# Patient Record
Sex: Female | Born: 1965 | Race: Black or African American | Hispanic: No | Marital: Married | Smoking: Former smoker
Health system: Southern US, Community
[De-identification: ages and names within clinical notes are randomized; demographics above are authoritative.]

## PROBLEM LIST (undated history)

## (undated) DIAGNOSIS — C44201 Unspecified malignant neoplasm of skin of unspecified ear and external auricular canal: Secondary | ICD-10-CM

## (undated) DIAGNOSIS — I639 Cerebral infarction, unspecified: Secondary | ICD-10-CM

## (undated) DIAGNOSIS — F419 Anxiety disorder, unspecified: Secondary | ICD-10-CM

## (undated) DIAGNOSIS — F32A Depression, unspecified: Secondary | ICD-10-CM

## (undated) DIAGNOSIS — D649 Anemia, unspecified: Secondary | ICD-10-CM

## (undated) DIAGNOSIS — M1990 Unspecified osteoarthritis, unspecified site: Secondary | ICD-10-CM

## (undated) DIAGNOSIS — J45909 Unspecified asthma, uncomplicated: Secondary | ICD-10-CM

## (undated) DIAGNOSIS — I10 Essential (primary) hypertension: Secondary | ICD-10-CM

## (undated) DIAGNOSIS — E119 Type 2 diabetes mellitus without complications: Secondary | ICD-10-CM

## (undated) DIAGNOSIS — J449 Chronic obstructive pulmonary disease, unspecified: Secondary | ICD-10-CM

## (undated) DIAGNOSIS — G4730 Sleep apnea, unspecified: Secondary | ICD-10-CM

## (undated) DIAGNOSIS — N289 Disorder of kidney and ureter, unspecified: Secondary | ICD-10-CM

## (undated) DIAGNOSIS — N281 Cyst of kidney, acquired: Secondary | ICD-10-CM

## (undated) DIAGNOSIS — K760 Fatty (change of) liver, not elsewhere classified: Secondary | ICD-10-CM

## (undated) HISTORY — PX: DILATION AND CURETTAGE OF UTERUS: SHX78

## (undated) HISTORY — PX: BACK SURGERY: SHX140

## (undated) HISTORY — PX: TUBAL LIGATION: SHX77

## (undated) HISTORY — PX: ENDOMETRIAL BIOPSY: SHX622

## (undated) HISTORY — PX: ABDOMINAL HYSTERECTOMY: SHX81

---
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
## 1990-11-22 HISTORY — PX: EXPLORATORY LAPAROTOMY: SUR591

## 1996-11-22 HISTORY — PX: HAND SURGERY: SHX662

## 2004-09-17 ENCOUNTER — Ambulatory Visit: Payer: Self-pay | Admitting: Orthopedic Surgery

## 2005-01-23 ENCOUNTER — Emergency Department: Payer: Self-pay | Admitting: General Practice

## 2005-02-15 ENCOUNTER — Emergency Department: Payer: Self-pay | Admitting: General Practice

## 2005-02-16 ENCOUNTER — Ambulatory Visit: Payer: Self-pay | Admitting: General Practice

## 2005-02-18 ENCOUNTER — Emergency Department: Payer: Self-pay | Admitting: Emergency Medicine

## 2005-02-24 ENCOUNTER — Ambulatory Visit: Payer: Self-pay

## 2005-04-30 ENCOUNTER — Emergency Department: Payer: Self-pay | Admitting: General Practice

## 2005-07-22 ENCOUNTER — Ambulatory Visit: Payer: Self-pay

## 2005-11-11 ENCOUNTER — Emergency Department: Payer: Self-pay | Admitting: Emergency Medicine

## 2005-11-29 ENCOUNTER — Emergency Department: Payer: Self-pay | Admitting: Unknown Physician Specialty

## 2005-11-29 IMAGING — US US EXTREM LOW VENOUS*R*
1 series · 17 of 24 positions shown · non-contrast
Comparison: none

REASON FOR EXAM: Right calf pain
COMMENTS:

[Series 1: us extrem low venous*right* · 17 of 27 slices shown]
[im 1/27]
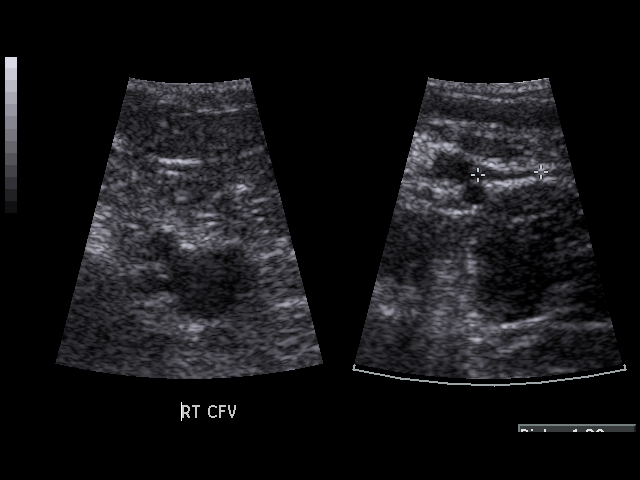
[im 3/27]
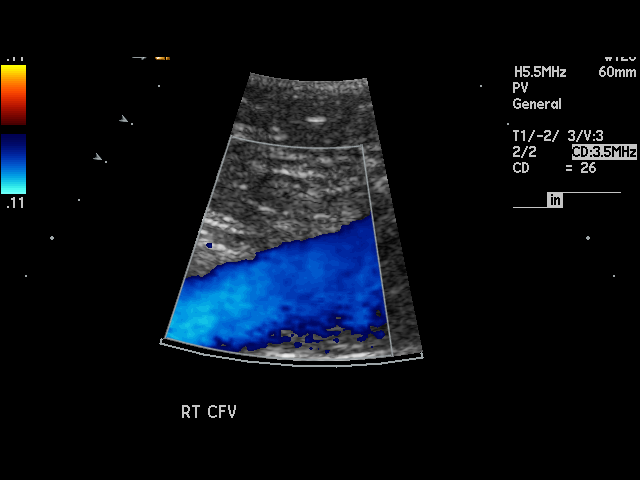
[im 4/27]
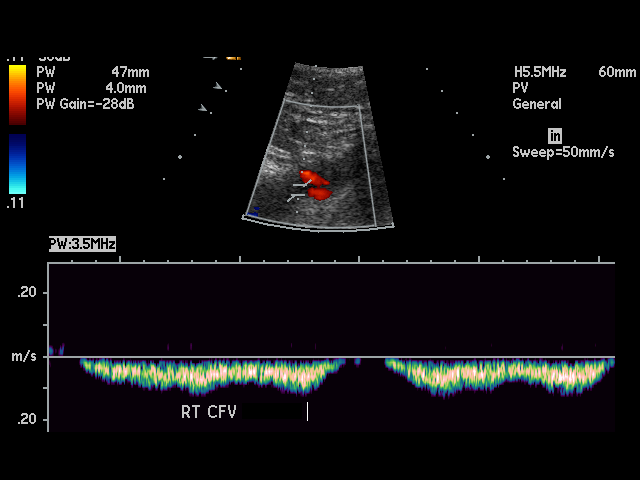
[im 5/27]
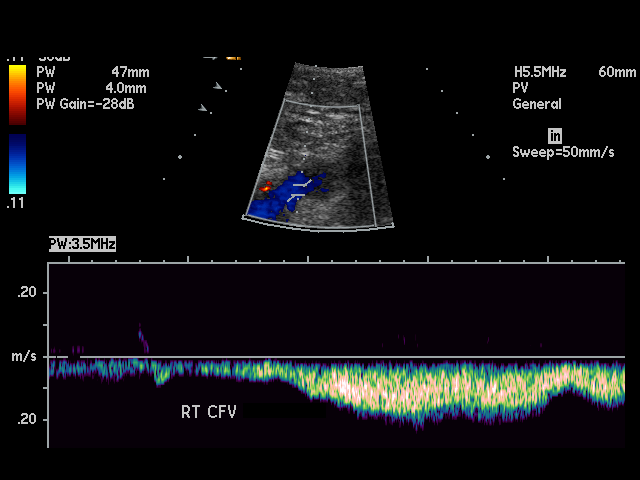
[im 7/27]
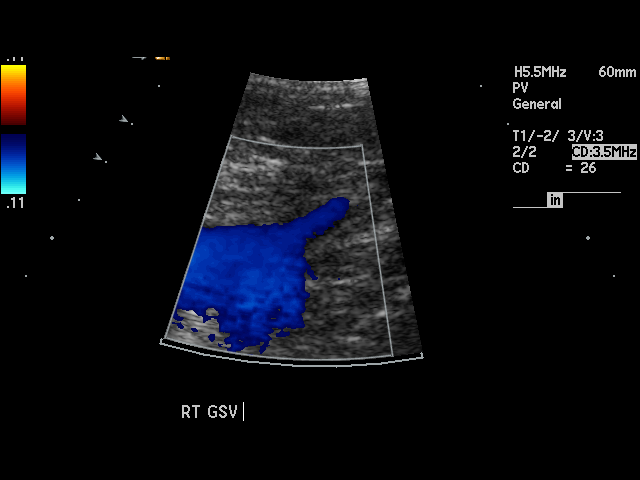
[im 8/27]
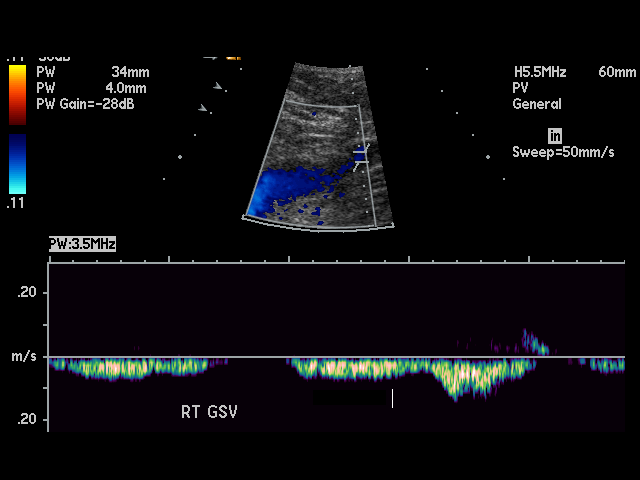
[im 11/27]
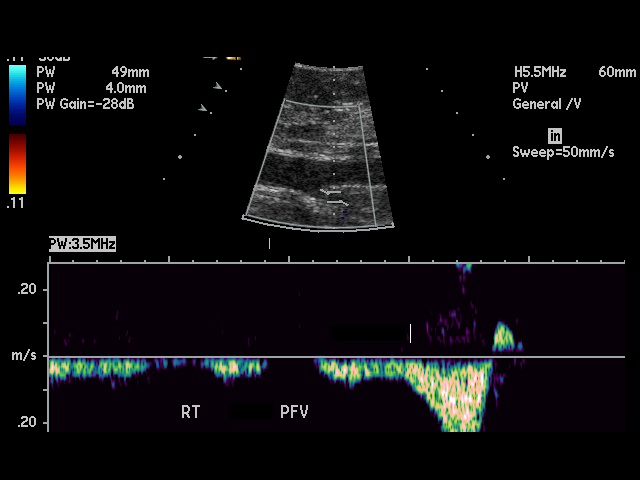
[im 12/27]
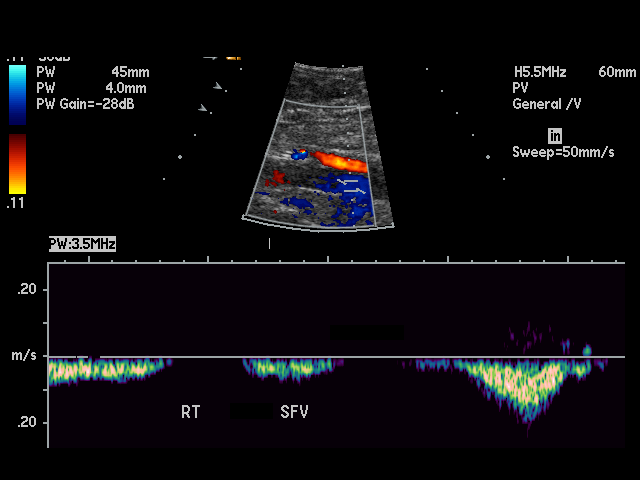
[im 14/27]
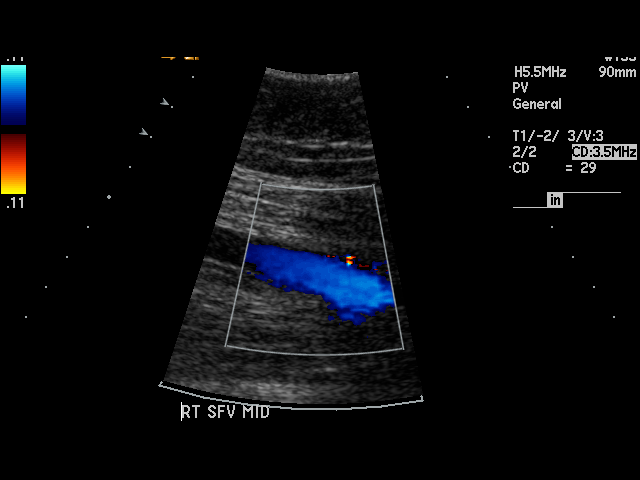
[im 15/27]
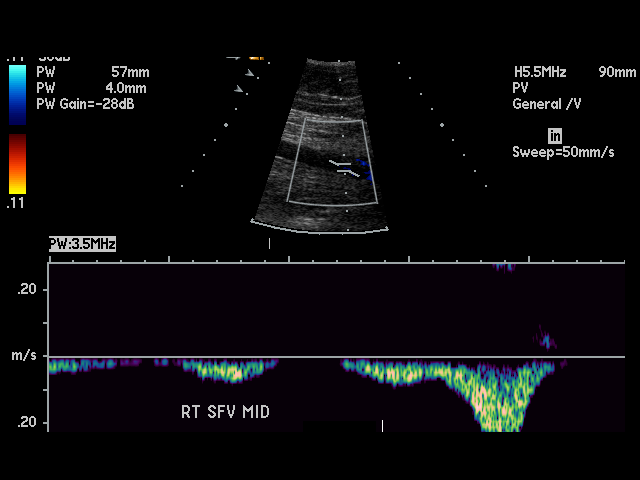
[im 16/27]
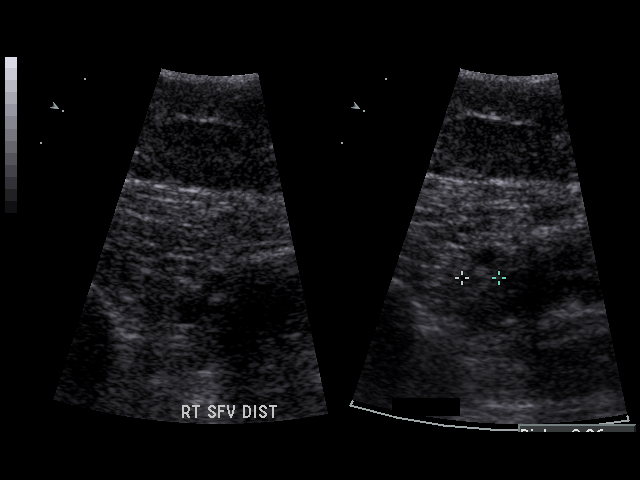
[im 19/27]
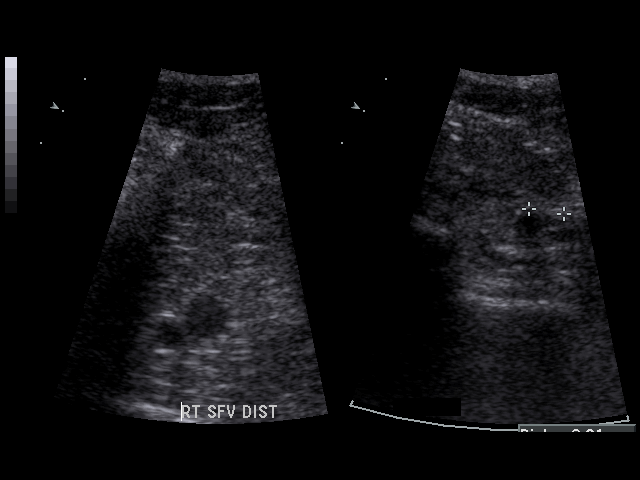
[im 20/27]
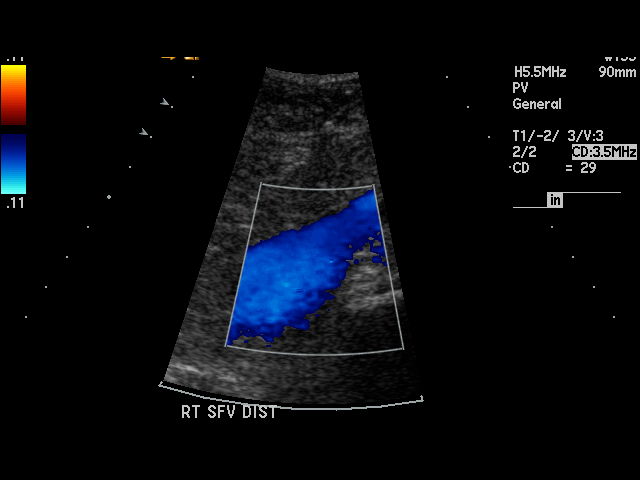
[im 22/27]
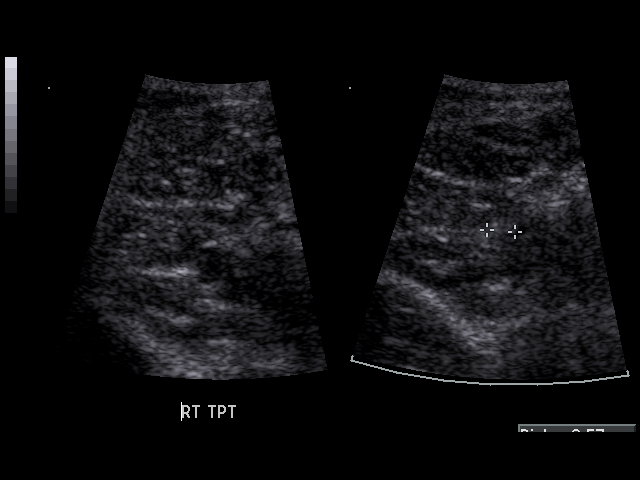
[im 23/27]
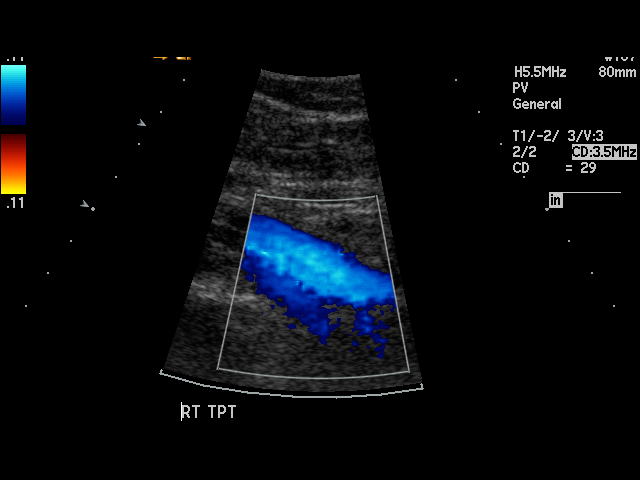
[im 24/27]
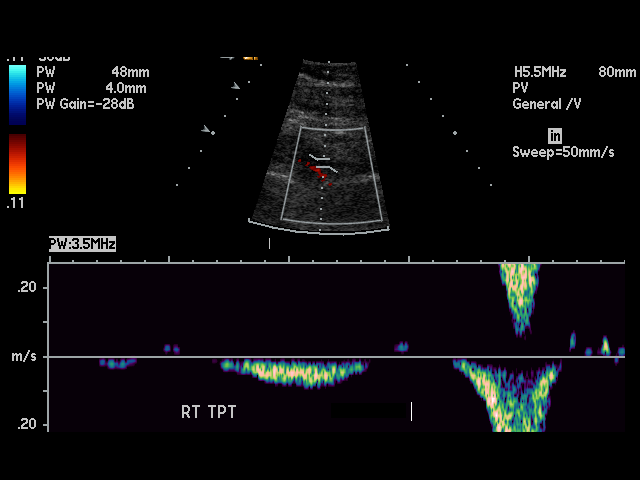
[im 27/27]
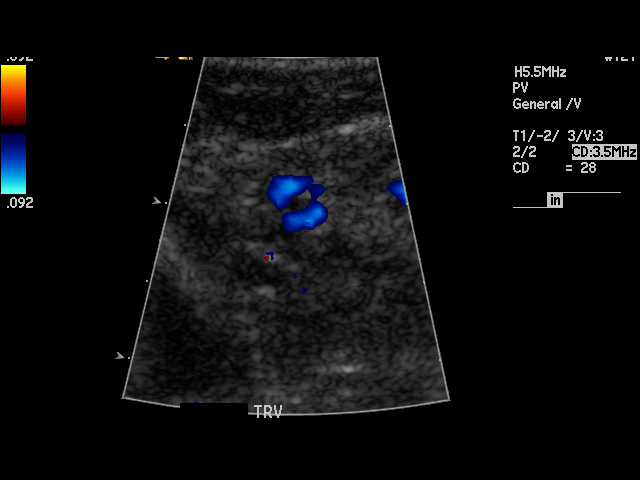

[17 of 24 positions shown; findings below may reference images not displayed]

PROCEDURE:     US  - US DOPPLER LOW EXTR RIGHT  - February 16, 2005 [DATE]

RESULT:     Color flow Doppler study of the RIGHT lower extremity is
performed.

There is noted flow throughout the deep venous system. The veins are
compressible and respond appropriately to augmentation. No clot is
identified.
IMPRESSION: No evidence of deep venous thrombosis in the RIGHT lower
extremity.

## 2005-12-02 ENCOUNTER — Ambulatory Visit: Payer: Self-pay | Admitting: Family Medicine

## 2005-12-07 IMAGING — CR DG KNEE COMPLETE 4+V*R*
1 series · 4 of 4 positions shown · non-contrast
Comparison: none

REASON FOR EXAM: Right knee pain and swelling
COMMENTS:

[Series 1: view not recorded · 0.17mm/px · 4 of 4 slices shown]
[im 1/4]
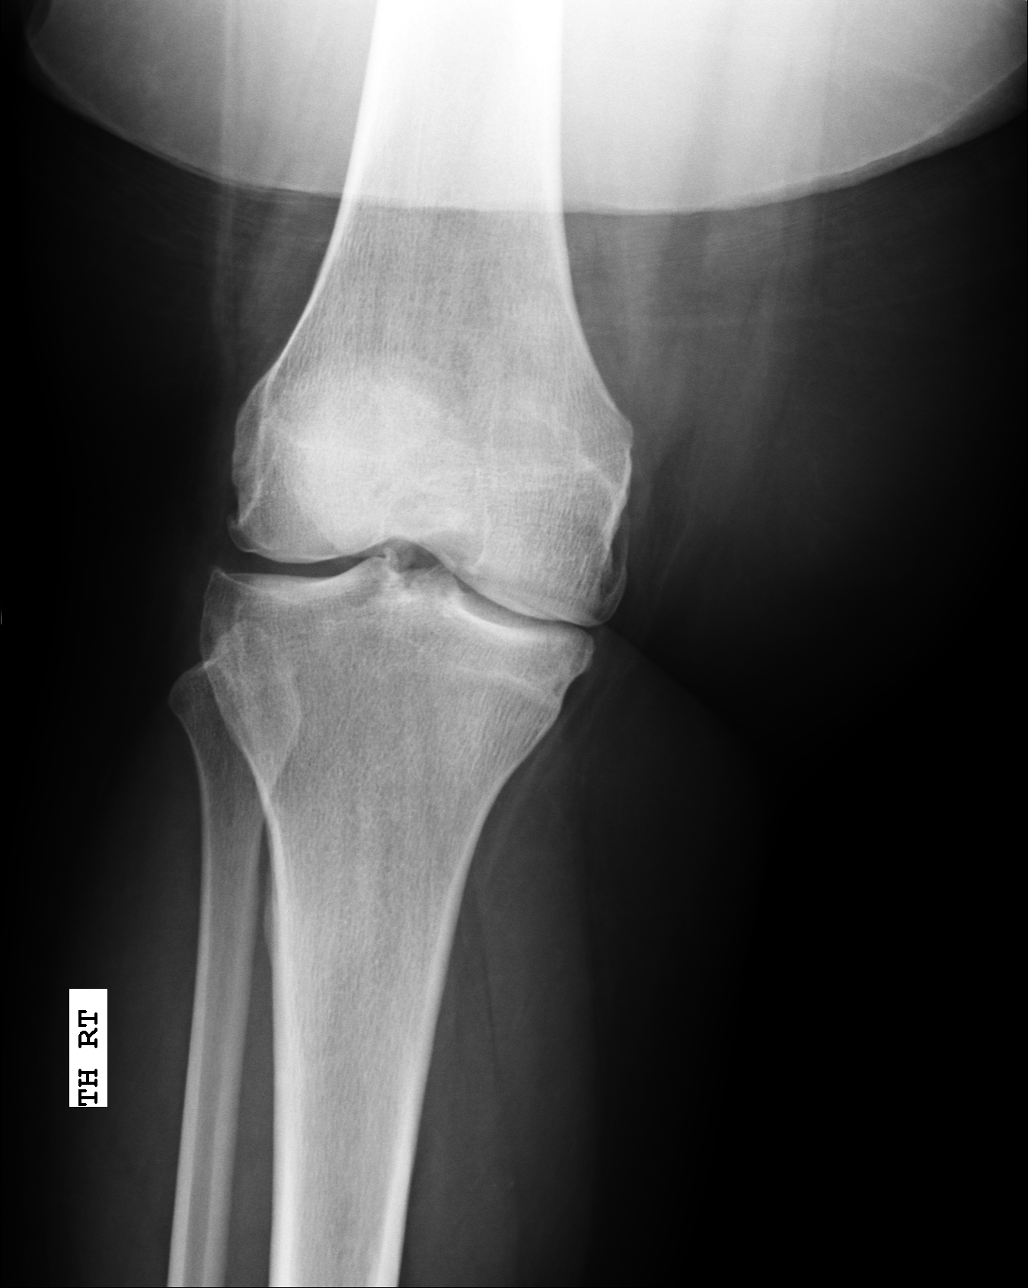
[im 2/4]
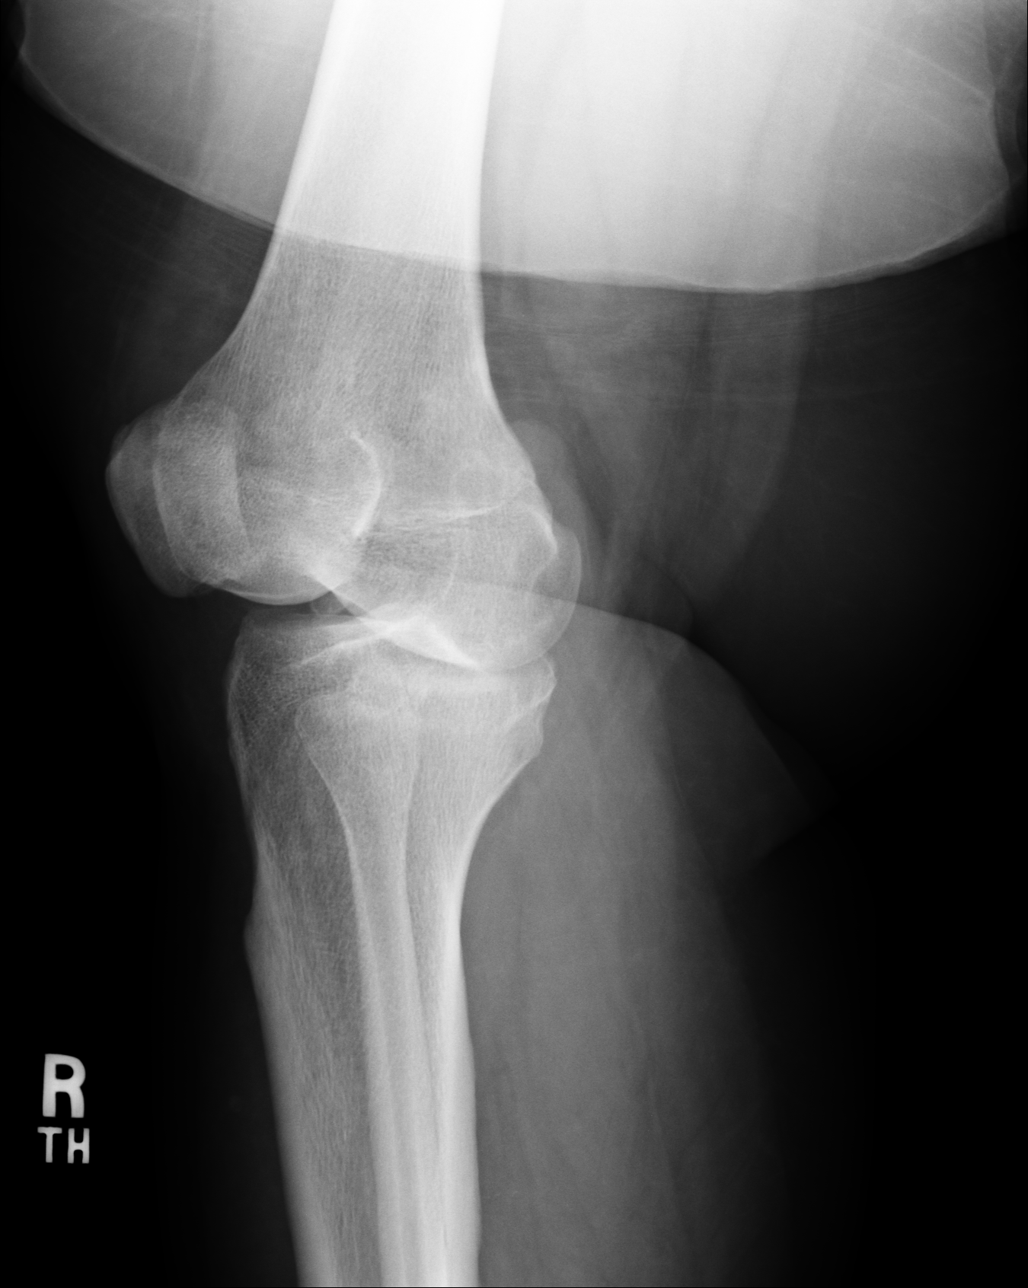
[im 3/4]
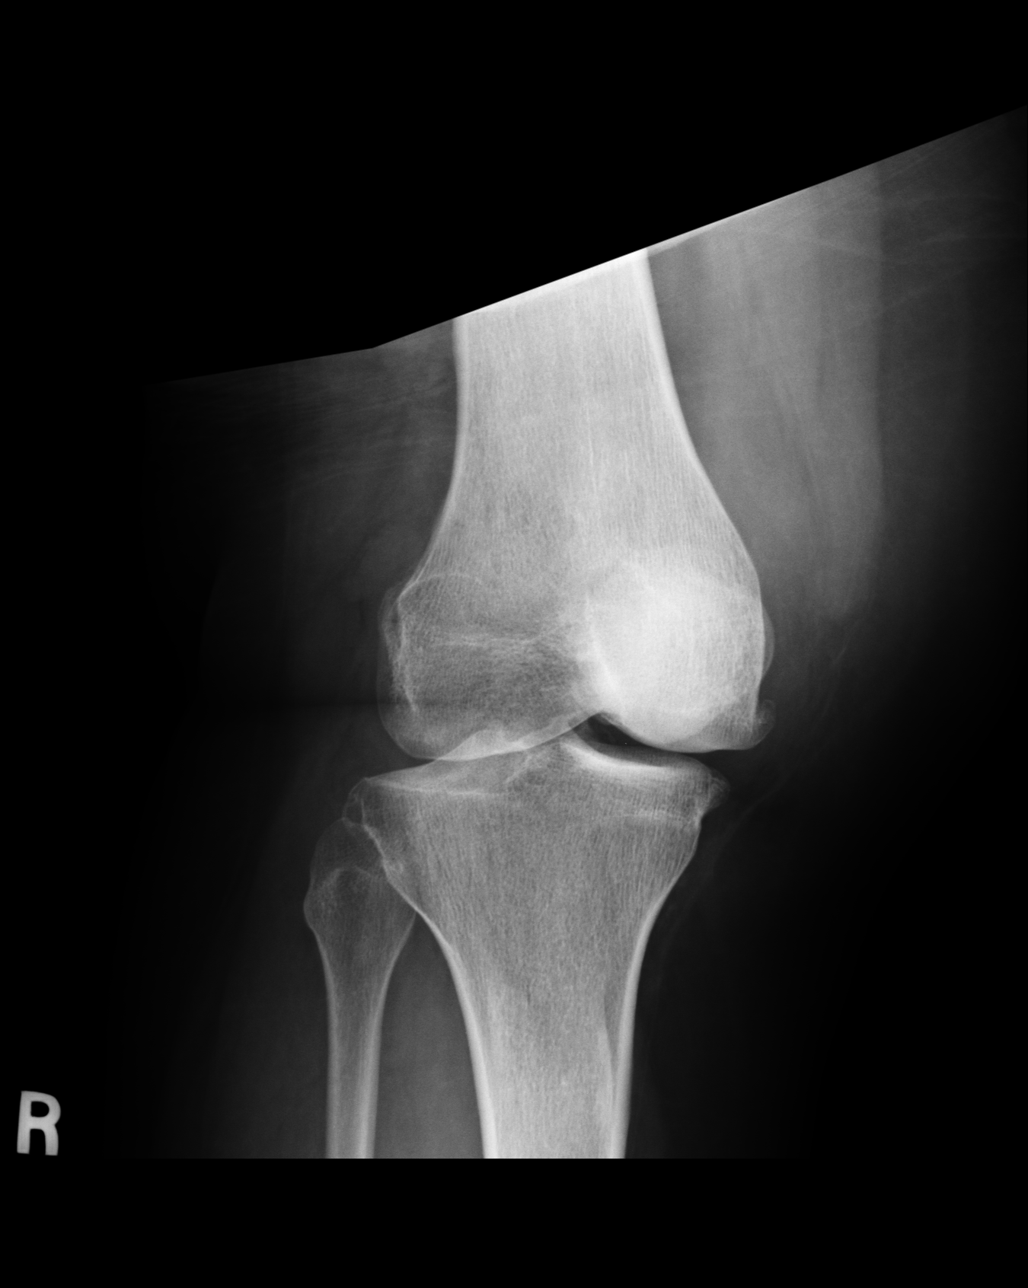
[im 4/4]
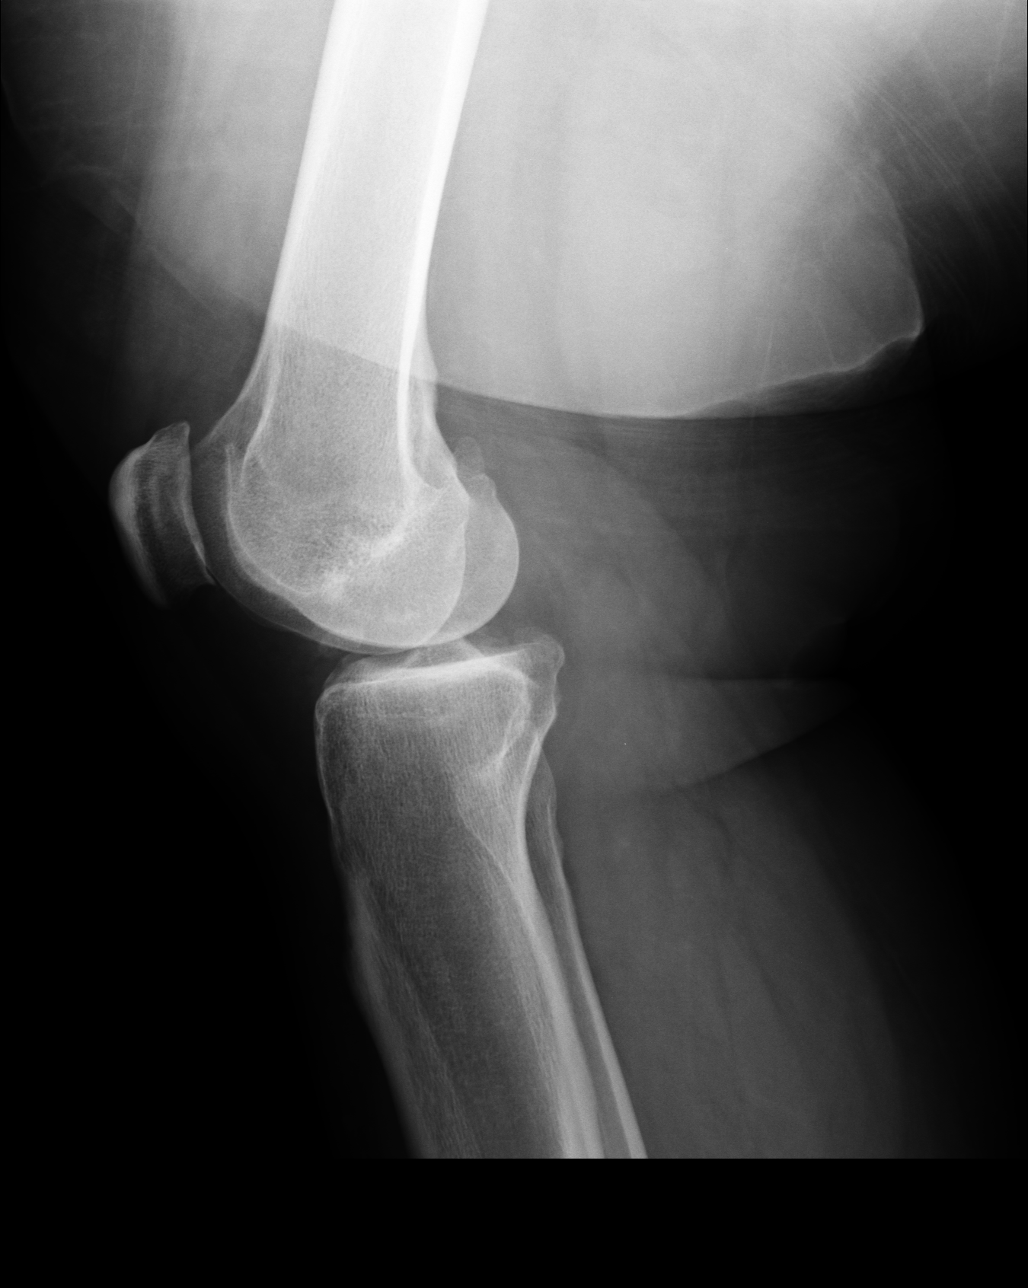

[4 of 4 positions shown; findings below may reference images not displayed]

PROCEDURE:     DXR - DXR KNEE RT COMP WITH OBLIQUES  - February 24, 2005 [DATE]

RESULT:     Multiple views of the RIGHT knee show arthritic narrowing of the
joint spaces medially and laterally, sclerotic changes on the medial and
lateral tibial plateaus and peaking of the tibial spines. No fracture,
foreign body or soft tissue swelling is identified.
IMPRESSION: Arthritic changes in the RIGHT knee without evidence of an
acute traumatic abnormality.

## 2005-12-31 ENCOUNTER — Ambulatory Visit: Payer: Self-pay

## 2006-05-04 IMAGING — CT CT NECK WITH CONTRAST
2 series · 10 of 14 positions shown, 12 images · non-contrast
Comparison: none

REASON FOR EXAM: left ear pain hx of basal cell ca of the left ear tragus
COMMENTS:

[Series 2: soft tissue · axial · 0.42mm/px · z∈[+84,+280]mm · 8 of 85 slices shown, 10 images]
[im 10/85  soft-tissue]
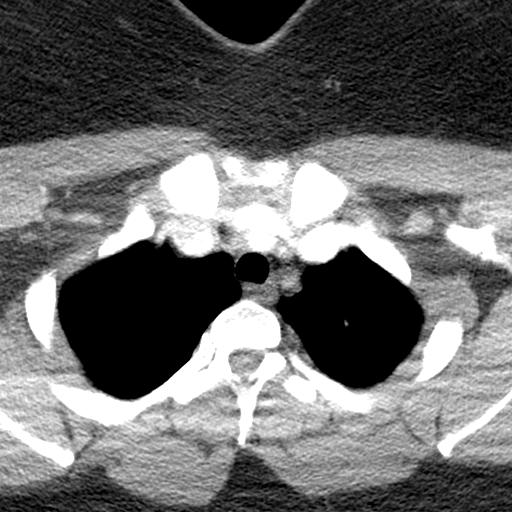
[im 10/85  bone]
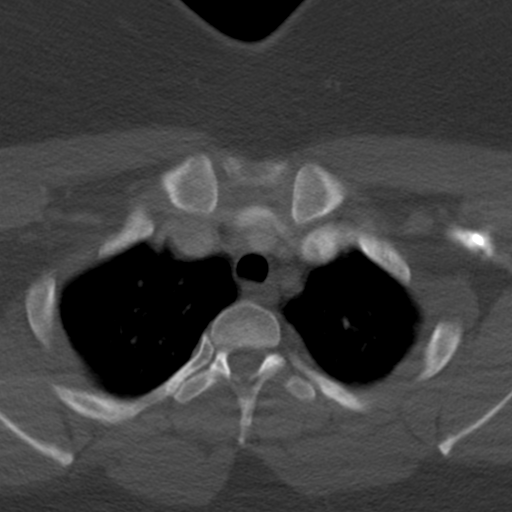
[im 19/85  bone]
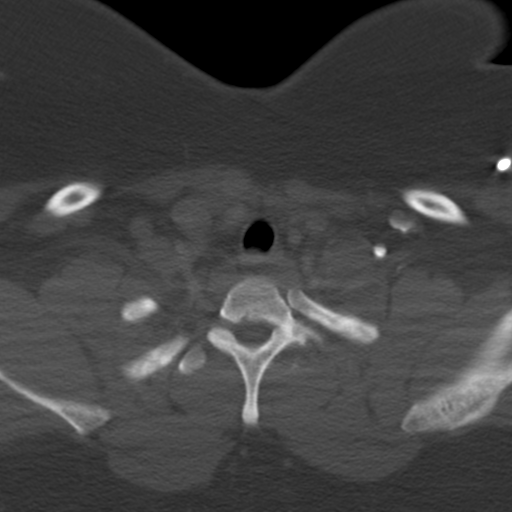
[im 29/85  bone]
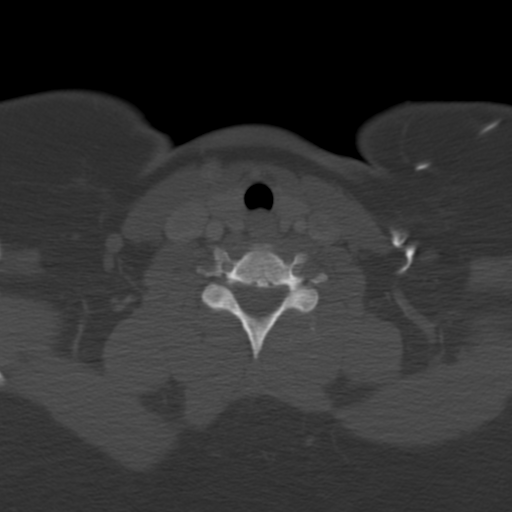
[im 38/85  bone]
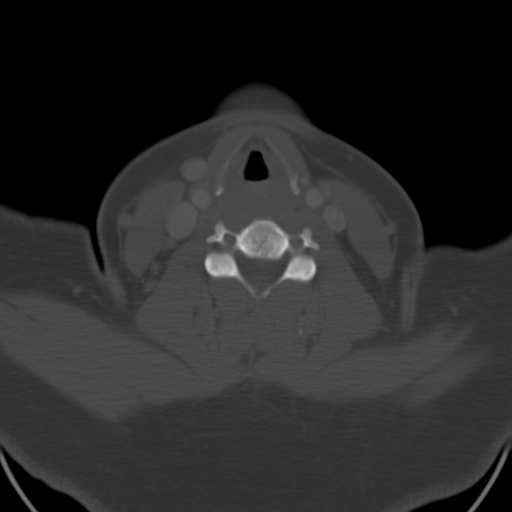
[im 47/85  soft-tissue]
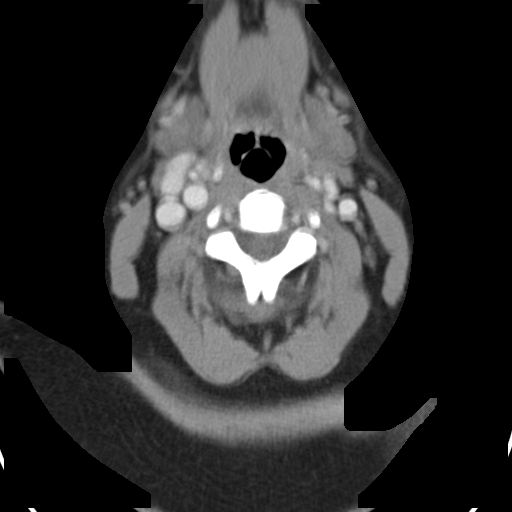
[im 47/85  bone]
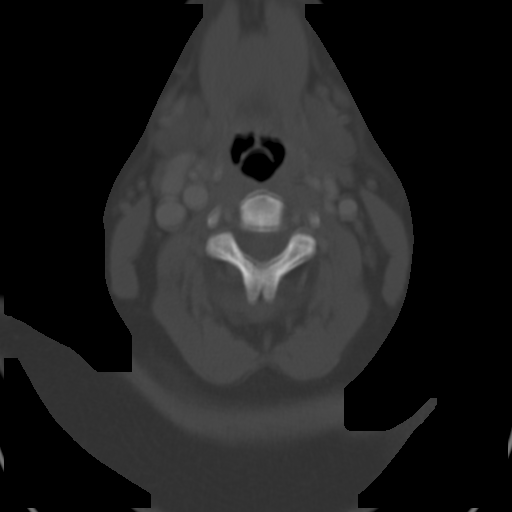
[im 57/85  bone]
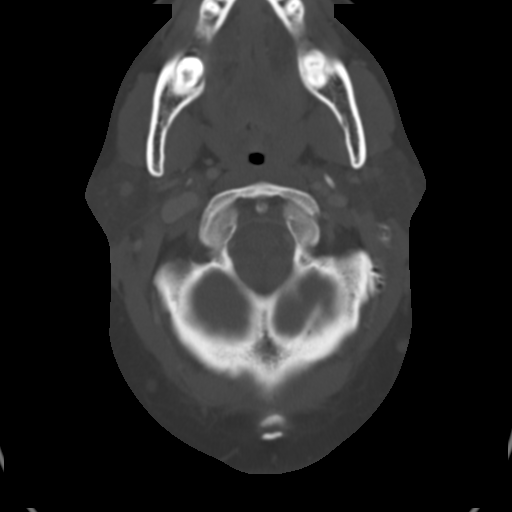
[im 66/85  bone]
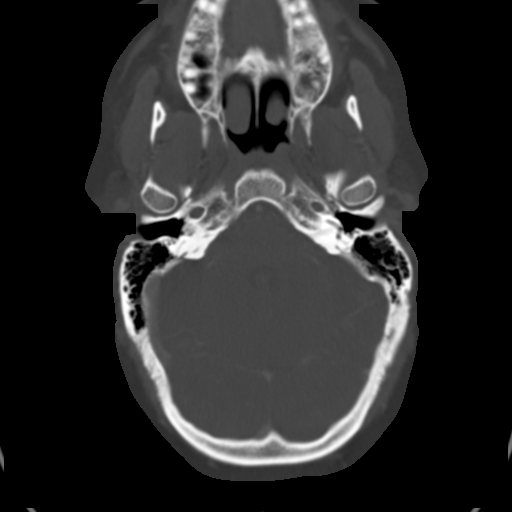
[im 75/85  bone]
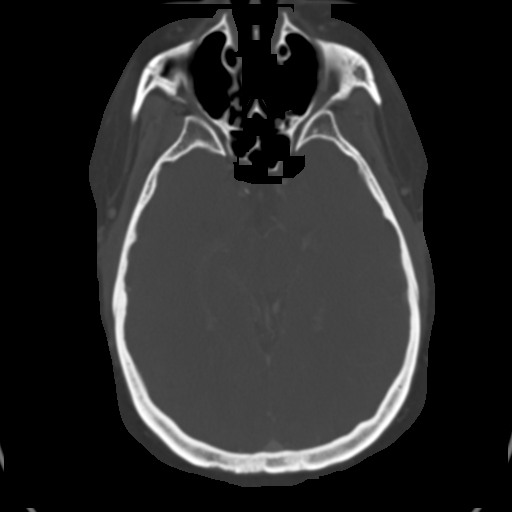

[Series 4: lung windows · axial · 0.56mm/px · z∈[+94,+130]mm · 2 of 37 slices shown]
[im 13/37  bone]
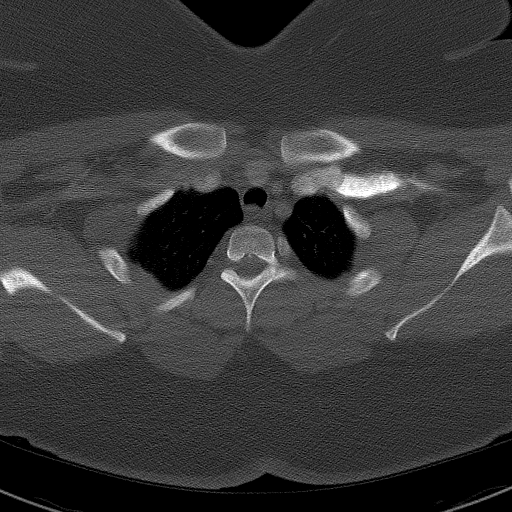
[im 25/37  bone]
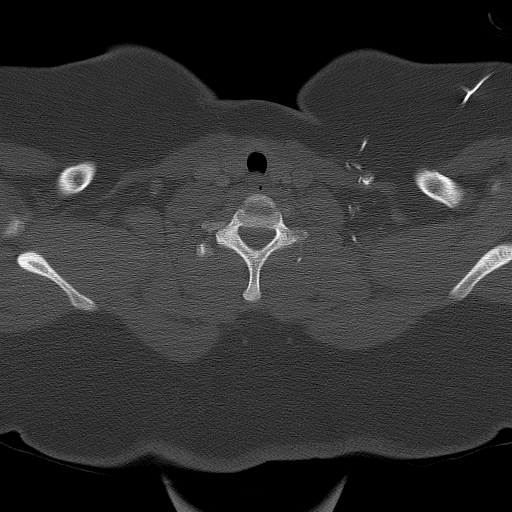

[10 of 14 positions shown; findings below may reference images not displayed]

PROCEDURE:     CT  - CT NECK WITH CONTRAST  - July 22, 2005  [DATE]

RESULT:     IV contrast enhanced CT of the neck is performed.  The patient
has a history of basal cell carcinoma of the tragus of the LEFT ear.  There
are some mildly pronounced subcutaneous posterior cervical lymph nodes with
one seen on image #27 measuring approximately 8 mm in diameter.  The parotid
glands appear to enhance normally and show no definite focal masses.  The
arytenoid cartilage appears intact bilaterally.  The laryngeal and
peripharyngeal regions appear to be unremarkable.  The submandibular glands
appear to be symmetric.  A submandibular lymph node on the LEFT measures up
to approximately 9-10 mm in maximal length.  Some additional lymph nodes are
seen on the RIGHT but are not significantly different in size from those on
the LEFT.  There are also some less prominent shotty lymph nodes present
bilaterally.
IMPRESSION: Some scattered, borderline enlarged lymph nodes.  A discrete mass is not
appreciated.  In the area of the tragus no definite infiltrating process can
be identified.

## 2006-10-13 IMAGING — NM NUCLEAR MEDICINE HEPATOHBILIARY INCLUDE GB
2 series · 12 of 12 positions shown · non-contrast
Comparison: none

REASON FOR EXAM: ABNORMAL U/S ON [DATE] AT [HOSPITAL],    HYPERECHOIC LIVER
COMMENTS:

[Series 1: gallbladder dynamic (results) · 4.80mm/px · 6 of 60 frames shown]
[frame 6/60]
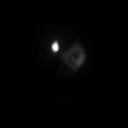
[frame 16/60]
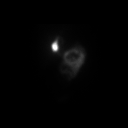
[frame 26/60]
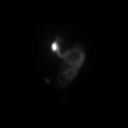
[frame 36/60]
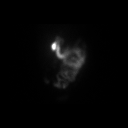
[frame 46/60]
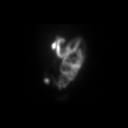
[frame 56/60]
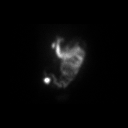

[Series 1: gallbladder dynamic · 4.80mm/px · 6 of 60 frames shown]
[frame 6/60]
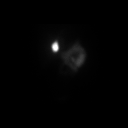
[frame 16/60]
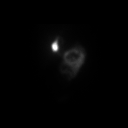
[frame 26/60]
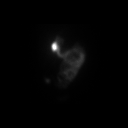
[frame 36/60]
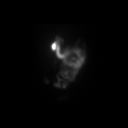
[frame 46/60]
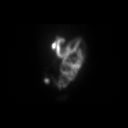
[frame 56/60]
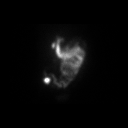

[12 of 12 positions shown; findings below may reference images not displayed]

PROCEDURE:     NM  - NM HEPATO WITH GB EJECT FRACTION  - December 31, 2005  [DATE]

RESULT:     Following injection of 7.53 mCi of Technetium 99m Choletec,
there is noted prompt visualization of tracer activity in the liver at 5
minutes. Tracer activity is seen in the gallbladder and proximal small bowel
at 50 minutes.

The gallbladder ejection fraction at 30 minutes measures 71% which is in the
normal range.
IMPRESSION: 1.     Normal Hepatobiliary Scan.
2.     Normal gallbladder ejection fraction.

## 2007-01-02 ENCOUNTER — Emergency Department: Payer: Self-pay | Admitting: Unknown Physician Specialty

## 2007-02-28 ENCOUNTER — Emergency Department: Payer: Self-pay | Admitting: Emergency Medicine

## 2007-05-31 ENCOUNTER — Ambulatory Visit: Payer: Self-pay

## 2007-06-01 ENCOUNTER — Ambulatory Visit: Payer: Self-pay

## 2007-10-15 IMAGING — US US EXTREM LOW VENOUS*R*
1 series · 17 of 24 positions shown · non-contrast
Comparison: none

REASON FOR EXAM: pain  high ddimer and uric acid,  mc rm 1
COMMENTS:

PROCEDURE:     US  - US DOPPLER LOW EXTR RIGHT  - January 02, 2007 [DATE]
RESULT:     The RIGHT femoral and popliteal veins are normally compressible.
 The waveform patterns are normal and the color flow images are normal.

[Series 1: us extrem low venous*right* · 17 of 25 slices shown]
[im 1/25]
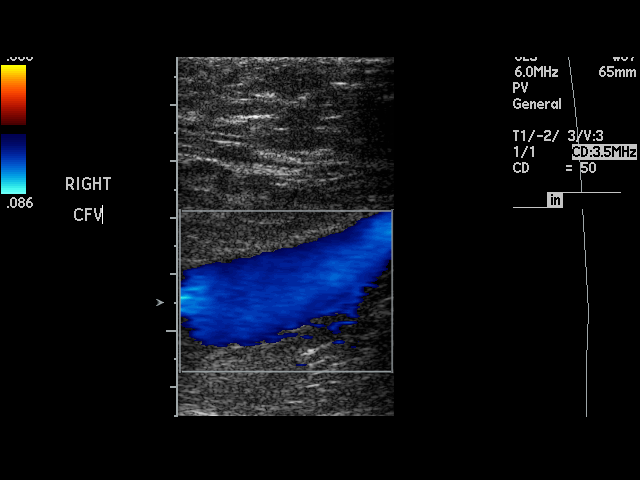
[im 3/25]
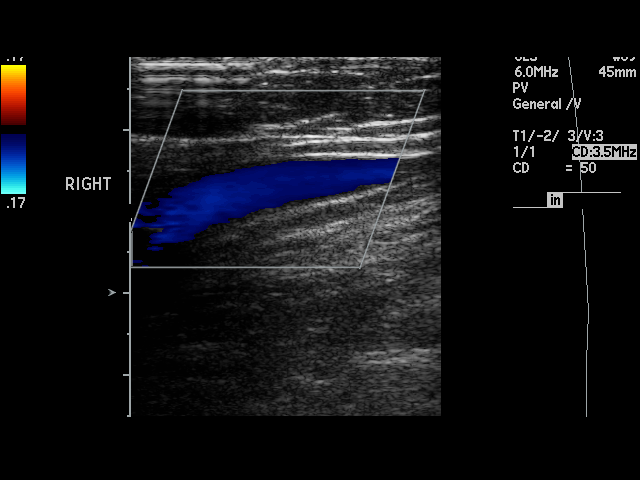
[im 4/25]
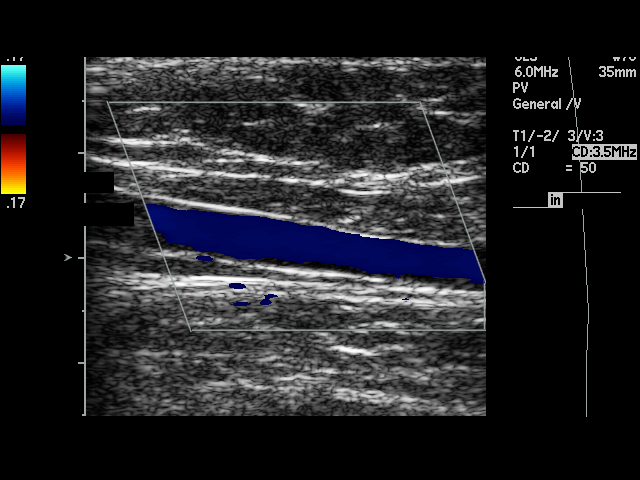
[im 5/25]
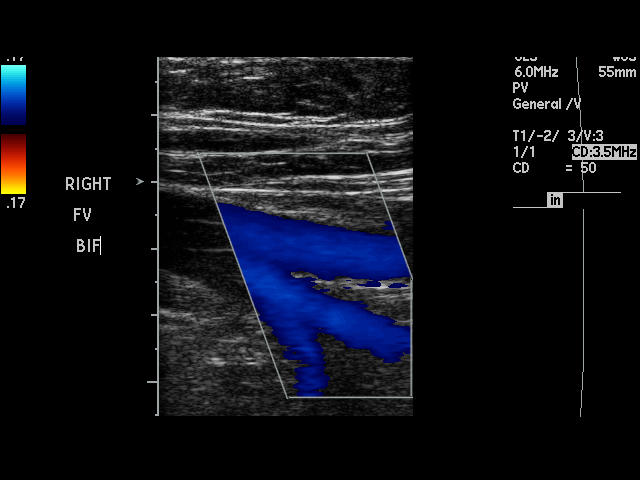
[im 7/25]
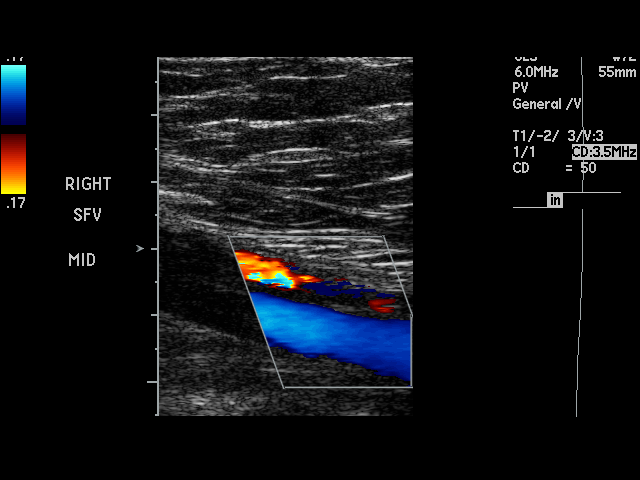
[im 8/25]
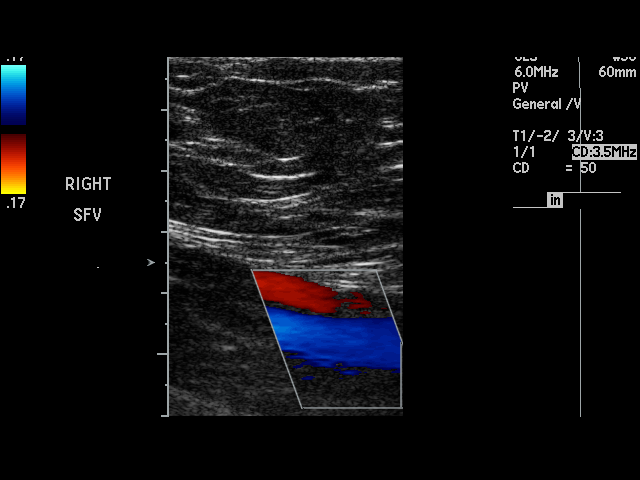
[im 10/25]
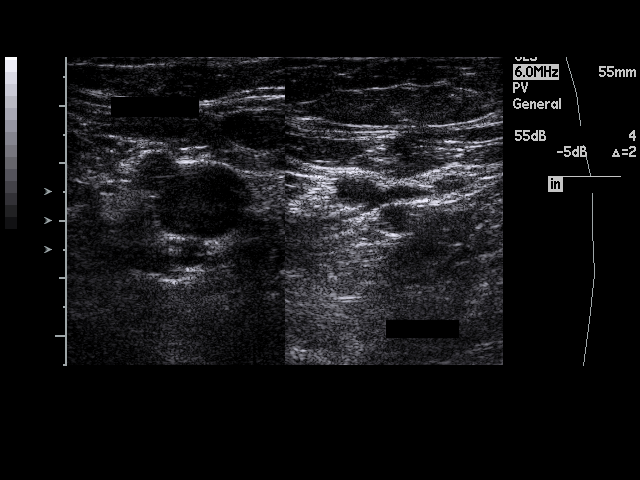
[im 11/25]
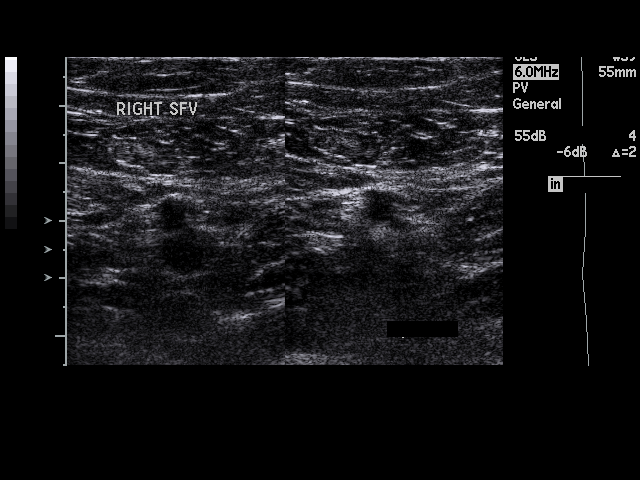
[im 13/25]
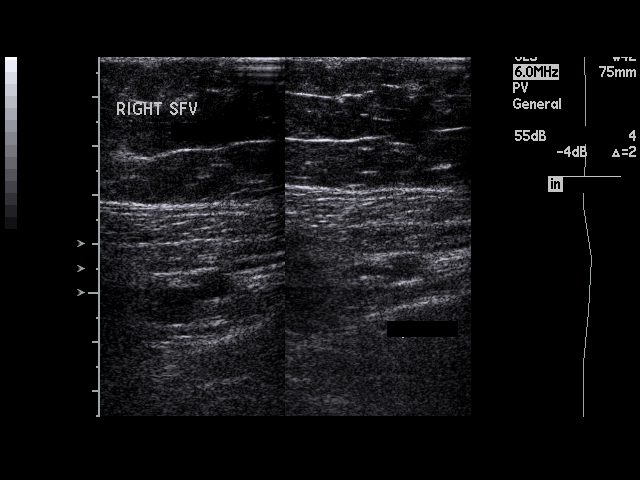
[im 14/25]
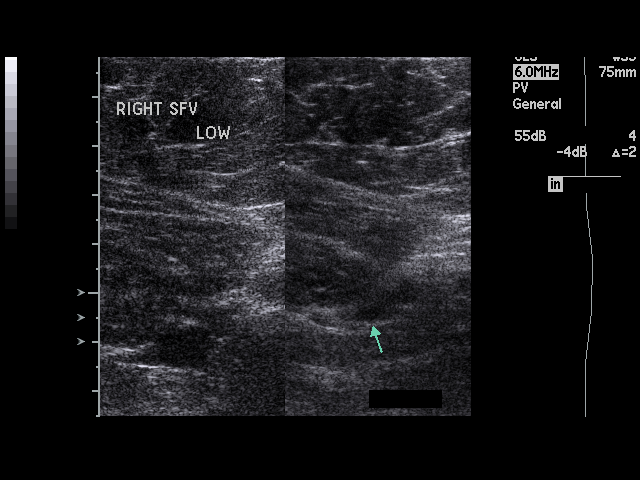
[im 15/25]
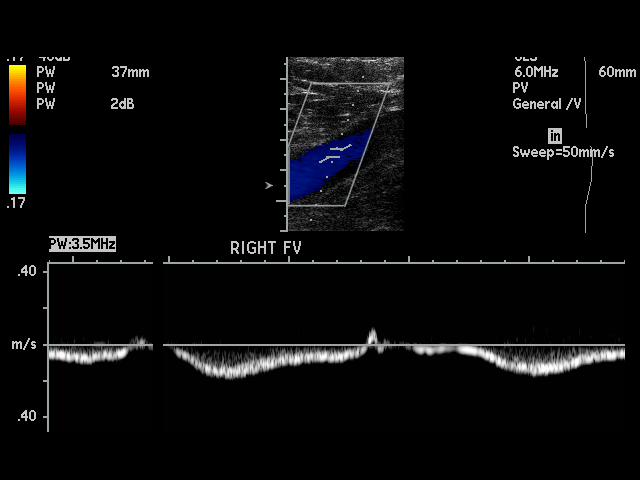
[im 17/25]
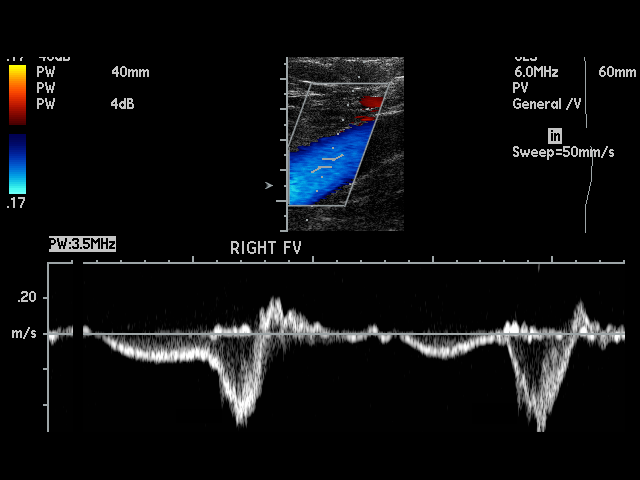
[im 18/25]
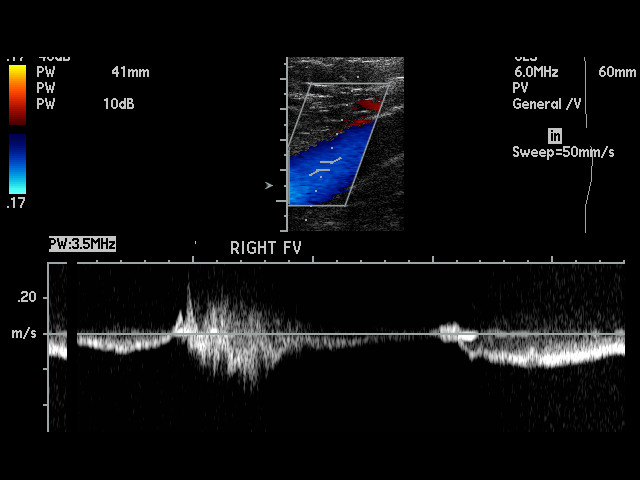
[im 20/25]
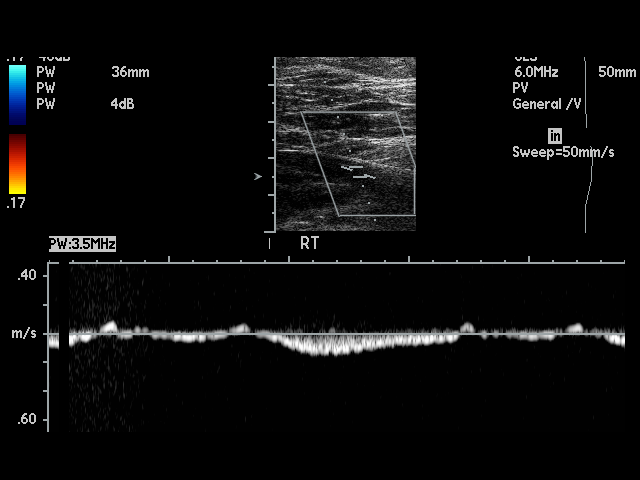
[im 21/25]
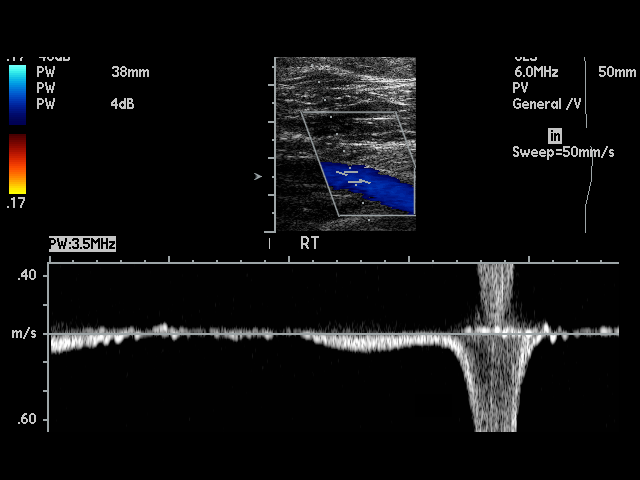
[im 22/25]
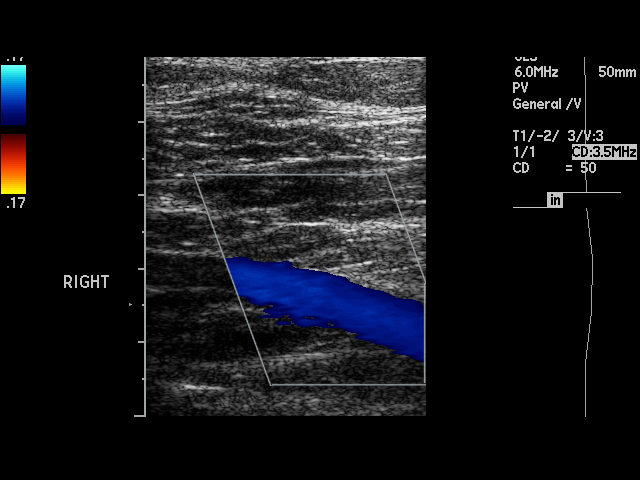
[im 25/25]
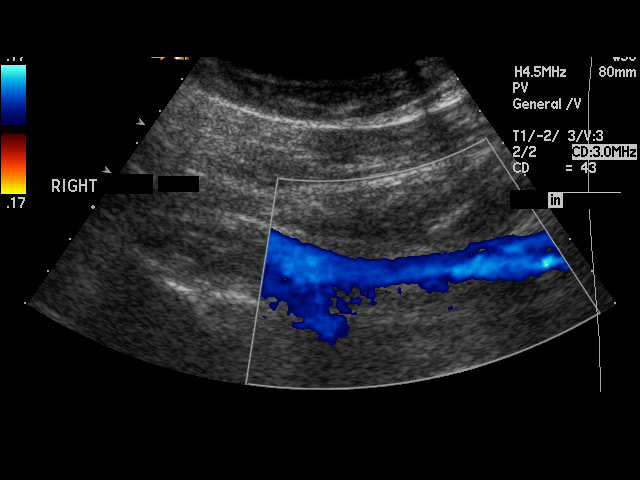

[17 of 24 positions shown; findings below may reference images not displayed]

IMPRESSION: 1)There is no evidence of thrombus within the RIGHT femoral or popliteal
veins.

A preliminary report was sent to the Emergency Room at the conclusion of the
study.

## 2007-10-25 ENCOUNTER — Ambulatory Visit: Payer: Self-pay | Admitting: Family Medicine

## 2007-11-23 ENCOUNTER — Ambulatory Visit: Payer: Self-pay | Admitting: Family Medicine

## 2007-12-04 ENCOUNTER — Emergency Department: Payer: Self-pay | Admitting: Emergency Medicine

## 2007-12-11 IMAGING — CR DG KNEE 1-2V*R*
1 series · 2 of 2 positions shown · non-contrast
Comparison: none

REASON FOR EXAM: atraumatic joint pain
COMMENTS:

[Series 1: view not recorded · 0.17mm/px · 2 of 2 slices shown]
[im 1/2]
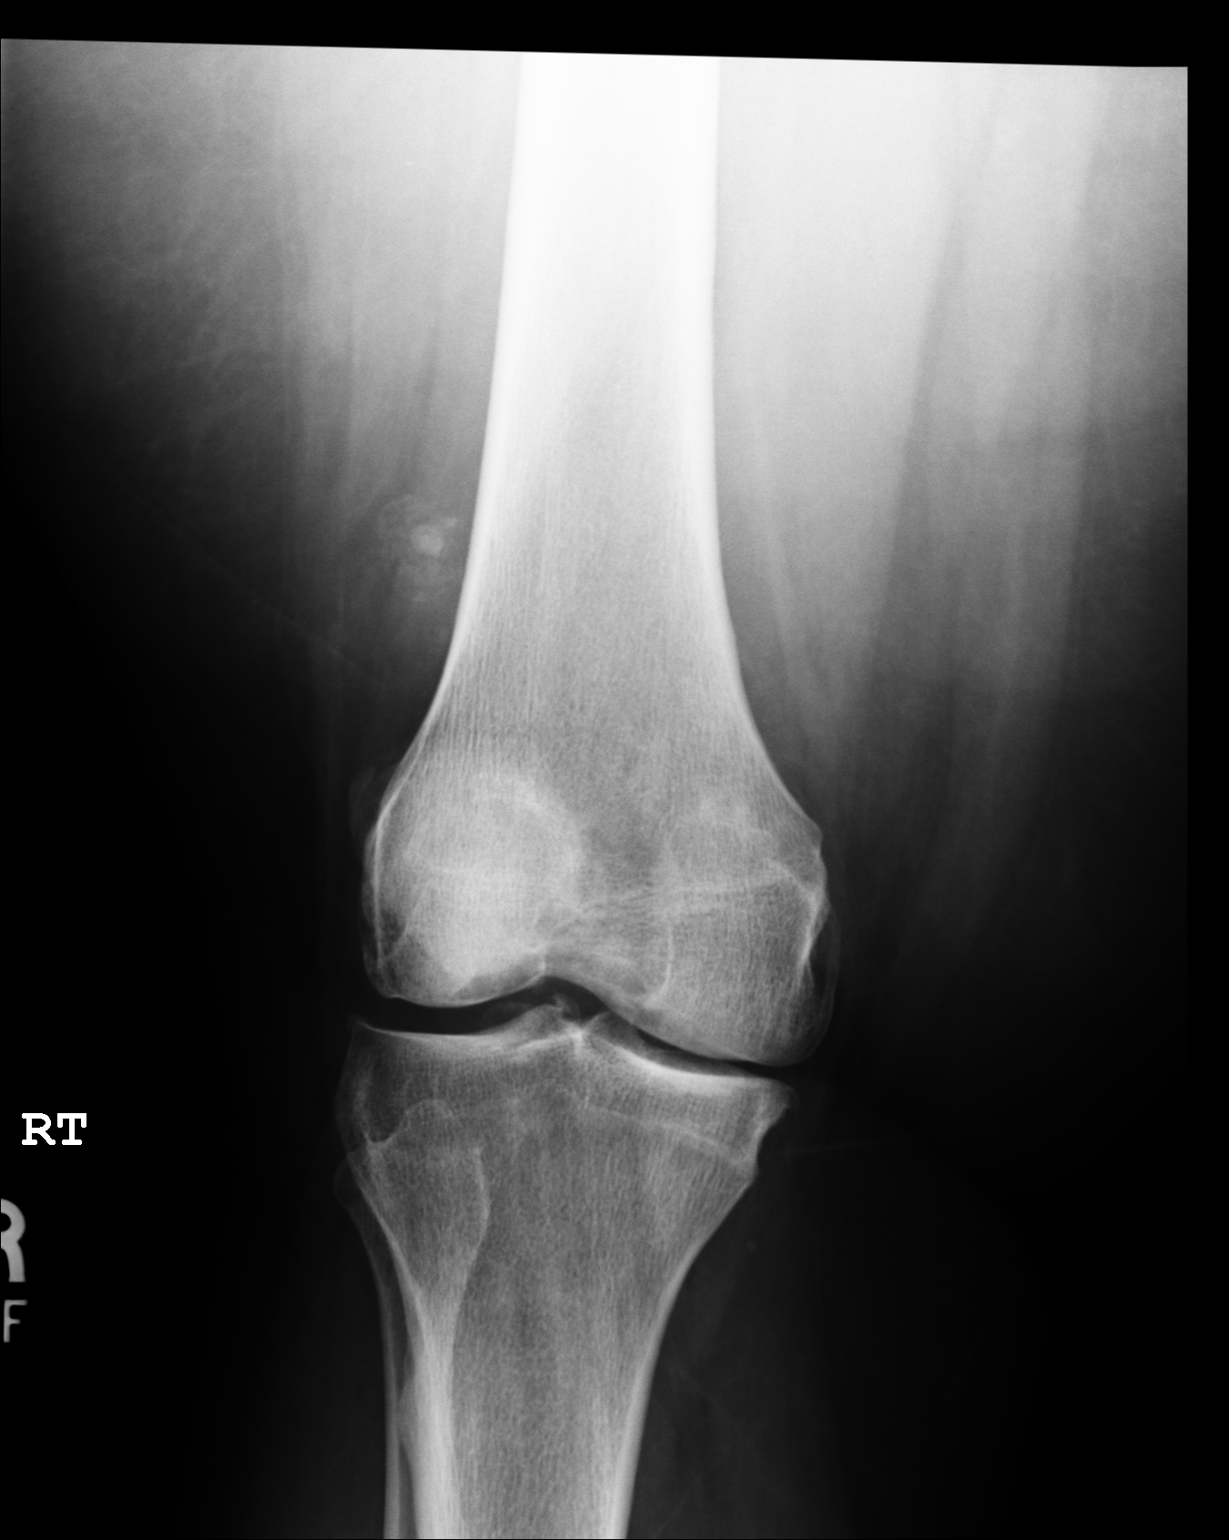
[im 2/2]
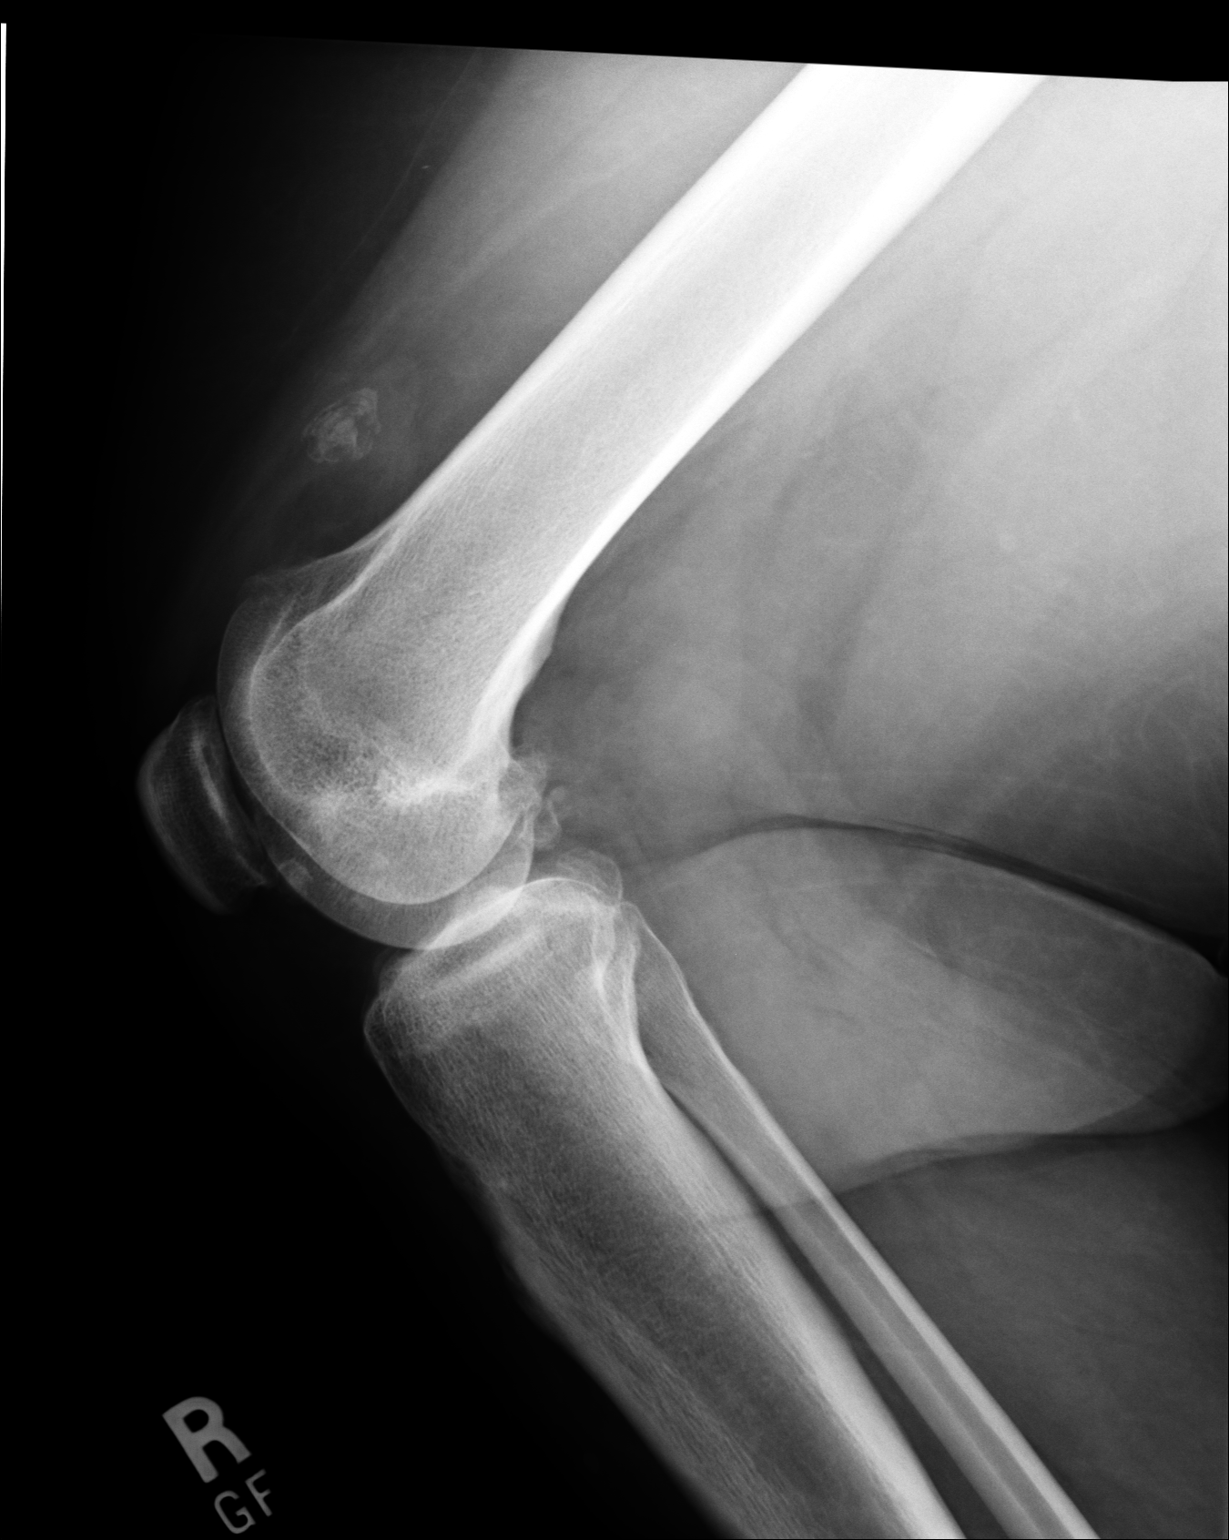

[2 of 2 positions shown; findings below may reference images not displayed]

PROCEDURE:     DXR - DXR KNEE RIGHT AP AND LATERAL  - February 28, 2007  [DATE]

RESULT:     Comparison is made to prior examinations of 11/29/2005 and
02/24/2005. No fracture or dislocation is seen. There is slight degenerative
spurring medially and laterally at the knee. The knee joint space appears
slightly narrowed along its medial aspect suspicious for minimal arthritic
change. The patella is intact. There is a 1.9 cm soft tissue calcification
projected over the anterior aspect of the distal RIGHT femur approximately
2.0 to 4.0 cm above the patella. The origin of this soft tissue
calcification is to me uncertain. This appears to have for the most part
developed in the interval since the exam of 02/24/2005. There is no associated
bony abnormality observed involving the distal femur. This likely represents
benign soft tissue calcification of some type and possibly secondary to
prior trauma. Nonetheless since the etiology for this calcification is
unclear and since the patient apparently has an ongoing history of pain
about the knee, it is recommended that the patient have a repeat RIGHT knee
MRI examination.
IMPRESSION: 1. No fracture is seen.
2. Mild arthritic change about the knee is noted.
3. There is soft tissue calcification anterior to the distal femur. It is
recommended that in view of the patient's history of ongoing pain, that
repeat MRI examination be performed.

## 2007-12-11 IMAGING — CR RIGHT ELBOW - 2 VIEW
1 series · 2 of 2 positions shown · non-contrast
Comparison: none

REASON FOR EXAM: Atraumatic pain
COMMENTS:

PROCEDURE:     DXR - DXR ELBOW RT AP AND LATERAL  - February 28, 2007  [DATE]
RESULT:     AP and lateral views show no fracture, dislocation or other
acute bony abnormality.

[Series 1: view not recorded · 0.17mm/px · 2 of 2 slices shown]
[im 1/2]
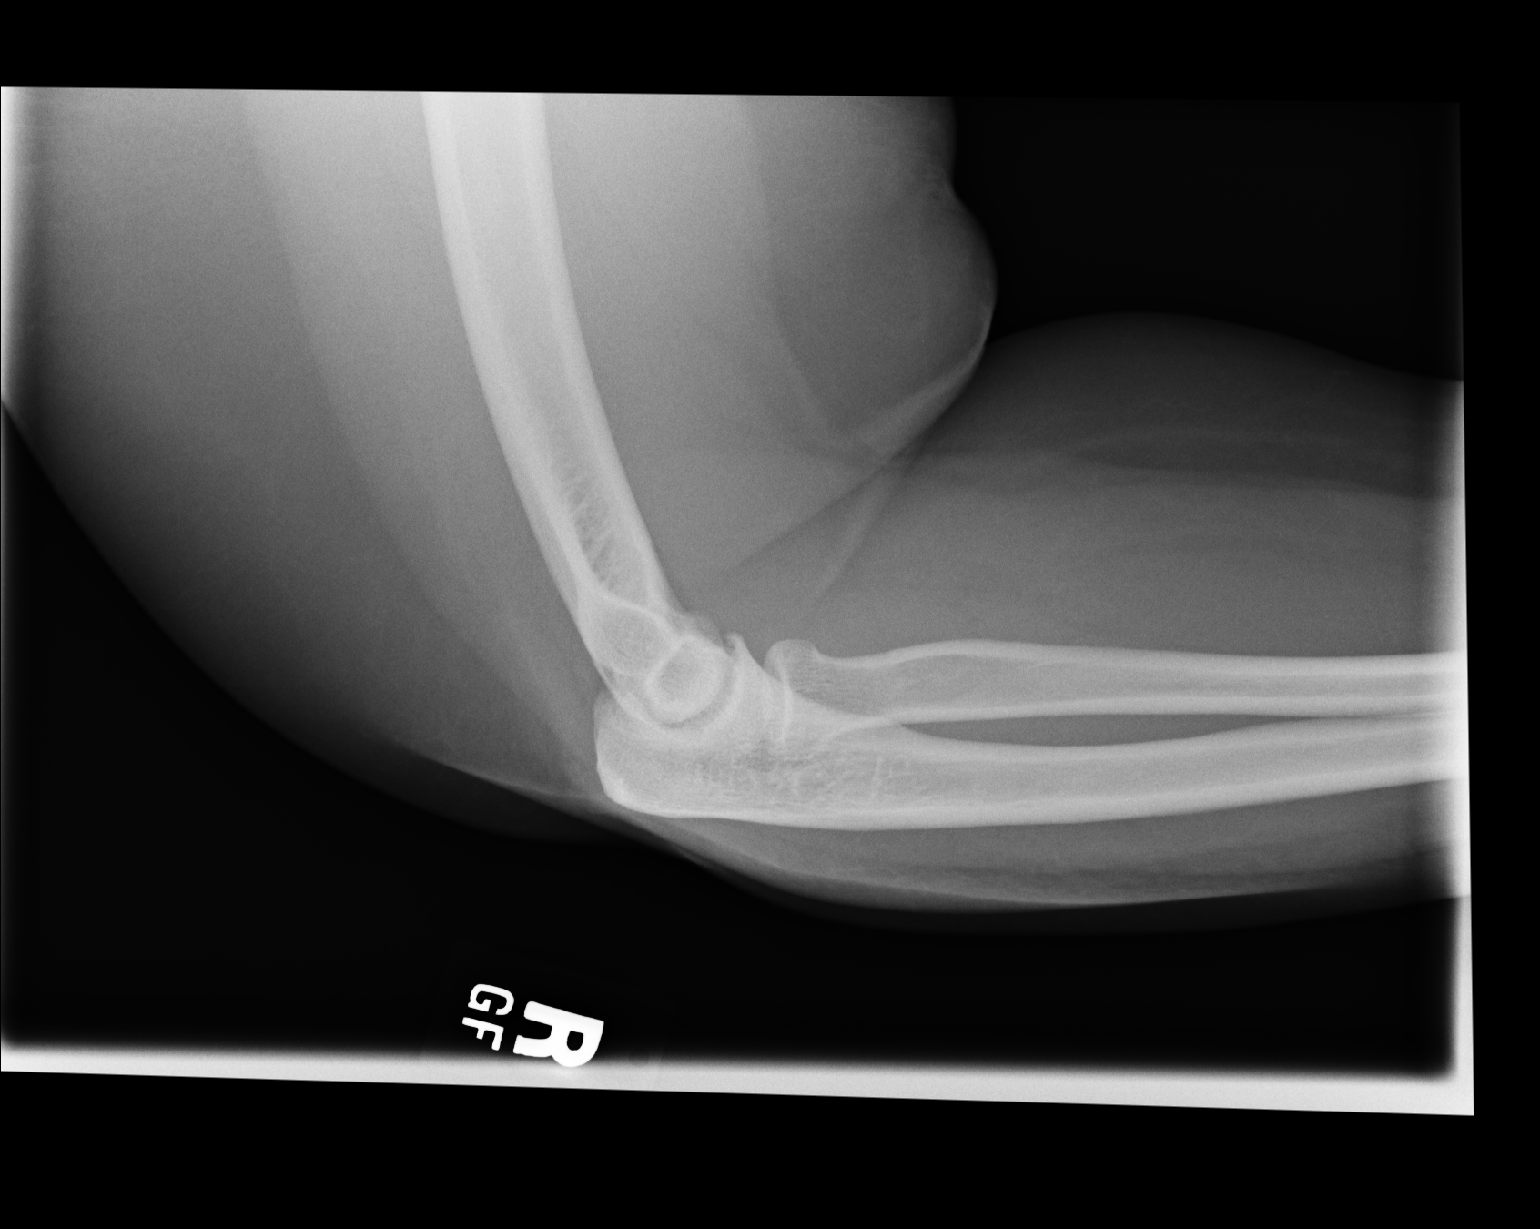
[im 2/2]
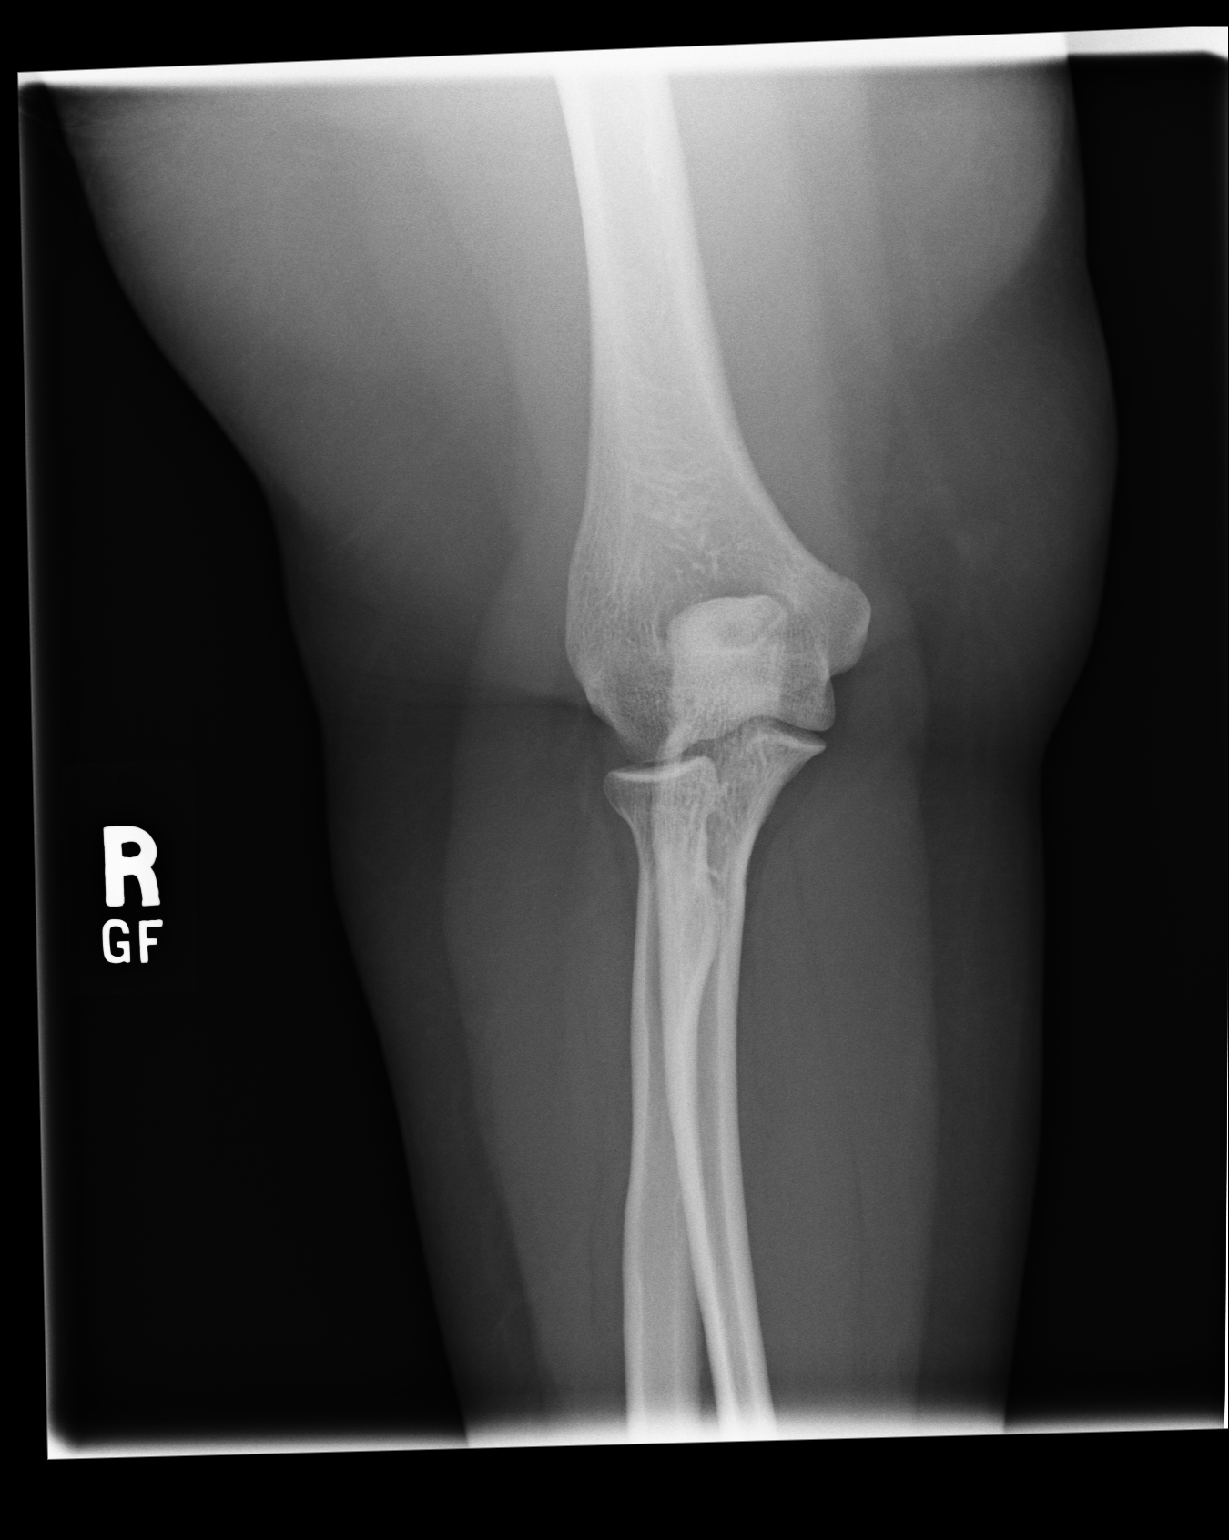

[2 of 2 positions shown; findings below may reference images not displayed]

IMPRESSION: 1.     No significant osseous abnormalities are noted.

## 2007-12-29 ENCOUNTER — Emergency Department: Payer: Self-pay

## 2007-12-31 ENCOUNTER — Emergency Department: Payer: Self-pay | Admitting: Emergency Medicine

## 2009-01-03 ENCOUNTER — Emergency Department: Payer: Self-pay | Admitting: Emergency Medicine

## 2009-02-06 ENCOUNTER — Emergency Department: Payer: Self-pay | Admitting: Emergency Medicine

## 2009-02-26 ENCOUNTER — Emergency Department: Payer: Self-pay | Admitting: Emergency Medicine

## 2009-07-02 ENCOUNTER — Emergency Department: Payer: Self-pay | Admitting: Emergency Medicine

## 2009-07-18 ENCOUNTER — Emergency Department: Payer: Self-pay | Admitting: Emergency Medicine

## 2009-08-04 ENCOUNTER — Emergency Department: Payer: Self-pay | Admitting: Emergency Medicine

## 2009-09-22 ENCOUNTER — Emergency Department: Payer: Self-pay | Admitting: Emergency Medicine

## 2009-10-25 ENCOUNTER — Emergency Department: Payer: Self-pay | Admitting: Emergency Medicine

## 2009-10-31 ENCOUNTER — Emergency Department: Payer: Self-pay | Admitting: Emergency Medicine

## 2009-11-17 ENCOUNTER — Emergency Department: Payer: Self-pay | Admitting: Emergency Medicine

## 2010-01-08 ENCOUNTER — Emergency Department: Payer: Self-pay | Admitting: Emergency Medicine

## 2010-03-03 ENCOUNTER — Ambulatory Visit: Payer: Self-pay | Admitting: Orthopedic Surgery

## 2010-03-05 ENCOUNTER — Ambulatory Visit: Payer: Self-pay | Admitting: Orthopedic Surgery

## 2010-04-14 IMAGING — CR DG KNEE COMPLETE 4+V*L*
1 series · 4 of 4 positions shown · non-contrast
Comparison: none

REASON FOR EXAM: pain/edema 2nd to fall
COMMENTS:

PROCEDURE:     DXR - DXR KNEE LT COMP WITH OBLIQUES  - July 02, 2009  [DATE]
RESULT:     No acute soft tissue or bony abnormality identified.

[Series 1: view not recorded · 0.17mm/px · 4 of 4 slices shown]
[im 1/4]
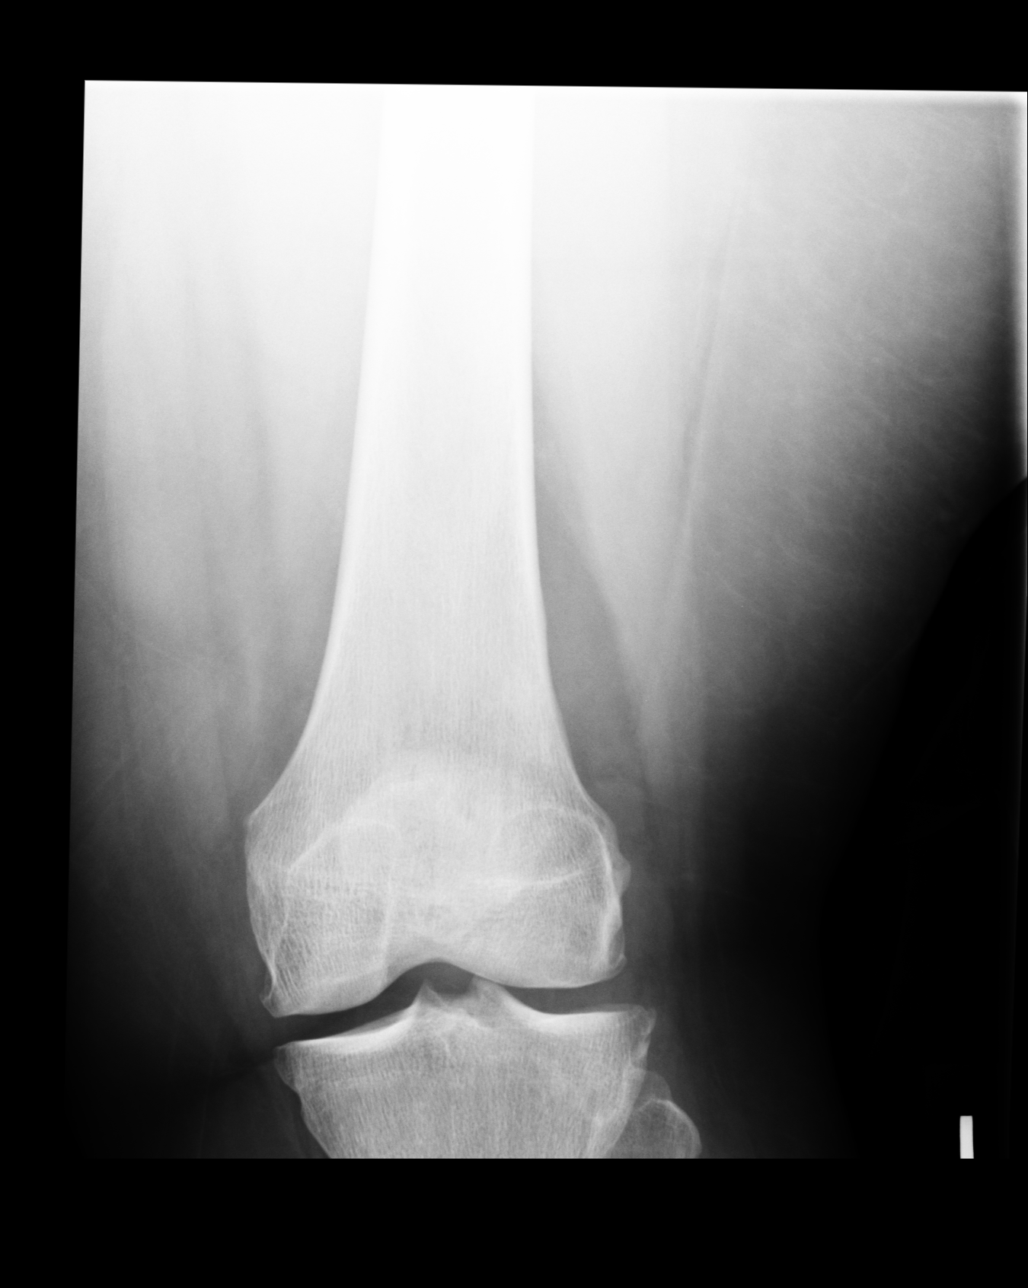
[im 2/4]
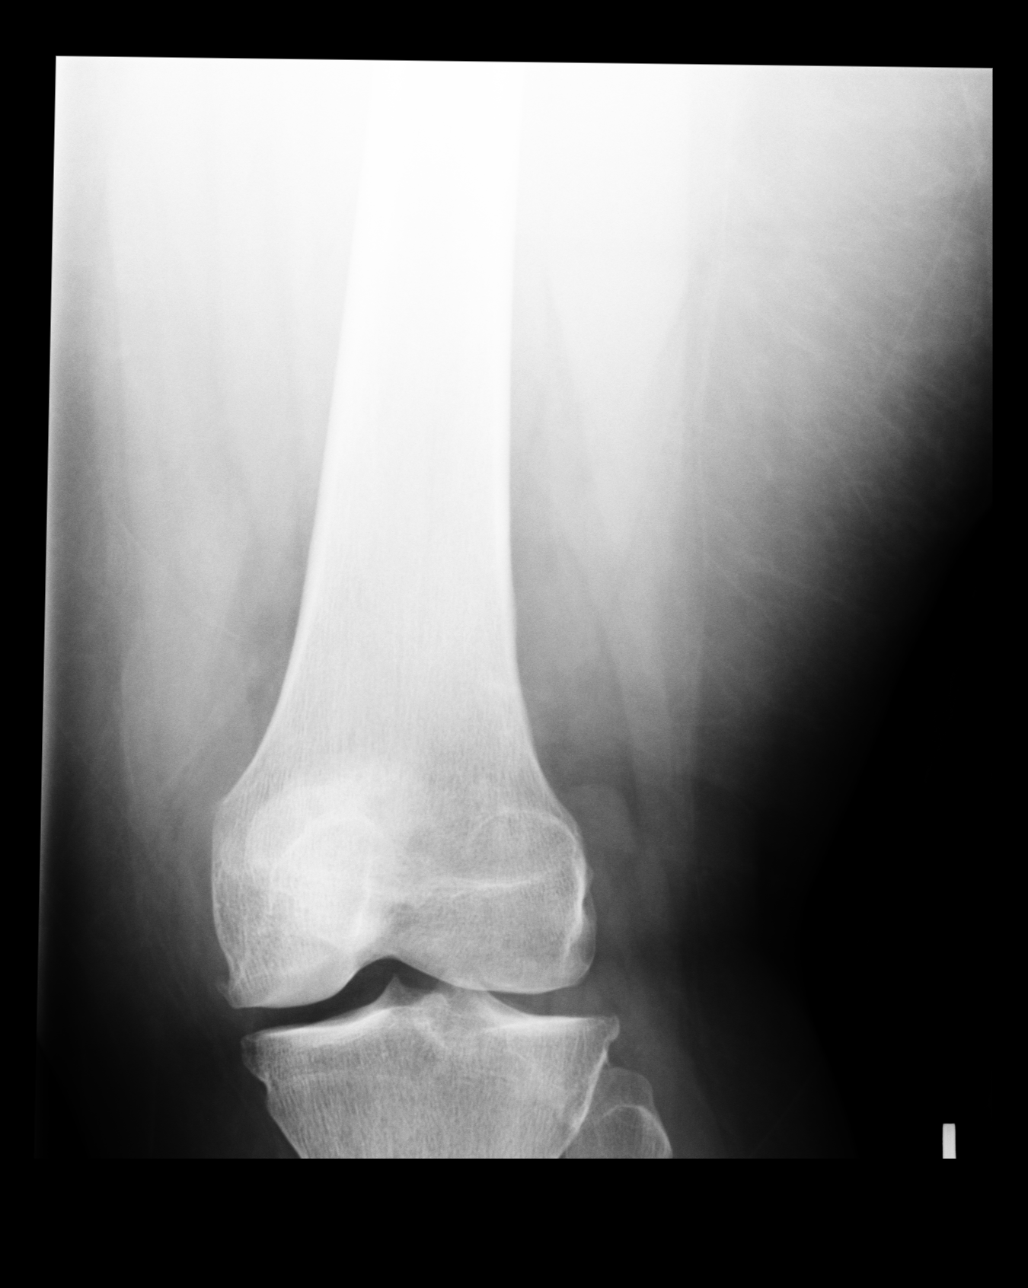
[im 3/4]
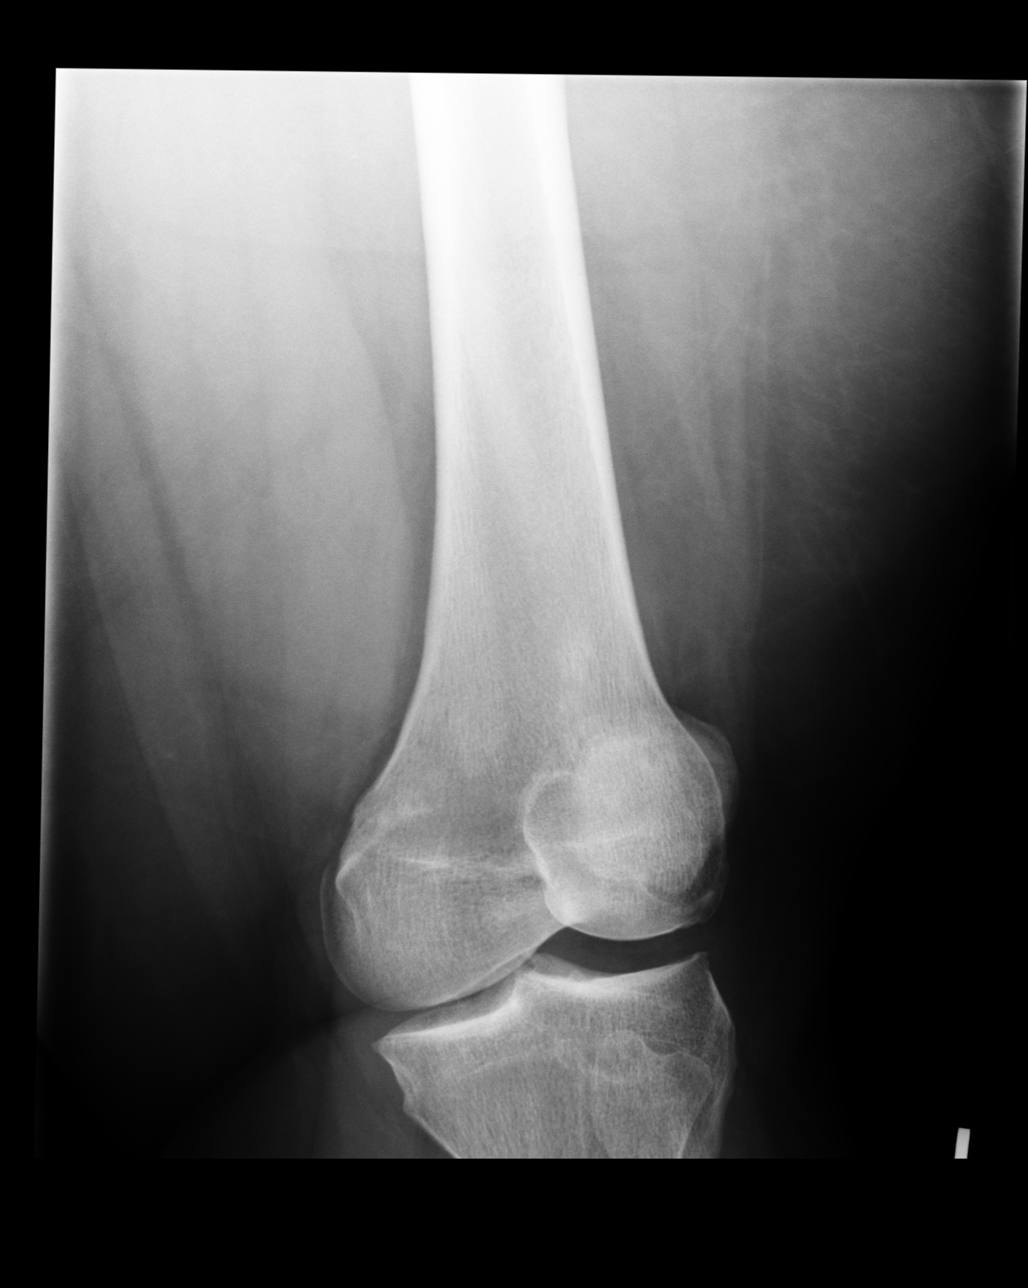
[im 4/4]
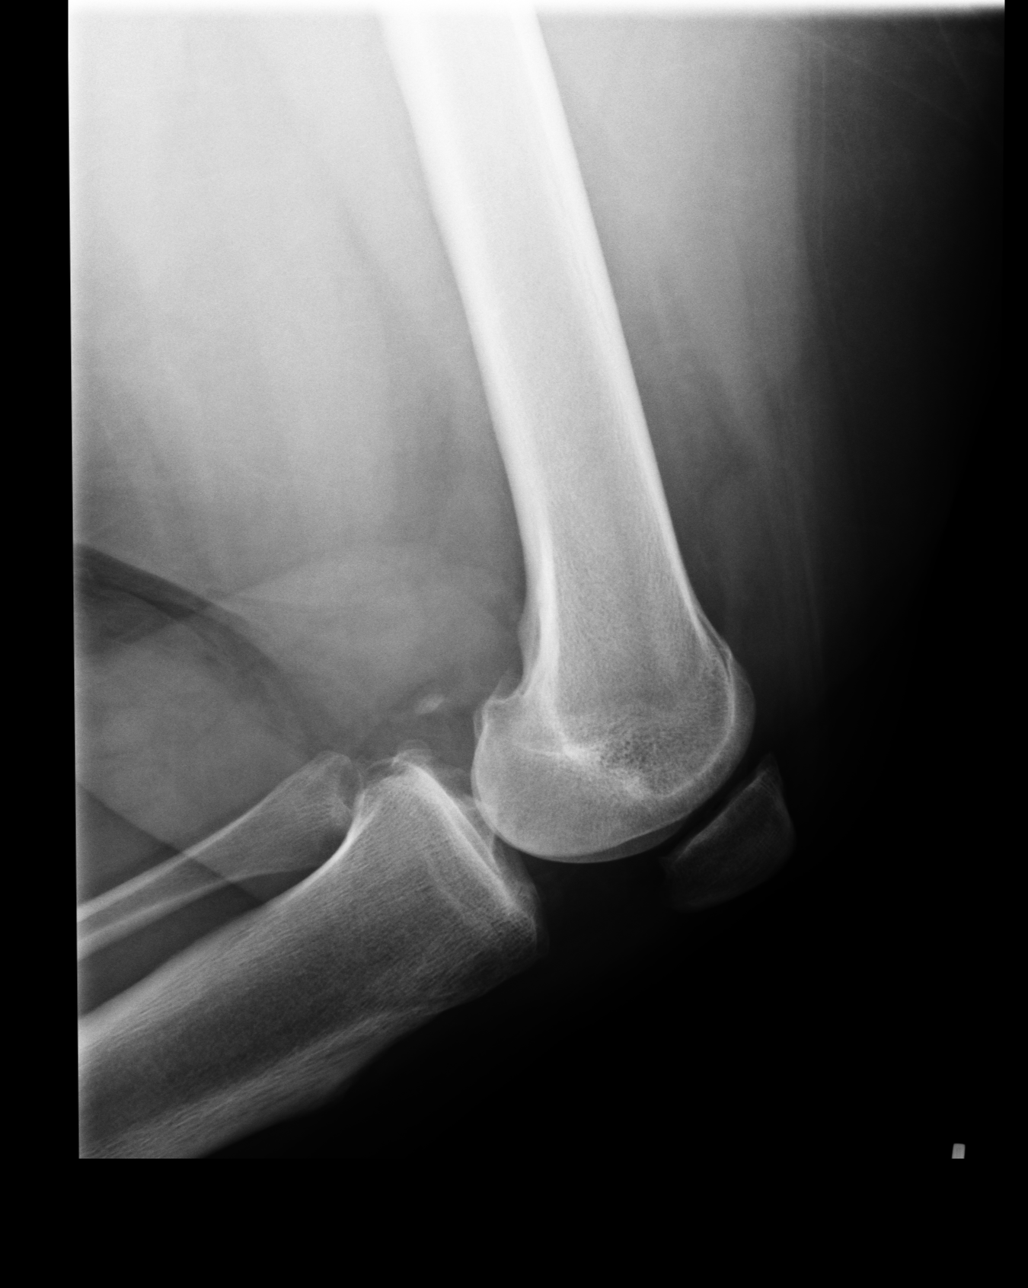

[4 of 4 positions shown; findings below may reference images not displayed]

IMPRESSION: No acute abnormality.

## 2010-04-27 ENCOUNTER — Encounter: Payer: Self-pay | Admitting: Orthopedic Surgery

## 2010-04-29 ENCOUNTER — Emergency Department: Payer: Self-pay | Admitting: Unknown Physician Specialty

## 2010-08-30 IMAGING — CR DG KNEE COMPLETE 4+V*R*
1 series · 5 of 5 positions shown · non-contrast
Comparison: none

REASON FOR EXAM: pain in right knee after falling, history of chronic
right knee pain
COMMENTS:

[Series 1: view not recorded · 0.17mm/px · 5 of 5 slices shown]
[im 1/5]
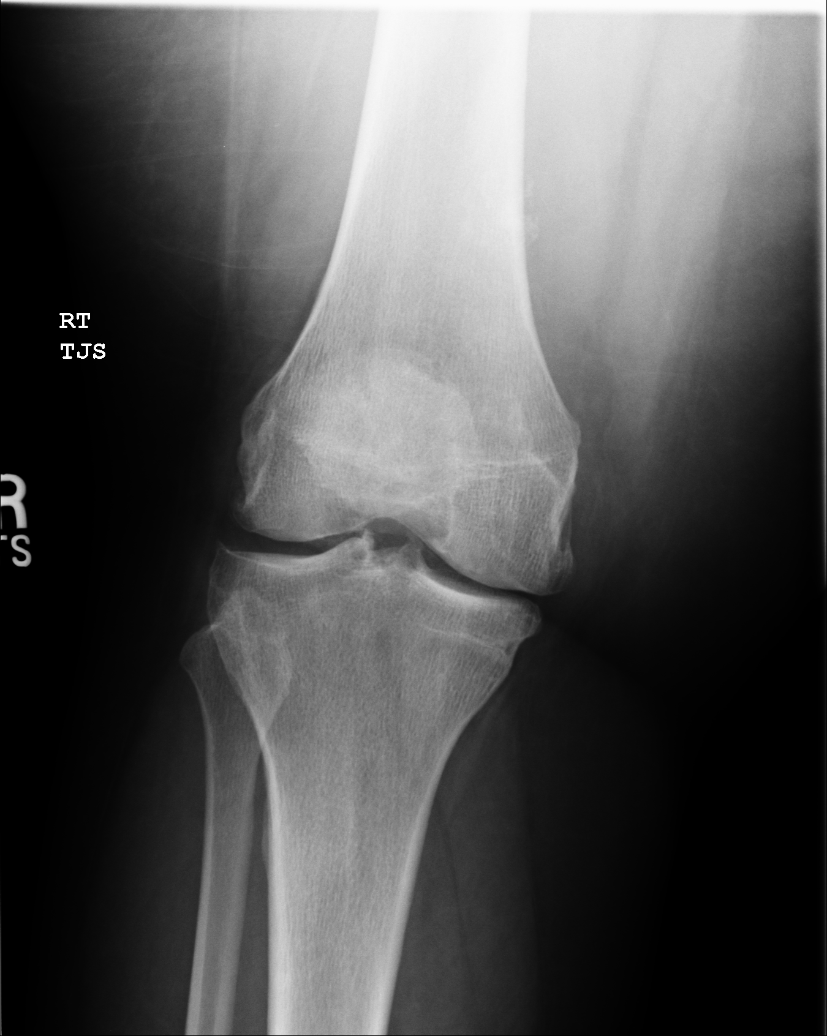
[im 2/5]
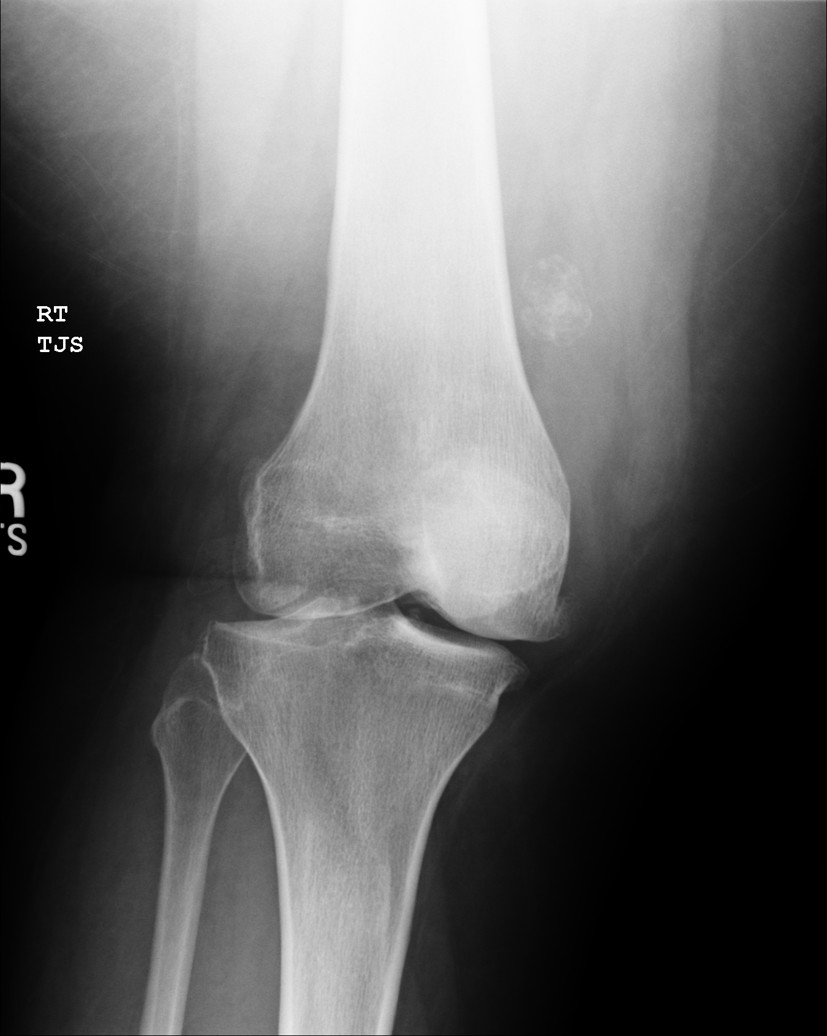
[im 3/5]
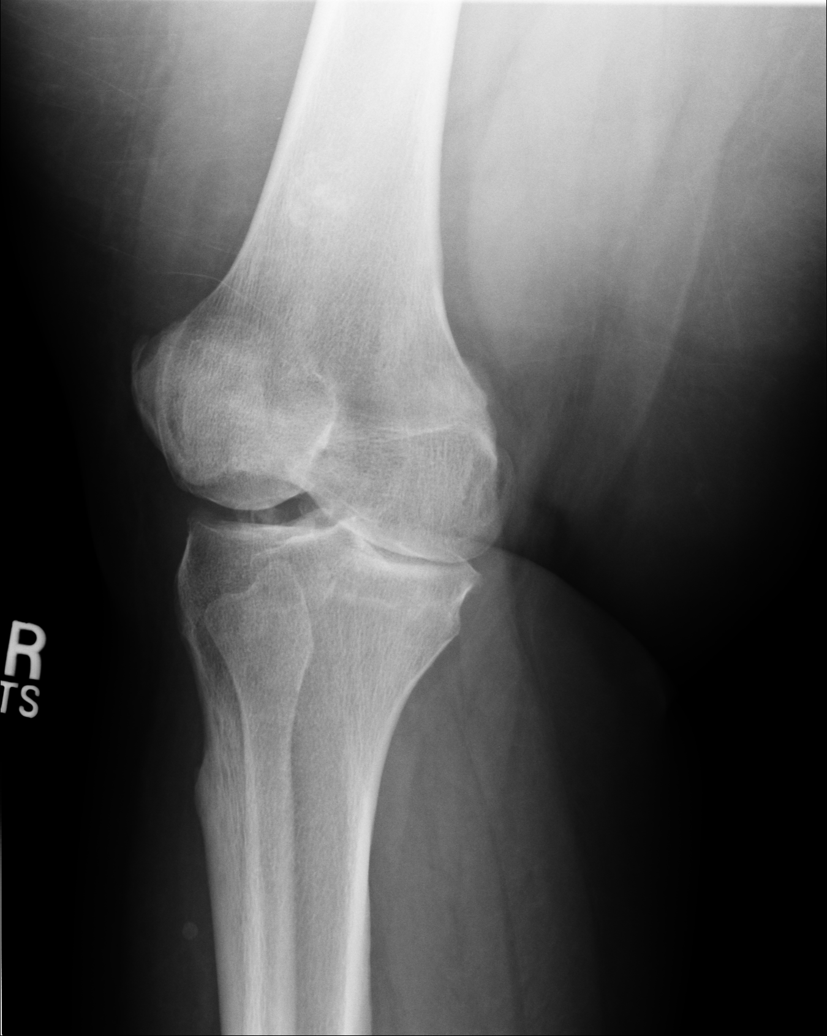
[im 4/5]
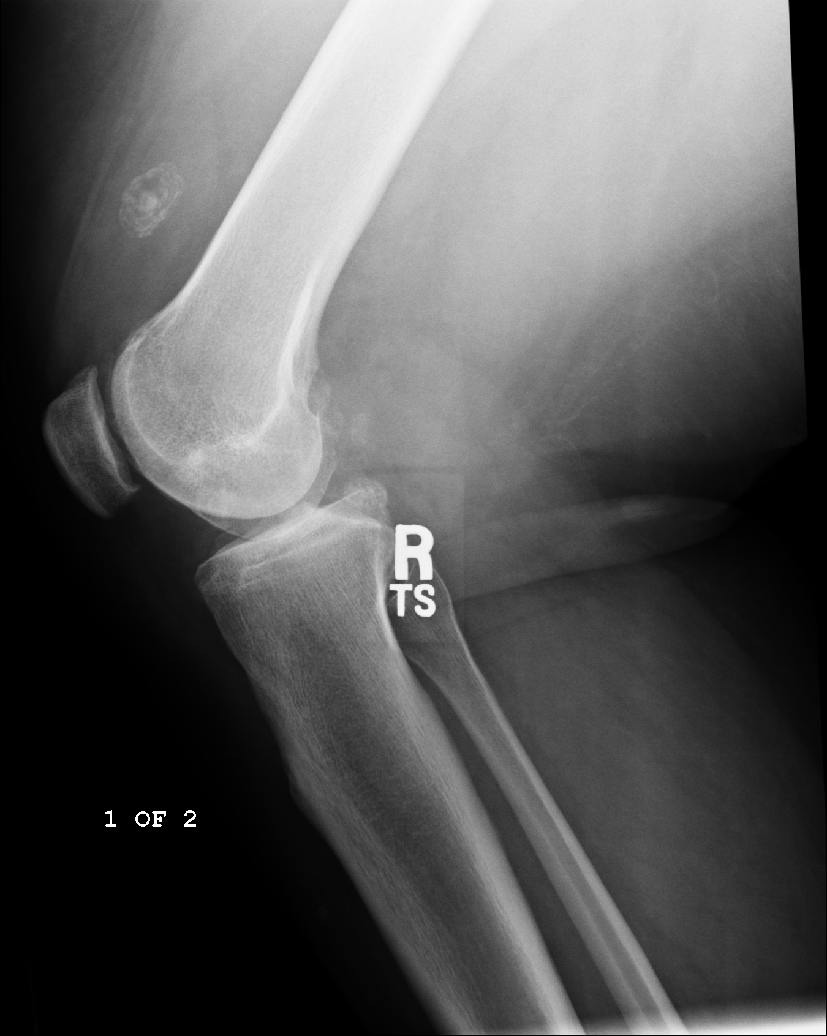
[im 5/5]
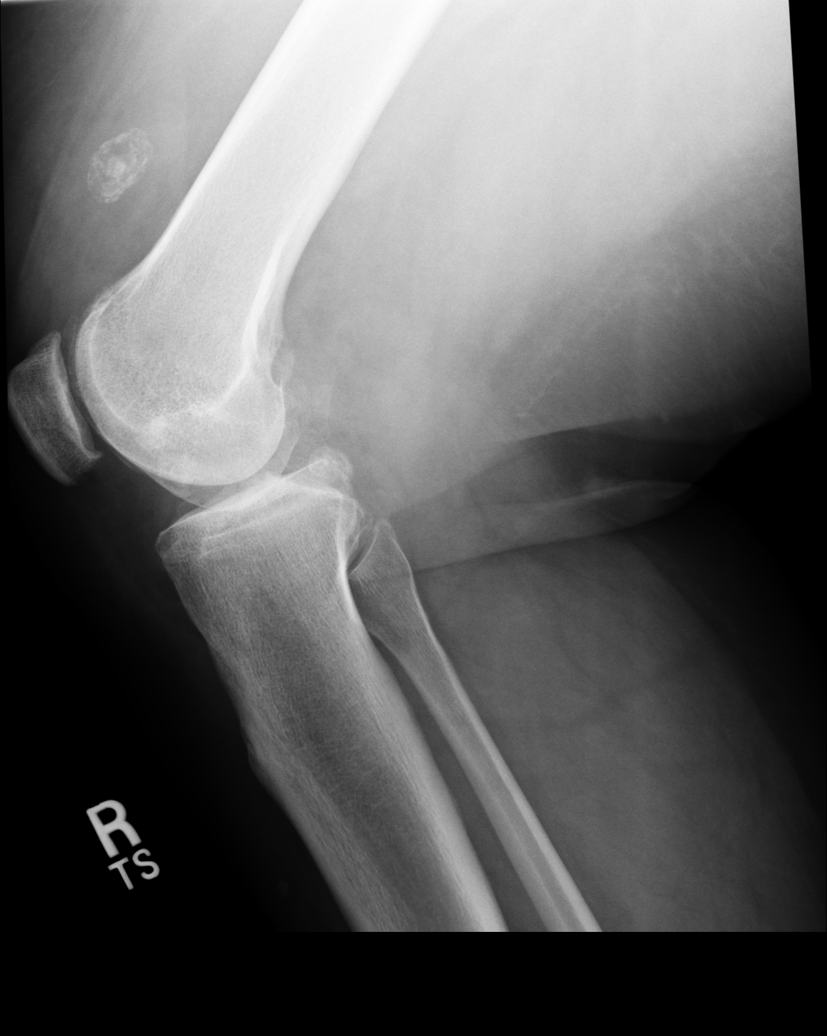

[5 of 5 positions shown; findings below may reference images not displayed]

PROCEDURE:     DXR - DXR KNEE RT COMP WITH OBLIQUES  - November 17, 2009  [DATE]

RESULT:     Five views of the right knee are submitted. There is moderate
degenerative change involving all 3 joint compartments. There is a coarse
laminated calcification projecting in the region of the suprapatellar bursa.
I do not see evidence of acute fracture or dislocation.
IMPRESSION: There are moderate degenerative changes of the right knee.
There is likely calcific bursitis as well. Followup elective MRI may be of
value.

## 2010-11-22 HISTORY — PX: KNEE ARTHROSCOPY: SUR90

## 2011-01-18 ENCOUNTER — Ambulatory Visit: Payer: Self-pay | Admitting: Emergency Medicine

## 2011-01-19 ENCOUNTER — Ambulatory Visit: Payer: Self-pay | Admitting: Emergency Medicine

## 2011-01-21 LAB — PATHOLOGY REPORT

## 2011-01-27 ENCOUNTER — Ambulatory Visit: Payer: Self-pay | Admitting: Family Medicine

## 2011-02-09 IMAGING — CR RIGHT HAND - COMPLETE 3+ VIEW
1 series · 3 of 3 positions shown · non-contrast
Comparison: none

REASON FOR EXAM: pain/swelling......
COMMENTS:   LMP: depo

PROCEDURE:     DXR - DXR HAND RT COMPLETE W/OBLIQUES  - April 29, 2010  [DATE]
RESULT:     No fracture, dislocation or other acute bony abnormality is
identified.

[Series 1: view not recorded · 0.17mm/px · 3 of 3 slices shown]
[im 1/3]
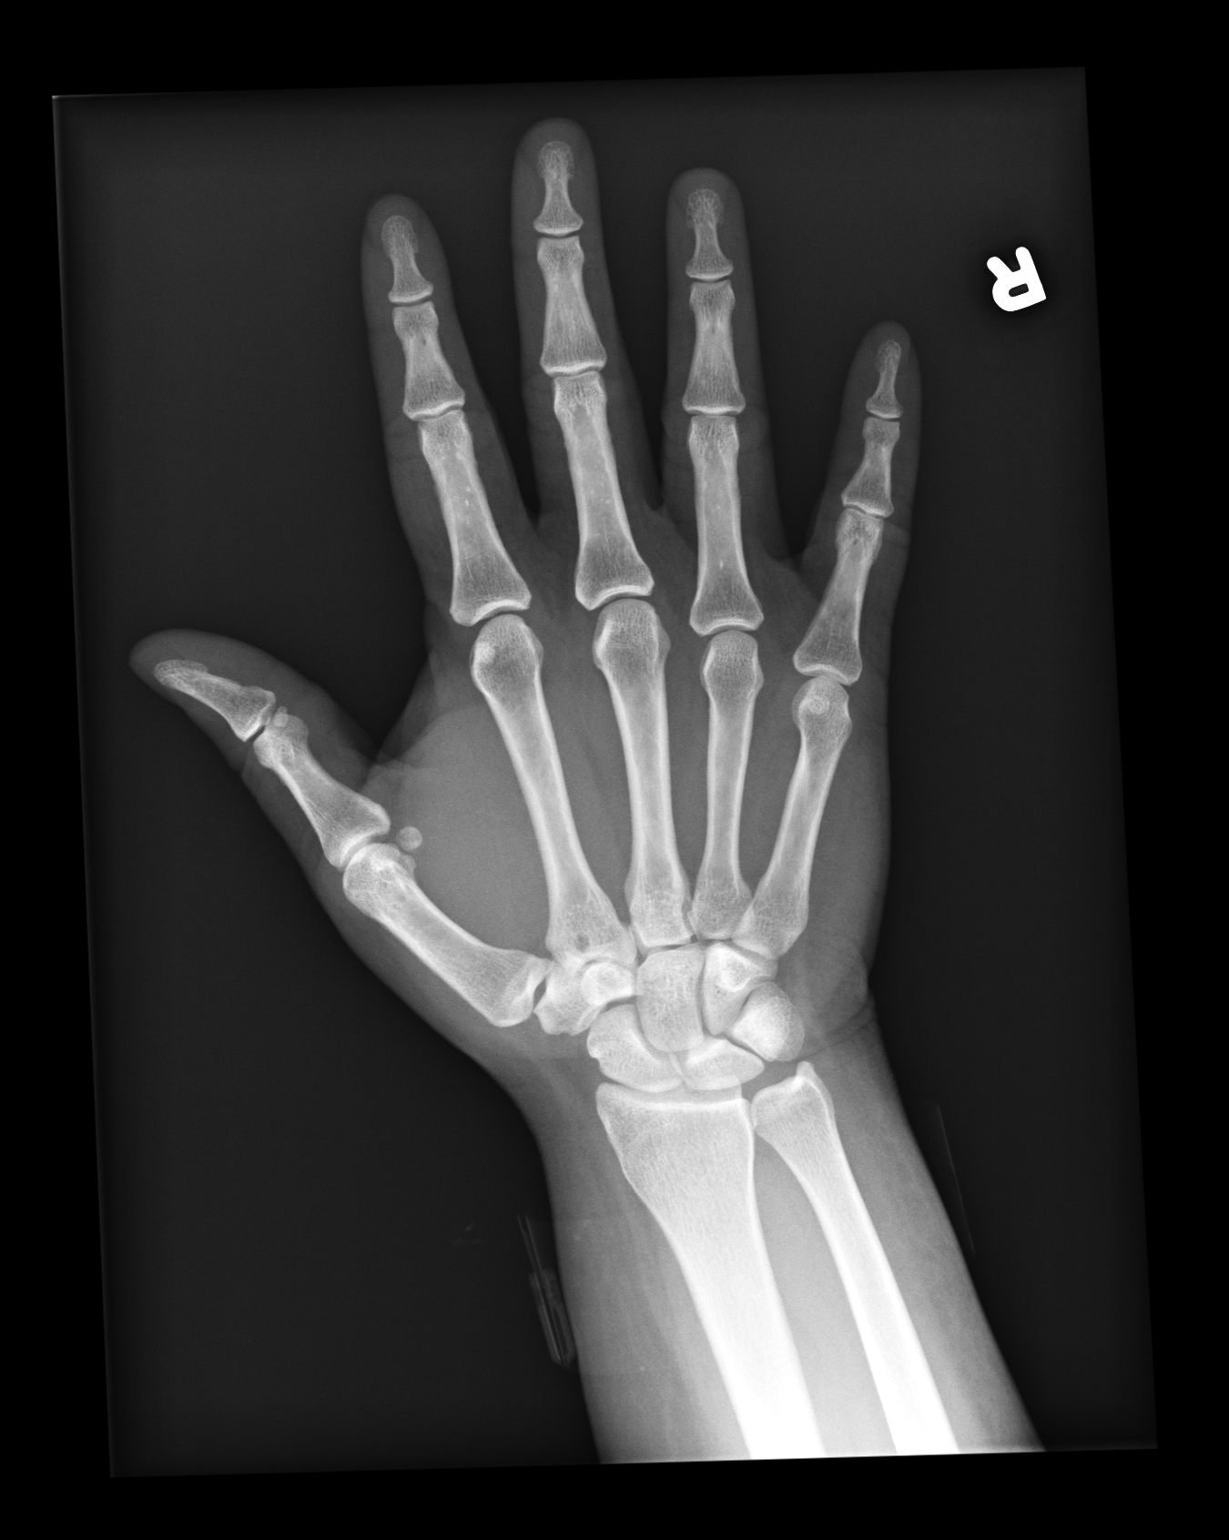
[im 2/3]
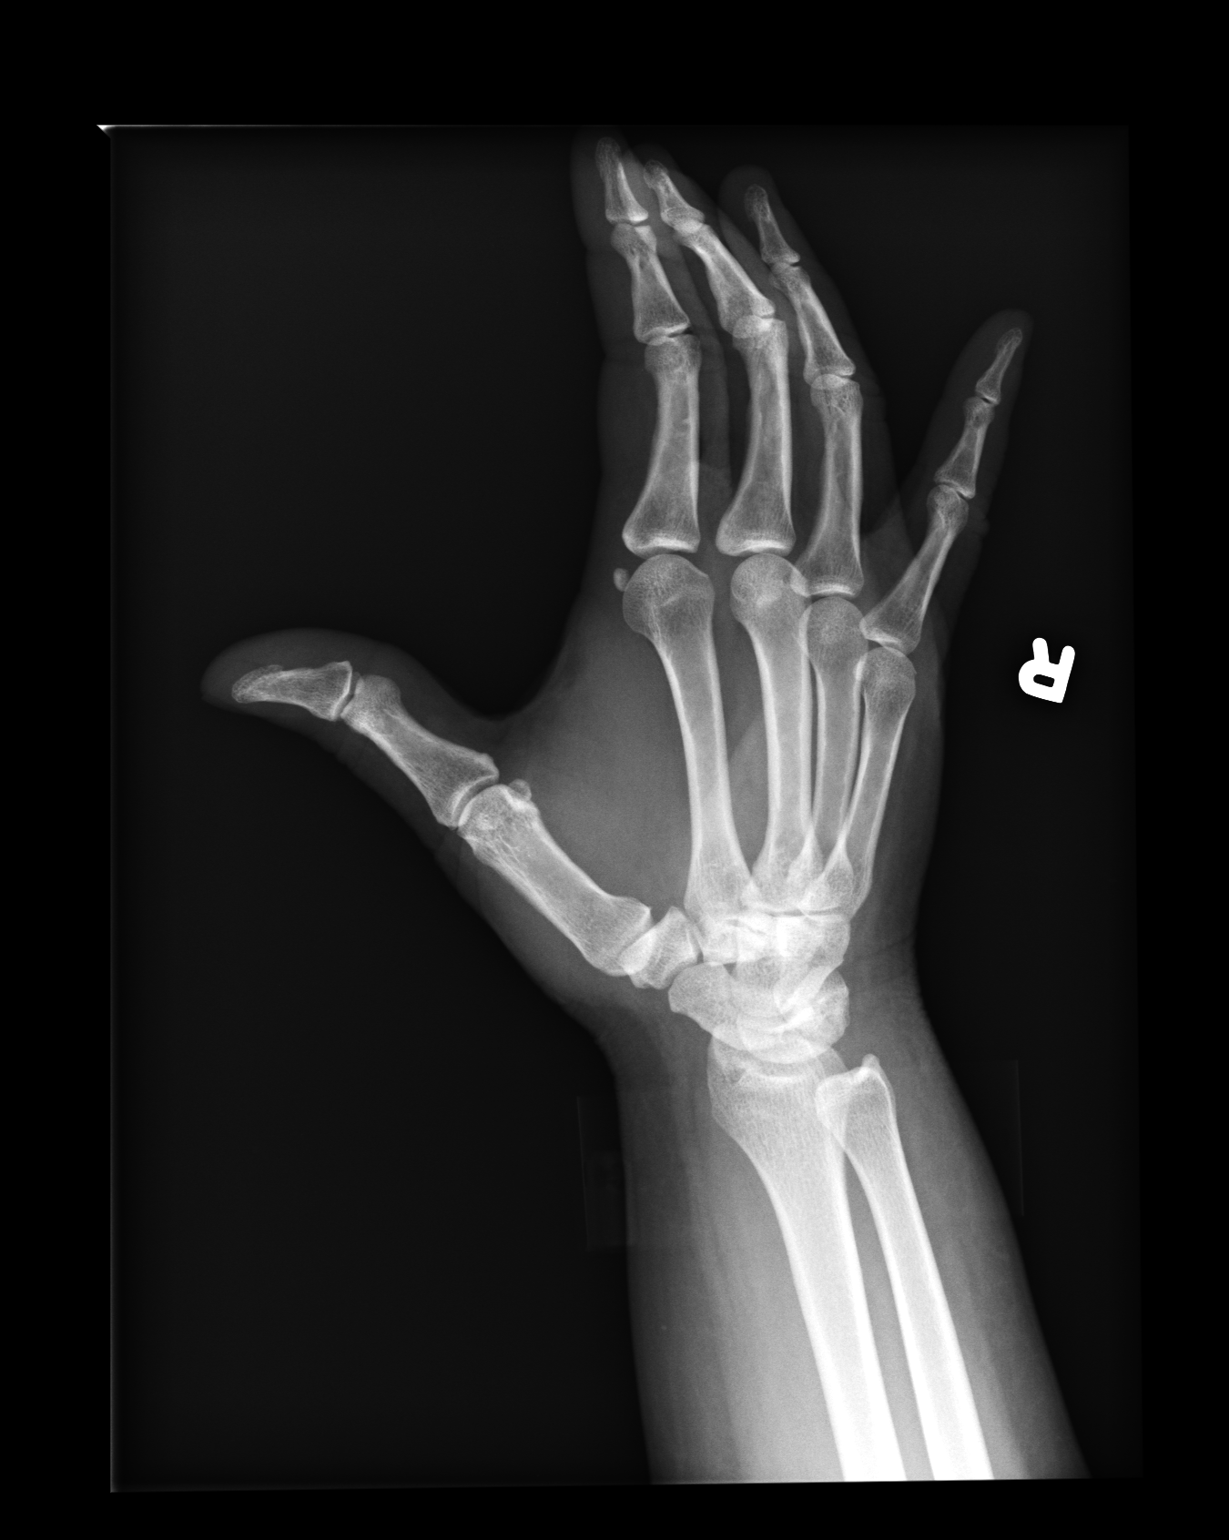
[im 3/3]
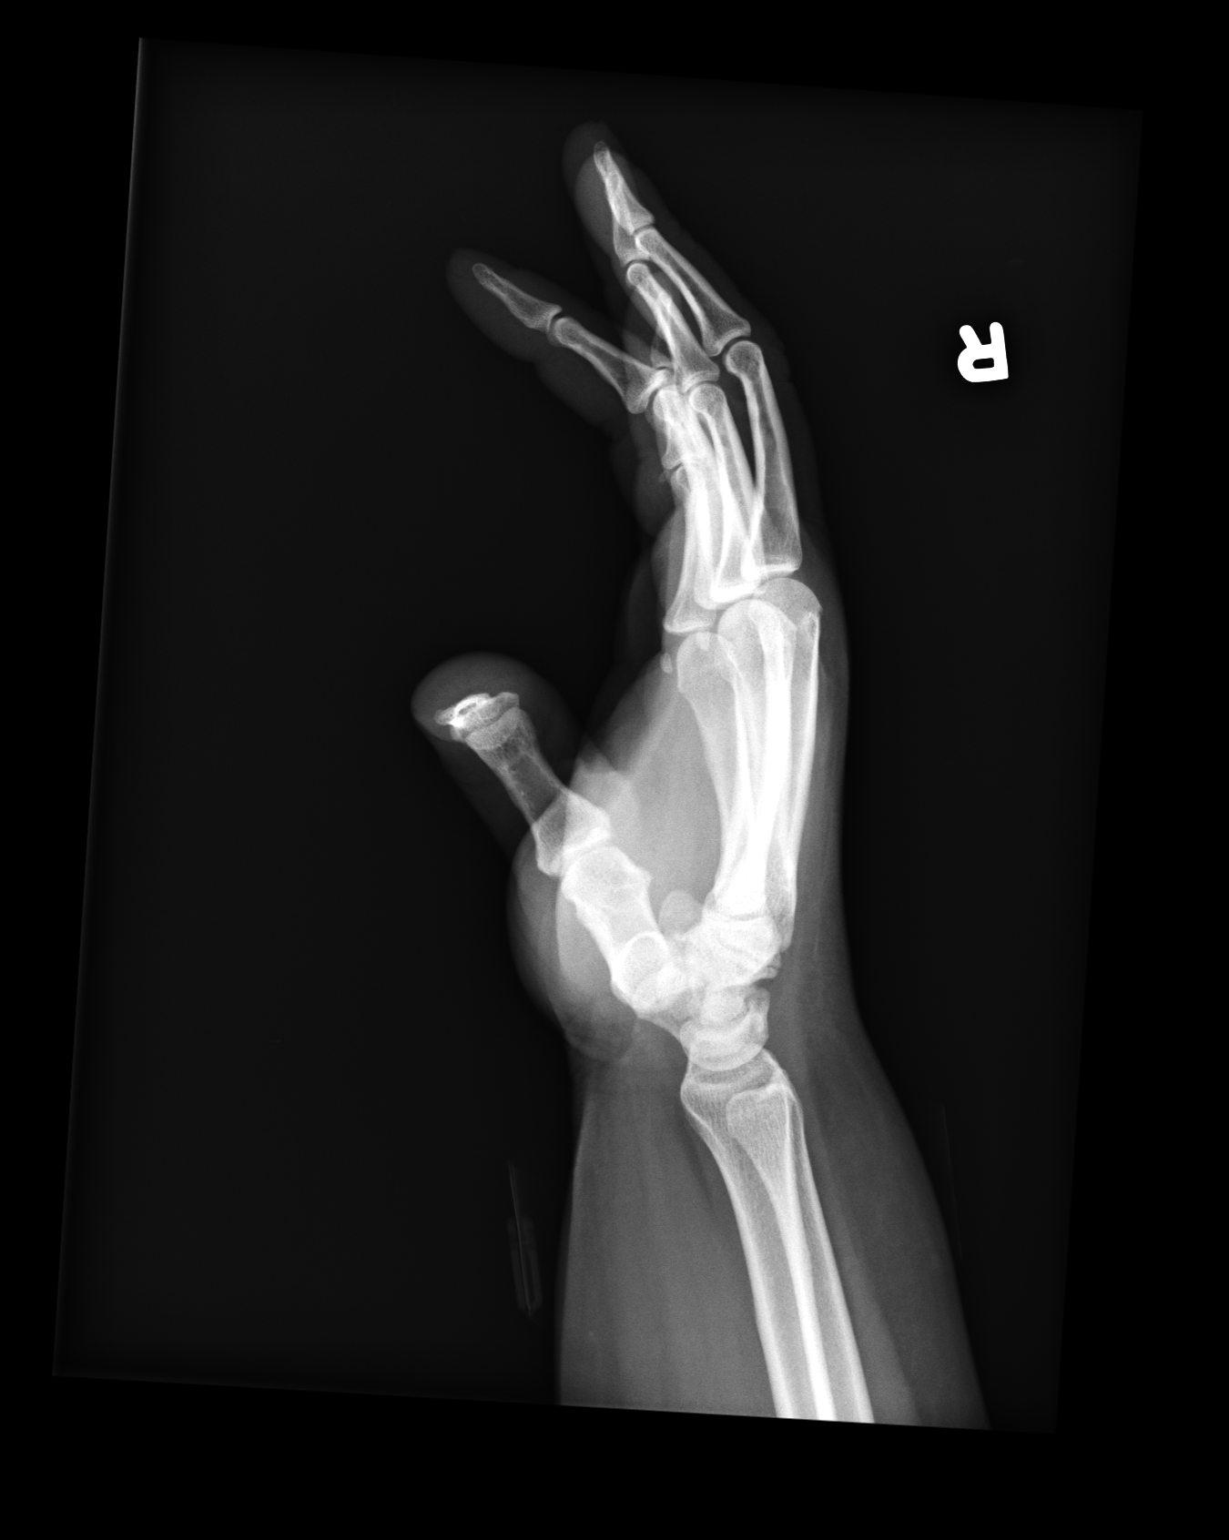

[3 of 3 positions shown; findings below may reference images not displayed]

IMPRESSION: 1.     No significant abnormalities are noted.

## 2011-04-27 ENCOUNTER — Ambulatory Visit: Payer: Self-pay | Admitting: Orthopedic Surgery

## 2011-10-31 IMAGING — CT CT ABD-PELV W/ CM
1 of 3 series · 14 of 32 positions shown, 19 images · non-contrast
Comparison: none

REASON FOR EXAM: ON METFORMIN ORAL AND IV lower abd pain diarrhea
COMMENTS:

[Series 2: stone · axial · 0.84mm/px · z∈[-938,-509]mm · 14 of 159 slices shown, 19 images]
[im 8/159  soft-tissue]
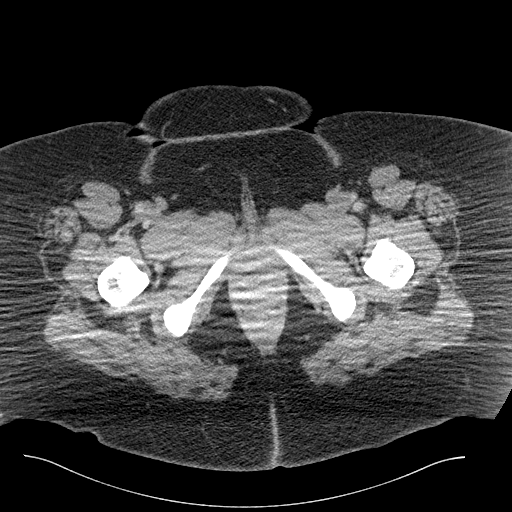
[im 8/159  bone]
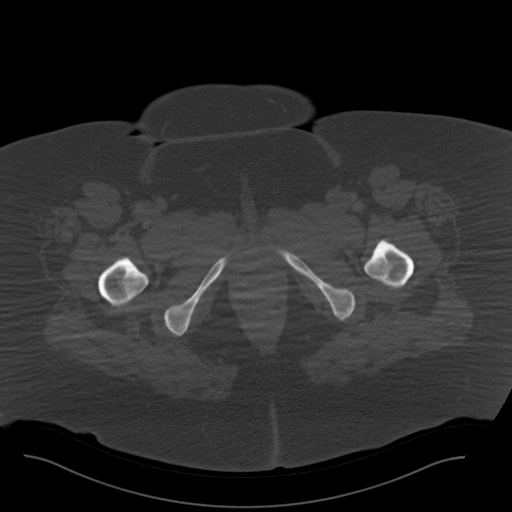
[im 24/159  soft-tissue]
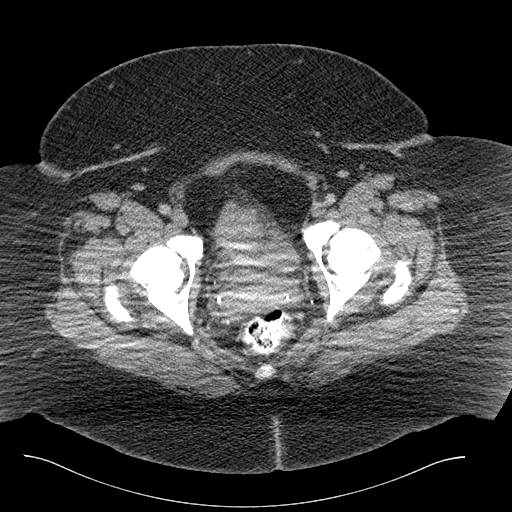
[im 32/159  soft-tissue]
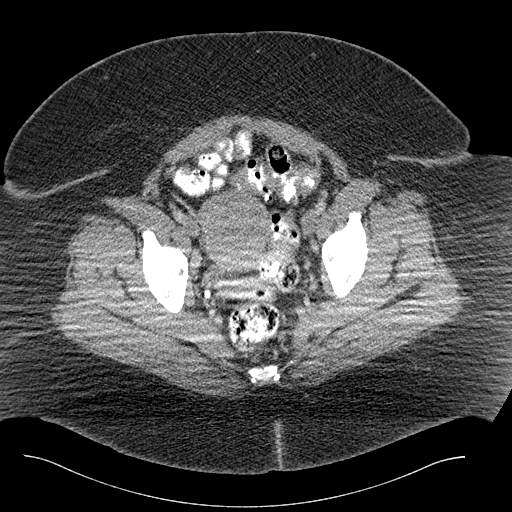
[im 48/159  soft-tissue]
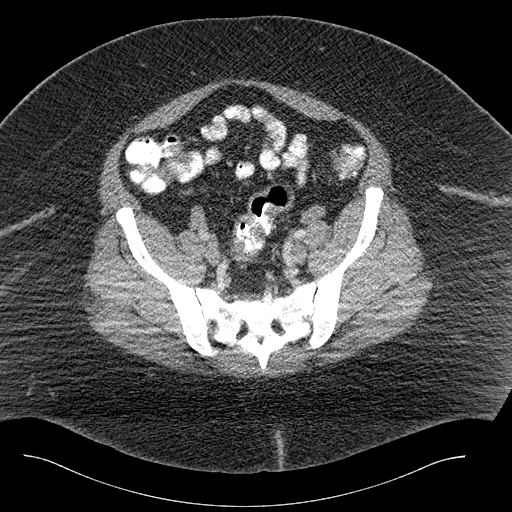
[im 56/159  soft-tissue]
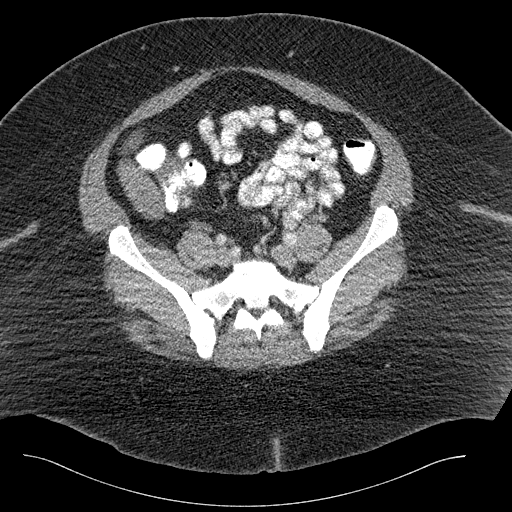
[im 72/159  soft-tissue]
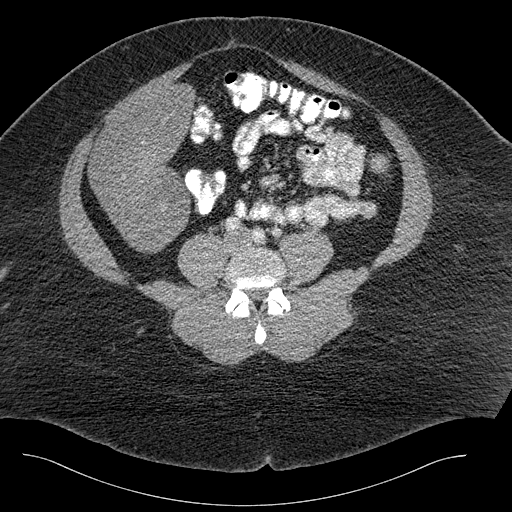
[im 80/159  soft-tissue]
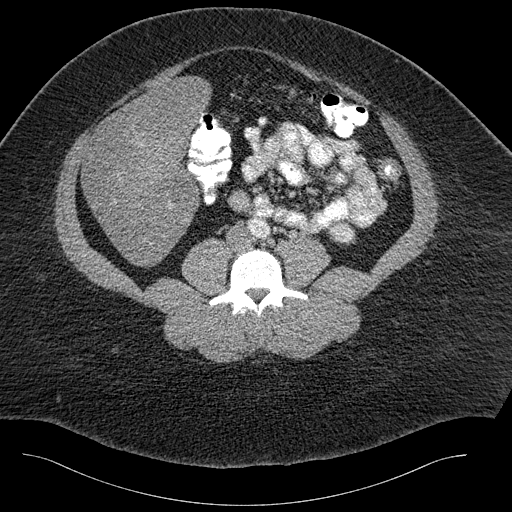
[im 87/159  soft-tissue]
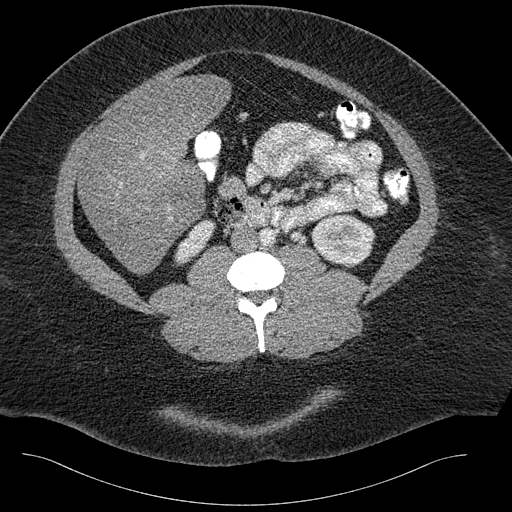
[im 103/159  soft-tissue]
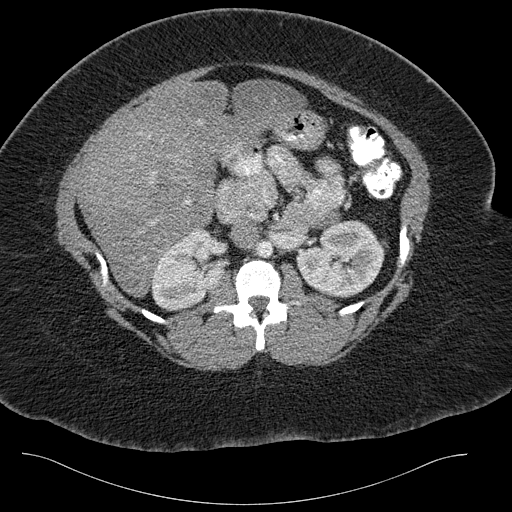
[im 103/159  bone]
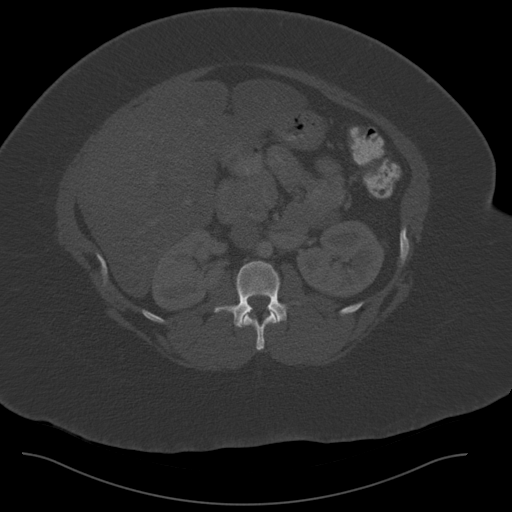
[im 111/159  soft-tissue]
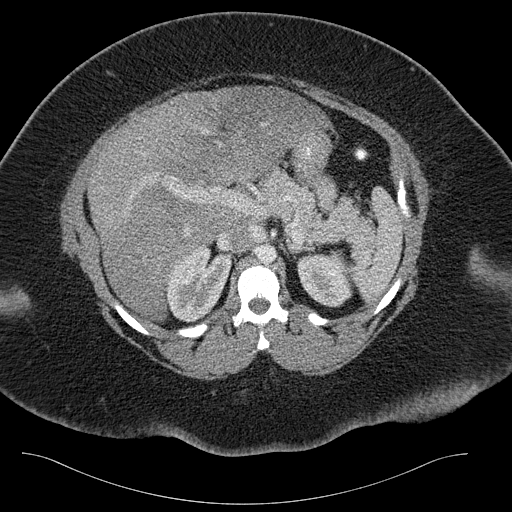
[im 127/159  soft-tissue]
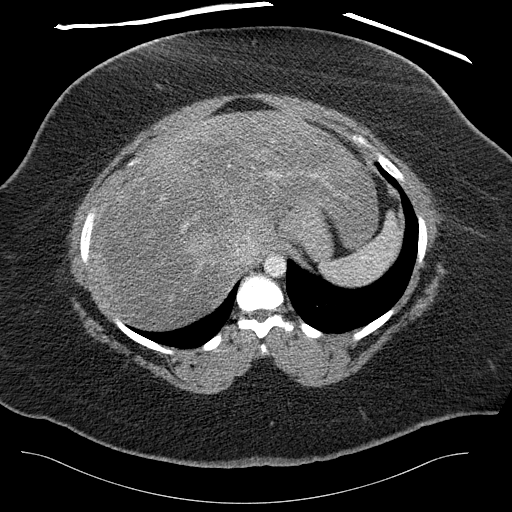
[im 127/159  lung]
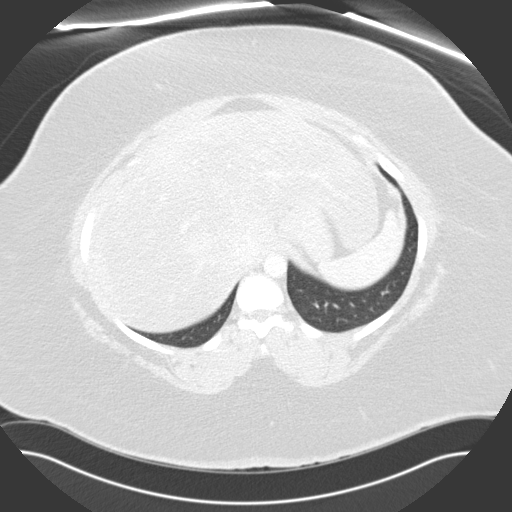
[im 135/159  soft-tissue]
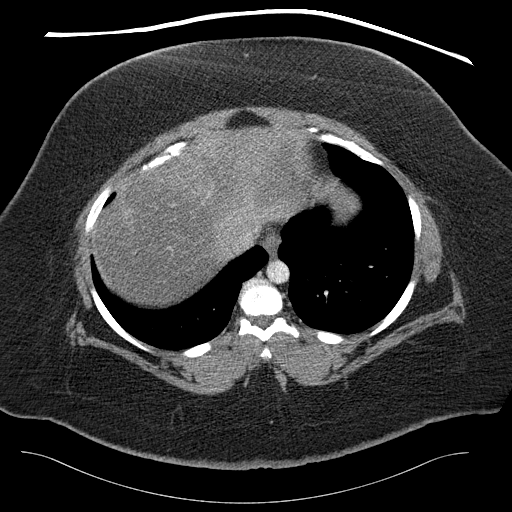
[im 135/159  lung]
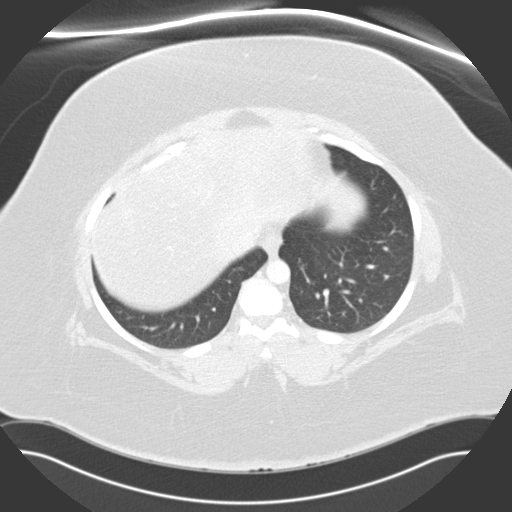
[im 143/159  lung]
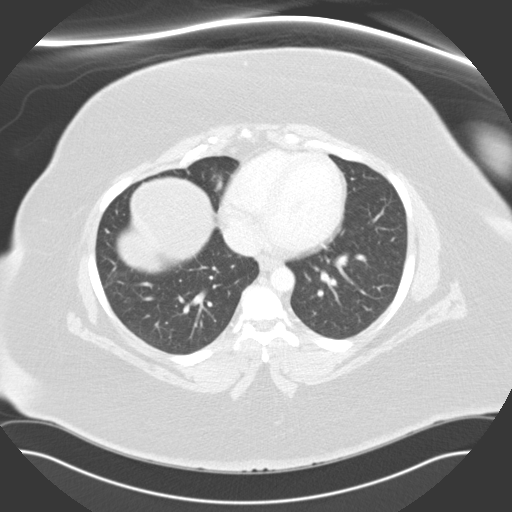
[im 151/159  soft-tissue]
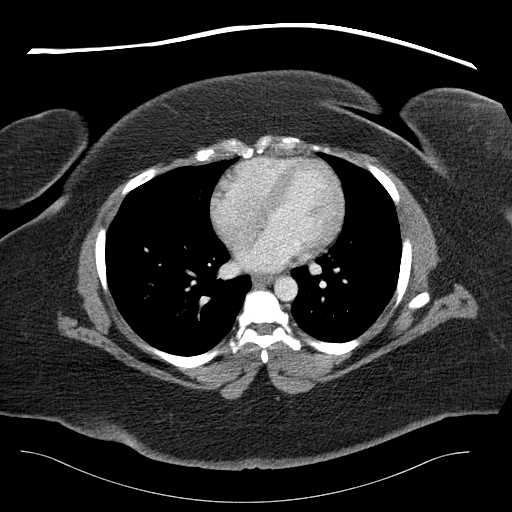
[im 151/159  lung]
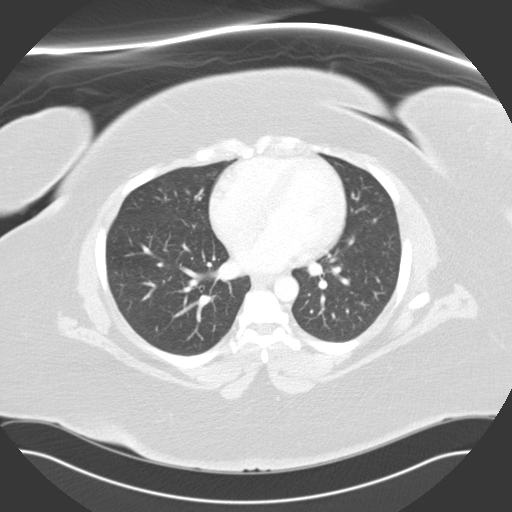

[14 of 32 positions shown; findings below may reference images not displayed]

PROCEDURE:     CT  - CT ABDOMEN / PELVIS  W  - January 18, 2011  [DATE]

RESULT:     Axial imaging was performed through the abdomen and pelvis at 3
mm intervals and slice thicknesses. The patient received 100 cc of
[id number] and also received oral contrast material. Review of multiplanar
reconstructed images was performed separately on the VIA monitor.

The images are limited due to artifact generated by the patient's contact
with the scanner gantry. The liver exhibits decreased density consistent
with fatty infiltration. There is no focal mass nor ductal dilation. The
gallbladder appears only partially distended. I see no calcified stones. The
pancreas, spleen, nondistended stomach, adrenal glands, and kidneys exhibit
no acute abnormality. The caliber of the abdominal aorta is normal.

The partially contrast-filled loops of small and large bowel exhibit no
evidence of obstruction nor ileus. There is sigmoid diverticulosis without
objective evidence of acute diverticulitis. The appendix fills with contrast
and appears normal. The uterus, urinary bladder, and pelvic sidewall appear
normal. I see no adnexal masses nor free abdominal or pelvic fluid. I see no
significant inguinal nor umbilical hernia. I see no enlarged inguinal lymph
nodes. The lung bases are clear. The lumbar vertebral bodies are preserved
in height.
IMPRESSION: 1. I do not see evidence of bowel obstruction or ileus. There is sigmoid
diverticulosis without CT evidence of acute diverticulitis. The appendix
appears normal.
2. There are fatty infiltrative changes of the liver. The gallbladder is
only partially distended. No stones are demonstrated and no bile duct
dilation is seen. The pancreas exhibits no acute abnormality.
3. I see no acute abnormality of the kidneys or urinary bladder nor uterus
or adnexal regions.

## 2012-02-07 IMAGING — CR DG KNEE COMPLETE 4+V*R*
1 series · 4 of 4 positions shown · non-contrast
Comparison: none

REASON FOR EXAM: knee pain.do ap and lat standing. extreme flexion
standing and sunrise views
COMMENTS:

[Series 1: view not recorded · 0.17mm/px · 4 of 4 slices shown]
[im 1/4]
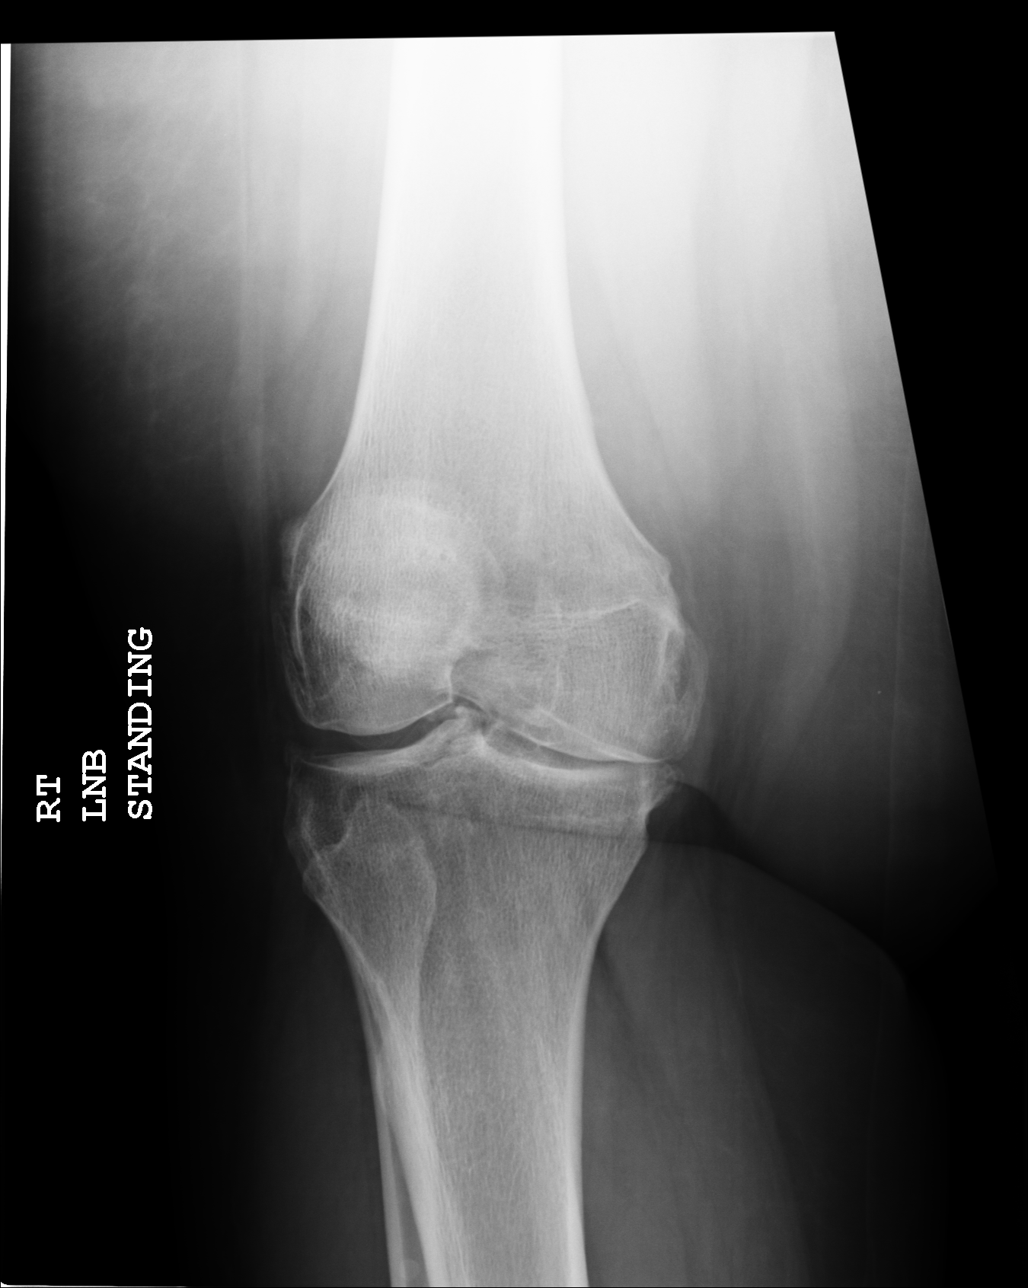
[im 2/4]
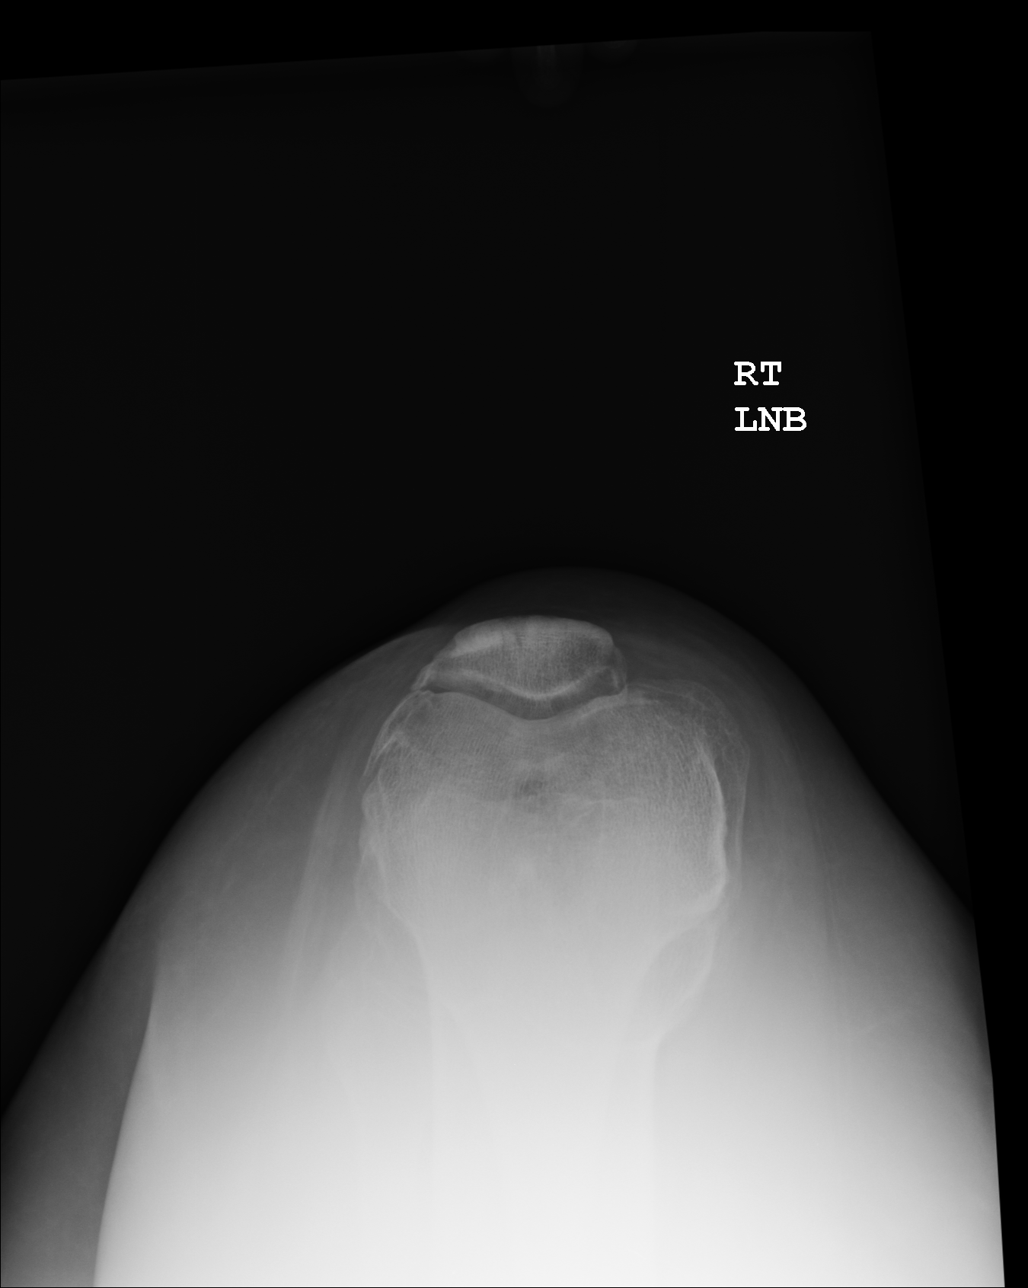
[im 3/4]
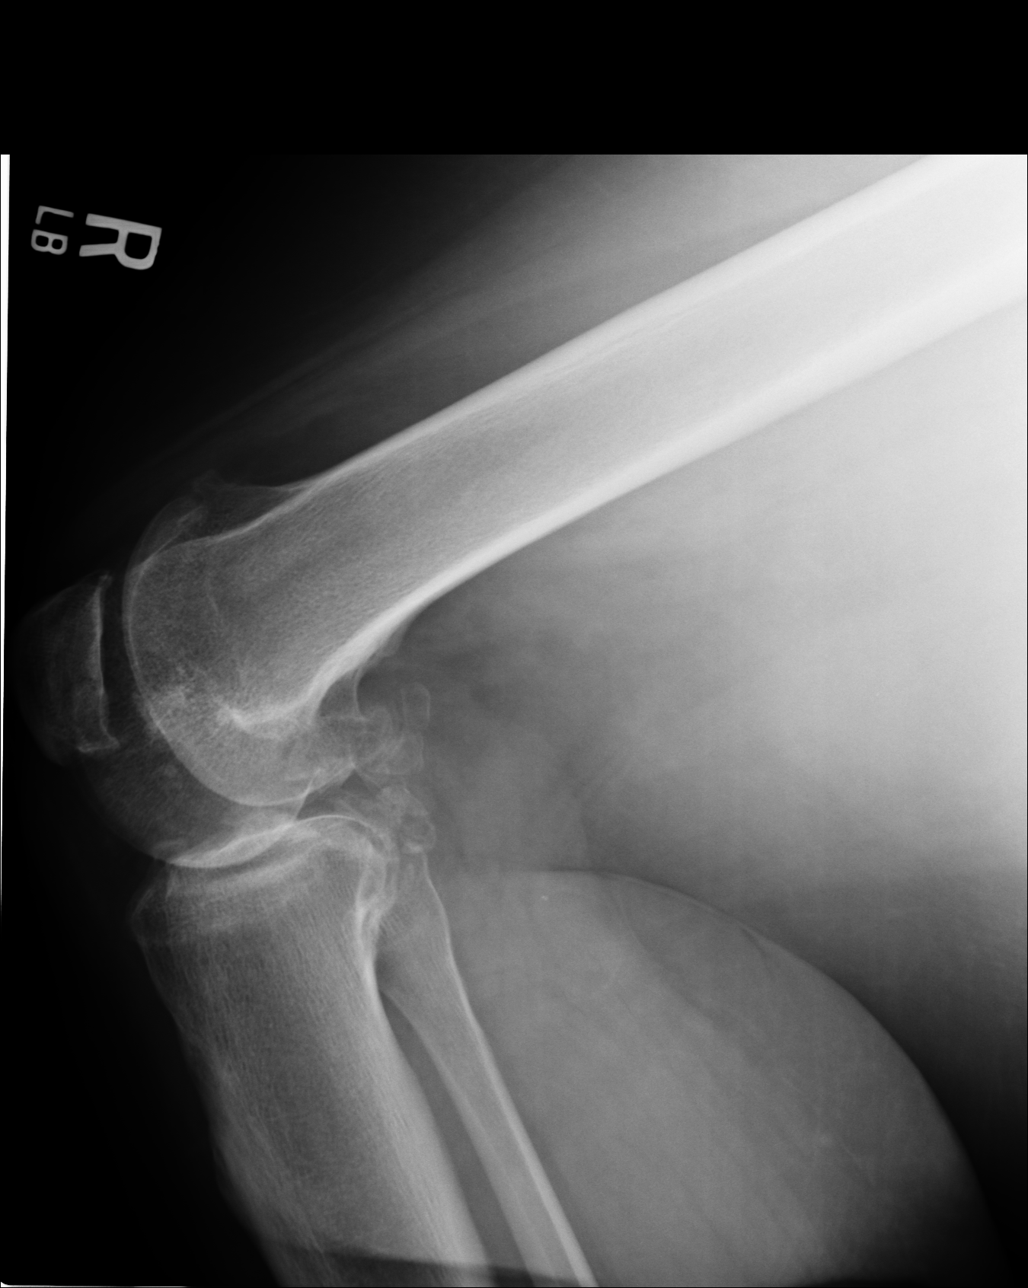
[im 4/4]
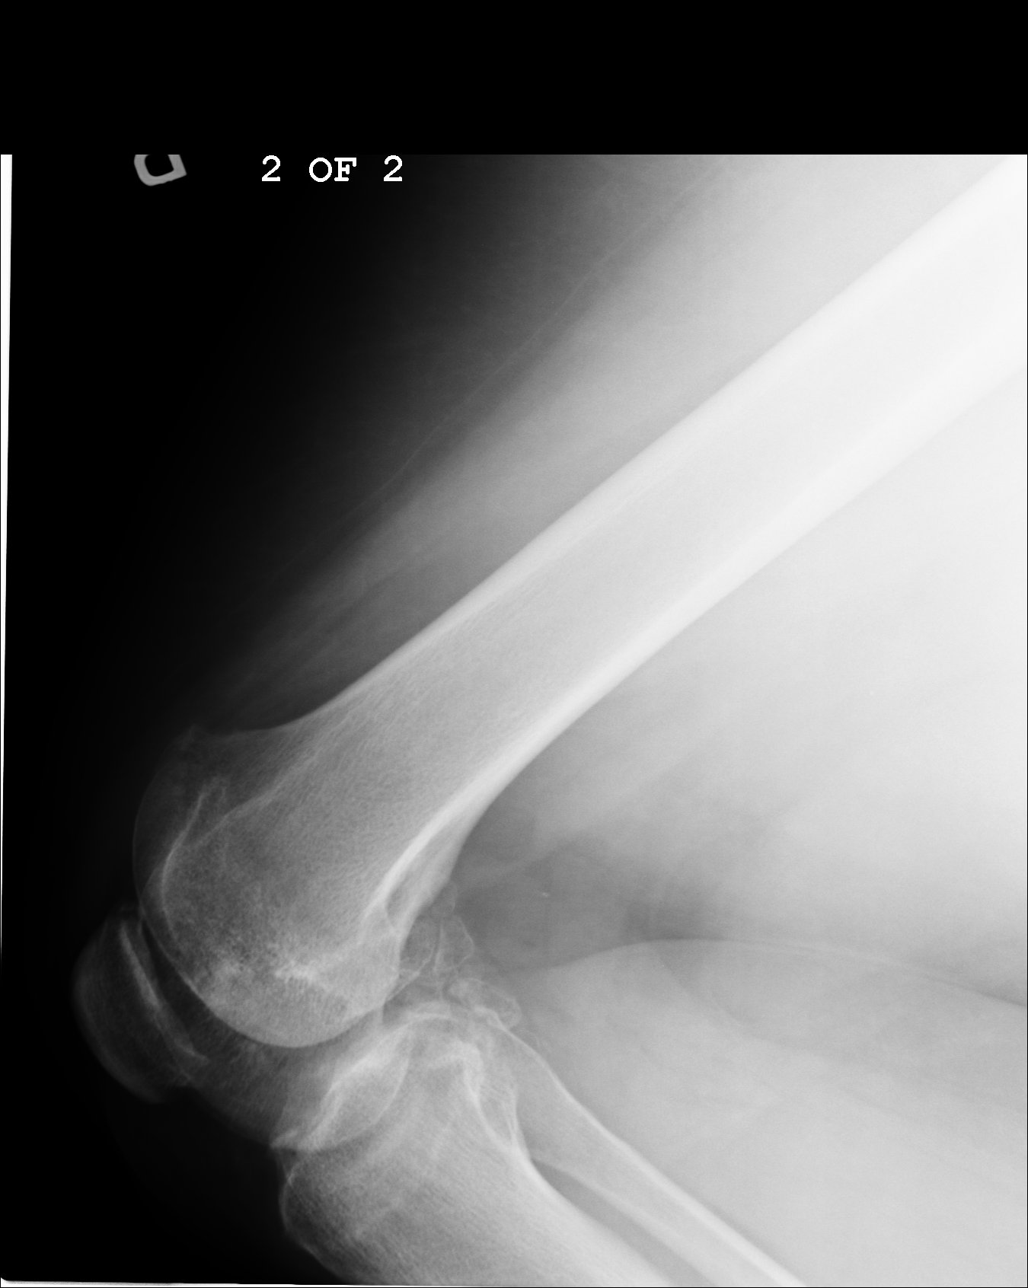

[4 of 4 positions shown; findings below may reference images not displayed]

PROCEDURE:     DXR - DXR KNEE RT COMP WITH OBLIQUES  - April 27, 2011 [DATE]

RESULT:     There is mild spur formation about the knee medially and
laterally. There is narrowing of the knee joint space medially consistent
with arthritic change. In the lateral view there is noted mild dorsal
patella spurring. Also noted in the lateral view are calcifications
posterior to the knee joint, likely representing synovial calcifications.
IMPRESSION: 1. Arthritic changes are noted about the knee with there being associated
narrowing of the knee joint space medially. No definite associated cystic
changes are noted in the femur or tibia.
2. There is mild dorsal patella spurring.
3. Calcifications are noted posterior to the knee joint, likely representing
synovial calcifications.

## 2012-02-07 IMAGING — MR MRI OF THE RIGHT KNEE WITHOUT CONTRAST
6 series · 33 of 40 positions shown · non-contrast
Comparison: None

REASON FOR EXAM: rt knee pain eval for meniscus tear
COMMENTS:

PROCEDURE:     MR  - MR KNEE RT  WO  CONTRAST  - April 27, 2011 [DATE]
RESULT:     History: Pain
TECHNIQUE: Multiplanar and multisequence MRI of the right knee was performed
without IV contrast.

[Series 10: T1 · axial · 4.0mm · 0.74mm/px · z∈[-15,+128]mm · 8 of 26 slices shown (1 of 2)]
[im 1/26]
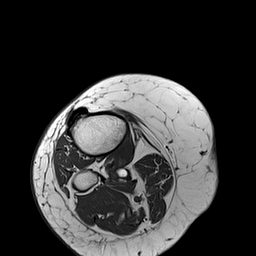
[im 4/26]
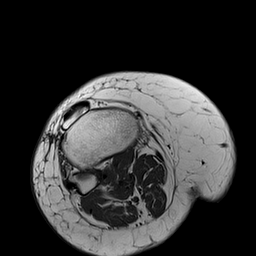
[im 8/26]
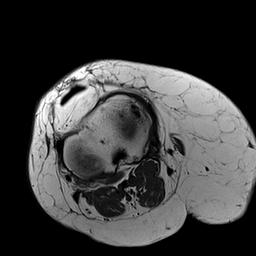
[im 11/26]
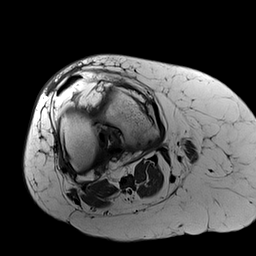
[im 15/26]
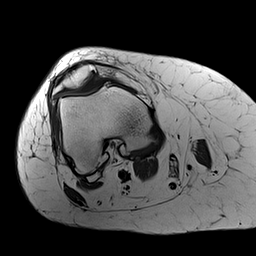
[im 18/26]
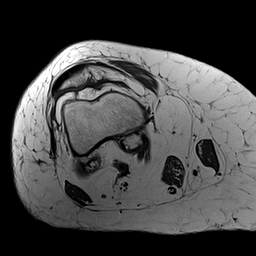
[im 22/26]
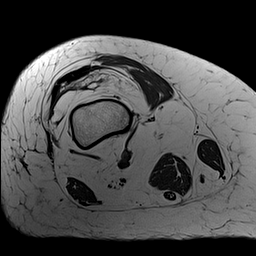
[im 26/26]
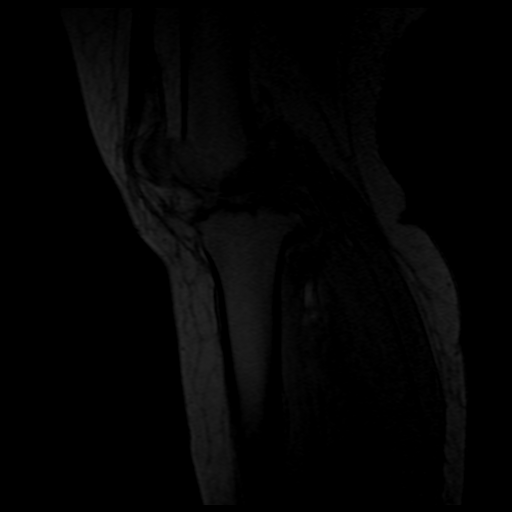

[Series 11: t2_tse_axial fatsat_ · axial · 4.0mm · 0.37mm/px · 1 of 26 slices shown]
[im 1/26]
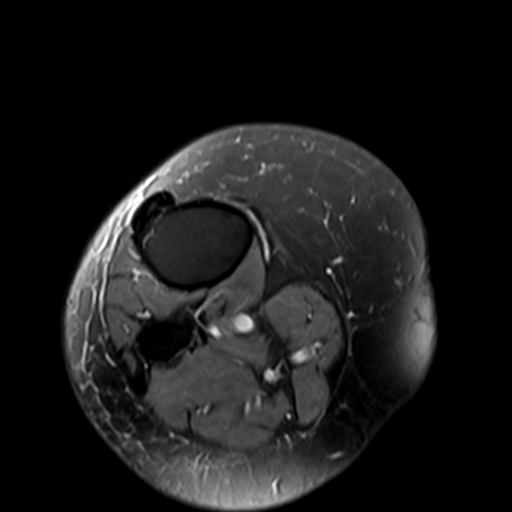

[Series 12: T2 fat-sat · axial · 4.0mm · 0.99mm/px · z∈[+46,+127]mm · 6 of 24 slices shown (1 of 2)]
[im 1/24]
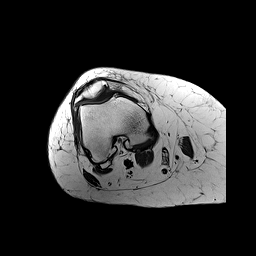
[im 5/24]
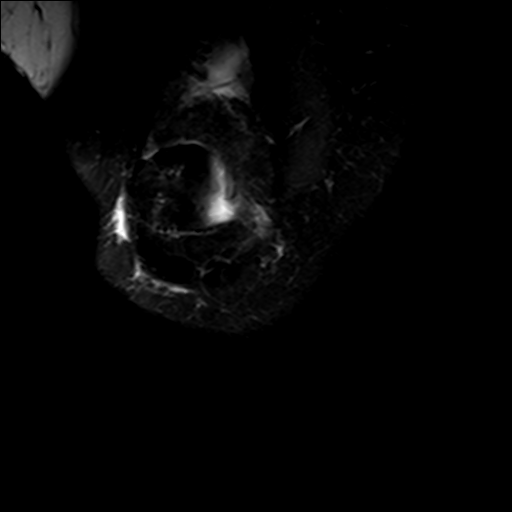
[im 10/24]
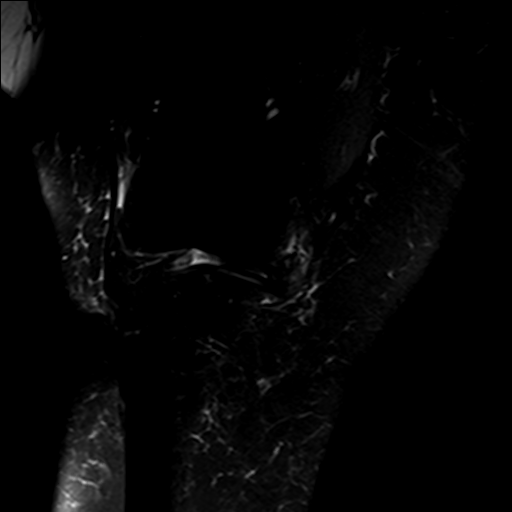
[im 14/24]
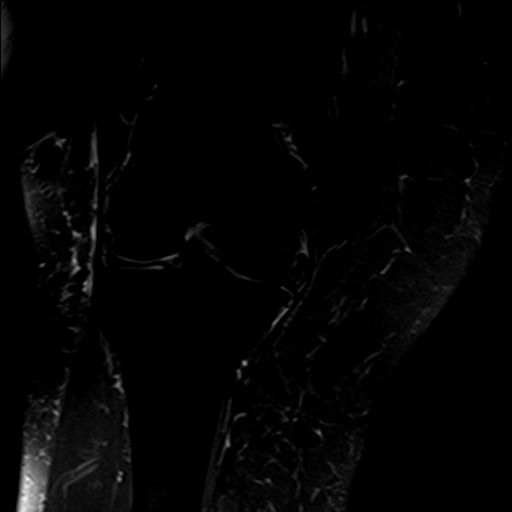
[im 19/24]
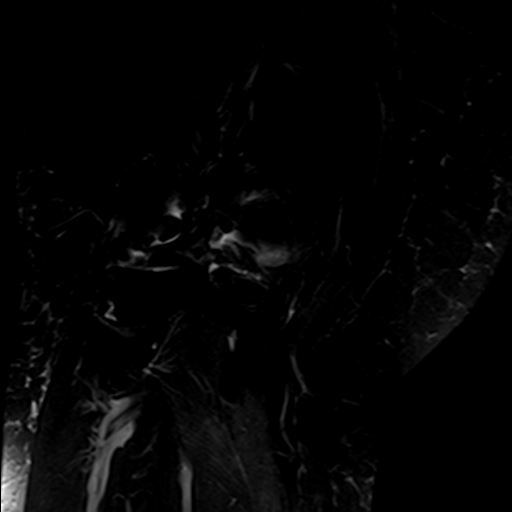
[im 24/24]
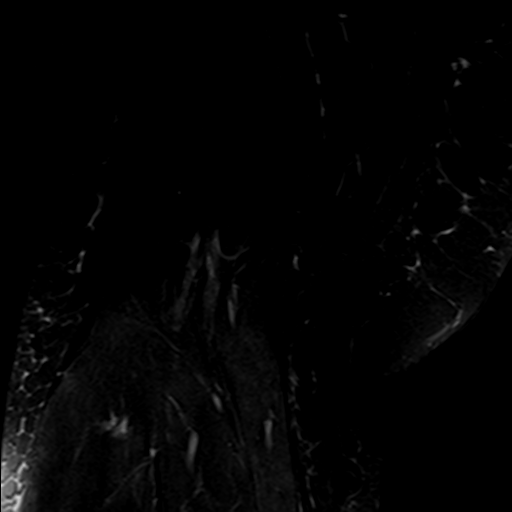

[Series 13: T1 · axial · 4.0mm · 0.99mm/px · z∈[+46,+127]mm · 6 of 24 slices shown (2 of 2)]
[im 1/24]
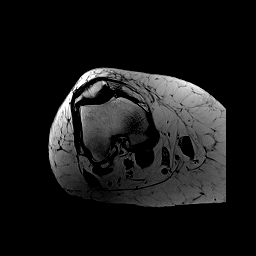
[im 5/24]
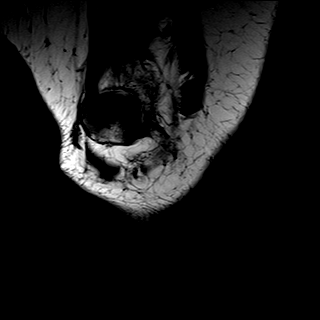
[im 10/24]
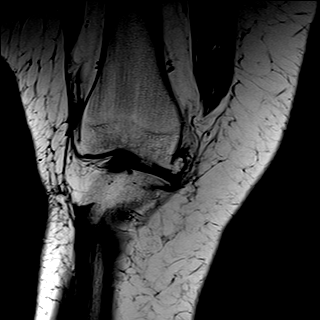
[im 14/24]
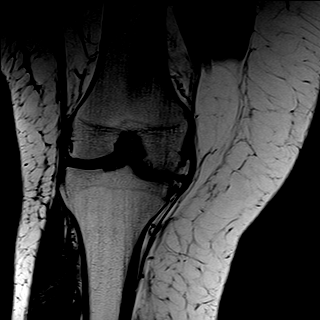
[im 19/24]
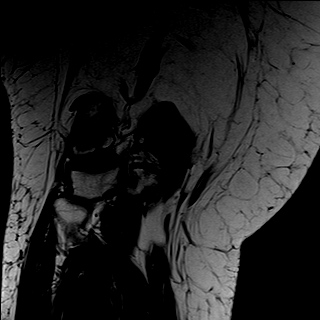
[im 24/24]
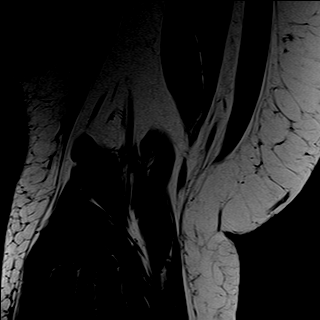

[Series 14: T2 fat-sat · axial · 4.0mm · 0.90mm/px · z∈[+47,+120]mm · 6 of 24 slices shown (2 of 2)]
[im 1/24]
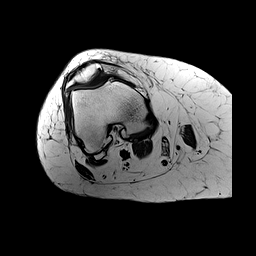
[im 5/24]
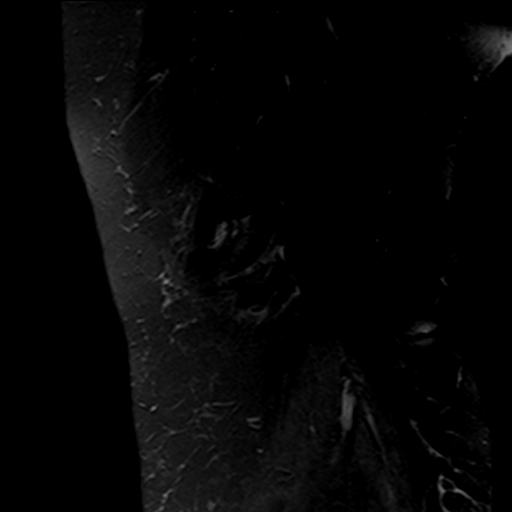
[im 10/24]
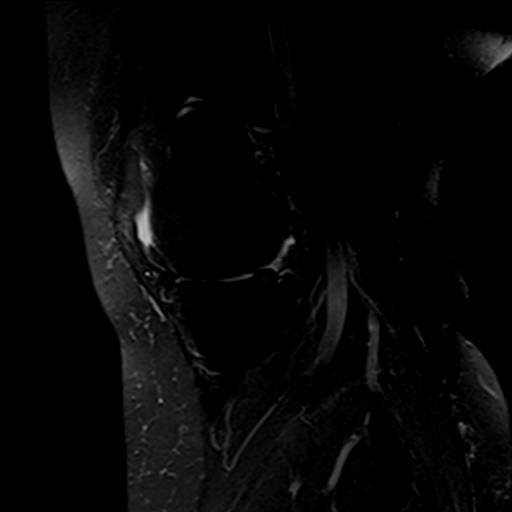
[im 14/24]
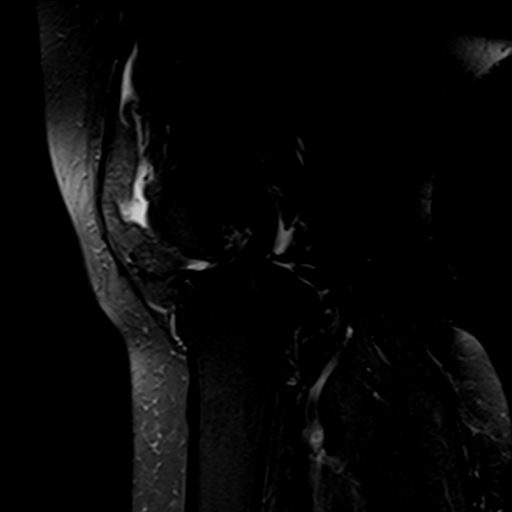
[im 19/24]
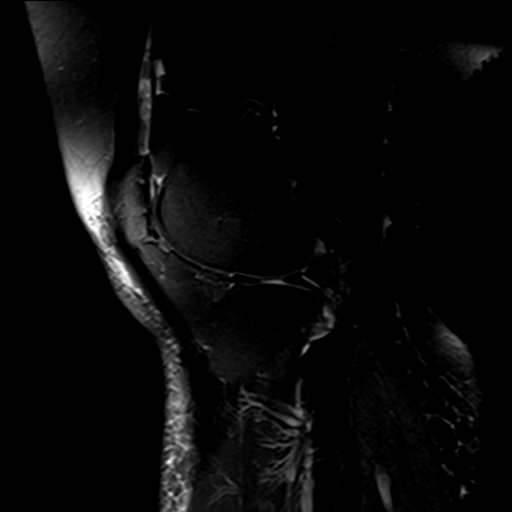
[im 24/24]
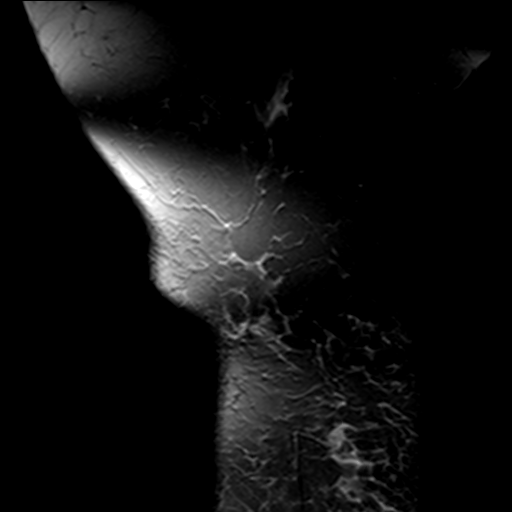

[Series 15: PD fat-sat · axial · 4.0mm · 0.90mm/px · z∈[+47,+120]mm · 6 of 24 slices shown]
[im 1/24]
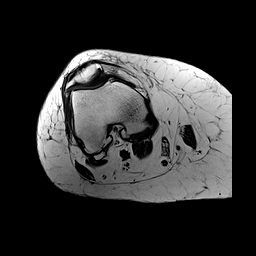
[im 5/24]
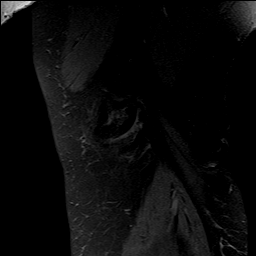
[im 10/24]
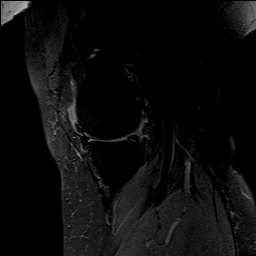
[im 14/24]
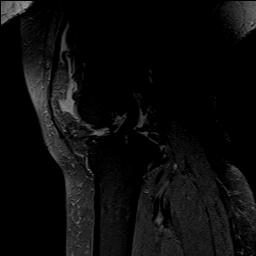
[im 19/24]
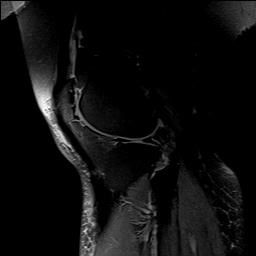
[im 24/24]
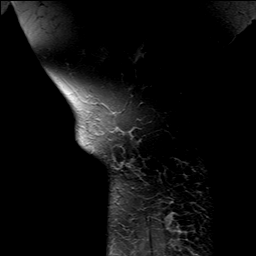

[33 of 40 positions shown; findings below may reference images not displayed]

FINDINGS: There is a radial tear of the free edge of the body of the medial meniscus.
The lateral meniscus is intact.

The anterior and posterior cruciate ligaments are intact.

The medial collateral ligament and lateral collateral ligamentous complex
are intact.  The popliteus tendon is intact.

The retinacular complex is intact. The extensor mechanism is intact.

There is full-thickness thickness cartilage loss of the lateral patellar
facet with subchondral reactive edema. There is full-thickness cartilage
loss of the medial femoral condyle and medial tibial plateau. There is
partial thickness cartilage loss of the lateral femoral condyle and lateral
tibial plateau. There are tricompartmental marginal osteophytes. There
multiple calcified loose bodies in the posterior knee joint.

There is no Baker's cyst. There is no abnormal signal within Hoffa's fat.
There is no plical thickening. There is no bursal abnormality. There is no
abnormal bone marrow signal. There is no significant joint effusion.
IMPRESSION: 1. Cartilage abnormalities as described above..

2. Radial tear of the free edge of the body of the medial meniscus.

## 2012-03-22 ENCOUNTER — Ambulatory Visit: Payer: Self-pay | Admitting: Orthopedic Surgery

## 2012-06-05 ENCOUNTER — Emergency Department: Payer: Self-pay | Admitting: Emergency Medicine

## 2012-06-05 LAB — COMPREHENSIVE METABOLIC PANEL
Albumin: 3.5 g/dL (ref 3.4–5.0)
Anion Gap: 6 — ABNORMAL LOW (ref 7–16)
BUN: 10 mg/dL (ref 7–18)
Creatinine: 0.94 mg/dL (ref 0.60–1.30)
Glucose: 213 mg/dL — ABNORMAL HIGH (ref 65–99)
Osmolality: 283 (ref 275–301)
SGOT(AST): 22 U/L (ref 15–37)
SGPT (ALT): 24 U/L
Total Protein: 7.9 g/dL (ref 6.4–8.2)

## 2012-06-05 LAB — URINALYSIS, COMPLETE
Bilirubin,UR: NEGATIVE
Ketone: NEGATIVE
Ph: 6 (ref 4.5–8.0)
RBC,UR: 2 /HPF (ref 0–5)
Specific Gravity: 1.02 (ref 1.003–1.030)
Squamous Epithelial: 2

## 2012-06-05 LAB — CBC
Platelet: 274 10*3/uL (ref 150–440)
RBC: 4.5 10*6/uL (ref 3.80–5.20)
RDW: 14 % (ref 11.5–14.5)
WBC: 6.3 10*3/uL (ref 3.6–11.0)

## 2012-08-03 ENCOUNTER — Emergency Department: Payer: Self-pay | Admitting: Emergency Medicine

## 2012-08-03 ENCOUNTER — Ambulatory Visit: Payer: Self-pay | Admitting: Family Medicine

## 2012-08-03 LAB — BASIC METABOLIC PANEL
Creatinine: 0.99 mg/dL (ref 0.60–1.30)
EGFR (Non-African Amer.): >60
Potassium: 4 mmol/L (ref 3.5–5.1)
Sodium: 138 mmol/L (ref 136–145)

## 2012-08-03 LAB — URIC ACID: Uric Acid: 4.3 mg/dL (ref 2.6–6.0)

## 2012-09-20 ENCOUNTER — Emergency Department: Payer: Self-pay | Admitting: Emergency Medicine

## 2012-09-20 LAB — CK TOTAL AND CKMB (NOT AT ARMC)
CK, Total: 114 U/L (ref 21–215)
CK-MB: 0.7 ng/mL (ref 0.5–3.6)

## 2012-09-20 LAB — COMPREHENSIVE METABOLIC PANEL
Albumin: 3.4 g/dL (ref 3.4–5.0)
Alkaline Phosphatase: 106 U/L (ref 50–136)
BUN: 11 mg/dL (ref 7–18)
Calcium, Total: 8.6 mg/dL (ref 8.5–10.1)
Chloride: 105 mmol/L (ref 98–107)
Co2: 26 mmol/L (ref 21–32)
Creatinine: 0.88 mg/dL (ref 0.60–1.30)
EGFR (African American): >60
EGFR (Non-African Amer.): >60
Osmolality: 285 (ref 275–301)
SGOT(AST): 22 U/L (ref 15–37)
SGPT (ALT): 28 U/L (ref 12–78)
Sodium: 138 mmol/L (ref 136–145)

## 2012-09-20 LAB — RAPID INFLUENZA A&B ANTIGENS

## 2012-09-20 LAB — CBC
HGB: 13.9 g/dL (ref 12.0–16.0)
MCH: 30.9 pg (ref 26.0–34.0)
MCV: 93 fL (ref 80–100)
Platelet: 284 10*3/uL (ref 150–440)
RBC: 4.49 10*6/uL (ref 3.80–5.20)
RDW: 13.6 % (ref 11.5–14.5)

## 2012-09-20 LAB — TROPONIN I: Troponin-I: <0.02 ng/mL

## 2012-10-12 ENCOUNTER — Ambulatory Visit: Payer: Self-pay | Admitting: Family Medicine

## 2012-11-06 ENCOUNTER — Ambulatory Visit: Payer: Self-pay | Admitting: Orthopedic Surgery

## 2012-11-06 LAB — BASIC METABOLIC PANEL
Anion Gap: 5 — ABNORMAL LOW (ref 7–16)
BUN: 8 mg/dL (ref 7–18)
Calcium, Total: 8.8 mg/dL (ref 8.5–10.1)
Chloride: 103 mmol/L (ref 98–107)
EGFR (African American): >60
Glucose: 243 mg/dL — ABNORMAL HIGH (ref 65–99)
Osmolality: 280 (ref 275–301)
Potassium: 4.1 mmol/L (ref 3.5–5.1)

## 2012-11-06 LAB — URINALYSIS, COMPLETE
Bacteria: NONE SEEN
Bilirubin,UR: NEGATIVE
Glucose,UR: >=500 mg/dL (ref 0–75)
Ketone: NEGATIVE
RBC,UR: 1 /HPF (ref 0–5)
Specific Gravity: 1.021 (ref 1.003–1.030)
Squamous Epithelial: <1
WBC UR: 3 /HPF (ref 0–5)

## 2012-11-06 LAB — PROTIME-INR
INR: 0.9
Prothrombin Time: 12.1 secs (ref 11.5–14.7)

## 2012-11-06 LAB — CBC
HCT: 42.4 % (ref 35.0–47.0)
HGB: 14.2 g/dL (ref 12.0–16.0)
MCH: 30.9 pg (ref 26.0–34.0)
MCV: 92 fL (ref 80–100)
Platelet: 313 10*3/uL (ref 150–440)
RBC: 4.6 10*6/uL (ref 3.80–5.20)
WBC: 7.5 10*3/uL (ref 3.6–11.0)

## 2012-11-22 HISTORY — PX: KNEE SURGERY: SHX244

## 2012-11-22 HISTORY — PX: JOINT REPLACEMENT: SHX530

## 2012-12-26 ENCOUNTER — Inpatient Hospital Stay: Payer: Self-pay | Admitting: Orthopedic Surgery

## 2012-12-26 LAB — BASIC METABOLIC PANEL
Anion Gap: 6 — ABNORMAL LOW (ref 7–16)
BUN: 15 mg/dL (ref 7–18)
Chloride: 103 mmol/L (ref 98–107)
Co2: 28 mmol/L (ref 21–32)
Creatinine: 0.96 mg/dL (ref 0.60–1.30)
EGFR (Non-African Amer.): >60
Osmolality: 281 (ref 275–301)

## 2012-12-26 LAB — URINALYSIS, COMPLETE
Bilirubin,UR: NEGATIVE
Glucose,UR: NEGATIVE mg/dL (ref 0–75)
Leukocyte Esterase: NEGATIVE
Nitrite: NEGATIVE
Ph: 5 (ref 4.5–8.0)
Protein: NEGATIVE
RBC,UR: 1 /HPF (ref 0–5)
WBC UR: 3 /HPF (ref 0–5)

## 2012-12-26 LAB — PROTIME-INR: Prothrombin Time: 13.4 secs (ref 11.5–14.7)

## 2012-12-26 LAB — CBC
HCT: 43.7 % (ref 35.0–47.0)
MCH: 30 pg (ref 26.0–34.0)
MCHC: 32.5 g/dL (ref 32.0–36.0)
MCV: 92 fL (ref 80–100)
RBC: 4.74 10*6/uL (ref 3.80–5.20)
RDW: 14.2 % (ref 11.5–14.5)

## 2012-12-26 LAB — APTT: Activated PTT: 29.7 secs (ref 23.6–35.9)

## 2012-12-27 LAB — BASIC METABOLIC PANEL
Chloride: 98 mmol/L (ref 98–107)
Co2: 27 mmol/L (ref 21–32)
Creatinine: 0.87 mg/dL (ref 0.60–1.30)
EGFR (African American): >60
EGFR (Non-African Amer.): >60
Glucose: 249 mg/dL — ABNORMAL HIGH (ref 65–99)
Potassium: 4.1 mmol/L (ref 3.5–5.1)
Sodium: 131 mmol/L — ABNORMAL LOW (ref 136–145)

## 2012-12-27 LAB — CBC WITH DIFFERENTIAL/PLATELET
Basophil #: 0 10*3/uL (ref 0.0–0.1)
Lymphocyte #: 1.7 10*3/uL (ref 1.0–3.6)
Lymphocyte %: 13.1 %
MCH: 30.1 pg (ref 26.0–34.0)
MCHC: 32.3 g/dL (ref 32.0–36.0)
MCV: 93 fL (ref 80–100)
Monocyte #: 1 x10 3/mm — ABNORMAL HIGH (ref 0.2–0.9)
Monocyte %: 7.8 %
Neutrophil %: 78.7 %
Platelet: 254 10*3/uL (ref 150–440)
WBC: 13.1 10*3/uL — ABNORMAL HIGH (ref 3.6–11.0)

## 2012-12-28 LAB — BASIC METABOLIC PANEL
BUN: 7 mg/dL (ref 7–18)
Calcium, Total: 8.4 mg/dL — ABNORMAL LOW (ref 8.5–10.1)
Chloride: 99 mmol/L (ref 98–107)
Co2: 27 mmol/L (ref 21–32)
Creatinine: 1.12 mg/dL (ref 0.60–1.30)
EGFR (African American): >60
Osmolality: 274 (ref 275–301)
Potassium: 4.2 mmol/L (ref 3.5–5.1)
Sodium: 134 mmol/L — ABNORMAL LOW (ref 136–145)

## 2012-12-28 LAB — CBC WITH DIFFERENTIAL/PLATELET
Basophil #: 0 10*3/uL (ref 0.0–0.1)
Eosinophil %: 0.5 %
HCT: 37.7 % (ref 35.0–47.0)
HGB: 12.3 g/dL (ref 12.0–16.0)
Lymphocyte #: 2 10*3/uL (ref 1.0–3.6)
MCH: 30.5 pg (ref 26.0–34.0)
Monocyte #: 1.2 x10 3/mm — ABNORMAL HIGH (ref 0.2–0.9)
Monocyte %: 9.1 %
Neutrophil %: 74.3 %
RDW: 14.6 % — ABNORMAL HIGH (ref 11.5–14.5)

## 2012-12-28 LAB — URINALYSIS, COMPLETE
Bilirubin,UR: NEGATIVE
Glucose,UR: >=500 mg/dL (ref 0–75)
Ketone: NEGATIVE
Nitrite: NEGATIVE
Ph: 6 (ref 4.5–8.0)
Protein: 100
RBC,UR: 642 /HPF (ref 0–5)
Squamous Epithelial: 1

## 2013-01-02 IMAGING — CR DG KNEE 1-2V BILAT
1 series · 5 of 5 positions shown · non-contrast
Comparison: none

REASON FOR EXAM: knee pain  bilateral standing ap  flexion  laterals and
sunrise
COMMENTS:

[Series 1: ap · 0.17mm/px · 5 of 5 slices shown]
[im 1/5]
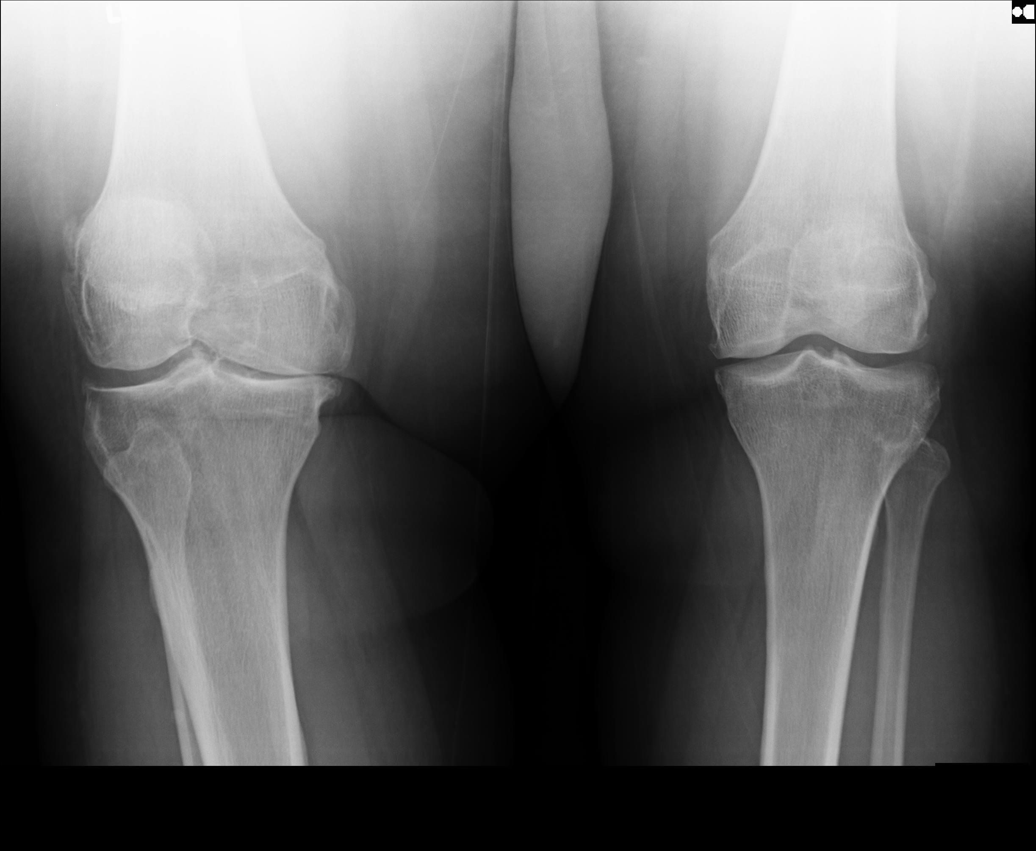
[im 2/5]
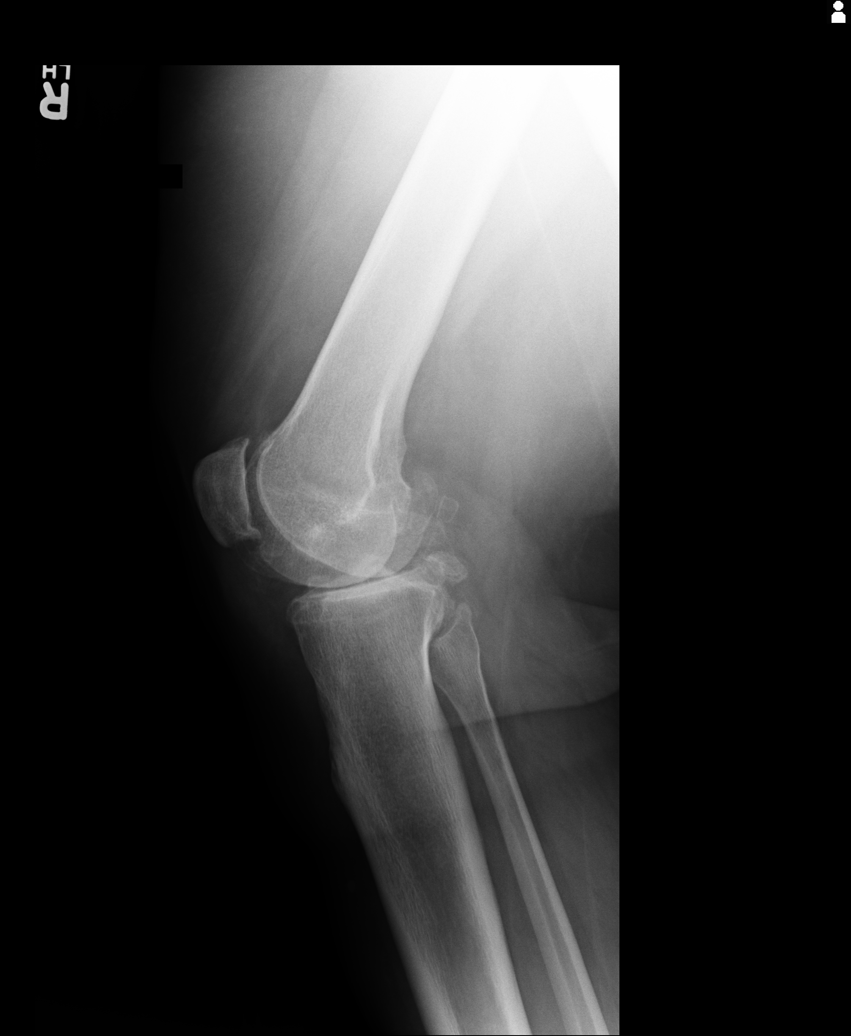
[im 3/5]
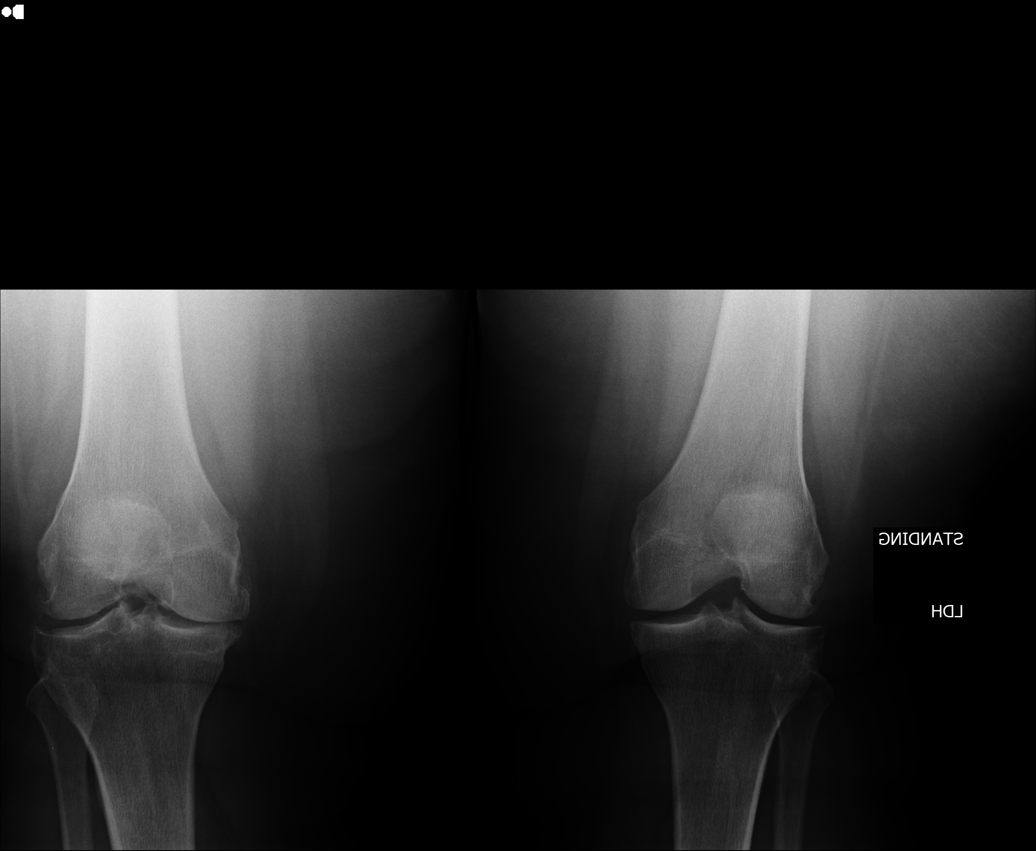
[im 4/5]
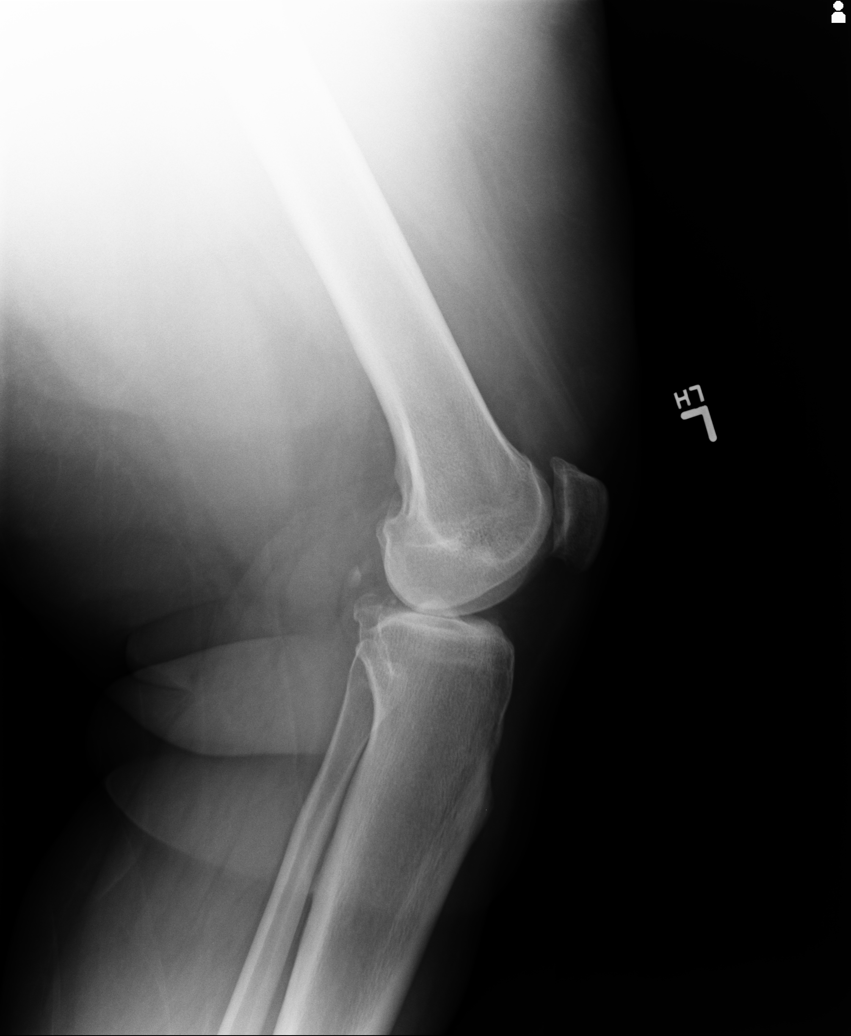
[im 5/5]
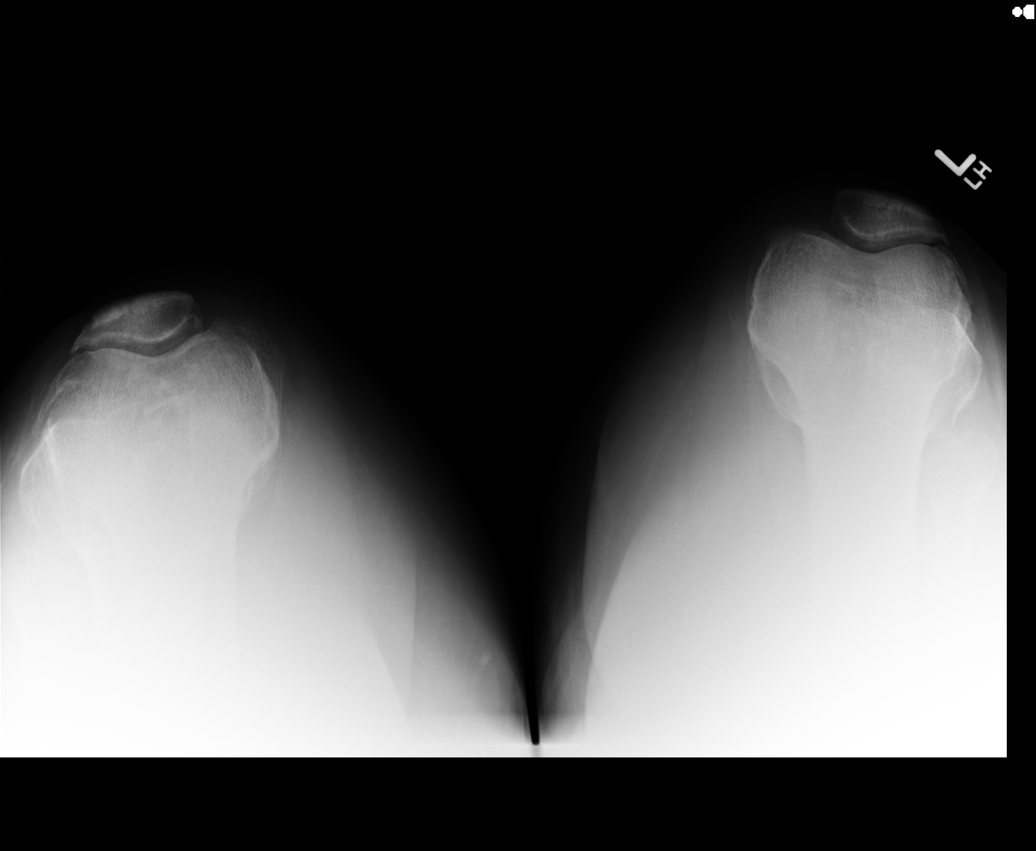

[5 of 5 positions shown; findings below may reference images not displayed]

PROCEDURE:     KDR - KDXR BILATERAL AP/LAT KNEES  - March 22, 2012  [DATE]

RESULT:     Findings: Bilateral standing AP, and notch, lateral and sunrise
views of the knees are provided.

The right knee images no fracture or dislocation. There is degenerative
joint disease of the medial tibial femoral compartment with severe joint
space narrowing. There mild lateral tibiofemoral compartment and
patellofemoral compartment osteophytes. There is a joint effusion.

The left knee demonstrates no fracture or dislocation. There is no
significant joint space narrowing. There is no joint effusion.
IMPRESSION: Degenerative joint disease of the right knee most severe in the medial
tibiofemoral compartment.

## 2013-01-22 ENCOUNTER — Emergency Department: Payer: Self-pay | Admitting: Emergency Medicine

## 2013-02-01 ENCOUNTER — Ambulatory Visit: Payer: Self-pay | Admitting: Orthopedic Surgery

## 2013-05-01 ENCOUNTER — Emergency Department: Payer: Self-pay | Admitting: Emergency Medicine

## 2013-05-01 LAB — CBC
HCT: 39.2 % (ref 35.0–47.0)
HGB: 12.8 g/dL (ref 12.0–16.0)
MCH: 29.2 pg (ref 26.0–34.0)
MCHC: 32.6 g/dL (ref 32.0–36.0)
MCV: 90 fL (ref 80–100)
RDW: 15.7 % — ABNORMAL HIGH (ref 11.5–14.5)

## 2013-05-01 LAB — HCG, QUANTITATIVE, PREGNANCY: Beta Hcg, Quant.: <1 m[IU]/mL — ABNORMAL LOW

## 2013-05-01 LAB — URINALYSIS, COMPLETE
Nitrite: NEGATIVE
Squamous Epithelial: 1

## 2013-05-16 IMAGING — US US EXTREM LOW VENOUS*R*
1 series · 14 of 22 positions shown · non-contrast
Comparison: none

REASON FOR EXAM: CALL REPORT [id number] Nomasibulele cell Right calf pain Eval for
DVT
COMMENTS:

PROCEDURE:     US  - US DOPPLER LOW EXTR RIGHT  - August 03, 2012  [DATE]
RESULT:     Right lower extremity color flow duplex Doppler reveals no
evidence of thrombus.

[Series 1: us extrem low venous*right* · 0.09mm/px · 14 of 22 slices shown]
[im 1/22]
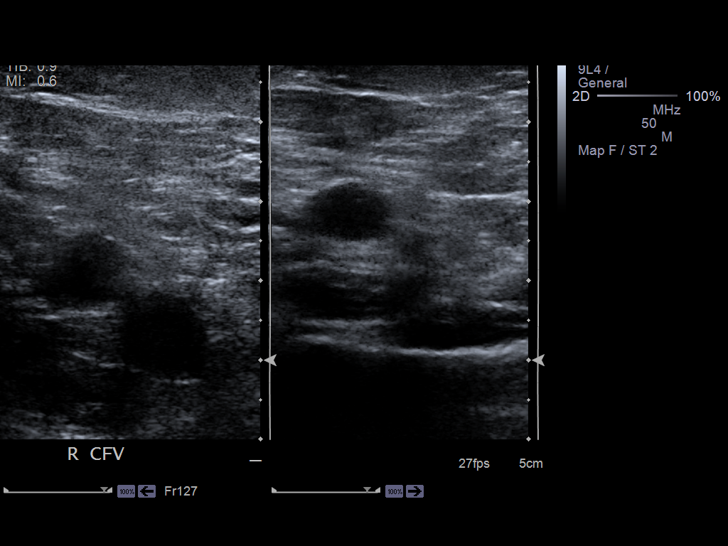
[im 3/22]
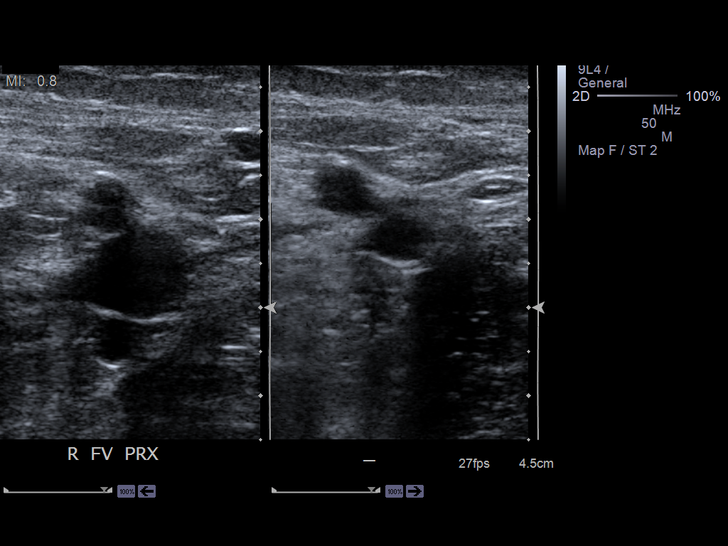
[im 4/22]
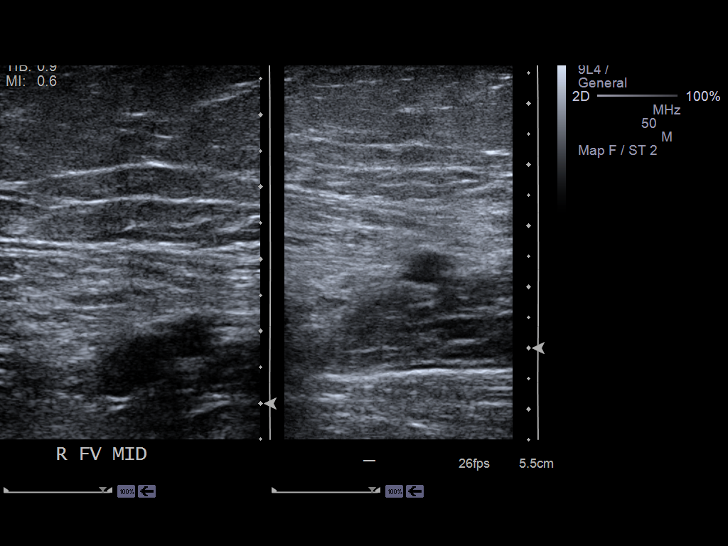
[im 6/22]
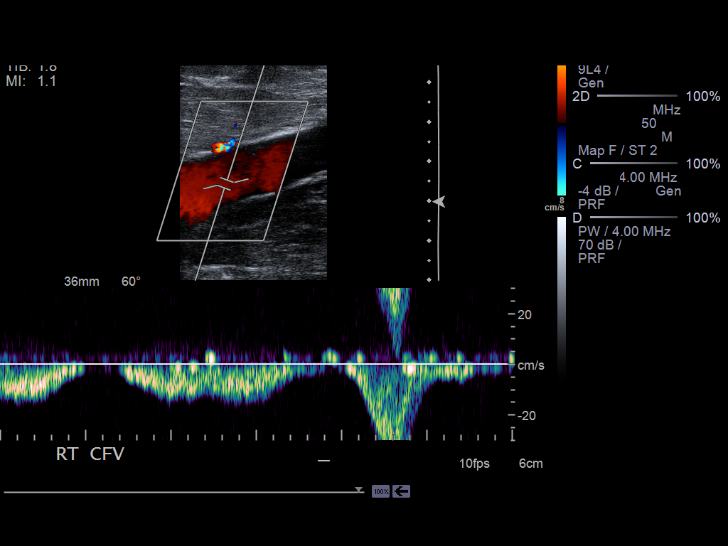
[im 8/22]
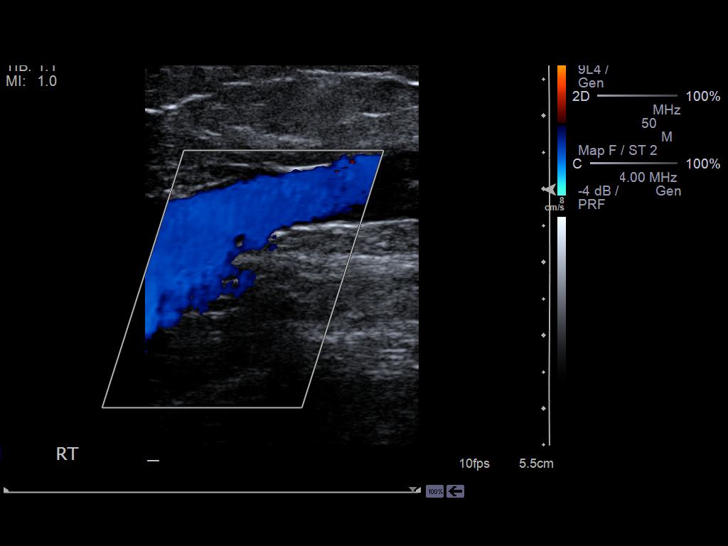
[im 9/22]
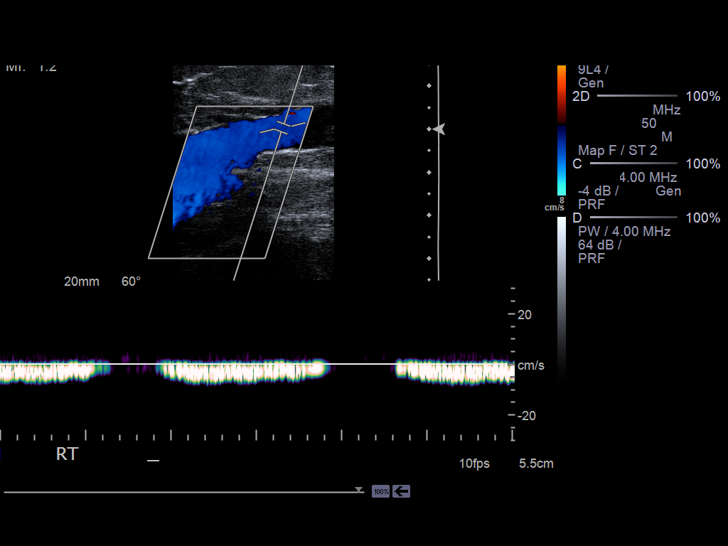
[im 11/22]
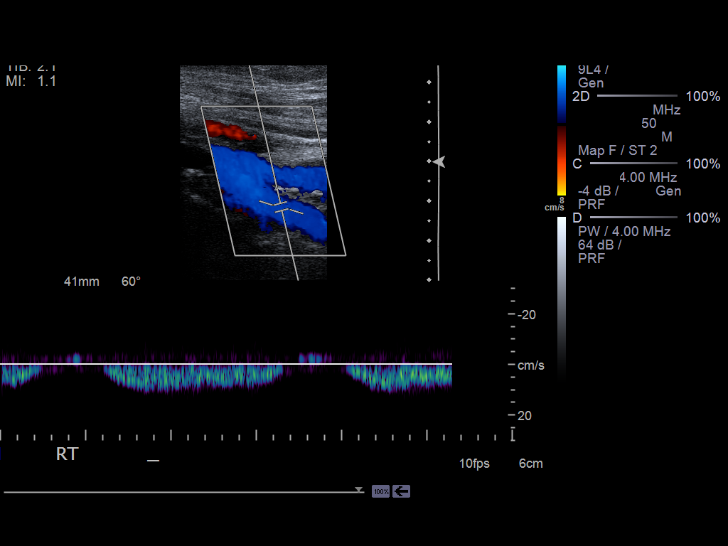
[im 12/22]
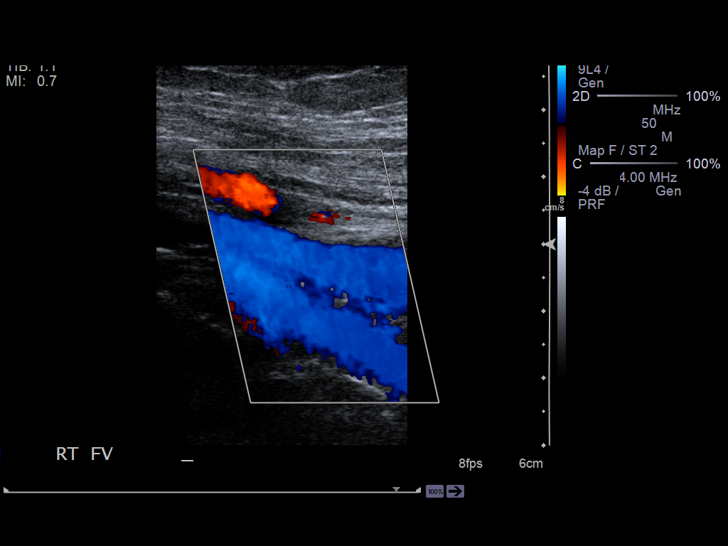
[im 14/22]
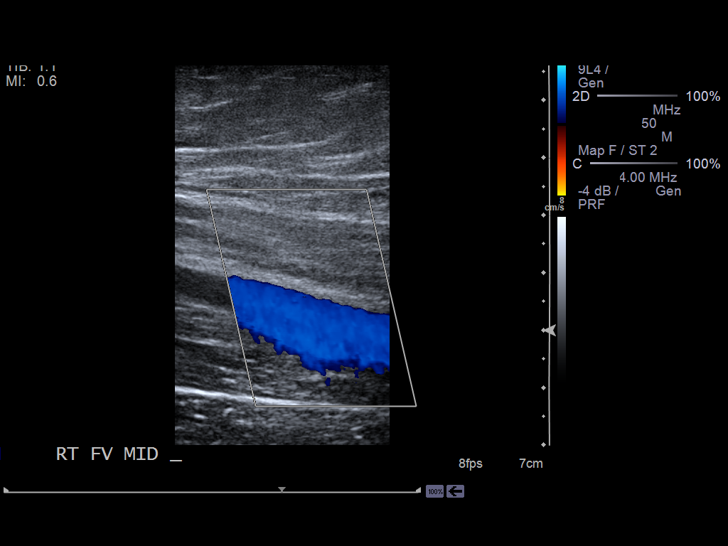
[im 15/22]
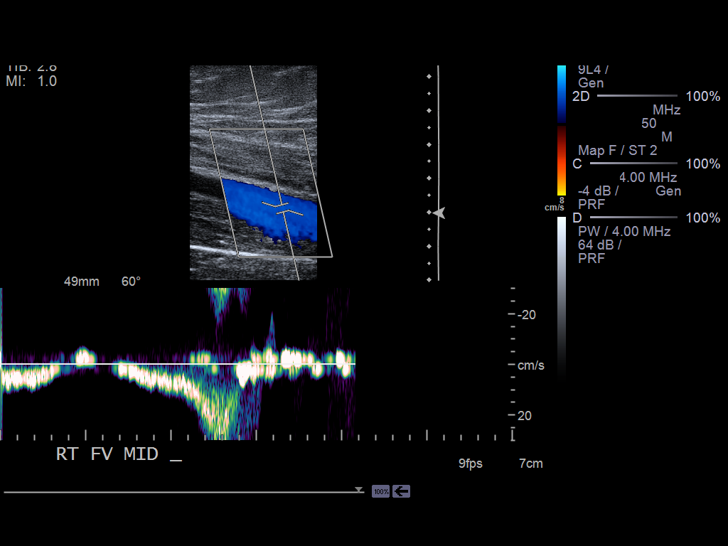
[im 17/22]
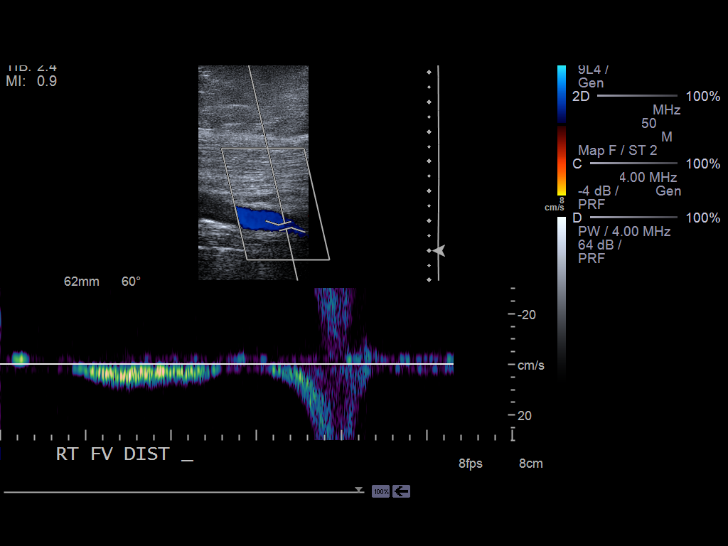
[im 19/22]
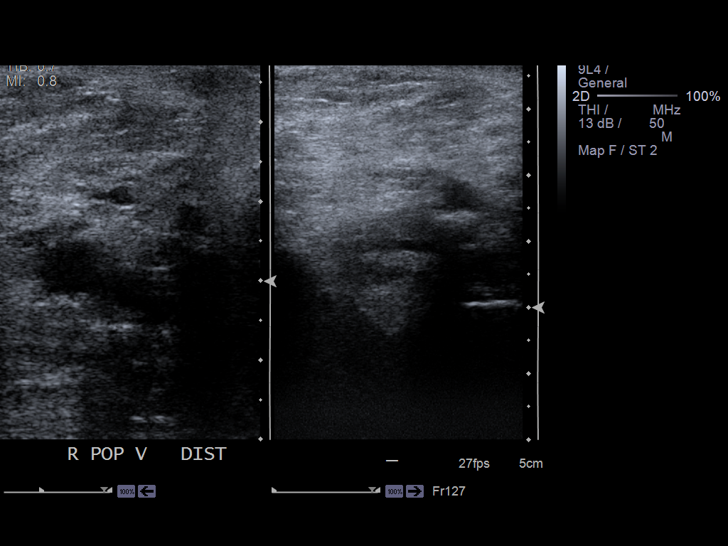
[im 20/22]
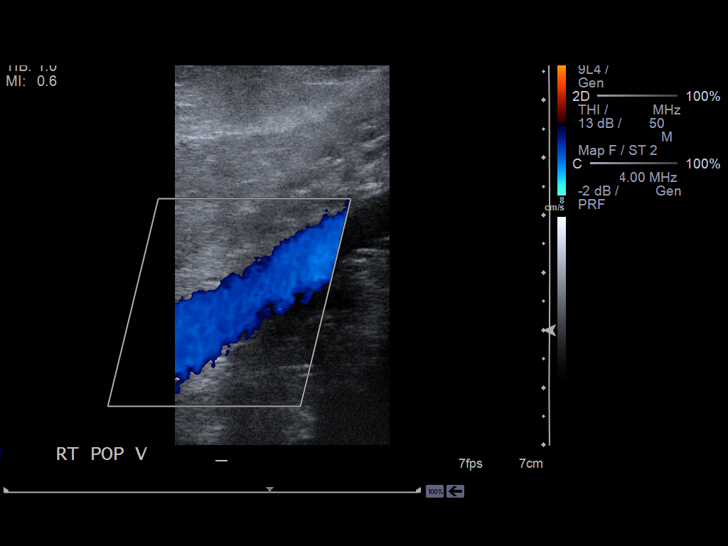
[im 22/22]
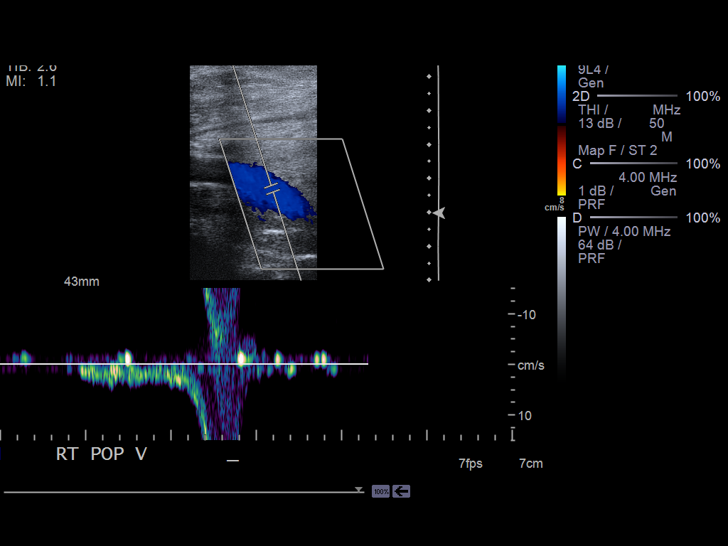

[14 of 22 positions shown; findings below may reference images not displayed]

IMPRESSION: Negative exam.

## 2013-05-16 IMAGING — CR DG KNEE COMPLETE 4+V*R*
1 series · 4 of 4 positions shown · non-contrast
Comparison: none

REASON FOR EXAM: pain
COMMENTS:   LMP: > one month ago

PROCEDURE:     DXR - DXR KNEE RT COMP WITH OBLIQUES  - August 03, 2012  [DATE]
RESULT:     Comparison:  None

[Series 1: t knee ap right · 0.14mm/px · 4 of 4 slices shown]
[im 1/4]
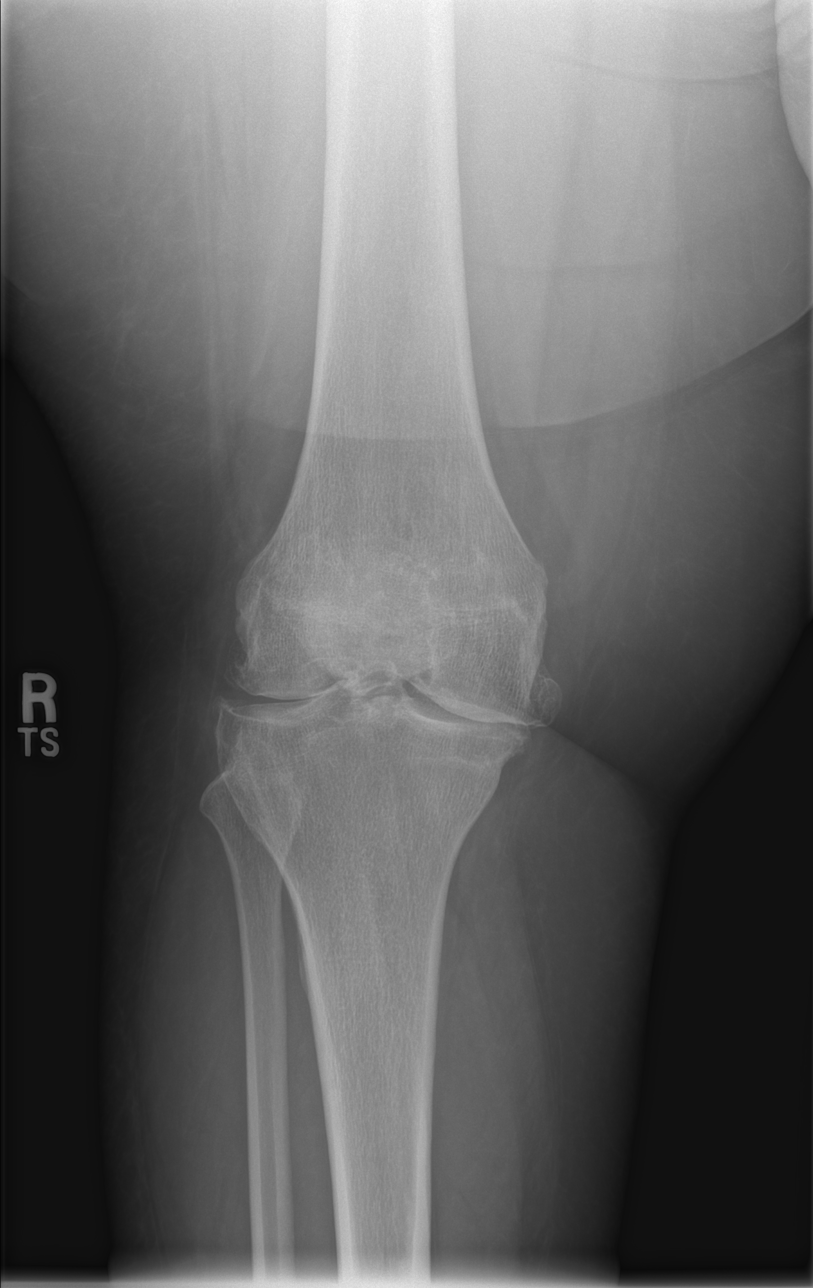
[im 2/4]
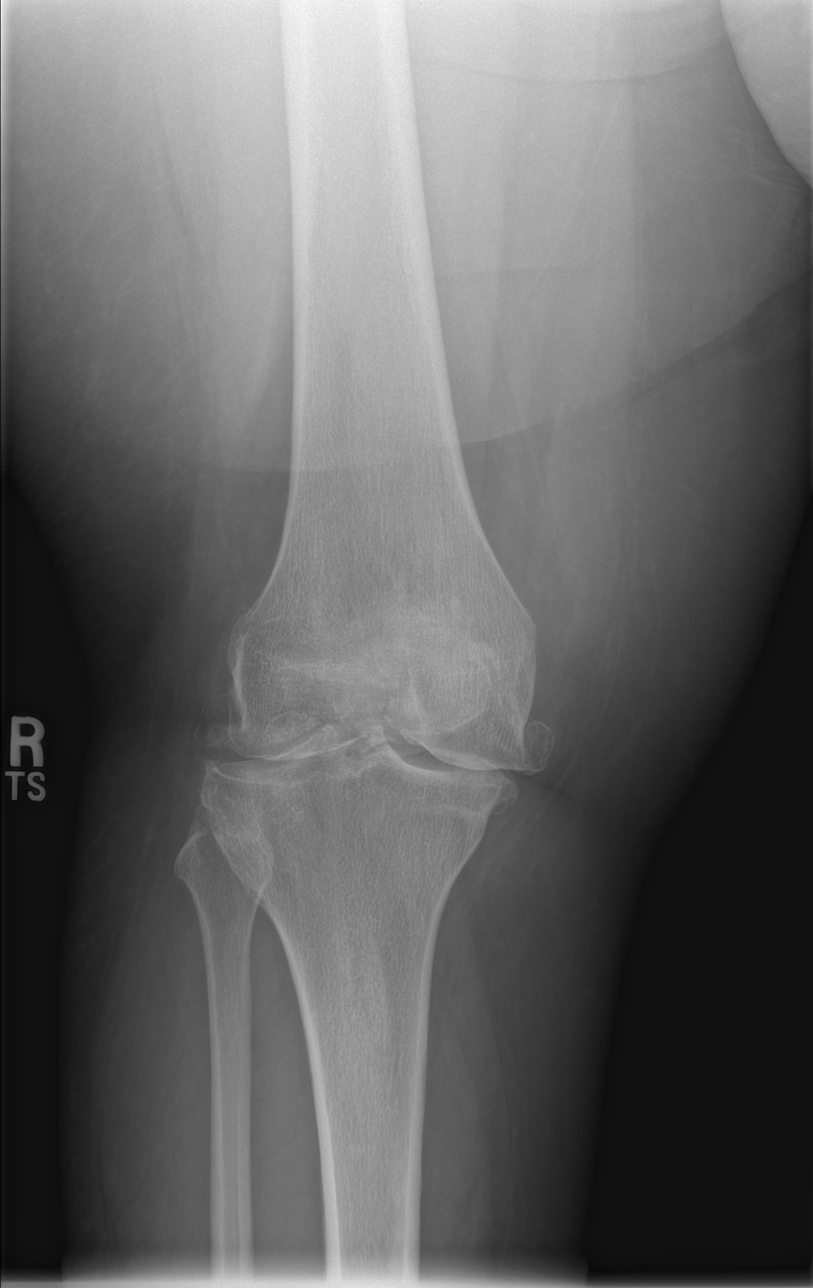
[im 3/4]
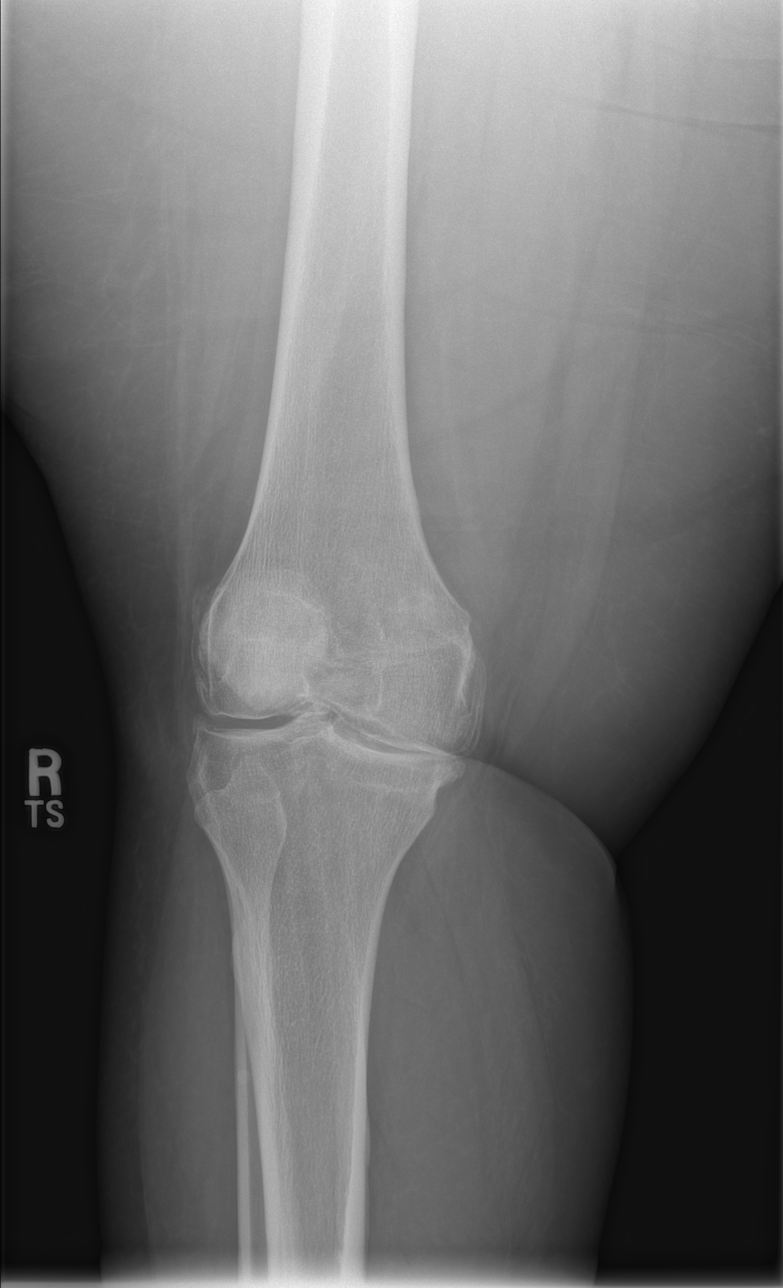
[im 4/4]
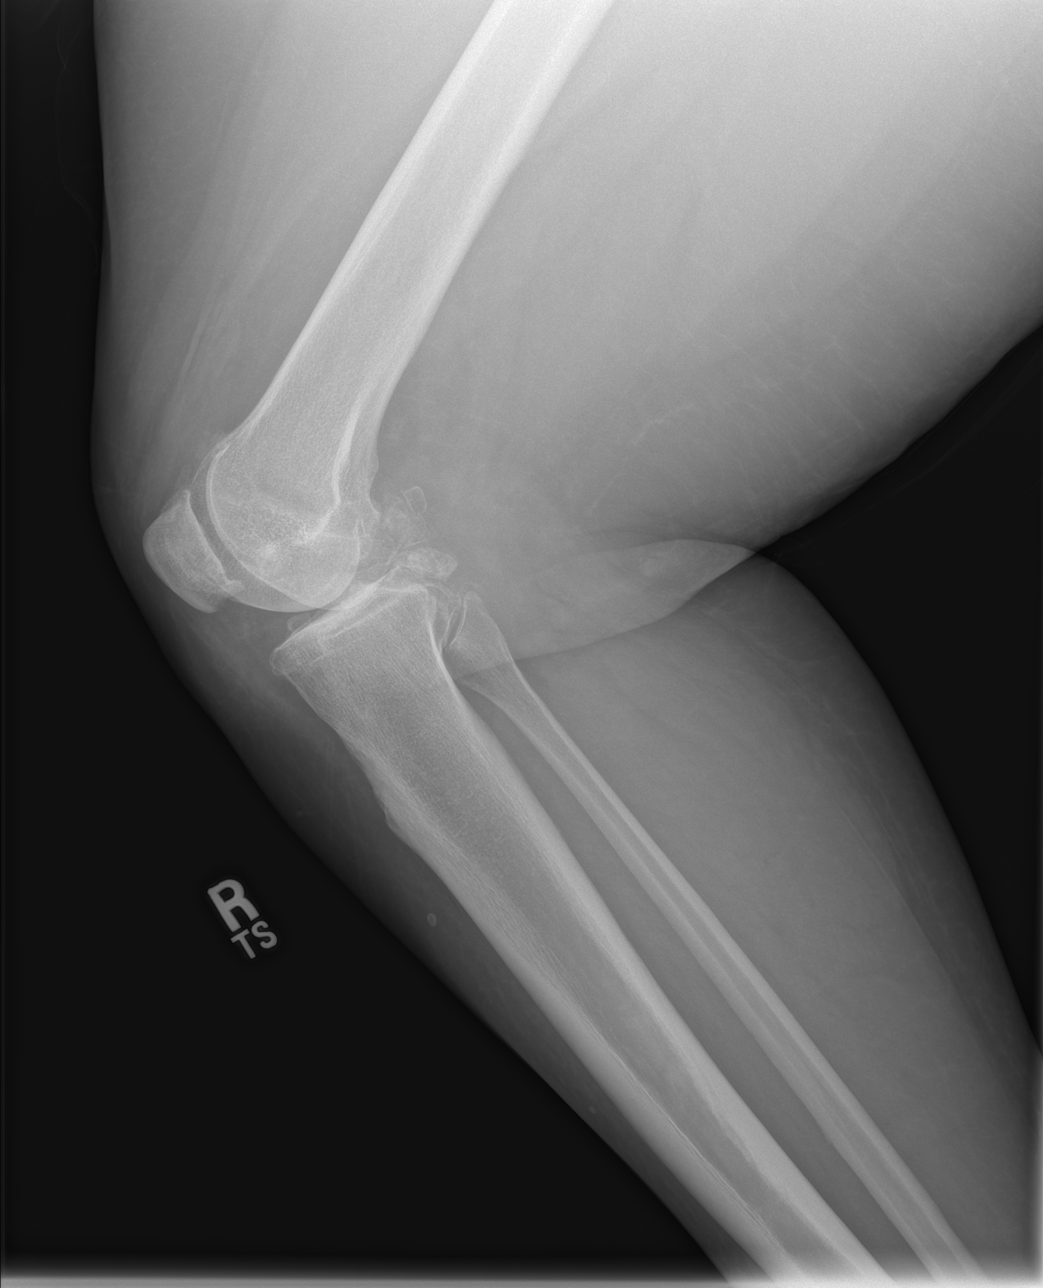

[4 of 4 positions shown; findings below may reference images not displayed]

FINDINGS: 4 views of the right knee demonstrates no acute fracture or dislocation.
There is no significant joint effusion. There is severe tricompartmental
degenerative changes of the right knee.
IMPRESSION: No acute osseous injury of the right knee.

Severe right knee degenerative joint disease.

[REDACTED]

## 2013-07-03 IMAGING — CR DG CHEST 2V
1 series · 2 of 2 positions shown · non-contrast
Comparison: none

REASON FOR EXAM: Chest Pain
COMMENTS:

PROCEDURE:     DXR - DXR CHEST PA (OR AP) AND LATERAL  - September 20, 2012  [DATE]
RESULT:     The lungs are clear. The heart and pulmonary vessels are normal.
The bony and mediastinal structures are unremarkable. There is no effusion.
There is no pneumothorax or evidence of congestive failure.

[Series 1: w chest pa · 0.14mm/px · 2 of 2 slices shown]
[im 1/2]
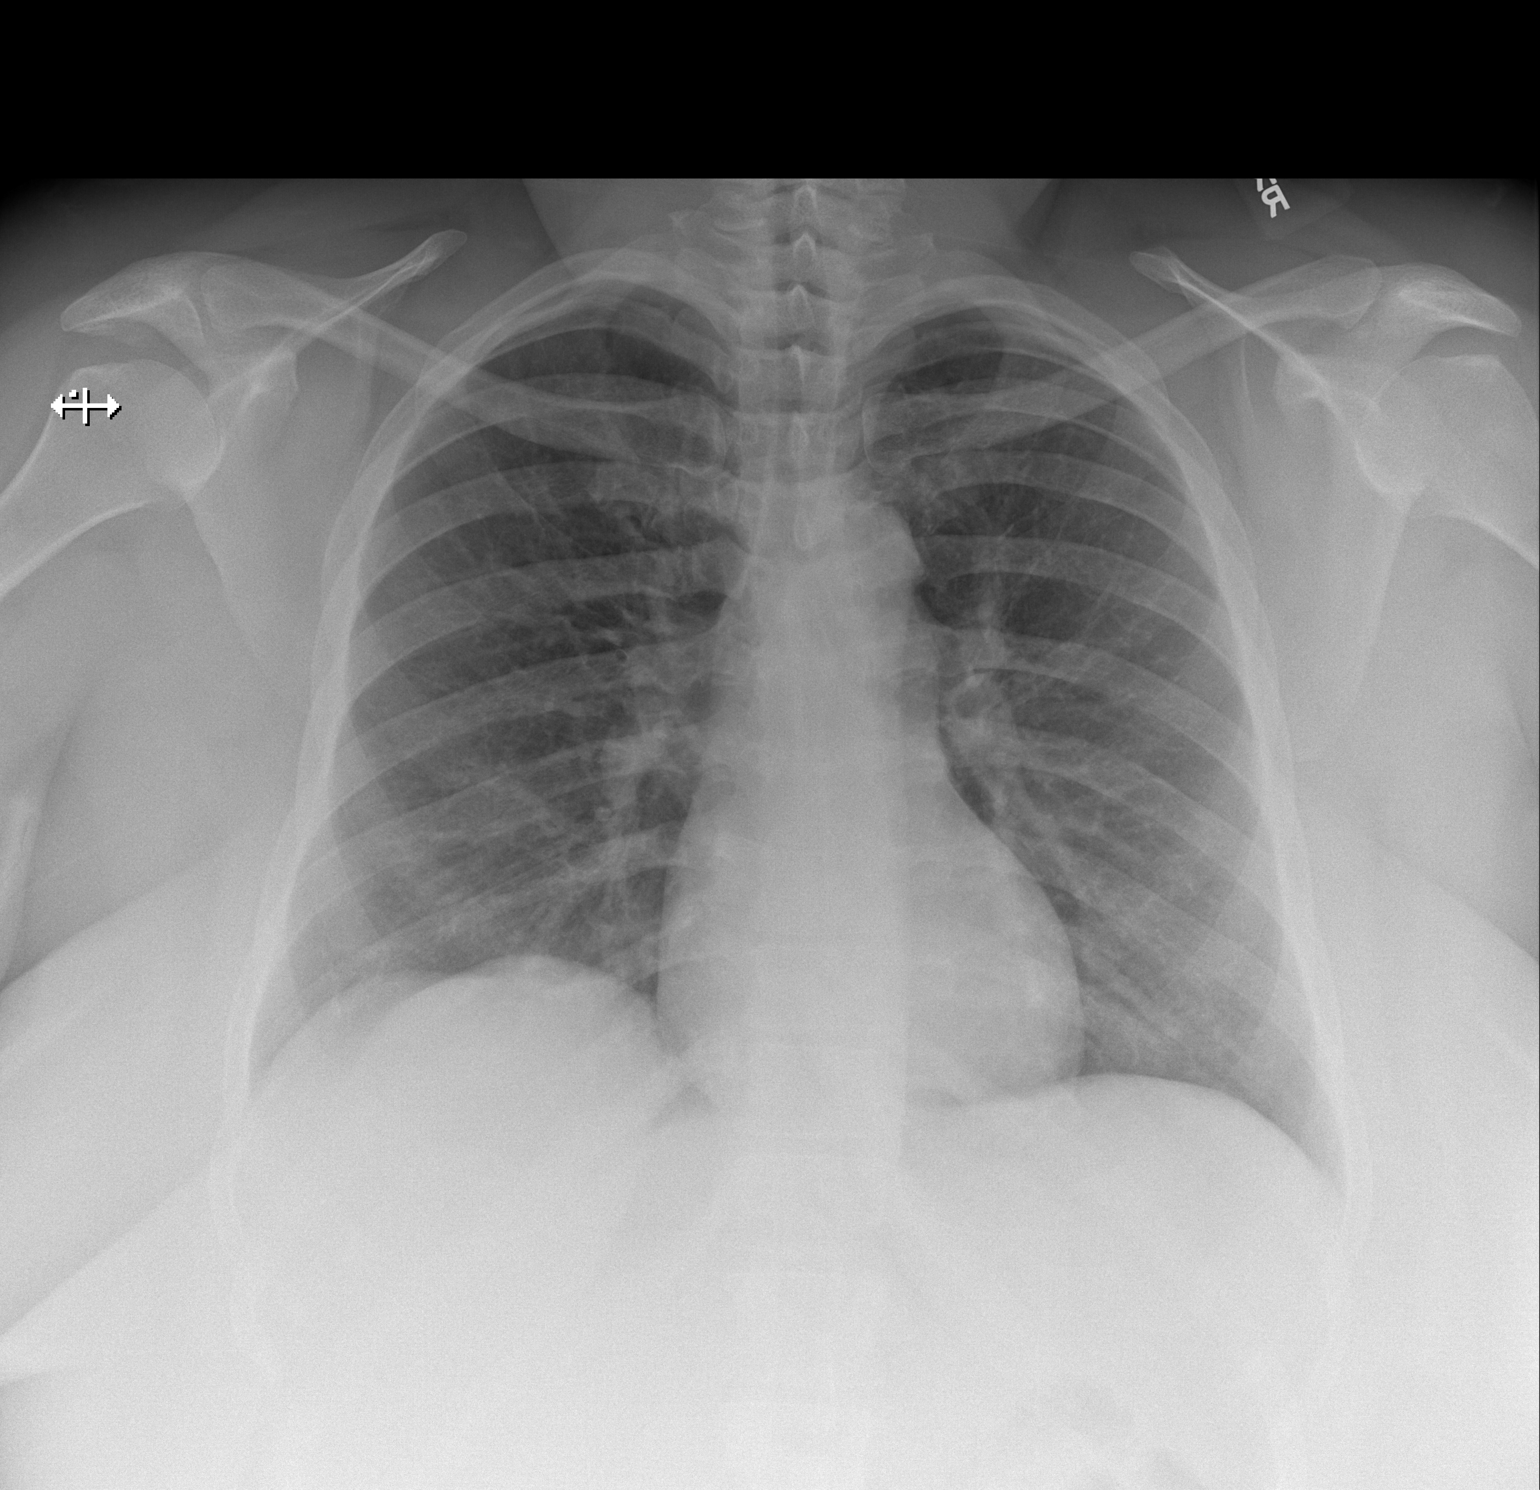
[im 2/2]
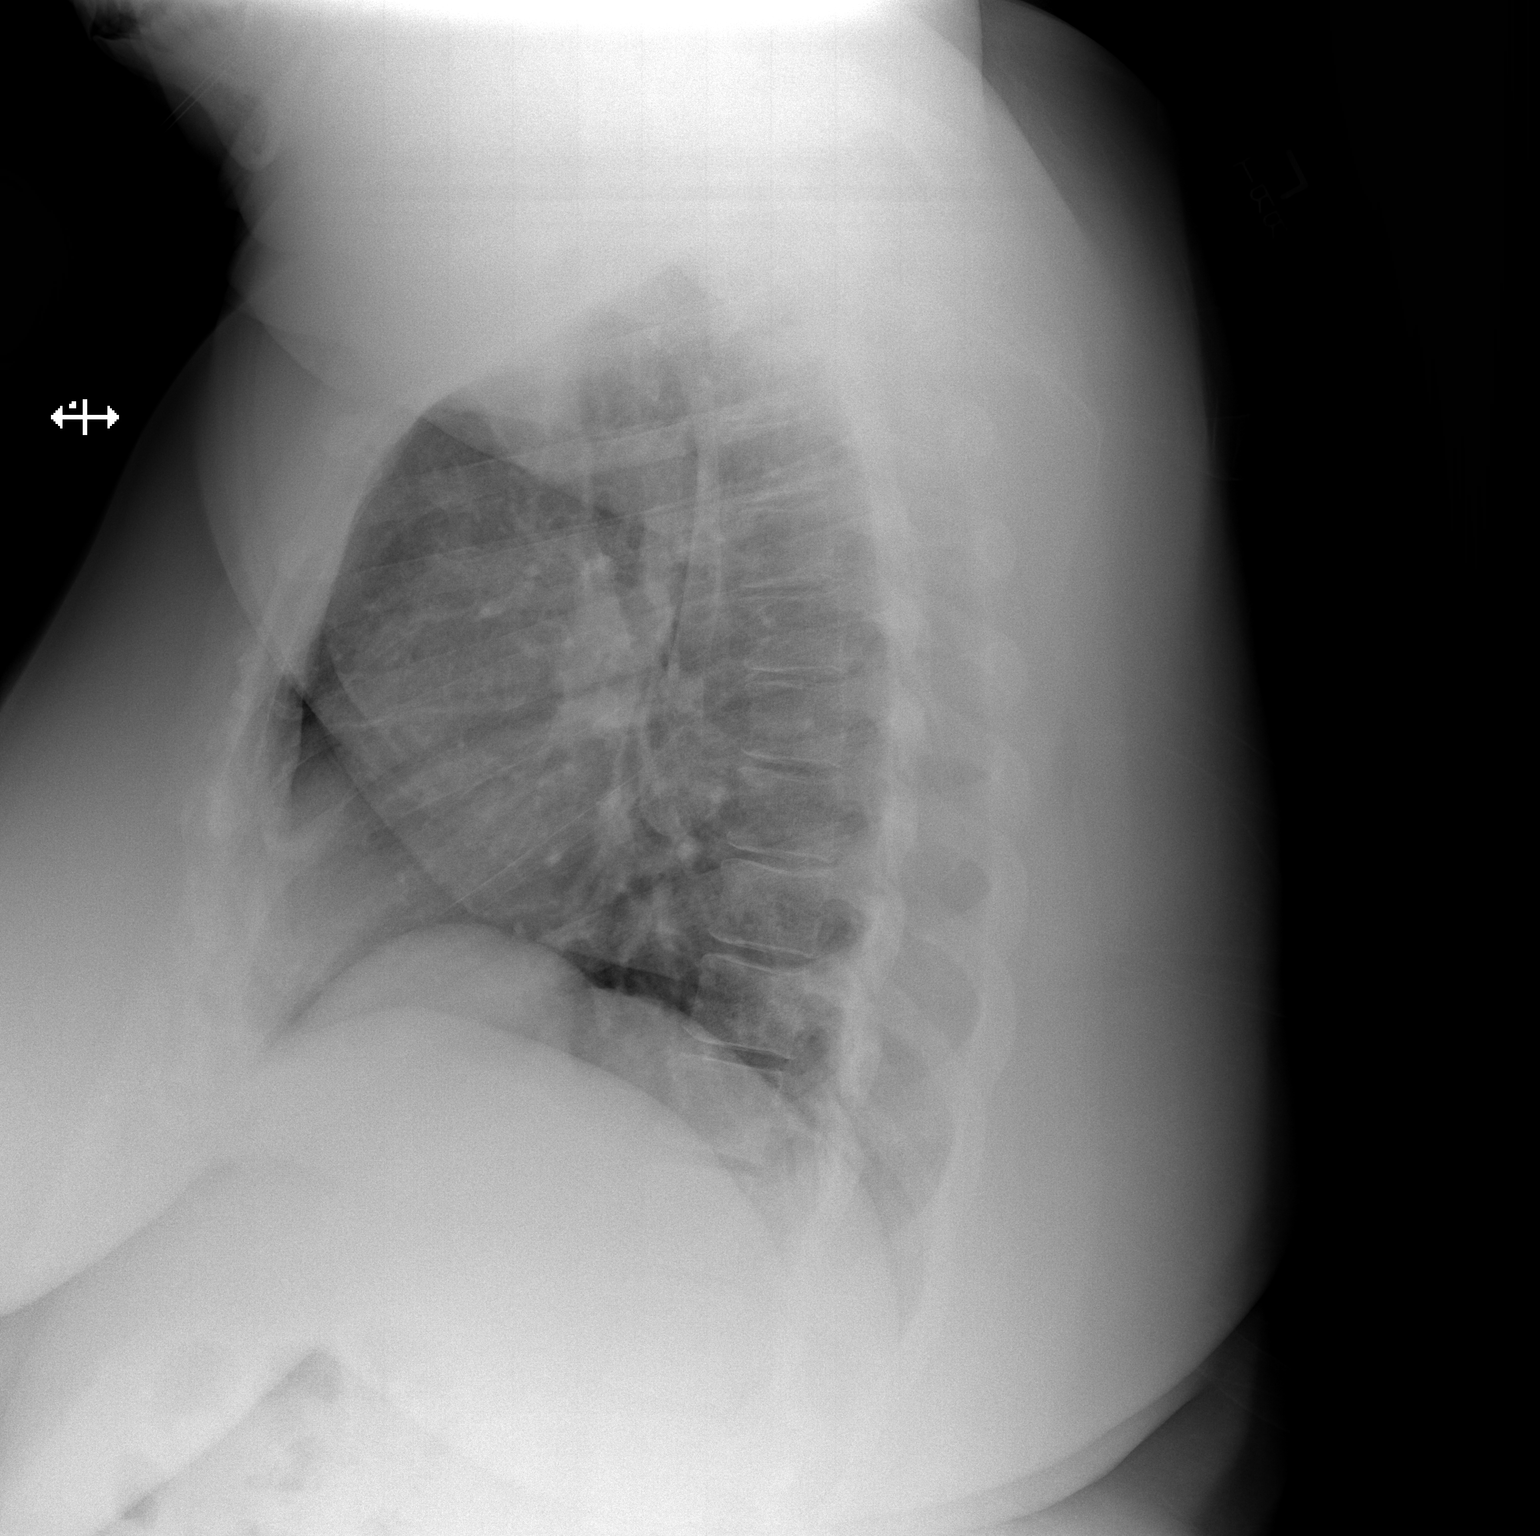

[2 of 2 positions shown; findings below may reference images not displayed]

IMPRESSION: No acute cardiopulmonary disease.

[REDACTED]

## 2013-07-09 ENCOUNTER — Inpatient Hospital Stay: Payer: Self-pay | Admitting: Internal Medicine

## 2013-07-09 LAB — COMPREHENSIVE METABOLIC PANEL
Albumin: 3.2 g/dL — ABNORMAL LOW (ref 3.4–5.0)
Alkaline Phosphatase: 110 U/L (ref 50–136)
BUN: 6 mg/dL — ABNORMAL LOW (ref 7–18)
Calcium, Total: 9.2 mg/dL (ref 8.5–10.1)
Co2: 30 mmol/L (ref 21–32)
EGFR (Non-African Amer.): >60
Glucose: 144 mg/dL — ABNORMAL HIGH (ref 65–99)
Osmolality: 272 (ref 275–301)
SGPT (ALT): 18 U/L (ref 12–78)
Sodium: 136 mmol/L (ref 136–145)
Total Protein: 7.4 g/dL (ref 6.4–8.2)

## 2013-07-09 LAB — GC/CHLAMYDIA PROBE AMP

## 2013-07-09 LAB — URINALYSIS, COMPLETE
Bacteria: NONE SEEN
Bilirubin,UR: NEGATIVE
Glucose,UR: NEGATIVE mg/dL (ref 0–75)
Ketone: NEGATIVE
Protein: 30
Squamous Epithelial: 5

## 2013-07-09 LAB — CBC
HGB: 13.3 g/dL (ref 12.0–16.0)
MCHC: 32.9 g/dL (ref 32.0–36.0)
MCV: 89 fL (ref 80–100)
WBC: 10.8 10*3/uL (ref 3.6–11.0)

## 2013-07-09 LAB — WET PREP, GENITAL

## 2013-07-10 LAB — BASIC METABOLIC PANEL
Anion Gap: 3 — ABNORMAL LOW (ref 7–16)
Co2: 31 mmol/L (ref 21–32)
Creatinine: 0.8 mg/dL (ref 0.60–1.30)
EGFR (African American): >60
EGFR (Non-African Amer.): >60
Glucose: 190 mg/dL — ABNORMAL HIGH (ref 65–99)
Osmolality: 274 (ref 275–301)
Potassium: 3.9 mmol/L (ref 3.5–5.1)
Sodium: 136 mmol/L (ref 136–145)

## 2013-07-10 LAB — CBC WITH DIFFERENTIAL/PLATELET
Eosinophil #: 0.1 10*3/uL (ref 0.0–0.7)
Eosinophil %: 1.7 %
HCT: 35.8 % (ref 35.0–47.0)
Lymphocyte #: 2.2 10*3/uL (ref 1.0–3.6)
MCH: 29.6 pg (ref 26.0–34.0)
MCHC: 33.4 g/dL (ref 32.0–36.0)
Monocyte #: 0.5 x10 3/mm (ref 0.2–0.9)
Monocyte %: 6.2 %
Neutrophil %: 64.1 %
Platelet: 262 10*3/uL (ref 150–440)
RBC: 4.05 10*6/uL (ref 3.80–5.20)
WBC: 8.2 10*3/uL (ref 3.6–11.0)

## 2013-07-12 ENCOUNTER — Emergency Department: Payer: Self-pay | Admitting: Emergency Medicine

## 2013-07-12 LAB — COMPREHENSIVE METABOLIC PANEL
Anion Gap: 4 — ABNORMAL LOW (ref 7–16)
Chloride: 104 mmol/L (ref 98–107)
Glucose: 168 mg/dL — ABNORMAL HIGH (ref 65–99)
Osmolality: 278 (ref 275–301)
Potassium: 4.2 mmol/L (ref 3.5–5.1)
SGOT(AST): 22 U/L (ref 15–37)
SGPT (ALT): 19 U/L (ref 12–78)
Total Protein: 7.1 g/dL (ref 6.4–8.2)

## 2013-07-12 LAB — CBC
MCH: 29.7 pg (ref 26.0–34.0)
MCHC: 33.6 g/dL (ref 32.0–36.0)
MCV: 89 fL (ref 80–100)
Platelet: 296 10*3/uL (ref 150–440)
WBC: 7.7 10*3/uL (ref 3.6–11.0)

## 2013-07-25 IMAGING — CR DG CHEST 2V
1 series · 2 of 2 positions shown · non-contrast
Comparison: none

REASON FOR EXAM: sob asthma copd
COMMENTS:

[Series 1: pa · 0.17mm/px · 2 of 2 slices shown]
[im 1/2]
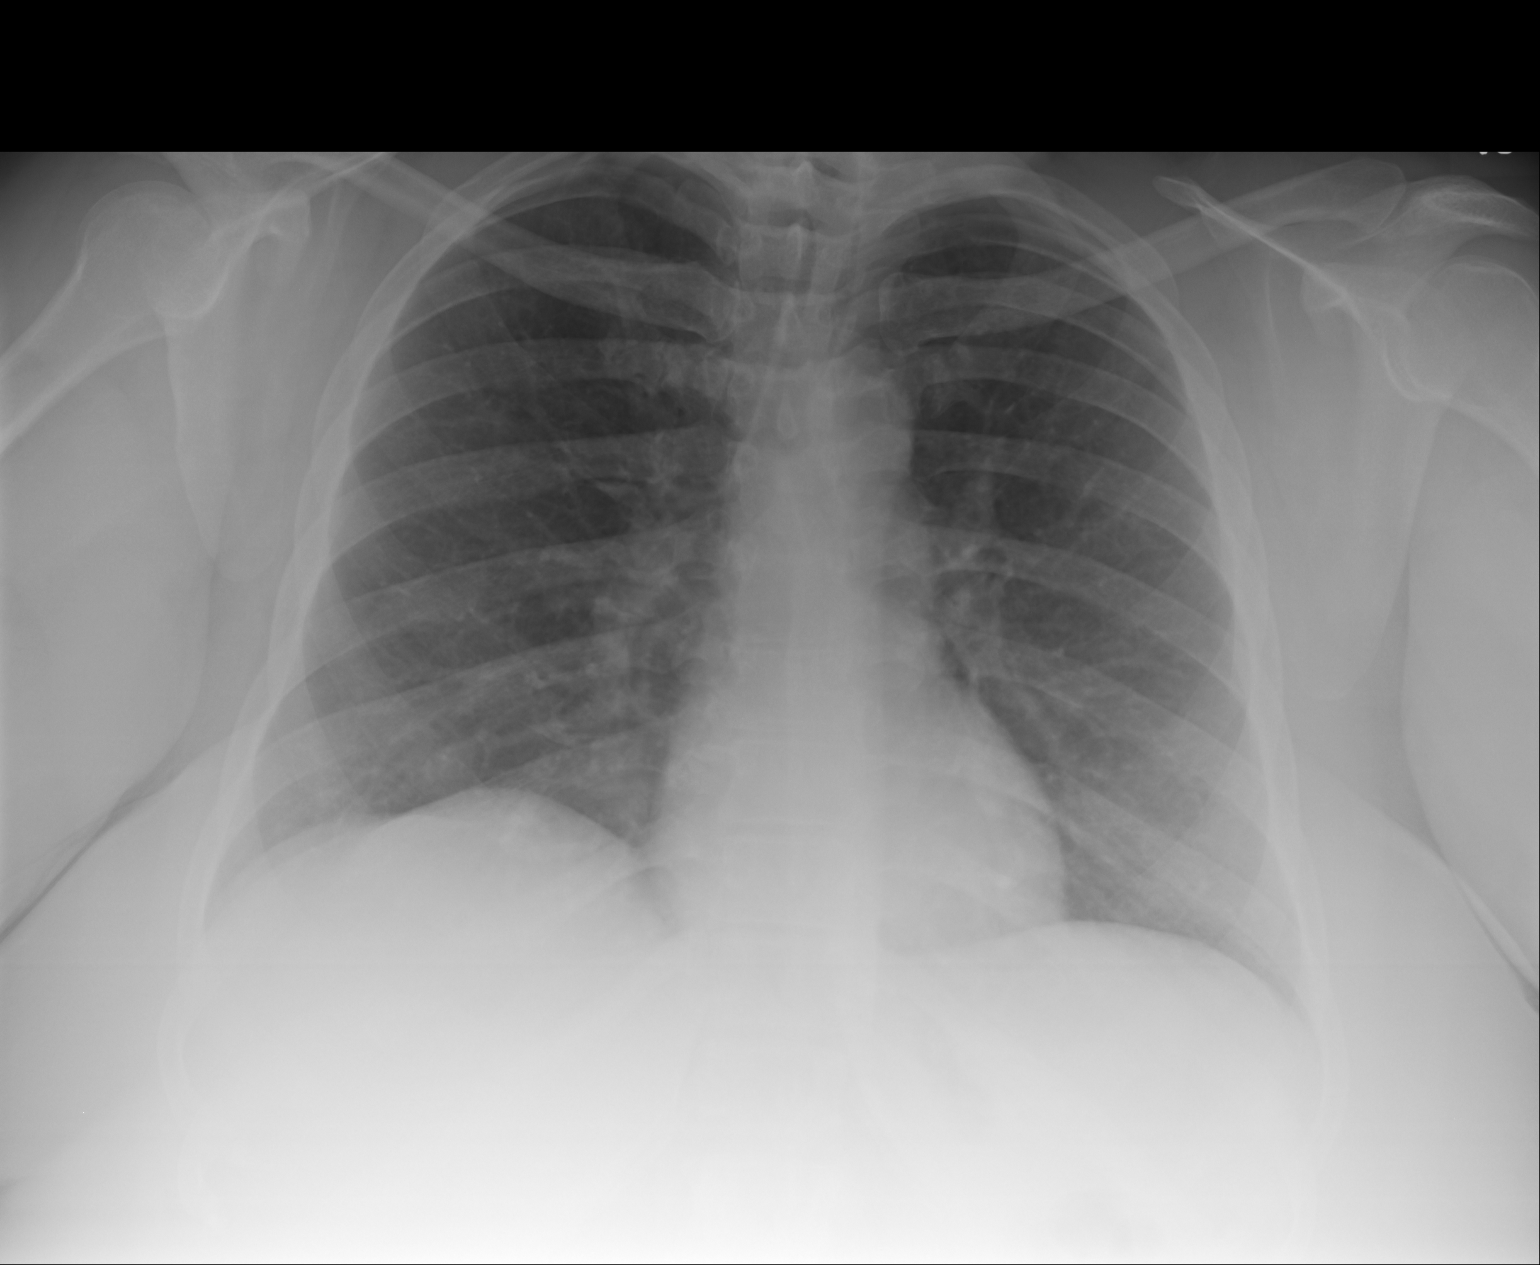
[im 2/2]
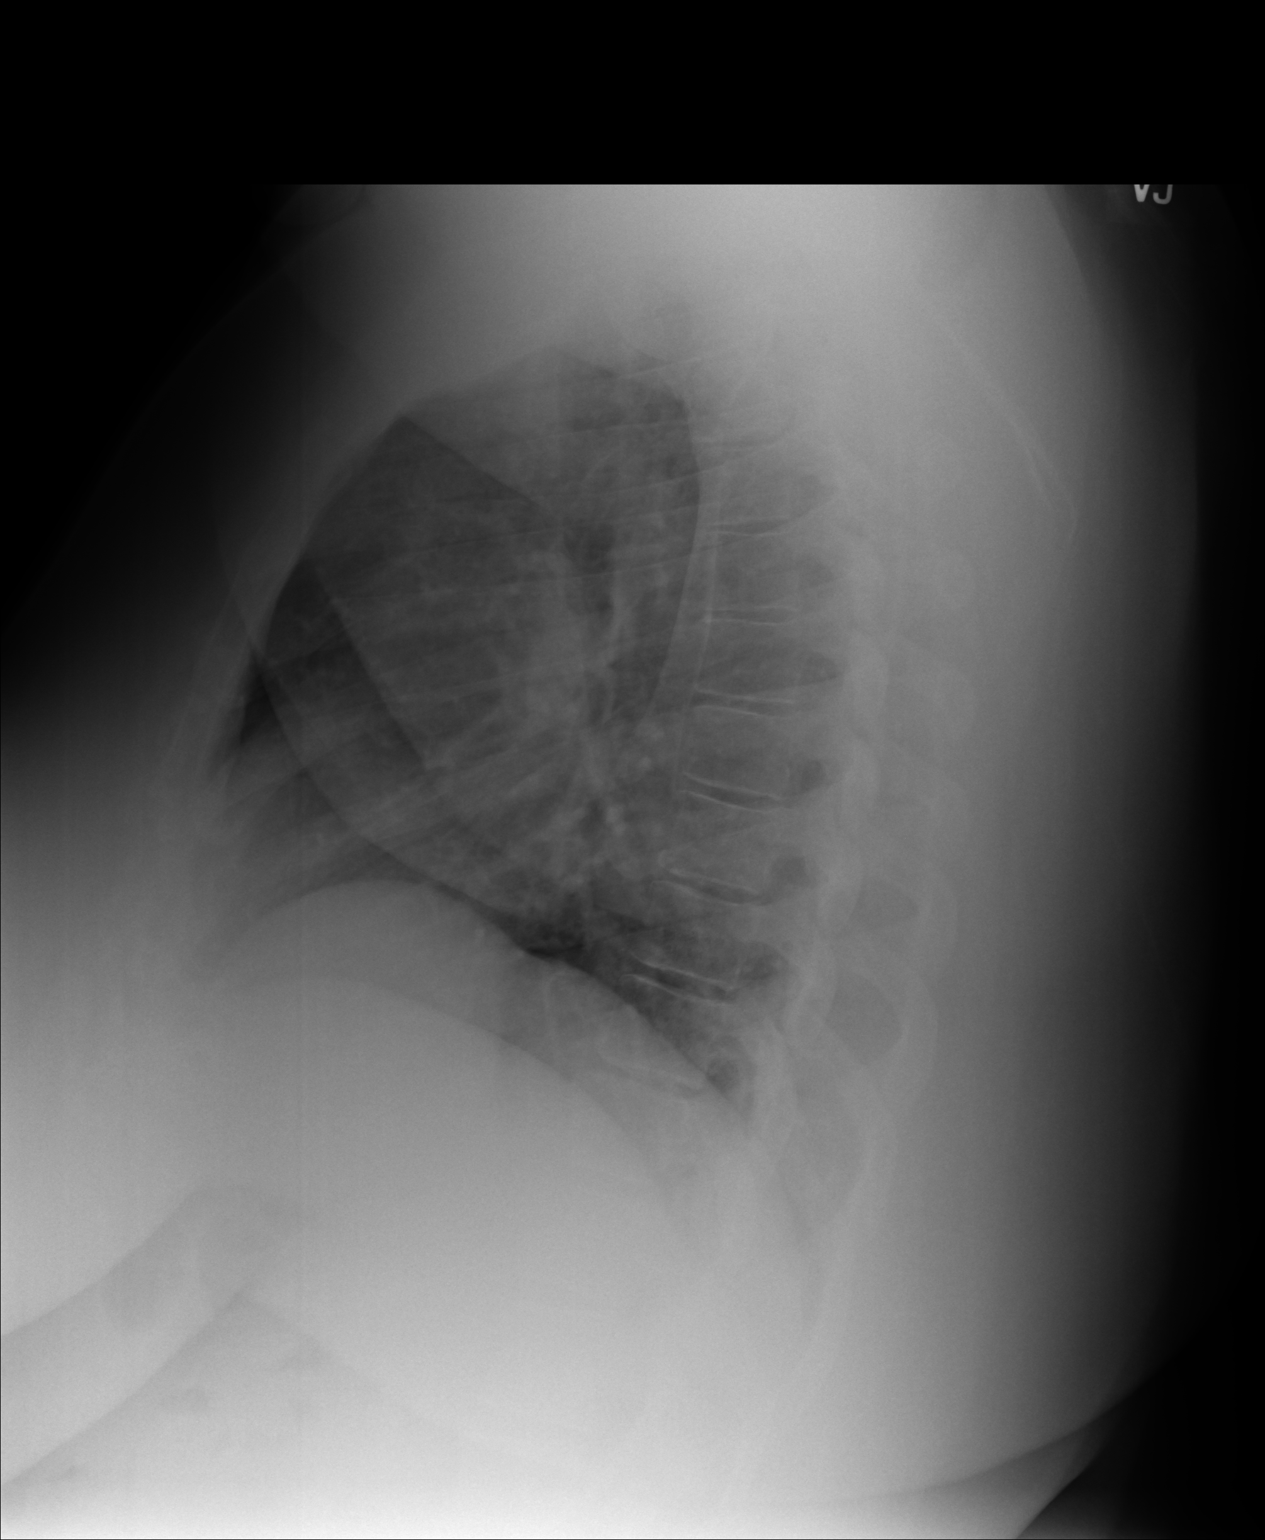

[2 of 2 positions shown; findings below may reference images not displayed]

PROCEDURE:     KDR - KDXR CHEST PA (OR AP) AND LAT  - October 12, 2012  [DATE]

RESULT:     Comparison is made to the previous exam dated 20 September, 2012.

The lungs are clear. The heart and pulmonary vessels are normal. The bony
and mediastinal structures are unremarkable. There is no effusion. There is
no pneumothorax or evidence of congestive failure.
IMPRESSION: No acute cardiopulmonary disease.

[REDACTED]

## 2013-07-31 ENCOUNTER — Other Ambulatory Visit: Payer: Self-pay | Admitting: Orthopedic Surgery

## 2013-07-31 LAB — SYNOVIAL CELL COUNT + DIFF, W/ CRYSTALS
Basophil: 0 %
Crystals, Joint Fluid: NONE SEEN
Eosinophil: 0 %
Lymphocytes: 26 %
Neutrophils: 72 %
Nucleated Cell Count: 1294 /mm3
Other Cells BF: 0 %
Other Mononuclear Cells: 2 %

## 2013-08-01 ENCOUNTER — Ambulatory Visit: Payer: Self-pay | Admitting: Orthopedic Surgery

## 2013-08-04 LAB — BODY FLUID CULTURE

## 2013-08-07 ENCOUNTER — Ambulatory Visit: Payer: Self-pay | Admitting: Orthopedic Surgery

## 2013-08-29 DIAGNOSIS — Z87891 Personal history of nicotine dependence: Secondary | ICD-10-CM | POA: Insufficient documentation

## 2013-08-29 DIAGNOSIS — Z720 Tobacco use: Secondary | ICD-10-CM | POA: Insufficient documentation

## 2013-08-29 DIAGNOSIS — J45909 Unspecified asthma, uncomplicated: Secondary | ICD-10-CM | POA: Insufficient documentation

## 2013-08-29 DIAGNOSIS — Z96659 Presence of unspecified artificial knee joint: Secondary | ICD-10-CM | POA: Insufficient documentation

## 2013-08-29 DIAGNOSIS — D649 Anemia, unspecified: Secondary | ICD-10-CM | POA: Insufficient documentation

## 2013-08-29 DIAGNOSIS — N946 Dysmenorrhea, unspecified: Secondary | ICD-10-CM | POA: Insufficient documentation

## 2013-08-29 DIAGNOSIS — G4700 Insomnia, unspecified: Secondary | ICD-10-CM | POA: Insufficient documentation

## 2013-10-01 DIAGNOSIS — K589 Irritable bowel syndrome without diarrhea: Secondary | ICD-10-CM | POA: Insufficient documentation

## 2013-10-01 DIAGNOSIS — Z85828 Personal history of other malignant neoplasm of skin: Secondary | ICD-10-CM | POA: Insufficient documentation

## 2013-10-01 DIAGNOSIS — E669 Obesity, unspecified: Secondary | ICD-10-CM

## 2013-10-01 DIAGNOSIS — Z8669 Personal history of other diseases of the nervous system and sense organs: Secondary | ICD-10-CM | POA: Insufficient documentation

## 2013-10-01 DIAGNOSIS — K5792 Diverticulitis of intestine, part unspecified, without perforation or abscess without bleeding: Secondary | ICD-10-CM | POA: Insufficient documentation

## 2013-10-01 DIAGNOSIS — N921 Excessive and frequent menstruation with irregular cycle: Secondary | ICD-10-CM | POA: Insufficient documentation

## 2013-10-01 DIAGNOSIS — K760 Fatty (change of) liver, not elsewhere classified: Secondary | ICD-10-CM | POA: Insufficient documentation

## 2013-10-03 DIAGNOSIS — N939 Abnormal uterine and vaginal bleeding, unspecified: Secondary | ICD-10-CM | POA: Insufficient documentation

## 2013-10-03 DIAGNOSIS — Z6841 Body Mass Index (BMI) 40.0 and over, adult: Secondary | ICD-10-CM | POA: Insufficient documentation

## 2013-10-05 DIAGNOSIS — T8484XA Pain due to internal orthopedic prosthetic devices, implants and grafts, initial encounter: Secondary | ICD-10-CM | POA: Insufficient documentation

## 2013-10-05 DIAGNOSIS — Z96659 Presence of unspecified artificial knee joint: Secondary | ICD-10-CM

## 2013-10-08 IMAGING — CR DG KNEE 1-2V*R*
1 series · 3 of 3 positions shown · non-contrast
Comparison: none

REASON FOR EXAM: post op
COMMENTS:   Bedside (portable):Y

[Series 1: ap · 0.17mm/px · 3 of 3 slices shown]
[im 1/3]
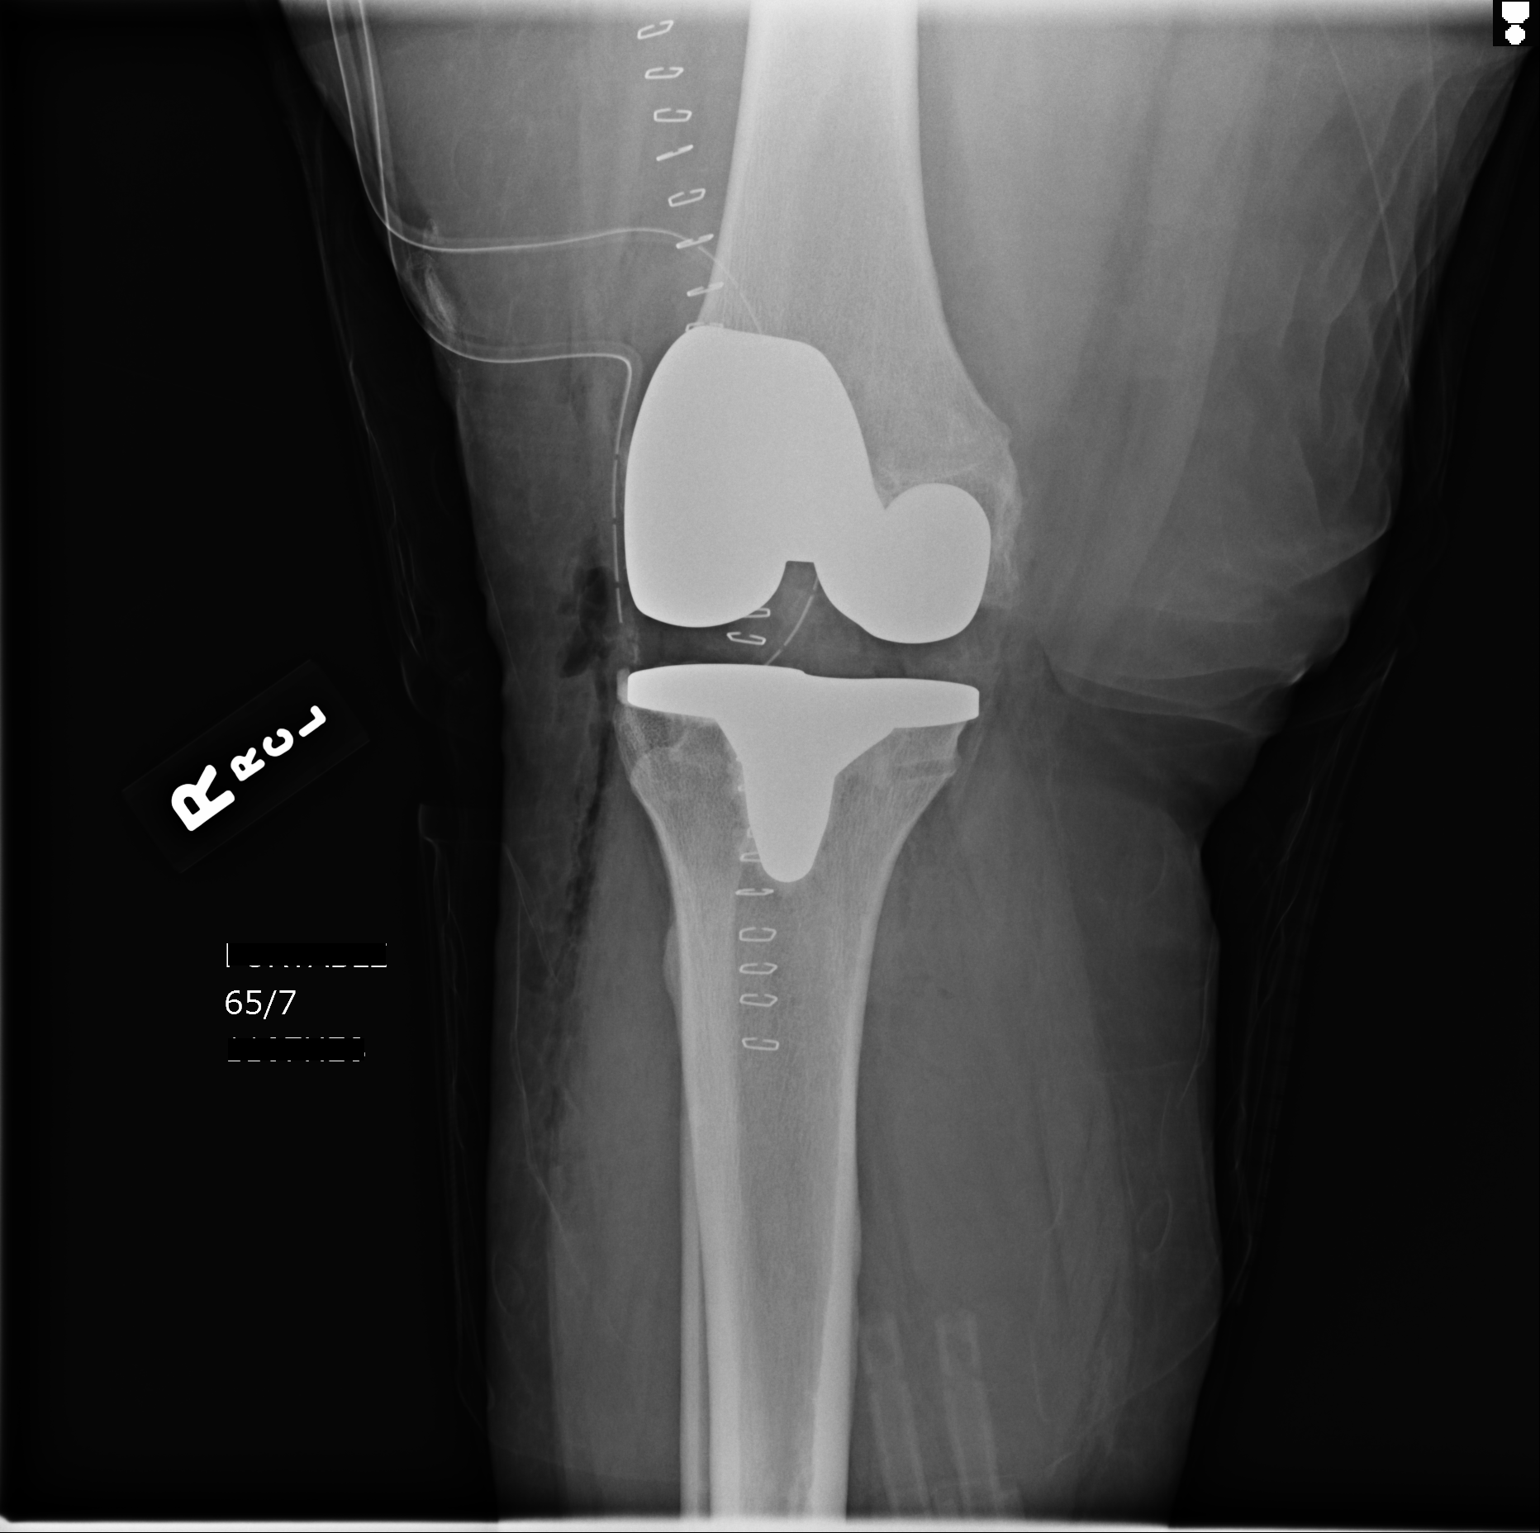
[im 2/3]
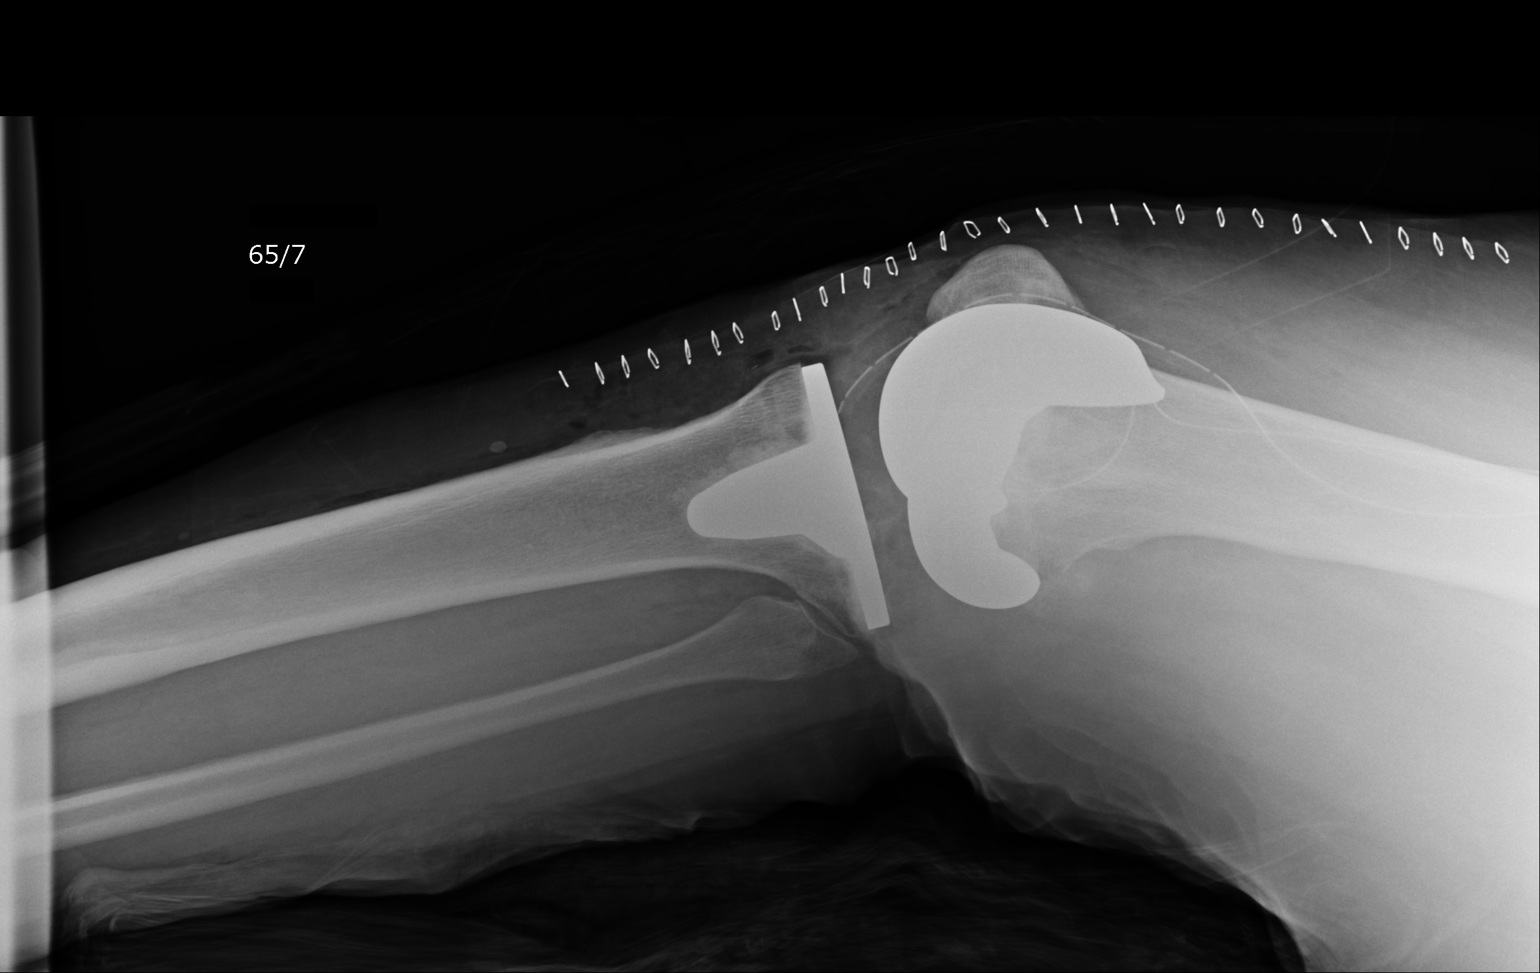
[im 3/3]
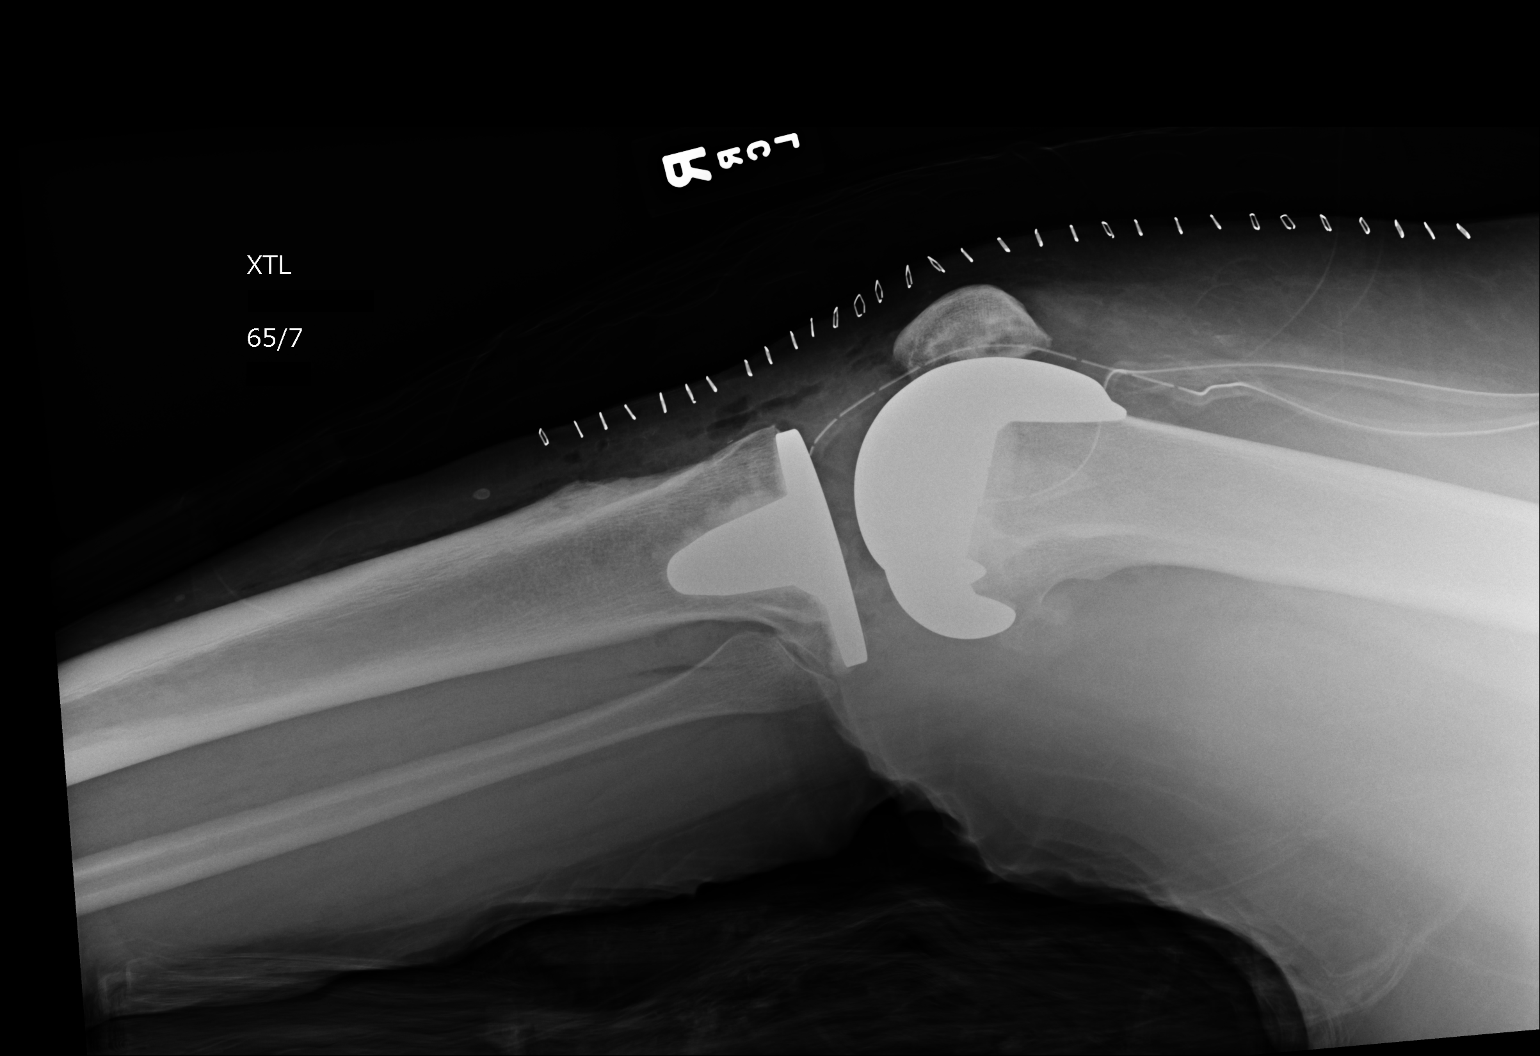

[3 of 3 positions shown; findings below may reference images not displayed]

PROCEDURE:     DXR - DXR KNEE RIGHT AP AND LATERAL  - December 26, 2012 [DATE]

RESULT:     AP and lateral views of the right knee reveal the patient to
have undergone knee joint prosthesis placement. Radiographic positioning of
the prosthetic components is good. Surgical drain lines and skin staples are
present.
IMPRESSION: The patient has undergone knee joint prosthesis placement
on the right. Further interpretation is deferred to Dr. Sntna Rsro.

[REDACTED]

## 2013-10-09 DIAGNOSIS — D251 Intramural leiomyoma of uterus: Secondary | ICD-10-CM | POA: Insufficient documentation

## 2013-11-01 DIAGNOSIS — N840 Polyp of corpus uteri: Secondary | ICD-10-CM | POA: Insufficient documentation
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
## 2014-02-11 IMAGING — US US PELV - US TRANSVAGINAL
1 series · 14 of 25 positions shown · non-contrast
Comparison: none

REASON FOR EXAM: MENORRHAGIA, PELVIC PAIN
COMMENTS:   May transport without cardiac monitor

PROCEDURE:     US  - US PELVIS EXAM W/TRANSVAGINAL  - May 01, 2013  [DATE]
RESULT:     History: Pelvic pain and menorrhagia.
Comparison Study: No prior.

[Series 1: us pelv - us transvaginal · 0.28mm/px · 14 of 92 slices shown]
[im 1/92]
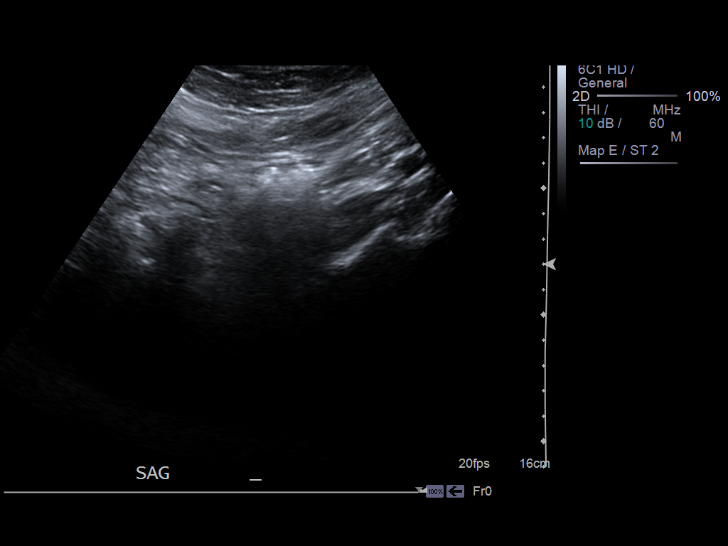
[im 8/92]
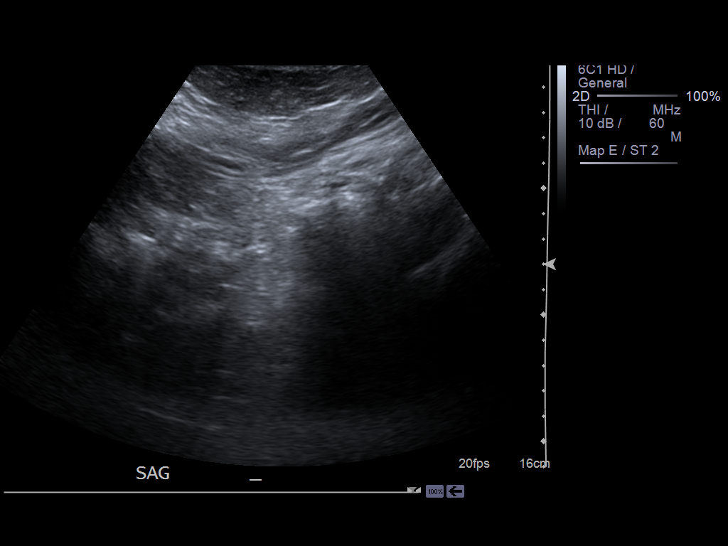
[im 16/92]
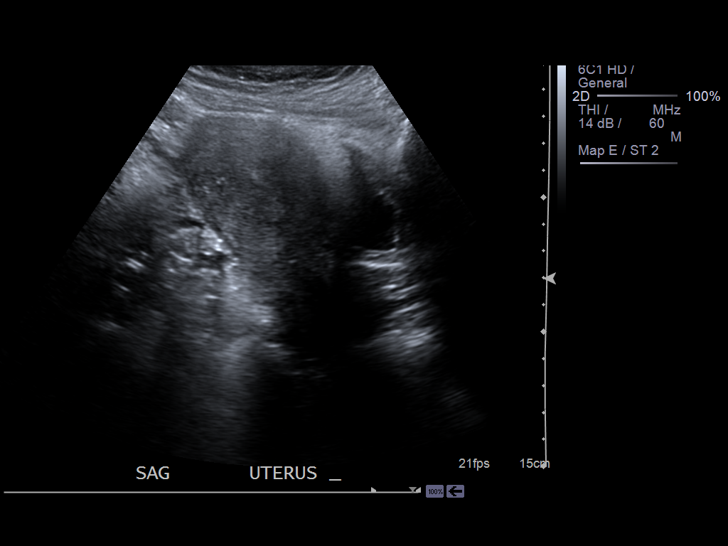
[im 23/92]
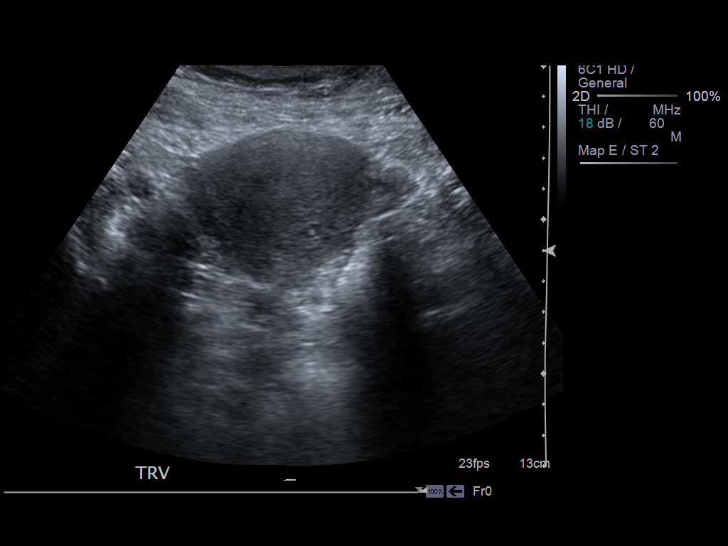
[im 31/92]
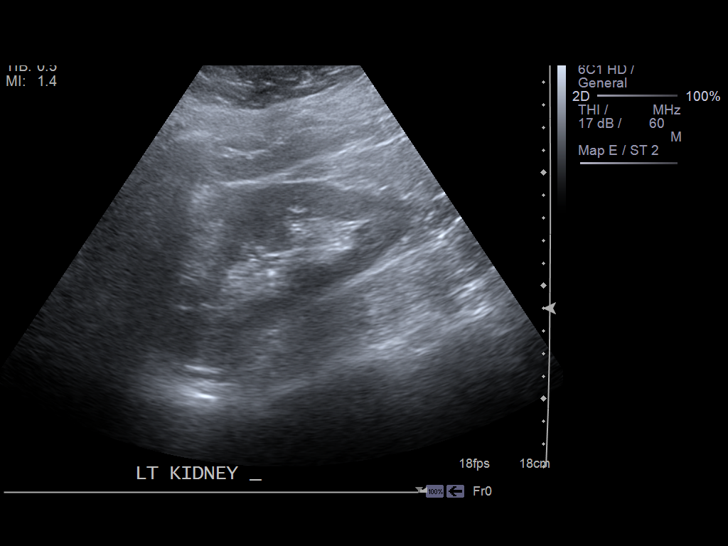
[im 35/92]
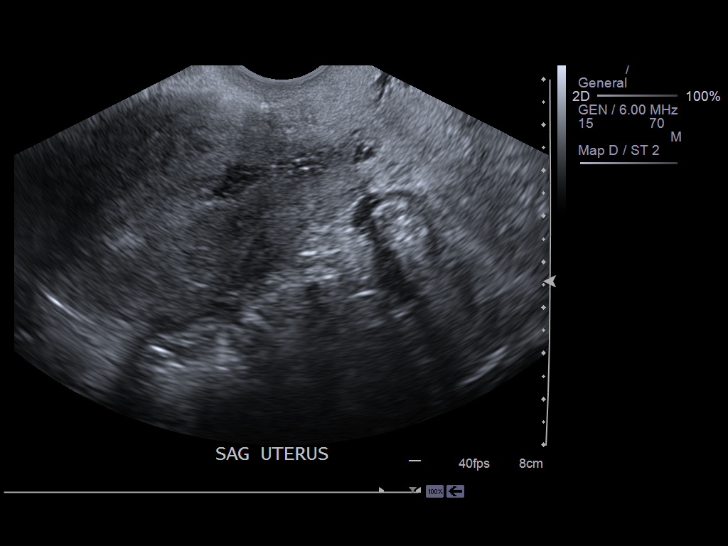
[im 42/92]
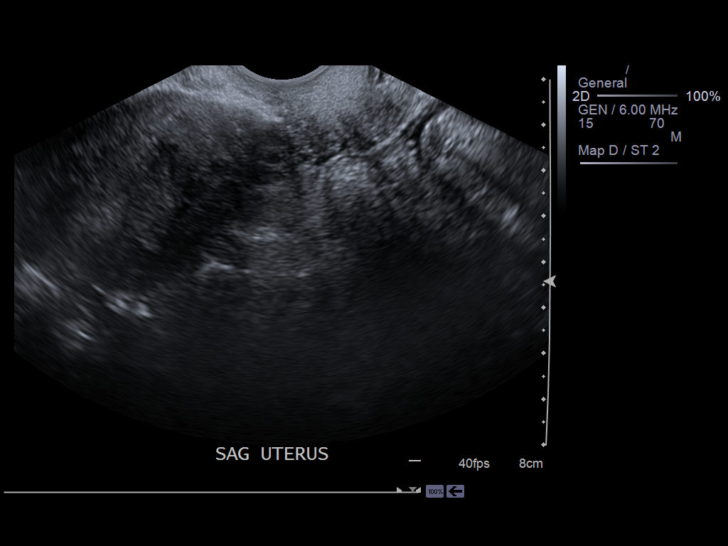
[im 50/92]
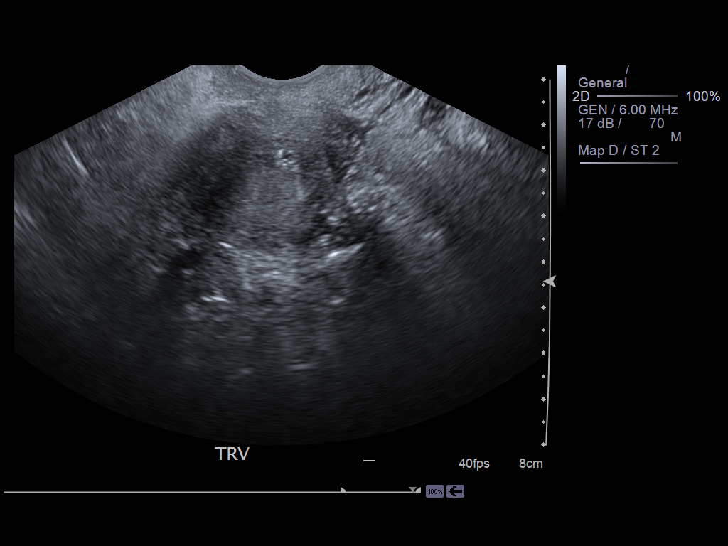
[im 57/92]
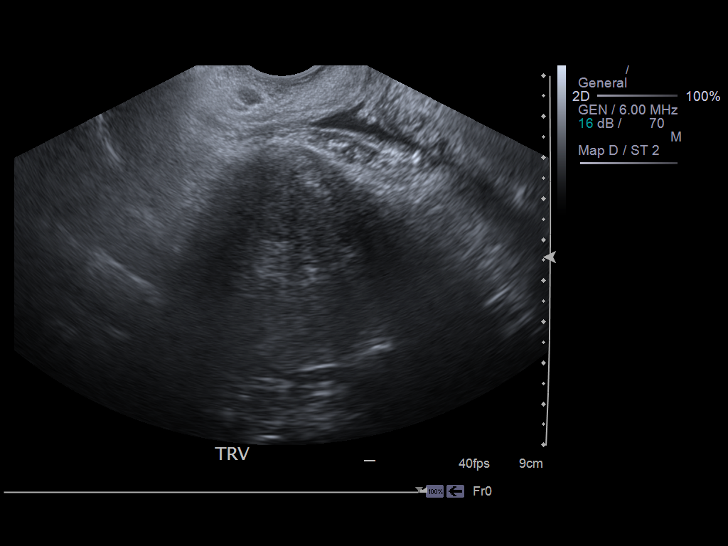
[im 61/92]
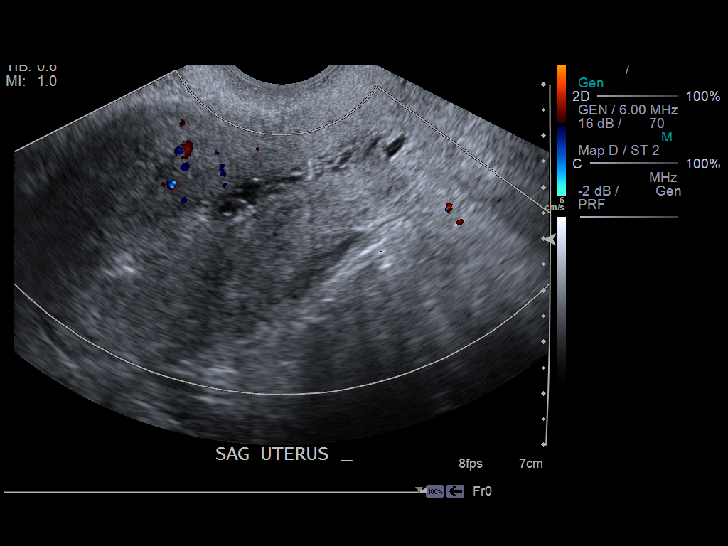
[im 69/92]
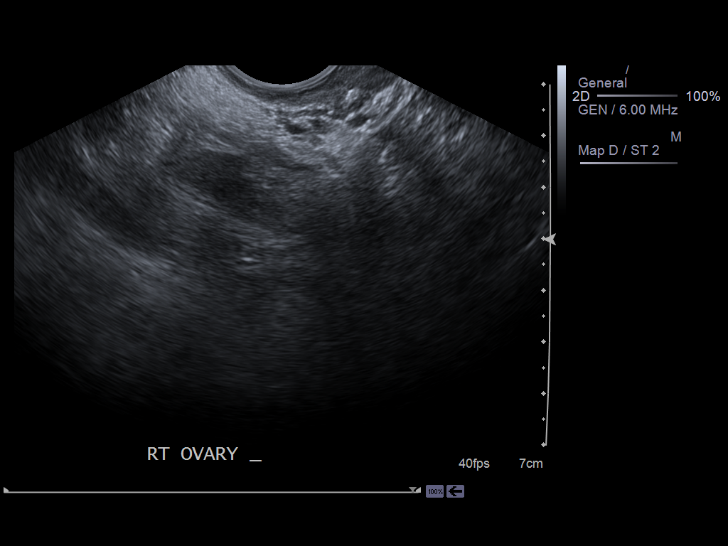
[im 76/92]
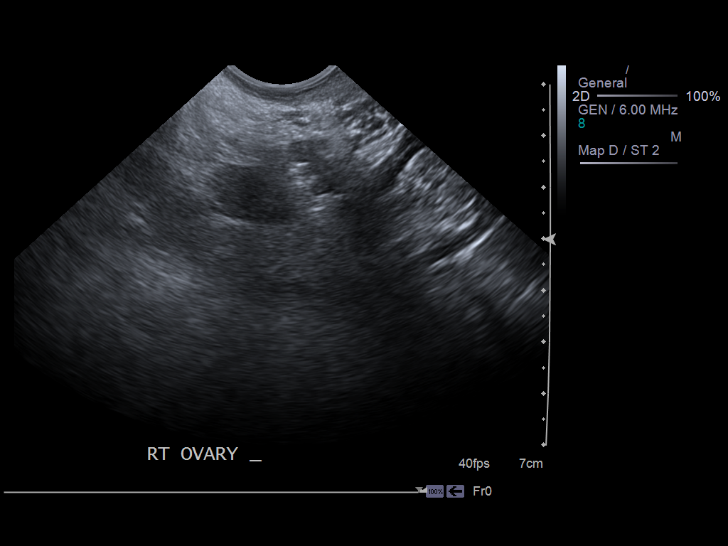
[im 84/92]
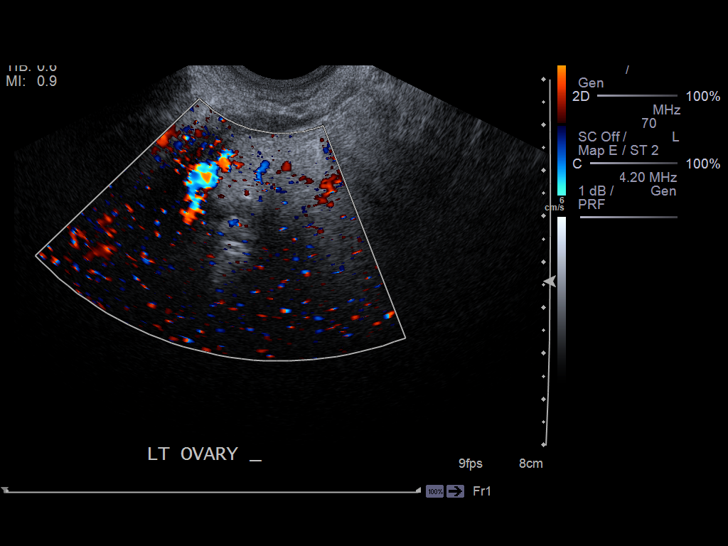
[im 92/92]
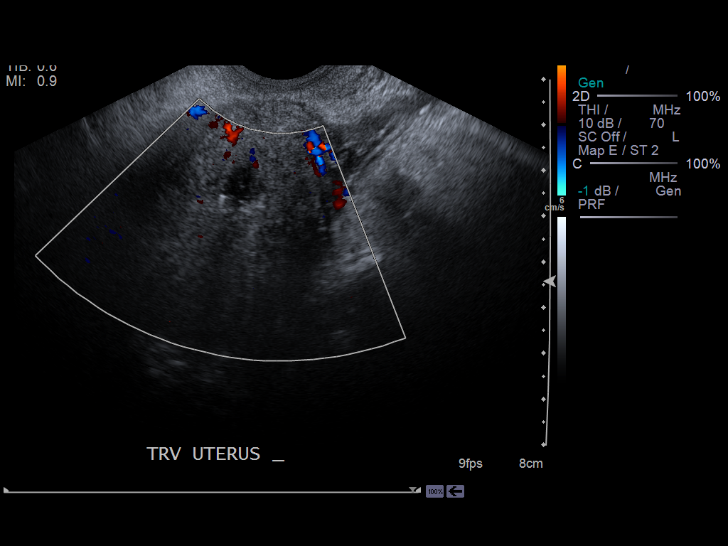

[14 of 25 positions shown; findings below may reference images not displayed]

FINDINGS: Uterus measures 10 x 5.9 x 4.3 centimeters. Endometrial thickness
is 5.3 mm. Noted in the lower uterine segment within the endometrial canal
is hypoechogenic region  no blood flow. This could represent a blood clot.
Endometrial  lesion including tumor or infection cannot be excluded. Product
of conception cannot be excluded. Early intrauterine pregnancy cannot be
entirely excluded. Please correlate with pregnancy test. Followup ultrasound
can be obtained. Ovaries are difficult to visualize. Patient is very obese.
Right ovary measures 1.9 x 1.2 x 1.0 cm with normal blood flow. Left ovary
measures 1.8 x 1.2 x 1.4 cm with normal blood flow.
IMPRESSION: Hypoechogenic region in the lower uterine segment. This
could represent a blood clot. This was a difficult study due to the
patient's size. Correlation with pregnancy test and followup pelvic
ultrasound may prove useful.

## 2014-03-07 ENCOUNTER — Emergency Department: Payer: Self-pay | Admitting: Emergency Medicine

## 2014-03-07 LAB — URINALYSIS, COMPLETE
BACTERIA: NONE SEEN
BILIRUBIN, UR: NEGATIVE
GLUCOSE, UR: NEGATIVE mg/dL (ref 0–75)
Ketone: NEGATIVE
LEUKOCYTE ESTERASE: NEGATIVE
NITRITE: NEGATIVE
Ph: 6 (ref 4.5–8.0)
Protein: NEGATIVE
RBC,UR: 1 /HPF (ref 0–5)
Specific Gravity: 1.012 (ref 1.003–1.030)
Squamous Epithelial: 2
WBC UR: NONE SEEN /HPF (ref 0–5)

## 2014-03-07 LAB — RAPID INFLUENZA A&B ANTIGENS (ARMC ONLY)

## 2014-03-07 LAB — PREGNANCY, URINE: Pregnancy Test, Urine: NEGATIVE m[IU]/mL

## 2014-04-06 ENCOUNTER — Emergency Department: Payer: Self-pay | Admitting: Emergency Medicine

## 2014-04-09 ENCOUNTER — Emergency Department: Payer: Self-pay | Admitting: Internal Medicine

## 2014-04-13 LAB — WOUND CULTURE

## 2014-04-21 IMAGING — CT CT ABD-PELV W/ CM
1 of 2 series · 15 of 32 positions shown, 19 images · non-contrast
Comparison: none

REASON FOR EXAM: (1) generalized abdominal pain nausea; (2) left pelvic
pain
COMMENTS:

[Series 2: 3mm soft tissue · axial · 0.81mm/px · z∈[-736,-282]mm · 15 of 165 slices shown, 19 images]
[im 7/165  soft-tissue]
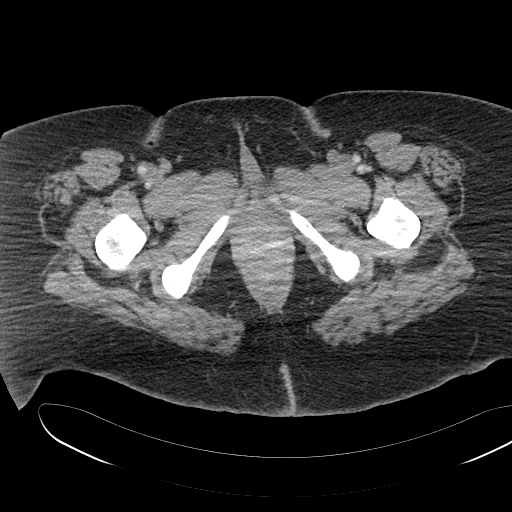
[im 7/165  bone]
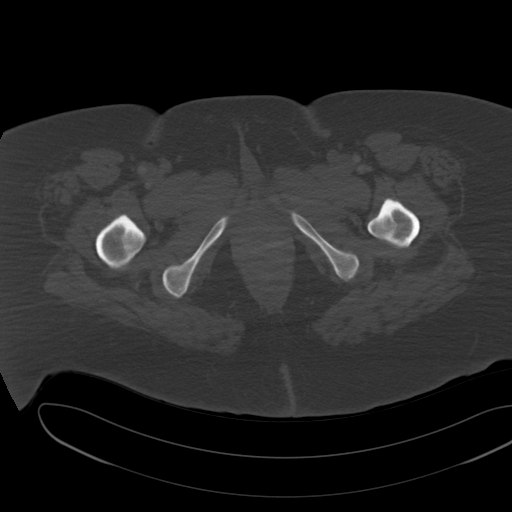
[im 21/165  soft-tissue]
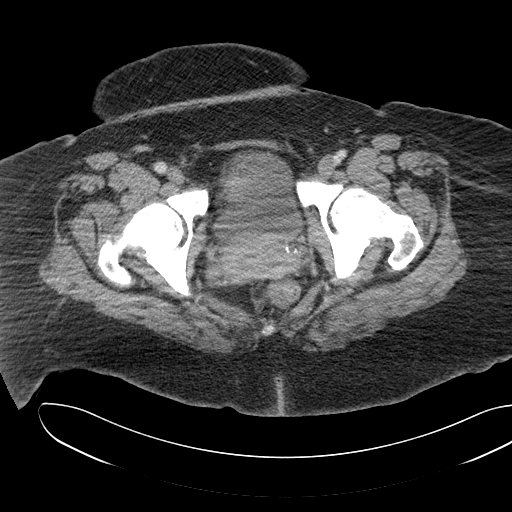
[im 35/165  soft-tissue]
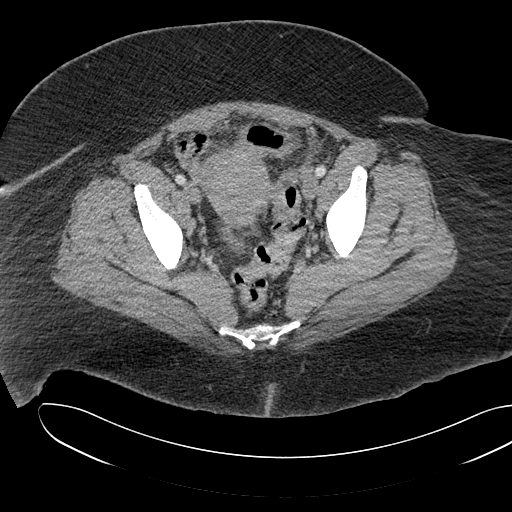
[im 48/165  soft-tissue]
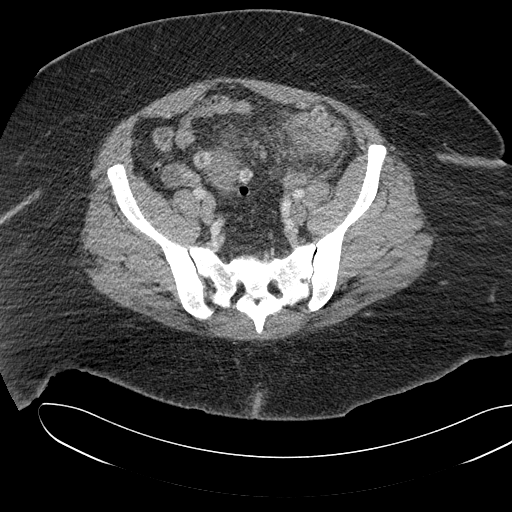
[im 55/165  soft-tissue]
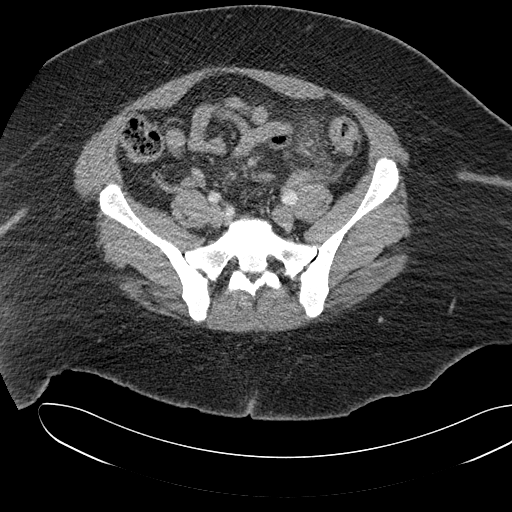
[im 69/165  soft-tissue]
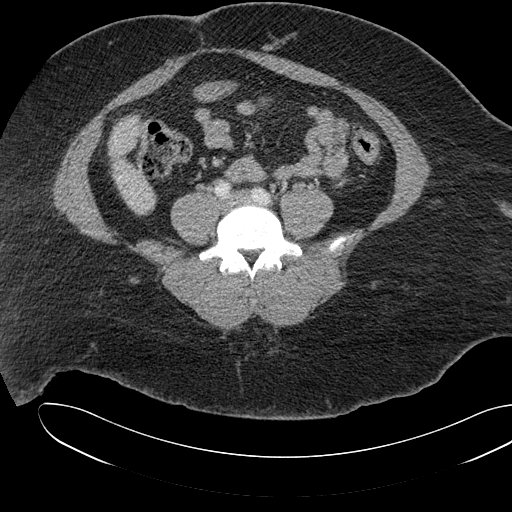
[im 83/165  soft-tissue]
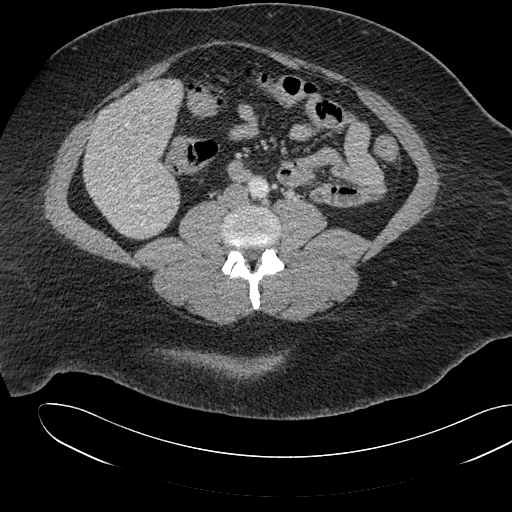
[im 96/165  soft-tissue]
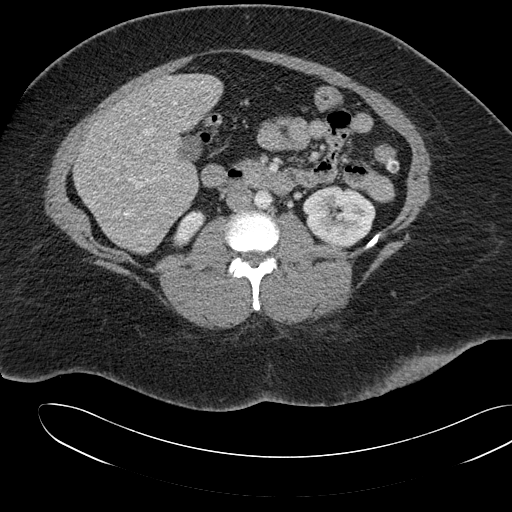
[im 110/165  soft-tissue]
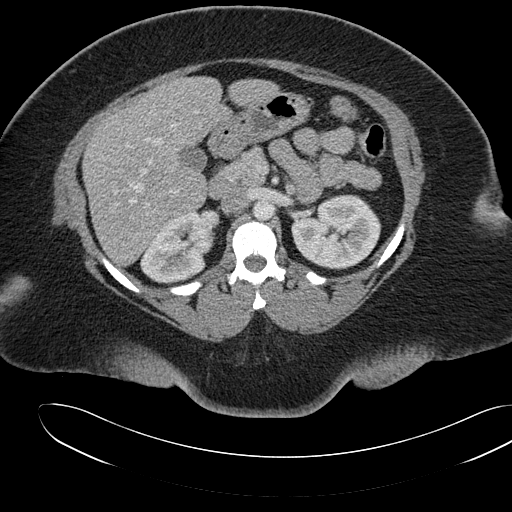
[im 110/165  bone]
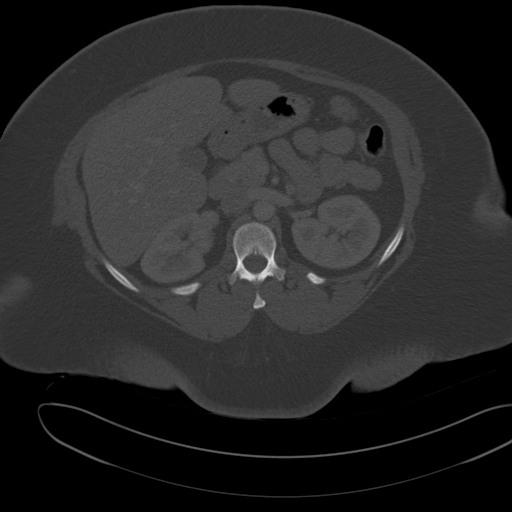
[im 117/165  soft-tissue]
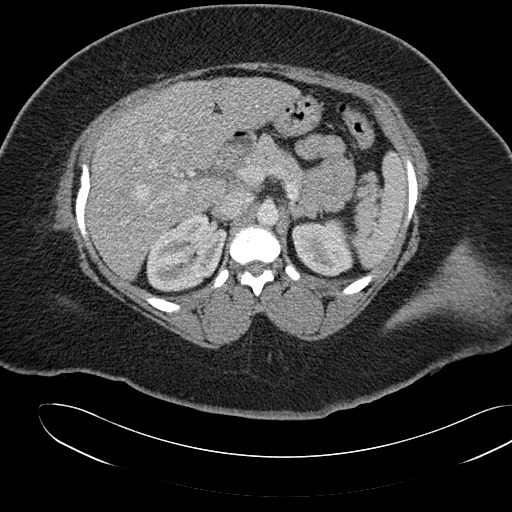
[im 130/165  soft-tissue]
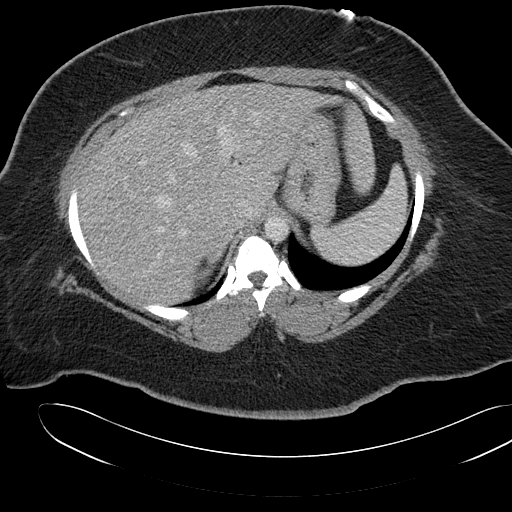
[im 137/165  lung]
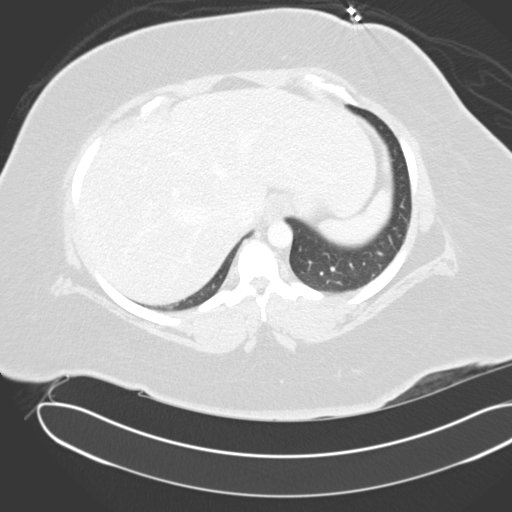
[im 144/165  soft-tissue]
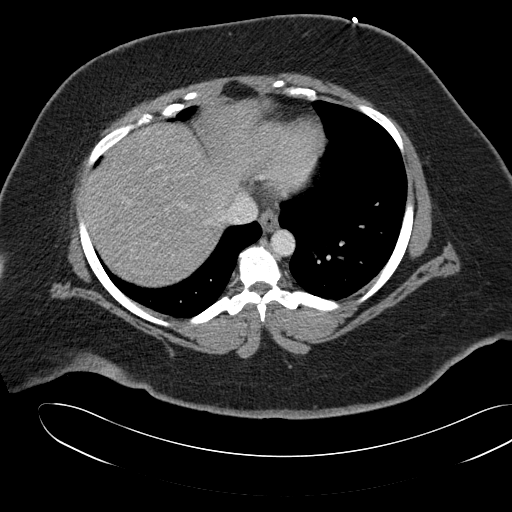
[im 144/165  lung]
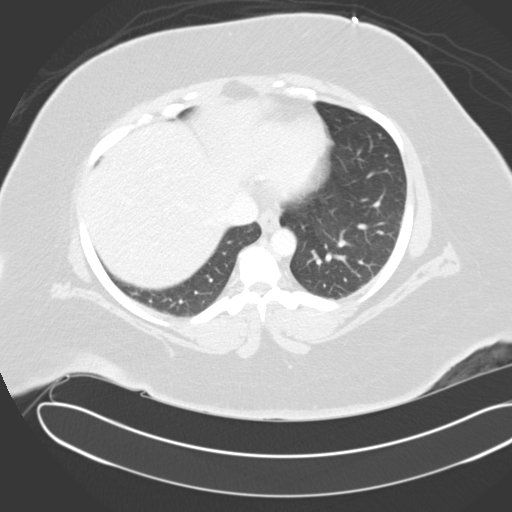
[im 151/165  lung]
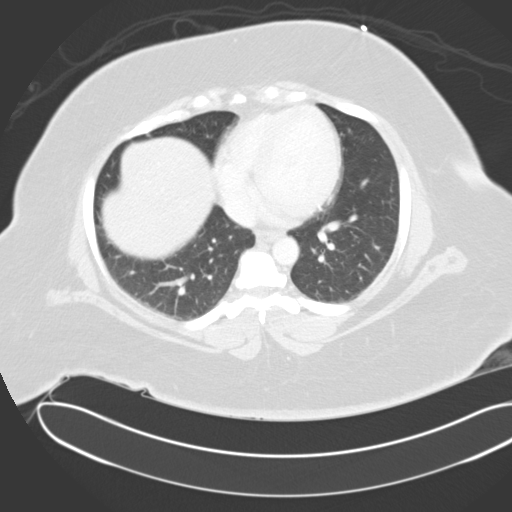
[im 158/165  soft-tissue]
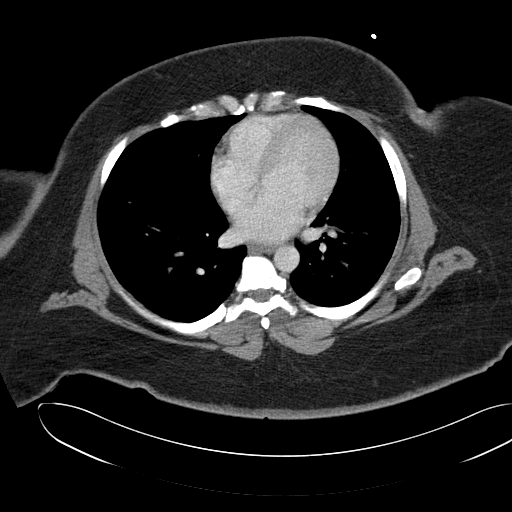
[im 158/165  lung]
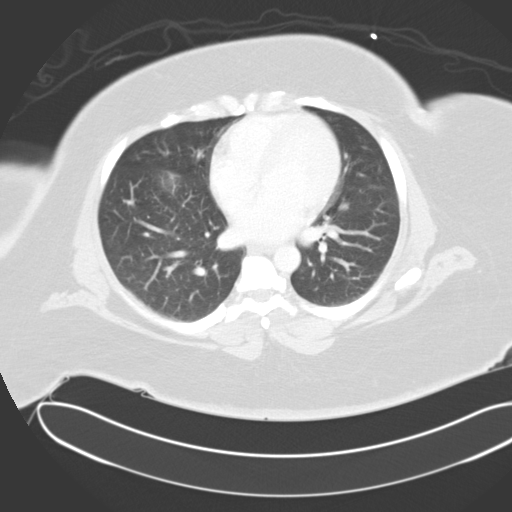

[15 of 32 positions shown; findings below may reference images not displayed]

PROCEDURE:     CT  - CT ABDOMEN / PELVIS  W  - July 09, 2013 [DATE]

RESULT:     CT of the abdomen and pelvis is performed utilizing 100 mL of
Hsovue-BIX iodinated intravenous contrast without oral contrast. Images are
reconstructed at 3 mm slice thickness in the axial plane and compared
previous study of 01/18/2011.

There is abnormal appearance of the sigmoid colon and distal descending
colon with pericolonic inflammatory stranding and multiple diverticula
present most consistent with acute diverticulitis. Colitis is felt to be
less likely the differential consideration. There is no evidence of
perforation. There is no abscess formation. The appendix appears normal. The
uterus is present and appears unremarkable. There is a small amount of urine
in the bladder. The liver shows fatty infiltration. The spleen, kidneys,
gallbladder, adrenal glands, pancreas and aorta appear to be unremarkable.
Stomach and small bowel appear unremarkable. The remaining portions of the
colon appear to be within normal limits. The bony structures appear
unremarkable. The lung bases are clear. The heart is normal in size.
IMPRESSION: 1. Diverticulosis with findings of acute sigmoid diverticulitis without
abscess formation or perforation. There may be some involvement in the
distal descending colon as well. Colitis is not excluded from the list of
differential considerations. The lung bases are clear.

[REDACTED]

## 2014-05-04 ENCOUNTER — Emergency Department: Payer: Self-pay | Admitting: Emergency Medicine

## 2014-05-04 LAB — HEPATIC FUNCTION PANEL A (ARMC)
ALT: 27 U/L (ref 12–78)
Albumin: 3.5 g/dL (ref 3.4–5.0)
Alkaline Phosphatase: 93 U/L
BILIRUBIN TOTAL: 0.3 mg/dL (ref 0.2–1.0)
SGOT(AST): 16 U/L (ref 15–37)
TOTAL PROTEIN: 7.6 g/dL (ref 6.4–8.2)

## 2014-05-04 LAB — CBC
HCT: 43.8 % (ref 35.0–47.0)
HGB: 14.2 g/dL (ref 12.0–16.0)
MCH: 30 pg (ref 26.0–34.0)
MCHC: 32.4 g/dL (ref 32.0–36.0)
MCV: 93 fL (ref 80–100)
PLATELETS: 262 10*3/uL (ref 150–440)
RBC: 4.73 10*6/uL (ref 3.80–5.20)
RDW: 15.2 % — ABNORMAL HIGH (ref 11.5–14.5)
WBC: 8.4 10*3/uL (ref 3.6–11.0)

## 2014-05-04 LAB — BASIC METABOLIC PANEL
Anion Gap: 7 (ref 7–16)
BUN: 9 mg/dL (ref 7–18)
CALCIUM: 8.7 mg/dL (ref 8.5–10.1)
Chloride: 101 mmol/L (ref 98–107)
Co2: 27 mmol/L (ref 21–32)
Creatinine: 1.11 mg/dL (ref 0.60–1.30)
EGFR (African American): >60
GFR CALC NON AF AMER: 60 — AB
Glucose: 282 mg/dL — ABNORMAL HIGH (ref 65–99)
OSMOLALITY: 279 (ref 275–301)
Potassium: 3.8 mmol/L (ref 3.5–5.1)
SODIUM: 135 mmol/L — AB (ref 136–145)

## 2014-05-04 LAB — TROPONIN I: Troponin-I: <0.02 ng/mL

## 2014-05-04 LAB — PRO B NATRIURETIC PEPTIDE: B-Type Natriuretic Peptide: 10 pg/mL (ref 0–125)

## 2014-05-10 ENCOUNTER — Other Ambulatory Visit: Payer: Self-pay | Admitting: Orthopedic Surgery

## 2014-05-10 LAB — CBC WITH DIFFERENTIAL/PLATELET
BASOS PCT: 1.4 %
Basophil #: 0.1 10*3/uL (ref 0.0–0.1)
Eosinophil #: 0.1 10*3/uL (ref 0.0–0.7)
Eosinophil %: 2.1 %
HCT: 45.1 % (ref 35.0–47.0)
HGB: 14.5 g/dL (ref 12.0–16.0)
LYMPHS PCT: 24 %
Lymphocyte #: 1.7 10*3/uL (ref 1.0–3.6)
MCH: 29.9 pg (ref 26.0–34.0)
MCHC: 32.2 g/dL (ref 32.0–36.0)
MCV: 93 fL (ref 80–100)
MONO ABS: 0.6 x10 3/mm (ref 0.2–0.9)
Monocyte %: 7.9 %
NEUTROS ABS: 4.7 10*3/uL (ref 1.4–6.5)
Neutrophil %: 64.6 %
PLATELETS: 278 10*3/uL (ref 150–440)
RBC: 4.86 10*6/uL (ref 3.80–5.20)
RDW: 15.3 % — AB (ref 11.5–14.5)
WBC: 7.3 10*3/uL (ref 3.6–11.0)

## 2014-05-10 LAB — BASIC METABOLIC PANEL
ANION GAP: 5 — AB (ref 7–16)
BUN: 13 mg/dL (ref 7–18)
CHLORIDE: 99 mmol/L (ref 98–107)
Calcium, Total: 9 mg/dL (ref 8.5–10.1)
Co2: 28 mmol/L (ref 21–32)
Creatinine: 0.95 mg/dL (ref 0.60–1.30)
EGFR (Non-African Amer.): >60
GLUCOSE: 457 mg/dL — AB (ref 65–99)
OSMOLALITY: 285 (ref 275–301)
POTASSIUM: 4.2 mmol/L (ref 3.5–5.1)
Sodium: 132 mmol/L — ABNORMAL LOW (ref 136–145)

## 2014-05-10 LAB — SEDIMENTATION RATE: Erythrocyte Sed Rate: 14 mm/hr (ref 0–20)

## 2014-05-14 IMAGING — CT NM BONE W/ SPECT
2 series · 10 of 14 positions shown, 12 images · non-contrast
Comparison: none

REASON FOR EXAM: 3 Phase RT knee pain hardware  loosenig  versus infection
COMMENTS:

[Series 4: 3d bone 1.25 b70s · axial · 0.98mm/px · z∈[+599,+851]mm · 5 of 541 slices shown, 7 images (1 of 2)]
[im 91/541  soft-tissue]
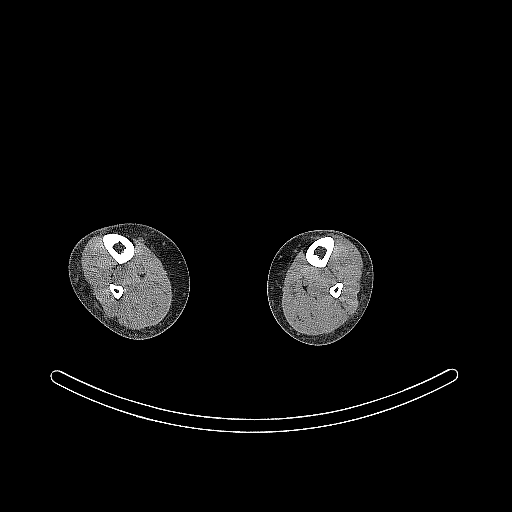
[im 91/541  bone]
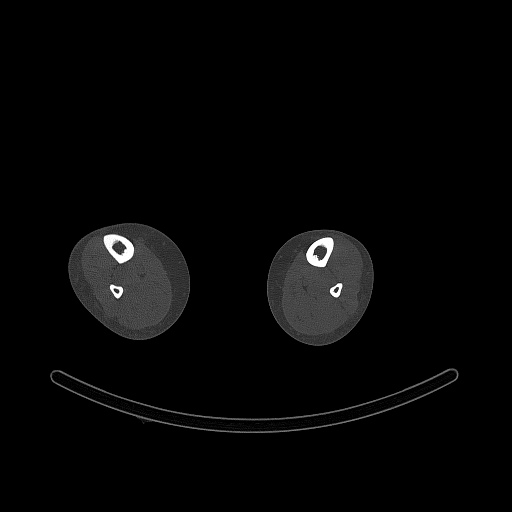
[im 181/541  bone]
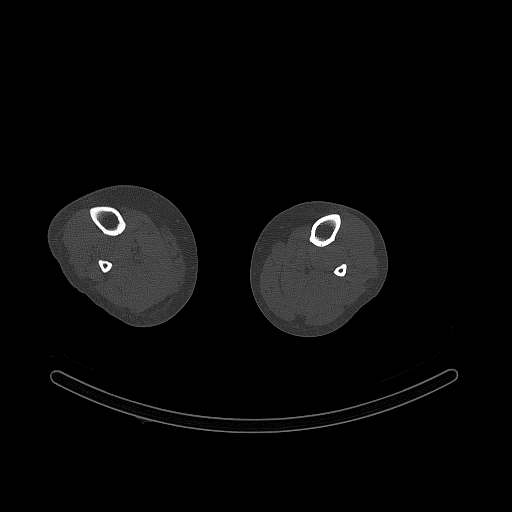
[im 271/541  bone]
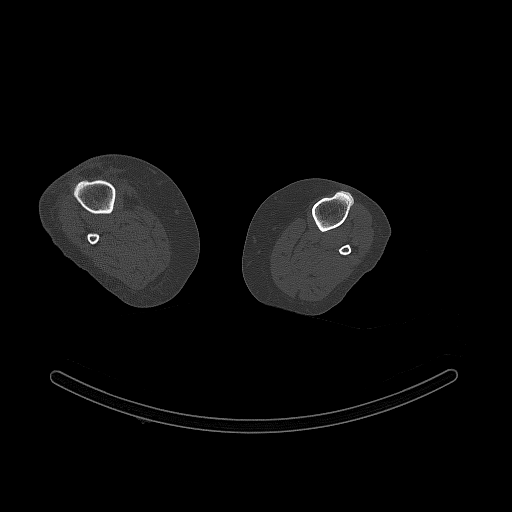
[im 361/541  bone]
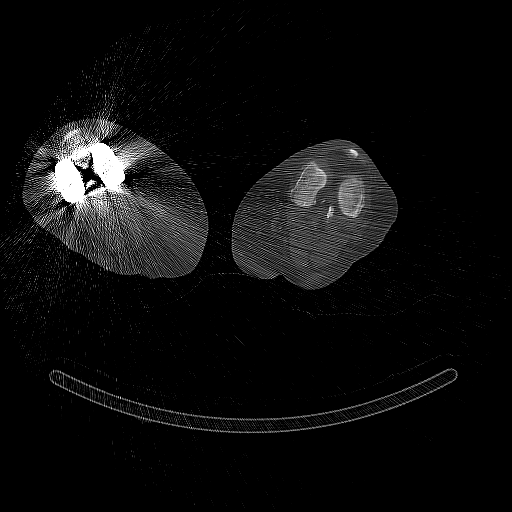
[im 451/541  soft-tissue]
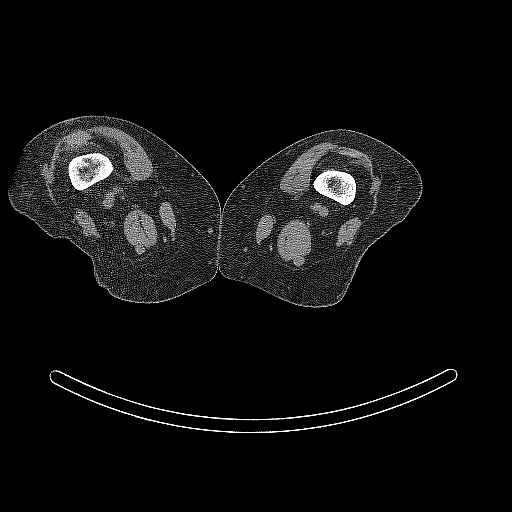
[im 451/541  bone]
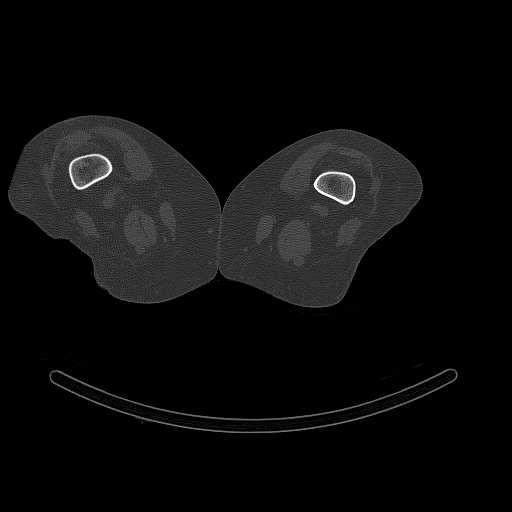

[Series 12: 3d bone 1.25 b70s · axial · 0.98mm/px · z∈[+592,+844]mm · 5 of 542 slices shown (2 of 2)]
[im 91/542  bone]
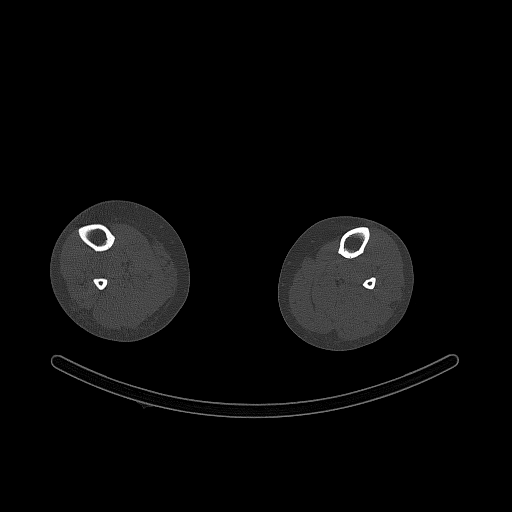
[im 181/542  bone]
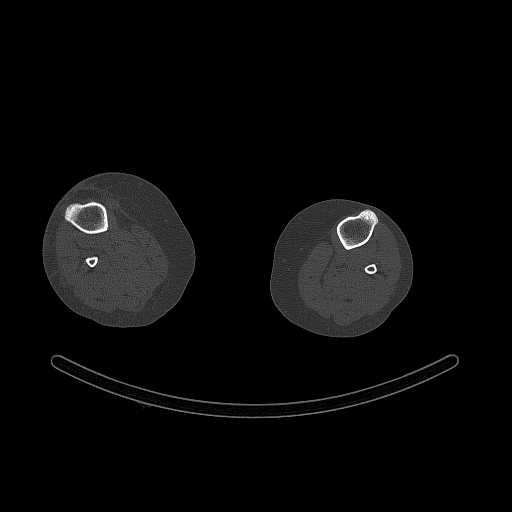
[im 271/542  bone]
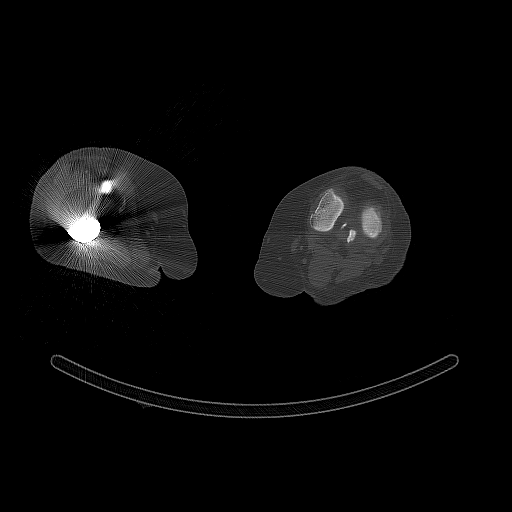
[im 361/542  bone]
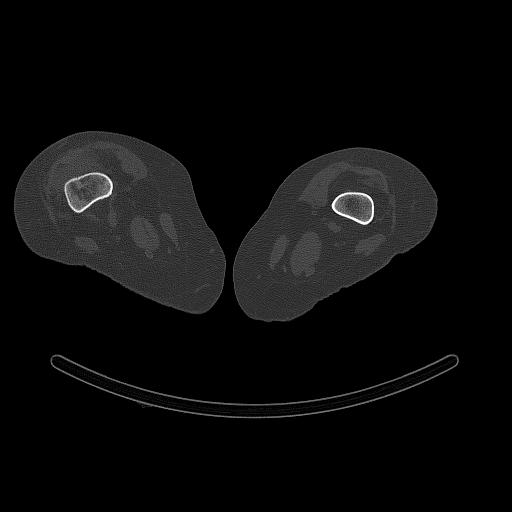
[im 451/542  bone]
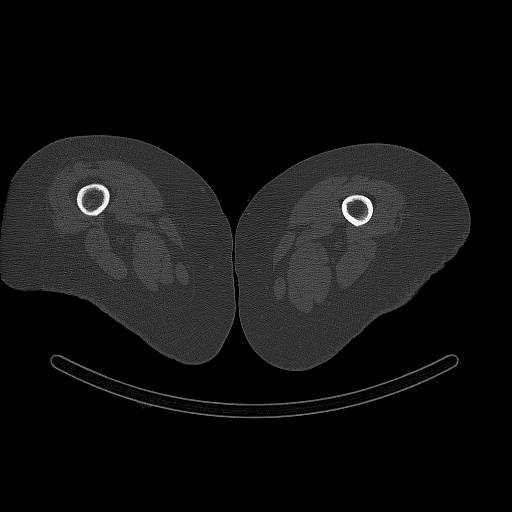

[10 of 14 positions shown; findings below may reference images not displayed]

PROCEDURE:     NM  - NM BONE SPECT  - August 01, 2013  [DATE]

RESULT:     A three-phase nuclear bone scan was performed with SPECT CT
imaging. This is centered over the the knees. The patient received 21.22 mCi
of technetium 99m labeled MDP for this study. Three phase imaging was then
performed.

On the flow images there is increased delivery of the radiopharmaceutical to
the right knee region generally consistent with hyperemia. On the blood pool
images this increased delivery persists. On the delayed images again noted
is increased uptake of the radiopharmaceutical. Uptake is demonstrated on
both sides of the joint.
IMPRESSION: There is abnormal 3 phase activity over the right knee.
This is suspicious for infection given the prompt relatively high-grade
delivery of the radiopharmaceutical on the flow images. However, it is not
diagnostic of infection. Consideration could be given for performing a
6ndium-RRR labeled white blood cell scan if infection is strongly suspected.

[REDACTED]

## 2014-05-14 IMAGING — NM NM MULTISYS EXAM
1 of 4 series · 2 of 14 positions shown, 3 images · non-contrast
Comparison: none

REASON FOR EXAM: rt knee   post surg  infection
COMMENTS:

[Series 2: ac bone 5.0 b08s · axial · 0.98mm/px · z∈[+542,+912]mm · 2 of 75 slices shown, 3 images]
[im 1/75  soft-tissue]
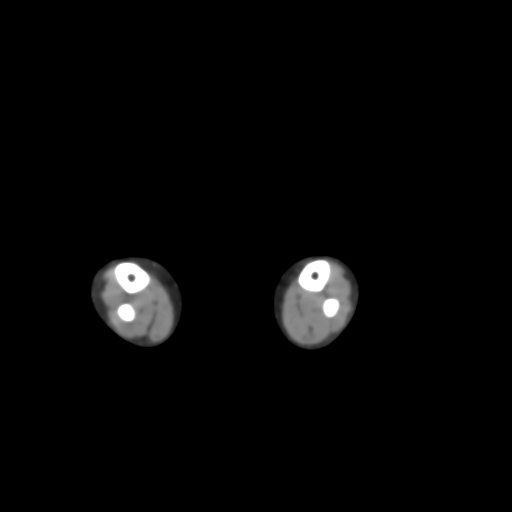
[im 1/75  bone]
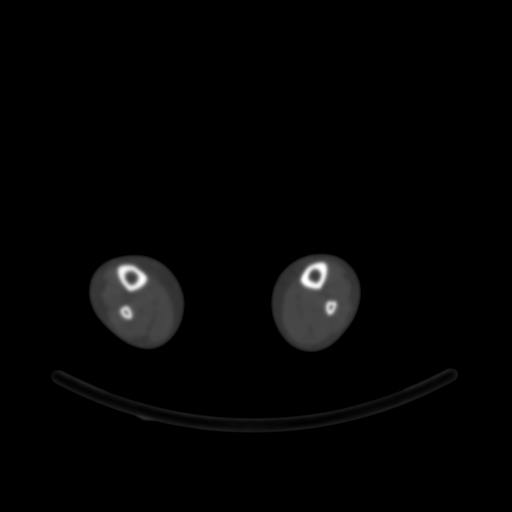
[im 75/75  bone]
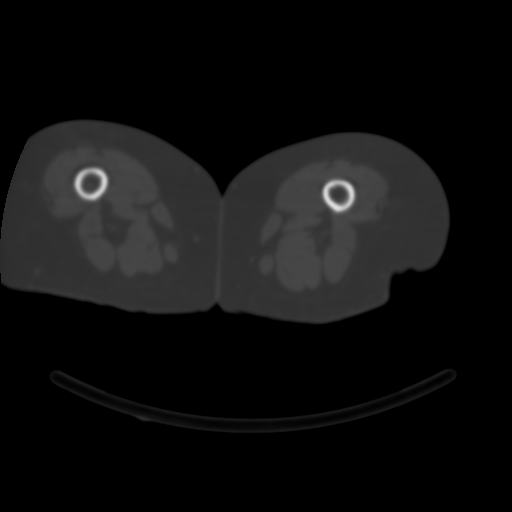

[2 of 14 positions shown; findings below may reference images not displayed]

PROCEDURE:     NM  - NM  IN - 111 WBC  LTD 48 HR   [DATE]  [DATE]

RESULT:     The patient received an injection of autologous white blood
cells labeled with 637.57 uCi of indium 111. SPECT CT is performed and 24
hour and 50 hour imaging. Plane are and SPECT images are evaluated. The
patient has a previous bone scan from 08/01/2013. It should be noted that the
patient did not return for the 72 hour imaging session.

The uptake is asymmetrically mildly increased in the area of what appears to
be the bone hardware interface in the distal right femur concerning for
underlying infection. This correlates with a three-phase scan performed
earlier. The study otherwise is unremarkable.
IMPRESSION: 1. Asymmetric increased localization in the right distal femur concerning
for underlying infection. This appears to be in the area of the
bone/hardware interface. Correlate with clinical and laboratory data.(*)

## 2014-05-16 DIAGNOSIS — E1142 Type 2 diabetes mellitus with diabetic polyneuropathy: Secondary | ICD-10-CM | POA: Insufficient documentation

## 2014-06-15 ENCOUNTER — Emergency Department: Payer: Self-pay | Admitting: Internal Medicine

## 2014-07-26 ENCOUNTER — Emergency Department: Payer: Self-pay | Admitting: Emergency Medicine

## 2014-08-18 ENCOUNTER — Emergency Department: Payer: Self-pay | Admitting: Student

## 2014-09-24 DIAGNOSIS — IMO0002 Reserved for concepts with insufficient information to code with codable children: Secondary | ICD-10-CM | POA: Insufficient documentation

## 2014-09-24 DIAGNOSIS — H908 Mixed conductive and sensorineural hearing loss, unspecified: Secondary | ICD-10-CM | POA: Insufficient documentation

## 2014-09-24 DIAGNOSIS — H9202 Otalgia, left ear: Secondary | ICD-10-CM | POA: Insufficient documentation

## 2014-10-23 DIAGNOSIS — E119 Type 2 diabetes mellitus without complications: Secondary | ICD-10-CM | POA: Insufficient documentation

## 2014-12-18 IMAGING — CR DG CHEST 2V
1 series · 2 of 2 positions shown · non-contrast
Comparison: PA and lateral chest x-ray dated October 12, 2012.

CLINICAL DATA: Chest heaviness, cough, and shortness of breath.

EXAM:
CHEST  2 VIEW

[Series 1: pa · 0.17mm/px · 2 of 2 slices shown]
[im 1/2]
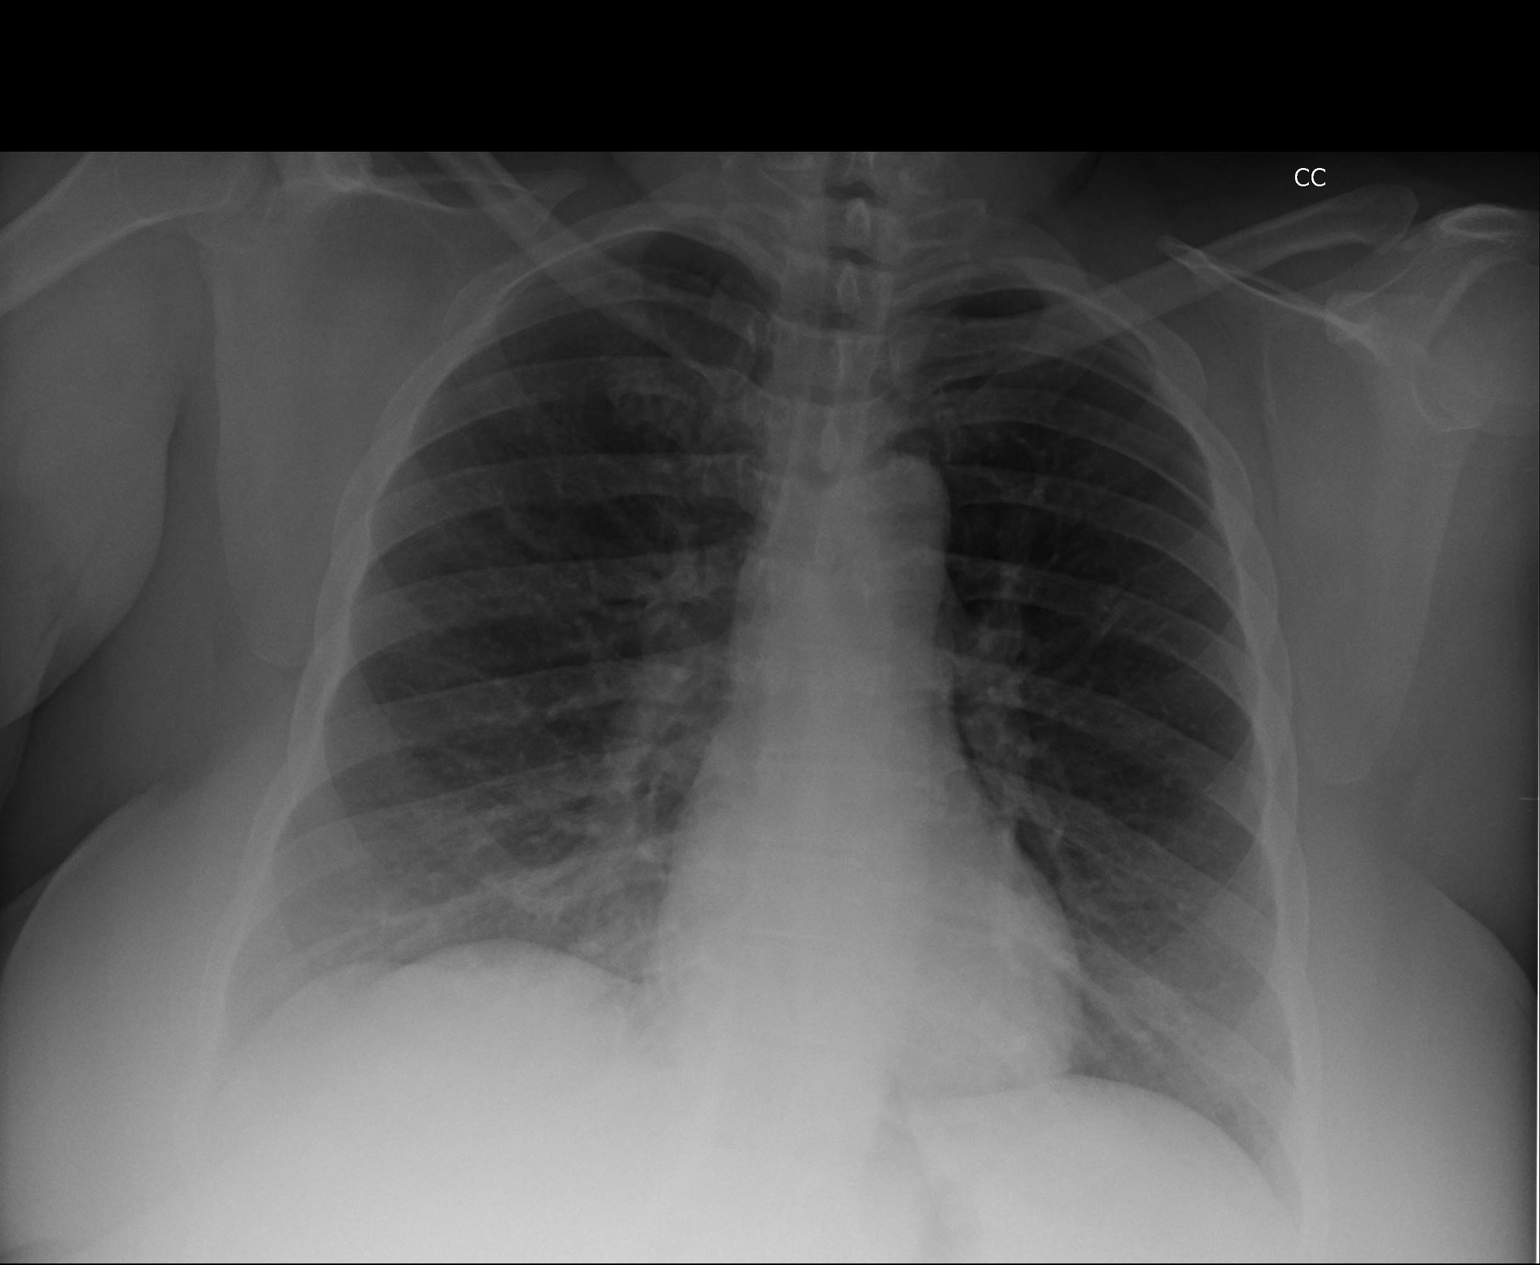
[im 2/2]
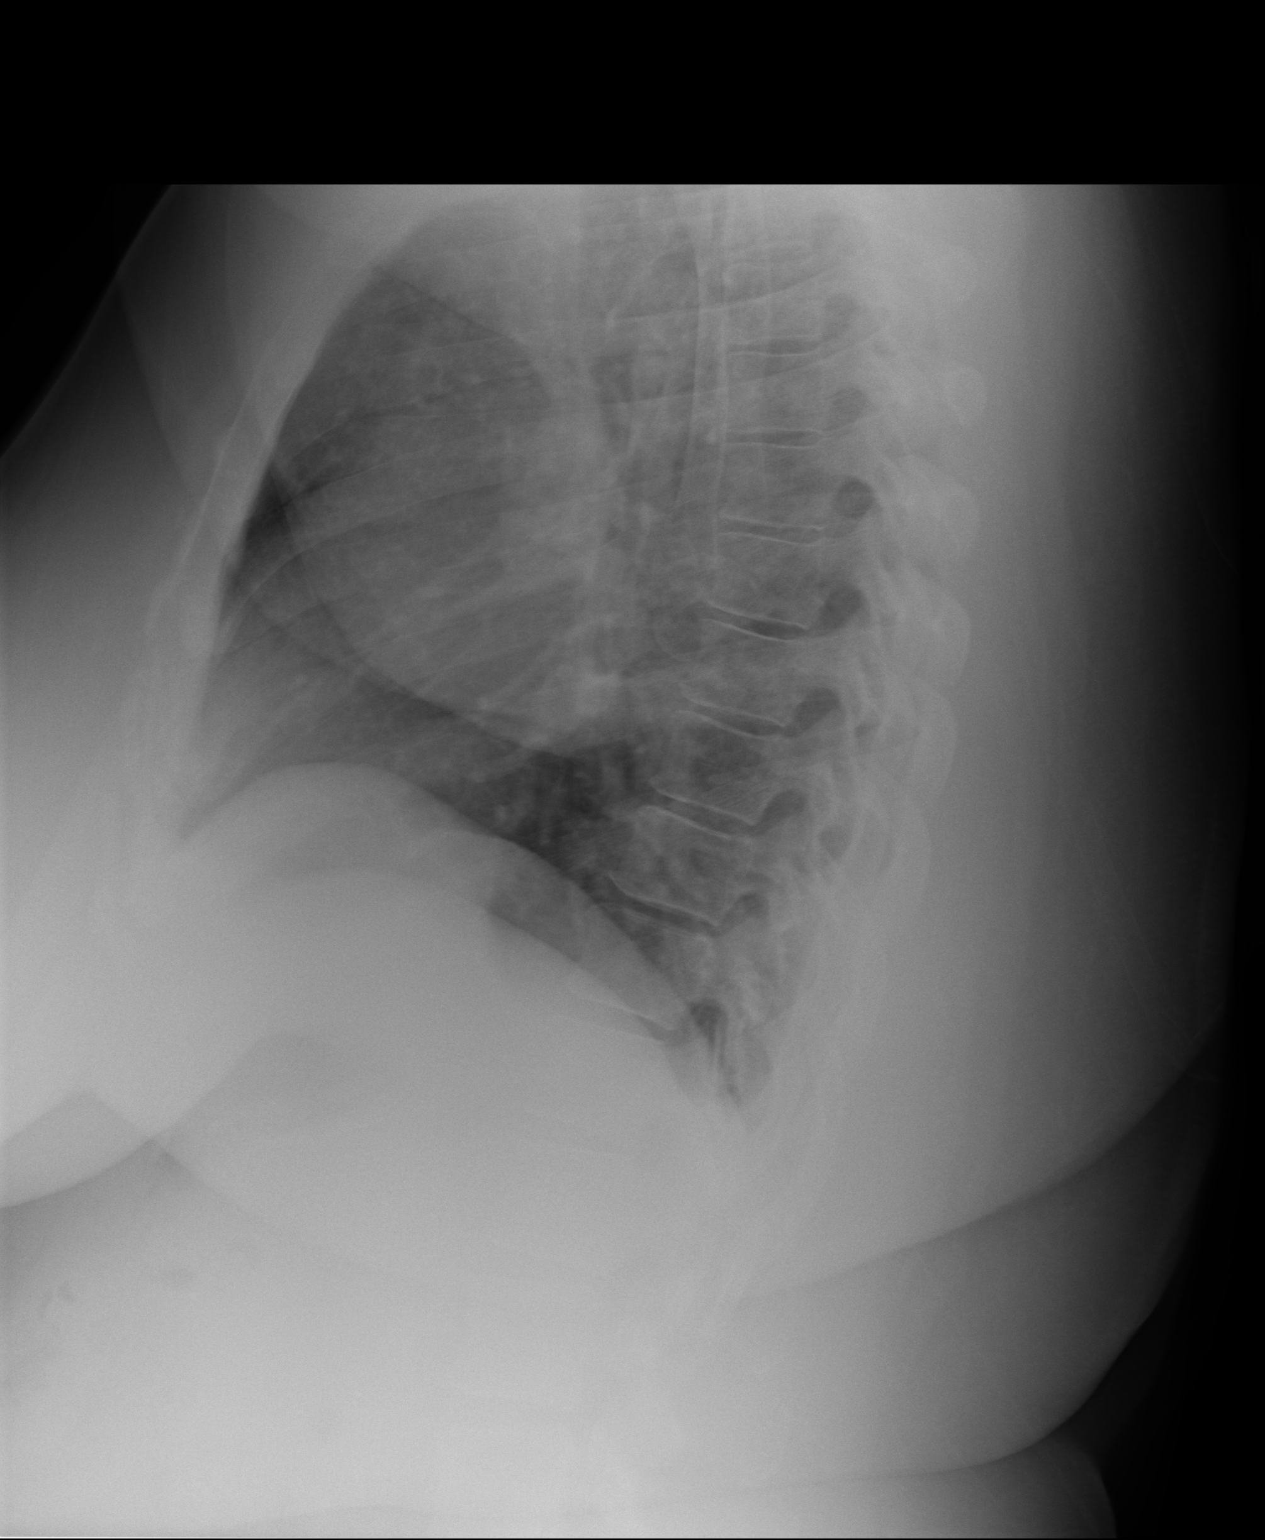

[2 of 2 positions shown; findings below may reference images not displayed]

FINDINGS: The lungs are adequately inflated. There are coarse lung markings in
the right infrahilar region which may reflect subsegmental
atelectasis or developing pneumonia. There is no pleural effusion or
pneumothorax. The cardiopericardial silhouette is normal in size.
The pulmonary vascularity is not engorged. The mediastinum is normal
in width. There is no pleural effusion. The observed portions of the
bony thorax appear normal.
IMPRESSION: Coarse right infrahilar lung markings suggests atelectasis or
developing pneumonia. There is no evidence of CHF nor pleural
effusion.

## 2015-02-14 IMAGING — US US EXTREM LOW VENOUS BILAT
1 series · 13 of 24 positions shown · non-contrast
Comparison: Right lower extremity Doppler dated 02/01/2013

CLINICAL DATA: Pain/ swelling, right greater than left, shortness
of breath



[Series 1: us extrem low venous bilat · 0.06mm/px · 13 of 59 slices shown]
[im 1/59]
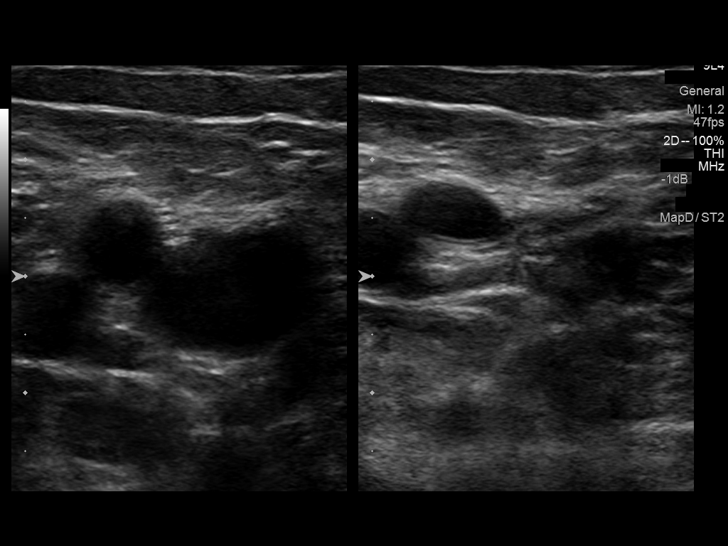
[im 6/59]
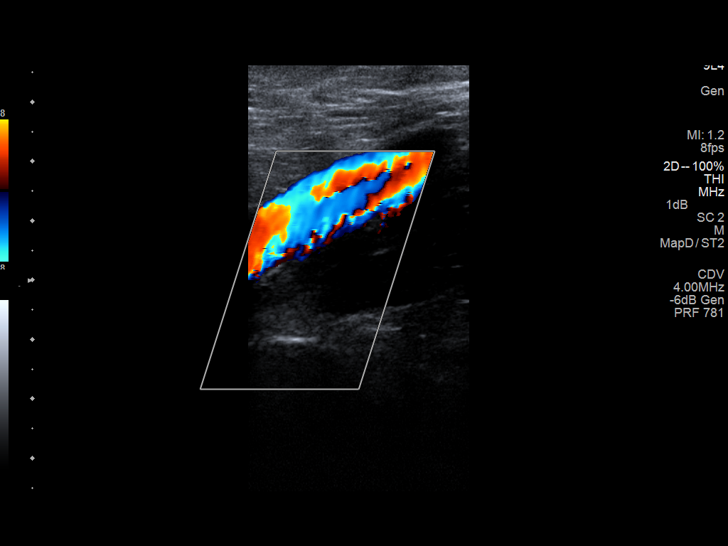
[im 11/59]
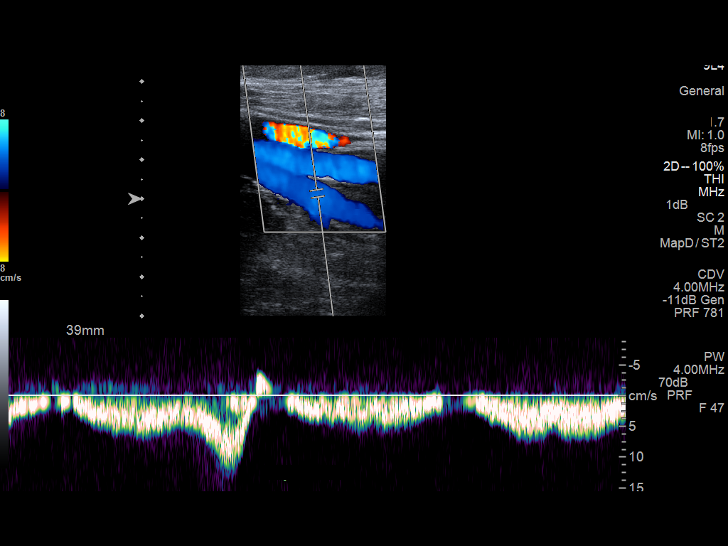
[im 16/59]
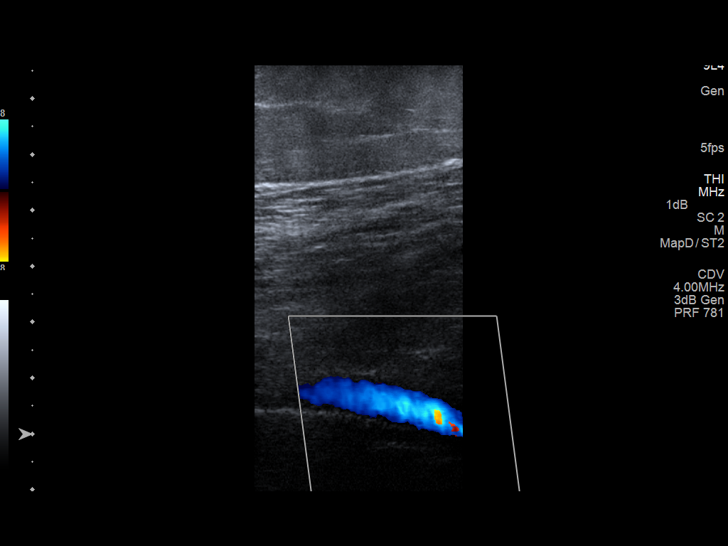
[im 21/59]
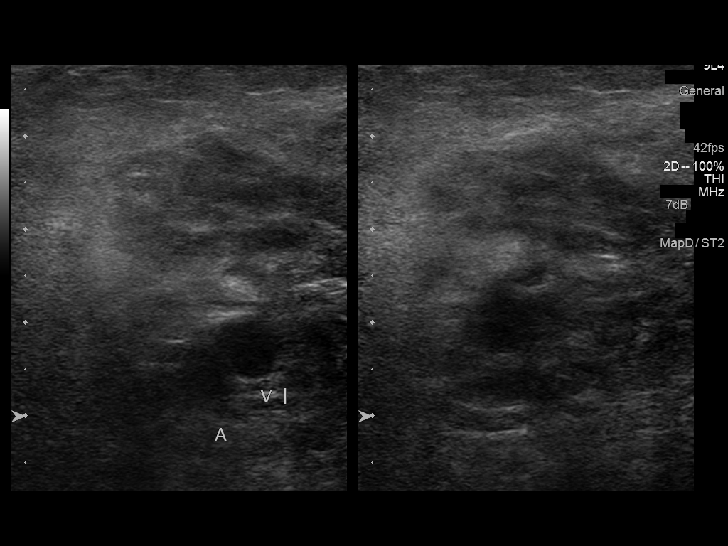
[im 26/59]
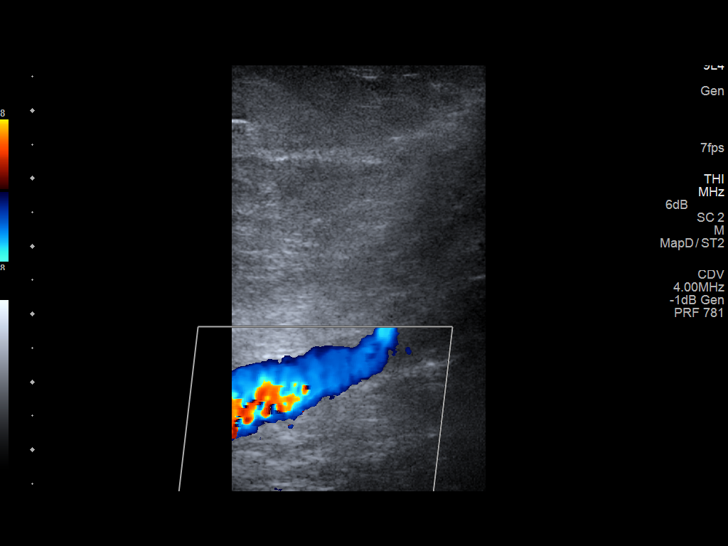
[im 31/59]
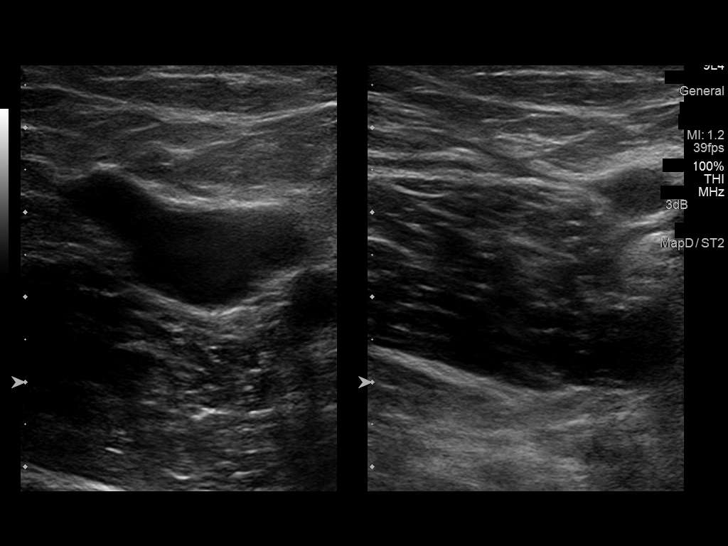
[im 33/59]
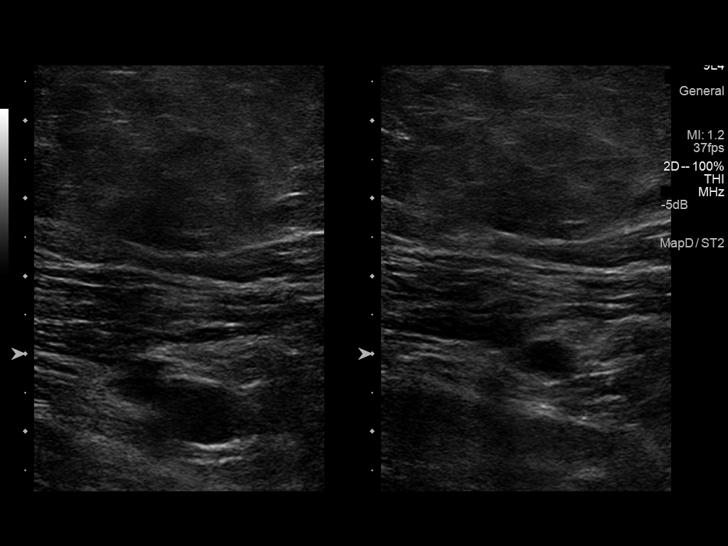
[im 38/59]
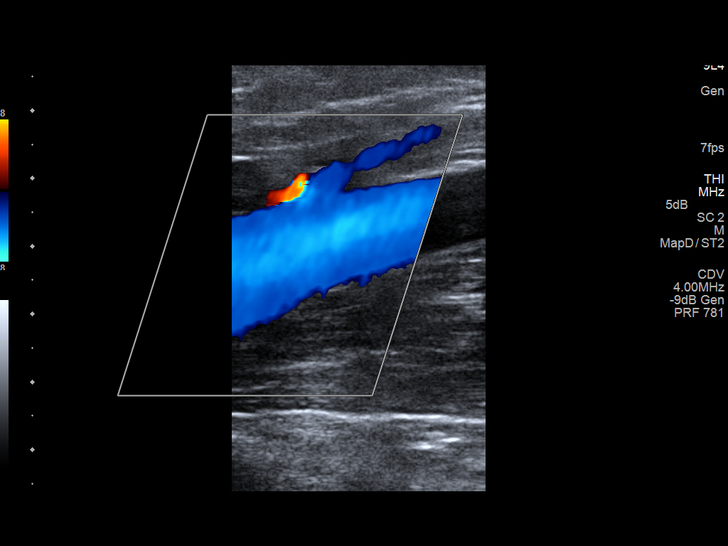
[im 43/59]
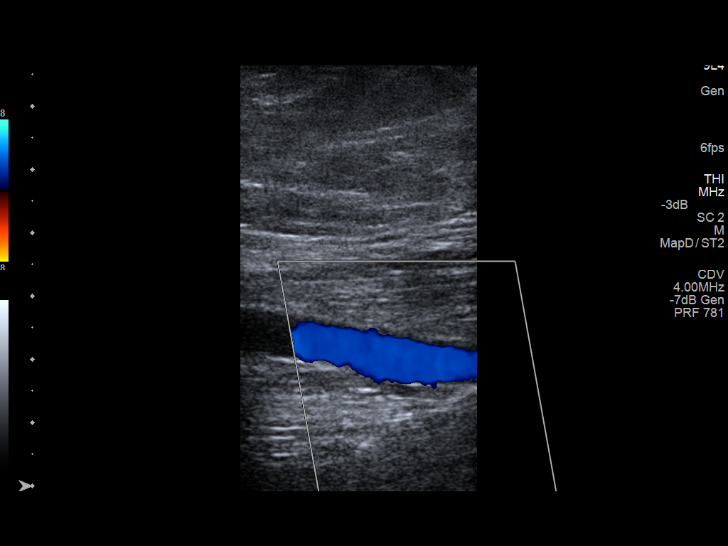
[im 48/59]
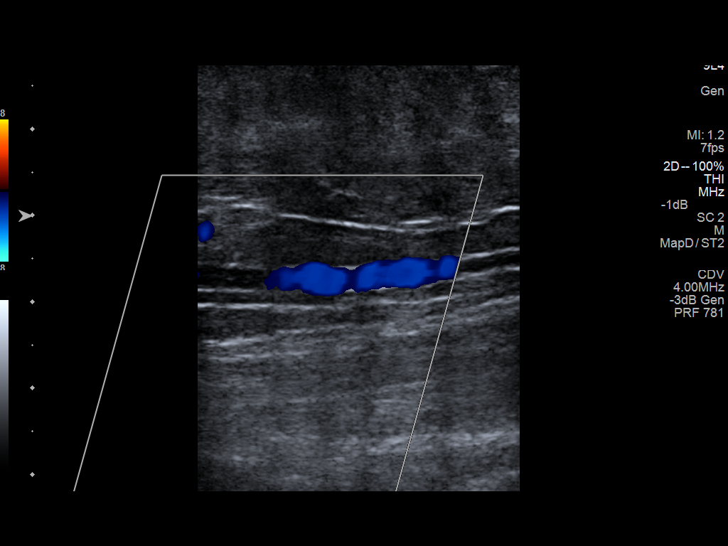
[im 53/59]
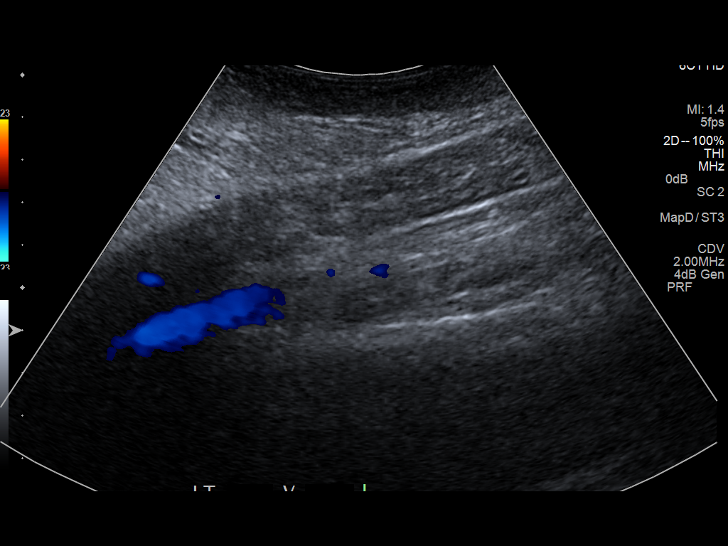
[im 59/59]
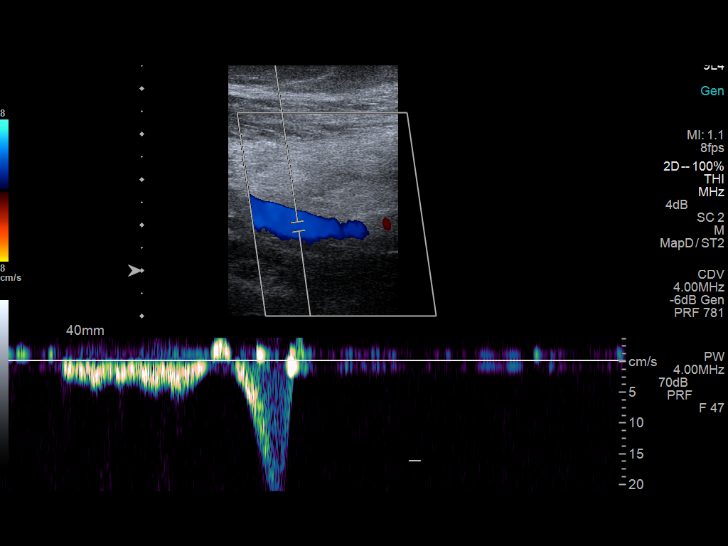

[13 of 24 positions shown; findings below may reference images not displayed]

FINDINGS: RIGHT LOWER EXTREMITY

Visualized right lower extremity deep venous system appears patent.

Normal compressibility. Patent color Doppler flow. Satisfactory
spectral Doppler with respiratory variation and response to
augmentation.

Greater saphenous vein, where visualized, is patent and
compressible.

LEFT LOWER EXTREMITY

Visualized left lower extremity deep venous system appears patent.

Normal compressibility. Patent color Doppler flow. Satisfactory
spectral Doppler with respiratory variation and response to
augmentation.

Greater saphenous vein, where visualized, is patent and
compressible.
IMPRESSION: No deep venous thrombosis in the visualized bilateral lower
extremities.

## 2015-02-14 IMAGING — CR DG CHEST 2V
1 series · 3 of 3 positions shown · non-contrast
Comparison: 03/07/2014

CLINICAL DATA: Bilateral leg swelling, shortness of breath

EXAM:
CHEST  2 VIEW

[Series 2: w chest lat · 0.14mm/px · 3 of 3 slices shown]
[im 1/3]
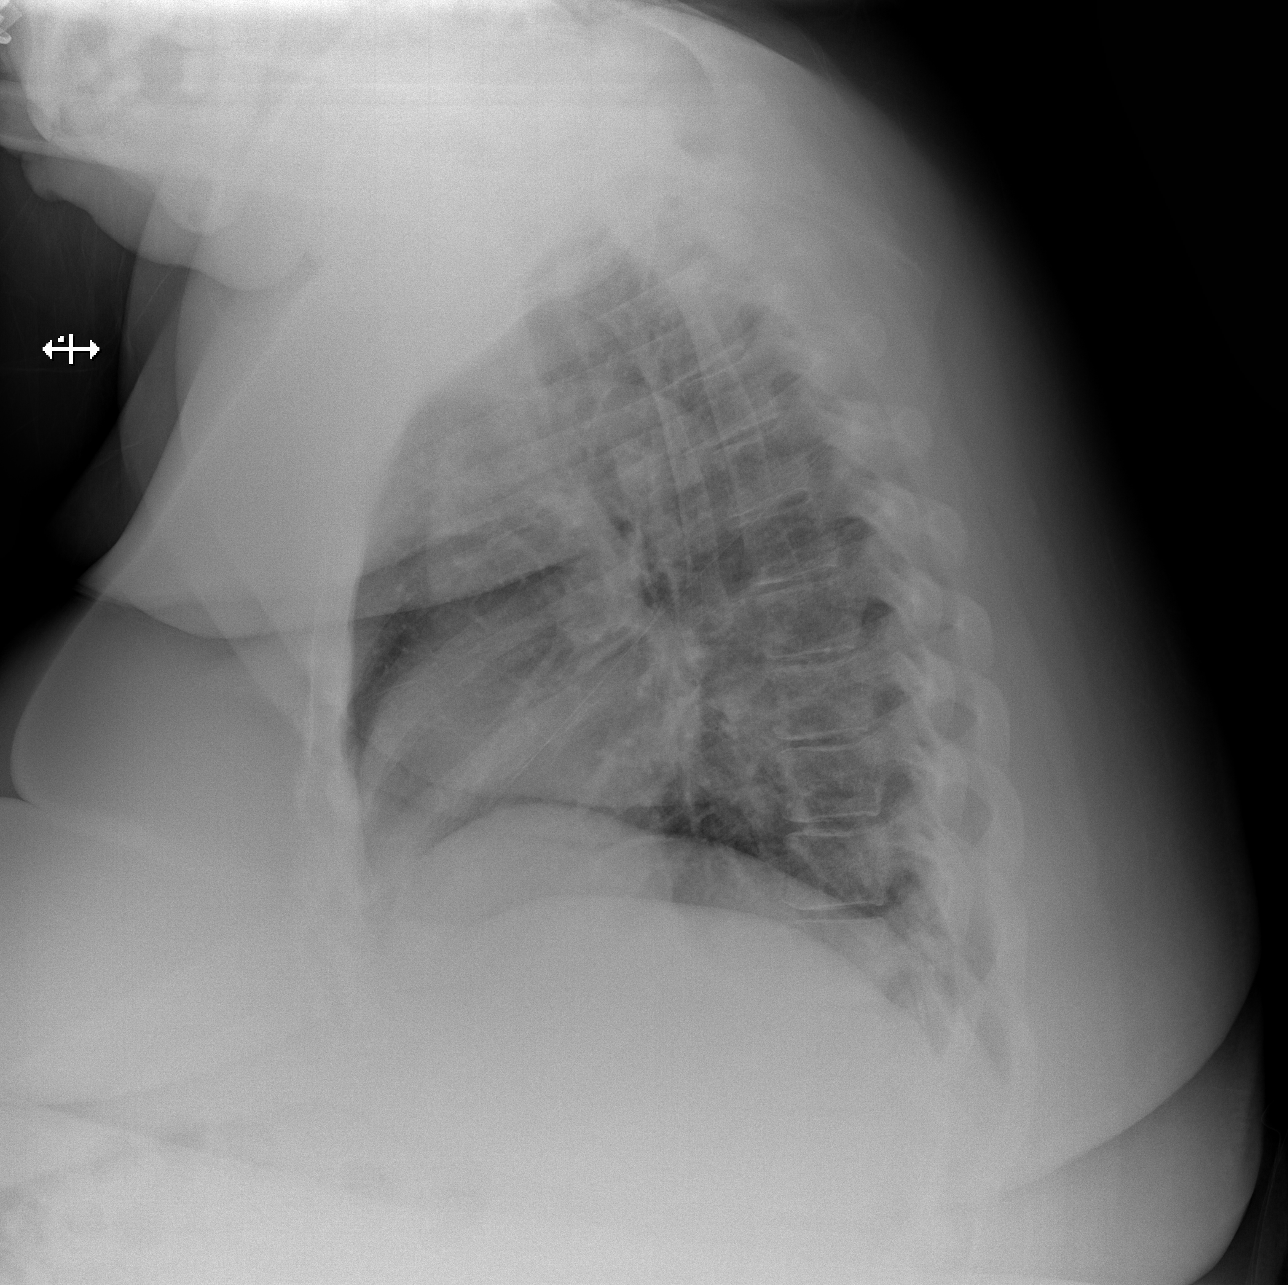
[im 2/3]
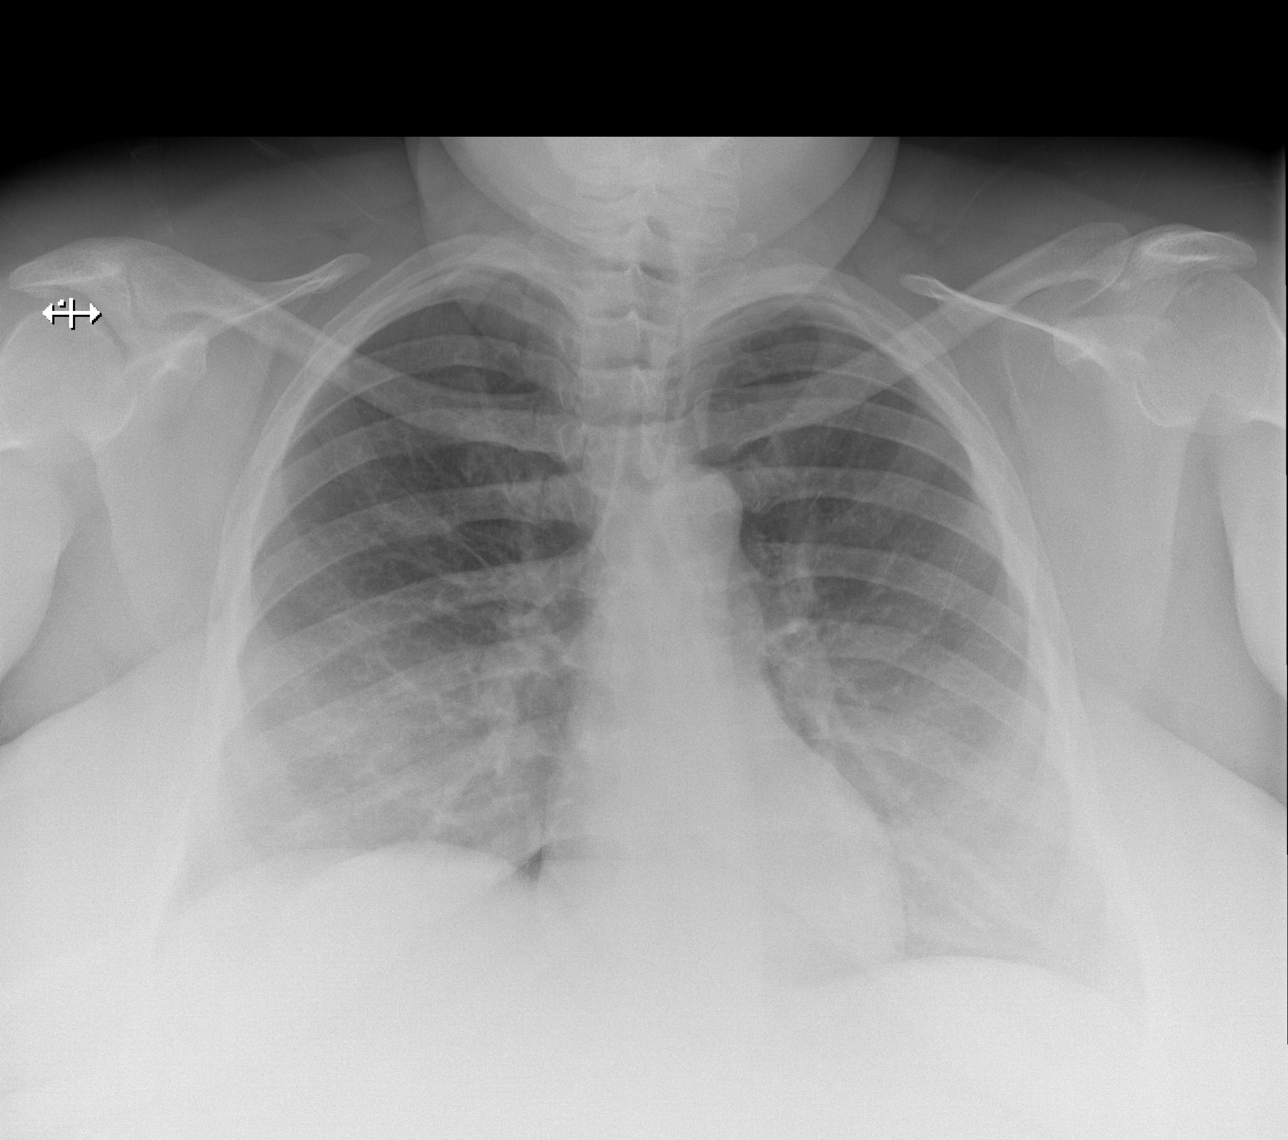
[im 3/3]
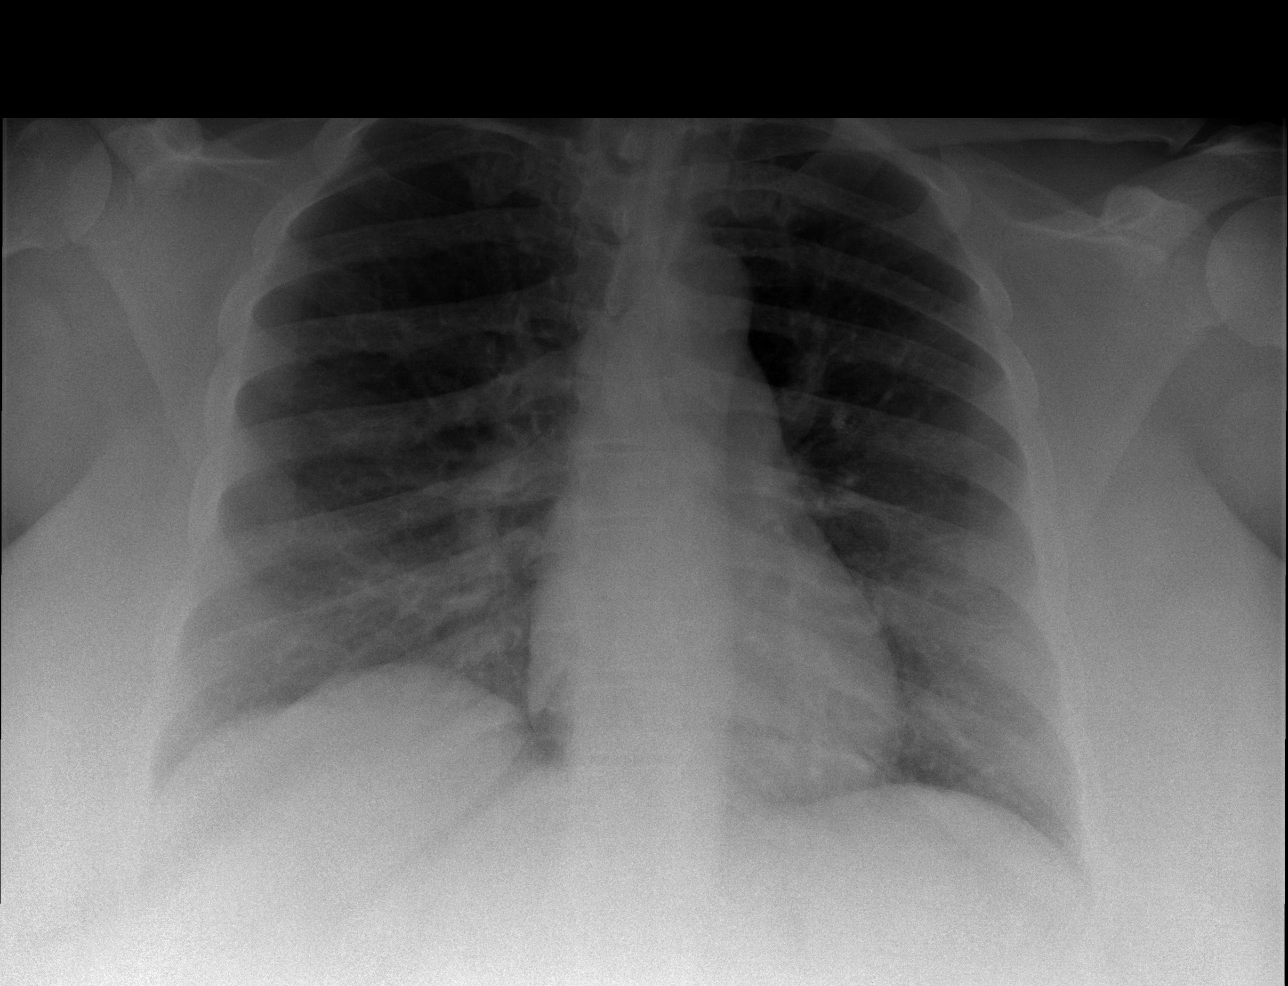

[3 of 3 positions shown; findings below may reference images not displayed]

FINDINGS: Cardiomediastinal silhouette is stable. No acute infiltrate or
pleural effusion. No pulmonary edema. Bony thorax is unremarkable.
IMPRESSION: No active cardiopulmonary disease.

## 2015-02-17 DIAGNOSIS — M25569 Pain in unspecified knee: Secondary | ICD-10-CM | POA: Insufficient documentation

## 2015-03-14 NOTE — Discharge Summary (Signed)
PATIENT NAME:  Brenda Hicks, LYNDS MR#:  [id number] DATE OF BIRTH:  [birth year]  DATE OF ADMISSION:  12/26/2012 DATE OF DISCHARGE:  12/29/2012  ADMITTING DIAGNOSIS:  Status post right total knee arthroplasty.   HISTORY: The patient is a 49 year old female who has had several years of right knee pain secondary to severe osteoarthritis. This was documented by x-ray. The patient has failed all conservative management including nonsteroidal anti-inflammatories and corticosteroid injections. The patient elected to undergo a right total knee arthroplasty for pain given that her symptoms were limiting her activities of daily living.   The patient underwent an uncomplicated right total knee arthroplasty on 12/26/2012, and was admitted to the orthopedic floor postoperatively. The patient had a Foley catheter in place and was continued on postop antibiotics for 24 hours. She was on Lovenox for DVT prophylaxis as  well. Given her baseline weight of 151 kg, she was placed on 40 mg b.i.d. per the pharmacy. The patient also had a medical consult called for assistance with management of her diabetes in the postop period. The patient orthopedically did well postop.   On postop check immediately following surgery she had clean dressings. She did complain of pain and was increased on her narcotic medication. She had a Polar Care and TENS unit as well. She was placed back on her Neurontin, which she takes at baseline, and was in a knee immobilizer.   On postoperative day #1 her Hemovac drain was removed. Her dressing remained clean, dry, and intact, and she remained neurovascularly intact as well.   On postoperative day #2 the patient continued to make progress. She was out of bed to a chair. Her pain was better controlled. She was able to ambulate into the hallway with physical therapy. She continued on Lovenox, TED stockings, and (Dictation Anomaly) <<MISSING TEXT>> for DVT prophylaxis. The patient was found to have a UTI  upon removal of the Foley catheter and was started on p.o. Cipro. Her hemoglobin A1c was 10, and her medications were adjusted by the hospitalist.   By postoperative day #3 the patient was getting around well with physical therapy. She was cleared by the medical service for discharge as well. Her blood sugars were improving.    Given the patient's clinical improvement, she was prepared for discharge. Her dressing had been  changed on postoperative day #2 and was continued to be monitored throughout her hospitalization. Her incision was clean, dry, and intact, without drainage. She remained neurovascularly intact in the lower extremities. She does have baseline diabetic neuropathy.   DISCHARGE INSTRUCTIONS: The patient was discharged home with instructions to continue to keep her incision clean, dry and intact. She will be set up with home health for physical therapy.   She will remain on Lovenox 40 mg b.i.d., and was discharged on oxycodone for pain control.   She will see me back in the office in 7 to 10 days. I explained this plan to the patient. She understood and agreed with this plan.   DISCHARGE DIAGNOSES:   1. Status post right total knee arthroplasty.  2. Uncontrolled diabetes.  3. Urinary tract infection.     ____________________________ Timoteo Gaul, MD klk:dm D: 01/15/2013 09:15:00 ET T: 01/15/2013 10:21:26 ET JOB#: [id number]  cc: Timoteo Gaul, MD, <Dictator>

## 2015-03-14 NOTE — H&P (Signed)
PATIENT NAME:  Brenda Hicks, Brenda Hicks MR#:  [id number] DATE OF BIRTH:  07/08/1966  DATE OF ADMISSION:  07/09/2013  PRIMARY CARE PROVIDER: Steele Sizer, MD  ED REFERRING PHYSICIAN: Lavonia Drafts, MD   CHIEF COMPLAINT: Left lower abdominal pain.   HISTORY OF PRESENT ILLNESS: The patient is a 49 year old African American female with history of diabetes, hypertension, hyperlipidemia, COPD with ongoing tobacco abuse, obstructive sleep apnea, osteoarthritis and diabetic neuropathy who states that she started having left lower quadrant abdominal pain for the past 2 days, constant in nature, sharp pain. She has not had any fevers or chills. The patient reports that she had tooth extraction 2 months ago and was treated with amoxicillin and Flagyl because of her right TKA, according to her. The patient denies any fevers or chills. No previous history of diverticulitis. She is nauseous but has not had any diarrhea. Denies any urinary frequency, urgency or hesitancy.   PAST MEDICAL HISTORY: Significant for: 1.  Diabetes.  2.  Hypertension.  3.  Hyperlipidemia.  4.  COPD with ongoing tobacco abuse.  5.  Obstructive sleep apnea, uses CPAP.  6.  Osteoarthritis.  7.  Diabetic neuropathy.   ALLERGIES: No known drug allergies.  CURRENT MEDICATIONS: 1.  Tylenol 650 mg q. 4 hours p.r.n. 2.  Cyclobenzaprine 10 mg 1 tab p.o. t.i.d. 3.  Iron sulfate 325 mg 1 tab p.o. b.i.d.  4.  Gabapentin 300 mg 3 tabs 3 times a day. 5.  Ibuprofen 800 mg 1 tab p.o. t.i.d. p.r.n. 6.  Insulin Detemir 50 units sub-Q at bedtime.  7.  Januvia 100 mg daily. 8.  Metformin 1000 mg 1 tab p.o. b.i.d.  9.  Montelukast 10 mg at bedtime. 10.  Norco 5/325 mg q. 4 to 6 hours p.r.n. for pain.  11.  NovoLog 5 units sub-Q b.i.d.  12.  Oxycodone 1 tab p.o. q. 6 hours. 13.  Ventolin 2 puffs q. 4 hours p.r.n. 14.   Zofran 4 mg 1 tab p.o. t.i.d. 15.  Ambien 10 mg at bedtime.   SOCIAL HISTORY: Smokes about 1 pack per day for greater than 20  years. Occasional alcohol use. No drugs. Lives at home with her husband.   FAMILY HISTORY: Mother with hypertension. Father died when she was young.  REVIEW OF SYSTEMS:  CONSTITUTIONAL: Denies any fevers, fatigue. No weakness. Has pain in the left lower quadrant. No weight loss. No weight gain.  EYES: No blurred or double vision. No pain. No redness. No inflammation. No glaucoma. No cataracts.  ENT: No tinnitus. No ear pain. No hearing loss. No seasonal or year-round allergies. No epistaxis. No nasal discharge. No postnasal drip. No sinus pain. RESPIRATORY: Denies any cough or hemoptysis. Has COPD. Complains of some wheezing.  CARDIOVASCULAR: Denies any chest pain, orthopnea, edema or arrhythmia.  GASTROINTESTINAL: Has some nausea but no vomiting or diarrhea. Has abdominal pain. No hematemesis. No melena. No GERD. No IBS.  No jaundice.  GENITOURINARY: Denies any dysuria, hematuria, renal calculus or frequency.  ENDOCRINE: Denies any polyuria, nocturia or thyroid problems.  HEMATOLOGIC AND LYMPHATIC: Denies any major bruisability or bleeding.  SKIN: No acne. No rash. No change in mole, hair or skin.  MUSCULOSKELETAL: Has chronic back pain and other arthritic type of pain. NEUROLOGIC:  No CVA, TIA or seizures.  PSYCHIATRIC: No anxiety. No insomnia. No ADD.   PHYSICAL EXAMINATION: VITAL SIGNS: Temperature 98.4, pulse 86, respirations 18, blood pressure 123/77, O2 96%.  GENERAL: The patient is a morbidly obese African American  female in no acute distress.  HEENT: Head atraumatic, normocephalic. Pupils equally round and reactive to light and accommodation. There is no conjunctival pallor. No scleral icterus. Nose: There are no nasal lesions or drainage. Ears: There is no drainage or external lesions. Mouth is moist. No exudates.  NECK: Supple and symmetric. No masses. Thyroid midline, not enlarged. No JVD.  LUNGS:  Good respiratory effort. Clear to auscultation bilaterally without any rales,  rhonchi or wheezing.  HEART: Regular rate and rhythm. No murmurs, gallops, clicks or heaves.  ABDOMEN: Soft. Positive left lower quadrant. No guarding. No rebound. Positive bowel sounds x 4.  GENITOURINARY: Deferred.  MUSCULOSKELETAL: There is no erythema or swelling.  SKIN: No rash.  LYMPHATICS: No lymph nodes palpable.  VASCULAR: Good DP and PT pulses.  NEUROLOGICAL: Cranial nerves II through XII grossly intact. Strength is 5 out of 5 in all 4 extremities.  PSYCHIATRIC: Not anxious or depressed.   LABORATORY AND DIAGNOSTICS: Glucose 144, BUN 6, creatinine 0.86, sodium 136, potassium 3.6, chloride 101, CO2 30, calcium 9.2. Beta-hCG less than 1. LFTs: Are normal except albumin of 3.2. WBC 10.8, hemoglobin 13.3 and platelet count 313.   CT scan of the abdomen and pelvis shows diverticulosis with findings of acute sigmoid diverticulitis without abscess formation. There may be some involvement in the distal descending colon as well. Colitis is not excluded.   ASSESSMENT AND PLAN: The patient is a 49 year old with history of chronic obstructive pulmonary disease, diabetes, hypertension and hyperlipidemia who presents with severe left lower quadrant abdominal pain, noted to have acute diverticulitis.  1.  Left lower quadrant abdominal pain due to acute diverticulitis. Due to her severity of her symptoms, at this time she will need IV antibiotics. We will treat her with IV Cipro and Flagyl. Follow and monitor her symptoms. If does not improve, consider GI evaluation. I will place her on full liquid diet.  2.  Diabetes. We will continue Levemir at half of the dose since she will be on full liquid diet. I will hold her Januvia and metformin for the time being, also in light of her receiving IV contrast.  3.  Hypertension. Blood pressure is currently normal. The patient is currently not on any antihypertensives. Will monitor her blood pressure.  4.  History of chronic obstructive pulmonary disease with  some wheezing. Will continue Ventolin. I will add nebulizers as needed.  5.  Nicotine addiction. The patient counseled regarding nicotine cessation. Four minutes spent. She is agreeable to try a nicotine patch.  6.  We will do CPAP for her sleep apnea at bedtime as taking at home.  7.  Miscellaneous.  We will place her on Lovenox for deep vein thrombosis prophylaxis.  NOTE: 50 minutes spent on this H and P.  ____________________________ Lafonda Mosses. Posey Pronto, MD shp:sb D: 07/09/2013 13:10:39 ET T: 07/09/2013 13:56:35 ET JOB#: [id number]  cc: Joslynne Klatt H. Posey Pronto, MD, <Dictator> Alric Seton MD ELECTRONICALLY SIGNED 07/09/2013 16:33

## 2015-03-14 NOTE — Consult Note (Signed)
PATIENT NAME:  Brenda Hicks, Brenda Hicks MR#:  [id number] DATE OF BIRTH:  [birth year]  DATE OF CONSULTATION:  12/27/2012  REFERRING PHYSICIAN:  Thornton Park, MD CONSULTING PHYSICIAN:  Belia Heman. Verdell Carmine, MD  PRIMARY CARE PHYSICIAN:  Etter Sjogren, MD   REASON FOR CONSULTATION: Diabetes and medical management.   HISTORY OF PRESENT ILLNESS: This is a 49 year old female who presented to the hospital due to right knee pain, given history of severe osteoarthritis underwent a right total knee replacement. Postoperatively, the patient's blood sugars have been somewhat elevated. Hospitalist services were contacted for medical management and diabetes management. The patient presently is complaining of some right knee pain. Although denies any chest pain, shortness of breath. Admits to nausea, but no vomiting. No fevers or chills, no cough and no other associated symptoms presently. Her blood sugars have been ranging anywhere from the low 200s to mid 200s.   REVIEW OF SYSTEMS:  CONSTITUTIONAL: No documented fever. No weight gain or weight loss.  EYES: No blurry or double vision.  ENT: No tinnitus, no postnasal drip, no redness of the oropharynx.  RESPIRATORY: No cough, no wheeze, no hemoptysis, no dyspnea.  CARDIOVASCULAR: No chest pain, no orthopnea, no palpitations, no syncope.  GASTROINTESTINAL: Positive nausea, no vomiting, no diarrhea, no abdominal pain, no melena, no hematochezia.  GENITOURINARY: No dysuria or hematuria.  ENDOCRINE: No polyuria or nocturia. No heat or cold intolerance.   HEMATOLOGIC: No anemia, no bruising, no bleeding.  INTEGUMENTARY: No rashes. No lesions.  MUSCULOSKELETAL: No arthritis, no swelling, no gout. Positive osteoarthritis.  NEUROLOGIC: No numbness, tingling. No ataxia, no seizure activity.  PSYCHIATRIC: No anxiety, no insomnia, no ADD.   PAST MEDICAL HISTORY: Consistent with diabetes, hypertension, hyperlipidemia, COPD with ongoing tobacco abuse, obstructive sleep apnea,  osteoarthritis, diabetic neuropathy.   ALLERGIES: The patient has no known drug allergies.   SOCIAL HISTORY: Does smoke about a pack per day, has been smoking for the past 20+ years. Occasional alcohol use. No illicit drug abuse. Lives at home with her husband.   FAMILY HISTORY: The patient's mother is alive, has hypertension and hypothyroidism. Father died when she was young kid.   CURRENT MEDICATIONS: As follows: Amlodipine, benazepril 5/40 one tab daily, Flexeril 10 mg t.i.d., gabapentin 300 mg t.i.d., indomethacin 50 mg one cap t.i.d., Januvia 100 mg daily, Levemir 50 units daily, medroxyprogesterone 10 mg daily, metformin 1000 mg b.i.d., Singulair 10 mg daily, NovoLog Flex pen 5 units b.i.d. with meals, simvastatin 20 mg daily, Ventolin inhaler 2 puffs every 4 hours as needed and Ambien 10 mg at bedtime.   PHYSICAL EXAMINATION:  Currently is as follows:   VITAL SIGNS: Temperature 98.3, pulse 102, respirations 20, blood pressure 152/95, sats 94% on room air.  GENERAL: She is a lethargic-appearing female but in no apparent distress.  HEENT: Atraumatic, normocephalic, extraocular muscles are intact. Pupils equal and reactive to light. Sclerae anicteric. No conjunctival injection. No pharyngeal erythema.  NECK: Supple. No jugular venous distention, no bruits, no lymphadenopathy or thyromegaly.  HEART: Regular rate and rhythm. Tachycardic. No murmurs, rubs, or clicks.  LUNGS: Clear to auscultation bilaterally. No rales, no rhonchi, no wheezes.  ABDOMEN: Soft, flat, nontender, nondistended. Has good bowel sounds. No hepatosplenomegaly appreciated.  EXTREMITIES: No evidence of any cyanosis, clubbing or peripheral edema. Has +2 pedal and radial pulses bilaterally. Right knee is in a dressing with a drain from recent right total knee replacement.  NEUROLOGIC: She is alert, awake and oriented x3 with no focal motor or sensory deficits  appreciated bilaterally.  SKIN: Moist and warm with no rashes.   LYMPHATIC: There is no cervical or axillary lymphadenopathy.   LABORATORY, DIAGNOSTIC, AND RADIOLOGICAL DATA: Serum glucose of 249, BUN 8, creatinine 0.8, sodium 131, potassium 4.1, chloride 98, bicarbonate 27. White cell count 13.1, hemoglobin 13.1, hematocrit 40.5, platelet count 254.   ASSESSMENT AND PLAN: This is a 49 year old female with a history of diabetes, diabetic neuropathy, hypertension, hyperlipidemia, GERD, chronic obstructive pulmonary disease with ongoing tobacco abuse, obstructive sleep apnea, osteoarthritis who presents to the hospital  for a total right knee replacement. Hospitalist service was contacted for medical management and elevated blood sugars.   1.  Uncontrolled diabetes. The patient's blood sugars are ranging anywhere from the low 200s to the mid 200s. I think this is likely secondary to noncompliance. The patient apparently was on Levemir but discontinued it as her sugars are doing okay. For now will restart her Levemir at low dose. Instead of 50 units I will give her 25 units, continue metformin, continue Januvia, continue sliding scale insulin for now, follow sugars. I will go ahead and check a hemoglobin A1c.  2.  Hypertension. Her blood pressure is slightly elevated, probably related to pain control. For now I will increase her Norvasc from 5 to 10 mg, continue benazepril for now.  3.   Diabetic neuropathy. Continue Neurontin.  4.  Chronic obstructive pulmonary disease. The patient has no evidence of acute chronic obstructive pulmonary disease exacerbation. Continue with albuterol inhaler as needed.  5.  Sore throat. The patient did not have general anesthesia. She does not have any evidence of oropharyngeal erythema or any exudates. I will start her on some Chloraseptic spray and Cepacol lozenges for now.  6.  Hyperlipidemia. Continue simvastatin.  7.  Status post right knee replacement postoperative day number 1. Continue care as per orthopedics. Continue Lovenox  for deep vein thrombosis prophylaxis. Continue physical therapy and occupational therapy and pain control as per ortho.  Thank you so much for the consultation. We will follow along with you.   TIME SPENT: 45 minutes      ____________________________ Belia Heman. Verdell Carmine, MD vjs:cc D: 12/27/2012 14:32:20 ET T: 12/27/2012 17:32:54 ET JOB#: [id number]  cc: Belia Heman. Verdell Carmine, MD, <Dictator> Henreitta Leber MD ELECTRONICALLY SIGNED 01/03/2013 13:28

## 2015-03-14 NOTE — Discharge Summary (Signed)
PATIENT NAME:  Brenda Hicks, MODE MR#:  [id number] DATE OF BIRTH:  July 09, 1966  DATE OF ADMISSION:  07/09/2013  ADMITTING PHYSICIAN:  Dr. Dustin Flock  DATE OF DISCHARGE:  07/10/2013  DISCHARGING PHYSICIAN:  Dr. Gladstone Lighter  PRIMARY PHYSICIAN:   Dr. Etter Sjogren   CONSULTATIONS IN THE HOSPITAL:   None.   DISCHARGE DIAGNOSES: 1.  Acute sigmoid diverticulitis.  2.  Diabetes mellitus.  3.  Obesity.  4.  Asthma. 5.  Ongoing smoking.  6.  Hyperlipidemia.  7.  COPD.   8.  Obstructive sleep apnea, and uses CPAP.  9.  Osteoarthritis.  10.  Diabetic neuropathy.   DISCHARGE HOME MEDICATIONS:  1.  Zolpidem 10 mg p.o. daily.  2.  Januvia 100 mg p.o. daily.  3.  Flexeril 10 mg p.o. t.i.d. p.r.n.  4.  Gabapentin 900 mg p.o. t.i.d.  5.  Montelukast 10 mg p.o. daily.  6.  Metformin 1000 mg p.o. b.i.d.  7.  Ventolin inhaler 2 puffs q. 4 hours p.r.n.  8.  NovoLog flex pen 5 units subcutaneously twice a day before meals, mostly lunch and dinner.  9.  Tylenol 650 mg q. 4 hours p.r.n. for pain or fever.  10.  Insulin Detemir 50 units subcu at bedtime.  11.  Ferrous sulfate 325 mg p.o. b.i.d.  12.  Provera 10 mg p.o. daily.  13.  Norco 5/325 mg 1 capsule p.o. q. 4 to 6 hours p.r.n. for pain.  14.  Flagyl 500 mg p.o. q. 8 hours for 9 days.  15.  Ciprofloxacin 500 mg p.o. b.i.d. for 9 days.  16.  Zofran 4 mg oral disintegrating tablet q. 6 hours p.r.n. for nausea/vomiting.   DISCHARGE DIET:  Low-sodium, ADA, 1800 diet. Advised to start eating light for the first few meals.   DISCHARGE ACTIVITY:  As tolerated.  FOLLOWUP INSTRUCTIONS:   PCP follow up In 1 to 2 weeks.   LABS AND IMAGING STUDIES PRIOR TO DISCHARGE: WBC 8.2, hemoglobin 12.0, hematocrit 35.8, platelet count 262.  Sodium 136, potassium 3.9, chloride 102, bicarb 31, BUN 5, creatinine 0.80, glucose 119, calcium of 8.4. Chlamydia and gonorrhea PCR is negative.   CT of the abdomen and pelvis showing diverticulosis with acute  sigmoid diverticulitis without abscess or perforation noted. Distal descending colon colitis is also observed.   ALT 18, AST 12, alk phos 110, total bili 0.5, albumin of 3.2. Urinalysis negative for any infection.   BRIEF HOSPITAL COURSE:  [name] is a 49 year old, obese African-American female with past medical history significant for diabetes, hypertension, COPD, obstructive sleep apnea, neuropathy. Comes to the hospital secondary to left lower quadrant abdominal pain associated with nausea and vomiting. She had a CT of the abdomen done, which showed diverticulitis, and because of her significant nausea, she was admitted for IV antibiotics.   1.  Acute sigmoid diverticulitis. Presenting with pain and nausea. Symptoms have improved, not completely resolved the next day. She was on IV fluids and IV Cipro and Flagyl, tolerating full liquid diet well, and patient wanted to have a regular diet. Her diet was advanced, and when she was able to tolerate a regular diet, she is being discharged home. She will be on Cipro and Flagyl for a total of 10 days.   All her other home medications were continued without any changes. Her course has been otherwise uneventful in the hospital.   DISCHARGE CONDITION:  Stable.   DISCHARGE DISPOSITION:  Home.   Time spent on discharge:  45 minutes.    ____________________________ Gladstone Lighter, MD rk:mr D: 07/10/2013 14:35:42 ET T: 07/10/2013 20:26:11 ET JOB#: [id number]  cc: Gladstone Lighter, MD, <Dictator> Gladstone Lighter MD ELECTRONICALLY SIGNED 08/03/2013 15:44

## 2015-03-14 NOTE — Op Note (Signed)
PATIENT NAME:  Brenda Hicks, Brenda Hicks MR#:  [id number] DATE OF BIRTH:  11-14-1966  DATE OF PROCEDURE:  12/26/2012  PREOPERATIVE DIAGNOSIS:  Right knee osteoarthritis.   POSTOPERATIVE DIAGNOSIS:  Right knee osteoarthritis.   PROCEDURE:  Right total knee arthroplasty.   ANESTHESIA:  Spinal.   SURGEON:  Thornton Park, MD   ASSISTANT:  Earnestine Leys, MD  ESTIMATED BLOOD LOSS:  20 mL.   TOURNIQUET TIME:  120 minutes.   URINE OUTPUT:  400 mL.   IMPLANTS:  DePuy Sigma femoral posterior stabilized right cemented component size 2.5, DePuy rotating platform size 3 tibial tray, cemented, tibial insert rotating platform 12.5 mm thickness size 2.5 as well as a 35 mm 3-peg oval dome patella.   INDICATIONS FOR PROCEDURE:  The patient is a morbidly obese 49 year old female who has severe advanced tricompartmental right knee osteoarthritis. This is confirmed on x-ray. On her x-rays, the patient has bone-on-bone contact, subchondral sclerosis and subchondral cyst formation along with osteophytes in all 3 compartments. The patient's pain is affecting her activities of daily living and her ability to ambulate. The patient wished to proceed with a total knee arthroplasty. I saw the patient in my office prior to the date of surgery. I reviewed the details of surgery as well as the risks and benefits of surgery. She understands the risks including infection requiring the removal of components, nerve or blood vessel injury, bleeding requiring blood transfusion, fracture, dislocation, persistent pain or instability, loosening of the components, hardware failure, knee stiffness requiring reoperation or manipulation and the need for further surgery including a revision total knee arthroplasty. The medical risks include DVT and pulmonary embolism, myocardial infarction, stroke, pneumonia, respiratory failure and death. The patient understood the risks associated with surgery and wished to proceed.   DESCRIPTION OF  PROCEDURE:  I met with the patient in the preoperative area. I signed the word yes over the right knee within the operative field. The patient was then brought to the operating room where she was placed supine on the operative table. All bony prominences were adequately padded. The anesthesia service had performed a spinal anesthetic. The patient's right knee was prepped and draped in a sterile fashion. On the operative table, she can flex to approximately 95 degrees and she lacked approximately 5 degrees of full extension.   A timeout was performed to verify the patient's name, date of birth, medical record number, correct site of surgery and correct procedure to be performed. A timeout was also used to verify the patient had received antibiotics and that all appropriate instruments, implants and radiographic studies were available in the room. Once all in attendance were in agreement, the case began and 3 grams of cefazolin was used for this patient.   The patient had a midline incision made with a #10 blade. The proposed incision had been previously drawn out with a surgical marker based upon bony landmarks. Full thickness flaps both medially and laterally was performed. Then, a medial arthrotomy was created with a deep #10 blade. The patella was everted and the right knee was flexed to approximately 90 degrees. The medial and lateral menisci as well as cruciate ligaments were removed. A rongeur was used to create a starting point for the intramedullary drill. The intramedullary drill was drilled within the trochlea just medial to the midline and above the intercondylar notch. This allowed for the placement of the intramedullary guide which was advanced into the femoral canal. The distal femoral cutting guide was set to remove  10 mm of distal femur. This was pinned into place. The intramedullary guide was then removed. An oscillating saw was used to remove the distal 10 mm. The cutting block was placed at 5  degrees in relation to the anatomic axis of the femur. Attention was then turned to the tibia.   The external tibial alignment of the external tibial cutting guide was applied to the anterior tibia. The slope of the guide matched the angle of the anterior tibial cortex. This was pinned into position removing approximately 8 mm from the high side. The Hohmann retractors were placed both medially and laterally to avoid injury to the soft tissues. An oscillating saw was then used to perform the proximal tibial cuts. The cutting guide was then removed. A straight osteotome was used to lift the cut portion of the tibia and this was removed with a rongeur. The leg was then brought into extension. The ligaments were balanced using a 10 mm spacer block. A soft tissue tibial release was performed to allow for appropriate tissue balancing. Next, the knee was flexed back up to approximately 110 degrees. The posterior referencing distal tibial guide was then applied. The femur was measured to be 2.5. The tibial sizing guide was then pinned into position. This was then removed and a 2.5 mm distal femoral cutting guide was applied to the distal femur. A 2.5 distal femoral cutting guide was pinned in position. An oscillating saw was used to make the anterior, posterior and chamfer cuts. After these cuts were created, the cutting guide was removed. A posterior stabilizing guide was then applied to the distal femur. The box cutting guide was pinned into position. An oscillating saw again was made to create the central box for posterior stabilization. Following this cut, the guide was removed. The flexion gap was measured and found to be symmetric. The leg was brought into full extension. Again, a spacer block was placed in the knee in full extension and alignment guide was used which showed a neutral alignment to the extremity. The knee again was copiously irrigated. The knee was flexed back to 100 degrees. The tibial plateau was  measured and found to be a well-covered with a size 3 tibial tray. This was pinned into position. The reaming guide was then attached to the tibial tray. The keel was reamed and then the flanges were created using a tibial central punch. The reaming guide was then removed along with the pins. The size 2.5 trial femur was applied with a 10 mm spacer block. A 12.5 mm tibial spacing block was also trialed and the knee was found to be more stable with a 12.5 mm tibial spacing component.   The knee was taken through a range of motion and felt to be stable. The varus and valgus stressing at 0 and 30 flexion found the knee to be stable as well. The knee was then brought to full extension. The patella was again everted. The side of the patella was measured and found to be 35 mm in diameter. The patella cutting guide was applied around the patella and an oscillating saw used to make the patella cut. The final thickness was approximately 14 mm. The 3-peg guide was placed on the undersurface of the patella and a drill used to create the peg holes for the patellar component. A 35 mm patellar component was applied. Again, the knee was taken through a full range of motion. The patella was stable and showed no evidence of maltracking.  All trial components were then removed. The wound was copiously irrigated with pulse lavage. The bony surfaces were carefully dried with suction and sponges. Methylmethacrylate which had been prepped on the back table was then applied to all bony surfaces. The tibial tray and posterior stabilized femoral component were applied. A 12.5 mm trial spacer was placed and the knee was passively brought into full extension. A patella 35 mm button was then placed into position and clamped. The methylmethacrylate was allowed to cure. Again, the knee joint was copiously irrigated with pulse lavage. All excess methylmethacrylate was removed using curettes and elevators.   After the methylmethacrylate had  cured, the trial 12.5 tibial tray was removed and the actual 12.5 mm tibial tray was inserted. Again, the knee was taken through a full range of motion and was stable. Range of motion on the table included 0 to 120 degrees of range of motion. An Autovac drain was placed over _____ of the knee. The medial arthrotomy was closed with an interrupted #1 Ethibond suture. The wound again was copiously irrigated with pulse lavage. The subcutaneous layers were closed in 2 layers with 0 Vicryl and 2-0 Vicryl. The skin was approximated with staples. A dry sterile dressing was applied along with a Polar Care _____ bed and brought to the PACU in stable condition.   I was scrubbed and present throughout the entire case and all sharp and instrument counts were correct at the conclusion of the case.    ____________________________ Timoteo Gaul, MD klk:si D: 01/03/2013 16:24:54 ET T: 01/03/2013 18:03:18 ET JOB#: [id number]  cc: Timoteo Gaul, MD, <Dictator> Timoteo Gaul MD ELECTRONICALLY SIGNED 01/15/2013 9:11

## 2015-03-14 NOTE — Discharge Summary (Signed)
PATIENT NAME:  Brenda Hicks, Brenda Hicks MR#:  [id number] DATE OF BIRTH:  [birth year]  DATE OF ADMISSION:  12/26/2012 DATE OF DISCHARGE:  12/29/2012  ADMITTING DIAGNOSIS:  Status post right total knee arthroplasty.   HISTORY: The patient is a 49 year old female who has had several years of right knee pain secondary to severe osteoarthritis. This was documented by x-ray. The patient has failed all conservative management including nonsteroidal anti-inflammatories and corticosteroid injections. The patient elected to undergo a right total knee arthroplasty for pain given that her symptoms were limiting her activities of daily living.   The patient underwent an uncomplicated right total knee arthroplasty on 12/26/2012, and was admitted to the orthopedic floor postoperatively. The patient had a Foley catheter in place and was continued on postop antibiotics for 24 hours. She was on Lovenox for DVT prophylaxis as  well. Given her baseline weight of 151 kg, she was placed on 40 mg b.i.d. per the pharmacy. The patient also had a medical consult called for assistance with management of her diabetes in the postop period. The patient orthopedically did well postop.   On postop check immediately following surgery she had clean dressings. She did complain of pain and was increased on her narcotic medication. She had a Polar Care and TENS unit as well. She was placed back on her Neurontin, which she takes at baseline, and was in a knee immobilizer.   On postoperative day #1 her Hemovac drain was removed. Her dressing remained clean, dry, and intact, and she remained neurovascularly intact as well.   On postoperative day #2 the patient continued to make progress. She was out of bed to a chair. Her pain was better controlled. She was able to ambulate into the hallway with physical therapy. She continued on Lovenox, TED stockings, and AVI's for DVT prophylaxis. The patient was found to have a UTI upon removal of the Foley  catheter and was started on p.o. Cipro. Her hemoglobin A1c was 10, and her medications were adjusted by the hospitalist.   By postoperative day #3 the patient was getting around well with physical therapy. She was cleared by the medical service for discharge as well. Her blood sugars were improving.    Given the patient's clinical improvement, she was prepared for discharge. Her dressing had been  changed on postoperative day #2 and was continued to be monitored throughout her hospitalization. Her incision was clean, dry, and intact, without drainage. She remained neurovascularly intact in the lower extremities. She does have baseline diabetic neuropathy.   DISCHARGE INSTRUCTIONS: The patient was discharged home with instructions to continue to keep her incision clean, dry and intact. She will be set up with home health for physical therapy.   She will remain on Lovenox 40 mg b.i.d., and was discharged on oxycodone for pain control.   She will see me back in the office in 7 to 10 days. I explained this plan to the patient. She understood and agreed with this plan.   DISCHARGE DIAGNOSES:   1. Status post right total knee arthroplasty.  2. Uncontrolled diabetes.  3. Urinary tract infection.     ____________________________ Timoteo Gaul, MD klk:dm D: 01/15/2013 09:15:00 ET T: 01/15/2013 10:21:26 ET JOB#: [id number]  cc: Timoteo Gaul, MD, <Dictator> Timoteo Gaul MD ELECTRONICALLY SIGNED 01/15/2013 23:24

## 2015-03-14 NOTE — Consult Note (Signed)
Brief Consult Note: Diagnosis: 1. Diabetes 2. HTN 3. diabetic Neuropathy 4. Hyperlipdemia 5. GERD 6. Sore throat 7. COPD w/ongoing tobacco abuse.   Patient was seen by consultant.   Consult note dictated.   Orders entered.   Comments: 49 yo female w/ hx of DM, HTN, hyperlipidemia, GERD, COPD w/ ongoing tobacco abuse, OSA, osteoarthritis came into hospital for a total right knee replacement due to OA.    1. Uncontrolled DM - likely related to non-compliance.  - was on Levemir but discontinued it herself.  Will restart low dose Levemir.  - cont. Metformin, Januvia and SSI and follow sugars.  Check HemoglobinA1c.   2. HTN - b.p. slightly up. Will increase Norvasc, cont. Benazapril  3. Diabetic Neuropathy - cont. Neurontin 4. COPD - no acute exacerbation. - cont. albuterol inhaler.  5. Sore Throat - will start on Cholaseptic spray, cepacol.  6. Hyperlipidemia - cont. Simvastatin 7. s/p right knee replacement POD # 1 - cont. care as per Ortho.  - cont. Lovenox for DVT prophylaxis.  cont. PT/OT and pain control as per Ortho  Thanks for the consult and will follow with you Job # [id number].  Electronic Signatures: Henreitta Leber (MD)  (Signed 05-Feb-14 14:32)  Authored: Brief Consult Note   Last Updated: 05-Feb-14 14:32 by Henreitta Leber (MD)

## 2015-05-08 IMAGING — CR DG WRIST COMPLETE 3+V*R*
1 series · 4 of 4 positions shown · non-contrast
Comparison: Right hand films 04/29/2010

CLINICAL DATA: Pain along the radial side of the right wrist.

EXAM:
RIGHT WRIST - COMPLETE 3+ VIEW

[Series 1: pa · 0.17mm/px · 4 of 4 slices shown]
[im 1/4]
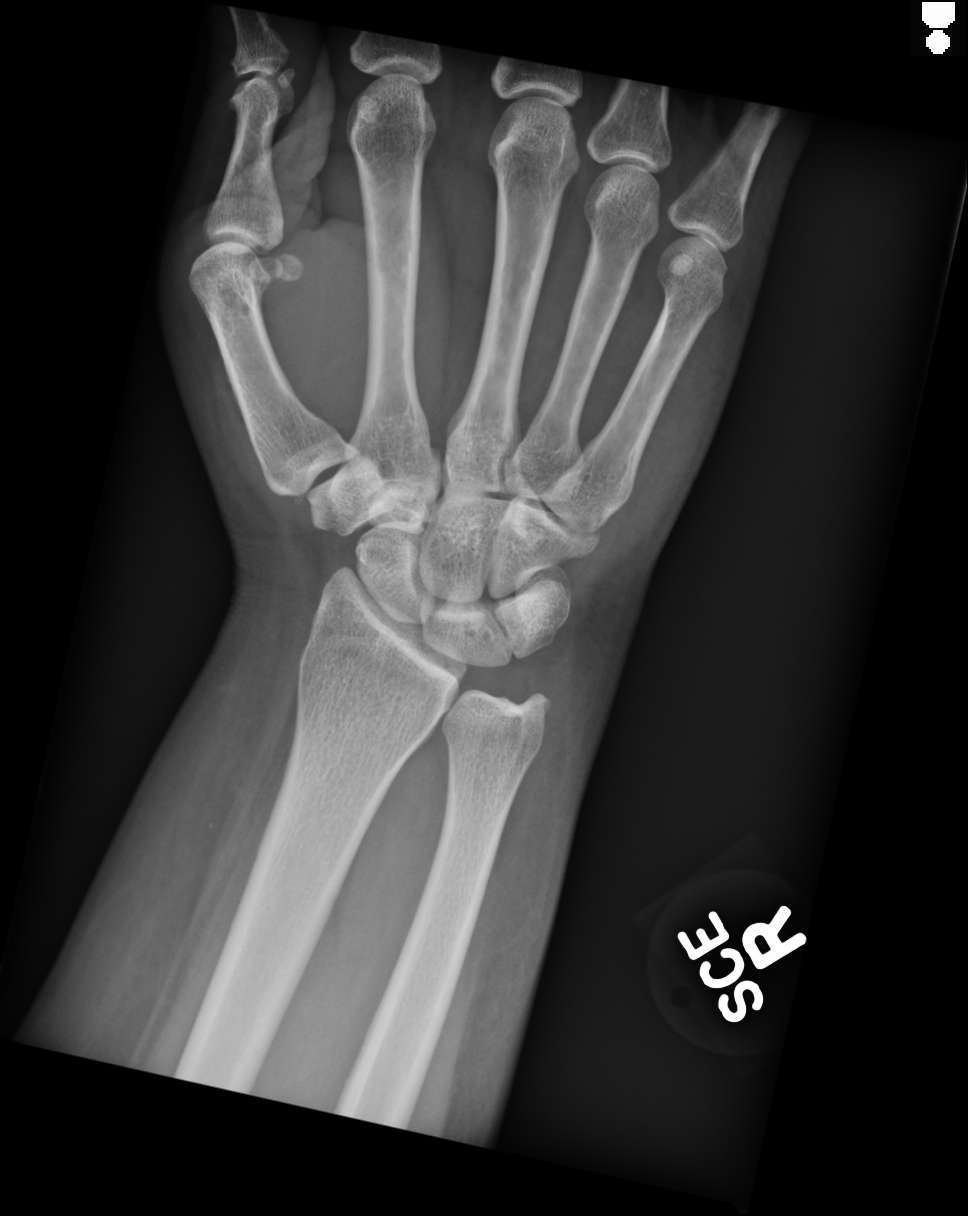
[im 2/4]
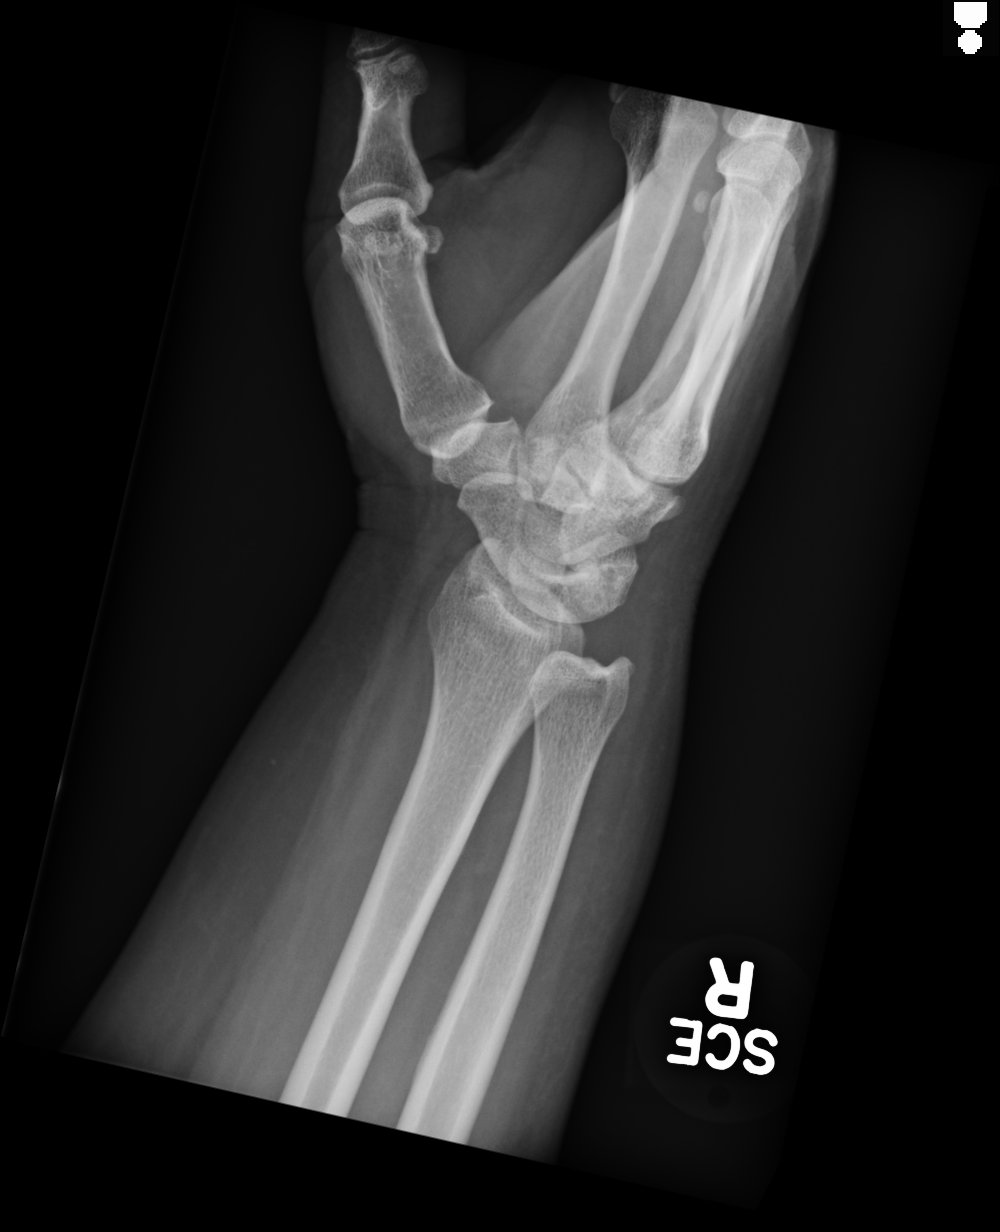
[im 3/4]
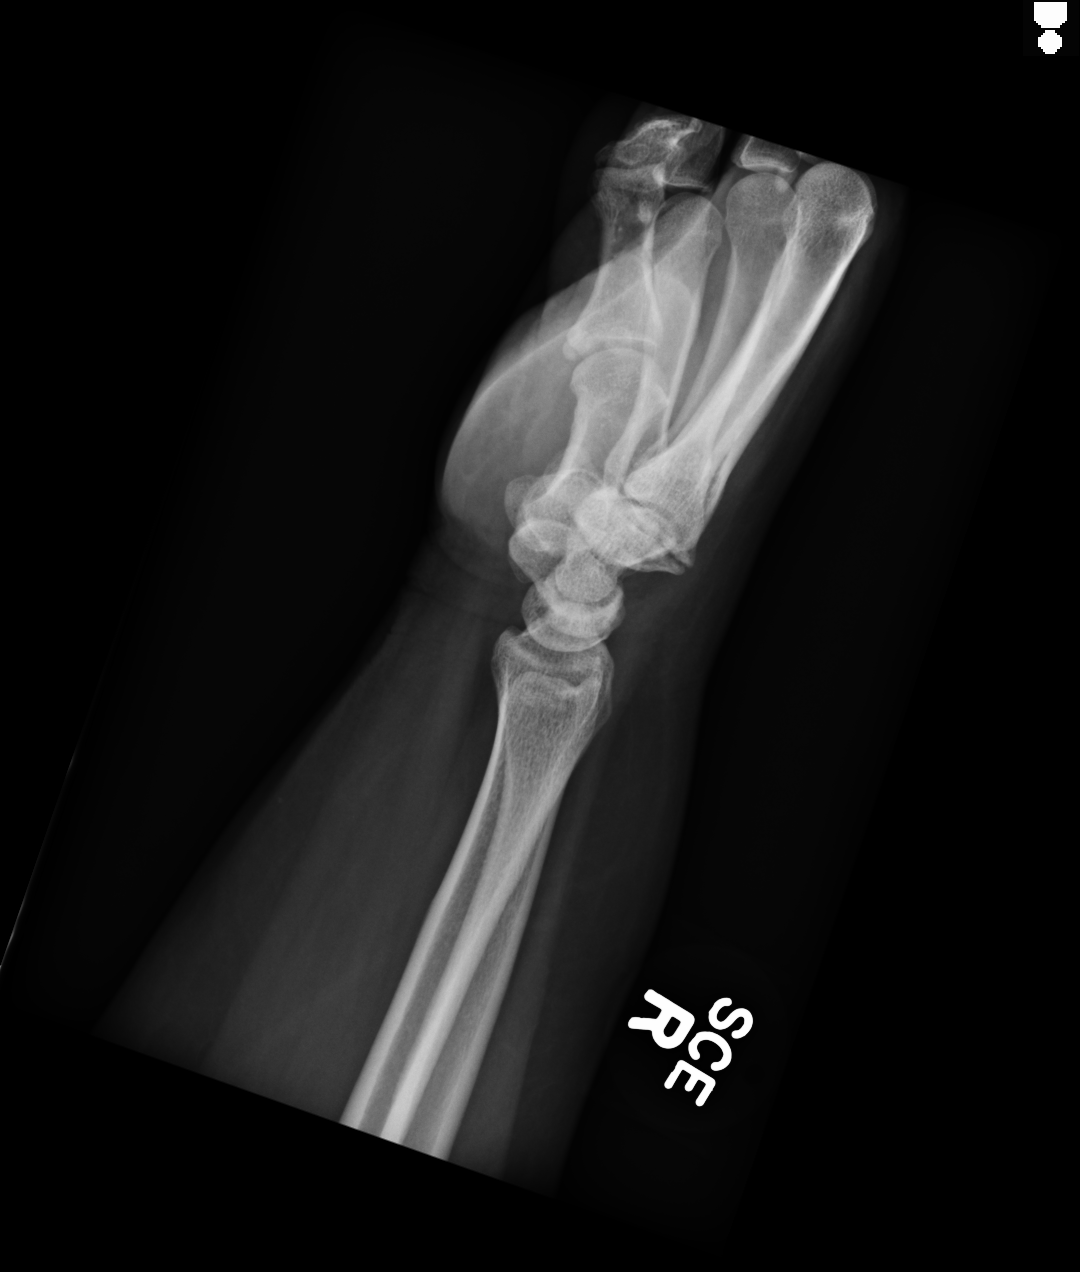
[im 4/4]
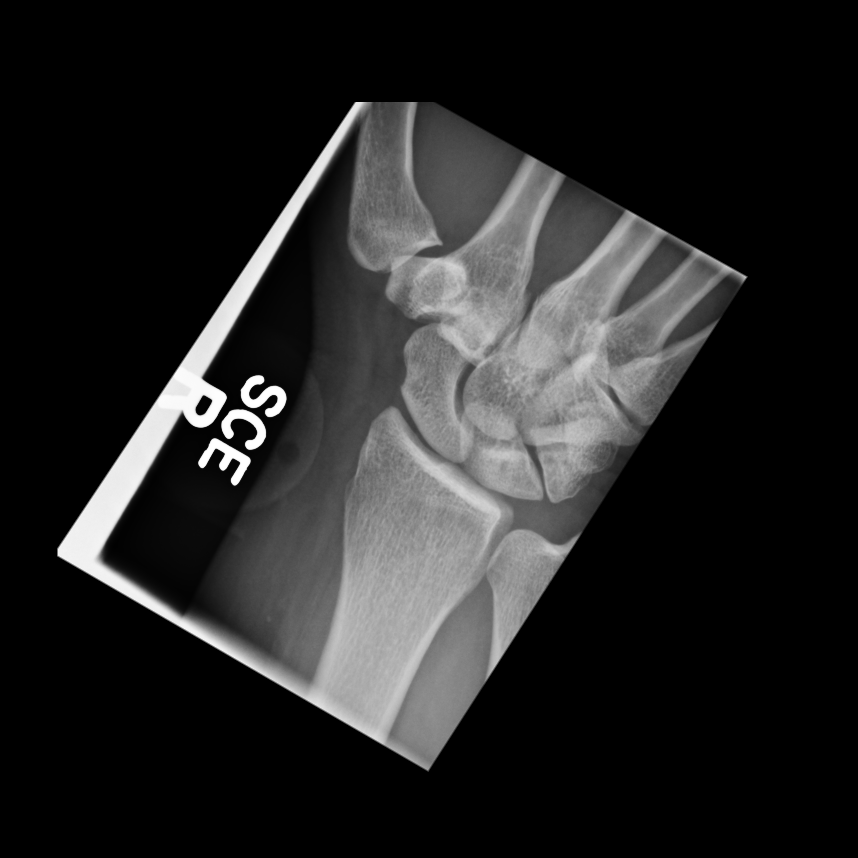

[4 of 4 positions shown; findings below may reference images not displayed]

FINDINGS: Degenerative changes are noted at the first CMC joint. No acute bone
or soft tissue abnormalities are present.
IMPRESSION: 1. No acute abnormality.
2. Degenerative changes at first CMC joint.

## 2015-06-16 ENCOUNTER — Emergency Department: Payer: Medicaid Other

## 2015-06-16 ENCOUNTER — Emergency Department
Admission: EM | Admit: 2015-06-16 | Discharge: 2015-06-16 | Disposition: A | Discharge status: Home / Self Care | Payer: Medicaid Other | Attending: Emergency Medicine | Admitting: Emergency Medicine

## 2015-06-16 ENCOUNTER — Encounter: Payer: Self-pay | Admitting: Emergency Medicine

## 2015-06-16 DIAGNOSIS — R296 Repeated falls: Secondary | ICD-10-CM | POA: Insufficient documentation

## 2015-06-16 DIAGNOSIS — G8929 Other chronic pain: Secondary | ICD-10-CM | POA: Diagnosis not present

## 2015-06-16 DIAGNOSIS — M25561 Pain in right knee: Secondary | ICD-10-CM | POA: Diagnosis present

## 2015-06-16 DIAGNOSIS — Z720 Tobacco use: Secondary | ICD-10-CM | POA: Diagnosis not present

## 2015-06-16 DIAGNOSIS — I10 Essential (primary) hypertension: Secondary | ICD-10-CM | POA: Insufficient documentation

## 2015-06-16 DIAGNOSIS — E119 Type 2 diabetes mellitus without complications: Secondary | ICD-10-CM | POA: Insufficient documentation

## 2015-06-16 DIAGNOSIS — M25562 Pain in left knee: Secondary | ICD-10-CM | POA: Insufficient documentation

## 2015-06-16 HISTORY — DX: Essential (primary) hypertension: I10

## 2015-06-16 HISTORY — DX: Type 2 diabetes mellitus without complications: E11.9

## 2015-06-16 HISTORY — DX: Unspecified asthma, uncomplicated: J45.909

## 2015-06-16 MED ORDER — OXYCODONE-ACETAMINOPHEN 5-325 MG PO TABS: 1.0000 | ORAL_TABLET | Freq: Three times a day (TID) | ORAL | Status: DC | PRN

## 2015-06-16 MED ORDER — IBUPROFEN 800 MG PO TABS: 800.0000 mg | ORAL_TABLET | Freq: Three times a day (TID) | ORAL | Status: DC | PRN

## 2015-06-16 NOTE — Discharge Instructions (Signed)
Take medication as prescribed. Alternate heat and ice for comfort. Rest. Avoid overly strenuous activity. Use walker and knee immobilizer for support.  Follow-up with orthopedics next week as discussed. See above to call today to schedule. Return to the ER for new or worsening concerns.  Knee Pain The knee is the complex joint between your thigh and your lower leg. It is made up of bones, tendons, ligaments, and cartilage. The bones that make up the knee are:  The femur in the thigh.  The tibia and fibula in the lower leg.  The patella or kneecap riding in the groove on the lower femur. CAUSES  Knee pain is a common complaint with many causes. A few of these causes are:  Injury, such as:  A ruptured ligament or tendon injury.  Torn cartilage.  Medical conditions, such as:  Gout  Arthritis  Infections  Overuse, over training, or overdoing a physical activity. Knee pain can be minor or severe. Knee pain can accompany debilitating injury. Minor knee problems often respond well to self-care measures or get well on their own. More serious injuries may need medical intervention or even surgery. SYMPTOMS The knee is complex. Symptoms of knee problems can vary widely. Some of the problems are:  Pain with movement and weight bearing.  Swelling and tenderness.  Buckling of the knee.  Inability to straighten or extend your knee.  Your knee locks and you cannot straighten it.  Warmth and redness with pain and fever.  Deformity or dislocation of the kneecap. DIAGNOSIS  Determining what is wrong may be very straight forward such as when there is an injury. It can also be challenging because of the complexity of the knee. Tests to make a diagnosis may include:  Your caregiver taking a history and doing a physical exam.  Routine X-rays can be used to rule out other problems. X-rays will not reveal a cartilage tear. Some injuries of the knee can be diagnosed by:  Arthroscopy a  surgical technique by which a small video camera is inserted through tiny incisions on the sides of the knee. This procedure is used to examine and repair internal knee joint problems. Tiny instruments can be used during arthroscopy to repair the torn knee cartilage (meniscus).  Arthrography is a radiology technique. A contrast liquid is directly injected into the knee joint. Internal structures of the knee joint then become visible on X-ray film.  An MRI scan is a non X-ray radiology procedure in which magnetic fields and a computer produce two- or three-dimensional images of the inside of the knee. Cartilage tears are often visible using an MRI scanner. MRI scans have largely replaced arthrography in diagnosing cartilage tears of the knee.  Blood work.  Examination of the fluid that helps to lubricate the knee joint (synovial fluid). This is done by taking a sample out using a needle and a syringe. TREATMENT The treatment of knee problems depends on the cause. Some of these treatments are:  Depending on the injury, proper casting, splinting, surgery, or physical therapy care will be needed.  Give yourself adequate recovery time. Do not overuse your joints. If you begin to get sore during workout routines, back off. Slow down or do fewer repetitions.  For repetitive activities such as cycling or running, maintain your strength and nutrition.  Alternate muscle groups. For example, if you are a weight lifter, work the upper body on one day and the lower body the next.  Either tight or weak muscles do  not give the proper support for your knee. Tight or weak muscles do not absorb the stress placed on the knee joint. Keep the muscles surrounding the knee strong.  Take care of mechanical problems.  If you have flat feet, orthotics or special shoes may help. See your caregiver if you need help.  Arch supports, sometimes with wedges on the inner or outer aspect of the heel, can help. These can  shift pressure away from the side of the knee most bothered by osteoarthritis.  A brace called an "unloader" brace also may be used to help ease the pressure on the most arthritic side of the knee.  If your caregiver has prescribed crutches, braces, wraps or ice, use as directed. The acronym for this is PRICE. This means protection, rest, ice, compression, and elevation.  Nonsteroidal anti-inflammatory drugs (NSAIDs), can help relieve pain. But if taken immediately after an injury, they may actually increase swelling. Take NSAIDs with food in your stomach. Stop them if you develop stomach problems. Do not take these if you have a history of ulcers, stomach pain, or bleeding from the bowel. Do not take without your caregiver's approval if you have problems with fluid retention, heart failure, or kidney problems.  For ongoing knee problems, physical therapy may be helpful.  Glucosamine and chondroitin are over-the-counter dietary supplements. Both may help relieve the pain of osteoarthritis in the knee. These medicines are different from the usual anti-inflammatory drugs. Glucosamine may decrease the rate of cartilage destruction.  Injections of a corticosteroid drug into your knee joint may help reduce the symptoms of an arthritis flare-up. They may provide pain relief that lasts a few months. You may have to wait a few months between injections. The injections do have a small increased risk of infection, water retention, and elevated blood sugar levels.  Hyaluronic acid injected into damaged joints may ease pain and provide lubrication. These injections may work by reducing inflammation. A series of shots may give relief for as long as 6 months.  Topical painkillers. Applying certain ointments to your skin may help relieve the pain and stiffness of osteoarthritis. Ask your pharmacist for suggestions. Many over the-counter products are approved for temporary relief of arthritis pain.  In some  countries, doctors often prescribe topical NSAIDs for relief of chronic conditions such as arthritis and tendinitis. A review of treatment with NSAID creams found that they worked as well as oral medications but without the serious side effects. PREVENTION  Maintain a healthy weight. Extra pounds put more strain on your joints.  Get strong, stay limber. Weak muscles are a common cause of knee injuries. Stretching is important. Include flexibility exercises in your workouts.  Be smart about exercise. If you have osteoarthritis, chronic knee pain or recurring injuries, you may need to change the way you exercise. This does not mean you have to stop being active. If your knees ache after jogging or playing basketball, consider switching to swimming, water aerobics, or other low-impact activities, at least for a few days a week. Sometimes limiting high-impact activities will provide relief.  Make sure your shoes fit well. Choose footwear that is right for your sport.  Protect your knees. Use the proper gear for knee-sensitive activities. Use kneepads when playing volleyball or laying carpet. Buckle your seat belt every time you drive. Most shattered kneecaps occur in car accidents.  Rest when you are tired. SEEK MEDICAL CARE IF:  You have knee pain that is continual and does not seem to  be getting better.  SEEK IMMEDIATE MEDICAL CARE IF:  Your knee joint feels hot to the touch and you have a high fever. MAKE SURE YOU:   Understand these instructions.  Will watch your condition.  Will get help right away if you are not doing well or get worse. Document Released: 09/05/2007 Document Revised: 01/31/2012 Document Reviewed: 09/05/2007 [facility] Patient Information 2015 [place], [place]. This information is not intended to replace advice given to you by your health care provider. Make sure you discuss any questions you have with your health care provider.  Arthritis, Nonspecific Arthritis is  inflammation of a joint. This usually means pain, redness, warmth or swelling are present. One or more joints may be involved. There are a number of types of arthritis. Your caregiver may not be able to tell what type of arthritis you have right away. CAUSES  The most common cause of arthritis is the wear and tear on the joint (osteoarthritis). This causes damage to the cartilage, which can break down over time. The knees, hips, back and neck are most often affected by this type of arthritis. Other types of arthritis and common causes of joint pain include:  Sprains and other injuries near the joint. Sometimes minor sprains and injuries cause pain and swelling that develop hours later.  Rheumatoid arthritis. This affects hands, feet and knees. It usually affects both sides of your body at the same time. It is often associated with chronic ailments, fever, weight loss and general weakness.  Crystal arthritis. Gout and pseudo gout can cause occasional acute severe pain, redness and swelling in the foot, ankle, or knee.  Infectious arthritis. Bacteria can get into a joint through a break in overlying skin. This can cause infection of the joint. Bacteria and viruses can also spread through the blood and affect your joints.  Drug, infectious and allergy reactions. Sometimes joints can become mildly painful and slightly swollen with these types of illnesses. SYMPTOMS   Pain is the main symptom.  Your joint or joints can also be red, swollen and warm or hot to the touch.  You may have a fever with certain types of arthritis, or even feel overall ill.  The joint with arthritis will hurt with movement. Stiffness is present with some types of arthritis. DIAGNOSIS  Your caregiver will suspect arthritis based on your description of your symptoms and on your exam. Testing may be needed to find the type of arthritis:  Blood and sometimes urine tests.  X-ray tests and sometimes CT or MRI  scans.  Removal of fluid from the joint (arthrocentesis) is done to check for bacteria, crystals or other causes. Your caregiver (or a specialist) will numb the area over the joint with a local anesthetic, and use a needle to remove joint fluid for examination. This procedure is only minimally uncomfortable.  Even with these tests, your caregiver may not be able to tell what kind of arthritis you have. Consultation with a specialist (rheumatologist) may be helpful. TREATMENT  Your caregiver will discuss with you treatment specific to your type of arthritis. If the specific type cannot be determined, then the following general recommendations may apply. Treatment of severe joint pain includes:  Rest.  Elevation.  Anti-inflammatory medication (for example, ibuprofen) may be prescribed. Avoiding activities that cause increased pain.  Only take over-the-counter or prescription medicines for pain and discomfort as recommended by your caregiver.  Cold packs over an inflamed joint may be used for 10 to 15 minutes every hour.  Hot packs sometimes feel better, but do not use overnight. Do not use hot packs if you are diabetic without your caregiver's permission.  A cortisone shot into arthritic joints may help reduce pain and swelling.  Any acute arthritis that gets worse over the next 1 to 2 days needs to be looked at to be sure there is no joint infection. Long-term arthritis treatment involves modifying activities and lifestyle to reduce joint stress jarring. This can include weight loss. Also, exercise is needed to nourish the joint cartilage and remove waste. This helps keep the muscles around the joint strong. HOME CARE INSTRUCTIONS   Do not take aspirin to relieve pain if gout is suspected. This elevates uric acid levels.  Only take over-the-counter or prescription medicines for pain, discomfort or fever as directed by your caregiver.  Rest the joint as much as possible.  If your joint is  swollen, keep it elevated.  Use crutches if the painful joint is in your leg.  Drinking plenty of fluids may help for certain types of arthritis.  Follow your caregiver's dietary instructions.  Try low-impact exercise such as:  Swimming.  Water aerobics.  Biking.  Walking.  Morning stiffness is often relieved by a warm shower.  Put your joints through regular range-of-motion. SEEK MEDICAL CARE IF:   You do not feel better in 24 hours or are getting worse.  You have side effects to medications, or are not getting better with treatment. SEEK IMMEDIATE MEDICAL CARE IF:   You have a fever.  You develop severe joint pain, swelling or redness.  Many joints are involved and become painful and swollen.  There is severe back pain and/or leg weakness.  You have loss of bowel or bladder control. Document Released: 12/16/2004 Document Revised: 01/31/2012 Document Reviewed: 01/01/2009 [facility] Patient Information 2015 [place], [place]. This information is not intended to replace advice given to you by your health care provider. Make sure you discuss any questions you have with your health care provider.

## 2015-06-16 NOTE — ED Provider Notes (Signed)
[facility] Emergency Department Provider Note  ____________________________________________  Time seen: Approximately 2:10 PM  I have reviewed the triage vital signs and the nursing notes.   HISTORY  Chief Complaint Knee Pain    HPI Brenda Hicks is a 49 y.o. female presents to ER for the complaints of bilateral knee pain. Patient reports that she has chronic bilateral knee pain. Patient reports that she has had a total right knee replacement and reports that she has had intermittent pain for the last 2 years with that knee. Patient reports that her left knee times several years have been increasing in pain. Patient reports that her left knee occasionally "gives out" on her causing her knee to buckle and then she occasionally falls. States has been giving out and causing falls x years.  Patient reports a few days ago she was walking and her left knee buckled causing her to fall onto her left and right knee. Patient reports having increased pain since then. Patient reports that she was told at [facility] that her right knee replacement has loosening in it. States left knee pain worse.   Patient denies any recent sickness. Denies head injury or LOC. Denies weakness, chest pain, shortness of breath, abdominal pain, dizziness, headache, neck or back pain. Patient reports only knee pain. Patient reports that bilateral knee pain is currently 6-7 out of 10. States pain is aching throbbing pain. Patient states that pain is minimal at rest and pain is primarily with movement. Patient states that current pain is similar to her previous pain and previous flareup of her pain.   Past Medical History  Diagnosis Date  . Diabetes mellitus without complication   . Hypertension   . Asthma   Chronic knee pain  Ortho: [place]  There are no active problems to display for this patient.   Past Surgical History  Procedure Laterality Date  . Knee surgery Right     No current  outpatient prescriptions on file.  Allergies Review of patient's allergies indicates no known allergies.  History reviewed. No pertinent family history.  Social History History  Substance Use Topics  . Smoking status: Current Every Day Smoker  . Smokeless tobacco: Not on file  . Alcohol Use: No    Review of Systems Constitutional: No fever/chills Eyes: No visual changes. ENT: No sore throat. Cardiovascular: Denies chest pain. Respiratory: Denies shortness of breath. Gastrointestinal: No abdominal pain.  No nausea, no vomiting.  No diarrhea.  No constipation. Genitourinary: Negative for dysuria. Musculoskeletal: Negative for back pain.bilateral knee pain.  Skin: Negative for rash. Neurological: Negative for headaches, focal weakness or numbness.  10-point ROS otherwise negative.  ____________________________________________   PHYSICAL EXAM:  VITAL SIGNS: ED Triage Vitals  Enc Vitals Group     BP 06/16/15 1215 150/92 mmHg     Pulse Rate 06/16/15 1215 91     Resp 06/16/15 1215 20     Temp 06/16/15 1215 98.5 F (36.9 C)     Temp Source 06/16/15 1215 Oral     SpO2 06/16/15 1215 98 %     Weight 06/16/15 1215 334 lb (151.501 kg)     Height 06/16/15 1215 5\' 5"  (1.651 m)     Head Cir --      Peak Flow --      Pain Score 06/16/15 1216 9     Pain Loc --      Pain Edu? --      Excl. in [place]? --  Constitutional: Alert and oriented. Well appearing and in no acute distress. Eyes: Conjunctivae are normal. PERRL. EOMI. Head: Atraumatic.  Nose: No congestion/rhinnorhea.  Mouth/Throat: Mucous membranes are moist.  Oropharynx non-erythematous. Neck: No stridor.  No cervical spine tenderness to palpation. Hematological/Lymphatic/Immunilogical: No cervical lymphadenopathy. Cardiovascular: Normal rate, regular rhythm. Grossly normal heart sounds.  Good peripheral circulation. Respiratory: Normal respiratory effort.  No retractions. Lungs CTAB. Gastrointestinal: Soft and  nontender. No distention. Normal Bowel sounds.  No abdominal bruits. No CVA tenderness. Musculoskeletal: No lower or upper extremity tenderness nor edema.  No joint effusions. Bilateral pedal pulses equal and easily palpated. No cervical, or as your lumbar tenderness to palpation. Ambulatory in hallway with steady gait. Right Knee: mild TTP anterior knee, full ROM. Stable. Pain with lateral and medial stress, no pain with anterior or posterior drawer test. No swelling, erythema, induration or drainage. Skin intact.Right calf nontender. Left knee: Moderate tender to palpation lateral anterior knee. No swelling, ecchymosis, induration or drainage. Skin intact. Pain present with lateral and medial stress. Minimal pain with anterior drawer test. No pain with posterior drawer test. Left calf nontender.  Neurologic:  Normal speech and language. No gross focal neurologic deficits are appreciated. No gait instability. Skin:  Skin is warm, dry and intact. No rash noted. Psychiatric: Mood and affect are normal. Speech and behavior are normal.  ____________________________________________   LABS (all labs ordered are listed, but only abnormal results are displayed)  Labs Reviewed - No data to display ____________________________________________  RADIOLOGY LEFT KNEE - COMPLETE 4+ VIEW  COMPARISON: None.  FINDINGS: There is no evidence of fracture, dislocation, or joint effusion. There is no evidence of arthropathy or other focal bone abnormality. Soft tissues are unremarkable. Minimal tricompartmental degenerative change.  IMPRESSION: Negative.   Electronically Signed By: Conchita Paris M.D. On: 06/16/2015 15:09          DG Knee Complete 4 Views Right (Final result) Result time: 06/16/15 15:05:21   Final result by Rad Results In Interface (06/16/15 15:05:21)   Narrative:   CLINICAL DATA: Posterior knee pain. Injury. Bilateral knee pain for 1 year. Recent  falls.  EXAM: RIGHT KNEE - COMPLETE 4+ VIEW  COMPARISON: Radiograph 12/26/2012.  FINDINGS: RIGHT 3 part total knee arthroplasty. No effusion. The alignment of the knee is anatomic. Small fragments of heterotopic bone are present around the posterior lateral compartment. There is lucency along the medial aspect of the tibial tray suggesting early loosening. Differential considerations are aseptic mechanical loosening, indolent infection or particle disease.  IMPRESSION: No acute abnormality. 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   Electronically Signed By: Dereck Ligas M.D. On: 06/16/2015 15:05   I, Marylene Land, personally viewed and evaluated these images as part of my medical decision making.    ____________________________________________   PROCEDURES  Procedure(s) performed:  SPLINT APPLICATION Date/Time: [id number] Authorized by: Marylene Land Consent: Verbal consent obtained. Risks and benefits: risks, benefits and alternatives were discussed Consent given by: patient Splint applied by: edtechnician Location details: left knee ACE (immbolizer unable to fit) and walker  splinted body part was neurovascularly unchanged following the procedure. Patient tolerance: Patient tolerated the procedure well with no immediate complications.   _______________________________________   INITIAL IMPRESSION / ASSESSMENT AND PLAN / ED COURSE  Pertinent labs & imaging results that were available during my care of the patient were reviewed by me and considered in my medical decision making (see chart for details).  No acute distress. Very well-appearing patient.  Ambulatory in ER with steady gait. Presents to ER for complaints of bilateral knee pain. Patient reports recently left knee buckling and causing her to fall on bilateral knees. Patient reports chronic history of same. Left knee x-ray negative for acute changes,  minimal tricompartmental degenerative change. Right knee no acute abnormality, 3 part total knee arthroplasty with early loosening along the medial tibial tray. Patient with acute on chronic bilateral knee pain.  Discussed x-rays with patient. Patient to follow-up with orthopedic this week.  Patient reports her orthopedics is Dr. Mack Guise. Will place patient in left knee ACE for support as well as discharge with a walker. Discussed strict follow-up and return parameters. We'll treat patient with when necessary Percocet and ibuprofen. Follow up with orthopedics. Return to the ER for new or worsening concerns. ____________________________________________   FINAL CLINICAL IMPRESSION(S) / ED DIAGNOSES  Final diagnoses:  Knee pain, acute, left  Knee pain, acute, right  Left knee arthritis pain      Marylene Land, NP 06/16/15 [place], MD 06/17/15 [id number]

## 2015-06-16 NOTE — ED Notes (Signed)
Pt to ed with c/o bilat knee pain x 1 year.  Pt states has recently fallen x 2.

## 2015-06-16 NOTE — ED Notes (Signed)
Computer in room crashed just prior to pt signature and would not reboot.  Pt verbalizes understanding of discharge instructions and medications.  Pt escorted to lobby.

## 2015-07-25 DIAGNOSIS — M179 Osteoarthritis of knee, unspecified: Secondary | ICD-10-CM | POA: Insufficient documentation

## 2015-07-25 DIAGNOSIS — M1710 Unilateral primary osteoarthritis, unspecified knee: Secondary | ICD-10-CM | POA: Insufficient documentation

## 2015-07-25 DIAGNOSIS — Z9660 Presence of unspecified orthopedic joint implant: Secondary | ICD-10-CM | POA: Insufficient documentation

## 2015-09-05 ENCOUNTER — Emergency Department
Admission: EM | Admit: 2015-09-05 | Discharge: 2015-09-05 | Disposition: A | Discharge status: Home / Self Care | Payer: Medicaid Other | Attending: Emergency Medicine | Admitting: Emergency Medicine

## 2015-09-05 ENCOUNTER — Encounter: Payer: Self-pay | Admitting: *Deleted

## 2015-09-05 DIAGNOSIS — E119 Type 2 diabetes mellitus without complications: Secondary | ICD-10-CM | POA: Diagnosis not present

## 2015-09-05 DIAGNOSIS — I10 Essential (primary) hypertension: Secondary | ICD-10-CM | POA: Diagnosis not present

## 2015-09-05 DIAGNOSIS — Z720 Tobacco use: Secondary | ICD-10-CM | POA: Insufficient documentation

## 2015-09-05 DIAGNOSIS — L02416 Cutaneous abscess of left lower limb: Secondary | ICD-10-CM

## 2015-09-05 MED ORDER — OXYCODONE-ACETAMINOPHEN 5-325 MG PO TABS: 1.0000 | ORAL_TABLET | ORAL | Status: DC | PRN

## 2015-09-05 MED ORDER — OXYCODONE-ACETAMINOPHEN 5-325 MG PO TABS
2.0000 | ORAL_TABLET | Freq: Once | ORAL | Status: AC
  Administered 2015-09-05: 2 via ORAL
  Filled 2015-09-05: qty 2

## 2015-09-05 MED ORDER — SULFAMETHOXAZOLE-TRIMETHOPRIM 800-160 MG PO TABS: 1.0000 | ORAL_TABLET | Freq: Two times a day (BID) | ORAL | Status: DC

## 2015-09-05 NOTE — Discharge Instructions (Signed)
Abscess An abscess (boil or furuncle) is an infected area on or under the skin. This area is filled with yellowish-white fluid (pus) and other material (debris). HOME CARE   Only take medicines as told by your doctor.  If you were given antibiotic medicine, take it as directed. Finish the medicine even if you start to feel better.  If gauze is used, follow your doctor's directions for changing the gauze.  To avoid spreading the infection:  Keep your abscess covered with a bandage.  Wash your hands well.  Do not share personal care items, towels, or whirlpools with others.  Avoid skin contact with others.  Keep your skin and clothes clean around the abscess.  Keep all doctor visits as told. GET HELP RIGHT AWAY IF:   You have more pain, puffiness (swelling), or redness in the wound site.  You have more fluid or blood coming from the wound site.  You have muscle aches, chills, or you feel sick.  You have a fever. MAKE SURE YOU:   Understand these instructions.  Will watch your condition.  Will get help right away if you are not doing well or get worse.   This information is not intended to replace advice given to you by your health care provider. Make sure you discuss any questions you have with your health care provider.   Document Released: 04/26/2008 Document Revised: 05/09/2012 Document Reviewed: 01/22/2012 Elsevier Interactive Patient Education 2016 [place] as needed for pain. Bactrim DS for infection for the next 10 days. Use warm moist compresses to the area as we discussed. Return to the emergency room if any fever or worsening of her symptoms. Follow-up with your primary care doctor as needed.

## 2015-09-05 NOTE — ED Provider Notes (Signed)
[facility] Emergency Department Provider Note  ____________________________________________  Time seen: Approximately 1:58 PM  I have reviewed the triage vital signs and the nursing notes.   HISTORY  Chief Complaint Abscess  HPI Brenda Hicks is a 49 y.o. female chief complaint of abscess to her left thigh. Patient states that she was at [facility] primary today where a doctor squeezed on the abscess until he got a "great deal of stuff out of it" but sent her here to "have it lanced". Patient states she has had "boils" before and they've always cleared up without any difficulty. She states prior to arrival she has not had any medication for pain. Currently she is not taking any antibiotics. Patient states that the pain is constant and has increased since the provider at [facility] primary squeezed on her abscess. Currently she rates her pain a 10 out of 10.   Past Medical History  Diagnosis Date  . Diabetes mellitus without complication ([place])   . Hypertension   . Asthma     There are no active problems to display for this patient.   Past Surgical History  Procedure Laterality Date  . Knee surgery Right     Current Outpatient Rx  Name  Route  Sig  Dispense  Refill  . ibuprofen (ADVIL,MOTRIN) 800 MG tablet   Oral   Take 1 tablet (800 mg total) by mouth every 8 (eight) hours as needed for mild pain or moderate pain.   15 tablet   0   . oxyCODONE-acetaminophen (PERCOCET) 5-325 MG tablet   Oral   Take 1 tablet by mouth every 4 (four) hours as needed for severe pain.   20 tablet   0   . sulfamethoxazole-trimethoprim (BACTRIM DS,SEPTRA DS) 800-160 MG tablet   Oral   Take 1 tablet by mouth 2 (two) times daily.   20 tablet   0     Allergies Review of patient's allergies indicates no known allergies.  No family history on file.  Social History Social History  Substance Use Topics  . Smoking status: Current Every Day Smoker  . Smokeless tobacco: None   . Alcohol Use: No    Review of Systems Constitutional: No fever/chills Eyes: No visual changes. Cardiovascular: Denies chest pain. Respiratory: Denies shortness of breath. Gastrointestinal  No nausea, no vomiting.  Skin: Positive for abscess Neurological: Negative for headaches, focal weakness or numbness.  10-point ROS otherwise negative.  ____________________________________________   PHYSICAL EXAM:  VITAL SIGNS: ED Triage Vitals  Enc Vitals Group     BP 09/05/15 1227 140/96 mmHg     Pulse Rate 09/05/15 1227 92     Resp 09/05/15 1227 18     Temp 09/05/15 1227 98.2 F (36.8 C)     Temp Source 09/05/15 1227 Oral     SpO2 09/05/15 1227 94 %     Weight 09/05/15 1227 327 lb (148.326 kg)     Height 09/05/15 1227 5\' 7"  (1.702 m)     Head Cir --      Peak Flow --      Pain Score 09/05/15 1227 10     Pain Loc --      Pain Edu? --      Excl. in [place]? --     Constitutional: Alert and oriented. Well appearing and in no acute distress. Eyes: Conjunctivae are normal. PERRL. EOMI. Head: Atraumatic. Nose: No congestion/rhinnorhea. Neck: No stridor.   Cardiovascular: Normal rate, regular rhythm. Grossly normal heart sounds.  Good peripheral circulation. Respiratory: Normal respiratory effort.  No retractions. Lungs CTAB. Gastrointestinal: Soft and nontender. No distention.  Musculoskeletal: No lower extremity tenderness nor edema.  No joint effusions. Neurologic:  Normal speech and language. No gross focal neurologic deficits are appreciated. No gait instability. Skin:  Skin is warm, dry and intact. Left posterior inner thigh there is an erythematous area that is extremely tender to touch. There is an open wound with active drainage present.  There is no fluctuance in this area. Area surrounding this is fine to touch patient is extremely tender to light pressure. Psychiatric: Mood and affect are normal. Speech and behavior are  normal.  ____________________________________________   LABS (all labs ordered are listed, but only abnormal results are displayed)  Labs Reviewed - No data to display  PROCEDURES  Procedure(s) performed: None  Critical Care performed: No  ____________________________________________   INITIAL IMPRESSION / ASSESSMENT AND PLAN / ED COURSE  Pertinent labs & imaging results that were available during my care of the patient were reviewed by me and considered in my medical decision making (see chart for details).  Patient was given 2 Percocet 5/325 since she has a driver here. Patient was able to tolerate palpation of this area after pain medication. After discussion with patient it was decided that we will place her on Percocet for pain and Bactrim DS for infection. Patient is to continue using warm compresses to the area. She is to return to the emergency room if any severe worsening of her symptoms or fever. ____________________________________________   FINAL CLINICAL IMPRESSION(S) / ED DIAGNOSES  Final diagnoses:  Abscess of left thigh      Johnn Hai, PA-C 09/05/15 1503  Delman Kitten, MD 09/05/15 1504

## 2015-09-05 NOTE — ED Notes (Signed)
Pt reports sent from PCP due to large abscess on left thigh. States PCP tried to drain, but sent here to have it lanced.

## 2015-12-07 ENCOUNTER — Emergency Department
Admission: EM | Admit: 2015-12-07 | Discharge: 2015-12-07 | Disposition: A | Discharge status: Home / Self Care | Payer: Medicaid Other | Attending: Emergency Medicine | Admitting: Emergency Medicine

## 2015-12-07 ENCOUNTER — Encounter: Payer: Self-pay | Admitting: Emergency Medicine

## 2015-12-07 DIAGNOSIS — Z792 Long term (current) use of antibiotics: Secondary | ICD-10-CM | POA: Insufficient documentation

## 2015-12-07 DIAGNOSIS — I10 Essential (primary) hypertension: Secondary | ICD-10-CM | POA: Insufficient documentation

## 2015-12-07 DIAGNOSIS — J45909 Unspecified asthma, uncomplicated: Secondary | ICD-10-CM | POA: Insufficient documentation

## 2015-12-07 DIAGNOSIS — Z794 Long term (current) use of insulin: Secondary | ICD-10-CM | POA: Diagnosis not present

## 2015-12-07 DIAGNOSIS — Z9889 Other specified postprocedural states: Secondary | ICD-10-CM | POA: Insufficient documentation

## 2015-12-07 DIAGNOSIS — Z7984 Long term (current) use of oral hypoglycemic drugs: Secondary | ICD-10-CM | POA: Insufficient documentation

## 2015-12-07 DIAGNOSIS — Z79899 Other long term (current) drug therapy: Secondary | ICD-10-CM | POA: Diagnosis not present

## 2015-12-07 DIAGNOSIS — E119 Type 2 diabetes mellitus without complications: Secondary | ICD-10-CM | POA: Insufficient documentation

## 2015-12-07 DIAGNOSIS — J069 Acute upper respiratory infection, unspecified: Secondary | ICD-10-CM | POA: Diagnosis not present

## 2015-12-07 DIAGNOSIS — H9202 Otalgia, left ear: Secondary | ICD-10-CM | POA: Diagnosis present

## 2015-12-07 DIAGNOSIS — F17200 Nicotine dependence, unspecified, uncomplicated: Secondary | ICD-10-CM | POA: Insufficient documentation

## 2015-12-07 HISTORY — DX: Unspecified malignant neoplasm of skin of unspecified ear and external auricular canal: C44.201

## 2015-12-07 MED ORDER — CYCLOBENZAPRINE HCL 5 MG PO TABS: 5.0000 mg | ORAL_TABLET | Freq: Three times a day (TID) | ORAL | Status: DC | PRN

## 2015-12-07 MED ORDER — ATORVASTATIN CALCIUM 40 MG PO TABS: 40.0000 mg | ORAL_TABLET | Freq: Every day | ORAL | Status: DC

## 2015-12-07 NOTE — ED Provider Notes (Signed)
[facility] Emergency Department Provider Note  ____________________________________________  Time seen: Approximately 7:21 AM  I have reviewed the triage vital signs and the nursing notes.   HISTORY  Chief Complaint Otalgia  HPI Brenda Hicks is a 50 y.o. female is here with complaint of left ear pain starting yesterday. She states her hearing is "muffled". She also has had some nasal congestion has been taking NyQuil which is been making her sleep a lot. She is also been taking some Tylenol and some Aleve which is not helping with her ear pain. She states that she is currently out of her Percocet, Flexeril and Lipitor. When asked about her primary care doctor she states that the doctor she saw in his place last month would not refill her medication and she was told to make another appointment. Her appointment with her primary care doctor is next week.  She continues to take her diabetes medicine as prescribed. She denies any fever, chills, nausea, vomiting. She denies any headache or sore throat. Currently she rates her ear pain is 10 out of 10.    Past Medical History  Diagnosis Date  . Diabetes mellitus without complication ([place])   . Hypertension   . Asthma   . Cancer of ear     There are no active problems to display for this patient.   Past Surgical History  Procedure Laterality Date  . Knee surgery Right     Current Outpatient Rx  Name  Route  Sig  Dispense  Refill  . gabapentin (NEURONTIN) 300 MG capsule   Oral   Take 300 mg by mouth 4 (four) times daily.         . insulin glargine (LANTUS) 100 UNIT/ML injection   Subcutaneous   Inject 95 Units into the skin at bedtime.         . metFORMIN (GLUCOPHAGE) 1000 MG tablet   Oral   Take 1,000 mg by mouth 2 (two) times daily with a meal.         . sitaGLIPtin (JANUVIA) 25 MG tablet   Oral   Take 25 mg by mouth daily.         Marland Kitchen atorvastatin (LIPITOR) 40 MG tablet   Oral   Take 1  tablet (40 mg total) by mouth daily.   15 tablet   11   . cyclobenzaprine (FLEXERIL) 5 MG tablet   Oral   Take 1 tablet (5 mg total) by mouth every 8 (eight) hours as needed for muscle spasms.   15 tablet   1   . ibuprofen (ADVIL,MOTRIN) 800 MG tablet   Oral   Take 1 tablet (800 mg total) by mouth every 8 (eight) hours as needed for mild pain or moderate pain.   15 tablet   0   . oxyCODONE-acetaminophen (PERCOCET) 5-325 MG tablet   Oral   Take 1 tablet by mouth every 4 (four) hours as needed for severe pain.   20 tablet   0   . sulfamethoxazole-trimethoprim (BACTRIM DS,SEPTRA DS) 800-160 MG tablet   Oral   Take 1 tablet by mouth 2 (two) times daily.   20 tablet   0     Allergies Review of patient's allergies indicates no known allergies.  History reviewed. No pertinent family history.  Social History Social History  Substance Use Topics  . Smoking status: Current Every Day Smoker  . Smokeless tobacco: None  . Alcohol Use: No    Review of Systems Constitutional:  No fever/chills ENT: No sore throat. Left ear pain. Positive nasal congestion. Cardiovascular: Denies chest pain. Respiratory: Denies shortness of breath. Gastrointestinal:   No nausea, no vomiting.   Musculoskeletal: Negative for back pain. Skin: Negative for rash. Neurological: Negative for headaches, focal weakness or numbness.  10-point ROS otherwise negative.  ____________________________________________   PHYSICAL EXAM:  VITAL SIGNS: ED Triage Vitals  Enc Vitals Group     BP 12/07/15 0639 148/77 mmHg     Pulse Rate 12/07/15 0639 90     Resp 12/07/15 0639 22     Temp 12/07/15 0639 97.7 F (36.5 C)     Temp Source 12/07/15 0639 Oral     SpO2 12/07/15 0639 99 %     Weight 12/07/15 0639 324 lb (146.965 kg)     Height 12/07/15 0639 5\' 6"  (1.676 m)     Head Cir --      Peak Flow --      Pain Score 12/07/15 0638 10     Pain Loc --      Pain Edu? --      Excl. in [place]? --      Constitutional: Alert and oriented. Well appearing and in no acute distress. Eyes: Conjunctivae are normal. PERRL. EOMI. Head: Atraumatic. Nose: Mild congestion/no rhinnorhea.   Right EAC and TM are clear. Left EAC is slightly obstructed secondary to a skin graft externally secondary to her ear surgery. The canal itself is without exudate and TM is dull with no erythema present. Mouth/Throat: Mucous membranes are moist.  Oropharynx non-erythematous. Neck: No stridor.   Hematological/Lymphatic/Immunilogical: No cervical lymphadenopathy. Cardiovascular: Normal rate, regular rhythm. Grossly normal heart sounds.  Good peripheral circulation. Respiratory: Normal respiratory effort.  No retractions. Lungs CTAB. Gastrointestinal: Soft and nontender. No distention.  Musculoskeletal: No lower extremity tenderness nor edema.  No joint effusions. Neurologic:  Normal speech and language. No gross focal neurologic deficits are appreciated. No gait instability. Skin:  Skin is warm, dry and intact. No rash noted. Psychiatric: Mood and affect are normal. Speech and behavior are normal.  ____________________________________________   LABS (all labs ordered are listed, but only abnormal results are displayed)  Labs Reviewed - No data to display  PROCEDURES  Procedure(s) performed: None  Critical Care performed: No  ____________________________________________   INITIAL IMPRESSION / ASSESSMENT AND PLAN / ED COURSE  Pertinent labs & imaging results that were available during my care of the patient were reviewed by me and considered in my medical decision making (see chart for details).  Patient was told that generally the emergency room does not refill medications however we will refill her Flexeril and Lipitor. We will not be refilling her Percocet and at least come from her primary care doctor. She is to obtain Sudafed PE as needed for nasal congestion as NyQuil has a lot of alcohol which is  causing her sleep during the day.  As a side note patient has been giving her son Percocet for his back pain as relayed by her son who is also being seen. ____________________________________________   FINAL CLINICAL IMPRESSION(S) / ED DIAGNOSES  Final diagnoses:  Acute pain of left ear  Upper respiratory infection      Johnn Hai, PA-C 12/07/15 [place], MD 12/07/15 [id number]

## 2015-12-07 NOTE — Discharge Instructions (Signed)
May take over-the-counter Sudafed PE as needed for nasal congestion which will also help with your ears. Keep your  Appointment with your primary care doctor as he should be the one refilling your medication.  Narcotics are not refilled through the emergency room therefore he must see your primary care doctor.

## 2015-12-07 NOTE — ED Notes (Addendum)
Pt c/o left ear pain/swelling since yesterday morning; history of cancer in that same ear; pt currently out of her percocet, flexeril and lipitor

## 2015-12-07 NOTE — ED Notes (Signed)
Encouraged patient to follow up with PCP for refills on medications and continued ear pain

## 2015-12-07 NOTE — ED Notes (Addendum)
Left ear pain started yesterday. Muffled hearing. Pain around ear near in jaw and tender to the touch. 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 Tylenol and Aleve not helping.

## 2016-01-15 ENCOUNTER — Other Ambulatory Visit: Payer: Self-pay | Admitting: Family Medicine

## 2016-01-15 DIAGNOSIS — Z1239 Encounter for other screening for malignant neoplasm of breast: Secondary | ICD-10-CM

## 2016-02-02 ENCOUNTER — Encounter: Payer: Self-pay | Admitting: *Deleted

## 2016-02-02 ENCOUNTER — Emergency Department
Admission: EM | Admit: 2016-02-02 | Discharge: 2016-02-02 | Disposition: A | Discharge status: Home / Self Care | Payer: Medicaid Other | Attending: Emergency Medicine | Admitting: Emergency Medicine

## 2016-02-02 ENCOUNTER — Emergency Department: Payer: Medicaid Other

## 2016-02-02 DIAGNOSIS — Z7984 Long term (current) use of oral hypoglycemic drugs: Secondary | ICD-10-CM | POA: Insufficient documentation

## 2016-02-02 DIAGNOSIS — Z794 Long term (current) use of insulin: Secondary | ICD-10-CM | POA: Diagnosis not present

## 2016-02-02 DIAGNOSIS — F17200 Nicotine dependence, unspecified, uncomplicated: Secondary | ICD-10-CM | POA: Diagnosis not present

## 2016-02-02 DIAGNOSIS — E119 Type 2 diabetes mellitus without complications: Secondary | ICD-10-CM | POA: Insufficient documentation

## 2016-02-02 DIAGNOSIS — Z792 Long term (current) use of antibiotics: Secondary | ICD-10-CM | POA: Insufficient documentation

## 2016-02-02 DIAGNOSIS — I10 Essential (primary) hypertension: Secondary | ICD-10-CM | POA: Insufficient documentation

## 2016-02-02 DIAGNOSIS — M25561 Pain in right knee: Secondary | ICD-10-CM | POA: Diagnosis present

## 2016-02-02 DIAGNOSIS — Z79899 Other long term (current) drug therapy: Secondary | ICD-10-CM | POA: Diagnosis not present

## 2016-02-02 MED ORDER — ACETAMINOPHEN 500 MG PO TABS
1000.0000 mg | ORAL_TABLET | Freq: Once | ORAL | Status: AC
  Administered 2016-02-02: 1000 mg via ORAL
  Filled 2016-02-02: qty 2

## 2016-02-02 NOTE — Discharge Instructions (Signed)
Please seek medical attention for any high fevers, chest pain, shortness of breath, change in behavior, persistent vomiting, bloody stool or any other new or concerning symptoms.   Knee Pain Knee pain is a very common symptom and can have many causes. Knee pain often goes away when you follow your health care provider's instructions for relieving pain and discomfort at home. However, knee pain can develop into a condition that needs treatment. Some conditions may include:  Arthritis caused by wear and tear (osteoarthritis).  Arthritis caused by swelling and irritation (rheumatoid arthritis or gout).  A cyst or growth in your knee.  An infection in your knee joint.  An injury that will not heal.  Damage, swelling, or irritation of the tissues that support your knee (torn ligaments or tendinitis). If your knee pain continues, additional tests may be ordered to diagnose your condition. Tests may include X-rays or other imaging studies of your knee. You may also need to have fluid removed from your knee. Treatment for ongoing knee pain depends on the cause, but treatment may include:  Medicines to relieve pain or swelling.  Steroid injections in your knee.  Physical therapy.  Surgery. HOME CARE INSTRUCTIONS  Take medicines only as directed by your health care provider.  Rest your knee and keep it raised (elevated) while you are resting.  Do not do things that cause or worsen pain.  Avoid high-impact activities or exercises, such as running, jumping rope, or doing jumping jacks.  Apply ice to the knee area:  Put ice in a plastic bag.  Place a towel between your skin and the bag.  Leave the ice on for 20 minutes, 2-3 times a day.  Ask your health care provider if you should wear an elastic knee support.  Keep a pillow under your knee when you sleep.  Lose weight if you are overweight. Extra weight can put pressure on your knee.  Do not use any tobacco products, including  cigarettes, chewing tobacco, or electronic cigarettes. If you need help quitting, ask your health care provider. Smoking may slow the healing of any bone and joint problems that you may have. SEEK MEDICAL CARE IF:  Your knee pain continues, changes, or gets worse.  You have a fever along with knee pain.  Your knee buckles or locks up.  Your knee becomes more swollen. SEEK IMMEDIATE MEDICAL CARE IF:   Your knee joint feels hot to the touch.  You have chest pain or trouble breathing.   This information is not intended to replace advice given to you by your health care provider. Make sure you discuss any questions you have with your health care provider.   Document Released: 09/05/2007 Document Revised: 11/29/2014 Document Reviewed: 06/24/2014 Elsevier Interactive Patient Education [employer].

## 2016-02-02 NOTE — ED Notes (Signed)
Pt states increased pain starting on Friday night. Pt states worsening pain after walking Sat. Night and is now unable to bear weight on R leg. Pt has hx of R knee replacement x 3 years ago. Pt states she fell on R knee x 2 weeks ago. Pt denies taking any meds for her pain prior to arrival. Pt driven to ED by son.

## 2016-02-02 NOTE — ED Provider Notes (Signed)
[facility] Emergency Department Provider Note    ____________________________________________  Time seen: ~0200  I have reviewed the triage vital signs and the nursing notes.   HISTORY  Chief Complaint Knee Pain   History limited by: Not Limited   HPI Brenda Hicks is a 50 y.o. female who presents to the emergency department today because of concerns for right knee pain. When asked how long it is been she states for quite some time. She does state however it is been worse the past 2 days. Pain is worse when she puts weight on it or when she tries to walk. She states she has tried Advil without any relief. She has been seen by primary care and orthopedics recently.     Past Medical History  Diagnosis Date  . Diabetes mellitus without complication ([place])   . Hypertension   . Asthma   . Cancer of ear     There are no active problems to display for this patient.   Past Surgical History  Procedure Laterality Date  . Knee surgery Right   . Hernia repair      Current Outpatient Rx  Name  Route  Sig  Dispense  Refill  . atorvastatin (LIPITOR) 40 MG tablet   Oral   Take 1 tablet (40 mg total) by mouth daily.   15 tablet   11   . cyclobenzaprine (FLEXERIL) 5 MG tablet   Oral   Take 1 tablet (5 mg total) by mouth every 8 (eight) hours as needed for muscle spasms.   15 tablet   1   . gabapentin (NEURONTIN) 300 MG capsule   Oral   Take 300 mg by mouth 4 (four) times daily.         Marland Kitchen ibuprofen (ADVIL,MOTRIN) 800 MG tablet   Oral   Take 1 tablet (800 mg total) by mouth every 8 (eight) hours as needed for mild pain or moderate pain.   15 tablet   0   . insulin glargine (LANTUS) 100 UNIT/ML injection   Subcutaneous   Inject 95 Units into the skin at bedtime.         . metFORMIN (GLUCOPHAGE) 1000 MG tablet   Oral   Take 1,000 mg by mouth 2 (two) times daily with a meal.         . oxyCODONE-acetaminophen (PERCOCET) 5-325 MG  tablet   Oral   Take 1 tablet by mouth every 4 (four) hours as needed for severe pain.   20 tablet   0   . sitaGLIPtin (JANUVIA) 25 MG tablet   Oral   Take 25 mg by mouth daily.         Marland Kitchen sulfamethoxazole-trimethoprim (BACTRIM DS,SEPTRA DS) 800-160 MG tablet   Oral   Take 1 tablet by mouth 2 (two) times daily.   20 tablet   0     Allergies Review of patient's allergies indicates no known allergies.  History reviewed. No pertinent family history.  Social History Social History  Substance Use Topics  . Smoking status: Current Every Day Smoker  . Smokeless tobacco: None  . Alcohol Use: No    Review of Systems  Constitutional: Negative for fever. Cardiovascular: Negative for chest pain. Respiratory: Negative for shortness of breath. Gastrointestinal: Negative for abdominal pain, vomiting and diarrhea. Neurological: Negative for headaches, focal weakness or numbness. 10-point ROS otherwise negative.  ____________________________________________   PHYSICAL EXAM:   Constitutional: Alert and oriented. Well appearing and in no distress. Eyes:  Conjunctivae are normal. PERRL. Normal extraocular movements. ENT   Head: Normocephalic and atraumatic.   Nose: No congestion/rhinnorhea.   Mouth/Throat: Mucous membranes are moist.   Neck: No stridor. Hematological/Lymphatic/Immunilogical: No cervical lymphadenopathy. Cardiovascular: Normal rate, regular rhythm.  No murmurs, rubs, or gallops. Respiratory: Normal respiratory effort without tachypnea nor retractions. Breath sounds are clear and equal bilaterally. No wheezes/rales/rhonchi. Gastrointestinal: Soft and nontender. No distention. There is no CVA tenderness. Genitourinary: Deferred Musculoskeletal: Normal range of motion in all extremities. Right knee with well healed surgical scar. No effusion. No erythema. No warmth. Mildly tender to palpation diffusely. NV intact distally. Neurologic:  Normal speech and  language. No gross focal neurologic deficits are appreciated.  Skin:  Skin is warm, dry and intact. No rash noted. Psychiatric: Mood and affect are normal. Speech and behavior are normal. Patient exhibits appropriate insight and judgment.  ____________________________________________    LABS (pertinent positives/negatives)  None  ____________________________________________   EKG  None  ____________________________________________    RADIOLOGY  Right knee x-ray  IMPRESSION: No acute finding. Stable appearance of total knee arthroplasty.  ____________________________________________   PROCEDURES  Procedure(s) performed: None  Critical Care performed: No  ____________________________________________   INITIAL IMPRESSION / ASSESSMENT AND PLAN / ED COURSE  Pertinent labs & imaging results that were available during my care of the patient were reviewed by me and considered in my medical decision making (see chart for details).  Patient presented to the emergency department today because of concerns of right knee pain. X-ray without any acute findings. Patient has history of right knee pain. She should follow-up with primary care and orthopedic doctors.  ____________________________________________   FINAL CLINICAL IMPRESSION(S) / ED DIAGNOSES  Final diagnoses:  Right knee pain     Nance Pear, MD 02/02/16 [id number]

## 2016-02-02 NOTE — ED Notes (Signed)
Pt using callbell; c/o right leg pain; rates 10/10 and requesting pain medication; Dr Archie Balboa unavailable at this time but pass

## 2016-02-16 ENCOUNTER — Ambulatory Visit: Payer: Medicaid Other | Attending: Family Medicine

## 2016-03-28 IMAGING — CR DG KNEE COMPLETE 4+V*L*
1 series · 4 of 4 positions shown · non-contrast
Comparison: None.

CLINICAL DATA: Bilateral knee pain for 1 year, recent falls.

EXAM:
LEFT KNEE - COMPLETE 4+ VIEW

[Series 1: ap · 0.17mm/px · 4 of 4 slices shown]
[im 1/4]
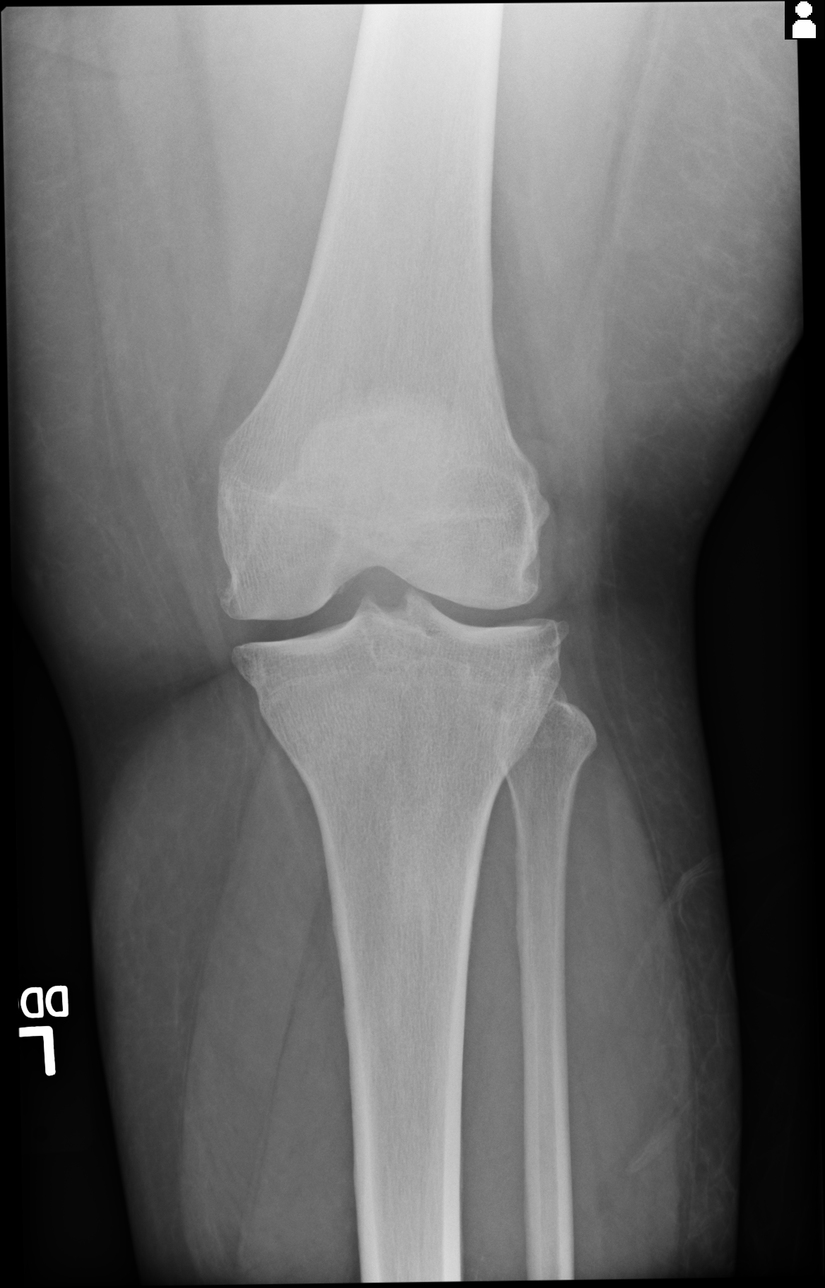
[im 2/4]
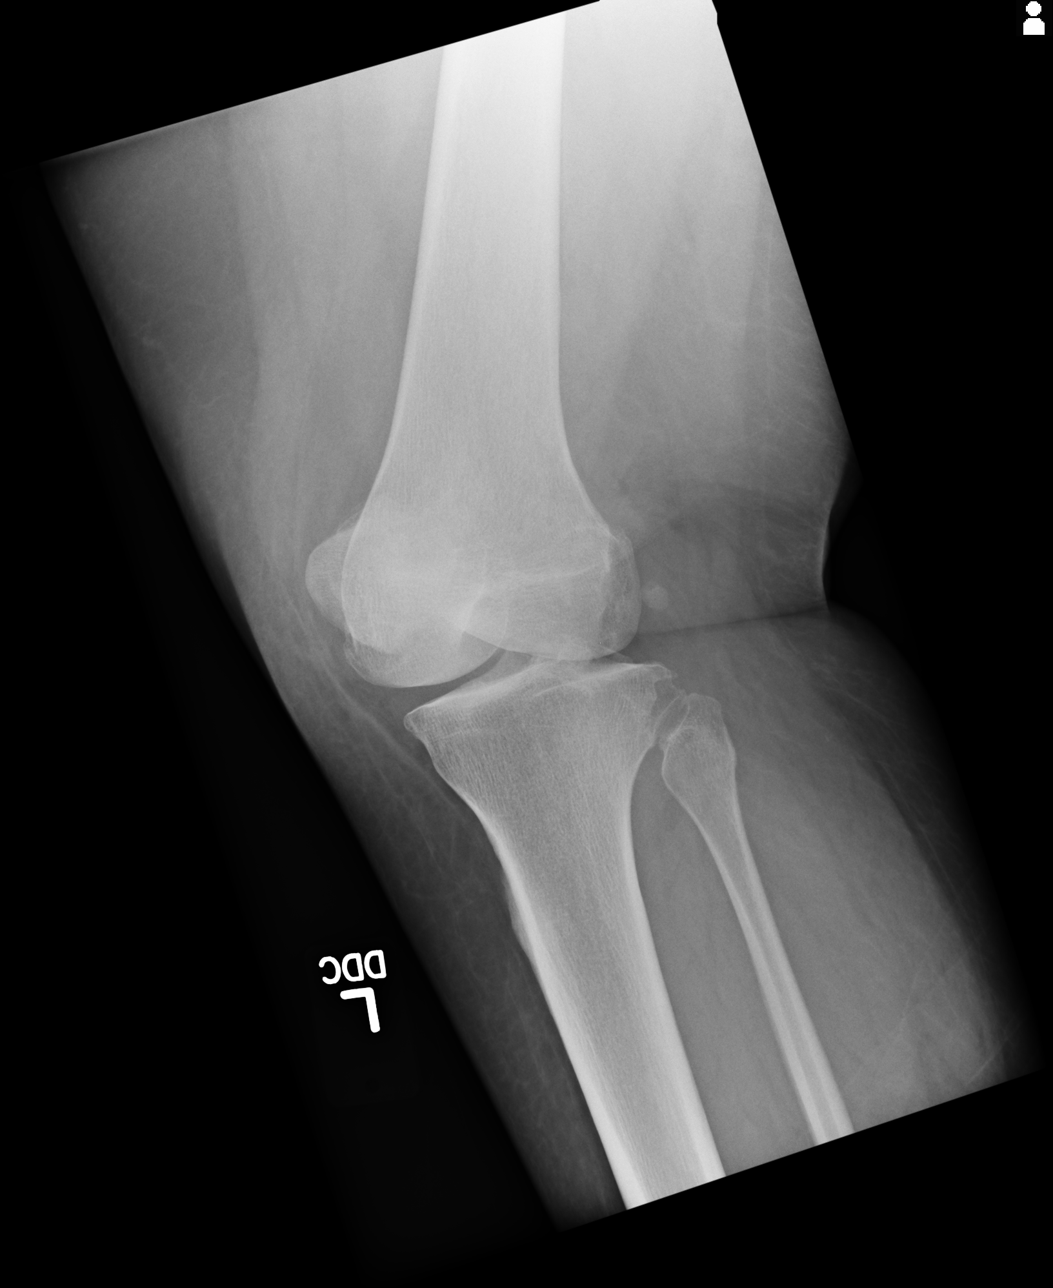
[im 3/4]
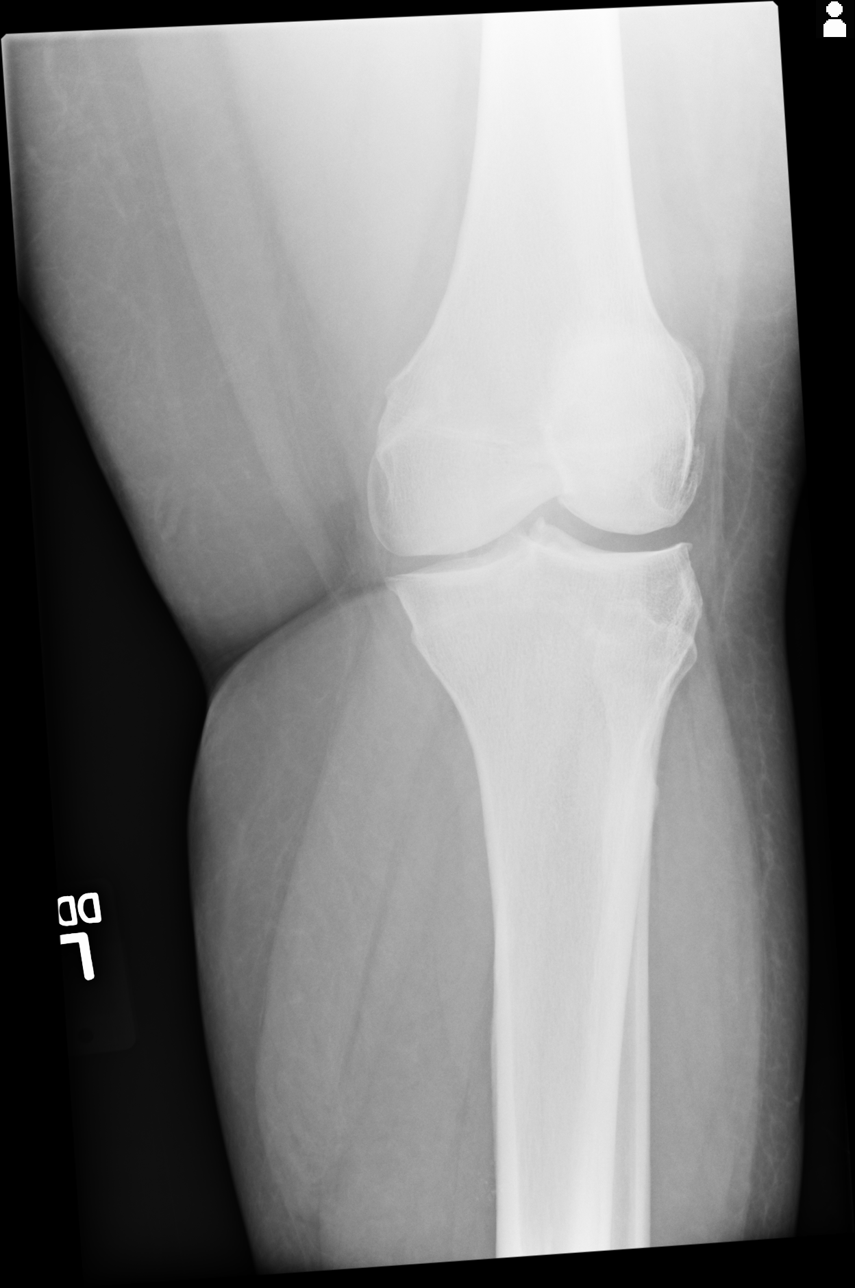
[im 4/4]
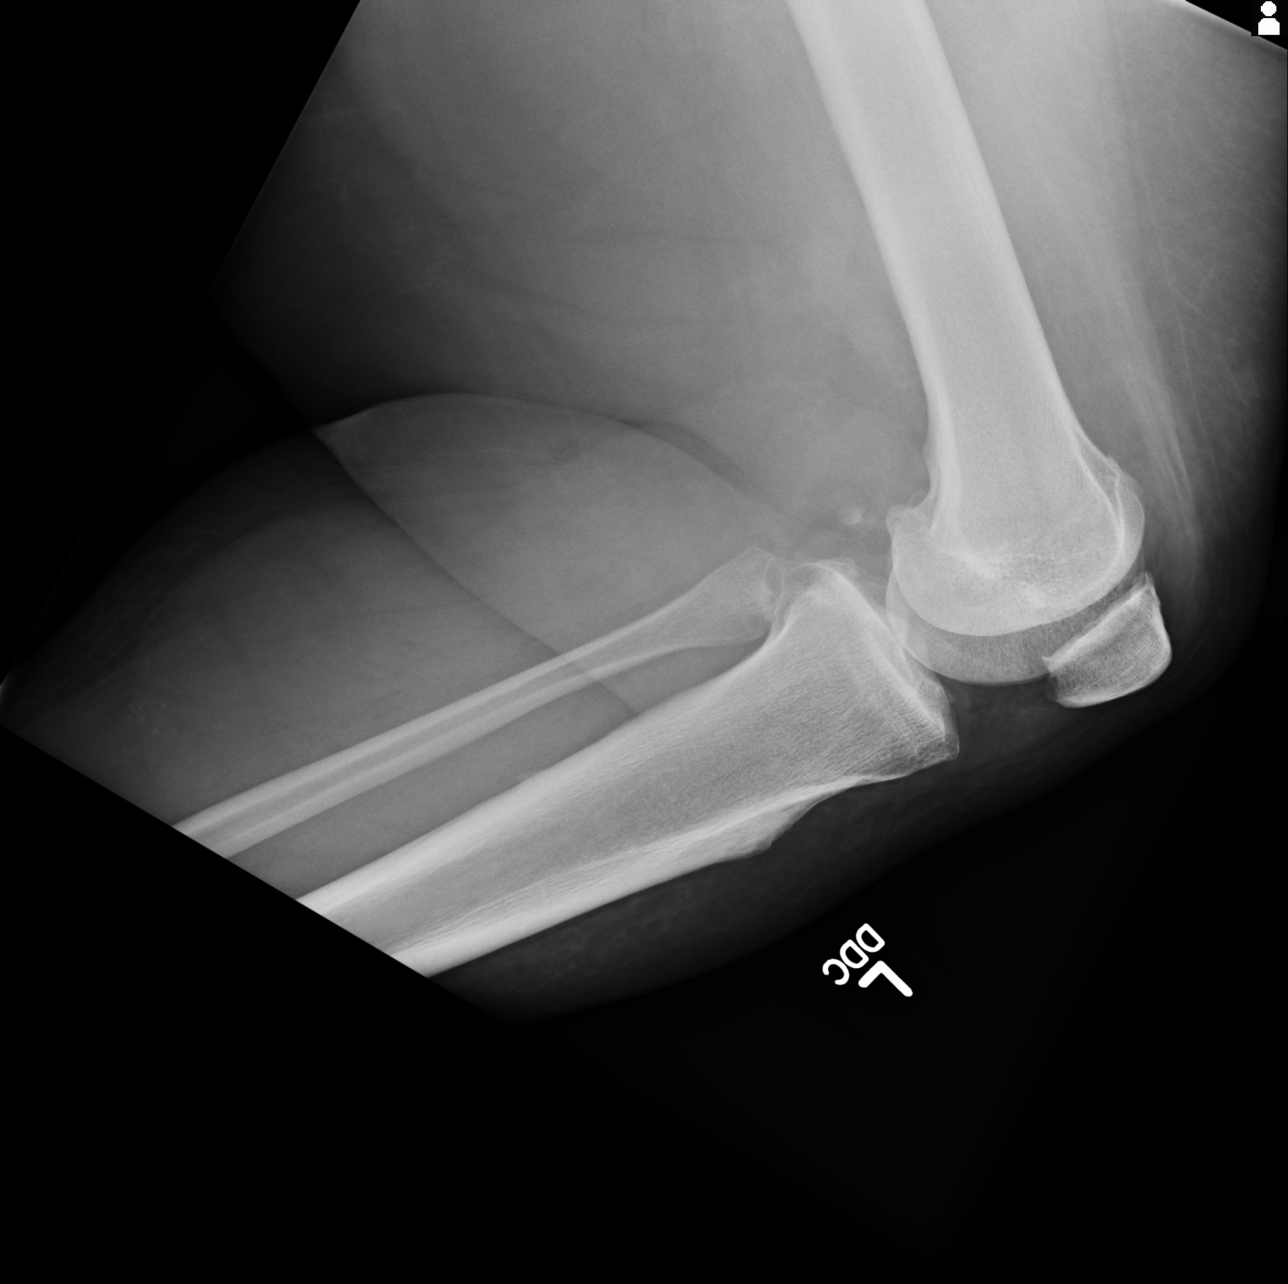

[4 of 4 positions shown; findings below may reference images not displayed]

FINDINGS: There is no evidence of fracture, dislocation, or joint effusion.
There is no evidence of arthropathy or other focal bone abnormality.
Soft tissues are unremarkable. Minimal tricompartmental degenerative
change.
IMPRESSION: Negative.

## 2016-03-28 IMAGING — CR DG KNEE COMPLETE 4+V*R*
1 series · 4 of 4 positions shown · non-contrast
Comparison: Radiograph 12/26/2012.

CLINICAL DATA: Posterior knee pain. Injury. Bilateral knee pain for
1 year. Recent falls.

EXAM:
RIGHT KNEE - COMPLETE 4+ VIEW

[Series 1: lat · 0.17mm/px · 4 of 4 slices shown]
[im 1/4]
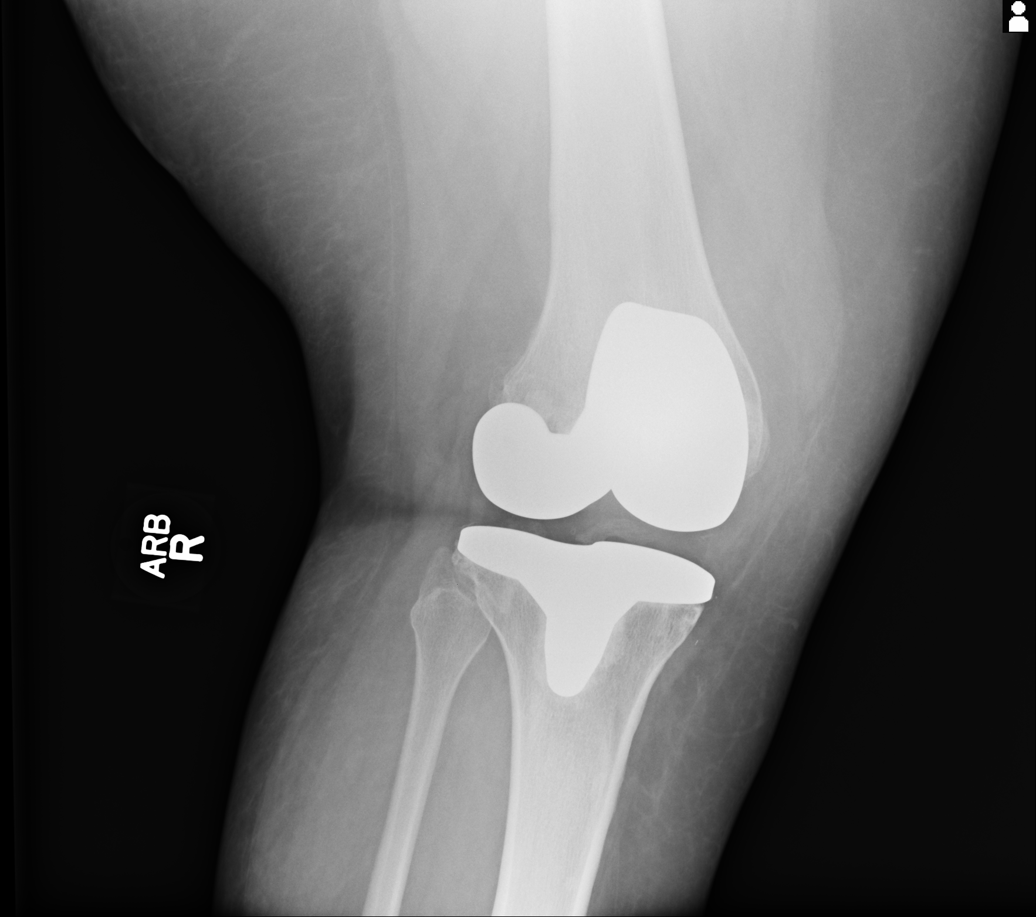
[im 2/4]
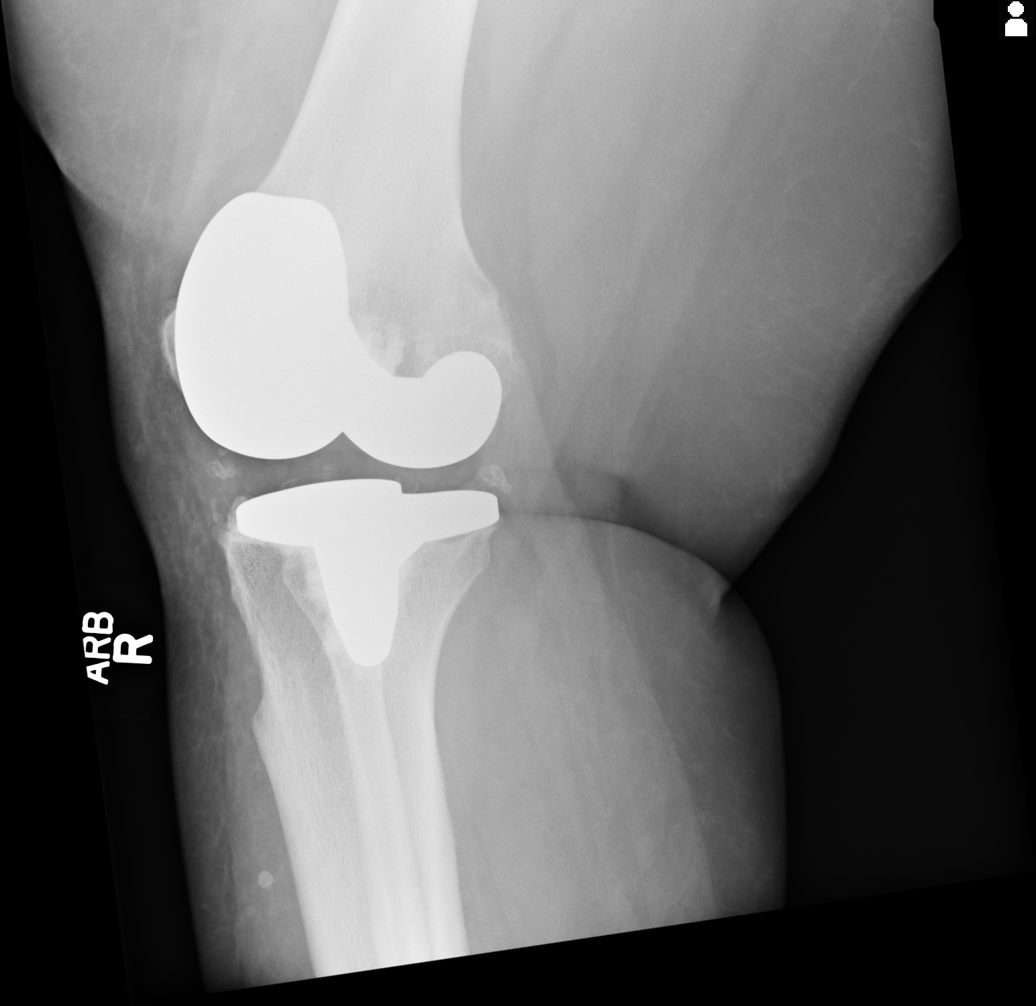
[im 3/4]
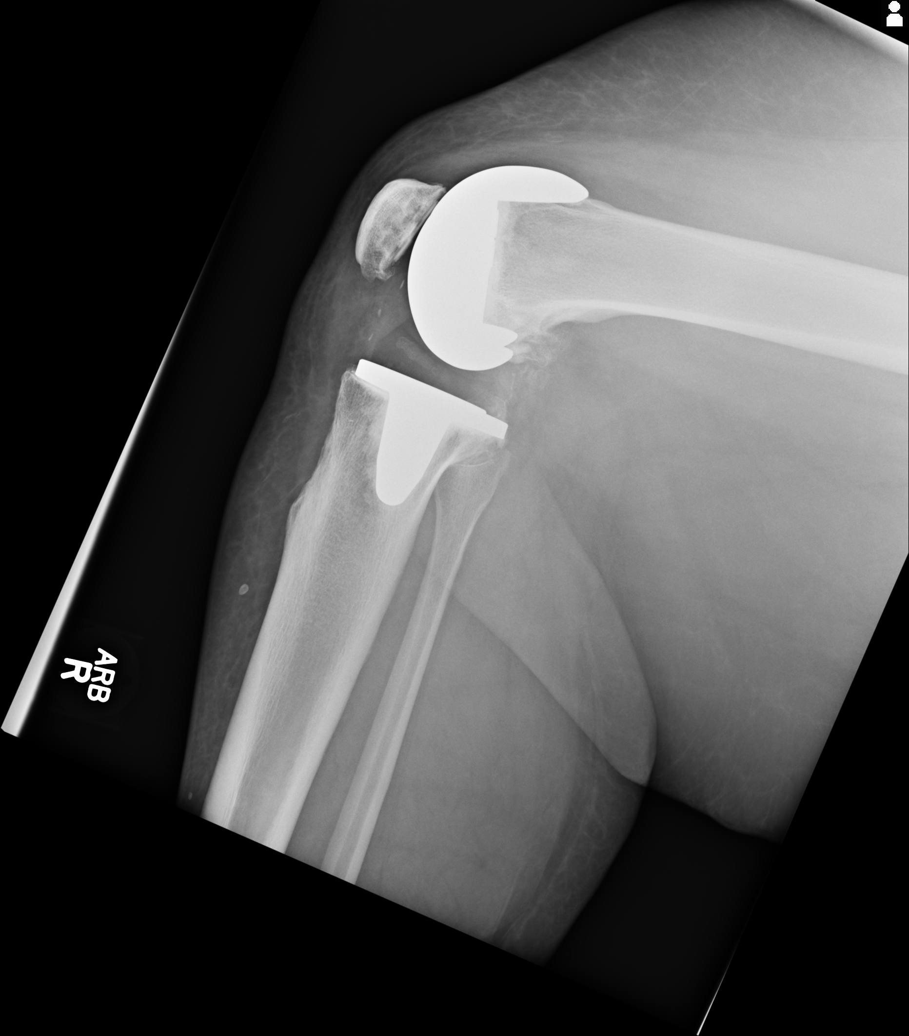
[im 4/4]
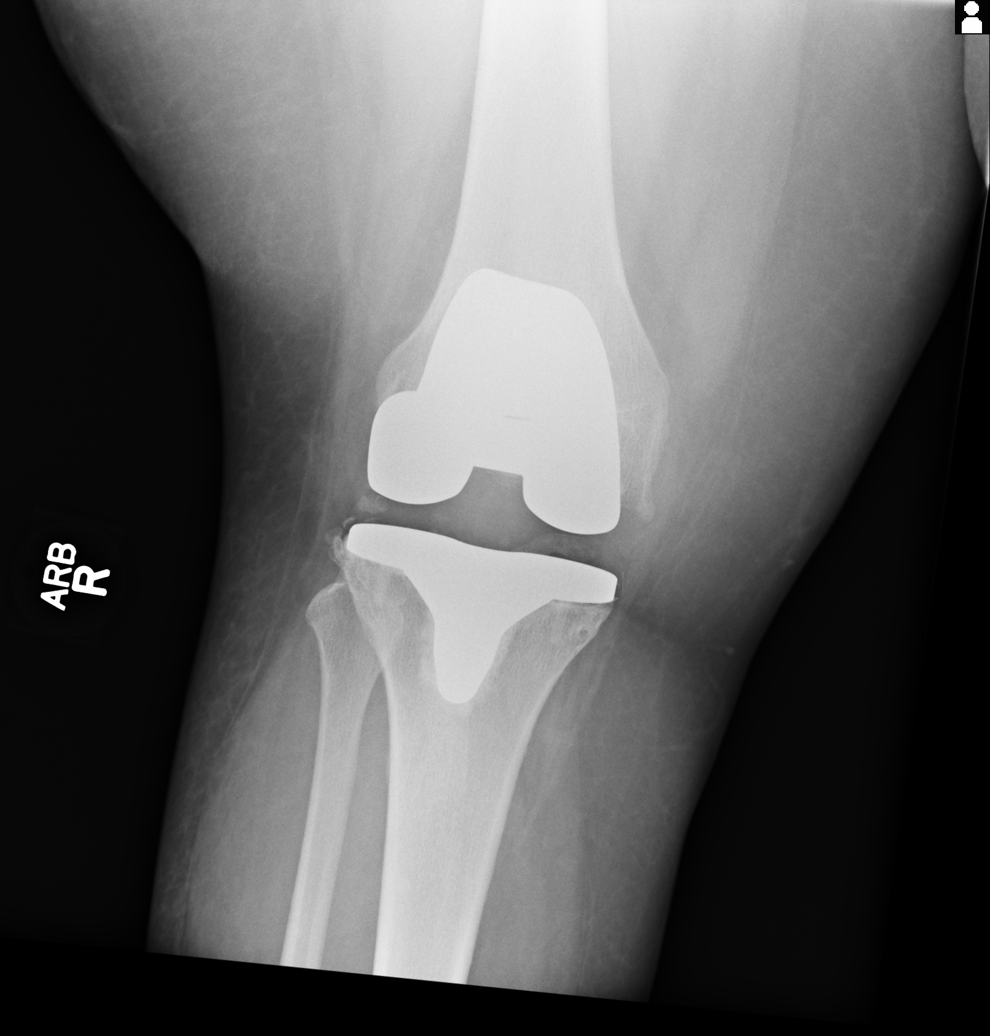

[4 of 4 positions shown; findings below may reference images not displayed]

FINDINGS: RIGHT 3 part total knee arthroplasty. No effusion. The alignment of
the knee is anatomic. Small fragments of heterotopic bone are
present around the posterior lateral compartment. There is lucency
along the medial aspect of the tibial tray suggesting early
loosening. Differential considerations are aseptic mechanical
loosening, indolent infection or particle disease.
IMPRESSION: No acute abnormality. Three part total knee arthroplasty with early
loosening along the medial tibial tray.

## 2016-04-06 ENCOUNTER — Emergency Department
Admission: EM | Admit: 2016-04-06 | Discharge: 2016-04-06 | Disposition: A | Discharge status: Home / Self Care | Payer: Medicaid Other | Attending: Emergency Medicine | Admitting: Emergency Medicine

## 2016-04-06 DIAGNOSIS — J45909 Unspecified asthma, uncomplicated: Secondary | ICD-10-CM | POA: Insufficient documentation

## 2016-04-06 DIAGNOSIS — Z8522 Personal history of malignant neoplasm of nasal cavities, middle ear, and accessory sinuses: Secondary | ICD-10-CM | POA: Diagnosis not present

## 2016-04-06 DIAGNOSIS — Z794 Long term (current) use of insulin: Secondary | ICD-10-CM | POA: Insufficient documentation

## 2016-04-06 DIAGNOSIS — I10 Essential (primary) hypertension: Secondary | ICD-10-CM | POA: Insufficient documentation

## 2016-04-06 DIAGNOSIS — Z7984 Long term (current) use of oral hypoglycemic drugs: Secondary | ICD-10-CM | POA: Diagnosis not present

## 2016-04-06 DIAGNOSIS — F17200 Nicotine dependence, unspecified, uncomplicated: Secondary | ICD-10-CM | POA: Diagnosis not present

## 2016-04-06 DIAGNOSIS — R739 Hyperglycemia, unspecified: Secondary | ICD-10-CM

## 2016-04-06 DIAGNOSIS — E1165 Type 2 diabetes mellitus with hyperglycemia: Secondary | ICD-10-CM | POA: Insufficient documentation

## 2016-04-06 DIAGNOSIS — Z79899 Other long term (current) drug therapy: Secondary | ICD-10-CM | POA: Insufficient documentation

## 2016-04-06 DIAGNOSIS — E1142 Type 2 diabetes mellitus with diabetic polyneuropathy: Secondary | ICD-10-CM | POA: Diagnosis not present

## 2016-04-06 DIAGNOSIS — G6289 Other specified polyneuropathies: Secondary | ICD-10-CM

## 2016-04-06 LAB — URINALYSIS COMPLETE WITH MICROSCOPIC (ARMC ONLY)
Bacteria, UA: NONE SEEN
Bilirubin Urine: NEGATIVE
Glucose, UA: >500 mg/dL — AB
Hgb urine dipstick: NEGATIVE
Ketones, ur: NEGATIVE mg/dL
Leukocytes, UA: NEGATIVE
Nitrite: NEGATIVE
Protein, ur: NEGATIVE mg/dL
RBC / HPF: NONE SEEN RBC/hpf (ref 0–5)
Specific Gravity, Urine: 1.024 (ref 1.005–1.030)
pH: 6 (ref 5.0–8.0)

## 2016-04-06 LAB — GLUCOSE, CAPILLARY
Glucose-Capillary: 207 mg/dL — ABNORMAL HIGH (ref 65–99)
Glucose-Capillary: 326 mg/dL — ABNORMAL HIGH (ref 65–99)

## 2016-04-06 LAB — BASIC METABOLIC PANEL
Anion gap: 7 (ref 5–15)
BUN: 16 mg/dL (ref 6–20)
CALCIUM: 8.9 mg/dL (ref 8.9–10.3)
CO2: 27 mmol/L (ref 22–32)
CREATININE: 0.89 mg/dL (ref 0.44–1.00)
Chloride: 105 mmol/L (ref 101–111)
GFR calc non Af Amer: >60 mL/min (ref >60)
GLUCOSE: 334 mg/dL — AB (ref 65–99)
Potassium: 4.2 mmol/L (ref 3.5–5.1)
Sodium: 139 mmol/L (ref 135–145)

## 2016-04-06 LAB — CBC
HCT: 41.8 % (ref 35.0–47.0)
Hemoglobin: 13.9 g/dL (ref 12.0–16.0)
MCH: 30.4 pg (ref 26.0–34.0)
MCHC: 33.2 g/dL (ref 32.0–36.0)
MCV: 91.7 fL (ref 80.0–100.0)
Platelets: 224 10*3/uL (ref 150–440)
RBC: 4.55 MIL/uL (ref 3.80–5.20)
RDW: 14.4 % (ref 11.5–14.5)
WBC: 8 10*3/uL (ref 3.6–11.0)

## 2016-04-06 LAB — POCT PREGNANCY, URINE: Preg Test, Ur: NEGATIVE

## 2016-04-06 MED ORDER — SODIUM CHLORIDE 0.9 % IV BOLUS (SEPSIS)
1000.0000 mL | Freq: Once | INTRAVENOUS | Status: AC
  Administered 2016-04-06: 1000 mL via INTRAVENOUS

## 2016-04-06 MED ORDER — TRAMADOL HCL 50 MG PO TABS
50.0000 mg | ORAL_TABLET | Freq: Once | ORAL | Status: AC
  Administered 2016-04-06: 50 mg via ORAL

## 2016-04-06 MED ORDER — INSULIN ASPART 100 UNIT/ML ~~LOC~~ SOLN: 8.0000 [IU] | Freq: Once | SUBCUTANEOUS | Status: DC

## 2016-04-06 MED ORDER — INSULIN ASPART 100 UNIT/ML ~~LOC~~ SOLN
6.0000 [IU] | Freq: Once | SUBCUTANEOUS | Status: DC
  Filled 2016-04-06: qty 6

## 2016-04-06 MED ORDER — TRAMADOL HCL 50 MG PO TABS
ORAL_TABLET | ORAL | Status: AC
  Administered 2016-04-06: 50 mg via ORAL
  Filled 2016-04-06: qty 1

## 2016-04-06 MED ORDER — TRAMADOL HCL 50 MG PO TABS: 50.0000 mg | ORAL_TABLET | Freq: Four times a day (QID) | ORAL | Status: DC | PRN

## 2016-04-06 NOTE — ED Notes (Signed)
MD at bedside. 

## 2016-04-06 NOTE — Discharge Instructions (Signed)
Hyperglycemia High blood sugar (hyperglycemia) means that the level of sugar in your blood is higher than it should be. Signs of high blood sugar include:  Feeling thirsty.  Frequent peeing (urinating).  Feeling tired or sleepy.  Dry mouth.  Vision changes.  Feeling weak.  Feeling hungry but losing weight.  Numbness and tingling in your hands or feet.  Headache. When you ignore these signs, your blood sugar may keep going up. These problems may get worse, and other problems may begin. HOME CARE  Check your blood sugars as told by your doctor. Write down the numbers with the date and time.  Take the right amount of insulin or diabetes pills at the right time. Write down the dose with date and time.  Refill your insulin or diabetes pills before running out.  Watch what you eat. Follow your meal plan.  Drink liquids without sugar, such as water. Check with your doctor if you have kidney or heart disease.  Follow your doctor's orders for exercise. Exercise at the same time of day.  Keep your doctor's appointments. GET HELP RIGHT AWAY IF:   You have trouble thinking or are confused.  You have fast breathing with fruity smelling breath.  You pass out (faint).  You have 2 to 3 days of high blood sugars and you do not know why.  You have chest pain.  You are feeling sick to your stomach (nauseous) or throwing up (vomiting).  You have sudden vision changes. MAKE SURE YOU:   Understand these instructions.  Will watch your condition.  Will get help right away if you are not doing well or get worse.   This information is not intended to replace advice given to you by your health care provider. Make sure you discuss any questions you have with your health care provider.   Document Released: 09/05/2009 Document Revised: 11/29/2014 Document Reviewed: 07/15/2015 Elsevier Interactive Patient Education 2016 [employer]  Peripheral Neuropathy Peripheral neuropathy is  a type of nerve damage. It affects nerves that carry signals between the spinal cord and other parts of the body. These are called peripheral nerves. With peripheral neuropathy, one nerve or a group of nerves may be damaged.  CAUSES  Many things can damage peripheral nerves. For some people with peripheral neuropathy, the cause is unknown. Some causes include:  Diabetes. This is the most common cause of peripheral neuropathy.  Injury to a nerve.  Pressure or stress on a nerve that lasts a long time.  Too little vitamin B. Alcoholism can lead to this.  Infections.  Autoimmune diseases, such as multiple sclerosis and systemic lupus erythematosus.  Inherited nerve diseases.  Some medicines, such as cancer drugs.  Toxic substances, such as lead and mercury.  Too little blood flowing to the legs.  Kidney disease.  Thyroid disease. SIGNS AND SYMPTOMS  Different people have different symptoms. The symptoms you have will depend on which of your nerves is damaged. Common symptoms include:  Loss of feeling (numbness) in the feet and hands.  Tingling in the feet and hands.  Pain that burns.  Very sensitive skin.  Weakness.  Not being able to move a part of the body (paralysis).  Muscle twitching.  Clumsiness or poor coordination.  Loss of balance.  Not being able to control your bladder.  Feeling dizzy.  Sexual problems. DIAGNOSIS  Peripheral neuropathy is a symptom, not a disease. Finding the cause of peripheral neuropathy can be hard. To figure that out, your health care  provider will take a medical history and do a physical exam. A neurological exam will also be done. This involves checking things affected by your brain, spinal cord, and nerves (nervous system). For example, your health care provider will check your reflexes, how you move, and what you can feel.  Other types of tests may also be ordered, such as:  Blood tests.  A test of the fluid in your spinal  cord.  Imaging tests, such as CT scans or an MRI.  Electromyography (EMG). This test checks the nerves that control muscles.  Nerve conduction velocity tests. These tests check how fast messages pass through your nerves.  Nerve biopsy. A small piece of nerve is removed. It is then checked under a microscope. TREATMENT   Medicine is often used to treat peripheral neuropathy. Medicines may include:  Pain-relieving medicines. Prescription or over-the-counter medicine may be suggested.  Antiseizure medicine. This may be used for pain.  Antidepressants. These also may help ease pain from neuropathy.  Lidocaine. This is a numbing medicine. You might wear a patch or be given a shot.  Mexiletine. This medicine is typically used to help control irregular heart rhythms.  Surgery. Surgery may be needed to relieve pressure on a nerve or to destroy a nerve that is causing pain.  Physical therapy to help movement.  Assistive devices to help movement. HOME CARE INSTRUCTIONS   Only take over-the-counter or prescription medicines as directed by your health care provider. Follow the instructions carefully for any given medicines. Do not take any other medicines without first getting approval from your health care provider.  If you have diabetes, work closely with your health care provider to keep your blood sugar under control.  If you have numbness in your feet:  Check every day for signs of injury or infection. Watch for redness, warmth, and swelling.  Wear padded socks and comfortable shoes. These help protect your feet.  Do not do things that put pressure on your damaged nerve.  Do not smoke. Smoking keeps blood from getting to damaged nerves.  Avoid or limit alcohol. Too much alcohol can cause a lack of B vitamins. These vitamins are needed for healthy nerves.  Develop a good support system. Coping with peripheral neuropathy can be stressful. Talk to a mental health specialist or  join a support group if you are struggling.  Follow up with your health care provider as directed. SEEK MEDICAL CARE IF:   You have new signs or symptoms of peripheral neuropathy.  You are struggling emotionally from dealing with peripheral neuropathy.  You have a fever. SEEK IMMEDIATE MEDICAL CARE IF:   You have an injury or infection that is not healing.  You feel very dizzy or begin vomiting.  You have chest pain.  You have trouble breathing.   This information is not intended to replace advice given to you by your health care provider. Make sure you discuss any questions you have with your health care provider.   Document Released: 10/29/2002 Document Revised: 07/21/2011 Document Reviewed: 07/16/2013 Elsevier Interactive Patient Education [employer].

## 2016-04-06 NOTE — ED Provider Notes (Signed)
[facility] Emergency Department Provider Note  Time seen: 3:36 AM  I have reviewed the triage vital signs and the nursing notes.   HISTORY  Chief Complaint Hyperglycemia    HPI Brenda Hicks is a 50 y.o. female with a past medical history of diabetes, hypertension, peripheral neuropathy, presents the emergency department with elevated blood glucose greater than 400 as well as bilateral lower extremity pain. According to the patient for the past week she has had increase pain in her bilateral feet. She describes the pain as sharp pains, occasionally shooting up her legs. Patient has a history of peripheral neuropathy for which she takes gabapentin at home. Patient also states for the past several days her blood sugars have been running high, today was greater than 400 so she came to the emergency department for evaluation. Patient denies any abdominal pain, nausea, vomiting, diarrhea, dysuria, fever. Denies any focal weakness or numbness. Denies any headache, confusion or slurred speech.Describes a pain in her feet as mild to moderate, sharp shooting pains.     Past Medical History  Diagnosis Date  . Diabetes mellitus without complication ([place])   . Hypertension   . Asthma   . Cancer of ear     There are no active problems to display for this patient.   Past Surgical History  Procedure Laterality Date  . Knee surgery Right   . Hernia repair      Current Outpatient Rx  Name  Route  Sig  Dispense  Refill  . atorvastatin (LIPITOR) 40 MG tablet   Oral   Take 1 tablet (40 mg total) by mouth daily.   15 tablet   11   . cyclobenzaprine (FLEXERIL) 5 MG tablet   Oral   Take 1 tablet (5 mg total) by mouth every 8 (eight) hours as needed for muscle spasms.   15 tablet   1   . gabapentin (NEURONTIN) 300 MG capsule   Oral   Take 300 mg by mouth 4 (four) times daily.         Marland Kitchen ibuprofen (ADVIL,MOTRIN) 800 MG tablet   Oral   Take 1 tablet (800 mg  total) by mouth every 8 (eight) hours as needed for mild pain or moderate pain.   15 tablet   0   . insulin glargine (LANTUS) 100 UNIT/ML injection   Subcutaneous   Inject 95 Units into the skin at bedtime.         . metFORMIN (GLUCOPHAGE) 1000 MG tablet   Oral   Take 1,000 mg by mouth 2 (two) times daily with a meal.         . oxyCODONE-acetaminophen (PERCOCET) 5-325 MG tablet   Oral   Take 1 tablet by mouth every 4 (four) hours as needed for severe pain.   20 tablet   0   . sitaGLIPtin (JANUVIA) 25 MG tablet   Oral   Take 25 mg by mouth daily.         Marland Kitchen sulfamethoxazole-trimethoprim (BACTRIM DS,SEPTRA DS) 800-160 MG tablet   Oral   Take 1 tablet by mouth 2 (two) times daily.   20 tablet   0     Allergies Review of patient's allergies indicates no known allergies.  No family history on file.  Social History Social History  Substance Use Topics  . Smoking status: Current Every Day Smoker  . Smokeless tobacco: Not on file  . Alcohol Use: No    Review of Systems Constitutional: Negative  for fever. Cardiovascular: Negative for chest pain. Respiratory: Negative for shortness of breath. Gastrointestinal: Negative for abdominal pain Genitourinary: Negative for dysuria Neurological: Negative for headache 10-point ROS otherwise negative.  ____________________________________________   PHYSICAL EXAM:  VITAL SIGNS: ED Triage Vitals  Enc Vitals Group     BP 04/06/16 0010 144/93 mmHg     Pulse Rate 04/06/16 0010 90     Resp 04/06/16 0010 20     Temp 04/06/16 0010 98.4 F (36.9 C)     Temp Source 04/06/16 0010 Oral     SpO2 04/06/16 0010 97 %     Weight 04/06/16 0010 320 lb (145.151 kg)     Height 04/06/16 0010 5\' 5"  (1.651 m)     Head Cir --      Peak Flow --      Pain Score 04/06/16 0010 8     Pain Loc --      Pain Edu? --      Excl. in [place]? --     Constitutional: Alert and oriented. Well appearing and in no distress. Eyes: Normal exam ENT    Head: Normocephalic and atraumatic.   Mouth/Throat: Mucous membranes are moist. Cardiovascular: Normal rate, regular rhythm. No murmur Respiratory: Normal respiratory effort without tachypnea nor retractions. Breath sounds are clear  Gastrointestinal: Soft and nontender. No distention.   Musculoskeletal: Nontender with normal range of motion in all extremities. Neurologic:  Normal speech and language. No gross focal neurologic deficits Skin:  Skin is warm, dry and intact.  Psychiatric: Mood and affect are normal.  ____________________________________________    INITIAL IMPRESSION / ASSESSMENT AND PLAN / ED COURSE  Pertinent labs & imaging results that were available during my care of the patient were reviewed by me and considered in my medical decision making (see chart for details).  Patient presents the emergency department with elevated blood glucose, on last check she is down to 326. We'll place an IV, dose insulin, dose IV fluids. Otherwise the patient's workup appears normal, normal anion gap, negative urinalysis. Patient's bilateral foot pain is most consistent with peripheral neuropathy. We will discharge with a short course of Ultram as needed for pain. I discussed with the patient the need to closely monitor her blood glucose readings and follow up with her primary care physician to discuss her current medication regimen.  Blood glucose has decreased to 200 in the emergency department. Patient feeling well. We'll discharge home with a short course of Ultram. I discussed with the patient eating small meals throughout the day and closely monitoring her blood glucose at home. Patient agreeable.  ____________________________________________   FINAL CLINICAL IMPRESSION(S) / ED DIAGNOSES  Peripheral neuropathy Hyperglycemia   Harvest Dark, MD 04/06/16 [phone]

## 2016-04-06 NOTE — ED Notes (Signed)
Patient ambulatory to triage with steady gait, without difficulty or distress noted; pt reports sharp pain to left leg, body aches, hyperglycemia today (FSBS at home 400's)

## 2016-05-14 ENCOUNTER — Encounter: Payer: Self-pay | Admitting: Medical Oncology

## 2016-05-14 ENCOUNTER — Emergency Department
Admission: EM | Admit: 2016-05-14 | Discharge: 2016-05-14 | Disposition: A | Discharge status: Home / Self Care | Payer: Medicaid Other | Attending: Student | Admitting: Student

## 2016-05-14 DIAGNOSIS — Z794 Long term (current) use of insulin: Secondary | ICD-10-CM | POA: Insufficient documentation

## 2016-05-14 DIAGNOSIS — E119 Type 2 diabetes mellitus without complications: Secondary | ICD-10-CM | POA: Diagnosis not present

## 2016-05-14 DIAGNOSIS — L02416 Cutaneous abscess of left lower limb: Secondary | ICD-10-CM | POA: Insufficient documentation

## 2016-05-14 DIAGNOSIS — F17200 Nicotine dependence, unspecified, uncomplicated: Secondary | ICD-10-CM | POA: Diagnosis not present

## 2016-05-14 DIAGNOSIS — Z8522 Personal history of malignant neoplasm of nasal cavities, middle ear, and accessory sinuses: Secondary | ICD-10-CM | POA: Insufficient documentation

## 2016-05-14 DIAGNOSIS — J45909 Unspecified asthma, uncomplicated: Secondary | ICD-10-CM | POA: Diagnosis not present

## 2016-05-14 DIAGNOSIS — I10 Essential (primary) hypertension: Secondary | ICD-10-CM | POA: Diagnosis not present

## 2016-05-14 DIAGNOSIS — Z7984 Long term (current) use of oral hypoglycemic drugs: Secondary | ICD-10-CM | POA: Diagnosis not present

## 2016-05-14 MED ORDER — LIDOCAINE-EPINEPHRINE (PF) 1 %-1:200000 IJ SOLN
10.0000 mL | Freq: Once | INTRAMUSCULAR | Status: AC
  Administered 2016-05-14: 10 mL
  Filled 2016-05-14: qty 30

## 2016-05-14 MED ORDER — TRAMADOL HCL 50 MG PO TABS
50.0000 mg | ORAL_TABLET | Freq: Four times a day (QID) | ORAL | Status: DC | PRN
Start: 2016-05-14 — End: 2016-05-25

## 2016-05-14 MED ORDER — SULFAMETHOXAZOLE-TRIMETHOPRIM 800-160 MG PO TABS: 1.0000 | ORAL_TABLET | Freq: Two times a day (BID) | ORAL | Status: DC

## 2016-05-14 NOTE — ED Provider Notes (Signed)
[facility] Emergency Department Provider Note  ____________________________________________  Time seen: Approximately 4:25 PM  I have reviewed the triage vital signs and the nursing notes.   HISTORY  Chief Complaint Abscess    HPI Brenda Hicks is a 50 y.o. female who presents emergency department complaining of an abscess to the back of left thigh. Patient states that she has had an abscess there in the past approximately 4 months prior. Patient states that time it ruptured and did not need drainage. She is placed on antibiotics but states that this is coming back in the same area. Patient states that this ruptured 2 days prior with thick purulent material. Patient reports that area started to heal over and has redeveloped in size and pain. Patient denies any fevers or chills, nausea or vomiting, abdominal pain or other systemic complaints. Patient has not had any medications for this complaint prior to arrival. Patient is a diabetic, has history of hypertension, and intermittent asthma.   Past Medical History  Diagnosis Date  . Diabetes mellitus without complication ([place])   . Hypertension   . Asthma   . Cancer of ear     There are no active problems to display for this patient.   Past Surgical History  Procedure Laterality Date  . Knee surgery Right   . Hernia repair      Current Outpatient Rx  Name  Route  Sig  Dispense  Refill  . atorvastatin (LIPITOR) 40 MG tablet   Oral   Take 1 tablet (40 mg total) by mouth daily.   15 tablet   11   . cyclobenzaprine (FLEXERIL) 5 MG tablet   Oral   Take 1 tablet (5 mg total) by mouth every 8 (eight) hours as needed for muscle spasms.   15 tablet   1   . gabapentin (NEURONTIN) 300 MG capsule   Oral   Take 300 mg by mouth 4 (four) times daily.         Marland Kitchen ibuprofen (ADVIL,MOTRIN) 800 MG tablet   Oral   Take 1 tablet (800 mg total) by mouth every 8 (eight) hours as needed for mild pain or moderate  pain.   15 tablet   0   . insulin glargine (LANTUS) 100 UNIT/ML injection   Subcutaneous   Inject 95 Units into the skin at bedtime.         . metFORMIN (GLUCOPHAGE) 1000 MG tablet   Oral   Take 1,000 mg by mouth 2 (two) times daily with a meal.         . oxyCODONE-acetaminophen (PERCOCET) 5-325 MG tablet   Oral   Take 1 tablet by mouth every 4 (four) hours as needed for severe pain.   20 tablet   0   . sitaGLIPtin (JANUVIA) 25 MG tablet   Oral   Take 25 mg by mouth daily.         Marland Kitchen sulfamethoxazole-trimethoprim (BACTRIM DS,SEPTRA DS) 800-160 MG tablet   Oral   Take 1 tablet by mouth 2 (two) times daily.   14 tablet   0   . traMADol (ULTRAM) 50 MG tablet   Oral   Take 1 tablet (50 mg total) by mouth every 6 (six) hours as needed.   10 tablet   0     Allergies Review of patient's allergies indicates no known allergies.  No family history on file.  Social History Social History  Substance Use Topics  . Smoking status: Current Every  Day Smoker  . Smokeless tobacco: None  . Alcohol Use: No     Review of Systems  Constitutional: No fever/chills Cardiovascular: no chest pain. Respiratory: no cough. No SOB. Gastrointestinal: No abdominal pain.  No nausea, no vomiting.   Musculoskeletal: Negative for musculoskeletal pain. Skin: Positive for "boil" to L posterior thigh Neurological: Negative for headaches, focal weakness or numbness. 10-point ROS otherwise negative.  ____________________________________________   PHYSICAL EXAM:  VITAL SIGNS: ED Triage Vitals  Enc Vitals Group     BP 05/14/16 1620 165/103 mmHg     Pulse Rate 05/14/16 1620 99     Resp 05/14/16 1620 18     Temp 05/14/16 1619 98 F (36.7 C)     Temp Source 05/14/16 1619 Oral     SpO2 05/14/16 1620 100 %     Weight 05/14/16 1619 297 lb (134.718 kg)     Height 05/14/16 1619 5\' 5"  (1.651 m)     Head Cir --      Peak Flow --      Pain Score 05/14/16 1619 10     Pain Loc --       Pain Edu? --      Excl. in [place]? --      Constitutional: Alert and oriented. Well appearing and in no acute distress. Eyes: Conjunctivae are normal. PERRL. EOMI. Head: Atraumatic. ENT:      Ears:       Nose: No congestion/rhinnorhea.      Mouth/Throat: Mucous membranes are moist.  Neck: No stridor.    Cardiovascular: Normal rate, regular rhythm. Normal S1 and S2.  Good peripheral circulation. Respiratory: Normal respiratory effort without tachypnea or retractions. Lungs CTAB. Good air entry to the bases with no decreased or absent breath sounds. Musculoskeletal: Full range of motion to all extremities. No gross deformities appreciated. Neurologic:  Normal speech and language. No gross focal neurologic deficits are appreciated.  Skin:  Erythematous and edematous skin lesion noted to posterior L upper leg. This area has a central induration that is oozing purlent drainage. Area is fluctuant and tender to palpation. Area is approximately cm in diameter. This measurement includes surrounding cellulitis. Psychiatric: Mood and affect are normal. Speech and behavior are normal. Patient exhibits appropriate insight and judgement.   ____________________________________________   LABS (all labs ordered are listed, but only abnormal results are displayed)  Labs Reviewed - No data to display ____________________________________________  EKG   ____________________________________________  RADIOLOGY   No results found.  ____________________________________________    PROCEDURES  Procedure(s) performed:    INCISION AND DRAINAGE Performed by: Charline Bills Cuthriell Consent: Verbal consent obtained. Risks and benefits: risks, benefits and alternatives were discussed Type: abscess  Body area: L posterior upper leg  Anesthesia: local infiltration  Incision was made with a scalpel.  Local anesthetic: lidocaine 1% with epinephrine  Anesthetic total: 6 ml  Complexity:  complex Blunt dissection to break up loculations  Drainage: purulent  Drainage amount: moderate  Packing material: none placed. Area was shallow and would not handle packing material  Patient tolerance: Patient tolerated the procedure well with no immediate complications.      Medications  lidocaine-EPINEPHrine (XYLOCAINE-EPINEPHrine) 1 %-1:200000 (PF) injection 10 mL (10 mLs Infiltration Given 05/14/16 1702)     ____________________________________________   INITIAL IMPRESSION / ASSESSMENT AND PLAN / ED COURSE  Pertinent labs & imaging results that were available during my care of the patient were reviewed by me and considered in my medical decision making (see chart  for details).  Patient's diagnosis is consistent with abscess to posterior upper leg. This requires incision and drainage and is performed as described above.Packing was not placed due to the shallow nature of this abscess. Patient has had a similar abscess approximately 4 months prior in same location. Patient will be discharged home with prescriptions for antibiotics and limited pain medication. Patient is to follow up with primary care provider as needed or otherwise directed. Patient is given ED precautions to return to the ED for any worsening or new symptoms.     ____________________________________________  FINAL CLINICAL IMPRESSION(S) / ED DIAGNOSES  Final diagnoses:  Abscess of leg, left      NEW MEDICATIONS STARTED DURING THIS VISIT:  Discharge Medication List as of 05/14/2016  5:12 PM          This chart was dictated using voice recognition software/Dragon. Despite best efforts to proofread, errors can occur which can change the meaning. Any change was purely unintentional.    Darletta Moll, PA-C 05/14/16 2129  Joanne Gavel, MD 05/14/16 2314

## 2016-05-14 NOTE — ED Notes (Signed)
Pt verbalized understanding of discharge instructions. NAD at this time. 

## 2016-05-14 NOTE — Discharge Instructions (Signed)
Abscess °An abscess is an infected area that contains a collection of pus and debris. It can occur in almost any part of the body. An abscess is also known as a furuncle or boil. °CAUSES  °An abscess occurs when tissue gets infected. This can occur from blockage of oil or sweat glands, infection of hair follicles, or a minor injury to the skin. As the body tries to fight the infection, pus collects in the area and creates pressure under the skin. This pressure causes pain. People with weakened immune systems have difficulty fighting infections and get certain abscesses more often.  °SYMPTOMS °Usually an abscess develops on the skin and becomes a painful mass that is red, warm, and tender. If the abscess forms under the skin, you may feel a moveable soft area under the skin. Some abscesses break open (rupture) on their own, but most will continue to get worse without care. The infection can spread deeper into the body and eventually into the bloodstream, causing you to feel ill.  °DIAGNOSIS  °Your caregiver will take your medical history and perform a physical exam. A sample of fluid may also be taken from the abscess to determine what is causing your infection. °TREATMENT  °Your caregiver may prescribe antibiotic medicines to fight the infection. However, taking antibiotics alone usually does not cure an abscess. Your caregiver may need to make a small cut (incision) in the abscess to drain the pus. In some cases, gauze is packed into the abscess to reduce pain and to continue draining the area. °HOME CARE INSTRUCTIONS  °· Only take over-the-counter or prescription medicines for pain, discomfort, or fever as directed by your caregiver. °· If you were prescribed antibiotics, take them as directed. Finish them even if you start to feel better. °· If gauze is used, follow your caregiver's directions for changing the gauze. °· To avoid spreading the infection: °· Keep your draining abscess covered with a  bandage. °· Wash your hands well. °· Do not share personal care items, towels, or whirlpools with others. °· Avoid skin contact with others. °· Keep your skin and clothes clean around the abscess. °· Keep all follow-up appointments as directed by your caregiver. °SEEK MEDICAL CARE IF:  °· You have increased pain, swelling, redness, fluid drainage, or bleeding. °· You have muscle aches, chills, or a general ill feeling. °· You have a fever. °MAKE SURE YOU:  °· Understand these instructions. °· Will watch your condition. °· Will get help right away if you are not doing well or get worse. °  °This information is not intended to replace advice given to you by your health care provider. Make sure you discuss any questions you have with your health care provider. °  °Document Released: 08/18/2005 Document Revised: 05/09/2012 Document Reviewed: 01/21/2012 °Elsevier Interactive Patient Education ©2016 [employer] ° °Incision and Drainage °Incision and drainage is a procedure in which a sac-like structure (cystic structure) is opened and drained. The area to be drained usually contains material such as pus, fluid, or blood.  °LET YOUR CAREGIVER KNOW ABOUT:  °· Allergies to medicine. °· Medicines taken, including vitamins, herbs, eyedrops, over-the-counter medicines, and creams. °· Use of steroids (by mouth or creams). °· Previous problems with anesthetics or numbing medicines. °· History of bleeding problems or blood clots. °· Previous surgery. °· Other health problems, including diabetes and kidney problems. °· Possibility of pregnancy, if this applies. °RISKS AND COMPLICATIONS °· Pain. °· Bleeding. °· Scarring. °· Infection. °BEFORE THE PROCEDURE  °  You may need to have an ultrasound or other imaging tests to see how large or deep your cystic structure is. Blood tests may also be used to determine if you have an infection or how severe the infection is. You may need to have a tetanus shot. °PROCEDURE  °The affected area  is cleaned with a cleaning fluid. The cyst area will then be numbed with a medicine (local anesthetic). A small incision will be made in the cystic structure. A syringe or catheter may be used to drain the contents of the cystic structure, or the contents may be squeezed out. The area will then be flushed with a cleansing solution. After cleansing the area, it is often gently packed with a gauze or another wound dressing. Once it is packed, it will be covered with gauze and tape or some other type of wound dressing.  °AFTER THE PROCEDURE  °· Often, you will be allowed to go home right after the procedure. °· You may be given antibiotic medicine to prevent or heal an infection. °· If the area was packed with gauze or some other wound dressing, you will likely need to come back in 1 to 2 days to get it removed. °· The area should heal in about 14 days. °  °This information is not intended to replace advice given to you by your health care provider. Make sure you discuss any questions you have with your health care provider. °  °Document Released: 05/04/2001 Document Revised: 05/09/2012 Document Reviewed: 01/03/2012 °Elsevier Interactive Patient Education ©2016 Elsevier Inc. ° °

## 2016-05-14 NOTE — ED Notes (Signed)
Abscess that is draining to back of left thigh

## 2016-05-25 ENCOUNTER — Encounter: Payer: Self-pay | Admitting: Emergency Medicine

## 2016-05-25 ENCOUNTER — Emergency Department
Admission: EM | Admit: 2016-05-25 | Discharge: 2016-05-25 | Disposition: A | Discharge status: Home / Self Care | Payer: Medicaid Other | Attending: Student | Admitting: Student

## 2016-05-25 DIAGNOSIS — Z79899 Other long term (current) drug therapy: Secondary | ICD-10-CM | POA: Diagnosis not present

## 2016-05-25 DIAGNOSIS — Z7984 Long term (current) use of oral hypoglycemic drugs: Secondary | ICD-10-CM | POA: Diagnosis not present

## 2016-05-25 DIAGNOSIS — F17200 Nicotine dependence, unspecified, uncomplicated: Secondary | ICD-10-CM | POA: Diagnosis not present

## 2016-05-25 DIAGNOSIS — L02416 Cutaneous abscess of left lower limb: Secondary | ICD-10-CM | POA: Diagnosis present

## 2016-05-25 DIAGNOSIS — Z794 Long term (current) use of insulin: Secondary | ICD-10-CM | POA: Diagnosis not present

## 2016-05-25 DIAGNOSIS — E1165 Type 2 diabetes mellitus with hyperglycemia: Secondary | ICD-10-CM | POA: Insufficient documentation

## 2016-05-25 DIAGNOSIS — I10 Essential (primary) hypertension: Secondary | ICD-10-CM | POA: Insufficient documentation

## 2016-05-25 DIAGNOSIS — J45909 Unspecified asthma, uncomplicated: Secondary | ICD-10-CM | POA: Diagnosis not present

## 2016-05-25 DIAGNOSIS — R7301 Impaired fasting glucose: Secondary | ICD-10-CM

## 2016-05-25 DIAGNOSIS — Z85828 Personal history of other malignant neoplasm of skin: Secondary | ICD-10-CM | POA: Insufficient documentation

## 2016-05-25 LAB — GLUCOSE, CAPILLARY: GLUCOSE-CAPILLARY: 285 mg/dL — AB (ref 65–99)

## 2016-05-25 MED ORDER — HYDROCODONE-ACETAMINOPHEN 5-325 MG PO TABS
1.0000 | ORAL_TABLET | Freq: Once | ORAL | Status: AC
  Administered 2016-05-25: 1 via ORAL
  Filled 2016-05-25: qty 1

## 2016-05-25 MED ORDER — SULFAMETHOXAZOLE-TRIMETHOPRIM 800-160 MG PO TABS: 1.0000 | ORAL_TABLET | Freq: Two times a day (BID) | ORAL | Status: DC

## 2016-05-25 MED ORDER — HYDROCODONE-ACETAMINOPHEN 5-325 MG PO TABS: 1.0000 | ORAL_TABLET | ORAL | Status: DC | PRN

## 2016-05-25 MED ORDER — LIDOCAINE HCL (PF) 1 % IJ SOLN
5.0000 mL | Freq: Once | INTRAMUSCULAR | Status: AC
  Administered 2016-05-25: 5 mL
  Filled 2016-05-25: qty 5

## 2016-05-25 NOTE — ED Notes (Signed)
Reports abscess to back of left thigh

## 2016-05-25 NOTE — Discharge Instructions (Signed)
Abscess An abscess (boil or furuncle) is an infected area on or under the skin. This area is filled with yellowish-white fluid (pus) and other material (debris). HOME CARE   Only take medicines as told by your doctor.  If you were given antibiotic medicine, take it as directed. Finish the medicine even if you start to feel better.  If gauze is used, follow your doctor's directions for changing the gauze.  To avoid spreading the infection:  Keep your abscess covered with a bandage.  Wash your hands well.  Do not share personal care items, towels, or whirlpools with others.  Avoid skin contact with others.  Keep your skin and clothes clean around the abscess.  Keep all doctor visits as told. GET HELP RIGHT AWAY IF:   You have more pain, puffiness (swelling), or redness in the wound site.  You have more fluid or blood coming from the wound site.  You have muscle aches, chills, or you feel sick.  You have a fever. MAKE SURE YOU:   Understand these instructions.  Will watch your condition.  Will get help right away if you are not doing well or get worse.   This information is not intended to replace advice given to you by your health care provider. Make sure you discuss any questions you have with your health care provider.   Document Released: 04/26/2008 Document Revised: 05/09/2012 Document Reviewed: 01/22/2012 Elsevier Interactive Patient Education [employer].    See your primary care doctor in 2 days for packing removal or return to the emergency room for removal. You will also need to contact your primary care doctor about your blood sugars. Today your reading was 285. Begin taking antibiotics until completely gone. Norco as needed for pain.

## 2016-05-25 NOTE — ED Notes (Signed)
States she was seen on 6/23 for abscess to same leg  States area was lanced at that time and placed on antibiotics.Marland Kitchenstates area has returned

## 2016-05-25 NOTE — ED Notes (Signed)
Dry dressing applied to left buttock area.Marland Kitchen

## 2016-05-25 NOTE — ED Provider Notes (Signed)
[facility] Emergency Department Provider Note   ____________________________________________  Time seen: Approximately 7:25 AM  I have reviewed the triage vital signs and the nursing notes.   HISTORY  Chief Complaint Abscess    HPI Brenda Hicks is a 50 y.o. female is here with complaint of abscess to her left leg. She states that she has been seen both in her PCPs office and in the emergency room for the same area. Patient states that she has been on antibiotics in the past and that it keeps recurring. Patient states she is diabetic and generally her blood sugars run around 110. She continues to take her insulin and metformin as directed. She states that she is also on Januvia. This morning she is fasting.Currently she rates her pain as 10 out of 10.   Past Medical History  Diagnosis Date  . Diabetes mellitus without complication ([place])   . Hypertension   . Asthma   . Cancer of ear     There are no active problems to display for this patient.   Past Surgical History  Procedure Laterality Date  . Knee surgery Right   . Hernia repair      Current Outpatient Rx  Name  Route  Sig  Dispense  Refill  . atorvastatin (LIPITOR) 40 MG tablet   Oral   Take 1 tablet (40 mg total) by mouth daily.   15 tablet   11   . gabapentin (NEURONTIN) 300 MG capsule   Oral   Take 300 mg by mouth 4 (four) times daily.         Marland Kitchen HYDROcodone-acetaminophen (NORCO/VICODIN) 5-325 MG tablet   Oral   Take 1 tablet by mouth every 4 (four) hours as needed for moderate pain.   20 tablet   0   . ibuprofen (ADVIL,MOTRIN) 800 MG tablet   Oral   Take 1 tablet (800 mg total) by mouth every 8 (eight) hours as needed for mild pain or moderate pain.   15 tablet   0   . insulin glargine (LANTUS) 100 UNIT/ML injection   Subcutaneous   Inject 95 Units into the skin at bedtime.         . metFORMIN (GLUCOPHAGE) 1000 MG tablet   Oral   Take 1,000 mg by mouth 2  (two) times daily with a meal.         . sitaGLIPtin (JANUVIA) 25 MG tablet   Oral   Take 25 mg by mouth daily.         Marland Kitchen sulfamethoxazole-trimethoprim (BACTRIM DS,SEPTRA DS) 800-160 MG tablet   Oral   Take 1 tablet by mouth 2 (two) times daily.   20 tablet   0     Allergies Review of patient's allergies indicates no known allergies.  No family history on file.  Social History Social History  Substance Use Topics  . Smoking status: Current Every Day Smoker  . Smokeless tobacco: None  . Alcohol Use: No    Review of Systems Constitutional: No fever/chills Cardiovascular: Denies chest pain. Respiratory: Denies shortness of breath. Gastrointestinal:   No nausea, no vomiting.   Skin: Positive for abscess. Neurological: Negative for headaches, focal weakness or numbness.  10-point ROS otherwise negative.  ____________________________________________   PHYSICAL EXAM:  VITAL SIGNS: ED Triage Vitals  Enc Vitals Group     BP 05/25/16 0713 142/99 mmHg     Pulse Rate 05/25/16 0713 98     Resp 05/25/16 0713 20  Temp 05/25/16 0713 98.1 F (36.7 C)     Temp Source 05/25/16 0713 Oral     SpO2 05/25/16 0713 98 %     Weight 05/25/16 0713 280 lb (127.007 kg)     Height 05/25/16 0713 5\' 5"  (1.651 m)     Head Cir --      Peak Flow --      Pain Score 05/25/16 0714 10     Pain Loc --      Pain Edu? --      Excl. in [place]? --     Constitutional: Alert and oriented. Well appearing and in no acute distress.Obese. Eyes: Conjunctivae are normal. PERRL. EOMI. Head: Atraumatic. Nose: No congestion/rhinnorhea. Neck: No stridor.   Cardiovascular: Normal rate, regular rhythm. Grossly normal heart sounds.  Good peripheral circulation. Respiratory: Normal respiratory effort.  No retractions. Lungs CTAB. Musculoskeletal: Moves upper and lower extremities without any difficulty. Normal gait was noted. Neurologic:  Normal speech and language. No gross focal neurologic deficits are  appreciated. No gait instability. Skin:  Skin is warm, dry. There is a 3 cm abscess formation on the posterior aspect of the left thigh with fluctuance. Psychiatric: Mood and affect are normal. Speech and behavior are normal.  ____________________________________________   LABS (all labs ordered are listed, but only abnormal results are displayed)  Labs Reviewed  GLUCOSE, CAPILLARY - Abnormal; Notable for the following:    Glucose-Capillary 285 (*)    All other components within normal limits  CBG MONITORING, ED    PROCEDURES  Procedure(s) performed: INCISION AND DRAINAGE Performed by: Johnn Hai Consent: Verbal consent obtained. Risks and benefits: risks, benefits and alternatives were discussed Type: abscess  Body area: Left posterior thigh  Anesthesia: local infiltration  Incision was made with a scalpel.  Local anesthetic: lidocaine 1 % without epinephrine  Anesthetic total: 2 ml  Complexity: complex Blunt dissection to break up loculations  Drainage: purulent  Drainage amount: Moderate   Packing material: 1/2 in iodoform gauze  Patient tolerance: Patient tolerated the procedure well with no immediate complications.    Procedures  Critical Care performed: No  ____________________________________________   INITIAL IMPRESSION / ASSESSMENT AND PLAN / ED COURSE  Pertinent labs & imaging results that were available during my care of the patient were reviewed by me and considered in my medical decision making (see chart for details).  Patient was given a prescription for Norco as needed for pain and Bactrim DS twice a day for 10 days. Patient is to call her primary care doctor in [facility] for instructions on her insulin as it was just adjusted and her blood sugar is 285.It is uncertain whether this is her true glucose reading or because of the infection currently. Patient is also to have packing removed in 2 days either in the emergency room or at her  primary care doctor. She is also given the name of the surgeon on call should she decide to have this excised. Patient is continue her regular medications at this time. ____________________________________________   FINAL CLINICAL IMPRESSION(S) / ED DIAGNOSES  Final diagnoses:  Abscess of left thigh  Fasting hyperglycemia      NEW MEDICATIONS STARTED DURING THIS VISIT:  Discharge Medication List as of 05/25/2016  8:14 AM    START taking these medications   Details  HYDROcodone-acetaminophen (NORCO/VICODIN) 5-325 MG tablet Take 1 tablet by mouth every 4 (four) hours as needed for moderate pain., Starting 05/25/2016, Until Discontinued, Print  Note:  This document was prepared using Dragon voice recognition software and may include unintentional dictation errors.    Johnn Hai, PA-C 05/25/16 [place], PA-C 05/25/16 [id number]  Joanne Gavel, MD 05/25/16 1538

## 2016-05-31 ENCOUNTER — Other Ambulatory Visit: Payer: Self-pay

## 2016-05-31 DIAGNOSIS — K219 Gastro-esophageal reflux disease without esophagitis: Secondary | ICD-10-CM | POA: Insufficient documentation

## 2016-05-31 DIAGNOSIS — G4733 Obstructive sleep apnea (adult) (pediatric): Secondary | ICD-10-CM | POA: Insufficient documentation

## 2016-05-31 DIAGNOSIS — E785 Hyperlipidemia, unspecified: Secondary | ICD-10-CM | POA: Insufficient documentation

## 2016-05-31 DIAGNOSIS — R87619 Unspecified abnormal cytological findings in specimens from cervix uteri: Secondary | ICD-10-CM | POA: Insufficient documentation

## 2016-05-31 DIAGNOSIS — I10 Essential (primary) hypertension: Secondary | ICD-10-CM | POA: Insufficient documentation

## 2016-06-01 ENCOUNTER — Ambulatory Visit: Payer: Self-pay | Admitting: Surgery

## 2016-06-01 ENCOUNTER — Ambulatory Visit (INDEPENDENT_AMBULATORY_CARE_PROVIDER_SITE_OTHER): Payer: Medicaid Other | Admitting: Surgery

## 2016-06-01 ENCOUNTER — Other Ambulatory Visit: Payer: Self-pay

## 2016-06-01 ENCOUNTER — Encounter: Payer: Self-pay | Admitting: Surgery

## 2016-06-01 VITALS — BP 146/74 | HR 91 | Temp 98.7°F | Ht 65.0 in | Wt 332.0 lb

## 2016-06-01 DIAGNOSIS — L723 Sebaceous cyst: Secondary | ICD-10-CM

## 2016-06-01 NOTE — Patient Instructions (Addendum)
[facility]- [phone] Hours: Mon - Fri, 8:30 to 4:30  Address: [address], [place], [place] [address]    You may shower normally with soap and water but do not submerge in a tub, hot tub, or pool until seen back in the office.  You will return to the office in 10 days to see the nurse for suture removal and wound check. (Please see your appointment below)  If you develop any redness, increased pain, drainage from the site, or fever >100.5 - Call our office and speak with a nurse immediately.

## 2016-06-01 NOTE — Procedures (Signed)
Sebaceous Cyst Excision Procedure Note  Pre-operative Diagnosis: 3cm Sebaceous cyst of posterior left thigh  Post-operative Diagnosis: same  Locations:posterior Left thigh  Indications: Multiple previous infections in the same area, now just recently healed from I&D  Anesthesia: Lidocaine 1% with epinephrine with added sodium bicarbonate  Procedure Details  History of allergy to iodine: no  Patient informed of the risks (including bleeding and infection) and benefits of the  procedure and Written informed consent obtained.  The lesion and surrounding area was given a sterile prep using betadyne and draped in the usual sterile fashion. An incision was made over the cyst, which was dissected free of the surrounding tissue and removed.  The cyst was filled with typical sebaceous material.  The wound was closed with 2-0 Nylon using interrupted mattress stitches. Antibiotic ointment and a sterile dressing applied.  The specimen was sent for pathologic examination. The patient tolerated the procedure well.  EBL: 10 ml  Findings: Sebaceous cyst with area of induration   Condition: Stable  Complications: none.  Plan: 1. Instructed to keep the wound dry and covered for 24-48h and clean thereafter. 2. Warning signs of infection were reviewed.   3. Recommended that the patient use OTC acetaminophen and OTC ibuprofen as needed for pain.  4. Return for suture removal in 10 days.

## 2016-06-01 NOTE — Progress Notes (Signed)
Subjective:     Patient ID: Brenda Hicks, female   DOB: 07-07-1966, 50 y.o.   MRN: [id number]  HPI  50 year old female with a past medical history of uncontrolled diabetes mellitus with sugars in the 250 range, morbid obesity, hyperlipidemia, arthritic knees and hypertension comes in with a complaint of a left posterior thigh abscess that continues to reoccur. The patient states that the first time she had it was back in December and she had implants in the emergency department. Patient states that after that resolved she then had a recurrence back on June 23 which again she had to have an incision and drainage. Again on July 4 the area got big red swollen again and she stated that it was about the size of a baseball that time. Seen in the emergency department and have this area lanced and she is now off of antibiotics for this. Patient states that the area still remains slightly sore and warm to the touch however no redness and no current drainage from the area. Patient does not feel as if the area is getting worse associates that there is a slight bump in the area. Patient denies any fever chills nausea vomiting abdominal pain diarrhea constipation or dysuria. Patient has had multiple boils and cysts before but this is the only area where it has had to be lanced.   Past Medical History  Diagnosis Date  . Diabetes mellitus without complication ([place])   . Hypertension   . Asthma   . Cancer of ear    Past Surgical History  Procedure Laterality Date  . Knee surgery Right   . Hernia repair     History reviewed. No pertinent family history. Social History   Social History  . Marital Status: Married    Spouse Name: N/A  . Number of Children: N/A  . Years of Education: N/A   Social History Main Topics  . Smoking status: Current Every Day Smoker  . Smokeless tobacco: None  . Alcohol Use: No  . Drug Use: No  . Sexual Activity: Yes    Birth Control/ Protection: IUD   Other Topics Concern   . None   Social History Narrative    Current outpatient prescriptions:  .  ACCU-CHEK SOFTCLIX LANCETS lancets, , Disp: , Rfl:  .  albuterol (PROAIR HFA) 108 (90 Base) MCG/ACT inhaler, Inhale into the lungs., Disp: , Rfl:  .  aspirin EC 81 MG tablet, Take by mouth., Disp: , Rfl:  .  atorvastatin (LIPITOR) 40 MG tablet, Take 1 tablet (40 mg total) by mouth daily., Disp: 15 tablet, Rfl: 11 .  Blood Glucose Monitoring Suppl (FIFTY50 GLUCOSE METER 2.0) w/Device KIT, , Disp: , Rfl:  .  Blood Glucose Monitoring Suppl (RA BLOOD GLUCOSE MONITOR) DEVI, , Disp: , Rfl:  .  gabapentin (NEURONTIN) 300 MG capsule, Take 300 mg by mouth 4 (four) times daily., Disp: , Rfl:  .  glipiZIDE (GLUCOTROL XL) 2.5 MG 24 hr tablet, , Disp: , Rfl:  .  glucose blood test strip, , Disp: , Rfl:  .  ibuprofen (ADVIL,MOTRIN) 800 MG tablet, Take 1 tablet (800 mg total) by mouth every 8 (eight) hours as needed for mild pain or moderate pain., Disp: 15 tablet, Rfl: 0 .  insulin glargine (LANTUS) 100 UNIT/ML injection, Inject 95 Units into the skin at bedtime., Disp: , Rfl:  .  insulin lispro (HUMALOG) 100 UNIT/ML injection, Inject into the skin., Disp: , Rfl:  .  Insulin Pen Needle (FIFTY50  PEN NEEDLES) 31G X 5 MM MISC, , Disp: , Rfl:  .  Insulin Syringe-Needle U-100 (INSULIN SYRINGE .5CC/30GX5/16") 30G X 5/16" 0.5 ML MISC, , Disp: , Rfl:  .  lisinopril (PRINIVIL,ZESTRIL) 40 MG tablet, Take by mouth., Disp: , Rfl:  .  metFORMIN (GLUCOPHAGE) 1000 MG tablet, Take 1,000 mg by mouth 2 (two) times daily with a meal., Disp: , Rfl:  .  montelukast (SINGULAIR) 10 MG tablet, Take by mouth., Disp: , Rfl:  .  mupirocin ointment (BACTROBAN) 2 %, , Disp: , Rfl:  .  omeprazole (PRILOSEC) 20 MG capsule, Take by mouth., Disp: , Rfl:  .  sitaGLIPtin (JANUVIA) 25 MG tablet, Take 25 mg by mouth daily., Disp: , Rfl:  .  traZODone (DESYREL) 100 MG tablet, , Disp: , Rfl:  No Known Allergies    Review of Systems  Constitutional: Positive for  activity change. Negative for fever, chills and fatigue.  HENT: Negative for congestion and sore throat.   Respiratory: Negative for cough, shortness of breath, wheezing and stridor.   Cardiovascular: Negative for chest pain, palpitations and leg swelling.  Gastrointestinal: Negative for nausea, vomiting, abdominal pain, diarrhea, constipation and abdominal distention.  Genitourinary: Negative for dysuria and urgency.  Musculoskeletal: Positive for joint swelling. Negative for back pain.  Skin: Positive for color change and wound.  Neurological: Negative for dizziness and weakness.  Hematological: Negative for adenopathy. Does not bruise/bleed easily.  Psychiatric/Behavioral: Negative for agitation.  All other systems reviewed and are negative.      Filed Vitals:   06/01/16 1446  BP: 146/74  Pulse: 91  Temp: 98.7 F (37.1 C)    Objective:   Physical Exam  Constitutional: She is oriented to person, place, and time. She appears well-developed and well-nourished. No distress.  HENT:  Head: Normocephalic and atraumatic.  Right Ear: External ear normal.  Left Ear: External ear normal.  Nose: Nose normal.  Mouth/Throat: Oropharynx is clear and moist. No oropharyngeal exudate.  Eyes: Conjunctivae and EOM are normal. Pupils are equal, round, and reactive to light. No scleral icterus.  Neck: Normal range of motion. Neck supple. No tracheal deviation present.  Cardiovascular: Normal rate, regular rhythm, normal heart sounds and intact distal pulses.  Exam reveals no gallop and no friction rub.   No murmur heard. Pulmonary/Chest: Effort normal and breath sounds normal. No respiratory distress. She has no wheezes. She has no rales.  Abdominal: Soft. Bowel sounds are normal. She exhibits no distension. There is no tenderness.  obese  Musculoskeletal: Normal range of motion. She exhibits no edema or tenderness.  Neurological: She is alert and oriented to person, place, and time. No cranial  nerve deficit.  Skin: Skin is warm and dry. No erythema.  Left posterior thigh, 3cm area of induration with some hyperemic chronic skin changes surrounding area, no erythema or drainage, some tenderness  Psychiatric: She has a normal mood and affect. Her behavior is normal. Judgment and thought content normal.  Vitals reviewed.      Assessment:     50 yr old female with sebaceous cyst to left thigh that continues to get infected.     Plan:     Reviewed the patient's past medical history including her morbid obesity, diabetes and her worsening hyperglycemia with the need to lancets this area in December June and last week. I viewed her laboratory values which did show some hyperglycemia around 280.     I discussed with the patient her tobacco use as well and  spent 15 minutes in conversation discussing the different medications that could be used to help. Patient has tried Chantix before which made her sick. She has not ever tried wellbutrin. She did use some patches after knee surgery and would be willing to use the patches or the gum again to help her. Patient has moved from smoking 2-1/2 packs a day to now only smoking about 10 cigarettes a day.   I also discussed her morbid obesity. The patient states that she was about 600 pounds and has lost down to 330 lbs but for the past year even though having good diet practices she has not been able to move below this weight.   I discussed with her being seen in the weight management Center to help her with medical weight management and to put her on strict plan and then she could also be eligible potentially for bariatric surgery if that was an option she chose.  Additionally I discussed her left thigh abscess which is likely a sebaceous cyst this continued to get infected periodically which is easier to do with her diabetes and smoking which make it harder to heal. I discussed with the patient that having this area excised comes with the risk of  bleeding, infection, damage to structures in the area and of potential recurrence.  I also discuss with the patient that if it's not excised do fill this continued continue to get infected periodically.  She was given opportunity to ask questions and have them answered and signed a consent to have an excision of her left thigh sebaceous cyst.

## 2016-06-02 ENCOUNTER — Telehealth: Payer: Self-pay | Admitting: Surgery

## 2016-06-02 NOTE — Telephone Encounter (Signed)
Patient had a cyst removed yesterday in the office and was told to take tylenol. She stated it isn't strong enough and has been up all night. Can you give her something else? Please call.

## 2016-06-02 NOTE — Telephone Encounter (Signed)
Spoke with patient and she was instructed to use ibuprofen every 4-6 hours as need for the discomfort and an ice pack as well. Patient denies fever, no redness or unusual drainage from the site. She verbalized understanding.

## 2016-06-07 ENCOUNTER — Encounter: Payer: Self-pay | Admitting: Surgery

## 2016-06-07 ENCOUNTER — Telehealth: Payer: Self-pay

## 2016-06-07 NOTE — Telephone Encounter (Signed)
Reviewed Pathology with patient. Reviewed scheduled appointment details for suture removal this coming Friday in [place]. Patient confirmed appointment.

## 2016-06-11 ENCOUNTER — Ambulatory Visit (INDEPENDENT_AMBULATORY_CARE_PROVIDER_SITE_OTHER): Payer: Medicaid Other

## 2016-06-11 DIAGNOSIS — Z4802 Encounter for removal of sutures: Secondary | ICD-10-CM

## 2016-06-11 NOTE — Progress Notes (Signed)
Patient came in to the office to have her sutures removed. Patient was not having any redness or drainage on her incision. Patient also stated that she had not had any fever or chills and was feeling well. Patient had no questions or concerns. I told patient to give [place] a call if she noticed any redness, fever, chills, or drainage on her incision area. Patient understood.

## 2016-07-26 ENCOUNTER — Encounter: Payer: Self-pay | Admitting: Medical Oncology

## 2016-07-26 ENCOUNTER — Emergency Department
Admission: EM | Admit: 2016-07-26 | Discharge: 2016-07-26 | Disposition: A | Discharge status: Home / Self Care | Payer: Medicaid Other | Attending: Emergency Medicine | Admitting: Emergency Medicine

## 2016-07-26 DIAGNOSIS — Z96651 Presence of right artificial knee joint: Secondary | ICD-10-CM | POA: Insufficient documentation

## 2016-07-26 DIAGNOSIS — Z7982 Long term (current) use of aspirin: Secondary | ICD-10-CM | POA: Insufficient documentation

## 2016-07-26 DIAGNOSIS — K611 Rectal abscess: Secondary | ICD-10-CM | POA: Diagnosis not present

## 2016-07-26 DIAGNOSIS — Z8522 Personal history of malignant neoplasm of nasal cavities, middle ear, and accessory sinuses: Secondary | ICD-10-CM | POA: Diagnosis not present

## 2016-07-26 DIAGNOSIS — Z7984 Long term (current) use of oral hypoglycemic drugs: Secondary | ICD-10-CM | POA: Diagnosis not present

## 2016-07-26 DIAGNOSIS — Z794 Long term (current) use of insulin: Secondary | ICD-10-CM | POA: Diagnosis not present

## 2016-07-26 DIAGNOSIS — J45909 Unspecified asthma, uncomplicated: Secondary | ICD-10-CM | POA: Diagnosis not present

## 2016-07-26 DIAGNOSIS — E119 Type 2 diabetes mellitus without complications: Secondary | ICD-10-CM | POA: Diagnosis not present

## 2016-07-26 DIAGNOSIS — F17200 Nicotine dependence, unspecified, uncomplicated: Secondary | ICD-10-CM | POA: Insufficient documentation

## 2016-07-26 DIAGNOSIS — N390 Urinary tract infection, site not specified: Secondary | ICD-10-CM | POA: Diagnosis not present

## 2016-07-26 DIAGNOSIS — Z85828 Personal history of other malignant neoplasm of skin: Secondary | ICD-10-CM | POA: Diagnosis not present

## 2016-07-26 DIAGNOSIS — K6289 Other specified diseases of anus and rectum: Secondary | ICD-10-CM | POA: Diagnosis present

## 2016-07-26 DIAGNOSIS — I10 Essential (primary) hypertension: Secondary | ICD-10-CM | POA: Diagnosis not present

## 2016-07-26 DIAGNOSIS — K612 Anorectal abscess: Secondary | ICD-10-CM

## 2016-07-26 LAB — URINALYSIS COMPLETE WITH MICROSCOPIC (ARMC ONLY)
BILIRUBIN URINE: NEGATIVE
Glucose, UA: 50 mg/dL — AB
KETONES UR: NEGATIVE mg/dL
NITRITE: NEGATIVE
Protein, ur: 30 mg/dL — AB
Specific Gravity, Urine: 1.02 (ref 1.005–1.030)
pH: 5 (ref 5.0–8.0)

## 2016-07-26 MED ORDER — OXYCODONE-ACETAMINOPHEN 5-325 MG PO TABS
2.0000 | ORAL_TABLET | Freq: Once | ORAL | Status: AC
  Administered 2016-07-26: 2 via ORAL
  Filled 2016-07-26: qty 2

## 2016-07-26 MED ORDER — KETOROLAC TROMETHAMINE 60 MG/2ML IM SOLN
60.0000 mg | Freq: Once | INTRAMUSCULAR | Status: AC
  Administered 2016-07-26: 60 mg via INTRAMUSCULAR
  Filled 2016-07-26: qty 2

## 2016-07-26 MED ORDER — IBUPROFEN 800 MG PO TABS: 800.0000 mg | ORAL_TABLET | Freq: Three times a day (TID) | ORAL | 0 refills | Status: DC | PRN

## 2016-07-26 MED ORDER — SULFAMETHOXAZOLE-TRIMETHOPRIM 800-160 MG PO TABS: 1.0000 | ORAL_TABLET | Freq: Two times a day (BID) | ORAL | 0 refills | Status: DC

## 2016-07-26 MED ORDER — OXYCODONE-ACETAMINOPHEN 5-325 MG PO TABS: 1.0000 | ORAL_TABLET | ORAL | 0 refills | Status: DC | PRN

## 2016-07-26 NOTE — ED Triage Notes (Signed)
Pt reports rectal pain with possible Hemorid. Pt denies bleeding. Pt reports she is also having lower back pain and bilt hip pain. Pt ambulatory.

## 2016-07-26 NOTE — ED Provider Notes (Signed)
[facility] Emergency Department Provider Note  ____________________________________________  Time seen: Approximately 8:12 AM  I have reviewed the triage vital signs and the nursing notes.   HISTORY  Chief Complaint Rectal Pain; Back Pain; and Hip Pain    HPI Brenda Hicks is a 50 y.o. female presents for evaluation of rectal pain with possible hemorrhoid. Patient denies any bleeding at this time. Patient initially was having lower back pain and bilateral hip pain.Patient reports pressure when she tries to have a bowel movement with pain around the anal verge itself. Denies any fever chills. Denies any history of constipation.   Past Medical History:  Diagnosis Date  . Asthma   . Cancer of ear   . Diabetes mellitus without complication ([place])   . Hypertension     Patient Active Problem List   Diagnosis Date Noted  . Sebaceous cyst 06/01/2016  . Obstructive apnea 05/31/2016  . HLD (hyperlipidemia) 05/31/2016  . Acid reflux 05/31/2016  . Essential (primary) hypertension 05/31/2016  . Abnormal Pap smear of cervix 05/31/2016  . Arthritis of knee, degenerative 07/25/2015  . History of artificial joint 07/25/2015  . Gonalgia 02/17/2015  . Type 2 diabetes mellitus ([place]) 10/23/2014  . Otalgia of left ear 09/24/2014  . 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 Diabetic peripheral neuropathy associated with type 2 diabetes mellitus ([place]) 05/16/2014  . Endometrial polyp 11/01/2013  . Fibroids, intramural 10/09/2013  . Pain due to knee joint prosthesis ([place]) 10/05/2013  . Body mass index (BMI) of 50-59.9 in adult ([place]) 10/03/2013  . Abnormal uterine bleeding 10/03/2013  . Adiposity 10/01/2013  . Excessive and frequent menstruation with irregular cycle 10/01/2013  . Adaptive colitis 10/01/2013  . History of migraine headaches 10/01/2013  . H/O malignant neoplasm of skin  10/01/2013  . Fatty liver disease, nonalcoholic 55/20/8022  . Diverticulitis 10/01/2013  . Current tobacco use 08/29/2013  . H/O total knee replacement 08/29/2013  . Cannot sleep 08/29/2013  . Dysmenorrhea 08/29/2013  . Airway hyperreactivity 08/29/2013  . Absolute anemia 08/29/2013  . Tobacco use 08/29/2013    Past Surgical History:  Procedure Laterality Date  . HERNIA REPAIR    . KNEE SURGERY Right     Prior to Admission medications   Medication Sig Start Date End Date Taking? Authorizing Provider  ACCU-CHEK SOFTCLIX LANCETS lancets  09/12/13   Historical Provider, MD  albuterol (PROAIR HFA) 108 (90 Base) MCG/ACT inhaler Inhale into the lungs. 01/14/16 01/13/17  Historical Provider, MD  aspirin EC 81 MG tablet Take by mouth. 01/14/16   Historical Provider, MD  atorvastatin (LIPITOR) 40 MG tablet Take 1 tablet (40 mg total) by mouth daily. 12/07/15 12/06/16  Johnn Hai, PA-C  Blood Glucose Monitoring Suppl (FIFTY50 GLUCOSE METER 2.0) w/Device KIT  06/24/15   Historical Provider, MD  Blood Glucose Monitoring Suppl (RA BLOOD GLUCOSE MONITOR) DEVI  06/24/15   Historical Provider, MD  gabapentin (NEURONTIN) 300 MG capsule Take 300 mg by mouth 4 (four) times daily.    Historical Provider, MD  glipiZIDE (GLUCOTROL XL) 2.5 MG 24 hr tablet  05/31/14   Historical Provider, MD  glucose blood test strip  05/16/14   Historical Provider, MD  ibuprofen (ADVIL,MOTRIN) 800 MG tablet Take 1 tablet (800 mg total) by mouth every 8 (eight) hours as needed. 07/26/16   Pierce Crane Omaira Mellen, PA-C  insulin glargine (LANTUS) 100 UNIT/ML injection Inject 95 Units into the skin at bedtime.  Historical Provider, MD  insulin lispro (HUMALOG) 100 UNIT/ML injection Inject into the skin. 01/14/16 01/13/17  Historical Provider, MD  Insulin Syringe-Needle U-100 (INSULIN SYRINGE .5CC/30GX5/16") 30G X 5/16" 0.5 ML MISC  01/15/16   Historical Provider, MD  lisinopril (PRINIVIL,ZESTRIL) 40 MG tablet Take by mouth. 01/14/16 01/13/17   Historical Provider, MD  metFORMIN (GLUCOPHAGE) 1000 MG tablet Take 1,000 mg by mouth 2 (two) times daily with a meal.    Historical Provider, MD  montelukast (SINGULAIR) 10 MG tablet Take by mouth. 01/14/16 01/13/17  Historical Provider, MD  mupirocin ointment (BACTROBAN) 2 %  04/09/14   Historical Provider, MD  omeprazole (PRILOSEC) 20 MG capsule Take by mouth. 01/14/16 01/13/17  Historical Provider, MD  oxyCODONE-acetaminophen (ROXICET) 5-325 MG tablet Take 1-2 tablets by mouth every 4 (four) hours as needed for severe pain. 07/26/16   Pierce Crane Elba Dendinger, PA-C  sitaGLIPtin (JANUVIA) 25 MG tablet Take 25 mg by mouth daily.    Historical Provider, MD  sulfamethoxazole-trimethoprim (BACTRIM DS,SEPTRA DS) 800-160 MG tablet Take 1 tablet by mouth 2 (two) times daily. 07/26/16   Arlyss Repress, PA-C  traZODone (DESYREL) 100 MG tablet  06/22/14   Historical Provider, MD    Allergies Review of patient's allergies indicates no known allergies.  No family history on file.  Social History Social History  Substance Use Topics  . Smoking status: Current Every Day Smoker  . Smokeless tobacco: Not on file  . Alcohol use No    Review of Systems Constitutional: No fever/chills Cardiovascular: Denies chest pain. Respiratory: Denies shortness of breath. Gastrointestinal: No abdominal pain.  No nausea, no vomiting.  No diarrhea.  No constipation. Genitourinary: Negative for dysuria.Positive for anal pain. Musculoskeletal: Negative for back pain. Skin: Negative for rash. Neurological: Negative for headaches, focal weakness or numbness.  10-point ROS otherwise negative.  ____________________________________________   PHYSICAL EXAM:  VITAL SIGNS: ED Triage Vitals [07/26/16 0808]  Enc Vitals Group     BP (!) 162/103     Pulse Rate 90     Resp 19     Temp 98.3 F (36.8 C)     Temp Source Oral     SpO2 98 %     Weight (!) 324 lb (147 kg)     Height '5\' 5"'  (1.651 m)     Head Circumference      Peak  Flow      Pain Score 10     Pain Loc      Pain Edu?      Excl. in [place]?     Constitutional: Alert and oriented. Well appearing and in no acute distress. Cardiovascular: Normal rate, regular rhythm. Grossly normal heart sounds.  Good peripheral circulation. Respiratory: Normal respiratory effort.  No retractions. Lungs CTAB. Gastrointestinal: Soft and nontender. No distention. Morbidly obese. No CVA tenderness. Genitourinary/Rectal: No obvious external hemorrhoids. Increased pressure and pain around the anal verge itself. No irritation or mass noted. Musculoskeletal: No lower extremity tenderness nor edema.  No joint effusions. Neurologic:  Normal speech and language. No gross focal neurologic deficits are appreciated. No gait instability. Skin:  Skin is warm, dry and intact. No rash noted. Psychiatric: Mood and affect are normal. Speech and behavior are normal.  ____________________________________________   LABS (all labs ordered are listed, but only abnormal results are displayed)  Labs Reviewed  URINALYSIS COMPLETEWITH MICROSCOPIC ([place] ONLY) - Abnormal; Notable for the following:       Result Value   Color, Urine YELLOW (*)  APPearance HAZY (*)    Glucose, UA 50 (*)    Hgb urine dipstick 1+ (*)    Protein, ur 30 (*)    Leukocytes, UA TRACE (*)    Bacteria, UA RARE (*)    Squamous Epithelial / LPF 6-30 (*)    All other components within normal limits   ____________________________________________  EKG   ____________________________________________  RADIOLOGY   ____________________________________________   PROCEDURES  Procedure(s) performed: None  Critical Care performed: No  ____________________________________________   INITIAL IMPRESSION / ASSESSMENT AND PLAN / ED COURSE  Pertinent labs & imaging results that were available during my care of the patient were reviewed by me and considered in my medical decision making (see chart for details). Review  of the [place] CSRS was performed in accordance of the [place] prior to dispensing any controlled drugs.  Possible early and abscess. No obvious hemorrhoids. We'll treat prophylactically with antibiotics 10 days and pain medication. Patient to follow-up with her PCP or return to ER with any worsening symptomology. Patient is a diabetic.  Clinical Course    ____________________________________________   FINAL CLINICAL IMPRESSION(S) / ED DIAGNOSES  Final diagnoses:  UTI (lower urinary tract infection)  Abscess of anal and rectal regions     This chart was dictated using voice recognition software/Dragon. Despite best efforts to proofread, errors can occur which can change the meaning. Any change was purely unintentional.    Arlyss Repress, PA-C 07/26/16 9826    Lisa Roca, MD 07/26/16 [phone]

## 2016-07-26 NOTE — ED Notes (Signed)
States she developed pain and swelling around rectal area on Saturday. Denies any urinary sx's or blood in stools. Area sl swollen and very tender to touch on exam

## 2016-08-10 ENCOUNTER — Emergency Department
Admission: EM | Admit: 2016-08-10 | Discharge: 2016-08-10 | Disposition: A | Discharge status: Home / Self Care | Payer: Medicaid Other | Attending: Emergency Medicine | Admitting: Emergency Medicine

## 2016-08-10 ENCOUNTER — Emergency Department: Payer: Medicaid Other

## 2016-08-10 ENCOUNTER — Encounter: Payer: Self-pay | Admitting: Emergency Medicine

## 2016-08-10 DIAGNOSIS — Z96651 Presence of right artificial knee joint: Secondary | ICD-10-CM | POA: Insufficient documentation

## 2016-08-10 DIAGNOSIS — M25512 Pain in left shoulder: Secondary | ICD-10-CM

## 2016-08-10 DIAGNOSIS — Z7982 Long term (current) use of aspirin: Secondary | ICD-10-CM | POA: Diagnosis not present

## 2016-08-10 DIAGNOSIS — S4992XA Unspecified injury of left shoulder and upper arm, initial encounter: Secondary | ICD-10-CM | POA: Diagnosis present

## 2016-08-10 DIAGNOSIS — Z794 Long term (current) use of insulin: Secondary | ICD-10-CM | POA: Diagnosis not present

## 2016-08-10 DIAGNOSIS — Y999 Unspecified external cause status: Secondary | ICD-10-CM | POA: Diagnosis not present

## 2016-08-10 DIAGNOSIS — Y9389 Activity, other specified: Secondary | ICD-10-CM | POA: Diagnosis not present

## 2016-08-10 DIAGNOSIS — X501XXA Overexertion from prolonged static or awkward postures, initial encounter: Secondary | ICD-10-CM | POA: Diagnosis not present

## 2016-08-10 DIAGNOSIS — F17200 Nicotine dependence, unspecified, uncomplicated: Secondary | ICD-10-CM | POA: Insufficient documentation

## 2016-08-10 DIAGNOSIS — J45909 Unspecified asthma, uncomplicated: Secondary | ICD-10-CM | POA: Insufficient documentation

## 2016-08-10 DIAGNOSIS — I10 Essential (primary) hypertension: Secondary | ICD-10-CM | POA: Diagnosis not present

## 2016-08-10 DIAGNOSIS — Z7984 Long term (current) use of oral hypoglycemic drugs: Secondary | ICD-10-CM | POA: Insufficient documentation

## 2016-08-10 DIAGNOSIS — E119 Type 2 diabetes mellitus without complications: Secondary | ICD-10-CM | POA: Insufficient documentation

## 2016-08-10 DIAGNOSIS — Z85828 Personal history of other malignant neoplasm of skin: Secondary | ICD-10-CM | POA: Diagnosis not present

## 2016-08-10 DIAGNOSIS — Z8522 Personal history of malignant neoplasm of nasal cavities, middle ear, and accessory sinuses: Secondary | ICD-10-CM | POA: Diagnosis not present

## 2016-08-10 DIAGNOSIS — Y929 Unspecified place or not applicable: Secondary | ICD-10-CM | POA: Insufficient documentation

## 2016-08-10 MED ORDER — MORPHINE SULFATE (PF) 4 MG/ML IV SOLN
4.0000 mg | Freq: Once | INTRAVENOUS | Status: AC
  Administered 2016-08-10: 4 mg via INTRAVENOUS

## 2016-08-10 MED ORDER — MORPHINE SULFATE (PF) 4 MG/ML IV SOLN
INTRAVENOUS | Status: AC
  Administered 2016-08-10: 4 mg via INTRAVENOUS
  Filled 2016-08-10: qty 1

## 2016-08-10 MED ORDER — HYDROCODONE-ACETAMINOPHEN 5-325 MG PO TABS: 1.0000 | ORAL_TABLET | ORAL | 0 refills | Status: DC | PRN

## 2016-08-10 NOTE — ED Provider Notes (Signed)
[facility] Emergency Department Provider Note  Time seen: 2:34 PM  I have reviewed the triage vital signs and the nursing notes.   HISTORY  Chief Complaint Dislocation (left shoulder)    HPI Brenda Hicks is a 50 y.o. female with a past medical history of diabetes, hypertension, obesity, who presents the emergency department with left shoulder pain. According to the patient she was attempting to get off her bed she pushed down with her arms and felt a pop in the left shoulder with immediate pain to the shoulder. Patient states the pain did not go away so she came to the emergency department for evaluation. States the pain is moderate constant pain was sharp pain with any attempted movement. Patient denies any trauma to the shoulder no falls, no prior history of dislocation.  Past Medical History:  Diagnosis Date  . Asthma   . Cancer of ear   . Diabetes mellitus without complication ([place])   . Hypertension     Patient Active Problem List   Diagnosis Date Noted  . Sebaceous cyst 06/01/2016  . Obstructive apnea 05/31/2016  . HLD (hyperlipidemia) 05/31/2016  . Acid reflux 05/31/2016  . Essential (primary) hypertension 05/31/2016  . Abnormal Pap smear of cervix 05/31/2016  . Arthritis of knee, degenerative 07/25/2015  . History of artificial joint 07/25/2015  . Gonalgia 02/17/2015  . Type 2 diabetes mellitus ([place]) 10/23/2014  . Otalgia of left ear 09/24/2014  . 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 Diabetic peripheral neuropathy associated with type 2 diabetes mellitus ([place]) 05/16/2014  . Endometrial polyp 11/01/2013  . Fibroids, intramural 10/09/2013  . Pain due to knee joint prosthesis ([place]) 10/05/2013  . Body mass index (BMI) of 50-59.9 in adult ([place]) 10/03/2013  . Abnormal uterine bleeding 10/03/2013  . Adiposity 10/01/2013  . Excessive and frequent  menstruation with irregular cycle 10/01/2013  . Adaptive colitis 10/01/2013  . History of migraine headaches 10/01/2013  . H/O malignant neoplasm of skin 10/01/2013  . Fatty liver disease, nonalcoholic 02/63/7858  . Diverticulitis 10/01/2013  . Current tobacco use 08/29/2013  . H/O total knee replacement 08/29/2013  . Cannot sleep 08/29/2013  . Dysmenorrhea 08/29/2013  . Airway hyperreactivity 08/29/2013  . Absolute anemia 08/29/2013  . Tobacco use 08/29/2013    Past Surgical History:  Procedure Laterality Date  . HERNIA REPAIR    . KNEE SURGERY Right     Prior to Admission medications   Medication Sig Start Date End Date Taking? Authorizing Provider  ACCU-CHEK SOFTCLIX LANCETS lancets  09/12/13   Historical Provider, MD  albuterol (PROAIR HFA) 108 (90 Base) MCG/ACT inhaler Inhale into the lungs. 01/14/16 01/13/17  Historical Provider, MD  aspirin EC 81 MG tablet Take by mouth. 01/14/16   Historical Provider, MD  atorvastatin (LIPITOR) 40 MG tablet Take 1 tablet (40 mg total) by mouth daily. 12/07/15 12/06/16  Johnn Hai, PA-C  Blood Glucose Monitoring Suppl (FIFTY50 GLUCOSE METER 2.0) w/Device KIT  06/24/15   Historical Provider, MD  Blood Glucose Monitoring Suppl (RA BLOOD GLUCOSE MONITOR) DEVI  06/24/15   Historical Provider, MD  gabapentin (NEURONTIN) 300 MG capsule Take 300 mg by mouth 4 (four) times daily.    Historical Provider, MD  glipiZIDE (GLUCOTROL XL) 2.5 MG 24 hr tablet  05/31/14   Historical Provider, MD  glucose blood test strip  05/16/14   Historical Provider, MD  ibuprofen (ADVIL,MOTRIN) 800 MG tablet Take 1  tablet (800 mg total) by mouth every 8 (eight) hours as needed. 07/26/16   Pierce Crane Beers, PA-C  insulin glargine (LANTUS) 100 UNIT/ML injection Inject 95 Units into the skin at bedtime.    Historical Provider, MD  insulin lispro (HUMALOG) 100 UNIT/ML injection Inject into the skin. 01/14/16 01/13/17  Historical Provider, MD  Insulin Syringe-Needle U-100 (INSULIN  SYRINGE .5CC/30GX5/16") 30G X 5/16" 0.5 ML MISC  01/15/16   Historical Provider, MD  lisinopril (PRINIVIL,ZESTRIL) 40 MG tablet Take by mouth. 01/14/16 01/13/17  Historical Provider, MD  metFORMIN (GLUCOPHAGE) 1000 MG tablet Take 1,000 mg by mouth 2 (two) times daily with a meal.    Historical Provider, MD  montelukast (SINGULAIR) 10 MG tablet Take by mouth. 01/14/16 01/13/17  Historical Provider, MD  mupirocin ointment (BACTROBAN) 2 %  04/09/14   Historical Provider, MD  omeprazole (PRILOSEC) 20 MG capsule Take by mouth. 01/14/16 01/13/17  Historical Provider, MD  oxyCODONE-acetaminophen (ROXICET) 5-325 MG tablet Take 1-2 tablets by mouth every 4 (four) hours as needed for severe pain. 07/26/16   Pierce Crane Beers, PA-C  sitaGLIPtin (JANUVIA) 25 MG tablet Take 25 mg by mouth daily.    Historical Provider, MD  sulfamethoxazole-trimethoprim (BACTRIM DS,SEPTRA DS) 800-160 MG tablet Take 1 tablet by mouth 2 (two) times daily. 07/26/16   Arlyss Repress, PA-C  traZODone (DESYREL) 100 MG tablet  06/22/14   Historical Provider, MD    No Known Allergies  No family history on file.  Social History Social History  Substance Use Topics  . Smoking status: Current Every Day Smoker  . Smokeless tobacco: Never Used  . Alcohol use No    Review of Systems Constitutional: Negative for fever. Cardiovascular: Negative for chest pain. Respiratory: Negative for shortness of breath. Gastrointestinal: Negative for abdominal pain Musculoskeletal: Left shoulder pain Skin: Negative for rash. Neurological: Negative for headache 10-point ROS otherwise negative.  ____________________________________________   PHYSICAL EXAM:  VITAL SIGNS: ED Triage Vitals  Enc Vitals Group     BP 08/10/16 1417 (!) 146/96     Pulse Rate 08/10/16 1417 88     Resp 08/10/16 1417 (!) 22     Temp 08/10/16 1417 98.3 F (36.8 C)     Temp Source 08/10/16 1417 Oral     SpO2 08/10/16 1417 97 %     Weight 08/10/16 1418 (!) 324 lb (147 kg)      Height 08/10/16 1418 _0  (1.651 m)     Head Circumference --      Peak Flow --      Pain Score 08/10/16 1418 10     Pain Loc --      Pain Edu? --      Excl. in [place]? --     Constitutional: Alert and oriented. Well appearing and in no distress. Eyes: Normal exam ENT   Head: Normocephalic and atraumatic.   Mouth/Throat: Mucous membranes are moist. Cardiovascular: Normal rate, regular rhythm. No murmur Respiratory: Normal respiratory effort without tachypnea nor retractions. Breath sounds are clear  Gastrointestinal: Soft and nontender. No distention.   Musculoskeletal: Moderate tenderness palpation of the left shoulder, left bicep tricep. Good range of motion in the elbow and wrist. Good grip strength. Sensation intact. 2+ radial pulse. Neurologic:  Normal speech and language. No gross focal neurologic deficits Skin:  Skin is warm, dry and intact.  Psychiatric: Mood and affect are normal.   ____________________________________________     RADIOLOGY  X-ray negative  ____________________________________________   INITIAL IMPRESSION /  ASSESSMENT AND PLAN / ED COURSE  Pertinent labs & imaging results that were available during my care of the patient were reviewed by me and considered in my medical decision making (see chart for details).  Patient denies the emergency department with left shoulder pain. Patient has moderate tenderness palpation. Suspect likely ligamentous/tendon injury versus dislocation. Given body habitus it is not clear if there is any deformity or dislocation. No obvious deformity noted. We will obtain an x-ray for further evaluation.  X-ray negative. We'll place the patient in a sling and discharged with pain medication and orthopedic follow-up. Patient agreeable to plan.  ____________________________________________   FINAL CLINICAL IMPRESSION(S) / ED DIAGNOSES  Left shoulder pain    Harvest Dark, MD 08/10/16 1524

## 2016-08-10 NOTE — ED Notes (Signed)
D/c inst to pt.   

## 2016-08-10 NOTE — ED Triage Notes (Signed)
Pt to ED via EMS due to left shoulder pain.  Pain started when patient was sitting on edge of her bed and went to push herself up to standing position.  Pt reports "I felt a pop in my left shoulder".  Shoulder appears distorted.  Left radial pulse strong.

## 2016-08-10 NOTE — ED Notes (Signed)
Sling applied to left shoulder

## 2016-08-13 ENCOUNTER — Emergency Department
Admission: EM | Admit: 2016-08-13 | Discharge: 2016-08-13 | Disposition: A | Discharge status: Home / Self Care | Payer: Medicaid Other | Attending: Emergency Medicine | Admitting: Emergency Medicine

## 2016-08-13 DIAGNOSIS — Z7984 Long term (current) use of oral hypoglycemic drugs: Secondary | ICD-10-CM | POA: Diagnosis not present

## 2016-08-13 DIAGNOSIS — Z85828 Personal history of other malignant neoplasm of skin: Secondary | ICD-10-CM | POA: Diagnosis not present

## 2016-08-13 DIAGNOSIS — X58XXXD Exposure to other specified factors, subsequent encounter: Secondary | ICD-10-CM | POA: Insufficient documentation

## 2016-08-13 DIAGNOSIS — Z7982 Long term (current) use of aspirin: Secondary | ICD-10-CM | POA: Diagnosis not present

## 2016-08-13 DIAGNOSIS — E119 Type 2 diabetes mellitus without complications: Secondary | ICD-10-CM | POA: Insufficient documentation

## 2016-08-13 DIAGNOSIS — S46912D Strain of unspecified muscle, fascia and tendon at shoulder and upper arm level, left arm, subsequent encounter: Secondary | ICD-10-CM | POA: Diagnosis not present

## 2016-08-13 DIAGNOSIS — Z791 Long term (current) use of non-steroidal anti-inflammatories (NSAID): Secondary | ICD-10-CM | POA: Insufficient documentation

## 2016-08-13 DIAGNOSIS — Z794 Long term (current) use of insulin: Secondary | ICD-10-CM | POA: Diagnosis not present

## 2016-08-13 DIAGNOSIS — Z8522 Personal history of malignant neoplasm of nasal cavities, middle ear, and accessory sinuses: Secondary | ICD-10-CM | POA: Diagnosis not present

## 2016-08-13 DIAGNOSIS — Z79899 Other long term (current) drug therapy: Secondary | ICD-10-CM | POA: Insufficient documentation

## 2016-08-13 DIAGNOSIS — F17200 Nicotine dependence, unspecified, uncomplicated: Secondary | ICD-10-CM | POA: Diagnosis not present

## 2016-08-13 DIAGNOSIS — J45909 Unspecified asthma, uncomplicated: Secondary | ICD-10-CM | POA: Insufficient documentation

## 2016-08-13 DIAGNOSIS — I10 Essential (primary) hypertension: Secondary | ICD-10-CM | POA: Diagnosis not present

## 2016-08-13 DIAGNOSIS — S4992XD Unspecified injury of left shoulder and upper arm, subsequent encounter: Secondary | ICD-10-CM | POA: Diagnosis present

## 2016-08-13 MED ORDER — CYCLOBENZAPRINE HCL 5 MG PO TABS: 5.0000 mg | ORAL_TABLET | Freq: Three times a day (TID) | ORAL | 0 refills | Status: DC | PRN

## 2016-08-13 MED ORDER — NABUMETONE 750 MG PO TABS
750.0000 mg | ORAL_TABLET | Freq: Two times a day (BID) | ORAL | 0 refills | Status: DC
Start: 2016-08-13 — End: 2016-12-14

## 2016-08-13 NOTE — Discharge Instructions (Signed)
Wear the shoulder immobilizer only when out of bed. Do Not Sleep in it from this point forward. Come out of the immobilizer 2-3 times a day to perform the range of motion exercises I demonstrated. Apply ice for 20 minute intervals several times a day. Take the prescription anti-inflammatory pain medicine as directed and the muscle relaxant as needed. Follow-up with Dr. Roland Rack as scheduled. See your primary care provider for interim medical care.

## 2016-08-13 NOTE — ED Notes (Signed)
Patient states she came to ED Monday was told to make an apt with ortho. Patient states "Apt not available for two weeks" . Patient states " pain has gotten worse, radiating to neck and back with tingling and numbness in hand"

## 2016-08-13 NOTE — ED Triage Notes (Signed)
Pt c/o left shoulder pain that has not improved since seen here on Monday for the same, states she was dx with torn rotator cuff.. Pt has sling on arrival.

## 2016-08-13 NOTE — ED Provider Notes (Signed)
[facility] Emergency Department Provider Note ____________________________________________  Time seen: 1018  I have reviewed the triage vital signs and the nursing notes.  HISTORY  Chief Complaint  Shoulder Pain   HPI Brenda Hicks is a 50 y.o. female with a history of HTN, DM, and OSA who returns to the ED for re-evaluation of continued left shoulder pain. She describes being evaluated here 2-days prior for a suspected left shoulder dislocation as she pushed herself up in the bed. She was found to have a negative x-ray. She was placed in a shoulder sling and given oxycodone. She returns now noting distal paresthesias to the hands and muscle tension in the left neck. She reports she has only removed the sling to bathe. She has slept in the sling and has not applied ice or used the arm. She denies reinjury, but notes the narcotic pain medicine is not helpful.   Past Medical History:  Diagnosis Date  . Asthma   . Cancer of ear   . Diabetes mellitus without complication ([place])   . Hypertension     Patient Active Problem List   Diagnosis Date Noted  . Sebaceous cyst 06/01/2016  . Obstructive apnea 05/31/2016  . HLD (hyperlipidemia) 05/31/2016  . Acid reflux 05/31/2016  . Essential (primary) hypertension 05/31/2016  . Abnormal Pap smear of cervix 05/31/2016  . Arthritis of knee, degenerative 07/25/2015  . History of artificial joint 07/25/2015  . Gonalgia 02/17/2015  . Type 2 diabetes mellitus ([place]) 10/23/2014  . Otalgia of left ear 09/24/2014  . 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 Diabetic peripheral neuropathy associated with type 2 diabetes mellitus ([place]) 05/16/2014  . Endometrial polyp 11/01/2013  . Fibroids, intramural 10/09/2013  . Pain due to knee joint prosthesis ([place]) 10/05/2013  . Body mass index (BMI) of 50-59.9 in adult ([place]) 10/03/2013  . Abnormal  uterine bleeding 10/03/2013  . Adiposity 10/01/2013  . Excessive and frequent menstruation with irregular cycle 10/01/2013  . Adaptive colitis 10/01/2013  . History of migraine headaches 10/01/2013  . H/O malignant neoplasm of skin 10/01/2013  . Fatty liver disease, nonalcoholic 21/22/4825  . Diverticulitis 10/01/2013  . Current tobacco use 08/29/2013  . H/O total knee replacement 08/29/2013  . Cannot sleep 08/29/2013  . Dysmenorrhea 08/29/2013  . Airway hyperreactivity 08/29/2013  . Absolute anemia 08/29/2013  . Tobacco use 08/29/2013    Past Surgical History:  Procedure Laterality Date  . HERNIA REPAIR    . KNEE SURGERY Right     Prior to Admission medications   Medication Sig Start Date End Date Taking? Authorizing Provider  ACCU-CHEK SOFTCLIX LANCETS lancets  09/12/13   Historical Provider, MD  albuterol (PROAIR HFA) 108 (90 Base) MCG/ACT inhaler Inhale into the lungs. 01/14/16 01/13/17  Historical Provider, MD  aspirin EC 81 MG tablet Take by mouth. 01/14/16   Historical Provider, MD  atorvastatin (LIPITOR) 40 MG tablet Take 1 tablet (40 mg total) by mouth daily. 12/07/15 12/06/16  Johnn Hai, PA-C  Blood Glucose Monitoring Suppl (FIFTY50 GLUCOSE METER 2.0) w/Device KIT  06/24/15   Historical Provider, MD  Blood Glucose Monitoring Suppl (RA BLOOD GLUCOSE MONITOR) DEVI  06/24/15   Historical Provider, MD  cyclobenzaprine (FLEXERIL) 5 MG tablet Take 1 tablet (5 mg total) by mouth 3 (three) times daily as needed for muscle spasms. 08/13/16   Besan Ketchem V Bacon Tyshika Baldridge, PA-C  gabapentin (NEURONTIN) 300 MG capsule Take 300 mg by  mouth 4 (four) times daily.    Historical Provider, MD  glipiZIDE (GLUCOTROL XL) 2.5 MG 24 hr tablet  05/31/14   Historical Provider, MD  glucose blood test strip  05/16/14   Historical Provider, MD  HYDROcodone-acetaminophen (NORCO/VICODIN) 5-325 MG tablet Take 1 tablet by mouth every 4 (four) hours as needed. 08/10/16   Harvest Dark, MD  ibuprofen  (ADVIL,MOTRIN) 800 MG tablet Take 1 tablet (800 mg total) by mouth every 8 (eight) hours as needed. 07/26/16   Pierce Crane Beers, PA-C  insulin glargine (LANTUS) 100 UNIT/ML injection Inject 95 Units into the skin at bedtime.    Historical Provider, MD  insulin lispro (HUMALOG) 100 UNIT/ML injection Inject into the skin. 01/14/16 01/13/17  Historical Provider, MD  Insulin Syringe-Needle U-100 (INSULIN SYRINGE .5CC/30GX5/16") 30G X 5/16" 0.5 ML MISC  01/15/16   Historical Provider, MD  lisinopril (PRINIVIL,ZESTRIL) 40 MG tablet Take by mouth. 01/14/16 01/13/17  Historical Provider, MD  metFORMIN (GLUCOPHAGE) 1000 MG tablet Take 1,000 mg by mouth 2 (two) times daily with a meal.    Historical Provider, MD  montelukast (SINGULAIR) 10 MG tablet Take by mouth. 01/14/16 01/13/17  Historical Provider, MD  mupirocin ointment (BACTROBAN) 2 %  04/09/14   Historical Provider, MD  nabumetone (RELAFEN) 750 MG tablet Take 1 tablet (750 mg total) by mouth 2 (two) times daily. 08/13/16   Mahagony Grieb V Bacon Flavius Repsher, PA-C  omeprazole (PRILOSEC) 20 MG capsule Take by mouth. 01/14/16 01/13/17  Historical Provider, MD  oxyCODONE-acetaminophen (ROXICET) 5-325 MG tablet Take 1-2 tablets by mouth every 4 (four) hours as needed for severe pain. 07/26/16   Pierce Crane Beers, PA-C  sitaGLIPtin (JANUVIA) 25 MG tablet Take 25 mg by mouth daily.    Historical Provider, MD  sulfamethoxazole-trimethoprim (BACTRIM DS,SEPTRA DS) 800-160 MG tablet Take 1 tablet by mouth 2 (two) times daily. 07/26/16   Arlyss Repress, PA-C  traZODone (DESYREL) 100 MG tablet  06/22/14   Historical Provider, MD    Allergies Review of patient's allergies indicates no known allergies.  No family history on file.  Social History Social History  Substance Use Topics  . Smoking status: Current Every Day Smoker  . Smokeless tobacco: Never Used  . Alcohol use No    Review of Systems  Constitutional: Negative for fever. Cardiovascular: Negative for chest  pain. Respiratory: Negative for shortness of breath. Musculoskeletal: Negative for back pain. Left shoulder pain as above. Skin: Negative for rash. Neurological: Negative for headaches, focal weakness or numbness. ____________________________________________  PHYSICAL EXAM:  VITAL SIGNS: ED Triage Vitals  Enc Vitals Group     BP 08/13/16 0949 (!) 140/96     Pulse Rate 08/13/16 0949 (!) 102     Resp 08/13/16 0949 18     Temp 08/13/16 0949 98.1 F (36.7 C)     Temp Source 08/13/16 0949 Oral     SpO2 08/13/16 0949 96 %     Weight 08/13/16 0949 (!) 324 lb (147 kg)     Height 08/13/16 0949 _0  (1.651 m)     Head Circumference --      Peak Flow --      Pain Score 08/13/16 1010 10     Pain Loc --      Pain Edu? --      Excl. in [place]? --     Constitutional: Alert and oriented. Well appearing and in no distress. Head: Normocephalic and atraumatic. Neck: Supple. No thyromegaly.  Cardiovascular: Normal Distal pulses and capillary  refill.  Respiratory: Normal respiratory effort.  Musculoskeletal: Left shoulder without obvious deformity, dislocation, or sulcus sign. Patient without significant is to palpation over the left clavicle or before meals joint. She seems to localize more discomfort to the posterior scapulothoracic region and the upper traps on the left. He is able to demonstrate normal active range of motion with extension and abduction to about 90. She is also noted to have intact infraspinatus and supraspinatus with isolated testing. Normal composite fist bilaterally. Nontender with normal range of motion in all other extremities.  Neurologic:  Cranial nerves II through XII grossly intact. Normal UE DTRs bilaterally. No gross focal neurologic deficits are appreciated. Skin:  Skin is warm, dry and intact. No rash noted. ____________________________________________  INITIAL IMPRESSION / ASSESSMENT AND PLAN / ED COURSE  Patient with continued left shoulder strain following a  suspected subluxation. She appears to have some rotator cuff tendinitis on examination. No focal deficits or signs of dislocation. Patient is encouraged to wean herself out of the arm sling during the day to perform range of motion exercises which were demonstrated. She is also advised that she does not have to sleep in a sling at night. She is advised assumption is to apply ice to the shoulder for inflammation and she will be discharged with a prescription for Relafen and Flexeril to dose as directed. She will follow-up with Dr. Roland Rack in 2 weeks as scheduled or sooner if available.  Clinical Course   ____________________________________________  FINAL CLINICAL IMPRESSION(S) / ED DIAGNOSES  Final diagnoses:  Left shoulder strain, subsequent encounter     Melvenia Needles, PA-C 08/13/16 Alcorn Yao, MD 08/13/16 1529

## 2016-08-13 NOTE — ED Notes (Signed)
Patient's (L) Shoulder tender to palpation.

## 2016-10-06 ENCOUNTER — Other Ambulatory Visit: Payer: Self-pay | Admitting: Student

## 2016-10-06 DIAGNOSIS — M25512 Pain in left shoulder: Secondary | ICD-10-CM

## 2016-10-13 ENCOUNTER — Emergency Department: Payer: Medicaid Other

## 2016-10-13 ENCOUNTER — Encounter: Payer: Self-pay | Admitting: Emergency Medicine

## 2016-10-13 ENCOUNTER — Emergency Department
Admission: EM | Admit: 2016-10-13 | Discharge: 2016-10-13 | Disposition: A | Discharge status: Home / Self Care | Payer: Medicaid Other | Attending: Emergency Medicine | Admitting: Emergency Medicine

## 2016-10-13 DIAGNOSIS — Z794 Long term (current) use of insulin: Secondary | ICD-10-CM | POA: Insufficient documentation

## 2016-10-13 DIAGNOSIS — Z79899 Other long term (current) drug therapy: Secondary | ICD-10-CM | POA: Diagnosis not present

## 2016-10-13 DIAGNOSIS — Z8522 Personal history of malignant neoplasm of nasal cavities, middle ear, and accessory sinuses: Secondary | ICD-10-CM | POA: Insufficient documentation

## 2016-10-13 DIAGNOSIS — R220 Localized swelling, mass and lump, head: Secondary | ICD-10-CM

## 2016-10-13 DIAGNOSIS — I10 Essential (primary) hypertension: Secondary | ICD-10-CM | POA: Diagnosis not present

## 2016-10-13 DIAGNOSIS — Z7982 Long term (current) use of aspirin: Secondary | ICD-10-CM | POA: Insufficient documentation

## 2016-10-13 DIAGNOSIS — Z791 Long term (current) use of non-steroidal anti-inflammatories (NSAID): Secondary | ICD-10-CM | POA: Insufficient documentation

## 2016-10-13 DIAGNOSIS — E119 Type 2 diabetes mellitus without complications: Secondary | ICD-10-CM | POA: Insufficient documentation

## 2016-10-13 DIAGNOSIS — K047 Periapical abscess without sinus: Secondary | ICD-10-CM | POA: Diagnosis not present

## 2016-10-13 DIAGNOSIS — F17200 Nicotine dependence, unspecified, uncomplicated: Secondary | ICD-10-CM | POA: Insufficient documentation

## 2016-10-13 DIAGNOSIS — J45909 Unspecified asthma, uncomplicated: Secondary | ICD-10-CM | POA: Insufficient documentation

## 2016-10-13 DIAGNOSIS — Z1239 Encounter for other screening for malignant neoplasm of breast: Secondary | ICD-10-CM

## 2016-10-13 LAB — CBC WITH DIFFERENTIAL/PLATELET
BASOS ABS: 0 10*3/uL (ref 0–0.1)
BASOS PCT: 1 %
EOS ABS: 0.1 10*3/uL (ref 0–0.7)
Eosinophils Relative: 2 %
HCT: 43 % (ref 35.0–47.0)
HEMOGLOBIN: 14.6 g/dL (ref 12.0–16.0)
Lymphocytes Relative: 38 %
Lymphs Abs: 3.1 10*3/uL (ref 1.0–3.6)
MCH: 31 pg (ref 26.0–34.0)
MCHC: 33.9 g/dL (ref 32.0–36.0)
MCV: 91.6 fL (ref 80.0–100.0)
Monocytes Absolute: 0.6 10*3/uL (ref 0.2–0.9)
Monocytes Relative: 8 %
NEUTROS ABS: 4.3 10*3/uL (ref 1.4–6.5)
NEUTROS PCT: 51 %
Platelets: 256 10*3/uL (ref 150–440)
RBC: 4.69 MIL/uL (ref 3.80–5.20)
RDW: 14.8 % — ABNORMAL HIGH (ref 11.5–14.5)
WBC: 8.1 10*3/uL (ref 3.6–11.0)

## 2016-10-13 LAB — BASIC METABOLIC PANEL
ANION GAP: 7 (ref 5–15)
BUN: 8 mg/dL (ref 6–20)
CHLORIDE: 102 mmol/L (ref 101–111)
CO2: 28 mmol/L (ref 22–32)
CREATININE: 0.91 mg/dL (ref 0.44–1.00)
Calcium: 8.8 mg/dL — ABNORMAL LOW (ref 8.9–10.3)
GFR calc non Af Amer: >60 mL/min (ref >60)
Glucose, Bld: 201 mg/dL — ABNORMAL HIGH (ref 65–99)
POTASSIUM: 4 mmol/L (ref 3.5–5.1)
SODIUM: 137 mmol/L (ref 135–145)

## 2016-10-13 MED ORDER — CLINDAMYCIN HCL 300 MG PO CAPS: 300.0000 mg | ORAL_CAPSULE | Freq: Three times a day (TID) | ORAL | 0 refills | Status: AC

## 2016-10-13 MED ORDER — ONDANSETRON HCL 4 MG/2ML IJ SOLN
4.0000 mg | Freq: Once | INTRAMUSCULAR | Status: AC
  Administered 2016-10-13: 4 mg via INTRAVENOUS
  Filled 2016-10-13: qty 2

## 2016-10-13 MED ORDER — HYDROMORPHONE HCL 1 MG/ML IJ SOLN
1.0000 mg | Freq: Once | INTRAMUSCULAR | Status: AC
  Administered 2016-10-13: 1 mg via INTRAVENOUS
  Filled 2016-10-13: qty 1

## 2016-10-13 MED ORDER — CLINDAMYCIN PHOSPHATE 900 MG/50ML IV SOLN
900.0000 mg | Freq: Once | INTRAVENOUS | Status: AC
  Administered 2016-10-13: 900 mg via INTRAVENOUS
  Filled 2016-10-13: qty 50

## 2016-10-13 MED ORDER — TRAMADOL HCL 50 MG PO TABS: 50.0000 mg | ORAL_TABLET | Freq: Four times a day (QID) | ORAL | 0 refills | Status: DC | PRN

## 2016-10-13 NOTE — ED Provider Notes (Signed)
Time Seen: Approximately *0832  I have reviewed the triage notes  Chief Complaint: Facial Swelling   History of Present Illness: Brenda Hicks is a 50 y.o. female *who presents with acute onset of some right-sided facial swelling. She is not aware of any fever at home. She denies any tongue swelling or difficulty with speech or swallowing. She has a right-sided headache that started roughly at the same time as her facial swelling. She describes some blurred vision in her right eye but no visual field deficits and no photophobia or midline neck discomfort. She did not take any pain medication prior to arrival.   Past Medical History:  Diagnosis Date  . Asthma   . Cancer of ear   . Diabetes mellitus without complication ([place])   . Hypertension     Patient Active Problem List   Diagnosis Date Noted  . Sebaceous cyst 06/01/2016  . Obstructive apnea 05/31/2016  . HLD (hyperlipidemia) 05/31/2016  . Acid reflux 05/31/2016  . Essential (primary) hypertension 05/31/2016  . Abnormal Pap smear of cervix 05/31/2016  . Arthritis of knee, degenerative 07/25/2015  . History of artificial joint 07/25/2015  . Gonalgia 02/17/2015  . Type 2 diabetes mellitus ([place]) 10/23/2014  . Otalgia of left ear 09/24/2014  . 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 Diabetic peripheral neuropathy associated with type 2 diabetes mellitus ([place]) 05/16/2014  . Endometrial polyp 11/01/2013  . Fibroids, intramural 10/09/2013  . Pain due to knee joint prosthesis ([place]) 10/05/2013  . Body mass index (BMI) of 50-59.9 in adult ([place]) 10/03/2013  . Abnormal uterine bleeding 10/03/2013  . Adiposity 10/01/2013  . Excessive and frequent menstruation with irregular cycle 10/01/2013  . Adaptive colitis 10/01/2013  . History of migraine headaches 10/01/2013  . H/O malignant neoplasm of skin 10/01/2013  . Fatty liver disease, nonalcoholic  0000000  . Diverticulitis 10/01/2013  . Current tobacco use 08/29/2013  . H/O total knee replacement 08/29/2013  . Cannot sleep 08/29/2013  . Dysmenorrhea 08/29/2013  . Airway hyperreactivity 08/29/2013  . Absolute anemia 08/29/2013  . Tobacco use 08/29/2013    Past Surgical History:  Procedure Laterality Date  . HERNIA REPAIR    . KNEE SURGERY Right     Past Surgical History:  Procedure Laterality Date  . HERNIA REPAIR    . KNEE SURGERY Right     Current Outpatient Rx  . Order #: [id number] Class: Historical Med  . Order #: [id number] Class: Historical Med  . Order #: [id number] Class: Historical Med  . Order #: [id number] Class: Print  . Order #: [id number] Class: Historical Med  . Order #: [id number] Class: Historical Med  . Order #: [id number] Class: Print  . Order #: [id number] Class: Historical Med  . Order #: [id number] Class: Historical Med  . Order #: [id number] Class: Historical Med  . Order #: [id number] Class: Print  . Order #: [id number] Class: Print  . Order #: [id number] Class: Historical Med  . Order #: [id number] Class: Historical Med  . Order #: [id number] Class: Historical Med  . Order #: [id number] Class: Historical Med  . Order #: [id number] Class: Historical Med  . Order #: [id number] Class: Historical Med  . Order #: [id number] Class: Historical Med  . Order #: [id number] Class: Print  . Order #: [id number] Class: Historical Med  . Order #: [id number] Class: Print  . Order #: [id number] Class: Historical Med  . Order #: [id number] Class: Print  . Order #: [id number] Class: Historical Med    Allergies:  Patient has no known allergies.  Family History: History  reviewed. No pertinent family history.  Social History: Social History  Substance Use Topics  . Smoking status: Current Every Day Smoker  . Smokeless tobacco: Never Used  . Alcohol use No     Review of Systems:   10 point review of systems was performed and was otherwise negative:  Constitutional: No fever Eyes:No  left-sided facial swelling or visual disturbances ENT: No sore throat, ear pain Cardiac: No chest pain Respiratory: No shortness of breath, wheezing, or stridor Abdomen: No abdominal pain, no vomiting, No diarrhea Endocrine: No weight loss, No night sweats Extremities: No peripheral edema, cyanosis Skin: No rashes, easy bruising Neurologic: No focal weakness, trouble with speech or swollowing Urologic: No dysuria, Hematuria, or urinary frequency Patient states he's had tubal ligation denies any risk of pregnancy.  Physical Exam:  ED Triage Vitals  Enc Vitals Group     BP 10/13/16 0809 (!) 184/119     Pulse Rate 10/13/16 0807 96     Resp 10/13/16 0807 (!) 22     Temp 10/13/16 0807 99 F (37.2 C)     Temp Source 10/13/16 0807 Oral     SpO2 10/13/16 0807 96 %     Weight 10/13/16 0807 (!) 318 lb (144.2 kg)     Height 10/13/16 0807 5\' 5"  (1.651 m)     Head Circumference --      Peak Flow --      Pain Score 10/13/16 0807 10     Pain Loc --      Pain Edu? --      Excl. in [place]? --     General: Awake , Alert , and Oriented times 3; GCS 15 Head: Right-sided facial swelling over the maxillary region. Tender and warm to the touch Eyes: Pupils equal , round, reactive to light. No injection of the conjunctiva normal-appearing iris and anterior chamber of the right eye. Nose/Throat: No nasal drainage, patent upper airway without erythema or exudate.  Patient has very tender dentition at the roof of her mouth with some erosion Neck: Supple, Full range of motion, No anterior adenopathy or palpable thyroid masses Lungs: Clear to ascultation without wheezes , rhonchi, or rales Heart: Regular rate, regular rhythm without murmurs , gallops , or rubs Abdomen: Soft, non tender without rebound, guarding , or rigidity; bowel sounds positive and symmetric in all 4 quadrants. No organomegaly .        Extremities: 2 plus symmetric pulses. No edema, clubbing or cyanosis Neurologic: normal ambulation,  Motor symmetric without deficits, sensory intact Skin: warm, dry, no rashes   Labs:   All laboratory work was reviewed including any pertinent negatives or positives listed below:  Labs Reviewed  CBC WITH DIFFERENTIAL/PLATELET - Abnormal; Notable for the following:       Result Value   RDW 14.8 (*)    All other components within normal limits  BASIC METABOLIC PANEL - Abnormal; Notable for the following:    Glucose, Bld 201 (*)    Calcium 8.8 (*)    All other components within normal limits    Radiology:  "Ct Head Wo Contrast  Result Date: 10/13/2016 CLINICAL DATA:  Woke up with right facial pain and swollen. Right-sided headache. EXAM: CT HEAD WITHOUT CONTRAST CT MAXILLOFACIAL WITHOUT CONTRAST TECHNIQUE: Multidetector CT imaging of the head and maxillofacial structures were performed using the standard protocol without intravenous contrast. Multiplanar CT image reconstructions of the maxillofacial structures were also generated. COMPARISON:  None. FINDINGS: CT HEAD FINDINGS Brain: No evidence  for acute hemorrhage, mass lesion, midline shift, hydrocephalus or large infarct. Vascular: No hyperdense vessel or unexpected calcification. Skull: Normal. Negative for fracture or focal lesion. Other: None. CT MAXILLOFACIAL FINDINGS Osseous: No fracture. Mandibular condyles are located. No acute abnormality in the upper cervical spine. Patient is missing multiple teeth and there is evidence for multiple caries. In particular, there is a remnant of the right lower second molar with periapical lucency and a large caries. This is also a periapical lucency along the posterior right maxilla related to a residual tooth fragment in this area. Large caries associated with the right upper second bicuspid. Orbits: Negative. No traumatic or inflammatory finding. Sinuses: Sinuses are clear. Soft tissues: Soft tissue swelling in the right cheek and right mandible region. Soft tissue swelling in this most prominent  just lateral to the right maxilla. Normal appearance of the globes. Small nodular structures in both parotid glands probably represent parotid lymph nodes. Submandibular glands are unremarkable. IMPRESSION: Soft tissue swelling along the right side of the face with underlying dental disease, particularly on the right side. Soft swelling is suggestive for an inflammatory process which could be odontogenic in etiology. No clear evidence for a soft tissue abscess but limited evaluation without intravenous contrast. No significant paranasal sinus disease. No acute intracranial abnormality. Electronically Signed   By: Markus Daft M.D.   On: 10/13/2016 09:41   Ct Maxillofacial Wo Contrast  Result Date: 10/13/2016 CLINICAL DATA:  Woke up with right facial pain and swollen. Right-sided headache. EXAM: CT HEAD WITHOUT CONTRAST CT MAXILLOFACIAL WITHOUT CONTRAST TECHNIQUE: Multidetector CT imaging of the head and maxillofacial structures were performed using the standard protocol without intravenous contrast. Multiplanar CT image reconstructions of the maxillofacial structures were also generated. COMPARISON:  None. FINDINGS: CT HEAD FINDINGS Brain: No evidence for acute hemorrhage, mass lesion, midline shift, hydrocephalus or large infarct. Vascular: No hyperdense vessel or unexpected calcification. Skull: Normal. Negative for fracture or focal lesion. Other: None. CT MAXILLOFACIAL FINDINGS Osseous: No fracture. Mandibular condyles are located. No acute abnormality in the upper cervical spine. Patient is missing multiple teeth and there is evidence for multiple caries. In particular, there is a remnant of the right lower second molar with periapical lucency and a large caries. This is also a periapical lucency along the posterior right maxilla related to a residual tooth fragment in this area. Large caries associated with the right upper second bicuspid. Orbits: Negative. No traumatic or inflammatory finding. Sinuses:  Sinuses are clear. Soft tissues: Soft tissue swelling in the right cheek and right mandible region. Soft tissue swelling in this most prominent just lateral to the right maxilla. Normal appearance of the globes. Small nodular structures in both parotid glands probably represent parotid lymph nodes. Submandibular glands are unremarkable. IMPRESSION: Soft tissue swelling along the right side of the face with underlying dental disease, particularly on the right side. Soft swelling is suggestive for an inflammatory process which could be odontogenic in etiology. No clear evidence for a soft tissue abscess but limited evaluation without intravenous contrast. No significant paranasal sinus disease. No acute intracranial abnormality. Electronically Signed   By: Markus Daft M.D.   On: 10/13/2016 09:41  "  I personally reviewed the radiologic studies    ED Course:  Patient states her headache, blurred vision, etc. feels symptomatically improved after she was given some IV pain medication along with starting out both clindamycin. It appears her facial swelling is exclusively from a dental abscess. The patient will receive clindamycin on  an outpatient basis and referral to dental clinics in the area.   Clinical Course      Assessment:  Right-sided facial swelling secondary to dental abscess     Plan: * Outpatient " New Prescriptions   CLINDAMYCIN (CLEOCIN) 300 MG CAPSULE    Take 1 capsule (300 mg total) by mouth 3 (three) times daily.   TRAMADOL (ULTRAM) 50 MG TABLET    Take 1 tablet (50 mg total) by mouth every 6 (six) hours as needed.  " Patient was advised to return immediately if condition worsens. Patient was advised to follow up with their primary care physician or other specialized physicians involved in their outpatient care. The patient and/or family member/power of attorney had laboratory results reviewed at the bedside. All questions and concerns were addressed and appropriate discharge  instructions were distributed by the nursing staff.             Daymon Larsen, MD 10/13/16 [id number]

## 2016-10-13 NOTE — Discharge Instructions (Signed)
Please return immediately if condition worsens. Please contact her primary physician or the physician you were given for referral. If you have any specialist physicians involved in her treatment and plan please also contact them. Thank you for using [facility].  Please drink plenty of fluids and take 2-3 over-the-counter ibuprofen every 6 hours for pain. You can also take a 2 extra strength Tylenol every 6 hours.

## 2016-10-13 NOTE — ED Triage Notes (Signed)
Pt c/o right sided facial swelling when woke up. Poor dentition but denies tooth pain. Denies any trouble breathing or swallowing.  Pt is diabetic. C/o blurry vision in right eye.

## 2016-10-27 ENCOUNTER — Ambulatory Visit: Admission: RE | Admit: 2016-10-27 | Payer: Medicaid Other | Source: Ambulatory Visit

## 2016-11-11 ENCOUNTER — Ambulatory Visit
Admission: RE | Admit: 2016-11-11 | Discharge: 2016-11-11 | Disposition: A | Discharge status: Home / Self Care | Payer: Medicaid Other | Source: Ambulatory Visit | Attending: Student | Admitting: Student

## 2016-11-11 DIAGNOSIS — M7582 Other shoulder lesions, left shoulder: Secondary | ICD-10-CM | POA: Insufficient documentation

## 2016-11-11 DIAGNOSIS — M12812 Other specific arthropathies, not elsewhere classified, left shoulder: Secondary | ICD-10-CM | POA: Diagnosis not present

## 2016-11-11 DIAGNOSIS — M25512 Pain in left shoulder: Secondary | ICD-10-CM | POA: Diagnosis present

## 2016-11-11 DIAGNOSIS — M75102 Unspecified rotator cuff tear or rupture of left shoulder, not specified as traumatic: Secondary | ICD-10-CM | POA: Diagnosis not present

## 2016-11-14 IMAGING — CR DG KNEE COMPLETE 4+V*R*
1 series · 4 of 4 positions shown · non-contrast
Comparison: 06/16/2015

CLINICAL DATA: Fall with pain around the knee.  Initial encounter.

EXAM:
RIGHT KNEE - COMPLETE 4+ VIEW

[Series 1: dg knee complete 4 views right · 0.14mm/px · 4 of 4 slices shown]
[im 1/4]
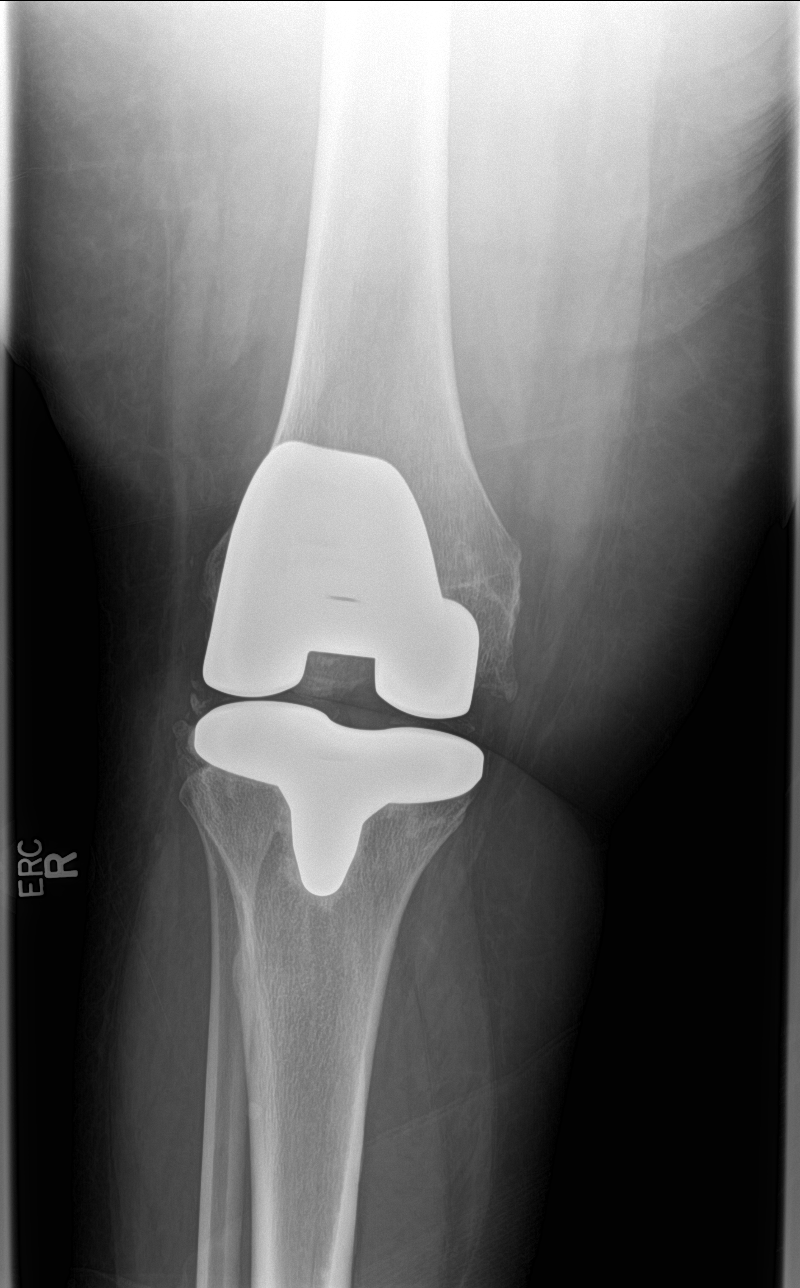
[im 2/4]
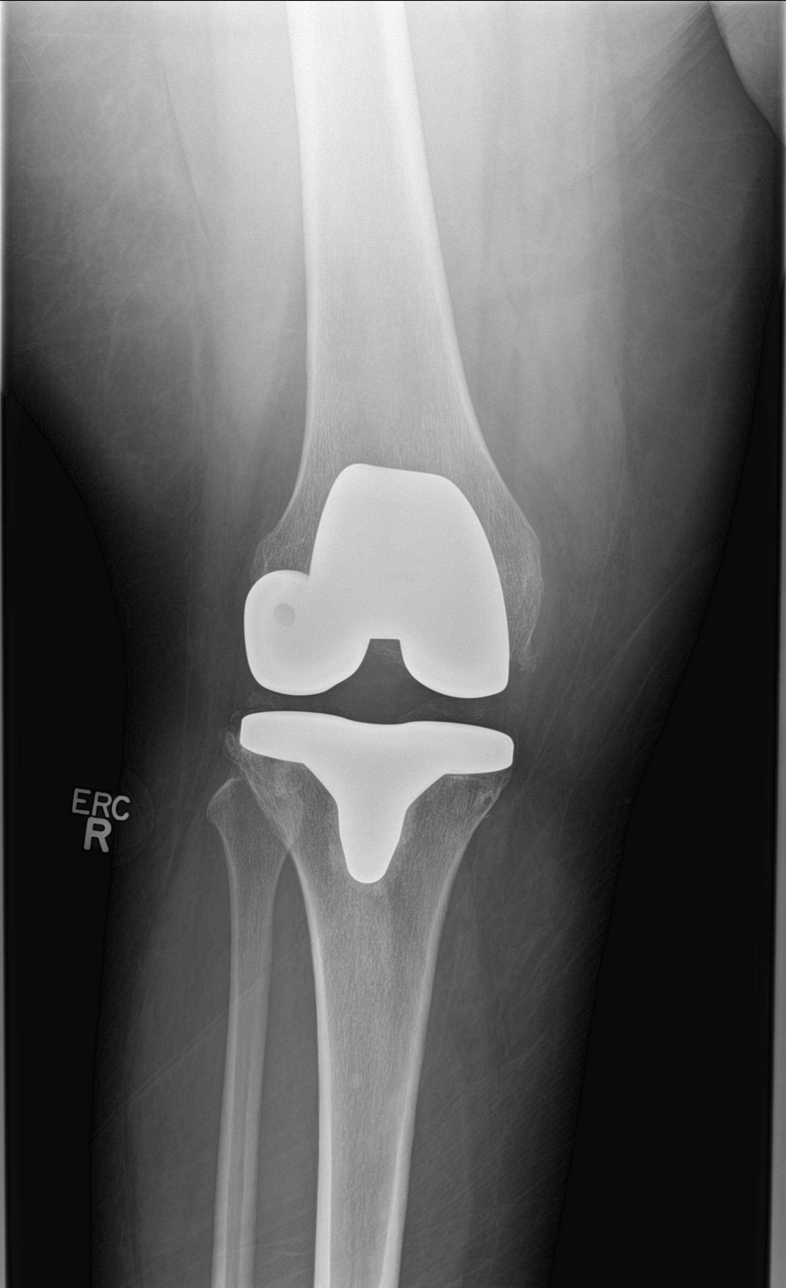
[im 3/4]
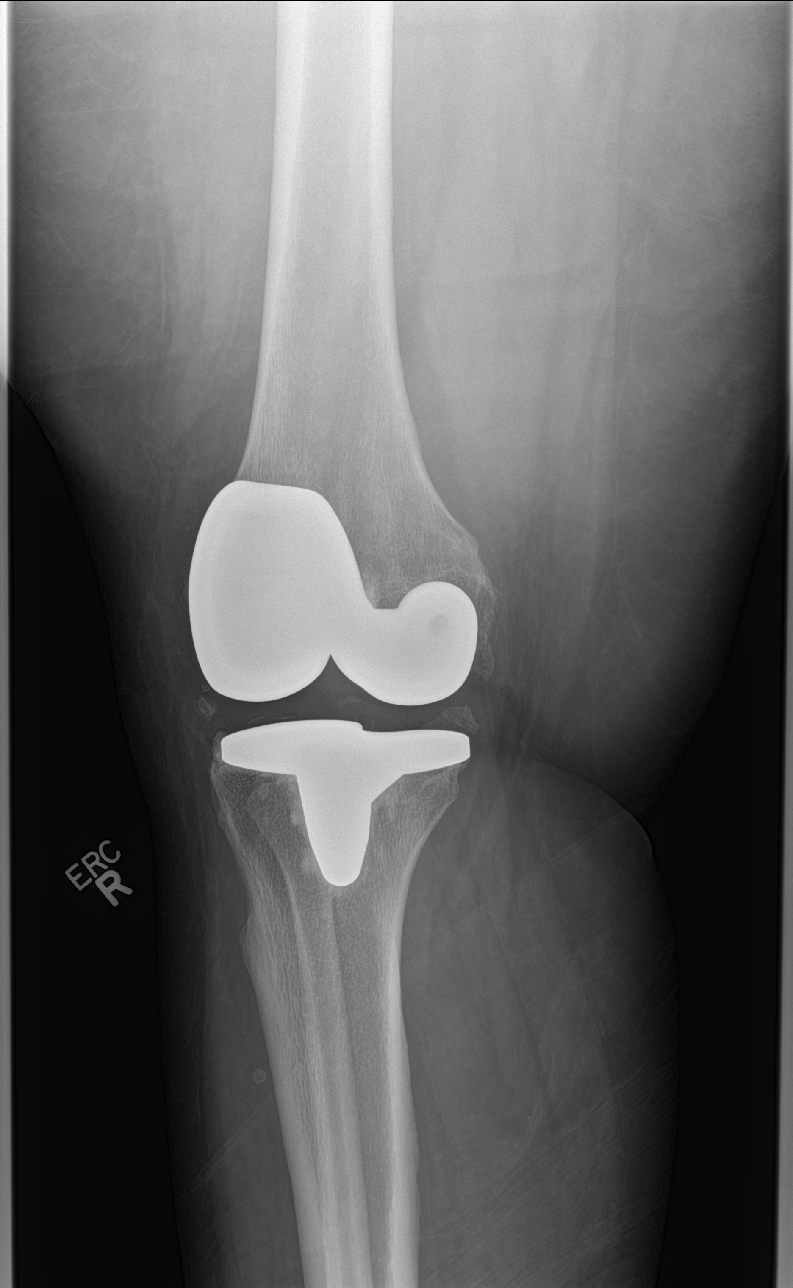
[im 4/4]
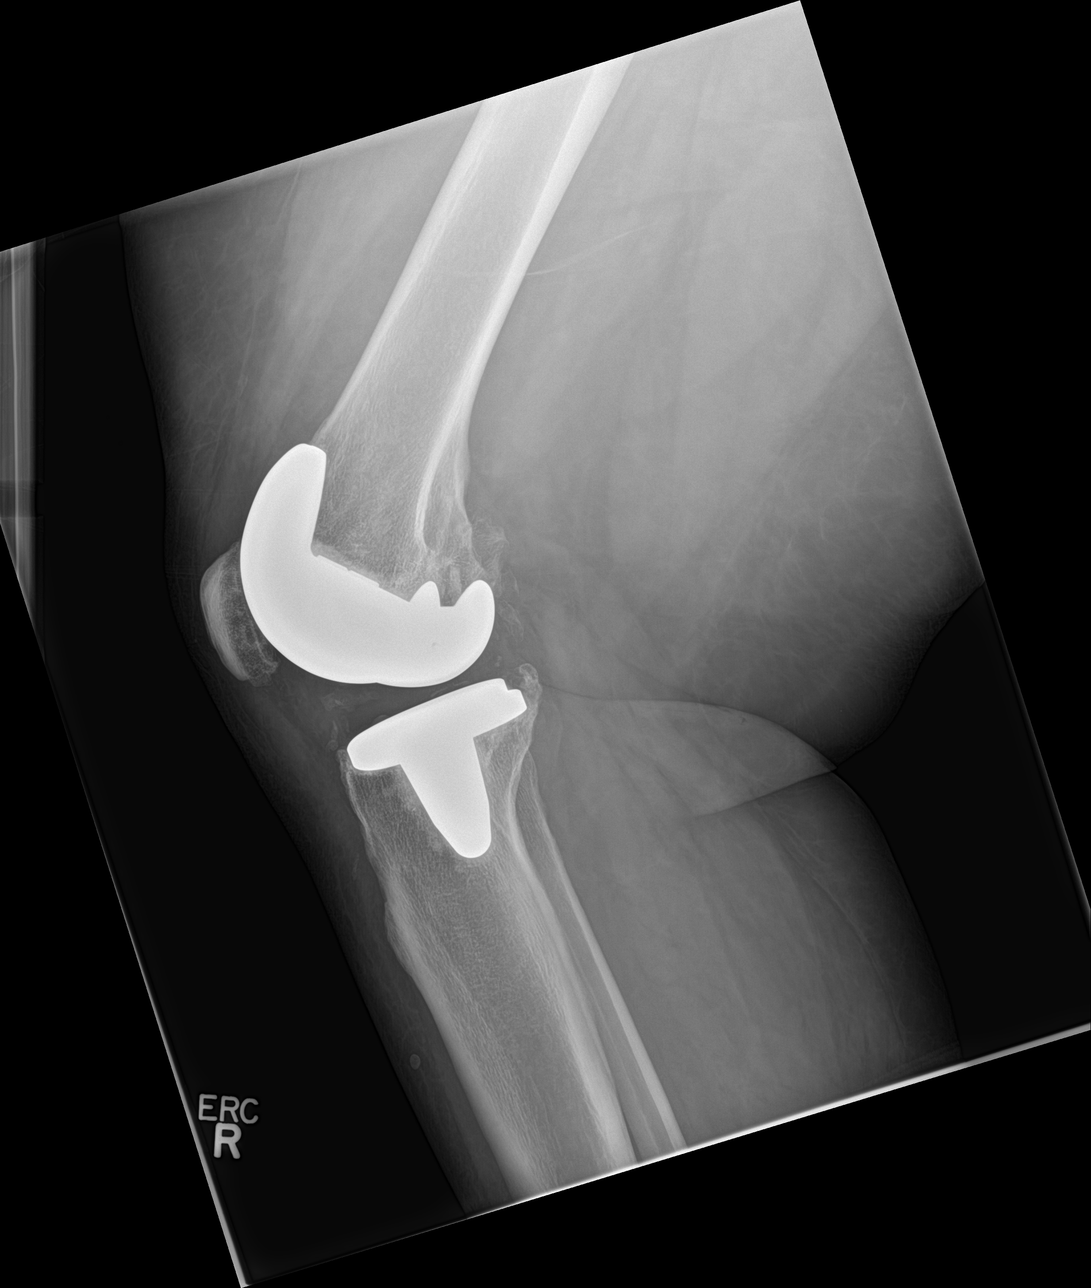

[4 of 4 positions shown; findings below may reference images not displayed]

FINDINGS: Total knee arthroplasty is overall well seated. Lucency along the
medial arthroplasty is stable. No fracture or dislocation. No joint
effusion.
IMPRESSION: No acute finding.  Stable appearance of total knee arthroplasty.

## 2016-12-07 ENCOUNTER — Encounter
Admission: RE | Admit: 2016-12-07 | Discharge: 2016-12-07 | Disposition: A | Discharge status: Home / Self Care | Payer: Medicaid Other | Source: Ambulatory Visit | Attending: Surgery | Admitting: Surgery

## 2016-12-07 HISTORY — DX: Unspecified osteoarthritis, unspecified site: M19.90

## 2016-12-07 HISTORY — DX: Sleep apnea, unspecified: G47.30

## 2016-12-07 NOTE — Patient Instructions (Addendum)
  Your procedure is scheduled on: 12-14-16 (TUESDAY) Report to Same Day Surgery 2nd floor medical mall [facility] Entrance-take elevator on left to 2nd floor.  Check in with surgery information desk.) To find out your arrival time please call [phone] between 1PM - 3PM on 12-13-16 [facility])  Remember: Instructions that are not followed completely may result in serious medical risk, up to and including death, or upon the discretion of your surgeon and anesthesiologist your surgery may need to be rescheduled.    _x___ 1. Do not eat food or drink liquids after midnight. No gum chewing or hard candies.     __x__ 2. No Alcohol for 24 hours before or after surgery.   __x__3. No Smoking for 24 prior to surgery.   ____  4. Bring all medications with you on the day of surgery if instructed.    __x__ 5. Notify your doctor if there is any change in your medical condition     (cold, fever, infections).     Do not wear jewelry, make-up, hairpins, clips or nail polish.  Do not wear lotions, powders, or perfumes. You may wear deodorant.  Do not shave 48 hours prior to surgery. Men may shave face and neck.  Do not bring valuables to the hospital.    [facility] is not responsible for any belongings or valuables.               Contacts, dentures or bridgework may not be worn into surgery.  Leave your suitcase in the car. After surgery it may be brought to your room.  For patients admitted to the hospital, discharge time is determined by your treatment team.   Patients discharged the day of surgery will not be allowed to drive home.  You will need someone to drive you home and stay with you the night of your procedure.    Please read over the following fact sheets that you were given:   [facility] Preparing for Surgery and or MRSA Information   ____ Take these medicines the morning of surgery with A SIP OF WATER:    1. NONE  2.  3.  4.  5.  6.  ____Fleets enema or Magnesium Citrate as  directed.   _x___ Use CHG Soap or sage wipes as directed on instruction sheet   _X___ Use inhalers on the day of surgery and bring to hospital day of surgery-USE ALBUTEROL INHALER AT [place]  _X___ Stop metformin 2 days prior to surgery-LAST DOSE ON Saturday, 12-11-16    _X___ Take 1/2 of usual insulin dose the night before surgery and none on the morning of surgery-TAKE 50 UNITS OF INSULIN ON Monday NIGHT AND NO  INSULIN THE MORNING OF SURGERY  ____ Stop Aspirin, Coumadin, Pllavix ,Eliquis, Effient, or Pradaxa  x__ Stop Anti-inflammatories such as Advil, Aleve, Ibuprofen, Motrin, Naproxen,          Naprosyn, Goodies powders or aspirin products NOW-Ok to take Tylenol.   ____ Stop supplements until after surgery.    _X___ Bring C-Pap to the hospital.

## 2016-12-08 ENCOUNTER — Encounter
Admission: RE | Admit: 2016-12-08 | Discharge: 2016-12-08 | Disposition: A | Discharge status: Home / Self Care | Payer: Medicaid Other | Source: Ambulatory Visit | Attending: Surgery | Admitting: Surgery

## 2016-12-08 DIAGNOSIS — I10 Essential (primary) hypertension: Secondary | ICD-10-CM | POA: Diagnosis present

## 2016-12-08 LAB — BASIC METABOLIC PANEL
ANION GAP: 7 (ref 5–15)
BUN: 13 mg/dL (ref 6–20)
CALCIUM: 9.1 mg/dL (ref 8.9–10.3)
CO2: 28 mmol/L (ref 22–32)
Chloride: 104 mmol/L (ref 101–111)
Creatinine, Ser: 0.78 mg/dL (ref 0.44–1.00)
GFR calc Af Amer: >60 mL/min (ref >60)
GLUCOSE: 225 mg/dL — AB (ref 65–99)
Potassium: 4.2 mmol/L (ref 3.5–5.1)
SODIUM: 139 mmol/L (ref 135–145)

## 2016-12-10 NOTE — Pre-Procedure Instructions (Signed)
[facility] not opening until 10 am. I called @ 10:05 and the office was still closed and not taking calls. I will try again later.  I faxed all the clearance paperwork over to PCP office with fax confirmation received.  I also tried to call Tiffany @ [facility] office and the line is constantly busy.  Faxed Dr [facility] office all of the clearance information with fax confirmation received

## 2016-12-10 NOTE — Pre-Procedure Instructions (Signed)
I have been on hold with [facility] for the past 10 minutes trying to get thru to their office and I am unsure if they are even open due to the snow.  I will continue to hold until I get someone-Dr Poggis office has been closed and will reopen today at 10 am-once they get in the office I will alert them to the pt needing clearance

## 2016-12-10 NOTE — Pre-Procedure Instructions (Signed)
Called Dr Marcello Moores regarding abnormal EKG-  I read off what [facility]USE had interpreted EKG as and asked him if he could look at it in [facility] and after I read interpretation, Dr Marcello Moores wanted Medical Clearance

## 2016-12-10 NOTE — Pre-Procedure Instructions (Signed)
Finally got thru to [address] were closed on Wed and Thur. But did reopen today.  I spoke with nurse at [facility] and she gave me an appt with Dr Pauline Good today @ 4 pm for Mrs. Vallier. I called pt and let her know that she is needing medical clearance and that she has an appt today at 4pm-Pt verbalized she would be at the appointment-Called and spoke with Myriam Jacobson at [facility] office and notified her that pt is going today @ 4 pm for clearance

## 2016-12-13 MED ORDER — DEXTROSE 5 % IV SOLN
3.0000 g | Freq: Once | INTRAVENOUS | Status: AC
  Administered 2016-12-14: 3 g via INTRAVENOUS
  Filled 2016-12-13: qty 3000

## 2016-12-13 NOTE — Pre-Procedure Instructions (Signed)
Received Medical Clearance from Dr Pauline Good at [facility] Risk-Clearance on chart and in [place]

## 2016-12-14 ENCOUNTER — Encounter
Admission: RE | Disposition: A | Discharge status: Home / Self Care | Payer: Self-pay | Source: Ambulatory Visit | Attending: Surgery

## 2016-12-14 ENCOUNTER — Ambulatory Visit
Admission: RE | Admit: 2016-12-14 | Discharge: 2016-12-14 | Disposition: A | Discharge status: Home / Self Care | Payer: Medicaid Other | Source: Ambulatory Visit | Attending: Surgery | Admitting: Surgery

## 2016-12-14 ENCOUNTER — Encounter: Payer: Self-pay | Admitting: *Deleted

## 2016-12-14 ENCOUNTER — Ambulatory Visit: Payer: Medicaid Other | Admitting: Anesthesiology

## 2016-12-14 DIAGNOSIS — I10 Essential (primary) hypertension: Secondary | ICD-10-CM | POA: Diagnosis not present

## 2016-12-14 DIAGNOSIS — M7542 Impingement syndrome of left shoulder: Secondary | ICD-10-CM | POA: Diagnosis not present

## 2016-12-14 DIAGNOSIS — E119 Type 2 diabetes mellitus without complications: Secondary | ICD-10-CM | POA: Insufficient documentation

## 2016-12-14 DIAGNOSIS — Z7982 Long term (current) use of aspirin: Secondary | ICD-10-CM | POA: Diagnosis not present

## 2016-12-14 DIAGNOSIS — J45909 Unspecified asthma, uncomplicated: Secondary | ICD-10-CM | POA: Diagnosis not present

## 2016-12-14 DIAGNOSIS — S46012A Strain of muscle(s) and tendon(s) of the rotator cuff of left shoulder, initial encounter: Secondary | ICD-10-CM | POA: Insufficient documentation

## 2016-12-14 DIAGNOSIS — X500XXA Overexertion from strenuous movement or load, initial encounter: Secondary | ICD-10-CM | POA: Insufficient documentation

## 2016-12-14 DIAGNOSIS — G4730 Sleep apnea, unspecified: Secondary | ICD-10-CM | POA: Diagnosis not present

## 2016-12-14 DIAGNOSIS — Z793 Long term (current) use of hormonal contraceptives: Secondary | ICD-10-CM | POA: Insufficient documentation

## 2016-12-14 DIAGNOSIS — F17210 Nicotine dependence, cigarettes, uncomplicated: Secondary | ICD-10-CM | POA: Diagnosis not present

## 2016-12-14 DIAGNOSIS — Z794 Long term (current) use of insulin: Secondary | ICD-10-CM | POA: Diagnosis not present

## 2016-12-14 DIAGNOSIS — Z79899 Other long term (current) drug therapy: Secondary | ICD-10-CM | POA: Diagnosis not present

## 2016-12-14 DIAGNOSIS — M7522 Bicipital tendinitis, left shoulder: Secondary | ICD-10-CM | POA: Insufficient documentation

## 2016-12-14 HISTORY — PX: SHOULDER ARTHROSCOPY WITH SUBACROMIAL DECOMPRESSION: SHX5684

## 2016-12-14 HISTORY — PX: SHOULDER ARTHROSCOPY WITH OPEN ROTATOR CUFF REPAIR: SHX6092

## 2016-12-14 HISTORY — PX: SHOULDER ARTHROSCOPY WITH BICEPSTENOTOMY: SHX6204

## 2016-12-14 LAB — POCT PREGNANCY, URINE: PREG TEST UR: NEGATIVE

## 2016-12-14 LAB — GLUCOSE, CAPILLARY
GLUCOSE-CAPILLARY: 226 mg/dL — AB (ref 65–99)
Glucose-Capillary: 224 mg/dL — ABNORMAL HIGH (ref 65–99)

## 2016-12-14 SURGERY — ARTHROSCOPY, SHOULDER WITH REPAIR, ROTATOR CUFF, OPEN
Anesthesia: General | Site: Shoulder | Laterality: Left | Wound class: Clean

## 2016-12-14 MED ORDER — PHENYLEPHRINE HCL 10 MG/ML IJ SOLN
INTRAMUSCULAR | Status: DC | PRN
  Administered 2016-12-14: 100 ug via INTRAVENOUS
  Administered 2016-12-14 (×2): 80 ug via INTRAVENOUS
  Administered 2016-12-14: 100 ug via INTRAVENOUS
  Administered 2016-12-14 (×2): 80 ug via INTRAVENOUS
  Administered 2016-12-14: 40 ug via INTRAVENOUS

## 2016-12-14 MED ORDER — PROPOFOL 10 MG/ML IV BOLUS
INTRAVENOUS | Status: AC
  Filled 2016-12-14: qty 40

## 2016-12-14 MED ORDER — ACETAMINOPHEN 10 MG/ML IV SOLN
INTRAVENOUS | Status: DC | PRN
  Administered 2016-12-14: 1000 mg via INTRAVENOUS

## 2016-12-14 MED ORDER — OXYCODONE HCL 5 MG PO TABS: 5.0000 mg | ORAL_TABLET | ORAL | 0 refills | Status: DC | PRN

## 2016-12-14 MED ORDER — FAMOTIDINE 20 MG PO TABS
20.0000 mg | ORAL_TABLET | Freq: Once | ORAL | Status: AC
  Administered 2016-12-14: 20 mg via ORAL

## 2016-12-14 MED ORDER — HYDROMORPHONE HCL 1 MG/ML IJ SOLN
0.2500 mg | INTRAMUSCULAR | Status: DC | PRN
  Administered 2016-12-14: 0.25 mg via INTRAVENOUS
  Administered 2016-12-14: 0.5 mg via INTRAVENOUS
  Administered 2016-12-14: 0.25 mg via INTRAVENOUS

## 2016-12-14 MED ORDER — EPINEPHRINE PF 1 MG/ML IJ SOLN
INTRAMUSCULAR | Status: AC
  Filled 2016-12-14: qty 2

## 2016-12-14 MED ORDER — DEXAMETHASONE SODIUM PHOSPHATE 10 MG/ML IJ SOLN
INTRAMUSCULAR | Status: DC | PRN
  Administered 2016-12-14: 4 mg via INTRAVENOUS

## 2016-12-14 MED ORDER — FENTANYL CITRATE (PF) 100 MCG/2ML IJ SOLN
25.0000 ug | INTRAMUSCULAR | Status: DC | PRN
  Administered 2016-12-14 (×3): 50 ug via INTRAVENOUS

## 2016-12-14 MED ORDER — SCOPOLAMINE 1 MG/3DAYS TD PT72
MEDICATED_PATCH | TRANSDERMAL | Status: AC
  Administered 2016-12-14: 1.5 mg via TRANSDERMAL
  Filled 2016-12-14: qty 1

## 2016-12-14 MED ORDER — SUCCINYLCHOLINE CHLORIDE 20 MG/ML IJ SOLN
INTRAMUSCULAR | Status: DC | PRN
  Administered 2016-12-14: 160 mg via INTRAVENOUS

## 2016-12-14 MED ORDER — FAMOTIDINE 20 MG PO TABS
ORAL_TABLET | ORAL | Status: AC
  Administered 2016-12-14: 20 mg via ORAL
  Filled 2016-12-14: qty 1

## 2016-12-14 MED ORDER — FENTANYL CITRATE (PF) 100 MCG/2ML IJ SOLN
INTRAMUSCULAR | Status: AC
  Administered 2016-12-14: 50 ug via INTRAVENOUS
  Filled 2016-12-14: qty 2

## 2016-12-14 MED ORDER — METOPROLOL TARTRATE 5 MG/5ML IV SOLN
INTRAVENOUS | Status: DC | PRN
  Administered 2016-12-14: 3 mg via INTRAVENOUS
  Administered 2016-12-14: 2 mg via INTRAVENOUS

## 2016-12-14 MED ORDER — EPINEPHRINE PF 1 MG/ML IJ SOLN
INTRAMUSCULAR | Status: DC | PRN
  Administered 2016-12-14: 2 mL

## 2016-12-14 MED ORDER — SUGAMMADEX SODIUM 200 MG/2ML IV SOLN
INTRAVENOUS | Status: DC | PRN
  Administered 2016-12-14: 100 mg via INTRAVENOUS
  Administered 2016-12-14: 200 mg via INTRAVENOUS

## 2016-12-14 MED ORDER — GLYCOPYRROLATE 0.2 MG/ML IJ SOLN
INTRAMUSCULAR | Status: DC | PRN
  Administered 2016-12-14: 0.2 mg via INTRAVENOUS

## 2016-12-14 MED ORDER — ONDANSETRON HCL 4 MG/2ML IJ SOLN: 4.0000 mg | Freq: Four times a day (QID) | INTRAMUSCULAR | Status: DC | PRN

## 2016-12-14 MED ORDER — OXYCODONE HCL 5 MG PO TABS
5.0000 mg | ORAL_TABLET | ORAL | Status: DC | PRN
  Administered 2016-12-14: 10 mg via ORAL

## 2016-12-14 MED ORDER — OXYCODONE HCL 5 MG PO TABS
ORAL_TABLET | ORAL | Status: AC
  Filled 2016-12-14: qty 2

## 2016-12-14 MED ORDER — ZOLPIDEM TARTRATE 5 MG PO TABS: 5.0000 mg | ORAL_TABLET | Freq: Every evening | ORAL | 0 refills | Status: DC | PRN

## 2016-12-14 MED ORDER — FENTANYL CITRATE (PF) 100 MCG/2ML IJ SOLN
INTRAMUSCULAR | Status: DC | PRN
  Administered 2016-12-14: 100 ug via INTRAVENOUS
  Administered 2016-12-14: 50 ug via INTRAVENOUS
  Administered 2016-12-14: 100 ug via INTRAVENOUS

## 2016-12-14 MED ORDER — FENTANYL CITRATE (PF) 250 MCG/5ML IJ SOLN
INTRAMUSCULAR | Status: AC
  Filled 2016-12-14: qty 5

## 2016-12-14 MED ORDER — PROPOFOL 10 MG/ML IV BOLUS
INTRAVENOUS | Status: DC | PRN
  Administered 2016-12-14: 200 mg via INTRAVENOUS

## 2016-12-14 MED ORDER — DEXAMETHASONE SODIUM PHOSPHATE 10 MG/ML IJ SOLN
INTRAMUSCULAR | Status: AC
  Filled 2016-12-14: qty 1

## 2016-12-14 MED ORDER — ONDANSETRON HCL 4 MG/2ML IJ SOLN
INTRAMUSCULAR | Status: DC | PRN
  Administered 2016-12-14: 4 mg via INTRAVENOUS

## 2016-12-14 MED ORDER — METOCLOPRAMIDE HCL 5 MG/ML IJ SOLN: 5.0000 mg | Freq: Three times a day (TID) | INTRAMUSCULAR | Status: DC | PRN

## 2016-12-14 MED ORDER — ACETAMINOPHEN 10 MG/ML IV SOLN
INTRAVENOUS | Status: AC
Start: 2016-12-14 — End: 2016-12-14
  Filled 2016-12-14: qty 100

## 2016-12-14 MED ORDER — LIDOCAINE HCL (PF) 2 % IJ SOLN
INTRAMUSCULAR | Status: AC
  Filled 2016-12-14: qty 2

## 2016-12-14 MED ORDER — SCOPOLAMINE 1 MG/3DAYS TD PT72
1.0000 | MEDICATED_PATCH | TRANSDERMAL | Status: DC
  Administered 2016-12-14: 1.5 mg via TRANSDERMAL

## 2016-12-14 MED ORDER — POTASSIUM CHLORIDE IN NACL 20-0.9 MEQ/L-% IV SOLN
INTRAVENOUS | Status: DC
  Filled 2016-12-14: qty 1000

## 2016-12-14 MED ORDER — SODIUM CHLORIDE 0.9 % IV SOLN
INTRAVENOUS | Status: DC
  Administered 2016-12-14 (×2): via INTRAVENOUS

## 2016-12-14 MED ORDER — ONDANSETRON HCL 4 MG PO TABS: 4.0000 mg | ORAL_TABLET | Freq: Four times a day (QID) | ORAL | Status: DC | PRN

## 2016-12-14 MED ORDER — BUPIVACAINE-EPINEPHRINE (PF) 0.25% -1:200000 IJ SOLN
INTRAMUSCULAR | Status: DC | PRN
  Administered 2016-12-14: 30 mL via PERINEURAL

## 2016-12-14 MED ORDER — PROMETHAZINE HCL 25 MG/ML IJ SOLN: 6.2500 mg | INTRAMUSCULAR | Status: DC | PRN

## 2016-12-14 MED ORDER — LIDOCAINE HCL (CARDIAC) 20 MG/ML IV SOLN
INTRAVENOUS | Status: DC | PRN
  Administered 2016-12-14: 100 mg via INTRAVENOUS

## 2016-12-14 MED ORDER — ROCURONIUM BROMIDE 100 MG/10ML IV SOLN
INTRAVENOUS | Status: DC | PRN
  Administered 2016-12-14: 30 mg via INTRAVENOUS
  Administered 2016-12-14: 20 mg via INTRAVENOUS

## 2016-12-14 MED ORDER — ONDANSETRON HCL 4 MG/2ML IJ SOLN
INTRAMUSCULAR | Status: AC
  Filled 2016-12-14: qty 2

## 2016-12-14 MED ORDER — HYDROMORPHONE HCL 1 MG/ML IJ SOLN
INTRAMUSCULAR | Status: AC
  Administered 2016-12-14: 0.5 mg via INTRAVENOUS
  Filled 2016-12-14: qty 1

## 2016-12-14 MED ORDER — METOCLOPRAMIDE HCL 10 MG PO TABS: 5.0000 mg | ORAL_TABLET | Freq: Three times a day (TID) | ORAL | Status: DC | PRN

## 2016-12-14 MED ORDER — HYDROMORPHONE HCL 1 MG/ML IJ SOLN
INTRAMUSCULAR | Status: AC
  Filled 2016-12-14: qty 1

## 2016-12-14 MED ORDER — OXYCODONE HCL 5 MG PO TABS
10.0000 mg | ORAL_TABLET | Freq: Once | ORAL | Status: DC
  Filled 2016-12-14: qty 2

## 2016-12-14 MED ORDER — BUPIVACAINE-EPINEPHRINE (PF) 0.25% -1:200000 IJ SOLN
INTRAMUSCULAR | Status: AC
  Filled 2016-12-14: qty 30

## 2016-12-14 SURGICAL SUPPLY — 48 items
ANCHOR JUGGERKNOT WTAP NDL 2.9 (Anchor) ×2 IMPLANT
ANCHOR SUT QUATTRO KNTLS 4.5 (Anchor) ×2 IMPLANT
ANCHOR SUT W/ ORTHOCORD (Anchor) ×2 IMPLANT
BIT DRILL JUGRKNT W/NDL BIT2.9 (DRILL) ×1 IMPLANT
BLADE FULL RADIUS 3.5 (BLADE) ×2 IMPLANT
BUR ACROMIONIZER 4.0 (BURR) ×2 IMPLANT
CANNULA SHAVER 8MMX76MM (CANNULA) ×4 IMPLANT
CHLORAPREP W/TINT 26ML (MISCELLANEOUS) ×2 IMPLANT
COVER MAYO STAND STRL (DRAPES) ×2 IMPLANT
DRAPE IMP U-DRAPE 54X76 (DRAPES) ×4 IMPLANT
DRILL JUGGERKNOT W/NDL BIT 2.9 (DRILL) ×2
DRSG OPSITE POSTOP 4X8 (GAUZE/BANDAGES/DRESSINGS) ×2 IMPLANT
ELECT REM PT RETURN 9FT ADLT (ELECTROSURGICAL) ×2
ELECTRODE REM PT RTRN 9FT ADLT (ELECTROSURGICAL) ×1 IMPLANT
GAUZE PETRO XEROFOAM 1X8 (MISCELLANEOUS) ×2 IMPLANT
GAUZE SPONGE 4X4 12PLY STRL (GAUZE/BANDAGES/DRESSINGS) ×2 IMPLANT
GLOVE BIO SURGEON STRL SZ7.5 (GLOVE) ×4 IMPLANT
GLOVE BIO SURGEON STRL SZ8 (GLOVE) ×4 IMPLANT
GLOVE BIOGEL PI IND STRL 8 (GLOVE) ×1 IMPLANT
GLOVE BIOGEL PI INDICATOR 8 (GLOVE) ×1
GLOVE INDICATOR 8.0 STRL GRN (GLOVE) ×2 IMPLANT
GOWN STRL REUS W/ TWL LRG LVL3 (GOWN DISPOSABLE) ×1 IMPLANT
GOWN STRL REUS W/ TWL XL LVL3 (GOWN DISPOSABLE) ×1 IMPLANT
GOWN STRL REUS W/TWL LRG LVL3 (GOWN DISPOSABLE) ×1
GOWN STRL REUS W/TWL XL LVL3 (GOWN DISPOSABLE) ×1
GRASPER SUT 15 45D LOW PRO (SUTURE) ×2 IMPLANT
IV LACTATED RINGER IRRG 3000ML (IV SOLUTION) ×2
IV LR IRRIG 3000ML ARTHROMATIC (IV SOLUTION) ×2 IMPLANT
KIT CANNULA 8X76-LX IN CANNULA (CANNULA) ×2 IMPLANT
MANIFOLD NEPTUNE II (INSTRUMENTS) ×2 IMPLANT
MASK FACE SPIDER DISP (MASK) ×2 IMPLANT
MAT BLUE FLOOR 46X72 FLO (MISCELLANEOUS) ×2 IMPLANT
NEEDLE FILTER BLUNT 18X 1/2SAF (NEEDLE) ×1
NEEDLE FILTER BLUNT 18X1 1/2 (NEEDLE) ×1 IMPLANT
NEEDLE REVERSE CUT 1/2 CRC (NEEDLE) IMPLANT
PACK ARTHROSCOPY SHOULDER (MISCELLANEOUS) ×2 IMPLANT
SLING ARM LRG DEEP (SOFTGOODS) IMPLANT
SLING ULTRA II LG (MISCELLANEOUS) ×2 IMPLANT
STAPLER SKIN PROX 35W (STAPLE) ×2 IMPLANT
STRAP SAFETY BODY (MISCELLANEOUS) ×2 IMPLANT
SUT ETHIBOND 0 MO6 C/R (SUTURE) ×2 IMPLANT
SUT VIC AB 2-0 CT1 27 (SUTURE) ×2
SUT VIC AB 2-0 CT1 TAPERPNT 27 (SUTURE) ×2 IMPLANT
SYR 5ML LL (SYRINGE) ×2 IMPLANT
TAPE MICROFOAM 4IN (TAPE) ×2 IMPLANT
TUBING ARTHRO INFLOW-ONLY STRL (TUBING) ×2 IMPLANT
TUBING CONNECTING 10 (TUBING) ×2 IMPLANT
WAND HAND CNTRL MULTIVAC 90 (MISCELLANEOUS) ×2 IMPLANT

## 2016-12-14 NOTE — Op Note (Signed)
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  Procedure: Limited arthroscopic debridement, arthroscopic subscapularis tendon repair, arthroscopic biceps tendon lysis, arthroscopic subacromial decompression, and mini-open rotator cuff repair, left shoulder.  Anesthesia: GET  Surgeon:   Pascal Lux, MD  Assistant:   Cameron Proud, PA-C; Di Kindle, PA-S  Findings: As above. There were focal grade 1-2 chondral malacia changes involving the central portion of the glenoid, but the articular surface of the humeral head appeared to be satisfactory. There was a partial thickness tear (50%) involving the superior articular fibers of the subscapularis tendon, and a near full-thickness bursal surface tear involving the anterior insertional fibers of the supraspinatus tendon. There was extensive degenerative fraying of the superior portion of the labrum without frank detachment from the glenoid, as well as mild-moderate tendinopathic changes of the biceps tendon.  Complications: None  Fluids:   700 cc  Estimated blood loss: 5 cc  Tourniquet time: None  Drains: None  Closure: Staples   Brief clinical note: The patient is a 51 year old female with a history of progressively worsening left shoulder pain. The patient's symptoms have progressed despite medications, activity modification, etc. The patient's history and examination are consistent with impingement/tendinopathy with a probable rotator cuff tear. These findings were confirmed by MRI scan. The patient presents at this time for definitive management of these shoulder symptoms.  Procedure: The patient was brought into  the operating room and lain in the supine position. After adequate general endotracheal intubation and anesthesia was obtained, the patient was repositioned in the beach chair position using the beach chair positioner. The left shoulder and upper extremity were prepped with ChloraPrep solution before being draped sterilely. Preoperative antibiotics were administered. A timeout was performed to confirm the proper surgical site before the expected portal sites and incision site were injected with 0.5% Sensorcaine with epinephrine. A posterior portal was created and the glenohumeral joint thoroughly inspected with the findings as described above. An anterior portal was created using an outside-in technique. The labrum and rotator cuff were further probed, again confirming the above-noted findings. The areas of labral fraying were debrided back to stable margins, as were the torn portions of the supraspinatus and subscapularis tendons. The ArthroCare wand was inserted and used to release the biceps tendon from its labral attachment, as well as to obtain hemostasis and to "anneal" the labrum superiorly and anteriorly. A separate superolateral portal site was created using an outside in technique before the exposed portion of the lesser tuberosity was roughened with a full-radius resector. The superior portion of the subscapularis tendon was repaired using a single Mitek BioKnotless anchor placed through the anterior portal. The repair was deemed stable both to passive external rotation as well as with probing.  The instruments were removed from the joint after suctioning the excess fluid.  The camera was repositioned through the posterior portal into the subacromial space. A separate lateral portal was created using an outside-in technique. The 3.5 mm full-radius resector was introduced and used to perform a subtotal bursectomy. The ArthroCare wand was then inserted and used to remove the periosteal tissue off the  undersurface of the anterior third of the acromion as well as to recess the coracoacromial ligament from its attachment along the anterior and lateral margins of the acromion.  The 4.0 mm acromionizing bur was introduced and used to complete the decompression by removing the undersurface of the anterior third of the acromion. The full radius resector was reintroduced to remove any residual bony debris before the ArthroCare wand was reintroduced to obtain hemostasis. The instruments were then removed from the subacromial space after suctioning the excess fluid.  An approximately 4-4.5 cm incision was made over the anterolateral aspect of the shoulder beginning at the anterolateral corner of the acromion and extending distally in line with the bicipital groove, incorporating the superolateral portal site. This incision was carried down through the subcutaneous tissues to expose the deltoid fascia. The raphae between the anterior and middle thirds was identified and this plane developed to provide access into the subacromial space. Additional bursal tissues were debrided sharply using Metzenbaum scissors. The rotator cuff tear was readily identified. The margins were debrided sharply with a #15 blade and the exposed greater tuberosity roughened with a rongeur. The tear was repaired using a single Biomet 2.9 mm JuggerKnot anchor. These sutures were then brought back laterally and secured using a [employer] anchor to create a two-layer closure. An apparent watertight closure was obtained.   The wound was copiously irrigated with sterile saline solution before the deltoid raphae was reapproximated using 2-0 Vicryl interrupted sutures. The subcutaneous tissues were closed in two layers using 2-0 Vicryl interrupted sutures before the skin was closed using staples. The portal sites also were closed using staples. A sterile bulky dressing was applied to the shoulder before the arm was placed into a shoulder  immobilizer. The patient was then awakened, extubated, and returned to the recovery room in satisfactory condition after tolerating the procedure well.

## 2016-12-14 NOTE — H&P (Signed)
Paper H&P to be scanned into permanent record. H&P reviewed. No changes. 

## 2016-12-14 NOTE — Anesthesia Procedure Notes (Signed)
Procedure Name: Intubation Date/Time: 12/14/2016 12:34 PM Performed by: Darlyne Russian Pre-anesthesia Checklist: Patient identified, Patient being monitored, Timeout performed, Emergency Drugs available and Suction available Patient Re-evaluated:Patient Re-evaluated prior to inductionOxygen Delivery Method: Circle system utilized Preoxygenation: Pre-oxygenation with 100% oxygen Intubation Type: IV induction Ventilation: Mask ventilation without difficulty Laryngoscope Size: Mac and 3 Grade View: Grade I Tube type: Oral Tube size: 7.0 mm Number of attempts: 1 Airway Equipment and Method: Stylet Placement Confirmation: ETT inserted through vocal cords under direct vision,  positive ETCO2 and breath sounds checked- equal and bilateral Secured at: 22 cm Tube secured with: Tape Dental Injury: Teeth and Oropharynx as per pre-operative assessment

## 2016-12-14 NOTE — Anesthesia Post-op Follow-up Note (Cosign Needed)
Anesthesia QCDR form completed.        

## 2016-12-14 NOTE — Anesthesia Preprocedure Evaluation (Signed)
Anesthesia Evaluation  Patient identified by MRN, date of birth, ID band Patient awake    Reviewed: Allergy & Precautions, H&P , NPO status , Patient's Chart, lab work & pertinent test results, reviewed documented beta blocker date and time   History of Anesthesia Complications Negative for: history of anesthetic complications  Airway Mallampati: I  TM Distance: >3 FB Neck ROM: full    Dental  (+) Missing, Poor Dentition   Pulmonary neg shortness of breath, asthma , sleep apnea , neg COPD, neg recent URI, Current Smoker,           Cardiovascular Exercise Tolerance: Good hypertension, (-) angina(-) CAD, (-) Past MI, (-) Cardiac Stents and (-) CABG (-) dysrhythmias (-) Valvular Problems/Murmurs     Neuro/Psych neg Seizures  Neuromuscular disease negative psych ROS   GI/Hepatic Neg liver ROS, GERD  ,  Endo/Other  diabetesMorbid obesity  Renal/GU negative Renal ROS  negative genitourinary   Musculoskeletal   Abdominal   Peds  Hematology negative hematology ROS (+)   Anesthesia Other Findings Past Medical History: No date: Arthritis No date: Asthma     Comment: WELL CONTROLLED 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 No date: Diabetes mellitus without complication (HCC) No date: Hypertension No date: Sleep apnea     Comment: USES CPAP   Reproductive/Obstetrics negative OB ROS                             Anesthesia Physical Anesthesia Plan  ASA: III  Anesthesia Plan: General   Post-op Pain Management:    Induction:   Airway Management Planned:   Additional Equipment:   Intra-op Plan:   Post-operative Plan:   Informed Consent: I have reviewed the patients History and Physical, chart, labs and discussed the procedure including the risks, benefits and alternatives for the proposed anesthesia with the patient or authorized representative who has indicated his/her understanding and acceptance.    Dental Advisory Given  Plan Discussed with: Anesthesiologist, CRNA and Surgeon  Anesthesia Plan Comments:         Anesthesia Quick Evaluation

## 2016-12-14 NOTE — Transfer of Care (Addendum)
Immediate Anesthesia Transfer of Care Note  Patient: Brenda Hicks  Procedure(s) Performed: Procedure(s): SHOULDER ARTHROSCOPY WITH OPEN ROTATOR CUFF REPAIR AND ARTHROSCOPIC ROTATOR CUFF REPAIR (Left) SHOULDER ARTHROSCOPY WITH SUBACROMIAL DECOMPRESSION (Left) SHOULDER ARTHROSCOPY WITH BICEPSTENOTOMY (Left)  Patient Location: PACU  Anesthesia Type:General  Level of Consciousness: awake, alert , oriented and patient cooperative  Airway & Oxygen Therapy: Patient Spontanous Breathing and Patient connected to face mask oxygen  Post-op Assessment: Report given to RN, Post -op Vital signs reviewed and stable and Patient moving all extremities  Post vital signs: Reviewed and stable  Last Vitals:  Vitals:   12/14/16 1155 12/14/16 1430  BP: 136/78   Pulse: 98   Resp: 20   Temp: (!) 35.9 C (P) 36.3 C    Last Pain:  Vitals:   12/14/16 1155  TempSrc: Tympanic  PainSc: 5          Complications: No apparent anesthesia complications

## 2016-12-14 NOTE — Discharge Instructions (Addendum)
Keep dressing dry and intact.  °May shower after dressing changed on post-op day #4 (Saturday).  °Cover staples with Band-Aids after drying off. °Apply ice frequently to shoulder. °Take ibuprofen 800 mg TID with meals for 7-10 days, then as necessary. °Take oxycodone as prescribed when needed.  °May supplement with ES Tylenol if necessary. °Keep shoulder immobilizer on at all times except may remove for bathing purposes. °Follow-up in 10-14 days or as scheduled. ° ° ° °AMBULATORY SURGERY  °DISCHARGE INSTRUCTIONS ° ° °1) The drugs that you were given will stay in your system until tomorrow so for the next 24 hours you should not: ° °A) Drive an automobile °B) Make any legal decisions °C) Drink any alcoholic beverage ° ° °2) You may resume regular meals tomorrow.  Today it is better to start with liquids and gradually work up to solid foods. ° °You may eat anything you prefer, but it is better to start with liquids, then soup and crackers, and gradually work up to solid foods. ° ° °3) Please notify your doctor immediately if you have any unusual bleeding, trouble breathing, redness and pain at the surgery site, drainage, fever, or pain not relieved by medication. ° ° ° °4) Additional Instructions: ° ° ° ° ° ° ° °Please contact your physician with any problems or Same Day Surgery at [phone], Monday through Friday 6 am to 4 pm, or [place] [facility] number at [phone]. °

## 2016-12-15 ENCOUNTER — Encounter: Payer: Self-pay | Admitting: Surgery

## 2016-12-15 NOTE — Anesthesia Postprocedure Evaluation (Signed)
Anesthesia Post Note  Patient: Brenda Hicks  Procedure(s) Performed: Procedure(s) (LRB): SHOULDER ARTHROSCOPY WITH OPEN ROTATOR CUFF REPAIR AND ARTHROSCOPIC ROTATOR CUFF REPAIR (Left) SHOULDER ARTHROSCOPY WITH SUBACROMIAL DECOMPRESSION (Left) SHOULDER ARTHROSCOPY WITH BICEPSTENOTOMY (Left)  Patient location during evaluation: PACU Anesthesia Type: General Level of consciousness: awake and alert Pain management: pain level controlled Vital Signs Assessment: post-procedure vital signs reviewed and stable Respiratory status: spontaneous breathing, nonlabored ventilation, respiratory function stable and patient connected to nasal cannula oxygen Cardiovascular status: blood pressure returned to baseline and stable Postop Assessment: no signs of nausea or vomiting Anesthetic complications: no     Last Vitals:  Vitals:   12/14/16 1532 12/14/16 1624  BP: (!) 172/80 (!) 170/80  Pulse: 99 (!) 101  Resp: 16 16  Temp: 36.3 C     Last Pain:  Vitals:   12/14/16 1640  TempSrc:   PainSc: 4                  Martha Clan

## 2016-12-19 ENCOUNTER — Encounter: Payer: Self-pay | Admitting: Surgery

## 2017-02-12 ENCOUNTER — Emergency Department
Admission: EM | Admit: 2017-02-12 | Discharge: 2017-02-12 | Disposition: A | Discharge status: Home / Self Care | Payer: Medicaid Other | Attending: Emergency Medicine | Admitting: Emergency Medicine

## 2017-02-12 DIAGNOSIS — Z794 Long term (current) use of insulin: Secondary | ICD-10-CM | POA: Insufficient documentation

## 2017-02-12 DIAGNOSIS — Z85828 Personal history of other malignant neoplasm of skin: Secondary | ICD-10-CM | POA: Diagnosis not present

## 2017-02-12 DIAGNOSIS — Z79899 Other long term (current) drug therapy: Secondary | ICD-10-CM | POA: Insufficient documentation

## 2017-02-12 DIAGNOSIS — M5410 Radiculopathy, site unspecified: Secondary | ICD-10-CM | POA: Diagnosis not present

## 2017-02-12 DIAGNOSIS — E119 Type 2 diabetes mellitus without complications: Secondary | ICD-10-CM | POA: Diagnosis not present

## 2017-02-12 DIAGNOSIS — J45909 Unspecified asthma, uncomplicated: Secondary | ICD-10-CM | POA: Insufficient documentation

## 2017-02-12 DIAGNOSIS — M25511 Pain in right shoulder: Secondary | ICD-10-CM | POA: Insufficient documentation

## 2017-02-12 DIAGNOSIS — G8929 Other chronic pain: Secondary | ICD-10-CM | POA: Insufficient documentation

## 2017-02-12 DIAGNOSIS — I10 Essential (primary) hypertension: Secondary | ICD-10-CM | POA: Diagnosis not present

## 2017-02-12 DIAGNOSIS — F17210 Nicotine dependence, cigarettes, uncomplicated: Secondary | ICD-10-CM | POA: Diagnosis not present

## 2017-02-12 DIAGNOSIS — Z7982 Long term (current) use of aspirin: Secondary | ICD-10-CM | POA: Insufficient documentation

## 2017-02-12 MED ORDER — NAPROXEN SODIUM 275 MG PO TABS: 275.0000 mg | ORAL_TABLET | Freq: Two times a day (BID) | ORAL | 0 refills | Status: DC

## 2017-02-12 MED ORDER — NAPROXEN 250 MG PO TABS
500.0000 mg | ORAL_TABLET | Freq: Once | ORAL | Status: AC
  Administered 2017-02-12: 500 mg via ORAL
  Filled 2017-02-12: qty 2

## 2017-02-12 NOTE — Discharge Instructions (Signed)
Return to the emergency room for any new or worrisome symptoms including weakness, change in chronic numbness, or you feel worse in any way. Follow closely primary care doctor as well as neurosurgery and your surgeon.

## 2017-02-12 NOTE — ED Provider Notes (Addendum)
[facility] Emergency Department Provider Note  ____________________________________________   I have reviewed the triage vital signs and the nursing notes.   HISTORY  Chief Complaint Shoulder Pain (bilateral) and Neck Pain    HPI Brenda Hicks is a 51 y.o. female very well-known to this emergency room for multiple different pain related complaints and also goes to other emergency departments for similar. Patient had recent surgery, 7 weeks ago approximately on her left shoulder for chronic arthritis. She states she has chronic arthritis in the right shoulder. She states the plan is for her surgeon to take care of her chronic right shoulder arthritis after he is done with her left shoulder. Patient also has chronic neck pain, she states she has chronic tingling in her arms which is been going on for years, bilaterally, and all these problems are concerning her today. She did not have any recent fall. She denies any change in her strength. She states that she's had no fever chills no chest pain or shortness of breath. She denies any incontinence of bowel or bladder or other neurologic symptoms. She states that when her shoulder pain on her bilateral shoulders gets bad from her arthritis she has tingling. She has had no other focal neurologic complaints. She also has trapezius muscle strain she thinks from unusual motions after her surgery. He denies headache or stiff neck she denies fever  Review of deviate database reveals the patient has had a different narcotic prescription. He much every week since January 23, and she states she ran out about a week ago. Has been all this pain yet bad again. Patient has been seen in the emergency room for these complaints in the past he states. There are notes going back and other department suggestive of the chronicity of this complaint.   Past Medical History:  Diagnosis Date  . Arthritis   . Asthma    WELL CONTROLLED  . Cancer  of ear   . Diabetes mellitus without complication ([place])   . Hypertension   . Sleep apnea    USES CPAP    Patient Active Problem List   Diagnosis Date Noted  . Sebaceous cyst 06/01/2016  . Obstructive apnea 05/31/2016  . HLD (hyperlipidemia) 05/31/2016  . Acid reflux 05/31/2016  . Essential (primary) hypertension 05/31/2016  . Abnormal Pap smear of cervix 05/31/2016  . Arthritis of knee, degenerative 07/25/2015  . History of artificial joint 07/25/2015  . Gonalgia 02/17/2015  . Type 2 diabetes mellitus ([place]) 10/23/2014  . Otalgia of left ear 09/24/2014  . 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 Diabetic peripheral neuropathy associated with type 2 diabetes mellitus ([place]) 05/16/2014  . Endometrial polyp 11/01/2013  . Fibroids, intramural 10/09/2013  . Pain due to knee joint prosthesis ([place]) 10/05/2013  . Body mass index (BMI) of 50-59.9 in adult ([place]) 10/03/2013  . Abnormal uterine bleeding 10/03/2013  . Adiposity 10/01/2013  . Excessive and frequent menstruation with irregular cycle 10/01/2013  . Adaptive colitis 10/01/2013  . History of migraine headaches 10/01/2013  . H/O malignant neoplasm of skin 10/01/2013  . Fatty liver disease, nonalcoholic 32/44/0102  . Diverticulitis 10/01/2013  . Current tobacco use 08/29/2013  . H/O total knee replacement 08/29/2013  . Cannot sleep 08/29/2013  . Dysmenorrhea 08/29/2013  . Airway hyperreactivity 08/29/2013  . Absolute anemia 08/29/2013  . Tobacco use 08/29/2013    Past Surgical History:  Procedure Laterality Date  . DILATION  AND CURETTAGE OF UTERUS    . ENDOMETRIAL BIOPSY    . EXPLORATORY LAPAROTOMY     REMOVAL OF RUPTURED ECTOPIC  . HAND SURGERY    . HERNIA REPAIR     UMBILICAL  . JOINT REPLACEMENT Right    TKR  . KNEE ARTHROSCOPY    . KNEE SURGERY Right   . SHOULDER ARTHROSCOPY WITH BICEPSTENOTOMY Left 12/14/2016   Procedure:  SHOULDER ARTHROSCOPY WITH BICEPSTENOTOMY;  Surgeon: Corky Mull, MD;  Location: [facility] ORS;  Service: Orthopedics;  Laterality: Left;  . SHOULDER ARTHROSCOPY WITH OPEN ROTATOR CUFF REPAIR Left 12/14/2016   Procedure: SHOULDER ARTHROSCOPY WITH OPEN ROTATOR CUFF REPAIR AND ARTHROSCOPIC ROTATOR CUFF REPAIR;  Surgeon: Corky Mull, MD;  Location: [facility] ORS;  Service: Orthopedics;  Laterality: Left;  . SHOULDER ARTHROSCOPY WITH SUBACROMIAL DECOMPRESSION Left 12/14/2016   Procedure: SHOULDER ARTHROSCOPY WITH SUBACROMIAL DECOMPRESSION;  Surgeon: Corky Mull, MD;  Location: [facility] ORS;  Service: Orthopedics;  Laterality: Left;  . TUBAL LIGATION      Prior to Admission medications   Medication Sig Start Date End Date Taking? Authorizing Provider  albuterol (PROVENTIL HFA;VENTOLIN HFA) 108 (90 Base) MCG/ACT inhaler Inhale 2 puffs into the lungs every 6 (six) hours as needed for wheezing or shortness of breath.    Historical Provider, MD  aspirin EC 81 MG tablet Take 81 mg by mouth daily.  01/14/16   Historical Provider, MD  atorvastatin (LIPITOR) 40 MG tablet Take 1 tablet (40 mg total) by mouth daily. Patient not taking: Reported on 12/01/2016 12/07/15 12/06/16  Johnn Hai, PA-C  gabapentin (NEURONTIN) 300 MG capsule Take 300 mg by mouth 3 (three) times daily as needed (for neuropathy pain.).     Historical Provider, MD  ibuprofen (ADVIL,MOTRIN) 800 MG tablet Take 1 tablet (800 mg total) by mouth every 8 (eight) hours as needed. Patient not taking: Reported on 12/01/2016 07/26/16   Pierce Crane Beers, PA-C  insulin glargine (LANTUS) 100 UNIT/ML injection Inject 100 Units into the skin at bedtime.     Historical Provider, MD  insulin lispro (HUMALOG) 100 UNIT/ML injection Inject 20 Units into the skin 3 (three) times daily with meals.  01/14/16 01/13/17  Historical Provider, MD  levonorgestrel (MIRENA) 20 MCG/24HR IUD 1 each by Intrauterine route once.    Historical Provider, MD  lisinopril (PRINIVIL,ZESTRIL) 40 MG  tablet Take 40 mg by mouth every evening.  01/14/16 01/13/17  Historical Provider, MD  metFORMIN (GLUCOPHAGE) 1000 MG tablet Take 1,000 mg by mouth 2 (two) times daily.     Historical Provider, MD  oxyCODONE (ROXICODONE) 5 MG immediate release tablet Take 1-2 tablets (5-10 mg total) by mouth every 4 (four) hours as needed for severe pain. 12/14/16   Corky Mull, MD  zolpidem (AMBIEN) 5 MG tablet Take 1 tablet (5 mg total) by mouth at bedtime as needed for sleep. 12/14/16   Corky Mull, MD    Allergies Patient has no known allergies.  History reviewed. No pertinent family history.  Social History Social History  Substance Use Topics  . Smoking status: Current Every Day Smoker    Packs/day: 0.50    Years: 25.00    Types: Cigarettes  . Smokeless tobacco: Never Used  . Alcohol use No    Review of Systems Constitutional: No fever/chills Eyes: No visual changes. ENT: No sore throat. No stiff neck no neck pain Cardiovascular: Denies chest pain. Respiratory: Denies shortness of breath. Gastrointestinal:   no vomiting.  No diarrhea.  No constipation. Genitourinary: Negative for dysuria. Musculoskeletal: Negative lower extremity swelling Skin: Negative for rash. Neurological: Negative for severe headaches, focal weakness or numbness. 10-point ROS otherwise negative.  ____________________________________________   PHYSICAL EXAM:  VITAL SIGNS: ED Triage Vitals  Enc Vitals Group     BP 02/12/17 0624 (!) 151/111     Pulse Rate 02/12/17 0624 97     Resp 02/12/17 0624 16     Temp 02/12/17 0624 97.8 F (36.6 C)     Temp Source 02/12/17 0624 Oral     SpO2 02/12/17 0624 98 %     Weight 02/12/17 0625 285 lb (129.3 kg)     Height 02/12/17 0625 5\' 5"  (1.651 m)     Head Circumference --      Peak Flow --      Pain Score 02/12/17 0625 10     Pain Loc --      Pain Edu? --      Excl. in [place]? --     Constitutional: Alert and oriented. Well appearing and in no acute distress. Eyes:  Conjunctivae are normal. PERRL. EOMI. Head: Atraumatic. Nose: No congestion/rhinnorhea. Mouth/Throat: Mucous membranes are moist.  Oropharynx non-erythematous. Neck: No stridor.   There is some trapezius muscle bilaterally no midline tenderness, mild muscle spasm appreciated. Otherwise full range of motion of the neck Cardiovascular: Normal rate, regular rhythm. Grossly normal heart sounds.  Good peripheral circulation. Respiratory: Normal respiratory effort.  No retractions. Lungs CTAB. Abdominal: Soft and nontender. No distention. No guarding no rebound Back:  There is no focal tenderness or step off.  there is no midline tenderness there are no lesions noted. there is no CVA tenderness Musculoskeletal: No lower extremity tenderness, no the surgical site in the left shoulder is clean dry and intact well healed with no evidence of inflammation erythema or infection, the right shoulder is not warm to touch, there is full range of motion but it is somewhat tender. Especially over 90. No joint effusions, no DVT signs strong distal pulses no edema Neurologic:  Normal speech and language. No gross focal neurologic deficits are appreciated. Patient has intact strength bilaterally upper extremity with good grip strength, good push pull. Station is symmetric. No obvious focal neurologic deficits appreciated. No saddle anesthesia declines rectal Skin:  Skin is warm, dry and intact. No rash noted. Psychiatric: Mood and affect are normal. Speech and behavior are normal.  ____________________________________________   LABS (all labs ordered are listed, but only abnormal results are displayed)  Labs Reviewed - No data to display ____________________________________________  EKG  I personally interpreted any EKGs ordered by me or triage normal sinus rhythm at 77 beats per an acute ST elevation no acute ST depression normal axis unremarkable EKG  ____________________________________________  RADIOLOGY  I reviewed any imaging ordered by me or triage that were performed during my shift and, if possible, patient and/or family made aware of any abnormal findings. ____________________________________________   PROCEDURES  Procedure(s) performed: None  Procedures  Critical Care performed: None  ____________________________________________   INITIAL IMPRESSION / ASSESSMENT AND PLAN / ED COURSE  Pertinent labs & imaging results that were available during my care of the patient were reviewed by me and considered in my medical decision making (see chart for details).  Patient with chronic pain issues revolving around her shoulders and arthritis there as well as recurrent tingling sensations in her arm for several years presents today with the above complaints. Patient states that she needs medication to "get comfortable".  I'm somewhat concerned about the amount of narcotic use this patient has had over the last 2 months. She's had approximately 212 of her narcotic pills prescribed to her since January 23. This is a significant amount of narcotic pain medication for what at this time is probably more like chronic rather than acute postsurgical pain. I am concerned, always, when someone states that they're having tingling; this is bilateral tingling, however her strength is intact, on exam her sensation appears to be intact there is no evidence of decompensated radiculopathy leading to significant neurologic pathology, she treats this to her shoulder pain. There is no evidence of lower neurologic symptoms as one might expect from a neck injury although certainly possible this is radicular in nature and I have advised her of that. Nonetheless, it has been going on for years by her description and I don't think a Saturday morning emergent MRI is indicated.   Otherwise, there is no evidence of ACS PE dissection stroke or other acute pathology  today. Very reproducible chronic pain issues. Patient's primary concern is obtaining pain medication but I am reluctant to give her narcotics for the above-mentioned reasons. We have a very strong policy about narcotic pain medication in this hospital for chronic pain issues and given the high number of prescription narcotics she has had over the last few months we will avoid giving her more from an emergent perspective. Patient may need referral to a pain medication. Extensive return precautions and follow-up given and understood including if she has any new or worrisome neurologic symptoms since Friday comfortable with this plan. We will have her follow closely with her orthopedic surgeon and I can refer her also to neurosurgery for the tingling issue. We will treat her with nonsteroidal pain medication for what is likely arthritic issues in her shoulders I don't think a acute x-ray would likely be revealing. Nothing to suggest postoperative infection or compartment syndrome etc.    ____________________________________________   FINAL CLINICAL IMPRESSION(S) / ED DIAGNOSES  Final diagnoses:  None      This chart was dictated using voice recognition software.  Despite best efforts to proofread,  errors can occur which can change meaning.     Schuyler Amor, MD 02/12/17 9735    Schuyler Amor, MD 02/12/17 [place], MD 02/12/17 [phone]

## 2017-02-12 NOTE — ED Triage Notes (Signed)
Patient reports hx of left rotator cuff surgery approx 7 weeks ago.   Patient c/o left and right shoulder pain, and neck pain. Pt reports numbness/tingling down bilateral arms

## 2017-03-22 ENCOUNTER — Other Ambulatory Visit: Payer: Self-pay | Admitting: Student

## 2017-03-22 DIAGNOSIS — M75122 Complete rotator cuff tear or rupture of left shoulder, not specified as traumatic: Secondary | ICD-10-CM

## 2017-03-28 ENCOUNTER — Encounter: Payer: Self-pay | Admitting: Emergency Medicine

## 2017-03-28 ENCOUNTER — Emergency Department
Admission: EM | Admit: 2017-03-28 | Discharge: 2017-03-29 | Disposition: A | Discharge status: Home / Self Care | Payer: Medicaid Other | Attending: Emergency Medicine | Admitting: Emergency Medicine

## 2017-03-28 DIAGNOSIS — R1110 Vomiting, unspecified: Secondary | ICD-10-CM

## 2017-03-28 DIAGNOSIS — K5792 Diverticulitis of intestine, part unspecified, without perforation or abscess without bleeding: Secondary | ICD-10-CM | POA: Diagnosis not present

## 2017-03-28 DIAGNOSIS — Z79899 Other long term (current) drug therapy: Secondary | ICD-10-CM | POA: Diagnosis not present

## 2017-03-28 DIAGNOSIS — R1011 Right upper quadrant pain: Secondary | ICD-10-CM | POA: Diagnosis present

## 2017-03-28 DIAGNOSIS — F17210 Nicotine dependence, cigarettes, uncomplicated: Secondary | ICD-10-CM | POA: Insufficient documentation

## 2017-03-28 DIAGNOSIS — I10 Essential (primary) hypertension: Secondary | ICD-10-CM | POA: Diagnosis not present

## 2017-03-28 DIAGNOSIS — Z794 Long term (current) use of insulin: Secondary | ICD-10-CM | POA: Insufficient documentation

## 2017-03-28 DIAGNOSIS — Z7982 Long term (current) use of aspirin: Secondary | ICD-10-CM | POA: Diagnosis not present

## 2017-03-28 DIAGNOSIS — E119 Type 2 diabetes mellitus without complications: Secondary | ICD-10-CM | POA: Diagnosis not present

## 2017-03-28 DIAGNOSIS — Z8522 Personal history of malignant neoplasm of nasal cavities, middle ear, and accessory sinuses: Secondary | ICD-10-CM | POA: Diagnosis not present

## 2017-03-28 DIAGNOSIS — J45909 Unspecified asthma, uncomplicated: Secondary | ICD-10-CM | POA: Diagnosis not present

## 2017-03-28 LAB — CBC
HEMATOCRIT: 49 % — AB (ref 35.0–47.0)
Hemoglobin: 16.1 g/dL — ABNORMAL HIGH (ref 12.0–16.0)
MCH: 30.4 pg (ref 26.0–34.0)
MCHC: 32.7 g/dL (ref 32.0–36.0)
MCV: 92.8 fL (ref 80.0–100.0)
Platelets: 276 10*3/uL (ref 150–440)
RBC: 5.29 MIL/uL — AB (ref 3.80–5.20)
RDW: 14.9 % — ABNORMAL HIGH (ref 11.5–14.5)
WBC: 9.6 10*3/uL (ref 3.6–11.0)

## 2017-03-28 LAB — COMPREHENSIVE METABOLIC PANEL
ALBUMIN: 4.2 g/dL (ref 3.5–5.0)
ALK PHOS: 92 U/L (ref 38–126)
ALT: 21 U/L (ref 14–54)
ANION GAP: 7 (ref 5–15)
AST: 23 U/L (ref 15–41)
BILIRUBIN TOTAL: 0.5 mg/dL (ref 0.3–1.2)
BUN: 14 mg/dL (ref 6–20)
CALCIUM: 9.3 mg/dL (ref 8.9–10.3)
CO2: 32 mmol/L (ref 22–32)
Chloride: 101 mmol/L (ref 101–111)
Creatinine, Ser: 0.87 mg/dL (ref 0.44–1.00)
GFR calc non Af Amer: >60 mL/min (ref >60)
GLUCOSE: 198 mg/dL — AB (ref 65–99)
POTASSIUM: 4.1 mmol/L (ref 3.5–5.1)
SODIUM: 140 mmol/L (ref 135–145)
TOTAL PROTEIN: 8.3 g/dL — AB (ref 6.5–8.1)

## 2017-03-28 LAB — URINALYSIS, COMPLETE (UACMP) WITH MICROSCOPIC
BACTERIA UA: NONE SEEN
BILIRUBIN URINE: NEGATIVE
GLUCOSE, UA: NEGATIVE mg/dL
KETONES UR: NEGATIVE mg/dL
NITRITE: NEGATIVE
PROTEIN: NEGATIVE mg/dL
Specific Gravity, Urine: 1.028 (ref 1.005–1.030)
pH: 5 (ref 5.0–8.0)

## 2017-03-28 LAB — LIPASE, BLOOD: Lipase: 41 U/L (ref 11–51)

## 2017-03-28 LAB — POCT PREGNANCY, URINE: PREG TEST UR: NEGATIVE

## 2017-03-28 MED ORDER — MORPHINE SULFATE (PF) 4 MG/ML IV SOLN
4.0000 mg | Freq: Once | INTRAVENOUS | Status: AC
  Administered 2017-03-29: 4 mg via INTRAVENOUS
  Filled 2017-03-28: qty 1

## 2017-03-28 MED ORDER — ONDANSETRON HCL 4 MG/2ML IJ SOLN
4.0000 mg | Freq: Once | INTRAMUSCULAR | Status: AC
  Administered 2017-03-29: 4 mg via INTRAVENOUS
  Filled 2017-03-28: qty 2

## 2017-03-28 NOTE — ED Provider Notes (Signed)
[facility] Emergency Department Provider Note    First MD Initiated Contact with Patient 03/28/17 2349     (approximate)  I have reviewed the triage vital signs and the nursing notes.   HISTORY  Chief Complaint Abdominal Pain   HPI Brenda Hicks is a 51 y.o. female presents with 10 out of 10 upper abdominal pain intermittently for the past 4 days. Patient states that the pain is sharp (like contractions) and worsened with eating or drinking. Patient denies any fever no diarrhea or constipation. Patient denies any fever no urinary symptoms.   Past Medical History:  Diagnosis Date  . Arthritis   . Asthma    WELL CONTROLLED  . Cancer of ear   . Diabetes mellitus without complication ([place])   . Hypertension   . Sleep apnea    USES CPAP    Patient Active Problem List   Diagnosis Date Noted  . Sebaceous cyst 06/01/2016  . Obstructive apnea 05/31/2016  . HLD (hyperlipidemia) 05/31/2016  . Acid reflux 05/31/2016  . Essential (primary) hypertension 05/31/2016  . Abnormal Pap smear of cervix 05/31/2016  . Arthritis of knee, degenerative 07/25/2015  . History of artificial joint 07/25/2015  . Gonalgia 02/17/2015  . Type 2 diabetes mellitus ([place]) 10/23/2014  . Otalgia of left ear 09/24/2014  . 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 Diabetic peripheral neuropathy associated with type 2 diabetes mellitus ([place]) 05/16/2014  . Endometrial polyp 11/01/2013  . Fibroids, intramural 10/09/2013  . Pain due to knee joint prosthesis ([place]) 10/05/2013  . Body mass index (BMI) of 50-59.9 in adult ([place]) 10/03/2013  . Abnormal uterine bleeding 10/03/2013  . Adiposity 10/01/2013  . Excessive and frequent menstruation with irregular cycle 10/01/2013  . Adaptive colitis 10/01/2013  . History of migraine headaches 10/01/2013  . H/O malignant neoplasm of skin 10/01/2013  . Fatty  liver disease, nonalcoholic 13/24/4010  . Diverticulitis 10/01/2013  . Current tobacco use 08/29/2013  . H/O total knee replacement 08/29/2013  . Cannot sleep 08/29/2013  . Dysmenorrhea 08/29/2013  . Airway hyperreactivity 08/29/2013  . Absolute anemia 08/29/2013  . Tobacco use 08/29/2013    Past Surgical History:  Procedure Laterality Date  . DILATION AND CURETTAGE OF UTERUS    . ENDOMETRIAL BIOPSY    . EXPLORATORY LAPAROTOMY     REMOVAL OF RUPTURED ECTOPIC  . HAND SURGERY    . HERNIA REPAIR     UMBILICAL  . JOINT REPLACEMENT Right    TKR  . KNEE ARTHROSCOPY    . KNEE SURGERY Right   . SHOULDER ARTHROSCOPY WITH BICEPSTENOTOMY Left 12/14/2016   Procedure: SHOULDER ARTHROSCOPY WITH BICEPSTENOTOMY;  Surgeon: Corky Mull, MD;  Location: [facility] ORS;  Service: Orthopedics;  Laterality: Left;  . SHOULDER ARTHROSCOPY WITH OPEN ROTATOR CUFF REPAIR Left 12/14/2016   Procedure: SHOULDER ARTHROSCOPY WITH OPEN ROTATOR CUFF REPAIR AND ARTHROSCOPIC ROTATOR CUFF REPAIR;  Surgeon: Corky Mull, MD;  Location: [facility] ORS;  Service: Orthopedics;  Laterality: Left;  . SHOULDER ARTHROSCOPY WITH SUBACROMIAL DECOMPRESSION Left 12/14/2016   Procedure: SHOULDER ARTHROSCOPY WITH SUBACROMIAL DECOMPRESSION;  Surgeon: Corky Mull, MD;  Location: [facility] ORS;  Service: Orthopedics;  Laterality: Left;  . TUBAL LIGATION      Prior to Admission medications   Medication Sig Start Date End Date Taking? Authorizing Provider  albuterol (PROVENTIL HFA;VENTOLIN HFA) 108 (90 Base) MCG/ACT inhaler Inhale 2 puffs into the lungs every 6 (six)  hours as needed for wheezing or shortness of breath.    [provider]  aspirin EC 81 MG tablet Take 81 mg by mouth daily.  01/14/16   [provider]  atorvastatin (LIPITOR) 40 MG tablet Take 1 tablet (40 mg total) by mouth daily. Patient not taking: Reported on 12/01/2016 12/07/15 12/06/16  Johnn Hai, PA-C  ciprofloxacin (CIPRO) 500 MG tablet Take 1 tablet (500 mg  total) by mouth 2 (two) times daily. 03/29/17 04/08/17  Gregor Hams, MD  gabapentin (NEURONTIN) 300 MG capsule Take 300 mg by mouth 3 (three) times daily as needed (for neuropathy pain.).     [provider]  ibuprofen (ADVIL,MOTRIN) 800 MG tablet Take 1 tablet (800 mg total) by mouth every 8 (eight) hours as needed. Patient not taking: Reported on 12/01/2016 07/26/16   Beers, Pierce Crane, PA-C  insulin glargine (LANTUS) 100 UNIT/ML injection Inject 100 Units into the skin at bedtime.     [provider]  insulin lispro (HUMALOG) 100 UNIT/ML injection Inject 20 Units into the skin 3 (three) times daily with meals.  01/14/16 01/13/17  [provider]  levonorgestrel (MIRENA) 20 MCG/24HR IUD 1 each by Intrauterine route once.    [provider]  lisinopril (PRINIVIL,ZESTRIL) 40 MG tablet Take 40 mg by mouth every evening.  01/14/16 01/13/17  [provider]  metFORMIN (GLUCOPHAGE) 1000 MG tablet Take 1,000 mg by mouth 2 (two) times daily.     [provider]  metroNIDAZOLE (FLAGYL) 500 MG tablet Take 1 tablet (500 mg total) by mouth 2 (two) times daily. 03/29/17 04/08/17  Gregor Hams, MD  naproxen sodium (ANAPROX) 275 MG tablet Take 1 tablet (275 mg total) by mouth 2 (two) times daily with a meal. 02/12/17 02/12/18  Schuyler Amor, MD  oxyCODONE (ROXICODONE) 5 MG immediate release tablet Take 1-2 tablets (5-10 mg total) by mouth every 4 (four) hours as needed for severe pain. 12/14/16   Poggi, Marshall Cork, MD  oxyCODONE-acetaminophen (ROXICET) 5-325 MG tablet Take 1 tablet by mouth every 4 (four) hours as needed for severe pain. 03/29/17   Gregor Hams, MD  zolpidem (AMBIEN) 5 MG tablet Take 1 tablet (5 mg total) by mouth at bedtime as needed for sleep. 12/14/16   Poggi, Marshall Cork, MD    Allergies No known drug allergies No family history on file.  Social History Social History  Substance Use Topics  . Smoking status: Current Every Day Smoker     Packs/day: 0.50    Years: 25.00    Types: Cigarettes  . Smokeless tobacco: Never Used  . Alcohol use No    Review of Systems Constitutional: No fever/chills Eyes: No visual changes. ENT: No sore throat. Cardiovascular: Denies chest pain. Respiratory: Denies shortness of breath. Gastrointestinal:Positive for abdominal pain and nausea Genitourinary: Negative for dysuria. Musculoskeletal: Negative for back pain. Integumentary: Negative for rash. Neurological: Negative for headaches, focal weakness or numbness.  ____________________________________________   PHYSICAL EXAM:  VITAL SIGNS: ED Triage Vitals  Enc Vitals Group     BP 03/28/17 2012 (!) 148/89     Pulse Rate 03/28/17 2012 100     Resp 03/28/17 2012 18     Temp 03/28/17 2012 99.2 F (37.3 C)     Temp Source 03/28/17 2012 Oral     SpO2 03/28/17 2012 98 %     Weight 03/28/17 2012 285 lb (129.3 kg)     Height 03/28/17 2012 5\' 5"  (1.651 m)  Head Circumference --      Peak Flow --      Pain Score 03/28/17 2018 10     Pain Loc --      Pain Edu? --      Excl. in [place]? --     Constitutional: Alert and oriented. Well appearing and in no acute distress. Eyes: Conjunctivae are normal. PERRL. EOMI. Head: Atraumatic. Mouth/Throat: Mucous membranes are moist. Oropharynx non-erythematous. Neck: No stridor.   Cardiovascular: Normal rate, regular rhythm. Good peripheral circulation. Grossly normal heart sounds. Respiratory: Normal respiratory effort.  No retractions. Lungs CTAB. Gastrointestinal:Right upper quadrant/left upper quadrant tenderness to palpation. No distention.  Musculoskeletal: No lower extremity tenderness nor edema. No gross deformities of extremities. Neurologic:  Normal speech and language. No gross focal neurologic deficits are appreciated.  Skin:  Skin is warm, dry and intact. No rash noted. Psychiatric: Mood and affect are normal. Speech and behavior are  normal.  ____________________________________________   LABS (all labs ordered are listed, but only abnormal results are displayed)  Labs Reviewed  COMPREHENSIVE METABOLIC PANEL - Abnormal; Notable for the following:       Result Value   Glucose, Bld 198 (*)    Total Protein 8.3 (*)    All other components within normal limits  CBC - Abnormal; Notable for the following:    RBC 5.29 (*)    Hemoglobin 16.1 (*)    HCT 49.0 (*)    RDW 14.9 (*)    All other components within normal limits  URINALYSIS, COMPLETE (UACMP) WITH MICROSCOPIC - Abnormal; Notable for the following:    Color, Urine YELLOW (*)    APPearance HAZY (*)    Hgb urine dipstick MODERATE (*)    Leukocytes, UA TRACE (*)    Squamous Epithelial / LPF 0-5 (*)    All other components within normal limits  LIPASE, BLOOD  POCT PREGNANCY, URINE   ____________________________________________  EKG  ED ECG REPORT I, [place] Ol[place]emi Panchal, the attending physician, personally viewed and interpreted this ECG.   Date: 03/29/2017  EKG Time: 12:16 AM  Rate: 94  Rhythm: Normal sinus rhythm  Axis: Normal  Intervals: Normal  ST&T Change: None  ____________________________________________  RADIOLOGY I,  N Riana Tessmer, personally viewed and evaluated these images (plain radiographs) as part of my medical decision making, as well as reviewing the written report by the radiologist.  Ct Abdomen Pelvis W Contrast  Result Date: 03/29/2017 CLINICAL DATA:  Acute onset of right upper quadrant and right lower quadrant abdominal pain. Vomiting. Initial encounter. EXAM: CT ABDOMEN AND PELVIS WITH CONTRAST TECHNIQUE: Multidetector CT imaging of the abdomen and pelvis was performed using the standard protocol following bolus administration of intravenous contrast. CONTRAST:  118mL ISOVUE-300 IOPAMIDOL (ISOVUE-300) INJECTION 61% COMPARISON:  CT of the abdomen and pelvis performed 07/09/2013, and right upper quadrant ultrasound performed earlier  today at 1:00 a.m. FINDINGS: Lower chest: Minimal left basilar atelectasis is noted. The visualized portions of the mediastinum are unremarkable. Hepatobiliary: There is diffuse fatty inflation within the liver, with mild sparing about the gallbladder fossa. The gallbladder is unremarkable in appearance. The common bile duct remains normal in caliber. 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 The pancreas is otherwise unremarkable in appearance. Spleen: The spleen is unremarkable in appearance. Adrenals/Urinary Tract: The adrenal glands are unremarkable in appearance. The kidneys are within normal limits. There is no evidence of hydronephrosis. No renal or ureteral stones are identified. No perinephric stranding is  seen. Stomach/Bowel: The stomach is unremarkable in appearance. The small bowel is within normal limits. The appendix is normal in caliber, without evidence of appendicitis. Mild diverticulosis is noted along the descending and proximal sigmoid colon. There appears to be focal wall thickening at the mid sigmoid colon, with a few nearby lymph nodes measuring up to 6 mm in short axis. Sigmoidoscopy is recommended to exclude underlying malignancy. Vascular/Lymphatic: The abdominal aorta is unremarkable in appearance. The inferior vena cava is grossly unremarkable. No retroperitoneal lymphadenopathy is seen. No pelvic sidewall lymphadenopathy is identified. Reproductive: The bladder is mildly distended and within normal limits. A likely small left uterine fibroid is noted. The patient's intrauterine device is noted in expected position at the fundus of the uterus. The ovaries are relatively symmetric. No suspicious adnexal masses are seen. Other: No additional soft tissue abnormalities are seen. Musculoskeletal: No acute osseous abnormalities are identified. The visualized musculature is unremarkable in appearance. IMPRESSION: 1. Question of focal wall thickening at  the mid sigmoid colon, with a few nearby mildly prominent lymph nodes measuring up to 6 mm in short axis. Sigmoidoscopy is recommended to exclude underlying malignancy, when and as deemed clinically appropriate. 2. Mild diverticulosis along the descending and proximal sigmoid colon. Given question of soft tissue inflammation at the mid sigmoid colon, mild recurrent acute diverticulitis could have a similar appearance. Would correlate with the patient's symptoms. 3. Diffuse fatty infiltration within the liver. 4. 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 Electronically Signed   By: Garald Balding M.D.   On: 03/29/2017 02:21   US Abdomen Limited Ruq  Result Date: 03/29/2017 CLINICAL DATA:  Acute onset of right upper quadrant abdominal pain and vomiting. Initial encounter. EXAM: US ABDOMEN LIMITED - RIGHT UPPER QUADRANT COMPARISON:  Abdominal ultrasound performed 12/02/2005, and CT of the abdomen and pelvis performed 07/09/2013 FINDINGS: Gallbladder: No gallstones or wall thickening visualized. No sonographic Murphy sign noted by sonographer. Common bile duct: Diameter: 0.4 cm, within normal limits in caliber. Liver: No focal lesion identified. Diffusely increased parenchymal echogenicity and coarsened echotexture likely reflects fatty infiltration. IMPRESSION: 1. No acute abnormality at the right upper quadrant. 2. Diffuse fatty infiltration within the liver. Electronically Signed   By: Garald Balding M.D.   On: 03/29/2017 01:32       Procedures   ____________________________________________   INITIAL IMPRESSION / ASSESSMENT AND PLAN / ED COURSE  Pertinent labs & imaging results that were available during my care of the patient were reviewed by me and considered in my medical decision making (see chart for details).  Patient given IV morphine and Zofran in the emergency department. CT scan revealed diverticulitis as such patient given Cipro and Flagyl. Patient will be  referred to Dr. Allen Norris gastroenterologist to further outpatient evaluation.      ____________________________________________  FINAL CLINICAL IMPRESSION(S) / ED DIAGNOSES  Final diagnoses:  Vomiting  RUQ pain  Acute diverticulitis     MEDICATIONS GIVEN DURING THIS VISIT:  Medications  ciprofloxacin (CIPRO) tablet 500 mg (not administered)  metroNIDAZOLE (FLAGYL) tablet 500 mg (not administered)  morphine 4 MG/ML injection 4 mg (4 mg Intravenous Given 03/29/17 0038)  ondansetron (ZOFRAN) injection 4 mg (4 mg Intravenous Given 03/29/17 0038)  iopamidol (ISOVUE-300) 61 % injection 100 mL (100 mLs Intravenous Contrast Given 03/29/17 0204)     NEW OUTPATIENT MEDICATIONS STARTED DURING THIS VISIT:  New Prescriptions   CIPROFLOXACIN (CIPRO) 500 MG TABLET    Take 1 tablet (500 mg total) by  mouth 2 (two) times daily.   METRONIDAZOLE (FLAGYL) 500 MG TABLET    Take 1 tablet (500 mg total) by mouth 2 (two) times daily.   OXYCODONE-ACETAMINOPHEN (ROXICET) 5-325 MG TABLET    Take 1 tablet by mouth every 4 (four) hours as needed for severe pain.    Modified Medications   No medications on file    Discontinued Medications   No medications on file     Note:  This document was prepared using Dragon voice recognition software and may include unintentional dictation errors.    Gregor Hams, MD 03/29/17 [phone]

## 2017-03-28 NOTE — ED Triage Notes (Signed)
Patient states "I thought I had gas, but then it felt like a bubble sitting in my stomach and I started throwing up like 30 mins before I got here".

## 2017-03-29 ENCOUNTER — Encounter: Payer: Self-pay | Admitting: Radiology

## 2017-03-29 ENCOUNTER — Emergency Department: Payer: Medicaid Other

## 2017-03-29 MED ORDER — CIPROFLOXACIN HCL 500 MG PO TABS: 500.0000 mg | ORAL_TABLET | Freq: Two times a day (BID) | ORAL | 0 refills | Status: AC

## 2017-03-29 MED ORDER — METRONIDAZOLE 500 MG PO TABS
500.0000 mg | ORAL_TABLET | Freq: Once | ORAL | Status: AC
  Administered 2017-03-29: 500 mg via ORAL
  Filled 2017-03-29: qty 1

## 2017-03-29 MED ORDER — CIPROFLOXACIN HCL 500 MG PO TABS
500.0000 mg | ORAL_TABLET | Freq: Once | ORAL | Status: AC
  Administered 2017-03-29: 500 mg via ORAL
  Filled 2017-03-29: qty 1

## 2017-03-29 MED ORDER — OXYCODONE-ACETAMINOPHEN 5-325 MG PO TABS: 1.0000 | ORAL_TABLET | ORAL | 0 refills | Status: DC | PRN

## 2017-03-29 MED ORDER — METRONIDAZOLE 500 MG PO TABS: 500.0000 mg | ORAL_TABLET | Freq: Two times a day (BID) | ORAL | 0 refills | Status: AC

## 2017-03-29 MED ORDER — IOPAMIDOL (ISOVUE-300) INJECTION 61%
100.0000 mL | Freq: Once | INTRAVENOUS | Status: AC | PRN
  Administered 2017-03-29: 100 mL via INTRAVENOUS

## 2017-03-29 NOTE — ED Notes (Signed)
Patient transported to Ultrasound 

## 2017-04-12 ENCOUNTER — Ambulatory Visit
Admission: RE | Admit: 2017-04-12 | Discharge: 2017-04-12 | Disposition: A | Discharge status: Home / Self Care | Payer: Medicaid Other | Source: Ambulatory Visit | Attending: Student | Admitting: Student

## 2017-04-12 DIAGNOSIS — M7552 Bursitis of left shoulder: Secondary | ICD-10-CM | POA: Diagnosis not present

## 2017-04-12 DIAGNOSIS — M75122 Complete rotator cuff tear or rupture of left shoulder, not specified as traumatic: Secondary | ICD-10-CM

## 2017-04-12 DIAGNOSIS — M7582 Other shoulder lesions, left shoulder: Secondary | ICD-10-CM | POA: Diagnosis not present

## 2017-04-12 DIAGNOSIS — Z9889 Other specified postprocedural states: Secondary | ICD-10-CM | POA: Diagnosis not present

## 2017-04-26 ENCOUNTER — Other Ambulatory Visit: Payer: Self-pay | Admitting: Surgery

## 2017-04-26 DIAGNOSIS — M4722 Other spondylosis with radiculopathy, cervical region: Secondary | ICD-10-CM

## 2017-05-10 ENCOUNTER — Ambulatory Visit
Admission: RE | Admit: 2017-05-10 | Discharge: 2017-05-10 | Disposition: A | Discharge status: Home / Self Care | Payer: Medicaid Other | Source: Ambulatory Visit | Attending: Surgery | Admitting: Surgery

## 2017-05-10 DIAGNOSIS — M50221 Other cervical disc displacement at C4-C5 level: Secondary | ICD-10-CM | POA: Diagnosis not present

## 2017-05-10 DIAGNOSIS — M4722 Other spondylosis with radiculopathy, cervical region: Secondary | ICD-10-CM | POA: Diagnosis present

## 2017-05-10 DIAGNOSIS — M4802 Spinal stenosis, cervical region: Secondary | ICD-10-CM | POA: Diagnosis not present

## 2017-05-23 IMAGING — CR DG SHOULDER 2+V*L*
3 series · 3 of 3 positions shown · non-contrast
Comparison: None.

CLINICAL DATA: Injured left shoulder today with pain

EXAM:
LEFT SHOULDER - 2+ VIEW

[shoulder grashey]
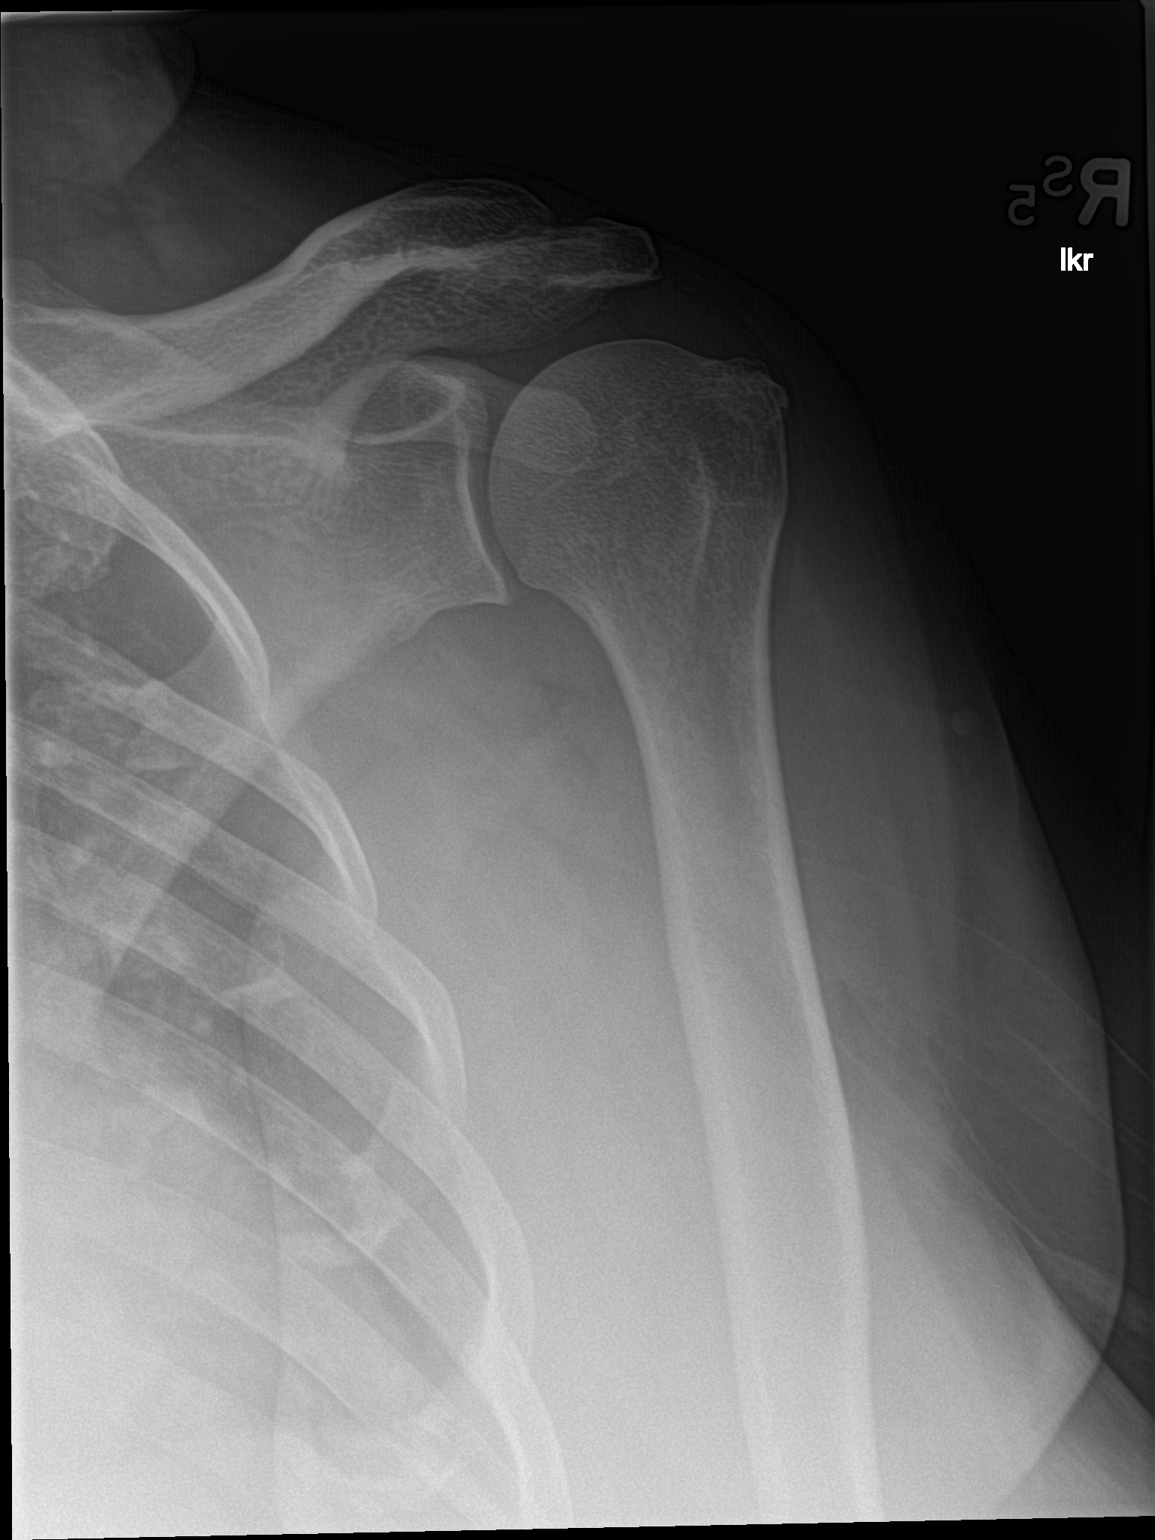

[shoulder y view]
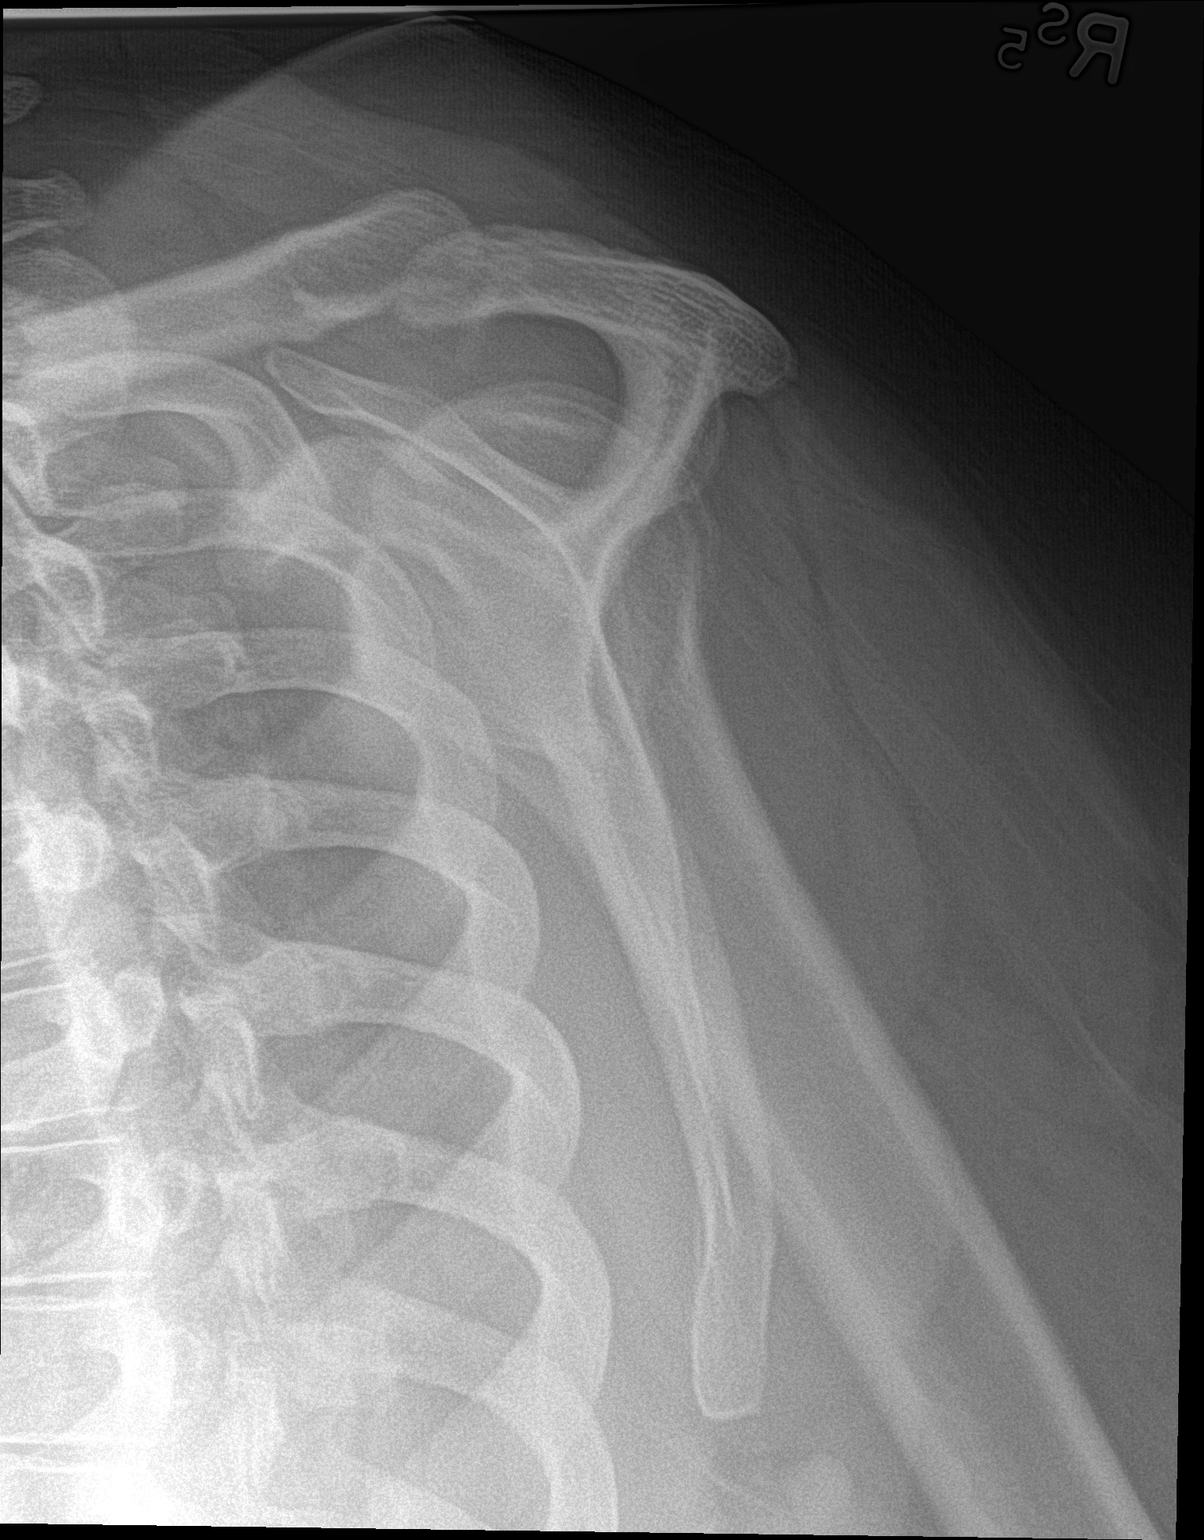

[shoulder axillary]
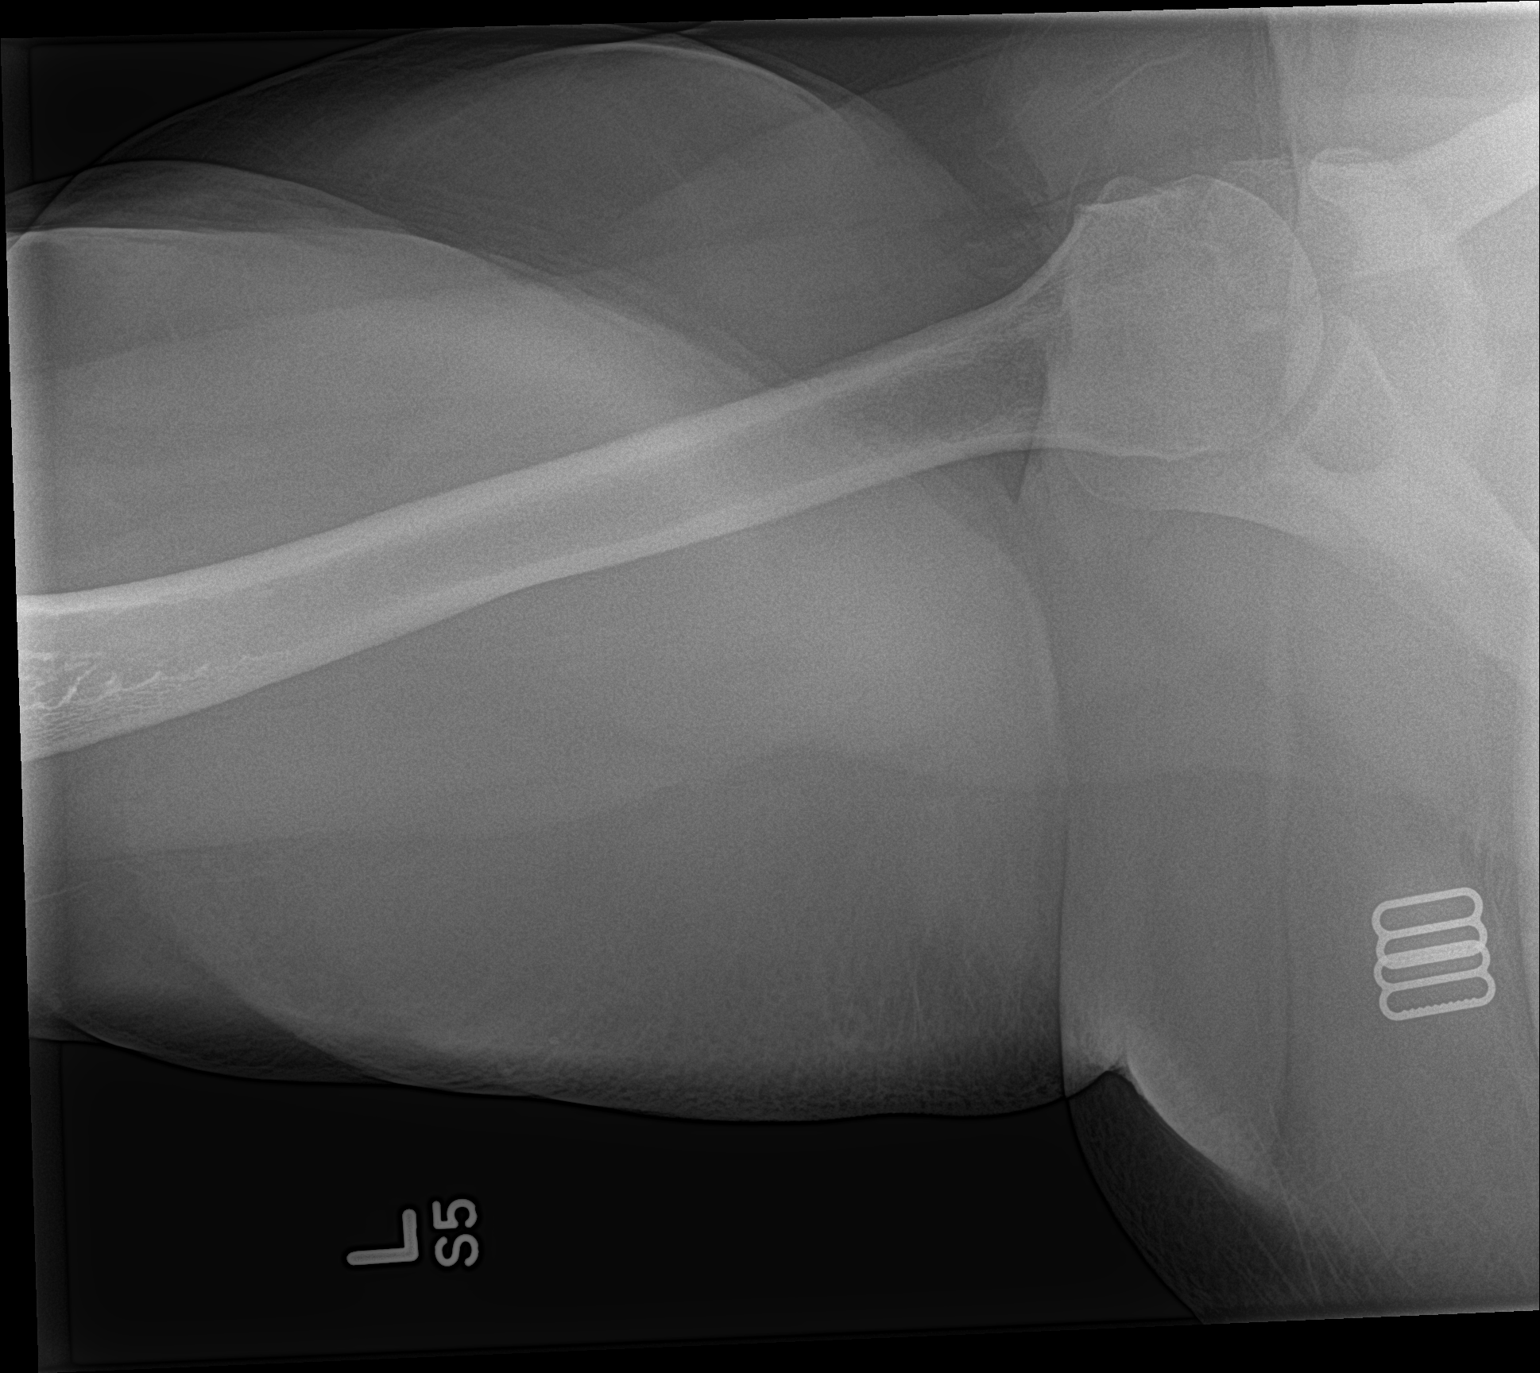

[3 of 3 positions shown; findings below may reference images not displayed]

FINDINGS: The left humeral head is in normal position and the glenohumeral
joint space is unremarkable. The left AC joint is normally aligned.
No acute abnormality is seen.
IMPRESSION: Negative.

## 2017-05-29 ENCOUNTER — Encounter: Payer: Self-pay | Admitting: Emergency Medicine

## 2017-05-29 ENCOUNTER — Emergency Department
Admission: EM | Admit: 2017-05-29 | Discharge: 2017-05-29 | Disposition: A | Discharge status: Home / Self Care | Payer: Medicaid Other | Attending: Emergency Medicine | Admitting: Emergency Medicine

## 2017-05-29 DIAGNOSIS — M25512 Pain in left shoulder: Secondary | ICD-10-CM | POA: Diagnosis present

## 2017-05-29 DIAGNOSIS — M5136 Other intervertebral disc degeneration, lumbar region: Secondary | ICD-10-CM | POA: Insufficient documentation

## 2017-05-29 DIAGNOSIS — Z794 Long term (current) use of insulin: Secondary | ICD-10-CM | POA: Insufficient documentation

## 2017-05-29 DIAGNOSIS — I10 Essential (primary) hypertension: Secondary | ICD-10-CM | POA: Insufficient documentation

## 2017-05-29 DIAGNOSIS — Z7982 Long term (current) use of aspirin: Secondary | ICD-10-CM | POA: Diagnosis not present

## 2017-05-29 DIAGNOSIS — M75102 Unspecified rotator cuff tear or rupture of left shoulder, not specified as traumatic: Secondary | ICD-10-CM | POA: Insufficient documentation

## 2017-05-29 DIAGNOSIS — F17210 Nicotine dependence, cigarettes, uncomplicated: Secondary | ICD-10-CM | POA: Diagnosis not present

## 2017-05-29 DIAGNOSIS — J45909 Unspecified asthma, uncomplicated: Secondary | ICD-10-CM | POA: Insufficient documentation

## 2017-05-29 DIAGNOSIS — M5030 Other cervical disc degeneration, unspecified cervical region: Secondary | ICD-10-CM

## 2017-05-29 DIAGNOSIS — M7582 Other shoulder lesions, left shoulder: Secondary | ICD-10-CM

## 2017-05-29 DIAGNOSIS — Z79899 Other long term (current) drug therapy: Secondary | ICD-10-CM | POA: Diagnosis not present

## 2017-05-29 DIAGNOSIS — E1140 Type 2 diabetes mellitus with diabetic neuropathy, unspecified: Secondary | ICD-10-CM | POA: Insufficient documentation

## 2017-05-29 DIAGNOSIS — M5412 Radiculopathy, cervical region: Secondary | ICD-10-CM | POA: Insufficient documentation

## 2017-05-29 MED ORDER — DICLOFENAC SODIUM 75 MG PO TBEC: 75.0000 mg | DELAYED_RELEASE_TABLET | Freq: Two times a day (BID) | ORAL | 1 refills | Status: DC

## 2017-05-29 MED ORDER — ORPHENADRINE CITRATE 30 MG/ML IJ SOLN
60.0000 mg | INTRAMUSCULAR | Status: AC
  Administered 2017-05-29: 60 mg via INTRAMUSCULAR
  Filled 2017-05-29: qty 2

## 2017-05-29 MED ORDER — KETOROLAC TROMETHAMINE 30 MG/ML IJ SOLN
60.0000 mg | Freq: Once | INTRAMUSCULAR | Status: AC
  Administered 2017-05-29: 60 mg via INTRAMUSCULAR
  Filled 2017-05-29: qty 2

## 2017-05-29 MED ORDER — ORPHENADRINE CITRATE ER 100 MG PO TB12: 100.0000 mg | ORAL_TABLET | Freq: Two times a day (BID) | ORAL | 0 refills | Status: DC | PRN

## 2017-05-29 NOTE — Discharge Instructions (Signed)
You have some muscle strain and spasm caused by your neck degenerative disc disease and your rotator cuff. Take the prescription anti-inflammatory as directed. Take the muscle relaxant as needed. Follow-up with Dr. Sharlet Salina as scheduled or Dr. Roland Rack as needed.

## 2017-05-29 NOTE — ED Provider Notes (Signed)
[facility] Emergency Department Provider Note ____________________________________________  Time seen: 1346  I have reviewed the triage vital signs and the nursing notes.  HISTORY  Chief Complaint  Shoulder Pain  HPI Brenda Hicks is a 51 y.o. female presents to the ED for evaluation of chronic intermittent left shoulder pain as well as some neck pain and spasm. Patient has a history of left rotator cuff tear with a repair in January. She continued to have shoulder pain and apparently was reevaluated by MRI in May, which confirmed a 3 tear over the rotator cuff. She also has a history of some cervical degenerative disc disease, and is scheduled to have steroid injections next month with Dr. Sharlet Salina. Patient presents today due to increased neck pain and spasm upon awakening. She denies any recent injury, accident, trauma, fall. She denies any distal paresthesias or weakness   above her baseline. She was recently started on Celebrex by her primary care provider, but denies any benefit or pain relief from that medication.  Past Medical History:  Diagnosis Date  . Arthritis   . Asthma    WELL CONTROLLED  . Cancer of ear   . Diabetes mellitus without complication ([place])   . Hypertension   . Sleep apnea    USES CPAP    Patient Active Problem List   Diagnosis Date Noted  . Sebaceous cyst 06/01/2016  . Obstructive apnea 05/31/2016  . HLD (hyperlipidemia) 05/31/2016  . Acid reflux 05/31/2016  . Essential (primary) hypertension 05/31/2016  . Abnormal Pap smear of cervix 05/31/2016  . Arthritis of knee, degenerative 07/25/2015  . History of artificial joint 07/25/2015  . Gonalgia 02/17/2015  . Type 2 diabetes mellitus ([place]) 10/23/2014  . Otalgia of left ear 09/24/2014  . 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 Diabetic peripheral neuropathy associated with type 2 diabetes  mellitus ([place]) 05/16/2014  . Endometrial polyp 11/01/2013  . Fibroids, intramural 10/09/2013  . Pain due to knee joint prosthesis ([place]) 10/05/2013  . Body mass index (BMI) of 50-59.9 in adult ([place]) 10/03/2013  . Abnormal uterine bleeding 10/03/2013  . Adiposity 10/01/2013  . Excessive and frequent menstruation with irregular cycle 10/01/2013  . Adaptive colitis 10/01/2013  . History of migraine headaches 10/01/2013  . H/O malignant neoplasm of skin 10/01/2013  . Fatty liver disease, nonalcoholic 79/12/4095  . Diverticulitis 10/01/2013  . Current tobacco use 08/29/2013  . H/O total knee replacement 08/29/2013  . Cannot sleep 08/29/2013  . Dysmenorrhea 08/29/2013  . Airway hyperreactivity 08/29/2013  . Absolute anemia 08/29/2013  . Tobacco use 08/29/2013    Past Surgical History:  Procedure Laterality Date  . DILATION AND CURETTAGE OF UTERUS    . ENDOMETRIAL BIOPSY    . EXPLORATORY LAPAROTOMY     REMOVAL OF RUPTURED ECTOPIC  . HAND SURGERY    . HERNIA REPAIR     UMBILICAL  . JOINT REPLACEMENT Right    TKR  . KNEE ARTHROSCOPY    . KNEE SURGERY Right   . SHOULDER ARTHROSCOPY WITH BICEPSTENOTOMY Left 12/14/2016   Procedure: SHOULDER ARTHROSCOPY WITH BICEPSTENOTOMY;  Surgeon: Corky Mull, MD;  Location: [facility] ORS;  Service: Orthopedics;  Laterality: Left;  . SHOULDER ARTHROSCOPY WITH OPEN ROTATOR CUFF REPAIR Left 12/14/2016   Procedure: SHOULDER ARTHROSCOPY WITH OPEN ROTATOR CUFF REPAIR AND ARTHROSCOPIC ROTATOR CUFF REPAIR;  Surgeon: Corky Mull, MD;  Location: [facility] ORS;  Service: Orthopedics;  Laterality: Left;  .  SHOULDER ARTHROSCOPY WITH SUBACROMIAL DECOMPRESSION Left 12/14/2016   Procedure: SHOULDER ARTHROSCOPY WITH SUBACROMIAL DECOMPRESSION;  Surgeon: Corky Mull, MD;  Location: [facility] ORS;  Service: Orthopedics;  Laterality: Left;  . TUBAL LIGATION      Prior to Admission medications   Medication Sig Start Date End Date Taking? Authorizing Provider  albuterol (PROVENTIL  HFA;VENTOLIN HFA) 108 (90 Base) MCG/ACT inhaler Inhale 2 puffs into the lungs every 6 (six) hours as needed for wheezing or shortness of breath.    [provider]  aspirin EC 81 MG tablet Take 81 mg by mouth daily.  01/14/16   [provider]  atorvastatin (LIPITOR) 40 MG tablet Take 1 tablet (40 mg total) by mouth daily. Patient not taking: Reported on 12/01/2016 12/07/15 12/06/16  Johnn Hai, PA-C  diclofenac (VOLTAREN) 75 MG EC tablet Take 1 tablet (75 mg total) by mouth 2 (two) times daily. 05/29/17   Collin Hendley, Dannielle Karvonen, PA-C  gabapentin (NEURONTIN) 300 MG capsule Take 300 mg by mouth 3 (three) times daily as needed (for neuropathy pain.).     [provider]  ibuprofen (ADVIL,MOTRIN) 800 MG tablet Take 1 tablet (800 mg total) by mouth every 8 (eight) hours as needed. Patient not taking: Reported on 12/01/2016 07/26/16   Beers, Pierce Crane, PA-C  insulin glargine (LANTUS) 100 UNIT/ML injection Inject 100 Units into the skin at bedtime.     [provider]  insulin lispro (HUMALOG) 100 UNIT/ML injection Inject 20 Units into the skin 3 (three) times daily with meals.  01/14/16 01/13/17  [provider]  levonorgestrel (MIRENA) 20 MCG/24HR IUD 1 each by Intrauterine route once.    [provider]  lisinopril (PRINIVIL,ZESTRIL) 40 MG tablet Take 40 mg by mouth every evening.  01/14/16 01/13/17  [provider]  metFORMIN (GLUCOPHAGE) 1000 MG tablet Take 1,000 mg by mouth 2 (two) times daily.     [provider]  naproxen sodium (ANAPROX) 275 MG tablet Take 1 tablet (275 mg total) by mouth 2 (two) times daily with a meal. 02/12/17 02/12/18  Schuyler Amor, MD  orphenadrine (NORFLEX) 100 MG tablet Take 1 tablet (100 mg total) by mouth 2 (two) times daily as needed for muscle spasms. 05/29/17   Icesis Renn, Dannielle Karvonen, PA-C  oxyCODONE (ROXICODONE) 5 MG immediate release tablet Take 1-2 tablets (5-10 mg total) by mouth every 4 (four)  hours as needed for severe pain. 12/14/16   Poggi, Marshall Cork, MD  oxyCODONE-acetaminophen (ROXICET) 5-325 MG tablet Take 1 tablet by mouth every 4 (four) hours as needed for severe pain. 03/29/17   Gregor Hams, MD  zolpidem (AMBIEN) 5 MG tablet Take 1 tablet (5 mg total) by mouth at bedtime as needed for sleep. 12/14/16   Poggi, Marshall Cork, MD    Allergies Patient has no known allergies.  No family history on file.  Social History Social History  Substance Use Topics  . Smoking status: Current Every Day Smoker    Packs/day: 0.50    Years: 25.00    Types: Cigarettes  . Smokeless tobacco: Never Used  . Alcohol use No    Review of Systems  Constitutional: Negative for fever. Cardiovascular: Negative for chest pain. Respiratory: Negative for shortness of breath. Musculoskeletal: positive for left neck, upper back and left shoulder pain. Skin: Negative for rash. Neurological: Negative for headaches, focal weakness or numbness. ____________________________________________  PHYSICAL EXAM:  VITAL SIGNS: ED Triage Vitals  Enc Vitals Group  BP 05/29/17 1311 (!) 151/91     Pulse Rate 05/29/17 1311 100     Resp 05/29/17 1311 18     Temp 05/29/17 1311 98.2 F (36.8 C)     Temp Source 05/29/17 1311 Oral     SpO2 05/29/17 1311 100 %     Weight 05/29/17 1312 270 lb (122.5 kg)     Height 05/29/17 1312 5\' 5"  (1.651 m)     Head Circumference --      Peak Flow --      Pain Score 05/29/17 1311 10     Pain Loc --      Pain Edu? --      Excl. in [place]? --     Constitutional: Alert and oriented. Well appearing and in no distress. Head: Normocephalic and atraumatic. Cardiovascular: Normal rate, regular rhythm. Normal distal pulses. Respiratory: Normal respiratory effort. No wheezes/rales/rhonchi. Musculoskeletal: Patient with decreased neck range of motion with left rotation and left lateral bending secondary to pain. Her neck is otherwise supple without palpable spasm or crepitus. Normal  appearance of the left shoulder without obvious deformity, dislocation, or sulcus sign. Patient with the left shoulder held in abduction secondary to pain response. She is without significant tenderness to palpation over the upper trapezius or scapulothoracic musculature. Nontender with normal range of motion in all extremities.  Neurologic:  Normal gross sensation. Normal composite fist and grip strength bilaterally. Normal speech and language. No gross focal neurologic deficits are appreciated. Skin:  Skin is warm, dry and intact. No rash noted. ____________________________________________   RADIOLOGY  MRI Cervical Spine (05/10/17)  IMPRESSION: 1. C4-5 disc degeneration including a central protrusion with mild spinal stenosis and moderate bilateral neural foraminal stenosis. Mild cord flattening with possible mild myelopathic signal change. 2. Small C3-4 disc protrusion without stenosis. 3. Diffusely diminished bone marrow signal, nonspecific. Common causes include anemia, smoking, and obesity, although marrow infiltrative processes can also give this appearance.  MRI Left Shoulder (04/12/17)  IMPRESSION: 1. Prior rotator cuff repair. Moderate tendinosis of the supraspinatus tendon with a small high-grade partial-thickness bursal surface tear. Moderate tendinosis of the anterior half of the infraspinatus tendon. 2. Severe subacromial/subdeltoid bursitis. ____________________________________________  PROCEDURES  Toradol 60 mg IM Norflex 60 mg IM ____________________________________________  INITIAL IMPRESSION / ASSESSMENT AND PLAN / ED COURSE  Patient was ED evaluation of chronic and ongoing pain to the neck secondary to underlying DDD. She also has a recurrent left rotator cuff tear. Her symptoms are being managed by Dr. Roland Rack and the patient has a referral pending to Dr. Sharlet Salina. She'll be discharged with a prescription for Flexeril and diclofenac to dose as directed. She is  advised to follow with her primary provider and specialisst as scheduled. ____________________________________________  FINAL CLINICAL IMPRESSION(S) / ED DIAGNOSES  Final diagnoses:  Rotator cuff tendinitis, left  DDD (degenerative disc disease), cervical  Cervical radiculopathy      Carmie End, Dannielle Karvonen, PA-C 05/29/17 [place], [place], MD 05/30/17 1207

## 2017-05-29 NOTE — ED Triage Notes (Signed)
Patient presents to the ED with left shoulder pain and neck pain.  Patient reports some numbness into her left hand.  Patient reports she has an appointment to get injections in her neck in August, patient states, "I just can't wait that long."  Patient had rotator cuff surgery in January but was recently told by her doctor that she re-tore her rotator cuff.  Patient has had several MRI's recently due to her pain.  Patient is alert and oriented x 4.

## 2017-06-09 ENCOUNTER — Encounter: Payer: Self-pay | Admitting: Emergency Medicine

## 2017-06-09 ENCOUNTER — Emergency Department: Payer: Medicaid Other

## 2017-06-09 ENCOUNTER — Inpatient Hospital Stay
Admission: EM | Admit: 2017-06-09 | Discharge: 2017-06-15 | DRG: 854 | Disposition: A | Discharge status: Home / Self Care | Payer: Medicaid Other | Attending: Internal Medicine | Admitting: Internal Medicine

## 2017-06-09 DIAGNOSIS — M549 Dorsalgia, unspecified: Secondary | ICD-10-CM

## 2017-06-09 DIAGNOSIS — A419 Sepsis, unspecified organism: Principal | ICD-10-CM

## 2017-06-09 DIAGNOSIS — L03319 Cellulitis of trunk, unspecified: Secondary | ICD-10-CM | POA: Diagnosis present

## 2017-06-09 DIAGNOSIS — E1140 Type 2 diabetes mellitus with diabetic neuropathy, unspecified: Secondary | ICD-10-CM | POA: Diagnosis present

## 2017-06-09 DIAGNOSIS — H908 Mixed conductive and sensorineural hearing loss, unspecified: Secondary | ICD-10-CM | POA: Diagnosis present

## 2017-06-09 DIAGNOSIS — Z6841 Body Mass Index (BMI) 40.0 and over, adult: Secondary | ICD-10-CM

## 2017-06-09 DIAGNOSIS — F17210 Nicotine dependence, cigarettes, uncomplicated: Secondary | ICD-10-CM | POA: Diagnosis present

## 2017-06-09 DIAGNOSIS — I10 Essential (primary) hypertension: Secondary | ICD-10-CM | POA: Diagnosis present

## 2017-06-09 DIAGNOSIS — N19 Unspecified kidney failure: Secondary | ICD-10-CM

## 2017-06-09 DIAGNOSIS — R05 Cough: Secondary | ICD-10-CM

## 2017-06-09 DIAGNOSIS — R059 Cough, unspecified: Secondary | ICD-10-CM

## 2017-06-09 DIAGNOSIS — M1990 Unspecified osteoarthritis, unspecified site: Secondary | ICD-10-CM | POA: Diagnosis present

## 2017-06-09 DIAGNOSIS — L02212 Cutaneous abscess of back [any part, except buttock]: Secondary | ICD-10-CM | POA: Diagnosis present

## 2017-06-09 DIAGNOSIS — T39395A Adverse effect of other nonsteroidal anti-inflammatory drugs [NSAID], initial encounter: Secondary | ICD-10-CM | POA: Diagnosis present

## 2017-06-09 DIAGNOSIS — K219 Gastro-esophageal reflux disease without esophagitis: Secondary | ICD-10-CM | POA: Diagnosis present

## 2017-06-09 DIAGNOSIS — E662 Morbid (severe) obesity with alveolar hypoventilation: Secondary | ICD-10-CM | POA: Diagnosis present

## 2017-06-09 DIAGNOSIS — E785 Hyperlipidemia, unspecified: Secondary | ICD-10-CM | POA: Diagnosis present

## 2017-06-09 DIAGNOSIS — N179 Acute kidney failure, unspecified: Secondary | ICD-10-CM | POA: Diagnosis present

## 2017-06-09 DIAGNOSIS — Z794 Long term (current) use of insulin: Secondary | ICD-10-CM

## 2017-06-09 DIAGNOSIS — T40605A Adverse effect of unspecified narcotics, initial encounter: Secondary | ICD-10-CM | POA: Diagnosis present

## 2017-06-09 DIAGNOSIS — J45909 Unspecified asthma, uncomplicated: Secondary | ICD-10-CM | POA: Diagnosis present

## 2017-06-09 DIAGNOSIS — Z7982 Long term (current) use of aspirin: Secondary | ICD-10-CM | POA: Diagnosis not present

## 2017-06-09 DIAGNOSIS — L02219 Cutaneous abscess of trunk, unspecified: Secondary | ICD-10-CM

## 2017-06-09 DIAGNOSIS — Z96651 Presence of right artificial knee joint: Secondary | ICD-10-CM | POA: Diagnosis present

## 2017-06-09 LAB — URINALYSIS, ROUTINE W REFLEX MICROSCOPIC
Bilirubin Urine: NEGATIVE
Hgb urine dipstick: NEGATIVE
KETONES UR: NEGATIVE mg/dL
Leukocytes, UA: NEGATIVE
Nitrite: NEGATIVE
PH: 5 (ref 5.0–8.0)
Protein, ur: NEGATIVE mg/dL
SPECIFIC GRAVITY, URINE: 1.024 (ref 1.005–1.030)

## 2017-06-09 LAB — GLUCOSE, CAPILLARY
GLUCOSE-CAPILLARY: 202 mg/dL — AB (ref 65–99)
GLUCOSE-CAPILLARY: 260 mg/dL — AB (ref 65–99)
Glucose-Capillary: 280 mg/dL — ABNORMAL HIGH (ref 65–99)

## 2017-06-09 LAB — COMPREHENSIVE METABOLIC PANEL
ALK PHOS: 106 U/L (ref 38–126)
ALT: 21 U/L (ref 14–54)
ANION GAP: 11 (ref 5–15)
AST: 22 U/L (ref 15–41)
Albumin: 3.7 g/dL (ref 3.5–5.0)
BUN: 12 mg/dL (ref 6–20)
CALCIUM: 8.9 mg/dL (ref 8.9–10.3)
CO2: 27 mmol/L (ref 22–32)
Chloride: 97 mmol/L — ABNORMAL LOW (ref 101–111)
Creatinine, Ser: 1 mg/dL (ref 0.44–1.00)
GFR calc non Af Amer: >60 mL/min (ref >60)
Glucose, Bld: 285 mg/dL — ABNORMAL HIGH (ref 65–99)
Potassium: 4 mmol/L (ref 3.5–5.1)
SODIUM: 135 mmol/L (ref 135–145)
Total Bilirubin: 0.7 mg/dL (ref 0.3–1.2)
Total Protein: 8 g/dL (ref 6.5–8.1)

## 2017-06-09 LAB — LACTIC ACID, PLASMA
LACTIC ACID, VENOUS: 1.7 mmol/L (ref 0.5–1.9)
Lactic Acid, Venous: 1.8 mmol/L (ref 0.5–1.9)

## 2017-06-09 LAB — CBC
HCT: 41.9 % (ref 35.0–47.0)
HEMOGLOBIN: 13.7 g/dL (ref 12.0–16.0)
MCH: 29.8 pg (ref 26.0–34.0)
MCHC: 32.8 g/dL (ref 32.0–36.0)
MCV: 91 fL (ref 80.0–100.0)
PLATELETS: 274 10*3/uL (ref 150–440)
RBC: 4.61 MIL/uL (ref 3.80–5.20)
RDW: 14.9 % — ABNORMAL HIGH (ref 11.5–14.5)
WBC: 13 10*3/uL — ABNORMAL HIGH (ref 3.6–11.0)

## 2017-06-09 MED ORDER — INSULIN ASPART 100 UNIT/ML ~~LOC~~ SOLN
0.0000 [IU] | Freq: Three times a day (TID) | SUBCUTANEOUS | Status: DC
  Administered 2017-06-09 – 2017-06-10 (×2): 3 [IU] via SUBCUTANEOUS
  Administered 2017-06-10: 2 [IU] via SUBCUTANEOUS
  Filled 2017-06-09 (×3): qty 1

## 2017-06-09 MED ORDER — ORPHENADRINE CITRATE ER 100 MG PO TB12
100.0000 mg | ORAL_TABLET | Freq: Two times a day (BID) | ORAL | Status: DC | PRN
  Filled 2017-06-09: qty 1

## 2017-06-09 MED ORDER — INSULIN GLARGINE 100 UNIT/ML ~~LOC~~ SOLN: 100.0000 [IU] | Freq: Every day | SUBCUTANEOUS | Status: DC

## 2017-06-09 MED ORDER — ATORVASTATIN CALCIUM 20 MG PO TABS
40.0000 mg | ORAL_TABLET | Freq: Every day | ORAL | Status: DC
  Administered 2017-06-09 – 2017-06-15 (×7): 40 mg via ORAL
  Filled 2017-06-09 (×7): qty 2

## 2017-06-09 MED ORDER — INSULIN GLARGINE 100 UNIT/ML ~~LOC~~ SOLN
50.0000 [IU] | Freq: Every day | SUBCUTANEOUS | Status: DC
  Administered 2017-06-09: 22:00:00 50 [IU] via SUBCUTANEOUS
  Filled 2017-06-09 (×3): qty 0.5

## 2017-06-09 MED ORDER — ONDANSETRON HCL 4 MG PO TABS
4.0000 mg | ORAL_TABLET | Freq: Four times a day (QID) | ORAL | Status: DC | PRN
  Administered 2017-06-14: 4 mg via ORAL
  Filled 2017-06-09: qty 1

## 2017-06-09 MED ORDER — NAPROXEN 250 MG PO TABS
250.0000 mg | ORAL_TABLET | Freq: Two times a day (BID) | ORAL | Status: DC
  Administered 2017-06-09: 250 mg via ORAL
  Filled 2017-06-09 (×2): qty 1

## 2017-06-09 MED ORDER — ALBUTEROL SULFATE (2.5 MG/3ML) 0.083% IN NEBU: 2.5000 mg | INHALATION_SOLUTION | Freq: Four times a day (QID) | RESPIRATORY_TRACT | Status: DC | PRN

## 2017-06-09 MED ORDER — LISINOPRIL 10 MG PO TABS
40.0000 mg | ORAL_TABLET | Freq: Every evening | ORAL | Status: DC
  Administered 2017-06-09: 19:00:00 40 mg via ORAL
  Filled 2017-06-09: qty 4

## 2017-06-09 MED ORDER — SODIUM CHLORIDE 0.9 % IV BOLUS (SEPSIS)
500.0000 mL | Freq: Once | INTRAVENOUS | Status: AC
Start: 2017-06-09 — End: 2017-06-09
  Administered 2017-06-09: 500 mL via INTRAVENOUS

## 2017-06-09 MED ORDER — VANCOMYCIN HCL IN DEXTROSE 1-5 GM/200ML-% IV SOLN
1000.0000 mg | Freq: Once | INTRAVENOUS | Status: AC
  Administered 2017-06-09: 1000 mg via INTRAVENOUS
  Filled 2017-06-09: qty 200

## 2017-06-09 MED ORDER — OXYCODONE HCL 5 MG PO TABS
5.0000 mg | ORAL_TABLET | ORAL | Status: DC | PRN
  Administered 2017-06-09: 21:00:00 10 mg via ORAL
  Administered 2017-06-09: 12:00:00 5 mg via ORAL
  Administered 2017-06-10 – 2017-06-15 (×8): 10 mg via ORAL
  Filled 2017-06-09 (×9): qty 2
  Filled 2017-06-09: qty 1

## 2017-06-09 MED ORDER — METFORMIN HCL 500 MG PO TABS
1000.0000 mg | ORAL_TABLET | Freq: Two times a day (BID) | ORAL | Status: DC
  Administered 2017-06-09 (×2): 1000 mg via ORAL
  Filled 2017-06-09 (×5): qty 2

## 2017-06-09 MED ORDER — PIPERACILLIN-TAZOBACTAM 3.375 G IVPB
3.3750 g | Freq: Three times a day (TID) | INTRAVENOUS | Status: DC
  Administered 2017-06-09: 19:00:00 3.375 g via INTRAVENOUS
  Filled 2017-06-09 (×2): qty 50

## 2017-06-09 MED ORDER — ONDANSETRON HCL 4 MG/2ML IJ SOLN
4.0000 mg | Freq: Four times a day (QID) | INTRAMUSCULAR | Status: DC | PRN
  Administered 2017-06-10 – 2017-06-14 (×5): 4 mg via INTRAVENOUS
  Filled 2017-06-09 (×5): qty 2

## 2017-06-09 MED ORDER — GABAPENTIN 300 MG PO CAPS
300.0000 mg | ORAL_CAPSULE | Freq: Three times a day (TID) | ORAL | Status: DC
  Administered 2017-06-09 – 2017-06-11 (×7): 300 mg via ORAL
  Filled 2017-06-09 (×7): qty 1

## 2017-06-09 MED ORDER — ACETAMINOPHEN 325 MG PO TABS
650.0000 mg | ORAL_TABLET | Freq: Four times a day (QID) | ORAL | Status: DC | PRN
  Administered 2017-06-10 – 2017-06-11 (×2): 650 mg via ORAL
  Filled 2017-06-09 (×2): qty 2

## 2017-06-09 MED ORDER — VANCOMYCIN HCL IN DEXTROSE 1-5 GM/200ML-% IV SOLN
1000.0000 mg | Freq: Three times a day (TID) | INTRAVENOUS | Status: DC
  Administered 2017-06-09 – 2017-06-10 (×2): 1000 mg via INTRAVENOUS
  Filled 2017-06-09 (×5): qty 200

## 2017-06-09 MED ORDER — ACETAMINOPHEN 650 MG RE SUPP: 650.0000 mg | Freq: Four times a day (QID) | RECTAL | Status: DC | PRN

## 2017-06-09 MED ORDER — LEVONORGESTREL 20 MCG/24HR IU IUD: 1.0000 | INTRAUTERINE_SYSTEM | Freq: Once | INTRAUTERINE | Status: DC

## 2017-06-09 MED ORDER — ASPIRIN EC 81 MG PO TBEC
81.0000 mg | DELAYED_RELEASE_TABLET | Freq: Every day | ORAL | Status: DC
Start: 2017-06-09 — End: 2017-06-15
  Administered 2017-06-09 – 2017-06-15 (×7): 81 mg via ORAL
  Filled 2017-06-09 (×7): qty 1

## 2017-06-09 MED ORDER — PIPERACILLIN-TAZOBACTAM 3.375 G IVPB 30 MIN
3.3750 g | Freq: Once | INTRAVENOUS | Status: AC
  Administered 2017-06-09: 3.375 g via INTRAVENOUS
  Filled 2017-06-09: qty 50

## 2017-06-09 MED ORDER — INSULIN ASPART 100 UNIT/ML ~~LOC~~ SOLN
0.0000 [IU] | Freq: Three times a day (TID) | SUBCUTANEOUS | Status: DC
  Administered 2017-06-09: 12:00:00 5 [IU] via SUBCUTANEOUS
  Filled 2017-06-09: qty 1

## 2017-06-09 MED ORDER — SODIUM CHLORIDE 0.9 % IV SOLN
INTRAVENOUS | Status: DC
  Administered 2017-06-09: 12:00:00 via INTRAVENOUS

## 2017-06-09 MED ORDER — INSULIN ASPART 100 UNIT/ML ~~LOC~~ SOLN
0.0000 [IU] | Freq: Every day | SUBCUTANEOUS | Status: DC
  Administered 2017-06-09: 21:00:00 3 [IU] via SUBCUTANEOUS
  Filled 2017-06-09: qty 1

## 2017-06-09 MED ORDER — DOCUSATE SODIUM 100 MG PO CAPS
100.0000 mg | ORAL_CAPSULE | Freq: Two times a day (BID) | ORAL | Status: DC
  Administered 2017-06-09 – 2017-06-15 (×8): 100 mg via ORAL
  Filled 2017-06-09 (×11): qty 1

## 2017-06-09 MED ORDER — BISACODYL 5 MG PO TBEC
5.0000 mg | DELAYED_RELEASE_TABLET | Freq: Every day | ORAL | Status: DC | PRN
Start: 2017-06-09 — End: 2017-06-15
  Administered 2017-06-13: 5 mg via ORAL
  Filled 2017-06-09: qty 1

## 2017-06-09 MED ORDER — LIDOCAINE HCL (PF) 1 % IJ SOLN
INTRAMUSCULAR | Status: AC
  Filled 2017-06-09: qty 5

## 2017-06-09 MED ORDER — ENOXAPARIN SODIUM 40 MG/0.4ML ~~LOC~~ SOLN
40.0000 mg | Freq: Two times a day (BID) | SUBCUTANEOUS | Status: DC
  Administered 2017-06-09 – 2017-06-11 (×4): 40 mg via SUBCUTANEOUS
  Filled 2017-06-09 (×4): qty 0.4

## 2017-06-09 MED ORDER — HYDROCODONE-ACETAMINOPHEN 5-325 MG PO TABS
1.0000 | ORAL_TABLET | ORAL | Status: DC | PRN
  Administered 2017-06-09: 1 via ORAL
  Administered 2017-06-10 – 2017-06-13 (×4): 2 via ORAL
  Filled 2017-06-09 (×2): qty 2
  Filled 2017-06-09: qty 1
  Filled 2017-06-09 (×2): qty 2

## 2017-06-09 MED ORDER — TRAZODONE HCL 50 MG PO TABS: 25.0000 mg | ORAL_TABLET | Freq: Every evening | ORAL | Status: DC | PRN

## 2017-06-09 MED ORDER — ALBUTEROL SULFATE HFA 108 (90 BASE) MCG/ACT IN AERS: 2.0000 | INHALATION_SPRAY | Freq: Four times a day (QID) | RESPIRATORY_TRACT | Status: DC | PRN

## 2017-06-09 MED ORDER — ENOXAPARIN SODIUM 40 MG/0.4ML ~~LOC~~ SOLN: 40.0000 mg | SUBCUTANEOUS | Status: DC

## 2017-06-09 NOTE — ED Notes (Signed)
Called report to floor, [employer]. pt prepared for transport to floor.

## 2017-06-09 NOTE — ED Provider Notes (Signed)
[facility] Emergency Department Provider Note   ____________________________________________    I have reviewed the triage vital signs and the nursing notes.   HISTORY  Chief Complaint Recurrent Skin Infections     HPI Brenda Hicks is a 51 y.o. female who presents with complaints of skin infection/abscess to the mid back which is been present for approximately 5 days. She reports it has been intermittently draining but instead of improving it seems to be getting worse. She felt quite hot last night and is worried she had a fever. She does have diabetes. She has had these several times in the past and usually they would improve on her own but more recently she has required hospitalization several times.   Past Medical History:  Diagnosis Date  . Arthritis   . Asthma    WELL CONTROLLED  . Cancer of ear   . Diabetes mellitus without complication ([place])   . Hypertension   . Sleep apnea    USES CPAP    Patient Active Problem List   Diagnosis Date Noted  . Sepsis affecting skin ([place]) 06/09/2017  . Sebaceous cyst 06/01/2016  . Obstructive apnea 05/31/2016  . HLD (hyperlipidemia) 05/31/2016  . Acid reflux 05/31/2016  . Essential (primary) hypertension 05/31/2016  . Abnormal Pap smear of cervix 05/31/2016  . Arthritis of knee, degenerative 07/25/2015  . History of artificial joint 07/25/2015  . Gonalgia 02/17/2015  . Type 2 diabetes mellitus ([employer]) 10/23/2014  . Otalgia of left ear 09/24/2014  . 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 Diabetic peripheral neuropathy associated with type 2 diabetes mellitus ([place]) 05/16/2014  . Endometrial polyp 11/01/2013  . Fibroids, intramural 10/09/2013  . Pain due to knee joint prosthesis ([place]) 10/05/2013  . Body mass index (BMI) of 50-59.9 in adult ([place]) 10/03/2013  . Abnormal uterine bleeding 10/03/2013  . Adiposity  10/01/2013  . Excessive and frequent menstruation with irregular cycle 10/01/2013  . Adaptive colitis 10/01/2013  . History of migraine headaches 10/01/2013  . H/O malignant neoplasm of skin 10/01/2013  . Fatty liver disease, nonalcoholic 38/75/6433  . Diverticulitis 10/01/2013  . Current tobacco use 08/29/2013  . H/O total knee replacement 08/29/2013  . Cannot sleep 08/29/2013  . Dysmenorrhea 08/29/2013  . Airway hyperreactivity 08/29/2013  . Absolute anemia 08/29/2013  . Tobacco use 08/29/2013    Past Surgical History:  Procedure Laterality Date  . DILATION AND CURETTAGE OF UTERUS    . ENDOMETRIAL BIOPSY    . EXPLORATORY LAPAROTOMY     REMOVAL OF RUPTURED ECTOPIC  . HAND SURGERY    . HERNIA REPAIR     UMBILICAL  . JOINT REPLACEMENT Right    TKR  . KNEE ARTHROSCOPY    . KNEE SURGERY Right   . SHOULDER ARTHROSCOPY WITH BICEPSTENOTOMY Left 12/14/2016   Procedure: SHOULDER ARTHROSCOPY WITH BICEPSTENOTOMY;  Surgeon: Corky Mull, MD;  Location: [facility] ORS;  Service: Orthopedics;  Laterality: Left;  . SHOULDER ARTHROSCOPY WITH OPEN ROTATOR CUFF REPAIR Left 12/14/2016   Procedure: SHOULDER ARTHROSCOPY WITH OPEN ROTATOR CUFF REPAIR AND ARTHROSCOPIC ROTATOR CUFF REPAIR;  Surgeon: Corky Mull, MD;  Location: [facility] ORS;  Service: Orthopedics;  Laterality: Left;  . SHOULDER ARTHROSCOPY WITH SUBACROMIAL DECOMPRESSION Left 12/14/2016   Procedure: SHOULDER ARTHROSCOPY WITH SUBACROMIAL DECOMPRESSION;  Surgeon: Corky Mull, MD;  Location: [facility] ORS;  Service: Orthopedics;  Laterality: Left;  . TUBAL LIGATION  Prior to Admission medications   Medication Sig Start Date End Date Taking? Authorizing Provider  aspirin EC 81 MG tablet Take 81 mg by mouth daily.  01/14/16  Yes [provider]  atorvastatin (LIPITOR) 40 MG tablet Take 1 tablet (40 mg total) by mouth daily. 12/07/15 06/09/17 Yes Johnn Hai, PA-C  diclofenac (VOLTAREN) 75 MG EC tablet Take 1 tablet (75 mg total) by mouth  2 (two) times daily. 05/29/17  Yes Menshew, Dannielle Karvonen, PA-C  gabapentin (NEURONTIN) 300 MG capsule Take 300 mg by mouth 3 (three) times daily.    Yes [provider]  insulin glargine (LANTUS) 100 UNIT/ML injection Inject 100 Units into the skin at bedtime.    Yes [provider]  insulin lispro (HUMALOG) 100 UNIT/ML injection Inject 20 Units into the skin 3 (three) times daily with meals.  01/14/16 06/09/17 Yes [provider]  levonorgestrel (MIRENA) 20 MCG/24HR IUD 1 each by Intrauterine route once.   Yes [provider]  metFORMIN (GLUCOPHAGE) 1000 MG tablet Take 1,000 mg by mouth 2 (two) times daily.    Yes [provider]  zolpidem (AMBIEN) 5 MG tablet Take 1 tablet (5 mg total) by mouth at bedtime as needed for sleep. 12/14/16  Yes Poggi, Marshall Cork, MD  albuterol (PROVENTIL HFA;VENTOLIN HFA) 108 (90 Base) MCG/ACT inhaler Inhale 2 puffs into the lungs every 6 (six) hours as needed for wheezing or shortness of breath.    [provider]  ibuprofen (ADVIL,MOTRIN) 800 MG tablet Take 1 tablet (800 mg total) by mouth every 8 (eight) hours as needed. Patient not taking: Reported on 12/01/2016 07/26/16   Beers, Pierce Crane, PA-C  lisinopril (PRINIVIL,ZESTRIL) 40 MG tablet Take 40 mg by mouth every evening.  01/14/16 01/13/17  [provider]  naproxen sodium (ANAPROX) 275 MG tablet Take 1 tablet (275 mg total) by mouth 2 (two) times daily with a meal. Patient not taking: Reported on 06/09/2017 02/12/17 02/12/18  Schuyler Amor, MD  orphenadrine (NORFLEX) 100 MG tablet Take 1 tablet (100 mg total) by mouth 2 (two) times daily as needed for muscle spasms. 05/29/17   Menshew, Dannielle Karvonen, PA-C  oxyCODONE (ROXICODONE) 5 MG immediate release tablet Take 1-2 tablets (5-10 mg total) by mouth every 4 (four) hours as needed for severe pain. Patient not taking: Reported on 06/09/2017 12/14/16   Poggi, Marshall Cork, MD  oxyCODONE-acetaminophen (ROXICET) 5-325 MG  tablet Take 1 tablet by mouth every 4 (four) hours as needed for severe pain. Patient not taking: Reported on 06/09/2017 03/29/17   Gregor Hams, MD     Allergies Patient has no known allergies.  No family history on file.  Social History Social History  Substance Use Topics  . Smoking status: Current Every Day Smoker    Packs/day: 0.50    Years: 25.00    Types: Cigarettes  . Smokeless tobacco: Never Used  . Alcohol use No    Review of Systems  Constitutional: Subjective fever Eyes: No visual changes.  ENT: No sore throat. Cardiovascular: Denies chest pain. Respiratory: Denies shortness of breath.No cough Gastrointestinal: No abdominal pain.  No nausea, no vomiting.   Genitourinary: Negative for dysuria. Musculoskeletal: Negative for back pain. Skin: As above Neurological: Negative for headaches    ____________________________________________   PHYSICAL EXAM:  VITAL SIGNS: ED Triage Vitals  Enc Vitals Group     BP 06/09/17 0746 (!) 145/112     Pulse Rate 06/09/17 0742 (!) 120  Resp 06/09/17 0742 (!) 28     Temp 06/09/17 0742 99.8 F (37.7 C)     Temp src --      SpO2 06/09/17 0742 96 %     Weight 06/09/17 0742 127 kg (280 lb)     Height 06/09/17 0742 1.651 m (5\' 5" )     Head Circumference --      Peak Flow --      Pain Score 06/09/17 0741 10     Pain Loc --      Pain Edu? --      Excl. in [place]? --     Constitutional: Alert and oriented. No acute distress. Pleasant and interactive Eyes: Conjunctivae are normal.   Nose: No congestion/rhinnorhea. Mouth/Throat: Mucous membranes are moist.   Neck:  Painless ROM Cardiovascular: Tachycardia regular rhythm. Grossly normal heart sounds.  Good peripheral circulation. Respiratory: Normal respiratory effort.  No retractions. Lungs CTAB. Gastrointestinal: Soft and nontender. No distention.  No CVA tenderness.  Musculoskeletal: No lower extremity tenderness nor edema.  Warm and well perfused Neurologic:   Normal speech and language. No gross focal neurologic deficits are appreciated.  Skin:  Skin is warm, dry. Large area of induration central back approximately 6 x 9 cm with central pointing, appears to be scabbed over, no active drainage at this time. Psychiatric: Mood and affect are normal. Speech and behavior are normal.  ____________________________________________   LABS (all labs ordered are listed, but only abnormal results are displayed)  Labs Reviewed  CBC - Abnormal; Notable for the following:       Result Value   WBC 13.0 (*)    RDW 14.9 (*)    All other components within normal limits  COMPREHENSIVE METABOLIC PANEL - Abnormal; Notable for the following:    Chloride 97 (*)    Glucose, Bld 285 (*)    All other components within normal limits  URINALYSIS, ROUTINE W REFLEX MICROSCOPIC - Abnormal; Notable for the following:    Color, Urine YELLOW (*)    APPearance CLEAR (*)    Glucose, UA >=500 (*)    Bacteria, UA RARE (*)    Squamous Epithelial / LPF 0-5 (*)    All other components within normal limits  GLUCOSE, CAPILLARY - Abnormal; Notable for the following:    Glucose-Capillary 280 (*)    All other components within normal limits  CULTURE, BLOOD (ROUTINE X 2)  CULTURE, BLOOD (ROUTINE X 2)  URINE CULTURE  LACTIC ACID, PLASMA  LACTIC ACID, PLASMA  HIV ANTIBODY (ROUTINE TESTING)   ____________________________________________  EKG  None ____________________________________________  RADIOLOGY  None ____________________________________________   PROCEDURES  Procedure(s) performed: yes  INCISION AND DRAINAGE Performed by: Lavonia Drafts Consent: Verbal consent obtained. Risks and benefits: risks, benefits and alternatives were discussed Type: abscess  Body area:back  Anesthesia: local infiltration  Incision was made with a scalpel, slightly extended already mildly draining area to allow for more significant drainage and packing  Local anesthetic:  lidocaine 1%  Anesthetic total:31ml  Complexity: complex Blunt dissection to break up loculations  Drainage: purulent  Drainage amount: large   Packing material: 1/4 in iodoform gauze  Patient tolerance: Patient tolerated the procedure well with no immediate complications.       Critical Care performed: No ____________________________________________   INITIAL IMPRESSION / ASSESSMENT AND PLAN / ED COURSE  Pertinent labs & imaging results that were available during my care of the patient were reviewed by me and considered in my medical decision making (see  chart for details).  Patient presents with complaints of skin infection/abscess. She is diabetic, presents tachycardic with elevated temperature. Labs pending, IV placed.  I&D performed as above. ABX infusing. Admitted to hospitalist    ____________________________________________   FINAL CLINICAL IMPRESSION(S) / ED DIAGNOSES  Final diagnoses:  Cellulitis and abscess of trunk  Sepsis, due to unspecified organism [facility])      NEW MEDICATIONS STARTED DURING THIS VISIT:  Current Discharge Medication List       Note:  This document was prepared using Dragon voice recognition software and may include unintentional dictation errors.    Lavonia Drafts, MD 06/09/17 1226

## 2017-06-09 NOTE — Progress Notes (Signed)
Pharmacy Note - Anticoagulation  Patient with orders for enoxaparin 40mg  SQ Q24H for VTE prophylaxis  Estimated Creatinine Clearance: 91.3 mL/min (by C-G formula based on SCr of 1 mg/dL). Body mass index is 46.59 kg/m.  Will adjust to enoxaparin 40mg  SQ Q12H for BMI > 40 with CrCl > 46ml/min  Rexene Edison, PharmD, BCPS Clinical Pharmacist  06/09/2017 2:45 PM

## 2017-06-09 NOTE — Progress Notes (Signed)
Pharmacy Antibiotic Note  Brenda Hicks is a 51 y.o. female with diabetes admitted on 06/09/2017 with abscess/cellultis  Pharmacy has been consulted for vancomycin and zosyn dosing.  Plan: Vancomycin 1gm IV Q8H. Trough 7/21 at steady state. Target trough: 15-78mcg/ml  Zosyn 3.375gm IV Q8H extended infusion  Height: 5\' 5"  (165.1 cm) Weight: 280 lb (127 kg) IBW/kg (Calculated) : 57  Temp (24hrs), Avg:99.1 F (37.3 C), Min:98.6 F (37 C), Max:99.8 F (37.7 C)   Recent Labs Lab 06/09/17 0800 06/09/17 1131  WBC 13.0*  --   CREATININE 1.00  --   LATICACIDVEN 1.8 1.7    Estimated Creatinine Clearance: 91.3 mL/min (by C-G formula based on SCr of 1 mg/dL).    No Known Allergies  Antimicrobials this admission: Vancomycin 7/19 >> Zosyn 7/19 >>  Dose adjustments this admission:   Microbiology results: 7/19 Urine: 7/19 Blood x2:   Thank you for allowing pharmacy to be a part of this patient's care.  Chelsei Mcchesney C 06/09/2017 2:48 PM

## 2017-06-09 NOTE — ED Notes (Signed)
Attempted iv start x 2, unsuccessfully.  I was able to obtain second set of blood cultures,  Blood sent to lab for processing. Pt tolerated well.

## 2017-06-09 NOTE — Progress Notes (Addendum)
Inpatient Diabetes Program Recommendations  AACE/ADA: New Consensus Statement on Inpatient Glycemic Control (2015)  Target Ranges:  Prepandial:   less than 140 mg/dL      Peak postprandial:   less than 180 mg/dL (1-2 hours)      Critically ill patients:  140 - 180 mg/dL   Results for Brenda Hicks, Brenda Hicks (MRN [id number]) as of 06/09/2017 12:07  Ref. Range 06/09/2017 11:21  Glucose-Capillary Latest Ref Range: 65 - 99 mg/dL 280 (H)   Review of Glycemic Control  Diabetes history: DM2 Outpatient Diabetes medications: Lantus 100 units QHS, Humalog 20 units TID with meals, Metformin 1000 mg BID, Januvia 100 mg daily Current orders for Inpatient glycemic control: Lantus 50 units QHS, Novolog 0-9 units TID with meals, Metformin 1000 mg BID  Inpatient Diabetes Program Recommendations: Correction (SSI): Please consider ordering Novolog 0-5 units QHS for bedtime correction scale. Insulin - Meal Coverage: If post prandial glucose is consistently elevated, please consider ordering Novolog meal coverage (in addition to correction scale). HgbA1C: Please order an A1C to evaluate glycemic control over the past 2-3 months.  NOTE: In reviewing chart, noted in Care Everywhere that patient seen PCP on 04/26/17 and per office note "She is out of insulin, is not checking her BG's, not doing well from diet standpoint the past week. Minimal physical activity due to knee and shoulder pain". Also, A1C was 10.8% on 04/26/17 per Care Everywhere.  Addendum 06/09/17@12 :40-Spoke with patient over phone to verify DM medication regimen. Patient states that she takes: Lantus 100 units QHS, Humalog 20 units TID with meals, Metformin 1000 mg BID, Januvia 100 mg daily. Patient reports glucose has ranged from 82-430 mg/dl over the past few months. Inquired about taking DM medications as prescribed and she reports that she tries to take medications as prescribed but admits that sometimes she forgets to take medications from time to time.  Discussed information noted above from last office visit with PCP. Patient states that she ran out of insulin because she needed refills and had to wait until she could see the doctor and get the money to pay copays (she has Medicaid so medications should cost $3 each).  Patient reports that she has everything she needs for glucose monitoring at home but admits that she is almost out of insulins and oral DM medications. Patient request to receive refills for all DM medications at time of discharge. Patient reports that she uses vial/syringe for both Lantus and Humalog insulins. Discussed A1C of 10.8% on 04/26/17 (from [place]) and patient reports that this A1C is an improvement for her as her A1C is usually higher. Discussed importance of improving DM control and encouraged patient to notify her doctor before she runs out of medications and to take her medications consistently as prescribed. Patient verbalized understanding of information discussed.  Thanks, Barnie Alderman, RN, MSN, CDE Diabetes Coordinator Inpatient Diabetes Program [phone] (Team Pager from 8am to 5pm)

## 2017-06-09 NOTE — H&P (Signed)
[facility] at [place] NAME: Brenda Hicks    MR#:  [id number]  DATE OF BIRTH:  May 21, 1966  DATE OF ADMISSION:  06/09/2017  PRIMARY CARE PHYSICIAN: [facility]   REQUESTING/REFERRING PHYSICIAN: Dr. Corky Downs  CHIEF COMPLAINT: Back infection    Chief Complaint  Patient presents with  . Recurrent Skin Infections    HISTORY OF PRESENT ILLNESS:  Brenda Hicks  is a 51 y.o. female with macular obesity, hypertension, diabetes mellitus type 2 admitted because of skin infection in the back and fever. Patient has been having skin infections for almost 2 years in different parts of the body, now comes in with infection in the back  Started 3 days Ago and then changed  to puscollection associated with fever last night. Patient came to emergency room, found to have abscess in the back . Patient had abscess drained in the emergency room. Admitted for sepsis.  PAST MEDICAL HISTORY:   Past Medical History:  Diagnosis Date  . Arthritis   . Asthma    WELL CONTROLLED  . Cancer of ear   . Diabetes mellitus without complication ([place])   . Hypertension   . Sleep apnea    USES CPAP    PAST SURGICAL HISTOIRY:   Past Surgical History:  Procedure Laterality Date  . DILATION AND CURETTAGE OF UTERUS    . ENDOMETRIAL BIOPSY    . EXPLORATORY LAPAROTOMY     REMOVAL OF RUPTURED ECTOPIC  . HAND SURGERY    . HERNIA REPAIR     UMBILICAL  . JOINT REPLACEMENT Right    TKR  . KNEE ARTHROSCOPY    . KNEE SURGERY Right   . SHOULDER ARTHROSCOPY WITH BICEPSTENOTOMY Left 12/14/2016   Procedure: SHOULDER ARTHROSCOPY WITH BICEPSTENOTOMY;  Surgeon: Corky Mull, MD;  Location: [facility] ORS;  Service: Orthopedics;  Laterality: Left;  . SHOULDER ARTHROSCOPY WITH OPEN ROTATOR CUFF REPAIR Left 12/14/2016   Procedure: SHOULDER ARTHROSCOPY WITH OPEN ROTATOR CUFF REPAIR AND ARTHROSCOPIC ROTATOR CUFF REPAIR;  Surgeon: Corky Mull, MD;  Location: [facility] ORS;   Service: Orthopedics;  Laterality: Left;  . SHOULDER ARTHROSCOPY WITH SUBACROMIAL DECOMPRESSION Left 12/14/2016   Procedure: SHOULDER ARTHROSCOPY WITH SUBACROMIAL DECOMPRESSION;  Surgeon: Corky Mull, MD;  Location: [facility] ORS;  Service: Orthopedics;  Laterality: Left;  . TUBAL LIGATION      SOCIAL HISTORY:   Social History  Substance Use Topics  . Smoking status: Current Every Day Smoker    Packs/day: 0.50    Years: 25.00    Types: Cigarettes  . Smokeless tobacco: Never Used  . Alcohol use No    FAMILY HISTORY:  No family history on file.  DRUG ALLERGIES:  No Known Allergies  REVIEW OF SYSTEMS:  CONSTITUTIONAL:fever last  ngiht EYES: No blurred or double vision.  EARS, NOSE, AND THROAT: No tinnitus or ear pain.  RESPIRATORY: No cough, shortness of breath, wheezing or hemoptysis.  CARDIOVASCULAR: No chest pain, orthopnea, edema.  GASTROINTESTINAL: No nausea, vomiting, diarrhea or abdominal pain.  GENITOURINARY: No dysuria, hematuria.  ENDOCRINE: No polyuria, nocturia,  HEMATOLOGY: No anemia, easy bruising or bleeding SKIN: No rash or lesion. MUSCULOSKELETALBack pain, patient has open area of blister that was drained the emergency room and now has dressing in the upper back. NEUROLOGIC: No tingling, numbness, weakness.  PSYCHIATRY: No anxiety or depression.   MEDICATIONS AT HOME:   Prior to Admission medications   Medication Sig Start Date End Date Taking? Authorizing Provider  aspirin EC 81 MG tablet Take 81 mg by mouth daily.  01/14/16  Yes [provider]  atorvastatin (LIPITOR) 40 MG tablet Take 1 tablet (40 mg total) by mouth daily. 12/07/15 06/09/17 Yes Johnn Hai, PA-C  diclofenac (VOLTAREN) 75 MG EC tablet Take 1 tablet (75 mg total) by mouth 2 (two) times daily. 05/29/17  Yes Menshew, Dannielle Karvonen, PA-C  gabapentin (NEURONTIN) 300 MG capsule Take 300 mg by mouth 3 (three) times daily.    Yes [provider]  insulin glargine (LANTUS) 100  UNIT/ML injection Inject 100 Units into the skin at bedtime.    Yes [provider]  insulin lispro (HUMALOG) 100 UNIT/ML injection Inject 20 Units into the skin 3 (three) times daily with meals.  01/14/16 06/09/17 Yes [provider]  levonorgestrel (MIRENA) 20 MCG/24HR IUD 1 each by Intrauterine route once.   Yes [provider]  metFORMIN (GLUCOPHAGE) 1000 MG tablet Take 1,000 mg by mouth 2 (two) times daily.    Yes [provider]  sitaGLIPtin (JANUVIA) 100 MG tablet Take 100 mg by mouth daily. Patient reports taking Januvia 100 mg daily   Yes [provider]  zolpidem (AMBIEN) 5 MG tablet Take 1 tablet (5 mg total) by mouth at bedtime as needed for sleep. 12/14/16  Yes Poggi, Marshall Cork, MD  albuterol (PROVENTIL HFA;VENTOLIN HFA) 108 (90 Base) MCG/ACT inhaler Inhale 2 puffs into the lungs every 6 (six) hours as needed for wheezing or shortness of breath.    [provider]  ibuprofen (ADVIL,MOTRIN) 800 MG tablet Take 1 tablet (800 mg total) by mouth every 8 (eight) hours as needed. Patient not taking: Reported on 12/01/2016 07/26/16   Beers, Pierce [place], PA-C  lisinopril (PRINIVIL,ZESTRIL) 40 MG tablet Take 40 mg by mouth every evening.  01/14/16 01/13/17  [provider]  naproxen sodium (ANAPROX) 275 MG tablet Take 1 tablet (275 mg total) by mouth 2 (two) times daily with a meal. Patient not taking: Reported on 06/09/2017 02/12/17 02/12/18  Schuyler Amor, MD  orphenadrine (NORFLEX) 100 MG tablet Take 1 tablet (100 mg total) by mouth 2 (two) times daily as needed for muscle spasms. 05/29/17   Menshew, Dannielle Karvonen, PA-C  oxyCODONE (ROXICODONE) 5 MG immediate release tablet Take 1-2 tablets (5-10 mg total) by mouth every 4 (four) hours as needed for severe pain. Patient not taking: Reported on 06/09/2017 12/14/16   Poggi, Marshall Cork, MD  oxyCODONE-acetaminophen (ROXICET) 5-325 MG tablet Take 1 tablet by mouth every 4 (four) hours as needed for severe  pain. Patient not taking: Reported on 06/09/2017 03/29/17   Gregor Hams, MD      VITAL SIGNS:  Blood pressure 122/78, pulse (!) 106, temperature 98.6 F (37 C), temperature source Oral, resp. rate 18, height 5\' 5"  (1.651 m), weight 127 kg (280 lb), SpO2 100 %.  PHYSICAL EXAMINATION:  GENERAL:  51 y.o.-year-old patient lying in the bed with no acute distress. Morbidly obese female patient EYES: Pupils equal, round, reactive to light  . No scleral icterus. Extraocular muscles intact.  HEENT: Head atraumatic, normocephalic. Oropharynx and nasopharynx clear.  NECK:  Supple, no jugular venous distention. No thyroid enlargement, no tenderness.  LUNGS: Normal breath sounds bilaterally, no wheezing, rales,rhonchi or crepitation. No use of accessory muscles of respiration.  CARDIOVASCULAR: S1, S2 normal. No murmurs, rubs, or gallops.  ABDOMEN: Soft, nontender, nondistended. Bowel sounds present. No organomegaly or mass.  EXTREMITIES: No pedal edema, cyanosis, or clubbing.  NEUROLOGIC: Cranial nerves II through XII are intact. Muscle strength 5/5 in all extremities. Sensation intact. Gait not checked.  PSYCHIATRIC: The patient is alert and oriented x 3.  SKIN: Patient has abscess drained in the back and has dressing present there. Slightly tender to palpation.  LABORATORY PANEL:   CBC  Recent Labs Lab 06/09/17 0800  WBC 13.0*  HGB 13.7  HCT 41.9  PLT 274   ------------------------------------------------------------------------------------------------------------------  Chemistries   Recent Labs Lab 06/09/17 0800  NA 135  K 4.0  CL 97*  CO2 27  GLUCOSE 285*  BUN 12  CREATININE 1.00  CALCIUM 8.9  AST 22  ALT 21  ALKPHOS 106  BILITOT 0.7   ------------------------------------------------------------------------------------------------------------------  Cardiac Enzymes No results for input(s): TROPONINI in the last 168  hours. ------------------------------------------------------------------------------------------------------------------  RADIOLOGY:  Dg Chest 2 View  Result Date: 06/09/2017 CLINICAL DATA:  Sepsis with boil. EXAM: CHEST  2 VIEW COMPARISON:  05/04/2014 FINDINGS: There is no edema, consolidation, effusion, or pneumothorax. Mild linear atelectasis or scarring on the right. Normal heart size and mediastinal contours. IMPRESSION: Negative for pneumonia. Electronically Signed   By: Monte Fantasia M.D.   On: 06/09/2017 10:13    EKG:   Orders placed or performed during the hospital encounter of 03/28/17  . EKG 12-Lead  . EKG 12-Lead  . EKG    IMPRESSION AND PLAN:   Sepsis secondary to abscess in the back associated with cellulitis, recurrent infections: Admitted to hospitalist service, started on vancomycin, Zosyn. I will follow cultures from the IND that was done in the emergency room.  #2 diabetes type 2: Patient is on high-dose Lantus at home started on 50 units at night and resume 100 units at night from tomorrow, continue sliding scale insulin coverage. #3 tobacco abuse patient smokes about half pack of cigarettes a day .counseled  To quit . #5 hyperlipidemia continue statins; #6./hypertension;controlled. #7. diabetic neuropathy: Continue Neurontin.   All the records are reviewed and case discussed with ED provider. Management plans discussed with the patient, family and they are in agreement.  CODE STATUS: full  TOTAL TIME TAKING CARE OF THIS PATIENT: 55 minutes.    Epifanio Lesches M.D on 06/09/2017 at 2:08 PM  Between 7am to 6pm - Pager - [phone]  After 6pm go to [email or web address] - password [email or web address] [facility]  Office  [phone]  CC: Primary care physician; [place]  Note: This dictation was prepared with Dragon dictation along with smaller phrase technology. Any transcriptional errors that result from this process are  unintentional.

## 2017-06-09 NOTE — ED Triage Notes (Signed)
Boil to mid back , x4 day , pt with increased respiratory rate , sats 96% , tachycardia 120, temp 99.8,  Large quarter size pus filled raised area to mid back.  " it hurts to breath , my ribs feel sore"

## 2017-06-10 ENCOUNTER — Inpatient Hospital Stay: Payer: Medicaid Other

## 2017-06-10 LAB — CBC
HEMATOCRIT: 37.6 % (ref 35.0–47.0)
Hemoglobin: 12.4 g/dL (ref 12.0–16.0)
MCH: 30.2 pg (ref 26.0–34.0)
MCHC: 33 g/dL (ref 32.0–36.0)
MCV: 91.5 fL (ref 80.0–100.0)
Platelets: 220 10*3/uL (ref 150–440)
RBC: 4.11 MIL/uL (ref 3.80–5.20)
RDW: 14.7 % — AB (ref 11.5–14.5)
WBC: 13.6 10*3/uL — AB (ref 3.6–11.0)

## 2017-06-10 LAB — URINE CULTURE

## 2017-06-10 LAB — BASIC METABOLIC PANEL
Anion gap: 9 (ref 5–15)
BUN: 18 mg/dL (ref 6–20)
CO2: 26 mmol/L (ref 22–32)
CREATININE: 1.59 mg/dL — AB (ref 0.44–1.00)
Calcium: 8.5 mg/dL — ABNORMAL LOW (ref 8.9–10.3)
Chloride: 99 mmol/L — ABNORMAL LOW (ref 101–111)
GFR, EST AFRICAN AMERICAN: 43 mL/min — AB (ref >60)
GFR, EST NON AFRICAN AMERICAN: 37 mL/min — AB (ref >60)
Glucose, Bld: 213 mg/dL — ABNORMAL HIGH (ref 65–99)
POTASSIUM: 4.2 mmol/L (ref 3.5–5.1)
SODIUM: 134 mmol/L — AB (ref 135–145)

## 2017-06-10 LAB — GLUCOSE, CAPILLARY
GLUCOSE-CAPILLARY: 153 mg/dL — AB (ref 65–99)
GLUCOSE-CAPILLARY: 122 mg/dL — AB (ref 65–99)
Glucose-Capillary: 175 mg/dL — ABNORMAL HIGH (ref 65–99)
Glucose-Capillary: 201 mg/dL — ABNORMAL HIGH (ref 65–99)
Glucose-Capillary: 158 mg/dL — ABNORMAL HIGH (ref 65–99)
Glucose-Capillary: 131 mg/dL — ABNORMAL HIGH (ref 65–99)

## 2017-06-10 LAB — HIV ANTIBODY (ROUTINE TESTING W REFLEX): HIV Screen 4th Generation wRfx: NONREACTIVE

## 2017-06-10 LAB — CREATININE, SERUM
CREATININE: 2.35 mg/dL — AB (ref 0.44–1.00)
GFR calc Af Amer: 27 mL/min — ABNORMAL LOW (ref >60)
GFR, EST NON AFRICAN AMERICAN: 23 mL/min — AB (ref >60)

## 2017-06-10 LAB — HEMOGLOBIN A1C
Hgb A1c MFr Bld: 10.1 % — ABNORMAL HIGH (ref 4.8–5.6)
MEAN PLASMA GLUCOSE: 243 mg/dL

## 2017-06-10 LAB — VANCOMYCIN, RANDOM: VANCOMYCIN RM: 20

## 2017-06-10 LAB — LACTIC ACID, PLASMA
LACTIC ACID, VENOUS: 1.2 mmol/L (ref 0.5–1.9)
Lactic Acid, Venous: 2.3 mmol/L (ref 0.5–1.9)

## 2017-06-10 MED ORDER — INSULIN ASPART 100 UNIT/ML ~~LOC~~ SOLN
0.0000 [IU] | Freq: Three times a day (TID) | SUBCUTANEOUS | Status: DC
  Administered 2017-06-10: 2 [IU] via SUBCUTANEOUS
  Administered 2017-06-11 (×3): 1 [IU] via SUBCUTANEOUS
  Administered 2017-06-12: 18:00:00 3 [IU] via SUBCUTANEOUS
  Administered 2017-06-12 – 2017-06-13 (×2): 1 [IU] via SUBCUTANEOUS
  Filled 2017-06-10 (×7): qty 1

## 2017-06-10 MED ORDER — INSULIN GLARGINE 100 UNIT/ML ~~LOC~~ SOLN
70.0000 [IU] | Freq: Every day | SUBCUTANEOUS | Status: DC
  Administered 2017-06-10 – 2017-06-14 (×5): 70 [IU] via SUBCUTANEOUS
  Filled 2017-06-10 (×7): qty 0.7

## 2017-06-10 MED ORDER — MEROPENEM 1 G IV SOLR
1.0000 g | Freq: Two times a day (BID) | INTRAVENOUS | Status: DC
  Administered 2017-06-10 – 2017-06-11 (×2): 1 g via INTRAVENOUS
  Filled 2017-06-10 (×4): qty 1

## 2017-06-10 MED ORDER — SODIUM CHLORIDE 0.9 % IV SOLN
1.0000 g | Freq: Three times a day (TID) | INTRAVENOUS | Status: DC
  Filled 2017-06-10 (×2): qty 1

## 2017-06-10 MED ORDER — SODIUM CHLORIDE 0.9 % IV SOLN
INTRAVENOUS | Status: DC
  Administered 2017-06-10 – 2017-06-14 (×9): via INTRAVENOUS

## 2017-06-10 MED ORDER — PROMETHAZINE HCL 25 MG/ML IJ SOLN: 12.5000 mg | Freq: Four times a day (QID) | INTRAMUSCULAR | Status: DC | PRN

## 2017-06-10 MED ORDER — INSULIN ASPART 100 UNIT/ML ~~LOC~~ SOLN
3.0000 [IU] | Freq: Three times a day (TID) | SUBCUTANEOUS | Status: DC
  Administered 2017-06-10 – 2017-06-15 (×11): 3 [IU] via SUBCUTANEOUS
  Filled 2017-06-10 (×11): qty 1

## 2017-06-10 MED ORDER — HYDROMORPHONE HCL 1 MG/ML IJ SOLN
2.0000 mg | INTRAMUSCULAR | Status: DC | PRN
  Administered 2017-06-10: 18:00:00 2 mg via INTRAVENOUS
  Filled 2017-06-10: qty 2

## 2017-06-10 MED ORDER — INSULIN ASPART 100 UNIT/ML ~~LOC~~ SOLN
0.0000 [IU] | Freq: Every day | SUBCUTANEOUS | Status: DC
  Administered 2017-06-13: 22:00:00 2 [IU] via SUBCUTANEOUS
  Filled 2017-06-10: qty 1

## 2017-06-10 MED ORDER — SODIUM CHLORIDE 0.9 % IV BOLUS (SEPSIS)
500.0000 mL | Freq: Once | INTRAVENOUS | Status: AC
  Administered 2017-06-10: 500 mL via INTRAVENOUS

## 2017-06-10 NOTE — Consult Note (Signed)
[facility] Responded to Rapid Response; [place] not needed at present.

## 2017-06-10 NOTE — Progress Notes (Signed)
Pharmacy Antibiotic Note  Brenda Hicks is a 51 y.o. female with diabetes admitted on 06/09/2017 with abscess/cellultis  Pharmacy consulted to dose antibiotics.  Patient with AKI this morning creatinine up from 1.0 yesterday 1.59 >2.35  Vancomycin random: 37mcg/ml,  7 hr after dose  Plan: Discussed with MD will transition from zosyn to meropenem due to nephrotoxicity in conjunction with vancomycin.  Meropenem 1gm IV Q12H  MD would like to hold vancomycin at this time and re-evaluate renal function tomorrow morning.   Height: 5\' 5"  (165.1 cm) Weight: 280 lb (127 kg) IBW/kg (Calculated) : 57  Temp (24hrs), Avg:99 F (37.2 C), Min:98.5 F (36.9 C), Max:99.8 F (37.7 C)   Recent Labs Lab 06/09/17 0800 06/09/17 1131 06/10/17 0552 06/10/17 1010 06/10/17 1413  WBC 13.0*  --  13.6*  --   --   CREATININE 1.00  --  1.59*  --  2.35*  LATICACIDVEN 1.8 1.7  --   --   --   VANCORANDOM  --   --   --  20  --     Estimated Creatinine Clearance: 38.9 mL/min (A) (by C-G formula based on SCr of 2.35 mg/dL (H)).    No Known Allergies  Antimicrobials this admission: Vancomycin 7/19 >> Zosyn 7/19 >> 7/20 Meropenem 7/20 >>  Dose adjustments this admission:   Microbiology results: 7/19 Urine: 7/19 Blood x2:   Thank you for allowing pharmacy to be a part of this patient's care.  Joanthan Hlavacek C 06/10/2017 2:43 PM

## 2017-06-10 NOTE — Progress Notes (Signed)
Per pharmacy don't give vanc and do not give naproxen this morning

## 2017-06-10 NOTE — Consult Note (Addendum)
[facility] Nurse wound consult note Reason for Consult: Consult requested for back wound.  Pt states she has abscessed areas which occur frequently.  Some rupture and drain spontaneously, and others have required an I&D.  She had one drained in the ER yesterday to her back. Wound type: Full thickness wound to middle back Measurement: Removed previous packing strip.  Wound is .3X.3X1cm with .5cm undermining. Large amt thick tan pus draining when packing strip removed, no odor. Induration and erythremia surrounding the wound to 3 cm, painful to touch. Wound bed: Narrow opening is difficult to visualize; appears to be red Left posterior breast fold with full thickness wound; .2X.2X.2cm, red and moist, small amt tan drainage, no odor.  Foam dressing to protect and absorb drainage, since wound is shallow. Dressing procedure/placement/frequency: Demonstrated procedure for changing dressing to back when at home using a packing strip and swab to fill, then covering with gauze and tape.  Pt verbalized understanding and states her husband will be able to perform the procedure.  She denies further questions. Please re-consult if further assistance is needed.  Thank-you,  Julien Girt MSN, [place], [place], [place], [place]

## 2017-06-10 NOTE — Progress Notes (Signed)
CRITICAL VALUE ALERT  Critical Value:  Lactic Acid of 2.3  Date & Time Notied:  06/10/2017 at 2228  Provider Notified: Dr. Joseph Art Orders Received/Actions taken: No new orders made

## 2017-06-10 NOTE — Progress Notes (Addendum)
Inpatient Diabetes Program Recommendations  AACE/ADA: New Consensus Statement on Inpatient Glycemic Control (2015)  Target Ranges:  Prepandial:   less than 140 mg/dL      Peak postprandial:   less than 180 mg/dL (1-2 hours)      Critically ill patients:  140 - 180 mg/dL   Lab Results  Component Value Date   GLUCAP 175 (H) 06/10/2017   HGBA1C 10.1 (H) 06/09/2017    Review of Glycemic Control  Results for JAYLIANNA, TATLOCK (MRN [id number]) as of 06/10/2017 09:57  Ref. Range 06/09/2017 11:21 06/09/2017 16:56 06/09/2017 20:53 06/10/2017 07:37  Glucose-Capillary Latest Ref Range: 65 - 99 mg/dL 280 (H) 202 (H) 260 (H) 175 (H)    Diabetes history: DM2 Outpatient Diabetes medications: Lantus 100 units QHS, Humalog 20 units TID with meals, Metformin 1000 mg BID, Januvia 100 mg daily Current orders for Inpatient glycemic control: Lantus 70 units QHS, Novolog 0-9 units TID with meals,  Novolog 0-5 units qhs  Inpatient Diabetes Program Recommendations:  Please consider ordering Novolog 3 units meal coverage (in addition to correction scale).  Please order an A1C to evaluate glycemic control over the past 2-3 months.  Gentry Fitz, RN, BA, MHA, CDE Diabetes Coordinator Inpatient Diabetes Program  [phone] (Team Pager) [phone] ([place]) 06/10/2017 10:01 AM

## 2017-06-10 NOTE — Progress Notes (Signed)
Patient was being transported off the floor to CT and had become less alert and lethargic, she was transported back to room,  vital signs and blood sugar taken and were WNL, rapid response had been called by charge.   Patient remained alert and oriented , she had some emesis (240 cc) and returned to her baseline.

## 2017-06-10 NOTE — Progress Notes (Signed)
[facility] [place] NAME: Brenda Hicks    MR#:  [id number]  DATE OF BIRTH:  [birth year]  SUBJECTIVE: Admitted for back infection, the patient had abscess in the  In back drained in the emergency room. Started on vancomycin, Zosyn and noted to have acute kidney injury. Patient denies any complaints except back pain.   CHIEF COMPLAINT:   Chief Complaint  Patient presents with  . Recurrent Skin Infections    REVIEW OF SYSTEMS:   ROS CONSTITUTIONAL: No fever, fatigue or weakness.  EYES: No blurred or double vision.  EARS, NOSE, AND THROAT: No tinnitus or ear pain.  RESPIRATORY: No cough, shortness of breath, wheezing or hemoptysis.  CARDIOVASCULAR: No chest pain, orthopnea, edema.  GASTROINTESTINAL: No nausea, vomiting, diarrhea or abdominal pain.  GENITOURINARY: No dysuria, hematuria.  ENDOCRINE: No polyuria, nocturia,  HEMATOLOGY: No anemia, easy bruising or bleeding SKIN: No rash or lesion. MUSCULOSKELETAL:Pain in the back, tenderness around the draining area in the back.   NEUROLOGIC: No tingling, numbness, weakness.  PSYCHIATRY: No anxiety or depression.   DRUG ALLERGIES:  No Known Allergies  VITALS:  Blood pressure 109/68, pulse 93, temperature 98.6 F (37 C), resp. rate (!) 23, height 5\' 5"  (1.651 m), weight 127 kg (280 lb), SpO2 98 %.  PHYSICAL EXAMINATION:  GENERAL:  51 y.o.-year-old patient lying in the bed with no acute distress.  EYES: Pupils equal, round, reactive to light and accommodation. No scleral icterus. Extraocular muscles intact.  HEENT: Head atraumatic, normocephalic. Oropharynx and nasopharynx clear.  NECK:  Supple, no jugular venous distention. No thyroid enlargement, no tenderness.  LUNGS: Normal breath sounds bilaterally, no wheezing, rales,rhonchi or crepitation. No use of accessory muscles of respiration.  CARDIOVASCULAR: S1, S2 normal. No murmurs, rubs, or gallops.  ABDOMEN: Soft, nontender,  nondistended. Bowel sounds present. No organomegaly or mass.  EXTREMITIES: No pedal edema, cyanosis, or clubbing.  NEUROLOGIC: Cranial nerves II through XII are intact. Muscle strength 5/5 in all extremities. Sensation intact. Gait not checked.  PSYCHIATRIC: The patient is alert and oriented x 3.  SKIN: No obvious rash, lesion, or ulcer.    LABORATORY PANEL:   CBC  Recent Labs Lab 06/10/17 0552  WBC 13.6*  HGB 12.4  HCT 37.6  PLT 220   ------------------------------------------------------------------------------------------------------------------  Chemistries   Recent Labs Lab 06/09/17 0800 06/10/17 0552  NA 135 134*  K 4.0 4.2  CL 97* 99*  CO2 27 26  GLUCOSE 285* 213*  BUN 12 18  CREATININE 1.00 1.59*  CALCIUM 8.9 8.5*  AST 22  --   ALT 21  --   ALKPHOS 106  --   BILITOT 0.7  --    ------------------------------------------------------------------------------------------------------------------  Cardiac Enzymes No results for input(s): TROPONINI in the last 168 hours. ------------------------------------------------------------------------------------------------------------------  RADIOLOGY:  Dg Chest 2 View  Result Date: 06/09/2017 CLINICAL DATA:  Sepsis with boil. EXAM: CHEST  2 VIEW COMPARISON:  05/04/2014 FINDINGS: There is no edema, consolidation, effusion, or pneumothorax. Mild linear atelectasis or scarring on the right. Normal heart size and mediastinal contours. IMPRESSION: Negative for pneumonia. Electronically Signed   By: Monte Fantasia M.D.   On: 06/09/2017 10:13    EKG:   Orders placed or performed during the hospital encounter of 03/28/17  . EKG 12-Lead  . EKG 12-Lead  . EKG    ASSESSMENT AND PLAN:   1. Sepsis secondary to abscess in the back associated with cellulitis, recurrent infections: Patient has extensive skin infections  concerning for MRSA infection. On vancomycin, Zosyn. WBC is slightly higher than yesterday. But patient is  afebrile. Continue Zosyn but hold vancomycin because of acute kidney injury.  #2.back abscess s/p I&D in ER.follow cultures,consult wound care for dressing instructions 3. Acute kidney injury likely due to sepsis, vancomycin-induced: Gentle hydration, hold vancomycin, follow renal function, hold lisinopril. 4.morbid obesity with the diabetes mellitus type 2, tobacco abuse, essential hypertension, hyperlipidemia patient is counseled about healthy diet options, addressed. #5 hyperlipidemia: Continue statins. Patient needs to stay in the hospital because of acute kidney injury. Follow the culture that's done from I&D in the emergency room.  Discussed with patient's nurse.   All the records are reviewed and case discussed with Care Management/Social Workerr. Management plans discussed with the patient, family and they are in agreement.  CODE STATUS:full  TOTAL TIME TAKING CARE OF THIS PATIENT: 35 minutes.   POSSIBLE D/C IN 1 DAYS, DEPENDING ON CLINICAL CONDITION.   Epifanio Lesches M.D on 06/10/2017 at 9:23 AM  Between 7am to 6pm - Pager - [phone]  After 6pm go to [email or web address] - password [email or web address] [facility] [facility]  Office  [phone]  CC: Primary care physician; [place], [place] [facility]   Note: This dictation was prepared with Dragon dictation along with smaller phrase technology. Any transcriptional errors that result from this process are unintentional.

## 2017-06-11 ENCOUNTER — Inpatient Hospital Stay: Payer: Medicaid Other

## 2017-06-11 LAB — LIPID PANEL
CHOL/HDL RATIO: 4.4 ratio
Cholesterol: 135 mg/dL (ref 0–200)
HDL: 31 mg/dL — AB (ref >40)
LDL CALC: 81 mg/dL (ref 0–99)
Triglycerides: 113 mg/dL (ref <150)
VLDL: 23 mg/dL (ref 0–40)

## 2017-06-11 LAB — BASIC METABOLIC PANEL
ANION GAP: 9 (ref 5–15)
BUN: 27 mg/dL — ABNORMAL HIGH (ref 6–20)
CHLORIDE: 98 mmol/L — AB (ref 101–111)
CO2: 27 mmol/L (ref 22–32)
Calcium: 8 mg/dL — ABNORMAL LOW (ref 8.9–10.3)
Creatinine, Ser: 3.4 mg/dL — ABNORMAL HIGH (ref 0.44–1.00)
GFR calc Af Amer: 17 mL/min — ABNORMAL LOW (ref >60)
GFR calc non Af Amer: 15 mL/min — ABNORMAL LOW (ref >60)
GLUCOSE: 167 mg/dL — AB (ref 65–99)
POTASSIUM: 4.2 mmol/L (ref 3.5–5.1)
Sodium: 134 mmol/L — ABNORMAL LOW (ref 135–145)

## 2017-06-11 LAB — LACTIC ACID, PLASMA: Lactic Acid, Venous: 2.1 mmol/L (ref 0.5–1.9)

## 2017-06-11 LAB — GLUCOSE, CAPILLARY
GLUCOSE-CAPILLARY: 153 mg/dL — AB (ref 65–99)
Glucose-Capillary: 149 mg/dL — ABNORMAL HIGH (ref 65–99)
Glucose-Capillary: 149 mg/dL — ABNORMAL HIGH (ref 65–99)
Glucose-Capillary: 143 mg/dL — ABNORMAL HIGH (ref 65–99)

## 2017-06-11 LAB — VANCOMYCIN, RANDOM: VANCOMYCIN RM: 13

## 2017-06-11 MED ORDER — GABAPENTIN 300 MG PO CAPS
300.0000 mg | ORAL_CAPSULE | Freq: Every day | ORAL | Status: DC
  Administered 2017-06-12 – 2017-06-14 (×3): 300 mg via ORAL
  Filled 2017-06-11 (×3): qty 1

## 2017-06-11 MED ORDER — LINEZOLID 600 MG/300ML IV SOLN
600.0000 mg | Freq: Two times a day (BID) | INTRAVENOUS | Status: DC
  Administered 2017-06-11 – 2017-06-13 (×5): 600 mg via INTRAVENOUS
  Filled 2017-06-11 (×6): qty 300

## 2017-06-11 MED ORDER — MEROPENEM-SODIUM CHLORIDE 500 MG/50ML IV SOLR
500.0000 mg | Freq: Two times a day (BID) | INTRAVENOUS | Status: DC
  Administered 2017-06-11 – 2017-06-13 (×4): 500 mg via INTRAVENOUS
  Filled 2017-06-11 (×5): qty 50

## 2017-06-11 MED ORDER — ENOXAPARIN SODIUM 40 MG/0.4ML ~~LOC~~ SOLN: 40.0000 mg | SUBCUTANEOUS | Status: DC

## 2017-06-11 NOTE — Plan of Care (Signed)
During physical assessment, it was noted that yellow arm band is in place along with yellow socks on.

## 2017-06-11 NOTE — Consult Note (Signed)
[facility] CONSULT NOTE    Date: 06/11/2017                  Patient Name:  Brenda Hicks  MRN: [id number]  DOB: 16-Mar-1966  Age / Sex: 51 y.o., female         PCP: Scot [facility], [facility]                 Service Requesting Consult: Hospitalist                 Reason for Consult: Acute renal failure            History of Present Illness: Patient is a 51 y.o. female with a PMHx of Morbid obesity, diabetes mellitus type 2, hypertension, obstructive sleep apnea, asthma, who was admitted to [facility] on 06/09/2017 for evaluation of back abscess. Patient is status post incision and drainage of back abscess. She has been having some back pain as well. Patient has been on multiple NSAIDs including naproxen and ibuprofen. Her by mouth intake has been rather poor over the past several days.  The patient's baseline creatinine appears to be 1.0. Currently her creatinine is up to 3.4.  She's also had some nausea and vomiting as well.  In addition patient was on lisinopril at home for hypertension control.   Medications: Outpatient medications: Prescriptions Prior to Admission  Medication Sig Dispense Refill Last Dose  . aspirin EC 81 MG tablet Take 81 mg by mouth daily.    06/08/2017 at 0800  . atorvastatin (LIPITOR) 40 MG tablet Take 1 tablet (40 mg total) by mouth daily. 15 tablet 11 06/08/2017 at 0800  . diclofenac (VOLTAREN) 75 MG EC tablet Take 1 tablet (75 mg total) by mouth 2 (two) times daily. 30 tablet 1 06/08/2017 at 1800  . gabapentin (NEURONTIN) 300 MG capsule Take 300 mg by mouth 3 (three) times daily.    06/08/2017 at 2000  . insulin glargine (LANTUS) 100 UNIT/ML injection Inject 100 Units into the skin at bedtime.    06/08/2017 at Unknown time  . insulin lispro (HUMALOG) 100 UNIT/ML injection Inject 20 Units into the skin 3 (three) times daily with meals.    06/08/2017 at 1800  . levonorgestrel (MIRENA) 20 MCG/24HR IUD 1 each by Intrauterine route once.    06/08/2017 at 1800  . metFORMIN (GLUCOPHAGE) 1000 MG tablet Take 1,000 mg by mouth 2 (two) times daily.    06/08/2017 at 1800  . sitaGLIPtin (JANUVIA) 100 MG tablet Take 100 mg by mouth daily. Patient reports taking Januvia 100 mg daily     . zolpidem (AMBIEN) 5 MG tablet Take 1 tablet (5 mg total) by mouth at bedtime as needed for sleep. 30 tablet 0 prn at prn  . albuterol (PROVENTIL HFA;VENTOLIN HFA) 108 (90 Base) MCG/ACT inhaler Inhale 2 puffs into the lungs every 6 (six) hours as needed for wheezing or shortness of breath.   prn at prn  . ibuprofen (ADVIL,MOTRIN) 800 MG tablet Take 1 tablet (800 mg total) by mouth every 8 (eight) hours as needed. (Patient not taking: Reported on 12/01/2016) 30 tablet 0 Not Taking at Unknown time  . lisinopril (PRINIVIL,ZESTRIL) 40 MG tablet Take 40 mg by mouth every evening.    12/13/2016 at Unknown time  . naproxen sodium (ANAPROX) 275 MG tablet Take 1 tablet (275 mg total) by mouth 2 (two) times daily with a meal. (Patient not taking: Reported on 06/09/2017) 15 tablet 0 Not Taking at  Unknown time  . orphenadrine (NORFLEX) 100 MG tablet Take 1 tablet (100 mg total) by mouth 2 (two) times daily as needed for muscle spasms. 20 tablet 0 prn at prn  . oxyCODONE (ROXICODONE) 5 MG immediate release tablet Take 1-2 tablets (5-10 mg total) by mouth every 4 (four) hours as needed for severe pain. (Patient not taking: Reported on 06/09/2017) 60 tablet 0 Not Taking at Unknown time  . oxyCODONE-acetaminophen (ROXICET) 5-325 MG tablet Take 1 tablet by mouth every 4 (four) hours as needed for severe pain. (Patient not taking: Reported on 06/09/2017) 20 tablet 0 Not Taking at prn    Current medications: Current Facility-Administered Medications  Medication Dose Route Frequency Provider Last Rate Last Dose  . 0.9 %  sodium chloride infusion   Intravenous Continuous Epifanio Lesches, MD 100 mL/hr at 06/11/17 0047    . acetaminophen (TYLENOL) tablet 650 mg  650 mg Oral Q6H PRN  Epifanio Lesches, MD   650 mg at 06/11/17 3419   Or  . acetaminophen (TYLENOL) suppository 650 mg  650 mg Rectal Q6H PRN Epifanio Lesches, MD      . albuterol (PROVENTIL) (2.5 MG/3ML) 0.083% nebulizer solution 2.5 mg  2.5 mg Nebulization Q6H PRN Epifanio Lesches, MD      . aspirin EC tablet 81 mg  81 mg Oral Daily Epifanio Lesches, MD   81 mg at 06/11/17 0846  . atorvastatin (LIPITOR) tablet 40 mg  40 mg Oral Daily Epifanio Lesches, MD   40 mg at 06/11/17 0846  . bisacodyl (DULCOLAX) EC tablet 5 mg  5 mg Oral Daily PRN Epifanio Lesches, MD      . docusate sodium (COLACE) capsule 100 mg  100 mg Oral BID Epifanio Lesches, MD   100 mg at 06/11/17 0846  . [START ON 06/12/2017] enoxaparin (LOVENOX) injection 40 mg  40 mg Subcutaneous Q24H Epifanio Lesches, MD      . gabapentin (NEURONTIN) capsule 300 mg  300 mg Oral TID Epifanio Lesches, MD   300 mg at 06/11/17 0047  . HYDROcodone-acetaminophen (NORCO/VICODIN) 5-325 MG per tablet 1-2 tablet  1-2 tablet Oral Q4H PRN Epifanio Lesches, MD   2 tablet at 06/10/17 2121  . insulin aspart (novoLOG) injection 0-5 Units  0-5 Units Subcutaneous QHS Epifanio Lesches, MD      . insulin aspart (novoLOG) injection 0-9 Units  0-9 Units Subcutaneous TID WC Epifanio Lesches, MD   1 Units at 06/11/17 0100  . insulin aspart (novoLOG) injection 3 Units  3 Units Subcutaneous TID WC Epifanio Lesches, MD   3 Units at 06/11/17 0848  . insulin glargine (LANTUS) injection 70 Units  70 Units Subcutaneous QHS Epifanio Lesches, MD   70 Units at 06/10/17 2218  . linezolid (ZYVOX) IVPB 600 mg  600 mg Intravenous Q12H Epifanio Lesches, MD 300 mL/hr at 06/11/17 0847 600 mg at 06/11/17 0847  . meropenem (MERREM) 1 g in sodium chloride 0.9 % 100 mL IVPB  1 g Intravenous Q12H Epifanio Lesches, MD   Stopped at 06/11/17 0914  . ondansetron (ZOFRAN) tablet 4 mg  4 mg Oral Q6H PRN Epifanio Lesches, MD       Or  . ondansetron  (ZOFRAN) injection 4 mg  4 mg Intravenous Q6H PRN Epifanio Lesches, MD   4 mg at 06/11/17 6222  . orphenadrine (NORFLEX) 12 hr tablet 100 mg  100 mg Oral BID PRN Epifanio Lesches, MD      . oxyCODONE (Oxy IR/ROXICODONE) immediate release tablet 5-10 mg  5-10  mg Oral Q4H PRN Epifanio Lesches, MD   10 mg at 06/11/17 0852  . promethazine (PHENERGAN) injection 12.5-25 mg  12.5-25 mg Intravenous Q6H PRN Lance Coon, MD          Allergies: No Known Allergies    Past Medical History: Past Medical History:  Diagnosis Date  . Arthritis   . Asthma    WELL CONTROLLED  . Cancer of ear   . Diabetes mellitus without complication ([place])   . Hypertension   . Sleep apnea    USES CPAP     Past Surgical History: Past Surgical History:  Procedure Laterality Date  . DILATION AND CURETTAGE OF UTERUS    . ENDOMETRIAL BIOPSY    . EXPLORATORY LAPAROTOMY     REMOVAL OF RUPTURED ECTOPIC  . HAND SURGERY    . HERNIA REPAIR     UMBILICAL  . JOINT REPLACEMENT Right    TKR  . KNEE ARTHROSCOPY    . KNEE SURGERY Right   . SHOULDER ARTHROSCOPY WITH BICEPSTENOTOMY Left 12/14/2016   Procedure: SHOULDER ARTHROSCOPY WITH BICEPSTENOTOMY;  Surgeon: Corky Mull, MD;  Location: [facility] ORS;  Service: Orthopedics;  Laterality: Left;  . SHOULDER ARTHROSCOPY WITH OPEN ROTATOR CUFF REPAIR Left 12/14/2016   Procedure: SHOULDER ARTHROSCOPY WITH OPEN ROTATOR CUFF REPAIR AND ARTHROSCOPIC ROTATOR CUFF REPAIR;  Surgeon: Corky Mull, MD;  Location: [facility] ORS;  Service: Orthopedics;  Laterality: Left;  . SHOULDER ARTHROSCOPY WITH SUBACROMIAL DECOMPRESSION Left 12/14/2016   Procedure: SHOULDER ARTHROSCOPY WITH SUBACROMIAL DECOMPRESSION;  Surgeon: Corky Mull, MD;  Location: [facility] ORS;  Service: Orthopedics;  Laterality: Left;  . TUBAL LIGATION       Family History: No family history on file.   Social History: Social History   Social History  . Marital status: Married    Spouse name: N/A  . Number of  children: N/A  . Years of education: N/A   Occupational History  . Not on file.   Social History Main Topics  . Smoking status: Current Every Day Smoker    Packs/day: 0.50    Years: 25.00    Types: Cigarettes  . Smokeless tobacco: Never Used  . Alcohol use No  . Drug use: No  . Sexual activity: Yes    Birth control/ protection: IUD   Other Topics Concern  . Not on file   Social History Narrative  . No narrative on file     Review of Systems: Review of Systems  Constitutional: Positive for malaise/fatigue. Negative for chills and fever.  HENT: Negative for ear discharge, hearing loss and nosebleeds.   Eyes: Negative for blurred vision and double vision.  Respiratory: Negative for cough and hemoptysis.   Cardiovascular: Negative for chest pain, palpitations and orthopnea.  Gastrointestinal: Positive for nausea and vomiting. Negative for abdominal pain and heartburn.  Genitourinary: Negative for frequency and urgency.  Musculoskeletal: Positive for back pain. Negative for falls.  Skin: Negative for itching and rash.  Neurological: Negative for dizziness and focal weakness.  Endo/Heme/Allergies: Negative for polydipsia. Does not bruise/bleed easily.  Psychiatric/Behavioral: Negative for depression. The patient is not nervous/anxious.      Vital Signs: Blood pressure 115/65, pulse 92, temperature 98.5 F (36.9 C), resp. rate 18, height 5\' 5"  (1.651 m), weight 127.2 kg (280 lb 6.4 oz), SpO2 91 %.  Weight trends: Filed Weights   06/09/17 0742 06/11/17 0500  Weight: 127 kg (280 lb) 127.2 kg (280 lb 6.4 oz)    Physical Exam: General: NAD,  morbidly obese female  Head: Normocephalic, atraumatic.  Eyes: Anicteric, EOMI  Nose: Mucous membranes moist, not inflammed, nonerythematous.  Throat: Oropharynx nonerythematous, no exudate appreciated.   Neck: Supple, trachea midline.  Lungs:  Normal respiratory effort. Clear to auscultation BL without crackles or wheezes.  Heart:  RRR. S1 and S2 normal without gallop, murmur, or rubs.  Abdomen:  BS normoactive. Soft, Nondistended, non-tender.  No masses or organomegaly.  Extremities: No pretibial edema.  Neurologic: A&O X3, Motor strength is 5/5 in the all 4 extremities  Skin: No visible rashes, scars.    Lab results: Basic Metabolic Panel:  Recent Labs Lab 06/09/17 0800 06/10/17 0552 06/10/17 1413 06/11/17 0643  NA 135 134*  --  134*  K 4.0 4.2  --  4.2  CL 97* 99*  --  98*  CO2 27 26  --  27  GLUCOSE 285* 213*  --  167*  BUN 12 18  --  27*  CREATININE 1.00 1.59* 2.35* 3.40*  CALCIUM 8.9 8.5*  --  8.0*    Liver Function Tests:  Recent Labs Lab 06/09/17 0800  AST 22  ALT 21  ALKPHOS 106  BILITOT 0.7  PROT 8.0  ALBUMIN 3.7   No results for input(s): LIPASE, AMYLASE in the last 168 hours. No results for input(s): AMMONIA in the last 168 hours.  CBC:  Recent Labs Lab 06/09/17 0800 06/10/17 0552  WBC 13.0* 13.6*  HGB 13.7 12.4  HCT 41.9 37.6  MCV 91.0 91.5  PLT 274 220    Cardiac Enzymes: No results for input(s): CKTOTAL, CKMB, CKMBINDEX, TROPONINI in the last 168 hours.  BNP: Invalid input(s): POCBNP  CBG:  Recent Labs Lab 06/10/17 1654 06/10/17 1912 06/10/17 2057 06/10/17 2111 06/11/17 0740  GLUCAP 153* 158* 131* 24* 149*    Microbiology: Results for orders placed or performed during the hospital encounter of 06/09/17  Blood Culture (routine x 2)     Status: None (Preliminary result)   Collection Time: 06/09/17  8:00 AM  Result Value Ref Range Status   Specimen Description BLOOD LEFT HAND  Final   Special Requests   Final    BOTTLES DRAWN AEROBIC AND ANAEROBIC Blood Culture adequate volume   Culture NO GROWTH 2 DAYS  Final   Report Status PENDING  Incomplete  Blood Culture (routine x 2)     Status: None (Preliminary result)   Collection Time: 06/09/17  8:39 AM  Result Value Ref Range Status   Specimen Description BLOOD LEFT AC  Final   Special Requests    Final    BOTTLES DRAWN AEROBIC AND ANAEROBIC Blood Culture adequate volume   Culture NO GROWTH 2 DAYS  Final   Report Status PENDING  Incomplete  Urine culture     Status: Abnormal   Collection Time: 06/09/17  8:41 AM  Result Value Ref Range Status   Specimen Description URINE, RANDOM  Final   Special Requests NONE  Final   Culture MULTIPLE SPECIES PRESENT, SUGGEST RECOLLECTION (A)  Final   Report Status 06/10/2017 FINAL  Final  CULTURE, BLOOD (ROUTINE X 2) w Reflex to ID Panel     Status: None (Preliminary result)   Collection Time: 06/10/17  6:09 PM  Result Value Ref Range Status   Specimen Description BLOOD BLOOD RIGHT ARM  Final   Special Requests   Final    BOTTLES DRAWN AEROBIC AND ANAEROBIC Blood Culture adequate volume   Culture NO GROWTH < 12 HOURS  Final   Report  Status PENDING  Incomplete  CULTURE, BLOOD (ROUTINE X 2) w Reflex to ID Panel     Status: None (Preliminary result)   Collection Time: 06/10/17  9:37 PM  Result Value Ref Range Status   Specimen Description BLOOD LEFT HAND  Final   Special Requests   Final    BOTTLES DRAWN AEROBIC AND ANAEROBIC Blood Culture adequate volume   Culture NO GROWTH < 12 HOURS  Final   Report Status PENDING  Incomplete    Coagulation Studies: No results for input(s): LABPROT, INR in the last 72 hours.  Urinalysis:  Recent Labs  06/09/17 0840  COLORURINE YELLOW*  LABSPEC 1.024  PHURINE 5.0  GLUCOSEU >=500*  HGBUR NEGATIVE  BILIRUBINUR NEGATIVE  KETONESUR NEGATIVE  PROTEINUR NEGATIVE  NITRITE NEGATIVE  LEUKOCYTESUR NEGATIVE      Imaging: Ct Thoracic Spine Wo Contrast  Result Date: 06/10/2017 CLINICAL DATA:  51 y/o  F; skin infection of the back with fever. EXAM: CT THORACIC SPINE WITHOUT CONTRAST TECHNIQUE: Multidetector CT images of the thoracic were obtained using the standard protocol without intravenous contrast. COMPARISON:  None. FINDINGS: Alignment: Normal. Vertebrae: No acute fracture or focal pathologic  process. No bony destructive or erosive change. Paraspinal and other soft tissues: Negative. No appreciable inflammation or fluid collection. Disc levels: Mild discogenic degenerative changes with small anterior marginal osteophytes. IMPRESSION: No bony findings of osteomyelitis. No deep soft tissue inflammatory change identified. Electronically Signed   By: Kristine Garbe M.D.   On: 06/10/2017 20:46   Ct Lumbar Spine Wo Contrast  Result Date: 06/10/2017 CLINICAL DATA:  51 y/o  F; skin infection of the back with fever. EXAM: CT LUMBAR SPINE WITHOUT CONTRAST TECHNIQUE: Multidetector CT imaging of the lumbar spine was performed without intravenous contrast administration. Multiplanar CT image reconstructions were also generated. COMPARISON:  None. FINDINGS: Segmentation: 5 lumbar type vertebrae. Alignment: Normal. Vertebrae: No acute fracture or focal pathologic process. No erosive or destructive changes to suggest osteomyelitis. Paraspinal and other soft tissues: Negative. No evidence for deep soft tissue inflammatory process or fluid collection. Disc levels: No significant bony foraminal narrowing or canal stenosis. IMPRESSION: No bony findings of osteomyelitis. No evidence for deep soft tissue inflammatory process or fluid collection. Electronically Signed   By: Kristine Garbe M.D.   On: 06/10/2017 20:49   Dg Chest Port 1 View  Result Date: 06/11/2017 CLINICAL DATA:  51 year old female with productive cough, fever and lethargy EXAM: PORTABLE CHEST 1 VIEW COMPARISON:  Prior chest x-ray 06/09/2017 FINDINGS: The lungs are clear and negative for focal airspace consolidation, pulmonary edema or suspicious pulmonary nodule. No pleural effusion or pneumothorax. Cardiac and mediastinal contours are within normal limits. No acute fracture or lytic or blastic osseous lesions. The visualized upper abdominal bowel gas pattern is unremarkable. IMPRESSION: No active disease. Electronically Signed    By: Jacqulynn Cadet M.D.   On: 06/11/2017 09:05      Assessment & Plan: Pt is a 51 y.o. African American female with a PMHx of Morbid obesity, diabetes mellitus type 2, hypertension, obstructive sleep apnea, asthma, who was admitted to [facility] on 06/09/2017 for evaluation of back abscess.  1. Acute renal failure secondary to poor by mouth intake and NSAID use. The patient has been on NSAIDs including ibuprofen and naproxen. She is also had recent poor by mouth intake. For now continue IV fluid hydration. Obtain renal ultrasound to exclude obstruction. Urinalysis was unremarkable with the exception of glucosuria.  2.  Hypertension.  Agree with holding lisinopril  at this point in time.  3. Diabetes most type II without complication. Continue long and short acting insulin at this time.  4. Back abscess. Appreciate infectious disease consultation. Patient currently on meropenem and linezolid.

## 2017-06-11 NOTE — Progress Notes (Addendum)
[place] [facility] [place] NAME: Brenda Hicks    MR#:  [id number]  DATE OF BIRTH:  03/28/1966  SUBJECTIVE: Patient had high fever yesterday, found to be lethargic, rapid response was called. Patient's fever is a little bit down but still has 100.6 Fahrenheit this morning. She had nausea yesterday but she feels better today. CAT scan of the spine is essentially normal. No osteomyelitis noted.  no deep Infection. She has some cough and phlegm.   CHIEF COMPLAINT:   Chief Complaint  Patient presents with  . Recurrent Skin Infections    REVIEW OF SYSTEMS:   ROS CONSTITUTIONAL: has  fever,  No fatigue or weakness.  EYES: No blurred or double vision.  EARS, NOSE, AND THROAT: No tinnitus or ear pain.  RESPIRATORY: No cough, shortness of breath, wheezing or hemoptysis.  CARDIOVASCULAR: No chest pain, orthopnea, edema.  GASTROINTESTINAL: He had nausea yesterday. No vomiting or diarrhea.  GENITOURINARY: No dysuria, hematuria.  ENDOCRINE: No polyuria, nocturia,  HEMATOLOGY: No anemia, easy bruising or bleeding SKIN: No rash or lesion. MUSCULOSKELETAL:Pain in the back, tenderness around the draining area in the back.   NEUROLOGIC: No tingling, numbness, weakness.  PSYCHIATRY: No anxiety or depression.   DRUG ALLERGIES:  No Known Allergies  VITALS:  Blood pressure 115/65, pulse 92, temperature (!) 100.5 F (38.1 C), temperature source Oral, resp. rate 18, height 5\' 5"  (1.651 m), weight 127.2 kg (280 lb 6.4 oz), SpO2 91 %.  PHYSICAL EXAMINATION:  GENERAL:  51 y.o.-year-old patient lying in the bed with no acute distress.  EYES: Pupils equal, round, reactive to light and accommodation. No scleral icterus. Extraocular muscles intact.  HEENT: Head atraumatic, normocephalic. Oropharynx and nasopharynx clear.  NECK:  Supple, no jugular venous distention. No thyroid enlargement, no tenderness.  LUNGS: Normal breath sounds bilaterally, no wheezing,  rales,rhonchi or crepitation. No use of accessory muscles of respiration.  CARDIOVASCULAR: S1, S2 normal. No murmurs, rubs, or gallops.  ABDOMEN: Soft, nontender, nondistended. Bowel sounds present. No organomegaly or mass.  EXTREMITIES: No pedal edema, cyanosis, or clubbing.  NEUROLOGIC: Cranial nerves II through XII are intact. Muscle strength 5/5 in all extremities. Sensation intact. Gait not checked.  PSYCHIATRIC: The patient is alert and oriented x 3.  SKIN: No obvious rash, lesion, or ulcer.    LABORATORY PANEL:   CBC  Recent Labs Lab 06/10/17 0552  WBC 13.6*  HGB 12.4  HCT 37.6  PLT 220   ------------------------------------------------------------------------------------------------------------------  Chemistries   Recent Labs Lab 06/09/17 0800 06/10/17 0552 06/10/17 1413  NA 135 134*  --   K 4.0 4.2  --   CL 97* 99*  --   CO2 27 26  --   GLUCOSE 285* 213*  --   BUN 12 18  --   CREATININE 1.00 1.59* 2.35*  CALCIUM 8.9 8.5*  --   AST 22  --   --   ALT 21  --   --   ALKPHOS 106  --   --   BILITOT 0.7  --   --    ------------------------------------------------------------------------------------------------------------------  Cardiac Enzymes No results for input(s): TROPONINI in the last 168 hours. ------------------------------------------------------------------------------------------------------------------  RADIOLOGY:  Dg Chest 2 View  Result Date: 06/09/2017 CLINICAL DATA:  Sepsis with boil. EXAM: CHEST  2 VIEW COMPARISON:  05/04/2014 FINDINGS: There is no edema, consolidation, effusion, or pneumothorax. Mild linear atelectasis or scarring on the right. Normal heart size and mediastinal contours. IMPRESSION: Negative for pneumonia. Electronically  Signed   By: Monte Fantasia M.D.   On: 06/09/2017 10:13   Ct Thoracic Spine Wo Contrast  Result Date: 06/10/2017 CLINICAL DATA:  51 y/o  F; skin infection of the back with fever. EXAM: CT THORACIC SPINE  WITHOUT CONTRAST TECHNIQUE: Multidetector CT images of the thoracic were obtained using the standard protocol without intravenous contrast. COMPARISON:  None. FINDINGS: Alignment: Normal. Vertebrae: No acute fracture or focal pathologic process. No bony destructive or erosive change. Paraspinal and other soft tissues: Negative. No appreciable inflammation or fluid collection. Disc levels: Mild discogenic degenerative changes with small anterior marginal osteophytes. IMPRESSION: No bony findings of osteomyelitis. No deep soft tissue inflammatory change identified. Electronically Signed   By: Kristine Garbe M.D.   On: 06/10/2017 20:46   Ct Lumbar Spine Wo Contrast  Result Date: 06/10/2017 CLINICAL DATA:  51 y/o  F; skin infection of the back with fever. EXAM: CT LUMBAR SPINE WITHOUT CONTRAST TECHNIQUE: Multidetector CT imaging of the lumbar spine was performed without intravenous contrast administration. Multiplanar CT image reconstructions were also generated. COMPARISON:  None. FINDINGS: Segmentation: 5 lumbar type vertebrae. Alignment: Normal. Vertebrae: No acute fracture or focal pathologic process. No erosive or destructive changes to suggest osteomyelitis. Paraspinal and other soft tissues: Negative. No evidence for deep soft tissue inflammatory process or fluid collection. Disc levels: No significant bony foraminal narrowing or canal stenosis. IMPRESSION: No bony findings of osteomyelitis. No evidence for deep soft tissue inflammatory process or fluid collection. Electronically Signed   By: Kristine Garbe M.D.   On: 06/10/2017 20:49    EKG:   Orders placed or performed during the hospital encounter of 03/28/17  . EKG 12-Lead  . EKG 12-Lead  . EKG    ASSESSMENT AND PLAN:   1. Sepsis secondary to abscess in the back associated with cellulitis,back abscess drained in ER but cultures were not sent even though we asked ER to send it,micro does not have a sample for wound  cultures. 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 Vancomycin stop it because of worsening renal failure. Patient is on meropenem .add zyvox  High fevers, elevated lactic acid yesterday. Patient is on meropenem. ID consulted. Seen by wound care nurse yesterday, for back wound,more pus  Removed   .3. Acute kidney injury likely due to sepsis, vancomycin-induced: worsening renal failure: Consult nephrology,  Obtain renalultrasound.  Gentle hydration, hold vancomycin, follow renal function, hold lisinopril.  4.morbid obesity with the diabetes mellitus type 2; ,;  #5 hyperlipidemia: Continue statins. 6.Lethargy  last night likely secondary to sepsis and also narcotic induced: I stopped  her IV Dilaudid. She is alert, awake, oriented. Told her that she can take Percocet for pain control.   Discussed with patient's nurse.   All the records are reviewed and case discussed with Care Management/Social Workerr. Management plans discussed with the patient, family and they are in agreement.  CODE STATUS:full  TOTAL TIME TAKING CARE OF THIS PATIENT: 35 minutes.   POSSIBLE D/C IN 1 DAYS, DEPENDING ON CLINICAL CONDITION.   Epifanio Lesches M.D on 06/11/2017 at 7:45 AM  Between 7am to 6pm - Pager - [phone]  After 6pm go to [email or web address] - password [email or web address] [facility]  [facility]  Office  [phone]  CC: Primary care physician; [place], [facility]   Note: This dictation was prepared with Dragon dictation  along with smaller phrase technology. Any transcriptional errors that result from this process are unintentional.

## 2017-06-11 NOTE — Plan of Care (Signed)
Problem: Safety: Goal: Ability to remain free from injury will improve Outcome: Progressing Patient knows her physical limitations. Knows how and when to ask for assistance. Kept safe and comfortable throughout shift.   Problem: Skin Integrity: Goal: Risk for impaired skin integrity will decrease Outcome: Progressing Dressing to mid back done.   Problem: Tissue Perfusion: Goal: Risk factors for ineffective tissue perfusion will decrease Outcome: Progressing SCDs working to [employer]. Increase mobility encouraged. Needs attended.

## 2017-06-11 NOTE — Significant Event (Signed)
Rapid Response Event Note  Overview: Time Called: 1913 Arrival Time: 1915 Event Type: Neurologic  Initial Focused Assessment: Pt. In room with care nurse, lethargic but responsive to voice- A&O x 4, VSS.  Pt. Care nurse discussing situation with nursing supervisor, Kennyth Lose. Pt. Had been experiencing pain and had received Dilaudid PRN as well as an earlier Norco PRN.   Interventions:  Performed focused Neuro assessment, assessed vital signs, examined lab work and chart.  Plan of Care (if not transferred): Instructed pt. Care nurse from day and oncoming night shift to call back if vital signs or neuro status declines, as well as any other concerns.  Event Summary:   at      at    Outcome: Stayed in room and stabalized  Event End Time: [place]

## 2017-06-11 NOTE — Progress Notes (Signed)
Pharmacy Note - Anticoagulation  Patient with orders for enoxaparin 40mg  SQ Q12H for VTE prophylaxis. Now with AKI  Estimated Creatinine Clearance: 26.9 mL/min (A) (by C-G formula based on SCr of 3.4 mg/dL (H)). Body mass index is 46.66 kg/m.  Will adjust to enoxaparin 40mg  SQ Q24H for BMI > 40 with CrCl <59ml/min  Will transition to heparin SQ if CrCl drops < 52ml/min  Rexene Edison, PharmD, BCPS Clinical Pharmacist  06/11/2017 10:01 AM

## 2017-06-11 NOTE — Progress Notes (Signed)
ID E note Reviewed chart.  57 wo with morbid obesity DM admitted with back abscess.  On admit temp 99.8 WBC 13.   In ED I and D performed. Large amount of purulent drainage obtained. Packed with gauze.  No culture done that I can see. [place] done.  Started vanco and zosyn. Had high spiking fever to 103. Cr increased from 1-> 2.35->3.4 Abx were changed to meropenem and linezolid  CT T and L spine neg.    Rec Please culture drainage from back abscess. This can help guide therapy instead of having to use broad spectrum treatment. I have ordered.  Continue to monitor fever curve, wbc and renal function.  Would not dc until culture returns from back. Continue linezolid and meropenem for now.  Please remember that if pt is having significant back pain, a CT scan does not rule out discitis or osteomyelitis and would require an MRI for this. However if clinically improving and is not bacteremia does not necessarily need MRI and would try to avoid it given cr

## 2017-06-11 NOTE — Progress Notes (Signed)
Renal Dose Adjustment  Patient with orders for gabapentin 300mg  PO TID continued from home.  Currently with AKI SCr: 3.4, CrCl: 27  Adjusted gabapentin to 300mg  PO QHS. Will continue to monitor renal function  Rexene Edison, PharmD, BCPS Clinical Pharmacist  06/11/2017 3:16 PM

## 2017-06-12 LAB — GLUCOSE, CAPILLARY
GLUCOSE-CAPILLARY: 126 mg/dL — AB (ref 65–99)
GLUCOSE-CAPILLARY: 229 mg/dL — AB (ref 65–99)
Glucose-Capillary: 116 mg/dL — ABNORMAL HIGH (ref 65–99)
Glucose-Capillary: 197 mg/dL — ABNORMAL HIGH (ref 65–99)

## 2017-06-12 LAB — CREATININE, SERUM
CREATININE: 3.56 mg/dL — AB (ref 0.44–1.00)
GFR calc Af Amer: 16 mL/min — ABNORMAL LOW (ref >60)
GFR, EST NON AFRICAN AMERICAN: 14 mL/min — AB (ref >60)

## 2017-06-12 MED ORDER — HEPARIN SODIUM (PORCINE) 5000 UNIT/ML IJ SOLN
5000.0000 [IU] | Freq: Three times a day (TID) | INTRAMUSCULAR | Status: DC
  Administered 2017-06-12 – 2017-06-15 (×10): 5000 [IU] via SUBCUTANEOUS
  Filled 2017-06-12 (×10): qty 1

## 2017-06-12 NOTE — Progress Notes (Signed)
Yellow wrist band in place.

## 2017-06-12 NOTE — Progress Notes (Signed)
[facility]  ROUNDING NOTE   Subjective:  Renal function Slightly worse today, creatinine up to 3.5. Urine output was 1.1 L over the preceding 24 hours.   Objective:  Vital signs in last 24 hours:  Temp:  [98.5 F (36.9 C)-99.9 F (37.7 C)] 98.7 F (37.1 C) (07/22 1240) Pulse Rate:  [94-103] 94 (07/22 1240) Resp:  [19-20] 20 (07/22 1240) BP: (108-143)/(62-80) 108/62 (07/22 1240) SpO2:  [88 %-98 %] 97 % (07/22 1240) Weight:  [152.8 kg (336 lb 12.8 oz)] 152.8 kg (336 lb 12.8 oz) (07/22 0500)  Weight change: 25.6 kg (56 lb 6.4 oz) Filed Weights   06/09/17 0742 06/11/17 0500 06/12/17 0500  Weight: 127 kg (280 lb) 127.2 kg (280 lb 6.4 oz) (!) 152.8 kg (336 lb 12.8 oz)    Intake/Output: I/O last 3 completed shifts: In: 4870.4 [P.O.:1080; I.V.:3040.4; IV Piggyback:750] Out: 1100 [Urine:1100]   Intake/Output this shift:  Total I/O In: 600 [P.O.:600] Out: -   Physical Exam: General: No acute distress, morbidly obese  Head: Normocephalic, atraumatic. Moist oral mucosal membranes  Eyes: Anicteric  Neck: Supple, trachea midline  Lungs:  Clear to auscultation, normal effort  Heart: S1S2 no rubs  Abdomen:  Soft, nontender, bowel sounds present  Extremities: No peripheral edema.  Neurologic: Awake, alert, following commands  Skin: No lesions       Basic Metabolic Panel:  Recent Labs Lab 06/09/17 0800 06/10/17 0552 06/10/17 1413 06/11/17 0643 06/12/17 0505  NA 135 134*  --  134*  --   K 4.0 4.2  --  4.2  --   CL 97* 99*  --  98*  --   CO2 27 26  --  27  --   GLUCOSE 285* 213*  --  167*  --   BUN 12 18  --  27*  --   CREATININE 1.00 1.59* 2.35* 3.40* 3.56*  CALCIUM 8.9 8.5*  --  8.0*  --     Liver Function Tests:  Recent Labs Lab 06/09/17 0800  AST 22  ALT 21  ALKPHOS 106  BILITOT 0.7  PROT 8.0  ALBUMIN 3.7   No results for input(s): LIPASE, AMYLASE in the last 168 hours. No results for input(s): AMMONIA in the last 168  hours.  CBC:  Recent Labs Lab 06/09/17 0800 06/10/17 0552  WBC 13.0* 13.6*  HGB 13.7 12.4  HCT 41.9 37.6  MCV 91.0 91.5  PLT 274 220    Cardiac Enzymes: No results for input(s): CKTOTAL, CKMB, CKMBINDEX, TROPONINI in the last 168 hours.  BNP: Invalid input(s): POCBNP  CBG:  Recent Labs Lab 06/11/17 1249 06/11/17 1830 06/11/17 2041 06/12/17 0735 06/12/17 1154  GLUCAP 149* 143* 153* 116* 126*    Microbiology: Results for orders placed or performed during the hospital encounter of 06/09/17  Blood Culture (routine x 2)     Status: None (Preliminary result)   Collection Time: 06/09/17  8:00 AM  Result Value Ref Range Status   Specimen Description BLOOD LEFT HAND  Final   Special Requests   Final    BOTTLES DRAWN AEROBIC AND ANAEROBIC Blood Culture adequate volume   Culture NO GROWTH 3 DAYS  Final   Report Status PENDING  Incomplete  Blood Culture (routine x 2)     Status: None (Preliminary result)   Collection Time: 06/09/17  8:39 AM  Result Value Ref Range Status   Specimen Description BLOOD LEFT [facility]  Final   Special Requests   Final  BOTTLES DRAWN AEROBIC AND ANAEROBIC Blood Culture adequate volume   Culture NO GROWTH 3 DAYS  Final   Report Status PENDING  Incomplete  Urine culture     Status: Abnormal   Collection Time: 06/09/17  8:41 AM  Result Value Ref Range Status   Specimen Description URINE, RANDOM  Final   Special Requests NONE  Final   Culture MULTIPLE SPECIES PRESENT, SUGGEST RECOLLECTION (A)  Final   Report Status 06/10/2017 FINAL  Final  CULTURE, BLOOD (ROUTINE X 2) w Reflex to ID Panel     Status: None (Preliminary result)   Collection Time: 06/10/17  6:09 PM  Result Value Ref Range Status   Specimen Description BLOOD BLOOD RIGHT ARM  Final   Special Requests   Final    BOTTLES DRAWN AEROBIC AND ANAEROBIC Blood Culture adequate volume   Culture NO GROWTH 2 DAYS  Final   Report Status PENDING  Incomplete  CULTURE, BLOOD (ROUTINE X 2) w  Reflex to ID Panel     Status: None (Preliminary result)   Collection Time: 06/10/17  9:37 PM  Result Value Ref Range Status   Specimen Description BLOOD LEFT HAND  Final   Special Requests   Final    BOTTLES DRAWN AEROBIC AND ANAEROBIC Blood Culture adequate volume   Culture NO GROWTH 2 DAYS  Final   Report Status PENDING  Incomplete  Aerobic Culture (superficial specimen)     Status: None (Preliminary result)   Collection Time: 06/11/17  4:28 PM  Result Value Ref Range Status   Specimen Description BACK  Final   Special Requests Normal  Final   Gram Stain   Final    FEW WBC PRESENT, PREDOMINANTLY PMN RARE GRAM POSITIVE COCCI IN CLUSTERS    Culture   Final    FEW STAPHYLOCOCCUS AUREUS CULTURE REINCUBATED FOR BETTER GROWTH Performed at [facility], [place] [address] [place], [place] [address]    Report Status PENDING  Incomplete    Coagulation Studies: No results for input(s): LABPROT, INR in the last 72 hours.  Urinalysis: No results for input(s): COLORURINE, LABSPEC, PHURINE, GLUCOSEU, HGBUR, BILIRUBINUR, KETONESUR, PROTEINUR, UROBILINOGEN, NITRITE, LEUKOCYTESUR in the last 72 hours.  Invalid input(s): APPERANCEUR    Imaging: Ct Thoracic Spine Wo Contrast  Result Date: 06/10/2017 CLINICAL DATA:  51 y/o  F; skin infection of the back with fever. EXAM: CT THORACIC SPINE WITHOUT CONTRAST TECHNIQUE: Multidetector CT images of the thoracic were obtained using the standard protocol without intravenous contrast. COMPARISON:  None. FINDINGS: Alignment: Normal. Vertebrae: No acute fracture or focal pathologic process. No bony destructive or erosive change. Paraspinal and other soft tissues: Negative. No appreciable inflammation or fluid collection. Disc levels: Mild discogenic degenerative changes with small anterior marginal osteophytes. IMPRESSION: No bony findings of osteomyelitis. No deep soft tissue inflammatory change identified. Electronically Signed   By: Kristine Garbe M.D.   On: 06/10/2017 20:46   Ct Lumbar Spine Wo Contrast  Result Date: 06/10/2017 CLINICAL DATA:  51 y/o  F; skin infection of the back with fever. EXAM: CT LUMBAR SPINE WITHOUT CONTRAST TECHNIQUE: Multidetector CT imaging of the lumbar spine was performed without intravenous contrast administration. Multiplanar CT image reconstructions were also generated. COMPARISON:  None. FINDINGS: Segmentation: 5 lumbar type vertebrae. Alignment: Normal. Vertebrae: No acute fracture or focal pathologic process. No erosive or destructive changes to suggest osteomyelitis. Paraspinal and other soft tissues: Negative. No evidence for deep soft tissue inflammatory process or fluid collection. Disc levels: No  significant bony foraminal narrowing or canal stenosis. IMPRESSION: No bony findings of osteomyelitis. No evidence for deep soft tissue inflammatory process or fluid collection. Electronically Signed   By: Kristine Garbe M.D.   On: 06/10/2017 20:49   US Renal  Result Date: 06/11/2017 CLINICAL DATA:  Renal failure EXAM: RENAL / URINARY TRACT ULTRASOUND COMPLETE COMPARISON:  CT abdomen pelvis 03/29/2017 FINDINGS: Right Kidney: Length: 12.1 cm. Echogenicity within normal limits. No mass or hydronephrosis visualized. Left Kidney: Length: 13.6 cm. Echogenicity within normal limits. No mass or hydronephrosis visualized. Bladder: Appears normal for degree of bladder distention. IMPRESSION: Negative renal ultrasound Electronically Signed   By: Franchot Gallo M.D.   On: 06/11/2017 13:36   Dg Chest Port 1 View  Result Date: 06/11/2017 CLINICAL DATA:  51 year old female with productive cough, fever and lethargy EXAM: PORTABLE CHEST 1 VIEW COMPARISON:  Prior chest x-ray 06/09/2017 FINDINGS: The lungs are clear and negative for focal airspace consolidation, pulmonary edema or suspicious pulmonary nodule. No pleural effusion or pneumothorax. Cardiac and mediastinal contours are within normal limits. No  acute fracture or lytic or blastic osseous lesions. The visualized upper abdominal bowel gas pattern is unremarkable. IMPRESSION: No active disease. Electronically Signed   By: Jacqulynn Cadet M.D.   On: 06/11/2017 09:05     Medications:   . sodium chloride 100 mL/hr at 06/12/17 0827  . linezolid (ZYVOX) IV Stopped (06/12/17 1115)  . meropenem Stopped (06/12/17 0930)   . aspirin EC  81 mg Oral Daily  . atorvastatin  40 mg Oral Daily  . docusate sodium  100 mg Oral BID  . gabapentin  300 mg Oral QHS  . heparin subcutaneous  5,000 Units Subcutaneous Q8H  . insulin aspart  0-5 Units Subcutaneous QHS  . insulin aspart  0-9 Units Subcutaneous TID WC  . insulin aspart  3 Units Subcutaneous TID WC  . insulin glargine  70 Units Subcutaneous QHS   acetaminophen **OR** acetaminophen, albuterol, bisacodyl, HYDROcodone-acetaminophen, ondansetron **OR** ondansetron (ZOFRAN) IV, orphenadrine, oxyCODONE, promethazine  Assessment/ Plan:  51 y.o. African [place] female with a PMHx of Morbid obesity, diabetes mellitus type 2, hypertension, obstructive sleep apnea, asthma, who was admitted to [facility] on 06/09/2017 for evaluation of back abscess.  1. Acute renal failure secondary to poor by mouth intake and NSAID use. The patient has been on NSAIDs including ibuprofen and naproxen. She also had recent poor by mouth intake.  - Renal function slightly worse. Creatinine up to 3.56 at the moment.  Continue IV fluid hydration for now.  Follow renal function in the a.m.  2.  Hypertension.  Continue to hold lisinopril.  3. Diabetes mellitus type II without complication. Continue long and short acting insulin at this time.  4. Back abscess.  Patient remains on linezolid and meropenem.   LOS: 3 Shalev Helminiak 7/22/20182:05 PM

## 2017-06-12 NOTE — Progress Notes (Signed)
[place] [facility] [place] NAME: Brenda Hicks    MR#:  [id number]  DATE OF BIRTH:  [birth year]  SUBJECTIVE: Seen today, no further fever. Creatinine continued to rise. Ultrasound of kidneys normal. No shortness of breath or cough. Patient says that she's feels really weak. Nausea resolved. Decreased back pain.  CHIEF COMPLAINT:   Chief Complaint  Patient presents with  . Recurrent Skin Infections    REVIEW OF SYSTEMS:   ROS CONSTITUTIONAL: weakness   EYES: No blurred or double vision.  EARS, NOSE, AND THROAT: No tinnitus or ear pain.  RESPIRATORY: No cough, shortness of breath, wheezing or hemoptysis.  CARDIOVASCULAR: No chest pain, orthopnea, edema.  GASTROINTESTINAL: He had nausea yesterday. No vomiting or diarrhea.  GENITOURINARY: No dysuria, hematuria.  ENDOCRINE: No polyuria, nocturia,  HEMATOLOGY: No anemia, easy bruising or bleeding SKIN: No rash or lesion. MUSCULOSKELETAL:Pain in the back, tenderness around the draining area in the back.   NEUROLOGIC: No tingling, numbness, weakness.  PSYCHIATRY: No anxiety or depression.   DRUG ALLERGIES:  No Known Allergies  VITALS:  Blood pressure (!) 141/80, pulse (!) 103, temperature 98.5 F (36.9 C), temperature source Oral, resp. rate 19, height 5\' 5"  (1.651 m), weight (!) 152.8 kg (336 lb 12.8 oz), SpO2 98 %.  PHYSICAL EXAMINATION:  GENERAL:  51 y.o.-year-old patient lying in the bed with no acute distress.  EYES: Pupils equal, round, reactive to light and accommodation. No scleral icterus. Extraocular muscles intact.  HEENT: Head atraumatic, normocephalic. Oropharynx and nasopharynx clear.  NECK:  Supple, no jugular venous distention. No thyroid enlargement, no tenderness.  LUNGS: Normal breath sounds bilaterally, no wheezing, rales,rhonchi or crepitation. No use of accessory muscles of respiration.  CARDIOVASCULAR: S1, S2 normal. No murmurs, rubs, or gallops.  ABDOMEN: Soft,  nontender, nondistended. Bowel sounds present. No organomegaly or mass.  EXTREMITIES: No pedal edema, cyanosis, or clubbing.  NEUROLOGIC: Cranial nerves II through XII are intact. Muscle strength 5/5 in all extremities. Sensation intact. Gait not checked.  PSYCHIATRIC: The patient is alert and oriented x 3.  SKIN: No obvious rash, lesion, or ulcer.    LABORATORY PANEL:   CBC  Recent Labs Lab 06/10/17 0552  WBC 13.6*  HGB 12.4  HCT 37.6  PLT 220   ------------------------------------------------------------------------------------------------------------------  Chemistries   Recent Labs Lab 06/09/17 0800  06/11/17 0643 06/12/17 0505  NA 135  < > 134*  --   K 4.0  < > 4.2  --   CL 97*  < > 98*  --   CO2 27  < > 27  --   GLUCOSE 285*  < > 167*  --   BUN 12  < > 27*  --   CREATININE 1.00  < > 3.40* 3.56*  CALCIUM 8.9  < > 8.0*  --   AST 22  --   --   --   ALT 21  --   --   --   ALKPHOS 106  --   --   --   BILITOT 0.7  --   --   --   < > = values in this interval not displayed. ------------------------------------------------------------------------------------------------------------------  Cardiac Enzymes No results for input(s): TROPONINI in the last 168 hours. ------------------------------------------------------------------------------------------------------------------  RADIOLOGY:  Ct Thoracic Spine Wo Contrast  Result Date: 06/10/2017 CLINICAL DATA:  51 y/o  F; skin infection of the back with fever. EXAM: CT THORACIC SPINE WITHOUT CONTRAST TECHNIQUE: Multidetector CT images of the thoracic  were obtained using the standard protocol without intravenous contrast. COMPARISON:  None. FINDINGS: Alignment: Normal. Vertebrae: No acute fracture or focal pathologic process. No bony destructive or erosive change. Paraspinal and other soft tissues: Negative. No appreciable inflammation or fluid collection. Disc levels: Mild discogenic degenerative changes with small anterior  marginal osteophytes. IMPRESSION: No bony findings of osteomyelitis. No deep soft tissue inflammatory change identified. Electronically Signed   By: Kristine Garbe M.D.   On: 06/10/2017 20:46   Ct Lumbar Spine Wo Contrast  Result Date: 06/10/2017 CLINICAL DATA:  51 y/o  F; skin infection of the back with fever. EXAM: CT LUMBAR SPINE WITHOUT CONTRAST TECHNIQUE: Multidetector CT imaging of the lumbar spine was performed without intravenous contrast administration. Multiplanar CT image reconstructions were also generated. COMPARISON:  None. FINDINGS: Segmentation: 5 lumbar type vertebrae. Alignment: Normal. Vertebrae: No acute fracture or focal pathologic process. No erosive or destructive changes to suggest osteomyelitis. Paraspinal and other soft tissues: Negative. No evidence for deep soft tissue inflammatory process or fluid collection. Disc levels: No significant bony foraminal narrowing or canal stenosis. IMPRESSION: No bony findings of osteomyelitis. No evidence for deep soft tissue inflammatory process or fluid collection. Electronically Signed   By: Kristine Garbe M.D.   On: 06/10/2017 20:49   US Renal  Result Date: 06/11/2017 CLINICAL DATA:  Renal failure EXAM: RENAL / URINARY TRACT ULTRASOUND COMPLETE COMPARISON:  CT abdomen pelvis 03/29/2017 FINDINGS: Right Kidney: Length: 12.1 cm. Echogenicity within normal limits. No mass or hydronephrosis visualized. Left Kidney: Length: 13.6 cm. Echogenicity within normal limits. No mass or hydronephrosis visualized. Bladder: Appears normal for degree of bladder distention. IMPRESSION: Negative renal ultrasound Electronically Signed   By: Franchot Gallo M.D.   On: 06/11/2017 13:36   Dg Chest Port 1 View  Result Date: 06/11/2017 CLINICAL DATA:  51 year old female with productive cough, fever and lethargy EXAM: PORTABLE CHEST 1 VIEW COMPARISON:  Prior chest x-ray 06/09/2017 FINDINGS: The lungs are clear and negative for focal airspace  consolidation, pulmonary edema or suspicious pulmonary nodule. No pleural effusion or pneumothorax. Cardiac and mediastinal contours are within normal limits. No acute fracture or lytic or blastic osseous lesions. The visualized upper abdominal bowel gas pattern is unremarkable. IMPRESSION: No active disease. Electronically Signed   By: Jacqulynn Cadet M.D.   On: 06/11/2017 09:05    EKG:   Orders placed or performed during the hospital encounter of 03/28/17  . EKG 12-Lead  . EKG 12-Lead  . EKG    ASSESSMENT AND PLAN:   1. Sepsis secondary to abscess in the back associated with cellulitis,back abscess drained in ER /Continue Zyvox, meropenem. Cultures done from wound yesterday. Seen by ID. And back pain improved.  .3. Acute kidney injury likely due to sepsis,, NSAID induced vancomycin-induced: worsening renal failure: Consulted nephrology,  Renal  unremarkable. Continue hydration, monitor kidney function, avoid nephrotoxic agents  4.morbid obesity with the diabetes mellitus type 2; ,;  #5 hyperlipidemia: Continue statins. 6.Lethargy  'improved. #7 generalized weakness secondary to sepsis, renal failure. #8 diabetes mellitus type 2; controlled blood sugars.; Continue Lantus 70 untis at night,SSI with coverage Discussed with patient's nurse.   All the records are reviewed and case discussed with Care Management/Social Workerr. Management plans discussed with the patient, family and they are in agreement.  CODE STATUS:full  TOTAL TIME TAKING CARE OF THIS PATIENT: 35 minutes.   POSSIBLE D/C IN 1 DAYS, DEPENDING ON CLINICAL CONDITION.   Epifanio Lesches M.D on 06/12/2017 at 8:32 AM  Between 7am  to 6pm - Pager - [phone]  After 6pm go to [email or web address] - password [email or web address] [facility]  Office  [phone]  CC: Primary care physician; [place], [facility]   Note: This dictation was prepared with Dragon dictation along with smaller phrase  technology. Any transcriptional errors that result from this process are unintentional.

## 2017-06-12 NOTE — Progress Notes (Signed)
Anticoagulation Note  51 y/o F on Lovenox 40 mg daily for DVT prophylaxis with AKI.   Lab Results  Component Value Date   CREATININE 3.56 (H) 06/12/2017   CREATININE 3.40 (H) 06/11/2017   CREATININE 2.35 (H) 06/10/2017   Will convert patient to [facility].   Ulice Dash, PharmD Clinical Pharmacist

## 2017-06-13 LAB — CBC
HCT: 32.5 % — ABNORMAL LOW (ref 35.0–47.0)
HEMOGLOBIN: 10.9 g/dL — AB (ref 12.0–16.0)
MCH: 31.2 pg (ref 26.0–34.0)
MCHC: 33.5 g/dL (ref 32.0–36.0)
MCV: 93.2 fL (ref 80.0–100.0)
Platelets: 236 10*3/uL (ref 150–440)
RBC: 3.49 MIL/uL — AB (ref 3.80–5.20)
RDW: 14.4 % (ref 11.5–14.5)
WBC: 10.1 10*3/uL (ref 3.6–11.0)

## 2017-06-13 LAB — BASIC METABOLIC PANEL
Anion gap: 8 (ref 5–15)
BUN: 28 mg/dL — AB (ref 6–20)
CALCIUM: 8.2 mg/dL — AB (ref 8.9–10.3)
CHLORIDE: 104 mmol/L (ref 101–111)
CO2: 27 mmol/L (ref 22–32)
CREATININE: 2.96 mg/dL — AB (ref 0.44–1.00)
GFR calc Af Amer: 20 mL/min — ABNORMAL LOW (ref >60)
GFR calc non Af Amer: 18 mL/min — ABNORMAL LOW (ref >60)
Glucose, Bld: 118 mg/dL — ABNORMAL HIGH (ref 65–99)
Potassium: 4.3 mmol/L (ref 3.5–5.1)
SODIUM: 139 mmol/L (ref 135–145)

## 2017-06-13 LAB — GLUCOSE, CAPILLARY
GLUCOSE-CAPILLARY: 118 mg/dL — AB (ref 65–99)
Glucose-Capillary: 134 mg/dL — ABNORMAL HIGH (ref 65–99)
Glucose-Capillary: 79 mg/dL (ref 65–99)
Glucose-Capillary: 213 mg/dL — ABNORMAL HIGH (ref 65–99)

## 2017-06-13 MED ORDER — SODIUM CHLORIDE 0.9% FLUSH: 10.0000 mL | INTRAVENOUS | Status: DC | PRN

## 2017-06-13 MED ORDER — LINEZOLID 600 MG/300ML IV SOLN
600.0000 mg | Freq: Two times a day (BID) | INTRAVENOUS | Status: DC
  Administered 2017-06-13 – 2017-06-14 (×3): 600 mg via INTRAVENOUS
  Filled 2017-06-13 (×5): qty 300

## 2017-06-13 MED ORDER — SODIUM CHLORIDE 0.9 % IV SOLN
1.0000 g | Freq: Two times a day (BID) | INTRAVENOUS | Status: DC
  Administered 2017-06-13 – 2017-06-14 (×3): 1 g via INTRAVENOUS
  Filled 2017-06-13 (×4): qty 1

## 2017-06-13 NOTE — Progress Notes (Signed)
Pharmacy Antibiotic Note  Brenda Hicks is a 51 y.o. female with diabetes admitted on 06/09/2017 with abscess/cellultis of back abscess.  Pharmacy consulted to dose antibiotics.  Patient now with AKI  I&D in ED-Large amount of purulent drainage obtained. No Cx.   ID Following  Plan: Discussed with MD will transition from zosyn to meropenem due to nephrotoxicity in conjunction with vancomycin. Meropenem 1gm IV Q12H (dose per renal fxn)  Patient also transitioned from Vancomycin to Linezolid 600 mg IV q12h at this time.    Height: 5\' 5"  (165.1 cm) Weight: (!) 343 lb 3.2 oz (155.7 kg) IBW/kg (Calculated) : 57  Temp (24hrs), Avg:98.9 F (37.2 C), Min:98.7 F (37.1 C), Max:99.1 F (37.3 C)   Recent Labs Lab 06/09/17 0800 06/09/17 1131 06/10/17 0552 06/10/17 1010 06/10/17 1413 06/10/17 1809 06/10/17 2137 06/11/17 0038 06/11/17 0643 06/12/17 0505 06/13/17 0534  WBC 13.0*  --  13.6*  --   --   --   --   --   --   --  10.1  CREATININE 1.00  --  1.59*  --  2.35*  --   --   --  3.40* 3.56* 2.96*  LATICACIDVEN 1.8 1.7  --   --   --  1.2 2.3* 2.1*  --   --   --   VANCORANDOM  --   --   --  20  --   --   --   --  13  --   --     Estimated Creatinine Clearance: 35 mL/min (A) (by C-G formula based on SCr of 2.96 mg/dL (H)).    No Known Allergies  Antimicrobials this admission: Vancomycin 7/19 >> Zosyn 7/19 >> 7/20 Meropenem 7/20 >> Linezolid >> 7/21 >>  Dose adjustments this admission: Vancomycin random: 57mcg/ml,  7 hr after dose  Microbiology results: 7/19 Urine: multiple species 7/19 Blood x2: NGTD 7/20 Bcx: NGTD 7/21: Wound cx (back): Staph Aureus- reincubated  Thank you for allowing pharmacy to be a part of this patient's care.  Katena Petitjean A 06/13/2017 9:49 AM

## 2017-06-13 NOTE — Progress Notes (Signed)
[facility]  ROUNDING NOTE   Subjective:   UOP 1100 Creatinine 2.96 (3.56)  NS at 127mL/hr  linezolid and meropenem  Objective:  Vital signs in last 24 hours:  Temp:  [98.8 F (37.1 C)-99.1 F (37.3 C)] 98.8 F (37.1 C) (07/23 1318) Pulse Rate:  [81-88] 88 (07/23 1318) Resp:  [20] 20 (07/23 1318) BP: (126-148)/(71-82) 148/82 (07/23 1318) SpO2:  [99 %-100 %] 100 % (07/23 1318) Weight:  [155.7 kg (343 lb 3.2 oz)] 155.7 kg (343 lb 3.2 oz) (07/23 0500)  Weight change: 2.903 kg (6 lb 6.4 oz) Filed Weights   06/11/17 0500 06/12/17 0500 06/13/17 0500  Weight: 127.2 kg (280 lb 6.4 oz) (!) 152.8 kg (336 lb 12.8 oz) (!) 155.7 kg (343 lb 3.2 oz)    Intake/Output: I/O last 3 completed shifts: In: 7808 [P.O.:1200; I.V.:5208; IV Piggyback:1400] Out: 1100 [Urine:1100]   Intake/Output this shift:  Total I/O In: 240 [P.O.:240] Out: -   Physical Exam: General: No acute distress, morbidly obese  Head: Normocephalic, atraumatic. Moist oral mucosal membranes  Eyes: Anicteric  Neck: Supple, trachea midline  Lungs:  Clear to auscultation, normal effort  Heart: S1S2 no rubs  Abdomen:  Soft, nontender, bowel sounds present  Extremities: No peripheral edema.  Neurologic: Awake, alert, following commands  Skin: Left scapular lesion with dressings.        Basic Metabolic Panel:  Recent Labs Lab 06/09/17 0800 06/10/17 0552 06/10/17 1413 06/11/17 0643 06/12/17 0505 06/13/17 0534  NA 135 134*  --  134*  --  139  K 4.0 4.2  --  4.2  --  4.3  CL 97* 99*  --  98*  --  104  CO2 27 26  --  27  --  27  GLUCOSE 285* 213*  --  167*  --  118*  BUN 12 18  --  27*  --  28*  CREATININE 1.00 1.59* 2.35* 3.40* 3.56* 2.96*  CALCIUM 8.9 8.5*  --  8.0*  --  8.2*    Liver Function Tests:  Recent Labs Lab 06/09/17 0800  AST 22  ALT 21  ALKPHOS 106  BILITOT 0.7  PROT 8.0  ALBUMIN 3.7   No results for input(s): LIPASE, AMYLASE in the last 168 hours. No results for  input(s): AMMONIA in the last 168 hours.  CBC:  Recent Labs Lab 06/09/17 0800 06/10/17 0552 06/13/17 0534  WBC 13.0* 13.6* 10.1  HGB 13.7 12.4 10.9*  HCT 41.9 37.6 32.5*  MCV 91.0 91.5 93.2  PLT 274 220 236    Cardiac Enzymes: No results for input(s): CKTOTAL, CKMB, CKMBINDEX, TROPONINI in the last 168 hours.  BNP: Invalid input(s): POCBNP  CBG:  Recent Labs Lab 06/12/17 1154 06/12/17 1654 06/12/17 1955 06/13/17 0737 06/13/17 1201  GLUCAP 126* 229* 197* 118* 134*    Microbiology: Results for orders placed or performed during the hospital encounter of 06/09/17  Blood Culture (routine x 2)     Status: None (Preliminary result)   Collection Time: 06/09/17  8:00 AM  Result Value Ref Range Status   Specimen Description BLOOD LEFT HAND  Final   Special Requests   Final    BOTTLES DRAWN AEROBIC AND ANAEROBIC Blood Culture adequate volume   Culture NO GROWTH 4 DAYS  Final   Report Status PENDING  Incomplete  Blood Culture (routine x 2)     Status: None (Preliminary result)   Collection Time: 06/09/17  8:39 AM  Result Value Ref Range Status  Specimen Description BLOOD LEFT AC  Final   Special Requests   Final    BOTTLES DRAWN AEROBIC AND ANAEROBIC Blood Culture adequate volume   Culture NO GROWTH 4 DAYS  Final   Report Status PENDING  Incomplete  Urine culture     Status: Abnormal   Collection Time: 06/09/17  8:41 AM  Result Value Ref Range Status   Specimen Description URINE, RANDOM  Final   Special Requests NONE  Final   Culture MULTIPLE SPECIES PRESENT, SUGGEST RECOLLECTION (A)  Final   Report Status 06/10/2017 FINAL  Final  CULTURE, BLOOD (ROUTINE X 2) w Reflex to ID Panel     Status: None (Preliminary result)   Collection Time: 06/10/17  6:09 PM  Result Value Ref Range Status   Specimen Description BLOOD BLOOD RIGHT ARM  Final   Special Requests   Final    BOTTLES DRAWN AEROBIC AND ANAEROBIC Blood Culture adequate volume   Culture NO GROWTH 3 DAYS  Final    Report Status PENDING  Incomplete  CULTURE, BLOOD (ROUTINE X 2) w Reflex to ID Panel     Status: None (Preliminary result)   Collection Time: 06/10/17  9:37 PM  Result Value Ref Range Status   Specimen Description BLOOD LEFT HAND  Final   Special Requests   Final    BOTTLES DRAWN AEROBIC AND ANAEROBIC Blood Culture adequate volume   Culture NO GROWTH 3 DAYS  Final   Report Status PENDING  Incomplete  Aerobic Culture (superficial specimen)     Status: None (Preliminary result)   Collection Time: 06/11/17  4:28 PM  Result Value Ref Range Status   Specimen Description BACK  Final   Special Requests Normal  Final   Gram Stain   Final    FEW WBC PRESENT, PREDOMINANTLY PMN RARE GRAM POSITIVE COCCI IN CLUSTERS    Culture   Final    FEW STAPHYLOCOCCUS AUREUS CULTURE REINCUBATED FOR BETTER GROWTH Performed at [facility], [place] [address], [place], [place] [address]    Report Status PENDING  Incomplete    Coagulation Studies: No results for input(s): LABPROT, INR in the last 72 hours.  Urinalysis: No results for input(s): COLORURINE, LABSPEC, PHURINE, GLUCOSEU, HGBUR, BILIRUBINUR, KETONESUR, PROTEINUR, UROBILINOGEN, NITRITE, LEUKOCYTESUR in the last 72 hours.  Invalid input(s): APPERANCEUR    Imaging: No results found.   Medications:   . sodium chloride 100 mL/hr at 06/13/17 0527  . linezolid (ZYVOX) IV    . meropenem (MERREM) 1 GM IVPB     . aspirin EC  81 mg Oral Daily  . atorvastatin  40 mg Oral Daily  . docusate sodium  100 mg Oral BID  . gabapentin  300 mg Oral QHS  . heparin subcutaneous  5,000 Units Subcutaneous Q8H  . insulin aspart  0-5 Units Subcutaneous QHS  . insulin aspart  0-9 Units Subcutaneous TID WC  . insulin aspart  3 Units Subcutaneous TID WC  . insulin glargine  70 Units Subcutaneous QHS   acetaminophen **OR** acetaminophen, albuterol, bisacodyl, HYDROcodone-acetaminophen, ondansetron **OR** ondansetron (ZOFRAN) IV, orphenadrine, oxyCODONE,  promethazine, sodium chloride flush  Assessment/ Plan:  51 y.o. black female with Morbid obesity, diabetes mellitus type 2, hypertension, obstructive sleep apnea, asthma, who was admitted to [facility] on 06/09/2017 for evaluation of back abscess.  1. Acute renal failure secondary to poor by mouth intake and NSAID use. The patient has been on NSAIDs including ibuprofen and naproxen. She also had recent poor by mouth intake.  -  Continue IV fluid hydration for now.     2.  Hypertension.  holding lisinopril. Blood pressure at goal.   3. 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 Back abscess.  Wbc improving. afebrile -  linezolid and meropenem. - Appreciate ID input.    LOS: [place], [place] 7/23/20182:48 PM

## 2017-06-13 NOTE — Progress Notes (Signed)
[place] at [place] NAME: Brenda Hicks    MR#:  [id number]  DATE OF BIRTH:  05/18/1966  SUBJECTIVE: Doing little better today, denies any pain anywhere. No back pain today. Kidney function is improving slowly. . no fever.   CHIEF COMPLAINT:   Chief Complaint  Patient presents with  . Recurrent Skin Infections    REVIEW OF SYSTEMS:   ROS CONSTITUTIONAL: weakness   EYES: No blurred or double vision.  EARS, NOSE, AND THROAT: No tinnitus or ear pain.  RESPIRATORY: No cough, shortness of breath, wheezing or hemoptysis.  CARDIOVASCULAR: No chest pain, orthopnea, edema.  GASTROINTESTINAL: He had nausea yesterday. No vomiting or diarrhea.  GENITOURINARY: No dysuria, hematuria.  ENDOCRINE: No polyuria, nocturia,  HEMATOLOGY: No anemia, easy bruising or bleeding SKIN: No rash or lesion. MUSCULOSKELETAL:No back pain, NEUROLOGIC: No tingling, numbness, weakness.  PSYCHIATRY: No anxiety or depression.   DRUG ALLERGIES:  No Known Allergies  VITALS:  Blood pressure 126/71, pulse 81, temperature 98.8 F (37.1 C), temperature source Oral, resp. rate 20, height 5\' 5"  (1.651 m), weight (!) 155.7 kg (343 lb 3.2 oz), SpO2 100 %.  PHYSICAL EXAMINATION:  GENERAL:  51 y.o.-year-old patient lying in the bed with no acute distress.  EYES: Pupils equal, round, reactive to light and accommodation. No scleral icterus. Extraocular muscles intact.  HEENT: Head atraumatic, normocephalic. Oropharynx and nasopharynx clear.  NECK:  Supple, no jugular venous distention. No thyroid enlargement, no tenderness.  LUNGS: Normal breath sounds bilaterally, no wheezing, rales,rhonchi or crepitation. No use of accessory muscles of respiration.  CARDIOVASCULAR: S1, S2 normal. No murmurs, rubs, or gallops.  ABDOMEN: Soft, nontender, nondistended. Bowel sounds present. No organomegaly or mass.  EXTREMITIES: No pedal edema, cyanosis, or clubbing.  NEUROLOGIC:  Cranial nerves II through XII are intact. Muscle strength 5/5 in all extremities. Sensation intact. Gait not checked.  PSYCHIATRIC: The patient is alert and oriented x 3.  SKIN: No obvious rash, lesion, or ulcer.    LABORATORY PANEL:   CBC  Recent Labs Lab 06/13/17 0534  WBC 10.1  HGB 10.9*  HCT 32.5*  PLT 236   ------------------------------------------------------------------------------------------------------------------  Chemistries   Recent Labs Lab 06/09/17 0800  06/13/17 0534  NA 135  < > 139  K 4.0  < > 4.3  CL 97*  < > 104  CO2 27  < > 27  GLUCOSE 285*  < > 118*  BUN 12  < > 28*  CREATININE 1.00  < > 2.96*  CALCIUM 8.9  < > 8.2*  AST 22  --   --   ALT 21  --   --   ALKPHOS 106  --   --   BILITOT 0.7  --   --   < > = values in this interval not displayed. ------------------------------------------------------------------------------------------------------------------  Cardiac Enzymes No results for input(s): TROPONINI in the last 168 hours. ------------------------------------------------------------------------------------------------------------------  RADIOLOGY:  US Renal  Result Date: 06/11/2017 CLINICAL DATA:  Renal failure EXAM: RENAL / URINARY TRACT ULTRASOUND COMPLETE COMPARISON:  CT abdomen pelvis 03/29/2017 FINDINGS: Right Kidney: Length: 12.1 cm. Echogenicity within normal limits. No mass or hydronephrosis visualized. Left Kidney: Length: 13.6 cm. Echogenicity within normal limits. No mass or hydronephrosis visualized. Bladder: Appears normal for degree of bladder distention. IMPRESSION: Negative renal ultrasound Electronically Signed   By: Franchot Gallo M.D.   On: 06/11/2017 13:36    EKG:   Orders placed or performed during the hospital encounter of 03/28/17  .  EKG 12-Lead  . EKG 12-Lead  . EKG    ASSESSMENT AND PLAN:   1. Sepsis secondary to abscess in the back associated with cellulitis,back abscess drained in ER /Continue Zyvox,  meropenem. Cultures done from wound From 2 days ago showing staph aureus but final results are pending.. Seen by ID.  back pain improved.  noFever.  .3. Acute kidney injury likely due to sepsis,, NSAID induced vancomycin-induced: worsening renal failure: Consulted nephrology,  Renal  sono unremarkable. Continue hydration, monitor kidney function, avoid nephrotoxic agents .renal function slightly better today.  4.morbid obesity with the diabetes mellitus type 2; ,;  #5 hyperlipidemia: Continue statins. 6.Lethargy  'improved. #7 generalized weakness secondary to sepsis, renal failure. #8 diabetes mellitus type 2; controlled blood sugars.;   Continue Lantus 70 untis at night,SSI with coverage Discussed with patient's nurse.   All the records are reviewed and case discussed with Care Management/Social Workerr. Management plans discussed with the patient, family and they are in agreement.  CODE STATUS:full  TOTAL TIME TAKING CARE OF THIS PATIENT: 35 minutes.   POSSIBLE D/C IN 1 -2DAYS, DEPENDING ON CLINICAL CONDITION.   Epifanio Lesches M.D on 06/13/2017 at 11:16 AM  Between 7am to 6pm - Pager - [phone]  After 6pm go to [email or web address] - password [email or web address] [facility]  Office  [phone]  CC: Primary care physician; [place], [place] [facility]   Note: This dictation was prepared with Dragon dictation along with smaller phrase technology. Any transcriptional errors that result from this process are unintentional.

## 2017-06-14 LAB — AEROBIC CULTURE  (SUPERFICIAL SPECIMEN)

## 2017-06-14 LAB — AEROBIC CULTURE W GRAM STAIN (SUPERFICIAL SPECIMEN): Special Requests: NORMAL

## 2017-06-14 LAB — CREATININE, SERUM
Creatinine, Ser: 2.68 mg/dL — ABNORMAL HIGH (ref 0.44–1.00)
GFR calc Af Amer: 23 mL/min — ABNORMAL LOW (ref >60)
GFR, EST NON AFRICAN AMERICAN: 20 mL/min — AB (ref >60)

## 2017-06-14 LAB — CULTURE, BLOOD (ROUTINE X 2)
CULTURE: NO GROWTH
Culture: NO GROWTH
Special Requests: ADEQUATE
Special Requests: ADEQUATE

## 2017-06-14 LAB — GLUCOSE, CAPILLARY
GLUCOSE-CAPILLARY: 90 mg/dL (ref 65–99)
GLUCOSE-CAPILLARY: 78 mg/dL (ref 65–99)
GLUCOSE-CAPILLARY: 121 mg/dL — AB (ref 65–99)
Glucose-Capillary: 116 mg/dL — ABNORMAL HIGH (ref 65–99)
Glucose-Capillary: 110 mg/dL — ABNORMAL HIGH (ref 65–99)

## 2017-06-14 MED ORDER — MAGIC MOUTHWASH
10.0000 mL | Freq: Three times a day (TID) | ORAL | Status: DC | PRN
  Administered 2017-06-14: 10 mL via ORAL
  Filled 2017-06-14: qty 10

## 2017-06-14 NOTE — Progress Notes (Signed)
[place] at [place] NAME: Brenda Hicks    MR#:  [id number]  DATE OF BIRTH:  [birth year]  SUBJECTIVE: denies any complaints. She had back pain, a lot of pus came out last night. No fever today. She feels relieved after the pus came out from the back. Admitted for  back abscess, found  to have acute renal failure.   CHIEF COMPLAINT:   Chief Complaint  Patient presents with  . Recurrent Skin Infections    REVIEW OF SYSTEMS:   ROS CONSTITUTIONAL: weakness   EYES: No blurred or double vision.  EARS, NOSE, AND THROAT: No tinnitus or ear pain.  RESPIRATORY: No cough, shortness of breath, wheezing or hemoptysis.  CARDIOVASCULAR: No chest pain, orthopnea, edema.  GASTROINTESTINAL: He had nausea yesterday. No vomiting or diarrhea.  GENITOURINARY: No dysuria, hematuria.  ENDOCRINE: No polyuria, nocturia,  HEMATOLOGY: No anemia, easy bruising or bleeding SKIN: No rash or lesion. MUSCULOSKELETAL:No back pain, NEUROLOGIC: No tingling, numbness, weakness.  PSYCHIATRY: No anxiety or depression.   DRUG ALLERGIES:  No Known Allergies  VITALS:  Blood pressure 138/86, pulse 95, temperature 99.1 F (37.3 C), temperature source Oral, resp. rate 20, height 5\' 5"  (1.651 m), weight (!) 153.1 kg (337 lb 8 oz), SpO2 98 %.  PHYSICAL EXAMINATION:  GENERAL:  51 y.o.-year-old patient lying in the bed with no acute distress.  EYES: Pupils equal, round, reactive to light and accommodation. No scleral icterus. Extraocular muscles intact.  HEENT: Head atraumatic, normocephalic. Oropharynx and nasopharynx clear.  NECK:  Supple, no jugular venous distention. No thyroid enlargement, no tenderness.  LUNGS: Normal breath sounds bilaterally, no wheezing, rales,rhonchi or crepitation. No use of accessory muscles of respiration.  CARDIOVASCULAR: S1, S2 normal. No murmurs, rubs, or gallops.  ABDOMEN: Soft, nontender, nondistended. Bowel sounds present. No  organomegaly or mass.  EXTREMITIES: No pedal edema, cyanosis, or clubbing.  NEUROLOGIC: Cranial nerves II through XII are intact. Muscle strength 5/5 in all extremities. Sensation intact. Gait not checked.  PSYCHIATRIC: The patient is alert and oriented x 3.  SKIN: No obvious rash, lesion, or ulcer.    LABORATORY PANEL:   CBC  Recent Labs Lab 06/13/17 0534  WBC 10.1  HGB 10.9*  HCT 32.5*  PLT 236   ------------------------------------------------------------------------------------------------------------------  Chemistries   Recent Labs Lab 06/09/17 0800  06/13/17 0534 06/14/17 0425  NA 135  < > 139  --   K 4.0  < > 4.3  --   CL 97*  < > 104  --   CO2 27  < > 27  --   GLUCOSE 285*  < > 118*  --   BUN 12  < > 28*  --   CREATININE 1.00  < > 2.96* 2.68*  CALCIUM 8.9  < > 8.2*  --   AST 22  --   --   --   ALT 21  --   --   --   ALKPHOS 106  --   --   --   BILITOT 0.7  --   --   --   < > = values in this interval not displayed. ------------------------------------------------------------------------------------------------------------------  Cardiac Enzymes No results for input(s): TROPONINI in the last 168 hours. ------------------------------------------------------------------------------------------------------------------  RADIOLOGY:  No results found.  EKG:   Orders placed or performed during the hospital encounter of 03/28/17  . EKG 12-Lead  . EKG 12-Lead  . EKG    ASSESSMENT AND PLAN:    1.Sepsis  secondary to abscess in the back associated with cellulitis,back abscess drained in ER /Continue Zyvox, meropenem. Cultures done from wound From 2 days ago showing staph aureus but final results are pending.. Seen by ID.  back pain improved.  noFever.Continue meropenem, Zyvox for now.  .3. Acute kidney injury likely due to sepsis,, NSAID induced vancomycin-induced: worsening renal failure: Consulted nephrology,  Renal  sono unremarkable. Continue hydration,  monitor kidney function, avoid nephrotoxic agents .renal function  better today. Decrease IV fluids   4.morbid obesity with the diabetes mellitus type 2; ,;  #5 hyperlipidemia: Continue statins. 6.Lethargy  'improved.  #7 generalized weakness secondary to sepsis, renal failure. #8 diabetes mellitus type 2; controlled blood sugars.;   Continue Lantus 70 untis at night,SSI with coverage Discussed with patient's nurse.   All the records are reviewed and case discussed with Care Management/Social Workerr. Management plans discussed with the patient, family and they are in agreement.  CODE STATUS:full  TOTAL TIME TAKING CARE OF THIS PATIENT: 35 minutes.   POSSIBLE D/C IN 1 -2DAYS, DEPENDING ON CLINICAL CONDITION.   Epifanio Lesches M.D on 06/14/2017 at 1:27 PM  Between 7am to 6pm - Pager - [phone]  After 6pm go to [email or web address] - password [email or web address] [facility]  Office  [phone]  CC: Primary care physician; [place], [place] [facility]   Note: This dictation was prepared with Dragon dictation along with smaller phrase technology. Any transcriptional errors that result from this process are unintentional.

## 2017-06-14 NOTE — Progress Notes (Signed)
[facility]  ROUNDING NOTE   Subjective:   Purulent drainage this morning  Creatinine 2.68 (2.96) (3.56)  NS at 138mL/hr  linezolid and meropenem  Objective:  Vital signs in last 24 hours:  Temp:  [98.3 F (36.8 C)-99.1 F (37.3 C)] 99 F (37.2 C) (07/24 1403) Pulse Rate:  [85-95] 85 (07/24 1403) Resp:  [20] 20 (07/24 0358) BP: (136-150)/(70-86) 147/84 (07/24 1403) SpO2:  [98 %-100 %] 99 % (07/24 1403) Weight:  [153.1 kg (337 lb 8 oz)] 153.1 kg (337 lb 8 oz) (07/24 0500)  Weight change: -2.586 kg (-5 lb 11.2 oz) Filed Weights   06/12/17 0500 06/13/17 0500 06/14/17 0500  Weight: (!) 152.8 kg (336 lb 12.8 oz) (!) 155.7 kg (343 lb 3.2 oz) (!) 153.1 kg (337 lb 8 oz)    Intake/Output: I/O last 3 completed shifts: In: 6079 [P.O.:720; I.V.:4209; IV Piggyback:1150] Out: 900 [Urine:900]   Intake/Output this shift:  Total I/O In: 240 [P.O.:240] Out: -   Physical Exam: General: No acute distress, morbidly obese  Head: Normocephalic, atraumatic. Moist oral mucosal membranes  Eyes: Anicteric  Neck: Supple, trachea midline  Lungs:  Clear to auscultation, normal effort  Heart: S1S2 no rubs  Abdomen:  Soft, nontender, bowel sounds present  Extremities: No peripheral edema.  Neurologic: Awake, alert, following commands  Skin: Left scapular lesion with dressings.        Basic Metabolic Panel:  Recent Labs Lab 06/09/17 0800 06/10/17 0552 06/10/17 1413 06/11/17 0643 06/12/17 0505 06/13/17 0534 06/14/17 0425  NA 135 134*  --  134*  --  139  --   K 4.0 4.2  --  4.2  --  4.3  --   CL 97* 99*  --  98*  --  104  --   CO2 27 26  --  27  --  27  --   GLUCOSE 285* 213*  --  167*  --  118*  --   BUN 12 18  --  27*  --  28*  --   CREATININE 1.00 1.59* 2.35* 3.40* 3.56* 2.96* 2.68*  CALCIUM 8.9 8.5*  --  8.0*  --  8.2*  --     Liver Function Tests:  Recent Labs Lab 06/09/17 0800  AST 22  ALT 21  ALKPHOS 106  BILITOT 0.7  PROT 8.0  ALBUMIN 3.7    No results for input(s): LIPASE, AMYLASE in the last 168 hours. No results for input(s): AMMONIA in the last 168 hours.  CBC:  Recent Labs Lab 06/09/17 0800 06/10/17 0552 06/13/17 0534  WBC 13.0* 13.6* 10.1  HGB 13.7 12.4 10.9*  HCT 41.9 37.6 32.5*  MCV 91.0 91.5 93.2  PLT 274 220 236    Cardiac Enzymes: No results for input(s): CKTOTAL, CKMB, CKMBINDEX, TROPONINI in the last 168 hours.  BNP: Invalid input(s): POCBNP  CBG:  Recent Labs Lab 06/13/17 1651 06/13/17 1958 06/14/17 0221 06/14/17 0753 06/14/17 1136  GLUCAP 79 213* 116* 90 47    Microbiology: Results for orders placed or performed during the hospital encounter of 06/09/17  Blood Culture (routine x 2)     Status: None   Collection Time: 06/09/17  8:00 AM  Result Value Ref Range Status   Specimen Description BLOOD LEFT HAND  Final   Special Requests   Final    BOTTLES DRAWN AEROBIC AND ANAEROBIC Blood Culture adequate volume   Culture NO GROWTH 5 DAYS  Final   Report Status 06/14/2017 FINAL  Final  Blood Culture (routine x 2)     Status: None   Collection Time: 06/09/17  8:39 AM  Result Value Ref Range Status   Specimen Description BLOOD LEFT AC  Final   Special Requests   Final    BOTTLES DRAWN AEROBIC AND ANAEROBIC Blood Culture adequate volume   Culture NO GROWTH 5 DAYS  Final   Report Status 06/14/2017 FINAL  Final  Urine culture     Status: Abnormal   Collection Time: 06/09/17  8:41 AM  Result Value Ref Range Status   Specimen Description URINE, RANDOM  Final   Special Requests NONE  Final   Culture MULTIPLE SPECIES PRESENT, SUGGEST RECOLLECTION (A)  Final   Report Status 06/10/2017 FINAL  Final  CULTURE, BLOOD (ROUTINE X 2) w Reflex to ID Panel     Status: None (Preliminary result)   Collection Time: 06/10/17  6:09 PM  Result Value Ref Range Status   Specimen Description BLOOD BLOOD RIGHT ARM  Final   Special Requests   Final    BOTTLES DRAWN AEROBIC AND ANAEROBIC Blood Culture  adequate volume   Culture NO GROWTH 4 DAYS  Final   Report Status PENDING  Incomplete  CULTURE, BLOOD (ROUTINE X 2) w Reflex to ID Panel     Status: None (Preliminary result)   Collection Time: 06/10/17  9:37 PM  Result Value Ref Range Status   Specimen Description BLOOD LEFT HAND  Final   Special Requests   Final    BOTTLES DRAWN AEROBIC AND ANAEROBIC Blood Culture adequate volume   Culture NO GROWTH 4 DAYS  Final   Report Status PENDING  Incomplete  Aerobic Culture (superficial specimen)     Status: None   Collection Time: 06/11/17  4:28 PM  Result Value Ref Range Status   Specimen Description BACK  Final   Special Requests Normal  Final   Gram Stain   Final    FEW WBC PRESENT, PREDOMINANTLY PMN RARE GRAM POSITIVE COCCI IN CLUSTERS Performed at [facility], [place] [address], [place], [place] [address]    Culture FEW METHICILLIN RESISTANT STAPHYLOCOCCUS AUREUS  Final   Report Status 06/14/2017 FINAL  Final   Organism ID, Bacteria METHICILLIN RESISTANT STAPHYLOCOCCUS AUREUS  Final      Susceptibility   Methicillin resistant staphylococcus aureus - MIC*    CIPROFLOXACIN 1 SENSITIVE Sensitive     ERYTHROMYCIN >=8 RESISTANT Resistant     GENTAMICIN <=0.5 SENSITIVE Sensitive     OXACILLIN >=4 RESISTANT Resistant     TETRACYCLINE <=1 SENSITIVE Sensitive     VANCOMYCIN <=0.5 SENSITIVE Sensitive     TRIMETH/SULFA <=10 SENSITIVE Sensitive     CLINDAMYCIN <=0.25 SENSITIVE Sensitive     RIFAMPIN <=0.5 SENSITIVE Sensitive     Inducible Clindamycin NEGATIVE Sensitive     * FEW METHICILLIN RESISTANT STAPHYLOCOCCUS AUREUS    Coagulation Studies: No results for input(s): LABPROT, INR in the last 72 hours.  Urinalysis: No results for input(s): COLORURINE, LABSPEC, PHURINE, GLUCOSEU, HGBUR, BILIRUBINUR, KETONESUR, PROTEINUR, UROBILINOGEN, NITRITE, LEUKOCYTESUR in the last 72 hours.  Invalid input(s): APPERANCEUR    Imaging: No results found.   Medications:   . sodium  chloride 50 mL/hr at 06/14/17 1514  . linezolid (ZYVOX) IV Stopped (06/14/17 1156)  . meropenem (MERREM) 1 GM IVPB Stopped (06/14/17 0920)   . aspirin EC  81 mg Oral Daily  . atorvastatin  40 mg Oral Daily  . docusate sodium  100 mg Oral BID  . gabapentin  300 mg Oral QHS  . heparin subcutaneous  5,000 Units Subcutaneous Q8H  . insulin aspart  0-5 Units Subcutaneous QHS  . insulin aspart  0-9 Units Subcutaneous TID WC  . insulin aspart  3 Units Subcutaneous TID WC  . insulin glargine  70 Units Subcutaneous QHS   acetaminophen **OR** acetaminophen, albuterol, bisacodyl, HYDROcodone-acetaminophen, magic mouthwash, ondansetron **OR** ondansetron (ZOFRAN) IV, orphenadrine, oxyCODONE, promethazine, sodium chloride flush  Assessment/ Plan:  51 y.o. black female with Morbid obesity, diabetes mellitus type 2, hypertension, obstructive sleep apnea, asthma, who was admitted to [facility] on 06/09/2017 for evaluation of back abscess.  1. Acute renal failure secondary to poor by mouth intake and NSAID use. The patient has been on NSAIDs including ibuprofen and naproxen. She also had recent poor by mouth intake.  - Continue IV fluid hydration NS at 149mL/hr  2.  Hypertension.  holding lisinopril. Blood pressure at goal.   3. 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 Back abscess.  Wbc improving. afebrile -  linezolid and meropenem. - Appreciate ID input.    LOS: 5 Brenda Hicks 7/24/20183:56 PM

## 2017-06-14 NOTE — Progress Notes (Signed)
Pharmacy Antibiotic Note  Brenda Hicks is a 51 y.o. female with diabetes admitted on 06/09/2017 with abscess/cellultis of back abscess.  Pharmacy consulted to dose antibiotics.   Plan: Continue linezolid 600mg  IV Q12hr.   Continue meropenem 1g IV Q12hr. Recommend narrowing to MRSA coverage as wound growing MRSA.     Height: 5\' 5"  (165.1 cm) Weight: (!) 337 lb 8 oz (153.1 kg) IBW/kg (Calculated) : 57  Temp (24hrs), Avg:98.7 F (37.1 C), Min:98.3 F (36.8 C), Max:99.1 F (37.3 C)   Recent Labs Lab 06/09/17 0800 06/09/17 1131 06/10/17 0552 06/10/17 1010 06/10/17 1413 06/10/17 1809 06/10/17 2137 06/11/17 0038 06/11/17 0643 06/12/17 0505 06/13/17 0534 06/14/17 0425  WBC 13.0*  --  13.6*  --   --   --   --   --   --   --  10.1  --   CREATININE 1.00  --  1.59*  --  2.35*  --   --   --  3.40* 3.56* 2.96* 2.68*  LATICACIDVEN 1.8 1.7  --   --   --  1.2 2.3* 2.1*  --   --   --   --   VANCORANDOM  --   --   --  20  --   --   --   --  13  --   --   --     Estimated Creatinine Clearance: 38.2 mL/min (A) (by C-G formula based on SCr of 2.68 mg/dL (H)).    No Known Allergies  Antimicrobials this admission: Vancomycin 7/19 >> Zosyn 7/19 >> 7/20 Meropenem 7/20 >> Linezolid >> 7/21 >>  Dose adjustments this admission: Vancomycin random: 46mcg/ml,  7 hr after dose  Microbiology results: 7/19 Urine: multiple species 7/19 Blood x2: NGTD 7/20 Bcx: NGTD 7/21: Wound cx (back): MRSA  Thank you for allowing pharmacy to be a part of this patient's care.  Simpson,Michael L 06/14/2017 4:23 PM

## 2017-06-14 NOTE — Plan of Care (Signed)
Problem: Pain Managment: Goal: General experience of comfort will improve Outcome: Progressing Pt free from injury this shift. Refuses bed alarm. Up in room independently. Dressing to mid-back changed d/t drainage. No complaints of pain.

## 2017-06-14 NOTE — Care Management Note (Signed)
Case Management Note  Patient Details  Name: Brenda Hicks MRN: [id number] Date of Birth: [birth year]  Subjective/Objective:                   Admitted to [facility] with sepsis of the skin. Lives with husband, Brenda Hicks [phone]). Last seen Dr, Pauline Good at [facility] in [place] about 2 weeks ago, next appointment is in September. Prescriptions are filled at [facility] about 4 years ago per [facility]. No skilled nursing. No home oxygen. No equipment in the home. Takes care of all basic and instrumental activities of daily living herself, drives. Last fall was 2 months ago. Decreased appetite x 2 weeks, Family will transport.  Action/Plan: No follow-up needs identified at this time.   Expected Discharge Date:  06/10/17               Expected Discharge Plan:     In-House Referral:     Discharge planning Services     Post Acute Care Choice:    Choice offered to:     DME Arranged:    DME Agency:     HH Arranged:    HH Agency:     Status of Service:     If discussed at [employer], dates discussed:    Additional Comments:  Shelbie Ammons, RN MSN CCM Care Management [phone] 06/14/2017, 9:40 AM

## 2017-06-15 LAB — CULTURE, BLOOD (ROUTINE X 2)
CULTURE: NO GROWTH
Culture: NO GROWTH
Special Requests: ADEQUATE
Special Requests: ADEQUATE

## 2017-06-15 LAB — BASIC METABOLIC PANEL
Anion gap: 6 (ref 5–15)
BUN: 19 mg/dL (ref 6–20)
CHLORIDE: 106 mmol/L (ref 101–111)
CO2: 28 mmol/L (ref 22–32)
Calcium: 8.4 mg/dL — ABNORMAL LOW (ref 8.9–10.3)
Creatinine, Ser: 2.25 mg/dL — ABNORMAL HIGH (ref 0.44–1.00)
GFR calc Af Amer: 28 mL/min — ABNORMAL LOW (ref >60)
GFR calc non Af Amer: 24 mL/min — ABNORMAL LOW (ref >60)
GLUCOSE: 91 mg/dL (ref 65–99)
POTASSIUM: 4.6 mmol/L (ref 3.5–5.1)
Sodium: 140 mmol/L (ref 135–145)

## 2017-06-15 LAB — GLUCOSE, CAPILLARY
Glucose-Capillary: 104 mg/dL — ABNORMAL HIGH (ref 65–99)
Glucose-Capillary: 128 mg/dL — ABNORMAL HIGH (ref 65–99)

## 2017-06-15 MED ORDER — DOXYCYCLINE HYCLATE 100 MG PO TABS
100.0000 mg | ORAL_TABLET | Freq: Two times a day (BID) | ORAL | Status: DC
  Administered 2017-06-15: 11:00:00 100 mg via ORAL
  Filled 2017-06-15 (×2): qty 1

## 2017-06-15 MED ORDER — DOXYCYCLINE HYCLATE 100 MG PO TABS: 100.0000 mg | ORAL_TABLET | Freq: Two times a day (BID) | ORAL | 0 refills | Status: DC

## 2017-06-15 MED ORDER — HYDROCODONE-ACETAMINOPHEN 5-325 MG PO TABS: 1.0000 | ORAL_TABLET | Freq: Three times a day (TID) | ORAL | 0 refills | Status: DC | PRN

## 2017-06-15 NOTE — Progress Notes (Signed)
[facility]  ROUNDING NOTE   Subjective:   Mother at bedside.   Creatinine 2.25 (2.68)  Objective:  Vital signs in last 24 hours:  Temp:  [97.9 F (36.6 C)-99 F (37.2 C)] 98.1 F (36.7 C) (07/25 0605) Pulse Rate:  [85-95] 89 (07/25 0605) Resp:  [20] 20 (07/25 0605) BP: (137-160)/(84-91) 137/90 (07/25 0605) SpO2:  [99 %-100 %] 99 % (07/25 0605) Weight:  [156.4 kg (344 lb 11.2 oz)] 156.4 kg (344 lb 11.2 oz) (07/25 0500)  Weight change: 3.266 kg (7 lb 3.2 oz) Filed Weights   06/13/17 0500 06/14/17 0500 06/15/17 0500  Weight: (!) 155.7 kg (343 lb 3.2 oz) (!) 153.1 kg (337 lb 8 oz) (!) 156.4 kg (344 lb 11.2 oz)    Intake/Output: I/O last 3 completed shifts: In: 0277 [P.O.:240; I.V.:1065; IV Piggyback:400] Out: 4128 [Urine:1875]   Intake/Output this shift:  No intake/output data recorded.  Physical Exam: General: No acute distress, morbidly obese  Head: Normocephalic, atraumatic. Moist oral mucosal membranes  Eyes: Anicteric  Neck: Supple, trachea midline  Lungs:  Clear to auscultation, normal effort  Heart: S1S2 no rubs  Abdomen:  Soft, nontender, bowel sounds present  Extremities: No peripheral edema.  Neurologic: Awake, alert, following commands  Skin: Left scapular lesion with dressings.        Basic Metabolic Panel:  Recent Labs Lab 06/09/17 0800 06/10/17 0552  06/11/17 0643 06/12/17 0505 06/13/17 0534 06/14/17 0425 06/15/17 0800  NA 135 134*  --  134*  --  139  --  140  K 4.0 4.2  --  4.2  --  4.3  --  4.6  CL 97* 99*  --  98*  --  104  --  106  CO2 27 26  --  27  --  27  --  28  GLUCOSE 285* 213*  --  167*  --  118*  --  91  BUN 12 18  --  27*  --  28*  --  19  CREATININE 1.00 1.59*  < > 3.40* 3.56* 2.96* 2.68* 2.25*  CALCIUM 8.9 8.5*  --  8.0*  --  8.2*  --  8.4*  < > = values in this interval not displayed.  Liver Function Tests:  Recent Labs Lab 06/09/17 0800  AST 22  ALT 21  ALKPHOS 106  BILITOT 0.7  PROT 8.0  ALBUMIN  3.7   No results for input(s): LIPASE, AMYLASE in the last 168 hours. No results for input(s): AMMONIA in the last 168 hours.  CBC:  Recent Labs Lab 06/09/17 0800 06/10/17 0552 06/13/17 0534  WBC 13.0* 13.6* 10.1  HGB 13.7 12.4 10.9*  HCT 41.9 37.6 32.5*  MCV 91.0 91.5 93.2  PLT 274 220 236    Cardiac Enzymes: No results for input(s): CKTOTAL, CKMB, CKMBINDEX, TROPONINI in the last 168 hours.  BNP: Invalid input(s): POCBNP  CBG:  Recent Labs Lab 06/14/17 1136 06/14/17 1715 06/14/17 2047 06/15/17 0724 06/15/17 1139  GLUCAP 78 110* 121* 104* 128*    Microbiology: Results for orders placed or performed during the hospital encounter of 06/09/17  Blood Culture (routine x 2)     Status: None   Collection Time: 06/09/17  8:00 AM  Result Value Ref Range Status   Specimen Description BLOOD LEFT HAND  Final   Special Requests   Final    BOTTLES DRAWN AEROBIC AND ANAEROBIC Blood Culture adequate volume   Culture NO GROWTH 5 DAYS  Final  Report Status 06/14/2017 FINAL  Final  Blood Culture (routine x 2)     Status: None   Collection Time: 06/09/17  8:39 AM  Result Value Ref Range Status   Specimen Description BLOOD LEFT AC  Final   Special Requests   Final    BOTTLES DRAWN AEROBIC AND ANAEROBIC Blood Culture adequate volume   Culture NO GROWTH 5 DAYS  Final   Report Status 06/14/2017 FINAL  Final  Urine culture     Status: Abnormal   Collection Time: 06/09/17  8:41 AM  Result Value Ref Range Status   Specimen Description URINE, RANDOM  Final   Special Requests NONE  Final   Culture MULTIPLE SPECIES PRESENT, SUGGEST RECOLLECTION (A)  Final   Report Status 06/10/2017 FINAL  Final  CULTURE, BLOOD (ROUTINE X 2) w Reflex to ID Panel     Status: None   Collection Time: 06/10/17  6:09 PM  Result Value Ref Range Status   Specimen Description BLOOD BLOOD RIGHT ARM  Final   Special Requests   Final    BOTTLES DRAWN AEROBIC AND ANAEROBIC Blood Culture adequate volume    Culture NO GROWTH 5 DAYS  Final   Report Status 06/15/2017 FINAL  Final  CULTURE, BLOOD (ROUTINE X 2) w Reflex to ID Panel     Status: None   Collection Time: 06/10/17  9:37 PM  Result Value Ref Range Status   Specimen Description BLOOD LEFT HAND  Final   Special Requests   Final    BOTTLES DRAWN AEROBIC AND ANAEROBIC Blood Culture adequate volume   Culture NO GROWTH 5 DAYS  Final   Report Status 06/15/2017 FINAL  Final  Aerobic Culture (superficial specimen)     Status: None   Collection Time: 06/11/17  4:28 PM  Result Value Ref Range Status   Specimen Description BACK  Final   Special Requests Normal  Final   Gram Stain   Final    FEW WBC PRESENT, PREDOMINANTLY PMN RARE GRAM POSITIVE COCCI IN CLUSTERS Performed at [facility], [place] [address], [place], [place] [address]    Culture FEW METHICILLIN RESISTANT STAPHYLOCOCCUS AUREUS  Final   Report Status 06/14/2017 FINAL  Final   Organism ID, Bacteria METHICILLIN RESISTANT STAPHYLOCOCCUS AUREUS  Final      Susceptibility   Methicillin resistant staphylococcus aureus - MIC*    CIPROFLOXACIN 1 SENSITIVE Sensitive     ERYTHROMYCIN >=8 RESISTANT Resistant     GENTAMICIN <=0.5 SENSITIVE Sensitive     OXACILLIN >=4 RESISTANT Resistant     TETRACYCLINE <=1 SENSITIVE Sensitive     VANCOMYCIN <=0.5 SENSITIVE Sensitive     TRIMETH/SULFA <=10 SENSITIVE Sensitive     CLINDAMYCIN <=0.25 SENSITIVE Sensitive     RIFAMPIN <=0.5 SENSITIVE Sensitive     Inducible Clindamycin NEGATIVE Sensitive     * FEW METHICILLIN RESISTANT STAPHYLOCOCCUS AUREUS    Coagulation Studies: No results for input(s): LABPROT, INR in the last 72 hours.  Urinalysis: No results for input(s): COLORURINE, LABSPEC, PHURINE, GLUCOSEU, HGBUR, BILIRUBINUR, KETONESUR, PROTEINUR, UROBILINOGEN, NITRITE, LEUKOCYTESUR in the last 72 hours.  Invalid input(s): APPERANCEUR    Imaging: No results found.   Medications:    . aspirin EC  81 mg Oral Daily  .  atorvastatin  40 mg Oral Daily  . docusate sodium  100 mg Oral BID  . doxycycline  100 mg Oral Q12H  . gabapentin  300 mg Oral QHS  . heparin subcutaneous  5,000 Units Subcutaneous Q8H  .  insulin aspart  0-5 Units Subcutaneous QHS  . insulin aspart  0-9 Units Subcutaneous TID WC  . insulin aspart  3 Units Subcutaneous TID WC  . insulin glargine  70 Units Subcutaneous QHS   acetaminophen **OR** acetaminophen, albuterol, bisacodyl, HYDROcodone-acetaminophen, magic mouthwash, ondansetron **OR** ondansetron (ZOFRAN) IV, orphenadrine, oxyCODONE, promethazine, sodium chloride flush  Assessment/ Plan:  51 y.o. black female with Morbid obesity, diabetes mellitus type 2, hypertension, obstructive sleep apnea, asthma, who was admitted to [facility] on 06/09/2017 for evaluation of back abscess.  1. Acute renal failure secondary to poor by mouth intake and NSAID use. The patient has been on NSAIDs including ibuprofen and naproxen. She also had recent poor by mouth intake.   2.  Hypertension.  holding lisinopril. Blood pressure at goal.   3. 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 Back abscess.  Wbc improving. afebrile -  linezolid   - Appreciate ID input.    LOS: [place], Kaily Wragg 7/25/20181:19 PM

## 2017-06-15 NOTE — Care Management (Signed)
Discharge to home today per Dr. Vianne Bulls. No follow-up needs identified. Family will transport Shelbie Ammons RN MSN CCM Care Management [phone]

## 2017-06-15 NOTE — Discharge Instructions (Signed)
Wound dressing - packing strip and swab to fill, then covering with gauze and tape. Daily.  Diabetic diet  Activity as tolerated

## 2017-06-20 NOTE — Discharge Summary (Signed)
[place] [facility] [place] NAME: Brenda Hicks    MR#:  [id number]  DATE OF BIRTH:  1966/05/25  DATE OF ADMISSION:  06/09/2017 ADMITTING PHYSICIAN: Epifanio Lesches, MD  DATE OF DISCHARGE: 06/15/2017 12:00 PM  PRIMARY CARE PHYSICIAN: [place]   ADMISSION DIAGNOSIS:  Cellulitis and abscess of trunk [L03.319, L02.219] Sepsis, due to unspecified organism ([place]) [A41.9]  DISCHARGE DIAGNOSIS:  Active Problems:   Sepsis affecting skin ([place])   SECONDARY DIAGNOSIS:   Past Medical History:  Diagnosis Date  . Arthritis   . Asthma    WELL CONTROLLED  . Cancer of ear   . Diabetes mellitus without complication ([place])   . Hypertension   . Sleep apnea    USES CPAP     ADMITTING HISTORY  HISTORY OF PRESENT ILLNESS:  Brenda Hicks  is a 51 y.o. female with macular obesity, hypertension, diabetes mellitus type 2 admitted because of skin infection in the back and fever. Patient has been having skin infections for almost 2 years in different parts of the body, now comes in with infection in the back  Started 3 days Ago and then changed  to puscollection associated with fever last night. Patient came to emergency room, found to have abscess in the back . Patient had abscess drained in the emergency room. Admitted for sepsis.   HOSPITAL COURSE:   * Sepsis secondary to abscess in the back associated with cellulitis,back abscess drained in ER Initially started on vancomycin and meropenem. Due to acute renal failure vancomycin was stopped and changed to Zosyn. Infectious disease saw the patient. Cultures grew MRSA. Patient is being switched to doxycycline orally for 1 more week at discharge. Follow-up with primary care physician as outpatient.  * Acute kidney injury due to NSAIDs, sepsis. Could also be vancomycin. Start on IV fluids. Appreciate nephrology input. Started on fluids and creatinine has slowly trended down and is close to  normal. Will need nephrology follow-up within a week for repeat lab work. NSAIDs stopped. Metformin discontinued.  * Diabetes mellitus type 2, well controlled No change in home meds  *  hyperlipidemia: Continue statins. *Lethargy  Improved.  Stable for discharge home.  CONSULTS OBTAINED:  Treatment Team:  Leonel Ramsay, MD Anthonette Legato, MD  DRUG ALLERGIES:  No Known Allergies  DISCHARGE MEDICATIONS:   Discharge Medication List as of 06/15/2017 10:05 AM    START taking these medications   Details  doxycycline (VIBRA-TABS) 100 MG tablet Take 1 tablet (100 mg total) by mouth every 12 (twelve) hours., Starting Wed 06/15/2017, Print    HYDROcodone-acetaminophen (NORCO/VICODIN) 5-325 MG tablet Take 1 tablet by mouth every 8 (eight) hours as needed for moderate pain., Starting Wed 06/15/2017, Print      CONTINUE these medications which have NOT CHANGED   Details  aspirin EC 81 MG tablet Take 81 mg by mouth daily. , Starting Wed 01/14/2016, Historical Med    atorvastatin (LIPITOR) 40 MG tablet Take 1 tablet (40 mg total) by mouth daily., Starting Sun 12/07/2015, Until Thu 06/09/2017, Print    gabapentin (NEURONTIN) 300 MG capsule Take 300 mg by mouth 3 (three) times daily. , Historical Med    insulin glargine (LANTUS) 100 UNIT/ML injection Inject 100 Units into the skin at bedtime. , Historical Med    insulin lispro (HUMALOG) 100 UNIT/ML injection Inject 20 Units into the skin 3 (three) times daily with meals. , Starting Wed 01/14/2016, Until Thu 06/09/2017, Historical Med  levonorgestrel (MIRENA) 20 MCG/24HR IUD 1 each by Intrauterine route once., Historical Med    sitaGLIPtin (JANUVIA) 100 MG tablet Take 100 mg by mouth daily. Patient reports taking Januvia 100 mg daily, Historical Med    zolpidem (AMBIEN) 5 MG tablet Take 1 tablet (5 mg total) by mouth at bedtime as needed for sleep., Starting Tue 12/14/2016, Print    albuterol (PROVENTIL HFA;VENTOLIN HFA) 108 (90 Base)  MCG/ACT inhaler Inhale 2 puffs into the lungs every 6 (six) hours as needed for wheezing or shortness of breath., Historical Med    lisinopril (PRINIVIL,ZESTRIL) 40 MG tablet Take 40 mg by mouth every evening. , Starting Wed 01/14/2016, Until Thu 01/13/2017, Historical Med    orphenadrine (NORFLEX) 100 MG tablet Take 1 tablet (100 mg total) by mouth 2 (two) times daily as needed for muscle spasms., Starting Sun 05/29/2017, Print      STOP taking these medications     diclofenac (VOLTAREN) 75 MG EC tablet      metFORMIN (GLUCOPHAGE) 1000 MG tablet      ibuprofen (ADVIL,MOTRIN) 800 MG tablet      naproxen sodium (ANAPROX) 275 MG tablet      oxyCODONE (ROXICODONE) 5 MG immediate release tablet      oxyCODONE-acetaminophen (ROXICET) 5-325 MG tablet         Today   VITAL SIGNS:  Blood pressure 137/90, pulse 89, temperature 98.1 F (36.7 C), temperature source Oral, resp. rate 20, height 5\' 5"  (1.651 m), weight (!) 156.4 kg (344 lb 11.2 oz), SpO2 99 %.  I/O:  No intake or output data in the 24 hours ending 06/20/17 1436  PHYSICAL EXAMINATION:  Physical Exam  GENERAL:  51 y.o.-year-old patient lying in the bed with no acute distress. Obese LUNGS: Normal breath sounds bilaterally, no wheezing, rales,rhonchi or crepitation. No use of accessory muscles of respiration.  CARDIOVASCULAR: S1, S2 normal. No murmurs, rubs, or gallops.  ABDOMEN: Soft, non-tender, non-distended. Bowel sounds present. No organomegaly or mass.  NEUROLOGIC: Moves all 4 extremities. PSYCHIATRIC: The patient is alert and oriented x 3.  SKIN: Small 0.5 cm ulcer upper back without discharge  DATA REVIEW:   CBC No results for input(s): WBC, HGB, HCT, PLT in the last 168 hours.  Chemistries   Recent Labs Lab 06/15/17 0800  NA 140  K 4.6  CL 106  CO2 28  GLUCOSE 91  BUN 19  CREATININE 2.25*  CALCIUM 8.4*    Cardiac Enzymes No results for input(s): TROPONINI in the last 168 hours.  Microbiology  Results  Results for orders placed or performed during the hospital encounter of 06/09/17  Blood Culture (routine x 2)     Status: None   Collection Time: 06/09/17  8:00 AM  Result Value Ref Range Status   Specimen Description BLOOD LEFT HAND  Final   Special Requests   Final    BOTTLES DRAWN AEROBIC AND ANAEROBIC Blood Culture adequate volume   Culture NO GROWTH 5 DAYS  Final   Report Status 06/14/2017 FINAL  Final  Blood Culture (routine x 2)     Status: None   Collection Time: 06/09/17  8:39 AM  Result Value Ref Range Status   Specimen Description BLOOD LEFT AC  Final   Special Requests   Final    BOTTLES DRAWN AEROBIC AND ANAEROBIC Blood Culture adequate volume   Culture NO GROWTH 5 DAYS  Final   Report Status 06/14/2017 FINAL  Final  Urine culture     Status:  Abnormal   Collection Time: 06/09/17  8:41 AM  Result Value Ref Range Status   Specimen Description URINE, RANDOM  Final   Special Requests NONE  Final   Culture MULTIPLE SPECIES PRESENT, SUGGEST RECOLLECTION (A)  Final   Report Status 06/10/2017 FINAL  Final  CULTURE, BLOOD (ROUTINE X 2) w Reflex to ID Panel     Status: None   Collection Time: 06/10/17  6:09 PM  Result Value Ref Range Status   Specimen Description BLOOD BLOOD RIGHT ARM  Final   Special Requests   Final    BOTTLES DRAWN AEROBIC AND ANAEROBIC Blood Culture adequate volume   Culture NO GROWTH 5 DAYS  Final   Report Status 06/15/2017 FINAL  Final  CULTURE, BLOOD (ROUTINE X 2) w Reflex to ID Panel     Status: None   Collection Time: 06/10/17  9:37 PM  Result Value Ref Range Status   Specimen Description BLOOD LEFT HAND  Final   Special Requests   Final    BOTTLES DRAWN AEROBIC AND ANAEROBIC Blood Culture adequate volume   Culture NO GROWTH 5 DAYS  Final   Report Status 06/15/2017 FINAL  Final  Aerobic Culture (superficial specimen)     Status: None   Collection Time: 06/11/17  4:28 PM  Result Value Ref Range Status   Specimen Description BACK  Final    Special Requests Normal  Final   Gram Stain   Final    FEW WBC PRESENT, PREDOMINANTLY PMN RARE GRAM POSITIVE COCCI IN CLUSTERS Performed at [facility], [place] [address], [place], [place] [address]    Culture FEW METHICILLIN RESISTANT STAPHYLOCOCCUS AUREUS  Final   Report Status 06/14/2017 FINAL  Final   Organism ID, Bacteria METHICILLIN RESISTANT STAPHYLOCOCCUS AUREUS  Final      Susceptibility   Methicillin resistant staphylococcus aureus - MIC*    CIPROFLOXACIN 1 SENSITIVE Sensitive     ERYTHROMYCIN >=8 RESISTANT Resistant     GENTAMICIN <=0.5 SENSITIVE Sensitive     OXACILLIN >=4 RESISTANT Resistant     TETRACYCLINE <=1 SENSITIVE Sensitive     VANCOMYCIN <=0.5 SENSITIVE Sensitive     TRIMETH/SULFA <=10 SENSITIVE Sensitive     CLINDAMYCIN <=0.25 SENSITIVE Sensitive     RIFAMPIN <=0.5 SENSITIVE Sensitive     Inducible Clindamycin NEGATIVE Sensitive     * FEW METHICILLIN RESISTANT STAPHYLOCOCCUS AUREUS    RADIOLOGY:  No results found.  Follow up with PCP in 1 week.  Management plans discussed with the patient, family and they are in agreement.  CODE STATUS:  Code Status History    Date Active Date Inactive Code Status Order ID Comments User Context   06/09/2017  9:40 AM 06/15/2017  3:00 PM Full Code [id number]  Epifanio Lesches, MD ED   12/14/2016  2:15 PM 12/14/2016  8:08 PM Full Code [id number]  Poggi, Marshall Cork, MD Inpatient      TOTAL TIME TAKING CARE OF THIS PATIENT ON DAY OF DISCHARGE: more than 30 minutes.   Hillary Bow R M.D on 06/20/2017 at 2:36 PM  Between 7am to 6pm - Pager - [phone]  After 6pm go to [email or web address] - password [email or web address] [facility]  Office  [phone]  CC: Primary care physician; [facility]  Note: This dictation was prepared with Dragon dictation along with smaller phrase technology. Any transcriptional errors that result from this process are unintentional.

## 2017-07-26 IMAGING — CT CT MAXILLOFACIAL W/O CM
4 of 10 series · 17 of 47 positions shown, 18 images · non-contrast
Comparison: None.

CLINICAL DATA: Woke up with right facial pain and swollen.
Right-sided headache.

EXAM:
CT HEAD WITHOUT CONTRAST
CT MAXILLOFACIAL WITHOUT CONTRAST
TECHNIQUE: Multidetector CT imaging of the head and maxillofacial structures
were performed using the standard protocol without intravenous
contrast. Multiplanar CT image reconstructions of the maxillofacial
structures were also generated.

[Series 3: coronal soft tissue · coronal · 0.31mm/px · 1 of 66 slices shown]
[im 33/66  bone]
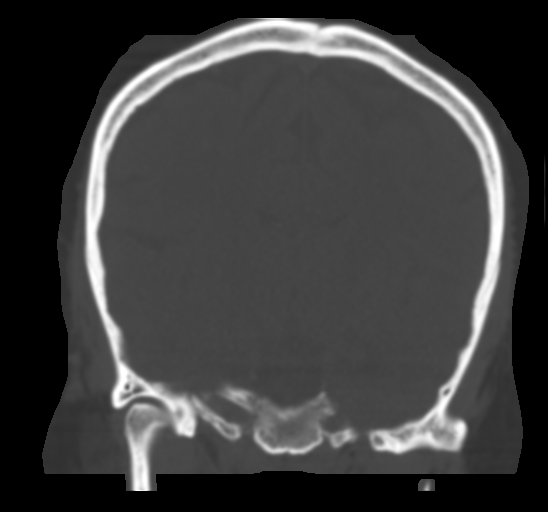

[Series 10: max soft · axial · 0.37mm/px · z∈[-195,-73]mm · 8 of 73 slices shown (1 of 2)]
[im 6/73  brain]
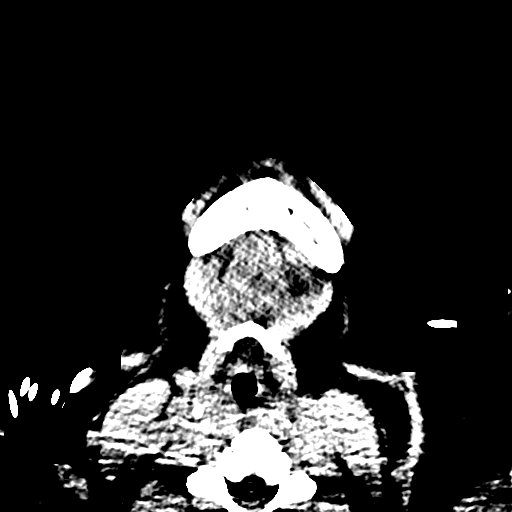
[im 16/73  brain]
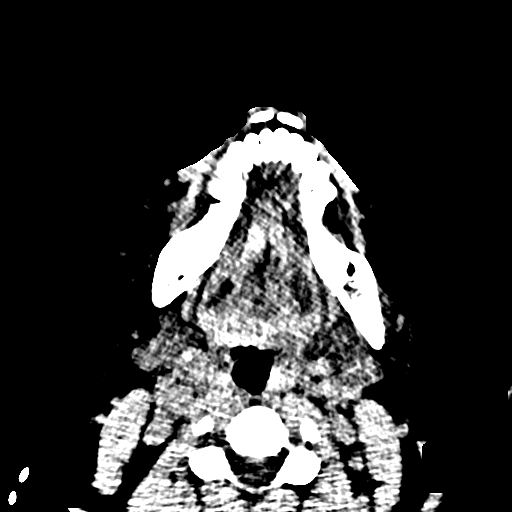
[im 26/73  brain]
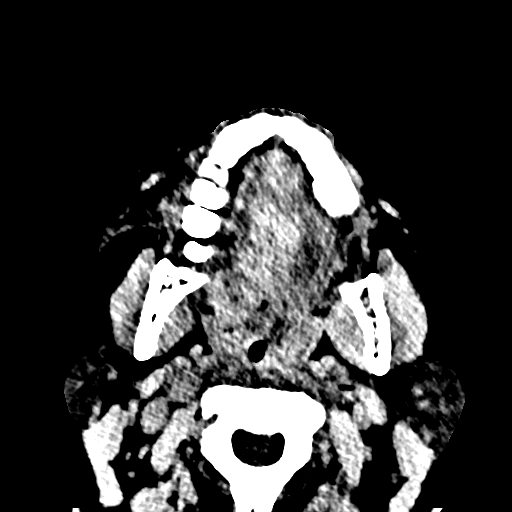
[im 31/73  brain]
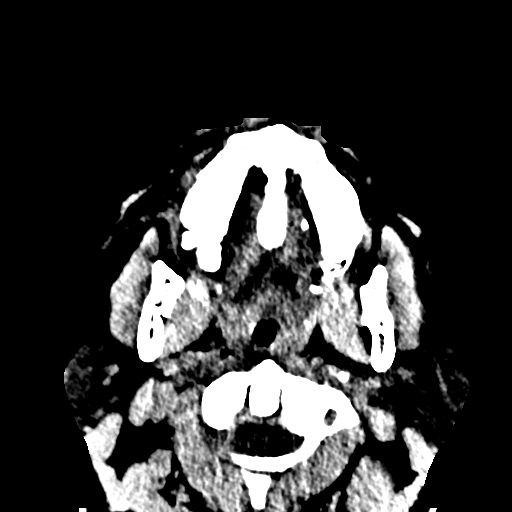
[im 42/73  brain]
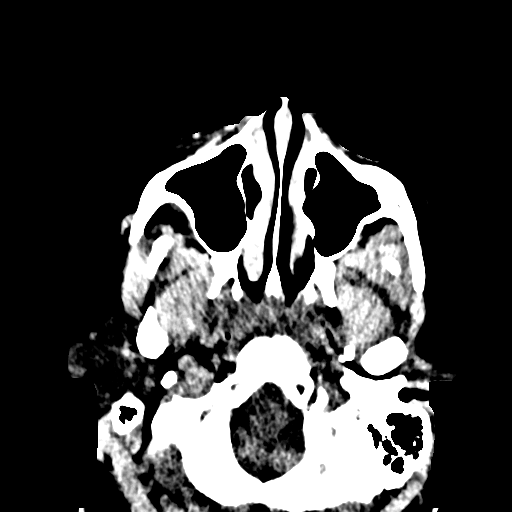
[im 47/73  brain]
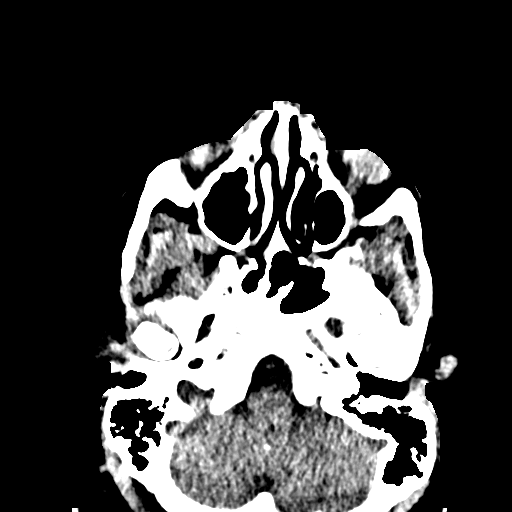
[im 57/73  brain]
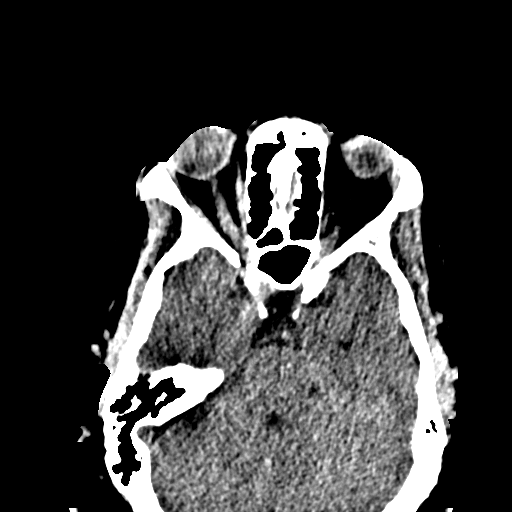
[im 67/73  brain]
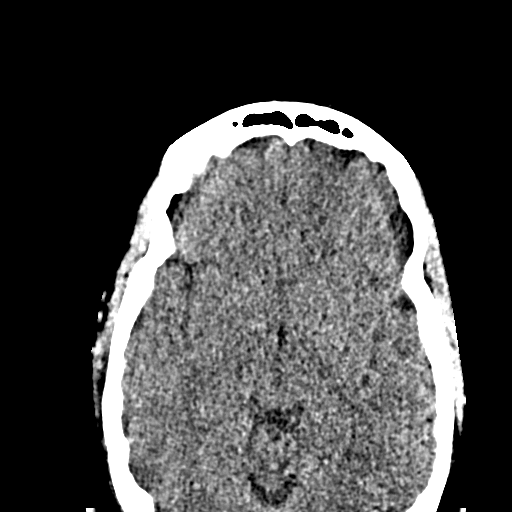

[Series 13: max soft · axial · 0.37mm/px · z∈[-224,-194]mm · 4 of 41 slices shown (2 of 2)]
[im 6/41  brain]
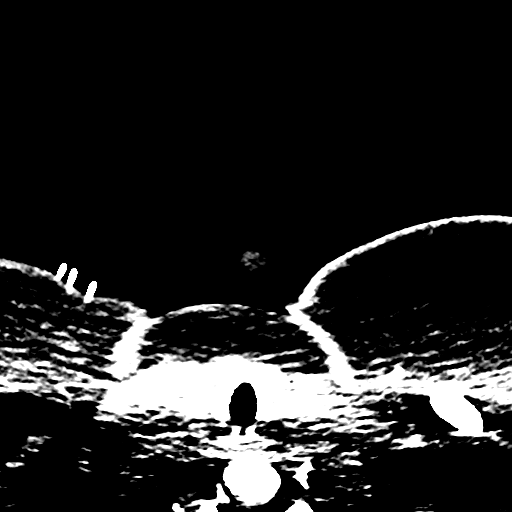
[im 11/41  brain]
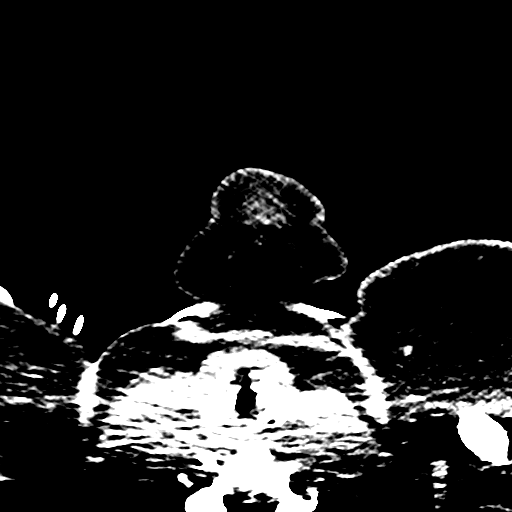
[im 16/41  brain]
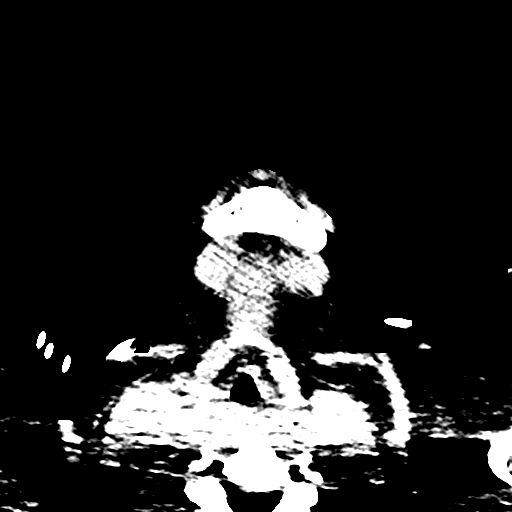
[im 21/41  brain]
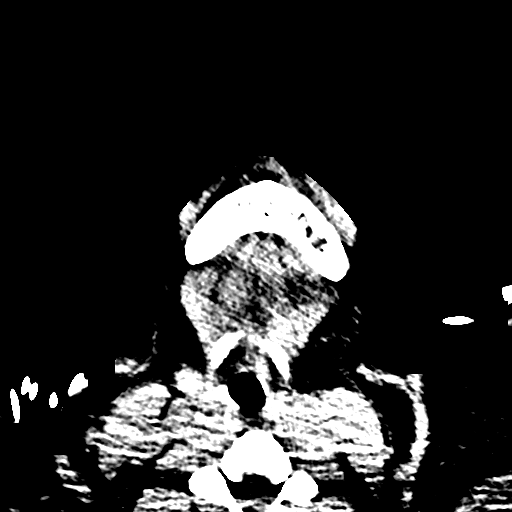

[Series 21: head wo · axial · 0.41mm/px · z∈[-91,-1]mm · 4 of 30 slices shown, 5 images]
[im 6/30  brain]
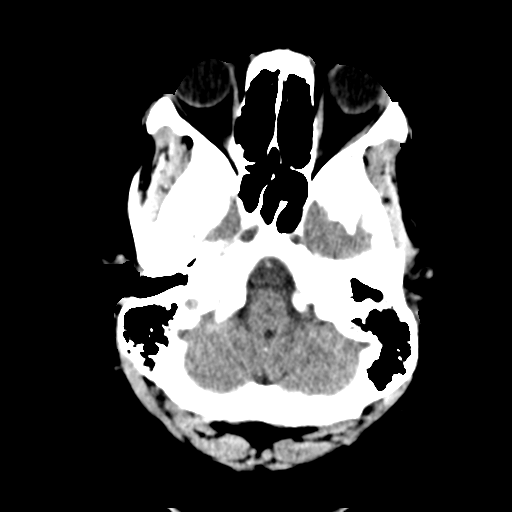
[im 6/30  bone]
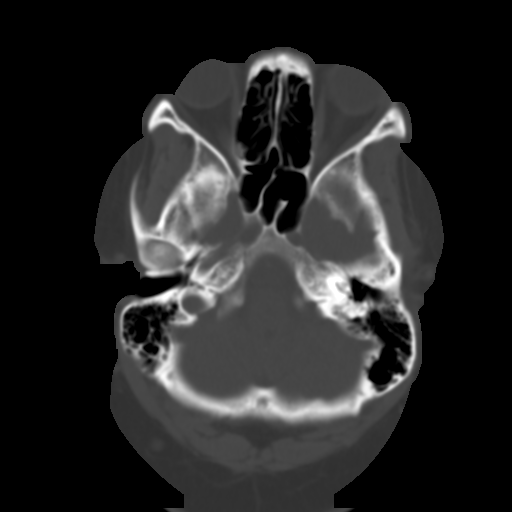
[im 12/30  bone]
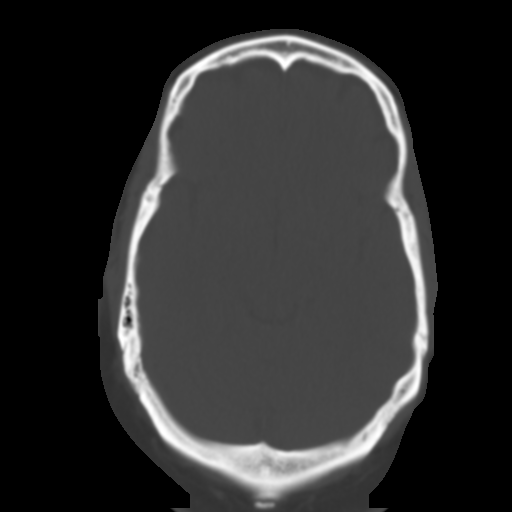
[im 18/30  bone]
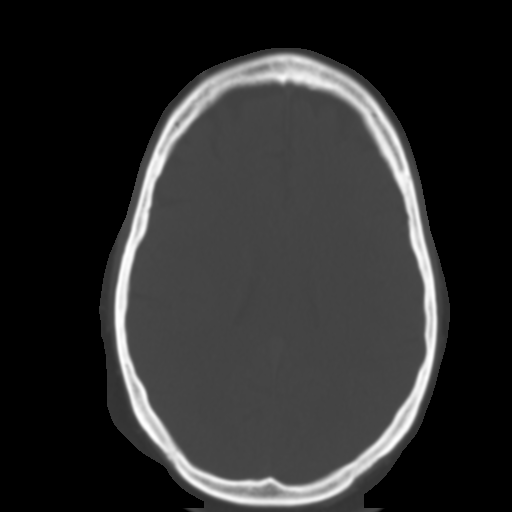
[im 24/30  bone]
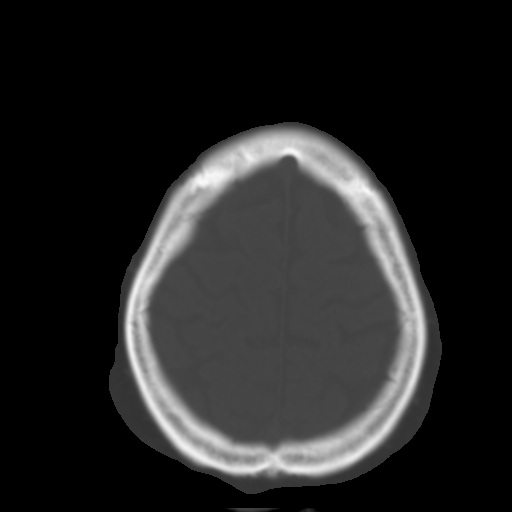

[17 of 47 positions shown; findings below may reference images not displayed]

FINDINGS: CT HEAD FINDINGS

Brain: No evidence for acute hemorrhage, mass lesion, midline shift,
hydrocephalus or large infarct.

Vascular: No hyperdense vessel or unexpected calcification.

Skull: Normal. Negative for fracture or focal lesion.

Other: None.

CT MAXILLOFACIAL FINDINGS

Osseous: No fracture. Mandibular condyles are located. No acute
abnormality in the upper cervical spine. Patient is missing multiple
teeth and there is evidence for multiple caries. In particular,
there is a remnant of the right lower second molar with periapical
lucency and a large caries. This is also a periapical lucency along
the posterior right maxilla related to a residual tooth fragment in
this area. Large caries associated with the right upper second
bicuspid.

Orbits: Negative. No traumatic or inflammatory finding.

Sinuses: Sinuses are clear.

Soft tissues: Soft tissue swelling in the right cheek and right
mandible region. Soft tissue swelling in this most prominent just
lateral to the right maxilla. Normal appearance of the globes. Small
nodular structures in both parotid glands probably represent parotid
lymph nodes. Submandibular glands are unremarkable.
IMPRESSION: Soft tissue swelling along the right side of the face with
underlying dental disease, particularly on the right side. Soft
swelling is suggestive for an inflammatory process which could be
odontogenic in etiology. No clear evidence for a soft tissue abscess
but limited evaluation without intravenous contrast.

No significant paranasal sinus disease.

No acute intracranial abnormality.

## 2017-08-24 IMAGING — MR MR SHOULDER*L* W/O CM
6 series · 40 of 40 positions shown · non-contrast
Comparison: None.

CLINICAL DATA: Left shoulder pain push up out of bed

EXAM:
MRI OF THE LEFT SHOULDER WITHOUT CONTRAST
TECHNIQUE: Multiplanar, multisequence MR imaging of the shoulder was performed.
No intravenous contrast was administered.

[Series 3: T2 fat-sat · axial · 4.0mm · 0.55mm/px · z∈[-56,+28]mm · 8 of 20 slices shown (1 of 4)]
[im 1/20]
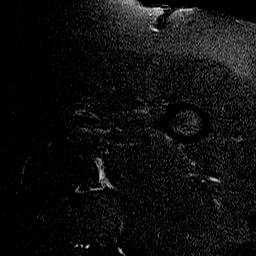
[im 3/20]
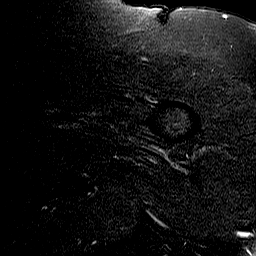
[im 6/20]
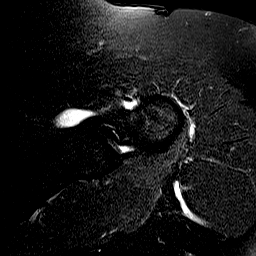
[im 9/20]
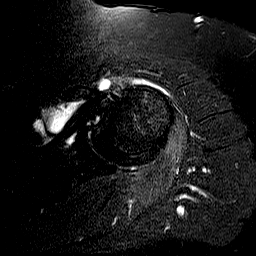
[im 11/20]
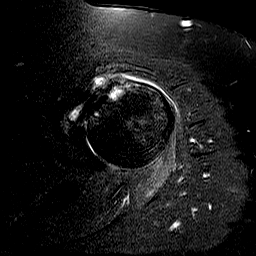
[im 14/20]
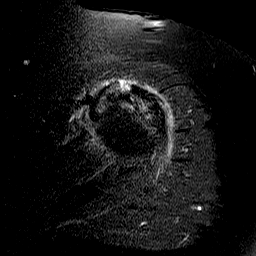
[im 17/20]
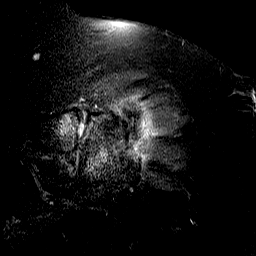
[im 20/20]
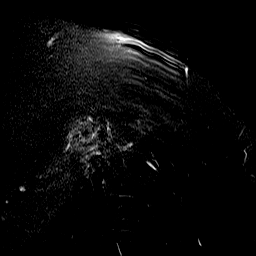

[Series 4: T2 fat-sat · coronal · 4.0mm · 0.55mm/px · 6 of 17 slices shown (2 of 4)]
[im 1/17]
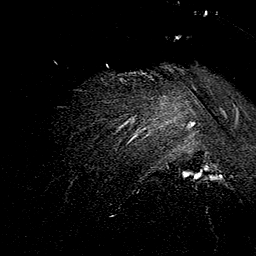
[im 4/17]
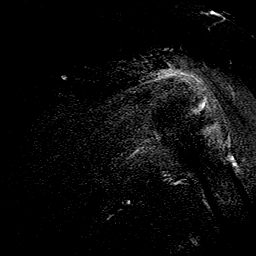
[im 7/17]
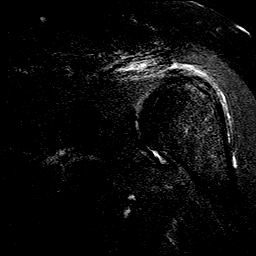
[im 10/17]
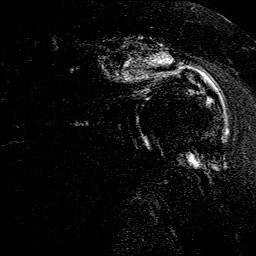
[im 13/17]
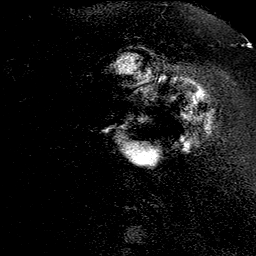
[im 17/17]
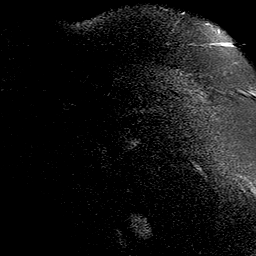

[Series 5: PD · coronal · 4.0mm · 0.55mm/px · 6 of 17 slices shown]
[im 1/17]
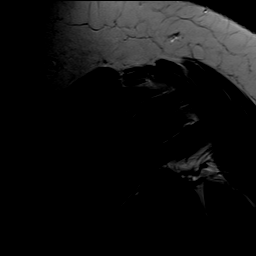
[im 4/17]
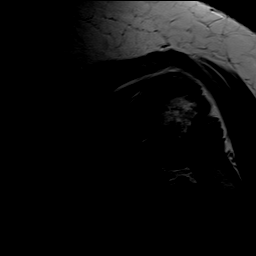
[im 7/17]
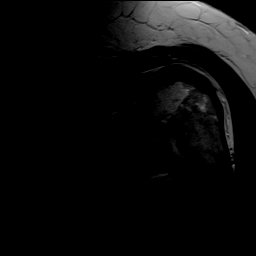
[im 10/17]
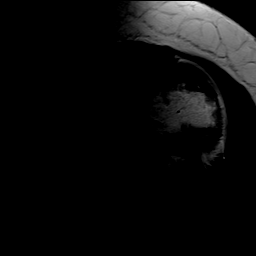
[im 13/17]
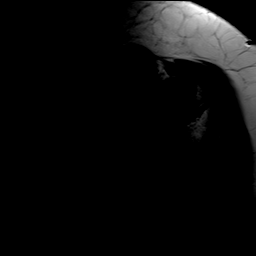
[im 17/17]
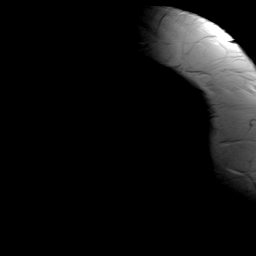

[Series 6: T1 · oblique · 4.0mm · 0.55mm/px · 7 of 20 slices shown]
[im 1/20]
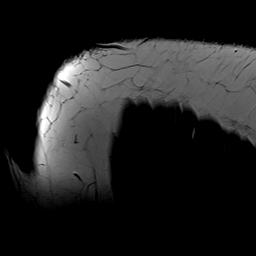
[im 4/20]
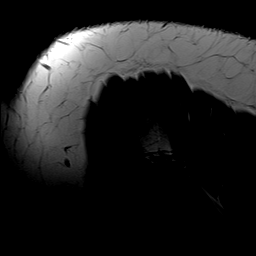
[im 7/20]
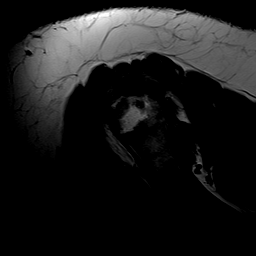
[im 10/20]
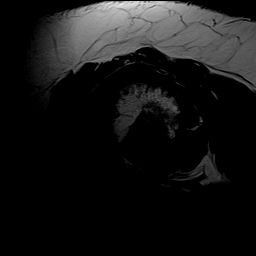
[im 13/20]
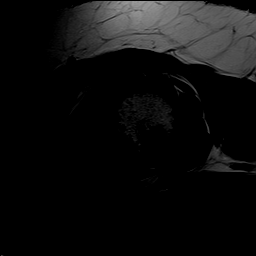
[im 16/20]
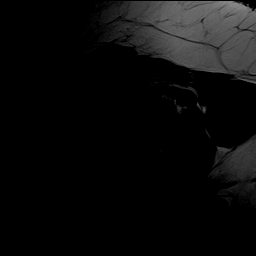
[im 20/20]
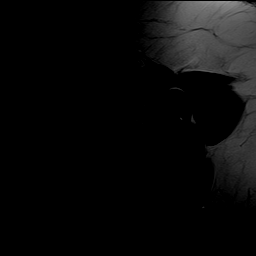

[Series 7: T2 fat-sat · oblique · 4.0mm · 0.55mm/px · 7 of 20 slices shown (3 of 4)]
[im 1/20]
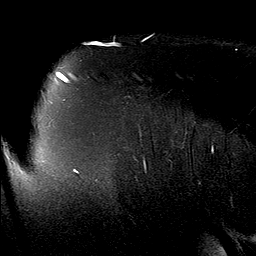
[im 4/20]
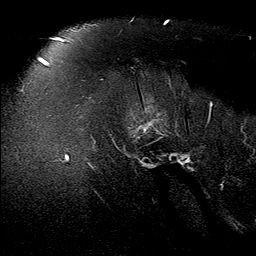
[im 7/20]
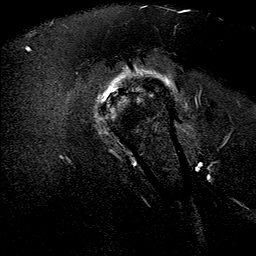
[im 10/20]
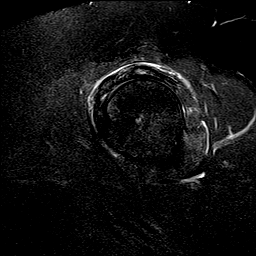
[im 13/20]
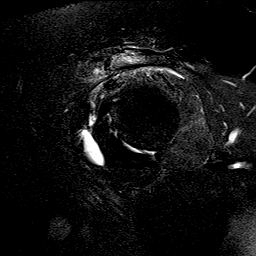
[im 16/20]
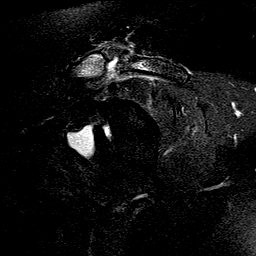
[im 20/20]
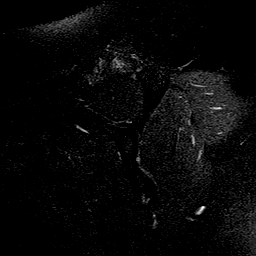

[Series 8: T2 fat-sat · coronal · 4.0mm · 0.62mm/px · 6 of 17 slices shown (4 of 4)]
[im 1/17]
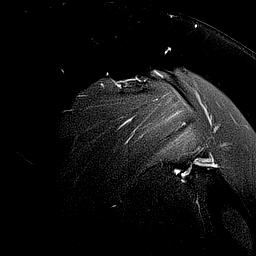
[im 4/17]
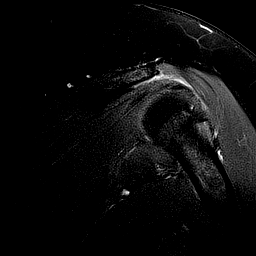
[im 7/17]
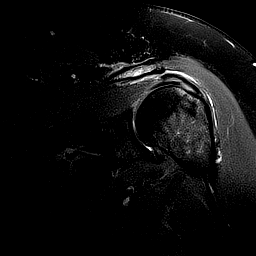
[im 10/17]
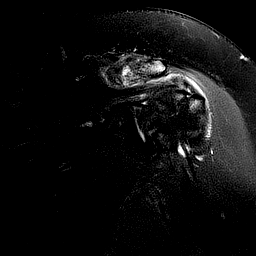
[im 13/17]
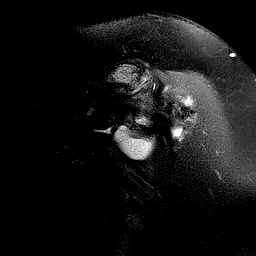
[im 17/17]
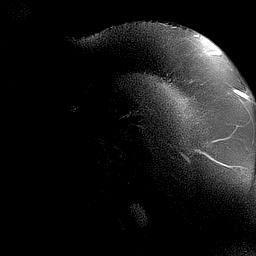

[40 of 40 positions shown; findings below may reference images not displayed]

FINDINGS: Rotator cuff: Severe tendinosis of the supraspinatus tendon with a
small partial-thickness bursal surface tear of the anterior fibers.
Possible small obliquely oriented full-thickness tear of the
supraspinatus tendon (image 9/series 8). Mild tendinosis of the
infraspinatus tendon. Teres minor tendon is intact. Subscapularis
tendon is intact.

Muscles: No atrophy or fatty replacement of nor abnormal signal
within, the muscles of the rotator cuff.

Biceps long head:  Intact.

Acromioclavicular Joint: Severe arthropathy of the acromioclavicular
joint with marrow edema on either side of the joint. Type II
acromion. Small amount of subacromial/subdeltoid bursal fluid.

Glenohumeral Joint: No joint effusion.  No chondral defect.

Labrum: Grossly intact, but evaluation is limited by lack of
intraarticular fluid.

Bones: No other marrow signal abnormality. No fracture or
dislocation.

Other: No fluid collection or hematoma.
IMPRESSION: 1. Severe tendinosis of the supraspinatus tendon with a small
partial-thickness bursal surface tear of the anterior fibers.
Possible small obliquely oriented full-thickness tear of the
supraspinatus tendon (image 9/series 8).
2. Mild tendinosis of the infraspinatus tendon.
3. Severe arthropathy of the acromioclavicular joint with marrow
edema on either side of the joint. Mild subacromial/subdeltoid
bursitis.

## 2017-10-21 ENCOUNTER — Other Ambulatory Visit: Payer: Self-pay | Admitting: Surgery

## 2017-10-21 DIAGNOSIS — M75122 Complete rotator cuff tear or rupture of left shoulder, not specified as traumatic: Secondary | ICD-10-CM

## 2017-10-21 DIAGNOSIS — M7581 Other shoulder lesions, right shoulder: Secondary | ICD-10-CM

## 2017-10-21 DIAGNOSIS — M25512 Pain in left shoulder: Secondary | ICD-10-CM

## 2017-10-21 DIAGNOSIS — M25511 Pain in right shoulder: Secondary | ICD-10-CM

## 2017-10-27 ENCOUNTER — Telehealth: Payer: Self-pay | Admitting: Surgery

## 2017-10-28 ENCOUNTER — Ambulatory Visit
Admission: RE | Admit: 2017-10-28 | Discharge: 2017-10-28 | Disposition: A | Discharge status: Home / Self Care | Payer: Medicaid Other | Source: Ambulatory Visit | Attending: Surgery | Admitting: Surgery

## 2017-10-28 ENCOUNTER — Ambulatory Visit: Payer: Medicaid Other

## 2017-11-22 ENCOUNTER — Encounter: Payer: Self-pay | Admitting: Emergency Medicine

## 2017-11-22 ENCOUNTER — Emergency Department
Admission: EM | Admit: 2017-11-22 | Discharge: 2017-11-22 | Disposition: A | Discharge status: Home / Self Care | Payer: Medicaid Other | Attending: Emergency Medicine | Admitting: Emergency Medicine

## 2017-11-22 ENCOUNTER — Other Ambulatory Visit: Payer: Self-pay

## 2017-11-22 DIAGNOSIS — G8929 Other chronic pain: Secondary | ICD-10-CM | POA: Diagnosis not present

## 2017-11-22 DIAGNOSIS — Z760 Encounter for issue of repeat prescription: Secondary | ICD-10-CM | POA: Diagnosis not present

## 2017-11-22 DIAGNOSIS — Z794 Long term (current) use of insulin: Secondary | ICD-10-CM | POA: Insufficient documentation

## 2017-11-22 DIAGNOSIS — J45909 Unspecified asthma, uncomplicated: Secondary | ICD-10-CM | POA: Diagnosis not present

## 2017-11-22 DIAGNOSIS — Z7982 Long term (current) use of aspirin: Secondary | ICD-10-CM | POA: Insufficient documentation

## 2017-11-22 DIAGNOSIS — M25512 Pain in left shoulder: Secondary | ICD-10-CM | POA: Insufficient documentation

## 2017-11-22 DIAGNOSIS — M25511 Pain in right shoulder: Secondary | ICD-10-CM | POA: Diagnosis not present

## 2017-11-22 DIAGNOSIS — I10 Essential (primary) hypertension: Secondary | ICD-10-CM | POA: Insufficient documentation

## 2017-11-22 DIAGNOSIS — Z79899 Other long term (current) drug therapy: Secondary | ICD-10-CM | POA: Diagnosis not present

## 2017-11-22 DIAGNOSIS — F17210 Nicotine dependence, cigarettes, uncomplicated: Secondary | ICD-10-CM | POA: Diagnosis not present

## 2017-11-22 DIAGNOSIS — E119 Type 2 diabetes mellitus without complications: Secondary | ICD-10-CM | POA: Diagnosis not present

## 2017-11-22 LAB — GLUCOSE, CAPILLARY: GLUCOSE-CAPILLARY: 312 mg/dL — AB (ref 65–99)

## 2017-11-22 MED ORDER — INSULIN GLARGINE 100 UNIT/ML ~~LOC~~ SOLN: 100.0000 [IU] | Freq: Every day | SUBCUTANEOUS | 5 refills | Status: DC

## 2017-11-22 MED ORDER — INSULIN LISPRO 100 UNIT/ML ~~LOC~~ SOLN: 20.0000 [IU] | Freq: Three times a day (TID) | SUBCUTANEOUS | 5 refills | Status: DC

## 2017-11-22 MED ORDER — ORPHENADRINE CITRATE ER 100 MG PO TB12: 100.0000 mg | ORAL_TABLET | Freq: Two times a day (BID) | ORAL | 0 refills | Status: DC | PRN

## 2017-11-22 MED ORDER — HYDROCODONE-ACETAMINOPHEN 5-325 MG PO TABS: 1.0000 | ORAL_TABLET | Freq: Three times a day (TID) | ORAL | 0 refills | Status: DC | PRN

## 2017-11-22 NOTE — ED Triage Notes (Signed)
Pt says she has been seeing dr Roland Rack for shoulder pain bilat.  Says this am she woke up with stiffness in both shoulders and neck.

## 2017-11-22 NOTE — Discharge Instructions (Signed)
Call Dr. Nicholaus Bloom office tomorrow for any additional medication. Also make an appointment with your primary care for continued insulin. Begin taking your insulin as you have been instructed in the past.

## 2017-11-22 NOTE — ED Notes (Signed)
See triage note  Presents with pain/stiffness to neck and both shoulders  States she sees Dr Roland Rack for possible rotator cuff problems  States her pain has increased today

## 2017-11-22 NOTE — ED Provider Notes (Signed)
[facility] Emergency Department Provider Note  ____________________________________________   First MD Initiated Contact with Patient 11/22/17 1504     (approximate)  I have reviewed the triage vital signs and the nursing notes.   HISTORY  Chief Complaint Shoulder Pain   HPI Brenda Hicks is a 52 y.o. female his complaint of bilateral shoulder pain. Patient states that she has been seeing Dr. Roland Rack for rotator cuff injuries and was scheduled for an MRI. She states that Medicaid has denied the MRI. She states that this morning she woke up with stiffness in both her shoulders and in her neck. She denies any recent injury and this is similar to the pain she has experienced in the past. She also would like to get a refill on her insulin as she does not have any refills and is in the process of switching her PCP to another doctor. Currently she rates her pain as 10 over 10.   Past Medical History:  Diagnosis Date  . Arthritis   . Asthma    WELL CONTROLLED  . Cancer of ear   . Diabetes mellitus without complication ([place])   . Hypertension   . Sleep apnea    USES CPAP    Patient Active Problem List   Diagnosis Date Noted  . Sepsis affecting skin ([place]) 06/09/2017  . Sebaceous cyst 06/01/2016  . Obstructive apnea 05/31/2016  . HLD (hyperlipidemia) 05/31/2016  . Acid reflux 05/31/2016  . Essential (primary) hypertension 05/31/2016  . Abnormal Pap smear of cervix 05/31/2016  . Arthritis of knee, degenerative 07/25/2015  . History of artificial joint 07/25/2015  . Gonalgia 02/17/2015  . Type 2 diabetes mellitus ([place]) 10/23/2014  . Otalgia of left ear 09/24/2014  . 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 Diabetic peripheral neuropathy associated with type 2 diabetes mellitus ([place]) 05/16/2014  . Endometrial polyp 11/01/2013  . Fibroids, intramural 10/09/2013  . Pain  due to knee joint prosthesis ([place]) 10/05/2013  . Body mass index (BMI) of 50-59.9 in adult ([place]) 10/03/2013  . Abnormal uterine bleeding 10/03/2013  . Adiposity 10/01/2013  . Excessive and frequent menstruation with irregular cycle 10/01/2013  . Adaptive colitis 10/01/2013  . History of migraine headaches 10/01/2013  . H/O malignant neoplasm of skin 10/01/2013  . Fatty liver disease, nonalcoholic 00/93/8182  . Diverticulitis 10/01/2013  . Current tobacco use 08/29/2013  . H/O total knee replacement 08/29/2013  . Cannot sleep 08/29/2013  . Dysmenorrhea 08/29/2013  . Airway hyperreactivity 08/29/2013  . Absolute anemia 08/29/2013  . Tobacco use 08/29/2013    Past Surgical History:  Procedure Laterality Date  . DILATION AND CURETTAGE OF UTERUS    . ENDOMETRIAL BIOPSY    . EXPLORATORY LAPAROTOMY     REMOVAL OF RUPTURED ECTOPIC  . HAND SURGERY    . HERNIA REPAIR     UMBILICAL  . JOINT REPLACEMENT Right    TKR  . KNEE ARTHROSCOPY    . KNEE SURGERY Right   . SHOULDER ARTHROSCOPY WITH BICEPSTENOTOMY Left 12/14/2016   Procedure: SHOULDER ARTHROSCOPY WITH BICEPSTENOTOMY;  Surgeon: Corky Mull, MD;  Location: [facility] ORS;  Service: Orthopedics;  Laterality: Left;  . SHOULDER ARTHROSCOPY WITH OPEN ROTATOR CUFF REPAIR Left 12/14/2016   Procedure: SHOULDER ARTHROSCOPY WITH OPEN ROTATOR CUFF REPAIR AND ARTHROSCOPIC ROTATOR CUFF REPAIR;  Surgeon: Corky Mull, MD;  Location: [facility] ORS;  Service: Orthopedics;  Laterality: Left;  . SHOULDER ARTHROSCOPY WITH  SUBACROMIAL DECOMPRESSION Left 12/14/2016   Procedure: SHOULDER ARTHROSCOPY WITH SUBACROMIAL DECOMPRESSION;  Surgeon: Corky Mull, MD;  Location: [facility] ORS;  Service: Orthopedics;  Laterality: Left;  . TUBAL LIGATION      Prior to Admission medications   Medication Sig Start Date End Date Taking? Authorizing Provider  albuterol (PROVENTIL HFA;VENTOLIN HFA) 108 (90 Base) MCG/ACT inhaler Inhale 2 puffs into the lungs every 6 (six) hours as needed  for wheezing or shortness of breath.    [provider]  aspirin EC 81 MG tablet Take 81 mg by mouth daily.  01/14/16   [provider]  atorvastatin (LIPITOR) 40 MG tablet Take 1 tablet (40 mg total) by mouth daily. 12/07/15 06/09/17  Johnn Hai, PA-C  doxycycline (VIBRA-TABS) 100 MG tablet Take 1 tablet (100 mg total) by mouth every 12 (twelve) hours. 06/15/17   Hillary Bow, MD  gabapentin (NEURONTIN) 300 MG capsule Take 300 mg by mouth 3 (three) times daily.     [provider]  HYDROcodone-acetaminophen (NORCO/VICODIN) 5-325 MG tablet Take 1 tablet by mouth every 8 (eight) hours as needed for moderate pain. 11/22/17   Johnn Hai, PA-C  insulin glargine (LANTUS) 100 UNIT/ML injection Inject 1 mL (100 Units total) into the skin at bedtime. 11/22/17   Johnn Hai, PA-C  insulin lispro (HUMALOG) 100 UNIT/ML injection Inject 0.2 mLs (20 Units total) into the skin 3 (three) times daily with meals. 11/22/17 04/18/19  Johnn Hai, PA-C  levonorgestrel (MIRENA) 20 MCG/24HR IUD 1 each by Intrauterine route once.    [provider]  lisinopril (PRINIVIL,ZESTRIL) 40 MG tablet Take 40 mg by mouth every evening.  01/14/16 01/13/17  [provider]  orphenadrine (NORFLEX) 100 MG tablet Take 1 tablet (100 mg total) by mouth 2 (two) times daily as needed for muscle spasms. 11/22/17   Johnn Hai, PA-C  sitaGLIPtin (JANUVIA) 100 MG tablet Take 100 mg by mouth daily. Patient reports taking Januvia 100 mg daily    [provider]  zolpidem (AMBIEN) 5 MG tablet Take 1 tablet (5 mg total) by mouth at bedtime as needed for sleep. 12/14/16   Poggi, Marshall Cork, MD    Allergies Patient has no known allergies.  No family history on file.  Social History Social History   Tobacco Use  . Smoking status: Current Every Day Smoker    Packs/day: 0.50    Years: 25.00    Pack years: 12.50    Types: Cigarettes  . Smokeless tobacco: Never Used    Substance Use Topics  . Alcohol use: No  . Drug use: No    Review of Systems Constitutional: No fever/chills Cardiovascular: Denies chest pain. Respiratory: Denies shortness of breath. Gastrointestinal:  No nausea, no vomiting.   Musculoskeletal: bilateral shoulder pain and cervical pain. Skin: Negative for rash. Neurological: Negative for headaches, focal weakness or numbness. Endocrine: positive diabetes mellitus. ____________________________________________   PHYSICAL EXAM:  VITAL SIGNS: ED Triage Vitals  Enc Vitals Group     BP 11/22/17 1438 (!) 166/92     Pulse Rate 11/22/17 1438 91     Resp --      Temp 11/22/17 1438 98 F (36.7 C)     Temp Source 11/22/17 1438 Oral     SpO2 11/22/17 1438 96 %     Weight 11/22/17 1440 (!) 334 lb (151.5 kg)     Height 11/22/17 1440 5\' 5"  (1.651 m)     Head Circumference --  Peak Flow --      Pain Score 11/22/17 1430 10     Pain Loc --      Pain Edu? --      Excl. in [place]? --     Constitutional: Alert and oriented. Well appearing and in no acute distress. Eyes: Conjunctivae are normal.  Head: Atraumatic. Nose: No congestion/rhinnorhea. Neck: No stridor.  Remembered diffuse tenderness on palpation posteriorly. Range of motion is restricted secondary to discomfort. No evidence of injury. Cardiovascular: Normal rate, regular rhythm. Grossly normal heart sounds.  Good peripheral circulation. Respiratory: Normal respiratory effort.  No retractions. Lungs CTAB. Musculoskeletal: there is marked tenderness on palpation of bilateral shoulders anterior lateral aspect. No gross deformity or evidence of recent injury. No soft tissue swelling. Range of motion is restricted secondary to increased pain. Motor sensory function intact distal to patient's pain. Neurologic:  Normal speech and language. No gross focal neurologic deficits are appreciated.  Skin:  Skin is warm, dry and intact. No ecchymosis, abrasions, erythema  present. Psychiatric: Mood and affect are normal. Speech and behavior are normal.  ____________________________________________   LABS (all labs ordered are listed, but only abnormal results are displayed)  Labs Reviewed  GLUCOSE, CAPILLARY - Abnormal; Notable for the following components:      Result Value   Glucose-Capillary 312 (*)    All other components within normal limits  CBG MONITORING, ED    PROCEDURES  Procedure(s) performed: None  Procedures  Critical Care performed: No  ____________________________________________   INITIAL IMPRESSION / ASSESSMENT AND PLAN / ED COURSE Patient has been out of her insulin for 30 days due to lack of refills. Glucose was 312 nonfasting. Patient was given a prescription for her humalog, and lantus.  Patient was also given Norflex 100 mg 1 twice a day when necessary and Norco one every 8 hours until she is able to call Dr. Nicholaus Bloom office tomorrow for further instructions. Patient was given refills on her insulin and encouraged to make an appointment with her new PCP.  ____________________________________________   FINAL CLINICAL IMPRESSION(S) / ED DIAGNOSES  Final diagnoses:  Chronic pain of both shoulders  Encounter for medication refill     ED Discharge Orders        Ordered    insulin lispro (HUMALOG) 100 UNIT/ML injection  3 times daily with meals     11/22/17 1611    insulin glargine (LANTUS) 100 UNIT/ML injection  Daily at bedtime     11/22/17 1611    orphenadrine (NORFLEX) 100 MG tablet  2 times daily PRN     11/22/17 1611    HYDROcodone-acetaminophen (NORCO/VICODIN) 5-325 MG tablet  Every 8 hours PRN     11/22/17 1611       Note:  This document was prepared using Dragon voice recognition software and may include unintentional dictation errors.    Johnn Hai, PA-C 11/22/17 1640    Harvest Dark, MD 11/23/17 2157

## 2018-01-09 IMAGING — CT CT ABD-PELV W/ CM
2 of 5 series · 15 of 46 positions shown, 17 images · IV contrast (APPLIED)
Comparison: CT of the abdomen and pelvis performed 07/09/2013, and
right upper quadrant ultrasound performed earlier today at [DATE] a.m.

CLINICAL DATA: Acute onset of right upper quadrant and right lower
quadrant abdominal pain. Vomiting. Initial encounter.

EXAM:
CT ABDOMEN AND PELVIS WITH CONTRAST
TECHNIQUE: Multidetector CT imaging of the abdomen and pelvis was performed
using the standard protocol following bolus administration of
intravenous contrast.
CONTRAST:  100mL LVO52X-WTT IOPAMIDOL (LVO52X-WTT) INJECTION 61%

[Series 2: routine abd/pel with · axial · 0.78mm/px · z∈[-1233,-753]mm · 12 of 108 slices shown, 14 images]
[im 6/108  soft-tissue]
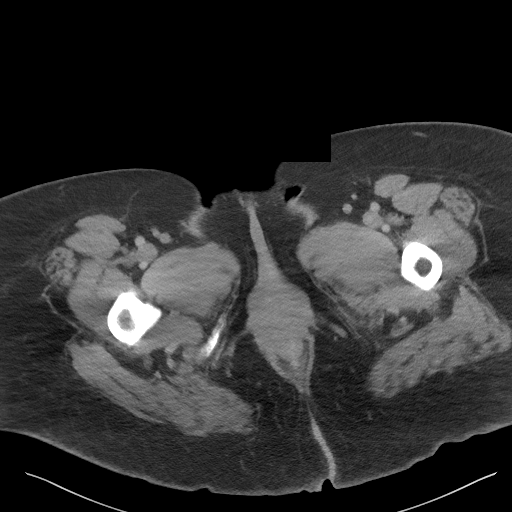
[im 6/108  bone]
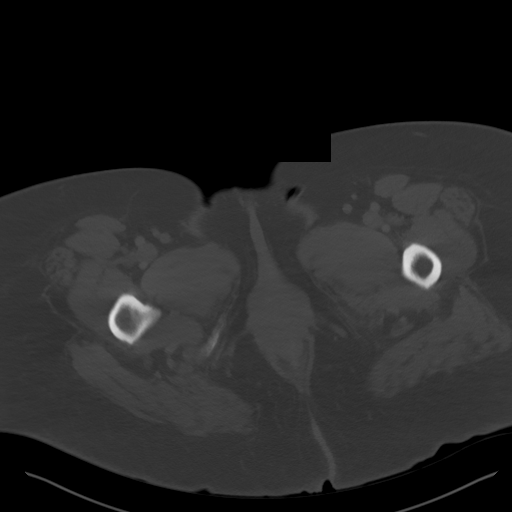
[im 18/108  soft-tissue]
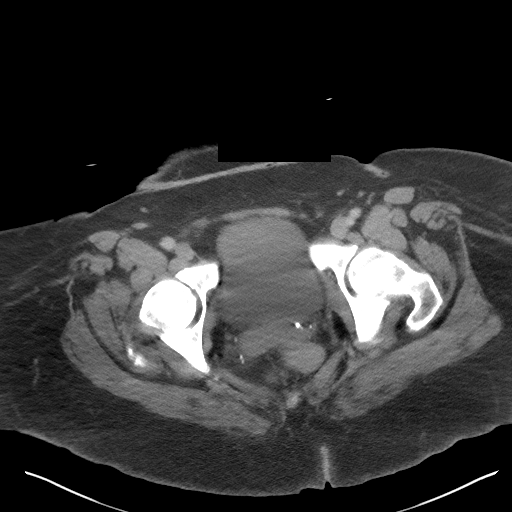
[im 24/108  soft-tissue]
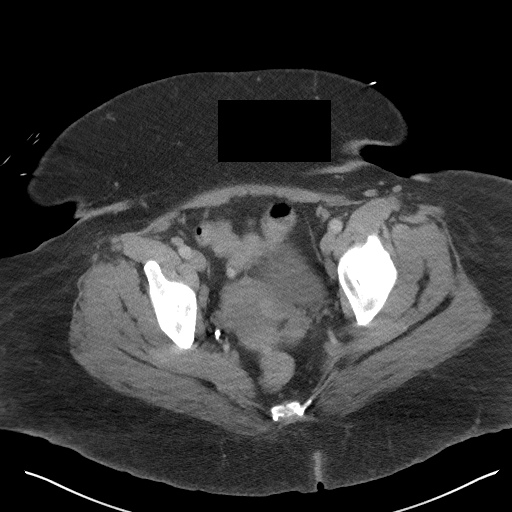
[im 30/108  soft-tissue]
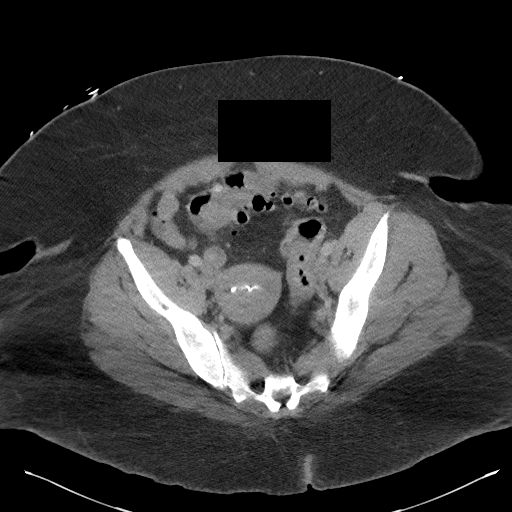
[im 42/108  soft-tissue]
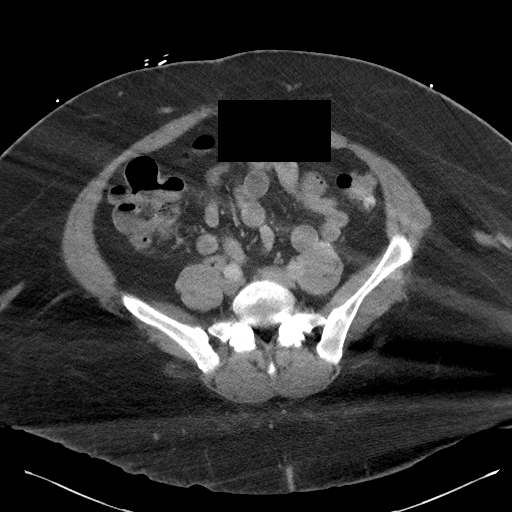
[im 48/108  soft-tissue]
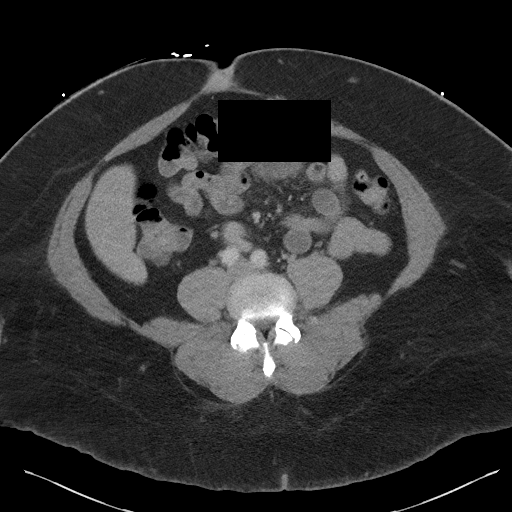
[im 60/108  soft-tissue]
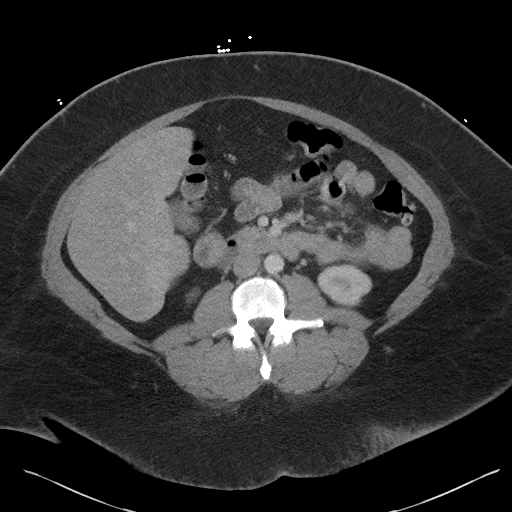
[im 66/108  soft-tissue]
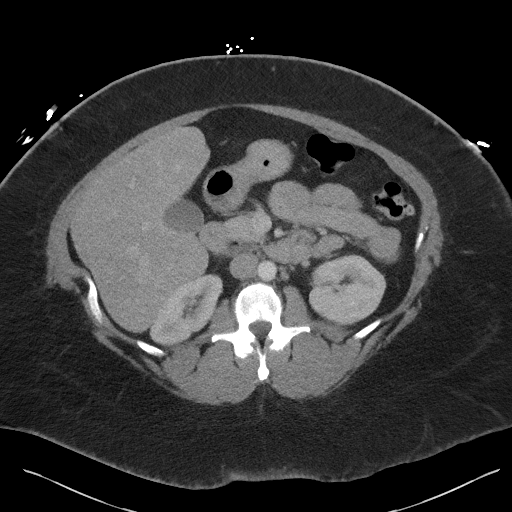
[im 78/108  soft-tissue]
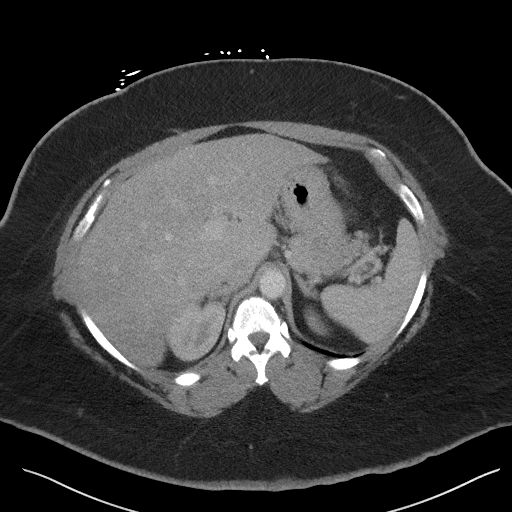
[im 78/108  bone]
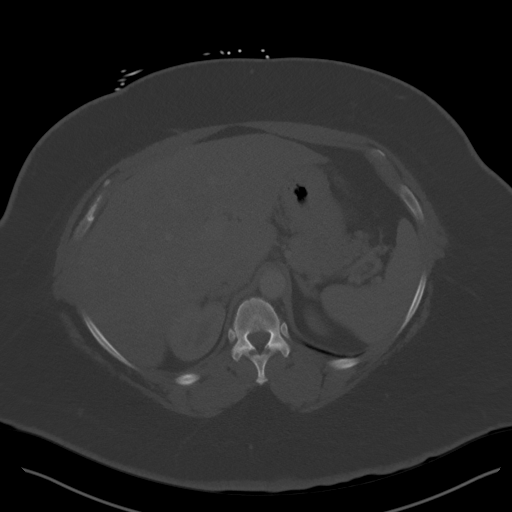
[im 84/108  soft-tissue]
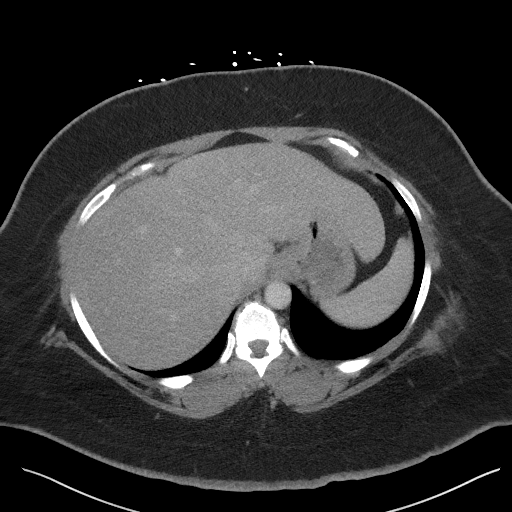
[im 90/108  soft-tissue]
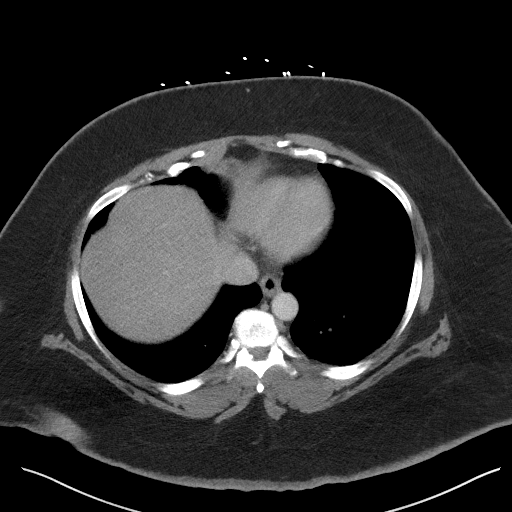
[im 102/108  soft-tissue]
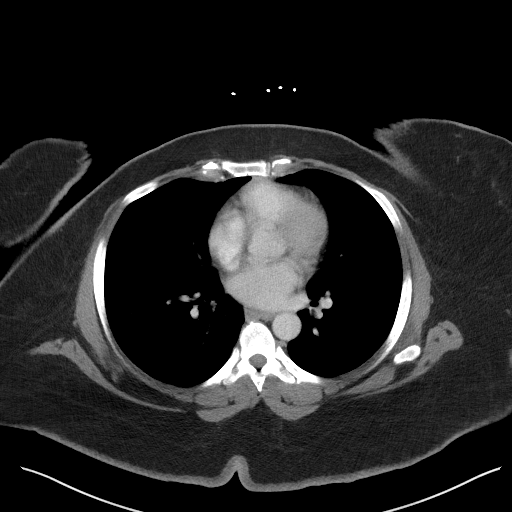

[Series 5: coronal st · coronal · 0.82mm/px · 3 of 111 slices shown]
[im 37/111  soft-tissue]
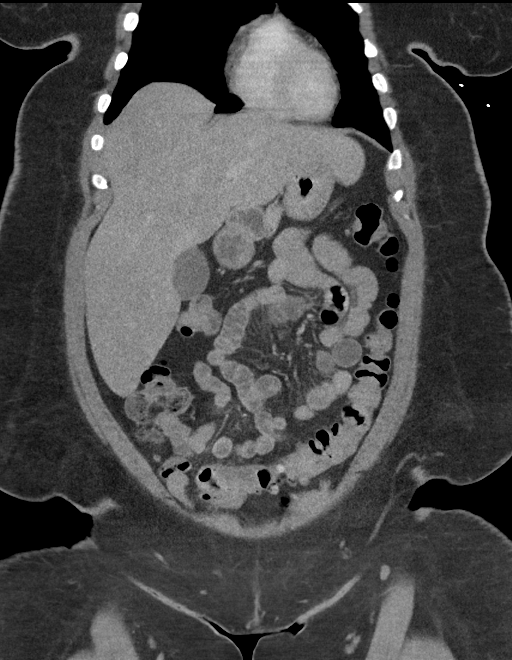
[im 49/111  soft-tissue]
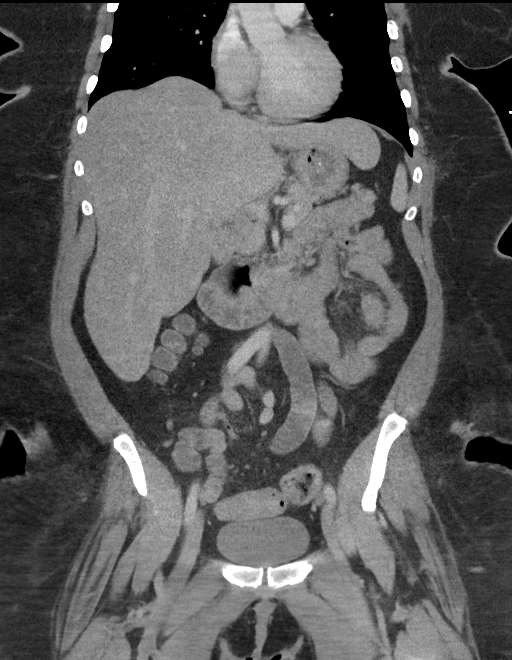
[im 62/111  soft-tissue]
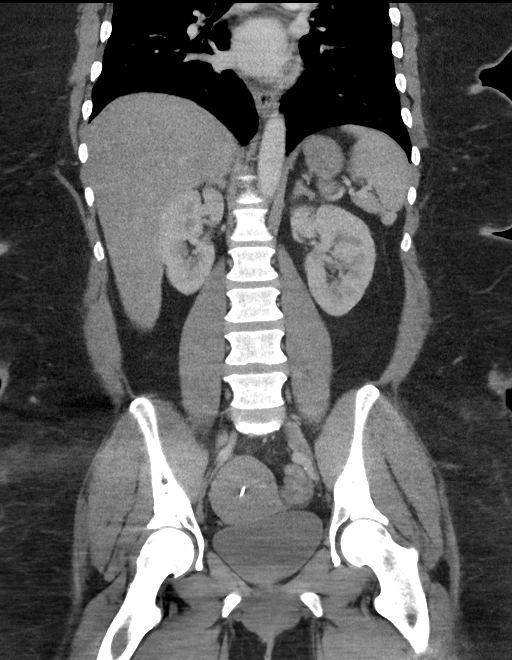

[15 of 46 positions shown; findings below may reference images not displayed]

FINDINGS: Lower chest: Minimal left basilar atelectasis is noted. The
visualized portions of the mediastinum are unremarkable.

Hepatobiliary: There is diffuse fatty inflation within the liver,
with mild sparing about the gallbladder fossa. The gallbladder is
unremarkable in appearance. The common bile duct remains normal in
caliber.

Pancreas: Incidental note is made of a duodenal diverticulum at the
pancreatic head. The pancreas is otherwise unremarkable in
appearance.

Spleen: The spleen is unremarkable in appearance.

Adrenals/Urinary Tract: The adrenal glands are unremarkable in
appearance. The kidneys are within normal limits. There is no
evidence of hydronephrosis. No renal or ureteral stones are
identified. No perinephric stranding is seen.

Stomach/Bowel: The stomach is unremarkable in appearance. The small
bowel is within normal limits. The appendix is normal in caliber,
without evidence of appendicitis.

Mild diverticulosis is noted along the descending and proximal
sigmoid colon.

There appears to be focal wall thickening at the mid sigmoid colon,
with a few nearby lymph nodes measuring up to 6 mm in short axis.
Sigmoidoscopy is recommended to exclude underlying malignancy.

Vascular/Lymphatic: The abdominal aorta is unremarkable in
appearance. The inferior vena cava is grossly unremarkable. No
retroperitoneal lymphadenopathy is seen. No pelvic sidewall
lymphadenopathy is identified.

Reproductive: The bladder is mildly distended and within normal
limits. A likely small left uterine fibroid is noted. The patient's
intrauterine device is noted in expected position at the fundus of
the uterus. The ovaries are relatively symmetric. No suspicious
adnexal masses are seen.

Other: No additional soft tissue abnormalities are seen.

Musculoskeletal: No acute osseous abnormalities are identified. The
visualized musculature is unremarkable in appearance.
IMPRESSION: 1. Question of focal wall thickening at the mid sigmoid colon, with
a few nearby mildly prominent lymph nodes measuring up to 6 mm in
short axis. Sigmoidoscopy is recommended to exclude underlying
malignancy, when and as deemed clinically appropriate.
2. Mild diverticulosis along the descending and proximal sigmoid
colon. Given question of soft tissue inflammation at the mid sigmoid
colon, mild recurrent acute diverticulitis could have a similar
appearance. Would correlate with the patient's symptoms.
3. Diffuse fatty infiltration within the liver.
4. Incidental note of a duodenal diverticulum at the pancreatic
head.

## 2018-01-23 IMAGING — MR MR SHOULDER*L* W/O CM
5 series · 40 of 40 positions shown · non-contrast
Comparison: 11/11/2016

CLINICAL DATA: History rotator cuff tear. Prior surgery November 2016. Decreased range of motion since [REDACTED]. Weakness. Not
improving. No known injury.

EXAM:
MRI OF THE LEFT SHOULDER WITHOUT CONTRAST
TECHNIQUE: Multiplanar, multisequence MR imaging of the shoulder was performed.
No intravenous contrast was administered.

[Series 3: T2 fat-sat · axial · 4.0mm · 0.59mm/px · z∈[-52,+36]mm · 8 of 21 slices shown (1 of 3)]
[im 1/21]
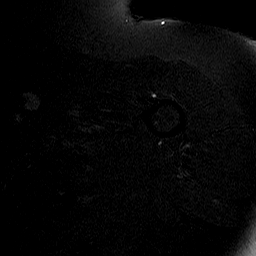
[im 3/21]
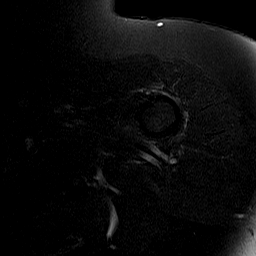
[im 6/21]
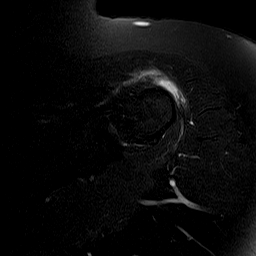
[im 9/21]
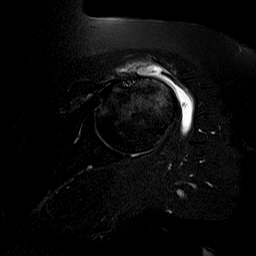
[im 12/21]
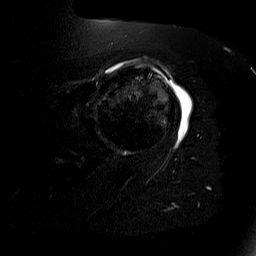
[im 15/21]
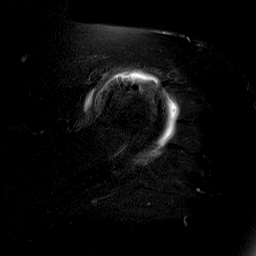
[im 18/21]
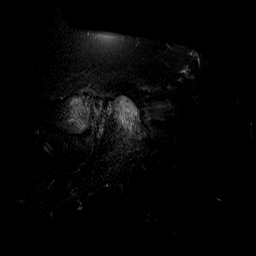
[im 21/21]
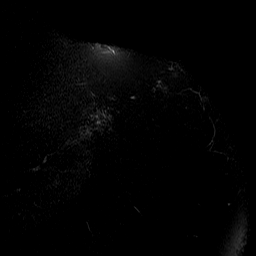

[Series 4: T2 fat-sat · oblique · 4.0mm · 0.59mm/px · 8 of 19 slices shown (2 of 3)]
[im 1/19]
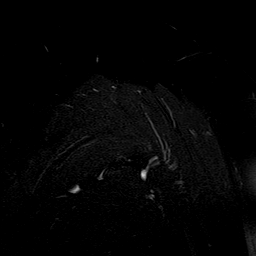
[im 3/19]
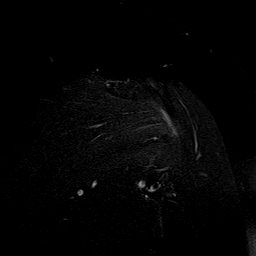
[im 6/19]
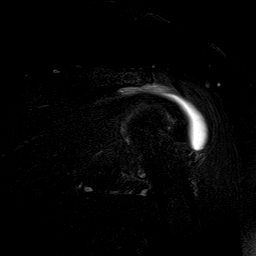
[im 8/19]
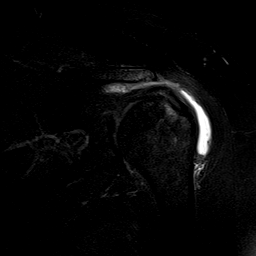
[im 11/19]
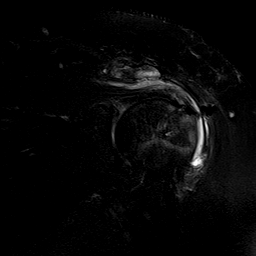
[im 13/19]
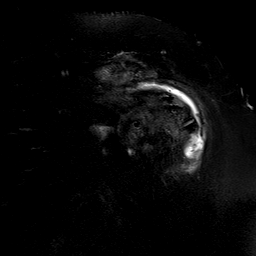
[im 16/19]
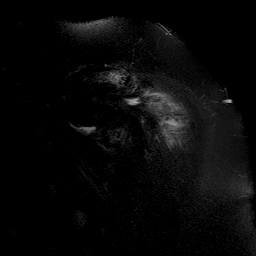
[im 19/19]
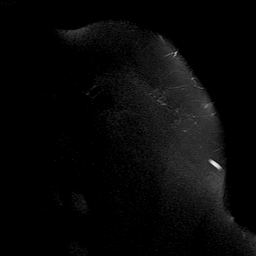

[Series 5: PD · oblique · 4.0mm · 0.59mm/px · 8 of 19 slices shown]
[im 1/19]
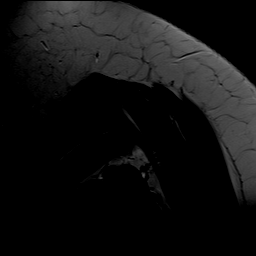
[im 3/19]
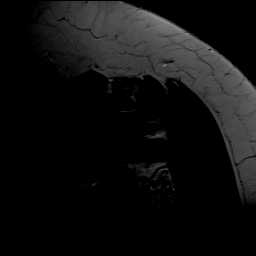
[im 6/19]
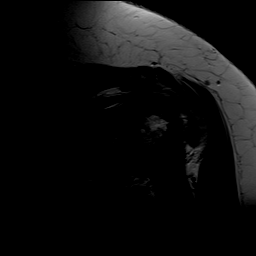
[im 8/19]
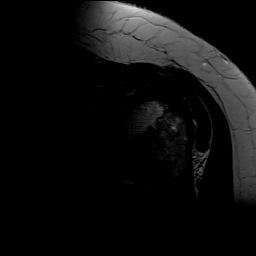
[im 11/19]
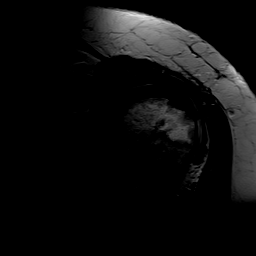
[im 13/19]
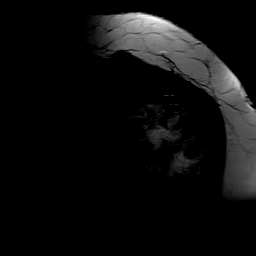
[im 16/19]
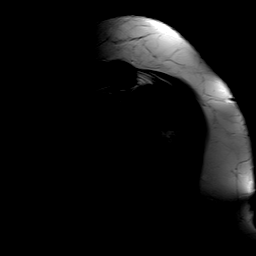
[im 19/19]
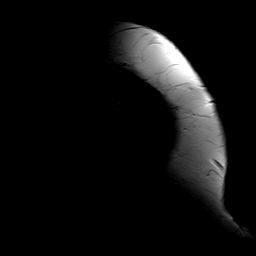

[Series 6: T1 · sagittal · 4.0mm · 0.59mm/px · 8 of 21 slices shown]
[im 1/21]
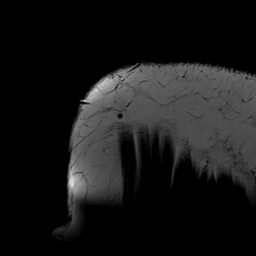
[im 3/21]
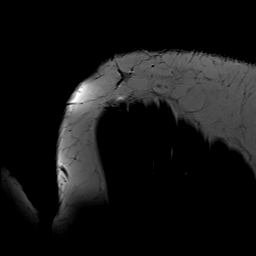
[im 6/21]
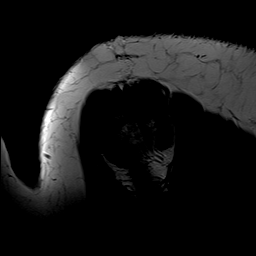
[im 9/21]
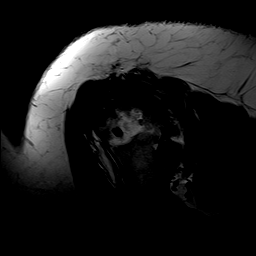
[im 12/21]
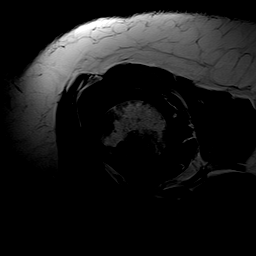
[im 15/21]
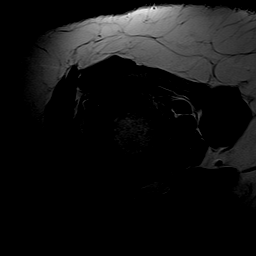
[im 18/21]
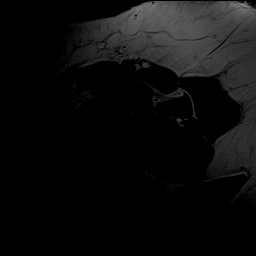
[im 21/21]
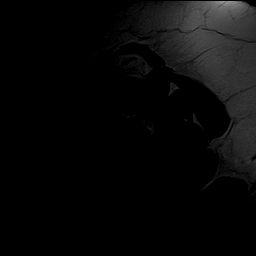

[Series 7: T2 fat-sat · sagittal · 4.0mm · 0.59mm/px · 8 of 21 slices shown (3 of 3)]
[im 1/21]
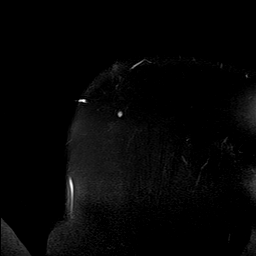
[im 3/21]
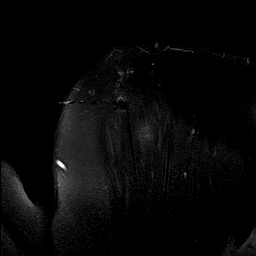
[im 6/21]
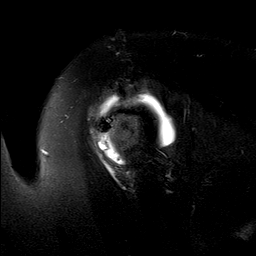
[im 9/21]
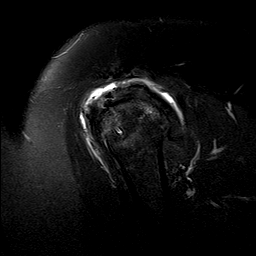
[im 12/21]
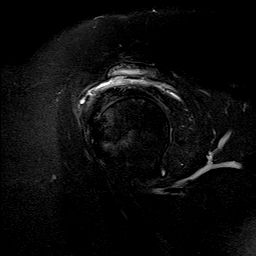
[im 15/21]
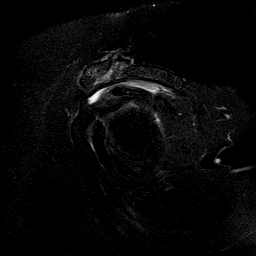
[im 18/21]
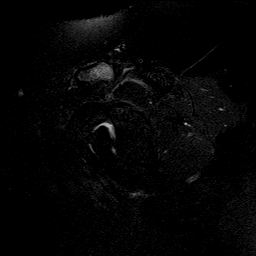
[im 21/21]
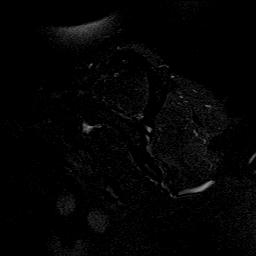

[40 of 40 positions shown; findings below may reference images not displayed]

FINDINGS: Rotator cuff: Prior rotator cuff repair. Moderate tendinosis of the
supraspinatus tendon with a small high-grade partial-thickness
bursal surface tear. Moderate tendinosis of the anterior half of the
infraspinatus tendon. Teres minor tendon is intact. Subscapularis
tendon is intact.

Muscles: No atrophy or fatty replacement of nor abnormal signal
within, the muscles of the rotator cuff.

Biceps long head:  Interval tenotomy.

Acromioclavicular Joint: Prior subacromial decompression. Marrow
edema on either side of the acromioclavicular joint. Type II
acromion. Large amount of subacromial/ subdeltoid bursal fluid.

Glenohumeral Joint: No significant joint effusion. Mild
partial-thickness cartilage loss of the glenohumeral joint.

Labrum: Grossly intact, but evaluation is limited by lack of
intraarticular fluid.

Bones: Subcortical reactive marrow edema in the superolateral
humeral head. No fracture or dislocation.

Other: No fluid collection or hematoma.
IMPRESSION: 1. Prior rotator cuff repair. Moderate tendinosis of the
supraspinatus tendon with a small high-grade partial-thickness
bursal surface tear. Moderate tendinosis of the anterior half of the
infraspinatus tendon.
2. Severe subacromial/subdeltoid bursitis.

## 2018-01-30 ENCOUNTER — Emergency Department: Payer: Medicaid Other

## 2018-01-30 ENCOUNTER — Emergency Department
Admission: EM | Admit: 2018-01-30 | Discharge: 2018-01-30 | Disposition: A | Discharge status: Home / Self Care | Payer: Medicaid Other | Attending: Emergency Medicine | Admitting: Emergency Medicine

## 2018-01-30 ENCOUNTER — Other Ambulatory Visit: Payer: Self-pay

## 2018-01-30 ENCOUNTER — Encounter: Payer: Self-pay | Admitting: Emergency Medicine

## 2018-01-30 DIAGNOSIS — E1140 Type 2 diabetes mellitus with diabetic neuropathy, unspecified: Secondary | ICD-10-CM | POA: Insufficient documentation

## 2018-01-30 DIAGNOSIS — R300 Dysuria: Secondary | ICD-10-CM | POA: Insufficient documentation

## 2018-01-30 DIAGNOSIS — Z85828 Personal history of other malignant neoplasm of skin: Secondary | ICD-10-CM | POA: Insufficient documentation

## 2018-01-30 DIAGNOSIS — Z79899 Other long term (current) drug therapy: Secondary | ICD-10-CM | POA: Insufficient documentation

## 2018-01-30 DIAGNOSIS — M79662 Pain in left lower leg: Secondary | ICD-10-CM | POA: Insufficient documentation

## 2018-01-30 DIAGNOSIS — M79605 Pain in left leg: Secondary | ICD-10-CM | POA: Diagnosis not present

## 2018-01-30 DIAGNOSIS — J45909 Unspecified asthma, uncomplicated: Secondary | ICD-10-CM | POA: Diagnosis not present

## 2018-01-30 DIAGNOSIS — Z96651 Presence of right artificial knee joint: Secondary | ICD-10-CM | POA: Diagnosis not present

## 2018-01-30 DIAGNOSIS — R0602 Shortness of breath: Secondary | ICD-10-CM | POA: Diagnosis not present

## 2018-01-30 DIAGNOSIS — M79661 Pain in right lower leg: Secondary | ICD-10-CM | POA: Insufficient documentation

## 2018-01-30 DIAGNOSIS — E1165 Type 2 diabetes mellitus with hyperglycemia: Secondary | ICD-10-CM | POA: Insufficient documentation

## 2018-01-30 DIAGNOSIS — F17210 Nicotine dependence, cigarettes, uncomplicated: Secondary | ICD-10-CM | POA: Insufficient documentation

## 2018-01-30 DIAGNOSIS — I10 Essential (primary) hypertension: Secondary | ICD-10-CM | POA: Insufficient documentation

## 2018-01-30 DIAGNOSIS — M79604 Pain in right leg: Secondary | ICD-10-CM | POA: Insufficient documentation

## 2018-01-30 DIAGNOSIS — M25561 Pain in right knee: Secondary | ICD-10-CM | POA: Insufficient documentation

## 2018-01-30 DIAGNOSIS — R600 Localized edema: Secondary | ICD-10-CM | POA: Diagnosis not present

## 2018-01-30 DIAGNOSIS — Z7982 Long term (current) use of aspirin: Secondary | ICD-10-CM | POA: Diagnosis not present

## 2018-01-30 DIAGNOSIS — Z794 Long term (current) use of insulin: Secondary | ICD-10-CM | POA: Insufficient documentation

## 2018-01-30 DIAGNOSIS — R739 Hyperglycemia, unspecified: Secondary | ICD-10-CM

## 2018-01-30 DIAGNOSIS — R609 Edema, unspecified: Secondary | ICD-10-CM

## 2018-01-30 HISTORY — DX: Disorder of kidney and ureter, unspecified: N28.9

## 2018-01-30 HISTORY — DX: Fatty (change of) liver, not elsewhere classified: K76.0

## 2018-01-30 HISTORY — DX: Cyst of kidney, acquired: N28.1

## 2018-01-30 LAB — URINALYSIS, COMPLETE (UACMP) WITH MICROSCOPIC
BILIRUBIN URINE: NEGATIVE
Glucose, UA: >=500 mg/dL — AB
Hgb urine dipstick: NEGATIVE
Ketones, ur: NEGATIVE mg/dL
LEUKOCYTES UA: NEGATIVE
NITRITE: NEGATIVE
PH: 5 (ref 5.0–8.0)
Protein, ur: NEGATIVE mg/dL
Specific Gravity, Urine: 1.021 (ref 1.005–1.030)

## 2018-01-30 LAB — COMPREHENSIVE METABOLIC PANEL WITH GFR
ALT: 53 U/L (ref 14–54)
AST: 34 U/L (ref 15–41)
Albumin: 4.2 g/dL (ref 3.5–5.0)
Alkaline Phosphatase: 99 U/L (ref 38–126)
Anion gap: 11 (ref 5–15)
BUN: 13 mg/dL (ref 6–20)
CO2: 27 mmol/L (ref 22–32)
Calcium: 9.4 mg/dL (ref 8.9–10.3)
Chloride: 99 mmol/L — ABNORMAL LOW (ref 101–111)
Creatinine, Ser: 0.96 mg/dL (ref 0.44–1.00)
GFR calc Af Amer: >60 mL/min
GFR calc non Af Amer: >60 mL/min
Glucose, Bld: 349 mg/dL — ABNORMAL HIGH (ref 65–99)
Potassium: 4 mmol/L (ref 3.5–5.1)
Sodium: 137 mmol/L (ref 135–145)
Total Bilirubin: 0.6 mg/dL (ref 0.3–1.2)
Total Protein: 8.4 g/dL — ABNORMAL HIGH (ref 6.5–8.1)

## 2018-01-30 LAB — CBC
HEMATOCRIT: 40.8 % (ref 35.0–47.0)
HEMOGLOBIN: 13.5 g/dL (ref 12.0–16.0)
MCH: 30.7 pg (ref 26.0–34.0)
MCHC: 33.2 g/dL (ref 32.0–36.0)
MCV: 92.5 fL (ref 80.0–100.0)
Platelets: 230 10*3/uL (ref 150–440)
RBC: 4.41 MIL/uL (ref 3.80–5.20)
RDW: 14.7 % — ABNORMAL HIGH (ref 11.5–14.5)
WBC: 6.4 10*3/uL (ref 3.6–11.0)

## 2018-01-30 LAB — GLUCOSE, CAPILLARY: GLUCOSE-CAPILLARY: 270 mg/dL — AB (ref 65–99)

## 2018-01-30 LAB — TROPONIN I

## 2018-01-30 LAB — BRAIN NATRIURETIC PEPTIDE: B Natriuretic Peptide: 39 pg/mL (ref 0.0–100.0)

## 2018-01-30 MED ORDER — INSULIN ASPART 100 UNIT/ML ~~LOC~~ SOLN
8.0000 [IU] | Freq: Once | SUBCUTANEOUS | Status: AC
  Administered 2018-01-30: 8 [IU] via SUBCUTANEOUS
  Filled 2018-01-30: qty 1

## 2018-01-30 MED ORDER — MEDICAL COMPRESSION STOCKINGS MISC: 0 refills | Status: DC

## 2018-01-30 MED ORDER — CEPHALEXIN 500 MG PO CAPS: 500.0000 mg | ORAL_CAPSULE | Freq: Two times a day (BID) | ORAL | 0 refills | Status: AC

## 2018-01-30 MED ORDER — FUROSEMIDE 10 MG/ML IJ SOLN
20.0000 mg | Freq: Once | INTRAMUSCULAR | Status: AC
  Administered 2018-01-30: 20 mg via INTRAVENOUS
  Filled 2018-01-30: qty 4

## 2018-01-30 NOTE — ED Notes (Signed)
Blood glucose 270.

## 2018-01-30 NOTE — Discharge Instructions (Signed)
Take your insulin as prescribed and monitor your glucose frequently.  You should try to keep your legs elevated and you may use the prescribed compression stockings.  Follow-up with your primary care doctor within the next 1-2 weeks.  Return to the emergency department for new, worsening, persistent swelling, pain, or any difficulty breathing, weakness or lightheadedness, fevers, rashes, or any other new or worsening symptoms that concern you.

## 2018-01-30 NOTE — ED Notes (Signed)
PA student in room to assess patient.  Will continue to monitor.

## 2018-01-30 NOTE — ED Provider Notes (Addendum)
[facility] Emergency Department Provider Note ____________________________________________   First MD Initiated Contact with Patient 01/30/18 1730     (approximate)  I have reviewed the triage vital signs and the nursing notes.   HISTORY  Chief Complaint Leg Swelling    HPI Brenda Hicks is a 52 y.o. female with past medical history as noted below who presents with bilateral lower extremity edema, acute onset today, associated with some pain, and also initially present in her arms, but she states that that resolved on its own.  The edema does not improve after she kept her legs elevated.  She denies any previous history of this symptom, although does report some intermittent bilateral lower leg pain and states that a few weeks ago when she saw her doctor he thought that she might have a "vein problem" in her legs.  Past Medical History:  Diagnosis Date  . Arthritis   . Asthma    WELL CONTROLLED  . Cancer of ear   . Diabetes mellitus without complication ([place])   . Fatty liver   . Hypertension   . Kidney cysts   . Renal disorder   . Sleep apnea    USES CPAP    Patient Active Problem List   Diagnosis Date Noted  . Sepsis affecting skin ([place]) 06/09/2017  . Sebaceous cyst 06/01/2016  . Obstructive apnea 05/31/2016  . HLD (hyperlipidemia) 05/31/2016  . Acid reflux 05/31/2016  . Essential (primary) hypertension 05/31/2016  . Abnormal Pap smear of cervix 05/31/2016  . Arthritis of knee, degenerative 07/25/2015  . History of artificial joint 07/25/2015  . Gonalgia 02/17/2015  . Type 2 diabetes mellitus ([place]) 10/23/2014  . Otalgia of left ear 09/24/2014  . 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 Diabetic peripheral neuropathy associated with type 2 diabetes mellitus ([place]) 05/16/2014  . Endometrial polyp 11/01/2013  . Fibroids, intramural 10/09/2013  . Pain due  to knee joint prosthesis ([place]) 10/05/2013  . Body mass index (BMI) of 50-59.9 in adult ([place]) 10/03/2013  . Abnormal uterine bleeding 10/03/2013  . Adiposity 10/01/2013  . Excessive and frequent menstruation with irregular cycle 10/01/2013  . Adaptive colitis 10/01/2013  . History of migraine headaches 10/01/2013  . H/O malignant neoplasm of skin 10/01/2013  . Fatty liver disease, nonalcoholic 14/48/1856  . Diverticulitis 10/01/2013  . Current tobacco use 08/29/2013  . H/O total knee replacement 08/29/2013  . Cannot sleep 08/29/2013  . Dysmenorrhea 08/29/2013  . Airway hyperreactivity 08/29/2013  . Absolute anemia 08/29/2013  . Tobacco use 08/29/2013    Past Surgical History:  Procedure Laterality Date  . DILATION AND CURETTAGE OF UTERUS    . ENDOMETRIAL BIOPSY    . EXPLORATORY LAPAROTOMY     REMOVAL OF RUPTURED ECTOPIC  . HAND SURGERY    . HERNIA REPAIR     UMBILICAL  . JOINT REPLACEMENT Right    TKR  . KNEE ARTHROSCOPY    . KNEE SURGERY Right   . SHOULDER ARTHROSCOPY WITH BICEPSTENOTOMY Left 12/14/2016   Procedure: SHOULDER ARTHROSCOPY WITH BICEPSTENOTOMY;  Surgeon: Corky Mull, MD;  Location: [facility] ORS;  Service: Orthopedics;  Laterality: Left;  . SHOULDER ARTHROSCOPY WITH OPEN ROTATOR CUFF REPAIR Left 12/14/2016   Procedure: SHOULDER ARTHROSCOPY WITH OPEN ROTATOR CUFF REPAIR AND ARTHROSCOPIC ROTATOR CUFF REPAIR;  Surgeon: Corky Mull, MD;  Location: [facility] ORS;  Service: Orthopedics;  Laterality: Left;  . SHOULDER ARTHROSCOPY WITH SUBACROMIAL DECOMPRESSION Left  12/14/2016   Procedure: SHOULDER ARTHROSCOPY WITH SUBACROMIAL DECOMPRESSION;  Surgeon: Corky Mull, MD;  Location: [facility] ORS;  Service: Orthopedics;  Laterality: Left;  . TUBAL LIGATION      Prior to Admission medications   Medication Sig Start Date End Date Taking? Authorizing Provider  aspirin EC 81 MG tablet Take 81 mg by mouth daily.  01/14/16  Yes [provider]  atorvastatin (LIPITOR) 40 MG tablet Take  1 tablet (40 mg total) by mouth daily. 12/07/15 01/30/18 Yes Summers, Rhonda L, PA-C  gabapentin (NEURONTIN) 300 MG capsule Take 300 mg by mouth 3 (three) times daily.    Yes [provider]  insulin glargine (LANTUS) 100 UNIT/ML injection Inject 1 mL (100 Units total) into the skin at bedtime. 11/22/17  Yes Letitia Neri L, PA-C  insulin lispro (HUMALOG) 100 UNIT/ML injection Inject 0.2 mLs (20 Units total) into the skin 3 (three) times daily with meals. Patient taking differently: Inject 40 Units into the skin 3 (three) times daily with meals.  11/22/17 04/18/19 Yes Letitia Neri L, PA-C  lisinopril (PRINIVIL,ZESTRIL) 40 MG tablet Take 40 mg by mouth every evening.  01/14/16 01/30/18 Yes [provider]  meloxicam (MOBIC) 7.5 MG tablet Take 1 tablet by mouth 2 (two) times daily. 01/25/18  Yes [provider]  metFORMIN (GLUCOPHAGE) 1000 MG tablet Take 1 tablet by mouth 2 (two) times daily. 01/25/18  Yes [provider]  orphenadrine (NORFLEX) 100 MG tablet Take 1 tablet (100 mg total) by mouth 2 (two) times daily as needed for muscle spasms. 11/22/17  Yes Letitia Neri L, PA-C  sitaGLIPtin (JANUVIA) 100 MG tablet Take 100 mg by mouth daily. Patient reports taking Januvia 100 mg daily   Yes [provider]  traZODone (DESYREL) 50 MG tablet Take 1 tablet by mouth at bedtime. 01/25/18  Yes [provider]  albuterol (PROVENTIL HFA;VENTOLIN HFA) 108 (90 Base) MCG/ACT inhaler Inhale 2 puffs into the lungs every 6 (six) hours as needed for wheezing or shortness of breath.    [provider]  cephALEXin (KEFLEX) 500 MG capsule Take 1 capsule (500 mg total) by mouth 2 (two) times daily for 7 days. 01/30/18 02/06/18  Arta Silence, MD  doxycycline (VIBRA-TABS) 100 MG tablet Take 1 tablet (100 mg total) by mouth every 12 (twelve) hours. Patient not taking: Reported on 01/30/2018 06/15/17   Hillary Bow, MD  Elastic Bandages & Supports (MEDICAL  COMPRESSION STOCKINGS) MISC 2 medium stockings Use as directed on both lower legs 01/30/18   Arta Silence, MD  FLOVENT HFA 220 MCG/ACT inhaler Inhale 2 puffs into the lungs 2 (two) times daily. 01/25/18   [provider]  HYDROcodone-acetaminophen (NORCO/VICODIN) 5-325 MG tablet Take 1 tablet by mouth every 8 (eight) hours as needed for moderate pain. 11/22/17   Letitia Neri L, PA-C  STIOLTO RESPIMAT 2.5-2.5 MCG/ACT AERS Take 2 puffs by mouth daily. 01/26/18   [provider]  tiZANidine (ZANAFLEX) 4 MG tablet Take 1 tablet by mouth 3 (three) times daily. 01/25/18   [provider]  zolpidem (AMBIEN) 5 MG tablet Take 1 tablet (5 mg total) by mouth at bedtime as needed for sleep. Patient not taking: Reported on 01/30/2018 12/14/16   Poggi, Marshall Cork, MD    Allergies Patient has no known allergies.  No family history on file.  Social History Social History   Tobacco Use  . Smoking status: Current Every Day Smoker    Packs/day: 0.50    Years: 25.00  Pack years: 12.50    Types: Cigarettes  . Smokeless tobacco: Never Used  Substance Use Topics  . Alcohol use: No  . Drug use: No    Review of Systems  Constitutional: No fever. Eyes: No redness. ENT: No sore throat. Cardiovascular: Denies chest pain. Respiratory: Positive for mild shortness of breath. Gastrointestinal: No nausea, no vomiting.  Genitourinary: Negative for dysuria.  Positive for frequency.  Musculoskeletal: Negative for back pain.  Positive for bilateral leg swelling. Skin: Negative for rash. Neurological: Negative for focal weakness or numbness.   ____________________________________________   PHYSICAL EXAM:  VITAL SIGNS: ED Triage Vitals [01/30/18 1511]  Enc Vitals Group     BP (!) 141/82     Pulse Rate 70     Resp 18     Temp 98.2 F (36.8 C)     Temp Source Oral     SpO2 100 %     Weight (!) 302 lb (137 kg)     Height 5\' 6"  (1.676 m)     Head Circumference      Peak  Flow      Pain Score 8     Pain Loc      Pain Edu?      Excl. in [place]?     Constitutional: Alert and oriented.  Relatively well appearing and in no acute distress. Eyes: Conjunctivae are normal.  Head: Atraumatic. Nose: No congestion/rhinnorhea. Mouth/Throat: Mucous membranes are moist.   Neck: Normal range of motion.  Cardiovascular: Normal rate, regular rhythm. Grossly normal heart sounds.  Good peripheral circulation. Respiratory: Normal respiratory effort.  No retractions.  Slight decreased breath sounds bilaterally but lungs otherwise CTAB. Gastrointestinal:  No distention.  Musculoskeletal: Bilateral 1+ lower extremity edema.  Extremities warm and well perfused.  No upper extremity edema.  No erythema, warmth, or induration.  Mild tenderness to bilateral calves.   Neurologic:  Normal speech and language. No gross focal neurologic deficits are appreciated.  Skin:  Skin is warm and dry. No rash noted. Psychiatric: Mood and affect are normal. Speech and behavior are normal.  ____________________________________________   LABS (all labs ordered are listed, but only abnormal results are displayed)  Labs Reviewed  CBC - Abnormal; Notable for the following components:      Result Value   RDW 14.7 (*)    All other components within normal limits  URINALYSIS, COMPLETE (UACMP) WITH MICROSCOPIC - Abnormal; Notable for the following components:   Color, Urine STRAW (*)    APPearance CLEAR (*)    Glucose, UA >=500 (*)    Bacteria, UA RARE (*)    Squamous Epithelial / LPF 0-5 (*)    All other components within normal limits  COMPREHENSIVE METABOLIC PANEL - Abnormal; Notable for the following components:   Chloride 99 (*)    Glucose, Bld 349 (*)    Total Protein 8.4 (*)    All other components within normal limits  GLUCOSE, CAPILLARY - Abnormal; Notable for the following components:   Glucose-Capillary 270 (*)    All other components within normal limits  BRAIN NATRIURETIC PEPTIDE    TROPONIN I   ____________________________________________  EKG  ED ECG REPORT I, Arta Silence, the attending physician, personally viewed and interpreted this ECG.  Date: 02/13/2018 EKG Time: 1519 Rate: 74 Rhythm: normal sinus rhythm QRS Axis: normal Intervals: normal ST/T Wave abnormalities: normal Narrative Interpretation: no evidence of acute ischemia  ____________________________________________  RADIOLOGY  CXR: No focal infiltrate, pulmonary edema, or other acute findings  [place] lower ext bilat: No acute DVT ____________________________________________   PROCEDURES  Procedure(s) performed: No  Procedures  Critical Care performed: No ____________________________________________   INITIAL IMPRESSION / ASSESSMENT AND PLAN / ED COURSE  Pertinent labs & imaging results that were available during my care of the patient were reviewed by me and considered in my medical decision making (see chart for details).  52 year old female with history of diabetes, obesity, asthma, and other PMH as noted above presents with relatively acute onset of bilateral lower extremity edema as well as some upper extremity edema which resolved on its own.  She reports some shortness of breath but states this is chronic.  Past medical records reviewed in Epic; the patient was admitted to the hospital last year for cellulitis with sepsis, but this was on her trunk and not involving her extremities.  On exam, there is bilateral 1+ pitting edema to the lower extremities, but the extremities are otherwise warm and well perfused, and besides obesity, the remainder the exam is unremarkable.  Differential includes most likely chronic mild venous stasis/peripheral edema, versus fluid overload from CHF, renal etiology, or less likely hepatic etiology.  Patient reports some urinary frequency and her glucose was elevated in the urine so hyperglycemia may be contributing but is likely not a direct  cause.  Plan: Basic and cardiac labs, IV Lasix to help diurese the edema, chest x-ray, lower extremity ultrasound, and reassess.    ----------------------------------------- 11:44 PM on 01/30/2018 -----------------------------------------  However it probably went down after insulin.  There is no evidence of CHF, acute renal insufficiency, or hepatobiliary etiology.  Patient's UA is consistent with possible UTI, and she does endorse urinary frequency and burning.  I will prescribe an antibiotic.  I instructed the patient to continue to elevate her legs and use compression stockings which will be prescribed.  I instructed her to follow-up with her doctor to determine if she needs any other outpatient workup or potentially to be put on a diuretic.  Return precautions given, and she expresses understanding.  ____________________________________________   FINAL CLINICAL IMPRESSION(S) / ED DIAGNOSES  Final diagnoses:  Peripheral edema  Hyperglycemia      NEW MEDICATIONS STARTED DURING THIS VISIT:  Discharge Medication List as of 01/30/2018 10:34 PM    START taking these medications   Details  Elastic Bandages & Supports (MEDICAL COMPRESSION STOCKINGS) MISC 2 medium stockings Use as directed on both lower legs, Print         Note:  This document was prepared using Dragon voice recognition software and may include unintentional dictation errors.   Arta Silence, MD 01/30/18 2346    Arta Silence, MD 02/13/18 1325

## 2018-01-30 NOTE — ED Notes (Signed)
Pt. Verbalizes understanding of d/c instructions, medications, and follow-up. VS stable.  Pt. In NAD at time of d/c and denies further concerns regarding this visit. Pt. Stable at the time of departure from the unit, departing unit by the safest and most appropriate manner per that pt condition and limitations with all belongings accounted for. Pt advised to return to the ED at any time for emergent concerns, or for new/worsening symptoms.   

## 2018-01-30 NOTE — ED Notes (Addendum)
Patient reports she woke up this morning with "swelling all over".  Patient is in no distress at this time.  Patient reports pain to her right knee.  Patient denies history of similar pain and swelling.

## 2018-01-30 NOTE — ED Triage Notes (Signed)
Says swelling hands and feet and legs.  Says her right knee is swelling, had knee replaced 4 years ago.  Also says hurts to breath.

## 2018-01-30 NOTE — ED Notes (Addendum)
Pt given phone to call family member. Denies needs at this time. Awaiting [place] results

## 2018-01-30 NOTE — ED Notes (Addendum)
Pt given sandwich tray and diet gingerale. Updated on upcoming d/c and denies needs or concerns

## 2018-01-30 NOTE — ED Notes (Signed)
[name] EDT attempted to draw labs x 2 and this RN attempted to draw labs x 1.  This RN called the lab at this time to draw patient's labs.  MD notified.

## 2018-02-20 IMAGING — MR MR CERVICAL SPINE W/O CM
5 series · 34 of 48 positions shown · non-contrast
Comparison: Cervical spine radiographs 01/22/2013

CLINICAL DATA: Neck pain since [DATE]. Neck stiffness. Bilateral
shoulder pain.

EXAM:
MRI CERVICAL SPINE WITHOUT CONTRAST
TECHNIQUE: Multiplanar, multisequence MR imaging of the cervical spine was
performed. No intravenous contrast was administered.

[Series 2: T2 · sagittal · 3.0mm · 0.56mm/px · 8 of 13 slices shown (1 of 2)]
[im 1/13]
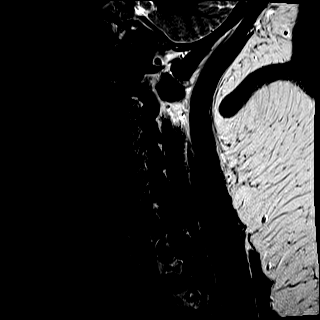
[im 2/13]
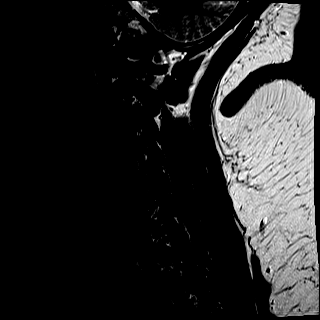
[im 4/13]
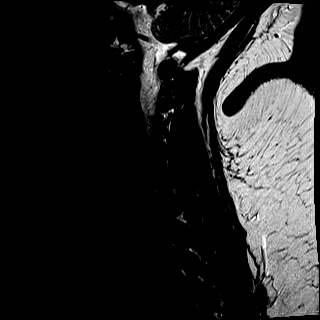
[im 6/13]
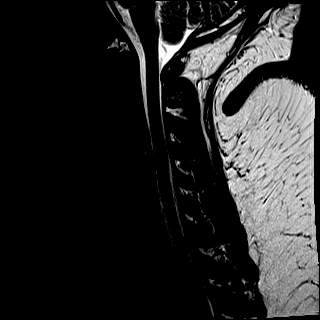
[im 7/13]
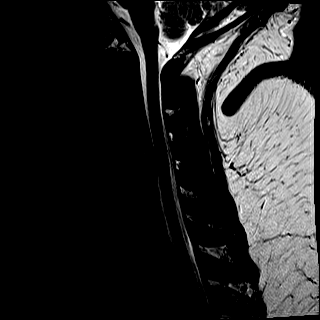
[im 9/13]
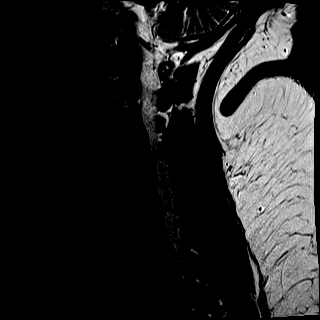
[im 11/13]
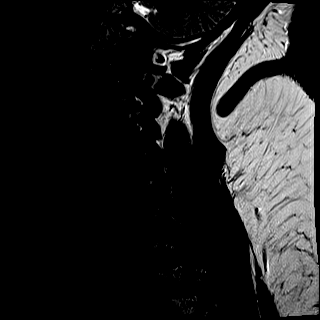
[im 13/13]
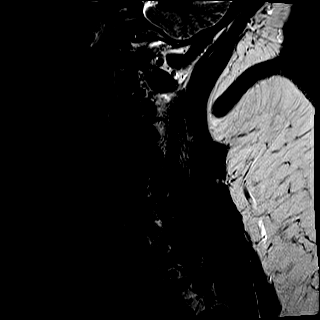

[Series 3: T1 · sagittal · 3.0mm · 0.70mm/px · 7 of 13 slices shown]
[im 1/13]
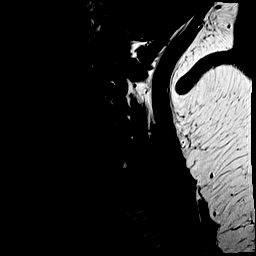
[im 3/13]
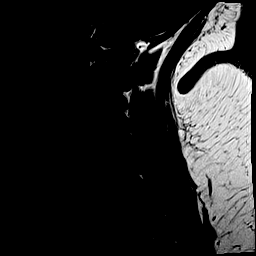
[im 5/13]
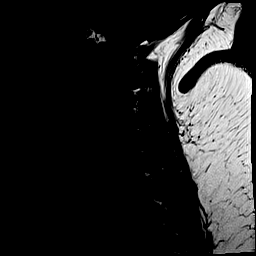
[im 7/13]
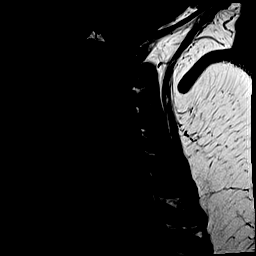
[im 9/13]
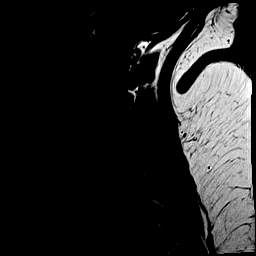
[im 11/13]
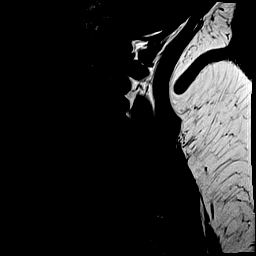
[im 13/13]
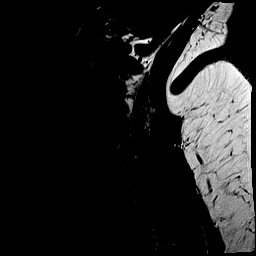

[Series 4: STIR · sagittal · 3.0mm · 0.35mm/px · 7 of 13 slices shown]
[im 1/13]
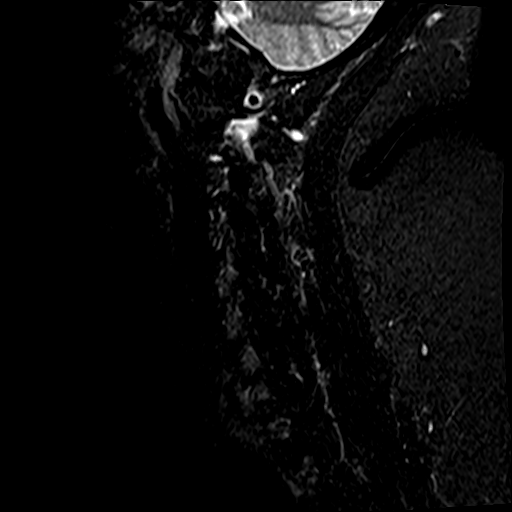
[im 3/13]
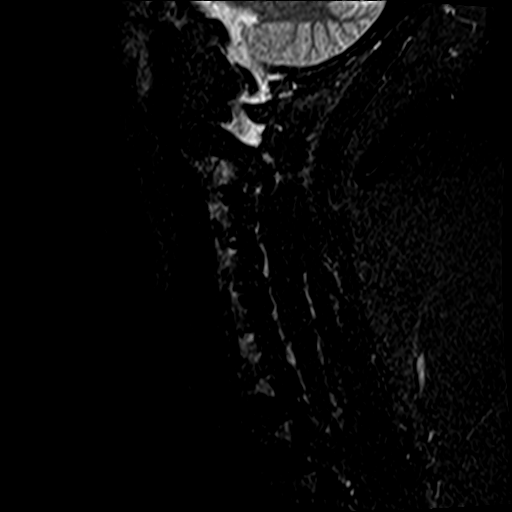
[im 5/13]
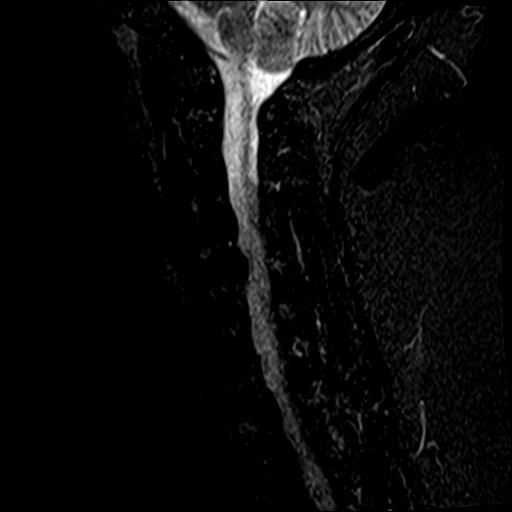
[im 7/13]
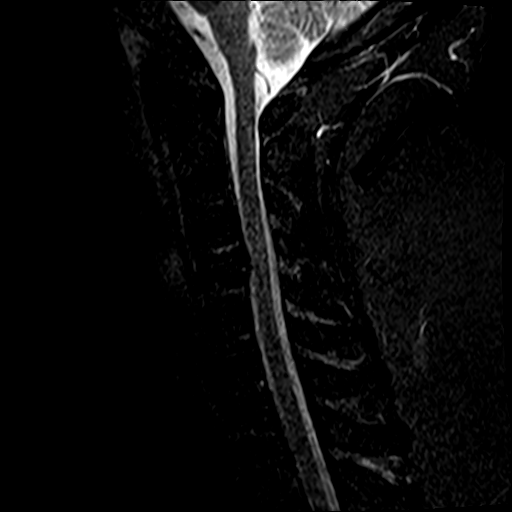
[im 9/13]
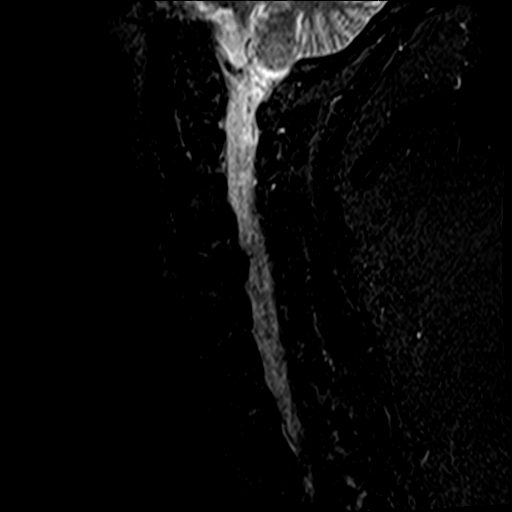
[im 11/13]
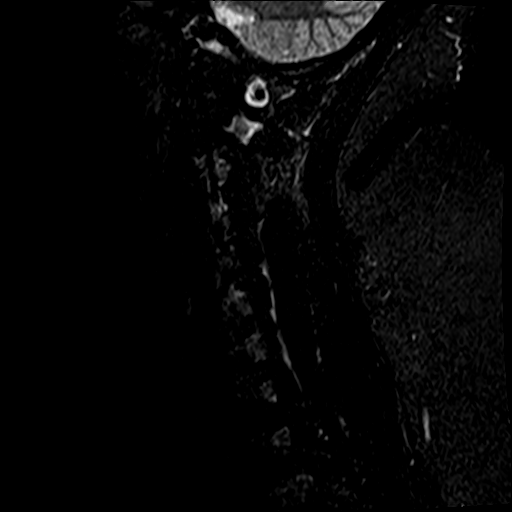
[im 13/13]
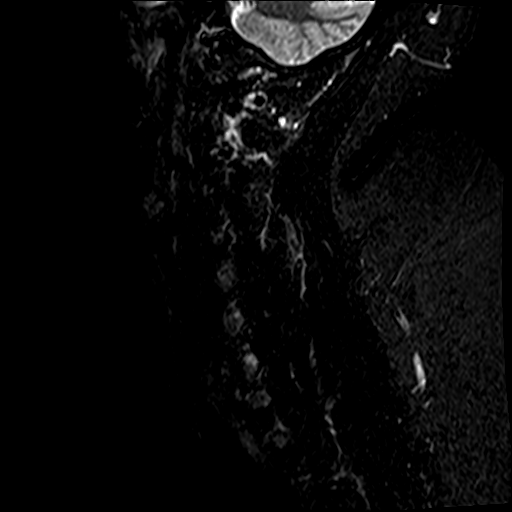

[Series 5: T2 · axial · 3.0mm · 0.62mm/px · z∈[-102,-12]mm · 9 of 25 slices shown (2 of 2)]
[im 1/25]
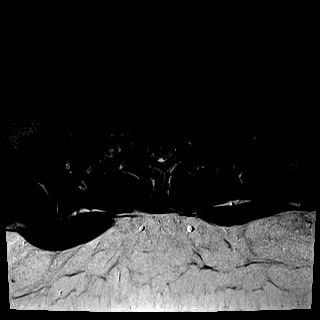
[im 5/25]
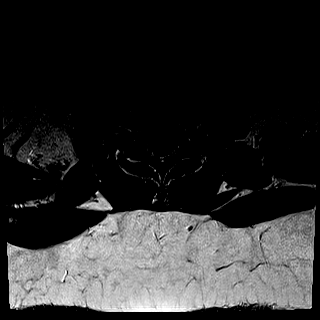
[im 9/25]
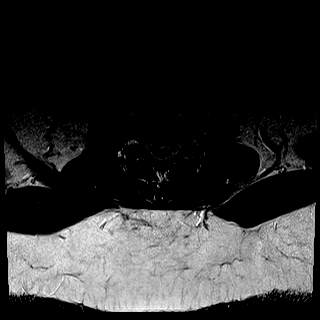
[im 11/25]
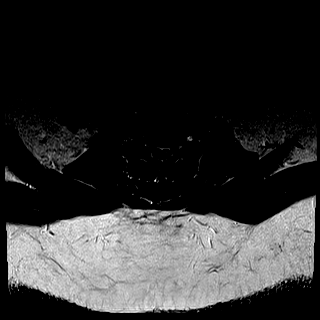
[im 13/25]
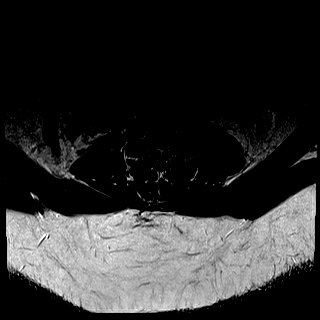
[im 15/25]
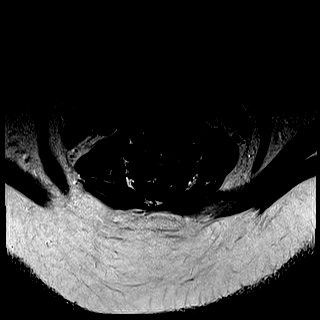
[im 17/25]
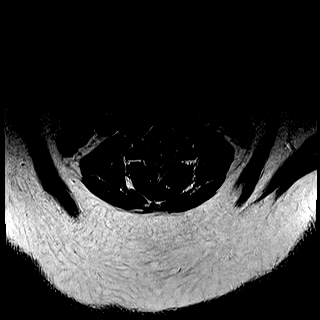
[im 21/25]
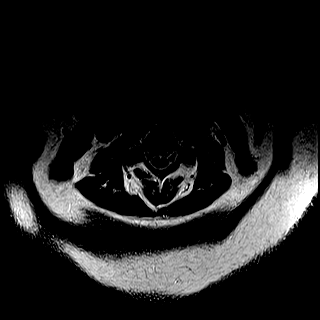
[im 25/25]
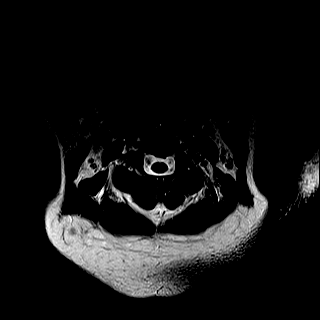

[Series 6: mpgr ax · axial · 3.0mm · 0.35mm/px · z∈[-93,-63]mm · 3 of 25 slices shown]
[im 1/25]
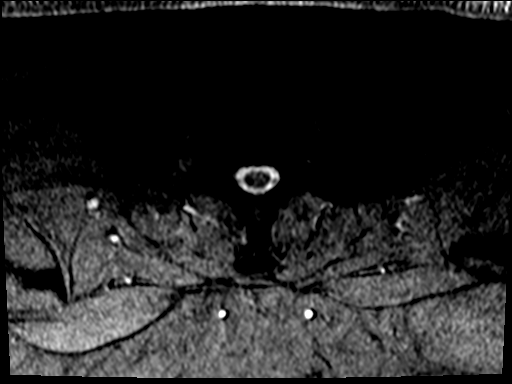
[im 5/25]
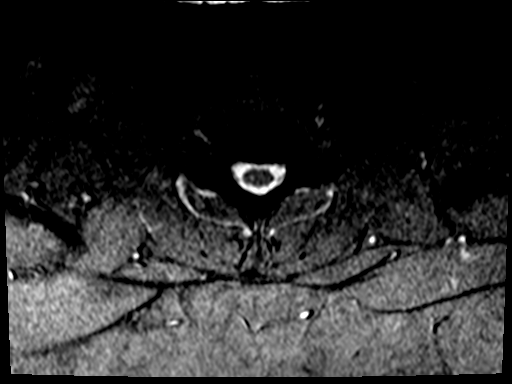
[im 9/25]
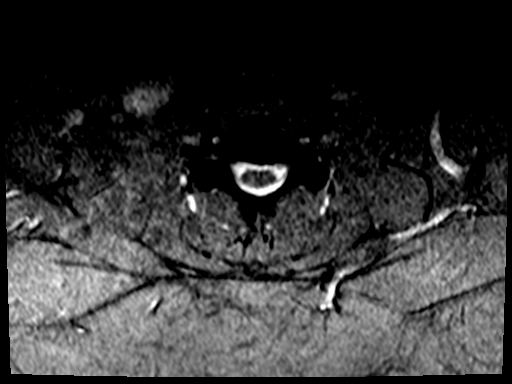

[34 of 48 positions shown; findings below may reference images not displayed]

FINDINGS: Alignment: Cervical spine straightening.  No significant listhesis.

Vertebrae: Diffusely diminished bone marrow T1 signal intensity
without suspicious focal lesion or evidence of significant marrow
edema. No fracture. Small C4 superior endplate Schmorl's node.

Cord: Possible mild T2 hyperintensity in the right hemicord at C4-5.
Normal cord signal elsewhere.

Posterior Fossa, vertebral arteries, paraspinal tissues:
Unremarkable.

Disc levels:

C2-3:  Negative.

C3-4: Shallow central/ left central disc protrusion without
stenosis.

C4-5: Moderate disc space narrowing. A central disc protrusion
mildly flattens the ventral spinal cord and results in mild spinal
stenosis. Uncovertebral spurring results in moderate bilateral
neural foraminal stenosis potentially affecting the C5 nerve roots.

C5-6:  Negative.

C6-7:  Negative.

C7-T1:  Minimal right uncovertebral spurring without stenosis.
IMPRESSION: 1. C4-5 disc degeneration including a central protrusion with mild
spinal stenosis and moderate bilateral neural foraminal stenosis.
Mild cord flattening with possible mild myelopathic signal change.
2. Small C3-4 disc protrusion without stenosis.
3. Diffusely diminished bone marrow signal, nonspecific. Common
causes include anemia, smoking, and obesity, although marrow
infiltrative processes can also give this appearance.

## 2018-03-22 ENCOUNTER — Ambulatory Visit: Payer: Medicaid Other | Admitting: Student in an Organized Health Care Education/Training Program

## 2018-03-22 IMAGING — CR DG CHEST 2V
2 series · 2 of 2 positions shown · non-contrast
Comparison: 05/04/2014

CLINICAL DATA: Sepsis with boil.

EXAM:
CHEST  2 VIEW

[chest pa]
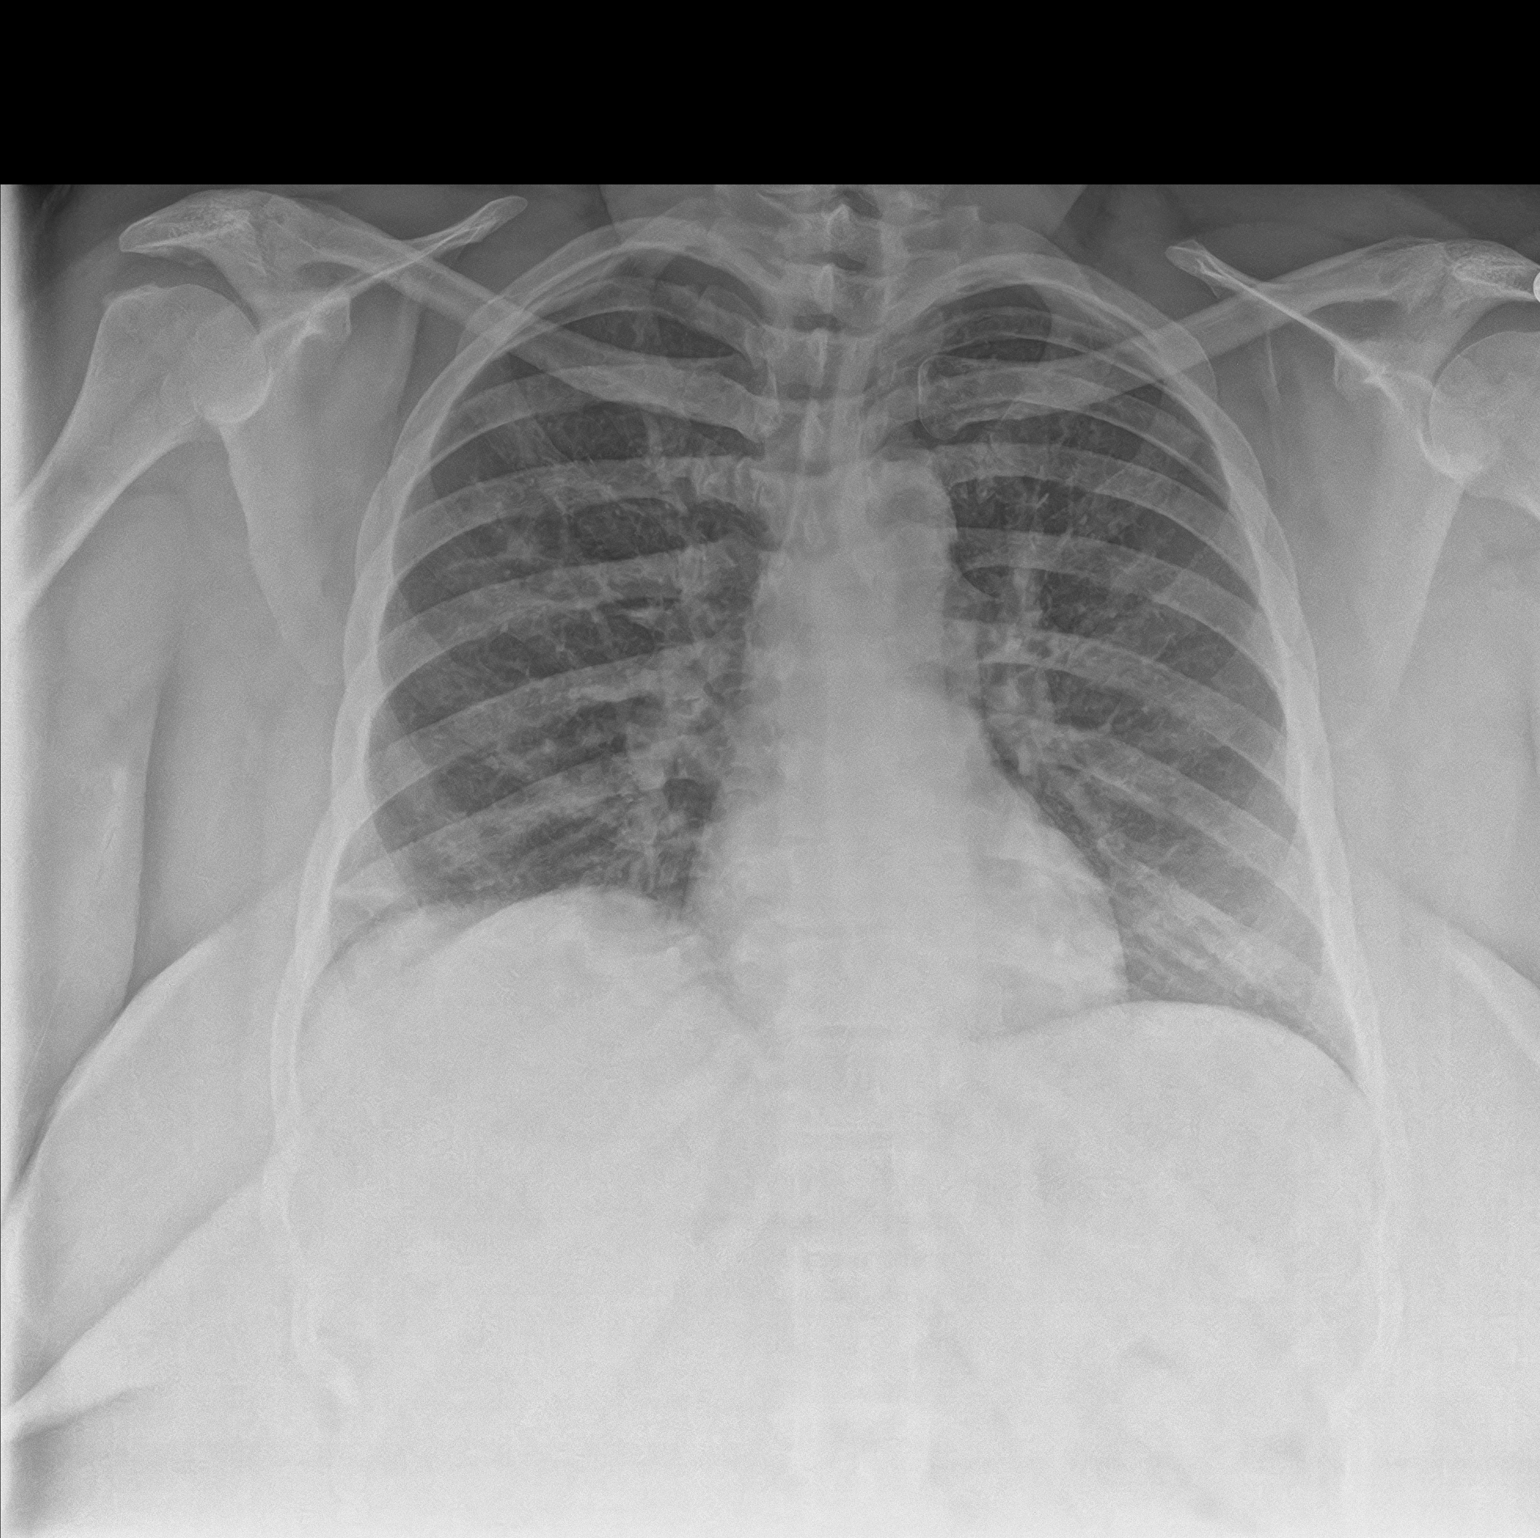

[chest lat]
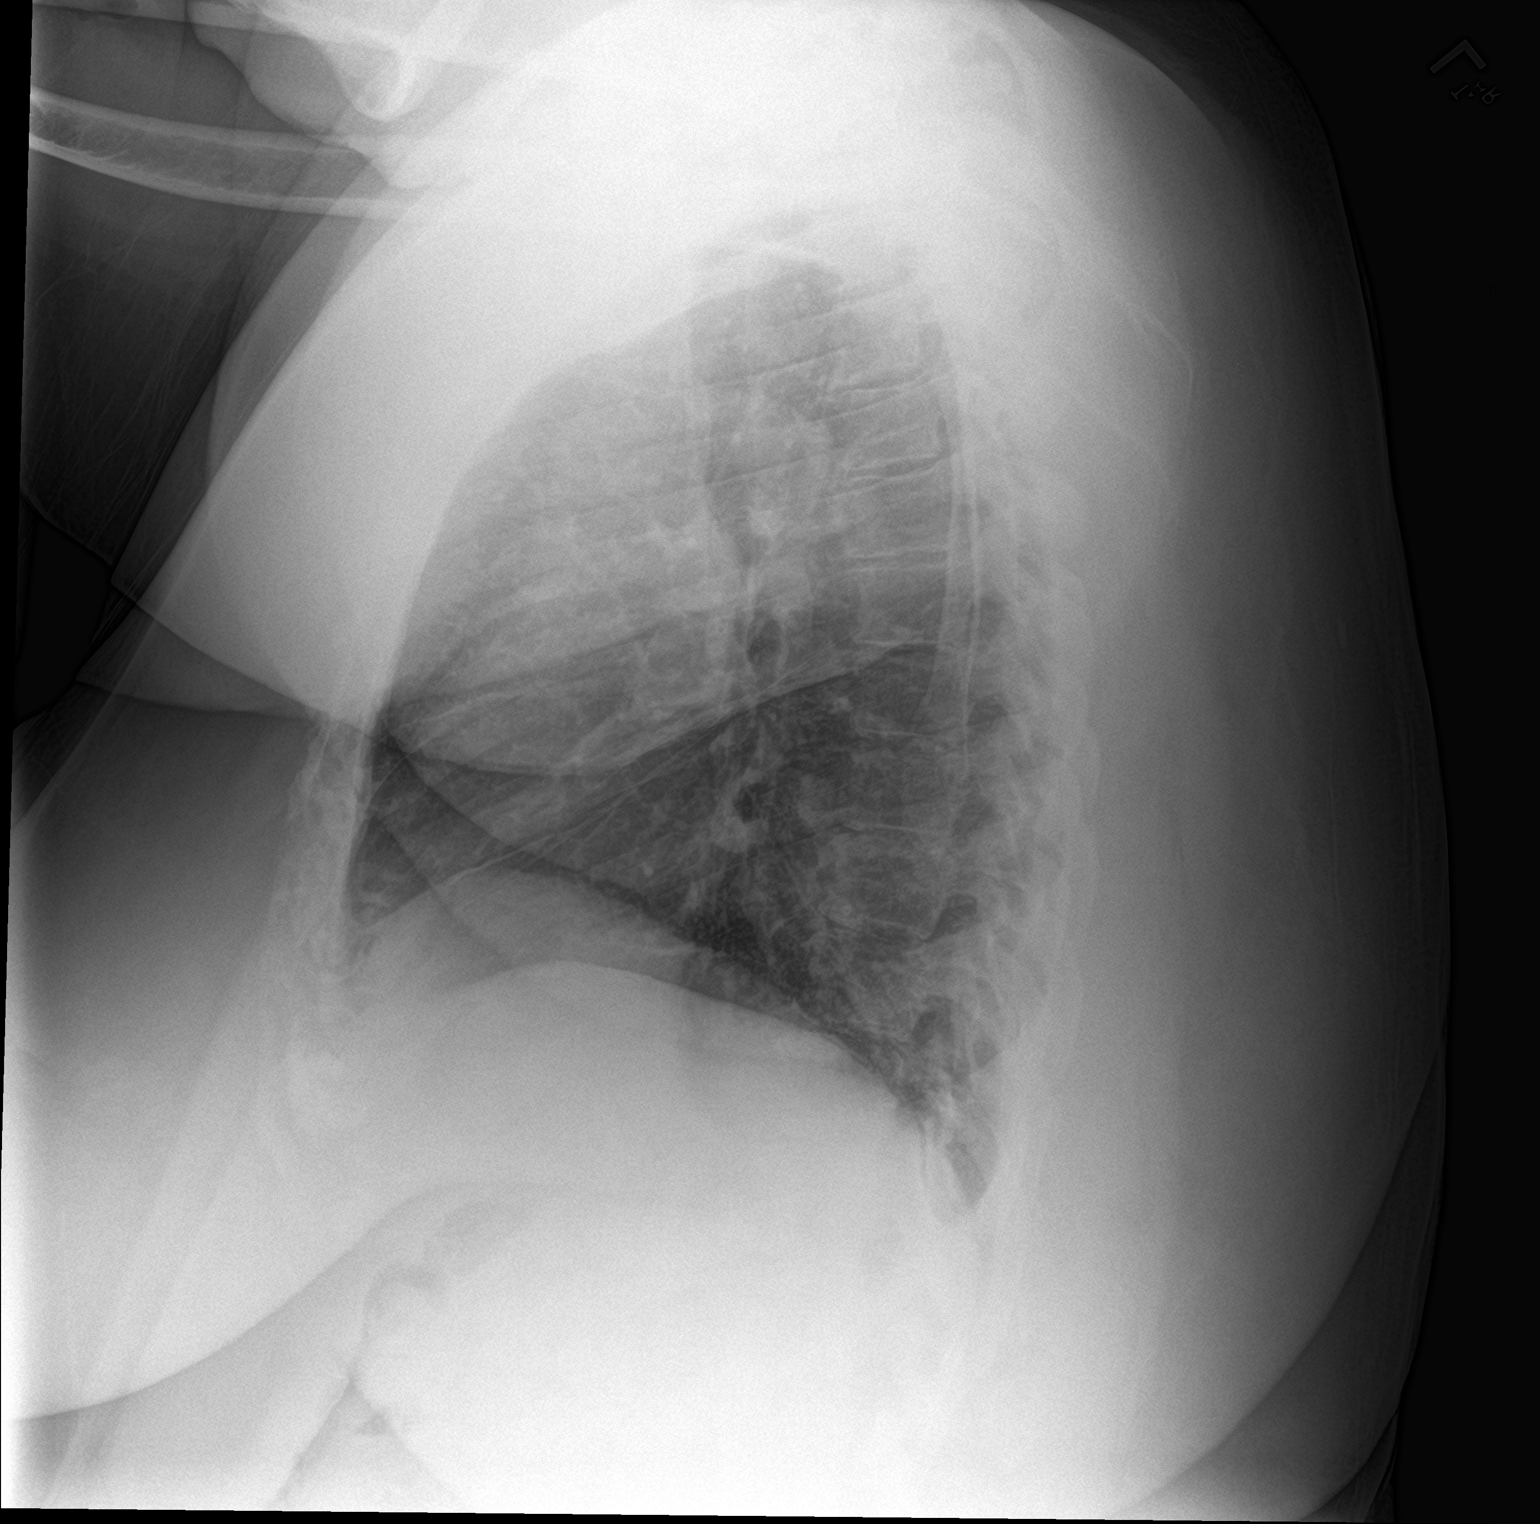

[2 of 2 positions shown; findings below may reference images not displayed]

FINDINGS: There is no edema, consolidation, effusion, or pneumothorax. Mild
linear atelectasis or scarring on the right. Normal heart size and
mediastinal contours.
IMPRESSION: Negative for pneumonia.

## 2018-03-23 IMAGING — CT CT L SPINE W/O CM
4 of 9 series · 12 of 33 positions shown, 13 images · non-contrast
Comparison: None.

CLINICAL DATA: 51 y/o  F; skin infection of the back with fever.

EXAM:
CT LUMBAR SPINE WITHOUT CONTRAST
TECHNIQUE: Multidetector CT imaging of the lumbar spine was performed without
intravenous contrast administration. Multiplanar CT image
reconstructions were also generated.

[Series 5: l spine soft · axial · 0.33mm/px · z∈[-316,-208]mm · 3 of 110 slices shown]
[im 28/110  soft-tissue]
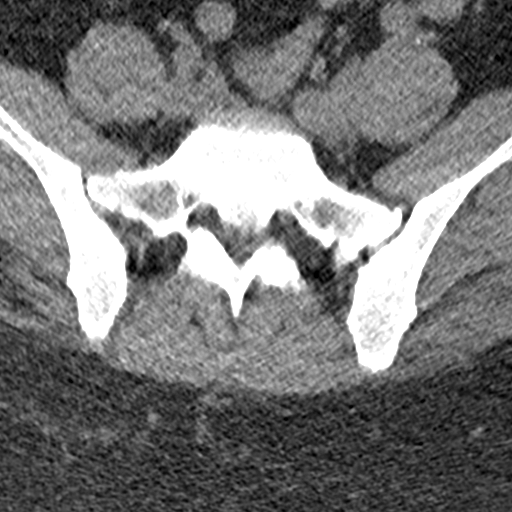
[im 55/110  soft-tissue]
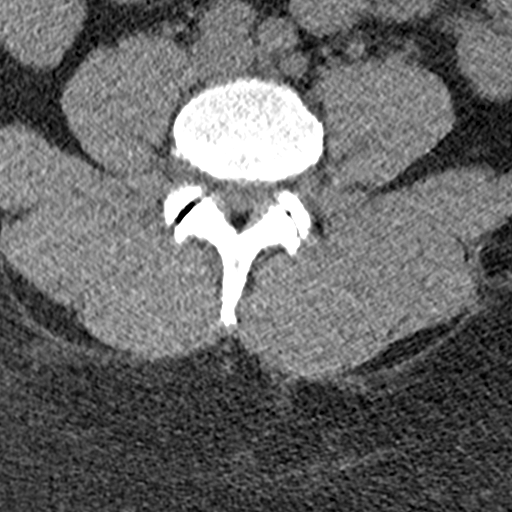
[im 82/110  soft-tissue]
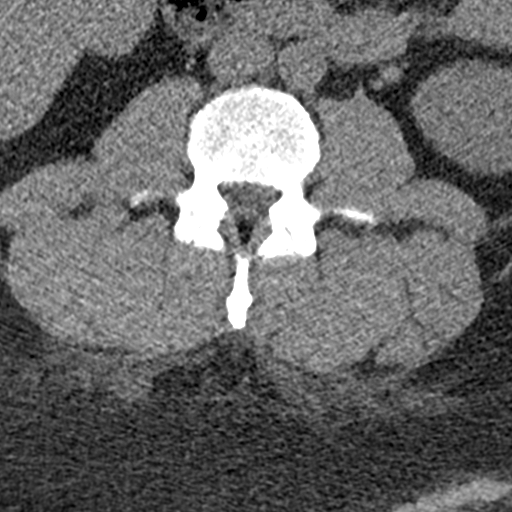

[Series 6: sagittal bone · sagittal · 0.29mm/px · 5 of 61 slices shown]
[im 11/61  bone]
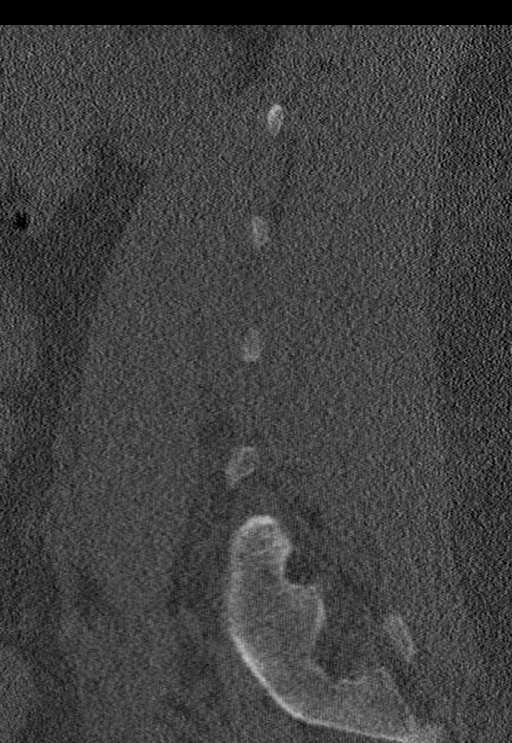
[im 21/61  bone]
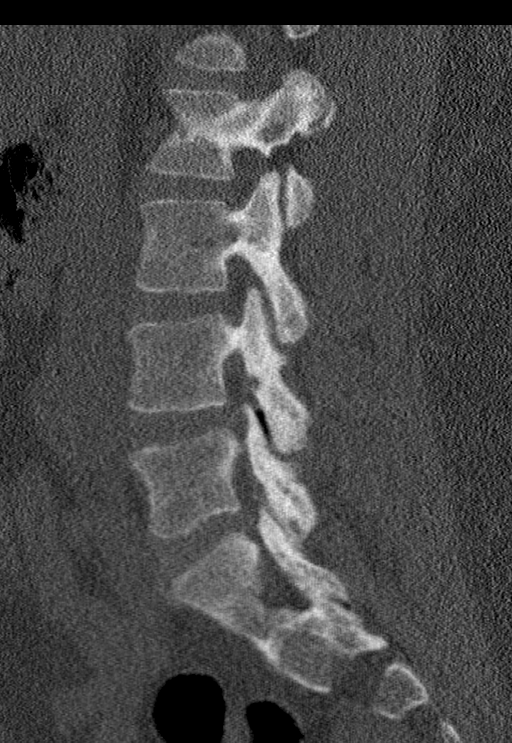
[im 31/61  bone]
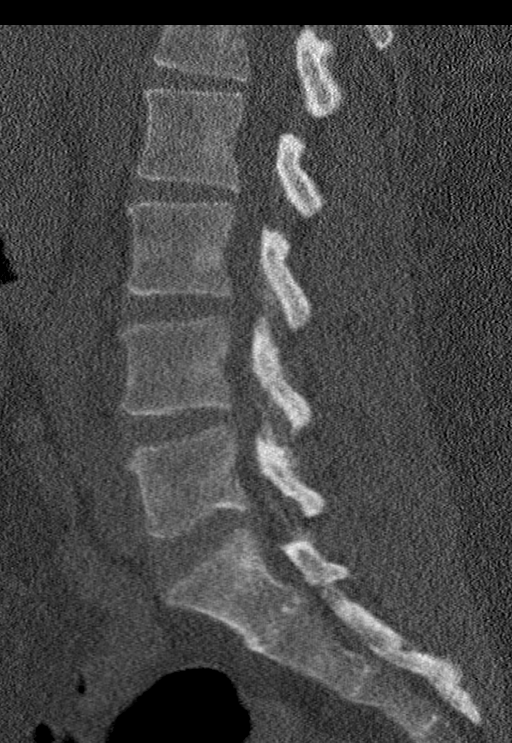
[im 41/61  bone]
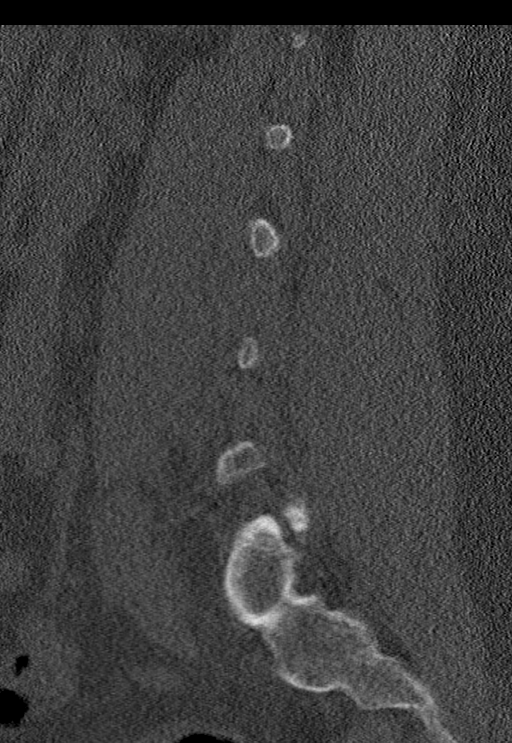
[im 51/61  bone]
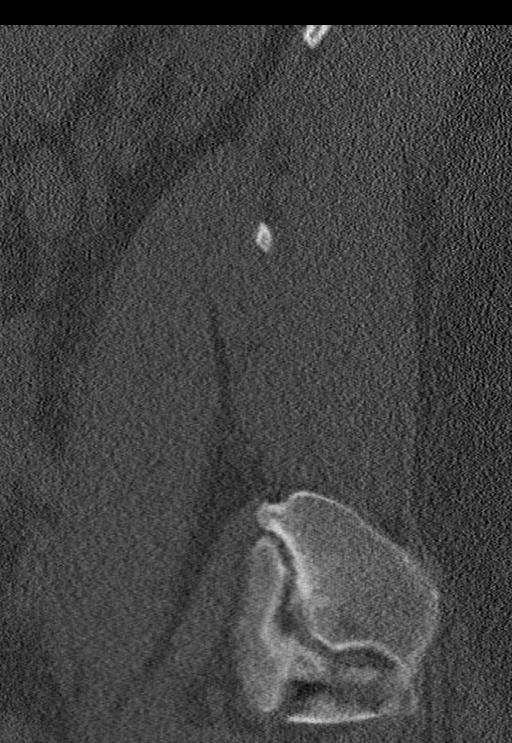

[Series 7: coronal bone · coronal · 0.32mm/px · 1 of 66 slices shown]
[im 33/66  bone]
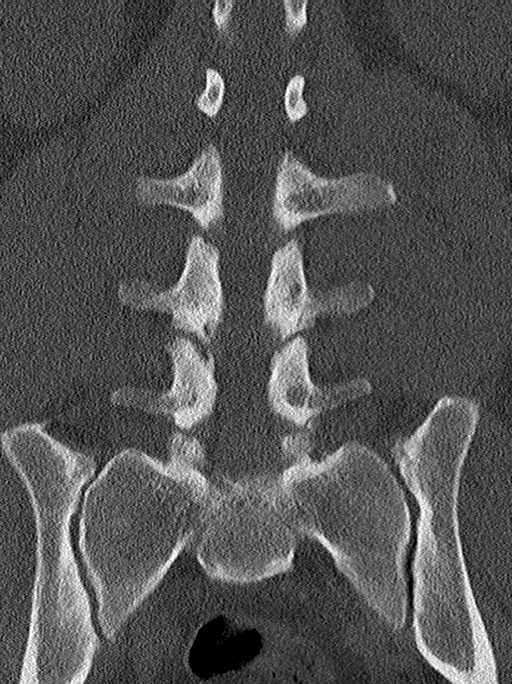

[Series 16: multi disc · axial · 0.21mm/px · z∈[-306,-195]mm · 3 of 97 slices shown, 4 images]
[im 25/97  soft-tissue]
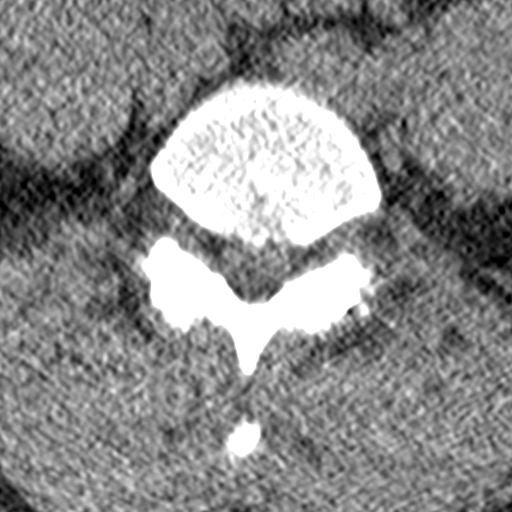
[im 25/97  bone]
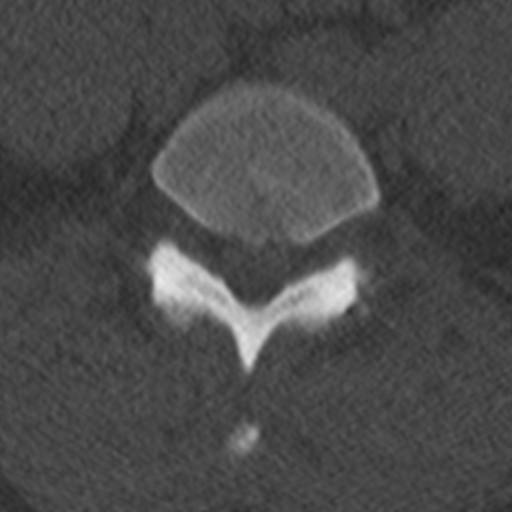
[im 49/97  bone]
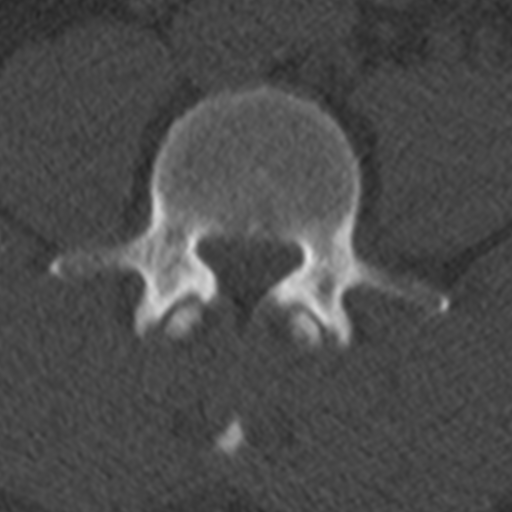
[im 73/97  bone]
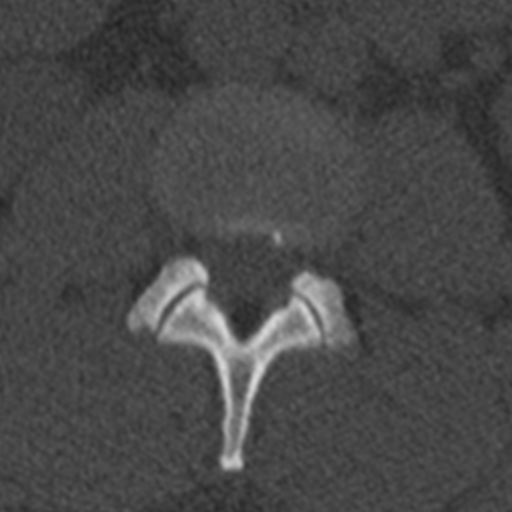

[12 of 33 positions shown; findings below may reference images not displayed]

FINDINGS: Segmentation: 5 lumbar type vertebrae.

Alignment: Normal.

Vertebrae: No acute fracture or focal pathologic process. No erosive
or destructive changes to suggest osteomyelitis.

Paraspinal and other soft tissues: Negative. No evidence for deep
soft tissue inflammatory process or fluid collection.

Disc levels: No significant bony foraminal narrowing or canal
stenosis.
IMPRESSION: No bony findings of osteomyelitis. No evidence for deep soft tissue
inflammatory process or fluid collection.

By: Sadkiel [place] M.D.

## 2018-03-23 IMAGING — CT CT T SPINE W/O CM
3 of 6 series · 10 of 33 positions shown, 11 images · non-contrast
Comparison: None.

CLINICAL DATA: 51 y/o  F; skin infection of the back with fever.

EXAM:
CT THORACIC SPINE WITHOUT CONTRAST
TECHNIQUE: Multidetector CT images of the thoracic were obtained using the
standard protocol without intravenous contrast.

[Series 3: t spine soft · axial · 0.28mm/px · z∈[-738,-542]mm · 4 of 154 slices shown, 5 images]
[im 28/154  soft-tissue]
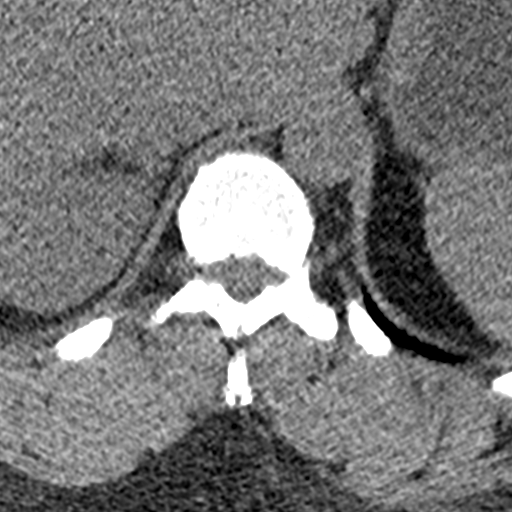
[im 28/154  bone]
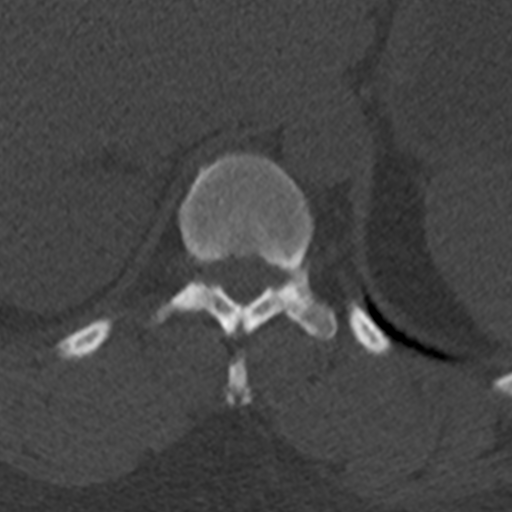
[im 56/154  bone]
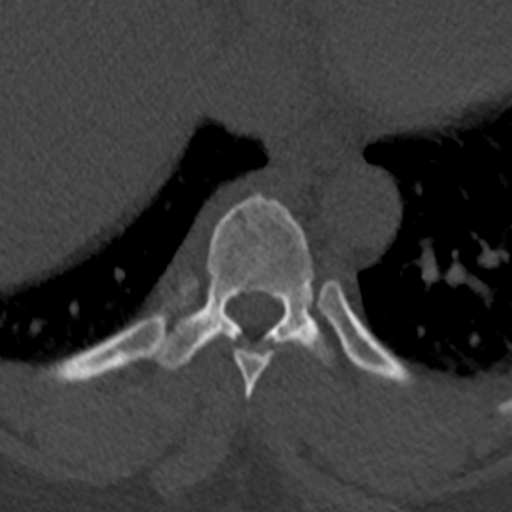
[im 98/154  bone]
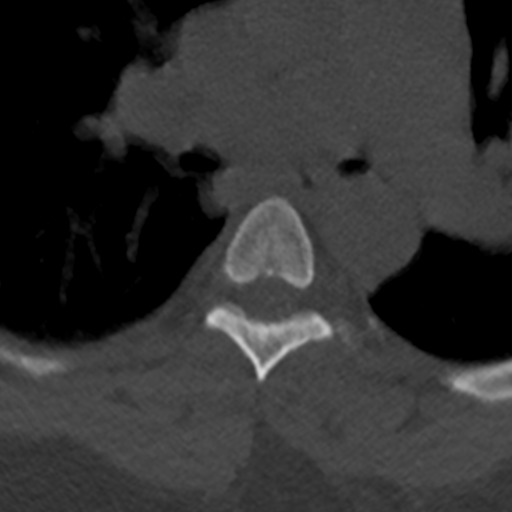
[im 126/154  bone]
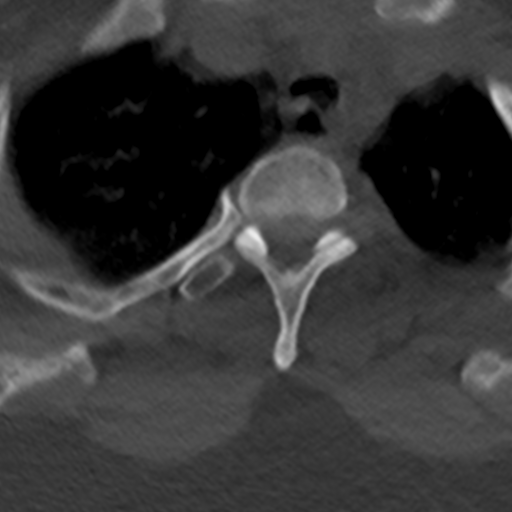

[Series 4: sagittal bone · sagittal · 0.25mm/px · 5 of 61 slices shown]
[im 11/61  bone]
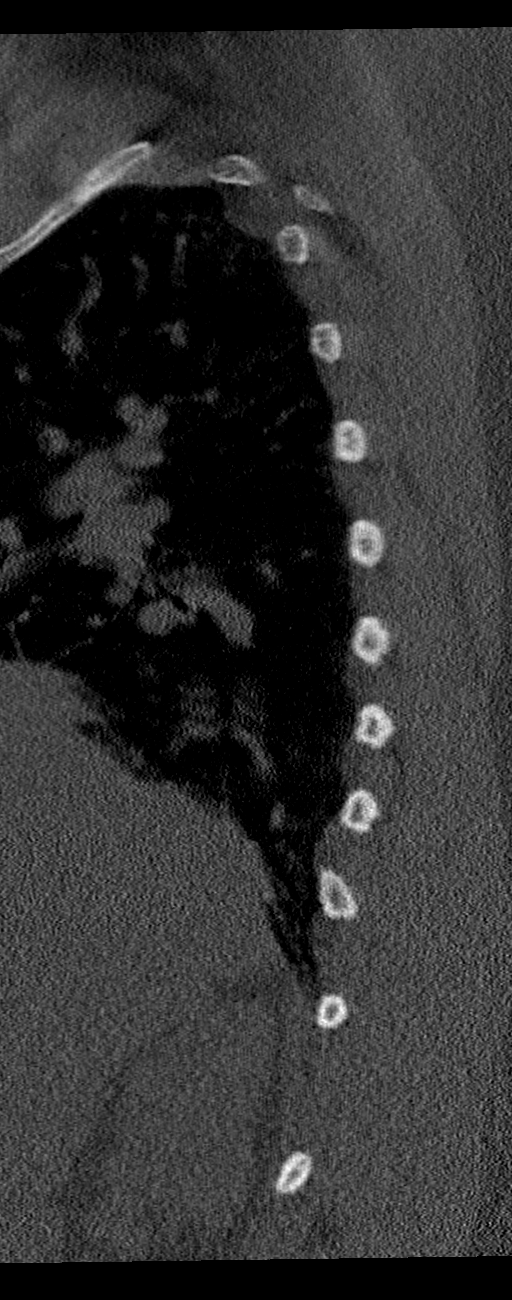
[im 21/61  bone]
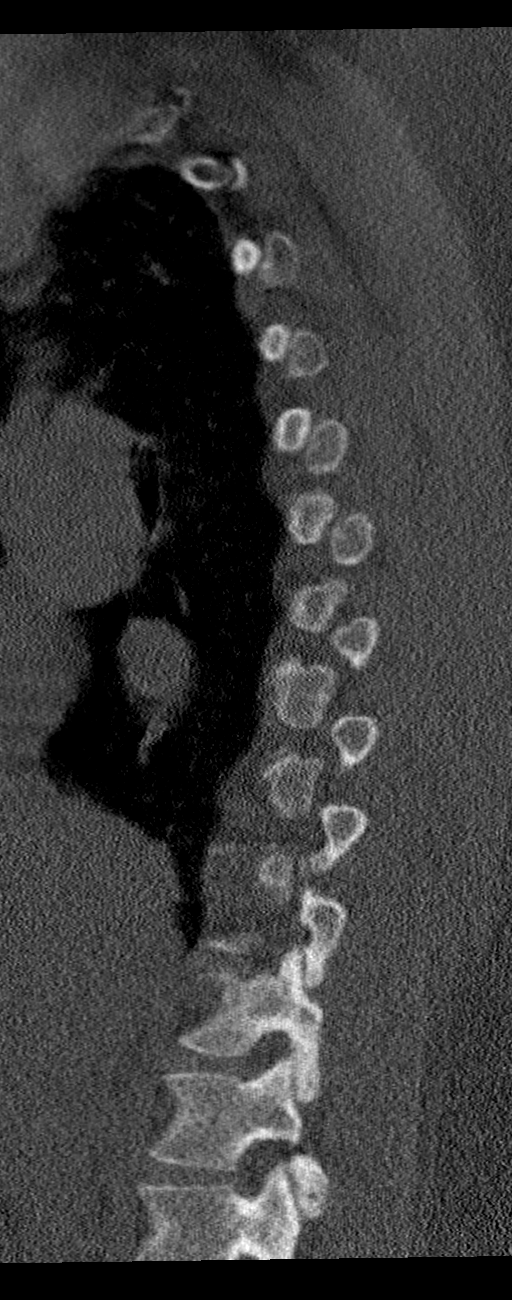
[im 31/61  bone]
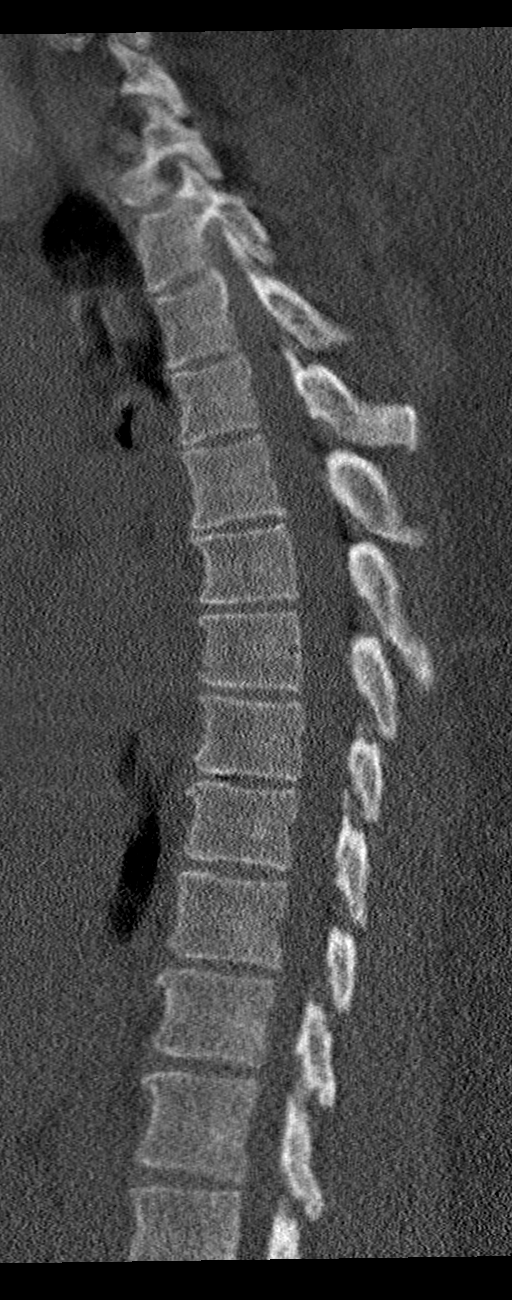
[im 41/61  bone]
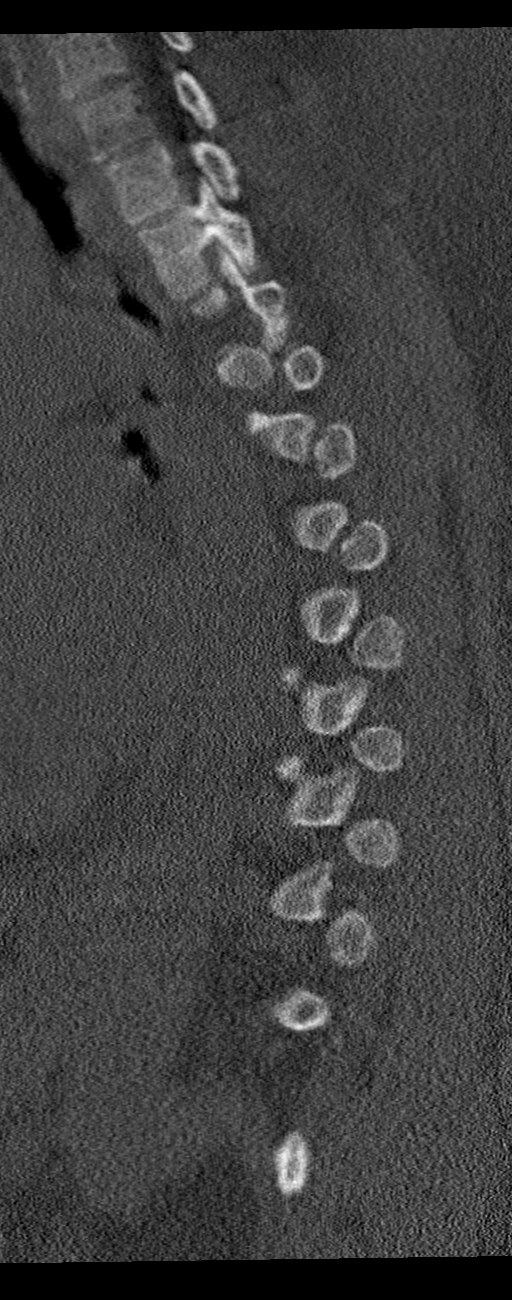
[im 51/61  bone]
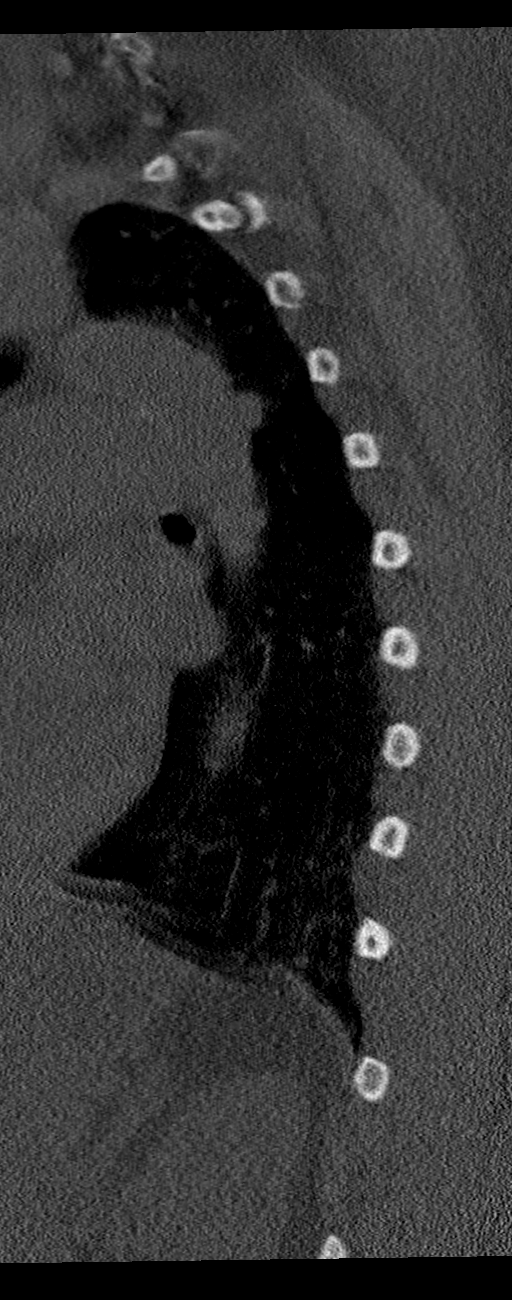

[Series 5: coronal bone · coronal · 0.22mm/px · 1 of 61 slices shown]
[im 31/61  bone]
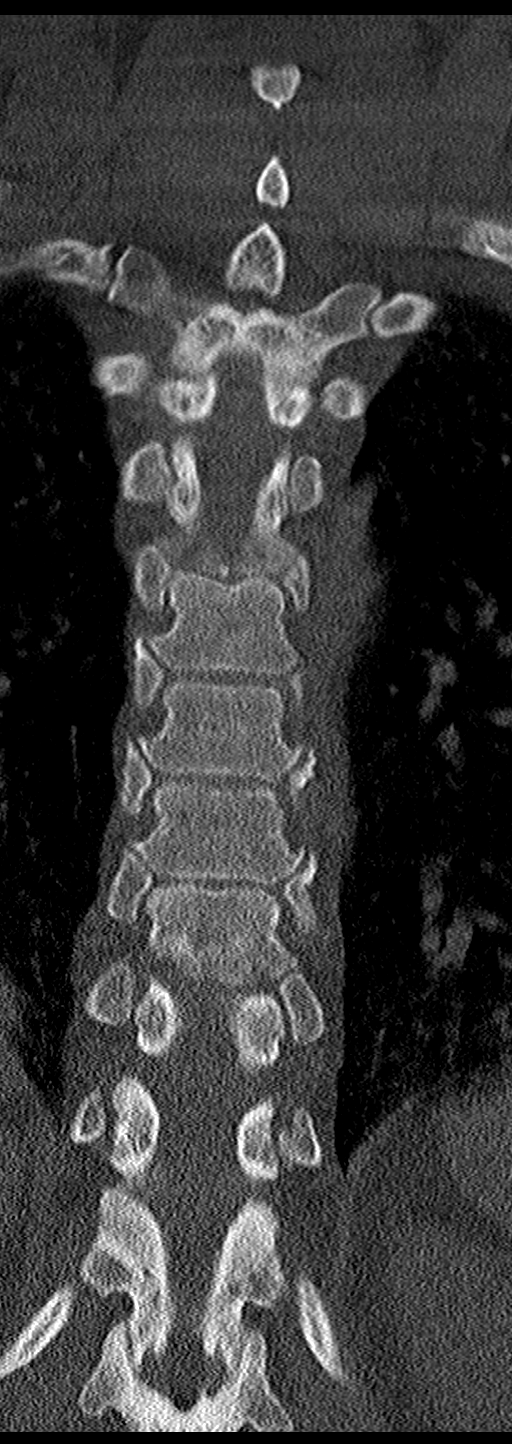

[10 of 33 positions shown; findings below may reference images not displayed]

FINDINGS: Alignment: Normal.

Vertebrae: No acute fracture or focal pathologic process. No bony
destructive or erosive change.

Paraspinal and other soft tissues: Negative. No appreciable
inflammation or fluid collection.

Disc levels: Mild discogenic degenerative changes with small
anterior marginal osteophytes.
IMPRESSION: No bony findings of osteomyelitis. No deep soft tissue inflammatory
change identified.

By: Pavan Oramas M.D.

## 2018-03-24 IMAGING — DX DG CHEST 1V PORT
1 series · 1 of 1 positions shown · non-contrast
Comparison: Prior chest x-ray 06/09/2017

CLINICAL DATA: 51-year-old female with productive cough, fever and
lethargy

EXAM:
PORTABLE CHEST 1 VIEW

[chest ap]
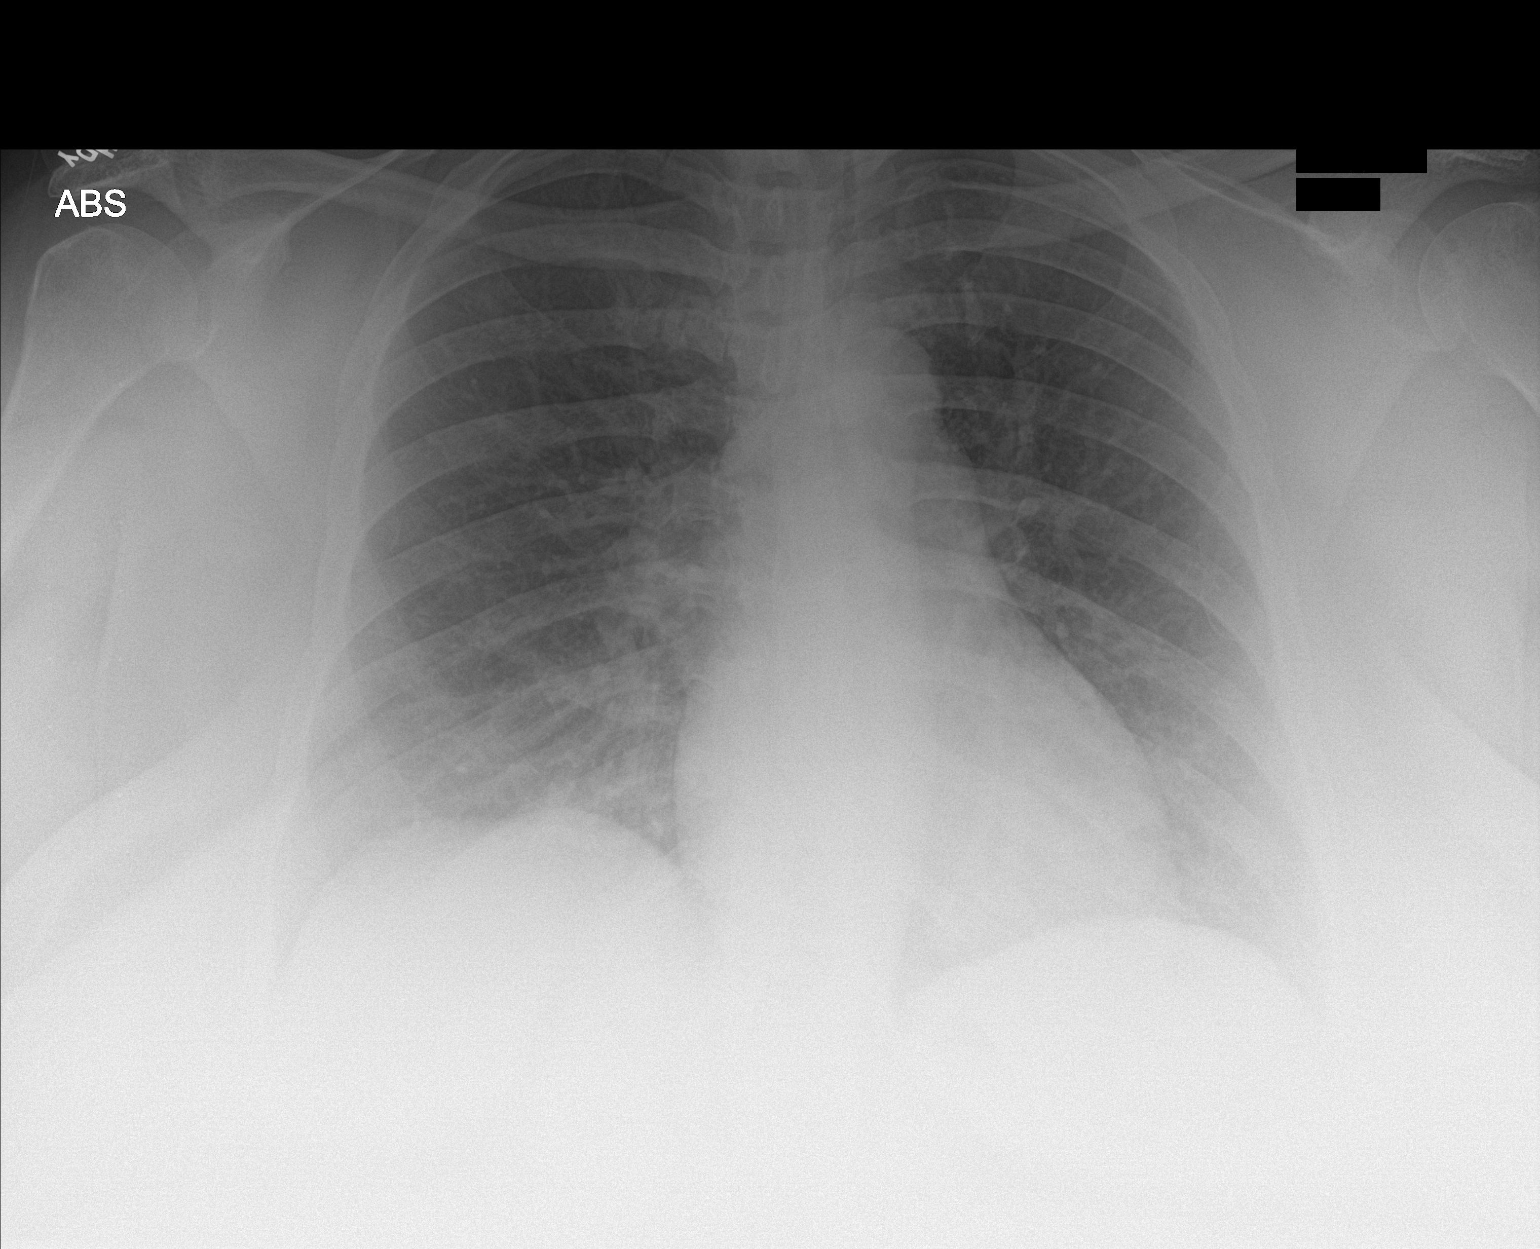

[1 of 1 positions shown; findings below may reference images not displayed]

FINDINGS: The lungs are clear and negative for focal airspace consolidation,
pulmonary edema or suspicious pulmonary nodule. No pleural effusion
or pneumothorax. Cardiac and mediastinal contours are within normal
limits. No acute fracture or lytic or blastic osseous lesions. The
visualized upper abdominal bowel gas pattern is unremarkable.
IMPRESSION: No active disease.

## 2018-03-29 ENCOUNTER — Inpatient Hospital Stay
Admission: EM | Admit: 2018-03-29 | Discharge: 2018-04-01 | DRG: 311 | Disposition: A | Discharge status: Home / Self Care | Payer: Medicaid Other | Attending: Internal Medicine | Admitting: Internal Medicine

## 2018-03-29 ENCOUNTER — Emergency Department: Payer: Medicaid Other

## 2018-03-29 ENCOUNTER — Encounter: Payer: Self-pay | Admitting: Emergency Medicine

## 2018-03-29 ENCOUNTER — Other Ambulatory Visit: Payer: Self-pay

## 2018-03-29 DIAGNOSIS — E782 Mixed hyperlipidemia: Secondary | ICD-10-CM | POA: Diagnosis present

## 2018-03-29 DIAGNOSIS — I208 Other forms of angina pectoris: Secondary | ICD-10-CM

## 2018-03-29 DIAGNOSIS — M1990 Unspecified osteoarthritis, unspecified site: Secondary | ICD-10-CM | POA: Diagnosis present

## 2018-03-29 DIAGNOSIS — G459 Transient cerebral ischemic attack, unspecified: Secondary | ICD-10-CM | POA: Diagnosis not present

## 2018-03-29 DIAGNOSIS — Z87891 Personal history of nicotine dependence: Secondary | ICD-10-CM

## 2018-03-29 DIAGNOSIS — I10 Essential (primary) hypertension: Secondary | ICD-10-CM | POA: Diagnosis present

## 2018-03-29 DIAGNOSIS — Z794 Long term (current) use of insulin: Secondary | ICD-10-CM

## 2018-03-29 DIAGNOSIS — Z8249 Family history of ischemic heart disease and other diseases of the circulatory system: Secondary | ICD-10-CM

## 2018-03-29 DIAGNOSIS — N289 Disorder of kidney and ureter, unspecified: Secondary | ICD-10-CM | POA: Diagnosis present

## 2018-03-29 DIAGNOSIS — G4733 Obstructive sleep apnea (adult) (pediatric): Secondary | ICD-10-CM | POA: Diagnosis present

## 2018-03-29 DIAGNOSIS — K760 Fatty (change of) liver, not elsewhere classified: Secondary | ICD-10-CM | POA: Diagnosis present

## 2018-03-29 DIAGNOSIS — R29703 NIHSS score 3: Secondary | ICD-10-CM | POA: Diagnosis not present

## 2018-03-29 DIAGNOSIS — Z8589 Personal history of malignant neoplasm of other organs and systems: Secondary | ICD-10-CM

## 2018-03-29 DIAGNOSIS — Z7982 Long term (current) use of aspirin: Secondary | ICD-10-CM

## 2018-03-29 DIAGNOSIS — I639 Cerebral infarction, unspecified: Secondary | ICD-10-CM

## 2018-03-29 DIAGNOSIS — Z9851 Tubal ligation status: Secondary | ICD-10-CM

## 2018-03-29 DIAGNOSIS — E1165 Type 2 diabetes mellitus with hyperglycemia: Secondary | ICD-10-CM | POA: Diagnosis present

## 2018-03-29 DIAGNOSIS — Z96651 Presence of right artificial knee joint: Secondary | ICD-10-CM | POA: Diagnosis present

## 2018-03-29 DIAGNOSIS — Z6841 Body Mass Index (BMI) 40.0 and over, adult: Secondary | ICD-10-CM

## 2018-03-29 DIAGNOSIS — R7989 Other specified abnormal findings of blood chemistry: Secondary | ICD-10-CM

## 2018-03-29 DIAGNOSIS — I209 Angina pectoris, unspecified: Secondary | ICD-10-CM

## 2018-03-29 DIAGNOSIS — R079 Chest pain, unspecified: Secondary | ICD-10-CM | POA: Diagnosis present

## 2018-03-29 DIAGNOSIS — J45909 Unspecified asthma, uncomplicated: Secondary | ICD-10-CM | POA: Diagnosis present

## 2018-03-29 DIAGNOSIS — Z8673 Personal history of transient ischemic attack (TIA), and cerebral infarction without residual deficits: Secondary | ICD-10-CM | POA: Diagnosis present

## 2018-03-29 LAB — BASIC METABOLIC PANEL
ANION GAP: 8 (ref 5–15)
BUN: 17 mg/dL (ref 6–20)
CALCIUM: 8.6 mg/dL — AB (ref 8.9–10.3)
CO2: 30 mmol/L (ref 22–32)
Chloride: 99 mmol/L — ABNORMAL LOW (ref 101–111)
Creatinine, Ser: 0.94 mg/dL (ref 0.44–1.00)
GFR calc Af Amer: >60 mL/min (ref >60)
GLUCOSE: 229 mg/dL — AB (ref 65–99)
POTASSIUM: 3.8 mmol/L (ref 3.5–5.1)
SODIUM: 137 mmol/L (ref 135–145)

## 2018-03-29 LAB — CBC
HEMATOCRIT: 38.6 % (ref 35.0–47.0)
HEMOGLOBIN: 12.9 g/dL (ref 12.0–16.0)
MCH: 31.2 pg (ref 26.0–34.0)
MCHC: 33.5 g/dL (ref 32.0–36.0)
MCV: 93.2 fL (ref 80.0–100.0)
Platelets: 229 10*3/uL (ref 150–440)
RBC: 4.14 MIL/uL (ref 3.80–5.20)
RDW: 14.3 % (ref 11.5–14.5)
WBC: 5.5 10*3/uL (ref 3.6–11.0)

## 2018-03-29 LAB — LIPID PANEL
CHOL/HDL RATIO: 4.8 ratio
Cholesterol: 174 mg/dL (ref 0–200)
HDL: 36 mg/dL — AB (ref >40)
LDL Cholesterol: 116 mg/dL — ABNORMAL HIGH (ref 0–99)
TRIGLYCERIDES: 112 mg/dL (ref <150)
VLDL: 22 mg/dL (ref 0–40)

## 2018-03-29 LAB — HEMOGLOBIN A1C
Hgb A1c MFr Bld: 11.8 % — ABNORMAL HIGH (ref 4.8–5.6)
Mean Plasma Glucose: 291.96 mg/dL

## 2018-03-29 LAB — TROPONIN I: Troponin I: <0.03 ng/mL (ref <0.03)

## 2018-03-29 LAB — FIBRIN DERIVATIVES D-DIMER (ARMC ONLY): FIBRIN DERIVATIVES D-DIMER (ARMC): 3821.19 ng{FEU}/mL — AB (ref 0.00–499.00)

## 2018-03-29 LAB — GLUCOSE, CAPILLARY: GLUCOSE-CAPILLARY: 175 mg/dL — AB (ref 65–99)

## 2018-03-29 MED ORDER — GABAPENTIN 300 MG PO CAPS
300.0000 mg | ORAL_CAPSULE | Freq: Three times a day (TID) | ORAL | Status: DC
  Administered 2018-03-29 – 2018-04-01 (×8): 300 mg via ORAL
  Filled 2018-03-29 (×8): qty 1

## 2018-03-29 MED ORDER — IOPAMIDOL (ISOVUE-370) INJECTION 76%
75.0000 mL | Freq: Once | INTRAVENOUS | Status: AC | PRN
  Administered 2018-03-29: 75 mL via INTRAVENOUS

## 2018-03-29 MED ORDER — SIMVASTATIN 20 MG PO TABS
20.0000 mg | ORAL_TABLET | Freq: Every day | ORAL | Status: DC
  Administered 2018-03-29 – 2018-03-30 (×2): 20 mg via ORAL
  Filled 2018-03-29 (×3): qty 1

## 2018-03-29 MED ORDER — SODIUM CHLORIDE 0.9% FLUSH
3.0000 mL | Freq: Two times a day (BID) | INTRAVENOUS | Status: DC
  Administered 2018-03-29 – 2018-04-01 (×6): 3 mL via INTRAVENOUS

## 2018-03-29 MED ORDER — ASPIRIN EC 81 MG PO TBEC
81.0000 mg | DELAYED_RELEASE_TABLET | Freq: Every day | ORAL | Status: DC
  Administered 2018-03-29 – 2018-04-01 (×4): 81 mg via ORAL
  Filled 2018-03-29 (×4): qty 1

## 2018-03-29 MED ORDER — NITROGLYCERIN 0.4 MG SL SUBL
0.4000 mg | SUBLINGUAL_TABLET | SUBLINGUAL | Status: DC | PRN
  Administered 2018-03-29 (×2): 0.4 mg via SUBLINGUAL
  Filled 2018-03-29: qty 1

## 2018-03-29 MED ORDER — HYDRALAZINE HCL 20 MG/ML IJ SOLN
10.0000 mg | Freq: Four times a day (QID) | INTRAMUSCULAR | Status: DC | PRN
  Administered 2018-03-29: 10 mg via INTRAVENOUS
  Filled 2018-03-29: qty 1

## 2018-03-29 MED ORDER — MELOXICAM 7.5 MG PO TABS
7.5000 mg | ORAL_TABLET | Freq: Two times a day (BID) | ORAL | Status: DC
  Administered 2018-03-29 – 2018-04-01 (×6): 7.5 mg via ORAL
  Filled 2018-03-29 (×7): qty 1

## 2018-03-29 MED ORDER — INSULIN GLARGINE 100 UNIT/ML ~~LOC~~ SOLN
100.0000 [IU] | Freq: Every day | SUBCUTANEOUS | Status: DC
  Administered 2018-03-29 – 2018-03-30 (×2): 100 [IU] via SUBCUTANEOUS
  Filled 2018-03-29 (×3): qty 1

## 2018-03-29 MED ORDER — NITROGLYCERIN 0.4 MG SL SUBL
0.4000 mg | SUBLINGUAL_TABLET | SUBLINGUAL | Status: DC | PRN
  Administered 2018-03-29: 0.4 mg via SUBLINGUAL
  Filled 2018-03-29: qty 1

## 2018-03-29 MED ORDER — INSULIN ASPART 100 UNIT/ML ~~LOC~~ SOLN
0.0000 [IU] | Freq: Three times a day (TID) | SUBCUTANEOUS | Status: DC
  Administered 2018-03-30 (×2): 2 [IU] via SUBCUTANEOUS
  Administered 2018-03-31: 1 [IU] via SUBCUTANEOUS
  Administered 2018-03-31 – 2018-04-01 (×3): 2 [IU] via SUBCUTANEOUS
  Filled 2018-03-29 (×5): qty 1

## 2018-03-29 MED ORDER — ASPIRIN 81 MG PO CHEW
324.0000 mg | CHEWABLE_TABLET | Freq: Once | ORAL | Status: AC
  Administered 2018-03-29: 324 mg via ORAL
  Filled 2018-03-29: qty 4

## 2018-03-29 MED ORDER — DOCUSATE SODIUM 100 MG PO CAPS: 100.0000 mg | ORAL_CAPSULE | Freq: Two times a day (BID) | ORAL | Status: DC | PRN

## 2018-03-29 MED ORDER — METOPROLOL TARTRATE 25 MG PO TABS
12.5000 mg | ORAL_TABLET | Freq: Two times a day (BID) | ORAL | Status: DC
  Administered 2018-03-29 – 2018-04-01 (×5): 12.5 mg via ORAL
  Filled 2018-03-29 (×5): qty 1

## 2018-03-29 MED ORDER — TRAZODONE HCL 50 MG PO TABS
50.0000 mg | ORAL_TABLET | Freq: Every day | ORAL | Status: DC
  Administered 2018-03-29 – 2018-03-31 (×3): 50 mg via ORAL
  Filled 2018-03-29 (×3): qty 1

## 2018-03-29 MED ORDER — ATORVASTATIN CALCIUM 20 MG PO TABS
40.0000 mg | ORAL_TABLET | Freq: Every day | ORAL | Status: DC
  Administered 2018-03-29 – 2018-04-01 (×4): 40 mg via ORAL
  Filled 2018-03-29 (×5): qty 2

## 2018-03-29 MED ORDER — ORPHENADRINE CITRATE ER 100 MG PO TB12
100.0000 mg | ORAL_TABLET | Freq: Two times a day (BID) | ORAL | Status: DC | PRN
Start: 2018-03-29 — End: 2018-04-01
  Filled 2018-03-29: qty 1

## 2018-03-29 MED ORDER — FUROSEMIDE 40 MG PO TABS
40.0000 mg | ORAL_TABLET | Freq: Every day | ORAL | Status: DC
  Administered 2018-03-29 – 2018-04-01 (×4): 40 mg via ORAL
  Filled 2018-03-29 (×3): qty 1
  Filled 2018-03-29: qty 2

## 2018-03-29 MED ORDER — POTASSIUM CHLORIDE CRYS ER 10 MEQ PO TBCR
10.0000 meq | EXTENDED_RELEASE_TABLET | Freq: Every day | ORAL | Status: DC
  Administered 2018-03-29 – 2018-03-31 (×3): 10 meq via ORAL
  Filled 2018-03-29 (×3): qty 1

## 2018-03-29 MED ORDER — LISINOPRIL 20 MG PO TABS
40.0000 mg | ORAL_TABLET | Freq: Every evening | ORAL | Status: DC
  Administered 2018-03-29 – 2018-03-31 (×3): 40 mg via ORAL
  Filled 2018-03-29 (×2): qty 2

## 2018-03-29 MED ORDER — ALBUTEROL SULFATE (2.5 MG/3ML) 0.083% IN NEBU: 2.5000 mg | INHALATION_SOLUTION | Freq: Four times a day (QID) | RESPIRATORY_TRACT | Status: DC | PRN

## 2018-03-29 MED ORDER — HEPARIN SODIUM (PORCINE) 5000 UNIT/ML IJ SOLN
5000.0000 [IU] | Freq: Three times a day (TID) | INTRAMUSCULAR | Status: DC
  Administered 2018-03-29 – 2018-04-01 (×8): 5000 [IU] via SUBCUTANEOUS
  Filled 2018-03-29 (×8): qty 1

## 2018-03-29 MED ORDER — INSULIN ASPART 100 UNIT/ML ~~LOC~~ SOLN
10.0000 [IU] | Freq: Three times a day (TID) | SUBCUTANEOUS | Status: DC
  Administered 2018-03-30 – 2018-04-01 (×5): 10 [IU] via SUBCUTANEOUS
  Filled 2018-03-29 (×5): qty 1

## 2018-03-29 NOTE — ED Notes (Signed)
Admitting MD at bedside.

## 2018-03-29 NOTE — ED Notes (Signed)
Recollect green and blue top sent to lab

## 2018-03-29 NOTE — ED Triage Notes (Signed)
Pt c/o left chest pain that radiates into the center of her back. Pt states that the pain radiates into her left shoulder and neck. Pt also c/o nausea and shortness of breath. Pt denies cardiac history. Pt is in NAD at this time.

## 2018-03-29 NOTE — ED Provider Notes (Signed)
[facility] Emergency Department Provider Note  ____________________________________________  Time seen: Approximately 12:37 PM  I have reviewed the triage vital signs and the nursing notes.   HISTORY  Chief Complaint Chest Pain    HPI Brenda Hicks is a 52 y.o. female with a history of diabetes hypertension and morbid obesity who complains of chest pain. Chest pain started 3 days ago, intermittent lasting 5-10 minutes at a time, worse with exertion, better with rest, radiating to her back and left shoulder and left jaw. Worse with deep breathing.  Denies any recent travel trauma hospitalization or surgery. No history of DVT or PE. Pain is not tearing or steering although it does radiate. No extremity weakness or paresthesia. No visual changes or headaches. No syncope.  pain is associated with shortness of breath. No vomiting or diaphoresis.   Past Medical History:  Diagnosis Date  . Arthritis   . Asthma    WELL CONTROLLED  . Cancer of ear   . Diabetes mellitus without complication ([place])   . Fatty liver   . Hypertension   . Kidney cysts   . Renal disorder   . Sleep apnea    USES CPAP     Patient Active Problem List   Diagnosis Date Noted  . Sepsis affecting skin 06/09/2017  . Sebaceous cyst 06/01/2016  . Obstructive apnea 05/31/2016  . HLD (hyperlipidemia) 05/31/2016  . Acid reflux 05/31/2016  . Essential (primary) hypertension 05/31/2016  . Abnormal Pap smear of cervix 05/31/2016  . Arthritis of knee, degenerative 07/25/2015  . History of artificial joint 07/25/2015  . Gonalgia 02/17/2015  . Type 2 diabetes mellitus ([place]) 10/23/2014  . Otalgia of left ear 09/24/2014  . 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 Diabetic peripheral neuropathy associated with type 2 diabetes mellitus ([place]) 05/16/2014  . Endometrial polyp 11/01/2013  . Fibroids, intramural  10/09/2013  . Pain due to knee joint prosthesis ([place]) 10/05/2013  . Body mass index (BMI) of 50-59.9 in adult ([place]) 10/03/2013  . Abnormal uterine bleeding 10/03/2013  . Adiposity 10/01/2013  . Excessive and frequent menstruation with irregular cycle 10/01/2013  . Adaptive colitis 10/01/2013  . History of migraine headaches 10/01/2013  . H/O malignant neoplasm of skin 10/01/2013  . Fatty liver disease, nonalcoholic 54/62/7035  . Diverticulitis 10/01/2013  . Current tobacco use 08/29/2013  . H/O total knee replacement 08/29/2013  . Cannot sleep 08/29/2013  . Dysmenorrhea 08/29/2013  . Airway hyperreactivity 08/29/2013  . Absolute anemia 08/29/2013  . Tobacco use 08/29/2013     Past Surgical History:  Procedure Laterality Date  . DILATION AND CURETTAGE OF UTERUS    . ENDOMETRIAL BIOPSY    . EXPLORATORY LAPAROTOMY     REMOVAL OF RUPTURED ECTOPIC  . HAND SURGERY    . HERNIA REPAIR     UMBILICAL  . JOINT REPLACEMENT Right    TKR  . KNEE ARTHROSCOPY    . KNEE SURGERY Right   . SHOULDER ARTHROSCOPY WITH BICEPSTENOTOMY Left 12/14/2016   Procedure: SHOULDER ARTHROSCOPY WITH BICEPSTENOTOMY;  Surgeon: Corky Mull, MD;  Location: [facility] ORS;  Service: Orthopedics;  Laterality: Left;  . SHOULDER ARTHROSCOPY WITH OPEN ROTATOR CUFF REPAIR Left 12/14/2016   Procedure: SHOULDER ARTHROSCOPY WITH OPEN ROTATOR CUFF REPAIR AND ARTHROSCOPIC ROTATOR CUFF REPAIR;  Surgeon: Corky Mull, MD;  Location: [facility] ORS;  Service: Orthopedics;  Laterality: Left;  . SHOULDER ARTHROSCOPY WITH SUBACROMIAL DECOMPRESSION Left 12/14/2016  Procedure: SHOULDER ARTHROSCOPY WITH SUBACROMIAL DECOMPRESSION;  Surgeon: Corky Mull, MD;  Location: [facility] ORS;  Service: Orthopedics;  Laterality: Left;  . TUBAL LIGATION       Prior to Admission medications   Medication Sig Start Date End Date Taking? Authorizing Provider  albuterol (PROVENTIL HFA;VENTOLIN HFA) 108 (90 Base) MCG/ACT inhaler Inhale 2 puffs into the lungs every  6 (six) hours as needed for wheezing or shortness of breath.    [provider]  aspirin EC 81 MG tablet Take 81 mg by mouth daily.  01/14/16   [provider]  atorvastatin (LIPITOR) 40 MG tablet Take 1 tablet (40 mg total) by mouth daily. 12/07/15 01/30/18  Johnn Hai, PA-C  doxycycline (VIBRA-TABS) 100 MG tablet Take 1 tablet (100 mg total) by mouth every 12 (twelve) hours. Patient not taking: Reported on 01/30/2018 06/15/17   Hillary Bow, MD  Elastic Bandages & Supports (MEDICAL COMPRESSION STOCKINGS) MISC 2 medium stockings Use as directed on both lower legs 01/30/18   Arta Silence, MD  FLOVENT HFA 220 MCG/ACT inhaler Inhale 2 puffs into the lungs 2 (two) times daily. 01/25/18   [provider]  gabapentin (NEURONTIN) 300 MG capsule Take 300 mg by mouth 3 (three) times daily.     [provider]  HYDROcodone-acetaminophen (NORCO/VICODIN) 5-325 MG tablet Take 1 tablet by mouth every 8 (eight) hours as needed for moderate pain. 11/22/17   Johnn Hai, PA-C  insulin glargine (LANTUS) 100 UNIT/ML injection Inject 1 mL (100 Units total) into the skin at bedtime. 11/22/17   Johnn Hai, PA-C  insulin lispro (HUMALOG) 100 UNIT/ML injection Inject 0.2 mLs (20 Units total) into the skin 3 (three) times daily with meals. Patient taking differently: Inject 40 Units into the skin 3 (three) times daily with meals.  11/22/17 04/18/19  Johnn Hai, PA-C  lisinopril (PRINIVIL,ZESTRIL) 40 MG tablet Take 40 mg by mouth every evening.  01/14/16 01/30/18  [provider]  meloxicam (MOBIC) 7.5 MG tablet Take 1 tablet by mouth 2 (two) times daily. 01/25/18   [provider]  metFORMIN (GLUCOPHAGE) 1000 MG tablet Take 1 tablet by mouth 2 (two) times daily. 01/25/18   [provider]  orphenadrine (NORFLEX) 100 MG tablet Take 1 tablet (100 mg total) by mouth 2 (two) times daily as needed for muscle spasms. 11/22/17   Johnn Hai, PA-C   sitaGLIPtin (JANUVIA) 100 MG tablet Take 100 mg by mouth daily. Patient reports taking Januvia 100 mg daily    [provider]  STIOLTO RESPIMAT 2.5-2.5 MCG/ACT AERS Take 2 puffs by mouth daily. 01/26/18   [provider]  tiZANidine (ZANAFLEX) 4 MG tablet Take 1 tablet by mouth 3 (three) times daily. 01/25/18   [provider]  traZODone (DESYREL) 50 MG tablet Take 1 tablet by mouth at bedtime. 01/25/18   [provider]  zolpidem (AMBIEN) 5 MG tablet Take 1 tablet (5 mg total) by mouth at bedtime as needed for sleep. Patient not taking: Reported on 01/30/2018 12/14/16   Poggi, Marshall Cork, MD     Allergies Patient has no known allergies.   No family history on file.  Social History Social History   Tobacco Use  . Smoking status: Former Smoker    Packs/day: 0.50    Years: 25.00    Pack years: 12.50    Types: Cigarettes  . Smokeless tobacco: Never Used  Substance Use Topics  . Alcohol use: No  . Drug use:  No    Review of Systems  Constitutional:   No fever or chills.  ENT:   No sore throat. No rhinorrhea. Cardiovascular:   positive as above chest pain without syncope. Respiratory:   positive shortness of breath without cough. Gastrointestinal:   Negative for abdominal pain, vomiting and diarrhea.  Musculoskeletal:   Negative for focal pain or swelling All other systems reviewed and are negative except as documented above in ROS and HPI.  ____________________________________________   PHYSICAL EXAM:  VITAL SIGNS: ED Triage Vitals  Enc Vitals Group     BP 03/29/18 0932 (!) 174/92     Pulse Rate 03/29/18 0932 78     Resp 03/29/18 0932 16     Temp 03/29/18 0932 98.5 F (36.9 C)     Temp src --      SpO2 03/29/18 0932 98 %     Weight 03/29/18 0930 (!) 302 lb (137 kg)     Height --      Head Circumference --      Peak Flow --      Pain Score 03/29/18 0930 10     Pain Loc --      Pain Edu? --      Excl. in [place]? --     Vital signs  reviewed, nursing assessments reviewed.   Constitutional:   Alert and oriented. not in distress Eyes:   Conjunctivae are normal. EOMI. PERRL. ENT      Head:   Normocephalic and atraumatic.2 cm enlarged posterior auricular lymph node, tender to touch, not hot or fixed.      Nose:   No congestion/rhinnorhea.       Mouth/Throat:   MMM, no pharyngeal erythema. No peritonsillar mass.       Neck:   No meningismus. Full ROM. Hematological/Lymphatic/Immunilogical:   No cervical lymphadenopathy. Cardiovascular:   RRR. Symmetric bilateral radial and DP pulses.  No murmurs.  Respiratory:   Normal respiratory effort without tachypnea/retractions. Breath sounds are clear and equal bilaterally. No wheezes/rales/rhonchi. Gastrointestinal:   Soft and nontender. Non distended. There is no CVA tenderness.  No rebound, rigidity, or guarding.  Musculoskeletal:   Normal range of motion in all extremities. No joint effusions.  No lower extremity tenderness.  No edema.symmetric calf circumference Neurologic:   Normal speech and language.  Motor grossly intact. No acute focal neurologic deficits are appreciated.  Skin:    Skin is warm, dry and intact. No rash noted.  No petechiae, purpura, or bullae.  ____________________________________________    LABS (pertinent positives/negatives) (all labs ordered are listed, but only abnormal results are displayed) Labs Reviewed  BASIC METABOLIC PANEL - Abnormal; Notable for the following components:      Result Value   Chloride 99 (*)    Glucose, Bld 229 (*)    Calcium 8.6 (*)    All other components within normal limits  FIBRIN DERIVATIVES D-DIMER ([facility] ONLY) - Abnormal; Notable for the following components:   Fibrin derivatives D-dimer [facility]) 3,821.19 (*)    All other components within normal limits  CBC  TROPONIN I  TROPONIN I   ____________________________________________   EKG  interpreted by me Normal sinus rhythm rate of 71, normal axis and  intervals. Normal QRS ST segments and T waves.  ____________________________________________    RADIOLOGY  Dg Chest 2 View  Result Date: 03/29/2018 CLINICAL DATA:  Chest pain. EXAM: CHEST - 2 VIEW COMPARISON:  Radiographs of January 30, 2018. FINDINGS: The heart size and mediastinal contours are  within normal limits. Both lungs are clear. No pneumothorax or pleural effusion is noted. The visualized skeletal structures are unremarkable. IMPRESSION: No active cardiopulmonary disease. Electronically Signed   By: Marijo Conception, M.D.   On: 03/29/2018 10:18   Ct Angio Chest Pe W And/or Wo Contrast  Result Date: 03/29/2018 CLINICAL DATA:  Chest pain and shortness of breath EXAM: CT ANGIOGRAPHY CHEST WITH CONTRAST TECHNIQUE: Multidetector CT imaging of the chest was performed using the standard protocol during bolus administration of intravenous contrast. Multiplanar CT image reconstructions and MIPs were obtained to evaluate the vascular anatomy. CONTRAST:  21mL ISOVUE-370 IOPAMIDOL (ISOVUE-370) INJECTION 76% COMPARISON:  Chest radiograph 03/29/2018 FINDINGS: Cardiovascular: Contrast injection is sufficient to demonstrate satisfactory opacification of the pulmonary arteries to the segmental level. There is no pulmonary embolus. The main pulmonary artery is within normal limits for size. There is a normal 3-vessel arch branching pattern without evidence of acute aortic syndrome. There is noaortic atherosclerosis. Heart size is normal, without pericardial effusion. Mediastinum/Nodes: No mediastinal, hilar or axillary lymphadenopathy. The visualized thyroid and thoracic esophageal course are unremarkable. Lungs/Pleura: No pulmonary nodules or masses. No pleural effusion or pneumothorax. No focal airspace consolidation. No focal pleural abnormality. Upper Abdomen: Contrast bolus timing is not optimized for evaluation of the abdominal organs. Hepatomegaly with hepatic steatosis. Musculoskeletal: No chest wall  abnormality. No acute or significant osseous findings. Review of the MIP images confirms the above findings. IMPRESSION: 1. No pulmonary embolus or acute aortic syndrome. 2. Clear lungs. 3. Hepatomegaly and attic steatosis. Electronically Signed   By: Ulyses Jarred M.D.   On: 03/29/2018 17:19    ____________________________________________   PROCEDURES Procedures  ____________________________________________  DIFFERENTIAL DIAGNOSIS   non-STEMI, unstable angina, PE, dissection, pleurisy, GERD  CLINICAL IMPRESSION / ASSESSMENT AND PLAN / ED COURSE  Pertinent labs & imaging results that were available during my care of the patient were reviewed by me and considered in my medical decision making (see chart for details).      Clinical Course as of Mar 29 1726  Wed Mar 29, 2018  1230 Pt p/w accelerating chest pain x 3 days with h/o exertional / anginal chest pain x 2 months. Low suspicion of PE or TAD but remains on the differential.  Will check d-dimer. If negative will hospitalize for cards workup. If pain not relieved with nitro will start heparin.    [PS]  1422 Chest pain resolved after nitroglycerin. Waiting for d dimer   [PS]  1443 D-dimer very high. Will obtain CTA chest. No hypoxia or shock currently.    [PS]  5465 Still waiting for CT. VSS. hypertensive   [PS]  1726 CTA chest negative. Will d/w hospitalist for further eval.    [PS]    Clinical Course User Index [PS] Carrie Mew, MD     ____________________________________________   FINAL CLINICAL IMPRESSION(S) / ED DIAGNOSES    Final diagnoses:  Exertional chest pain     ED Discharge Orders    None      Portions of this note were generated with dragon dictation software. Dictation errors may occur despite best attempts at proofreading.    Carrie Mew, MD 03/29/18 1728

## 2018-03-29 NOTE — ED Notes (Signed)
Pt up to toilet 

## 2018-03-29 NOTE — ED Notes (Signed)
Pt given meal tray.

## 2018-03-29 NOTE — ED Notes (Signed)
Patient transported to CT 

## 2018-03-29 NOTE — Progress Notes (Signed)
Pt in room from E.D. Elevated blood pressures 194/119, MD notified, waiting for orders and medication verification from pharmacy, pt is stable and voices no complaints at this time, dinner tray ordered. Will continue to monitor

## 2018-03-29 NOTE — H&P (Signed)
[facility] NAME: Brenda Hicks    MR#:  [id number]  DATE OF BIRTH:  Jun 17, 1966  DATE OF ADMISSION:  03/29/2018  PRIMARY CARE PHYSICIAN: Lorelee Market, MD   REQUESTING/REFERRING PHYSICIAN: Joni Fears  CHIEF COMPLAINT:   Chief Complaint  Patient presents with  . Chest Pain    HISTORY OF PRESENT ILLNESS: Brenda Hicks  is a 52 y.o. female with a known history of DM, Htn, HLD, Obesity, Smoking in past- came to emergency room after having intermittent chest pain for last 2-3 days. The pain was in her center of the chest radiating to her back, getting worse with any kind of activities and some positions. It was not associated with any cough or shortness of breath. The pain was pressure-like as per her description. There are no relieving factors for her pain at home. She came to emergency room and received nitroglycerin tablet under the tongue and it helped to relieve her pain. Her 2 troponins and EKG were negative in emergency room but because of her typical pain history and being a high-risk for coronary artery disease, ER physician suggested to do further cardiac workup.  PAST MEDICAL HISTORY:   Past Medical History:  Diagnosis Date  . Arthritis   . Asthma    WELL CONTROLLED  . Cancer of ear   . Diabetes mellitus without complication ([place])   . Fatty liver   . Hypertension   . Kidney cysts   . Renal disorder   . Sleep apnea    USES CPAP    PAST SURGICAL HISTORY:  Past Surgical History:  Procedure Laterality Date  . DILATION AND CURETTAGE OF UTERUS    . ENDOMETRIAL BIOPSY    . EXPLORATORY LAPAROTOMY     REMOVAL OF RUPTURED ECTOPIC  . HAND SURGERY    . HERNIA REPAIR     UMBILICAL  . JOINT REPLACEMENT Right    TKR  . KNEE ARTHROSCOPY    . KNEE SURGERY Right   . SHOULDER ARTHROSCOPY WITH BICEPSTENOTOMY Left 12/14/2016   Procedure: SHOULDER ARTHROSCOPY WITH BICEPSTENOTOMY;  Surgeon: Corky Mull, MD;  Location: [facility] ORS;   Service: Orthopedics;  Laterality: Left;  . SHOULDER ARTHROSCOPY WITH OPEN ROTATOR CUFF REPAIR Left 12/14/2016   Procedure: SHOULDER ARTHROSCOPY WITH OPEN ROTATOR CUFF REPAIR AND ARTHROSCOPIC ROTATOR CUFF REPAIR;  Surgeon: Corky Mull, MD;  Location: [facility] OR[facility];  Service: Orthopedics;  Laterality: Left;  . SHOULDER ARTHROSCOPY WITH SUBACROMIAL DECOMPRESSION Left 12/14/2016   Procedure: SHOULDER ARTHROSCOPY WITH SUBACROMIAL DECOMPRESSION;  Surgeon: Corky Mull, MD;  Location: [facility] OR[facility];  Service: Orthopedics;  Laterality: Left;  . TUBAL LIGATION      SOCIAL HISTORY:  Social History   Tobacco Use  . Smoking status: Former Smoker    Packs/day: 2.00    Years: 25.00    Pack years: 50.00    Types: Cigarettes  . Smokeless tobacco: Never Used  Substance Use Topics  . Alcohol use: No    FAMILY HISTORY:  Family History  Problem Relation Age of Onset  . CAD Maternal Grandmother     DRUG ALLERGIES: No Known Allergies  REVIEW OF SYSTEMS:   CONSTITUTIONAL: No fever, fatigue or weakness.  EYES: No blurred or double vision.  EARS, NOSE, AND THROAT: No tinnitus or ear pain.  RESPIRATORY: No cough, shortness of breath, wheezing or hemoptysis.  CARDIOVASCULAR: She had chest pain, no orthopnea, edema.  GASTROINTESTINAL: No nausea, vomiting, diarrhea or abdominal pain.  GENITOURINARY:  No dysuria, hematuria.  ENDOCRINE: No polyuria, nocturia,  HEMATOLOGY: No anemia, easy bruising or bleeding SKIN: No rash or lesion. MUSCULOSKELETAL: No joint pain or arthritis.   NEUROLOGIC: No tingling, numbness, weakness.  PSYCHIATRY: No anxiety or depression.   MEDICATIONS AT HOME:  Prior to Admission medications   Medication Sig Start Date End Date Taking? Authorizing Provider  albuterol (PROVENTIL HFA;VENTOLIN HFA) 108 (90 Base) MCG/ACT inhaler Inhale 2 puffs into the lungs every 6 (six) hours as needed for wheezing or shortness of breath.   Yes [provider]  aspirin EC 81 MG tablet Take 81  mg by mouth daily.  01/14/16  Yes [provider]  atorvastatin (LIPITOR) 40 MG tablet Take 1 tablet (40 mg total) by mouth daily. 12/07/15 03/29/18 Yes Summers, Rhonda L, PA-C  FLOVENT HFA 220 MCG/ACT inhaler Inhale 2 puffs into the lungs 2 (two) times daily. 01/25/18  Yes [provider]  furosemide (LASIX) 40 MG tablet Take 1 tablet by mouth daily. 01/31/18  Yes [provider]  gabapentin (NEURONTIN) 300 MG capsule Take 300 mg by mouth 3 (three) times daily.    Yes [provider]  insulin glargine (LANTUS) 100 UNIT/ML injection Inject 1 mL (100 Units total) into the skin at bedtime. 11/22/17  Yes Letitia Neri L, PA-C  insulin lispro (HUMALOG) 100 UNIT/ML injection Inject 0.2 mLs (20 Units total) into the skin 3 (three) times daily with meals. 11/22/17 04/18/19 Yes Letitia Neri L, PA-C  lisinopril (PRINIVIL,ZESTRIL) 40 MG tablet Take 40 mg by mouth every evening.  01/14/16 03/29/18 Yes [provider]  metFORMIN (GLUCOPHAGE) 1000 MG tablet Take 1 tablet by mouth 2 (two) times daily. 01/25/18  Yes [provider]  potassium chloride (K-DUR) 10 MEQ tablet Take 1 tablet by mouth daily. 01/31/18  Yes [provider]  simvastatin (ZOCOR) 20 MG tablet Take 1 tablet by mouth daily. 01/31/18  Yes [provider]  sitaGLIPtin (JANUVIA) 100 MG tablet Take 100 mg by mouth daily. Patient reports taking Januvia 100 mg daily   Yes [provider]  STIOLTO RESPIMAT 2.5-2.5 MCG/ACT AERS Take 2 puffs by mouth daily. 01/26/18  Yes [provider]  traZODone (DESYREL) 50 MG tablet Take 1 tablet by mouth at bedtime. 01/25/18  Yes [provider]  doxycycline (VIBRA-TABS) 100 MG tablet Take 1 tablet (100 mg total) by mouth every 12 (twelve) hours. Patient not taking: Reported on 01/30/2018 06/15/17   Hillary Bow, MD  Elastic Bandages & Supports (MEDICAL COMPRESSION STOCKINGS) MISC 2 medium stockings Use as directed on both lower legs  01/30/18   Arta Silence, MD  HYDROcodone-acetaminophen (NORCO/VICODIN) 5-325 MG tablet Take 1 tablet by mouth every 8 (eight) hours as needed for moderate pain. Patient not taking: Reported on 03/29/2018 11/22/17   Johnn Hai, PA-C  meloxicam (MOBIC) 7.5 MG tablet Take 1 tablet by mouth 2 (two) times daily. 01/25/18   [provider]  orphenadrine (NORFLEX) 100 MG tablet Take 1 tablet (100 mg total) by mouth 2 (two) times daily as needed for muscle spasms. 11/22/17   Johnn Hai, PA-C  tiZANidine (ZANAFLEX) 4 MG tablet Take 1 tablet by mouth every 8 (eight) hours as needed for muscle spasms.  01/25/18   [provider]  zolpidem (AMBIEN) 5 MG tablet Take 1 tablet (5 mg total) by mouth at bedtime as needed for sleep. Patient not taking: Reported on 01/30/2018 12/14/16   Poggi, Marshall Cork, MD      PHYSICAL EXAMINATION:  VITAL SIGNS: Blood pressure (!) 167/95, pulse 87, temperature 98.5 F (36.9 C), resp. rate (!) 22, weight (!) 137 kg (302 lb), SpO2 98 %.  GENERAL:  52 y.o.-year-old patient lying in the bed with no acute distress.  EYES: Pupils equal, round, reactive to light and accommodation. No scleral icterus. Extraocular muscles intact.  HEENT: Head atraumatic, normocephalic. Oropharynx and nasopharynx clear.  NECK:  Supple, no jugular venous distention. No thyroid enlargement, no tenderness.  LUNGS: Normal breath sounds bilaterally, no wheezing, rales,rhonchi or crepitation. No use of accessory muscles of respiration.  CARDIOVASCULAR: S1, S2 normal. No murmurs, rubs, or gallops.  ABDOMEN: Soft, nontender, nondistended. Bowel sounds present. No organomegaly or mass.  EXTREMITIES: No pedal edema, cyanosis, or clubbing.  NEUROLOGIC: Cranial nerves II through XII are intact. Muscle strength 5/5 in all extremities. Sensation intact. Gait not checked.  PSYCHIATRIC: The patient is alert and oriented x 3.  SKIN: No obvious rash, lesion, or ulcer.   LABORATORY PANEL:    CBC Recent Labs  Lab 03/29/18 0945  WBC 5.5  HGB 12.9  HCT 38.6  PLT 229  MCV 93.2  MCH 31.2  MCHC 33.5  RDW 14.3   ------------------------------------------------------------------------------------------------------------------  Chemistries  Recent Labs  Lab 03/29/18 0945  NA 137  K 3.8  CL 99*  CO2 30  GLUCOSE 229*  BUN 17  CREATININE 0.94  CALCIUM 8.6*   ------------------------------------------------------------------------------------------------------------------ estimated creatinine clearance is 102.2 mL/min (by C-G formula based on SCr of 0.94 mg/dL). ------------------------------------------------------------------------------------------------------------------ No results for input(s): TSH, T4TOTAL, T3FREE, THYROIDAB in the last 72 hours.  Invalid input(s): FREET3   Coagulation profile No results for input(s): INR, PROTIME in the last 168 hours. ------------------------------------------------------------------------------------------------------------------- No results for input(s): DDIMER in the last 72 hours. -------------------------------------------------------------------------------------------------------------------  Cardiac Enzymes Recent Labs  Lab 03/29/18 0945 03/29/18 1347  TROPONINI <0.03 <0.03   ------------------------------------------------------------------------------------------------------------------ Invalid input(s): POCBNP  ---------------------------------------------------------------------------------------------------------------  Urinalysis    Component Value Date/Time   COLORURINE STRAW (A) 01/30/2018 1530   APPEARANCEUR CLEAR (A) 01/30/2018 1530   APPEARANCEUR Clear 03/07/2014 1603   LABSPEC 1.021 01/30/2018 1530   LABSPEC 1.012 03/07/2014 1603   PHURINE 5.0 01/30/2018 1530   GLUCOSEU >=500 (A) 01/30/2018 1530   GLUCOSEU Negative 03/07/2014 1603   HGBUR NEGATIVE 01/30/2018 1530   BILIRUBINUR NEGATIVE  01/30/2018 1530   BILIRUBINUR Negative 03/07/2014 1603   KETONESUR NEGATIVE 01/30/2018 1530   PROTEINUR NEGATIVE 01/30/2018 1530   NITRITE NEGATIVE 01/30/2018 1530   LEUKOCYTESUR NEGATIVE 01/30/2018 1530   LEUKOCYTESUR Negative 03/07/2014 1603     RADIOLOGY: Dg Chest 2 View  Result Date: 03/29/2018 CLINICAL DATA:  Chest pain. EXAM: CHEST - 2 VIEW COMPARISON:  Radiographs of January 30, 2018. FINDINGS: The heart size and mediastinal contours are within normal limits. Both lungs are clear. No pneumothorax or pleural effusion is noted. The visualized skeletal structures are unremarkable. IMPRESSION: No active cardiopulmonary disease. Electronically Signed   By: Marijo Conception, M.D.   On: 03/29/2018 10:18   Ct Angio Chest Pe W And/or Wo Contrast  Result Date: 03/29/2018 CLINICAL DATA:  Chest pain and shortness of breath EXAM: CT ANGIOGRAPHY CHEST WITH CONTRAST TECHNIQUE: Multidetector CT imaging of the chest was performed using the standard protocol during bolus administration of intravenous contrast. Multiplanar CT image reconstructions and MIPs were obtained to evaluate the vascular anatomy. CONTRAST:  44mL ISOVUE-370 IOPAMIDOL (ISOVUE-370) INJECTION 76% COMPARISON:  Chest radiograph 03/29/2018 FINDINGS: Cardiovascular: Contrast injection is sufficient to demonstrate satisfactory opacification of the pulmonary arteries to the  segmental level. There is no pulmonary embolus. The main pulmonary artery is within normal limits for size. There is a normal 3-vessel arch branching pattern without evidence of acute aortic syndrome. There is noaortic atherosclerosis. Heart size is normal, without pericardial effusion. Mediastinum/Nodes: No mediastinal, hilar or axillary lymphadenopathy. The visualized thyroid and thoracic esophageal course are unremarkable. Lungs/Pleura: No pulmonary nodules or masses. No pleural effusion or pneumothorax. No focal airspace consolidation. No focal pleural abnormality. Upper  Abdomen: Contrast bolus timing is not optimized for evaluation of the abdominal organs. Hepatomegaly with hepatic steatosis. Musculoskeletal: No chest wall abnormality. No acute or significant osseous findings. Review of the MIP images confirms the above findings. IMPRESSION: 1. No pulmonary embolus or acute aortic syndrome. 2. Clear lungs. 3. Hepatomegaly and attic steatosis. Electronically Signed   By: Ulyses Jarred M.D.   On: 03/29/2018 17:19    EKG: Orders placed or performed during the hospital encounter of 03/29/18  . EKG 12-Lead  . EKG 12-Lead  . ED EKG within 10 minutes  . ED EKG within 10 minutes    IMPRESSION AND PLAN:  * anginal chest pain   Will monitor on telemetry, follow serial troponin.   As she is high risk for coronary artery disease due to hypertension, hyperlipidemia, diabetes, extensive smoking history and family history of coronary artery disease- I will orders stress test for tomorrow morning in cardiology consult.   Check lipid panel and hemoglobin A1c.  * diabetes   Continue her home dose of Lantus and slightly decreased dose of NovoLog and keep on sliding scale coverage.   Will hold her oral medications for now.  * hypertension   Continue home medications.  * hyperlipidemia   Continue her statin for now.  * elevated d-dimer level   CT scan angiogram of chest was done which is negative for PE.  All the records are reviewed and case discussed with ED provider. Management plans discussed with the patient, family and they are in agreement.  CODE STATUS: Full. Code Status History    Date Active Date Inactive Code Status Order ID Comments User Context   06/09/2017 0940 06/15/2017 1500 Full Code [id number]  Epifanio Lesches, MD ED   12/14/2016 1415 12/14/2016 2008 Full Code [id number]  Poggi, Marshall Cork, MD Inpatient       TOTAL TIME TAKING CARE OF THIS PATIENT: 45 minutes.    Vaughan Basta M.D on 03/29/2018   Between 7am to 6pm - Pager -  [phone]  After 6pm go to [email or web address] - password [email or web address] [facility]  Office  [phone]  CC: Primary care physician; Lorelee Market, MD   Note: This dictation was prepared with Dragon dictation along with smaller phrase technology. Any transcriptional errors that result from this process are unintentional.

## 2018-03-29 NOTE — ED Notes (Signed)
Charge RN Greg at bedside attempting IV ultrasound. RN Caryl Pina aware that patient doesn't have IV at this time.

## 2018-03-30 ENCOUNTER — Observation Stay: Payer: Medicaid Other

## 2018-03-30 ENCOUNTER — Other Ambulatory Visit: Payer: Medicaid Other

## 2018-03-30 ENCOUNTER — Observation Stay (HOSPITAL_BASED_OUTPATIENT_CLINIC_OR_DEPARTMENT_OTHER): Payer: Medicaid Other

## 2018-03-30 ENCOUNTER — Encounter: Payer: Self-pay | Admitting: Radiology

## 2018-03-30 DIAGNOSIS — R29703 NIHSS score 3: Secondary | ICD-10-CM | POA: Diagnosis not present

## 2018-03-30 DIAGNOSIS — Z96651 Presence of right artificial knee joint: Secondary | ICD-10-CM | POA: Diagnosis present

## 2018-03-30 DIAGNOSIS — E1165 Type 2 diabetes mellitus with hyperglycemia: Secondary | ICD-10-CM | POA: Diagnosis present

## 2018-03-30 DIAGNOSIS — Z8673 Personal history of transient ischemic attack (TIA), and cerebral infarction without residual deficits: Secondary | ICD-10-CM | POA: Diagnosis present

## 2018-03-30 DIAGNOSIS — J45909 Unspecified asthma, uncomplicated: Secondary | ICD-10-CM | POA: Diagnosis present

## 2018-03-30 DIAGNOSIS — M1990 Unspecified osteoarthritis, unspecified site: Secondary | ICD-10-CM | POA: Diagnosis present

## 2018-03-30 DIAGNOSIS — R079 Chest pain, unspecified: Secondary | ICD-10-CM | POA: Diagnosis present

## 2018-03-30 DIAGNOSIS — R7989 Other specified abnormal findings of blood chemistry: Secondary | ICD-10-CM | POA: Diagnosis present

## 2018-03-30 DIAGNOSIS — I639 Cerebral infarction, unspecified: Secondary | ICD-10-CM

## 2018-03-30 DIAGNOSIS — G4733 Obstructive sleep apnea (adult) (pediatric): Secondary | ICD-10-CM | POA: Diagnosis present

## 2018-03-30 DIAGNOSIS — N289 Disorder of kidney and ureter, unspecified: Secondary | ICD-10-CM | POA: Diagnosis present

## 2018-03-30 DIAGNOSIS — Z8589 Personal history of malignant neoplasm of other organs and systems: Secondary | ICD-10-CM | POA: Diagnosis not present

## 2018-03-30 DIAGNOSIS — E782 Mixed hyperlipidemia: Secondary | ICD-10-CM | POA: Diagnosis present

## 2018-03-30 DIAGNOSIS — Z9851 Tubal ligation status: Secondary | ICD-10-CM | POA: Diagnosis not present

## 2018-03-30 DIAGNOSIS — Z8249 Family history of ischemic heart disease and other diseases of the circulatory system: Secondary | ICD-10-CM | POA: Diagnosis not present

## 2018-03-30 DIAGNOSIS — Z6841 Body Mass Index (BMI) 40.0 and over, adult: Secondary | ICD-10-CM | POA: Diagnosis not present

## 2018-03-30 DIAGNOSIS — K760 Fatty (change of) liver, not elsewhere classified: Secondary | ICD-10-CM | POA: Diagnosis present

## 2018-03-30 DIAGNOSIS — G459 Transient cerebral ischemic attack, unspecified: Secondary | ICD-10-CM

## 2018-03-30 DIAGNOSIS — I208 Other forms of angina pectoris: Secondary | ICD-10-CM | POA: Diagnosis present

## 2018-03-30 DIAGNOSIS — I10 Essential (primary) hypertension: Secondary | ICD-10-CM | POA: Diagnosis present

## 2018-03-30 DIAGNOSIS — Z87891 Personal history of nicotine dependence: Secondary | ICD-10-CM | POA: Diagnosis not present

## 2018-03-30 DIAGNOSIS — Z794 Long term (current) use of insulin: Secondary | ICD-10-CM | POA: Diagnosis not present

## 2018-03-30 DIAGNOSIS — Z7982 Long term (current) use of aspirin: Secondary | ICD-10-CM | POA: Diagnosis not present

## 2018-03-30 LAB — CBC
HCT: 39.4 % (ref 35.0–47.0)
Hemoglobin: 13.2 g/dL (ref 12.0–16.0)
MCH: 31.1 pg (ref 26.0–34.0)
MCHC: 33.4 g/dL (ref 32.0–36.0)
MCV: 93 fL (ref 80.0–100.0)
PLATELETS: 234 10*3/uL (ref 150–440)
RBC: 4.23 MIL/uL (ref 3.80–5.20)
RDW: 14.1 % (ref 11.5–14.5)
WBC: 6.3 10*3/uL (ref 3.6–11.0)

## 2018-03-30 LAB — GLUCOSE, CAPILLARY
GLUCOSE-CAPILLARY: 91 mg/dL (ref 65–99)
Glucose-Capillary: 153 mg/dL — ABNORMAL HIGH (ref 65–99)
Glucose-Capillary: 225 mg/dL — ABNORMAL HIGH (ref 65–99)
Glucose-Capillary: 195 mg/dL — ABNORMAL HIGH (ref 65–99)
Glucose-Capillary: 155 mg/dL — ABNORMAL HIGH (ref 65–99)

## 2018-03-30 LAB — BASIC METABOLIC PANEL
Anion gap: 6 (ref 5–15)
BUN: 16 mg/dL (ref 6–20)
CHLORIDE: 99 mmol/L — AB (ref 101–111)
CO2: 32 mmol/L (ref 22–32)
CREATININE: 0.86 mg/dL (ref 0.44–1.00)
Calcium: 8.5 mg/dL — ABNORMAL LOW (ref 8.9–10.3)
GFR calc Af Amer: >60 mL/min (ref >60)
GFR calc non Af Amer: >60 mL/min (ref >60)
GLUCOSE: 195 mg/dL — AB (ref 65–99)
POTASSIUM: 3.5 mmol/L (ref 3.5–5.1)
SODIUM: 137 mmol/L (ref 135–145)

## 2018-03-30 LAB — NM MYOCAR MULTI W/SPECT W/WALL MOTION / EF
CHL CUP RESTING HR STRESS: 74 {beats}/min
CSEPHR: 65 %
CSEPPHR: 112 {beats}/min
LV dias vol: 87 mL (ref 46–106)
LV sys vol: 27 mL
SSS: 6

## 2018-03-30 LAB — MRSA PCR SCREENING: MRSA by PCR: NEGATIVE

## 2018-03-30 LAB — TROPONIN I: Troponin I: <0.03 ng/mL (ref <0.03)

## 2018-03-30 MED ORDER — IOPAMIDOL (ISOVUE-370) INJECTION 76%
75.0000 mL | Freq: Once | INTRAVENOUS | Status: AC | PRN
  Administered 2018-03-30: 75 mL via INTRAVENOUS

## 2018-03-30 MED ORDER — ACETAMINOPHEN 325 MG PO TABS
650.0000 mg | ORAL_TABLET | Freq: Four times a day (QID) | ORAL | Status: DC | PRN
  Administered 2018-03-30 – 2018-03-31 (×2): 650 mg via ORAL
  Filled 2018-03-30 (×2): qty 2

## 2018-03-30 MED ORDER — TECHNETIUM TC 99M TETROFOSMIN IV KIT
30.0000 | PACK | Freq: Once | INTRAVENOUS | Status: AC | PRN
  Administered 2018-03-30: 30.312 via INTRAVENOUS

## 2018-03-30 MED ORDER — ASPIRIN 300 MG RE SUPP: 300.0000 mg | Freq: Once | RECTAL | Status: DC

## 2018-03-30 MED ORDER — ASPIRIN 325 MG PO TABS
325.0000 mg | ORAL_TABLET | Freq: Once | ORAL | Status: AC
  Administered 2018-03-30: 325 mg via ORAL
  Filled 2018-03-30: qty 1

## 2018-03-30 MED ORDER — LIVING WELL WITH DIABETES BOOK
Freq: Once | Status: DC
  Filled 2018-03-30 (×2): qty 1

## 2018-03-30 MED ORDER — REGADENOSON 0.4 MG/5ML IV SOLN
0.4000 mg | Freq: Once | INTRAVENOUS | Status: AC
  Administered 2018-03-30: 0.4 mg via INTRAVENOUS

## 2018-03-30 MED ORDER — SODIUM CHLORIDE 0.9 % IV SOLN
INTRAVENOUS | Status: DC
  Administered 2018-03-30: 100 mL/h via INTRAVENOUS

## 2018-03-30 NOTE — Progress Notes (Signed)
Inpatient Diabetes Program Recommendations  AACE/ADA: New Consensus Statement on Inpatient Glycemic Control (2015)  Target Ranges:  Prepandial:   less than 140 mg/dL      Peak postprandial:   less than 180 mg/dL (1-2 hours)      Critically ill patients:  140 - 180 mg/dL   Lab Results  Component Value Date   GLUCAP 91 03/30/2018   HGBA1C 11.8 (H) 03/29/2018    Review of Glycemic Control  Diabetes history: DM2 Outpatient Diabetes medications: Lantus 100 units qd + Humalog 40 units tid Current orders for Inpatient glycemic control: Lantus 100 units qd + Humalog 10 units meal coverage tid + Novolog sensitive tid  Inpatient Diabetes Program Recommendations:   Spoke with pt about A1C 11.8  (average blood glucose 292 over the past 2-3 months) results with them and explained what an A1C is, basic pathophysiology of DM Type 2, basic home care, basic diabetes diet nutrition principles, importance of checking CBGs and maintaining good CBG control to prevent long-term and short-term complications. Reviewed signs and symptoms of hyperglycemia and hypoglycemia and how to treat hypoglycemia at home. Also reviewed blood sugar goals at home.  RNs to provide ongoing basic DM education at bedside with this patient. Have ordered educational booklet   Patient shared that she plans to decrease amount of carbohydrates and stop drinking sweet tea and juices. Patient acknowledges need to decrease amount of carbohydrates she is taking in.  Thank you, Nani Gasser. Rory Montel, RN, MSN, CDE  Diabetes Coordinator Inpatient Glycemic Control Team Team Pager [phone] (8am-5pm) 03/30/2018 2:13 PM

## 2018-03-30 NOTE — Progress Notes (Signed)
Pt notified this RN that she was feeling some left sided arm and leg weakness, that she "dropped the phone while talking on it", pt alert and oriented, VSS,  MD notified, Code Stroke initiated, pt and belongings sent to ICU after CT angio of head and neck.

## 2018-03-30 NOTE — Consult Note (Addendum)
Referring Physician: Gouru    Chief Complaint: Left arm weakness and numbness  HPI: Brenda Hicks is an 52 y.o. female admitted for chest pain who today while in bed noted the acute onset of LUE numbness and weakness.  While calling the nursing station dropped the phone out of the left hand due to weakness.  Code stroke called at that time.  Initial NIHSS of 3.    Date last known well: Date: 03/30/2018 Time last known well: Time: 14:40 tPA Given: No: Minimal symptoms improving  Past Medical History:  Diagnosis Date  . Arthritis   . Asthma    WELL CONTROLLED  . Cancer of ear   . Diabetes mellitus without complication ([place])   . Fatty liver   . Hypertension   . Kidney cysts   . Renal disorder   . Sleep apnea    USES CPAP    Past Surgical History:  Procedure Laterality Date  . DILATION AND CURETTAGE OF UTERUS    . ENDOMETRIAL BIOPSY    . EXPLORATORY LAPAROTOMY     REMOVAL OF RUPTURED ECTOPIC  . HAND SURGERY    . HERNIA REPAIR     UMBILICAL  . JOINT REPLACEMENT Right    TKR  . KNEE ARTHROSCOPY    . KNEE SURGERY Right   . SHOULDER ARTHROSCOPY WITH BICEPSTENOTOMY Left 12/14/2016   Procedure: SHOULDER ARTHROSCOPY WITH BICEPSTENOTOMY;  Surgeon: Corky Mull, MD;  Location: [facility] OR[facility];  Service: Orthopedics;  Laterality: Left;  . SHOULDER ARTHROSCOPY WITH OPEN ROTATOR CUFF REPAIR Left 12/14/2016   Procedure: SHOULDER ARTHROSCOPY WITH OPEN ROTATOR CUFF REPAIR AND ARTHROSCOPIC ROTATOR CUFF REPAIR;  Surgeon: Corky Mull, MD;  Location: [facility] OR[facility];  Service: Orthopedics;  Laterality: Left;  . SHOULDER ARTHROSCOPY WITH SUBACROMIAL DECOMPRESSION Left 12/14/2016   Procedure: SHOULDER ARTHROSCOPY WITH SUBACROMIAL DECOMPRESSION;  Surgeon: Corky Mull, MD;  Location: [facility];  Service: Orthopedics;  Laterality: Left;  . TUBAL LIGATION      Family History  Problem Relation Age of Onset  . CAD Maternal Grandmother    Social History:  reports that she has quit smoking. Her smoking use  included cigarettes. She has a 50.00 pack-year smoking history. She has never used smokeless tobacco. She reports that she does not drink alcohol or use drugs.  Allergies: No Known Allergies  Medications:  I have reviewed the patient's current medications. Prior to Admission:  Medications Prior to Admission  Medication Sig Dispense Refill Last Dose  . albuterol (PROVENTIL HFA;VENTOLIN HFA) 108 (90 Base) MCG/ACT inhaler Inhale 2 puffs into the lungs every 6 (six) hours as needed for wheezing or shortness of breath.   prn at prn  . aspirin EC 81 MG tablet Take 81 mg by mouth daily.    01/30/2018 at Unknown time  . atorvastatin (LIPITOR) 40 MG tablet Take 1 tablet (40 mg total) by mouth daily. 15 tablet 11 01/30/2018 at Unknown time  . FLOVENT HFA 220 MCG/ACT inhaler Inhale 2 puffs into the lungs 2 (two) times daily.  5 prn at prn  . furosemide (LASIX) 40 MG tablet Take 1 tablet by mouth daily.  5   . gabapentin (NEURONTIN) 300 MG capsule Take 300 mg by mouth 3 (three) times daily.    01/30/2018 at Unknown time  . insulin glargine (LANTUS) 100 UNIT/ML injection Inject 1 mL (100 Units total) into the skin at bedtime. 10 mL 5 01/29/2018 at Unknown time  . insulin lispro (HUMALOG) 100 UNIT/ML injection Inject 0.2 mLs (  20 Units total) into the skin 3 (three) times daily with meals. 10 mL 5 01/29/2018 at Unknown time  . lisinopril (PRINIVIL,ZESTRIL) 40 MG tablet Take 40 mg by mouth every evening.    01/30/2018 at Unknown time  . metFORMIN (GLUCOPHAGE) 1000 MG tablet Take 1 tablet by mouth 2 (two) times daily.  5 01/30/2018 at Unknown time  . potassium chloride (K-DUR) 10 MEQ tablet Take 1 tablet by mouth daily.  5   . simvastatin (ZOCOR) 20 MG tablet Take 1 tablet by mouth daily.  5   . sitaGLIPtin (JANUVIA) 100 MG tablet Take 100 mg by mouth daily. Patient reports taking Januvia 100 mg daily   01/30/2018 at Unknown time  . STIOLTO RESPIMAT 2.5-2.5 MCG/ACT AERS Take 2 puffs by mouth daily.  5 prn at prn  .  traZODone (DESYREL) 50 MG tablet Take 1 tablet by mouth at bedtime.  5 01/29/2018 at Unknown time  . doxycycline (VIBRA-TABS) 100 MG tablet Take 1 tablet (100 mg total) by mouth every 12 (twelve) hours. (Patient not taking: Reported on 01/30/2018) 14 tablet 0 Not Taking at Unknown time  . Elastic Bandages & Supports (MEDICAL COMPRESSION STOCKINGS) MISC 2 medium stockings Use as directed on both lower legs 2 each 0   . HYDROcodone-acetaminophen (NORCO/VICODIN) 5-325 MG tablet Take 1 tablet by mouth every 8 (eight) hours as needed for moderate pain. (Patient not taking: Reported on 03/29/2018) 12 tablet 0 Not Taking at Unknown time  . meloxicam (MOBIC) 7.5 MG tablet Take 1 tablet by mouth 2 (two) times daily.  5 01/30/2018 at Unknown time  . orphenadrine (NORFLEX) 100 MG tablet Take 1 tablet (100 mg total) by mouth 2 (two) times daily as needed for muscle spasms. 10 tablet 0 01/30/2018 at Unknown time  . tiZANidine (ZANAFLEX) 4 MG tablet Take 1 tablet by mouth every 8 (eight) hours as needed for muscle spasms.   5 prn at prn  . zolpidem (AMBIEN) 5 MG tablet Take 1 tablet (5 mg total) by mouth at bedtime as needed for sleep. (Patient not taking: Reported on 01/30/2018) 30 tablet 0 Not Taking at Unknown time   Scheduled: . aspirin EC  81 mg Oral Daily  . aspirin  300 mg Rectal Once  . atorvastatin  40 mg Oral Daily  . furosemide  40 mg Oral Daily  . gabapentin  300 mg Oral TID  . heparin  5,000 Units Subcutaneous Q8H  . insulin aspart  0-9 Units Subcutaneous TID WC  . insulin aspart  10 Units Subcutaneous TID WC  . insulin glargine  100 Units Subcutaneous QHS  . lisinopril  40 mg Oral QPM  . living well with diabetes book   Does not apply Once  . meloxicam  7.5 mg Oral BID  . metoprolol tartrate  12.5 mg Oral BID  . potassium chloride  10 mEq Oral Daily  . simvastatin  20 mg Oral Daily  . sodium chloride flush  3 mL Intravenous Q12H  . traZODone  50 mg Oral QHS    ROS: History obtained from the  patient  General ROS: negative for - chills, fatigue, fever, night sweats, weight gain or weight loss Psychological ROS: negative for - behavioral disorder, hallucinations, memory difficulties, mood swings or suicidal ideation Ophthalmic ROS: blurry vision ENT ROS: negative for - epistaxis, nasal discharge, oral lesions, sore throat, tinnitus or vertigo Allergy and Immunology ROS: negative for - hives or itchy/watery eyes Hematological and Lymphatic ROS: negative for - bleeding problems,  bruising or swollen lymph nodes Endocrine ROS: negative for - galactorrhea, hair pattern changes, polydipsia/polyuria or temperature intolerance Respiratory ROS: negative for - cough, hemoptysis, shortness of breath or wheezing Cardiovascular ROS: negative for - chest pain, dyspnea on exertion, edema or irregular heartbeat Gastrointestinal ROS: negative for - abdominal pain, diarrhea, hematemesis, nausea/vomiting or stool incontinence Genito-Urinary ROS: negative for - dysuria, hematuria, incontinence or urinary frequency/urgency Musculoskeletal ROS: left shoulder pain Neurological ROS: as noted in HPI, baseline leg weakness and RLE numbness Dermatological ROS: negative for rash and skin lesion changes  Physical Examination: Blood pressure (!) 177/91, pulse 80, temperature 98.5 F (36.9 C), temperature source Oral, resp. rate 16, height 5\' 6"  (1.676 m), weight (!) 152.1 kg (335 lb 6.4 oz), SpO2 97 %.  HEENT-  Normocephalic, no lesions, without obvious abnormality.  Normal external eye and conjunctiva.  Normal TM's bilaterally.  Normal auditory canals and external ears. Normal external nose, mucus membranes and septum.  Normal pharynx. Cardiovascular- S1, S2 normal, pulses palpable throughout   Lungs- chest clear, no wheezing, rales, normal symmetric air entry Abdomen- soft, non-tender; bowel sounds normal; no masses,  no organomegaly Extremities- no edema Lymph-no adenopathy palpable Musculoskeletal-no  joint tenderness, deformity or swelling Skin-warm and dry, no hyperpigmentation, vitiligo, or suspicious lesions  Neurological Examination   Mental Status: Alert, oriented, thought content appropriate.  Speech fluent without evidence of aphasia.  Able to follow 3 step commands without difficulty. Cranial Nerves: II: Discs flat bilaterally; Visual fields grossly normal, pupils equal, round, reactive to light and accommodation III,IV, VI: ptosis not present, extra-ocular motions intact bilaterally V,VII: decreased right NLF, facial light touch sensation normal bilaterally VIII: hearing normal bilaterally IX,X: gag reflex present XI: bilateral shoulder shrug XII: midline tongue extension Motor: Right : Upper extremity   5/5    Left:     Upper extremity   5/5 with decreased ROM due to pain  Lower extremity   5-/5     Lower extremity   5-/5 Tone and bulk:normal tone throughout; no atrophy noted Sensory: Pinprick and light touch decreased in the RLE Deep Tendon Reflexes: 2+ in the upper extremities and absent in the lower extremities Plantars: Right: mute   Left: mute Cerebellar: Normal finger-to-nose and normal heel-to-shin testing bilaterally Gait: not tested due to safety concerns    Laboratory Studies:  Basic Metabolic Panel: Recent Labs  Lab 03/29/18 0945 03/30/18 0222  NA 137 137  K 3.8 3.5  CL 99* 99*  CO2 30 32  GLUCOSE 229* 195*  BUN 17 16  CREATININE 0.94 0.86  CALCIUM 8.6* 8.5*    Liver Function Tests: No results for input(s): AST, ALT, ALKPHOS, BILITOT, PROT, ALBUMIN in the last 168 hours. No results for input(s): LIPASE, AMYLASE in the last 168 hours. No results for input(s): AMMONIA in the last 168 hours.  CBC: Recent Labs  Lab 03/29/18 0945 03/30/18 0222  WBC 5.5 6.3  HGB 12.9 13.2  HCT 38.6 39.4  MCV 93.2 93.0  PLT 229 234    Cardiac Enzymes: Recent Labs  Lab 03/29/18 0945 03/29/18 1347 03/29/18 1836 03/29/18 2112 03/30/18 0222  TROPONINI  <0.03 <0.03 <0.03 <0.03 <0.03    BNP: Invalid input(s): POCBNP  CBG: Recent Labs  Lab 03/29/18 2309 03/30/18 0851 03/30/18 1240 03/30/18 1508  GLUCAP 175* 153* 91 225*    Microbiology: Results for orders placed or performed during the hospital encounter of 03/29/18  MRSA PCR Screening     Status: None   Collection Time: 03/30/18  1:48 PM  Result Value Ref Range Status   MRSA by PCR NEGATIVE NEGATIVE Final    Comment:        The GeneXpert MRSA Assay (FDA approved for NASAL specimens only), is one component of a comprehensive MRSA colonization surveillance program. It is not intended to diagnose MRSA infection nor to guide or monitor treatment for MRSA infections. Performed at [facility], [address] [place], [place] [address]     Coagulation Studies: No results for input(s): LABPROT, INR in the last 72 hours.  Urinalysis: No results for input(s): COLORURINE, LABSPEC, PHURINE, GLUCOSEU, HGBUR, BILIRUBINUR, KETONESUR, PROTEINUR, UROBILINOGEN, NITRITE, LEUKOCYTESUR in the last 168 hours.  Invalid input(s): APPERANCEUR  Lipid Panel:    Component Value Date/Time   CHOL 174 03/29/2018 1836   TRIG 112 03/29/2018 1836   HDL 36 (L) 03/29/2018 1836   CHOLHDL 4.8 03/29/2018 1836   VLDL 22 03/29/2018 1836   LDLCALC 116 (H) 03/29/2018 1836    HgbA1C:  Lab Results  Component Value Date   HGBA1C 11.8 (H) 03/29/2018    Urine Drug Screen:  No results found for: LABOPIA, COCAINSCRNUR, LABBENZ, AMPHETMU, THCU, LABBARB  Alcohol Level: No results for input(s): ETH in the last 168 hours.  Other results: EKG: sinus rhythm at 82 bpm.  Imaging: Dg Chest 2 View  Result Date: 03/29/2018 CLINICAL DATA:  Chest pain. EXAM: CHEST - 2 VIEW COMPARISON:  Radiographs of January 30, 2018. FINDINGS: The heart size and mediastinal contours are within normal limits. Both lungs are clear. No pneumothorax or pleural effusion is noted. The visualized skeletal structures are  unremarkable. IMPRESSION: No active cardiopulmonary disease. Electronically Signed   By: Marijo Conception, M.D.   On: 03/29/2018 10:18   Ct Angio Chest Pe W And/or Wo Contrast  Result Date: 03/29/2018 CLINICAL DATA:  Chest pain and shortness of breath EXAM: CT ANGIOGRAPHY CHEST WITH CONTRAST TECHNIQUE: Multidetector CT imaging of the chest was performed using the standard protocol during bolus administration of intravenous contrast. Multiplanar CT image reconstructions and MIPs were obtained to evaluate the vascular anatomy. CONTRAST:  32mL ISOVUE-370 IOPAMIDOL (ISOVUE-370) INJECTION 76% COMPARISON:  Chest radiograph 03/29/2018 FINDINGS: Cardiovascular: Contrast injection is sufficient to demonstrate satisfactory opacification of the pulmonary arteries to the segmental level. There is no pulmonary embolus. The main pulmonary artery is within normal limits for size. There is a normal 3-vessel arch branching pattern without evidence of acute aortic syndrome. There is noaortic atherosclerosis. Heart size is normal, without pericardial effusion. Mediastinum/Nodes: No mediastinal, hilar or axillary lymphadenopathy. The visualized thyroid and thoracic esophageal course are unremarkable. Lungs/Pleura: No pulmonary nodules or masses. No pleural effusion or pneumothorax. No focal airspace consolidation. No focal pleural abnormality. Upper Abdomen: Contrast bolus timing is not optimized for evaluation of the abdominal organs. Hepatomegaly with hepatic steatosis. Musculoskeletal: No chest wall abnormality. No acute or significant osseous findings. Review of the MIP images confirms the above findings. IMPRESSION: 1. No pulmonary embolus or acute aortic syndrome. 2. Clear lungs. 3. Hepatomegaly and attic steatosis. Electronically Signed   By: Ulyses Jarred M.D.   On: 03/29/2018 17:19   US Venous Img Lower Bilateral  Result Date: 03/30/2018 CLINICAL DATA:  Bilateral lower extremity pain and edema. Elevated D-dimer. Evaluate  for DVT. EXAM: BILATERAL LOWER EXTREMITY VENOUS DOPPLER ULTRASOUND TECHNIQUE: Gray-scale sonography with graded compression, as well as color Doppler and duplex ultrasound were performed to evaluate the lower extremity deep venous systems from the level of the common femoral vein and  including the common femoral, femoral, profunda femoral, popliteal and calf veins including the posterior tibial, peroneal and gastrocnemius veins when visible. The superficial great saphenous vein was also interrogated. Spectral Doppler was utilized to evaluate flow at rest and with distal augmentation maneuvers in the common femoral, femoral and popliteal veins. COMPARISON:  Bilateral lower extremity venous Doppler ultrasound - 01/30/2018 FINDINGS: RIGHT LOWER EXTREMITY Common Femoral Vein: No evidence of thrombus. Normal compressibility, respiratory phasicity and response to augmentation. Saphenofemoral Junction: No evidence of thrombus. Normal compressibility and flow on color Doppler imaging. Profunda Femoral Vein: No evidence of thrombus. Normal compressibility and flow on color Doppler imaging. Femoral Vein: No evidence of thrombus. Normal compressibility, respiratory phasicity and response to augmentation. Popliteal Vein: No evidence of thrombus. Normal compressibility, respiratory phasicity and response to augmentation. Calf Veins: No evidence of thrombus. Normal compressibility and flow on color Doppler imaging. Superficial Great Saphenous Vein: No evidence of thrombus. Normal compressibility. Venous Reflux:  None. Other Findings:  None. LEFT LOWER EXTREMITY Common Femoral Vein: No evidence of thrombus. Normal compressibility, respiratory phasicity and response to augmentation. Saphenofemoral Junction: No evidence of thrombus. Normal compressibility and flow on color Doppler imaging. Profunda Femoral Vein: No evidence of thrombus. Normal compressibility and flow on color Doppler imaging. Femoral Vein: No evidence of thrombus.  Normal compressibility, respiratory phasicity and response to augmentation. Popliteal Vein: No evidence of thrombus. Normal compressibility, respiratory phasicity and response to augmentation. Calf Veins: No evidence of thrombus. Normal compressibility and flow on color Doppler imaging. Superficial Great Saphenous Vein: No evidence of thrombus. Normal compressibility. Venous Reflux:  None. Other Findings:  None. IMPRESSION: No evidence of DVT within either lower extremity Electronically Signed   By: Sandi Mariscal M.D.   On: 03/30/2018 09:59    Assessment: 52 y.o. female with poorly controlled HTN and DM who reported acute onset of left arm numbness and weakness today.  Symptoms improved but continues to complain of LUE numbness.  Head CT and CTA of the head and neck reviewed and show no evidence of acute changes or LVO.  tPA not indicated.  Echocardiogram pending.  A1c 11.8, LDL 116.  Stroke Risk Factors - diabetes mellitus and hypertension  Plan: 1. MRI of the brain without contrast 2. PT consult, OT consult, Speech consult 3. Prophylactic therapy-Continue ASA 4. NPO until RN stroke swallow screen 5. Telemetry monitoring 6. Frequent neuro checks 7. Transfer to ICU 8.  BP low today which is not characteristic for the patient.  Would liberalize BP.  NS bolus given and HOB lowered. 9. BS control with target A1c<7.0.  Medication compliance stressed. 10. Aggressive lipid management with target LDL<70.   11. Tylenol for headache  Case discussed with Dr. Leone Brand, MD Neurology [phone] 03/30/2018, 3:27 PM

## 2018-03-30 NOTE — Code Documentation (Signed)
Pt on inpatient unit, admitted for chest pain and elevated d-dimer, at 1449 pt called RN into room and stated her left side was numb and she dropped the phone, code stroke activated, upon arrival to room Dr. Doy Mince at bedside, CBG and vitals obtained, pt taken to Ct for CT/CTA, pt then taken to ICU room 3, NIHSS 3 preformed by Dr. Doy Mince, no tPA due to SQ heparin given at 1347 and symptoms improving, pt states left sided weakness is improving, report off to [facility]

## 2018-03-30 NOTE — Progress Notes (Signed)
[facility] [place] NAME: Brenda Hicks    MR#:  [id number]  DATE OF BIRTH:  1965/12/17  SUBJECTIVE:  CHIEF COMPLAINT: Patient denies any chest pain but is reporting left upper extremity and lower extremity weakness associated with headache which is all new.  Code stroke activated, discussed with Dr. Doy Mince stat CT ordered.  And patient is transferred to ICU, patient is not a TPA candidate as per my discussion with neurologist Dr. Doy Mince  REVIEW OF SYSTEMS:  CONSTITUTIONAL: No fever, fatigue or weakness.  EYES: No blurred or double vision.  EARS, NOSE, AND THROAT: No tinnitus or ear pain.  RESPIRATORY: No cough, shortness of breath, wheezing or hemoptysis.  CARDIOVASCULAR: No chest pain, orthopnea, edema.  GASTROINTESTINAL: No nausea, vomiting, diarrhea or abdominal pain.  GENITOURINARY: No dysuria, hematuria.  ENDOCRINE: No polyuria, nocturia,  HEMATOLOGY: No anemia, easy bruising or bleeding SKIN: No rash or lesion. MUSCULOSKELETAL: Chronic left shoulder pain is present  nEUROLOGIC: Reporting left-sided tingling and numbness in the left upper and lower extremities PSYCHIATRY: No anxiety or depression.   DRUG ALLERGIES:  No Known Allergies  VITALS:  Blood pressure 134/78, pulse 86, temperature 98.5 F (36.9 C), temperature source Oral, resp. rate 16, height 5\' 6"  (1.676 m), weight (!) 152.1 kg (335 lb 6.4 oz), SpO2 98 %.  PHYSICAL EXAMINATION:  GENERAL:  52 y.o.-year-old patient lying in the bed with no acute distress.  EYES: Pupils equal, round, reactive to light and accommodation. No scleral icterus. Extraocular muscles intact.  HEENT: Head atraumatic, normocephalic. Oropharynx and nasopharynx clear.  NECK:  Supple, no jugular venous distention. No thyroid enlargement, no tenderness.  LUNGS: Normal breath sounds bilaterally, no wheezing, rales,rhonchi or crepitation. No use of accessory muscles of respiration.   CARDIOVASCULAR: S1, S2 normal. No murmurs, rubs, or gallops.  ABDOMEN: Soft, nontender, nondistended. Bowel sounds present. No organomegaly or mass.  EXTREMITIES: No pedal edema, cyanosis, or clubbing.  NEUROLOGIC: Cranial nerves II through XII are intact. Muscle strength 4/5 in  left upper and lower extremity and normal in right upper and lower extremities. Sensation intact. Gait not checked.  PSYCHIATRIC: The patient is alert and oriented x 3.  SKIN: No obvious rash, lesion, or ulcer.    LABORATORY PANEL:   CBC Recent Labs  Lab 03/30/18 0222  WBC 6.3  HGB 13.2  HCT 39.4  PLT 234   ------------------------------------------------------------------------------------------------------------------  Chemistries  Recent Labs  Lab 03/30/18 0222  NA 137  K 3.5  CL 99*  CO2 32  GLUCOSE 195*  BUN 16  CREATININE 0.86  CALCIUM 8.5*   ------------------------------------------------------------------------------------------------------------------  Cardiac Enzymes Recent Labs  Lab 03/30/18 0222  TROPONINI <0.03   ------------------------------------------------------------------------------------------------------------------  RADIOLOGY:  Dg Chest 2 View  Result Date: 03/29/2018 CLINICAL DATA:  Chest pain. EXAM: CHEST - 2 VIEW COMPARISON:  Radiographs of January 30, 2018. FINDINGS: The heart size and mediastinal contours are within normal limits. Both lungs are clear. No pneumothorax or pleural effusion is noted. The visualized skeletal structures are unremarkable. IMPRESSION: No active cardiopulmonary disease. Electronically Signed   By: Marijo Conception, M.D.   On: 03/29/2018 10:18   Ct Angio Chest Pe W And/or Wo Contrast  Result Date: 03/29/2018 CLINICAL DATA:  Chest pain and shortness of breath EXAM: CT ANGIOGRAPHY CHEST WITH CONTRAST TECHNIQUE: Multidetector CT imaging of the chest was performed using the standard protocol during bolus administration of intravenous contrast.  Multiplanar CT image reconstructions and MIPs were obtained to  evaluate the vascular anatomy. CONTRAST:  32mL ISOVUE-370 IOPAMIDOL (ISOVUE-370) INJECTION 76% COMPARISON:  Chest radiograph 03/29/2018 FINDINGS: Cardiovascular: Contrast injection is sufficient to demonstrate satisfactory opacification of the pulmonary arteries to the segmental level. There is no pulmonary embolus. The main pulmonary artery is within normal limits for size. There is a normal 3-vessel arch branching pattern without evidence of acute aortic syndrome. There is noaortic atherosclerosis. Heart size is normal, without pericardial effusion. Mediastinum/Nodes: No mediastinal, hilar or axillary lymphadenopathy. The visualized thyroid and thoracic esophageal course are unremarkable. Lungs/Pleura: No pulmonary nodules or masses. No pleural effusion or pneumothorax. No focal airspace consolidation. No focal pleural abnormality. Upper Abdomen: Contrast bolus timing is not optimized for evaluation of the abdominal organs. Hepatomegaly with hepatic steatosis. Musculoskeletal: No chest wall abnormality. No acute or significant osseous findings. Review of the MIP images confirms the above findings. IMPRESSION: 1. No pulmonary embolus or acute aortic syndrome. 2. Clear lungs. 3. Hepatomegaly and attic steatosis. Electronically Signed   By: Ulyses Jarred M.D.   On: 03/29/2018 17:19   US Venous Img Lower Bilateral  Result Date: 03/30/2018 CLINICAL DATA:  Bilateral lower extremity pain and edema. Elevated D-dimer. Evaluate for DVT. EXAM: BILATERAL LOWER EXTREMITY VENOUS DOPPLER ULTRASOUND TECHNIQUE: Gray-scale sonography with graded compression, as well as color Doppler and duplex ultrasound were performed to evaluate the lower extremity deep venous systems from the level of the common femoral vein and including the common femoral, femoral, profunda femoral, popliteal and calf veins including the posterior tibial, peroneal and gastrocnemius veins when  visible. The superficial great saphenous vein was also interrogated. Spectral Doppler was utilized to evaluate flow at rest and with distal augmentation maneuvers in the common femoral, femoral and popliteal veins. COMPARISON:  Bilateral lower extremity venous Doppler ultrasound - 01/30/2018 FINDINGS: RIGHT LOWER EXTREMITY Common Femoral Vein: No evidence of thrombus. Normal compressibility, respiratory phasicity and response to augmentation. Saphenofemoral Junction: No evidence of thrombus. Normal compressibility and flow on color Doppler imaging. Profunda Femoral Vein: No evidence of thrombus. Normal compressibility and flow on color Doppler imaging. Femoral Vein: No evidence of thrombus. Normal compressibility, respiratory phasicity and response to augmentation. Popliteal Vein: No evidence of thrombus. Normal compressibility, respiratory phasicity and response to augmentation. Calf Veins: No evidence of thrombus. Normal compressibility and flow on color Doppler imaging. Superficial Great Saphenous Vein: No evidence of thrombus. Normal compressibility. Venous Reflux:  None. Other Findings:  None. LEFT LOWER EXTREMITY Common Femoral Vein: No evidence of thrombus. Normal compressibility, respiratory phasicity and response to augmentation. Saphenofemoral Junction: No evidence of thrombus. Normal compressibility and flow on color Doppler imaging. Profunda Femoral Vein: No evidence of thrombus. Normal compressibility and flow on color Doppler imaging. Femoral Vein: No evidence of thrombus. Normal compressibility, respiratory phasicity and response to augmentation. Popliteal Vein: No evidence of thrombus. Normal compressibility, respiratory phasicity and response to augmentation. Calf Veins: No evidence of thrombus. Normal compressibility and flow on color Doppler imaging. Superficial Great Saphenous Vein: No evidence of thrombus. Normal compressibility. Venous Reflux:  None. Other Findings:  None. IMPRESSION: No  evidence of DVT within either lower extremity Electronically Signed   By: Sandi Mariscal M.D.   On: 03/30/2018 09:59    EKG:   Orders placed or performed during the hospital encounter of 03/29/18  . EKG 12-Lead  . EKG 12-Lead  . ED EKG within 10 minutes  . ED EKG within 10 minutes    ASSESSMENT AND PLAN:     #Acute left-sided  weakness with headache In -  patient code stroke activated Stat CT head ordered patient is made n.p.o. discussed with Dr. Doy Mince Patient is not a candidate for TPA Patient will be transferred to ICU immediately after CT head Intensivist consult placed Patient will get aspirin rectally if CT head is negative for intracranial bleed NIH stroke scale3-4 MRI of the brain, carotid Dopplers and 2D echocardiogram ordered neurochecks Check fasting lipid panel Hemoglobin A1c 11.8 Allow permissive hypertension   #Chest pain Acute MI ruled out with negative troponins Patient had first part of the stress test today scheduled for second part of the stress test tomorrow, Seen by cardiology Dr. Nehemiah Massed  #Elevated D-dimers at 3800 CT angiogram of the chest with no pulmonary embolism  bilateral lower extremity venous Dopplers-no DVT  # diabetes-uncontrolled hemoglobin A1c 11.8   Continue her home dose of Lantus and titrate the dose dose of NovoLog and keep on sliding scale coverage.   Will hold her oral medications for now. Outpatient endocrinology follow-up  * hypertension   allow permissive hypertension in view of code stroke  * hyperlipidemia   Continue her statin for now.      All the records are reviewed and case discussed with Care Management/Social Workerr. Management plans discussed with the patient, family and they are in agreement.  CODE STATUS: fc , husband is hcpoa  TOTAL CRITICAL CARE TIME TAKING CARE OF THIS PATIENT: 39  minutes.   POSSIBLE D/C IN 2-3 DAYS, DEPENDING ON CLINICAL CONDITION.  Note: This dictation was prepared with  Dragon dictation along with smaller phrase technology. Any transcriptional errors that result from this process are unintentional.   Nicholes Mango M.D on 03/30/2018 at 3:06 PM  Between 7am to 6pm - Pager - [phone] After 6pm go to [email or web address] - password [email or web address] [facility]  [facility]  Office  [phone]  CC: Primary care physician; Lorelee Market, MD

## 2018-03-30 NOTE — Consult Note (Signed)
[facility] Consultation Note  Patient ID: Brenda Hicks, MRN: [id number], DOB/AGE: 1966/04/22 52 y.o. Admit date: 03/29/2018   Date of Consult: 03/30/2018 Primary Physician: Lorelee Market, MD Primary Cardiologist: None  Chief Complaint:  Chief Complaint  Patient presents with  . Chest Pain   Reason for Consult: Chest pain  HPI: 52 y.o. female with known diabetes essential hypertension mixed hyperlipidemia sleep apnea with increasing shortness of breath with physical activity in recent months possibly concerning for CPAP machine not working quite as well.  She is trying to exercise some as well as diet for significant excess weight and has been somewhat successful.  With this she has not had any true chest pain but is weak fatigued and somewhat short of breath.  She also has limb pain that reduces her exercise tolerance.  The patient was driving when she had substernal chest discomfort and shortness of breath waxing and waning radiating into her back lasting for several moments.  She was seen in the emergency room with some relief with chest discomfort with nitroglycerin and has not had any chest pain since.  EKG at that time showed normal sinus rhythm with nonspecific T wave changes and her troponin has been normal.  Since then she is done fairly well with no cervical significant symptoms  Past Medical History:  Diagnosis Date  . Arthritis   . Asthma    WELL CONTROLLED  . Cancer of ear   . Diabetes mellitus without complication ([place])   . Fatty liver   . Hypertension   . Kidney cysts   . Renal disorder   . Sleep apnea    USES CPAP      Surgical History:  Past Surgical History:  Procedure Laterality Date  . DILATION AND CURETTAGE OF UTERUS    . ENDOMETRIAL BIOPSY    . EXPLORATORY LAPAROTOMY     REMOVAL OF RUPTURED ECTOPIC  . HAND SURGERY    . HERNIA REPAIR     UMBILICAL  . JOINT REPLACEMENT Right    TKR  . KNEE ARTHROSCOPY    . KNEE SURGERY Right   .  SHOULDER ARTHROSCOPY WITH BICEPSTENOTOMY Left 12/14/2016   Procedure: SHOULDER ARTHROSCOPY WITH BICEPSTENOTOMY;  Surgeon: Corky Mull, MD;  Location: [facility] ORS;  Service: Orthopedics;  Laterality: Left;  . SHOULDER ARTHROSCOPY WITH OPEN ROTATOR CUFF REPAIR Left 12/14/2016   Procedure: SHOULDER ARTHROSCOPY WITH OPEN ROTATOR CUFF REPAIR AND ARTHROSCOPIC ROTATOR CUFF REPAIR;  Surgeon: Corky Mull, MD;  Location: [facility] ORS;  Service: Orthopedics;  Laterality: Left;  . SHOULDER ARTHROSCOPY WITH SUBACROMIAL DECOMPRESSION Left 12/14/2016   Procedure: SHOULDER ARTHROSCOPY WITH SUBACROMIAL DECOMPRESSION;  Surgeon: Corky Mull, MD;  Location: [facility] ORS;  Service: Orthopedics;  Laterality: Left;  . TUBAL LIGATION       Home Meds: Prior to Admission medications   Medication Sig Start Date End Date Taking? Authorizing Provider  albuterol (PROVENTIL HFA;VENTOLIN HFA) 108 (90 Base) MCG/ACT inhaler Inhale 2 puffs into the lungs every 6 (six) hours as needed for wheezing or shortness of breath.   Yes [provider]  aspirin EC 81 MG tablet Take 81 mg by mouth daily.  01/14/16  Yes [provider]  atorvastatin (LIPITOR) 40 MG tablet Take 1 tablet (40 mg total) by mouth daily. 12/07/15 03/29/18 Yes Summers, Rhonda L, PA-C  FLOVENT HFA 220 MCG/ACT inhaler Inhale 2 puffs into the lungs 2 (two) times daily. 01/25/18  Yes [provider]  furosemide (LASIX) 40 MG  tablet Take 1 tablet by mouth daily. 01/31/18  Yes [provider]  gabapentin (NEURONTIN) 300 MG capsule Take 300 mg by mouth 3 (three) times daily.    Yes [provider]  insulin glargine (LANTUS) 100 UNIT/ML injection Inject 1 mL (100 Units total) into the skin at bedtime. 11/22/17  Yes Letitia Neri L, PA-C  insulin lispro (HUMALOG) 100 UNIT/ML injection Inject 0.2 mLs (20 Units total) into the skin 3 (three) times daily with meals. 11/22/17 04/18/19 Yes Letitia Neri L, PA-C  lisinopril (PRINIVIL,ZESTRIL) 40 MG tablet  Take 40 mg by mouth every evening.  01/14/16 03/29/18 Yes [provider]  metFORMIN (GLUCOPHAGE) 1000 MG tablet Take 1 tablet by mouth 2 (two) times daily. 01/25/18  Yes [provider]  potassium chloride (K-DUR) 10 MEQ tablet Take 1 tablet by mouth daily. 01/31/18  Yes [provider]  simvastatin (ZOCOR) 20 MG tablet Take 1 tablet by mouth daily. 01/31/18  Yes [provider]  sitaGLIPtin (JANUVIA) 100 MG tablet Take 100 mg by mouth daily. Patient reports taking Januvia 100 mg daily   Yes [provider]  STIOLTO RESPIMAT 2.5-2.5 MCG/ACT AERS Take 2 puffs by mouth daily. 01/26/18  Yes [provider]  traZODone (DESYREL) 50 MG tablet Take 1 tablet by mouth at bedtime. 01/25/18  Yes [provider]  doxycycline (VIBRA-TABS) 100 MG tablet Take 1 tablet (100 mg total) by mouth every 12 (twelve) hours. Patient not taking: Reported on 01/30/2018 06/15/17   Hillary Bow, MD  Elastic Bandages & Supports (MEDICAL COMPRESSION STOCKINGS) MISC 2 medium stockings Use as directed on both lower legs 01/30/18   Arta Silence, MD  HYDROcodone-acetaminophen (NORCO/VICODIN) 5-325 MG tablet Take 1 tablet by mouth every 8 (eight) hours as needed for moderate pain. Patient not taking: Reported on 03/29/2018 11/22/17   Johnn Hai, PA-C  meloxicam (MOBIC) 7.5 MG tablet Take 1 tablet by mouth 2 (two) times daily. 01/25/18   [provider]  orphenadrine (NORFLEX) 100 MG tablet Take 1 tablet (100 mg total) by mouth 2 (two) times daily as needed for muscle spasms. 11/22/17   Johnn Hai, PA-C  tiZANidine (ZANAFLEX) 4 MG tablet Take 1 tablet by mouth every 8 (eight) hours as needed for muscle spasms.  01/25/18   [provider]  zolpidem (AMBIEN) 5 MG tablet Take 1 tablet (5 mg total) by mouth at bedtime as needed for sleep. Patient not taking: Reported on 01/30/2018 12/14/16   Poggi, Marshall Cork, MD    Inpatient Medications:  . aspirin EC  81 mg  Oral Daily  . atorvastatin  40 mg Oral Daily  . furosemide  40 mg Oral Daily  . gabapentin  300 mg Oral TID  . heparin  5,000 Units Subcutaneous Q8H  . insulin aspart  0-9 Units Subcutaneous TID WC  . insulin aspart  10 Units Subcutaneous TID WC  . insulin glargine  100 Units Subcutaneous QHS  . lisinopril  40 mg Oral QPM  . meloxicam  7.5 mg Oral BID  . metoprolol tartrate  12.5 mg Oral BID  . potassium chloride  10 mEq Oral Daily  . simvastatin  20 mg Oral Daily  . sodium chloride flush  3 mL Intravenous Q12H  . traZODone  50 mg Oral QHS     Allergies: No Known Allergies  Social History   Socioeconomic History  . Marital status: Married    Spouse name: Not on file  . Number of children: Not on  file  . Years of education: Not on file  . Highest education level: Not on file  Occupational History  . Not on file  Social Needs  . Financial resource strain: Not on file  . Food insecurity:    Worry: Not on file    Inability: Not on file  . Transportation needs:    Medical: Not on file    Non-medical: Not on file  Tobacco Use  . Smoking status: Former Smoker    Packs/day: 2.00    Years: 25.00    Pack years: 50.00    Types: Cigarettes  . Smokeless tobacco: Never Used  Substance and Sexual Activity  . Alcohol use: No  . Drug use: No  . Sexual activity: Yes    Birth control/protection: IUD  Lifestyle  . Physical activity:    Days per week: Not on file    Minutes per session: Not on file  . Stress: Not on file  Relationships  . Social connections:    Talks on phone: Not on file    Gets together: Not on file    Attends religious service: Not on file    Active member of club or organization: Not on file    Attends meetings of clubs or organizations: Not on file    Relationship status: Not on file  . Intimate partner violence:    Fear of current or ex partner: Not on file    Emotionally abused: Not on file    Physically abused: Not on file    Forced sexual  activity: Not on file  Other Topics Concern  . Not on file  Social History Narrative  . Not on file     Family History  Problem Relation Age of Onset  . CAD Maternal Grandmother      Review of Systems Positive for chest pain shortness of breath Negative for: General:  chills, fever, night sweats or weight changes.  Cardiovascular: PND orthopnea syncope dizziness  Dermatological skin lesions rashes Respiratory: Cough congestion Urologic: Frequent urination urination at night and hematuria Abdominal: negative for nausea, vomiting, diarrhea, bright red blood per rectum, melena, or hematemesis Neurologic: negative for visual changes, and/or hearing changes  All other systems reviewed and are otherwise negative except as noted above.  Labs: Recent Labs    03/29/18 1347 03/29/18 1836 03/29/18 2112 03/30/18 0222  TROPONINI <0.03 <0.03 <0.03 <0.03   Lab Results  Component Value Date   WBC 6.3 03/30/2018   HGB 13.2 03/30/2018   HCT 39.4 03/30/2018   MCV 93.0 03/30/2018   PLT 234 03/30/2018    Recent Labs  Lab 03/30/18 0222  NA 137  K 3.5  CL 99*  CO2 32  BUN 16  CREATININE 0.86  CALCIUM 8.5*  GLUCOSE 195*   Lab Results  Component Value Date   CHOL 174 03/29/2018   HDL 36 (L) 03/29/2018   LDLCALC 116 (H) 03/29/2018   TRIG 112 03/29/2018   No results found for: DDIMER  Radiology/Studies:  Dg Chest 2 View  Result Date: 03/29/2018 CLINICAL DATA:  Chest pain. EXAM: CHEST - 2 VIEW COMPARISON:  Radiographs of January 30, 2018. FINDINGS: The heart size and mediastinal contours are within normal limits. Both lungs are clear. No pneumothorax or pleural effusion is noted. The visualized skeletal structures are unremarkable. IMPRESSION: No active cardiopulmonary disease. Electronically Signed   By: Marijo Conception, M.D.   On: 03/29/2018 10:18   Ct Angio Chest Pe W And/or Wo Contrast  Result Date: 03/29/2018 CLINICAL DATA:  Chest pain and shortness of breath EXAM: CT  ANGIOGRAPHY CHEST WITH CONTRAST TECHNIQUE: Multidetector CT imaging of the chest was performed using the standard protocol during bolus administration of intravenous contrast. Multiplanar CT image reconstructions and MIPs were obtained to evaluate the vascular anatomy. CONTRAST:  8mL ISOVUE-370 IOPAMIDOL (ISOVUE-370) INJECTION 76% COMPARISON:  Chest radiograph 03/29/2018 FINDINGS: Cardiovascular: Contrast injection is sufficient to demonstrate satisfactory opacification of the pulmonary arteries to the segmental level. There is no pulmonary embolus. The main pulmonary artery is within normal limits for size. There is a normal 3-vessel arch branching pattern without evidence of acute aortic syndrome. There is noaortic atherosclerosis. Heart size is normal, without pericardial effusion. Mediastinum/Nodes: No mediastinal, hilar or axillary lymphadenopathy. The visualized thyroid and thoracic esophageal course are unremarkable. Lungs/Pleura: No pulmonary nodules or masses. No pleural effusion or pneumothorax. No focal airspace consolidation. No focal pleural abnormality. Upper Abdomen: Contrast bolus timing is not optimized for evaluation of the abdominal organs. Hepatomegaly with hepatic steatosis. Musculoskeletal: No chest wall abnormality. No acute or significant osseous findings. Review of the MIP images confirms the above findings. IMPRESSION: 1. No pulmonary embolus or acute aortic syndrome. 2. Clear lungs. 3. Hepatomegaly and attic steatosis. Electronically Signed   By: Ulyses Jarred M.D.   On: 03/29/2018 17:19    EKG: Normal sinus rhythm with nonspecific T wave change  Weights: Filed Weights   03/29/18 0930 03/29/18 2300  Weight: (!) 302 lb (137 kg) (!) 335 lb 6.4 oz (152.1 kg)     Physical Exam: Blood pressure 135/70, pulse 88, temperature 98.1 F (36.7 C), temperature source Oral, resp. rate 18, height 5\' 6"  (1.676 m), weight (!) 335 lb 6.4 oz (152.1 kg), SpO2 95 %. Body mass index is 54.13  kg/m. General: Well developed, well nourished, in no acute distress. Head eyes ears nose throat: Normocephalic, atraumatic, sclera non-icteric, no xanthomas, nares are without discharge. No apparent thyromegaly and/or mass  Lungs: Normal respiratory effort.  no wheezes, no rales, no rhonchi.  Heart: RRR with normal S1 S2. no murmur gallop, no rub, PMI is normal size and placement, carotid upstroke normal without bruit, jugular venous pressure is normal Abdomen: Soft, non-tender, non-distended with normoactive bowel sounds. No hepatomegaly. No rebound/guarding. No obvious abdominal masses. Abdominal aorta is normal size without bruit Extremities: No edema. no cyanosis, no clubbing, no ulcers  Peripheral : 2+ bilateral upper extremity pulses, 2+ bilateral femoral pulses, 2+ bilateral dorsal pedal pulse Neuro: Alert and oriented. No facial asymmetry. No focal deficit. Moves all extremities spontaneously. Musculoskeletal: Normal muscle tone without kyphosis Psych:  Responds to questions appropriately with a normal affect.    Assessment: 52 year old female with diabetes hypertension hyperlipidemia sleep apnea with atypical chest discomfort and no current evidence of myocardial infarction  Plan: 1.  Continue surveillance and see EKG and troponin levels 2.  Lexiscan infusion Myoview to assess for myocardial and ischemia and further treatment thereof is necessary 3.  High intensity cholesterol therapy 4.  Hypertension control with ACE inhibitor beta-blocker as able 4.  Continue treatment of diabetes as before 6.  Further treatment options after above  Signed, Corey Skains M.D. [facility] 03/30/2018, 8:30 AM

## 2018-03-30 NOTE — Progress Notes (Signed)
Chaplain responded to a Rapid Response. Chaplain prayed for Pt and care team. Pt prayed with chaplain. Pt said she was fine as they prepared to go to CT.    03/30/18 1500  Clinical Encounter Type  Visited With Patient  Visit Type Spiritual support  Referral From Nurse  Spiritual Encounters  Spiritual Needs Prayer;Emotional

## 2018-03-30 NOTE — Progress Notes (Signed)
CODE STROKE- PHARMACY COMMUNICATION   Time CODE STROKE called/page received:1458  Time response to CODE STROKE was made (in person or via phone): 1500  Time Stroke Kit retrieved from [facility] (only if needed):  Name of Provider/Nurse contacted:  Past Medical History:  Diagnosis Date  . Arthritis   . Asthma    WELL CONTROLLED  . Cancer of ear   . Diabetes mellitus without complication ([place])   . Fatty liver   . Hypertension   . Kidney cysts   . Renal disorder   . Sleep apnea    USES CPAP   Prior to Admission medications   Medication Sig Start Date End Date Taking? Authorizing Provider  albuterol (PROVENTIL HFA;VENTOLIN HFA) 108 (90 Base) MCG/ACT inhaler Inhale 2 puffs into the lungs every 6 (six) hours as needed for wheezing or shortness of breath.   Yes [provider]  aspirin EC 81 MG tablet Take 81 mg by mouth daily.  01/14/16  Yes [provider]  atorvastatin (LIPITOR) 40 MG tablet Take 1 tablet (40 mg total) by mouth daily. 12/07/15 03/29/18 Yes Summers, Rhonda L, PA-C  FLOVENT HFA 220 MCG/ACT inhaler Inhale 2 puffs into the lungs 2 (two) times daily. 01/25/18  Yes [provider]  furosemide (LASIX) 40 MG tablet Take 1 tablet by mouth daily. 01/31/18  Yes [provider]  gabapentin (NEURONTIN) 300 MG capsule Take 300 mg by mouth 3 (three) times daily.    Yes [provider]  insulin glargine (LANTUS) 100 UNIT/ML injection Inject 1 mL (100 Units total) into the skin at bedtime. 11/22/17  Yes Letitia Neri L, PA-C  insulin lispro (HUMALOG) 100 UNIT/ML injection Inject 0.2 mLs (20 Units total) into the skin 3 (three) times daily with meals. 11/22/17 04/18/19 Yes Letitia Neri L, PA-C  lisinopril (PRINIVIL,ZESTRIL) 40 MG tablet Take 40 mg by mouth every evening.  01/14/16 03/29/18 Yes [provider]  metFORMIN (GLUCOPHAGE) 1000 MG tablet Take 1 tablet by mouth 2 (two) times daily. 01/25/18  Yes [provider]  potassium chloride  (K-DUR) 10 MEQ tablet Take 1 tablet by mouth daily. 01/31/18  Yes [provider]  simvastatin (ZOCOR) 20 MG tablet Take 1 tablet by mouth daily. 01/31/18  Yes [provider]  sitaGLIPtin (JANUVIA) 100 MG tablet Take 100 mg by mouth daily. Patient reports taking Januvia 100 mg daily   Yes [provider]  STIOLTO RESPIMAT 2.5-2.5 MCG/ACT AERS Take 2 puffs by mouth daily. 01/26/18  Yes [provider]  traZODone (DESYREL) 50 MG tablet Take 1 tablet by mouth at bedtime. 01/25/18  Yes [provider]  doxycycline (VIBRA-TABS) 100 MG tablet Take 1 tablet (100 mg total) by mouth every 12 (twelve) hours. Patient not taking: Reported on 01/30/2018 06/15/17   Hillary Bow, MD  Elastic Bandages & Supports (MEDICAL COMPRESSION STOCKINGS) MISC 2 medium stockings Use as directed on both lower legs 01/30/18   Arta Silence, MD  HYDROcodone-acetaminophen (NORCO/VICODIN) 5-325 MG tablet Take 1 tablet by mouth every 8 (eight) hours as needed for moderate pain. Patient not taking: Reported on 03/29/2018 11/22/17   Johnn Hai, PA-C  meloxicam (MOBIC) 7.5 MG tablet Take 1 tablet by mouth 2 (two) times daily. 01/25/18   [provider]  orphenadrine (NORFLEX) 100 MG tablet Take 1 tablet (100 mg total) by mouth 2 (two) times daily as needed for muscle spasms. 11/22/17   Johnn Hai, PA-C  tiZANidine (ZANAFLEX) 4 MG tablet Take 1 tablet by mouth  every 8 (eight) hours as needed for muscle spasms.  01/25/18   [provider]  zolpidem (AMBIEN) 5 MG tablet Take 1 tablet (5 mg total) by mouth at bedtime as needed for sleep. Patient not taking: Reported on 01/30/2018 12/14/16   Poggi, Marshall Cork, MD    Napoleon Form ,PharmD Clinical Pharmacist  03/30/2018  3:17 PM

## 2018-03-30 NOTE — Consult Note (Addendum)
Name: Brenda Hicks MRN: [id number] DOB: 29-Feb-1966    ADMISSION DATE:  03/29/2018 CONSULTATION DATE: 03/29/2018  REFERRING MD : Dr. Margaretmary Eddy  CHIEF COMPLAINT: Chest Pain   BRIEF PATIENT DESCRIPTION: 52 yo female admitted to telemetry unit on 05/8 with atypical angina transferred to ICU 05/9 with left sided weakness concerning for possible CVA vs. TIA   SIGNIFICANT EVENTS  05/8-Pt admitted to telemetry unit  05/9-Transferred to ICU due to Code Stroke   STUDIES:  CTA Chest 05/8>>No pulmonary embolus or acute aortic syndrome. Clear lungs. Hepatomegaly and attic steatosis CT Angio Head/Neck 05/9>>No acute cortically based infarct or acute intracranial hemorrhage identified by CT. Preliminary report of this was communicated to Dr. Alexis Goodell at 1601 hours on 5/9/2019by text page via the [facility] messaging system. CTA is negative for large vessel occlusion. This result was communicated to Dr. Alexis Goodell at 1609 hours via the [facility] messaging system. Stable noncontrast CT appearance of the brain since 2017, negative aside from chronic partially empty sella. The great vessel origins are obscured by dense venous contrast at the left thoracic inlet. Otherwise no atherosclerosis or arterial stenosis is identified. US Venous Bilateral Lower Extrem 05/9>>No evidence of DVT within either lower extremity Echo 05/10>> Lexiscan Infusion Myoview 05/10>>  HISTORY OF PRESENT ILLNESS:   This is a 52 yo female with a PMH of OSA uses Bipap, Kidney Cysts, HTN, Fatty Liver, Renal Disorder, Diabetes Mellitus, Asthma, and Arthritis. She presented to [facility] ER on 05/8 with c/o left sided chest pain with radiation to the center of her back, left shoulder, and neck onset 3 days prior to presentation.  She also endorsed nausea and shortness of breath. In the ER initial EKG revealed normal sinus rhythm, hr 71, normal ST segments and T waves. CXR and initial troponin negative.  She was subsequently admitted to the  telemetry unit for further workup and treatment. On 05/9 pt developed upper and lower extremity left sided weakness code stroke initiated and pt transferred to ICU.  CT Angio Head/Neck negative. Per hospitalist progress note case discussed with neurologist Dr. Doy Mince pt not a TPA candidate  PAST MEDICAL HISTORY :   has a past medical history of Arthritis, Asthma, Cancer of ear, Diabetes mellitus without complication ([place]), Fatty liver, Hypertension, Kidney cysts, Renal disorder, and Sleep apnea.  has a past surgical history that includes Knee surgery (Right); Dilation and curettage of uterus; Endometrial biopsy; Exploratory laparotomy; Hand surgery; Hernia repair; Tubal ligation; Knee arthroscopy; Joint replacement (Right); Shoulder arthroscopy with open rotator cuff repair (Left, 12/14/2016); Shoulder arthroscopy with subacromial decompression (Left, 12/14/2016); and Shoulder arthroscopy with bicepstenotomy (Left, 12/14/2016). Prior to Admission medications   Medication Sig Start Date End Date Taking? Authorizing Provider  albuterol (PROVENTIL HFA;VENTOLIN HFA) 108 (90 Base) MCG/ACT inhaler Inhale 2 puffs into the lungs every 6 (six) hours as needed for wheezing or shortness of breath.   Yes [provider]  aspirin EC 81 MG tablet Take 81 mg by mouth daily.  01/14/16  Yes [provider]  atorvastatin (LIPITOR) 40 MG tablet Take 1 tablet (40 mg total) by mouth daily. 12/07/15 03/29/18 Yes Summers, Rhonda L, PA-C  FLOVENT HFA 220 MCG/ACT inhaler Inhale 2 puffs into the lungs 2 (two) times daily. 01/25/18  Yes [provider]  furosemide (LASIX) 40 MG tablet Take 1 tablet by mouth daily. 01/31/18  Yes [provider]  gabapentin (NEURONTIN) 300 MG capsule Take 300 mg by mouth 3 (three) times daily.    Yes  [provider]  insulin glargine (LANTUS) 100 UNIT/ML injection Inject 1 mL (100 Units total) into the skin at bedtime. 11/22/17  Yes Letitia Neri L, PA-C    insulin lispro (HUMALOG) 100 UNIT/ML injection Inject 0.2 mLs (20 Units total) into the skin 3 (three) times daily with meals. 11/22/17 04/18/19 Yes Letitia Neri L, PA-C  lisinopril (PRINIVIL,ZESTRIL) 40 MG tablet Take 40 mg by mouth every evening.  01/14/16 03/29/18 Yes [provider]  metFORMIN (GLUCOPHAGE) 1000 MG tablet Take 1 tablet by mouth 2 (two) times daily. 01/25/18  Yes [provider]  potassium chloride (K-DUR) 10 MEQ tablet Take 1 tablet by mouth daily. 01/31/18  Yes [provider]  simvastatin (ZOCOR) 20 MG tablet Take 1 tablet by mouth daily. 01/31/18  Yes [provider]  sitaGLIPtin (JANUVIA) 100 MG tablet Take 100 mg by mouth daily. Patient reports taking Januvia 100 mg daily   Yes [provider]  STIOLTO RESPIMAT 2.5-2.5 MCG/ACT AERS Take 2 puffs by mouth daily. 01/26/18  Yes [provider]  traZODone (DESYREL) 50 MG tablet Take 1 tablet by mouth at bedtime. 01/25/18  Yes [provider]  doxycycline (VIBRA-TABS) 100 MG tablet Take 1 tablet (100 mg total) by mouth every 12 (twelve) hours. Patient not taking: Reported on 01/30/2018 06/15/17   Hillary Bow, MD  Elastic Bandages & Supports (MEDICAL COMPRESSION STOCKINGS) MISC 2 medium stockings Use as directed on both lower legs 01/30/18   Arta Silence, MD  HYDROcodone-acetaminophen (NORCO/VICODIN) 5-325 MG tablet Take 1 tablet by mouth every 8 (eight) hours as needed for moderate pain. Patient not taking: Reported on 03/29/2018 11/22/17   Johnn Hai, PA-C  meloxicam (MOBIC) 7.5 MG tablet Take 1 tablet by mouth 2 (two) times daily. 01/25/18   [provider]  orphenadrine (NORFLEX) 100 MG tablet Take 1 tablet (100 mg total) by mouth 2 (two) times daily as needed for muscle spasms. 11/22/17   Johnn Hai, PA-C  tiZANidine (ZANAFLEX) 4 MG tablet Take 1 tablet by mouth every 8 (eight) hours as needed for muscle spasms.  01/25/18   [provider]   zolpidem (AMBIEN) 5 MG tablet Take 1 tablet (5 mg total) by mouth at bedtime as needed for sleep. Patient not taking: Reported on 01/30/2018 12/14/16   Poggi, Marshall Cork, MD   No Known Allergies  FAMILY HISTORY:  family history includes CAD in her maternal grandmother. SOCIAL HISTORY:  reports that she has quit smoking. Her smoking use included cigarettes. She has a 50.00 pack-year smoking history. She has never used smokeless tobacco. She reports that she does not drink alcohol or use drugs.  REVIEW OF SYSTEMS: Positives in BOLD  Constitutional: Negative for fever, chills, weight loss, malaise/fatigue and diaphoresis.  HENT: Negative for hearing loss, ear pain, nosebleeds, congestion, sore throat, neck pain, tinnitus and ear discharge.   Eyes: Negative for blurred vision, double vision, photophobia, pain, discharge and redness.  Respiratory: Negative for cough, hemoptysis, sputum production, shortness of breath, wheezing and stridor.   Cardiovascular: Negative for chest pain, palpitations, orthopnea, claudication, leg swelling and PND.  Gastrointestinal: Negative for heartburn, nausea, vomiting, abdominal pain, diarrhea, constipation, blood in stool and melena.  Genitourinary: Negative for dysuria, urgency, frequency, hematuria and flank pain.  Musculoskeletal: Negative for myalgias, back pain, joint pain and falls.  Skin: Negative for itching and rash.  Neurological: dizziness, tingling, tremors, sensory change, speech change, focal weakness, seizures, loss of consciousness, left upper and lower extremity weakness and headaches.  Endo/Heme/Allergies: Negative for environmental allergies and polydipsia. Does not bruise/bleed easily.  SUBJECTIVE:  Pt states left sided weakness has improved significantly no other complaints at this time.  VITAL SIGNS: Temp:  [97.9 F (36.6 C)-98.5 F (36.9 C)] 98.5 F (36.9 C) (05/09 1502) Pulse Rate:  [75-88] 79 (05/09 1900) Resp:  [13-19] 15 (05/09  1900) BP: (130-182)/(66-103) 153/93 (05/09 1900) SpO2:  [89 %-100 %] 89 % (05/09 1900) Weight:  [152.1 kg (335 lb 6.4 oz)] 152.1 kg (335 lb 6.4 oz) (05/08 2300)  PHYSICAL EXAMINATION: General: well developed and nourished, NAD  Neuro: alert and oriented, follows commands, PERRLA, strong bilateral hand grips and dorsiflexion/extension  HEENT: supple, no JVD  Cardiovascular: nsr, rrr, no R/G Lungs: faint rhonchi throughout, even, non labored Abdomen: +BS x4, soft, non tender, non distended  Musculoskeletal: normal bulk and tone, no edema Skin: intact no rashes or lesions   Recent Labs  Lab 03/29/18 0945 03/30/18 0222  NA 137 137  K 3.8 3.5  CL 99* 99*  CO2 30 32  BUN 17 16  CREATININE 0.94 0.86  GLUCOSE 229* 195*   Recent Labs  Lab 03/29/18 0945 03/30/18 0222  HGB 12.9 13.2  HCT 38.6 39.4  WBC 5.5 6.3  PLT 229 234   Ct Angio Head W Or Wo Contrast  Result Date: 03/30/2018 CLINICAL DATA:  52 year old female with weakness in the extremities beginning 1 hour ago. Code stroke. EXAM: CT ANGIOGRAPHY HEAD AND NECK TECHNIQUE: Multidetector CT imaging of the head and neck was performed using the standard protocol during bolus administration of intravenous contrast. Multiplanar CT image reconstructions and MIPs were obtained to evaluate the vascular anatomy. Carotid stenosis measurements (when applicable) are obtained utilizing NASCET criteria, using the distal internal carotid diameter as the denominator. CONTRAST:  36mL ISOVUE-370 IOPAMIDOL (ISOVUE-370) INJECTION 76% COMPARISON:  Head and face CTs 10/13/2016. Cervical spine MRI 05/10/2017. FINDINGS: CT HEAD FINDINGS Brain: Cerebral volume remains normal. No midline shift, ventriculomegaly, mass effect, evidence of mass lesion, or cortically based acute infarction. ASPECTS 10. Within the medial periventricular white matter of the posterior left temporal lobe a new small 6 mm hyperdense area seen on series 13, image 13 on the reformatted  images appears to reflect stable normal choroid plexus density (compare sagittal image 36 today to sagittal image 39 in 2017). No acute intracranial hemorrhage identified. Gray-white matter differentiation remains normal. Chronic partially empty sella. Calvarium and skull base: Stable and negative. Paranasal sinuses: Stable and negative. Orbits: Stable and negative. CTA NECK Skeleton: No acute osseous abnormality identified. Upper chest: Negative lung apices. No superior mediastinal lymphadenopathy. Other neck: The glottis is closed. Large body habitus. Negative for neck mass or lymphadenopathy. Aortic arch: Obscured proximal great vessels due to dense left subclavian and innominate vein contrast streak artifact. No aortic arch atherosclerosis is evident. Right carotid system: The right CCA origin is visible and normal. The right CCA, right carotid bifurcation and cervical right ICA are patent and appear negative. Left carotid system: The left CCA origin is obscured. The left CCA is normally enhancing at the level of the thyroid. The left carotid bifurcation and cervical left ICA are patent without stenosis. The left ICA appears non dominant relative to the right, see intracranial findings below. Vertebral arteries: The proximal right subclavian artery is patent, its origin is grossly normal. The right vertebral artery origin appears normal. The right vertebral artery is patent to the skull base. No stenosis is evident. Proximal left subclavian artery is patent. The left  vertebral artery origin appears normal. The left vertebral artery is patent to the skull base. No stenosis is evident. CTA HEAD Posterior circulation: The distal vertebral arteries are codominant and appear normal to the vertebrobasilar junction. Patent left PICA origin. The right AICA may be dominant. Patent basilar artery, AICA origins the and SCA origins. The left PCA origin is normal while there is a fetal type right PCA origin. A small left  posterior communicating artery is visible. The bilateral PCA branches are within normal limits. Anterior circulation: Both ICA siphons are patent and the right is dominant owing to a fetal type right PCA origin and dominant right ACA A1 segment with diminutive or absent left ACA A1. No siphon atherosclerosis or stenosis is identified. The posterior communicating artery origins are normal. The MCA and right ACA origins are normal. The anterior communicating artery is normal. The proximal A2 segments are mildly tortuous. The bilateral ACA branches are within normal limits. The right MCA M1 segment, bifurcation, and right MCA branches are within normal limits. The left MCA M1 segment, left MCA bifurcation, and left MCA branches appear patent and within normal limits. Venous sinuses: Patent. Anatomic variants: Fetal type right PCA origin. Dominant right and diminutive or absent left ACA A1 segments. Review of the MIP images confirms the above findings IMPRESSION: 1. No acute cortically based infarct or acute intracranial hemorrhage identified by CT. Preliminary report of this was communicated to Dr. Alexis Goodell at 1601 hours on 5/9/2019by text page via the [facility] messaging system. 2. CTA is negative for large vessel occlusion. This result was communicated to Dr. Alexis Goodell at 1609 hours via the [facility] messaging system. 3. Stable noncontrast CT appearance of the brain since 2017, negative aside from chronic partially empty sella. 4. The great vessel origins are obscured by dense venous contrast at the left thoracic inlet. Otherwise no atherosclerosis or arterial stenosis is identified. Electronically Signed   By: Genevie Ann M.D.   On: 03/30/2018 16:13   Dg Chest 2 View  Result Date: 03/29/2018 CLINICAL DATA:  Chest pain. EXAM: CHEST - 2 VIEW COMPARISON:  Radiographs of January 30, 2018. FINDINGS: The heart size and mediastinal contours are within normal limits. Both lungs are clear. No pneumothorax or pleural  effusion is noted. The visualized skeletal structures are unremarkable. IMPRESSION: No active cardiopulmonary disease. Electronically Signed   By: Marijo Conception, M.D.   On: 03/29/2018 10:18   Ct Angio Neck W Or Wo Contrast  Result Date: 03/30/2018 CLINICAL DATA:  52 year old female with weakness in the extremities beginning 1 hour ago. Code stroke. EXAM: CT ANGIOGRAPHY HEAD AND NECK TECHNIQUE: Multidetector CT imaging of the head and neck was performed using the standard protocol during bolus administration of intravenous contrast. Multiplanar CT image reconstructions and MIPs were obtained to evaluate the vascular anatomy. Carotid stenosis measurements (when applicable) are obtained utilizing NASCET criteria, using the distal internal carotid diameter as the denominator. CONTRAST:  38mL ISOVUE-370 IOPAMIDOL (ISOVUE-370) INJECTION 76% COMPARISON:  Head and face CTs 10/13/2016. Cervical spine MRI 05/10/2017. FINDINGS: CT HEAD FINDINGS Brain: Cerebral volume remains normal. No midline shift, ventriculomegaly, mass effect, evidence of mass lesion, or cortically based acute infarction. ASPECTS 10. Within the medial periventricular white matter of the posterior left temporal lobe a new small 6 mm hyperdense area seen on series 13, image 13 on the reformatted images appears to reflect stable normal choroid plexus density (compare sagittal image 36 today to sagittal image 39 in 2017). No acute intracranial hemorrhage  identified. Gray-white matter differentiation remains normal. Chronic partially empty sella. Calvarium and skull base: Stable and negative. Paranasal sinuses: Stable and negative. Orbits: Stable and negative. CTA NECK Skeleton: No acute osseous abnormality identified. Upper chest: Negative lung apices. No superior mediastinal lymphadenopathy. Other neck: The glottis is closed. Large body habitus. Negative for neck mass or lymphadenopathy. Aortic arch: Obscured proximal great vessels due to dense left  subclavian and innominate vein contrast streak artifact. No aortic arch atherosclerosis is evident. Right carotid system: The right CCA origin is visible and normal. The right CCA, right carotid bifurcation and cervical right ICA are patent and appear negative. Left carotid system: The left CCA origin is obscured. The left CCA is normally enhancing at the level of the thyroid. The left carotid bifurcation and cervical left ICA are patent without stenosis. The left ICA appears non dominant relative to the right, see intracranial findings below. Vertebral arteries: The proximal right subclavian artery is patent, its origin is grossly normal. The right vertebral artery origin appears normal. The right vertebral artery is patent to the skull base. No stenosis is evident. Proximal left subclavian artery is patent. The left vertebral artery origin appears normal. The left vertebral artery is patent to the skull base. No stenosis is evident. CTA HEAD Posterior circulation: The distal vertebral arteries are codominant and appear normal to the vertebrobasilar junction. Patent left PICA origin. The right AICA may be dominant. Patent basilar artery, AICA origins the and SCA origins. The left PCA origin is normal while there is a fetal type right PCA origin. A small left posterior communicating artery is visible. The bilateral PCA branches are within normal limits. Anterior circulation: Both ICA siphons are patent and the right is dominant owing to a fetal type right PCA origin and dominant right ACA A1 segment with diminutive or absent left ACA A1. No siphon atherosclerosis or stenosis is identified. The posterior communicating artery origins are normal. The MCA and right ACA origins are normal. The anterior communicating artery is normal. The proximal A2 segments are mildly tortuous. The bilateral ACA branches are within normal limits. The right MCA M1 segment, bifurcation, and right MCA branches are within normal limits. The  left MCA M1 segment, left MCA bifurcation, and left MCA branches appear patent and within normal limits. Venous sinuses: Patent. Anatomic variants: Fetal type right PCA origin. Dominant right and diminutive or absent left ACA A1 segments. Review of the MIP images confirms the above findings IMPRESSION: 1. No acute cortically based infarct or acute intracranial hemorrhage identified by CT. Preliminary report of this was communicated to Dr. Alexis Goodell at 1601 hours on 5/9/2019by text page via the [facility] messaging system. 2. CTA is negative for large vessel occlusion. This result was communicated to Dr. Alexis Goodell at 1609 hours via the [facility] messaging system. 3. Stable noncontrast CT appearance of the brain since 2017, negative aside from chronic partially empty sella. 4. The great vessel origins are obscured by dense venous contrast at the left thoracic inlet. Otherwise no atherosclerosis or arterial stenosis is identified. Electronically Signed   By: Genevie Ann M.D.   On: 03/30/2018 16:13   Ct Angio Chest Pe W And/or Wo Contrast  Result Date: 03/29/2018 CLINICAL DATA:  Chest pain and shortness of breath EXAM: CT ANGIOGRAPHY CHEST WITH CONTRAST TECHNIQUE: Multidetector CT imaging of the chest was performed using the standard protocol during bolus administration of intravenous contrast. Multiplanar CT image reconstructions and MIPs were obtained to evaluate the vascular anatomy. CONTRAST:  4mL ISOVUE-370 IOPAMIDOL (ISOVUE-370) INJECTION 76% COMPARISON:  Chest radiograph 03/29/2018 FINDINGS: Cardiovascular: Contrast injection is sufficient to demonstrate satisfactory opacification of the pulmonary arteries to the segmental level. There is no pulmonary embolus. The main pulmonary artery is within normal limits for size. There is a normal 3-vessel arch branching pattern without evidence of acute aortic syndrome. There is noaortic atherosclerosis. Heart size is normal, without pericardial effusion.  Mediastinum/Nodes: No mediastinal, hilar or axillary lymphadenopathy. The visualized thyroid and thoracic esophageal course are unremarkable. Lungs/Pleura: No pulmonary nodules or masses. No pleural effusion or pneumothorax. No focal airspace consolidation. No focal pleural abnormality. Upper Abdomen: Contrast bolus timing is not optimized for evaluation of the abdominal organs. Hepatomegaly with hepatic steatosis. Musculoskeletal: No chest wall abnormality. No acute or significant osseous findings. Review of the MIP images confirms the above findings. IMPRESSION: 1. No pulmonary embolus or acute aortic syndrome. 2. Clear lungs. 3. Hepatomegaly and attic steatosis. Electronically Signed   By: Ulyses Jarred M.D.   On: 03/29/2018 17:19   US Venous Img Lower Bilateral  Result Date: 03/30/2018 CLINICAL DATA:  Bilateral lower extremity pain and edema. Elevated D-dimer. Evaluate for DVT. EXAM: BILATERAL LOWER EXTREMITY VENOUS DOPPLER ULTRASOUND TECHNIQUE: Gray-scale sonography with graded compression, as well as color Doppler and duplex ultrasound were performed to evaluate the lower extremity deep venous systems from the level of the common femoral vein and including the common femoral, femoral, profunda femoral, popliteal and calf veins including the posterior tibial, peroneal and gastrocnemius veins when visible. The superficial great saphenous vein was also interrogated. Spectral Doppler was utilized to evaluate flow at rest and with distal augmentation maneuvers in the common femoral, femoral and popliteal veins. COMPARISON:  Bilateral lower extremity venous Doppler ultrasound - 01/30/2018 FINDINGS: RIGHT LOWER EXTREMITY Common Femoral Vein: No evidence of thrombus. Normal compressibility, respiratory phasicity and response to augmentation. Saphenofemoral Junction: No evidence of thrombus. Normal compressibility and flow on color Doppler imaging. Profunda Femoral Vein: No evidence of thrombus. Normal  compressibility and flow on color Doppler imaging. Femoral Vein: No evidence of thrombus. Normal compressibility, respiratory phasicity and response to augmentation. Popliteal Vein: No evidence of thrombus. Normal compressibility, respiratory phasicity and response to augmentation. Calf Veins: No evidence of thrombus. Normal compressibility and flow on color Doppler imaging. Superficial Great Saphenous Vein: No evidence of thrombus. Normal compressibility. Venous Reflux:  None. Other Findings:  None. LEFT LOWER EXTREMITY Common Femoral Vein: No evidence of thrombus. Normal compressibility, respiratory phasicity and response to augmentation. Saphenofemoral Junction: No evidence of thrombus. Normal compressibility and flow on color Doppler imaging. Profunda Femoral Vein: No evidence of thrombus. Normal compressibility and flow on color Doppler imaging. Femoral Vein: No evidence of thrombus. Normal compressibility, respiratory phasicity and response to augmentation. Popliteal Vein: No evidence of thrombus. Normal compressibility, respiratory phasicity and response to augmentation. Calf Veins: No evidence of thrombus. Normal compressibility and flow on color Doppler imaging. Superficial Great Saphenous Vein: No evidence of thrombus. Normal compressibility. Venous Reflux:  None. Other Findings:  None. IMPRESSION: No evidence of DVT within either lower extremity Electronically Signed   By: Sandi Mariscal M.D.   On: 03/30/2018 09:59    ASSESSMENT / PLAN: Atypical Angina per cardiology no evidence of myocardial infarction  CVA vs. TIA  Hx: OSA, Renal Disorder, HTN, Diabetes Mellitus, and Asthma  P: Supplemental O2 for dyspnea and/or hypoxia Prn bronchodilator therapy   CPAP qhs  Continuous telemetry monitoring  Echo and Lexiscan pending  Cardiology consulted appreciate input Continue cardiac medications  Neurology consulted appreciate input  Allow for permissive hypertension  Neuro checks per code stroke  protocol  VTE px: subq heparin Trend CBC Monitor for s/sx of bleeding and transfuse for hgb <8 PT consult pending  SSI and lantus   Marda Stalker, [place] Pager [phone] (please enter 7 digits) [place] Pager [phone] (please enter 7 digits)   Pulmonary/critical care attending  I have personally seen and examined Mrs. Garnetta Buddy, reviewed, revised and confirm nurse practitioner's note. I independently performed history, physical, reviewed all laboratory and imaging studies.in short, patient presented for atypical chest pain and was seen by cardiology,troponins negative without any clear evidence of ischemic changes on EKG. Developed an episode of left-sided weakness, was seen by neurology, CT scan of the head did not reveal any large vessel occlusive disease pending MRI. This morning she states that the only symptom she has is some tingling in her left fingers otherwise her weakness has resolved and her chest pain has resolved. Pending MRI will discuss with neurology  Hermelinda Dellen, D.O.

## 2018-03-31 ENCOUNTER — Inpatient Hospital Stay: Payer: Medicaid Other

## 2018-03-31 ENCOUNTER — Inpatient Hospital Stay
Admit: 2018-03-31 | Discharge: 2018-03-31 | Disposition: A | Discharge status: Home / Self Care | Payer: Medicaid Other | Attending: Internal Medicine | Admitting: Internal Medicine

## 2018-03-31 DIAGNOSIS — R079 Chest pain, unspecified: Secondary | ICD-10-CM

## 2018-03-31 LAB — BASIC METABOLIC PANEL
Anion gap: 5 (ref 5–15)
BUN: 10 mg/dL (ref 6–20)
CHLORIDE: 103 mmol/L (ref 101–111)
CO2: 30 mmol/L (ref 22–32)
CREATININE: 0.76 mg/dL (ref 0.44–1.00)
Calcium: 8.1 mg/dL — ABNORMAL LOW (ref 8.9–10.3)
GFR calc Af Amer: >60 mL/min (ref >60)
GFR calc non Af Amer: >60 mL/min (ref >60)
Glucose, Bld: 116 mg/dL — ABNORMAL HIGH (ref 65–99)
Potassium: 3.5 mmol/L (ref 3.5–5.1)
Sodium: 138 mmol/L (ref 135–145)

## 2018-03-31 LAB — GLUCOSE, CAPILLARY
GLUCOSE-CAPILLARY: 154 mg/dL — AB (ref 65–99)
Glucose-Capillary: 106 mg/dL — ABNORMAL HIGH (ref 65–99)
Glucose-Capillary: 150 mg/dL — ABNORMAL HIGH (ref 65–99)
Glucose-Capillary: 138 mg/dL — ABNORMAL HIGH (ref 65–99)

## 2018-03-31 LAB — LIPID PANEL
CHOLESTEROL: 143 mg/dL (ref 0–200)
HDL: 30 mg/dL — ABNORMAL LOW (ref >40)
LDL Cholesterol: 93 mg/dL (ref 0–99)
Total CHOL/HDL Ratio: 4.8 RATIO
Triglycerides: 100 mg/dL (ref <150)
VLDL: 20 mg/dL (ref 0–40)

## 2018-03-31 LAB — ECHOCARDIOGRAM COMPLETE
Height: 66 in
Weight: 5366.4 oz

## 2018-03-31 LAB — MAGNESIUM: Magnesium: 1.9 mg/dL (ref 1.7–2.4)

## 2018-03-31 LAB — TSH: TSH: 3.509 u[IU]/mL (ref 0.350–4.500)

## 2018-03-31 MED ORDER — POTASSIUM CHLORIDE CRYS ER 20 MEQ PO TBCR
40.0000 meq | EXTENDED_RELEASE_TABLET | Freq: Once | ORAL | Status: AC
  Administered 2018-03-31: 40 meq via ORAL

## 2018-03-31 MED ORDER — INSULIN GLARGINE 100 UNIT/ML ~~LOC~~ SOLN
20.0000 [IU] | Freq: Every day | SUBCUTANEOUS | Status: DC
  Administered 2018-04-01: 20 [IU] via SUBCUTANEOUS
  Filled 2018-03-31: qty 0.2

## 2018-03-31 MED ORDER — TECHNETIUM TC 99M TETROFOSMIN IV KIT
31.3170 | PACK | Freq: Once | INTRAVENOUS | Status: AC | PRN
  Administered 2018-03-31: 31.317 via INTRAVENOUS

## 2018-03-31 NOTE — Progress Notes (Signed)
[facility] NAME: Brenda Hicks    MR#:  [id number]  DATE OF BIRTH:  03-22-1966  SUBJECTIVE:  CHIEF COMPLAINT: Patient is doing fine , no chest pain and left sided weakness resolved except for some tingling  REVIEW OF SYSTEMS:  CONSTITUTIONAL: No fever, fatigue or weakness.  EYES: No blurred or double vision.  EARS, NOSE, AND THROAT: No tinnitus or ear pain.  RESPIRATORY: No cough, shortness of breath, wheezing or hemoptysis.  CARDIOVASCULAR: No chest pain, orthopnea, edema.  GASTROINTESTINAL: No nausea, vomiting, diarrhea or abdominal pain.  GENITOURINARY: No dysuria, hematuria.  ENDOCRINE: No polyuria, nocturia,  HEMATOLOGY: No anemia, easy bruising or bleeding SKIN: No rash or lesion. MUSCULOSKELETAL: Chronic left shoulder pain is present  nEUROLOGIC: Reporting left-sided tingling and numbness in the left upper and lower extremities PSYCHIATRY: No anxiety or depression.   DRUG ALLERGIES:  No Known Allergies  VITALS:  Blood pressure (!) 141/86, pulse 73, temperature 97.9 F (36.6 C), temperature source Axillary, resp. rate 17, height 5\' 6"  (1.676 m), weight (!) 152.1 kg (335 lb 6.4 oz), SpO2 97 %.  PHYSICAL EXAMINATION:  GENERAL:  52 y.o.-year-old patient lying in the bed with no acute distress.  EYES: Pupils equal, round, reactive to light and accommodation. No scleral icterus. Extraocular muscles intact.  HEENT: Head atraumatic, normocephalic. Oropharynx and nasopharynx clear.  NECK:  Supple, no jugular venous distention. No thyroid enlargement, no tenderness.  LUNGS: Normal breath sounds bilaterally, no wheezing, rales,rhonchi or crepitation. No use of accessory muscles of respiration.  CARDIOVASCULAR: S1, S2 normal. No murmurs, rubs, or gallops.  ABDOMEN: Soft, nontender, nondistended. Bowel sounds present. No organomegaly or mass.  EXTREMITIES: No pedal edema, cyanosis, or clubbing.  NEUROLOGIC: Cranial nerves  II through XII are intact. Muscle strength 5/5 in  left upper and lower extremity and normal in right upper and lower extremities. Sensation intact. Gait not checked.  PSYCHIATRIC: The patient is alert and oriented x 3.  SKIN: No obvious rash, lesion, or ulcer.    LABORATORY PANEL:   CBC Recent Labs  Lab 03/30/18 0222  WBC 6.3  HGB 13.2  HCT 39.4  PLT 234   ------------------------------------------------------------------------------------------------------------------  Chemistries  Recent Labs  Lab 03/31/18 0539  NA 138  K 3.5  CL 103  CO2 30  GLUCOSE 116*  BUN 10  CREATININE 0.76  CALCIUM 8.1*  MG 1.9   ------------------------------------------------------------------------------------------------------------------  Cardiac Enzymes Recent Labs  Lab 03/30/18 0222  TROPONINI <0.03   ------------------------------------------------------------------------------------------------------------------  RADIOLOGY:  Ct Angio Head W Or Wo Contrast  Result Date: 03/30/2018 CLINICAL DATA:  52 year old female with weakness in the extremities beginning 1 hour ago. Code stroke. EXAM: CT ANGIOGRAPHY HEAD AND NECK TECHNIQUE: Multidetector CT imaging of the head and neck was performed using the standard protocol during bolus administration of intravenous contrast. Multiplanar CT image reconstructions and MIPs were obtained to evaluate the vascular anatomy. Carotid stenosis measurements (when applicable) are obtained utilizing NASCET criteria, using the distal internal carotid diameter as the denominator. CONTRAST:  20mL ISOVUE-370 IOPAMIDOL (ISOVUE-370) INJECTION 76% COMPARISON:  Head and face CTs 10/13/2016. Cervical spine MRI 05/10/2017. FINDINGS: CT HEAD FINDINGS Brain: Cerebral volume remains normal. No midline shift, ventriculomegaly, mass effect, evidence of mass lesion, or cortically based acute infarction. ASPECTS 10. Within the medial periventricular white matter of the  posterior left temporal lobe a new small 6 mm hyperdense area seen on series 13, image 13 on the reformatted images appears to reflect  stable normal choroid plexus density (compare sagittal image 36 today to sagittal image 39 in 2017). No acute intracranial hemorrhage identified. Gray-white matter differentiation remains normal. Chronic partially empty sella. Calvarium and skull base: Stable and negative. Paranasal sinuses: Stable and negative. Orbits: Stable and negative. CTA NECK Skeleton: No acute osseous abnormality identified. Upper chest: Negative lung apices. No superior mediastinal lymphadenopathy. Other neck: The glottis is closed. Large body habitus. Negative for neck mass or lymphadenopathy. Aortic arch: Obscured proximal great vessels due to dense left subclavian and innominate vein contrast streak artifact. No aortic arch atherosclerosis is evident. Right carotid system: The right CCA origin is visible and normal. The right CCA, right carotid bifurcation and cervical right ICA are patent and appear negative. Left carotid system: The left CCA origin is obscured. The left CCA is normally enhancing at the level of the thyroid. The left carotid bifurcation and cervical left ICA are patent without stenosis. The left ICA appears non dominant relative to the right, see intracranial findings below. Vertebral arteries: The proximal right subclavian artery is patent, its origin is grossly normal. The right vertebral artery origin appears normal. The right vertebral artery is patent to the skull base. No stenosis is evident. Proximal left subclavian artery is patent. The left vertebral artery origin appears normal. The left vertebral artery is patent to the skull base. No stenosis is evident. CTA HEAD Posterior circulation: The distal vertebral arteries are codominant and appear normal to the vertebrobasilar junction. Patent left PICA origin. The right AICA may be dominant. Patent basilar artery, AICA origins the  and SCA origins. The left PCA origin is normal while there is a fetal type right PCA origin. A small left posterior communicating artery is visible. The bilateral PCA branches are within normal limits. Anterior circulation: Both ICA siphons are patent and the right is dominant owing to a fetal type right PCA origin and dominant right ACA A1 segment with diminutive or absent left ACA A1. No siphon atherosclerosis or stenosis is identified. The posterior communicating artery origins are normal. The MCA and right ACA origins are normal. The anterior communicating artery is normal. The proximal A2 segments are mildly tortuous. The bilateral ACA branches are within normal limits. The right MCA M1 segment, bifurcation, and right MCA branches are within normal limits. The left MCA M1 segment, left MCA bifurcation, and left MCA branches appear patent and within normal limits. Venous sinuses: Patent. Anatomic variants: Fetal type right PCA origin. Dominant right and diminutive or absent left ACA A1 segments. Review of the MIP images confirms the above findings IMPRESSION: 1. No acute cortically based infarct or acute intracranial hemorrhage identified by CT. Preliminary report of this was communicated to Dr. Alexis Goodell at 1601 hours on 5/9/2019by text page via the [facility] messaging system. 2. CTA is negative for large vessel occlusion. This result was communicated to Dr. Alexis Goodell at 1609 hours via the [facility] messaging system. 3. Stable noncontrast CT appearance of the brain since 2017, negative aside from chronic partially empty sella. 4. The great vessel origins are obscured by dense venous contrast at the left thoracic inlet. Otherwise no atherosclerosis or arterial stenosis is identified. Electronically Signed   By: Genevie Ann M.D.   On: 03/30/2018 16:13   Ct Angio Neck W Or Wo Contrast  Result Date: 03/30/2018 CLINICAL DATA:  52 year old female with weakness in the extremities beginning 1 hour ago. Code stroke.  EXAM: CT ANGIOGRAPHY HEAD AND NECK TECHNIQUE: Multidetector CT imaging of the head and  neck was performed using the standard protocol during bolus administration of intravenous contrast. Multiplanar CT image reconstructions and MIPs were obtained to evaluate the vascular anatomy. Carotid stenosis measurements (when applicable) are obtained utilizing NASCET criteria, using the distal internal carotid diameter as the denominator. CONTRAST:  52mL ISOVUE-370 IOPAMIDOL (ISOVUE-370) INJECTION 76% COMPARISON:  Head and face CTs 10/13/2016. Cervical spine MRI 05/10/2017. FINDINGS: CT HEAD FINDINGS Brain: Cerebral volume remains normal. No midline shift, ventriculomegaly, mass effect, evidence of mass lesion, or cortically based acute infarction. ASPECTS 10. Within the medial periventricular white matter of the posterior left temporal lobe a new small 6 mm hyperdense area seen on series 13, image 13 on the reformatted images appears to reflect stable normal choroid plexus density (compare sagittal image 36 today to sagittal image 39 in 2017). No acute intracranial hemorrhage identified. Gray-white matter differentiation remains normal. Chronic partially empty sella. Calvarium and skull base: Stable and negative. Paranasal sinuses: Stable and negative. Orbits: Stable and negative. CTA NECK Skeleton: No acute osseous abnormality identified. Upper chest: Negative lung apices. No superior mediastinal lymphadenopathy. Other neck: The glottis is closed. Large body habitus. Negative for neck mass or lymphadenopathy. Aortic arch: Obscured proximal great vessels due to dense left subclavian and innominate vein contrast streak artifact. No aortic arch atherosclerosis is evident. Right carotid system: The right CCA origin is visible and normal. The right CCA, right carotid bifurcation and cervical right ICA are patent and appear negative. Left carotid system: The left CCA origin is obscured. The left CCA is normally enhancing at the  level of the thyroid. The left carotid bifurcation and cervical left ICA are patent without stenosis. The left ICA appears non dominant relative to the right, see intracranial findings below. Vertebral arteries: The proximal right subclavian artery is patent, its origin is grossly normal. The right vertebral artery origin appears normal. The right vertebral artery is patent to the skull base. No stenosis is evident. Proximal left subclavian artery is patent. The left vertebral artery origin appears normal. The left vertebral artery is patent to the skull base. No stenosis is evident. CTA HEAD Posterior circulation: The distal vertebral arteries are codominant and appear normal to the vertebrobasilar junction. Patent left PICA origin. The right AICA may be dominant. Patent basilar artery, AICA origins the and SCA origins. The left PCA origin is normal while there is a fetal type right PCA origin. A small left posterior communicating artery is visible. The bilateral PCA branches are within normal limits. Anterior circulation: Both ICA siphons are patent and the right is dominant owing to a fetal type right PCA origin and dominant right ACA A1 segment with diminutive or absent left ACA A1. No siphon atherosclerosis or stenosis is identified. The posterior communicating artery origins are normal. The MCA and right ACA origins are normal. The anterior communicating artery is normal. The proximal A2 segments are mildly tortuous. The bilateral ACA branches are within normal limits. The right MCA M1 segment, bifurcation, and right MCA branches are within normal limits. The left MCA M1 segment, left MCA bifurcation, and left MCA branches appear patent and within normal limits. Venous sinuses: Patent. Anatomic variants: Fetal type right PCA origin. Dominant right and diminutive or absent left ACA A1 segments. Review of the MIP images confirms the above findings IMPRESSION: 1. No acute cortically based infarct or acute  intracranial hemorrhage identified by CT. Preliminary report of this was communicated to Dr. Alexis Goodell at 1601 hours on 5/9/2019by text page via the [facility] messaging system.  2. CTA is negative for large vessel occlusion. This result was communicated to Dr. Alexis Goodell at 1609 hours via the [facility] messaging system. 3. Stable noncontrast CT appearance of the brain since 2017, negative aside from chronic partially empty sella. 4. The great vessel origins are obscured by dense venous contrast at the left thoracic inlet. Otherwise no atherosclerosis or arterial stenosis is identified. Electronically Signed   By: Genevie Ann M.D.   On: 03/30/2018 16:13   Ct Angio Chest Pe W And/or Wo Contrast  Result Date: 03/29/2018 CLINICAL DATA:  Chest pain and shortness of breath EXAM: CT ANGIOGRAPHY CHEST WITH CONTRAST TECHNIQUE: Multidetector CT imaging of the chest was performed using the standard protocol during bolus administration of intravenous contrast. Multiplanar CT image reconstructions and MIPs were obtained to evaluate the vascular anatomy. CONTRAST:  41mL ISOVUE-370 IOPAMIDOL (ISOVUE-370) INJECTION 76% COMPARISON:  Chest radiograph 03/29/2018 FINDINGS: Cardiovascular: Contrast injection is sufficient to demonstrate satisfactory opacification of the pulmonary arteries to the segmental level. There is no pulmonary embolus. The main pulmonary artery is within normal limits for size. There is a normal 3-vessel arch branching pattern without evidence of acute aortic syndrome. There is noaortic atherosclerosis. Heart size is normal, without pericardial effusion. Mediastinum/Nodes: No mediastinal, hilar or axillary lymphadenopathy. The visualized thyroid and thoracic esophageal course are unremarkable. Lungs/Pleura: No pulmonary nodules or masses. No pleural effusion or pneumothorax. No focal airspace consolidation. No focal pleural abnormality. Upper Abdomen: Contrast bolus timing is not optimized for evaluation of the  abdominal organs. Hepatomegaly with hepatic steatosis. Musculoskeletal: No chest wall abnormality. No acute or significant osseous findings. Review of the MIP images confirms the above findings. IMPRESSION: 1. No pulmonary embolus or acute aortic syndrome. 2. Clear lungs. 3. Hepatomegaly and attic steatosis. Electronically Signed   By: Ulyses Jarred M.D.   On: 03/29/2018 17:19   Mr Brain Wo Contrast  Result Date: 03/31/2018 CLINICAL DATA:  Left-sided weakness EXAM: MRI HEAD WITHOUT CONTRAST TECHNIQUE: Multiplanar, multiecho pulse sequences of the brain and surrounding structures were obtained without intravenous contrast. COMPARISON:  CT yesterday. FINDINGS: Brain: Brain has normal appearance without evidence of malformation, atrophy, old or acute small or large vessel infarction, mass lesion, hemorrhage, hydrocephalus or extra-axial collection. Vascular: Major vessels at the base of the brain show flow. Venous sinuses appear patent. Skull and upper cervical spine: Normal. Sinuses/Orbits: Clear/normal. Other: None significant. IMPRESSION: Normal examination. No abnormality seen to explain left-sided weakness. Electronically Signed   By: Nelson Chimes M.D.   On: 03/31/2018 14:04   Nm Myocar Multi W/spect W/wall Motion / Ef  Addendum Date: 03/31/2018   Addendum: Pharmacological myocardial perfusion imaging study with no significant ischemia Grossly normal perfusion with small region of decreased perfusion of mild intensity in the anteroapical region, suspect secondary to breast attenuation artifact Normal wall motion, EF estimated at 72% No EKG changes concerning for ischemia at peak stress or in recovery. Hypertensive during the test , systolic pressures 431 up to 190 Signed, Esmond Plants, MD, Ph.D [facility]   Result Date: 03/30/2018 Pharmacological myocardial perfusion imaging study Stress images obtained, resting images not available at this time Grossly normal perfusion with small region of decreased  perfusion of mild intensity in the anteroapical region Unable to rule out small region of ischemia in the anteroapical region as no resting images available This perfusion defect could be secondary to increased uptake in the inferoseptal wall (attenuation artifact) Normal wall motion, EF estimated at 72% No EKG changes concerning for ischemia at  peak stress or in recovery. Hypertensive during the test systolic pressures 510 up to 190 Signed, Esmond Plants, MD, Ph.D [facility]   US Venous Img Lower Bilateral  Result Date: 03/30/2018 CLINICAL DATA:  Bilateral lower extremity pain and edema. Elevated D-dimer. Evaluate for DVT. EXAM: BILATERAL LOWER EXTREMITY VENOUS DOPPLER ULTRASOUND TECHNIQUE: Gray-scale sonography with graded compression, as well as color Doppler and duplex ultrasound were performed to evaluate the lower extremity deep venous systems from the level of the common femoral vein and including the common femoral, femoral, profunda femoral, popliteal and calf veins including the posterior tibial, peroneal and gastrocnemius veins when visible. The superficial great saphenous vein was also interrogated. Spectral Doppler was utilized to evaluate flow at rest and with distal augmentation maneuvers in the common femoral, femoral and popliteal veins. COMPARISON:  Bilateral lower extremity venous Doppler ultrasound - 01/30/2018 FINDINGS: RIGHT LOWER EXTREMITY Common Femoral Vein: No evidence of thrombus. Normal compressibility, respiratory phasicity and response to augmentation. Saphenofemoral Junction: No evidence of thrombus. Normal compressibility and flow on color Doppler imaging. Profunda Femoral Vein: No evidence of thrombus. Normal compressibility and flow on color Doppler imaging. Femoral Vein: No evidence of thrombus. Normal compressibility, respiratory phasicity and response to augmentation. Popliteal Vein: No evidence of thrombus. Normal compressibility, respiratory phasicity and response to  augmentation. Calf Veins: No evidence of thrombus. Normal compressibility and flow on color Doppler imaging. Superficial Great Saphenous Vein: No evidence of thrombus. Normal compressibility. Venous Reflux:  None. Other Findings:  None. LEFT LOWER EXTREMITY Common Femoral Vein: No evidence of thrombus. Normal compressibility, respiratory phasicity and response to augmentation. Saphenofemoral Junction: No evidence of thrombus. Normal compressibility and flow on color Doppler imaging. Profunda Femoral Vein: No evidence of thrombus. Normal compressibility and flow on color Doppler imaging. Femoral Vein: No evidence of thrombus. Normal compressibility, respiratory phasicity and response to augmentation. Popliteal Vein: No evidence of thrombus. Normal compressibility, respiratory phasicity and response to augmentation. Calf Veins: No evidence of thrombus. Normal compressibility and flow on color Doppler imaging. Superficial Great Saphenous Vein: No evidence of thrombus. Normal compressibility. Venous Reflux:  None. Other Findings:  None. IMPRESSION: No evidence of DVT within either lower extremity Electronically Signed   By: Sandi Mariscal M.D.   On: 03/30/2018 09:59    EKG:   Orders placed or performed during the hospital encounter of 03/29/18  . EKG 12-Lead  . EKG 12-Lead  . ED EKG within 10 minutes  . ED EKG within 10 minutes  . EKG 12-Lead  . EKG 12-Lead    ASSESSMENT AND PLAN:     #Acute left-sided  weakness with headache In -patient code stroke activated Patient is not a candidate for TPA  Per Dr. Doy Mince   CTA head is negative CT angiogram of the neck is negative for large vessel occlusion Echocardiogram is pending MRI of the brain negative  neurochecks fasting lipid panel-DL 93 Hemoglobin A1c 11.8 Neurology is recommending to discharge patient with aspirin and statin at the time of discharge  #Chest pain Acute MI ruled out with negative troponins Patient had first part of the stress  test 03/30/2018, had a second part of the stress test today  Seen by cardiology Dr. Nehemiah Massed  #Elevated D-dimers at 3800 CT angiogram of the chest with no pulmonary embolism  bilateral lower extremity venous Dopplers-no DVT  # diabetes-uncontrolled hemoglobin A1c 11.8   Continue her home dose of Lantus and titrate the dose dose of NovoLog and keep on sliding scale coverage.   Will  hold her oral medications for now. Outpatient endocrinology follow-up  * hypertension   allow permissive hypertension in view of code stroke  * hyperlipidemia   Continue her statin for now.      All the records are reviewed and case discussed with Care Management/Social Workerr. Management plans discussed with the patient, family and they are in agreement.  CODE STATUS: fc , husband is hcpoa  TOTAL CRITICAL CARE TIME TAKING CARE OF THIS PATIENT: 39  minutes.   POSSIBLE D/C IN 2-3 DAYS, DEPENDING ON CLINICAL CONDITION.  Note: This dictation was prepared with Dragon dictation along with smaller phrase technology. Any transcriptional errors that result from this process are unintentional.   Nicholes Mango M.D on 03/31/2018 at 2:56 PM  Between 7am to 6pm - Pager - [phone] After 6pm go to [email or web address] - password [email or web address] [facility]  [place] [facility]  Office  [phone]  CC: Primary care physician; Lorelee Market, MD

## 2018-03-31 NOTE — Progress Notes (Signed)
PT Cancellation Note  Patient Details Name: Brenda Hicks MRN: [id number] DOB: 12-11-1965   Cancelled Treatment:    Reason Eval/Treat Not Completed: Patient at procedure or test/unavailable(Per RN pt currently off floor for stress test).  Will attempt to see pt again later today, schedule permitting.    Collie Siad PT, DPT 03/31/2018, 10:35 AM

## 2018-03-31 NOTE — Progress Notes (Signed)
Subjective: Patient reports feeling better today.  Still has some tingling in her left hand.  Vision and headache improved.    Objective: Current vital signs: BP (!) 141/86   Pulse 73   Temp 97.9 F (36.6 C) (Axillary)   Resp 17   Ht 5\' 6"  (1.676 m)   Wt (!) 152.1 kg (335 lb 6.4 oz)   SpO2 97%   BMI 54.13 kg/m  Vital signs in last 24 hours: Temp:  [97.9 F (36.6 C)-98.5 F (36.9 C)] 97.9 F (36.6 C) (05/10 0400) Pulse Rate:  [65-91] 73 (05/10 1111) Resp:  [11-19] 17 (05/10 0700) BP: (111-177)/(69-94) 141/86 (05/10 1111) SpO2:  [89 %-100 %] 97 % (05/10 0700)  Intake/Output from previous day: 05/09 0701 - 05/10 0700 In: 1050 [I.V.:1050] Out: 1850 [Urine:1850] Intake/Output this shift: Total I/O In: -  Out: 250 [Urine:250] Nutritional status:  Diet Order           Diet heart healthy/carb modified Room service appropriate? Yes; Fluid consistency: Thin  Diet effective now          Neurologic Exam: Mental Status: Alert, oriented, thought content appropriate.  Speech fluent without evidence of aphasia.  Able to follow 3 step commands without difficulty. Cranial Nerves: II: Discs flat bilaterally; Visual fields grossly normal, pupils equal, round, reactive to light and accommodation III,IV, VI: ptosis not present, extra-ocular motions intact bilaterally V,VII: decreased right NLF, facial light touch sensation normal bilaterally VIII: hearing normal bilaterally IX,X: gag reflex present XI: bilateral shoulder shrug XII: midline tongue extension Motor: Mild weakness continues in the lower extremities.  Continued decreased ROM at the shoulder in the LUE.   Sensory: Pinprick and light touch decreased in the right hand   Lab Results: Basic Metabolic Panel: Recent Labs  Lab 03/29/18 0945 03/30/18 0222 03/31/18 0539  NA 137 137 138  K 3.8 3.5 3.5  CL 99* 99* 103  CO2 30 32 30  GLUCOSE 229* 195* 116*  BUN 17 16 10   CREATININE 0.94 0.86 0.76  CALCIUM 8.6* 8.5*  8.1*  MG  --   --  1.9    Liver Function Tests: No results for input(s): AST, ALT, ALKPHOS, BILITOT, PROT, ALBUMIN in the last 168 hours. No results for input(s): LIPASE, AMYLASE in the last 168 hours. No results for input(s): AMMONIA in the last 168 hours.  CBC: Recent Labs  Lab 03/29/18 0945 03/30/18 0222  WBC 5.5 6.3  HGB 12.9 13.2  HCT 38.6 39.4  MCV 93.2 93.0  PLT 229 234    Cardiac Enzymes: Recent Labs  Lab 03/29/18 0945 03/29/18 1347 03/29/18 1836 03/29/18 2112 03/30/18 0222  TROPONINI <0.03 <0.03 <0.03 <0.03 <0.03    Lipid Panel: Recent Labs  Lab 03/29/18 1836 03/31/18 0539  CHOL 174 143  TRIG 112 100  HDL 36* 30*  CHOLHDL 4.8 4.8  VLDL 22 20  LDLCALC 116* 93    CBG: Recent Labs  Lab 03/30/18 1240 03/30/18 1508 03/30/18 1620 03/30/18 2213 03/31/18 0723  GLUCAP 91 225* 195* 155* 106*    Microbiology: Results for orders placed or performed during the hospital encounter of 03/29/18  MRSA PCR Screening     Status: None   Collection Time: 03/30/18  1:48 PM  Result Value Ref Range Status   MRSA by PCR NEGATIVE NEGATIVE Final    Comment:        The GeneXpert MRSA Assay (FDA approved for NASAL specimens only), is one component of a comprehensive MRSA colonization surveillance  program. It is not intended to diagnose MRSA infection nor to guide or monitor treatment for MRSA infections. Performed at [facility], [address] [place], [place] [id number]     Coagulation Studies: No results for input(s): LABPROT, INR in the last 72 hours.  Imaging: Ct Angio Head W Or Wo Contrast  Result Date: 03/30/2018 CLINICAL DATA:  52 year old female with weakness in the extremities beginning 1 hour ago. Code stroke. EXAM: CT ANGIOGRAPHY HEAD AND NECK TECHNIQUE: Multidetector CT imaging of the head and neck was performed using the standard protocol during bolus administration of intravenous contrast. Multiplanar CT image reconstructions and  MIPs were obtained to evaluate the vascular anatomy. Carotid stenosis measurements (when applicable) are obtained utilizing NASCET criteria, using the distal internal carotid diameter as the denominator. CONTRAST:  58mL ISOVUE-370 IOPAMIDOL (ISOVUE-370) INJECTION 76% COMPARISON:  Head and face CTs 10/13/2016. Cervical spine MRI 05/10/2017. FINDINGS: CT HEAD FINDINGS Brain: Cerebral volume remains normal. No midline shift, ventriculomegaly, mass effect, evidence of mass lesion, or cortically based acute infarction. ASPECTS 10. Within the medial periventricular white matter of the posterior left temporal lobe a new small 6 mm hyperdense area seen on series 13, image 13 on the reformatted images appears to reflect stable normal choroid plexus density (compare sagittal image 36 today to sagittal image 39 in 2017). No acute intracranial hemorrhage identified. Gray-white matter differentiation remains normal. Chronic partially empty sella. Calvarium and skull base: Stable and negative. Paranasal sinuses: Stable and negative. Orbits: Stable and negative. CTA NECK Skeleton: No acute osseous abnormality identified. Upper chest: Negative lung apices. No superior mediastinal lymphadenopathy. Other neck: The glottis is closed. Large body habitus. Negative for neck mass or lymphadenopathy. Aortic arch: Obscured proximal great vessels due to dense left subclavian and innominate vein contrast streak artifact. No aortic arch atherosclerosis is evident. Right carotid system: The right CCA origin is visible and normal. The right CCA, right carotid bifurcation and cervical right ICA are patent and appear negative. Left carotid system: The left CCA origin is obscured. The left CCA is normally enhancing at the level of the thyroid. The left carotid bifurcation and cervical left ICA are patent without stenosis. The left ICA appears non dominant relative to the right, see intracranial findings below. Vertebral arteries: The proximal right  subclavian artery is patent, its origin is grossly normal. The right vertebral artery origin appears normal. The right vertebral artery is patent to the skull base. No stenosis is evident. Proximal left subclavian artery is patent. The left vertebral artery origin appears normal. The left vertebral artery is patent to the skull base. No stenosis is evident. CTA HEAD Posterior circulation: The distal vertebral arteries are codominant and appear normal to the vertebrobasilar junction. Patent left PICA origin. The right AICA may be dominant. Patent basilar artery, AICA origins the and SCA origins. The left PCA origin is normal while there is a fetal type right PCA origin. A small left posterior communicating artery is visible. The bilateral PCA branches are within normal limits. Anterior circulation: Both ICA siphons are patent and the right is dominant owing to a fetal type right PCA origin and dominant right ACA A1 segment with diminutive or absent left ACA A1. No siphon atherosclerosis or stenosis is identified. The posterior communicating artery origins are normal. The MCA and right ACA origins are normal. The anterior communicating artery is normal. The proximal A2 segments are mildly tortuous. The bilateral ACA branches are within normal limits. The right MCA M1 segment, bifurcation, and  right MCA branches are within normal limits. The left MCA M1 segment, left MCA bifurcation, and left MCA branches appear patent and within normal limits. Venous sinuses: Patent. Anatomic variants: Fetal type right PCA origin. Dominant right and diminutive or absent left ACA A1 segments. Review of the MIP images confirms the above findings IMPRESSION: 1. No acute cortically based infarct or acute intracranial hemorrhage identified by CT. Preliminary report of this was communicated to Dr. Alexis Goodell at 1601 hours on 5/9/2019by text page via the [facility] messaging system. 2. CTA is negative for large vessel occlusion. This result  was communicated to Dr. Alexis Goodell at 1609 hours via the [facility] messaging system. 3. Stable noncontrast CT appearance of the brain since 2017, negative aside from chronic partially empty sella. 4. The great vessel origins are obscured by dense venous contrast at the left thoracic inlet. Otherwise no atherosclerosis or arterial stenosis is identified. Electronically Signed   By: Genevie Ann M.D.   On: 03/30/2018 16:13   Ct Angio Neck W Or Wo Contrast  Result Date: 03/30/2018 CLINICAL DATA:  52 year old female with weakness in the extremities beginning 1 hour ago. Code stroke. EXAM: CT ANGIOGRAPHY HEAD AND NECK TECHNIQUE: Multidetector CT imaging of the head and neck was performed using the standard protocol during bolus administration of intravenous contrast. Multiplanar CT image reconstructions and MIPs were obtained to evaluate the vascular anatomy. Carotid stenosis measurements (when applicable) are obtained utilizing NASCET criteria, using the distal internal carotid diameter as the denominator. CONTRAST:  58mL ISOVUE-370 IOPAMIDOL (ISOVUE-370) INJECTION 76% COMPARISON:  Head and face CTs 10/13/2016. Cervical spine MRI 05/10/2017. FINDINGS: CT HEAD FINDINGS Brain: Cerebral volume remains normal. No midline shift, ventriculomegaly, mass effect, evidence of mass lesion, or cortically based acute infarction. ASPECTS 10. Within the medial periventricular white matter of the posterior left temporal lobe a new small 6 mm hyperdense area seen on series 13, image 13 on the reformatted images appears to reflect stable normal choroid plexus density (compare sagittal image 36 today to sagittal image 39 in 2017). No acute intracranial hemorrhage identified. Gray-white matter differentiation remains normal. Chronic partially empty sella. Calvarium and skull base: Stable and negative. Paranasal sinuses: Stable and negative. Orbits: Stable and negative. CTA NECK Skeleton: No acute osseous abnormality identified. Upper chest:  Negative lung apices. No superior mediastinal lymphadenopathy. Other neck: The glottis is closed. Large body habitus. Negative for neck mass or lymphadenopathy. Aortic arch: Obscured proximal great vessels due to dense left subclavian and innominate vein contrast streak artifact. No aortic arch atherosclerosis is evident. Right carotid system: The right CCA origin is visible and normal. The right CCA, right carotid bifurcation and cervical right ICA are patent and appear negative. Left carotid system: The left CCA origin is obscured. The left CCA is normally enhancing at the level of the thyroid. The left carotid bifurcation and cervical left ICA are patent without stenosis. The left ICA appears non dominant relative to the right, see intracranial findings below. Vertebral arteries: The proximal right subclavian artery is patent, its origin is grossly normal. The right vertebral artery origin appears normal. The right vertebral artery is patent to the skull base. No stenosis is evident. Proximal left subclavian artery is patent. The left vertebral artery origin appears normal. The left vertebral artery is patent to the skull base. No stenosis is evident. CTA HEAD Posterior circulation: The distal vertebral arteries are codominant and appear normal to the vertebrobasilar junction. Patent left PICA origin. The right AICA may be dominant. Patent  basilar artery, AICA origins the and SCA origins. The left PCA origin is normal while there is a fetal type right PCA origin. A small left posterior communicating artery is visible. The bilateral PCA branches are within normal limits. Anterior circulation: Both ICA siphons are patent and the right is dominant owing to a fetal type right PCA origin and dominant right ACA A1 segment with diminutive or absent left ACA A1. No siphon atherosclerosis or stenosis is identified. The posterior communicating artery origins are normal. The MCA and right ACA origins are normal. The  anterior communicating artery is normal. The proximal A2 segments are mildly tortuous. The bilateral ACA branches are within normal limits. The right MCA M1 segment, bifurcation, and right MCA branches are within normal limits. The left MCA M1 segment, left MCA bifurcation, and left MCA branches appear patent and within normal limits. Venous sinuses: Patent. Anatomic variants: Fetal type right PCA origin. Dominant right and diminutive or absent left ACA A1 segments. Review of the MIP images confirms the above findings IMPRESSION: 1. No acute cortically based infarct or acute intracranial hemorrhage identified by CT. Preliminary report of this was communicated to Dr. Alexis Goodell at 1601 hours on 5/9/2019by text page via the [facility] messaging system. 2. CTA is negative for large vessel occlusion. This result was communicated to Dr. Alexis Goodell at 1609 hours via the [facility] messaging system. 3. Stable noncontrast CT appearance of the brain since 2017, negative aside from chronic partially empty sella. 4. The great vessel origins are obscured by dense venous contrast at the left thoracic inlet. Otherwise no atherosclerosis or arterial stenosis is identified. Electronically Signed   By: Genevie Ann M.D.   On: 03/30/2018 16:13   Ct Angio Chest Pe W And/or Wo Contrast  Result Date: 03/29/2018 CLINICAL DATA:  Chest pain and shortness of breath EXAM: CT ANGIOGRAPHY CHEST WITH CONTRAST TECHNIQUE: Multidetector CT imaging of the chest was performed using the standard protocol during bolus administration of intravenous contrast. Multiplanar CT image reconstructions and MIPs were obtained to evaluate the vascular anatomy. CONTRAST:  70mL ISOVUE-370 IOPAMIDOL (ISOVUE-370) INJECTION 76% COMPARISON:  Chest radiograph 03/29/2018 FINDINGS: Cardiovascular: Contrast injection is sufficient to demonstrate satisfactory opacification of the pulmonary arteries to the segmental level. There is no pulmonary embolus. The main pulmonary  artery is within normal limits for size. There is a normal 3-vessel arch branching pattern without evidence of acute aortic syndrome. There is noaortic atherosclerosis. Heart size is normal, without pericardial effusion. Mediastinum/Nodes: No mediastinal, hilar or axillary lymphadenopathy. The visualized thyroid and thoracic esophageal course are unremarkable. Lungs/Pleura: No pulmonary nodules or masses. No pleural effusion or pneumothorax. No focal airspace consolidation. No focal pleural abnormality. Upper Abdomen: Contrast bolus timing is not optimized for evaluation of the abdominal organs. Hepatomegaly with hepatic steatosis. Musculoskeletal: No chest wall abnormality. No acute or significant osseous findings. Review of the MIP images confirms the above findings. IMPRESSION: 1. No pulmonary embolus or acute aortic syndrome. 2. Clear lungs. 3. Hepatomegaly and attic steatosis. Electronically Signed   By: Ulyses Jarred M.D.   On: 03/29/2018 17:19   Nm Myocar Multi W/spect W/wall Motion / Ef  Result Date: 03/30/2018 Pharmacological myocardial perfusion imaging study Stress images obtained, resting images not available at this time Grossly normal perfusion with small region of decreased perfusion of mild intensity in the anteroapical region Unable to rule out small region of ischemia in the anteroapical region as no resting images available This perfusion defect could be secondary to increased uptake  in the inferoseptal wall (attenuation artifact) Normal wall motion, EF estimated at 72% No EKG changes concerning for ischemia at peak stress or in recovery. Hypertensive during the test systolic pressures 144 up to 190 Signed, Esmond Plants, MD, Ph.D [facility]   US Venous Img Lower Bilateral  Result Date: 03/30/2018 CLINICAL DATA:  Bilateral lower extremity pain and edema. Elevated D-dimer. Evaluate for DVT. EXAM: BILATERAL LOWER EXTREMITY VENOUS DOPPLER ULTRASOUND TECHNIQUE: Gray-scale sonography with graded  compression, as well as color Doppler and duplex ultrasound were performed to evaluate the lower extremity deep venous systems from the level of the common femoral vein and including the common femoral, femoral, profunda femoral, popliteal and calf veins including the posterior tibial, peroneal and gastrocnemius veins when visible. The superficial great saphenous vein was also interrogated. Spectral Doppler was utilized to evaluate flow at rest and with distal augmentation maneuvers in the common femoral, femoral and popliteal veins. COMPARISON:  Bilateral lower extremity venous Doppler ultrasound - 01/30/2018 FINDINGS: RIGHT LOWER EXTREMITY Common Femoral Vein: No evidence of thrombus. Normal compressibility, respiratory phasicity and response to augmentation. Saphenofemoral Junction: No evidence of thrombus. Normal compressibility and flow on color Doppler imaging. Profunda Femoral Vein: No evidence of thrombus. Normal compressibility and flow on color Doppler imaging. Femoral Vein: No evidence of thrombus. Normal compressibility, respiratory phasicity and response to augmentation. Popliteal Vein: No evidence of thrombus. Normal compressibility, respiratory phasicity and response to augmentation. Calf Veins: No evidence of thrombus. Normal compressibility and flow on color Doppler imaging. Superficial Great Saphenous Vein: No evidence of thrombus. Normal compressibility. Venous Reflux:  None. Other Findings:  None. LEFT LOWER EXTREMITY Common Femoral Vein: No evidence of thrombus. Normal compressibility, respiratory phasicity and response to augmentation. Saphenofemoral Junction: No evidence of thrombus. Normal compressibility and flow on color Doppler imaging. Profunda Femoral Vein: No evidence of thrombus. Normal compressibility and flow on color Doppler imaging. Femoral Vein: No evidence of thrombus. Normal compressibility, respiratory phasicity and response to augmentation. Popliteal Vein: No evidence of  thrombus. Normal compressibility, respiratory phasicity and response to augmentation. Calf Veins: No evidence of thrombus. Normal compressibility and flow on color Doppler imaging. Superficial Great Saphenous Vein: No evidence of thrombus. Normal compressibility. Venous Reflux:  None. Other Findings:  None. IMPRESSION: No evidence of DVT within either lower extremity Electronically Signed   By: Sandi Mariscal M.D.   On: 03/30/2018 09:59    Medications:  I have reviewed the patient's current medications. Scheduled: . aspirin EC  81 mg Oral Daily  . atorvastatin  40 mg Oral Daily  . furosemide  40 mg Oral Daily  . gabapentin  300 mg Oral TID  . heparin  5,000 Units Subcutaneous Q8H  . insulin aspart  0-9 Units Subcutaneous TID WC  . insulin aspart  10 Units Subcutaneous TID WC  . insulin glargine  100 Units Subcutaneous QHS  . lisinopril  40 mg Oral QPM  . living well with diabetes book   Does not apply Once  . meloxicam  7.5 mg Oral BID  . metoprolol tartrate  12.5 mg Oral BID  . potassium chloride  40 mEq Oral Once  . sodium chloride flush  3 mL Intravenous Q12H  . traZODone  50 mg Oral QHS    Assessment/Plan: Patient feels better today.  Continued right hand tingling.  MRI of the brain and echocardiogram pending.  Patient on ASA and statin.    Recommendations: 1.  Continue ASA and statin.   LOS: 1 day   Magda Paganini  Doy Mince, MD Neurology [phone] 03/31/2018  12:26 PM

## 2018-03-31 NOTE — Evaluation (Signed)
Physical Therapy Evaluation Patient Details Name: Brenda Hicks MRN: [id number] DOB: [birth year] Today's Date: 03/31/2018   History of Present Illness  Pt is a 52 y/o F who presented with intermittent chest pain radiating to her back.  Pt received nitroglycerin which helped relieve her pain.  Acute MI ruled out.  Pt s/p stress test in hospital. Two troponins and EKG were negative in ED. CT angiogram of chest neg for PE, BLE dopplers negative for DVT.  On 5/9 the pt developed LUE and LLE weakness associated with a headache.   Code stroke activated and pt transferred to the ICU. CT head negative.  Pending MRI.  Pt's PMH includes L RTC repair, R TKA.      Clinical Impression  Pt admitted with above diagnosis. Ms. Mulcahey presents at her baseline level of mobility.  She is independent with bed mobility, transfers, and ambulation with no signs of instability.  Pt presents with WNL BLE and BUE strength (with the exception of L shoulder F and Abd due to chronic pain s/p L RTC repair).  Pt reports that her symptoms from her episode on 5/9 have resolved except decreased sensation and tingling in L fingers and plantar surface of L foot.  Pt's decreased sensation to light touch throughout LUE and LLE was confirmed with sensation testing this session.  Educated pt on the importance of checking her legs/feet (especially the bottom of her feet) and her L hand/fingertips each day for possible injury since her sensation is impaired.  Additionally cautioned patient to be careful around the stovetop due to tingling and decreased sensation in L hand/fingertips.  Educated pt on the signs and symptoms of a stroke and to call 911 in the future should she notice any of these signs/symptoms.  Pt verbalized understanding. 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  Pt reports she has not seen a PT for her R knee pain and thus recommending OPPT at d/c to specifically address her chronic R knee pain.  No additional PT  needs identified and thus PT will be signing off.      Follow Up Recommendations Outpatient PT(to address chronic R knee pain)    Equipment Recommendations  None recommended by PT    Recommendations for Other Services       Precautions / Restrictions Precautions Precautions: None Restrictions Weight Bearing Restrictions: No      Mobility  Bed Mobility Overal bed mobility: Independent             General bed mobility comments: No physical assist or cues needed.  Pt performs independently.   Transfers Overall transfer level: Independent Equipment used: None             General transfer comment: Pt performs safely and without signs of instability.  Pt performs independently.   Ambulation/Gait Ambulation/Gait assistance: Independent Ambulation Distance (Feet): 120 Feet Assistive device: None Gait Pattern/deviations: WFL(Within Functional Limits)     General Gait Details: No gait abnormalities appreciated.  Pt reports 5/10 R knee pain which is her baseline since her R TKA ~10 years prior.  VSS.  No signs of instability.   Stairs            Wheelchair Mobility    Modified Rankin (Stroke Patients Only)       Balance Overall balance assessment: Independent  Pertinent Vitals/Pain Pain Assessment: 0-10 Pain Score: 10-Worst pain ever Pain Location: 10/10 L shoulder pain (pt reports h/o L RTC surgery on L side), R knee pain 5/10 when ambulating Pain Descriptors / Indicators: Aching Pain Intervention(s): Limited activity within patient's tolerance;Monitored during session;Repositioned    Home Living Family/patient expects to be discharged to:: Private residence Living Arrangements: Spouse/significant other;Children Available Help at Discharge: Family;Available 24 hours/day Type of Home: Apartment Home Access: Stairs to enter Entrance Stairs-Rails: (post to hold onto) Entrance Stairs-Number  of Steps: 2 Home Layout: One level Home Equipment: None      Prior Function Level of Independence: Independent         Comments: Pt ambulates without AD and denies any falls in the past 6 months.  Pt's ambulatory distance limited by R knee pain at times (h/o R TKA ~10 years ago).       Hand Dominance   Dominant Hand: Right    Extremity/Trunk Assessment   Upper Extremity Assessment Upper Extremity Assessment: LUE deficits/detail;RUE deficits/detail RUE Deficits / Details: Strength 5/5 throughout LUE Deficits / Details: L shoulder F and Abd MMT limited due to pain.  Otherwise, strength 5/5 throughout. LUE Sensation: decreased light touch(diminished throughout LUE from neck to fingers) LUE Coordination: WNL    Lower Extremity Assessment Lower Extremity Assessment: LLE deficits/detail LLE Deficits / Details: Strength 5/5 throughout LLE Sensation: decreased light touch(diminished light touch throughout LLE) LLE Coordination: WNL    Cervical / Trunk Assessment Cervical / Trunk Assessment: Normal  Communication   Communication: No difficulties  Cognition Arousal/Alertness: Awake/alert Behavior During Therapy: WFL for tasks assessed/performed Overall Cognitive Status: Within Functional Limits for tasks assessed                                        General Comments General comments (skin integrity, edema, etc.): Pt reports she has received OPPT for her L shoulder pain but has not had PT for her R knee pain.  Encouraged pt to trial OPPT for R knee pain.     Exercises General Exercises - Lower Extremity Straight Leg Raises: Both;10 reps;Supine   Assessment/Plan    PT Assessment All further PT needs can be met in the next venue of care  PT Problem List Impaired sensation       PT Treatment Interventions Gait training    PT Goals (Current goals can be found in the Care Plan section)  Acute Rehab PT Goals Patient Stated Goal: to return home PT Goal  Formulation: All assessment and education complete, DC therapy    Frequency     Barriers to discharge        Co-evaluation               AM-PAC PT "6 Clicks" Daily Activity  Outcome Measure Difficulty turning over in bed (including adjusting bedclothes, sheets and blankets)?: None Difficulty moving from lying on back to sitting on the side of the bed? : None Difficulty sitting down on and standing up from a chair with arms (e.g., wheelchair, bedside commode, etc,.)?: None Help needed moving to and from a bed to chair (including a wheelchair)?: None Help needed walking in hospital room?: None Help needed climbing 3-5 steps with a railing? : None 6 Click Score: 24    End of Session Equipment Utilized During Treatment: Gait belt Activity Tolerance: Patient tolerated treatment well Patient left: in chair;with call bell/phone  within reach Nurse Communication: Mobility status;Other (comment)(VSS) PT Visit Diagnosis: Muscle weakness (generalized) (M62.81)    Time: 2493-2419 PT Time Calculation (min) (ACUTE ONLY): 31 min   Charges:   PT Evaluation $PT Eval Low Complexity: 1 Low PT Treatments $Gait Training: 8-22 mins   PT G Codes:        Collie Siad PT, DPT 03/31/2018, 1:20 PM

## 2018-03-31 NOTE — Progress Notes (Signed)
Pharmacy Electrolyte Monitoring Consult:  Pharmacy consulted to assist in monitoring and replacing electrolytes in this 52 y.o. female admitted on 03/29/2018 with Chest Pain  Patient ordered furosemide 40mg  PO Daily and NS @ 17mL/hr.   Labs:  Sodium (mmol/L)  Date Value  03/31/2018 138  05/10/2014 132 (L)   Potassium (mmol/L)  Date Value  03/31/2018 3.5  05/10/2014 4.2   Calcium (mg/dL)  Date Value  03/31/2018 8.1 (L)   Calcium, Total (mg/dL)  Date Value  05/10/2014 9.0   Albumin (g/dL)  Date Value  01/30/2018 4.2  05/04/2014 3.5    Assessment/Plan:  Per AM ICU rounds, will discontinue NS. Will order potassium 50mEq PO x 1.   Goal magnesium ~ 2 and goal potassium ~ 4.   Will recheck electrolytes with am labs.   Pharmacy will continue to monitor and adjust per consult.   Naleah Kofoed L 03/31/2018 11:19 AM

## 2018-03-31 NOTE — Progress Notes (Signed)
Report called to [place], RN on [place]. Patient transported via wheelchair to room 215 by this RN. Patient's belongings were transported by the patient and her husband.

## 2018-03-31 NOTE — Progress Notes (Signed)
*  PRELIMINARY RESULTS* Echocardiogram 2D Echocardiogram has been performed.  Wallie Char Doria Fern 03/31/2018, 9:10 AM

## 2018-04-01 LAB — GLUCOSE, CAPILLARY
GLUCOSE-CAPILLARY: 155 mg/dL — AB (ref 65–99)
Glucose-Capillary: 165 mg/dL — ABNORMAL HIGH (ref 65–99)

## 2018-04-01 LAB — POTASSIUM: Potassium: 4.4 mmol/L (ref 3.5–5.1)

## 2018-04-01 LAB — MAGNESIUM: MAGNESIUM: 1.9 mg/dL (ref 1.7–2.4)

## 2018-04-01 MED ORDER — MAGNESIUM OXIDE 400 (241.3 MG) MG PO TABS: 400.0000 mg | ORAL_TABLET | Freq: Two times a day (BID) | ORAL | 0 refills | Status: AC

## 2018-04-01 MED ORDER — LIVING WELL WITH DIABETES BOOK: 1.0000 | Freq: Once | Status: AC

## 2018-04-01 MED ORDER — INSULIN GLARGINE 100 UNIT/ML ~~LOC~~ SOLN: 20.0000 [IU] | Freq: Every day | SUBCUTANEOUS | 11 refills | Status: DC

## 2018-04-01 MED ORDER — MAGNESIUM OXIDE 400 (241.3 MG) MG PO TABS
400.0000 mg | ORAL_TABLET | Freq: Two times a day (BID) | ORAL | Status: DC
  Administered 2018-04-01: 400 mg via ORAL
  Filled 2018-04-01: qty 1

## 2018-04-01 MED ORDER — ATORVASTATIN CALCIUM 40 MG PO TABS: 40.0000 mg | ORAL_TABLET | Freq: Every day | ORAL | 1 refills | Status: DC

## 2018-04-01 MED ORDER — INSULIN ASPART 100 UNIT/ML ~~LOC~~ SOLN: 10.0000 [IU] | Freq: Three times a day (TID) | SUBCUTANEOUS | 11 refills | Status: DC

## 2018-04-01 MED ORDER — DOCUSATE SODIUM 100 MG PO CAPS: 100.0000 mg | ORAL_CAPSULE | Freq: Two times a day (BID) | ORAL | 0 refills | Status: DC | PRN

## 2018-04-01 NOTE — Progress Notes (Signed)
Pharmacy Electrolyte Monitoring Consult:  Pharmacy consulted to assist in monitoring and replacing electrolytes in this 52 y.o. female admitted on 03/29/2018 with Chest Pain  Patient ordered furosemide 40mg  PO Daily and NS @ 157mL/hr.   Labs:  Sodium (mmol/L)  Date Value  03/31/2018 138  05/10/2014 132 (L)   Potassium (mmol/L)  Date Value  04/01/2018 4.4  05/10/2014 4.2   Magnesium (mg/dL)  Date Value  04/01/2018 1.9   Calcium (mg/dL)  Date Value  03/31/2018 8.1 (L)   Calcium, Total (mg/dL)  Date Value  05/10/2014 9.0   Albumin (g/dL)  Date Value  01/30/2018 4.2  05/04/2014 3.5    Assessment/Plan: Goal magnesium ~ 2 and goal potassium ~ 4.  Patient receiving Lasix 40mg  QD.   Will order MagOx 400mg  BID x 2 doses.   Will recheck electrolytes with AM labs.   Pharmacy will continue to monitor and adjust per consult.   Pernell Dupre, PharmD, BCPS Clinical Pharmacist 04/01/2018 10:37 AM

## 2018-04-01 NOTE — Discharge Summary (Signed)
[facility] NAME: Brenda Hicks    MR#:  [id number]  DATE OF BIRTH:  01-11-1966  DATE OF ADMISSION:  03/29/2018 ADMITTING PHYSICIAN: Nicholes Mango, MD  DATE OF DISCHARGE:  04/01/18  PRIMARY CARE PHYSICIAN: Lorelee Market, MD    ADMISSION DIAGNOSIS:  Exertional chest pain [R07.9] Stroke (cerebrum) ([place]) [I63.9]  DISCHARGE DIAGNOSIS:  Chest pain TIA  SECONDARY DIAGNOSIS:   Past Medical History:  Diagnosis Date  . Arthritis   . Asthma    WELL CONTROLLED  . Cancer of ear   . Diabetes mellitus without complication ([place])   . Fatty liver   . Hypertension   . Kidney cysts   . Renal disorder   . Sleep apnea    USES CPAP    HOSPITAL COURSE:   HISTORY OF PRESENT ILLNESS: Brenda Hicks  is a 52 y.o. female with a known history of DM, Htn, HLD, Obesity, Smoking in past- came to emergency room after having intermittent chest pain for last 2-3 days. The pain was in her center of the chest radiating to her back, getting worse with any kind of activities and some positions. It was not associated with any cough or shortness of breath. The pain was pressure-like as per her description. There are no relieving factors for her pain at home. She came to emergency room and received nitroglycerin tablet under the tongue and it helped to relieve her pain. Her 2 troponins and EKG were negative in emergency room but because of her typical pain history and being a high-risk for coronary artery disease, ER physician suggested to do further cardiac workup.   #Acute left-sided  weakness with headache In -patient code stroke activated Patient is not a candidate for TPA  Per Dr. Doy Mince   CTA head is negative CT angiogram of the neck is negative for large vessel occlusion Echocardiogram is normal with left ventricle ejection fraction 60 to 65% MRI of the brain negative  neurochecks performed during the hospital course fasting lipid  panel-DL 93 Hemoglobin A1c 11.8 Neurology is recommending to discharge patient with aspirin and statin at the time of discharge Seen by PT recommending outpatient physical therapy  #Chest pain Acute MI ruled out with negative troponins Patient had 2-day stress test which is normal  seen by cardiology Dr. Nehemiah Massed  #Elevated D-dimers at 3800 CT angiogram of the chest with no pulmonary embolism  bilateral lower extremity venous Dopplers-no DVT  # diabetes-uncontrolled hemoglobin A1c 11.8 Continue Lantus dose reduced to 20 units and NovoLog and oral home medication metformin and sitagliptin Outpatient endocrinology follow-up  * hypertension Needs good  control of the blood pressure  * hyperlipidemia Continue her statin for now.   DISCHARGE CONDITIONS:   Stable  CONSULTS OBTAINED:  Treatment Team:  Corey Skains, MD Catarina Hartshorn, MD Alexis Goodell, MD Flora Lipps, MD   PROCEDURES cardiac stress test Myoview normal  DRUG ALLERGIES:  No Known Allergies  DISCHARGE MEDICATIONS:   Allergies as of 04/01/2018   No Known Allergies     Medication List    STOP taking these medications   doxycycline 100 MG tablet Commonly known as:  VIBRA-TABS   HYDROcodone-acetaminophen 5-325 MG tablet Commonly known as:  NORCO/VICODIN   insulin lispro 100 UNIT/ML injection Commonly known as:  HUMALOG Replaced by:  insulin aspart 100 UNIT/ML injection   meloxicam 7.5 MG tablet Commonly known as:  MOBIC   simvastatin 20 MG tablet Commonly known as:  ZOCOR     TAKE these medications   albuterol 108 (90 Base) MCG/ACT inhaler Commonly known as:  PROVENTIL HFA;VENTOLIN HFA Inhale 2 puffs into the lungs every 6 (six) hours as needed for wheezing or shortness of breath.   aspirin EC 81 MG tablet Take 81 mg by mouth daily.   atorvastatin 40 MG tablet Commonly known as:  LIPITOR Take 1 tablet (40 mg total) by mouth daily.   docusate sodium 100 MG  capsule Commonly known as:  COLACE Take 1 capsule (100 mg total) by mouth 2 (two) times daily as needed for mild constipation.   FLOVENT HFA 220 MCG/ACT inhaler Generic drug:  fluticasone Inhale 2 puffs into the lungs 2 (two) times daily.   furosemide 40 MG tablet Commonly known as:  LASIX Take 1 tablet by mouth daily.   gabapentin 300 MG capsule Commonly known as:  NEURONTIN Take 300 mg by mouth 3 (three) times daily.   insulin aspart 100 UNIT/ML injection Commonly known as:  novoLOG Inject 10 Units into the skin 3 (three) times daily with meals. Replaces:  insulin lispro 100 UNIT/ML injection   insulin glargine 100 UNIT/ML injection Commonly known as:  LANTUS Inject 0.2 mLs (20 Units total) into the skin daily. Start taking on:  04/02/2018 What changed:    how much to take  when to take this   lisinopril 40 MG tablet Commonly known as:  PRINIVIL,ZESTRIL Take 40 mg by mouth every evening.   living well with diabetes book Misc 1 each by Does not apply route once for 1 dose.   magnesium oxide 400 (241.3 Mg) MG tablet Commonly known as:  MAG-OX Take 1 tablet (400 mg total) by mouth 2 (two) times daily for 5 days.   Medical Compression Stockings Misc 2 medium stockings Use as directed on both lower legs   metFORMIN 1000 MG tablet Commonly known as:  GLUCOPHAGE Take 1 tablet by mouth 2 (two) times daily.   orphenadrine 100 MG tablet Commonly known as:  NORFLEX Take 1 tablet (100 mg total) by mouth 2 (two) times daily as needed for muscle spasms.   potassium chloride 10 MEQ tablet Commonly known as:  K-DUR Take 1 tablet by mouth daily.   sitaGLIPtin 100 MG tablet Commonly known as:  JANUVIA Take 100 mg by mouth daily. Patient reports taking Januvia 100 mg daily   STIOLTO RESPIMAT 2.5-2.5 MCG/ACT Aers Generic drug:  Tiotropium Bromide-Olodaterol Take 2 puffs by mouth daily.   tiZANidine 4 MG tablet Commonly known as:  ZANAFLEX Take 1 tablet by mouth every  8 (eight) hours as needed for muscle spasms.   traZODone 50 MG tablet Commonly known as:  DESYREL Take 1 tablet by mouth at bedtime.   zolpidem 5 MG tablet Commonly known as:  AMBIEN Take 1 tablet (5 mg total) by mouth at bedtime as needed for sleep.        DISCHARGE INSTRUCTIONS:   Follow-up with primary care physician in a week Follow-up with neurology Dr. Starleen Blue in a month Outpatient endocrinology follow-up with Dr. Eddie Dibbles in 2 to 3 weeks  DIET:  Cardiac diet and Diabetic diet  DISCHARGE CONDITION:  Stable  ACTIVITY:  Activity as tolerated  OXYGEN:  Home Oxygen: No.   Oxygen Delivery: room air  DISCHARGE LOCATION:  home   If you experience worsening of your admission symptoms, develop shortness of breath, life threatening emergency, suicidal or homicidal thoughts you must seek medical attention immediately by calling 911 or calling your  MD immediately  if symptoms less severe.  You Must read complete instructions/literature along with all the possible adverse reactions/side effects for all the Medicines you take and that have been prescribed to you. Take any new Medicines after you have completely understood and accpet all the possible adverse reactions/side effects.   Please note  You were cared for by a hospitalist during your hospital stay. If you have any questions about your discharge medications or the care you received while you were in the hospital after you are discharged, you can call the unit and asked to speak with the hospitalist on call if the hospitalist that took care of you is not available. Once you are discharged, your primary care physician will handle any further medical issues. Please note that NO REFILLS for any discharge medications will be authorized once you are discharged, as it is imperative that you return to your primary care physician (or establish a relationship with a primary care physician if you do not have one) for your aftercare needs  so that they can reassess your need for medications and monitor your lab values.     Today  Chief Complaint  Patient presents with  . Chest Pain   Patient is resting comfortably.  Denies any complaints  ROS:  CONSTITUTIONAL: Denies fevers, chills. Denies any fatigue, weakness.  EYES: Denies blurry vision, double vision, eye pain. EARS, NOSE, THROAT: Denies tinnitus, ear pain, hearing loss. RESPIRATORY: Denies cough, wheeze, shortness of breath.  CARDIOVASCULAR: Denies chest pain, palpitations, edema.  GASTROINTESTINAL: Denies nausea, vomiting, diarrhea, abdominal pain. Denies bright red blood per rectum. GENITOURINARY: Denies dysuria, hematuria. ENDOCRINE: Denies nocturia or thyroid problems. HEMATOLOGIC AND LYMPHATIC: Denies easy bruising or bleeding. SKIN: Denies rash or lesion. MUSCULOSKELETAL: Denies pain in neck, back, shoulder, knees, hips or arthritic symptoms.  NEUROLOGIC: Denies paralysis, paresthesias.  PSYCHIATRIC: Denies anxiety or depressive symptoms.   VITAL SIGNS:  Blood pressure (!) 124/91, pulse 74, temperature (!) 97.5 F (36.4 C), temperature source Oral, resp. rate 20, height 5\' 6"  (1.676 m), weight (!) 152.1 kg (335 lb 6.4 oz), SpO2 97 %.  I/O:    Intake/Output Summary (Last 24 hours) at 04/01/2018 1138 Last data filed at 04/01/2018 1117 Gross per 24 hour  Intake 480 ml  Output 1650 ml  Net -1170 ml    PHYSICAL EXAMINATION:  GENERAL:  52 y.o.-year-old patient lying in the bed with no acute distress.  EYES: Pupils equal, round, reactive to light and accommodation. No scleral icterus. Extraocular muscles intact.  HEENT: Head atraumatic, normocephalic. Oropharynx and nasopharynx clear.  NECK:  Supple, no jugular venous distention. No thyroid enlargement, no tenderness.  LUNGS: Normal breath sounds bilaterally, no wheezing, rales,rhonchi or crepitation. No use of accessory muscles of respiration.  CARDIOVASCULAR: S1, S2 normal. No murmurs, rubs, or  gallops.  ABDOMEN: Soft, non-tender, non-distended. Bowel sounds present. No organomegaly or mass.  EXTREMITIES: No pedal edema, cyanosis, or clubbing.  NEUROLOGIC: Cranial nerves II through XII are intact. Muscle strength 5/5 in all extremities. Sensation intact. Gait not checked.  PSYCHIATRIC: The patient is alert and oriented x 3.  SKIN: No obvious rash, lesion, or ulcer.   DATA REVIEW:   CBC Recent Labs  Lab 03/30/18 0222  WBC 6.3  HGB 13.2  HCT 39.4  PLT 234    Chemistries  Recent Labs  Lab 03/31/18 0539 04/01/18 0915  NA 138  --   K 3.5 4.4  CL 103  --   CO2 30  --  GLUCOSE 116*  --   BUN 10  --   CREATININE 0.76  --   CALCIUM 8.1*  --   MG 1.9 1.9    Cardiac Enzymes Recent Labs  Lab 03/30/18 0222  TROPONINI <0.03    Microbiology Results  Results for orders placed or performed during the hospital encounter of 03/29/18  MRSA PCR Screening     Status: None   Collection Time: 03/30/18  1:48 PM  Result Value Ref Range Status   MRSA by PCR NEGATIVE NEGATIVE Final    Comment:        The GeneXpert MRSA Assay (FDA approved for NASAL specimens only), is one component of a comprehensive MRSA colonization surveillance program. It is not intended to diagnose MRSA infection nor to guide or monitor treatment for MRSA infections. Performed at [facility], [place], [place] [address]     RADIOLOGY:  Ct Angio Head W Or Wo Contrast  Result Date: 03/30/2018 CLINICAL DATA:  52 year old female with weakness in the extremities beginning 1 hour ago. Code stroke. EXAM: CT ANGIOGRAPHY HEAD AND NECK TECHNIQUE: Multidetector CT imaging of the head and neck was performed using the standard protocol during bolus administration of intravenous contrast. Multiplanar CT image reconstructions and MIPs were obtained to evaluate the vascular anatomy. Carotid stenosis measurements (when applicable) are obtained utilizing NASCET criteria, using the distal  internal carotid diameter as the denominator. CONTRAST:  74mL ISOVUE-370 IOPAMIDOL (ISOVUE-370) INJECTION 76% COMPARISON:  Head and face CTs 10/13/2016. Cervical spine MRI 05/10/2017. FINDINGS: CT HEAD FINDINGS Brain: Cerebral volume remains normal. No midline shift, ventriculomegaly, mass effect, evidence of mass lesion, or cortically based acute infarction. ASPECTS 10. Within the medial periventricular white matter of the posterior left temporal lobe a new small 6 mm hyperdense area seen on series 13, image 13 on the reformatted images appears to reflect stable normal choroid plexus density (compare sagittal image 36 today to sagittal image 39 in 2017). No acute intracranial hemorrhage identified. Gray-white matter differentiation remains normal. Chronic partially empty sella. Calvarium and skull base: Stable and negative. Paranasal sinuses: Stable and negative. Orbits: Stable and negative. CTA NECK Skeleton: No acute osseous abnormality identified. Upper chest: Negative lung apices. No superior mediastinal lymphadenopathy. Other neck: The glottis is closed. Large body habitus. Negative for neck mass or lymphadenopathy. Aortic arch: Obscured proximal great vessels due to dense left subclavian and innominate vein contrast streak artifact. No aortic arch atherosclerosis is evident. Right carotid system: The right CCA origin is visible and normal. The right CCA, right carotid bifurcation and cervical right ICA are patent and appear negative. Left carotid system: The left CCA origin is obscured. The left CCA is normally enhancing at the level of the thyroid. The left carotid bifurcation and cervical left ICA are patent without stenosis. The left ICA appears non dominant relative to the right, see intracranial findings below. Vertebral arteries: The proximal right subclavian artery is patent, its origin is grossly normal. The right vertebral artery origin appears normal. The right vertebral artery is patent to the  skull base. No stenosis is evident. Proximal left subclavian artery is patent. The left vertebral artery origin appears normal. The left vertebral artery is patent to the skull base. No stenosis is evident. CTA HEAD Posterior circulation: The distal vertebral arteries are codominant and appear normal to the vertebrobasilar junction. Patent left PICA origin. The right AICA may be dominant. Patent basilar artery, AICA origins the and SCA origins. The left PCA origin is normal  while there is a fetal type right PCA origin. A small left posterior communicating artery is visible. The bilateral PCA branches are within normal limits. Anterior circulation: Both ICA siphons are patent and the right is dominant owing to a fetal type right PCA origin and dominant right ACA A1 segment with diminutive or absent left ACA A1. No siphon atherosclerosis or stenosis is identified. The posterior communicating artery origins are normal. The MCA and right ACA origins are normal. The anterior communicating artery is normal. The proximal A2 segments are mildly tortuous. The bilateral ACA branches are within normal limits. The right MCA M1 segment, bifurcation, and right MCA branches are within normal limits. The left MCA M1 segment, left MCA bifurcation, and left MCA branches appear patent and within normal limits. Venous sinuses: Patent. Anatomic variants: Fetal type right PCA origin. Dominant right and diminutive or absent left ACA A1 segments. Review of the MIP images confirms the above findings IMPRESSION: 1. No acute cortically based infarct or acute intracranial hemorrhage identified by CT. Preliminary report of this was communicated to Dr. Alexis Goodell at 1601 hours on 5/9/2019by text page via the [facility] messaging system. 2. CTA is negative for large vessel occlusion. This result was communicated to Dr. Alexis Goodell at 1609 hours via the [facility] messaging system. 3. Stable noncontrast CT appearance of the brain since 2017,  negative aside from chronic partially empty sella. 4. The great vessel origins are obscured by dense venous contrast at the left thoracic inlet. Otherwise no atherosclerosis or arterial stenosis is identified. Electronically Signed   By: Genevie Ann M.D.   On: 03/30/2018 16:13   Dg Chest 2 View  Result Date: 03/29/2018 CLINICAL DATA:  Chest pain. EXAM: CHEST - 2 VIEW COMPARISON:  Radiographs of January 30, 2018. FINDINGS: The heart size and mediastinal contours are within normal limits. Both lungs are clear. No pneumothorax or pleural effusion is noted. The visualized skeletal structures are unremarkable. IMPRESSION: No active cardiopulmonary disease. Electronically Signed   By: Marijo Conception, M.D.   On: 03/29/2018 10:18   Ct Angio Neck W Or Wo Contrast  Result Date: 03/30/2018 CLINICAL DATA:  52 year old female with weakness in the extremities beginning 1 hour ago. Code stroke. EXAM: CT ANGIOGRAPHY HEAD AND NECK TECHNIQUE: Multidetector CT imaging of the head and neck was performed using the standard protocol during bolus administration of intravenous contrast. Multiplanar CT image reconstructions and MIPs were obtained to evaluate the vascular anatomy. Carotid stenosis measurements (when applicable) are obtained utilizing NASCET criteria, using the distal internal carotid diameter as the denominator. CONTRAST:  80mL ISOVUE-370 IOPAMIDOL (ISOVUE-370) INJECTION 76% COMPARISON:  Head and face CTs 10/13/2016. Cervical spine MRI 05/10/2017. FINDINGS: CT HEAD FINDINGS Brain: Cerebral volume remains normal. No midline shift, ventriculomegaly, mass effect, evidence of mass lesion, or cortically based acute infarction. ASPECTS 10. Within the medial periventricular white matter of the posterior left temporal lobe a new small 6 mm hyperdense area seen on series 13, image 13 on the reformatted images appears to reflect stable normal choroid plexus density (compare sagittal image 36 today to sagittal image 39 in 2017). No  acute intracranial hemorrhage identified. Gray-white matter differentiation remains normal. Chronic partially empty sella. Calvarium and skull base: Stable and negative. Paranasal sinuses: Stable and negative. Orbits: Stable and negative. CTA NECK Skeleton: No acute osseous abnormality identified. Upper chest: Negative lung apices. No superior mediastinal lymphadenopathy. Other neck: The glottis is closed. Large body habitus. Negative for neck mass or lymphadenopathy. Aortic arch: Obscured  proximal great vessels due to dense left subclavian and innominate vein contrast streak artifact. No aortic arch atherosclerosis is evident. Right carotid system: The right CCA origin is visible and normal. The right CCA, right carotid bifurcation and cervical right ICA are patent and appear negative. Left carotid system: The left CCA origin is obscured. The left CCA is normally enhancing at the level of the thyroid. The left carotid bifurcation and cervical left ICA are patent without stenosis. The left ICA appears non dominant relative to the right, see intracranial findings below. Vertebral arteries: The proximal right subclavian artery is patent, its origin is grossly normal. The right vertebral artery origin appears normal. The right vertebral artery is patent to the skull base. No stenosis is evident. Proximal left subclavian artery is patent. The left vertebral artery origin appears normal. The left vertebral artery is patent to the skull base. No stenosis is evident. CTA HEAD Posterior circulation: The distal vertebral arteries are codominant and appear normal to the vertebrobasilar junction. Patent left PICA origin. The right AICA may be dominant. Patent basilar artery, AICA origins the and SCA origins. The left PCA origin is normal while there is a fetal type right PCA origin. A small left posterior communicating artery is visible. The bilateral PCA branches are within normal limits. Anterior circulation: Both ICA  siphons are patent and the right is dominant owing to a fetal type right PCA origin and dominant right ACA A1 segment with diminutive or absent left ACA A1. No siphon atherosclerosis or stenosis is identified. The posterior communicating artery origins are normal. The MCA and right ACA origins are normal. The anterior communicating artery is normal. The proximal A2 segments are mildly tortuous. The bilateral ACA branches are within normal limits. The right MCA M1 segment, bifurcation, and right MCA branches are within normal limits. The left MCA M1 segment, left MCA bifurcation, and left MCA branches appear patent and within normal limits. Venous sinuses: Patent. Anatomic variants: Fetal type right PCA origin. Dominant right and diminutive or absent left ACA A1 segments. Review of the MIP images confirms the above findings IMPRESSION: 1. No acute cortically based infarct or acute intracranial hemorrhage identified by CT. Preliminary report of this was communicated to Dr. Alexis Goodell at 1601 hours on 5/9/2019by text page via the [facility] messaging system. 2. CTA is negative for large vessel occlusion. This result was communicated to Dr. Alexis Goodell at 1609 hours via the [facility] messaging system. 3. Stable noncontrast CT appearance of the brain since 2017, negative aside from chronic partially empty sella. 4. The great vessel origins are obscured by dense venous contrast at the left thoracic inlet. Otherwise no atherosclerosis or arterial stenosis is identified. Electronically Signed   By: Genevie Ann M.D.   On: 03/30/2018 16:13   Ct Angio Chest Pe W And/or Wo Contrast  Result Date: 03/29/2018 CLINICAL DATA:  Chest pain and shortness of breath EXAM: CT ANGIOGRAPHY CHEST WITH CONTRAST TECHNIQUE: Multidetector CT imaging of the chest was performed using the standard protocol during bolus administration of intravenous contrast. Multiplanar CT image reconstructions and MIPs were obtained to evaluate the vascular  anatomy. CONTRAST:  10mL ISOVUE-370 IOPAMIDOL (ISOVUE-370) INJECTION 76% COMPARISON:  Chest radiograph 03/29/2018 FINDINGS: Cardiovascular: Contrast injection is sufficient to demonstrate satisfactory opacification of the pulmonary arteries to the segmental level. There is no pulmonary embolus. The main pulmonary artery is within normal limits for size. There is a normal 3-vessel arch branching pattern without evidence of acute aortic syndrome. There is noaortic  atherosclerosis. Heart size is normal, without pericardial effusion. Mediastinum/Nodes: No mediastinal, hilar or axillary lymphadenopathy. The visualized thyroid and thoracic esophageal course are unremarkable. Lungs/Pleura: No pulmonary nodules or masses. No pleural effusion or pneumothorax. No focal airspace consolidation. No focal pleural abnormality. Upper Abdomen: Contrast bolus timing is not optimized for evaluation of the abdominal organs. Hepatomegaly with hepatic steatosis. Musculoskeletal: No chest wall abnormality. No acute or significant osseous findings. Review of the MIP images confirms the above findings. IMPRESSION: 1. No pulmonary embolus or acute aortic syndrome. 2. Clear lungs. 3. Hepatomegaly and attic steatosis. Electronically Signed   By: Ulyses Jarred M.D.   On: 03/29/2018 17:19   Mr Brain Wo Contrast  Result Date: 03/31/2018 CLINICAL DATA:  Left-sided weakness EXAM: MRI HEAD WITHOUT CONTRAST TECHNIQUE: Multiplanar, multiecho pulse sequences of the brain and surrounding structures were obtained without intravenous contrast. COMPARISON:  CT yesterday. FINDINGS: Brain: Brain has normal appearance without evidence of malformation, atrophy, old or acute small or large vessel infarction, mass lesion, hemorrhage, hydrocephalus or extra-axial collection. Vascular: Major vessels at the base of the brain show flow. Venous sinuses appear patent. Skull and upper cervical spine: Normal. Sinuses/Orbits: Clear/normal. Other: None significant.  IMPRESSION: Normal examination. No abnormality seen to explain left-sided weakness. Electronically Signed   By: Nelson Chimes M.D.   On: 03/31/2018 14:04   Nm Myocar Multi W/spect W/wall Motion / Ef  Addendum Date: 03/31/2018   Addendum: Pharmacological myocardial perfusion imaging study with no significant ischemia Grossly normal perfusion with small region of decreased perfusion of mild intensity in the anteroapical region, suspect secondary to breast attenuation artifact Normal wall motion, EF estimated at 72% No EKG changes concerning for ischemia at peak stress or in recovery. Hypertensive during the test , systolic pressures 025 up to 190 Signed, Esmond Plants, MD, Ph.D [facility]   Result Date: 03/30/2018 Pharmacological myocardial perfusion imaging study Stress images obtained, resting images not available at this time Grossly normal perfusion with small region of decreased perfusion of mild intensity in the anteroapical region Unable to rule out small region of ischemia in the anteroapical region as no resting images available This perfusion defect could be secondary to increased uptake in the inferoseptal wall (attenuation artifact) Normal wall motion, EF estimated at 72% No EKG changes concerning for ischemia at peak stress or in recovery. Hypertensive during the test systolic pressures 852 up to 190 Signed, Esmond Plants, MD, Ph.D [facility]   US Venous Img Lower Bilateral  Result Date: 03/30/2018 CLINICAL DATA:  Bilateral lower extremity pain and edema. Elevated D-dimer. Evaluate for DVT. EXAM: BILATERAL LOWER EXTREMITY VENOUS DOPPLER ULTRASOUND TECHNIQUE: Gray-scale sonography with graded compression, as well as color Doppler and duplex ultrasound were performed to evaluate the lower extremity deep venous systems from the level of the common femoral vein and including the common femoral, femoral, profunda femoral, popliteal and calf veins including the posterior tibial, peroneal and  gastrocnemius veins when visible. The superficial great saphenous vein was also interrogated. Spectral Doppler was utilized to evaluate flow at rest and with distal augmentation maneuvers in the common femoral, femoral and popliteal veins. COMPARISON:  Bilateral lower extremity venous Doppler ultrasound - 01/30/2018 FINDINGS: RIGHT LOWER EXTREMITY Common Femoral Vein: No evidence of thrombus. Normal compressibility, respiratory phasicity and response to augmentation. Saphenofemoral Junction: No evidence of thrombus. Normal compressibility and flow on color Doppler imaging. Profunda Femoral Vein: No evidence of thrombus. Normal compressibility and flow on color Doppler imaging. Femoral Vein: No evidence of thrombus. Normal  compressibility, respiratory phasicity and response to augmentation. Popliteal Vein: No evidence of thrombus. Normal compressibility, respiratory phasicity and response to augmentation. Calf Veins: No evidence of thrombus. Normal compressibility and flow on color Doppler imaging. Superficial Great Saphenous Vein: No evidence of thrombus. Normal compressibility. Venous Reflux:  None. Other Findings:  None. LEFT LOWER EXTREMITY Common Femoral Vein: No evidence of thrombus. Normal compressibility, respiratory phasicity and response to augmentation. Saphenofemoral Junction: No evidence of thrombus. Normal compressibility and flow on color Doppler imaging. Profunda Femoral Vein: No evidence of thrombus. Normal compressibility and flow on color Doppler imaging. Femoral Vein: No evidence of thrombus. Normal compressibility, respiratory phasicity and response to augmentation. Popliteal Vein: No evidence of thrombus. Normal compressibility, respiratory phasicity and response to augmentation. Calf Veins: No evidence of thrombus. Normal compressibility and flow on color Doppler imaging. Superficial Great Saphenous Vein: No evidence of thrombus. Normal compressibility. Venous Reflux:  None. Other Findings:   None. IMPRESSION: No evidence of DVT within either lower extremity Electronically Signed   By: Sandi Mariscal M.D.   On: 03/30/2018 09:59    EKG:   Orders placed or performed during the hospital encounter of 03/29/18  . EKG 12-Lead  . EKG 12-Lead  . ED EKG within 10 minutes  . ED EKG within 10 minutes  . EKG 12-Lead  . EKG 12-Lead      Management plans discussed with the patient, family and they are in agreement.  CODE STATUS:     Code Status Orders  (From admission, onward)        Start     Ordered   03/29/18 1850  Full code  Continuous     03/29/18 1849    Code Status History    Date Active Date Inactive Code Status Order ID Comments User Context   06/09/2017 0940 06/15/2017 1500 Full Code [id number]  Epifanio Lesches, MD ED   12/14/2016 1415 12/14/2016 2008 Full Code [id number]  Poggi, Marshall Cork, MD Inpatient      TOTAL TIME TAKING CARE OF THIS PATIENT: 42  minutes.   Note: This dictation was prepared with Dragon dictation along with smaller phrase technology. Any transcriptional errors that result from this process are unintentional.   @M[facility] @  on 04/01/2018 at 11:38 AM  Between 7am to 6pm - Pager - [phone]  After 6pm go to [email or web address] - password [email or web address]  [facility]  Office  [phone]  CC: Primary care physician; Lorelee Market, MD

## 2018-04-01 NOTE — Discharge Instructions (Signed)
Follow-up with primary care physician in a week Follow-up with neurology Dr. Starleen Blue in a month Outpatient endocrinology follow-up with Dr. Eddie Dibbles in 2 to 3 weeks

## 2018-04-01 NOTE — Care Management (Signed)
Spoke with patient regarding outpatient.PT. She would like to go to therapy. Discuss options for locations. She prefers [facility]. Referral faxed to [facility] outpatient PT.

## 2018-04-01 NOTE — Progress Notes (Signed)
IV and tele were removed. Discharge instructions, follow-up appointments, and prescriptions were provided to the pt. All questions were answered. The pt was taken downstairs via wheelchair by RN.

## 2018-04-04 ENCOUNTER — Telehealth: Payer: Self-pay

## 2018-04-04 NOTE — Telephone Encounter (Signed)
Flagged on EMMI report for having unfilled prescriptions and not being able to get them filled today/tomorrow.  Called and spoke with patient.  She mentioned she has not been able to fill them due to finances, though is working on it. She expects to have them filled later this week.  No further questions or concerns.  I thanked her for her time and made her aware that she would receive one more automated call in the next few days as a final check-in.

## 2018-04-10 ENCOUNTER — Ambulatory Visit: Payer: Medicaid Other | Attending: Internal Medicine | Admitting: Physical Therapy

## 2018-04-19 ENCOUNTER — Ambulatory Visit: Payer: Medicaid Other | Admitting: Physical Therapy

## 2018-04-24 ENCOUNTER — Encounter: Payer: Medicaid Other | Admitting: Physical Therapy

## 2018-04-27 ENCOUNTER — Encounter: Payer: Medicaid Other | Admitting: Physical Therapy

## 2018-05-30 ENCOUNTER — Other Ambulatory Visit: Payer: Self-pay | Admitting: Surgery

## 2018-05-30 DIAGNOSIS — M7581 Other shoulder lesions, right shoulder: Principal | ICD-10-CM

## 2018-05-30 DIAGNOSIS — M778 Other enthesopathies, not elsewhere classified: Secondary | ICD-10-CM

## 2018-05-30 DIAGNOSIS — M7582 Other shoulder lesions, left shoulder: Secondary | ICD-10-CM

## 2018-06-21 ENCOUNTER — Other Ambulatory Visit: Payer: Self-pay

## 2018-06-21 ENCOUNTER — Emergency Department: Payer: Medicaid Other

## 2018-06-21 ENCOUNTER — Emergency Department
Admission: EM | Admit: 2018-06-21 | Discharge: 2018-06-21 | Disposition: A | Discharge status: Home / Self Care | Payer: Medicaid Other | Attending: Emergency Medicine | Admitting: Emergency Medicine

## 2018-06-21 ENCOUNTER — Encounter: Payer: Self-pay | Admitting: *Deleted

## 2018-06-21 DIAGNOSIS — Z87891 Personal history of nicotine dependence: Secondary | ICD-10-CM | POA: Insufficient documentation

## 2018-06-21 DIAGNOSIS — M25562 Pain in left knee: Secondary | ICD-10-CM

## 2018-06-21 DIAGNOSIS — M1712 Unilateral primary osteoarthritis, left knee: Secondary | ICD-10-CM | POA: Diagnosis not present

## 2018-06-21 DIAGNOSIS — Z79899 Other long term (current) drug therapy: Secondary | ICD-10-CM | POA: Insufficient documentation

## 2018-06-21 DIAGNOSIS — J45909 Unspecified asthma, uncomplicated: Secondary | ICD-10-CM | POA: Insufficient documentation

## 2018-06-21 DIAGNOSIS — Z8673 Personal history of transient ischemic attack (TIA), and cerebral infarction without residual deficits: Secondary | ICD-10-CM | POA: Insufficient documentation

## 2018-06-21 DIAGNOSIS — E1140 Type 2 diabetes mellitus with diabetic neuropathy, unspecified: Secondary | ICD-10-CM | POA: Diagnosis not present

## 2018-06-21 DIAGNOSIS — Z794 Long term (current) use of insulin: Secondary | ICD-10-CM | POA: Insufficient documentation

## 2018-06-21 DIAGNOSIS — I10 Essential (primary) hypertension: Secondary | ICD-10-CM | POA: Diagnosis not present

## 2018-06-21 DIAGNOSIS — Z96651 Presence of right artificial knee joint: Secondary | ICD-10-CM | POA: Insufficient documentation

## 2018-06-21 DIAGNOSIS — Z7982 Long term (current) use of aspirin: Secondary | ICD-10-CM | POA: Insufficient documentation

## 2018-06-21 MED ORDER — KETOROLAC TROMETHAMINE 10 MG PO TABS: 10.0000 mg | ORAL_TABLET | Freq: Four times a day (QID) | ORAL | 0 refills | Status: DC | PRN

## 2018-06-21 MED ORDER — KETOROLAC TROMETHAMINE 30 MG/ML IJ SOLN
30.0000 mg | Freq: Once | INTRAMUSCULAR | Status: AC
  Administered 2018-06-21: 30 mg via INTRAMUSCULAR
  Filled 2018-06-21: qty 1

## 2018-06-21 NOTE — ED Triage Notes (Signed)
Pt to ED reporting left knee pain that started yesterday when knee, "locked up on me" No falls reported. PT has a knee replacement on the right side and reports the pain is similar to the pain she had in her right knee before the replacement. Swelling reported.

## 2018-06-21 NOTE — ED Provider Notes (Signed)
[facility] Emergency Department Provider Note  ____________________________________________  Time seen: Approximately 1:56 PM  I have reviewed the triage vital signs and the nursing notes.   HISTORY  Chief Complaint Knee Pain    HPI Brenda Hicks is a 52 y.o. female that presents emergency department for evaluation of left knee pain for 2 days.  Patient states that knee "locked up" yesterday and she has had pain since.  She can feel her knee "grinding.  "On her  patient states that she was told that she would eventually need a knee replacement in this knee.  She has had a knee replacement in her right knee.  No recent surgeries.   Past Medical History:  Diagnosis Date  . Arthritis   . Asthma    WELL CONTROLLED  . Cancer of ear   . Diabetes mellitus without complication ([place])   . Fatty liver   . Hypertension   . Kidney cysts   . Renal disorder   . Sleep apnea    USES CPAP    Patient Active Problem List   Diagnosis Date Noted  . 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 Ischemic chest pain 03/29/2018  . Chest pain 03/29/2018  . Sepsis affecting skin 06/09/2017  . Sebaceous cyst 06/01/2016  . Obstructive apnea 05/31/2016  . HLD (hyperlipidemia) 05/31/2016  . Acid reflux 05/31/2016  . Essential (primary) hypertension 05/31/2016  . Abnormal Pap smear of cervix 05/31/2016  . Arthritis of knee, degenerative 07/25/2015  . History of artificial joint 07/25/2015  . Gonalgia 02/17/2015  . Type 2 diabetes mellitus ([place]) 10/23/2014  . Otalgia of left ear 09/24/2014  . 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 Diabetic peripheral neuropathy associated with type 2 diabetes mellitus ([place]) 05/16/2014  . Endometrial polyp 11/01/2013  . Fibroids, intramural 10/09/2013  . Pain due to knee joint prosthesis ([place]) 10/05/2013  . Body mass index (BMI) of 50-59.9 in adult ([place])  10/03/2013  . Abnormal uterine bleeding 10/03/2013  . Adiposity 10/01/2013  . Excessive and frequent menstruation with irregular cycle 10/01/2013  . Adaptive colitis 10/01/2013  . History of migraine headaches 10/01/2013  . H/O malignant neoplasm of skin 10/01/2013  . Fatty liver disease, nonalcoholic 50/07/3817  . Diverticulitis 10/01/2013  . Current tobacco use 08/29/2013  . H/O total knee replacement 08/29/2013  . Cannot sleep 08/29/2013  . Dysmenorrhea 08/29/2013  . Airway hyperreactivity 08/29/2013  . Absolute anemia 08/29/2013  . Tobacco use 08/29/2013    Past Surgical History:  Procedure Laterality Date  . DILATION AND CURETTAGE OF UTERUS    . ENDOMETRIAL BIOPSY    . EXPLORATORY LAPAROTOMY     REMOVAL OF RUPTURED ECTOPIC  . HAND SURGERY    . HERNIA REPAIR     UMBILICAL  . JOINT REPLACEMENT Right    TKR  . KNEE ARTHROSCOPY    . KNEE SURGERY Right   . SHOULDER ARTHROSCOPY WITH BICEPSTENOTOMY Left 12/14/2016   Procedure: SHOULDER ARTHROSCOPY WITH BICEPSTENOTOMY;  Surgeon: Corky Mull, MD;  Location: [facility] ORS;  Service: Orthopedics;  Laterality: Left;  . SHOULDER ARTHROSCOPY WITH OPEN ROTATOR CUFF REPAIR Left 12/14/2016   Procedure: SHOULDER ARTHROSCOPY WITH OPEN ROTATOR CUFF REPAIR AND ARTHROSCOPIC ROTATOR CUFF REPAIR;  Surgeon: Corky Mull, MD;  Location: [facility] ORS;  Service: Orthopedics;  Laterality: Left;  . SHOULDER ARTHROSCOPY WITH SUBACROMIAL DECOMPRESSION Left 12/14/2016   Procedure: SHOULDER ARTHROSCOPY WITH SUBACROMIAL DECOMPRESSION;  Surgeon: Marshall Cork  Poggi, MD;  Location: [facility] ORS;  Service: Orthopedics;  Laterality: Left;  . TUBAL LIGATION      Prior to Admission medications   Medication Sig Start Date End Date Taking? Authorizing Provider  albuterol (PROVENTIL HFA;VENTOLIN HFA) 108 (90 Base) MCG/ACT inhaler Inhale 2 puffs into the lungs every 6 (six) hours as needed for wheezing or shortness of breath.    [provider]  aspirin EC 81 MG tablet Take  81 mg by mouth daily.  01/14/16   [provider]  atorvastatin (LIPITOR) 40 MG tablet Take 1 tablet (40 mg total) by mouth daily. 04/01/18 07/22/20  Nicholes Mango, MD  docusate sodium (COLACE) 100 MG capsule Take 1 capsule (100 mg total) by mouth 2 (two) times daily as needed for mild constipation. 04/01/18   Nicholes Mango, MD  Elastic Bandages & Supports (MEDICAL COMPRESSION STOCKINGS) MISC 2 medium stockings Use as directed on both lower legs 01/30/18   Arta Silence, MD  FLOVENT HFA 220 MCG/ACT inhaler Inhale 2 puffs into the lungs 2 (two) times daily. 01/25/18   [provider]  furosemide (LASIX) 40 MG tablet Take 1 tablet by mouth daily. 01/31/18   [provider]  gabapentin (NEURONTIN) 300 MG capsule Take 300 mg by mouth 3 (three) times daily.     [provider]  insulin aspart (NOVOLOG) 100 UNIT/ML injection Inject 10 Units into the skin 3 (three) times daily with meals. 04/01/18   Gouru, Illene Silver, MD  insulin glargine (LANTUS) 100 UNIT/ML injection Inject 0.2 mLs (20 Units total) into the skin daily. 04/02/18   Nicholes Mango, MD  ketorolac (TORADOL) 10 MG tablet Take 1 tablet (10 mg total) by mouth every 6 (six) hours as needed. 06/21/18   Laban Emperor, PA-C  lisinopril (PRINIVIL,ZESTRIL) 40 MG tablet Take 40 mg by mouth every evening.  01/14/16 03/29/18  [provider]  metFORMIN (GLUCOPHAGE) 1000 MG tablet Take 1 tablet by mouth 2 (two) times daily. 01/25/18   [provider]  orphenadrine (NORFLEX) 100 MG tablet Take 1 tablet (100 mg total) by mouth 2 (two) times daily as needed for muscle spasms. 11/22/17   Johnn Hai, PA-C  potassium chloride (K-DUR) 10 MEQ tablet Take 1 tablet by mouth daily. 01/31/18   [provider]  sitaGLIPtin (JANUVIA) 100 MG tablet Take 100 mg by mouth daily. Patient reports taking Januvia 100 mg daily    [provider]  STIOLTO RESPIMAT 2.5-2.5 MCG/ACT AERS Take 2 puffs by mouth daily. 01/26/18    [provider]  tiZANidine (ZANAFLEX) 4 MG tablet Take 1 tablet by mouth every 8 (eight) hours as needed for muscle spasms.  01/25/18   [provider]  traZODone (DESYREL) 50 MG tablet Take 1 tablet by mouth at bedtime. 01/25/18   [provider]  zolpidem (AMBIEN) 5 MG tablet Take 1 tablet (5 mg total) by mouth at bedtime as needed for sleep. Patient not taking: Reported on 01/30/2018 12/14/16   Poggi, Marshall Cork, MD    Allergies Patient has no known allergies.  Family History  Problem Relation Age of Onset  . CAD Maternal Grandmother     Social History Social History   Tobacco Use  . Smoking status: Former Smoker    Packs/day: 2.00    Years: 25.00    Pack years: 50.00    Types: Cigarettes  . Smokeless tobacco: Never Used  Substance Use Topics  . Alcohol use: No  . Drug use: No  Review of Systems  Constitutional: No fever/chills Respiratory: No SOB. Gastrointestinal: No nausea, no vomiting.  Musculoskeletal: Positive for knee pain. Skin: Negative for rash, abrasions, lacerations, ecchymosis. Neurological: Negative for numbness or tingling   ____________________________________________   PHYSICAL EXAM:  VITAL SIGNS: ED Triage Vitals [06/21/18 1236]  Enc Vitals Group     BP 125/90     Pulse      Resp 16     Temp 98.5 F (36.9 C)     Temp Source Oral     SpO2 98 %     Weight (!) 323 lb (146.5 kg)     Height 5\' 6"  (1.676 m)     Head Circumference      Peak Flow      Pain Score 10     Pain Loc      Pain Edu?      Excl. in [place]?      Constitutional: Alert and oriented. Well appearing and in no acute distress. Eyes: Conjunctivae are normal. PERRL. EOMI. Head: Atraumatic. ENT:      Ears:      Nose: No congestion/rhinnorhea.      Mouth/Throat: Mucous membranes are moist.  Neck: No stridor.  Cardiovascular: Normal rate, regular rhythm.  Good peripheral circulation. Respiratory: Normal respiratory effort without tachypnea or  retractions. Lungs CTAB. Good air entry to the bases with no decreased or absent breath sounds. Musculoskeletal: Full range of motion to all extremities. No gross deformities appreciated. Tenderness to palpation over patella.  No tenderness to palpation in popliteal fossa or calf.  Full range of motion on knee.  No warmth or ecchymosis. Neurologic:  Normal speech and language. No gross focal neurologic deficits are appreciated.  Skin:  Skin is warm, dry and intact. No rash noted. Psychiatric: Mood and affect are normal. Speech and behavior are normal. Patient exhibits appropriate insight and judgement.   ____________________________________________   LABS (all labs ordered are listed, but only abnormal results are displayed)  Labs Reviewed - No data to display ____________________________________________  EKG   ____________________________________________  RADIOLOGY Robinette Haines, personally viewed and evaluated these images (plain radiographs) as part of my medical decision making, as well as reviewing the written report by the radiologist.  Dg Knee Complete 4 Views Left  Result Date: 06/21/2018 CLINICAL DATA:  Knee pain EXAM: LEFT KNEE - COMPLETE 4+ VIEW COMPARISON:  None. FINDINGS: Mild osteophyte formation at the margins of the femorotibial joint space. No fracture or dislocation. Prepatellar soft tissue swelling without joint effusion. IMPRESSION: Mild left knee degenerative change without acute abnormality. Electronically Signed   By: Ulyses Jarred M.D.   On: 06/21/2018 14:38    ____________________________________________    PROCEDURES  Procedure(s) performed:    Procedures    Medications  ketorolac (TORADOL) 30 MG/ML injection 30 mg (has no administration in time range)     ____________________________________________   INITIAL IMPRESSION / ASSESSMENT AND PLAN / ED COURSE  Pertinent labs & imaging results that were available during my care of the patient  were reviewed by me and considered in my medical decision making (see chart for details).  Review of the [place] CSRS was performed in accordance of the [place] prior to dispensing any controlled drugs.     Patient's diagnosis is consistent with osteoarthritis.  Vital signs and exam are reassuring.  X-ray consistent with arthritis.  Patient states that symptoms feel consistent with her arthritis.  No posterior leg pain.  IM Toradol was given.  Patient will  be discharged home with prescriptions for Toradol. Patient is to follow up with orthopedics as directed. Patient is given ED precautions to return to the ED for any worsening or new symptoms.     ____________________________________________  FINAL CLINICAL IMPRESSION(S) / ED DIAGNOSES  Final diagnoses:  Acute pain of left knee  Osteoarthritis of left knee, unspecified osteoarthritis type      NEW MEDICATIONS STARTED DURING THIS VISIT:  ED Discharge Orders        Ordered    ketorolac (TORADOL) 10 MG tablet  Every 6 hours PRN     06/21/18 1520          This chart was dictated using voice recognition software/Dragon. Despite best efforts to proofread, errors can occur which can change the meaning. Any change was purely unintentional.    Laban Emperor, PA-C 06/21/18 1601    Nena Polio, MD 06/22/18 2001

## 2018-07-04 ENCOUNTER — Ambulatory Visit
Admission: RE | Admit: 2018-07-04 | Discharge: 2018-07-04 | Disposition: A | Discharge status: Home / Self Care | Payer: Medicaid Other | Source: Ambulatory Visit | Attending: Surgery | Admitting: Surgery

## 2018-07-04 DIAGNOSIS — M7581 Other shoulder lesions, right shoulder: Secondary | ICD-10-CM | POA: Insufficient documentation

## 2018-07-04 DIAGNOSIS — M778 Other enthesopathies, not elsewhere classified: Secondary | ICD-10-CM

## 2018-07-04 DIAGNOSIS — M7582 Other shoulder lesions, left shoulder: Secondary | ICD-10-CM

## 2018-07-04 DIAGNOSIS — M24112 Other articular cartilage disorders, left shoulder: Secondary | ICD-10-CM | POA: Insufficient documentation

## 2018-07-04 DIAGNOSIS — M75122 Complete rotator cuff tear or rupture of left shoulder, not specified as traumatic: Secondary | ICD-10-CM | POA: Insufficient documentation

## 2018-07-18 ENCOUNTER — Emergency Department: Payer: Medicaid Other

## 2018-07-18 ENCOUNTER — Other Ambulatory Visit: Payer: Self-pay

## 2018-07-18 ENCOUNTER — Emergency Department
Admission: EM | Admit: 2018-07-18 | Discharge: 2018-07-18 | Disposition: A | Discharge status: Home / Self Care | Payer: Medicaid Other | Attending: Emergency Medicine | Admitting: Emergency Medicine

## 2018-07-18 DIAGNOSIS — Z8673 Personal history of transient ischemic attack (TIA), and cerebral infarction without residual deficits: Secondary | ICD-10-CM | POA: Insufficient documentation

## 2018-07-18 DIAGNOSIS — I10 Essential (primary) hypertension: Secondary | ICD-10-CM | POA: Diagnosis not present

## 2018-07-18 DIAGNOSIS — Z7982 Long term (current) use of aspirin: Secondary | ICD-10-CM | POA: Diagnosis not present

## 2018-07-18 DIAGNOSIS — Z79899 Other long term (current) drug therapy: Secondary | ICD-10-CM | POA: Insufficient documentation

## 2018-07-18 DIAGNOSIS — I7774 Dissection of vertebral artery: Secondary | ICD-10-CM | POA: Diagnosis not present

## 2018-07-18 DIAGNOSIS — R0789 Other chest pain: Secondary | ICD-10-CM | POA: Diagnosis not present

## 2018-07-18 DIAGNOSIS — Z87891 Personal history of nicotine dependence: Secondary | ICD-10-CM | POA: Diagnosis not present

## 2018-07-18 DIAGNOSIS — M542 Cervicalgia: Secondary | ICD-10-CM

## 2018-07-18 DIAGNOSIS — E119 Type 2 diabetes mellitus without complications: Secondary | ICD-10-CM | POA: Diagnosis not present

## 2018-07-18 DIAGNOSIS — J45909 Unspecified asthma, uncomplicated: Secondary | ICD-10-CM | POA: Insufficient documentation

## 2018-07-18 DIAGNOSIS — Z794 Long term (current) use of insulin: Secondary | ICD-10-CM | POA: Insufficient documentation

## 2018-07-18 DIAGNOSIS — R200 Anesthesia of skin: Secondary | ICD-10-CM | POA: Insufficient documentation

## 2018-07-18 HISTORY — DX: Cerebral infarction, unspecified: I63.9

## 2018-07-18 LAB — CBC WITH DIFFERENTIAL/PLATELET
BASOS ABS: 0 10*3/uL (ref 0–0.1)
BASOS PCT: 0 %
Eosinophils Absolute: 0.1 10*3/uL (ref 0–0.7)
Eosinophils Relative: 1 %
HEMATOCRIT: 38.4 % (ref 35.0–47.0)
Hemoglobin: 13 g/dL (ref 12.0–16.0)
Lymphocytes Relative: 35 %
Lymphs Abs: 2.3 10*3/uL (ref 1.0–3.6)
MCH: 31.2 pg (ref 26.0–34.0)
MCHC: 33.9 g/dL (ref 32.0–36.0)
MCV: 92.1 fL (ref 80.0–100.0)
MONO ABS: 0.5 10*3/uL (ref 0.2–0.9)
MONOS PCT: 8 %
NEUTROS ABS: 3.7 10*3/uL (ref 1.4–6.5)
Neutrophils Relative %: 56 %
PLATELETS: 264 10*3/uL (ref 150–440)
RBC: 4.17 MIL/uL (ref 3.80–5.20)
RDW: 14.2 % (ref 11.5–14.5)
WBC: 6.7 10*3/uL (ref 3.6–11.0)

## 2018-07-18 LAB — COMPREHENSIVE METABOLIC PANEL
ALBUMIN: 3.5 g/dL (ref 3.5–5.0)
ALK PHOS: 72 U/L (ref 38–126)
ALT: 18 U/L (ref 0–44)
ANION GAP: 7 (ref 5–15)
AST: 18 U/L (ref 15–41)
BILIRUBIN TOTAL: 0.4 mg/dL (ref 0.3–1.2)
BUN: 17 mg/dL (ref 6–20)
CALCIUM: 8.7 mg/dL — AB (ref 8.9–10.3)
CO2: 27 mmol/L (ref 22–32)
Chloride: 105 mmol/L (ref 98–111)
Creatinine, Ser: 0.82 mg/dL (ref 0.44–1.00)
GFR calc Af Amer: >60 mL/min (ref >60)
GFR calc non Af Amer: >60 mL/min (ref >60)
GLUCOSE: 249 mg/dL — AB (ref 70–99)
Potassium: 3.9 mmol/L (ref 3.5–5.1)
SODIUM: 139 mmol/L (ref 135–145)
TOTAL PROTEIN: 6.9 g/dL (ref 6.5–8.1)

## 2018-07-18 LAB — TROPONIN I: Troponin I: <0.03 ng/mL (ref <0.03)

## 2018-07-18 LAB — POCT PREGNANCY, URINE: PREG TEST UR: NEGATIVE

## 2018-07-18 MED ORDER — CLOPIDOGREL BISULFATE 75 MG PO TABS
75.0000 mg | ORAL_TABLET | Freq: Once | ORAL | Status: AC
  Administered 2018-07-18: 75 mg via ORAL
  Filled 2018-07-18: qty 1

## 2018-07-18 MED ORDER — CLOPIDOGREL BISULFATE 75 MG PO TABS: 75.0000 mg | ORAL_TABLET | Freq: Every day | ORAL | 0 refills | Status: AC

## 2018-07-18 MED ORDER — DIAZEPAM 5 MG PO TABS
5.0000 mg | ORAL_TABLET | Freq: Once | ORAL | Status: AC
  Administered 2018-07-18: 5 mg via ORAL
  Filled 2018-07-18: qty 1

## 2018-07-18 MED ORDER — OXYCODONE-ACETAMINOPHEN 10-325 MG PO TABS: 1.0000 | ORAL_TABLET | Freq: Four times a day (QID) | ORAL | 0 refills | Status: DC | PRN

## 2018-07-18 MED ORDER — FENTANYL CITRATE (PF) 100 MCG/2ML IJ SOLN
100.0000 ug | Freq: Once | INTRAMUSCULAR | Status: AC
  Administered 2018-07-18: 100 ug via INTRAVENOUS
  Filled 2018-07-18: qty 2

## 2018-07-18 MED ORDER — IOPAMIDOL (ISOVUE-370) INJECTION 76%
75.0000 mL | Freq: Once | INTRAVENOUS | Status: AC | PRN
  Administered 2018-07-18: 75 mL via INTRAVENOUS

## 2018-07-18 MED ORDER — ASPIRIN EC 81 MG PO TBEC
81.0000 mg | DELAYED_RELEASE_TABLET | Freq: Once | ORAL | Status: AC
  Administered 2018-07-18: 81 mg via ORAL
  Filled 2018-07-18: qty 1

## 2018-07-18 MED ORDER — OXYCODONE-ACETAMINOPHEN 5-325 MG PO TABS
2.0000 | ORAL_TABLET | Freq: Once | ORAL | Status: AC
  Administered 2018-07-18: 2 via ORAL
  Filled 2018-07-18: qty 2

## 2018-07-18 MED ORDER — CLOPIDOGREL BISULFATE 75 MG PO TABS: 75.0000 mg | ORAL_TABLET | Freq: Every day | ORAL | 0 refills | Status: DC

## 2018-07-18 NOTE — Discharge Instructions (Signed)
Please begin taking both Plavix and a baby aspirin every single day for at least the next 3 months.  Follow-up with Dr. Marton Redwood as scheduled and make an appointment to follow-up with the vascular surgeons within 1 month for a recheck.  Return to the emergency department sooner for any concerns whatsoever.  It was a pleasure to take care of you today, and thank you for coming to our emergency department.  If you have any questions or concerns before leaving please ask the nurse to grab me and I'm more than happy to go through your aftercare instructions again.  If you were prescribed any opioid pain medication today such as Norco, Vicodin, Percocet, morphine, hydrocodone, or oxycodone please make sure you do not drive when you are taking this medication as it can alter your ability to drive safely.  If you have any concerns once you are home that you are not improving or are in fact getting worse before you can make it to your follow-up appointment, please do not hesitate to call [phone] and come back for further evaluation.  Darel Hong, MD  Results for orders placed or performed during the hospital encounter of 07/18/18  Comprehensive metabolic panel  Result Value Ref Range   Sodium 139 135 - 145 mmol/L   Potassium 3.9 3.5 - 5.1 mmol/L   Chloride 105 98 - 111 mmol/L   CO2 27 22 - 32 mmol/L   Glucose, Bld 249 (H) 70 - 99 mg/dL   BUN 17 6 - 20 mg/dL   Creatinine, Ser 0.82 0.44 - 1.00 mg/dL   Calcium 8.7 (L) 8.9 - 10.3 mg/dL   Total Protein 6.9 6.5 - 8.1 g/dL   Albumin 3.5 3.5 - 5.0 g/dL   AST 18 15 - 41 U/L   ALT 18 0 - 44 U/L   Alkaline Phosphatase 72 38 - 126 U/L   Total Bilirubin 0.4 0.3 - 1.2 mg/dL   GFR calc non Af Amer >60 >60 mL/min   GFR calc Af Amer >60 >60 mL/min   Anion gap 7 5 - 15  CBC with Differential  Result Value Ref Range   WBC 6.7 3.6 - 11.0 K/uL   RBC 4.17 3.80 - 5.20 MIL/uL   Hemoglobin 13.0 12.0 - 16.0 g/dL   HCT 38.4 35.0 - 47.0 %   MCV 92.1 80.0 - 100.0 fL   MCH 31.2 26.0 - 34.0 pg   MCHC 33.9 32.0 - 36.0 g/dL   RDW 14.2 11.5 - 14.5 %   Platelets 264 150 - 440 K/uL   Neutrophils Relative % 56 %   Neutro Abs 3.7 1.4 - 6.5 K/uL   Lymphocytes Relative 35 %   Lymphs Abs 2.3 1.0 - 3.6 K/uL   Monocytes Relative 8 %   Monocytes Absolute 0.5 0.2 - 0.9 K/uL   Eosinophils Relative 1 %   Eosinophils Absolute 0.1 0 - 0.7 K/uL   Basophils Relative 0 %   Basophils Absolute 0.0 0 - 0.1 K/uL  Troponin I  Result Value Ref Range   Troponin I <0.03 <0.03 ng/mL  Pregnancy, urine POC  Result Value Ref Range   Preg Test, Ur NEGATIVE NEGATIVE   Ct Angio Head W/cm &/or Wo Cm  Result Date: 07/18/2018 CLINICAL DATA:  Neck pain for a year, worsening today after physical therapy. History of skin cancer, stroke, diabetes, hypertension. EXAM: CT ANGIOGRAPHY HEAD AND NECK TECHNIQUE: Multidetector CT imaging of the head and neck was performed using the standard  protocol during bolus administration of intravenous contrast. Multiplanar CT image reconstructions and MIPs were obtained to evaluate the vascular anatomy. Carotid stenosis measurements (when applicable) are obtained utilizing NASCET criteria, using the distal internal carotid diameter as the denominator. CONTRAST:  51mL ISOVUE-370 IOPAMIDOL (ISOVUE-370) INJECTION 76% COMPARISON:  MRI of the head Mar 31, 2018, CTA HEAD and neck Mar 30, 2018. FINDINGS: CT HEAD FINDINGS BRAIN: No intraparenchymal hemorrhage, mass effect nor midline shift. The ventricles and sulci are normal. No acute large vascular territory infarcts. No abnormal extra-axial fluid collections. Basal cisterns are patent. VASCULAR: Unremarkable. SKULL/SOFT TISSUES: No skull fracture. Partially empty sella. No significant soft tissue swelling. ORBITS/SINUSES: The included ocular globes and orbital contents are normal.Trace paranasal sinus mucosal thickening. Mastoid air cells are well aerated. OTHER: None. CTA NECK FINDINGS: Large body habitus results in  overall noisy image quality. AORTIC ARCH: Normal appearance of the thoracic arch, 2 vessel arch is a normal variant. The origins of the innominate, left Common carotid artery and subclavian artery are widely patent. RIGHT CAROTID SYSTEM: Common carotid artery is patent. Normal appearance of the carotid bifurcation without hemodynamically significant stenosis by NASCET criteria. Normal appearance of the internal carotid artery. LEFT CAROTID SYSTEM: Common carotid artery is patent. Normal appearance of the carotid bifurcation without hemodynamically significant stenosis by NASCET criteria. Normal appearance of the internal carotid artery. VERTEBRAL ARTERIES:Patent vertebral arteries, limited assessment of luminal irregularity due to noisy image quality. Intermittent loss of demonstrable contrast enhancement bilateral mid V2 segments, segment unchanged. SKELETON: No acute osseous process though bone windows have not been submitted. Minimal calcific tendinopathy longus coli insertion. Moderate LEFT C4-5 neural foraminal narrowing. OTHER NECK: Soft tissues of the neck are nonacute though, not tailored for evaluation. UPPER CHEST: Included lung apices are clear. No superior mediastinal lymphadenopathy. CTA HEAD FINDINGS: ANTERIOR CIRCULATION: Patent cervical internal carotid arteries, petrous, cavernous and supra clinoid internal carotid arteries. Patent anterior communicating artery. Patent anterior and middle cerebral arteries. No large vessel occlusion, significant stenosis, contrast extravasation or aneurysm. POSTERIOR CIRCULATION: Patent vertebral arteries, vertebrobasilar junction and basilar artery, as well as main branch vessels. Patent posterior cerebral arteries. Robust RIGHT and small LEFT posterior communicating arteries present. No large vessel occlusion, significant stenosis, contrast extravasation or aneurysm. VENOUS SINUSES: Major dural venous sinuses are patent though not tailored for evaluation on this  angiographic examination. ANATOMIC VARIANTS: Hypoplastic LEFT A1 segment. Bilateral A2 segments predominately arise from RIGHT A1-2 junction. DELAYED PHASE: No abnormal intracranial enhancement. MIP images reviewed. IMPRESSION: CT HEAD: 1. Negative CT HEAD with and without contrast. 2. Partially empty sella. CTA NECK: 1. Similar poor characterization of vertebral arteries with poor contrast opacification V2 segment, potential chronic vertebral artery dissections without occlusion. 2. No hemodynamically significant stenosis ICA's. CTA HEAD: 1. No emergent large vessel occlusion or flow-limiting stenosis. Complete circle-of-Willis. Electronically Signed   By: Elon Alas M.D.   On: 07/18/2018 20:47   Ct Angio Neck W And/or Wo Contrast  Result Date: 07/18/2018 CLINICAL DATA:  Neck pain for a year, worsening today after physical therapy. History of skin cancer, stroke, diabetes, hypertension. EXAM: CT ANGIOGRAPHY HEAD AND NECK TECHNIQUE: Multidetector CT imaging of the head and neck was performed using the standard protocol during bolus administration of intravenous contrast. Multiplanar CT image reconstructions and MIPs were obtained to evaluate the vascular anatomy. Carotid stenosis measurements (when applicable) are obtained utilizing NASCET criteria, using the distal internal carotid diameter as the denominator. CONTRAST:  80mL ISOVUE-370 IOPAMIDOL (ISOVUE-370) INJECTION 76% COMPARISON:  MRI  of the head Mar 31, 2018, CTA HEAD and neck Mar 30, 2018. FINDINGS: CT HEAD FINDINGS BRAIN: No intraparenchymal hemorrhage, mass effect nor midline shift. The ventricles and sulci are normal. No acute large vascular territory infarcts. No abnormal extra-axial fluid collections. Basal cisterns are patent. VASCULAR: Unremarkable. SKULL/SOFT TISSUES: No skull fracture. Partially empty sella. No significant soft tissue swelling. ORBITS/SINUSES: The included ocular globes and orbital contents are normal.Trace paranasal sinus  mucosal thickening. Mastoid air cells are well aerated. OTHER: None. CTA NECK FINDINGS: Large body habitus results in overall noisy image quality. AORTIC ARCH: Normal appearance of the thoracic arch, 2 vessel arch is a normal variant. The origins of the innominate, left Common carotid artery and subclavian artery are widely patent. RIGHT CAROTID SYSTEM: Common carotid artery is patent. Normal appearance of the carotid bifurcation without hemodynamically significant stenosis by NASCET criteria. Normal appearance of the internal carotid artery. LEFT CAROTID SYSTEM: Common carotid artery is patent. Normal appearance of the carotid bifurcation without hemodynamically significant stenosis by NASCET criteria. Normal appearance of the internal carotid artery. VERTEBRAL ARTERIES:Patent vertebral arteries, limited assessment of luminal irregularity due to noisy image quality. Intermittent loss of demonstrable contrast enhancement bilateral mid V2 segments, segment unchanged. SKELETON: No acute osseous process though bone windows have not been submitted. Minimal calcific tendinopathy longus coli insertion. Moderate LEFT C4-5 neural foraminal narrowing. OTHER NECK: Soft tissues of the neck are nonacute though, not tailored for evaluation. UPPER CHEST: Included lung apices are clear. No superior mediastinal lymphadenopathy. CTA HEAD FINDINGS: ANTERIOR CIRCULATION: Patent cervical internal carotid arteries, petrous, cavernous and supra clinoid internal carotid arteries. Patent anterior communicating artery. Patent anterior and middle cerebral arteries. No large vessel occlusion, significant stenosis, contrast extravasation or aneurysm. POSTERIOR CIRCULATION: Patent vertebral arteries, vertebrobasilar junction and basilar artery, as well as main branch vessels. Patent posterior cerebral arteries. Robust RIGHT and small LEFT posterior communicating arteries present. No large vessel occlusion, significant stenosis, contrast  extravasation or aneurysm. VENOUS SINUSES: Major dural venous sinuses are patent though not tailored for evaluation on this angiographic examination. ANATOMIC VARIANTS: Hypoplastic LEFT A1 segment. Bilateral A2 segments predominately arise from RIGHT A1-2 junction. DELAYED PHASE: No abnormal intracranial enhancement. MIP images reviewed. IMPRESSION: CT HEAD: 1. Negative CT HEAD with and without contrast. 2. Partially empty sella. CTA NECK: 1. Similar poor characterization of vertebral arteries with poor contrast opacification V2 segment, potential chronic vertebral artery dissections without occlusion. 2. No hemodynamically significant stenosis ICA's. CTA HEAD: 1. No emergent large vessel occlusion or flow-limiting stenosis. Complete circle-of-Willis. Electronically Signed   By: Elon Alas M.D.   On: 07/18/2018 20:47   Mr Shoulder Right Wo Contrast  Result Date: 07/04/2018 CLINICAL DATA:  Right shoulder pain and decreased range of motion since January 2019. EXAM: MRI OF THE RIGHT SHOULDER WITHOUT CONTRAST TECHNIQUE: Multiplanar, multisequence MR imaging of the shoulder was performed. No intravenous contrast was administered. COMPARISON:  None. FINDINGS: Rotator cuff: No discrete tears of the rotator cuff. Focal cystic degenerative changes in the anterior aspect of the greater tuberosity adjacent to the distal supraspinatus insertion. I do not think the signal abnormalities are within the tendon itself. Muscles: No atrophy or abnormal signal of the muscles of the rotator cuff. Biceps long head:  Properly located and intact. Acromioclavicular Joint: Slight AC joint arthropathy. Type 2 acromion. Subtle inflammation of the subdeltoid bursa. Glenohumeral Joint: No joint effusion. No chondral defect. Labrum:  Intact. Bones: Focal cystic degenerative changes in the greater tuberosity of the proximal humerus. Otherwise negative.  Other: None IMPRESSION: 1. Slight inflammation of the subdeltoid bursa. 2. Cystic  degenerative changes in the greater tuberosity at the supraspinatus insertion. 3. No discrete rotator cuff tear. 4. Slight AC joint arthropathy. Electronically Signed   By: Lorriane Shire M.D.   On: 07/04/2018 08:37   Mr Shoulder Left Wo Contrast  Result Date: 07/04/2018 CLINICAL DATA:  Left shoulder pain for 2 years. She fell in November 2018. Previous rotator cuff repair. Previous biceps tenotomy. EXAM: MRI OF THE LEFT SHOULDER WITHOUT CONTRAST TECHNIQUE: Multiplanar, multisequence MR imaging of the shoulder was performed. No intravenous contrast was administered. COMPARISON:  MRIs dated 04/12/2017 and 11/11/2016 FINDINGS: Rotator cuff: The repaired rotator cuff is intact. Chronic tendinopathy of the articular surface of the distal supraspinatus tendon, less prominent than on the prior exams. Muscles: No atrophy or abnormal signal of the muscles of the rotator cuff. Biceps long head:  Not visualized.  Previous biceps tenotomy. Acromioclavicular Joint: Moderate AC joint arthropathy. Complete resolution of edema in the acromion and distal clavicle as well as resolution of the edema around the [facility] joint since the prior study. Type 3 acromion, best seen on image 12 of series 6. Almost complete resolution of the fluid subdeltoid bursa with only a tiny amount fluid seen in the posterior aspect of the bursa. Glenohumeral Joint: No joint effusion. No chondral defect. Labrum:  Intact. Bones: Anchors in the humeral head from prior rotator cuff repair. Otherwise negative. Other: None IMPRESSION: 1. No evidence of recurrent rotator cuff tear. The partial-thickness bursal surface tear described on the prior study is not apparent to me on this exam. 2. Resolution of inflammation in and around the acromioclavicular joint since the prior study. 3. Almost complete resolution of bursal fluid since the prior study. 4. No new abnormalities. Electronically Signed   By: Lorriane Shire M.D.   On: 07/04/2018 08:47   Dg Chest Port 1  View  Result Date: 07/18/2018 CLINICAL DATA:  Left-sided chest pain EXAM: PORTABLE CHEST 1 VIEW COMPARISON:  03/29/2018 FINDINGS: The heart size and mediastinal contours are within normal limits. Both lungs are clear. The visualized skeletal structures are unremarkable. IMPRESSION: No active disease. Electronically Signed   By: Inez Catalina M.D.   On: 07/18/2018 19:34   Dg Knee Complete 4 Views Left  Result Date: 06/21/2018 CLINICAL DATA:  Knee pain EXAM: LEFT KNEE - COMPLETE 4+ VIEW COMPARISON:  None. FINDINGS: Mild osteophyte formation at the margins of the femorotibial joint space. No fracture or dislocation. Prepatellar soft tissue swelling without joint effusion. IMPRESSION: Mild left knee degenerative change without acute abnormality. Electronically Signed   By: Ulyses Jarred M.D.   On: 06/21/2018 14:38

## 2018-07-18 NOTE — ED Triage Notes (Signed)
Pt c/o left sided neck pain after going to therapy today. States pain radiates down into her chest and has given her chest pain.   Pt alert and oriented X4, active, cooperative, pt in NAD. RR even and unlabored, color WNL.

## 2018-07-18 NOTE — ED Provider Notes (Signed)
[facility] Emergency Department Provider Note  ____________________________________________   First MD Initiated Contact with Patient 07/18/18 Bosie Helper     (approximate)  I have reviewed the triage vital signs and the nursing notes.   HISTORY  Chief Complaint Neck Pain and Chest Pain   HPI Brenda Hicks is a 52 y.o. female who self presents to the emergency department with sudden onset severe left neck pain radiating from her left lateral neck towards her lateral left head and down the left side of her neck that began suddenly today while she was at physical therapy.  She does have a known herniated disc in her neck and she was at physical therapy receiving treatment.  She had some sort of manipulation and had sudden onset severe symptoms.  She also reports some numbness and tingling radiating down the dorsal and lateral aspect of her left arm.  She denies weakness.  She denies double vision or blurry vision.  The pain does seem to radiate towards her left upper chest.  Nothing seems to make the symptoms better or worse.  She has stiffness and difficulty moving her head.  The symptoms were sudden onset severe sharp and electric.  Past Medical History:  Diagnosis Date  . Arthritis   . Asthma    WELL CONTROLLED  . Cancer of ear   . Diabetes mellitus without complication ([place])   . Fatty liver   . Hypertension   . Kidney cysts   . Renal disorder   . Sleep apnea    USES CPAP  . Stroke [facility])     Patient Active Problem List   Diagnosis Date Noted  . 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 Ischemic chest pain 03/29/2018  . Chest pain 03/29/2018  . Sepsis affecting skin 06/09/2017  . Sebaceous cyst 06/01/2016  . Obstructive apnea 05/31/2016  . HLD (hyperlipidemia) 05/31/2016  . Acid reflux 05/31/2016  . Essential (primary) hypertension 05/31/2016  . Abnormal Pap smear of cervix 05/31/2016  . Arthritis of knee, degenerative 07/25/2015  . History of  artificial joint 07/25/2015  . Gonalgia 02/17/2015  . Type 2 diabetes mellitus ([place]) 10/23/2014  . Otalgia of left ear 09/24/2014  . 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 Diabetic peripheral neuropathy associated with type 2 diabetes mellitus ([place]) 05/16/2014  . Endometrial polyp 11/01/2013  . Fibroids, intramural 10/09/2013  . Pain due to knee joint prosthesis ([place]) 10/05/2013  . Body mass index (BMI) of 50-59.9 in adult ([place]) 10/03/2013  . Abnormal uterine bleeding 10/03/2013  . Adiposity 10/01/2013  . Excessive and frequent menstruation with irregular cycle 10/01/2013  . Adaptive colitis 10/01/2013  . History of migraine headaches 10/01/2013  . H/O malignant neoplasm of skin 10/01/2013  . Fatty liver disease, nonalcoholic 86/76/7209  . Diverticulitis 10/01/2013  . Current tobacco use 08/29/2013  . H/O total knee replacement 08/29/2013  . Cannot sleep 08/29/2013  . Dysmenorrhea 08/29/2013  . Airway hyperreactivity 08/29/2013  . Absolute anemia 08/29/2013  . Tobacco use 08/29/2013    Past Surgical History:  Procedure Laterality Date  . DILATION AND CURETTAGE OF UTERUS    . ENDOMETRIAL BIOPSY    . EXPLORATORY LAPAROTOMY     REMOVAL OF RUPTURED ECTOPIC  . HAND SURGERY    . HERNIA REPAIR     UMBILICAL  . JOINT REPLACEMENT Right    TKR  . KNEE ARTHROSCOPY    . KNEE SURGERY Right   .  SHOULDER ARTHROSCOPY WITH BICEPSTENOTOMY Left 12/14/2016   Procedure: SHOULDER ARTHROSCOPY WITH BICEPSTENOTOMY;  Surgeon: Corky Mull, MD;  Location: [facility] ORS;  Service: Orthopedics;  Laterality: Left;  . SHOULDER ARTHROSCOPY WITH OPEN ROTATOR CUFF REPAIR Left 12/14/2016   Procedure: SHOULDER ARTHROSCOPY WITH OPEN ROTATOR CUFF REPAIR AND ARTHROSCOPIC ROTATOR CUFF REPAIR;  Surgeon: Corky Mull, MD;  Location: [facility] ORS;  Service: Orthopedics;  Laterality: Left;  . SHOULDER ARTHROSCOPY WITH SUBACROMIAL  DECOMPRESSION Left 12/14/2016   Procedure: SHOULDER ARTHROSCOPY WITH SUBACROMIAL DECOMPRESSION;  Surgeon: Corky Mull, MD;  Location: [facility] ORS;  Service: Orthopedics;  Laterality: Left;  . TUBAL LIGATION      Prior to Admission medications   Medication Sig Start Date End Date Taking? Authorizing Provider  albuterol (PROVENTIL HFA;VENTOLIN HFA) 108 (90 Base) MCG/ACT inhaler Inhale 2 puffs into the lungs every 6 (six) hours as needed for wheezing or shortness of breath.    [provider]  aspirin EC 81 MG tablet Take 81 mg by mouth daily.  01/14/16   [provider]  atorvastatin (LIPITOR) 40 MG tablet Take 1 tablet (40 mg total) by mouth daily. 04/01/18 07/22/20  Nicholes Mango, MD  clopidogrel (PLAVIX) 75 MG tablet Take 1 tablet (75 mg total) by mouth daily. 07/18/18 10/16/18  Darel Hong, MD  docusate sodium (COLACE) 100 MG capsule Take 1 capsule (100 mg total) by mouth 2 (two) times daily as needed for mild constipation. 04/01/18   Nicholes Mango, MD  Elastic Bandages & Supports (MEDICAL COMPRESSION STOCKINGS) MISC 2 medium stockings Use as directed on both lower legs 01/30/18   Arta Silence, MD  FLOVENT HFA 220 MCG/ACT inhaler Inhale 2 puffs into the lungs 2 (two) times daily. 01/25/18   [provider]  furosemide (LASIX) 40 MG tablet Take 1 tablet by mouth daily. 01/31/18   [provider]  gabapentin (NEURONTIN) 300 MG capsule Take 300 mg by mouth 3 (three) times daily.     [provider]  HYDROmorphone (DILAUDID) 2 MG tablet Take 1 tablet (2 mg total) by mouth every 12 (twelve) hours as needed for severe pain (For pain unrelieved by Percocet). 07/19/18 07/19/19  Earleen Newport, MD  insulin aspart (NOVOLOG) 100 UNIT/ML injection Inject 10 Units into the skin 3 (three) times daily with meals. 04/01/18   Gouru, Illene Silver, MD  insulin glargine (LANTUS) 100 UNIT/ML injection Inject 0.2 mLs (20 Units total) into the skin daily. 04/02/18   Nicholes Mango,  MD  ketorolac (TORADOL) 10 MG tablet Take 1 tablet (10 mg total) by mouth every 6 (six) hours as needed. 06/21/18   Laban Emperor, PA-C  lisinopril (PRINIVIL,ZESTRIL) 40 MG tablet Take 40 mg by mouth every evening.  01/14/16 03/29/18  [provider]  metFORMIN (GLUCOPHAGE) 1000 MG tablet Take 1 tablet by mouth 2 (two) times daily. 01/25/18   [provider]  orphenadrine (NORFLEX) 100 MG tablet Take 1 tablet (100 mg total) by mouth 2 (two) times daily as needed for muscle spasms. 11/22/17   Johnn Hai, PA-C  oxyCODONE-acetaminophen (PERCOCET) 10-325 MG tablet Take 1 tablet by mouth every 6 (six) hours as needed for pain. 07/18/18 07/18/19  Darel Hong, MD  potassium chloride (K-DUR) 10 MEQ tablet Take 1 tablet by mouth daily. 01/31/18   [provider]  predniSONE (STERAPRED UNI-PAK 21 TAB) 10 MG (21) TBPK tablet Take by mouth daily. Dispense steroid taper pack as directed 07/19/18   Earleen Newport, MD  promethazine (PHENERGAN) 25 MG tablet  Take 1 tablet (25 mg total) by mouth every 6 (six) hours as needed for nausea or vomiting. 07/19/18   Earleen Newport, MD  sitaGLIPtin (JANUVIA) 100 MG tablet Take 100 mg by mouth daily. Patient reports taking Januvia 100 mg daily    [provider]  STIOLTO RESPIMAT 2.5-2.5 MCG/ACT AERS Take 2 puffs by mouth daily. 01/26/18   [provider]  tiZANidine (ZANAFLEX) 4 MG tablet Take 1 tablet by mouth every 8 (eight) hours as needed for muscle spasms.  01/25/18   [provider]  traZODone (DESYREL) 50 MG tablet Take 1 tablet by mouth at bedtime. 01/25/18   [provider]  zolpidem (AMBIEN) 5 MG tablet Take 1 tablet (5 mg total) by mouth at bedtime as needed for sleep. Patient not taking: Reported on 01/30/2018 12/14/16   Poggi, Marshall Cork, MD    Allergies Patient has no known allergies.  Family History  Problem Relation Age of Onset  . CAD Maternal Grandmother     Social History Social History    Tobacco Use  . Smoking status: Former Smoker    Packs/day: 2.00    Years: 25.00    Pack years: 50.00    Types: Cigarettes  . Smokeless tobacco: Never Used  Substance Use Topics  . Alcohol use: No  . Drug use: No    Review of Systems Constitutional: No fever/chills Eyes: No visual changes. ENT: No sore throat. Cardiovascular: Positive for chest pain. Respiratory: Denies shortness of breath. Gastrointestinal: No abdominal pain.  No nausea, no vomiting.  No diarrhea.  No constipation. Genitourinary: Negative for dysuria. Musculoskeletal: Negative for back pain. Skin: Negative for rash. Neurological: Positive for radicular numbness and for headache   ____________________________________________   PHYSICAL EXAM:  VITAL SIGNS: ED Triage Vitals  Enc Vitals Group     BP 07/18/18 1837 108/70     Pulse Rate 07/18/18 1837 89     Resp 07/18/18 1837 18     Temp 07/18/18 1837 98.7 F (37.1 C)     Temp Source 07/18/18 1837 Oral     SpO2 07/18/18 1837 100 %     Weight 07/18/18 1838 (!) 321 lb 14 oz (146 kg)     Height 07/18/18 1838 5\' 6"  (1.676 m)     Head Circumference --      Peak Flow --      Pain Score 07/18/18 1838 9     Pain Loc --      Pain Edu? --      Excl. in [place]? --     Constitutional: Alert and oriented x4 appears quite uncomfortable not moving her head very much and appears very stiff Eyes: PERRL EOMI. midrange and brisk Head: Atraumatic. Nose: No congestion/rhinnorhea.  No bruit appreciated Mouth/Throat: No trismus Neck: No stridor.  Quite tender paraspinal left greater than right Cardiovascular: Normal rate, regular rhythm. Grossly normal heart sounds.  Good peripheral circulation. Respiratory: Normal respiratory effort.  No retractions. Lungs CTAB and moving good air Gastrointestinal: Soft nontender Musculoskeletal: No lower extremity edema   Neurologic:  Normal speech and language.  No signs of Horner's syndrome No pronator drift 5 out of 5 grips  biceps triceps Remainder of neurological exam unremarkable Skin:  Skin is warm, dry and intact. No rash noted. Psychiatric: Mood and affect are normal. Speech and behavior are normal.    ____________________________________________   DIFFERENTIAL includes but not limited to  Vertebral artery dissection, carotid artery dissection, muscle strain, herniated disc, radicular pain  ____________________________________________   LABS (all labs ordered are listed, but only abnormal results are displayed)  Labs Reviewed  COMPREHENSIVE METABOLIC PANEL - Abnormal; Notable for the following components:      Result Value   Glucose, Bld 249 (*)    Calcium 8.7 (*)    All other components within normal limits  CBC WITH DIFFERENTIAL/PLATELET  TROPONIN I  POCT PREGNANCY, URINE    Lab work reviewed by me with elevated sugar otherwise unremarkable __________________________________________  EKG  ED ECG REPORT I, Darel Hong, the attending physician, personally viewed and interpreted this ECG.  Date: 07/19/2018 EKG Time:  Rate: 86 Rhythm: normal sinus rhythm QRS Axis: normal Intervals: normal ST/T Wave abnormalities: normal Narrative Interpretation: no evidence of acute ischemia  ____________________________________________  RADIOLOGY  CT angiogram the head neck reviewed by me nonspecific but could be consistent with vertebral artery dissection ____________________________________________   PROCEDURES  Procedure(s) performed: no  Procedures  Critical Care performed: no  ____________________________________________   INITIAL IMPRESSION / ASSESSMENT AND PLAN / ED COURSE  Pertinent labs & imaging results that were available during my care of the patient were reviewed by me and considered in my medical decision making (see chart for details).   As part of my medical decision making, I reviewed the following data within the [place] History obtained from  family if available, nursing notes, old chart and ekg, as well as notes from prior ED visits.  The patient arrives quite uncomfortable appearing with sudden onset severe left neck pain after having manipulation and physical therapy.  She does have what sounds like some radicular symptoms down her left arm however no motor weakness.  Given 100 mcg of IV fentanyl 5 mg of oral Valium for pain control we will send her off to CT angiogram.     ----------------------------------------- 9:00 PM on 07/18/2018 -----------------------------------------  The patient CT scan is difficult to interpret secondary to habitus however could be consistent with chronic vertebral artery dissections.  I discussed the case with on-call vascular surgeon Dr. Delana Meyer who recommends both aspirin and Plavix if this were a dissection and follow-up in 3 months for repeat angiogram.  The patient's pain is significantly improved.  I will give her a first dose of aspirin Plavix here today as well as 2 more tablets of Percocet for continued pain control.  Again she has a normal motor exam and no indication for emergent MRI at this point.  She is under the care of Dr. Roland Rack of orthopedic surgery and I will refer her back.  Strict return precautions have been given. ____________________________________________   FINAL CLINICAL IMPRESSION(S) / ED DIAGNOSES  Final diagnoses:  Vertebral artery dissection (HCC)  Neck pain      NEW MEDICATIONS STARTED DURING THIS VISIT:  Discharge Medication List as of 07/18/2018  9:08 PM    START taking these medications   Details  oxyCODONE-acetaminophen (PERCOCET) 10-325 MG tablet Take 1 tablet by mouth every 6 (six) hours as needed for pain., Starting Tue 07/18/2018, Until Wed 07/18/2019, Print    clopidogrel (PLAVIX) 75 MG tablet Take 1 tablet (75 mg total) by mouth daily., Starting Tue 07/18/2018, Until Mon 10/16/2018, Normal         Note:  This document was prepared using Dragon  voice recognition software and may include unintentional dictation errors.     Darel Hong, MD 07/19/18 2156

## 2018-07-18 NOTE — ED Notes (Signed)
Patient transported to CT 

## 2018-07-19 ENCOUNTER — Emergency Department
Admission: EM | Admit: 2018-07-19 | Discharge: 2018-07-19 | Disposition: A | Discharge status: Home / Self Care | Payer: Medicaid Other | Attending: Emergency Medicine | Admitting: Emergency Medicine

## 2018-07-19 ENCOUNTER — Encounter: Payer: Self-pay | Admitting: Emergency Medicine

## 2018-07-19 ENCOUNTER — Emergency Department: Payer: Medicaid Other

## 2018-07-19 DIAGNOSIS — Z87891 Personal history of nicotine dependence: Secondary | ICD-10-CM | POA: Insufficient documentation

## 2018-07-19 DIAGNOSIS — J45909 Unspecified asthma, uncomplicated: Secondary | ICD-10-CM | POA: Diagnosis not present

## 2018-07-19 DIAGNOSIS — E1140 Type 2 diabetes mellitus with diabetic neuropathy, unspecified: Secondary | ICD-10-CM | POA: Insufficient documentation

## 2018-07-19 DIAGNOSIS — Z96651 Presence of right artificial knee joint: Secondary | ICD-10-CM | POA: Diagnosis not present

## 2018-07-19 DIAGNOSIS — M542 Cervicalgia: Secondary | ICD-10-CM | POA: Diagnosis present

## 2018-07-19 DIAGNOSIS — M5030 Other cervical disc degeneration, unspecified cervical region: Secondary | ICD-10-CM | POA: Diagnosis not present

## 2018-07-19 LAB — CBC WITH DIFFERENTIAL/PLATELET
Basophils Absolute: 0 10*3/uL (ref 0–0.1)
Basophils Relative: 1 %
Eosinophils Absolute: 0.1 10*3/uL (ref 0–0.7)
Eosinophils Relative: 1 %
HCT: 38.8 % (ref 35.0–47.0)
HEMOGLOBIN: 13.1 g/dL (ref 12.0–16.0)
LYMPHS ABS: 1.8 10*3/uL (ref 1.0–3.6)
LYMPHS PCT: 30 %
MCH: 30.9 pg (ref 26.0–34.0)
MCHC: 33.6 g/dL (ref 32.0–36.0)
MCV: 92.1 fL (ref 80.0–100.0)
MONOS PCT: 7 %
Monocytes Absolute: 0.4 10*3/uL (ref 0.2–0.9)
NEUTROS PCT: 61 %
Neutro Abs: 3.8 10*3/uL (ref 1.4–6.5)
Platelets: 261 10*3/uL (ref 150–440)
RBC: 4.22 MIL/uL (ref 3.80–5.20)
RDW: 14.2 % (ref 11.5–14.5)
WBC: 6.2 10*3/uL (ref 3.6–11.0)

## 2018-07-19 LAB — COMPREHENSIVE METABOLIC PANEL
ALT: 19 U/L (ref 0–44)
ANION GAP: 7 (ref 5–15)
AST: 19 U/L (ref 15–41)
Albumin: 3.7 g/dL (ref 3.5–5.0)
Alkaline Phosphatase: 71 U/L (ref 38–126)
BUN: 13 mg/dL (ref 6–20)
CO2: 28 mmol/L (ref 22–32)
CREATININE: 0.75 mg/dL (ref 0.44–1.00)
Calcium: 8.6 mg/dL — ABNORMAL LOW (ref 8.9–10.3)
Chloride: 104 mmol/L (ref 98–111)
Glucose, Bld: 160 mg/dL — ABNORMAL HIGH (ref 70–99)
Potassium: 4 mmol/L (ref 3.5–5.1)
SODIUM: 139 mmol/L (ref 135–145)
Total Bilirubin: 0.4 mg/dL (ref 0.3–1.2)
Total Protein: 7.2 g/dL (ref 6.5–8.1)

## 2018-07-19 LAB — SEDIMENTATION RATE: Sed Rate: 33 mm/hr — ABNORMAL HIGH (ref 0–30)

## 2018-07-19 MED ORDER — HYDROMORPHONE HCL 1 MG/ML IJ SOLN
1.0000 mg | Freq: Once | INTRAMUSCULAR | Status: AC
  Administered 2018-07-19: 1 mg via INTRAVENOUS
  Filled 2018-07-19: qty 1

## 2018-07-19 MED ORDER — GADOBENATE DIMEGLUMINE 529 MG/ML IV SOLN
20.0000 mL | Freq: Once | INTRAVENOUS | Status: AC | PRN
  Administered 2018-07-19: 20 mL via INTRAVENOUS

## 2018-07-19 MED ORDER — HYDROMORPHONE HCL 2 MG PO TABS: 2.0000 mg | ORAL_TABLET | Freq: Two times a day (BID) | ORAL | 0 refills | Status: DC | PRN

## 2018-07-19 MED ORDER — ONDANSETRON HCL 4 MG/2ML IJ SOLN
4.0000 mg | Freq: Once | INTRAMUSCULAR | Status: AC
  Administered 2018-07-19: 4 mg via INTRAVENOUS
  Filled 2018-07-19: qty 2

## 2018-07-19 MED ORDER — PREDNISONE 10 MG (21) PO TBPK: ORAL_TABLET | Freq: Every day | ORAL | 0 refills | Status: DC

## 2018-07-19 MED ORDER — PROMETHAZINE HCL 25 MG PO TABS: 25.0000 mg | ORAL_TABLET | Freq: Four times a day (QID) | ORAL | 0 refills | Status: DC | PRN

## 2018-07-19 MED ORDER — METHYLPREDNISOLONE SODIUM SUCC 125 MG IJ SOLR
125.0000 mg | Freq: Once | INTRAMUSCULAR | Status: AC
Start: 2018-07-19 — End: 2018-07-19
  Administered 2018-07-19: 125 mg via INTRAVENOUS
  Filled 2018-07-19: qty 2

## 2018-07-19 NOTE — ED Notes (Signed)
Pt on phone with MRI

## 2018-07-19 NOTE — ED Notes (Signed)
Patient transported to MRI 

## 2018-07-19 NOTE — ED Notes (Addendum)
Pt had Iv in Left hand that didn't flush. EDP Williams placed a 22 EJ in the right side of the neck. Labs sent at this time. Lab notified of tubes sent at this time.

## 2018-07-19 NOTE — ED Provider Notes (Signed)
[facility] Emergency Department Provider Note       Time seen: ----------------------------------------- 8:44 AM on 07/19/2018 -----------------------------------------   I have reviewed the triage vital signs and the nursing notes.  HISTORY   Chief Complaint Torticollis; Shoulder Pain; and Back Pain    HPI Brenda Hicks is a 52 y.o. female with a history of thrombus, asthma, diabetes, hypertension, renal disorder, CVA who presents to the ED for recurrence and worsening of her neck pain.  Patient was seen here yesterday for the same and told she had a potential vertebral artery dissection in her neck and to come back of her pain got worse.  Patient reports the pain got much worse and was unrelieved by medication.  She was supposed to follow-up with vascular surgery in 1 month.  She denies any new numbness or weakness.  Reports she has neck pain that radiates down her arms and also has a headache.  Past Medical History:  Diagnosis Date  . Arthritis   . Asthma    WELL CONTROLLED  . Cancer of ear   . Diabetes mellitus without complication ([place])   . Fatty liver   . Hypertension   . Kidney cysts   . Renal disorder   . Sleep apnea    USES CPAP  . Stroke [facility])     Patient Active Problem List   Diagnosis Date Noted  . 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 Ischemic chest pain 03/29/2018  . Chest pain 03/29/2018  . Sepsis affecting skin 06/09/2017  . Sebaceous cyst 06/01/2016  . Obstructive apnea 05/31/2016  . HLD (hyperlipidemia) 05/31/2016  . Acid reflux 05/31/2016  . Essential (primary) hypertension 05/31/2016  . Abnormal Pap smear of cervix 05/31/2016  . Arthritis of knee, degenerative 07/25/2015  . History of artificial joint 07/25/2015  . Gonalgia 02/17/2015  . Type 2 diabetes mellitus ([place]) 10/23/2014  . Otalgia of left ear 09/24/2014  . Mixed conductive and sensorineural hearing loss, unilateral with unrestricted hearing on the  contralateral side 09/24/2014  . Diabetic peripheral neuropathy associated with type 2 diabetes mellitus ([place]) 05/16/2014  . Endometrial polyp 11/01/2013  . Fibroids, intramural 10/09/2013  . Pain due to knee joint prosthesis ([place]) 10/05/2013  . Body mass index (BMI) of 50-59.9 in adult ([place]) 10/03/2013  . Abnormal uterine bleeding 10/03/2013  . Adiposity 10/01/2013  . Excessive and frequent menstruation with irregular cycle 10/01/2013  . Adaptive colitis 10/01/2013  . History of migraine headaches 10/01/2013  . H/O malignant neoplasm of skin 10/01/2013  . Fatty liver disease, nonalcoholic 56/43/3295  . Diverticulitis 10/01/2013  . Current tobacco use 08/29/2013  . H/O total knee replacement 08/29/2013  . Cannot sleep 08/29/2013  . Dysmenorrhea 08/29/2013  . Airway hyperreactivity 08/29/2013  . Absolute anemia 08/29/2013  . Tobacco use 08/29/2013    Past Surgical History:  Procedure Laterality Date  . DILATION AND CURETTAGE OF UTERUS    . ENDOMETRIAL BIOPSY    . EXPLORATORY LAPAROTOMY     REMOVAL OF RUPTURED ECTOPIC  . HAND SURGERY    . HERNIA REPAIR     UMBILICAL  . JOINT REPLACEMENT Right    TKR  . KNEE ARTHROSCOPY    . KNEE SURGERY Right   . SHOULDER ARTHROSCOPY WITH BICEPSTENOTOMY Left 12/14/2016   Procedure: SHOULDER ARTHROSCOPY WITH BICEPSTENOTOMY;  Surgeon: Corky Mull, MD;  Location: [facility] ORS;  Service: Orthopedics;  Laterality: Left;  . SHOULDER ARTHROSCOPY WITH OPEN ROTATOR CUFF REPAIR Left 12/14/2016   Procedure: SHOULDER  ARTHROSCOPY WITH OPEN ROTATOR CUFF REPAIR AND ARTHROSCOPIC ROTATOR CUFF REPAIR;  Surgeon: Corky Mull, MD;  Location: [facility] ORS;  Service: Orthopedics;  Laterality: Left;  . SHOULDER ARTHROSCOPY WITH SUBACROMIAL DECOMPRESSION Left 12/14/2016   Procedure: SHOULDER ARTHROSCOPY WITH SUBACROMIAL DECOMPRESSION;  Surgeon: Corky Mull, MD;  Location: [facility] ORS;  Service: Orthopedics;  Laterality: Left;  . TUBAL LIGATION      Allergies Patient has no  known allergies.  Social History Social History   Tobacco Use  . Smoking status: Former Smoker    Packs/day: 2.00    Years: 25.00    Pack years: 50.00    Types: Cigarettes  . Smokeless tobacco: Never Used  Substance Use Topics  . Alcohol use: No  . Drug use: No   Review of Systems Constitutional: Negative for fever. Cardiovascular: Negative for chest pain. Respiratory: Negative for shortness of breath. Gastrointestinal: Negative for abdominal pain, vomiting and diarrhea. Musculoskeletal: Positive for neck pain Skin: Negative for rash. Neurological: Positive for headache  All systems negative/normal/unremarkable except as stated in the HPI  ____________________________________________   PHYSICAL EXAM:  VITAL SIGNS: ED Triage Vitals  Enc Vitals Group     BP 07/19/18 0828 (!) 145/92     Pulse Rate 07/19/18 0828 83     Resp 07/19/18 0828 20     Temp 07/19/18 0828 97.6 F (36.4 C)     Temp Source 07/19/18 0828 Oral     SpO2 07/19/18 0828 100 %     Weight 07/19/18 0829 (!) 334 lb (151.5 kg)     Height 07/19/18 0829 5\' 5"  (1.651 m)     Head Circumference --      Peak Flow --      Pain Score 07/19/18 0829 10     Pain Loc --      Pain Edu? --      Excl. in [place]? --    Constitutional: Alert and oriented. Well appearing and in no distress. Eyes: Conjunctivae are normal. Normal extraocular movements. ENT   Head: Normocephalic and atraumatic.   Nose: No congestion/rhinnorhea.   Mouth/Throat: Mucous membranes are moist.   Neck: No stridor. Cardiovascular: Normal rate, regular rhythm. No murmurs, rubs, or gallops. Respiratory: Normal respiratory effort without tachypnea nor retractions. Breath sounds are clear and equal bilaterally. No wheezes/rales/rhonchi. Gastrointestinal: Soft and nontender. Normal bowel sounds Musculoskeletal: Nontender with normal range of motion in extremities. No lower extremity tenderness nor edema. Neurologic:  Normal speech and  language. No gross focal neurologic deficits are appreciated.  Skin:  Skin is warm, dry and intact. No rash noted. Psychiatric: Mood and affect are normal. Speech and behavior are normal.  ____________________________________________  ED COURSE:  As part of my medical decision making, I reviewed the following data within the [place] History obtained from family if available, nursing notes, old chart and ekg, as well as notes from prior ED visits. Patient presented for worsening neck pain, we will assess with labs and imaging as indicated at this time.   Procedures ____________________________________________   LABS (pertinent positives/negatives)  Labs Reviewed  COMPREHENSIVE METABOLIC PANEL - Abnormal; Notable for the following components:      Result Value   Glucose, Bld 160 (*)    Calcium 8.6 (*)    All other components within normal limits  SEDIMENTATION RATE - Abnormal; Notable for the following components:   Sed Rate 33 (*)    All other components within normal limits  CBC WITH DIFFERENTIAL/PLATELET  RADIOLOGY Images were viewed by me  MRI cervical spine IMPRESSION: 1. Cervical spondylosis is most severe at C4-5 with a leftward disc osteophyte complex resulting in moderate left central canal stenosis with severe left and moderate right foraminal narrowing. 2. Focal ventral T2 cord signal abnormality at the C4-5 level. 3. Mild foraminal narrowing bilaterally at C3-4 and C5-6 due to uncovertebral and facet disease. ____________________________________________  DIFFERENTIAL DIAGNOSIS   Degenerative disc disease, chronic pain, herniated disc, vertebral artery dissection  FINAL ASSESSMENT AND PLAN  Degenerative disc disease, herniated disc   Plan: The patient had presented for worsening neck pain. Patient's labs did not reveal any acute process and were reassuring. Patient's imaging revealed significant C4-C5 spondylosis, disc osteophyte  complex and canal stenosis.  She was given pain medicine as well as steroids here.  I reviewed her CTs from yesterday which showed perhaps a chronic dissection but nothing acute.  She does not have any new neurologic complaints and is mainly just complaining of pain.  MRI has worsened somewhat since June of last year.  I will refer her to neurosurgery for outpatient follow-up.   Laurence Aly, MD   Note: This note was generated in part or whole with voice recognition software. Voice recognition is usually quite accurate but there are transcription errors that can and very often do occur. I apologize for any typographical errors that were not detected and corrected.     Earleen Newport, MD 07/19/18 [phone]

## 2018-07-19 NOTE — ED Triage Notes (Signed)
Pt reports was seen here yesterday for the same and told she had an artery dissection in her neck and if the pain got worse to come back. Pt reports the pain is much worse unrelieved with medication.

## 2018-07-19 NOTE — ED Notes (Signed)
Pt has had blood draw attempted by 5 different ppl. Per Policy Lab was notified of the need for blood draw. Pt has an established IV in the Left Hand that will not draw back blood for lab work.

## 2018-07-19 NOTE — ED Notes (Addendum)
Pt up to toilet at this time. Pt complains of nausea at this time and given emesis bag.

## 2018-07-19 NOTE — ED Notes (Signed)
Scanner in room not working IT contacted by this RN to resolve issue

## 2018-08-01 IMAGING — US US ABDOMEN LIMITED
1 series · 14 of 25 positions shown · non-contrast
Comparison: Abdominal ultrasound performed 12/02/2005, and CT of
the abdomen and pelvis performed 07/09/2013

CLINICAL DATA: Acute onset of right upper quadrant abdominal pain
and vomiting. Initial encounter.

EXAM:
US ABDOMEN LIMITED - RIGHT UPPER QUADRANT

[Series 1: us abdomen limited · 0.24mm/px · 14 of 46 slices shown]
[im 1/46]
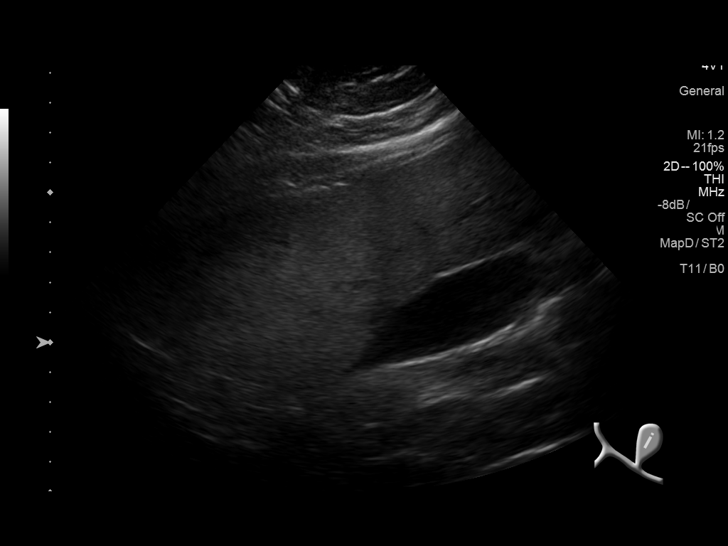
[im 4/46]
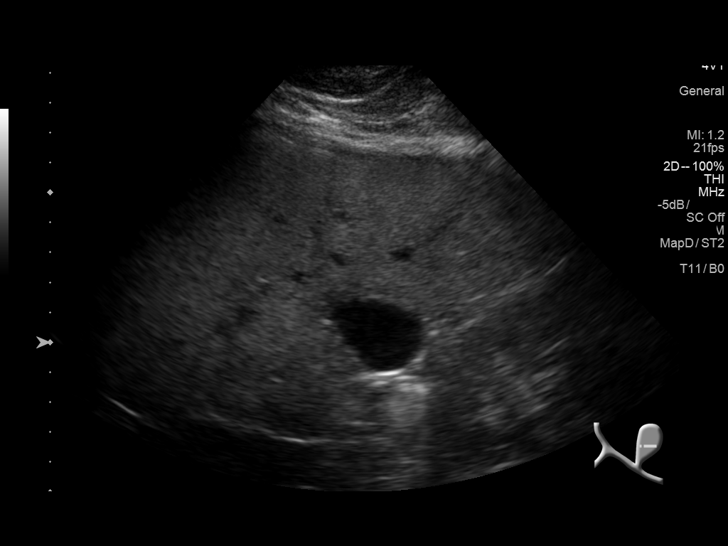
[im 8/46]
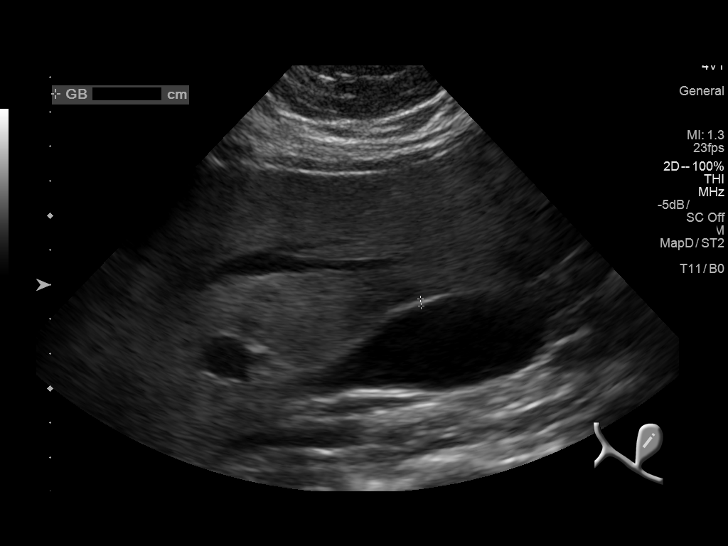
[im 12/46]
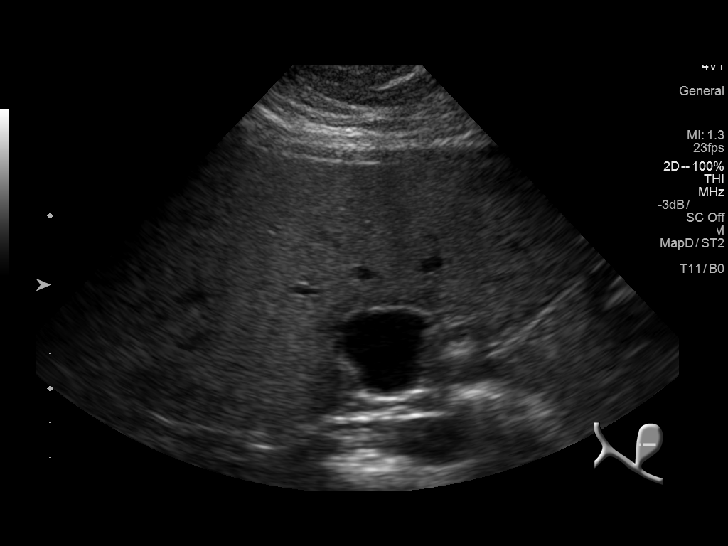
[im 16/46]
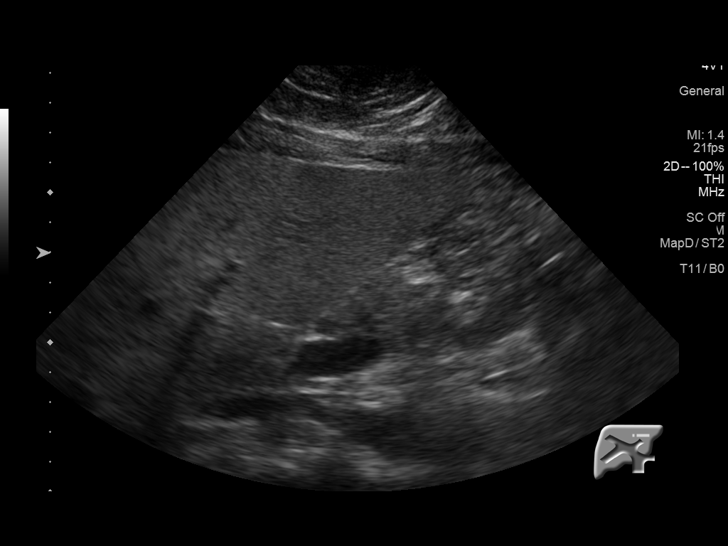
[im 17/46]
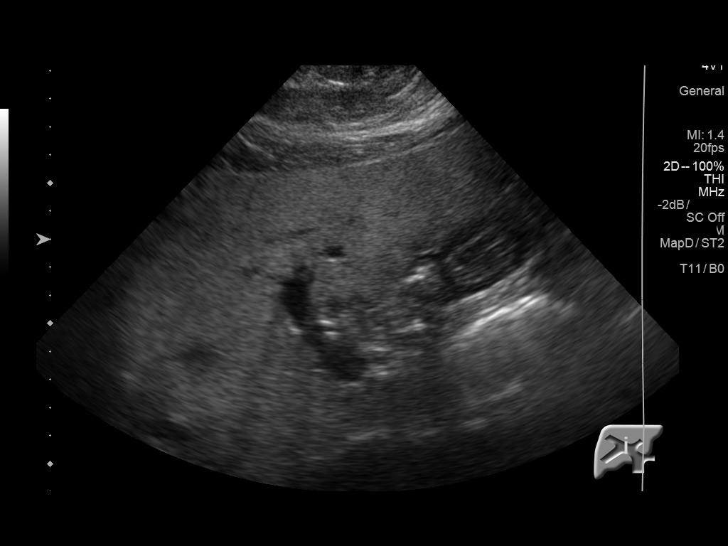
[im 21/46]
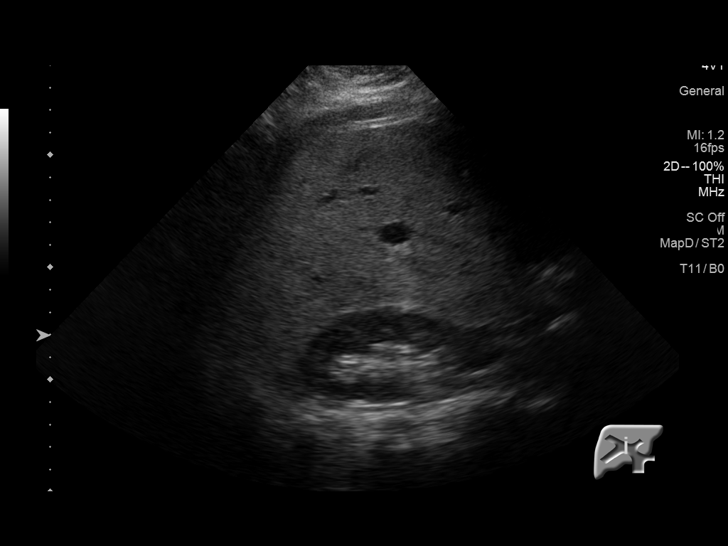
[im 25/46]
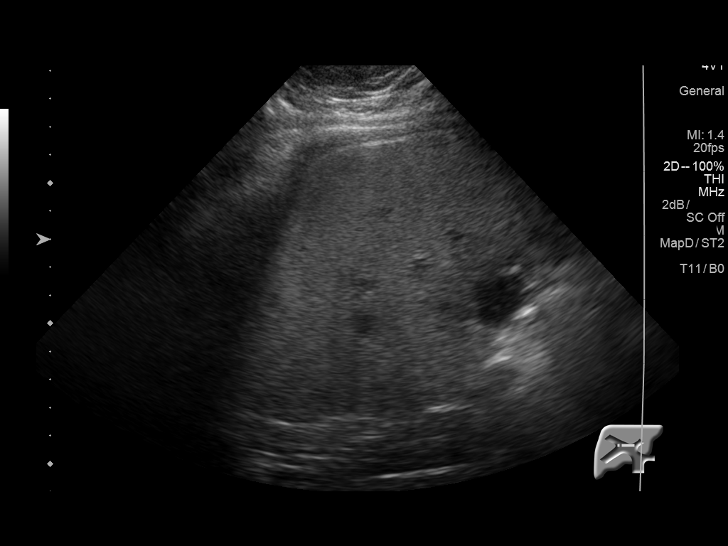
[im 29/46]
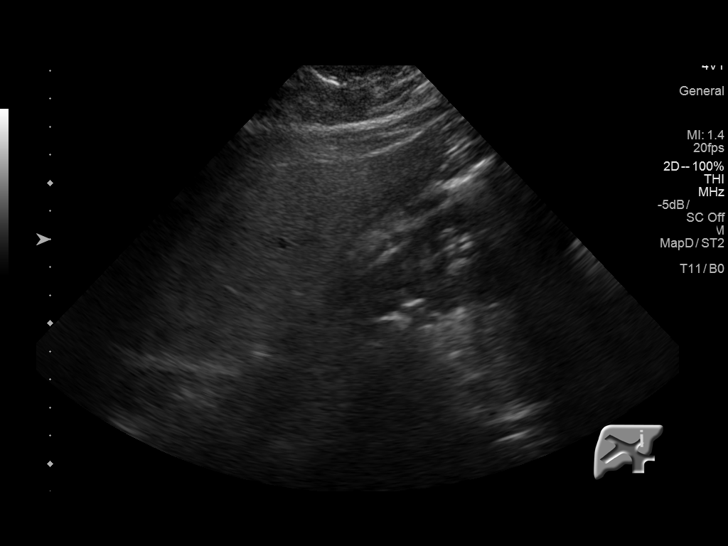
[im 31/46]
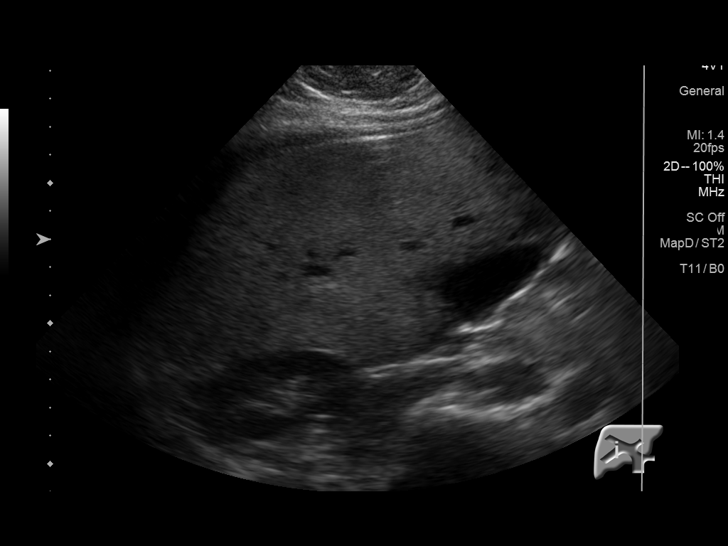
[im 34/46]
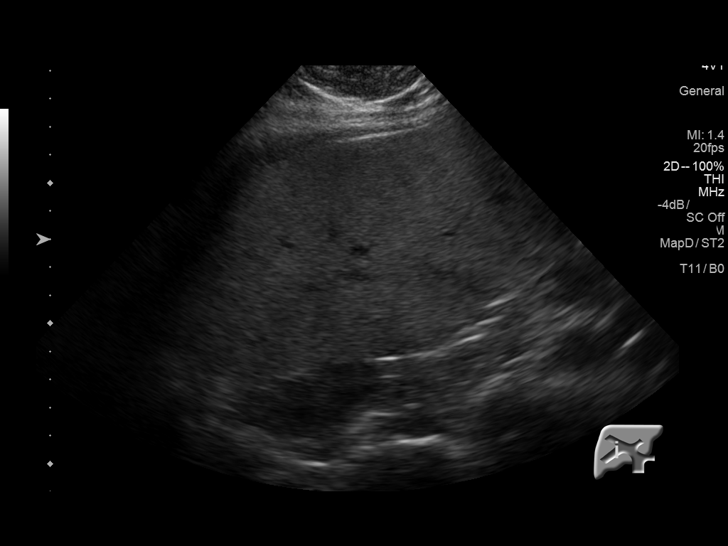
[im 38/46]
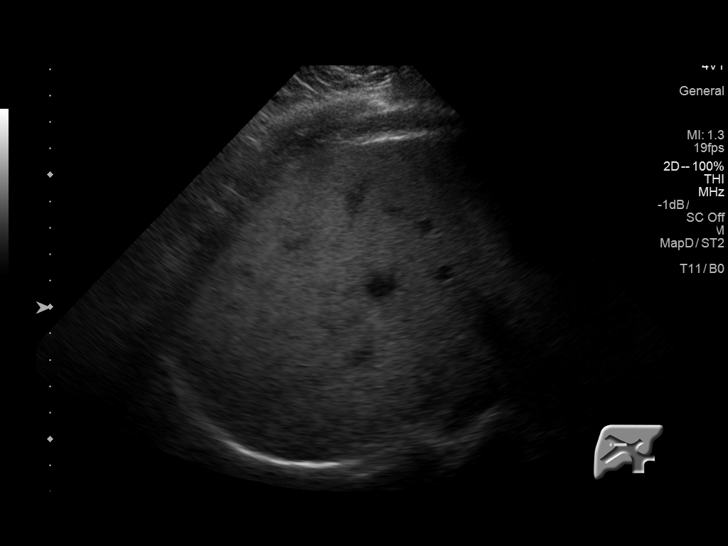
[im 42/46]
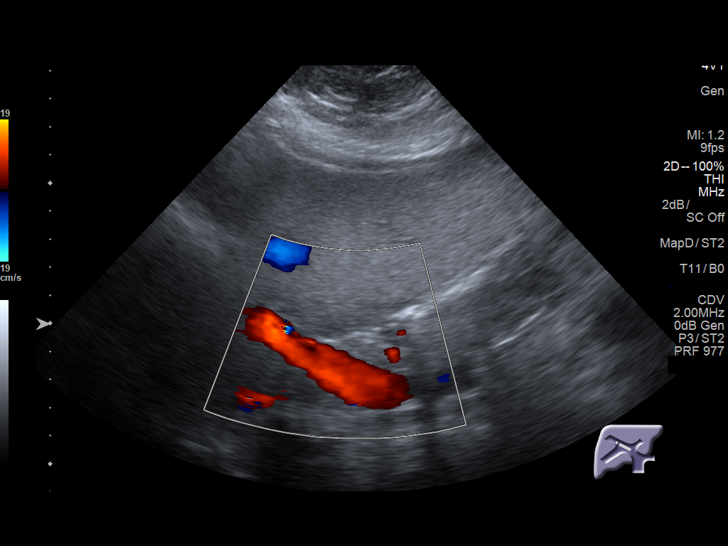
[im 46/46]
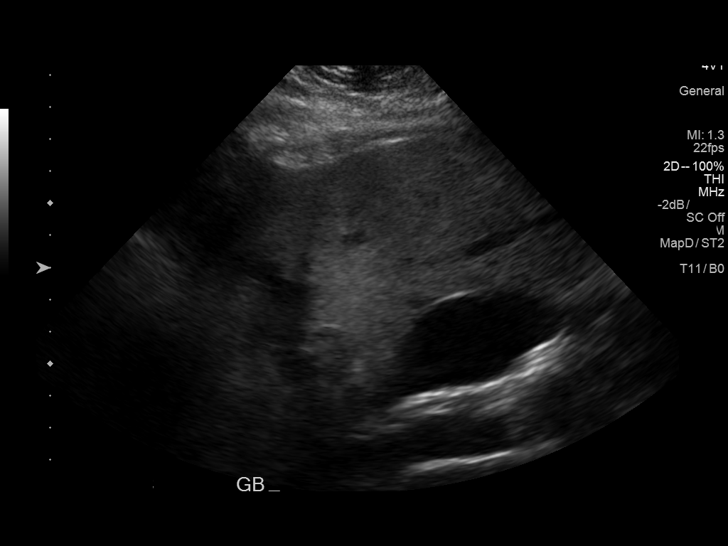

[14 of 25 positions shown; findings below may reference images not displayed]

FINDINGS: Gallbladder:

No gallstones or wall thickening visualized. No sonographic Murphy
sign noted by sonographer.

Common bile duct:

Diameter: 0.4 cm, within normal limits in caliber.

Liver:

No focal lesion identified. Diffusely increased parenchymal
echogenicity and coarsened echotexture likely reflects fatty
infiltration.
IMPRESSION: 1. No acute abnormality at the right upper quadrant.
2. Diffuse fatty infiltration within the liver.

## 2018-08-02 ENCOUNTER — Telehealth (INDEPENDENT_AMBULATORY_CARE_PROVIDER_SITE_OTHER): Payer: Self-pay | Admitting: Vascular Surgery

## 2018-08-02 ENCOUNTER — Encounter
Admission: RE | Admit: 2018-08-02 | Discharge: 2018-08-02 | Disposition: A | Discharge status: Home / Self Care | Payer: Medicaid Other | Source: Ambulatory Visit | Attending: Neurosurgery | Admitting: Neurosurgery

## 2018-08-02 ENCOUNTER — Other Ambulatory Visit: Payer: Medicaid Other

## 2018-08-02 ENCOUNTER — Other Ambulatory Visit: Payer: Self-pay

## 2018-08-02 DIAGNOSIS — M4722 Other spondylosis with radiculopathy, cervical region: Secondary | ICD-10-CM | POA: Diagnosis not present

## 2018-08-02 DIAGNOSIS — M79601 Pain in right arm: Secondary | ICD-10-CM | POA: Insufficient documentation

## 2018-08-02 DIAGNOSIS — Z01812 Encounter for preprocedural laboratory examination: Secondary | ICD-10-CM | POA: Insufficient documentation

## 2018-08-02 DIAGNOSIS — Z87891 Personal history of nicotine dependence: Secondary | ICD-10-CM | POA: Diagnosis not present

## 2018-08-02 DIAGNOSIS — I7774 Dissection of vertebral artery: Secondary | ICD-10-CM | POA: Diagnosis not present

## 2018-08-02 DIAGNOSIS — I10 Essential (primary) hypertension: Secondary | ICD-10-CM | POA: Insufficient documentation

## 2018-08-02 DIAGNOSIS — E119 Type 2 diabetes mellitus without complications: Secondary | ICD-10-CM | POA: Diagnosis not present

## 2018-08-02 DIAGNOSIS — K219 Gastro-esophageal reflux disease without esophagitis: Secondary | ICD-10-CM | POA: Diagnosis not present

## 2018-08-02 DIAGNOSIS — Z8673 Personal history of transient ischemic attack (TIA), and cerebral infarction without residual deficits: Secondary | ICD-10-CM | POA: Diagnosis not present

## 2018-08-02 DIAGNOSIS — M542 Cervicalgia: Secondary | ICD-10-CM | POA: Insufficient documentation

## 2018-08-02 DIAGNOSIS — M79602 Pain in left arm: Secondary | ICD-10-CM | POA: Diagnosis not present

## 2018-08-02 LAB — CBC
HCT: 40.1 % (ref 35.0–47.0)
Hemoglobin: 13.6 g/dL (ref 12.0–16.0)
MCH: 31.4 pg (ref 26.0–34.0)
MCHC: 34 g/dL (ref 32.0–36.0)
MCV: 92.4 fL (ref 80.0–100.0)
PLATELETS: 242 10*3/uL (ref 150–440)
RBC: 4.34 MIL/uL (ref 3.80–5.20)
RDW: 14.4 % (ref 11.5–14.5)
WBC: 4.9 10*3/uL (ref 3.6–11.0)

## 2018-08-02 LAB — DIFFERENTIAL
Basophils Absolute: 0 10*3/uL (ref 0–0.1)
Basophils Relative: 1 %
EOS ABS: 0.1 10*3/uL (ref 0–0.7)
EOS PCT: 2 %
Lymphocytes Relative: 34 %
Lymphs Abs: 1.7 10*3/uL (ref 1.0–3.6)
Monocytes Absolute: 0.4 10*3/uL (ref 0.2–0.9)
Monocytes Relative: 8 %
NEUTROS PCT: 55 %
Neutro Abs: 2.7 10*3/uL (ref 1.4–6.5)

## 2018-08-02 LAB — TYPE AND SCREEN
ABO/RH(D): O NEG
Antibody Screen: NEGATIVE

## 2018-08-02 LAB — APTT: aPTT: 27 seconds (ref 24–36)

## 2018-08-02 LAB — URINALYSIS, ROUTINE W REFLEX MICROSCOPIC
Bacteria, UA: NONE SEEN
Bilirubin Urine: NEGATIVE
Glucose, UA: >=500 mg/dL — AB
Hgb urine dipstick: NEGATIVE
KETONES UR: NEGATIVE mg/dL
LEUKOCYTES UA: NEGATIVE
Nitrite: NEGATIVE
PH: 7 (ref 5.0–8.0)
Protein, ur: NEGATIVE mg/dL
SPECIFIC GRAVITY, URINE: 1.021 (ref 1.005–1.030)

## 2018-08-02 LAB — SURGICAL PCR SCREEN
MRSA, PCR: POSITIVE — AB
STAPHYLOCOCCUS AUREUS: POSITIVE — AB

## 2018-08-02 LAB — BASIC METABOLIC PANEL
Anion gap: 9 (ref 5–15)
BUN: 15 mg/dL (ref 6–20)
CO2: 30 mmol/L (ref 22–32)
CREATININE: 0.93 mg/dL (ref 0.44–1.00)
Calcium: 9.2 mg/dL (ref 8.9–10.3)
Chloride: 100 mmol/L (ref 98–111)
GFR calc non Af Amer: >60 mL/min (ref >60)
Glucose, Bld: 239 mg/dL — ABNORMAL HIGH (ref 70–99)
Potassium: 4.4 mmol/L (ref 3.5–5.1)
SODIUM: 139 mmol/L (ref 135–145)

## 2018-08-02 LAB — PROTIME-INR
INR: 0.93
PROTHROMBIN TIME: 12.4 s (ref 11.4–15.2)

## 2018-08-02 NOTE — Pre-Procedure Instructions (Signed)
LM FOR DR [place]. AFTER DISCUSSING PATIENT HX AND HIGH RISK CLEARANCE BY PCP, WITH DR A KARENZ, ANESTHESIOLOGIST, PATIENT NEEDS TO HAVE SURGERY AT [facility]. NOT SCHEDULED TO SEE VASCULAR UNTIL 10/21/18

## 2018-08-02 NOTE — Pre-Procedure Instructions (Signed)
DR Larey Days SPOKE WITH DR Izora Ribas AND SURGERY WILL BE DELAYED UNTIL AFTER VASCULAR CLEARANCE RECEIVED. POSITIVE MRSA/ POSITIVE STAP FAXED TO DR Izora Ribas

## 2018-08-02 NOTE — Patient Instructions (Addendum)
Your procedure is scheduled on: Wednesday 08/09/18 Report to [place]. To find out your arrival time please call [phone] between 1PM - 3PM on Tuesday 08/08/18.  Remember: Instructions that are not followed completely may result in serious medical risk, up to and including death, or upon the discretion of your surgeon and anesthesiologist your surgery may need to be rescheduled.     _X__ 1. Do not eat food after midnight the night before your procedure.                 No gum chewing or hard candies. You may drink clear liquids up to 2 hours                 before you are scheduled to arrive for your surgery- DO not drink clear                 liquids within 2 hours of the start of your surgery.                 Clear Liquids include:  water, apple juice without pulp, clear carbohydrate                 drink such as Clearfast or Gatorade, Black Coffee or Tea (Do not add                 anything to coffee or tea).  __X__2.  On the morning of surgery brush your teeth with toothpaste and water, you                 may rinse your mouth with mouthwash if you wish.  Do not swallow any              toothpaste of mouthwash.     _X__ 3.  No Alcohol for 24 hours before or after surgery.   _X__ 4.  Do Not Smoke or use e-cigarettes For 24 Hours Prior to Your Surgery.                 Do not use any chewable tobacco products for at least 6 hours prior to                 surgery.  ____  5.  Bring all medications with you on the day of surgery if instructed.   __X__  6.  Notify your doctor if there is any change in your medical condition      (cold, fever, infections).     Do not wear jewelry, make-up, hairpins, clips or nail polish. Do not wear lotions, powders, or perfumes.  Do not shave 48 hours prior to surgery. Men may shave face and neck. Do not bring valuables to the hospital.    [facility] is not responsible for any belongings or  valuables.  Contacts, dentures/partials or body piercings may not be worn into surgery. Bring a case for your contacts, glasses or hearing aids, a denture cup will be supplied. Leave your suitcase in the car. After surgery it may be brought to your room. For patients admitted to the hospital, discharge time is determined by your treatment team.   Patients discharged the day of surgery will not be allowed to drive home.   Please read over the following fact sheets that you were given:   MRSA Information  __X__ Take these medicines the morning of surgery with A SIP OF WATER:  1. atorvastatin (LIPITOR)  2. gabapentin (NEURONTIN)  3. oxyCODONE-acetaminophen (PERCOCET) if needed  4.  5.  6.  ____ Fleet Enema (as directed)   __X__ Use CHG Soap/SAGE wipes as directed  __X__ Use inhalers on the day of surgery  __X__ Stop metformin/Janumet/Farxiga 2 days prior to surgery    __X__ Take 1/2 of usual insulin dose the night before surgery. No insulin the morning          of surgery.   __X__ Stop Blood Thinners Coumadin/Plavix/Xarelto/Pleta/Pradaxa/Eliquis/Effient/Aspirin  on   Or contact your Surgeon, Cardiologist or Medical Doctor regarding  ability to stop your blood thinners  (per Dr Brunetta Genera stop Plavix 2 days prior to procedure he did not address aspirin, you need to call about this)  __X__ Stop Anti-inflammatories 7 days before surgery such as Advil, Ibuprofen, Motrin,  BC or Goodies Powder, Naprosyn, Naproxen, Aleve, Aspirin    __X__ Stop all herbal supplements, fish oil or vitamin E until after surgery.    __X__ Bring C-Pap to the hospital.

## 2018-08-02 NOTE — Pre-Procedure Instructions (Signed)
SPOKE [place] AND PATIENT WILL BE SET UP FOR SURGERY AT [place]

## 2018-08-02 NOTE — Telephone Encounter (Signed)
We have availability for tomorrow at 1pm with GS please put on the appt line surgical clearance per Dr Barrington Ellison and contact the office with information

## 2018-08-03 ENCOUNTER — Ambulatory Visit (INDEPENDENT_AMBULATORY_CARE_PROVIDER_SITE_OTHER): Payer: Medicaid Other | Admitting: Vascular Surgery

## 2018-08-03 ENCOUNTER — Encounter (INDEPENDENT_AMBULATORY_CARE_PROVIDER_SITE_OTHER): Payer: Self-pay | Admitting: Vascular Surgery

## 2018-08-03 VITALS — BP 152/98 | HR 108 | Resp 16 | Ht 65.0 in | Wt 325.4 lb

## 2018-08-03 DIAGNOSIS — Z794 Long term (current) use of insulin: Secondary | ICD-10-CM

## 2018-08-03 DIAGNOSIS — I10 Essential (primary) hypertension: Secondary | ICD-10-CM

## 2018-08-03 DIAGNOSIS — E119 Type 2 diabetes mellitus without complications: Secondary | ICD-10-CM | POA: Diagnosis not present

## 2018-08-03 DIAGNOSIS — I7774 Dissection of vertebral artery: Secondary | ICD-10-CM | POA: Diagnosis not present

## 2018-08-03 DIAGNOSIS — K219 Gastro-esophageal reflux disease without esophagitis: Secondary | ICD-10-CM

## 2018-08-03 DIAGNOSIS — M4722 Other spondylosis with radiculopathy, cervical region: Secondary | ICD-10-CM | POA: Diagnosis not present

## 2018-08-04 ENCOUNTER — Telehealth (INDEPENDENT_AMBULATORY_CARE_PROVIDER_SITE_OTHER): Payer: Self-pay

## 2018-08-04 ENCOUNTER — Encounter (INDEPENDENT_AMBULATORY_CARE_PROVIDER_SITE_OTHER): Payer: Self-pay | Admitting: Vascular Surgery

## 2018-08-04 ENCOUNTER — Ambulatory Visit
Admission: RE | Admit: 2018-08-04 | Discharge: 2018-08-04 | Disposition: A | Discharge status: Home / Self Care | Payer: Medicaid Other | Source: Ambulatory Visit | Attending: Vascular Surgery | Admitting: Vascular Surgery

## 2018-08-04 DIAGNOSIS — M47812 Spondylosis without myelopathy or radiculopathy, cervical region: Secondary | ICD-10-CM | POA: Insufficient documentation

## 2018-08-04 DIAGNOSIS — I7774 Dissection of vertebral artery: Secondary | ICD-10-CM | POA: Diagnosis present

## 2018-08-04 MED ORDER — GADOBENATE DIMEGLUMINE 529 MG/ML IV SOLN
20.0000 mL | Freq: Once | INTRAVENOUS | Status: AC | PRN
  Administered 2018-08-04: 20 mL via INTRAVENOUS

## 2018-08-04 NOTE — Telephone Encounter (Signed)
Patient does not need to come for her appointment on Monday per Dr. Delana Meyer.

## 2018-08-04 NOTE — Progress Notes (Signed)
MRN : [id number]  Brenda Hicks is a 52 y.o. (05-12-1966) female who presents with chief complaint of  Chief Complaint  Patient presents with  . Follow-up    surgical clearance for Dr Cari Caraway  .  History of Present Illness:   I am asked to evaluate the patient by Dr. Cari Caraway.  The patient has been having neck and bilateral arm pain radiating down to her hands for several months.  Few months ago she did have an CTA of the neck during an ER visit and this showed normal carotid and normal vertebral arteries.  Just a couple weeks ago her pain worsens severely and an abrupt fashion it is continuous and she is now having bilateral involvement.  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  Subsequent to this second scan and MR was obtained which clearly shows C4 disc protrusion impacting the cord.  She has been scheduled for surgery this coming Wednesday.  The patient denies any recent deceleration injuries.  She denies any headaches.  She denies any TIA or focal motor deficits.  There is no past history of a stroke.  Current Meds  Medication Sig  . albuterol (PROVENTIL HFA;VENTOLIN HFA) 108 (90 Base) MCG/ACT inhaler Inhale 2 puffs into the lungs every 6 (six) hours as needed for wheezing or shortness of breath.  Marland Kitchen aspirin EC 81 MG tablet Take 81 mg by mouth daily.   Marland Kitchen atorvastatin (LIPITOR) 40 MG tablet Take 1 tablet (40 mg total) by mouth daily.  . clopidogrel (PLAVIX) 75 MG tablet Take 1 tablet (75 mg total) by mouth daily.  Marland Kitchen docusate sodium (COLACE) 100 MG capsule Take 1 capsule (100 mg total) by mouth 2 (two) times daily as needed for mild constipation.  Water engineer Bandages & Supports (MEDICAL COMPRESSION STOCKINGS) MISC 2 medium stockings Use as directed on both lower legs  . Exenatide ER (BYDUREON) 2 MG PEN Inject 2 mg into the skin once a week. On Fridays  . FLOVENT  HFA 220 MCG/ACT inhaler Inhale 2 puffs into the lungs 2 (two) times daily.  . furosemide (LASIX) 40 MG tablet Take 1 tablet by mouth daily.  Marland Kitchen gabapentin (NEURONTIN) 300 MG capsule Take 300 mg by mouth 3 (three) times daily.   Marland Kitchen HYDROmorphone (DILAUDID) 2 MG tablet Take 1 tablet (2 mg total) by mouth every 12 (twelve) hours as needed for severe pain (For pain unrelieved by Percocet).  . insulin aspart (NOVOLOG) 100 UNIT/ML injection Inject 10 Units into the skin 3 (three) times daily with meals.  . insulin glargine (LANTUS) 100 UNIT/ML injection Inject 0.2 mLs (20 Units total) into the skin daily. (Patient taking differently: Inject 20 Units into the skin at bedtime. )  . ketorolac (TORADOL) 10 MG tablet Take 1 tablet (10 mg total) by mouth every 6 (six) hours as needed.  . metFORMIN (GLUCOPHAGE) 1000 MG tablet Take 1 tablet by mouth 2 (two) times daily.  . orphenadrine (NORFLEX) 100 MG tablet Take 1 tablet (100 mg total) by mouth 2 (two) times daily as needed for muscle spasms.  Marland Kitchen oxyCODONE-acetaminophen (PERCOCET) 10-325 MG tablet Take 1 tablet by mouth every 6 (six) hours as needed for pain.  . potassium chloride (K-DUR) 10 MEQ tablet Take 1 tablet by mouth daily.  . predniSONE (STERAPRED UNI-PAK 21 TAB) 10 MG (21) TBPK tablet Take by mouth daily. Dispense steroid  taper pack as directed  . promethazine (PHENERGAN) 25 MG tablet Take 1 tablet (25 mg total) by mouth every 6 (six) hours as needed for nausea or vomiting.  . sitaGLIPtin (JANUVIA) 100 MG tablet Take 100 mg by mouth daily. Patient reports taking Januvia 100 mg daily  . STIOLTO RESPIMAT 2.5-2.5 MCG/ACT AERS Take 2 puffs by mouth daily.  Marland Kitchen tiZANidine (ZANAFLEX) 4 MG tablet Take 1 tablet by mouth every 8 (eight) hours as needed for muscle spasms.   . traZODone (DESYREL) 50 MG tablet Take 1 tablet by mouth at bedtime.    Past Medical History:  Diagnosis Date  . Arthritis   . Asthma    WELL CONTROLLED  . Cancer of ear    skin cancer  left ear  . Diabetes mellitus without complication ([place])   . Fatty liver   . Hypertension   . Kidney cysts   . Renal disorder   . Sleep apnea    USES CPAP  . Stroke [facility]) May or June 2019   TIA    Past Surgical History:  Procedure Laterality Date  . DILATION AND CURETTAGE OF UTERUS    . ENDOMETRIAL BIOPSY    . EXPLORATORY LAPAROTOMY     REMOVAL OF RUPTURED ECTOPIC  . HAND SURGERY    . HERNIA REPAIR     UMBILICAL  . JOINT REPLACEMENT Right    TKR  . KNEE ARTHROSCOPY    . KNEE SURGERY Right   . SHOULDER ARTHROSCOPY WITH BICEPSTENOTOMY Left 12/14/2016   Procedure: SHOULDER ARTHROSCOPY WITH BICEPSTENOTOMY;  Surgeon: Corky Mull, MD;  Location: [facility] ORS;  Service: Orthopedics;  Laterality: Left;  . SHOULDER ARTHROSCOPY WITH OPEN ROTATOR CUFF REPAIR Left 12/14/2016   Procedure: SHOULDER ARTHROSCOPY WITH OPEN ROTATOR CUFF REPAIR AND ARTHROSCOPIC ROTATOR CUFF REPAIR;  Surgeon: Corky Mull, MD;  Location: [facility] ORS;  Service: Orthopedics;  Laterality: Left;  . SHOULDER ARTHROSCOPY WITH SUBACROMIAL DECOMPRESSION Left 12/14/2016   Procedure: SHOULDER ARTHROSCOPY WITH SUBACROMIAL DECOMPRESSION;  Surgeon: Corky Mull, MD;  Location: [facility] ORS;  Service: Orthopedics;  Laterality: Left;  . TUBAL LIGATION      Social History Social History   Tobacco Use  . Smoking status: Former Smoker    Packs/day: 2.00    Years: 25.00    Pack years: 50.00    Types: Cigarettes  . Smokeless tobacco: Never Used  Substance Use Topics  . Alcohol use: No  . Drug use: No    Family History Family History  Problem Relation Age of Onset  . CAD Maternal Grandmother   No family history of bleeding/clotting disorders, porphyria or autoimmune disease   No Known Allergies   REVIEW OF SYSTEMS (Negative unless checked)  Constitutional: [] Weight loss  [] Fever  [] Chills Cardiac: [] Chest pain   [] Chest pressure   [] Palpitations   [] Shortness of breath when laying flat   [x] Shortness of breath with  exertion. Vascular:  [] Pain in legs with walking   [] Pain in legs at rest  [] History of DVT   [] Phlebitis   [] Swelling in legs   [] Varicose veins   [] Non-healing ulcers Pulmonary:   [] Uses home oxygen   [] Productive cough   [] Hemoptysis   [] Wheeze  [] COPD   [] Asthma Neurologic:  [] Dizziness   [] Seizures   [] History of stroke   [] History of TIA  [] Aphasia   [] Vissual changes   [x] Weakness or numbness in arm   [] Weakness or numbness in leg Musculoskeletal:   [] Joint swelling   [] Joint pain   []   Low back pain Hematologic:  [] Easy bruising  [] Easy bleeding   [] Hypercoagulable state   [] Anemic Gastrointestinal:  [] Diarrhea   [] Vomiting  [] Gastroesophageal reflux/heartburn   [] Difficulty swallowing. Genitourinary:  [] Chronic kidney disease   [] Difficult urination  [] Frequent urination   [] Blood in urine Skin:  [] Rashes   [] Ulcers  Psychological:  [] History of anxiety   []  History of major depression.  Physical Examination  Vitals:   08/03/18 1316  BP: (!) 152/98  Pulse: (!) 108  Resp: 16  Weight: (!) 325 lb 6.4 oz (147.6 kg)  Height: 5\' 5"  (1.651 m)   Body mass index is 54.15 kg/m. Gen: WD/WN, NAD Head: [place]/AT, No temporalis wasting.  Ear/Nose/Throat: Hearing grossly intact, nares w/o erythema or drainage, poor dentition Eyes: PER, EOMI, sclera nonicteric.  Neck: Supple, no masses.  No bruit or JVD.  Pulmonary:  Good air movement, clear to auscultation bilaterally, no use of accessory muscles.  Cardiac: RRR, normal S1, S2, no Murmurs. Vascular: No carotid bruits Vessel Right Left  Radial Palpable Palpable  Ulnar Palpable Palpable  Brachial Palpable Palpable  Carotid Palpable Palpable  Gastrointestinal: soft, non-distended. No guarding/no peritoneal signs.  Musculoskeletal: M/S 5/5 throughout.  No deformity or atrophy.  Neurologic: CN 2-12 intact. Pain and light touch intact in extremities.  Symmetrical.  Speech is fluent. Motor exam as listed above. Psychiatric: Judgment intact, Mood &  affect appropriate for pt's clinical situation. Dermatologic: No rashes or ulcers noted.  No changes consistent with cellulitis. Lymph : No Cervical lymphadenopathy, no lichenification or skin changes of chronic lymphedema.  CBC Lab Results  Component Value Date   WBC 4.9 08/02/2018   HGB 13.6 08/02/2018   HCT 40.1 08/02/2018   MCV 92.4 08/02/2018   PLT 242 08/02/2018    BMET    Component Value Date/Time   NA 139 08/02/2018 0844   NA 132 (L) 05/10/2014 1240   K 4.4 08/02/2018 0844   K 4.2 05/10/2014 1240   CL 100 08/02/2018 0844   CL 99 05/10/2014 1240   CO2 30 08/02/2018 0844   CO2 28 05/10/2014 1240   GLUCOSE 239 (H) 08/02/2018 0844   GLUCOSE 457 (H) 05/10/2014 1240   BUN 15 08/02/2018 0844   BUN 13 05/10/2014 1240   CREATININE 0.93 08/02/2018 0844   CREATININE 0.95 05/10/2014 1240   CALCIUM 9.2 08/02/2018 0844   CALCIUM 9.0 05/10/2014 1240   GFRNONAA >60 08/02/2018 0844   GFRNONAA >60 05/10/2014 1240   GFRAA >60 08/02/2018 0844   GFRAA >60 05/10/2014 1240   Estimated Creatinine Clearance: 106.5 mL/min (by C-G formula based on SCr of 0.93 mg/dL).  COAG Lab Results  Component Value Date   INR 0.93 08/02/2018   INR 1.0 12/26/2012   INR 0.9 11/06/2012    Radiology Ct Angio Head W/cm &/or Wo Cm  Result Date: 07/18/2018 CLINICAL DATA:  Neck pain for a year, worsening today after physical therapy. History of skin cancer, stroke, diabetes, hypertension. EXAM: CT ANGIOGRAPHY HEAD AND NECK TECHNIQUE: Multidetector CT imaging of the head and neck was performed using the standard protocol during bolus administration of intravenous contrast. Multiplanar CT image reconstructions and MIPs were obtained to evaluate the vascular anatomy. Carotid stenosis measurements (when applicable) are obtained utilizing NASCET criteria, using the distal internal carotid diameter as the denominator. CONTRAST:  36mL ISOVUE-370 IOPAMIDOL (ISOVUE-370) INJECTION 76% COMPARISON:  MRI of the head  Mar 31, 2018, CTA HEAD and neck Mar 30, 2018. FINDINGS: CT HEAD FINDINGS BRAIN: No intraparenchymal hemorrhage,  mass effect nor midline shift. The ventricles and sulci are normal. No acute large vascular territory infarcts. No abnormal extra-axial fluid collections. Basal cisterns are patent. VASCULAR: Unremarkable. SKULL/SOFT TISSUES: No skull fracture. Partially empty sella. No significant soft tissue swelling. ORBITS/SINUSES: The included ocular globes and orbital contents are normal.Trace paranasal sinus mucosal thickening. Mastoid air cells are well aerated. OTHER: None. CTA NECK FINDINGS: Large body habitus results in overall noisy image quality. AORTIC ARCH: Normal appearance of the thoracic arch, 2 vessel arch is a normal variant. The origins of the innominate, left Common carotid artery and subclavian artery are widely patent. RIGHT CAROTID SYSTEM: Common carotid artery is patent. Normal appearance of the carotid bifurcation without hemodynamically significant stenosis by NASCET criteria. Normal appearance of the internal carotid artery. LEFT CAROTID SYSTEM: Common carotid artery is patent. Normal appearance of the carotid bifurcation without hemodynamically significant stenosis by NASCET criteria. Normal appearance of the internal carotid artery. VERTEBRAL ARTERIES:Patent vertebral arteries, limited assessment of luminal irregularity due to noisy image quality. Intermittent loss of demonstrable contrast enhancement bilateral mid V2 segments, segment unchanged. SKELETON: No acute osseous process though bone windows have not been submitted. Minimal calcific tendinopathy longus coli insertion. Moderate LEFT C4-5 neural foraminal narrowing. OTHER NECK: Soft tissues of the neck are nonacute though, not tailored for evaluation. UPPER CHEST: Included lung apices are clear. No superior mediastinal lymphadenopathy. CTA HEAD FINDINGS: ANTERIOR CIRCULATION: Patent cervical internal carotid arteries, petrous, cavernous  and supra clinoid internal carotid arteries. Patent anterior communicating artery. Patent anterior and middle cerebral arteries. No large vessel occlusion, significant stenosis, contrast extravasation or aneurysm. POSTERIOR CIRCULATION: Patent vertebral arteries, vertebrobasilar junction and basilar artery, as well as main branch vessels. Patent posterior cerebral arteries. Robust RIGHT and small LEFT posterior communicating arteries present. No large vessel occlusion, significant stenosis, contrast extravasation or aneurysm. VENOUS SINUSES: Major dural venous sinuses are patent though not tailored for evaluation on this angiographic examination. ANATOMIC VARIANTS: Hypoplastic LEFT A1 segment. Bilateral A2 segments predominately arise from RIGHT A1-2 junction. DELAYED PHASE: No abnormal intracranial enhancement. MIP images reviewed. IMPRESSION: CT HEAD: 1. Negative CT HEAD with and without contrast. 2. Partially empty sella. CTA NECK: 1. Similar poor characterization of vertebral arteries with poor contrast opacification V2 segment, potential chronic vertebral artery dissections without occlusion. 2. No hemodynamically significant stenosis ICA's. CTA HEAD: 1. No emergent large vessel occlusion or flow-limiting stenosis. Complete circle-of-Willis. Electronically Signed   By: Elon Alas M.D.   On: 07/18/2018 20:47   Ct Angio Neck W And/or Wo Contrast  Result Date: 07/18/2018 CLINICAL DATA:  Neck pain for a year, worsening today after physical therapy. History of skin cancer, stroke, diabetes, hypertension. EXAM: CT ANGIOGRAPHY HEAD AND NECK TECHNIQUE: Multidetector CT imaging of the head and neck was performed using the standard protocol during bolus administration of intravenous contrast. Multiplanar CT image reconstructions and MIPs were obtained to evaluate the vascular anatomy. Carotid stenosis measurements (when applicable) are obtained utilizing NASCET criteria, using the distal internal carotid  diameter as the denominator. CONTRAST:  72mL ISOVUE-370 IOPAMIDOL (ISOVUE-370) INJECTION 76% COMPARISON:  MRI of the head Mar 31, 2018, CTA HEAD and neck Mar 30, 2018. FINDINGS: CT HEAD FINDINGS BRAIN: No intraparenchymal hemorrhage, mass effect nor midline shift. The ventricles and sulci are normal. No acute large vascular territory infarcts. No abnormal extra-axial fluid collections. Basal cisterns are patent. VASCULAR: Unremarkable. SKULL/SOFT TISSUES: No skull fracture. Partially empty sella. No significant soft tissue swelling. ORBITS/SINUSES: The included ocular globes and orbital contents are  normal.Trace paranasal sinus mucosal thickening. Mastoid air cells are well aerated. OTHER: None. CTA NECK FINDINGS: Large body habitus results in overall noisy image quality. AORTIC ARCH: Normal appearance of the thoracic arch, 2 vessel arch is a normal variant. The origins of the innominate, left Common carotid artery and subclavian artery are widely patent. RIGHT CAROTID SYSTEM: Common carotid artery is patent. Normal appearance of the carotid bifurcation without hemodynamically significant stenosis by NASCET criteria. Normal appearance of the internal carotid artery. LEFT CAROTID SYSTEM: Common carotid artery is patent. Normal appearance of the carotid bifurcation without hemodynamically significant stenosis by NASCET criteria. Normal appearance of the internal carotid artery. VERTEBRAL ARTERIES:Patent vertebral arteries, limited assessment of luminal irregularity due to noisy image quality. Intermittent loss of demonstrable contrast enhancement bilateral mid V2 segments, segment unchanged. SKELETON: No acute osseous process though bone windows have not been submitted. Minimal calcific tendinopathy longus coli insertion. Moderate LEFT C4-5 neural foraminal narrowing. OTHER NECK: Soft tissues of the neck are nonacute though, not tailored for evaluation. UPPER CHEST: Included lung apices are clear. No superior  mediastinal lymphadenopathy. CTA HEAD FINDINGS: ANTERIOR CIRCULATION: Patent cervical internal carotid arteries, petrous, cavernous and supra clinoid internal carotid arteries. Patent anterior communicating artery. Patent anterior and middle cerebral arteries. No large vessel occlusion, significant stenosis, contrast extravasation or aneurysm. POSTERIOR CIRCULATION: Patent vertebral arteries, vertebrobasilar junction and basilar artery, as well as main branch vessels. Patent posterior cerebral arteries. Robust RIGHT and small LEFT posterior communicating arteries present. No large vessel occlusion, significant stenosis, contrast extravasation or aneurysm. VENOUS SINUSES: Major dural venous sinuses are patent though not tailored for evaluation on this angiographic examination. ANATOMIC VARIANTS: Hypoplastic LEFT A1 segment. Bilateral A2 segments predominately arise from RIGHT A1-2 junction. DELAYED PHASE: No abnormal intracranial enhancement. MIP images reviewed. IMPRESSION: CT HEAD: 1. Negative CT HEAD with and without contrast. 2. Partially empty sella. CTA NECK: 1. Similar poor characterization of vertebral arteries with poor contrast opacification V2 segment, potential chronic vertebral artery dissections without occlusion. 2. No hemodynamically significant stenosis ICA's. CTA HEAD: 1. No emergent large vessel occlusion or flow-limiting stenosis. Complete circle-of-Willis. Electronically Signed   By: Elon Alas M.D.   On: 07/18/2018 20:47   Mr Jodene Nam Neck W Wo Contrast  Result Date: 08/04/2018 CLINICAL DATA:  Dissection of vertebral artery EXAM: MRA NECK WITHOUT AND WITH CONTRAST TECHNIQUE: Multiplanar and multiecho pulse sequences of the neck were obtained without and with intravenous contrast. Angiographic images of the neck were obtained using MRA technique without and with intravenous contrast. CONTRAST:  62mL MULTIHANCE GADOBENATE DIMEGLUMINE 529 MG/ML IV SOLN COMPARISON:  CTA head neck 07/18/2018  FINDINGS: Time-of-flight shows antegrade flow in both carotid and vertebrals. The left ICA is smaller than the right in the setting of left A1 aplasia and fetal type right PCA. The carotids are smooth and widely patent. Unremarkable arch and proximal subclavian arteries. Both vertebral arteries are smooth and patent to the dura when accounting for tortuosity and mild artifact. The origins are not well seen on this study but both were widely patent on recent CTA. Poor vertebral visualization on prior CTA attributed to soft tissue attenuation. IMPRESSION: Negative neck MRA as described. Poor vertebral visualization on prior CTA is attributed to artifact. Electronically Signed   By: Monte Fantasia M.D.   On: 08/04/2018 12:50   Mr Cervical Spine W Or Wo Contrast  Result Date: 07/19/2018 CLINICAL DATA:  Neck pain, chronic, normal neuro exam, negative x-ray. Possible chronic vertebral artery dissections without acute abnormality or  change in based on CT angiogram performed yesterday. Focal stenosis or occlusion and reconstitution of the vertebral arteries at C4-5 bilaterally. EXAM: MRI CERVICAL SPINE WITHOUT AND WITH CONTRAST TECHNIQUE: Multiplanar and multiecho pulse sequences of the cervical spine, to include the craniocervical junction and cervicothoracic junction, were obtained without and with intravenous contrast. CONTRAST:  68mL MULTIHANCE GADOBENATE DIMEGLUMINE 529 MG/ML IV SOLN COMPARISON:  CTA head and neck 07/18/2018. FINDINGS: Alignment: There is straightening of the normal cervical lordosis. AP alignment is anatomic. Vertebrae: Marrow signal and vertebral body heights are normal. No pathologic enhancement is present. Cord: Focal right paramedian ventral cord signal T2 hyperintensity is present at C4-5. Normal signal is present through the remainder of the cervical and upper thoracic spinal cord. Posterior Fossa, vertebral arteries, paraspinal tissues: The craniocervical junction is normal. Visualized  intracranial contents are normal. Flow is present in the vertebral arteries bilaterally. Disc levels: C2-3: Negative. C3-4: Mild uncovertebral and facet hypertrophy is present bilaterally. This results in mild bilateral foraminal narrowing. Central canal is patent. C4-5: A leftward disc osteophyte complex is present. There is effacement and some distortion of the ventral surface the cord on the left. Severe left and moderate right foraminal stenosis is present. C5-6: Uncovertebral spurring is present bilaterally. Facet hypertrophy contributes to mild bilateral foraminal narrowing. C6-7: Negative. C7-T1: Negative. IMPRESSION: 1. Cervical spondylosis is most severe at C4-5 with a leftward disc osteophyte complex resulting in moderate left central canal stenosis with severe left and moderate right foraminal narrowing. 2. Focal ventral T2 cord signal abnormality at the C4-5 level. 3. Mild foraminal narrowing bilaterally at C3-4 and C5-6 due to uncovertebral and facet disease. Electronically Signed   By: San Morelle M.D.   On: 07/19/2018 12:28   Dg Chest Port 1 View  Result Date: 07/18/2018 CLINICAL DATA:  Left-sided chest pain EXAM: PORTABLE CHEST 1 VIEW COMPARISON:  03/29/2018 FINDINGS: The heart size and mediastinal contours are within normal limits. Both lungs are clear. The visualized skeletal structures are unremarkable. IMPRESSION: No active disease. Electronically Signed   By: Inez Catalina M.D.   On: 07/18/2018 19:34     Assessment/Plan 1. 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HCC) The patient remains asymptomatic with respect to the possible vertebral artery disease.  However, the patient has a well-defined disc impinging on the cord which is the most likely cause of her upper extremity and neck pain.  She is scheduled for surgery but given her recent diagnosis of vertebral artery dissection has been placed on dual antiplatelet therapy.  This must be discontinued prior to her disc  surgery.  Patient should undergo MR angiography of the neck to define the degree of stenosis of the vertebral arteries bilaterally.  The risks, benefits and alternative therapies were reviewed in detail with the patient.  All questions were answered.  The patient agrees to proceed with imaging.  Continue antiplatelet therapy as prescribed for 24 more hours and then discontinue antiplatelet therapy.  I have discussed this extensively with the patient I have contacted Dr. Cari Caraway by phone I have also contacted Dr. Kathlene Cote by phone to arrange for her MRA and review with him the CT scans.  Healthy heart diet, encouraged exercise at least 4 times per week.    A total of 90 minutes was spent with this patient and greater than 50% was spent in counseling and coordination of care with the patient.  Discussion included the treatment options for vascular disease including indications for surgery and intervention.  Also discussed is the appropriate  timing of treatment.  In addition medical therapy was discussed.  - MR MRA NECK W WO CONTRAST; Future  2. Osteoarthritis of spine with radiculopathy, cervical region Patient scheduled for cervical surgery next Wednesday.  I will expedite the MRI I will contact Dr. Cari Caraway with the results I will also contact the patient once the results are available.  3. Essential (primary) hypertension Continue antihypertensive medications as already ordered, these medications have been reviewed and there are no changes at this time.   4. Type 2 diabetes mellitus without complication, with long-term current use of insulin (HCC) Continue hypoglycemic medications as already ordered, these medications have been reviewed and there are no changes at this time.  Hgb A1C to be monitored as already arranged by primary service   5. Gastroesophageal reflux disease, esophagitis presence not specified Continue PPI as already ordered, this medication has been reviewed and  there are no changes at this time.  Avoidence of caffeine and alcohol  Moderate elevation of the head of the bed     Hortencia Pilar, MD  08/04/2018 5:24 PM

## 2018-08-07 ENCOUNTER — Ambulatory Visit (INDEPENDENT_AMBULATORY_CARE_PROVIDER_SITE_OTHER): Payer: Medicaid Other | Admitting: Vascular Surgery

## 2018-08-07 NOTE — Pre-Procedure Instructions (Signed)
[address] [facility] OFFICE. STATES PATIENT CLEARED FOR SURGERY BY DR [facility]. LM FOR LAURA AT VASCULAR TO FAX CLEARANCE NOTE.

## 2018-08-09 ENCOUNTER — Other Ambulatory Visit: Payer: Self-pay

## 2018-08-09 ENCOUNTER — Encounter: Payer: Self-pay | Admitting: *Deleted

## 2018-08-09 ENCOUNTER — Observation Stay
Admission: RE | Admit: 2018-08-09 | Discharge: 2018-08-11 | Disposition: A | Discharge status: Home / Self Care | Payer: Medicaid Other | Source: Ambulatory Visit | Attending: Neurosurgery | Admitting: Neurosurgery

## 2018-08-09 ENCOUNTER — Ambulatory Visit: Payer: Medicaid Other | Admitting: Anesthesiology

## 2018-08-09 ENCOUNTER — Ambulatory Visit: Payer: Medicaid Other

## 2018-08-09 ENCOUNTER — Encounter
Admission: RE | Disposition: A | Discharge status: Home / Self Care | Payer: Self-pay | Source: Ambulatory Visit | Attending: Neurosurgery

## 2018-08-09 DIAGNOSIS — Z794 Long term (current) use of insulin: Secondary | ICD-10-CM | POA: Diagnosis not present

## 2018-08-09 DIAGNOSIS — E1151 Type 2 diabetes mellitus with diabetic peripheral angiopathy without gangrene: Secondary | ICD-10-CM | POA: Insufficient documentation

## 2018-08-09 DIAGNOSIS — M4716 Other spondylosis with myelopathy, lumbar region: Principal | ICD-10-CM | POA: Insufficient documentation

## 2018-08-09 DIAGNOSIS — G4730 Sleep apnea, unspecified: Secondary | ICD-10-CM | POA: Diagnosis not present

## 2018-08-09 DIAGNOSIS — E1142 Type 2 diabetes mellitus with diabetic polyneuropathy: Secondary | ICD-10-CM | POA: Insufficient documentation

## 2018-08-09 DIAGNOSIS — Z6841 Body Mass Index (BMI) 40.0 and over, adult: Secondary | ICD-10-CM | POA: Insufficient documentation

## 2018-08-09 DIAGNOSIS — I10 Essential (primary) hypertension: Secondary | ICD-10-CM | POA: Diagnosis not present

## 2018-08-09 DIAGNOSIS — Z85828 Personal history of other malignant neoplasm of skin: Secondary | ICD-10-CM | POA: Insufficient documentation

## 2018-08-09 DIAGNOSIS — K219 Gastro-esophageal reflux disease without esophagitis: Secondary | ICD-10-CM | POA: Diagnosis not present

## 2018-08-09 DIAGNOSIS — K920 Hematemesis: Secondary | ICD-10-CM | POA: Insufficient documentation

## 2018-08-09 DIAGNOSIS — Z7902 Long term (current) use of antithrombotics/antiplatelets: Secondary | ICD-10-CM | POA: Insufficient documentation

## 2018-08-09 DIAGNOSIS — Z8673 Personal history of transient ischemic attack (TIA), and cerebral infarction without residual deficits: Secondary | ICD-10-CM | POA: Diagnosis not present

## 2018-08-09 DIAGNOSIS — Z87891 Personal history of nicotine dependence: Secondary | ICD-10-CM | POA: Diagnosis not present

## 2018-08-09 DIAGNOSIS — Z7982 Long term (current) use of aspirin: Secondary | ICD-10-CM | POA: Insufficient documentation

## 2018-08-09 DIAGNOSIS — Z79899 Other long term (current) drug therapy: Secondary | ICD-10-CM | POA: Insufficient documentation

## 2018-08-09 DIAGNOSIS — Z419 Encounter for procedure for purposes other than remedying health state, unspecified: Secondary | ICD-10-CM

## 2018-08-09 DIAGNOSIS — Z7951 Long term (current) use of inhaled steroids: Secondary | ICD-10-CM | POA: Diagnosis not present

## 2018-08-09 DIAGNOSIS — G959 Disease of spinal cord, unspecified: Secondary | ICD-10-CM | POA: Diagnosis present

## 2018-08-09 DIAGNOSIS — M4712 Other spondylosis with myelopathy, cervical region: Secondary | ICD-10-CM | POA: Diagnosis present

## 2018-08-09 DIAGNOSIS — Z981 Arthrodesis status: Secondary | ICD-10-CM

## 2018-08-09 DIAGNOSIS — Z96651 Presence of right artificial knee joint: Secondary | ICD-10-CM | POA: Diagnosis not present

## 2018-08-09 DIAGNOSIS — M4722 Other spondylosis with radiculopathy, cervical region: Secondary | ICD-10-CM | POA: Insufficient documentation

## 2018-08-09 DIAGNOSIS — J45909 Unspecified asthma, uncomplicated: Secondary | ICD-10-CM | POA: Insufficient documentation

## 2018-08-09 HISTORY — PX: ANTERIOR CERVICAL DECOMP/DISCECTOMY FUSION: SHX1161

## 2018-08-09 LAB — GLUCOSE, CAPILLARY
Glucose-Capillary: 184 mg/dL — ABNORMAL HIGH (ref 70–99)
Glucose-Capillary: 184 mg/dL — ABNORMAL HIGH (ref 70–99)
Glucose-Capillary: 247 mg/dL — ABNORMAL HIGH (ref 70–99)
Glucose-Capillary: 306 mg/dL — ABNORMAL HIGH (ref 70–99)

## 2018-08-09 LAB — POCT PREGNANCY, URINE: Preg Test, Ur: NEGATIVE

## 2018-08-09 LAB — ABO/RH: ABO/RH(D): O NEG

## 2018-08-09 SURGERY — ANTERIOR CERVICAL DECOMPRESSION/DISCECTOMY FUSION 1 LEVEL
Anesthesia: General | Site: Neck

## 2018-08-09 MED ORDER — PHENOL 1.4 % MT LIQD
1.0000 | OROMUCOSAL | Status: DC | PRN
  Filled 2018-08-09: qty 177

## 2018-08-09 MED ORDER — METFORMIN HCL 500 MG PO TABS
1000.0000 mg | ORAL_TABLET | Freq: Two times a day (BID) | ORAL | Status: DC
  Administered 2018-08-09 – 2018-08-11 (×4): 1000 mg via ORAL
  Filled 2018-08-09 (×5): qty 2

## 2018-08-09 MED ORDER — ALBUTEROL SULFATE HFA 108 (90 BASE) MCG/ACT IN AERS: 2.0000 | INHALATION_SPRAY | Freq: Four times a day (QID) | RESPIRATORY_TRACT | Status: DC | PRN

## 2018-08-09 MED ORDER — INSULIN ASPART 100 UNIT/ML ~~LOC~~ SOLN
10.0000 [IU] | Freq: Three times a day (TID) | SUBCUTANEOUS | Status: DC
  Administered 2018-08-09 – 2018-08-11 (×5): 10 [IU] via SUBCUTANEOUS
  Filled 2018-08-09 (×5): qty 1

## 2018-08-09 MED ORDER — SUCCINYLCHOLINE CHLORIDE 20 MG/ML IJ SOLN
INTRAMUSCULAR | Status: AC
  Filled 2018-08-09: qty 1

## 2018-08-09 MED ORDER — SEVOFLURANE IN SOLN
RESPIRATORY_TRACT | Status: AC
  Filled 2018-08-09: qty 250

## 2018-08-09 MED ORDER — ONDANSETRON HCL 4 MG/2ML IJ SOLN
INTRAMUSCULAR | Status: AC
  Filled 2018-08-09: qty 2

## 2018-08-09 MED ORDER — PROPOFOL 500 MG/50ML IV EMUL
INTRAVENOUS | Status: AC
  Filled 2018-08-09: qty 50

## 2018-08-09 MED ORDER — FENTANYL CITRATE (PF) 100 MCG/2ML IJ SOLN
INTRAMUSCULAR | Status: DC | PRN
  Administered 2018-08-09: 50 ug via INTRAVENOUS
  Administered 2018-08-09: 100 ug via INTRAVENOUS

## 2018-08-09 MED ORDER — BUDESONIDE 0.5 MG/2ML IN SUSP
0.5000 mg | Freq: Two times a day (BID) | RESPIRATORY_TRACT | Status: DC
  Administered 2018-08-10 – 2018-08-11 (×3): 0.5 mg via RESPIRATORY_TRACT
  Filled 2018-08-09 (×3): qty 2

## 2018-08-09 MED ORDER — METHOCARBAMOL 500 MG PO TABS
500.0000 mg | ORAL_TABLET | Freq: Four times a day (QID) | ORAL | Status: DC | PRN
  Administered 2018-08-09 (×2): 500 mg via ORAL
  Filled 2018-08-09 (×3): qty 1

## 2018-08-09 MED ORDER — ATORVASTATIN CALCIUM 20 MG PO TABS
40.0000 mg | ORAL_TABLET | Freq: Every day | ORAL | Status: DC
  Administered 2018-08-09 – 2018-08-10 (×2): 40 mg via ORAL
  Filled 2018-08-09 (×2): qty 2

## 2018-08-09 MED ORDER — FUROSEMIDE 40 MG PO TABS
40.0000 mg | ORAL_TABLET | Freq: Every day | ORAL | Status: DC
  Administered 2018-08-09 – 2018-08-10 (×2): 40 mg via ORAL
  Filled 2018-08-09 (×3): qty 1

## 2018-08-09 MED ORDER — REMIFENTANIL HCL 1 MG IV SOLR
INTRAVENOUS | Status: AC
  Filled 2018-08-09: qty 1000

## 2018-08-09 MED ORDER — DOCUSATE SODIUM 100 MG PO CAPS: 100.0000 mg | ORAL_CAPSULE | Freq: Two times a day (BID) | ORAL | Status: DC | PRN

## 2018-08-09 MED ORDER — SUCCINYLCHOLINE CHLORIDE 20 MG/ML IJ SOLN
INTRAMUSCULAR | Status: DC | PRN
  Administered 2018-08-09: 150 mg via INTRAVENOUS

## 2018-08-09 MED ORDER — MENTHOL 3 MG MT LOZG
1.0000 | LOZENGE | OROMUCOSAL | Status: DC | PRN
  Administered 2018-08-10: 3 mg via ORAL
  Filled 2018-08-09 (×2): qty 9

## 2018-08-09 MED ORDER — FAMOTIDINE 20 MG PO TABS
ORAL_TABLET | ORAL | Status: AC
  Administered 2018-08-09: 20 mg via ORAL
  Filled 2018-08-09: qty 1

## 2018-08-09 MED ORDER — PROPOFOL 10 MG/ML IV BOLUS
INTRAVENOUS | Status: AC
  Filled 2018-08-09: qty 20

## 2018-08-09 MED ORDER — OXYCODONE HCL 5 MG PO TABS: 5.0000 mg | ORAL_TABLET | ORAL | Status: DC | PRN

## 2018-08-09 MED ORDER — PHENYLEPHRINE HCL 10 MG/ML IJ SOLN
INTRAMUSCULAR | Status: DC | PRN
  Administered 2018-08-09: 100 ug via INTRAVENOUS

## 2018-08-09 MED ORDER — PROPOFOL 10 MG/ML IV BOLUS
INTRAVENOUS | Status: DC | PRN
  Administered 2018-08-09: 160 mg via INTRAVENOUS

## 2018-08-09 MED ORDER — INSULIN GLARGINE 100 UNIT/ML ~~LOC~~ SOLN
20.0000 [IU] | Freq: Every day | SUBCUTANEOUS | Status: DC
  Administered 2018-08-09 – 2018-08-10 (×2): 20 [IU] via SUBCUTANEOUS
  Filled 2018-08-09 (×3): qty 0.2

## 2018-08-09 MED ORDER — ALBUTEROL SULFATE (2.5 MG/3ML) 0.083% IN NEBU: 2.5000 mg | INHALATION_SOLUTION | Freq: Four times a day (QID) | RESPIRATORY_TRACT | Status: DC | PRN

## 2018-08-09 MED ORDER — ACETAMINOPHEN 10 MG/ML IV SOLN: 1000.0000 mg | Freq: Once | INTRAVENOUS | Status: DC | PRN

## 2018-08-09 MED ORDER — HYDROMORPHONE HCL 1 MG/ML IJ SOLN
0.5000 mg | INTRAMUSCULAR | Status: DC | PRN
  Administered 2018-08-09: 0.5 mg via INTRAVENOUS
  Filled 2018-08-09: qty 1

## 2018-08-09 MED ORDER — ACETAMINOPHEN 500 MG PO TABS
1000.0000 mg | ORAL_TABLET | Freq: Four times a day (QID) | ORAL | Status: AC
  Administered 2018-08-09 – 2018-08-10 (×4): 1000 mg via ORAL
  Filled 2018-08-09 (×4): qty 2

## 2018-08-09 MED ORDER — LIDOCAINE HCL (CARDIAC) PF 100 MG/5ML IV SOSY
PREFILLED_SYRINGE | INTRAVENOUS | Status: DC | PRN
  Administered 2018-08-09: 60 mg via INTRAVENOUS

## 2018-08-09 MED ORDER — SODIUM CHLORIDE 0.9 % IV SOLN
INTRAVENOUS | Status: DC | PRN
  Administered 2018-08-09: 10:00:00 via INTRAVENOUS

## 2018-08-09 MED ORDER — LISINOPRIL 20 MG PO TABS
40.0000 mg | ORAL_TABLET | Freq: Every evening | ORAL | Status: DC
  Administered 2018-08-09 – 2018-08-10 (×2): 40 mg via ORAL
  Filled 2018-08-09 (×2): qty 2

## 2018-08-09 MED ORDER — SODIUM CHLORIDE 0.9% FLUSH
3.0000 mL | Freq: Two times a day (BID) | INTRAVENOUS | Status: DC
  Administered 2018-08-09 – 2018-08-10 (×3): 3 mL via INTRAVENOUS

## 2018-08-09 MED ORDER — SENNOSIDES-DOCUSATE SODIUM 8.6-50 MG PO TABS: 1.0000 | ORAL_TABLET | Freq: Every evening | ORAL | Status: DC | PRN

## 2018-08-09 MED ORDER — INSULIN ASPART 100 UNIT/ML ~~LOC~~ SOLN
0.0000 [IU] | Freq: Three times a day (TID) | SUBCUTANEOUS | Status: DC
  Administered 2018-08-09: 5 [IU] via SUBCUTANEOUS
  Administered 2018-08-10: 2 [IU] via SUBCUTANEOUS
  Administered 2018-08-10 (×2): 5 [IU] via SUBCUTANEOUS
  Administered 2018-08-11: 2 [IU] via SUBCUTANEOUS
  Filled 2018-08-09 (×5): qty 1

## 2018-08-09 MED ORDER — OXYCODONE HCL 5 MG PO TABS
10.0000 mg | ORAL_TABLET | ORAL | Status: DC | PRN
  Administered 2018-08-09 – 2018-08-11 (×8): 10 mg via ORAL
  Filled 2018-08-09 (×8): qty 2

## 2018-08-09 MED ORDER — FENTANYL CITRATE (PF) 100 MCG/2ML IJ SOLN
INTRAMUSCULAR | Status: AC
  Administered 2018-08-09: 25 ug via INTRAVENOUS
  Filled 2018-08-09: qty 2

## 2018-08-09 MED ORDER — METHOCARBAMOL 1000 MG/10ML IJ SOLN
500.0000 mg | Freq: Four times a day (QID) | INTRAVENOUS | Status: DC | PRN
  Administered 2018-08-09: 500 mg via INTRAVENOUS
  Filled 2018-08-09: qty 5

## 2018-08-09 MED ORDER — SODIUM CHLORIDE 0.9 % IV SOLN
INTRAVENOUS | Status: DC | PRN
  Administered 2018-08-09: 20 ug/min via INTRAVENOUS

## 2018-08-09 MED ORDER — BUPIVACAINE-EPINEPHRINE (PF) 0.5% -1:200000 IJ SOLN
INTRAMUSCULAR | Status: DC | PRN
  Administered 2018-08-09: 10 mL

## 2018-08-09 MED ORDER — TRAZODONE HCL 50 MG PO TABS
50.0000 mg | ORAL_TABLET | Freq: Every day | ORAL | Status: DC
  Administered 2018-08-09 – 2018-08-10 (×2): 50 mg via ORAL
  Filled 2018-08-09 (×2): qty 1

## 2018-08-09 MED ORDER — SODIUM CHLORIDE 0.9 % IV SOLN
INTRAVENOUS | Status: DC
  Administered 2018-08-09: 14:00:00 via INTRAVENOUS

## 2018-08-09 MED ORDER — LIDOCAINE HCL (PF) 2 % IJ SOLN
INTRAMUSCULAR | Status: AC
  Filled 2018-08-09: qty 10

## 2018-08-09 MED ORDER — LINAGLIPTIN 5 MG PO TABS: 5.0000 mg | ORAL_TABLET | Freq: Every day | ORAL | Status: DC

## 2018-08-09 MED ORDER — FAMOTIDINE 20 MG PO TABS
20.0000 mg | ORAL_TABLET | Freq: Once | ORAL | Status: AC
  Administered 2018-08-09: 20 mg via ORAL

## 2018-08-09 MED ORDER — PROPOFOL 500 MG/50ML IV EMUL
INTRAVENOUS | Status: DC | PRN
  Administered 2018-08-09: 100 ug/kg/min via INTRAVENOUS

## 2018-08-09 MED ORDER — THROMBIN 5000 UNITS EX SOLR
CUTANEOUS | Status: DC | PRN
  Administered 2018-08-09: 5000 [IU] via TOPICAL

## 2018-08-09 MED ORDER — ACETAMINOPHEN 325 MG PO TABS: 650.0000 mg | ORAL_TABLET | ORAL | Status: DC | PRN

## 2018-08-09 MED ORDER — ACETAMINOPHEN 650 MG RE SUPP: 650.0000 mg | RECTAL | Status: DC | PRN

## 2018-08-09 MED ORDER — ONDANSETRON HCL 4 MG/2ML IJ SOLN
4.0000 mg | Freq: Four times a day (QID) | INTRAMUSCULAR | Status: DC | PRN
  Administered 2018-08-09 – 2018-08-11 (×5): 4 mg via INTRAVENOUS
  Filled 2018-08-09 (×5): qty 2

## 2018-08-09 MED ORDER — DEXAMETHASONE SODIUM PHOSPHATE 10 MG/ML IJ SOLN
INTRAMUSCULAR | Status: AC
  Filled 2018-08-09: qty 1

## 2018-08-09 MED ORDER — DEXAMETHASONE SODIUM PHOSPHATE 10 MG/ML IJ SOLN
INTRAMUSCULAR | Status: DC | PRN
  Administered 2018-08-09: 4 mg via INTRAVENOUS

## 2018-08-09 MED ORDER — ONDANSETRON HCL 4 MG/2ML IJ SOLN
INTRAMUSCULAR | Status: DC | PRN
  Administered 2018-08-09: 4 mg via INTRAVENOUS

## 2018-08-09 MED ORDER — SODIUM CHLORIDE 0.9 % IV SOLN: 250.0000 mL | INTRAVENOUS | Status: DC

## 2018-08-09 MED ORDER — VANCOMYCIN HCL 10 G IV SOLR
1500.0000 mg | Freq: Once | INTRAVENOUS | Status: AC
  Administered 2018-08-09: 1500 mg via INTRAVENOUS
  Filled 2018-08-09: qty 1500

## 2018-08-09 MED ORDER — EXENATIDE ER 2 MG ~~LOC~~ PEN: 2.0000 mg | PEN_INJECTOR | SUBCUTANEOUS | Status: DC

## 2018-08-09 MED ORDER — SODIUM CHLORIDE 0.9 % IV SOLN
INTRAVENOUS | Status: DC
  Administered 2018-08-09: 09:00:00 via INTRAVENOUS

## 2018-08-09 MED ORDER — ONDANSETRON HCL 4 MG PO TABS: 4.0000 mg | ORAL_TABLET | Freq: Four times a day (QID) | ORAL | Status: DC | PRN

## 2018-08-09 MED ORDER — ZOLPIDEM TARTRATE 5 MG PO TABS: 5.0000 mg | ORAL_TABLET | Freq: Every evening | ORAL | Status: DC | PRN

## 2018-08-09 MED ORDER — POTASSIUM CHLORIDE CRYS ER 10 MEQ PO TBCR
10.0000 meq | EXTENDED_RELEASE_TABLET | Freq: Every day | ORAL | Status: DC
  Administered 2018-08-09 – 2018-08-10 (×2): 10 meq via ORAL
  Filled 2018-08-09 (×3): qty 1

## 2018-08-09 MED ORDER — FENTANYL CITRATE (PF) 100 MCG/2ML IJ SOLN
25.0000 ug | INTRAMUSCULAR | Status: DC | PRN
  Administered 2018-08-09 (×4): 25 ug via INTRAVENOUS

## 2018-08-09 MED ORDER — REMIFENTANIL HCL 2 MG IV SOLR
INTRAVENOUS | Status: DC | PRN
  Administered 2018-08-09: .15 ug/kg/min via INTRAVENOUS

## 2018-08-09 MED ORDER — FENTANYL CITRATE (PF) 250 MCG/5ML IJ SOLN
INTRAMUSCULAR | Status: AC
  Filled 2018-08-09: qty 5

## 2018-08-09 MED ORDER — ALBUTEROL SULFATE (2.5 MG/3ML) 0.083% IN NEBU
INHALATION_SOLUTION | RESPIRATORY_TRACT | Status: AC
  Administered 2018-08-09: 2.5 mg
  Filled 2018-08-09: qty 3

## 2018-08-09 MED ORDER — SODIUM CHLORIDE 0.9% FLUSH: 3.0000 mL | INTRAVENOUS | Status: DC | PRN

## 2018-08-09 MED ORDER — GABAPENTIN 300 MG PO CAPS
300.0000 mg | ORAL_CAPSULE | Freq: Three times a day (TID) | ORAL | Status: DC
  Administered 2018-08-09 – 2018-08-11 (×6): 300 mg via ORAL
  Filled 2018-08-09 (×6): qty 1

## 2018-08-09 MED ORDER — SODIUM CHLORIDE 0.9 % IR SOLN
Status: DC | PRN
  Administered 2018-08-09: 1000 mL

## 2018-08-09 SURGICAL SUPPLY — 57 items
ALLOGRAFT LORDOTIC CC 7X11X14 (Bone Implant) ×2 IMPLANT
BASKET BONE COLLECTION (BASKET) IMPLANT
BULB RESERV EVAC DRAIN JP 100C (MISCELLANEOUS) IMPLANT
BUR NEURO DRILL SOFT 3.0X3.8M (BURR) ×2 IMPLANT
CANISTER SUCT 1200ML W/VALVE (MISCELLANEOUS) ×4 IMPLANT
CHLORAPREP W/TINT 26ML (MISCELLANEOUS) ×4 IMPLANT
COUNTER NEEDLE 20/40 LG (NEEDLE) ×2 IMPLANT
COVER LIGHT HANDLE STERIS (MISCELLANEOUS) ×4 IMPLANT
CRADLE LAMINECT ARM (MISCELLANEOUS) ×2 IMPLANT
CUP MEDICINE 2OZ PLAST GRAD ST (MISCELLANEOUS) ×2 IMPLANT
DERMABOND ADVANCED (GAUZE/BANDAGES/DRESSINGS) ×1
DERMABOND ADVANCED .7 DNX12 (GAUZE/BANDAGES/DRESSINGS) ×1 IMPLANT
DRAIN CHANNEL JP 10F RND 20C F (MISCELLANEOUS) IMPLANT
DRAPE C-ARM 42X72 X-RAY (DRAPES) ×4 IMPLANT
DRAPE LAPAROTOMY 77X122 PED (DRAPES) ×2 IMPLANT
DRAPE MICROSCOPE SPINE 48X150 (DRAPES) ×2 IMPLANT
DRAPE POUCH INSTRU U-SHP 10X18 (DRAPES) ×2 IMPLANT
DRAPE SURG 17X11 SM STRL (DRAPES) ×8 IMPLANT
ELECT CAUTERY BLADE TIP 2.5 (TIP) ×2
ELECT REM PT RETURN 9FT ADLT (ELECTROSURGICAL) ×2
ELECTRODE CAUTERY BLDE TIP 2.5 (TIP) ×1 IMPLANT
ELECTRODE REM PT RTRN 9FT ADLT (ELECTROSURGICAL) ×1 IMPLANT
FEE INTRAOP MONITOR IMPULS NCS (MISCELLANEOUS) ×1 IMPLANT
FRAME EYE SHIELD (PROTECTIVE WEAR) ×4 IMPLANT
GLOVE BIO SURGEON STRL SZ 6.5 (GLOVE) ×4 IMPLANT
GLOVE BIOGEL PI IND STRL 7.0 (GLOVE) ×1 IMPLANT
GLOVE BIOGEL PI INDICATOR 7.0 (GLOVE) ×1
GLOVE SURG SYN 8.5  E (GLOVE) ×3
GLOVE SURG SYN 8.5 E (GLOVE) ×3 IMPLANT
GOWN SRG XL LVL 3 NONREINFORCE (GOWNS) ×1 IMPLANT
GOWN STRL NON-REIN TWL XL LVL3 (GOWNS) ×1
GOWN STRL REUS W/TWL MED LVL3 (GOWN DISPOSABLE) ×2 IMPLANT
GRADUATE 1200CC STRL 31836 (MISCELLANEOUS) ×2 IMPLANT
INTRAOP MONITOR FEE IMPULS NCS (MISCELLANEOUS) ×1
INTRAOP MONITOR FEE IMPULSE (MISCELLANEOUS) ×1
KIT TURNOVER KIT A (KITS) ×2 IMPLANT
MARKER SKIN DUAL TIP RULER LAB (MISCELLANEOUS) ×4 IMPLANT
NDL SAFETY ECLIPSE 18X1.5 (NEEDLE) ×1 IMPLANT
NEEDLE HYPO 18GX1.5 SHARP (NEEDLE) ×1
NEEDLE HYPO 22GX1.5 SAFETY (NEEDLE) ×2 IMPLANT
NS IRRIG 1000ML POUR BTL (IV SOLUTION) ×2 IMPLANT
PACK LAMINECTOMY NEURO (CUSTOM PROCEDURE TRAY) ×2 IMPLANT
PIN CASPAR 14 (PIN) ×1 IMPLANT
PIN CASPAR 14MM (PIN) ×2
PLATE ARCHON 1-LEVEL 20MM (Plate) ×2 IMPLANT
SCREW ARCHON SD VAR 4.0X15MM (Screw) ×8 IMPLANT
SPOGE SURGIFLO 8M (HEMOSTASIS) ×1
SPONGE KITTNER 5P (MISCELLANEOUS) ×2 IMPLANT
SPONGE SURGIFLO 8M (HEMOSTASIS) ×1 IMPLANT
STAPLER SKIN PROX 35W (STAPLE) IMPLANT
SUT V-LOC 90 ABS DVC 3-0 CL (SUTURE) ×2 IMPLANT
SUT VIC AB 3-0 SH 8-18 (SUTURE) ×2 IMPLANT
SYR 30ML LL (SYRINGE) ×2 IMPLANT
TAPE CLOTH 3X10 WHT NS LF (GAUZE/BANDAGES/DRESSINGS) ×2 IMPLANT
TOWEL OR 17X26 4PK STRL BLUE (TOWEL DISPOSABLE) ×6 IMPLANT
TRAY FOLEY MTR SLVR 16FR STAT (SET/KITS/TRAYS/PACK) IMPLANT
TUBING CONNECTING 10 (TUBING) ×2 IMPLANT

## 2018-08-09 NOTE — Plan of Care (Signed)
  Problem: Pain Managment: Goal: General experience of comfort will improve Outcome: Not Progressing   Pt feels she is in severe 9/10 pain after pain re-assessment. Pain medications administered as ordered. Will continue to monitor.

## 2018-08-09 NOTE — Progress Notes (Signed)
New Admit  Mental Orientation: A&OX4 Assessment: Sent to [facility] Skin: Surgical incision intact, Skin glue to site. Safety Measures: Pt oriented to unit. Safety measures explained to pt. Bed alarm on. Admission: Complete 1A Orientation: Complete Family: Mother at bedside

## 2018-08-09 NOTE — Discharge Summary (Signed)
Procedure: C4-5 ACDF Procedure Date: 08/09/2018 Diagnosis: cervical myelopathy  History: Brenda Hicks is here for C4-5 ACDF. Tolerated procedure well without complication. Seen in post-op recovery still disoriented from anesthesia but able to communicate and obey commands.  Unable to determine at this time if symptoms have resolved following procedure.  Complains of posterior cervical pain at this time.  No issues with swallowing.  Physical Exam: Vitals:   08/09/18 0856  BP: (!) 150/93  Pulse: 79  Resp: 16  Temp: (!) 97 F (36.1 C)  SpO2: 100%    AA Ox3 Skin: no bleeding at incision site. Glue intact Strength:5/5 throughout upper and lower extremities Sensation: Intact and symmetric upper and lower extremities  Data:  No results for input(s): NA, K, CL, CO2, BUN, CREATININE, LABGLOM, GLUCOSE, CALCIUM in the last 168 hours. No results for input(s): AST, ALT, ALKPHOS in the last 168 hours.  Invalid input(s): TBILI   No results for input(s): WBC, HGB, HCT, PLT in the last 168 hours. No results for input(s): APTT, INR in the last 168 hours.       Other tests/results: Cervical xrays pending.   Assessment/Plan:  Brenda Hicks is POD0 s/p C4-5 ACDF for cervical myelopathy. Will continue to observe progress and provide pain management with tylenol, oxycodone, and robaxin.     - mobilize - pain control - DVT prophylaxis - imaging   Marin Olp PA-C Department of Neurosurgery

## 2018-08-09 NOTE — Anesthesia Procedure Notes (Signed)
Procedure Name: Intubation Performed by: Kino Dunsworth, CRNA Pre-anesthesia Checklist: Patient identified, Patient being monitored, Timeout performed, Emergency Drugs available and Suction available Patient Re-evaluated:Patient Re-evaluated prior to induction Oxygen Delivery Method: Circle system utilized Preoxygenation: Pre-oxygenation with 100% oxygen Induction Type: IV induction Ventilation: Mask ventilation without difficulty and Oral airway inserted - appropriate to patient size Laryngoscope Size: 3 and McGraph Grade View: Grade I Tube type: Oral Tube size: 7.0 mm Number of attempts: 1 Airway Equipment and Method: Stylet,  LTA kit utilized and Video-laryngoscopy Placement Confirmation: ETT inserted through vocal cords under direct vision,  positive ETCO2 and breath sounds checked- equal and bilateral Secured at: 21 cm Tube secured with: Tape Dental Injury: Teeth and Oropharynx as per pre-operative assessment        

## 2018-08-09 NOTE — Discharge Instructions (Addendum)
Your surgeon has performed an operation on your cervical spine (neck) to relieve pressure on the spinal cord and/or nerves. This involved making an incision in the front of your neck and removing one or more of the discs that support your spine. Next, a small piece of bone, a titanium plate, and screws were used to fuse two or more of the vertebrae (bones) together.  The following are instructions to help in your recovery once you have been discharged from the hospital. Even if you feel well, it is important that you follow these activity guidelines. If you do not let your neck heal properly from the surgery, you can increase the chance of return of your symptoms and other complications.  * Do not take anti-inflammatory medications for 3 months after surgery (naproxen [Aleve], ibuprofen [Advil, Motrin], celecoxib [Celebrex], etc.). These medications can prevent your bones from healing properly. * You may resume aspirin 7 days following surgery date  Activity    No bending, lifting, or twisting (BLT). Avoid lifting objects heavier than 10 pounds (gallon milk jug).  Where possible, avoid household activities that involve lifting, bending, reaching, pushing, or pulling such as laundry, vacuuming, grocery shopping, and childcare. Try to arrange for help from friends and family for these activities while your back heals.  Increase physical activity slowly as tolerated.  Taking short walks is encouraged, but avoid strenuous exercise. Do not jog, run, bicycle, lift weights, or participate in any other exercises unless specifically allowed by your doctor.  Talk to your doctor before resuming sexual activity.  You should not drive until cleared by your doctor.  Until released by your doctor, you should not return to work or school.  You should rest at home and let your body heal.   You may shower three days after your surgery.  After showering, lightly dab your incision dry. Do not take a tub bath or go  swimming until approved by your doctor at your follow-up appointment.  If your doctor ordered a cervical collar (neck brace) for you, you should wear it whenever you are out of bed. You may remove it when lying down or sleeping, but you should wear it at all other times. Not all neck surgeries require a cervical collar.  If you smoke, we strongly recommend that you quit.  Smoking has been proven to interfere with normal bone healing and will dramatically reduce the success rate of your surgery. Please contact [facility] (800-QUIT-NOW) and use the resources at www.[facility].com for assistance in stopping smoking.  Surgical Incision   If you have a dressing on your incision, you may remove it two days after your surgery. Keep your incision area clean and dry.  If you have staples or stitches on your incision, you should have a follow up scheduled for removal. If you do not have staples or stitches, you will have steri-strips (small pieces of surgical tape) or Dermabond glue. The steri-strips/glue should begin to peel away within about a week (it is fine if the steri-strips fall off before then). If the strips are still in place one week after your surgery, you may gently remove them.  Diet           You may return to your usual diet. However, you may experience discomfort when swallowing in the first month after your surgery. This is normal. You may find that softer foods are more comfortable for you to swallow. Be sure to stay hydrated.  When to Contact us  You may  experience pain in your neck and/or pain between your shoulder blades. This is normal and should improve in the next few weeks with the help of pain medication, muscle relaxers, and rest. Some patients report that a warm compress on the back of the neck or between the shoulder blades helps.  However, should you experience any of the following, contact us immediately:  New numbness or weakness  Pain that is progressively getting  worse, and is not relieved by your pain medication, muscle relaxers, rest, and warm compresses  Bleeding, redness, swelling, pain, or drainage from surgical incision  Chills or flu-like symptoms  Fever greater than 101.0 F (38.3 C)  Inability to eat, drink fluids, or take medications  Problems with bowel or bladder functions  Difficulty breathing or shortness of breath  Warmth, tenderness, or swelling in your calf Contact Information  During office hours (Monday-Friday 9 am to 5 pm), please call your physician at [phone] and ask for Berdine Addison  After hours and weekends, please call the [facility] Operator at [phone] and ask for the Neurosurgery Resident On Call   For a life-threatening emergency, call 911

## 2018-08-09 NOTE — Anesthesia Post-op Follow-up Note (Signed)
Anesthesia QCDR form completed.        

## 2018-08-09 NOTE — Transfer of Care (Signed)
Immediate Anesthesia Transfer of Care Note  Patient: Brenda Hicks  Procedure(s) Performed: ANTERIOR CERVICAL DECOMPRESSION/DISCECTOMY FUSION 1 LEVEL- C4-5 (N/A Neck)  Patient Location: PACU  Anesthesia Type:General  Level of Consciousness: awake and responds to stimulation  Airway & Oxygen Therapy: Patient Spontanous Breathing and Patient connected to face mask oxygen  Post-op Assessment: Report given to RN and Post -op Vital signs reviewed and stable  Post vital signs: Reviewed and stable  Last Vitals:  Vitals Value Taken Time  BP 179/88 08/09/2018 11:44 AM  Temp    Pulse 88 08/09/2018 11:44 AM  Resp 7 08/09/2018 11:44 AM  SpO2 100 % 08/09/2018 11:44 AM    Last Pain:  Vitals:   08/09/18 0856  TempSrc: Tympanic  PainSc: 9          Complications: No apparent anesthesia complications

## 2018-08-09 NOTE — H&P (Signed)
I have reviewed and confirmed my history and physical from 07/27/2018 with no additions or changes. Plan for ACDF C4-5.  Risks and benefits reviewed.    Heart sounds normal no MRG. Chest Clear to Auscultation Bilaterally.

## 2018-08-09 NOTE — Anesthesia Preprocedure Evaluation (Addendum)
Anesthesia Evaluation  Patient identified by MRN, date of birth, ID band Patient awake    Reviewed: Allergy & Precautions, H&P , NPO status , Patient's Chart, lab work & pertinent test results  Airway Mallampati: II  TM Distance: >3 FB Neck ROM: limited    Dental  (+) Teeth Intact   Pulmonary neg pulmonary ROS, asthma , sleep apnea and Continuous Positive Airway Pressure Ventilation , former smoker,    breath sounds clear to auscultation       Cardiovascular hypertension, + Peripheral Vascular Disease  negative cardio ROS   Rhythm:regular Rate:Normal     Neuro/Psych  Neuromuscular disease (diabetic peripheral neuropathy) CVA negative neurological ROS  negative psych ROS   GI/Hepatic negative GI ROS, Neg liver ROS, GERD  ,  Endo/Other  negative endocrine ROSdiabetesMorbid obesity  Renal/GU Renal disease (renal cysts)     Musculoskeletal   Abdominal   Peds  Hematology negative hematology ROS (+) anemia ,   Anesthesia Other Findings Past Medical History: No date: Arthritis No date: Asthma     Comment:  WELL CONTROLLED 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     Comment:  skin cancer left ear No date: Diabetes mellitus without complication (HCC) No date: Fatty liver No date: Hypertension No date: Kidney cysts No date: Renal disorder No date: Sleep apnea     Comment:  USES CPAP May or June 2019: Stroke [facility])     Comment:  TIA  Past Surgical History: No date: DILATION AND CURETTAGE OF UTERUS No date: ENDOMETRIAL BIOPSY No date: EXPLORATORY LAPAROTOMY     Comment:  REMOVAL OF RUPTURED ECTOPIC No date: HAND SURGERY No date: HERNIA REPAIR     Comment:  UMBILICAL No date: JOINT REPLACEMENT; Right     Comment:  TKR No date: KNEE ARTHROSCOPY No date: KNEE SURGERY; Right 12/14/2016: SHOULDER ARTHROSCOPY WITH BICEPSTENOTOMY; Left     Comment:  Procedure: SHOULDER ARTHROSCOPY WITH BICEPSTENOTOMY;                Surgeon: Corky Mull, MD;  Location: [facility] ORS;  Service:              Orthopedics;  Laterality: Left; 12/14/2016: SHOULDER ARTHROSCOPY WITH OPEN ROTATOR CUFF REPAIR; Left     Comment:  Procedure: SHOULDER ARTHROSCOPY WITH OPEN ROTATOR CUFF               REPAIR AND ARTHROSCOPIC ROTATOR CUFF REPAIR;  Surgeon:               Corky Mull, MD;  Location: [facility] ORS;  Service:               Orthopedics;  Laterality: Left; 12/14/2016: SHOULDER ARTHROSCOPY WITH SUBACROMIAL DECOMPRESSION; Left     Comment:  Procedure: SHOULDER ARTHROSCOPY WITH SUBACROMIAL               DECOMPRESSION;  Surgeon: Corky Mull, MD;  Location:               [facility] ORS;  Service: Orthopedics;  Laterality: Left; No date: TUBAL LIGATION  BMI    Body Mass Index:  53.93 kg/m      Reproductive/Obstetrics negative OB ROS                            Anesthesia Physical Anesthesia Plan  ASA: IV  Anesthesia Plan: General ETT   Post-op Pain Management:    Induction:   PONV Risk Score  and Plan: 3 and Ondansetron and Dexamethasone  Airway Management Planned:   Additional Equipment:   Intra-op Plan:   Post-operative Plan:   Informed Consent: I have reviewed the patients History and Physical, chart, labs and discussed the procedure including the risks, benefits and alternatives for the proposed anesthesia with the patient or authorized representative who has indicated his/her understanding and acceptance.   Dental Advisory Given  Plan Discussed with: Anesthesiologist, CRNA and Surgeon  Anesthesia Plan Comments:         Anesthesia Quick Evaluation

## 2018-08-09 NOTE — Op Note (Addendum)
Indications: Ms. Schuff is a 52 yo female who presented with worsening cervical myelopathy.  She had worsening symptoms so was recommended to proceed with surgery.  Findings: cervical stenosis C4-5.  Preoperative Diagnosis: Cervical myelopathy Postoperative Diagnosis: same   EBL: 15 ml IVF: 250 ml Drains: none Disposition: Extubated and Stable to PACU Complications: none  No foley catheter was placed.   Preoperative Note:   Risks of surgery discussed include: infection, bleeding, stroke, coma, death, paralysis, CSF leak, nerve/spinal cord injury, numbness, tingling, weakness, complex regional pain syndrome, recurrent stenosis and/or disc herniation, vascular injury, development of instability, neck/back pain, need for further surgery, persistent symptoms, development of deformity, and the risks of anesthesia. The patient understood these risks and agreed to proceed.  Procedure:  1) Anterior cervical diskectomy and fusion at C4-5 2) Anterior cervical instrumentation at C4-5 3) Structural allograft consisting of corticocancellous allograft   Procedure: After obtaining informed consent, the patient taken to the operating room, placed in supine position, general anesthesia induced.  The patient had a small shoulder roll placed behind their shoulders.  The patient received preop antibiotics and IV Decadron.  The patient had a neck incision outlined, was prepped and draped in usual sterile fashion. The incision was injected with local anesthetic.   An incision was opened, dissection taken down medial to the carotid artery and jugular vein, lateral to the trachea and esophagus.  The prevertebral fascia identified and a localizing x-ray demonstrated the correct level.  The longus colli were dissected laterally, and self-retaining retractors placed to open the operative field. The microscope was then brought into the field.  With this complete, distractor pins were placed in the vertebral bodies of  C4 and C5. The distractor was placed, and the anulus at C4/5 was opened using a bovie.  Curettes and pituitary rongeurs used to remove the majority of disk, then the drill was used to remove the posterior osteophyte and begin the foraminotomies. The nerve hook was used to elevate the posterior longitudinal ligament, which was then removed with Kerrison rongeurs. The microblunt nerve hook could be passed out the foramen bilaterally.   Meticulous hemostasis obtained.  Structural allograft coated with demineralized bone matrix was tapped behind the anterior lip of the vertebral body at C4/5 (7 mm).    The caspar distractor was removed, and bone wax used for hemostasis. A separate, 20 mm Nuvasive Archon plate was chosen.  Two screws placed in each vertebral body, respectively making sure the screws were behind the locking mechanism.  Final AP and lateral radiographs were taken.   With everything in good position, the wound was irrigated copiously with bacitracin-containing solution and meticulous hemostasis obtained.  Wound was closed in 2 layers using interrupted inverted 3-0 Vicryl sutures.  The wound was dressed with dermabond, the head of bed at 30 degrees, taken to recovery room in stable condition.  No new postop neurological deficits were identified.  Sponge and pattie counts were correct at the end of the procedure.   Monitoring was used throughout without any changes.   I performed the entire procedure with Marin Olp PA as an Pensions consultant.  Meade Maw MD

## 2018-08-10 ENCOUNTER — Observation Stay: Payer: Medicaid Other

## 2018-08-10 ENCOUNTER — Encounter: Payer: Self-pay | Admitting: Neurosurgery

## 2018-08-10 ENCOUNTER — Ambulatory Visit (INDEPENDENT_AMBULATORY_CARE_PROVIDER_SITE_OTHER): Payer: Medicaid Other | Admitting: Vascular Surgery

## 2018-08-10 DIAGNOSIS — M4716 Other spondylosis with myelopathy, lumbar region: Secondary | ICD-10-CM | POA: Diagnosis not present

## 2018-08-10 LAB — GLUCOSE, CAPILLARY
GLUCOSE-CAPILLARY: 138 mg/dL — AB (ref 70–99)
Glucose-Capillary: 206 mg/dL — ABNORMAL HIGH (ref 70–99)
Glucose-Capillary: 210 mg/dL — ABNORMAL HIGH (ref 70–99)
Glucose-Capillary: 135 mg/dL — ABNORMAL HIGH (ref 70–99)

## 2018-08-10 MED ORDER — METHOCARBAMOL 1000 MG/10ML IJ SOLN
500.0000 mg | Freq: Four times a day (QID) | INTRAVENOUS | Status: DC
  Filled 2018-08-10: qty 5

## 2018-08-10 MED ORDER — PROCHLORPERAZINE MALEATE 10 MG PO TABS
10.0000 mg | ORAL_TABLET | Freq: Four times a day (QID) | ORAL | Status: DC | PRN
  Administered 2018-08-11: 10 mg via ORAL
  Filled 2018-08-10 (×2): qty 1

## 2018-08-10 MED ORDER — KETOROLAC TROMETHAMINE 15 MG/ML IJ SOLN
15.0000 mg | Freq: Three times a day (TID) | INTRAMUSCULAR | Status: AC
  Administered 2018-08-10 (×3): 15 mg via INTRAVENOUS
  Filled 2018-08-10 (×3): qty 1

## 2018-08-10 MED ORDER — METHOCARBAMOL 500 MG PO TABS
750.0000 mg | ORAL_TABLET | Freq: Four times a day (QID) | ORAL | Status: DC
  Administered 2018-08-10 – 2018-08-11 (×5): 750 mg via ORAL
  Filled 2018-08-10 (×5): qty 2

## 2018-08-10 MED ORDER — PROCHLORPERAZINE MALEATE 10 MG PO TABS
10.0000 mg | ORAL_TABLET | Freq: Once | ORAL | Status: AC
  Administered 2018-08-10: 10 mg via ORAL
  Filled 2018-08-10: qty 1

## 2018-08-10 MED ORDER — SCOPOLAMINE 1 MG/3DAYS TD PT72
1.0000 | MEDICATED_PATCH | TRANSDERMAL | Status: DC
  Administered 2018-08-10: 1.5 mg via TRANSDERMAL
  Filled 2018-08-10: qty 1

## 2018-08-10 NOTE — Progress Notes (Signed)
Procedure: C4-5 ACDF Procedure Date: 08/09/2018 Diagnosis: cervical myelopathy  History: Brenda Hicks is POD1 s/p C4-5 ACDF. Symptoms prior to surgery have resolved. Complains of 8/10 posterior cervical pain that extends through thoracic region. Also complains that she vomited blood last night and vomited again this morning. Experiencing some issues with swallowing solid food, but is tolerating soft foods without issue.  She has ambulated and voided without issue.   Physical Exam: Vitals:   08/10/18 0357 08/10/18 0728  BP: (!) 142/80 (!) 151/94  Pulse: 88 82  Resp: 19 16  Temp: 97.7 F (36.5 C) (!) 97.4 F (36.3 C)  SpO2: 99% 99%    AA Ox3 Skin: no bleeding at incision site. Glue intact Strength:5/5 throughout upper and lower extremities Sensation: Intact and symmetric upper and lower extremities  Data:  No results for input(s): NA, K, CL, CO2, BUN, CREATININE, LABGLOM, GLUCOSE, CALCIUM in the last 168 hours. No results for input(s): AST, ALT, ALKPHOS in the last 168 hours.  Invalid input(s): TBILI   No results for input(s): WBC, HGB, HCT, PLT in the last 168 hours. No results for input(s): APTT, INR in the last 168 hours.       Other tests/results:  EXAM: CERVICAL SPINE - 2-3 VIEW 08/10/2018  COMPARISON:  Intraoperative fluoro spot images of August 09, 2018  FINDINGS: The patient has undergone ACDF at C4-5. Radiographic positioning of the fusion hardware appears good. The posterior elements are intact. The vertebral bodies are preserved in height. There is moderate prevertebral soft tissue swelling.  IMPRESSION: No postprocedure complication following ACDF at C4-5.  Assessment/Plan:  Brenda Hicks is POD1 s/p C4-5 ACDF for cervical myelopathy. Symptoms prior to surgery have resolved. Will attempt to control vomiting/nausea with scopolamine patch, increase muscle relaxer to 750mg  for better control of muscle spasms in addition to toradol.  Patient was  agreeable to plan.  Post op images completed.      - mobilize - pain control - DVT prophylaxis  Marin Olp PA-C Department of Neurosurgery

## 2018-08-10 NOTE — Progress Notes (Signed)
Patient vomited approx. 1 tbsp of blood. MD called. Acknowledged. No new orders received.

## 2018-08-10 NOTE — Anesthesia Postprocedure Evaluation (Signed)
Anesthesia Post Note  Patient: Brenda Hicks  Procedure(s) Performed: ANTERIOR CERVICAL DECOMPRESSION/DISCECTOMY FUSION 1 LEVEL- C4-5 (N/A Neck)  Patient location during evaluation: PACU Anesthesia Type: General Level of consciousness: awake and alert Pain management: pain level controlled Vital Signs Assessment: post-procedure vital signs reviewed and stable Respiratory status: spontaneous breathing, nonlabored ventilation and respiratory function stable Cardiovascular status: blood pressure returned to baseline and stable Postop Assessment: no apparent nausea or vomiting Anesthetic complications: no     Last Vitals:  Vitals:   08/10/18 0728 08/10/18 0900  BP: (!) 151/94   Pulse: 82 79  Resp: 16 16  Temp: (!) 36.3 C   SpO2: 99% 97%    Last Pain:  Vitals:   08/10/18 0957  TempSrc:   PainSc: 10-Worst pain ever                 Durenda Hurt

## 2018-08-11 DIAGNOSIS — M4716 Other spondylosis with myelopathy, lumbar region: Secondary | ICD-10-CM | POA: Diagnosis not present

## 2018-08-11 LAB — GLUCOSE, CAPILLARY: Glucose-Capillary: 140 mg/dL — ABNORMAL HIGH (ref 70–99)

## 2018-08-11 MED ORDER — OXYCODONE HCL 5 MG PO TABS: 5.0000 mg | ORAL_TABLET | ORAL | 0 refills | Status: DC | PRN

## 2018-08-11 MED ORDER — PROCHLORPERAZINE MALEATE 10 MG PO TABS: 10.0000 mg | ORAL_TABLET | Freq: Four times a day (QID) | ORAL | 0 refills | Status: DC | PRN

## 2018-08-11 MED ORDER — METHOCARBAMOL 750 MG PO TABS: 750.0000 mg | ORAL_TABLET | Freq: Four times a day (QID) | ORAL | 0 refills | Status: DC

## 2018-08-11 MED ORDER — PROMETHAZINE HCL 25 MG PO TABS: 25.0000 mg | ORAL_TABLET | Freq: Four times a day (QID) | ORAL | 0 refills | Status: DC | PRN

## 2018-08-11 NOTE — Progress Notes (Signed)
Pt ready for discharge home today per MD. Patient assessment unchanged from this morning. Estill Bamberg PA notified pt vomited again after rounds, and pt requested phenergan prescription instead of compazine; order received. Reviewed discharge instructions and prescriptions with pt; all questions answered and pt verbalized understanding. PIV removed, VSS. Pt assisted to car via Therapist, sports.  Ethelda Chick

## 2018-08-11 NOTE — Discharge Summary (Addendum)
Procedure: C4-5 ACDF Procedure Date: 08/09/2018 Diagnosis: cervical myelopathy  History: POD2 She is feeling much better in terms of pain and nausea. She did vomit this morning around 6am but feels it is subsiding with compazine. Still complaining of posterior cervical pain 8/10, but feels this has also improved since yesterday (laying comfortably in bed). Some discomfort when swallowing, but able to tolerate soft food without issue.   POD1  Brenda Hicks is POD1 s/p C4-5 ACDF. Symptoms prior to surgery have resolved. Complains of 8/10 posterior cervical pain that extends through thoracic region. Also complains that she vomited blood last night and vomited again this morning. Experiencing some issues with swallowing solid food, but is tolerating soft foods without issue.  She has ambulated and voided without issue.   Physical Exam: Vitals:   08/11/18 0756 08/11/18 0816  BP: (!) 108/42   Pulse: 71 60  Resp: 18 18  Temp: 97.8 F (36.6 C)   SpO2: 97% 97%    AA Ox3 Skin: Glue intact at incision site.  Strength:5/5 throughout upper extremities Sensation: Intact and symmetric upper extremities  Data:  No results for input(s): NA, K, CL, CO2, BUN, CREATININE, LABGLOM, GLUCOSE, CALCIUM in the last 168 hours. No results for input(s): AST, ALT, ALKPHOS in the last 168 hours.  Invalid input(s): TBILI   No results for input(s): WBC, HGB, HCT, PLT in the last 168 hours. No results for input(s): APTT, INR in the last 168 hours.       Other tests/results:  EXAM: CERVICAL SPINE - 2-3 VIEW 08/10/2018  COMPARISON:  Intraoperative fluoro spot images of August 09, 2018  FINDINGS: The patient has undergone ACDF at C4-5. Radiographic positioning of the fusion hardware appears good. The posterior elements are intact. The vertebral bodies are preserved in height. There is moderate prevertebral soft tissue swelling.  IMPRESSION: No postprocedure complication following ACDF at  C4-5.  Assessment/Plan:  Brenda Hicks is POD2 s/p C4-5 ACDF for cervical myelopathy. Symptoms prior to surgery have resolved. Will continue pain control with tylenol, oxycodone, and robaxin as needed. Will continue compazine for nausea. She will follow up in 2 weeks to  Monitor progress. Advised if any questions or concerns arise to please contact office.     Marin Olp PA-C Department of Neurosurgery

## 2018-08-11 NOTE — Progress Notes (Signed)
Patient has vomited again. She requests to D/c with phenergan since she has been on this before and it has helped her. Prescription sent. Compazine cancelled

## 2018-08-15 ENCOUNTER — Encounter: Payer: Self-pay | Admitting: Emergency Medicine

## 2018-08-15 ENCOUNTER — Emergency Department: Payer: Medicaid Other

## 2018-08-15 ENCOUNTER — Other Ambulatory Visit: Payer: Self-pay

## 2018-08-15 ENCOUNTER — Emergency Department
Admission: EM | Admit: 2018-08-15 | Discharge: 2018-08-15 | Disposition: A | Discharge status: Home / Self Care | Payer: Medicaid Other | Attending: Emergency Medicine | Admitting: Emergency Medicine

## 2018-08-15 DIAGNOSIS — G8928 Other chronic postprocedural pain: Secondary | ICD-10-CM | POA: Insufficient documentation

## 2018-08-15 DIAGNOSIS — Z79899 Other long term (current) drug therapy: Secondary | ICD-10-CM | POA: Diagnosis not present

## 2018-08-15 DIAGNOSIS — R0602 Shortness of breath: Secondary | ICD-10-CM | POA: Diagnosis present

## 2018-08-15 DIAGNOSIS — Z87891 Personal history of nicotine dependence: Secondary | ICD-10-CM | POA: Insufficient documentation

## 2018-08-15 DIAGNOSIS — Z794 Long term (current) use of insulin: Secondary | ICD-10-CM | POA: Insufficient documentation

## 2018-08-15 DIAGNOSIS — Z981 Arthrodesis status: Secondary | ICD-10-CM | POA: Insufficient documentation

## 2018-08-15 DIAGNOSIS — I10 Essential (primary) hypertension: Secondary | ICD-10-CM | POA: Diagnosis not present

## 2018-08-15 DIAGNOSIS — Z7902 Long term (current) use of antithrombotics/antiplatelets: Secondary | ICD-10-CM | POA: Insufficient documentation

## 2018-08-15 DIAGNOSIS — G8918 Other acute postprocedural pain: Secondary | ICD-10-CM

## 2018-08-15 DIAGNOSIS — E1140 Type 2 diabetes mellitus with diabetic neuropathy, unspecified: Secondary | ICD-10-CM | POA: Insufficient documentation

## 2018-08-15 LAB — TROPONIN I: Troponin I: <0.03 ng/mL (ref <0.03)

## 2018-08-15 LAB — BASIC METABOLIC PANEL
ANION GAP: 11 (ref 5–15)
BUN: 16 mg/dL (ref 6–20)
CHLORIDE: 96 mmol/L — AB (ref 98–111)
CO2: 29 mmol/L (ref 22–32)
CREATININE: 0.95 mg/dL (ref 0.44–1.00)
Calcium: 9 mg/dL (ref 8.9–10.3)
GFR calc non Af Amer: >60 mL/min (ref >60)
Glucose, Bld: 254 mg/dL — ABNORMAL HIGH (ref 70–99)
POTASSIUM: 4.3 mmol/L (ref 3.5–5.1)
SODIUM: 136 mmol/L (ref 135–145)

## 2018-08-15 LAB — CBC
HCT: 39.1 % (ref 35.0–47.0)
Hemoglobin: 13.4 g/dL (ref 12.0–16.0)
MCH: 31.8 pg (ref 26.0–34.0)
MCHC: 34.3 g/dL (ref 32.0–36.0)
MCV: 92.6 fL (ref 80.0–100.0)
PLATELETS: 285 10*3/uL (ref 150–440)
RBC: 4.22 MIL/uL (ref 3.80–5.20)
RDW: 14.5 % (ref 11.5–14.5)
WBC: 7.1 10*3/uL (ref 3.6–11.0)

## 2018-08-15 LAB — TSH: TSH: 2.855 u[IU]/mL (ref 0.350–4.500)

## 2018-08-15 LAB — T4, FREE: FREE T4: 0.86 ng/dL (ref 0.82–1.77)

## 2018-08-15 MED ORDER — KETOROLAC TROMETHAMINE 30 MG/ML IJ SOLN
15.0000 mg | INTRAMUSCULAR | Status: AC
  Administered 2018-08-15: 15 mg via INTRAVENOUS
  Filled 2018-08-15: qty 1

## 2018-08-15 MED ORDER — SODIUM CHLORIDE 0.9 % IV BOLUS
1000.0000 mL | Freq: Once | INTRAVENOUS | Status: AC
  Administered 2018-08-15: 1000 mL via INTRAVENOUS

## 2018-08-15 MED ORDER — IOHEXOL 350 MG/ML SOLN
100.0000 mL | Freq: Once | INTRAVENOUS | Status: AC | PRN
  Administered 2018-08-15: 100 mL via INTRAVENOUS

## 2018-08-15 NOTE — ED Triage Notes (Signed)
Patient states that she had surgery on her cervical spine last Wednesday and intubated under general anesthesia and discharged Friday. Reports since then she has had increasing difficulty breathing and sore throat with painful swallowing. Patient states she has pressure across her chest and feels like she can't take a deep breath. Patient also reports feeling a "knot" under her incision that she first noticed yesterday.

## 2018-08-15 NOTE — ED Notes (Signed)
Pt to and from CT. ABCs intact. NAD. 

## 2018-08-15 NOTE — ED Notes (Signed)
Pt given apple juice and crackers/peanut butter for PO challenge. Pt denies needs/complaints. Call bell within reach

## 2018-08-15 NOTE — Discharge Instructions (Addendum)
Your labs and CT scans of the neck and chest today were okay. Please continue taking your medications and follow up with your doctors.

## 2018-08-15 NOTE — ED Provider Notes (Signed)
[facility] Emergency Department Provider Note  ____________________________________________  Time seen: Approximately 1:04 PM  I have reviewed the triage vital signs and the nursing notes.   HISTORY  Chief Complaint Post-op Problem and Shortness of Breath    HPI Brenda Hicks is a 52 y.o. female with a history of asthma diabetes hypertension previous stroke and sleep apnea who recently underwent a ACDF 6 days ago by Dr. Cari Caraway who complains of worsening neck pain and painful swallowing that started 2 days ago.  Denies fever chills or sweats or drainage from the area.  Feels that the surgical area is more swollen.  Pain is constant, worse with swallowing and movement, no alleviating factors.  Nonradiating.  Associated with some generalized headache as well.  Rates pain as severe.  She also complains of shortness of breath and chest pain with deep breathing.  She was extubated immediately postop and hospitalized for an additional 2 days after the surgery.    Past Medical History:  Diagnosis Date  . Arthritis   . Asthma    WELL CONTROLLED  . 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 Diabetes mellitus without complication ([place])   . Fatty liver   . Hypertension   . Kidney cysts   . Renal disorder   . Sleep apnea    USES CPAP  . Stroke [facility]) May or June 2019   TIA     Patient Active Problem List   Diagnosis Date Noted  . Cervical myelopathy ([place]) 08/09/2018  . DJD (degenerative joint disease) of cervical spine 08/04/2018  . 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 Stroke (cerebrum) ([place]) 03/30/2018  . Ischemic chest pain 03/29/2018  . Chest pain 03/29/2018  . Sepsis affecting skin 06/09/2017  . Sebaceous cyst 06/01/2016  . Obstructive apnea 05/31/2016  . HLD (hyperlipidemia) 05/31/2016  . Acid reflux 05/31/2016  . Essential (primary) hypertension 05/31/2016  . Abnormal Pap smear of cervix 05/31/2016  . Arthritis of knee,  degenerative 07/25/2015  . History of artificial joint 07/25/2015  . Gonalgia 02/17/2015  . Type 2 diabetes mellitus ([place]) 10/23/2014  . Otalgia of left ear 09/24/2014  . 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 Diabetic peripheral neuropathy associated with type 2 diabetes mellitus ([place]) 05/16/2014  . Endometrial polyp 11/01/2013  . Fibroids, intramural 10/09/2013  . Pain due to knee joint prosthesis ([place]) 10/05/2013  . Body mass index (BMI) of 50-59.9 in adult ([place]) 10/03/2013  . Abnormal uterine bleeding 10/03/2013  . Adiposity 10/01/2013  . Excessive and frequent menstruation with irregular cycle 10/01/2013  . Adaptive colitis 10/01/2013  . History of migraine headaches 10/01/2013  . H/O malignant neoplasm of skin 10/01/2013  . Fatty liver disease, nonalcoholic 79/12/4095  . Diverticulitis 10/01/2013  . Current tobacco use 08/29/2013  . H/O total knee replacement 08/29/2013  . Cannot sleep 08/29/2013  . Dysmenorrhea 08/29/2013  . Airway hyperreactivity 08/29/2013  . Absolute anemia 08/29/2013  . Tobacco use 08/29/2013     Past Surgical History:  Procedure Laterality Date  . ANTERIOR CERVICAL DECOMP/DISCECTOMY FUSION N/A 08/09/2018   Procedure: ANTERIOR CERVICAL DECOMPRESSION/DISCECTOMY FUSION 1 LEVEL- C4-5;  Surgeon: Meade Maw, MD;  Location: [facility] ORS;  Service: Neurosurgery;  Laterality: N/A;  . DILATION AND CURETTAGE OF UTERUS    . ENDOMETRIAL BIOPSY    . EXPLORATORY LAPAROTOMY     REMOVAL OF RUPTURED ECTOPIC  . HAND SURGERY    .  HERNIA REPAIR     UMBILICAL  . JOINT REPLACEMENT Right    TKR  . KNEE ARTHROSCOPY    . KNEE SURGERY Right   . SHOULDER ARTHROSCOPY WITH BICEPSTENOTOMY Left 12/14/2016   Procedure: SHOULDER ARTHROSCOPY WITH BICEPSTENOTOMY;  Surgeon: Corky Mull, MD;  Location: [facility] ORS;  Service: Orthopedics;  Laterality: Left;  . SHOULDER ARTHROSCOPY WITH OPEN  ROTATOR CUFF REPAIR Left 12/14/2016   Procedure: SHOULDER ARTHROSCOPY WITH OPEN ROTATOR CUFF REPAIR AND ARTHROSCOPIC ROTATOR CUFF REPAIR;  Surgeon: Corky Mull, MD;  Location: [facility] ORS;  Service: Orthopedics;  Laterality: Left;  . SHOULDER ARTHROSCOPY WITH SUBACROMIAL DECOMPRESSION Left 12/14/2016   Procedure: SHOULDER ARTHROSCOPY WITH SUBACROMIAL DECOMPRESSION;  Surgeon: Corky Mull, MD;  Location: [facility] ORS;  Service: Orthopedics;  Laterality: Left;  . TUBAL LIGATION       Prior to Admission medications   Medication Sig Start Date End Date Taking? Authorizing Provider  albuterol (PROVENTIL HFA;VENTOLIN HFA) 108 (90 Base) MCG/ACT inhaler Inhale 2 puffs into the lungs every 6 (six) hours as needed for wheezing or shortness of breath.    [provider]  atorvastatin (LIPITOR) 40 MG tablet Take 1 tablet (40 mg total) by mouth daily. 04/01/18 07/22/20  Nicholes Mango, MD  clopidogrel (PLAVIX) 75 MG tablet Take 1 tablet (75 mg total) by mouth daily. 07/18/18 10/16/18  Darel Hong, MD  docusate sodium (COLACE) 100 MG capsule Take 1 capsule (100 mg total) by mouth 2 (two) times daily as needed for mild constipation. 04/01/18   Nicholes Mango, MD  Elastic Bandages & Supports (MEDICAL COMPRESSION STOCKINGS) MISC 2 medium stockings Use as directed on both lower legs 01/30/18   Arta Silence, MD  Exenatide ER (BYDUREON) 2 MG PEN Inject 2 mg into the skin once a week. On Fridays    [provider]  FLOVENT HFA 220 MCG/ACT inhaler Inhale 2 puffs into the lungs 2 (two) times daily. 01/25/18   [provider]  furosemide (LASIX) 40 MG tablet Take 1 tablet by mouth daily. 01/31/18   [provider]  gabapentin (NEURONTIN) 300 MG capsule Take 300 mg by mouth 3 (three) times daily.     [provider]  insulin aspart (NOVOLOG) 100 UNIT/ML injection Inject 10 Units into the skin 3 (three) times daily with meals. 04/01/18   Gouru, Illene Silver, MD  insulin glargine (LANTUS) 100  UNIT/ML injection Inject 0.2 mLs (20 Units total) into the skin daily. Patient taking differently: Inject 20 Units into the skin at bedtime.  04/02/18   Nicholes Mango, MD  lisinopril (PRINIVIL,ZESTRIL) 40 MG tablet Take 40 mg by mouth every evening.  01/14/16 08/09/18  [provider]  metFORMIN (GLUCOPHAGE) 1000 MG tablet Take 1 tablet by mouth 2 (two) times daily. 01/25/18   [provider]  methocarbamol (ROBAXIN) 750 MG tablet Take 1 tablet (750 mg total) by mouth every 6 (six) hours. 08/11/18   Marin Olp, PA-C  oxyCODONE (OXY IR/ROXICODONE) 5 MG immediate release tablet Take 1 tablet (5 mg total) by mouth every 3 (three) hours as needed for moderate pain ((score 4 to 6)). 08/11/18   Marin Olp, PA-C  potassium chloride (K-DUR) 10 MEQ tablet Take 1 tablet by mouth daily. 01/31/18   [provider]  promethazine (PHENERGAN) 25 MG tablet Take 1 tablet (25 mg total) by mouth every 6 (six) hours as needed for nausea or vomiting. 08/11/18   Marin Olp, PA-C  sitaGLIPtin (JANUVIA) 100 MG tablet Take 100 mg by mouth daily.  Patient reports taking Januvia 100 mg daily    [provider]  STIOLTO RESPIMAT 2.5-2.5 MCG/ACT AERS Take 2 puffs by mouth daily. 01/26/18   [provider]  traZODone (DESYREL) 50 MG tablet Take 1 tablet by mouth at bedtime. 01/25/18   [provider]  zolpidem (AMBIEN) 5 MG tablet Take 1 tablet (5 mg total) by mouth at bedtime as needed for sleep. 12/14/16   Poggi, Marshall Cork, MD     Allergies Patient has no known allergies.   Family History  Problem Relation Age of Onset  . CAD Maternal Grandmother     Social History Social History   Tobacco Use  . Smoking status: Former Smoker    Packs/day: 2.00    Years: 25.00    Pack years: 50.00    Types: Cigarettes  . Smokeless tobacco: Never Used  Substance Use Topics  . Alcohol use: No  . Drug use: No    Review of Systems  Constitutional:   No fever or chills.  ENT:    Positive sore throat and painful swallowing. Cardiovascular:   Positive pleuritic chest pain without syncope. Respiratory: Positive shortness of breath and productive cough. Gastrointestinal:   Negative for abdominal pain, vomiting and diarrhea.  Musculoskeletal:   Negative for focal pain or swelling All other systems reviewed and are negative except as documented above in ROS and HPI.  ____________________________________________   PHYSICAL EXAM:  VITAL SIGNS: ED Triage Vitals  Enc Vitals Group     BP 08/15/18 1211 (!) 157/100     Pulse Rate 08/15/18 1211 99     Resp 08/15/18 1211 20     Temp 08/15/18 1211 98.5 F (36.9 C)     Temp Source 08/15/18 1211 Oral     SpO2 08/15/18 1211 98 %     Weight 08/15/18 1212 (!) 324 lb 1.2 oz (147 kg)     Height 08/15/18 1212 5\' 5"  (1.651 m)     Head Circumference --      Peak Flow --      Pain Score 08/15/18 1212 10     Pain Loc --      Pain Edu? --      Excl. in [place]? --     Vital signs reviewed, nursing assessments reviewed.   Constitutional:   Alert and oriented. Non-toxic appearance.  Morbidly obese Eyes:   Conjunctivae are normal. EOMI. PERRL. ENT      Head:   Normocephalic and atraumatic.      Nose:   No congestion/rhinnorhea.       Mouth/Throat:   MMM, no pharyngeal erythema. No peritonsillar mass.  Mouth soft.  No tongue elevation or intraoral edema      Neck:   No meningismus. Full ROM.  Tenderness of the left neck with intact surgical incision that is not inflamed or draining.  No pulsatile mass, no fluctuant fluid collection palpated.  Thyroid appears diffusely enlarged.  No focal tenderness along the cervical spine. Hematological/Lymphatic/Immunilogical:   No cervical lymphadenopathy. Cardiovascular:   RRR. Symmetric bilateral radial and DP pulses.  No murmurs. Cap refill less than 2 seconds. Respiratory:   Normal respiratory effort without tachypnea/retractions. Breath sounds are clear and equal bilaterally. No  wheezes/rales/rhonchi. Gastrointestinal:   Soft and nontender. Non distended. There is no CVA tenderness.  No rebound, rigidity, or guarding.  Musculoskeletal:   Normal range of motion in all extremities. No joint effusions.  No lower extremity tenderness.  No edema.  Symmetric calf circumference.  No calf tenderness or palpable cord. Neurologic:   Normal speech and language.  Motor grossly intact. No acute focal neurologic deficits are appreciated.  Skin:    Skin is warm, dry and intact. No rash noted.  No petechiae, purpura, or bullae.  ____________________________________________    LABS (pertinent positives/negatives) (all labs ordered are listed, but only abnormal results are displayed) Labs Reviewed  BASIC METABOLIC PANEL - Abnormal; Notable for the following components:      Result Value   Chloride 96 (*)    Glucose, Bld 254 (*)    All other components within normal limits  CBC  TROPONIN I  TSH  T4, FREE   ____________________________________________   EKG  Interpreted by me  Date: 08/15/2018  Rate: 88  Rhythm: normal sinus rhythm  QRS Axis: normal  Intervals: normal  ST/T Wave abnormalities: normal  Conduction Disutrbances: none  Narrative Interpretation: unremarkable      ____________________________________________    RADIOLOGY  Dg Chest 2 View  Result Date: 08/15/2018 CLINICAL DATA:  Increasing difficulty breathing, sore throat and painful swallowing since cervical spine surgery last Wednesday EXAM: CHEST - 2 VIEW COMPARISON:  07/18/2018 FINDINGS: Normal heart size, mediastinal contours, and pulmonary vascularity. Mild RIGHT basilar atelectasis. Lungs otherwise clear. No infiltrate, pleural effusion or pneumothorax. Bones unremarkable. IMPRESSION: Mild RIGHT basilar atelectasis. Electronically Signed   By: Lavonia Dana M.D.   On: 08/15/2018 12:37   Ct Soft Tissue Neck W Contrast  Result Date: 08/15/2018 CLINICAL DATA:  52 year old female status post  cervical spine surgery on 08/09/2018. Subsequent sore throat, painful swallowing, difficulty breathing and chest pressure. EXAM: CT NECK WITH CONTRAST TECHNIQUE: Multidetector CT imaging of the neck was performed using the standard protocol following the bolus administration of intravenous contrast. CONTRAST:  153mL OMNIPAQUE IOHEXOL 350 MG/ML SOLN in conjunction with contrast enhanced imaging of the chest reported separately. COMPARISON:  Intraoperative images 08/09/18. Postoperative cervical radiographs 08/10/2018. CTA head and neck 07/18/2018. Chest CTA today reported separately, and 03/29/2018. FINDINGS: Quantum mottle artifact due to large body habitus. Pharynx and larynx: The glottis is closed. Laryngeal and pharyngeal soft tissue contours appear similar to the August CTA. Negative parapharyngeal spaces. There is retropharyngeal or prevertebral soft tissue thickening or fluid measuring up to 10 millimeters (series 7, image 65). No rim enhancing collection. Enhancement of small retropharyngeal vessels on series 3, image 48 favors edema over a discrete fluid collection. There is subsequent mild mass effect on the posterior pharyngeal wall. Salivary glands: Negative sublingual space. Negative submandibular and parotid glands. Thyroid: Negative; detail limited by artifact. Lymph nodes: No cervical lymphadenopathy. Bilateral lymph nodes are stable and within normal limits. Vascular: The major vascular structures in the neck and at the skull base appears stable, no large vessel occlusion identified. Limited intracranial: Negative. Visualized orbits: Negative. Mastoids and visualized paranasal sinuses: Stable and clear. Skeleton: C4-C5 ACDF hardware appears intact without evidence of loosening. Other cervical spine levels appears stable. No acute osseous abnormality identified. Upper chest: Chest CTA today is reported separately. IMPRESSION: 1. Status post cervical ACDF with mild prevertebral edema, or less likely  postoperative seroma, resulting in mild mass effect on the posterior pharynx. Prevertebral soft tissue thickening up to 10 millimeters. 2. C4-C5 ACDF hardware with no adverse features. 3. Anatomic detail limited in some areas due to the artifact from large body habitus. Electronically Signed   By: Genevie Ann M.D.   On: 08/15/2018 14:41   Ct Angio Chest Pe W And/or Wo Contrast  Result Date:  08/15/2018 CLINICAL DATA:  52 year old female status post cervical spine surgery on 08/09/2018. Subsequent sore throat, painful swallowing, difficulty breathing and chest pressure. EXAM: CT ANGIOGRAPHY CHEST WITH CONTRAST TECHNIQUE: Multidetector CT imaging of the chest was performed using the standard protocol during bolus administration of intravenous contrast. Multiplanar CT image reconstructions and MIPs were obtained to evaluate the vascular anatomy. CONTRAST:  135mL OMNIPAQUE IOHEXOL 350 MG/ML SOLN COMPARISON:  CT neck today reported separately. Chest CTA 03/29/2018. Intraoperative images 08/09/18. Postoperative cervical radiographs 08/10/2018. CTA head and neck 07/18/2018. FINDINGS: Cardiovascular: Suboptimal contrast bolus timing in the pulmonary arterial tree. Superimposed mild respiratory motion and quantum mottle artifact due to large body habitus. No central or hilar pulmonary embolus. No convincing pulmonary artery filling defect elsewhere. Stable cardiac size at the upper limits of normal. No pericardial effusion. Stable and negative visible aorta. Mediastinum/Nodes: Negative.  No lymphadenopathy. Lungs/Pleura: Mildly lower lung volumes compared to 03/29/2018. The major airways are patent. Chronic elevation of the right hemidiaphragm. Mild right lung base atelectasis. Mild superimposed dependent atelectasis in both lungs. No other abnormal pulmonary opacity.  No pleural effusion. Upper Abdomen: Negative visible liver, spleen, pancreas, adrenal glands, kidneys, and proximal bowel in the upper abdomen. Suggestion of  splenic flexure diverticulosis. Musculoskeletal: No acute osseous abnormality identified. Lower thoracic disc degeneration with vacuum disc. Review of the MIP images confirms the above findings. IMPRESSION: 1. Suboptimal exam due to contrast timing and large body habitus but no strong evidence of acute pulmonary embolus. 2. Atelectasis, no other acute pulmonary findings. Electronically Signed   By: Genevie Ann M.D.   On: 08/15/2018 14:47    ____________________________________________   PROCEDURES Procedures  ____________________________________________  DIFFERENTIAL DIAGNOSIS   Pulmonary embolism, bacterial tracheitis, soft tissue infection postoperatively, hardware complication, pneumonia, pneumothorax, routine healing, pleurisy  CLINICAL IMPRESSION / ASSESSMENT AND PLAN / ED COURSE  Pertinent labs & imaging results that were available during my care of the patient were reviewed by me and considered in my medical decision making (see chart for details).    Patient is not in distress, vital signs unremarkable except slightly elevated heart rate.  Presents with worrisome symptoms about 1 week postop from ACDF/neck surgery.  Will need CT scan of the chest and neck to evaluate for complication of surgery/anesthesia versus PE .  1 L IV fluid bolus and Toradol 15mg  IV for hydration and symptom relief.  Clinical Course as of Aug 15 1645  Tue Aug 15, 2018  1531 CT scan showed no acute issues.  No demonstrable PE.  No evidence of large seroma or infectious complication.  No significant mass-effect on structures.  Discussed with neurosurgery, no changes to care, would not advise steroids at this time, continue routine follow-up which should be in about 1 week.   [PS]  1608 Tolerating oral intake.  Stable for discharge home.   [PS]    Clinical Course User Index [PS] Carrie Mew, MD     ----------------------------------------- 4:46 PM on  08/15/2018 -----------------------------------------  Patient updated on results, questions answered.  Vital signs remained stable, normal at last check.  ____________________________________________   FINAL CLINICAL IMPRESSION(S) / ED DIAGNOSES    Final diagnoses:  Post-operative pain     ED Discharge Orders    None      Portions of this note were generated with dragon dictation software. Dictation errors may occur despite best attempts at proofreading.    Carrie Mew, MD 08/15/18 [phone]

## 2018-08-15 NOTE — ED Notes (Signed)
Pt passed PO challenge as she can drink liquids and eat crackers, however pt endorses difficulty swallowing the solid food (crackers). Joni Fears MD aware.

## 2018-08-21 ENCOUNTER — Encounter (INDEPENDENT_AMBULATORY_CARE_PROVIDER_SITE_OTHER): Payer: Self-pay | Admitting: Vascular Surgery

## 2018-10-14 IMAGING — US US RENAL
1 series · 14 of 25 positions shown · non-contrast
Comparison: CT abdomen pelvis 03/29/2017

CLINICAL DATA: Renal failure

EXAM:
RENAL / URINARY TRACT ULTRASOUND COMPLETE

[Series 1: us renal · 0.31mm/px · 14 of 43 slices shown]
[im 1/43]
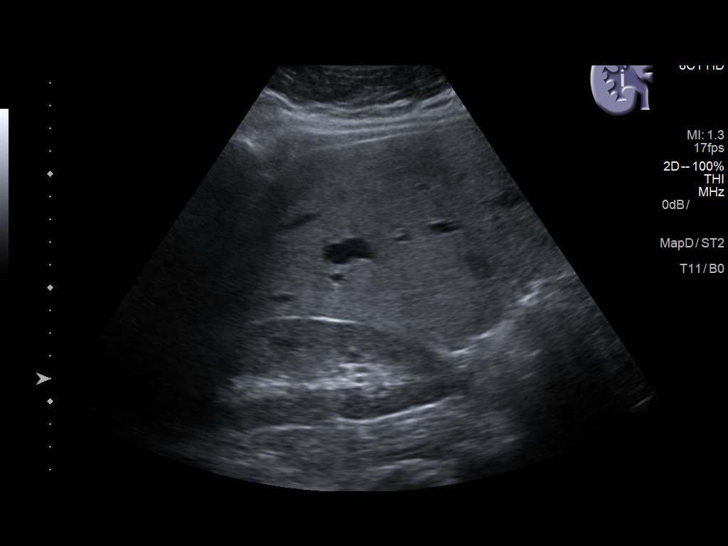
[im 4/43]
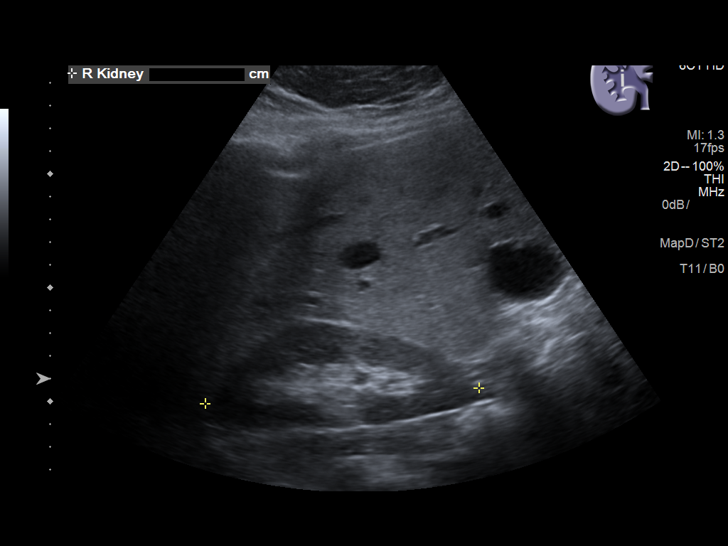
[im 8/43]
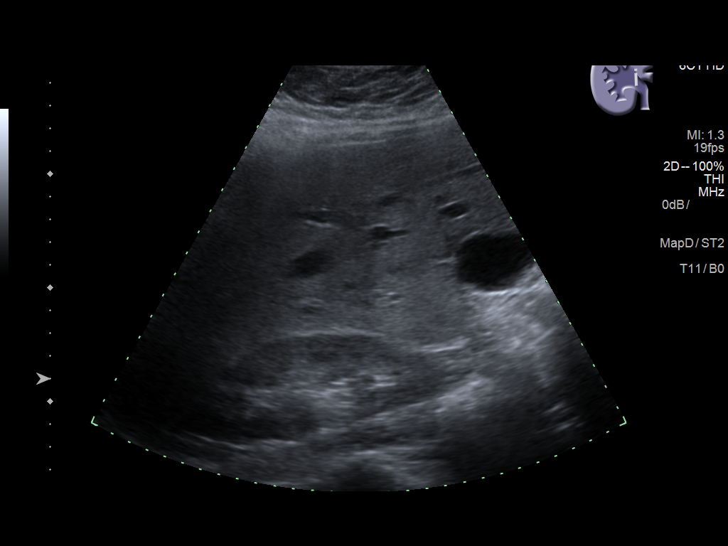
[im 11/43]
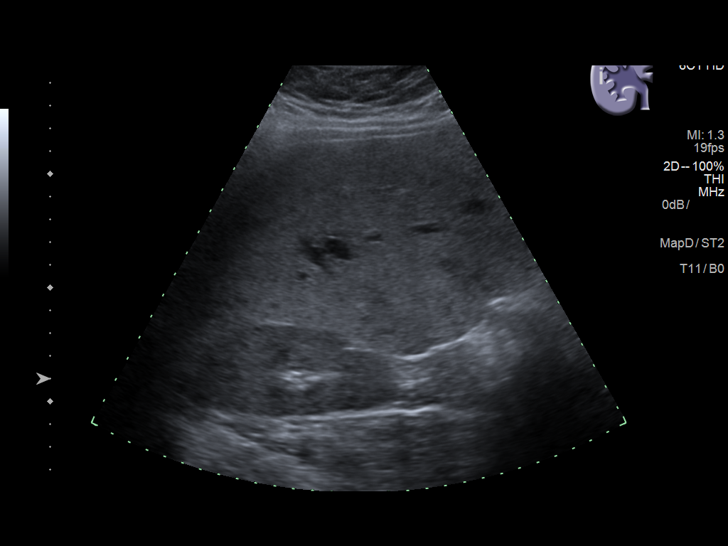
[im 15/43]
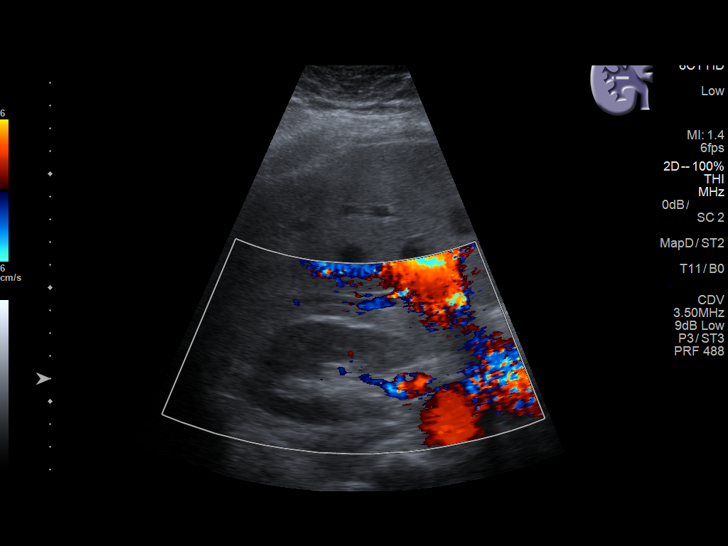
[im 16/43]
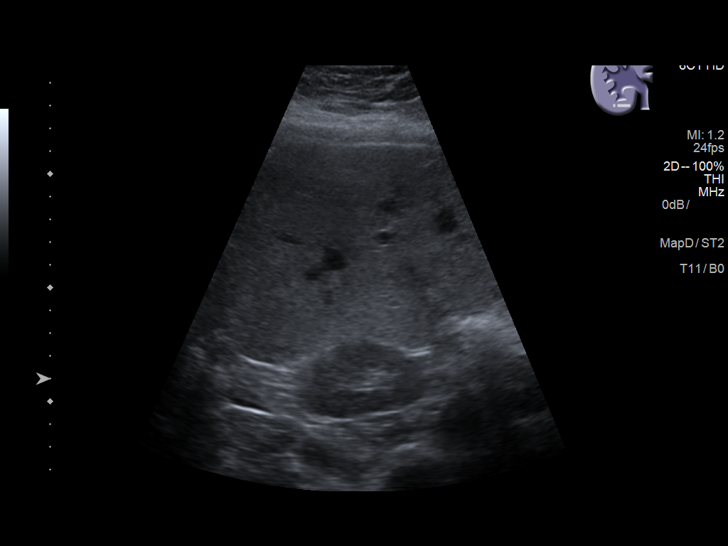
[im 20/43]
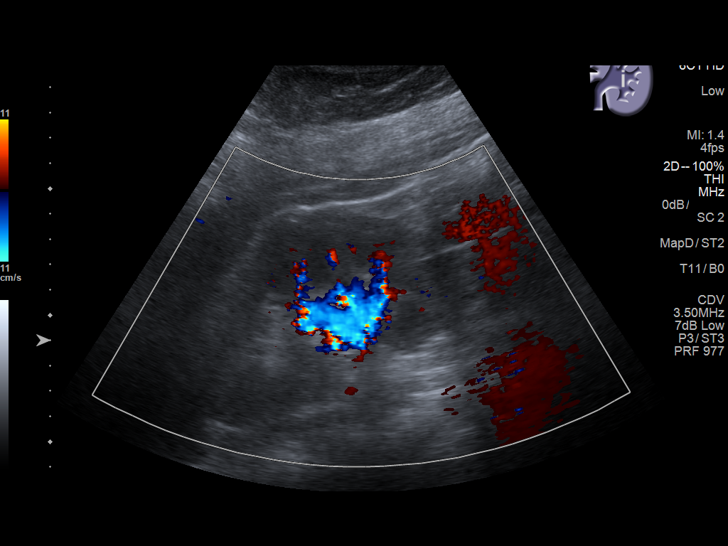
[im 23/43]
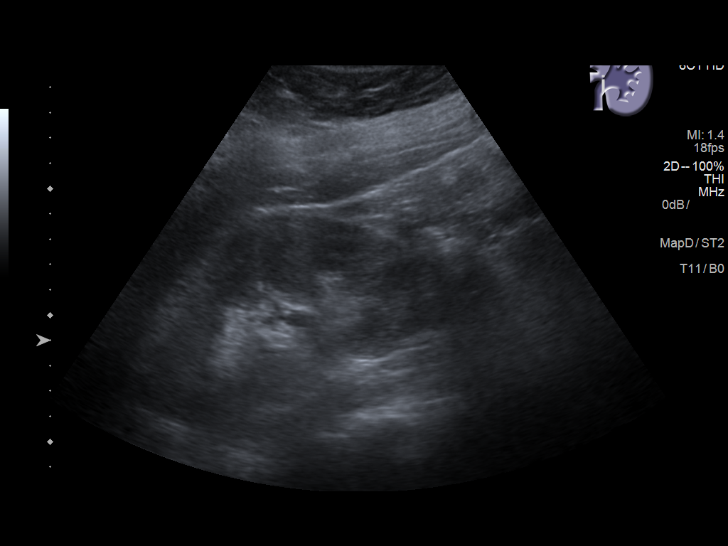
[im 27/43]
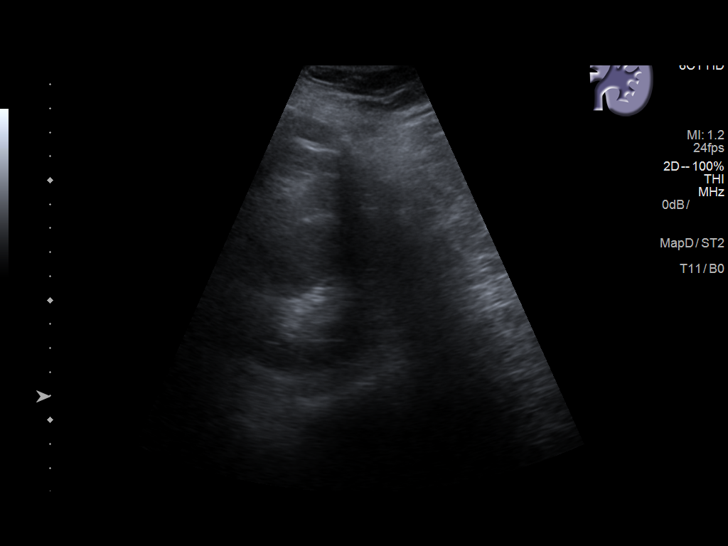
[im 29/43]
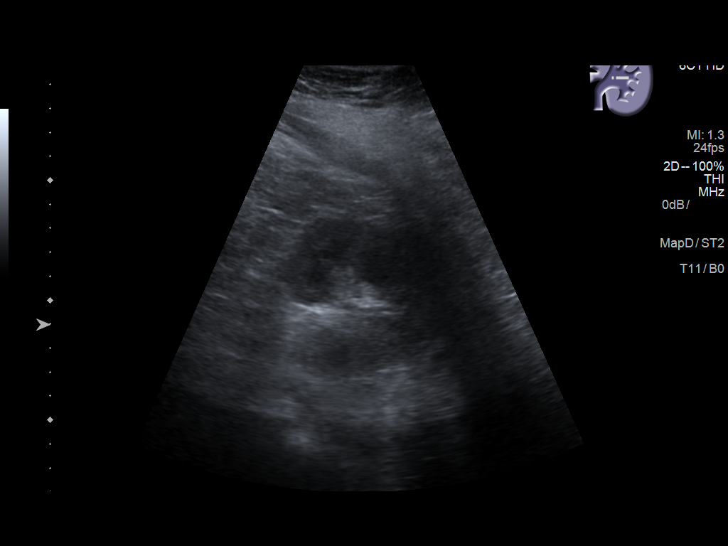
[im 32/43]
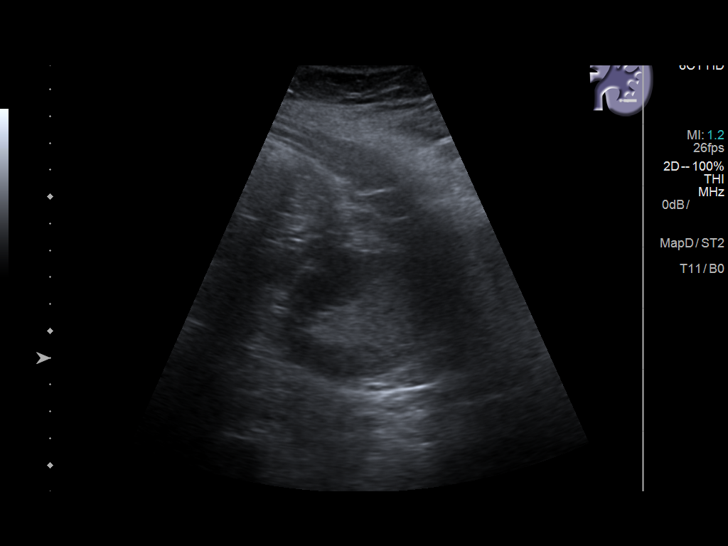
[im 36/43]
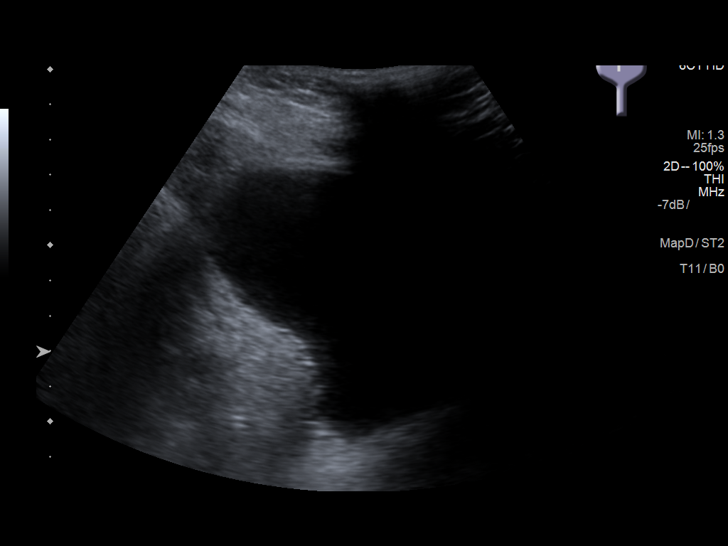
[im 39/43]
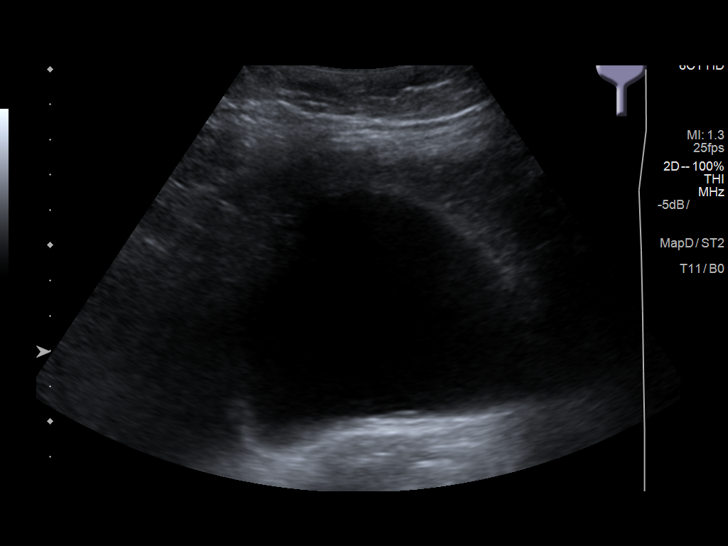
[im 43/43]
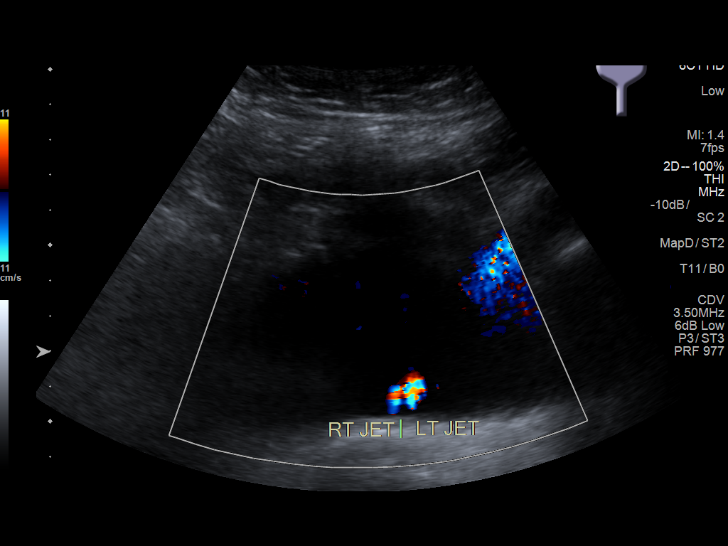

[14 of 25 positions shown; findings below may reference images not displayed]

FINDINGS: Right Kidney:

Length: 12.1 cm. Echogenicity within normal limits. No mass or
hydronephrosis visualized.

Left Kidney:

Length: 13.6 cm. Echogenicity within normal limits. No mass or
hydronephrosis visualized.

Bladder:

Appears normal for degree of bladder distention.
IMPRESSION: Negative renal ultrasound

## 2018-11-06 ENCOUNTER — Other Ambulatory Visit: Payer: Self-pay | Admitting: Neurosurgery

## 2018-11-06 DIAGNOSIS — G959 Disease of spinal cord, unspecified: Secondary | ICD-10-CM

## 2018-11-12 IMAGING — CR DG CHEST 2V
2 series · 2 of 2 positions shown · non-contrast
Comparison: 06/11/2017

CLINICAL DATA: Shortness of breath with foot and leg swelling.
Diabetes. Hypertension.

EXAM:
CHEST - 2 VIEW

[chest pa]
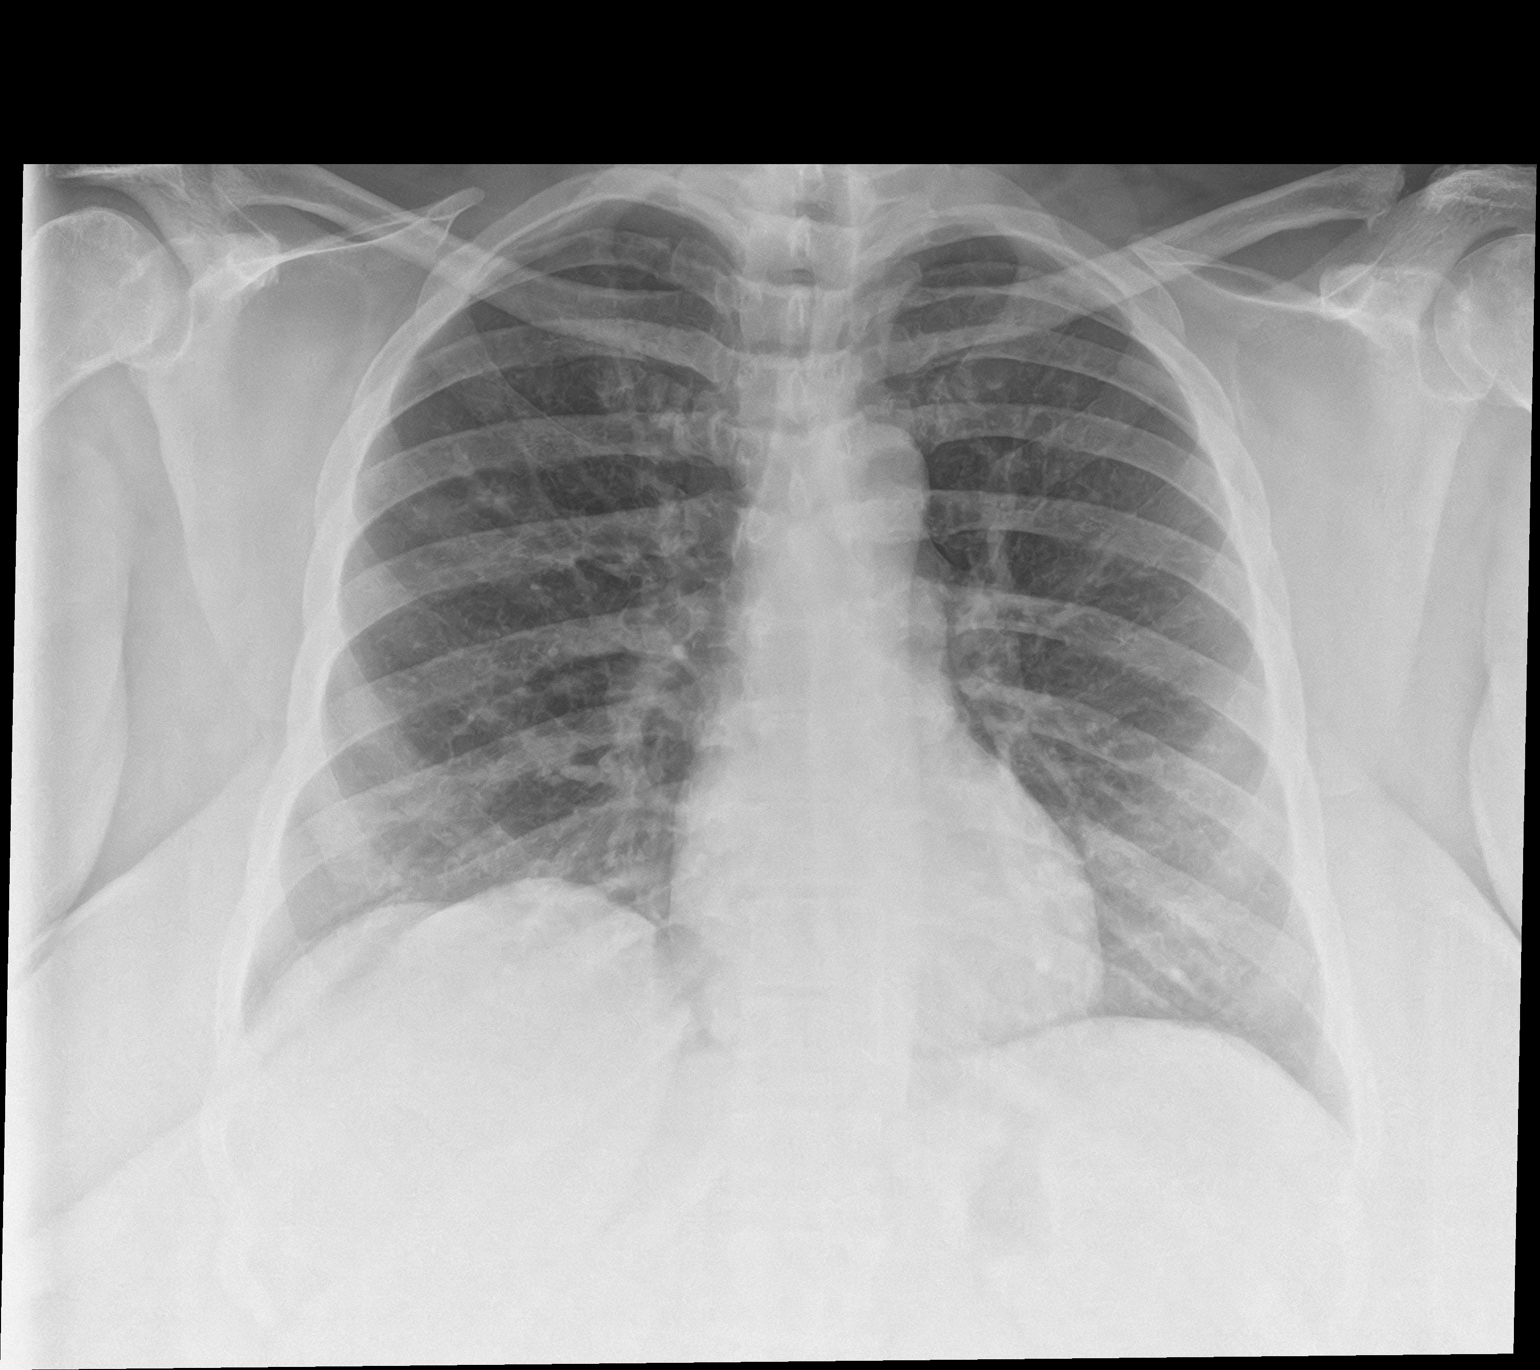

[chest lat]
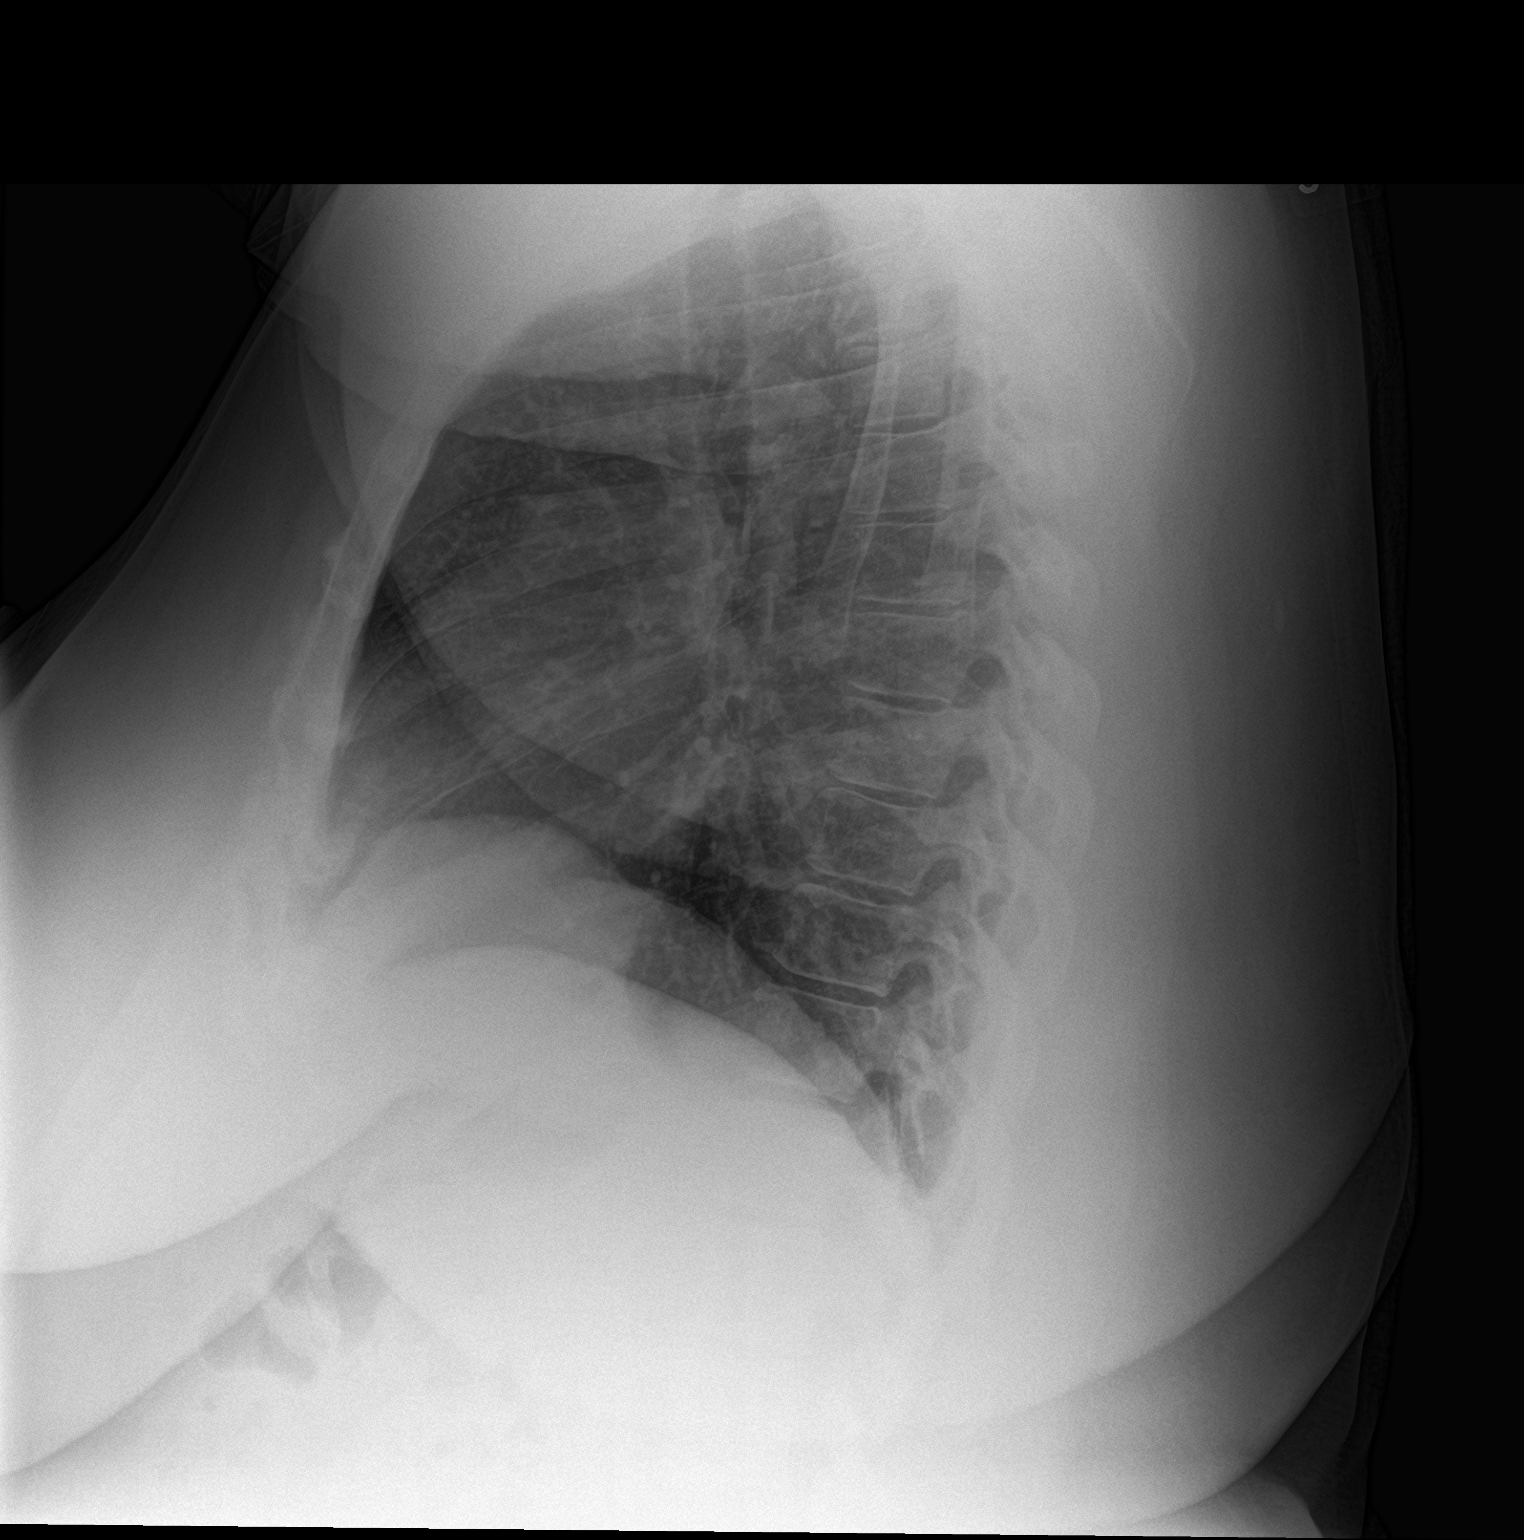

[2 of 2 positions shown; findings below may reference images not displayed]

FINDINGS: Midline trachea.  Normal heart size and mediastinal contours.

Sharp costophrenic angles.  No pneumothorax.  Clear lungs.

Lateral view degraded by patient arm position.
IMPRESSION: No active cardiopulmonary disease.

## 2018-11-23 ENCOUNTER — Ambulatory Visit
Admission: RE | Admit: 2018-11-23 | Discharge: 2018-11-23 | Disposition: A | Discharge status: Home / Self Care | Payer: Medicaid Other | Source: Ambulatory Visit | Attending: Neurosurgery | Admitting: Neurosurgery

## 2018-11-23 DIAGNOSIS — G959 Disease of spinal cord, unspecified: Secondary | ICD-10-CM | POA: Diagnosis present

## 2018-12-01 DIAGNOSIS — M7581 Other shoulder lesions, right shoulder: Secondary | ICD-10-CM | POA: Insufficient documentation

## 2018-12-17 ENCOUNTER — Other Ambulatory Visit: Payer: Self-pay

## 2018-12-17 ENCOUNTER — Emergency Department: Payer: Medicaid Other

## 2018-12-17 ENCOUNTER — Encounter: Payer: Self-pay | Admitting: Emergency Medicine

## 2018-12-17 ENCOUNTER — Emergency Department
Admission: EM | Admit: 2018-12-17 | Discharge: 2018-12-17 | Disposition: A | Discharge status: Home / Self Care | Payer: Medicaid Other | Attending: Emergency Medicine | Admitting: Emergency Medicine

## 2018-12-17 DIAGNOSIS — S4991XA Unspecified injury of right shoulder and upper arm, initial encounter: Secondary | ICD-10-CM | POA: Diagnosis not present

## 2018-12-17 DIAGNOSIS — Y92410 Unspecified street and highway as the place of occurrence of the external cause: Secondary | ICD-10-CM | POA: Diagnosis not present

## 2018-12-17 DIAGNOSIS — S199XXA Unspecified injury of neck, initial encounter: Secondary | ICD-10-CM | POA: Diagnosis not present

## 2018-12-17 DIAGNOSIS — Y998 Other external cause status: Secondary | ICD-10-CM | POA: Diagnosis not present

## 2018-12-17 DIAGNOSIS — Z87891 Personal history of nicotine dependence: Secondary | ICD-10-CM | POA: Insufficient documentation

## 2018-12-17 DIAGNOSIS — S0990XA Unspecified injury of head, initial encounter: Secondary | ICD-10-CM | POA: Diagnosis present

## 2018-12-17 DIAGNOSIS — Y93I9 Activity, other involving external motion: Secondary | ICD-10-CM | POA: Insufficient documentation

## 2018-12-17 MED ORDER — ONDANSETRON HCL 4 MG PO TABS: 4.0000 mg | ORAL_TABLET | Freq: Three times a day (TID) | ORAL | 1 refills | Status: AC | PRN

## 2018-12-17 MED ORDER — ACETAMINOPHEN 500 MG PO TABS
1000.0000 mg | ORAL_TABLET | Freq: Once | ORAL | Status: AC
  Administered 2018-12-17: 1000 mg via ORAL
  Filled 2018-12-17: qty 2

## 2018-12-17 MED ORDER — METHOCARBAMOL 500 MG PO TABS: 500.0000 mg | ORAL_TABLET | Freq: Three times a day (TID) | ORAL | 0 refills | Status: AC | PRN
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
## 2018-12-28 ENCOUNTER — Inpatient Hospital Stay: Admission: RE | Admit: 2018-12-28 | Payer: Medicaid Other | Source: Ambulatory Visit

## 2018-12-29 ENCOUNTER — Encounter
Admission: RE | Admit: 2018-12-29 | Discharge: 2018-12-29 | Disposition: A | Discharge status: Home / Self Care | Payer: Medicaid Other | Source: Ambulatory Visit | Attending: Surgery | Admitting: Surgery

## 2018-12-29 ENCOUNTER — Other Ambulatory Visit: Payer: Self-pay

## 2018-12-29 DIAGNOSIS — Z01812 Encounter for preprocedural laboratory examination: Secondary | ICD-10-CM | POA: Insufficient documentation

## 2018-12-29 HISTORY — DX: Anemia, unspecified: D64.9

## 2018-12-29 LAB — SURGICAL PCR SCREEN
MRSA, PCR: NEGATIVE
STAPHYLOCOCCUS AUREUS: NEGATIVE

## 2018-12-29 LAB — CBC WITH DIFFERENTIAL/PLATELET
Abs Immature Granulocytes: 0.01 10*3/uL (ref 0.00–0.07)
BASOS ABS: 0 10*3/uL (ref 0.0–0.1)
Basophils Relative: 0 %
Eosinophils Absolute: 0.1 10*3/uL (ref 0.0–0.5)
Eosinophils Relative: 1 %
HCT: 44.9 % (ref 36.0–46.0)
Hemoglobin: 13.8 g/dL (ref 12.0–15.0)
Immature Granulocytes: 0 %
Lymphocytes Relative: 41 %
Lymphs Abs: 2.1 10*3/uL (ref 0.7–4.0)
MCH: 29.6 pg (ref 26.0–34.0)
MCHC: 30.7 g/dL (ref 30.0–36.0)
MCV: 96.4 fL (ref 80.0–100.0)
Monocytes Absolute: 0.4 10*3/uL (ref 0.1–1.0)
Monocytes Relative: 8 %
NEUTROS PCT: 50 %
Neutro Abs: 2.5 10*3/uL (ref 1.7–7.7)
Platelets: 251 10*3/uL (ref 150–400)
RBC: 4.66 MIL/uL (ref 3.87–5.11)
RDW: 13.2 % (ref 11.5–15.5)
WBC: 5 10*3/uL (ref 4.0–10.5)
nRBC: 0 % (ref 0.0–0.2)

## 2018-12-29 LAB — BASIC METABOLIC PANEL
Anion gap: 8 (ref 5–15)
BUN: 15 mg/dL (ref 6–20)
CO2: 28 mmol/L (ref 22–32)
Calcium: 9.2 mg/dL (ref 8.9–10.3)
Chloride: 103 mmol/L (ref 98–111)
Creatinine, Ser: 0.73 mg/dL (ref 0.44–1.00)
GFR calc Af Amer: >60 mL/min (ref >60)
GFR calc non Af Amer: >60 mL/min (ref >60)
Glucose, Bld: 136 mg/dL — ABNORMAL HIGH (ref 70–99)
Potassium: 4 mmol/L (ref 3.5–5.1)
Sodium: 139 mmol/L (ref 135–145)

## 2018-12-29 NOTE — Patient Instructions (Signed)
Your procedure is scheduled on: Thursday, January 04, 2019  Report to [facility]    DO NOT STOP ON THE FIRST FLOOR TO REGISTER  To find out your arrival time please call [phone] between 1PM - 3PM on Wednesday, February 12th  Remember: Instructions that are not followed completely may result in serious medical risk,  up to and including death, or upon the discretion of your surgeon and anesthesiologist your  surgery may need to be rescheduled.     _X__ 1. Do not eat food after midnight the night before your procedure.                 No gum chewing or hard candies.                    ABSOLUTELY NOTHING SOLID IN YOUR MOUTH AFTER MIDNIGHT                  You may drink clear liquids up to 2 hours before you are scheduled to arrive for your surgery-                   DO not drink clear liquids within 2 hours of the start of your surgery.                  Clear Liquids include:  water, apple juice without pulp, clear carbohydrate                 drink such as Clearfast of Gatorade, Black Coffee or Tea (Do not add                 anything to coffee or tea).  __X__2.  On the morning of surgery brush your teeth with toothpaste and water,                 You may rinse your mouth with mouthwash if you wish.                     Do not swallow any toothpaste of mouthwash.     _X__ 3.  No Alcohol for 24 hours before or after surgery.   _X__ 4.  Do Not Smoke or use e-cigarettes For 24 Hours Prior to Your Surgery.                 Do not use any chewable tobacco products for at least 6 hours prior to                 surgery.  ____  5.  Bring all medications with you on the day of surgery if instructed.   ____  6.  Notify your doctor if there is any change in your medical condition      (cold, fever, infections).     Do not wear jewelry, make-up, hairpins, clips or nail polish. Do not wear lotions, powders, or perfumes. You may NOT wear  deodorant. Do not shave 48 hours prior to surgery. Men may shave face and neck. Do not bring valuables to the hospital.    [facility] is not responsible for any belongings or valuables.  Contacts, dentures or bridgework may not be worn into surgery. Leave your suitcase in the car. After surgery it may be brought to your room. For patients admitted to the hospital, discharge time is determined by your treatment team.   Patients discharged the day of surgery will  not be allowed to drive home.   Please read over the following fact sheets that you were given:   PREPARING FOR SURGERY   __X__ Take these medicines the morning of surgery with A SIP OF WATER:    1. ALBUTEROL, BRING WITH YOU TO [place], IF YOU WANT  3.   4.  5.  6.  ____ Fleet Enema (as directed)   _X___ Use CHG Soap as directed   _X___ Use inhalers on the day of surgery            BRING IT WITH YOU TO [place]  ____ Stop metformin 2 days prior to surgery    _X___ Take 1/2 of usual insulin dose the night before surgery. No insulin the morning          of surgery. TAKE 50 UNITS OF LANTUS THE NIGHT BEFORE             YOU MAY TAKE ALL OF YOUR HUMALOG ON THE DAY BEFORE SURGERY                 DO NOT TAKE JARDIANCE OR ACTOS ON THE DAY OF SURGERY BUT                    DO TAKE EVERY DAY UP UNTIL THEN  __X__ Stop ALL ASPIRIN PRODUCTS NOW  _X__ Stop Anti-inflammatories NOW             THIS INCLUDES ALEVE / ADVIL / IBUPROFEN / MOTRIN                 YOU MAY TAKE TYLENOL AT ANY TIME   ____ Stop supplements until after surgery.    ____ Bring C-Pap to the hospital.   OKAY TO TAKE BYDUREON ON Sunday AND VITAMIN D ON Monday  CONTINUE TAKING ALL REGULAR NIGHT TIME MEDICINES AS USUAL.  HAVE A STOOL SOFTENER AVAILABLE AT HOME.  WEAR A LARGE LOOSE SHIRT TO [place].

## 2019-01-01 ENCOUNTER — Other Ambulatory Visit: Payer: Medicaid Other

## 2019-01-01 ENCOUNTER — Encounter: Payer: Self-pay | Admitting: Certified Nurse Midwife

## 2019-01-03 MED ORDER — DEXTROSE 5 % IV SOLN
3.0000 g | Freq: Once | INTRAVENOUS | Status: AC
  Administered 2019-01-04: 3 g via INTRAVENOUS
  Filled 2019-01-03: qty 3

## 2019-01-04 ENCOUNTER — Ambulatory Visit: Payer: Medicaid Other | Admitting: Certified Registered Nurse Anesthetist

## 2019-01-04 ENCOUNTER — Encounter
Admission: RE | Disposition: A | Discharge status: Home / Self Care | Payer: Self-pay | Source: Ambulatory Visit | Attending: Surgery

## 2019-01-04 ENCOUNTER — Encounter: Payer: Self-pay | Admitting: *Deleted

## 2019-01-04 ENCOUNTER — Other Ambulatory Visit: Payer: Self-pay

## 2019-01-04 ENCOUNTER — Ambulatory Visit
Admission: RE | Admit: 2019-01-04 | Discharge: 2019-01-04 | Disposition: A | Discharge status: Home / Self Care | Payer: Medicaid Other | Source: Ambulatory Visit | Attending: Surgery | Admitting: Surgery

## 2019-01-04 DIAGNOSIS — E119 Type 2 diabetes mellitus without complications: Secondary | ICD-10-CM | POA: Diagnosis not present

## 2019-01-04 DIAGNOSIS — Z7982 Long term (current) use of aspirin: Secondary | ICD-10-CM | POA: Insufficient documentation

## 2019-01-04 DIAGNOSIS — Z79899 Other long term (current) drug therapy: Secondary | ICD-10-CM | POA: Diagnosis not present

## 2019-01-04 DIAGNOSIS — Z8673 Personal history of transient ischemic attack (TIA), and cerebral infarction without residual deficits: Secondary | ICD-10-CM | POA: Diagnosis not present

## 2019-01-04 DIAGNOSIS — Z794 Long term (current) use of insulin: Secondary | ICD-10-CM | POA: Diagnosis not present

## 2019-01-04 DIAGNOSIS — M7521 Bicipital tendinitis, right shoulder: Secondary | ICD-10-CM | POA: Insufficient documentation

## 2019-01-04 DIAGNOSIS — G4730 Sleep apnea, unspecified: Secondary | ICD-10-CM | POA: Diagnosis not present

## 2019-01-04 DIAGNOSIS — E559 Vitamin D deficiency, unspecified: Secondary | ICD-10-CM | POA: Diagnosis not present

## 2019-01-04 DIAGNOSIS — Z87891 Personal history of nicotine dependence: Secondary | ICD-10-CM | POA: Insufficient documentation

## 2019-01-04 DIAGNOSIS — M7541 Impingement syndrome of right shoulder: Secondary | ICD-10-CM | POA: Insufficient documentation

## 2019-01-04 DIAGNOSIS — M75111 Incomplete rotator cuff tear or rupture of right shoulder, not specified as traumatic: Secondary | ICD-10-CM | POA: Diagnosis not present

## 2019-01-04 DIAGNOSIS — I10 Essential (primary) hypertension: Secondary | ICD-10-CM | POA: Diagnosis not present

## 2019-01-04 DIAGNOSIS — M7581 Other shoulder lesions, right shoulder: Secondary | ICD-10-CM | POA: Diagnosis not present

## 2019-01-04 DIAGNOSIS — M19011 Primary osteoarthritis, right shoulder: Secondary | ICD-10-CM | POA: Diagnosis not present

## 2019-01-04 DIAGNOSIS — Z8589 Personal history of malignant neoplasm of other organs and systems: Secondary | ICD-10-CM | POA: Insufficient documentation

## 2019-01-04 DIAGNOSIS — Z96651 Presence of right artificial knee joint: Secondary | ICD-10-CM | POA: Diagnosis not present

## 2019-01-04 DIAGNOSIS — Z6841 Body Mass Index (BMI) 40.0 and over, adult: Secondary | ICD-10-CM | POA: Insufficient documentation

## 2019-01-04 DIAGNOSIS — J45909 Unspecified asthma, uncomplicated: Secondary | ICD-10-CM | POA: Diagnosis not present

## 2019-01-04 HISTORY — PX: SHOULDER ARTHROSCOPY WITH SUBACROMIAL DECOMPRESSION AND BICEP TENDON REPAIR: SHX5689

## 2019-01-04 HISTORY — PX: SHOULDER ARTHROSCOPY WITH ROTATOR CUFF REPAIR: SHX5685

## 2019-01-04 LAB — GLUCOSE, CAPILLARY
GLUCOSE-CAPILLARY: 120 mg/dL — AB (ref 70–99)
Glucose-Capillary: 182 mg/dL — ABNORMAL HIGH (ref 70–99)

## 2019-01-04 SURGERY — SHOULDER ARTHROSCOPY WITH SUBACROMIAL DECOMPRESSION AND BICEP TENDON REPAIR
Anesthesia: Choice | Site: Shoulder | Laterality: Right

## 2019-01-04 MED ORDER — DEXAMETHASONE SODIUM PHOSPHATE 4 MG/ML IJ SOLN
INTRAMUSCULAR | Status: DC | PRN
  Administered 2019-01-04: 6 mg via INTRAVENOUS

## 2019-01-04 MED ORDER — ONDANSETRON HCL 4 MG/2ML IJ SOLN
4.0000 mg | Freq: Once | INTRAMUSCULAR | Status: AC | PRN
  Administered 2019-01-04: 4 mg via INTRAVENOUS

## 2019-01-04 MED ORDER — ROCURONIUM BROMIDE 50 MG/5ML IV SOLN
INTRAVENOUS | Status: AC
  Filled 2019-01-04: qty 2

## 2019-01-04 MED ORDER — OXYCODONE HCL 5 MG PO TABS: 5.0000 mg | ORAL_TABLET | ORAL | 0 refills | Status: DC | PRN

## 2019-01-04 MED ORDER — SODIUM CHLORIDE 0.9 % IV SOLN
INTRAVENOUS | Status: DC
  Administered 2019-01-04: 10:00:00 via INTRAVENOUS

## 2019-01-04 MED ORDER — PROPOFOL 10 MG/ML IV BOLUS
INTRAVENOUS | Status: DC | PRN
  Administered 2019-01-04: 200 mg via INTRAVENOUS

## 2019-01-04 MED ORDER — METOCLOPRAMIDE HCL 5 MG/ML IJ SOLN: 5.0000 mg | Freq: Three times a day (TID) | INTRAMUSCULAR | Status: DC | PRN

## 2019-01-04 MED ORDER — BUPIVACAINE LIPOSOME 1.3 % IJ SUSP
INTRAMUSCULAR | Status: DC | PRN
  Administered 2019-01-04: 20 mL

## 2019-01-04 MED ORDER — ACETAMINOPHEN 10 MG/ML IV SOLN
INTRAVENOUS | Status: DC | PRN
  Administered 2019-01-04: 1000 mg via INTRAVENOUS

## 2019-01-04 MED ORDER — FAMOTIDINE 20 MG PO TABS
20.0000 mg | ORAL_TABLET | Freq: Once | ORAL | Status: AC
  Administered 2019-01-04: 20 mg via ORAL

## 2019-01-04 MED ORDER — LACTATED RINGERS IV SOLN
INTRAVENOUS | Status: DC | PRN
  Administered 2019-01-04: 1 mL

## 2019-01-04 MED ORDER — BUPIVACAINE-EPINEPHRINE 0.5% -1:200000 IJ SOLN
INTRAMUSCULAR | Status: DC | PRN
  Administered 2019-01-04: 30 mL

## 2019-01-04 MED ORDER — BUPIVACAINE LIPOSOME 1.3 % IJ SUSP
INTRAMUSCULAR | Status: AC
  Filled 2019-01-04: qty 20

## 2019-01-04 MED ORDER — FENTANYL CITRATE (PF) 100 MCG/2ML IJ SOLN
INTRAMUSCULAR | Status: DC | PRN
  Administered 2019-01-04 (×2): 50 ug via INTRAVENOUS
  Administered 2019-01-04: 75 ug via INTRAVENOUS
  Administered 2019-01-04: 50 ug via INTRAVENOUS
  Administered 2019-01-04: 100 ug via INTRAVENOUS
  Administered 2019-01-04: 75 ug via INTRAVENOUS
  Administered 2019-01-04: 100 ug via INTRAVENOUS

## 2019-01-04 MED ORDER — LIDOCAINE HCL (CARDIAC) PF 100 MG/5ML IV SOSY
PREFILLED_SYRINGE | INTRAVENOUS | Status: DC | PRN
  Administered 2019-01-04 (×2): 100 mg via INTRAVENOUS

## 2019-01-04 MED ORDER — METOCLOPRAMIDE HCL 10 MG PO TABS: 5.0000 mg | ORAL_TABLET | Freq: Three times a day (TID) | ORAL | Status: DC | PRN

## 2019-01-04 MED ORDER — FENTANYL CITRATE (PF) 250 MCG/5ML IJ SOLN
INTRAMUSCULAR | Status: AC
  Filled 2019-01-04: qty 5

## 2019-01-04 MED ORDER — FAMOTIDINE 20 MG PO TABS
ORAL_TABLET | ORAL | Status: AC
  Administered 2019-01-04: 20 mg via ORAL
  Filled 2019-01-04: qty 1

## 2019-01-04 MED ORDER — OXYCODONE HCL 5 MG PO TABS
ORAL_TABLET | ORAL | Status: AC
  Filled 2019-01-04: qty 1

## 2019-01-04 MED ORDER — ROCURONIUM BROMIDE 100 MG/10ML IV SOLN
INTRAVENOUS | Status: DC | PRN
  Administered 2019-01-04: 80 mg via INTRAVENOUS

## 2019-01-04 MED ORDER — ACETAMINOPHEN 10 MG/ML IV SOLN
INTRAVENOUS | Status: AC
  Filled 2019-01-04: qty 100

## 2019-01-04 MED ORDER — MIDAZOLAM HCL 2 MG/2ML IJ SOLN
INTRAMUSCULAR | Status: DC | PRN
  Administered 2019-01-04: 2 mg via INTRAVENOUS

## 2019-01-04 MED ORDER — MIDAZOLAM HCL 2 MG/2ML IJ SOLN
INTRAMUSCULAR | Status: AC
  Filled 2019-01-04: qty 2

## 2019-01-04 MED ORDER — HYDROMORPHONE HCL 1 MG/ML IJ SOLN
0.2500 mg | INTRAMUSCULAR | Status: AC | PRN
  Administered 2019-01-04 (×8): 0.25 mg via INTRAVENOUS

## 2019-01-04 MED ORDER — LIDOCAINE HCL (PF) 2 % IJ SOLN
INTRAMUSCULAR | Status: AC
  Filled 2019-01-04: qty 20

## 2019-01-04 MED ORDER — EPINEPHRINE PF 1 MG/ML IJ SOLN
INTRAMUSCULAR | Status: AC
  Filled 2019-01-04: qty 2

## 2019-01-04 MED ORDER — BUPIVACAINE HCL (PF) 0.5 % IJ SOLN
INTRAMUSCULAR | Status: AC
  Filled 2019-01-04: qty 30

## 2019-01-04 MED ORDER — PROPOFOL 10 MG/ML IV BOLUS
INTRAVENOUS | Status: AC
  Filled 2019-01-04: qty 20

## 2019-01-04 MED ORDER — ONDANSETRON HCL 4 MG/2ML IJ SOLN: 4.0000 mg | Freq: Four times a day (QID) | INTRAMUSCULAR | Status: DC | PRN

## 2019-01-04 MED ORDER — HYDROMORPHONE HCL 1 MG/ML IJ SOLN
INTRAMUSCULAR | Status: AC
  Administered 2019-01-04: 0.25 mg via INTRAVENOUS
  Filled 2019-01-04: qty 1

## 2019-01-04 MED ORDER — ONDANSETRON HCL 4 MG/2ML IJ SOLN
INTRAMUSCULAR | Status: DC | PRN
  Administered 2019-01-04: 4 mg via INTRAVENOUS

## 2019-01-04 MED ORDER — OXYCODONE HCL 5 MG PO TABS
5.0000 mg | ORAL_TABLET | ORAL | Status: DC | PRN
  Administered 2019-01-04: 5 mg via ORAL

## 2019-01-04 MED ORDER — ONDANSETRON HCL 4 MG/2ML IJ SOLN
INTRAMUSCULAR | Status: AC
  Filled 2019-01-04: qty 2

## 2019-01-04 MED ORDER — POTASSIUM CHLORIDE IN NACL 20-0.9 MEQ/L-% IV SOLN: INTRAVENOUS | Status: DC

## 2019-01-04 MED ORDER — SUGAMMADEX SODIUM 200 MG/2ML IV SOLN
INTRAVENOUS | Status: DC | PRN
  Administered 2019-01-04: 200 mg via INTRAVENOUS

## 2019-01-04 MED ORDER — ONDANSETRON HCL 4 MG PO TABS: 4.0000 mg | ORAL_TABLET | Freq: Four times a day (QID) | ORAL | Status: DC | PRN

## 2019-01-04 MED ORDER — FENTANYL CITRATE (PF) 100 MCG/2ML IJ SOLN
25.0000 ug | INTRAMUSCULAR | Status: DC | PRN
  Administered 2019-01-04 (×4): 25 ug via INTRAVENOUS

## 2019-01-04 MED ORDER — FENTANYL CITRATE (PF) 100 MCG/2ML IJ SOLN
INTRAMUSCULAR | Status: AC
  Administered 2019-01-04: 25 ug via INTRAVENOUS
  Filled 2019-01-04: qty 2

## 2019-01-04 MED ORDER — LIDOCAINE HCL (PF) 4 % IJ SOLN
INTRAMUSCULAR | Status: DC | PRN
  Administered 2019-01-04: 4 mL via RESPIRATORY_TRACT

## 2019-01-04 MED ORDER — GLYCOPYRROLATE 0.2 MG/ML IJ SOLN
INTRAMUSCULAR | Status: AC
  Filled 2019-01-04: qty 1

## 2019-01-04 SURGICAL SUPPLY — 48 items
ANCHOR BONE REGENETEN (Anchor) ×3 IMPLANT
ANCHOR JUGGERKNOT WTAP NDL 2.9 (Anchor) ×6 IMPLANT
ANCHOR SUT QUATTRO KNTLS 4.5 (Anchor) ×6 IMPLANT
ANCHOR TENDON REGENETEN (Staple) ×3 IMPLANT
BIT DRILL JUGRKNT W/NDL BIT2.9 (DRILL) ×2 IMPLANT
BLADE FULL RADIUS 3.5 (BLADE) ×3 IMPLANT
BNDG COHESIVE 4X5 TAN STRL (GAUZE/BANDAGES/DRESSINGS) ×3 IMPLANT
BUR ACROMIONIZER 4.0 (BURR) ×3 IMPLANT
CANNULA SHAVER 8MMX76MM (CANNULA) ×3 IMPLANT
CHLORAPREP W/TINT 26ML (MISCELLANEOUS) ×3 IMPLANT
COVER MAYO STAND STRL (DRAPES) ×3 IMPLANT
COVER WAND RF STERILE (DRAPES) IMPLANT
DRAPE IMP U-DRAPE 54X76 (DRAPES) ×6 IMPLANT
DRILL JUGGERKNOT W/NDL BIT 2.9 (DRILL) ×3
ELECT REM PT RETURN 9FT ADLT (ELECTROSURGICAL) ×3
ELECTRODE REM PT RTRN 9FT ADLT (ELECTROSURGICAL) ×2 IMPLANT
GAUZE PETRO XEROFOAM 1X8 (MISCELLANEOUS) ×3 IMPLANT
GAUZE SPONGE 4X4 12PLY STRL (GAUZE/BANDAGES/DRESSINGS) ×3 IMPLANT
GLOVE BIO SURGEON STRL SZ7.5 (GLOVE) ×6 IMPLANT
GLOVE BIO SURGEON STRL SZ8 (GLOVE) ×6 IMPLANT
GLOVE BIOGEL PI IND STRL 8 (GLOVE) ×2 IMPLANT
GLOVE BIOGEL PI INDICATOR 8 (GLOVE) ×1
GLOVE INDICATOR 8.0 STRL GRN (GLOVE) ×3 IMPLANT
GOWN STRL REUS W/ TWL LRG LVL3 (GOWN DISPOSABLE) ×2 IMPLANT
GOWN STRL REUS W/ TWL XL LVL3 (GOWN DISPOSABLE) ×2 IMPLANT
GOWN STRL REUS W/TWL LRG LVL3 (GOWN DISPOSABLE) ×1
GOWN STRL REUS W/TWL XL LVL3 (GOWN DISPOSABLE) ×1
GRASPER SUT 15 45D LOW PRO (SUTURE) IMPLANT
IMPL REGENETEN MEDIUM (Shoulder) ×2 IMPLANT
IMPLANT REGENETEN MEDIUM (Shoulder) ×3 IMPLANT
IV LACTATED RINGER IRRG 3000ML (IV SOLUTION) ×2
IV LR IRRIG 3000ML ARTHROMATIC (IV SOLUTION) ×4 IMPLANT
MANIFOLD NEPTUNE II (INSTRUMENTS) ×3 IMPLANT
MASK FACE SPIDER DISP (MASK) ×3 IMPLANT
MAT ABSORB  FLUID 56X50 GRAY (MISCELLANEOUS) ×1
MAT ABSORB FLUID 56X50 GRAY (MISCELLANEOUS) ×2 IMPLANT
PACK ARTHROSCOPY SHOULDER (MISCELLANEOUS) ×3 IMPLANT
SLING ARM LRG DEEP (SOFTGOODS) IMPLANT
SLING ULTRA II LG (MISCELLANEOUS) ×3 IMPLANT
STAPLER SKIN PROX 35W (STAPLE) ×3 IMPLANT
STRAP SAFETY 5IN WIDE (MISCELLANEOUS) ×3 IMPLANT
SUT ETHIBOND 0 MO6 C/R (SUTURE) ×3 IMPLANT
SUT VIC AB 2-0 CT1 27 (SUTURE) ×2
SUT VIC AB 2-0 CT1 TAPERPNT 27 (SUTURE) ×4 IMPLANT
TAPE MICROFOAM 4IN (TAPE) ×3 IMPLANT
TUBING ARTHRO INFLOW-ONLY STRL (TUBING) ×3 IMPLANT
TUBING CONNECTING 10 (TUBING) ×3 IMPLANT
WAND WEREWOLF FLOW 90D (MISCELLANEOUS) ×3 IMPLANT

## 2019-01-04 NOTE — Anesthesia Procedure Notes (Signed)
Procedure Name: Intubation Date/Time: 01/04/2019 11:24 AM Performed by: Bernardo Heater, CRNA Pre-anesthesia Checklist: Patient identified, Emergency Drugs available, Suction available and Patient being monitored Patient Re-evaluated:Patient Re-evaluated prior to induction Oxygen Delivery Method: Circle system utilized Preoxygenation: Pre-oxygenation with 100% oxygen Induction Type: IV induction Laryngoscope Size: Mac and 3 Grade View: Grade I Tube type: Oral Tube size: 7.0 mm Number of attempts: 1 Airway Equipment and Method: LTA kit utilized Placement Confirmation: ETT inserted through vocal cords under direct vision,  positive ETCO2 and breath sounds checked- equal and bilateral Secured at: 23 cm Tube secured with: Tape Dental Injury: Teeth and Oropharynx as per pre-operative assessment

## 2019-01-04 NOTE — Transfer of Care (Signed)
Immediate Anesthesia Transfer of Care Note  Patient: Brenda Hicks  Procedure(s) Performed: SHOULDER ARTHROSCOPY WITH DEBRIDEMENT AND SUBACROMIAL DECOMPRESSION-RIGHT (Right ) SHOULDER ARTHROSCOPY WITH ROTATOR CUFF REPAIR (Right Shoulder)  Patient Location: PACU  Anesthesia Type:General  Level of Consciousness: awake, alert , oriented and patient cooperative  Airway & Oxygen Therapy: Patient Spontanous Breathing and Patient connected to nasal cannula oxygen  Post-op Assessment: Report given to RN and Post -op Vital signs reviewed and stable  Post vital signs: Reviewed and stable  Last Vitals:  Vitals Value Taken Time  BP 174/103 01/04/2019  1:10 PM  Temp    Pulse 93 01/04/2019  1:12 PM  Resp    SpO2 100 % 01/04/2019  1:12 PM  Vitals shown include unvalidated device data.  Last Pain:  Vitals:   01/04/19 0927  TempSrc: Tympanic  PainSc: 0-No pain         Complications: No apparent anesthesia complications

## 2019-01-04 NOTE — Anesthesia Post-op Follow-up Note (Signed)
Anesthesia QCDR form completed.        

## 2019-01-04 NOTE — Discharge Instructions (Addendum)
Orthopedic discharge instructions: Keep dressing dry and intact.  May shower after dressing changed on post-op day #4 (Monday).  Cover staples with Band-Aids after drying off. Apply ice frequently to shoulder. Take Aleve 2 tablets BID OR ibuprofen 800 mg TID with meals for 7-10 days, then as necessary. Take oxycodone as prescribed when needed.  May supplement with ES Tylenol if necessary. Keep shoulder immobilizer on at all times except may remove for bathing purposes. Follow-up in 10-14 days or as scheduled.   AMBULATORY SURGERY  DISCHARGE INSTRUCTIONS   1) The drugs that you were given will stay in your system until tomorrow so for the next 24 hours you should not:  A) Drive an automobile B) Make any legal decisions C) Drink any alcoholic beverage   2) You may resume regular meals tomorrow.  Today it is better to start with liquids and gradually work up to solid foods.  You may eat anything you prefer, but it is better to start with liquids, then soup and crackers, and gradually work up to solid foods.   3) Please notify your doctor immediately if you have any unusual bleeding, trouble breathing, redness and pain at the surgery site, drainage, fever, or pain not relieved by medication.    4) Additional Instructions:        Please contact your physician with any problems or Same Day Surgery at [phone], Monday through Friday 6 am to 4 pm, or [place] at [facility] number at [phone].

## 2019-01-04 NOTE — H&P (Signed)
Paper H&P to be scanned into permanent record. H&P reviewed and patient re-examined. No changes. 

## 2019-01-04 NOTE — Op Note (Signed)
01/04/2019  1:02 PM  Patient:   Brenda Hicks  Pre-Op Diagnosis:   Impingement/tendinopathy, right shoulder.  Post-Op Diagnosis:   Impingement/tendinopathy with near full-thickness bursal-sided rotator cuff tear early degenerative joint disease, mild labral fraying, and biceps tendinopathy, right shoulder.  Procedure:   Extensive arthroscopic debridement with biceps tenolysis, arthroscopic subacromial decompression, and mini-open rotator cuff repair using a [employer]hew Regeneten patch, right shoulder.  Anesthesia:   GET  Surgeon:   Pascal Lux, MD  Assistant:   Cameron Proud, PA-C  Findings:   As above.  The remainder of the rotator cuff was in excellent condition.  There were several areas of labral fraying anteriorly and superiorly without frank detachment from the glenoid.  The biceps demonstrated evidence of "lip sticking" without any partial or full-thickness tearing.  There was a central area of grade II chondromalacia involving the central portion of the glenoid.  The remainder of the articular surfaces of the glenoid and humerus all were in excellent condition.  Complications:   None  Fluids:   800 cc  Estimated blood loss:   10 cc  Tourniquet time:   None  Drains:   None  Closure:   Staples      Brief clinical note:   The patient is a 53 year old female with a long history of right shoulder pain. The patient's symptoms have progressed despite medications, activity modification, etc. The patient's history and examination are consistent with impingement/tendinopathy with a rotator cuff tear. These findings were confirmed by MRI scan. The patient presents at this time for definitive management of these shoulder symptoms.  Procedure:   The patient was brought into the operating room and lain in the supine position. The patient then underwent general endotracheal intubation and anesthesia before being repositioned in the beach chair position using the beach chair  positioner. The right shoulder and upper extremity were prepped with ChloraPrep solution before being draped sterilely. Preoperative antibiotics were administered. A timeout was performed to confirm the proper surgical site before the expected portal sites and incision site were injected with 0.5% Sensorcaine with epinephrine. A posterior portal was created and the glenohumeral joint thoroughly inspected with the findings as described above. An anterior portal was created using an outside-in technique. The labrum and rotator cuff were further probed, again confirming the above-noted findings. The areas of labral fraying and synovitis were debrided back to stable margins using the full-radius resector, as were focal areas of loose articular cartilage near the central portion of the glenoid. The ArthroCare wand was inserted and used to release the biceps tendon from its labral anchor. It also was used to obtain hemostasis as well as to "anneal" the labrum superiorly and anteriorly. The instruments were removed from the joint after suctioning the excess fluid.  The camera was repositioned through the posterior portal into the subacromial space. A separate lateral portal was created using an outside-in technique. The 3.5 mm full-radius resector was introduced and used to perform a subtotal bursectomy. The ArthroCare wand was then inserted and used to remove the periosteal tissue off the undersurface of the anterior third of the acromion as well as to recess the coracoacromial ligament from its attachment along the anterior and lateral margins of the acromion. The 4.0 mm acromionizing bur was introduced and used to complete the decompression by removing the undersurface of the anterior third of the acromion. The full radius resector was reintroduced to remove any residual bony debris before the ArthroCare wand was reintroduced to  obtain hemostasis. The instruments were then removed from the subacromial space after  suctioning the excess fluid.  An approximately 4-5 cm incision was made over the anterolateral aspect of the shoulder beginning at the anterolateral corner of the acromion and extending distally in line with the bicipital groove. This incision was carried down through the subcutaneous tissues to expose the deltoid fascia. The raphae between the anterior and middle thirds was identified and this plane developed to provide access into the subacromial space. Additional bursal tissues were debrided sharply using Metzenbaum scissors. The rotator cuff tear was carefully inspected.  There was noted to be a near full-thickness bursal surface tear involving the mid-insertional fibers of the supraspinatus and measuring approximately 1.5 x 1 cm. The margins were debrided sharply with a #15 blade and the exposed greater tuberosity roughened with a rongeur. The tear was repaired using two Biomet 2.9 mm JuggerKnot anchors. These sutures were then brought back laterally and secured using two [place] QuatroLink anchors to create a two-layer closure.  In addition, because of the moderate fraying noted on the bursal surface of the supraspinatus, a Smith [employer] Nephew Regeneten patch was applied over the tear site and secured using appropriate bone and soft tissue staples. An apparent watertight closure was obtained.  The bicipital groove was identified by palpation and opened for 1-1.5 cm. The biceps tendon stump was retrieved through this defect. The floor of the bicipital groove was roughened with a curet before another Biomet 2.9 mm JuggerKnot anchor was inserted. Both sets of sutures were passed through the biceps tendon and tied securely to effect the tenodesis. The bicipital sheath was reapproximated using two #0 Ethibond interrupted sutures, incorporating the biceps tendon to further reinforce the tenodesis.  The wound was copiously irrigated with sterile saline solution before the deltoid raphae was reapproximated using 2-0  Vicryl interrupted sutures. The subcutaneous tissues were closed in two layers using 2-0 Vicryl interrupted sutures before the skin was closed using staples. The portal sites also were closed using staples. A sterile bulky dressing was applied to the shoulder before the arm was placed into a shoulder immobilizer. The patient was then awakened, extubated, and returned to the recovery room in satisfactory condition after tolerating the procedure well.

## 2019-01-04 NOTE — Anesthesia Postprocedure Evaluation (Signed)
Anesthesia Post Note  Patient: Brenda Hicks  Procedure(s) Performed: SHOULDER ARTHROSCOPY WITH DEBRIDEMENT AND SUBACROMIAL DECOMPRESSION-RIGHT (Right ) SHOULDER ARTHROSCOPY WITH ROTATOR CUFF REPAIR (Right Shoulder)  Patient location during evaluation: PACU Anesthesia Type: General Level of consciousness: awake and alert Pain management: pain level controlled Vital Signs Assessment: post-procedure vital signs reviewed and stable Respiratory status: spontaneous breathing, nonlabored ventilation, respiratory function stable and patient connected to nasal cannula oxygen Cardiovascular status: blood pressure returned to baseline and stable Postop Assessment: no apparent nausea or vomiting Anesthetic complications: no     Last Vitals:  Vitals:   01/04/19 1528 01/04/19 1540  BP: (!) 162/87 (!) 160/85  Pulse: 81 85  Resp: 16   Temp: (!) 36.3 C 36.6 C  SpO2: 92% 93%    Last Pain:  Vitals:   01/04/19 1540  TempSrc: Temporal  PainSc: Brenda Hicks

## 2019-01-04 NOTE — Anesthesia Preprocedure Evaluation (Signed)
Anesthesia Evaluation  Patient identified by MRN, date of birth, ID band Patient awake    Reviewed: Allergy & Precautions, H&P , NPO status , Patient's Chart, lab work & pertinent test results, reviewed documented beta blocker date and time   Airway Mallampati: II  TM Distance: >3 FB Neck ROM: full    Dental  (+) Teeth Intact   Pulmonary asthma , sleep apnea , former smoker,    Pulmonary exam normal        Cardiovascular hypertension, negative cardio ROS Normal cardiovascular exam Rhythm:regular Rate:Normal     Neuro/Psych  Neuromuscular disease CVA negative psych ROS   GI/Hepatic Neg liver ROS, GERD  ,  Endo/Other  diabetesMorbid obesity  Renal/GU Renal disease  negative genitourinary   Musculoskeletal   Abdominal   Peds  Hematology  (+) Blood dyscrasia, anemia ,   Anesthesia Other Findings Past Medical History: No date: Anemia     Comment:  vitamin d deficiency No date: Arthritis No date: Asthma     Comment:  WELL CONTROLLED 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     Comment:  skin cancer left ear No date: Diabetes mellitus without complication (HCC) No date: Fatty liver No date: Hypertension No date: Kidney cysts     Comment:  per patient, never had No date: Renal disorder No date: Sleep apnea     Comment:  USES CPAP. waiting for new machine and a new sleep study May or June 2019: Stroke [facility])     Comment:  TIA. no residual symptoms Past Surgical History: 08/09/2018: ANTERIOR CERVICAL DECOMP/DISCECTOMY FUSION; N/A     Comment:  Procedure: ANTERIOR CERVICAL DECOMPRESSION/DISCECTOMY               FUSION 1 LEVEL- C4-5;  Surgeon: Meade Maw, MD;                Location: [facility] ORS;  Service: Neurosurgery;  Laterality:               N/A; No date: BACK SURGERY No date: DILATION AND CURETTAGE OF UTERUS No date: ENDOMETRIAL BIOPSY     Comment:  benign 1992: EXPLORATORY LAPAROTOMY     Comment:  REMOVAL OF RUPTURED  ECTOPIC 1998: HAND SURGERY; Right     Comment:  cyst removed 1975: HERNIA REPAIR     Comment:  UMBILICAL 8756: JOINT REPLACEMENT; Right     Comment:  TKR 2012: KNEE ARTHROSCOPY; Right 2014: KNEE SURGERY; Right     Comment:  total knee replacement 12/14/2016: SHOULDER ARTHROSCOPY WITH BICEPSTENOTOMY; Left     Comment:  Procedure: SHOULDER ARTHROSCOPY WITH BICEPSTENOTOMY;                Surgeon: Corky Mull, MD;  Location: [facility] ORS;  Service:              Orthopedics;  Laterality: Left; 12/14/2016: SHOULDER ARTHROSCOPY WITH OPEN ROTATOR CUFF REPAIR; Left     Comment:  Procedure: SHOULDER ARTHROSCOPY WITH OPEN ROTATOR CUFF               REPAIR AND ARTHROSCOPIC ROTATOR CUFF REPAIR;  Surgeon:               Corky Mull, MD;  Location: [facility] ORS;  Service:               Orthopedics;  Laterality: Left; 12/14/2016: SHOULDER ARTHROSCOPY WITH SUBACROMIAL DECOMPRESSION; Left     Comment:  Procedure: SHOULDER ARTHROSCOPY WITH SUBACROMIAL  DECOMPRESSION;  Surgeon: Corky Mull, MD;  Location:               [facility] ORS;  Service: Orthopedics;  Laterality: Left; No date: TUBAL LIGATION BMI    Body Mass Index:  56.79 kg/m     Reproductive/Obstetrics negative OB ROS                             Anesthesia Physical Anesthesia Plan  ASA: III  Anesthesia Plan: General ETT   Post-op Pain Management:    Induction:   PONV Risk Score and Plan:   Airway Management Planned:   Additional Equipment:   Intra-op Plan:   Post-operative Plan:   Informed Consent: I have reviewed the patients History and Physical, chart, labs and discussed the procedure including the risks, benefits and alternatives for the proposed anesthesia with the patient or authorized representative who has indicated his/her understanding and acceptance.     Dental Advisory Given  Plan Discussed with: CRNA  Anesthesia Plan Comments:         Anesthesia Quick Evaluation

## 2019-01-05 ENCOUNTER — Encounter: Payer: Self-pay | Admitting: Surgery

## 2019-01-09 IMAGING — CT CT ANGIO CHEST
2 of 6 series · 18 of 46 positions shown · IV contrast (APPLIED)
Comparison: Chest radiograph 03/29/2018

CLINICAL DATA: Chest pain and shortness of breath

EXAM:
CT ANGIOGRAPHY CHEST WITH CONTRAST
TECHNIQUE: Multidetector CT imaging of the chest was performed using the
standard protocol during bolus administration of intravenous
contrast. Multiplanar CT image reconstructions and MIPs were
obtained to evaluate the vascular anatomy.
CONTRAST:  75mL [id number] IOPAMIDOL ([id number]) INJECTION 76%

[Series 6: thins · axial · 0.72mm/px · z∈[-560,-302]mm · 15 of 285 slices shown]
[im 13/285  lung]
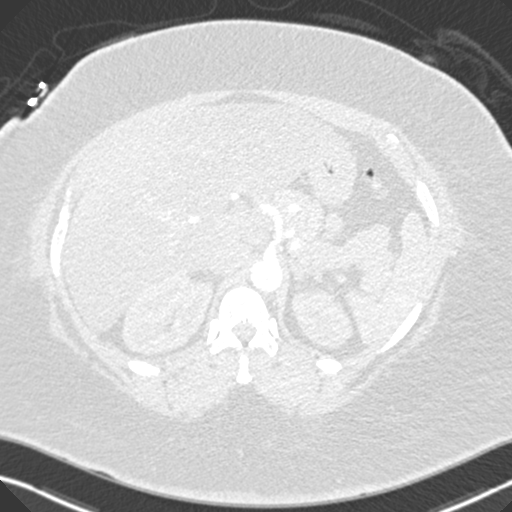
[im 38/285  soft-tissue]
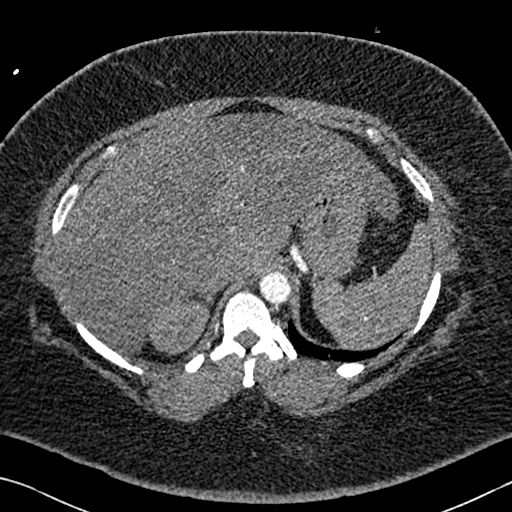
[im 50/285  lung]
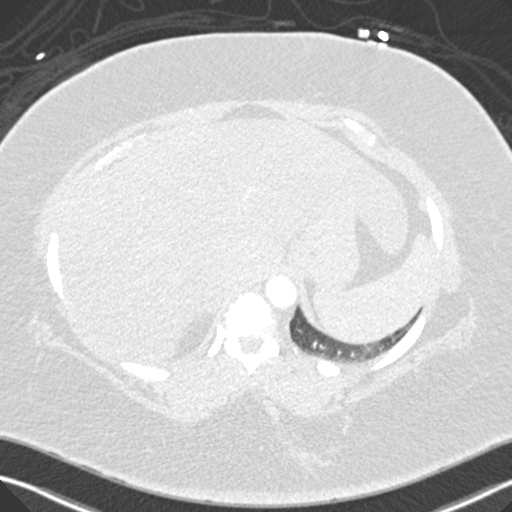
[im 75/285  soft-tissue]
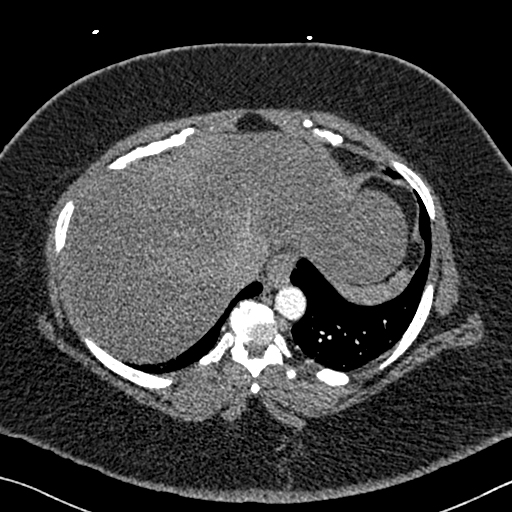
[im 87/285  lung]
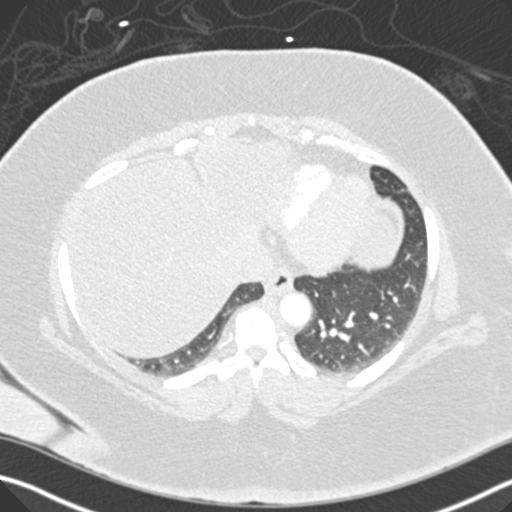
[im 112/285  soft-tissue]
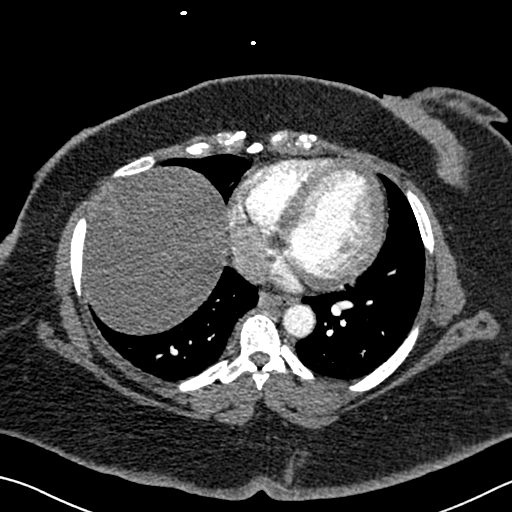
[im 124/285  lung]
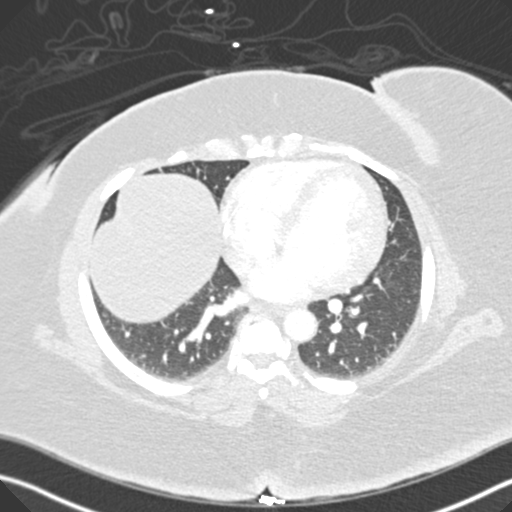
[im 149/285  soft-tissue]
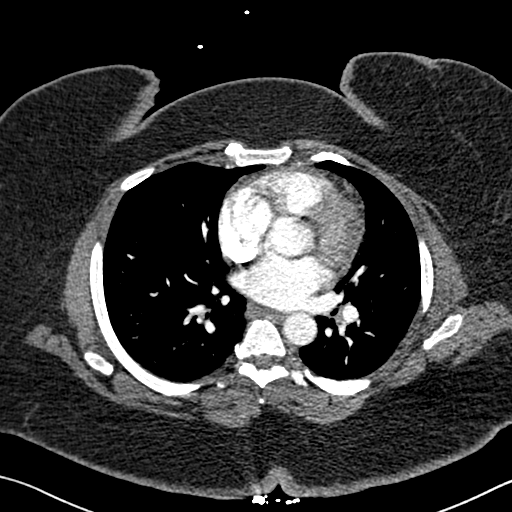
[im 161/285  lung]
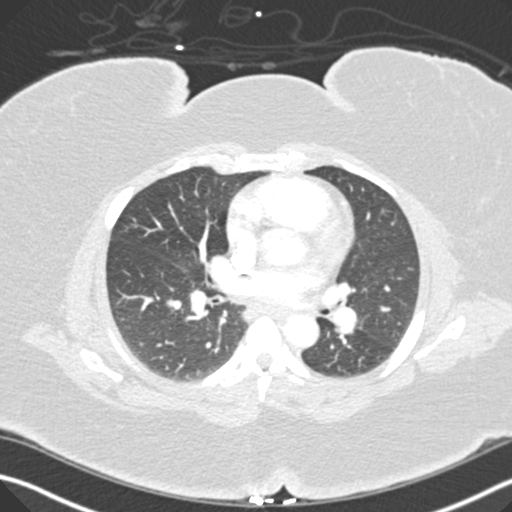
[im 173/285  soft-tissue]
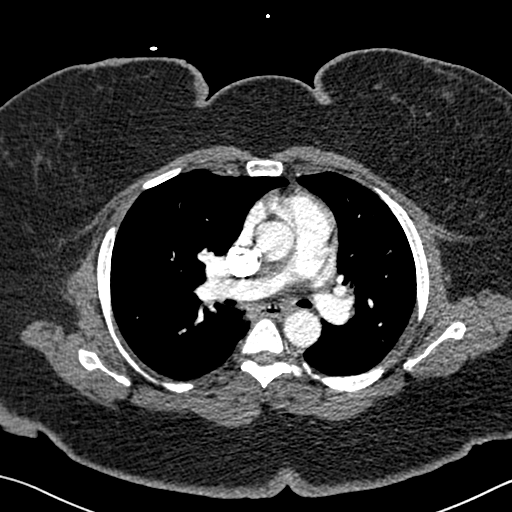
[im 198/285  lung]
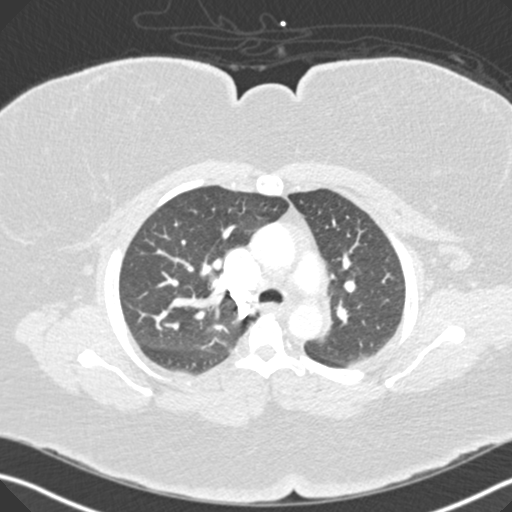
[im 210/285  soft-tissue]
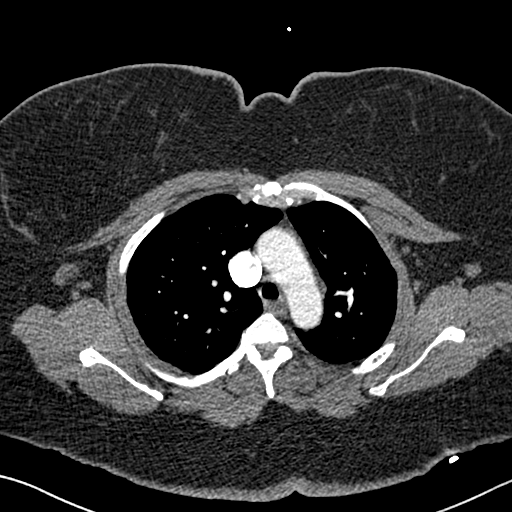
[im 235/285  lung]
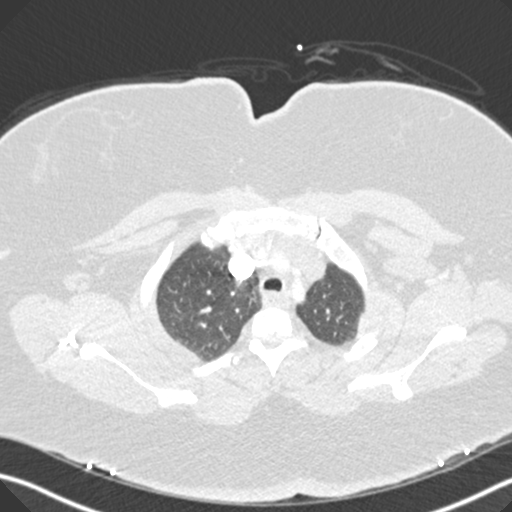
[im 247/285  soft-tissue]
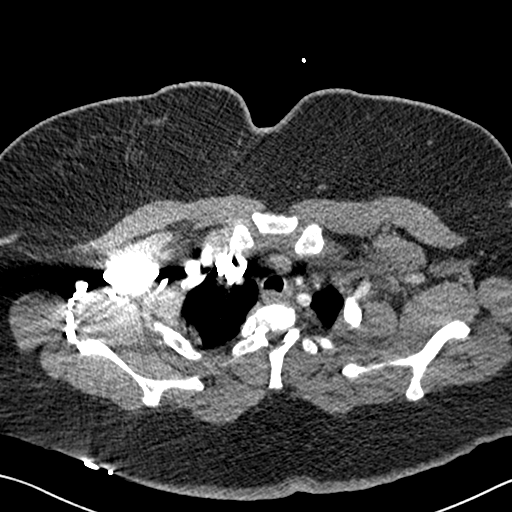
[im 272/285  lung]
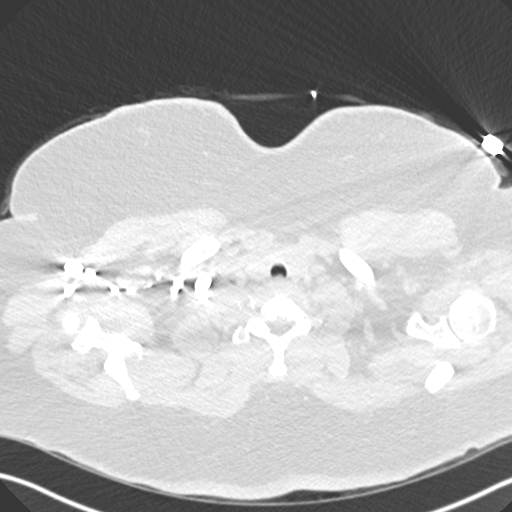

[Series 8: coronal mpr · coronal · 0.56mm/px · 3 of 90 slices shown]
[im 23/90  soft-tissue]
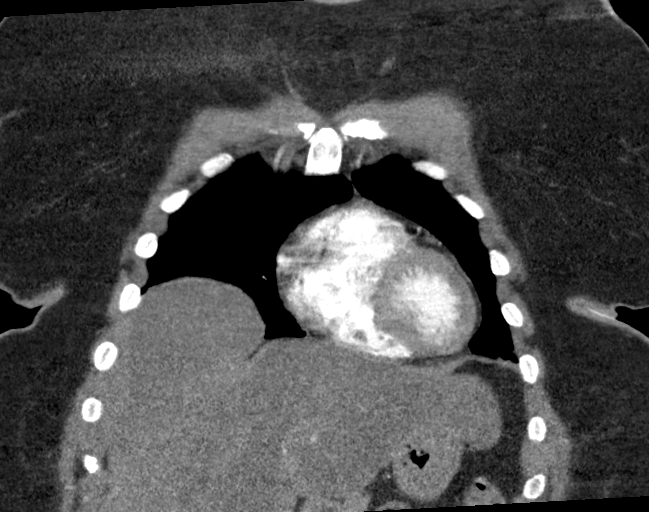
[im 45/90  soft-tissue]
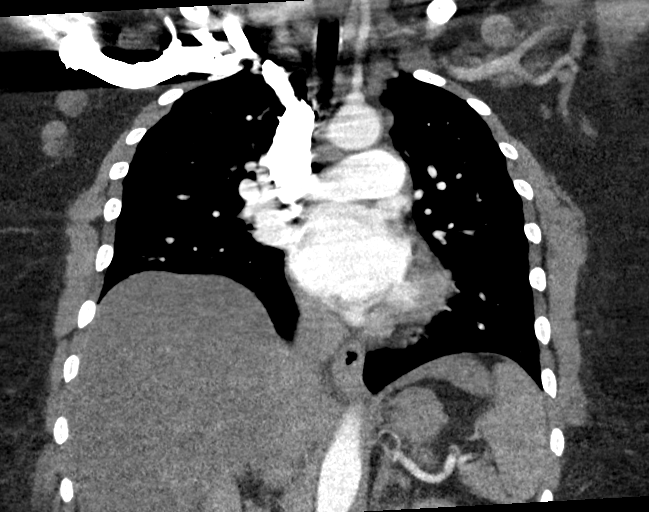
[im 67/90  soft-tissue]
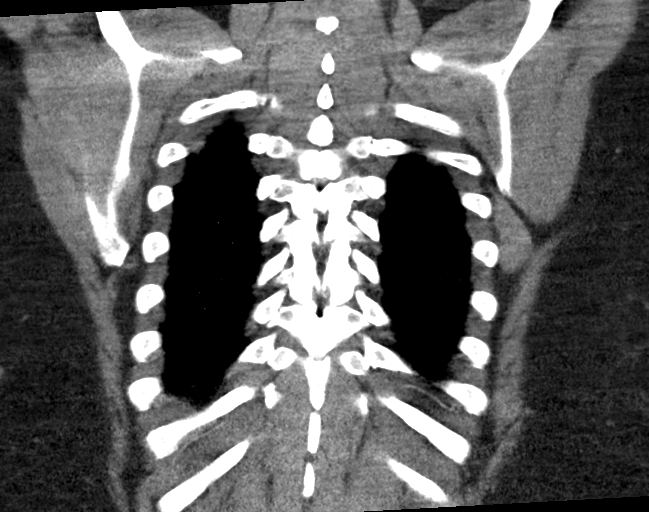

[18 of 46 positions shown; findings below may reference images not displayed]

FINDINGS: Cardiovascular: Contrast injection is sufficient to demonstrate
satisfactory opacification of the pulmonary arteries to the
segmental level. There is no pulmonary embolus. The main pulmonary
artery is within normal limits for size. There is a normal 3-vessel
arch branching pattern without evidence of acute aortic syndrome.
There is noaortic atherosclerosis. Heart size is normal, without
pericardial effusion.

Mediastinum/Nodes: No mediastinal, hilar or axillary
lymphadenopathy. The visualized thyroid and thoracic esophageal
course are unremarkable.

Lungs/Pleura: No pulmonary nodules or masses. No pleural effusion or
pneumothorax. No focal airspace consolidation. No focal pleural
abnormality.

Upper Abdomen: Contrast bolus timing is not optimized for evaluation
of the abdominal organs. Hepatomegaly with hepatic steatosis.

Musculoskeletal: No chest wall abnormality. No acute or significant
osseous findings.

Review of the MIP images confirms the above findings.
IMPRESSION: 1. No pulmonary embolus or acute aortic syndrome.
2. Clear lungs.
3. Hepatomegaly and attic steatosis.

## 2019-01-10 IMAGING — CT CT ANGIO NECK
2 of 10 series · 7 of 35 positions shown · IV contrast (iopamidol)
Comparison: Head and face CTs 10/13/2016. Cervical spine MRI
05/10/2017.

CLINICAL DATA: 52-year-old female with weakness in the extremities
beginning 1 hour ago. Code stroke.

EXAM:
CT ANGIOGRAPHY HEAD AND NECK
TECHNIQUE: Multidetector CT imaging of the head and neck was performed using
the standard protocol during bolus administration of intravenous
contrast. Multiplanar CT image reconstructions and MIPs were
obtained to evaluate the vascular anatomy. Carotid stenosis
measurements (when applicable) are obtained utilizing NASCET
criteria, using the distal internal carotid diameter as the
denominator.
CONTRAST:  75mL [id number] IOPAMIDOL ([id number]) INJECTION 76%

[Series 7: ax thin · axial · 0.46mm/px · z∈[-340,-95]mm · 6 of 346 slices shown]
[im 50/346  soft-tissue]
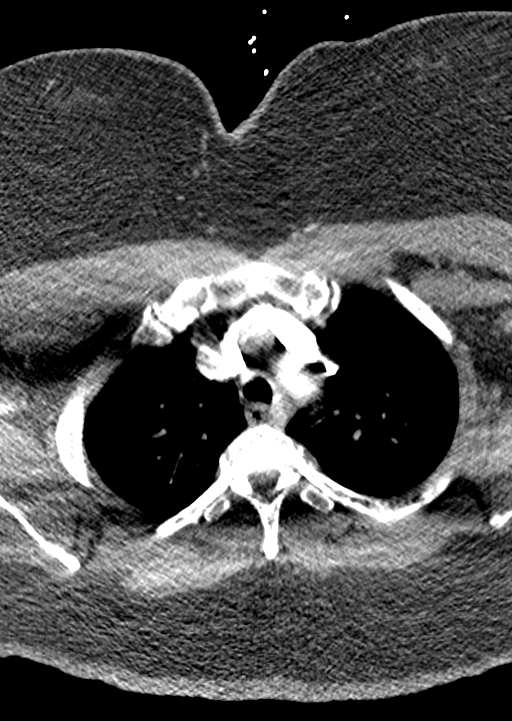
[im 99/346  bone]
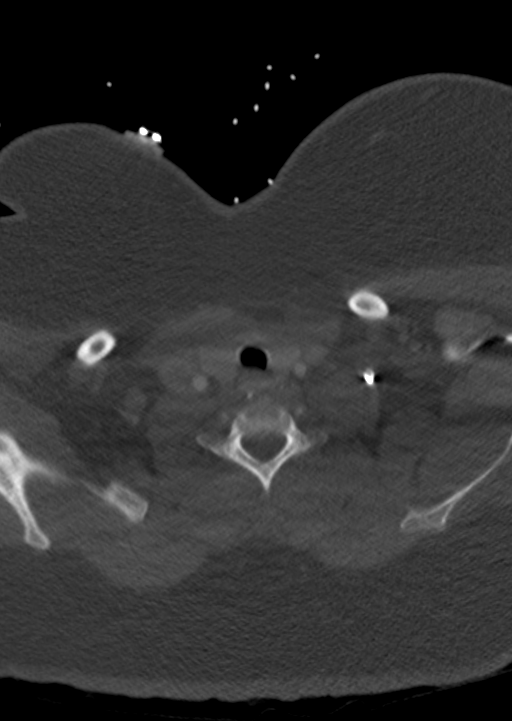
[im 148/346  soft-tissue]
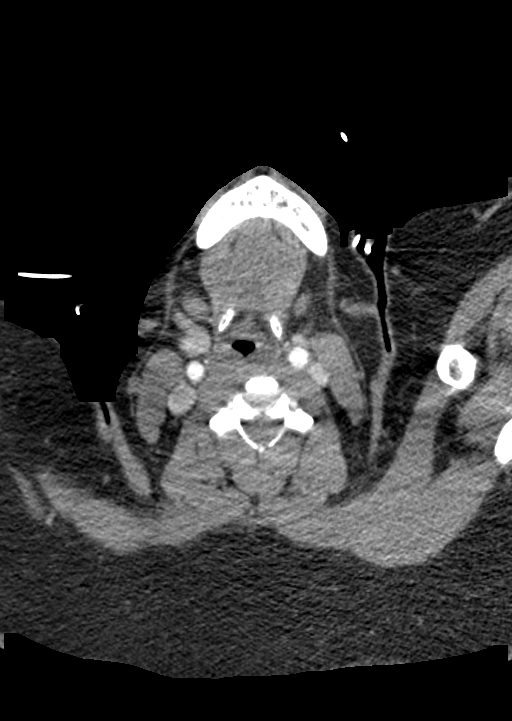
[im 198/346  bone]
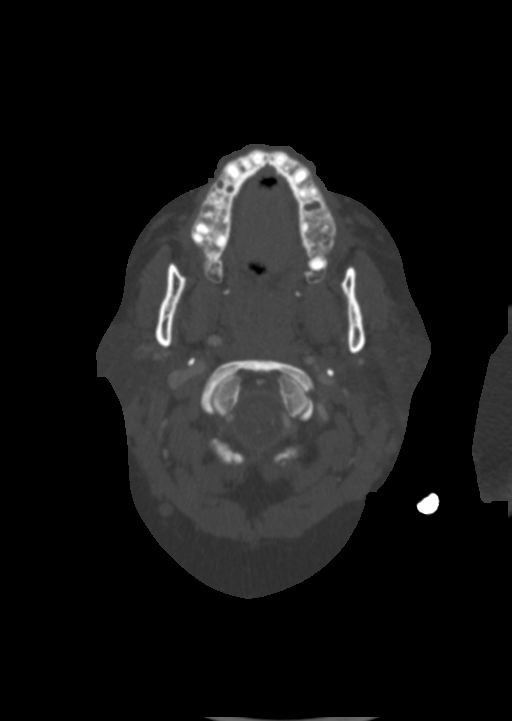
[im 247/346  soft-tissue]
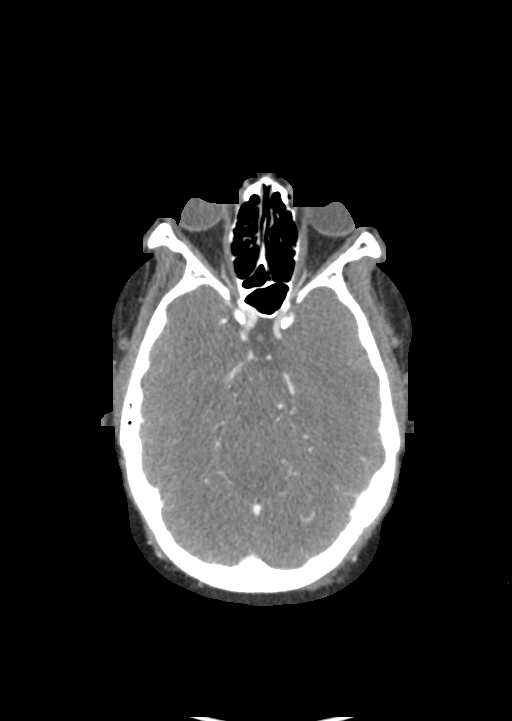
[im 296/346  bone]
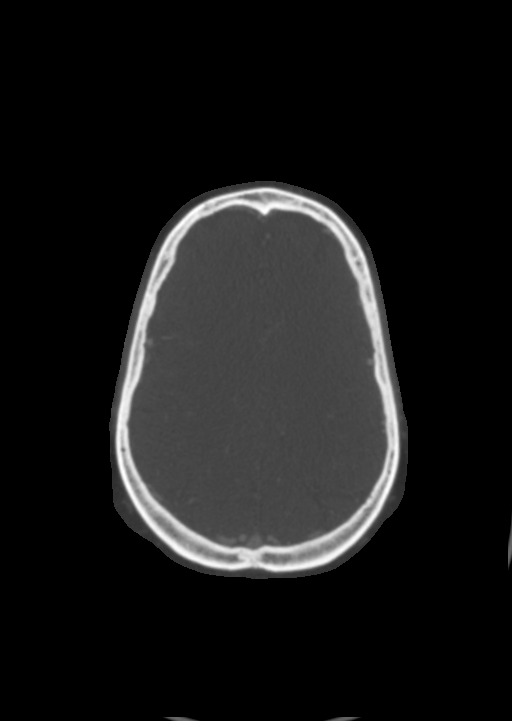

[Series 9: sagittal thin · sagittal · 0.64mm/px · 1 of 160 slices shown]
[im 103/160  soft-tissue]
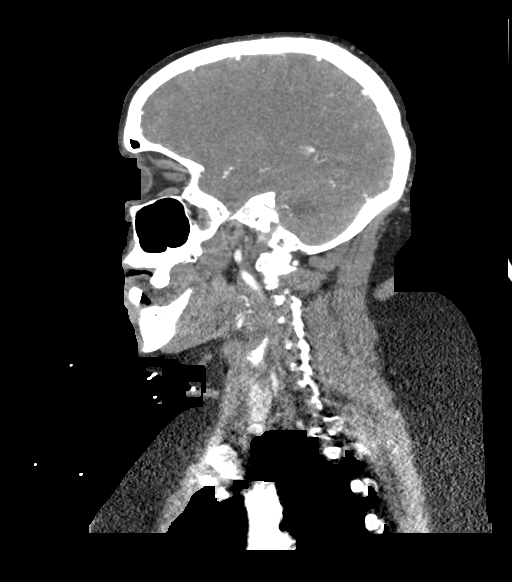

[7 of 35 positions shown; findings below may reference images not displayed]

FINDINGS: CT HEAD FINDINGS

Brain: Cerebral volume remains normal. No midline shift,
ventriculomegaly, mass effect, evidence of mass lesion, or
cortically based acute infarction.

ASPECTS 10.

Within the medial periventricular white matter of the posterior left
temporal lobe a new small 6 mm hyperdense area seen on series 13,
image 13 on the reformatted images appears to reflect stable normal
choroid plexus density (compare sagittal image 36 today to sagittal
image 39 in 2735).

No acute intracranial hemorrhage identified. Gray-white matter
differentiation remains normal. Chronic partially empty sella.

Calvarium and skull base: Stable and negative.

Paranasal sinuses: Stable and negative.

Orbits: Stable and negative.

CTA NECK

Skeleton: No acute osseous abnormality identified.

Upper chest: Negative lung apices. No superior mediastinal
lymphadenopathy.

Other neck: The glottis is closed. Large body habitus. Negative for
neck mass or lymphadenopathy.

Aortic arch: Obscured proximal great vessels due to dense left
subclavian and innominate vein contrast streak artifact. No aortic
arch atherosclerosis is evident.

Right carotid system: The right CCA origin is visible and normal.
The right CCA, right carotid bifurcation and cervical right ICA are
patent and appear negative.

Left carotid system: The left CCA origin is obscured. The left CCA
is normally enhancing at the level of the thyroid. The left carotid
bifurcation and cervical left ICA are patent without stenosis. The
left ICA appears non dominant relative to the right, see
intracranial findings below.

Vertebral arteries:
The proximal right subclavian artery is patent, its origin is
grossly normal. The right vertebral artery origin appears normal.
The right vertebral artery is patent to the skull base. No stenosis
is evident.

Proximal left subclavian artery is patent. The left vertebral artery
origin appears normal. The left vertebral artery is patent to the
skull base. No stenosis is evident.

CTA HEAD

Posterior circulation: The distal vertebral arteries are codominant
and appear normal to the vertebrobasilar junction. Patent left PICA
origin. The right AICA may be dominant. Patent basilar artery, AICA
origins the and SCA origins. The left PCA origin is normal while
there is a fetal type right PCA origin. A small left posterior
communicating artery is visible. The bilateral PCA branches are
within normal limits.

Anterior circulation: Both ICA siphons are patent and the right is
dominant owing to a fetal type right PCA origin and dominant right
ACA A1 segment with diminutive or absent left ACA A1. No siphon
atherosclerosis or stenosis is identified. The posterior
communicating artery origins are normal.

The MCA and right ACA origins are normal. The anterior communicating
artery is normal. The proximal A2 segments are mildly tortuous. The
bilateral ACA branches are within normal limits. The right MCA M1
segment, bifurcation, and right MCA branches are within normal
limits. The left MCA M1 segment, left MCA bifurcation, and left MCA
branches appear patent and within normal limits.

Venous sinuses: Patent.

Anatomic variants: Fetal type right PCA origin. Dominant right and
diminutive or absent left ACA A1 segments.

Review of the MIP images confirms the above findings
IMPRESSION: 1. No acute cortically based infarct or acute intracranial
hemorrhage identified by CT.
Preliminary report of this was communicated to Dr. Jianna Shumway
at 0010 hours on 03/30/2018by text page via the [employer] messaging
system.

2. CTA is negative for large vessel occlusion.
This result was communicated to Dr. Jianna Shumway at 4579 hours
via the [employer] messaging system.

3. Stable noncontrast CT appearance of the brain since 2735,
negative aside from chronic partially empty sella.

4. The great vessel origins are obscured by dense venous contrast at
the left thoracic inlet. Otherwise no atherosclerosis or arterial
stenosis is identified.

## 2019-01-11 IMAGING — MR MR HEAD W/O CM
10 series · 48 of 48 positions shown · non-contrast
Comparison: CT yesterday.

CLINICAL DATA: Left-sided weakness

EXAM:
MRI HEAD WITHOUT CONTRAST
TECHNIQUE: Multiplanar, multiecho pulse sequences of the brain and surrounding
structures were obtained without intravenous contrast.

[Series 2: T1 · sagittal · 5.0mm · 0.45mm/px · 2 of 23 slices shown (1 of 2)]
[im 1/23]
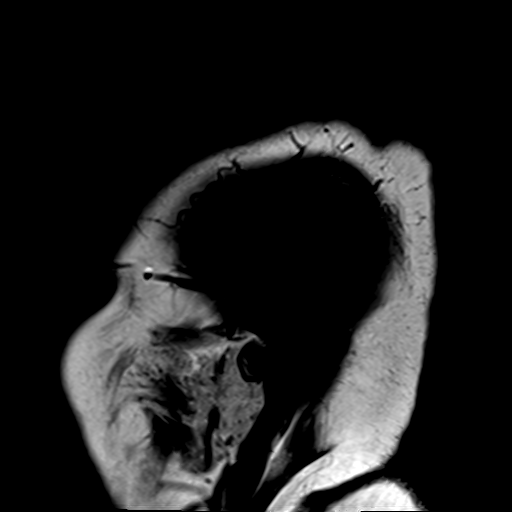
[im 23/23]
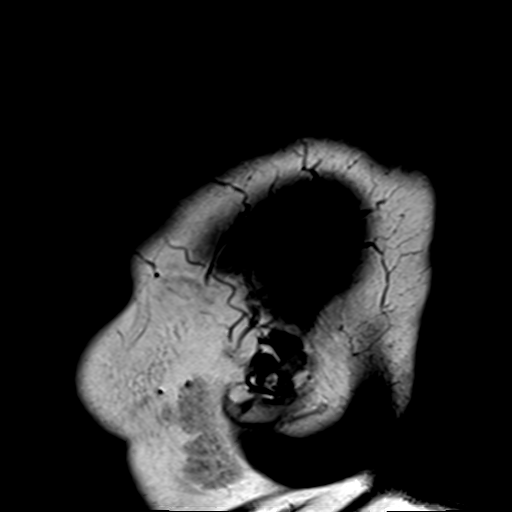

[Series 4: DWI · axial · 3.0mm · 1.80mm/px · z∈[-114,+47]mm · 5 of 54 slices shown (1 of 4)]
[im 1/54]
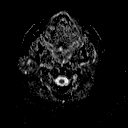
[im 14/54]
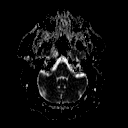
[im 27/54]
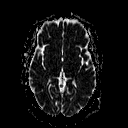
[im 40/54]
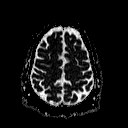
[im 54/54]
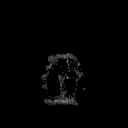

[Series 6: DWI · coronal · 3.0mm · 1.80mm/px · 4 of 47 slices shown (2 of 4)]
[im 1/47]
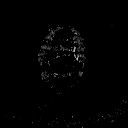
[im 16/47]
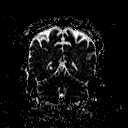
[im 31/47]
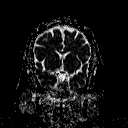
[im 47/47]
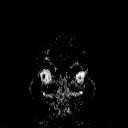

[Series 7: T2 · axial · 5.0mm · 0.60mm/px · z∈[-113,+42]mm · 2 of 25 slices shown (1 of 3)]
[im 1/25]
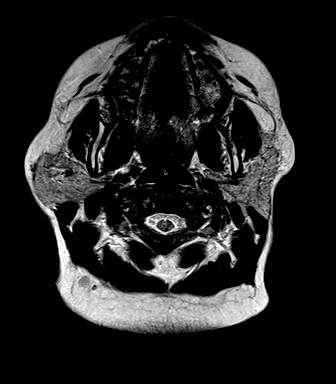
[im 25/25]
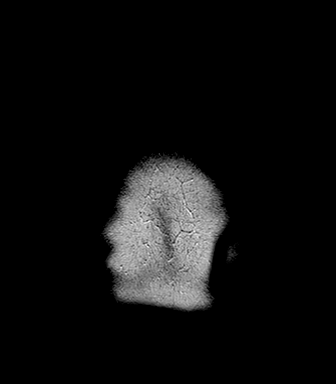

[Series 8: FLAIR · axial · 3.0mm · 0.45mm/px · z∈[-113,+42]mm · 5 of 53 slices shown]
[im 1/53]
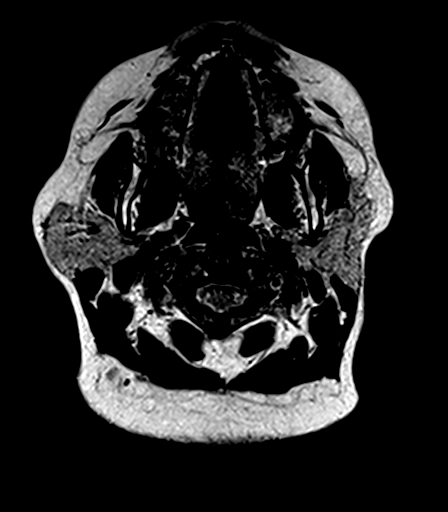
[im 14/53]
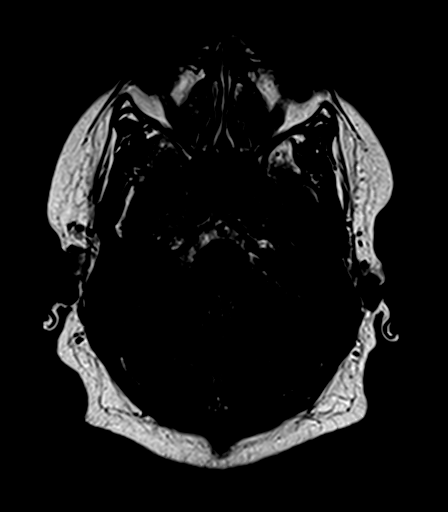
[im 27/53]
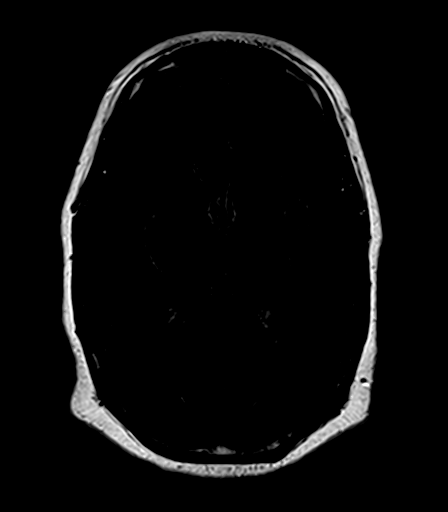
[im 40/53]
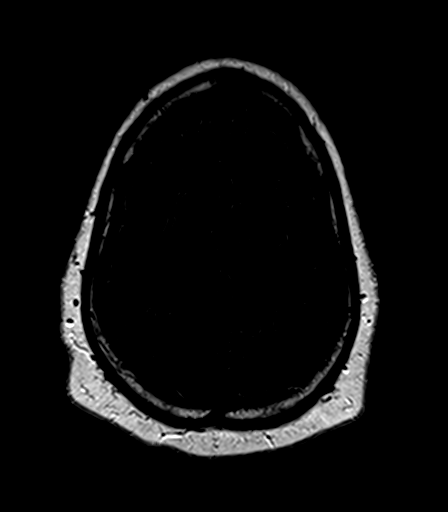
[im 53/53]
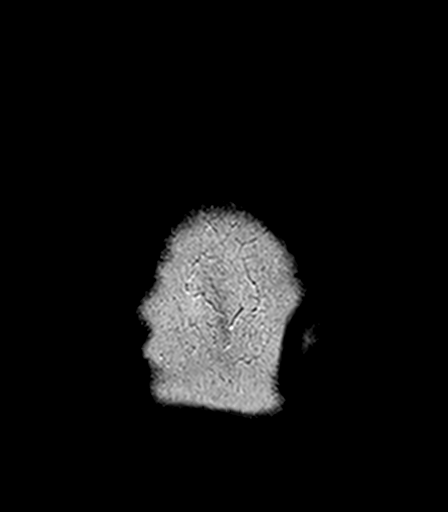

[Series 9: T2 · axial · 5.0mm · 0.45mm/px · z∈[-113,+42]mm · 2 of 25 slices shown (2 of 3)]
[im 1/25]
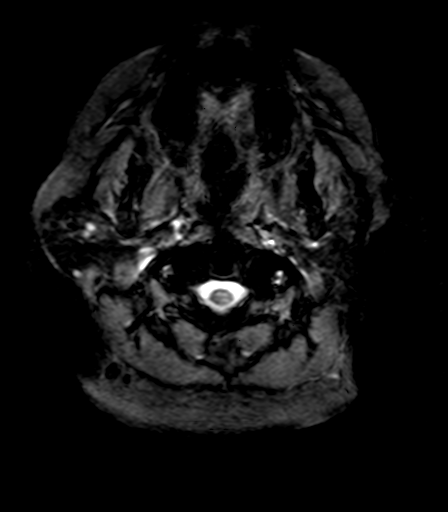
[im 25/25]
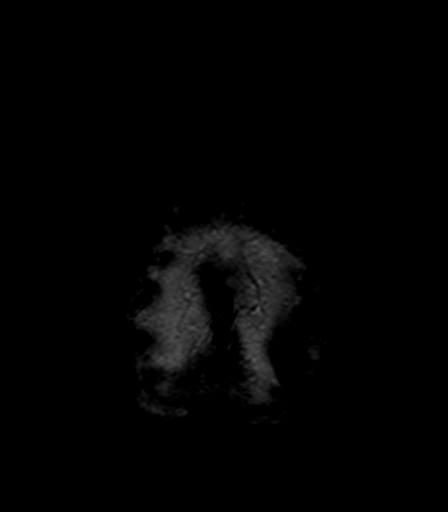

[Series 10: T1 · axial · 1.0mm · 1.00mm/px · z∈[-120,+53]mm · 16 of 176 slices shown (2 of 2)]
[im 1/176]
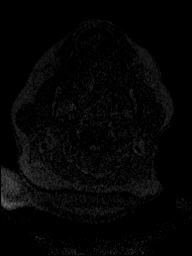
[im 12/176]
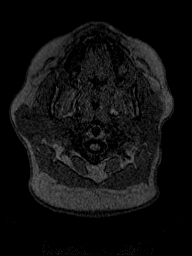
[im 24/176]
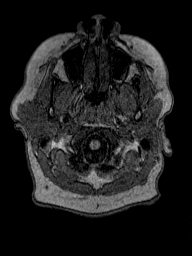
[im 36/176]
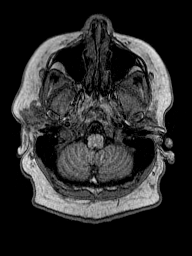
[im 47/176]
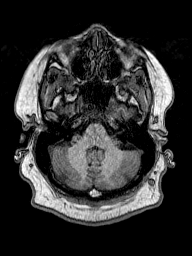
[im 59/176]
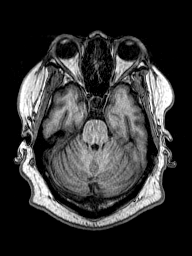
[im 71/176]
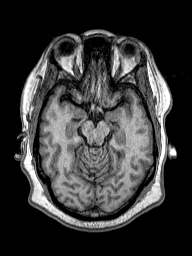
[im 82/176]
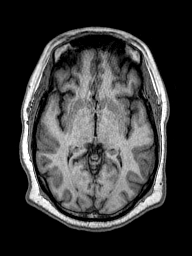
[im 94/176]
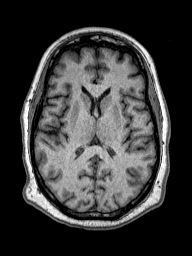
[im 106/176]
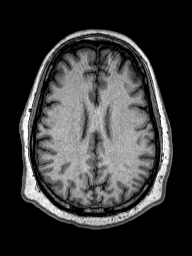
[im 117/176]
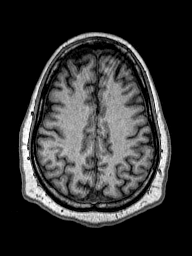
[im 129/176]
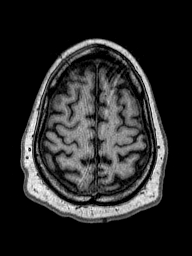
[im 141/176]
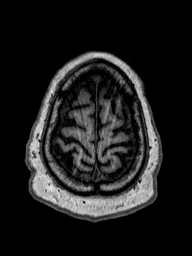
[im 152/176]
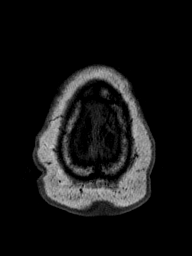
[im 164/176]
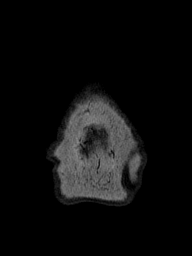
[im 176/176]
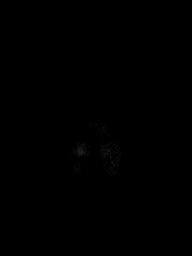

[Series 11: T2 · coronal · 5.0mm · 0.49mm/px · 3 of 29 slices shown (3 of 3)]
[im 1/29]
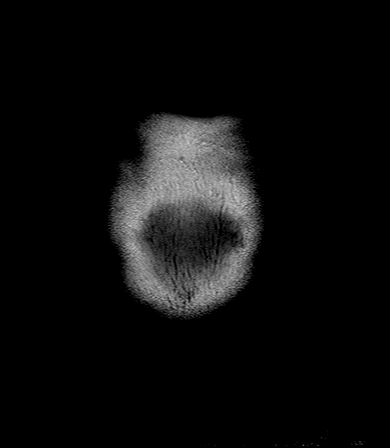
[im 15/29]
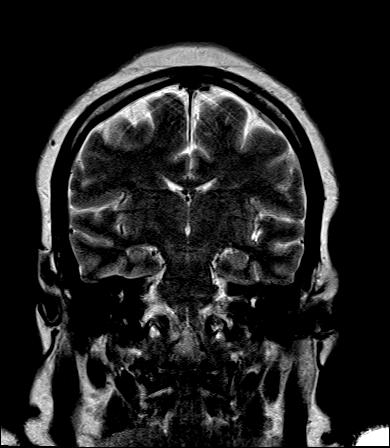
[im 29/29]
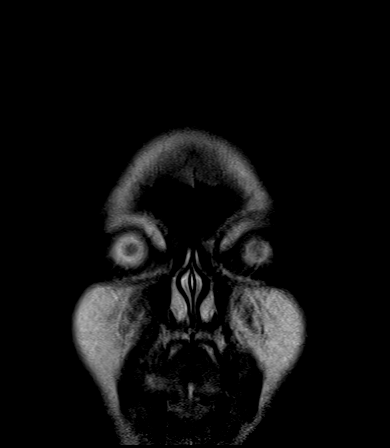

[Series 100: DWI · axial · 3.0mm · 1.80mm/px · z∈[-114,+47]mm · 5 of 55 slices shown (3 of 4)]
[im 1/55]
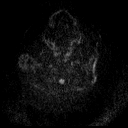
[im 14/55]
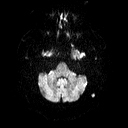
[im 28/55]
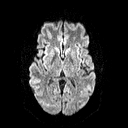
[im 41/55]
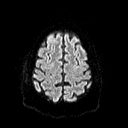
[im 55/55]
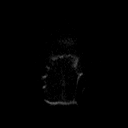

[Series 101: DWI · coronal · 3.0mm · 1.80mm/px · 4 of 46 slices shown (4 of 4)]
[im 1/46]
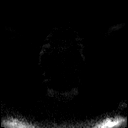
[im 16/46]
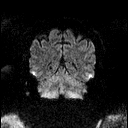
[im 31/46]
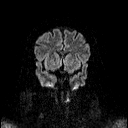
[im 46/46]
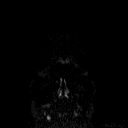

[48 of 48 positions shown; findings below may reference images not displayed]

FINDINGS: Brain: Brain has normal appearance without evidence of malformation,
atrophy, old or acute small or large vessel infarction, mass lesion,
hemorrhage, hydrocephalus or extra-axial collection.

Vascular: Major vessels at the base of the brain show flow. Venous
sinuses appear patent.

Skull and upper cervical spine: Normal.

Sinuses/Orbits: Clear/normal.

Other: None significant.
IMPRESSION: Normal examination. No abnormality seen to explain left-sided
weakness.

## 2019-01-19 ENCOUNTER — Encounter: Payer: Self-pay | Admitting: Certified Nurse Midwife

## 2019-01-19 ENCOUNTER — Ambulatory Visit: Payer: Medicaid Other | Admitting: Certified Nurse Midwife

## 2019-01-19 VITALS — BP 134/86 | HR 86 | Ht 65.0 in | Wt 353.1 lb

## 2019-01-19 DIAGNOSIS — Z30432 Encounter for removal of intrauterine contraceptive device: Secondary | ICD-10-CM

## 2019-01-19 NOTE — Progress Notes (Signed)
  GYNECOLOGY OFFICE PROCEDURE NOTE  Brenda Hicks is a 53 y.o. [id number] here for  IUD removal. No GYN concerns.  Last pap smear was on several yrs ago and normal per pt.   IUD Removal  Patient identified, informed consent performed, consent signed.  Patient was in the dorsal lithotomy position, normal external genitalia was noted.  A speculum was placed in the patient's vagina, normal discharge was noted, no lesions. The cervix was visualized, no lesions, no abnormal discharge.  The strings of the IUD were grasped and pulled using ring forceps. The IUD was removed in its entirety.   Patient tolerated the procedure well.    Patient will use condoms for contraception . Reviewed signs and symptoms of menopause. Discussed using contraception until she has been without a period for 1 yr. Encouraged pt to return for pap smear.  Routine preventative health maintenance measures emphasized.  Philip Aspen, CNM

## 2019-01-19 NOTE — Patient Instructions (Signed)
Contraception Choices Contraception, also called birth control, means things to use or ways to try not to get pregnant. Hormonal birth control This kind of birth control uses hormones. Here are some types of hormonal birth control:  A tube that is put under skin of the arm (implant). The tube can stay in for as long as 3 years.  Shots to get every 3 months (injections).  Pills to take every day (birth control pills).  A patch to change 1 time each week for 3 weeks (birth control patch). After that, the patch is taken off for 1 week.  A ring to put in the vagina. The ring is left in for 3 weeks. Then it is taken out of the vagina for 1 week. Then a new ring is put in.  Pills to take after unprotected sex (emergency birth control pills). Barrier birth control Here are some types of barrier birth control:  A thin covering that is put on the penis before sex (female condom). The covering is thrown away after sex.  A soft, loose covering that is put in the vagina before sex (female condom). The covering is thrown away after sex.  A rubber bowl that sits over the cervix (diaphragm). The bowl must be made for you. The bowl is put into the vagina before sex. The bowl is left in for 6-8 hours after sex. It is taken out within 24 hours.  A small, soft cup that fits over the cervix (cervical cap). The cup must be made for you. The cup can be left in for 6-8 hours after sex. It is taken out within 48 hours.  A sponge that is put into the vagina before sex. It must be left in for at least 6 hours after sex. It must be taken out within 30 hours. Then it is thrown away.  A chemical that kills or stops sperm from getting into the uterus (spermicide). It may be a pill, cream, jelly, or foam to put in the vagina. The chemical should be used at least 10-15 minutes before sex. IUD (intrauterine) birth control An IUD is a small, T-shaped piece of plastic. It is put inside the uterus. There are two  kinds:  Hormone IUD. This kind can stay in for 3-5 years.  Copper IUD. This kind can stay in for 10 years. Permanent birth control Here are some types of permanent birth control:  Surgery to block the fallopian tubes.  Having an insert put into each fallopian tube.  Surgery to tie off the tubes that carry sperm (vasectomy). Natural planning birth control Here are some types of natural planning birth control:  Not having sex on the days the woman could get pregnant.  Using a calendar: ? To keep track of the length of each period. ? To find out what days pregnancy can happen. ? To plan to not have sex on days when pregnancy can happen.  Watching for symptoms of ovulation and not having sex during ovulation. One way the woman can check for ovulation is to check her temperature.  Waiting to have sex until after ovulation. Summary  Contraception, also called birth control, means things to use or ways to try not to get pregnant.  Hormonal methods of birth control include implants, injections, pills, patches, vaginal rings, and emergency birth control pills.  Barrier methods of birth control can include female condoms, female condoms, diaphragms, cervical caps, sponges, and spermicides.  There are two types of IUD (intrauterine device) birth control.   pills, patches, vaginal rings, and emergency birth control pills.   Barrier methods of birth control can include female condoms, female condoms, diaphragms, cervical caps, sponges, and spermicides.   There are two types of IUD (intrauterine device) birth control. An IUD can be put in a woman's uterus to prevent pregnancy for 3-5 years.   Permanent sterilization can be done through a procedure for males, females, or both.   Natural planning methods involve not having sex on the days when the woman could get pregnant.  This information is not intended to replace advice given to you by your health care provider. Make sure you discuss any questions you have with your health care provider.  Document Released: 09/05/2009 Document Revised: 06/15/2018 Document Reviewed: 11/18/2016  Elsevier Interactive Patient Education  2019 [employer]

## 2019-01-24 ENCOUNTER — Encounter: Payer: Medicaid Other | Admitting: Certified Nurse Midwife

## 2019-01-29 ENCOUNTER — Other Ambulatory Visit (HOSPITAL_COMMUNITY)
Admission: RE | Admit: 2019-01-29 | Discharge: 2019-01-29 | Disposition: A | Discharge status: Home / Self Care | Payer: Medicaid Other | Source: Ambulatory Visit | Attending: Certified Nurse Midwife | Admitting: Certified Nurse Midwife

## 2019-01-29 ENCOUNTER — Ambulatory Visit: Payer: Medicaid Other | Admitting: Certified Nurse Midwife

## 2019-01-29 VITALS — BP 135/92 | HR 79 | Ht 65.0 in | Wt 344.5 lb

## 2019-01-29 DIAGNOSIS — Z1239 Encounter for other screening for malignant neoplasm of breast: Secondary | ICD-10-CM | POA: Insufficient documentation

## 2019-01-29 DIAGNOSIS — Z124 Encounter for screening for malignant neoplasm of cervix: Secondary | ICD-10-CM

## 2019-01-29 DIAGNOSIS — N898 Other specified noninflammatory disorders of vagina: Secondary | ICD-10-CM | POA: Diagnosis not present

## 2019-01-29 DIAGNOSIS — Z01419 Encounter for gynecological examination (general) (routine) without abnormal findings: Secondary | ICD-10-CM | POA: Insufficient documentation

## 2019-01-29 DIAGNOSIS — Z0000 Encounter for general adult medical examination without abnormal findings: Secondary | ICD-10-CM

## 2019-01-29 NOTE — Patient Instructions (Signed)
Preventive Care 40-64 Years, Female Preventive care refers to lifestyle choices and visits with your health care provider that can promote health and wellness. What does preventive care include?   A yearly physical exam. This is also called an annual well check.  Dental exams once or twice a year.  Routine eye exams. Ask your health care provider how often you should have your eyes checked.  Personal lifestyle choices, including: ? Daily care of your teeth and gums. ? Regular physical activity. ? Eating a healthy diet. ? Avoiding tobacco and drug use. ? Limiting alcohol use. ? Practicing safe sex. ? Taking low-dose aspirin daily starting at age 50. ? Taking vitamin and mineral supplements as recommended by your health care provider. What happens during an annual well check? The services and screenings done by your health care provider during your annual well check will depend on your age, overall health, lifestyle risk factors, and family history of disease. Counseling Your health care provider may ask you questions about your:  Alcohol use.  Tobacco use.  Drug use.  Emotional well-being.  Home and relationship well-being.  Sexual activity.  Eating habits.  Work and work environment.  Method of birth control.  Menstrual cycle.  Pregnancy history. Screening You may have the following tests or measurements:  Height, weight, and BMI.  Blood pressure.  Lipid and cholesterol levels. These may be checked every 5 years, or more frequently if you are over 50 years old.  Skin check.  Lung cancer screening. You may have this screening every year starting at age 55 if you have a 30-pack-year history of smoking and currently smoke or have quit within the past 15 years.  Colorectal cancer screening. All adults should have this screening starting at age 50 and continuing until age 75. Your health care provider may recommend screening at age 45. You will have tests every  1-10 years, depending on your results and the type of screening test. People at increased risk should start screening at an earlier age. Screening tests may include: ? Guaiac-based fecal occult blood testing. ? Fecal immunochemical test (FIT). ? Stool DNA test. ? Virtual colonoscopy. ? Sigmoidoscopy. During this test, a flexible tube with a tiny camera (sigmoidoscope) is used to examine your rectum and lower colon. The sigmoidoscope is inserted through your anus into your rectum and lower colon. ? Colonoscopy. During this test, a long, thin, flexible tube with a tiny camera (colonoscope) is used to examine your entire colon and rectum.  Hepatitis C blood test.  Hepatitis B blood test.  Sexually transmitted disease (STD) testing.  Diabetes screening. This is done by checking your blood sugar (glucose) after you have not eaten for a while (fasting). You may have this done every 1-3 years.  Mammogram. This may be done every 1-2 years. Talk to your health care provider about when you should start having regular mammograms. This may depend on whether you have a family history of breast cancer.  BRCA-related cancer screening. This may be done if you have a family history of breast, ovarian, tubal, or peritoneal cancers.  Pelvic exam and Pap test. This may be done every 3 years starting at age 21. Starting at age 30, this may be done every 5 years if you have a Pap test in combination with an HPV test.  Bone density scan. This is done to screen for osteoporosis. You may have this scan if you are at high risk for osteoporosis. Discuss your test results, treatment options,   and if necessary, the need for more tests with your health care provider. Vaccines Your health care provider may recommend certain vaccines, such as:  Influenza vaccine. This is recommended every year.  Tetanus, diphtheria, and acellular pertussis (Tdap, Td) vaccine. You may need a Td booster every 10 years.  Varicella  vaccine. You may need this if you have not been vaccinated.  Zoster vaccine. You may need this after age 60.  Measles, mumps, and rubella (MMR) vaccine. You may need at least one dose of MMR if you were born in 1957 or later. You may also need a second dose.  Pneumococcal 13-valent conjugate (PCV13) vaccine. You may need this if you have certain conditions and were not previously vaccinated.  Pneumococcal polysaccharide (PPSV23) vaccine. You may need one or two doses if you smoke cigarettes or if you have certain conditions.  Meningococcal vaccine. You may need this if you have certain conditions.  Hepatitis A vaccine. You may need this if you have certain conditions or if you travel or work in places where you may be exposed to hepatitis A.  Hepatitis B vaccine. You may need this if you have certain conditions or if you travel or work in places where you may be exposed to hepatitis B.  Haemophilus influenzae type b (Hib) vaccine. You may need this if you have certain conditions. Talk to your health care provider about which screenings and vaccines you need and how often you need them. This information is not intended to replace advice given to you by your health care provider. Make sure you discuss any questions you have with your health care provider. Document Released: 12/05/2015 Document Revised: 12/29/2017 Document Reviewed: 09/09/2015 Else[employer] Interactive Patient Education  2019 [employer]  Calorie Counting for Weight Loss Calories are units of energy. Your body needs a certain amount of calories from food to keep you going throughout the day. When you eat more calories than your body needs, your body stores the extra calories as fat. When you eat fewer calories than your body needs, your body burns fat to get the energy it needs. Calorie counting means keeping track of how many calories you eat and drink each day. Calorie counting can be helpful if you need to lose weight. If you  make sure to eat fewer calories than your body needs, you should lose weight. Ask your health care provider what a healthy weight is for you. For calorie counting to work, you will need to eat the right number of calories in a day in order to lose a healthy amount of weight per week. A dietitian can help you determine how many calories you need in a day and will give you suggestions on how to reach your calorie goal.  A healthy amount of weight to lose per week is usually 1-2 lb (0.5-0.9 kg). This usually means that your daily calorie intake should be reduced by 500-750 calories.  Eating 1,200 - 1,500 calories per day can help most women lose weight.  Eating 1,500 - 1,800 calories per day can help most men lose weight. What is my plan? My goal is to have __________ calories per day. If I have this many calories per day, I should lose around __________ pounds per week. What do I need to know about calorie counting? In order to meet your daily calorie goal, you will need to:  Find out how many calories are in each food you would like to eat. Try to do this before   you eat.  Decide how much of the food you plan to eat.  Write down what you ate and how many calories it had. Doing this is called keeping a food log. To successfully lose weight, it is important to balance calorie counting with a healthy lifestyle that includes regular activity. Aim for 150 minutes of moderate exercise (such as walking) or 75 minutes of vigorous exercise (such as running) each week. Where do I find calorie information?  The number of calories in a food can be found on a Nutrition Facts label. If a food does not have a Nutrition Facts label, try to look up the calories online or ask your dietitian for help. Remember that calories are listed per serving. If you choose to have more than one serving of a food, you will have to multiply the calories per serving by the amount of servings you plan to eat. For example, the  label on a package of bread might say that a serving size is 1 slice and that there are 90 calories in a serving. If you eat 1 slice, you will have eaten 90 calories. If you eat 2 slices, you will have eaten 180 calories. How do I keep a food log? Immediately after each meal, record the following information in your food log:  What you ate. Don't forget to include toppings, sauces, and other extras on the food.  How much you ate. This can be measured in cups, ounces, or number of items.  How many calories each food and drink had.  The total number of calories in the meal. Keep your food log near you, such as in a small notebook in your pocket, or use a mobile app or website. Some programs will calculate calories for you and show you how many calories you have left for the day to meet your goal. What are some calorie counting tips?   Use your calories on foods and drinks that will fill you up and not leave you hungry: ? Some examples of foods that fill you up are nuts and nut butters, vegetables, lean proteins, and high-fiber foods like whole grains. High-fiber foods are foods with more than 5 g fiber per serving. ? Drinks such as sodas, specialty coffee drinks, alcohol, and juices have a lot of calories, yet do not fill you up.  Eat nutritious foods and avoid empty calories. Empty calories are calories you get from foods or beverages that do not have many vitamins or protein, such as candy, sweets, and soda. It is better to have a nutritious high-calorie food (such as an avocado) than a food with few nutrients (such as a bag of chips).  Know how many calories are in the foods you eat most often. This will help you calculate calorie counts faster.  Pay attention to calories in drinks. Low-calorie drinks include water and unsweetened drinks.  Pay attention to nutrition labels for "low fat" or "fat free" foods. These foods sometimes have the same amount of calories or more calories than the  full fat versions. They also often have added sugar, starch, or salt, to make up for flavor that was removed with the fat.  Find a way of tracking calories that works for you. Get creative. Try different apps or programs if writing down calories does not work for you. What are some portion control tips?  Know how many calories are in a serving. This will help you know how many servings of a certain food you can   have.  Use a measuring cup to measure serving sizes. You could also try weighing out portions on a kitchen scale. With time, you will be able to estimate serving sizes for some foods.  Take some time to put servings of different foods on your favorite plates, bowls, and cups so you know what a serving looks like.  Try not to eat straight from a bag or box. Doing this can lead to overeating. Put the amount you would like to eat in a cup or on a plate to make sure you are eating the right portion.  Use smaller plates, glasses, and bowls to prevent overeating.  Try not to multitask (for example, watch TV or use your computer) while eating. If it is time to eat, sit down at a table and enjoy your food. This will help you to know when you are full. It will also help you to be aware of what you are eating and how much you are eating. What are tips for following this plan? Reading food labels  Check the calorie count compared to the serving size. The serving size may be smaller than what you are used to eating.  Check the source of the calories. Make sure the food you are eating is high in vitamins and protein and low in saturated and trans fats. Shopping  Read nutrition labels while you shop. This will help you make healthy decisions before you decide to purchase your food.  Make a grocery list and stick to it. Cooking  Try to cook your favorite foods in a healthier way. For example, try baking instead of frying.  Use low-fat dairy products. Meal planning  Use more fruits and  vegetables. Half of your plate should be fruits and vegetables.  Include lean proteins like poultry and fish. How do I count calories when eating out?  Ask for smaller portion sizes.  Consider sharing an entree and sides instead of getting your own entree.  If you get your own entree, eat only half. Ask for a box at the beginning of your meal and put the rest of your entree in it so you are not tempted to eat it.  If calories are listed on the menu, choose the lower calorie options.  Choose dishes that include vegetables, fruits, whole grains, low-fat dairy products, and lean protein.  Choose items that are boiled, broiled, grilled, or steamed. Stay away from items that are buttered, battered, fried, or served with cream sauce. Items labeled "crispy" are usually fried, unless stated otherwise.  Choose water, low-fat milk, unsweetened iced tea, or other drinks without added sugar. If you want an alcoholic beverage, choose a lower calorie option such as a glass of wine or light beer.  Ask for dressings, sauces, and syrups on the side. These are usually high in calories, so you should limit the amount you eat.  If you want a salad, choose a garden salad and ask for grilled meats. Avoid extra toppings like bacon, cheese, or fried items. Ask for the dressing on the side, or ask for olive oil and vinegar or lemon to use as dressing.  Estimate how many servings of a food you are given. For example, a serving of cooked rice is  cup or about the size of half a baseball. Knowing serving sizes will help you be aware of how much food you are eating at restaurants. The list below tells you how big or small some common portion sizes are based on everyday   objects: ? 1 oz-4 stacked dice. ? 3 oz-1 deck of cards. ? 1 tsp-1 die. ? 1 Tbsp- a ping-pong ball. ? 2 Tbsp-1 ping-pong ball. ?  cup- baseball. ? 1 cup-1 baseball. Summary  Calorie counting means keeping track of how many calories you eat and  drink each day. If you eat fewer calories than your body needs, you should lose weight.  A healthy amount of weight to lose per week is usually 1-2 lb (0.5-0.9 kg). This usually means reducing your daily calorie intake by 500-750 calories.  The number of calories in a food can be found on a Nutrition Facts label. If a food does not have a Nutrition Facts label, try to look up the calories online or ask your dietitian for help.  Use your calories on foods and drinks that will fill you up, and not on foods and drinks that will leave you hungry.  Use smaller plates, glasses, and bowls to prevent overeating. This information is not intended to replace advice given to you by your health care provider. Make sure you discuss any questions you have with your health care provider. Document Released: 11/08/2005 Document Revised: 07/28/2018 Document Reviewed: 10/08/2016 [employer] Interactive Patient Education  2019 [employer]  

## 2019-01-29 NOTE — Progress Notes (Signed)
GYNECOLOGY ANNUAL PREVENTATIVE CARE ENCOUNTER NOTE  History:     Brenda Hicks is a 53 y.o. [phone] female here for a routine annual gynecologic exam.  Current complaints: discharge with odor and irritation .Pt states she has been bleeding on and off since IUD removal.  Denies abnormal , pelvic pain, problems with intercourse or other gynecologic concerns.    Gynecologic History No LMP recorded (lmp unknown). (Menstrual status: IUD). Contraception: condoms Last Pap: 2017 Results were:abnormal per pt Last mammogram: 2019 Results were: normal  Obstetric History OB History  Gravida Para Term Preterm AB Living  7 6 5 1 1 6   SAB TAB Ectopic Multiple Live Births      1        # Outcome Date GA Lbr Len/2nd Weight Sex Delivery Anes PTL Lv  7 Preterm           6 Term           5 Term           4 Term           3 Term           2 Term           1 Ectopic             Past Medical History:  Diagnosis Date  . Anemia    vitamin d deficiency  . Arthritis   . Asthma    WELL CONTROLLED  . 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 Diabetes mellitus without complication ([place])   . Fatty liver   . Hypertension   . Kidney cysts    per patient, never had  . Renal disorder   . Sleep apnea    USES CPAP. waiting for new machine and a new sleep study  . Stroke [facility]) May or June 2019   TIA. no residual symptoms    Past Surgical History:  Procedure Laterality Date  . ANTERIOR CERVICAL DECOMP/DISCECTOMY FUSION N/A 08/09/2018   Procedure: ANTERIOR CERVICAL DECOMPRESSION/DISCECTOMY FUSION 1 LEVEL- C4-5;  Surgeon: Meade Maw, MD;  Location: [facility] ORS;  Service: Neurosurgery;  Laterality: N/A;  . BACK SURGERY    . DILATION AND CURETTAGE OF UTERUS    . ENDOMETRIAL BIOPSY     benign  . EXPLORATORY LAPAROTOMY  1992   REMOVAL OF RUPTURED ECTOPIC  . HAND SURGERY Right 1998   cyst removed  . HERNIA REPAIR  8850   UMBILICAL  . JOINT REPLACEMENT Right 2014   TKR  . KNEE ARTHROSCOPY  Right 2012  . KNEE SURGERY Right 2014   total knee replacement  . SHOULDER ARTHROSCOPY WITH BICEPSTENOTOMY Left 12/14/2016   Procedure: SHOULDER ARTHROSCOPY WITH BICEPSTENOTOMY;  Surgeon: Corky Mull, MD;  Location: [facility] ORS;  Service: Orthopedics;  Laterality: Left;  . SHOULDER ARTHROSCOPY WITH OPEN ROTATOR CUFF REPAIR Left 12/14/2016   Procedure: SHOULDER ARTHROSCOPY WITH OPEN ROTATOR CUFF REPAIR AND ARTHROSCOPIC ROTATOR CUFF REPAIR;  Surgeon: Corky Mull, MD;  Location: [facility] ORS;  Service: Orthopedics;  Laterality: Left;  . SHOULDER ARTHROSCOPY WITH ROTATOR CUFF REPAIR Right 01/04/2019   Procedure: SHOULDER ARTHROSCOPY WITH ROTATOR CUFF REPAIR;  Surgeon: Corky Mull, MD;  Location: [facility] ORS;  Service: Orthopedics;  Laterality: Right;  . SHOULDER ARTHROSCOPY WITH SUBACROMIAL DECOMPRESSION Left 12/14/2016   Procedure: SHOULDER ARTHROSCOPY WITH SUBACROMIAL DECOMPRESSION;  Surgeon: Corky Mull, MD;  Location: [facility] OR[facility];  Service: Orthopedics;  Laterality: Left;  . SHOULDER  ARTHROSCOPY WITH SUBACROMIAL DECOMPRESSION AND BICEP TENDON REPAIR Right 01/04/2019   Procedure: SHOULDER ARTHROSCOPY WITH DEBRIDEMENT AND SUBACROMIAL DECOMPRESSION-RIGHT;  Surgeon: Corky Mull, MD;  Location: [facility] OR[facility];  Service: Orthopedics;  Laterality: Right;  . TUBAL LIGATION      Current Outpatient Medications on File Prior to Visit  Medication Sig Dispense Refill  . albuterol (PROVENTIL HFA;VENTOLIN HFA) 108 (90 Base) MCG/ACT inhaler Inhale 2 puffs into the lungs every 6 (six) hours as needed for wheezing or shortness of breath.    Marland Kitchen aspirin EC 81 MG tablet Take 81 mg by mouth daily.    Water engineer Bandages & Supports (MEDICAL COMPRESSION STOCKINGS) MISC 2 medium stockings Use as directed on both lower legs 2 each 0  . empagliflozin (JARDIANCE) 25 MG TABS tablet Take 25 mg by mouth daily.    . Exenatide ER (BYDUREON) 2 MG PEN Inject 2 mg into the skin every Sunday.     . gabapentin (NEURONTIN) 300 MG capsule Take 300 mg  by mouth 3 (three) times daily as needed (for neuropathy pain).     . insulin glargine (LANTUS) 100 UNIT/ML injection Inject 0.2 mLs (20 Units total) into the skin daily. (Patient taking differently: Inject 100 Units into the skin at bedtime. ) 10 mL 11  . insulin lispro (HUMALOG) 100 UNIT/ML injection Inject 20 Units into the skin 3 (three) times daily before meals.    Marland Kitchen lisinopril (PRINIVIL,ZESTRIL) 40 MG tablet Take 40 mg by mouth every evening.     . naproxen sodium (ALEVE) 220 MG tablet Take 220 mg by mouth daily as needed.    Marland Kitchen oxyCODONE (OXY IR/ROXICODONE) 5 MG immediate release tablet Take 1-2 tablets (5-10 mg total) by mouth every 4 (four) hours as needed for moderate pain ((score 4 to 6)). 50 tablet 0  . promethazine (PHENERGAN) 25 MG tablet Take 1 tablet (25 mg total) by mouth every 6 (six) hours as needed for nausea or vomiting. 20 tablet 0  . QUEtiapine (SEROQUEL) 50 MG tablet Take 50 mg by mouth at bedtime.    Marland Kitchen tiZANidine (ZANAFLEX) 4 MG tablet Take 4 mg by mouth 2 (two) times daily.    . traZODone (DESYREL) 50 MG tablet Take 50 mg by mouth at bedtime as needed for sleep.   5  . Vitamin D, Ergocalciferol, (DRISDOL) 1.25 MG (50000 UT) CAPS capsule Take 50,000 Units by mouth every Monday.     No current facility-administered medications on file prior to visit.     Allergies  Allergen Reactions  . Morphine And Related Nausea And Vomiting  . Tramadol     Nausea. If she takes antinausea medicine with this medicine, then she can tolerate it.    Social History:  reports that she quit smoking about 19 months ago. Her smoking use included cigarettes. She has a 50.00 pack-year smoking history. She has never used smokeless tobacco. She reports that she does not drink alcohol or use drugs.   She exercise 1-2 day with a walk for 20 min. Stopped smoking 2 yrs ago. Denies drug or alcohol.  She is married .   Family History  Problem Relation Age of Onset  . CAD Maternal Grandmother      The following portions of the patient's history were reviewed and updated as appropriate: allergies, current medications, past family history, past medical history, past social history, past surgical history and problem list.  Review of Systems Pertinent items noted in HPI and remainder of comprehensive ROS otherwise negative.  Physical Exam:  BP (!) 135/92   Pulse 79   Ht 5\' 5"  (1.651 m)   Wt (!) 344 lb 8 oz (156.3 kg)   LMP  (LMP Unknown)   BMI 57.33 kg/m  CONSTITUTIONAL: Well-developed, well-nourished, morbidly obese female in no acute distress.  HENT:  Normocephalic, atraumatic, External right and left ear normal. Oropharynx is clear and moist EYES: Conjunctivae and EOM are normal. Pupils are equal, round, and reactive to light. No scleral icterus.  NECK: Normal range of motion, supple, no masses.  Normal thyroid.  SKIN: Skin is warm and dry. No rash noted. Not diaphoretic. No erythema. No pallor. MUSCULOSKELETAL: Normal range of motion. No tenderness.  No cyanosis, clubbing, or edema.  2+ distal pulses. NEUROLOGIC: Alert and oriented to person, place, and time. Normal reflexes, muscle tone coordination. No cranial nerve deficit noted. PSYCHIATRIC: Normal mood and affect. Normal behavior. Normal judgment and thought content. CARDIOVASCULAR: Normal heart rate noted, regular rhythm RESPIRATORY: Clear to auscultation bilaterally. Effort and breath sounds normal, no problems with respiration noted. BREASTS: Symmetric in size. No masses, skin changes, nipple drainage, or lymphadenopathy. ABDOMEN: Soft, normal bowel sounds, no distention noted.  No tenderness, rebound or guarding. Exam compromised due to body habitus  PELVIC: Normal appearing external genitalia; normal appearing vaginal mucosa and cervix.  No abnormal discharge noted.  Pap smear obtained.  Normal uterine size, no other palpable masses, no uterine or adnexal tenderness. Exam compromise due to body habitus.    Assessment  and Plan:    1. Women's annual routine gynecological examination   Will follow up results of pap smear and manage accordingly. Mammogram scheduled Labs: lipid done 2019. TSH done 2019, glucose monitored by endocrinology,  Colonoscopy completed 6 yrs ago per pt: normal result.  Routine preventative health maintenance measures emphasized. Please refer to After Visit Summary for other counseling recommendations.      Philip Aspen, CNM

## 2019-01-30 LAB — CERVICOVAGINAL ANCILLARY ONLY
Bacterial vaginitis: NEGATIVE
CANDIDA VAGINITIS: NEGATIVE
Chlamydia: NEGATIVE
Neisseria Gonorrhea: NEGATIVE
Trichomonas: NEGATIVE

## 2019-01-31 LAB — CYTOLOGY - PAP
Diagnosis: NEGATIVE
HPV: NOT DETECTED

## 2019-04-02 ENCOUNTER — Other Ambulatory Visit: Payer: Self-pay | Admitting: Surgery

## 2019-04-02 DIAGNOSIS — M4722 Other spondylosis with radiculopathy, cervical region: Secondary | ICD-10-CM

## 2019-04-03 ENCOUNTER — Ambulatory Visit
Admission: RE | Admit: 2019-04-03 | Discharge: 2019-04-03 | Disposition: A | Discharge status: Home / Self Care | Payer: Medicaid Other | Source: Ambulatory Visit | Attending: Surgery | Admitting: Surgery

## 2019-04-03 ENCOUNTER — Other Ambulatory Visit: Payer: Self-pay

## 2019-04-03 DIAGNOSIS — M4722 Other spondylosis with radiculopathy, cervical region: Secondary | ICD-10-CM | POA: Insufficient documentation

## 2019-04-03 IMAGING — CR DG KNEE COMPLETE 4+V*L*
1 series · 4 of 4 positions shown · non-contrast
Comparison: None.

CLINICAL DATA: Knee pain

EXAM:
LEFT KNEE - COMPLETE 4+ VIEW

[Series 1: dg knee complete 4 views left · 0.14mm/px · 4 of 4 slices shown]
[im 1/4]
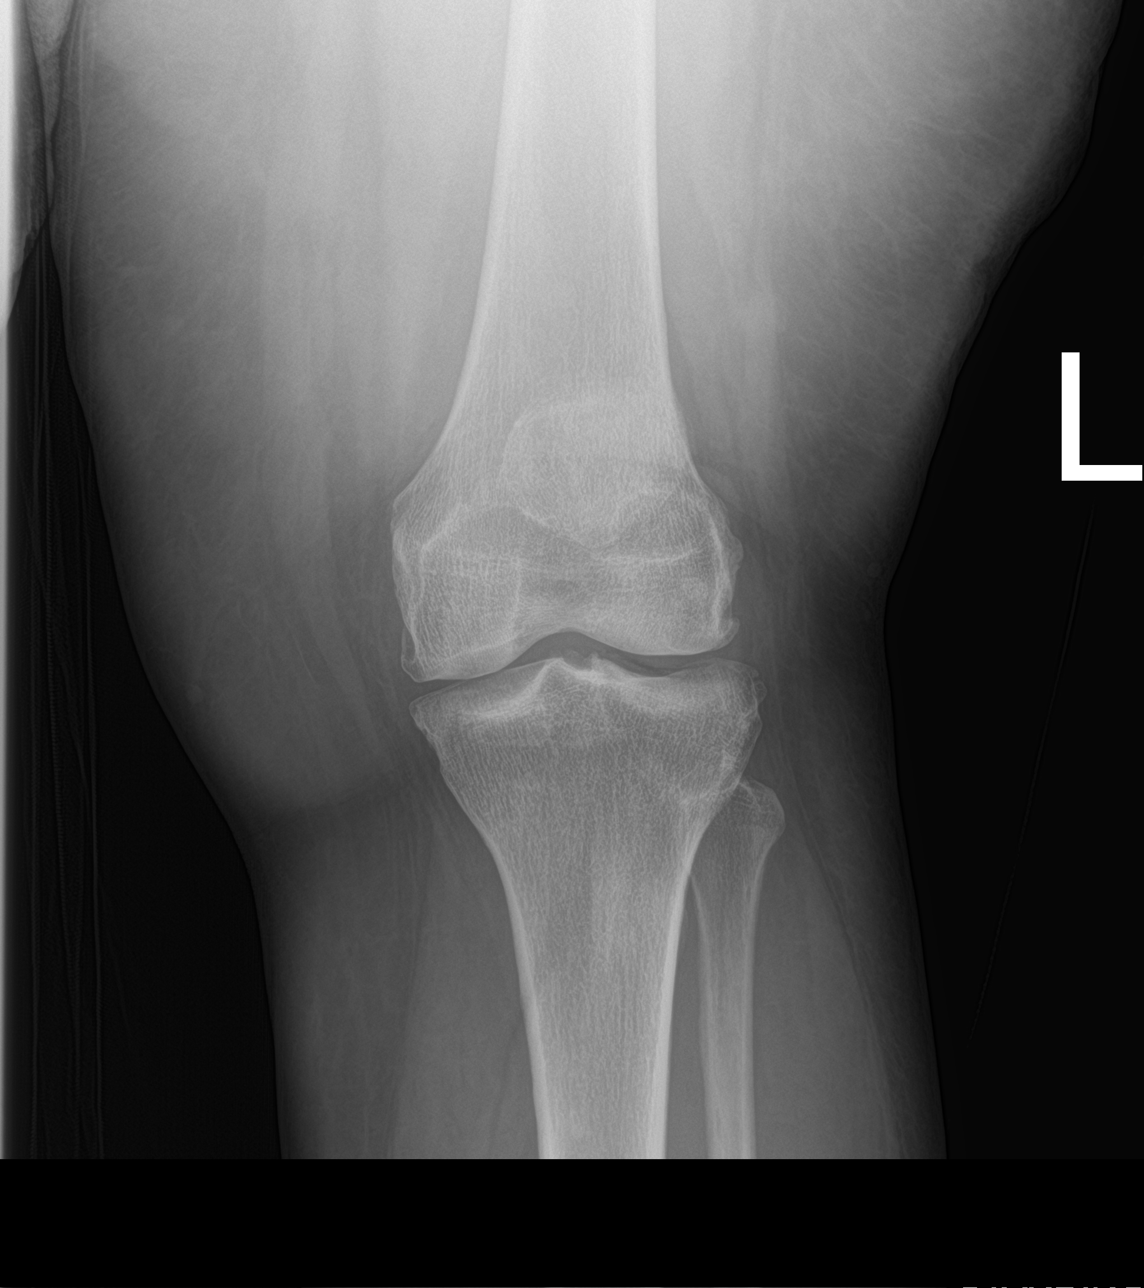
[im 2/4]
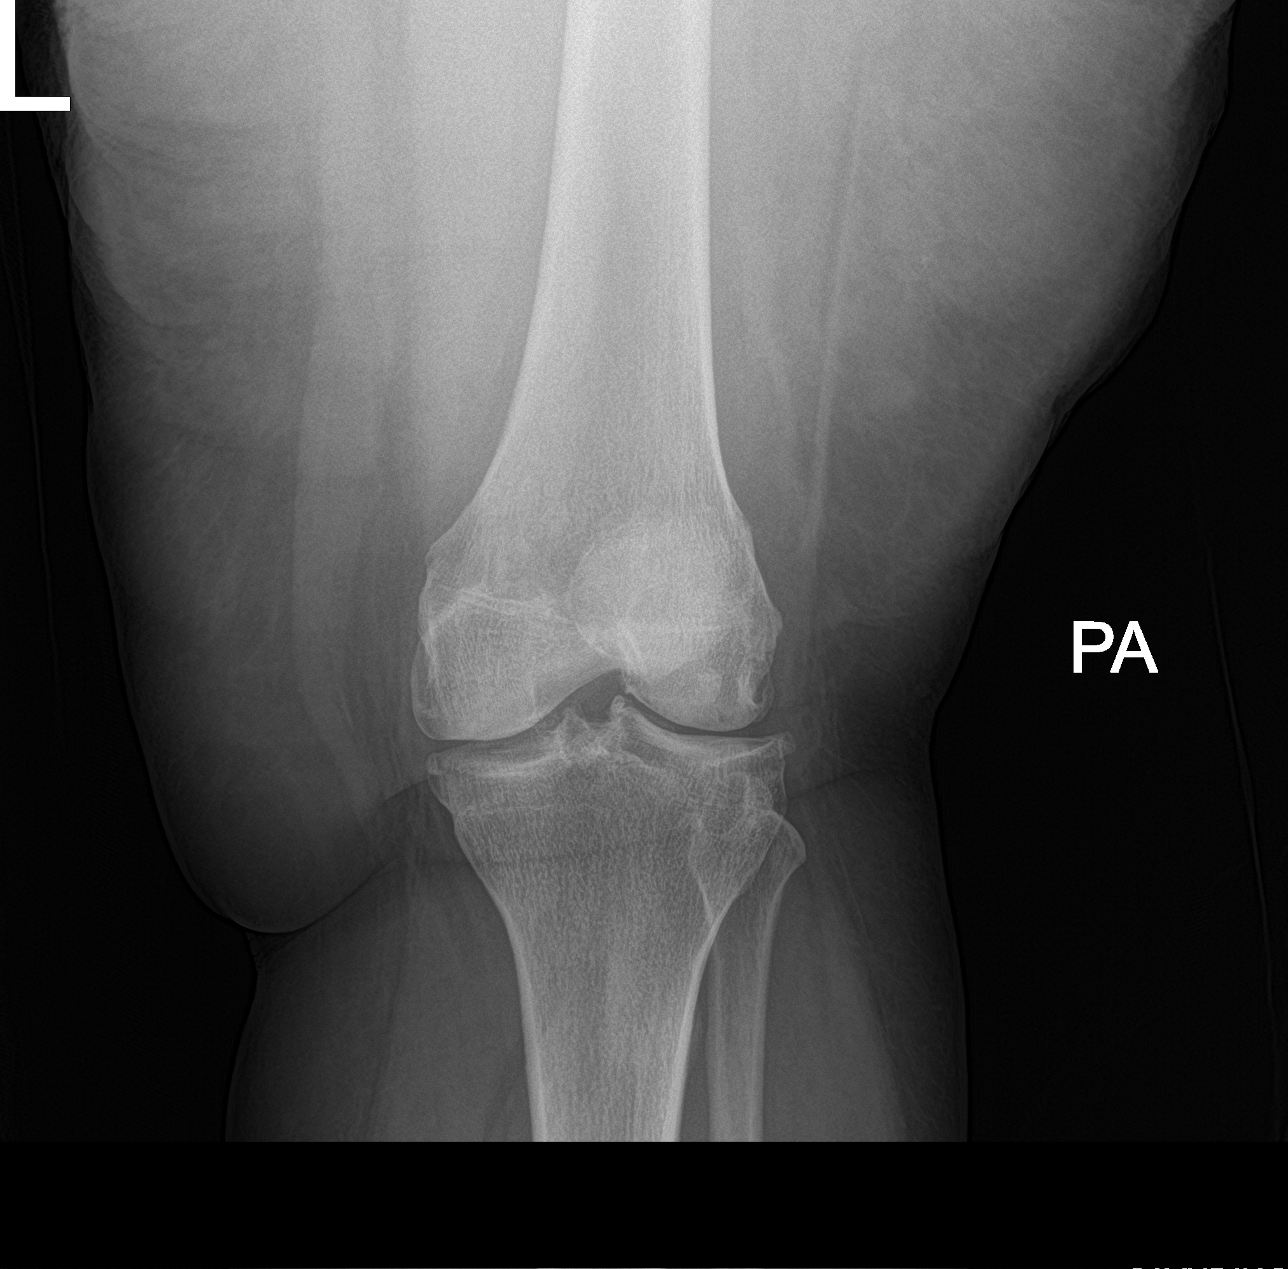
[im 3/4]
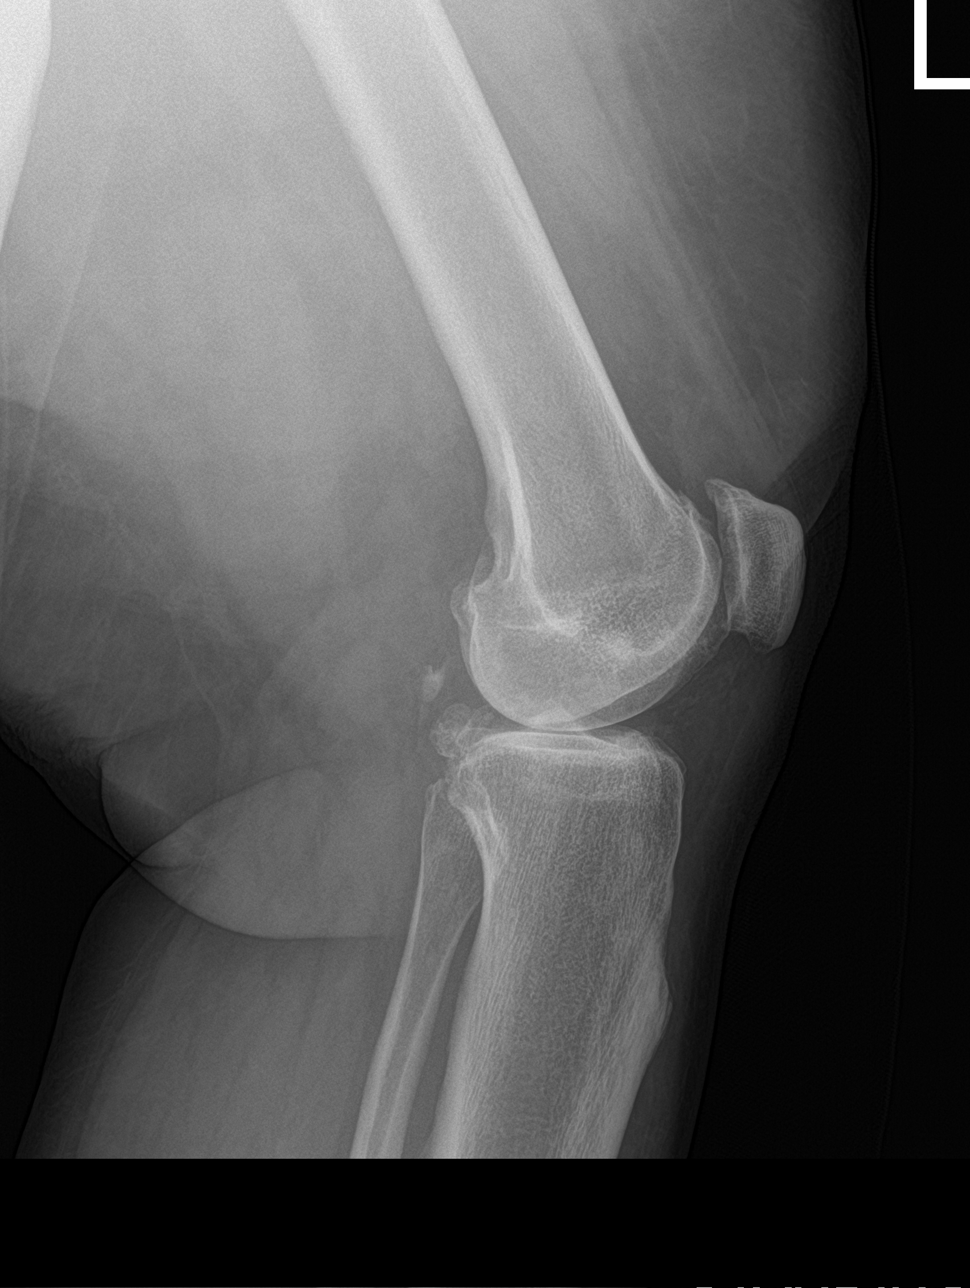
[im 4/4]
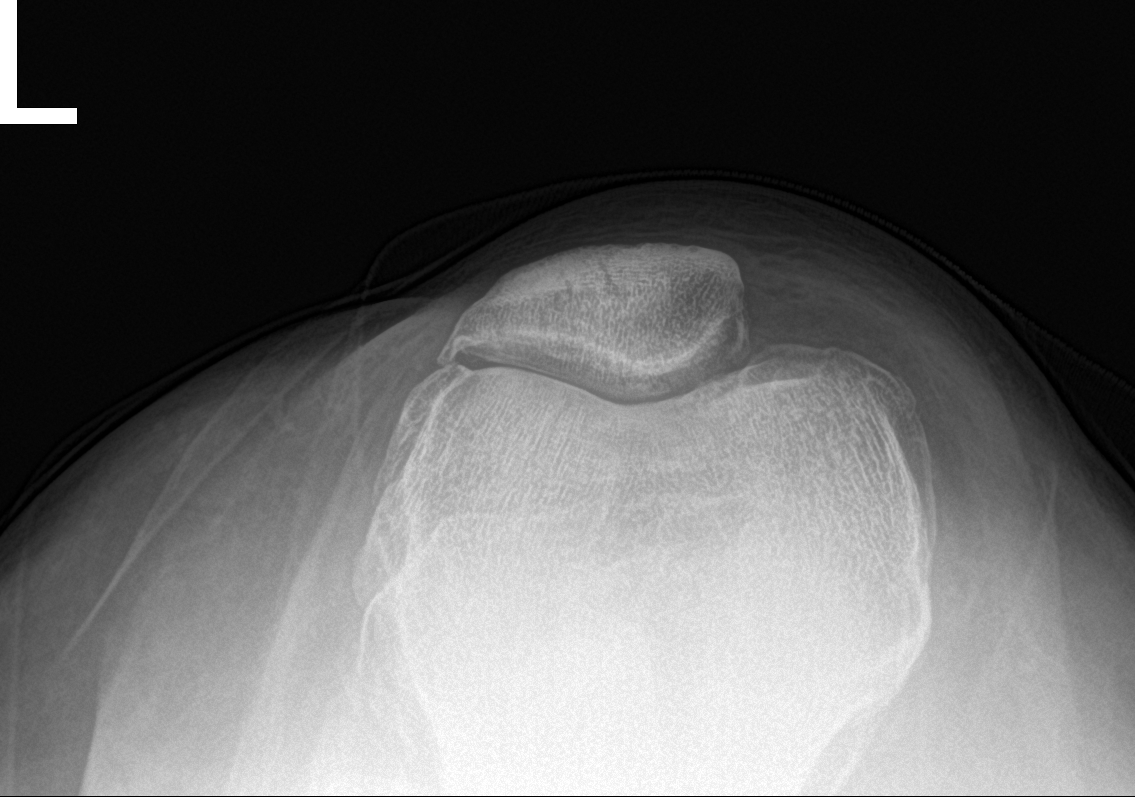

[4 of 4 positions shown; findings below may reference images not displayed]

FINDINGS: Mild osteophyte formation at the margins of the femorotibial joint
space. No fracture or dislocation. Prepatellar soft tissue swelling
without joint effusion.
IMPRESSION: Mild left knee degenerative change without acute abnormality.

## 2019-04-16 IMAGING — MR MR SHOULDER*R* W/O CM
5 series · 40 of 40 positions shown · non-contrast
Comparison: None.

CLINICAL DATA: Right shoulder pain and decreased range of motion
since November 2017.

EXAM:
MRI OF THE RIGHT SHOULDER WITHOUT CONTRAST
TECHNIQUE: Multiplanar, multisequence MR imaging of the shoulder was performed.
No intravenous contrast was administered.

[Series 3: T2 fat-sat · axial · 4.0mm · 0.59mm/px · z∈[-21,+62]mm · 8 of 20 slices shown (1 of 3)]
[im 1/20]
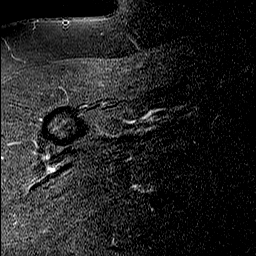
[im 3/20]
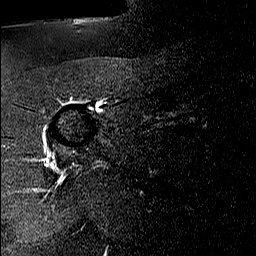
[im 6/20]
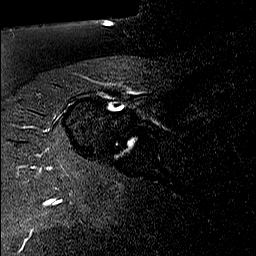
[im 9/20]
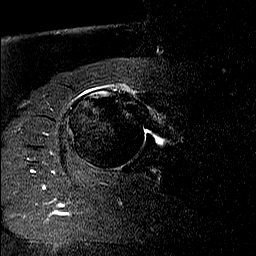
[im 11/20]
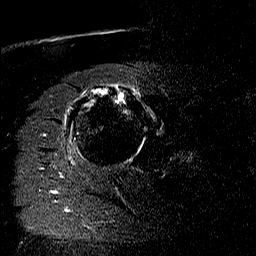
[im 14/20]
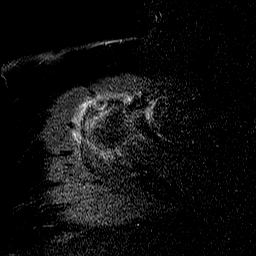
[im 17/20]
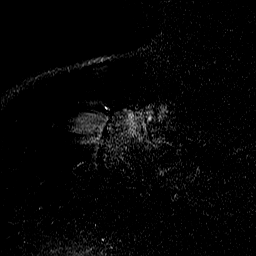
[im 20/20]
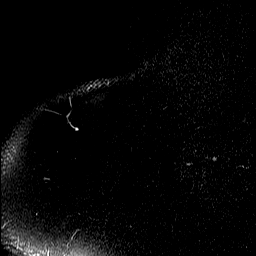

[Series 4: T2 fat-sat · coronal · 4.0mm · 0.59mm/px · 7 of 17 slices shown (2 of 3)]
[im 1/17]
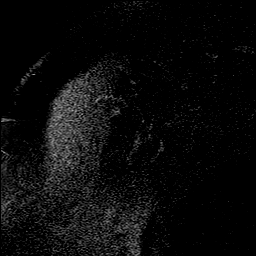
[im 3/17]
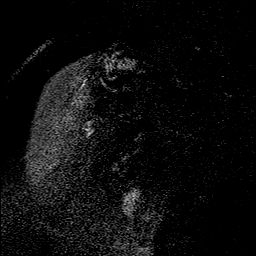
[im 6/17]
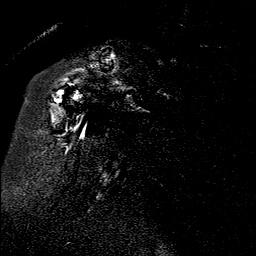
[im 9/17]
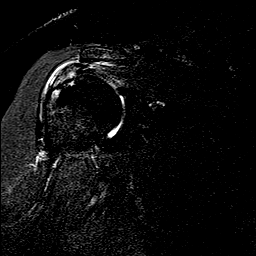
[im 11/17]
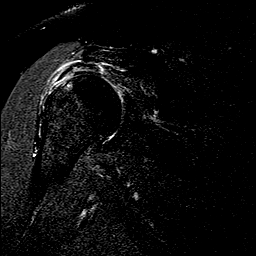
[im 14/17]
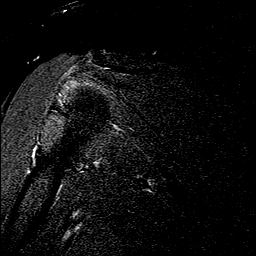
[im 17/17]
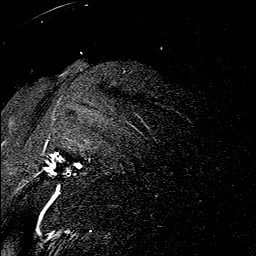

[Series 5: PD · coronal · 4.0mm · 0.59mm/px · 7 of 17 slices shown]
[im 1/17]
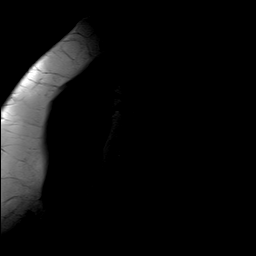
[im 3/17]
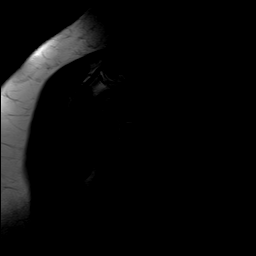
[im 6/17]
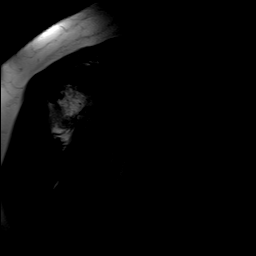
[im 9/17]
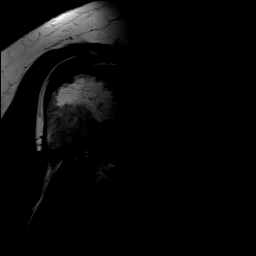
[im 11/17]
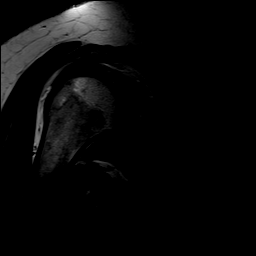
[im 14/17]
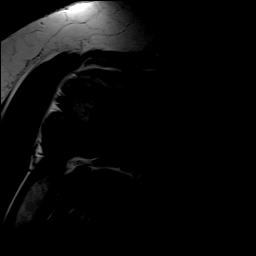
[im 17/17]
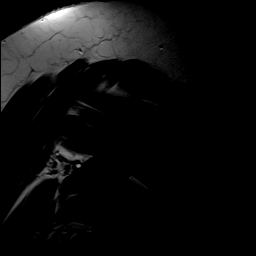

[Series 6: T1 · oblique · 4.0mm · 0.59mm/px · 9 of 23 slices shown]
[im 1/23]
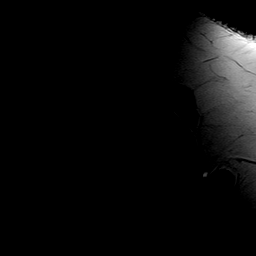
[im 3/23]
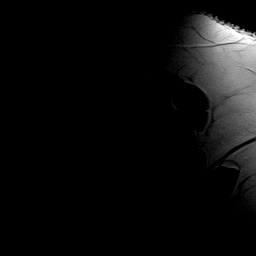
[im 6/23]
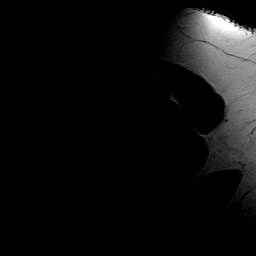
[im 9/23]
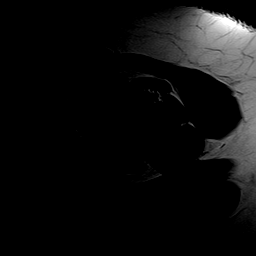
[im 12/23]
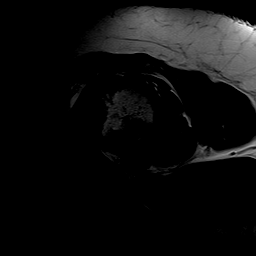
[im 14/23]
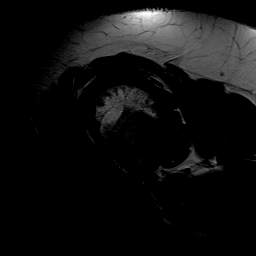
[im 17/23]
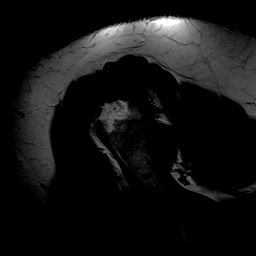
[im 20/23]
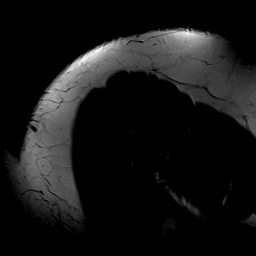
[im 23/23]
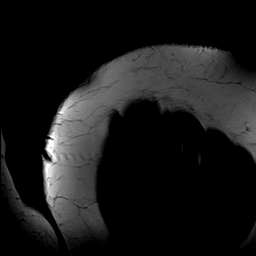

[Series 7: T2 fat-sat · oblique · 4.0mm · 0.59mm/px · 9 of 23 slices shown (3 of 3)]
[im 1/23]
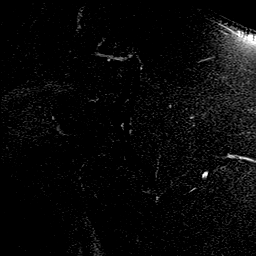
[im 3/23]
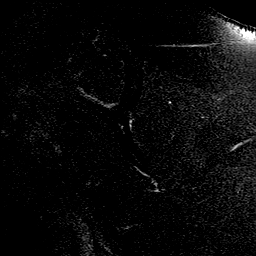
[im 6/23]
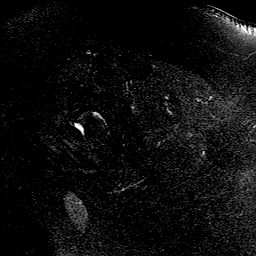
[im 9/23]
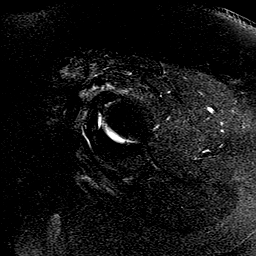
[im 12/23]
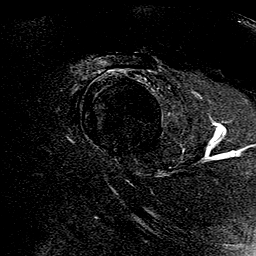
[im 14/23]
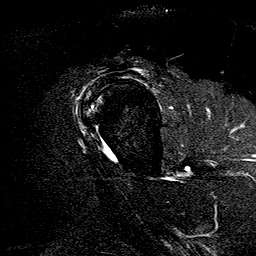
[im 17/23]
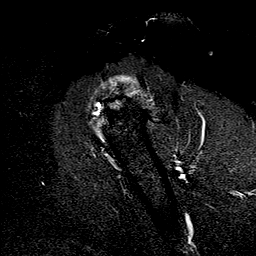
[im 20/23]
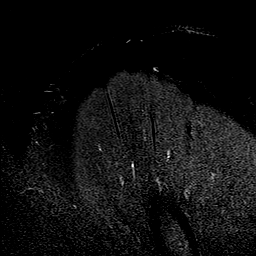
[im 23/23]
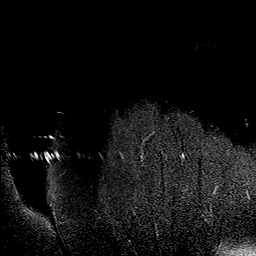

[40 of 40 positions shown; findings below may reference images not displayed]

FINDINGS: Rotator cuff: No discrete tears of the rotator cuff. Focal cystic
degenerative changes in the anterior aspect of the greater
tuberosity adjacent to the distal supraspinatus insertion. I do not
think the signal abnormalities are within the tendon itself.

Muscles: No atrophy or abnormal signal of the muscles of the rotator
cuff.

Biceps long head:  Properly located and intact.

Acromioclavicular Joint: Slight AC joint arthropathy. Type 2
acromion. Subtle inflammation of the subdeltoid bursa.

Glenohumeral Joint: No joint effusion. No chondral defect.

Labrum:  Intact.

Bones: Focal cystic degenerative changes in the greater tuberosity
of the proximal humerus. Otherwise negative.

Other: None
IMPRESSION: 1. Slight inflammation of the subdeltoid bursa.
2. Cystic degenerative changes in the greater tuberosity at the
supraspinatus insertion.
3. No discrete rotator cuff tear.
4. Slight AC joint arthropathy.

## 2019-04-16 IMAGING — MR MR SHOULDER*L* W/O CM
5 series · 40 of 40 positions shown · non-contrast
Comparison: MRIs dated 04/12/2017 and 11/11/2016

CLINICAL DATA: Left shoulder pain for 2 years. She fell in September 2017. Previous rotator cuff repair. Previous biceps tenotomy.

EXAM:
MRI OF THE LEFT SHOULDER WITHOUT CONTRAST
TECHNIQUE: Multiplanar, multisequence MR imaging of the shoulder was performed.
No intravenous contrast was administered.

[Series 3: T2 fat-sat · axial · 4.0mm · 0.59mm/px · z∈[-31,+52]mm · 8 of 20 slices shown (1 of 3)]
[im 1/20]
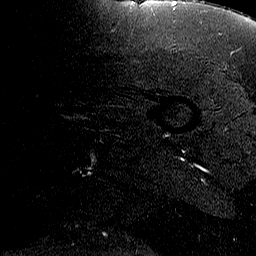
[im 3/20]
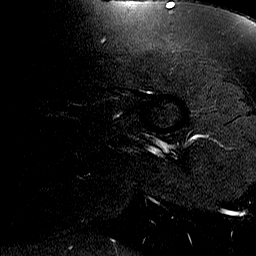
[im 6/20]
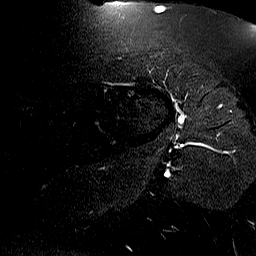
[im 9/20]
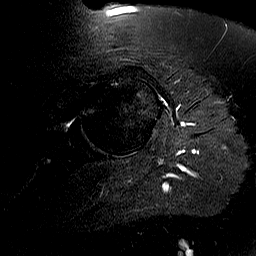
[im 11/20]
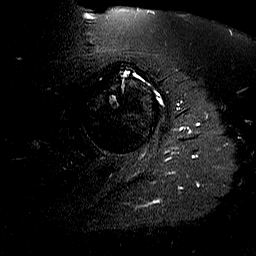
[im 14/20]
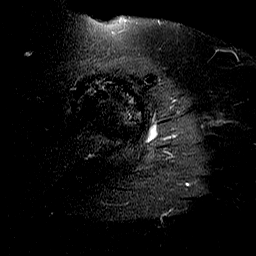
[im 17/20]
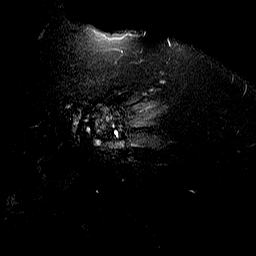
[im 20/20]
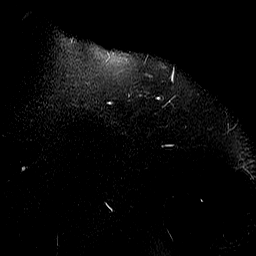

[Series 4: T2 fat-sat · oblique · 4.0mm · 0.59mm/px · 7 of 17 slices shown (2 of 3)]
[im 1/17]
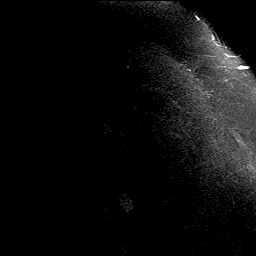
[im 3/17]
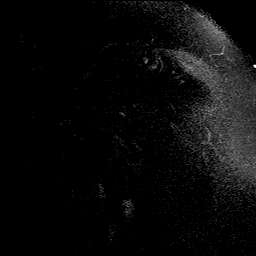
[im 6/17]
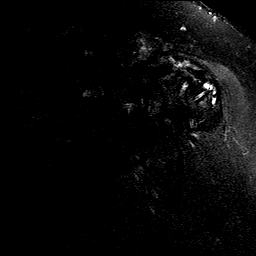
[im 9/17]
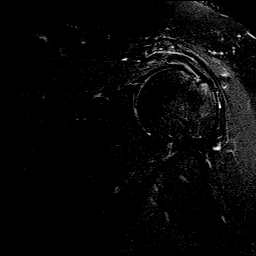
[im 11/17]
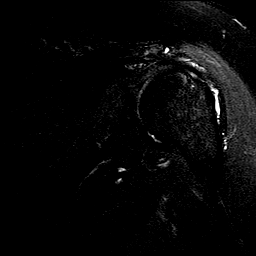
[im 14/17]
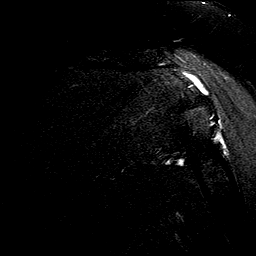
[im 17/17]
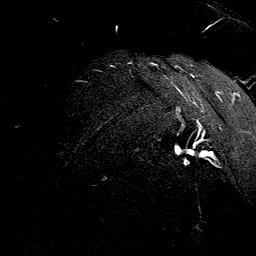

[Series 5: PD · oblique · 4.0mm · 0.59mm/px · 7 of 17 slices shown]
[im 1/17]
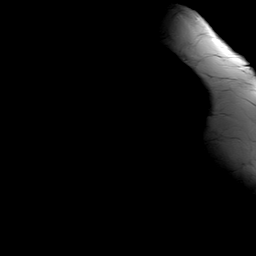
[im 3/17]
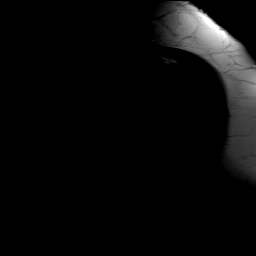
[im 6/17]
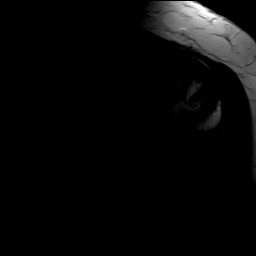
[im 9/17]
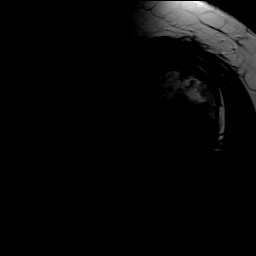
[im 11/17]
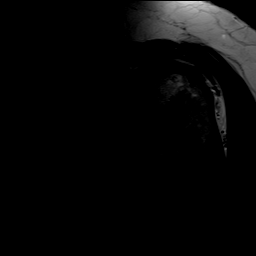
[im 14/17]
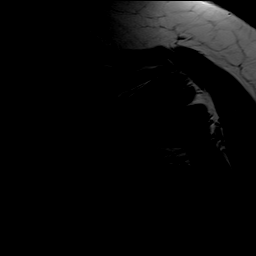
[im 17/17]
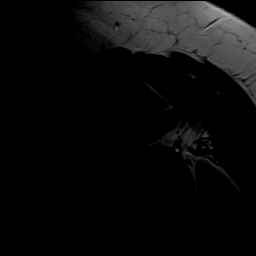

[Series 6: T1 · oblique · 4.0mm · 0.59mm/px · 9 of 23 slices shown]
[im 1/23]
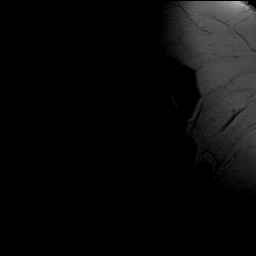
[im 3/23]
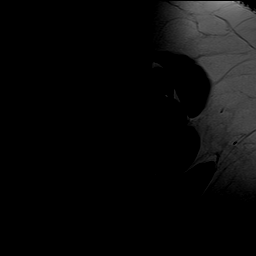
[im 6/23]
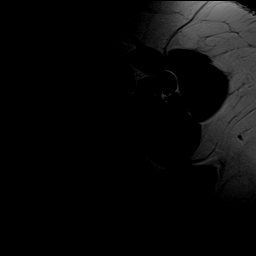
[im 9/23]
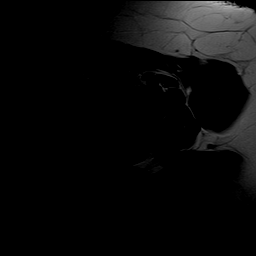
[im 12/23]
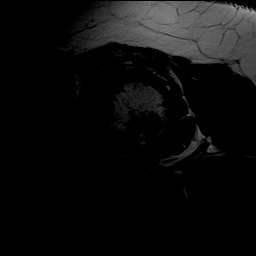
[im 14/23]
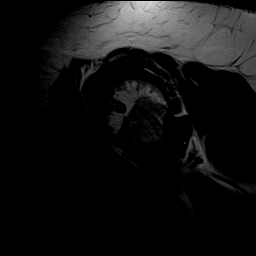
[im 17/23]
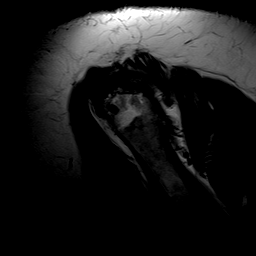
[im 20/23]
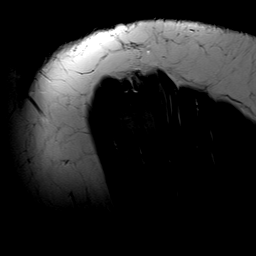
[im 23/23]
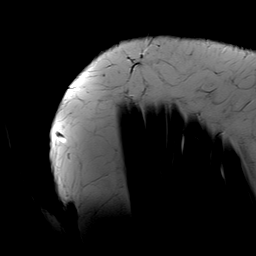

[Series 7: T2 fat-sat · oblique · 4.0mm · 0.59mm/px · 9 of 23 slices shown (3 of 3)]
[im 1/23]
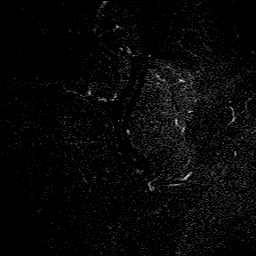
[im 3/23]
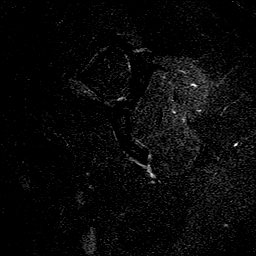
[im 6/23]
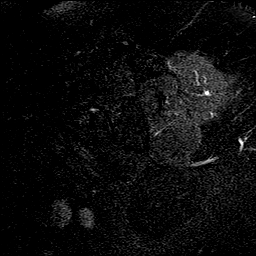
[im 9/23]
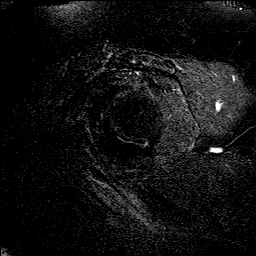
[im 12/23]
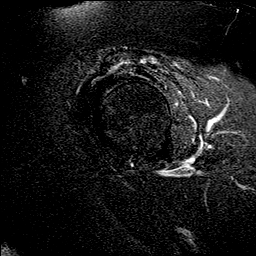
[im 14/23]
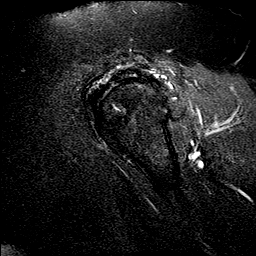
[im 17/23]
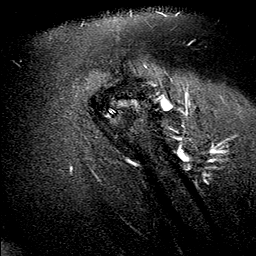
[im 20/23]
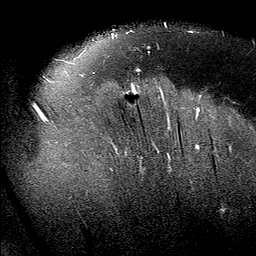
[im 23/23]
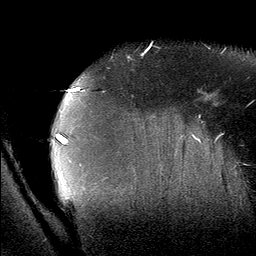

[40 of 40 positions shown; findings below may reference images not displayed]

FINDINGS: Rotator cuff: The repaired rotator cuff is intact. Chronic
tendinopathy of the articular surface of the distal supraspinatus
tendon, less prominent than on the prior exams.

Muscles: No atrophy or abnormal signal of the muscles of the rotator
cuff.

Biceps long head:  Not visualized.  Previous biceps tenotomy.

Acromioclavicular Joint: Moderate AC joint arthropathy. Complete
resolution of edema in the acromion and distal clavicle as well as
resolution of the edema around the AC joint since the prior study.
Type 3 acromion, best seen on image 12 of series 6. Almost complete
resolution of the fluid subdeltoid bursa with only a tiny amount
fluid seen in the posterior aspect of the bursa.

Glenohumeral Joint: No joint effusion. No chondral defect.

Labrum:  Intact.

Bones: Anchors in the humeral head from prior rotator cuff repair.
Otherwise negative.

Other: None
IMPRESSION: 1. No evidence of recurrent rotator cuff tear. The partial-thickness
bursal surface tear described on the prior study is not apparent to
me on this exam.
2. Resolution of inflammation in and around the acromioclavicular
joint since the prior study.
3. Almost complete resolution of bursal fluid since the prior study.
4. No new abnormalities.

## 2019-04-30 IMAGING — CT CT ANGIO HEAD
2 of 11 series · 7 of 33 positions shown · IV contrast (APPLIED)
Comparison: MRI of the head March 31, 2018, CTA HEAD and neck March 30, 2018.

CLINICAL DATA: Neck pain for a year, worsening today after physical
therapy. History of skin cancer, stroke, diabetes, hypertension.

EXAM:
CT ANGIOGRAPHY HEAD AND NECK
TECHNIQUE: Multidetector CT imaging of the head and neck was performed using
the standard protocol during bolus administration of intravenous
contrast. Multiplanar CT image reconstructions and MIPs were
obtained to evaluate the vascular anatomy. Carotid stenosis
measurements (when applicable) are obtained utilizing NASCET
criteria, using the distal internal carotid diameter as the
denominator.
CONTRAST:  75mL L2X2CO-O9T IOPAMIDOL (L2X2CO-O9T) INJECTION 76%

[Series 11: cta head neck · axial · 0.43mm/px · z∈[+448,+556]mm · 2 of 164 slices shown]
[im 55/164  soft-tissue]
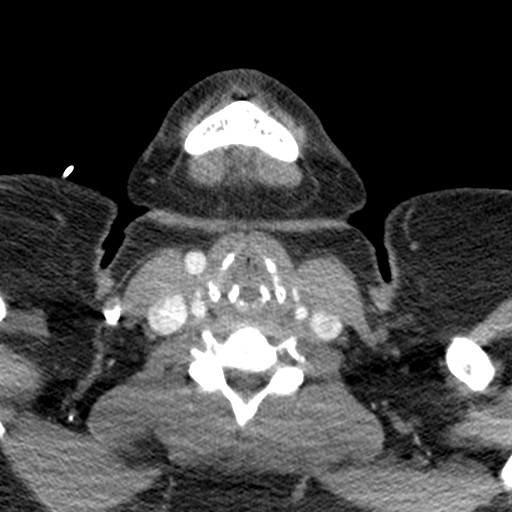
[im 109/164  soft-tissue]
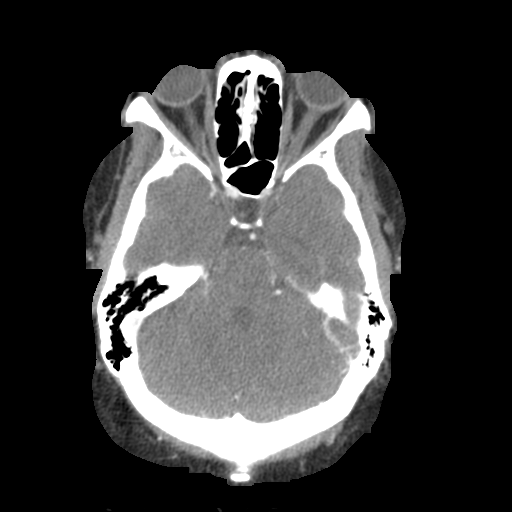

[Series 13: ax thin · axial · 0.39mm/px · z∈[+397,+608]mm · 5 of 317 slices shown]
[im 53/317  soft-tissue]
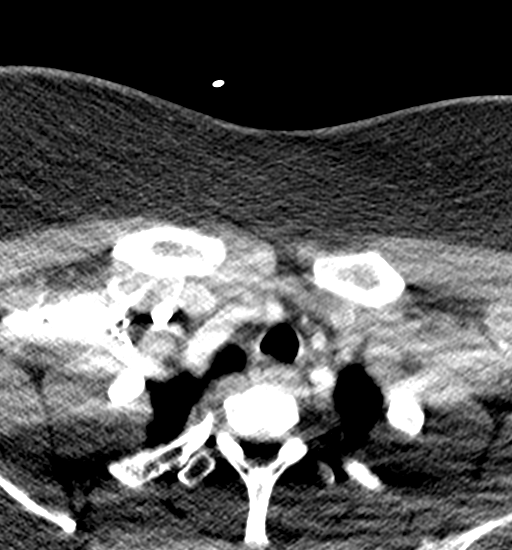
[im 106/317  bone]
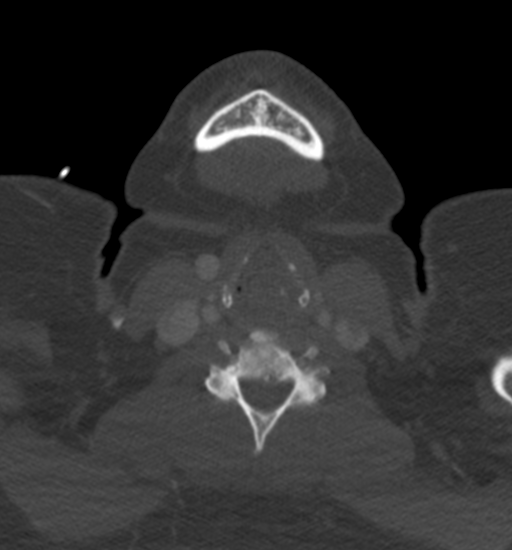
[im 159/317  soft-tissue]
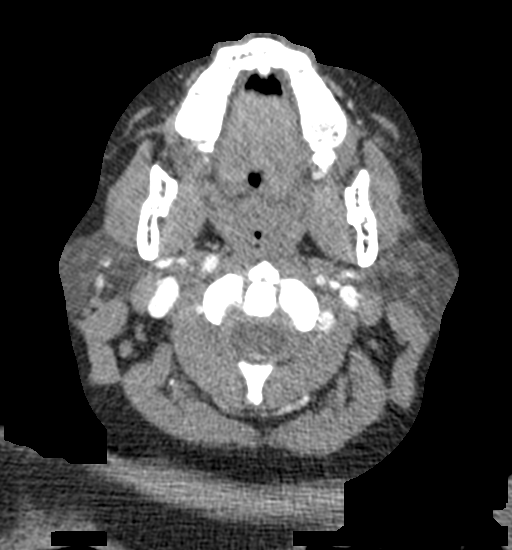
[im 211/317  bone]
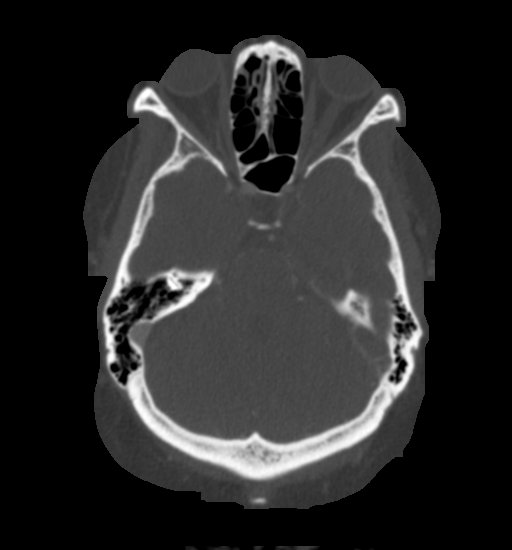
[im 264/317  soft-tissue]
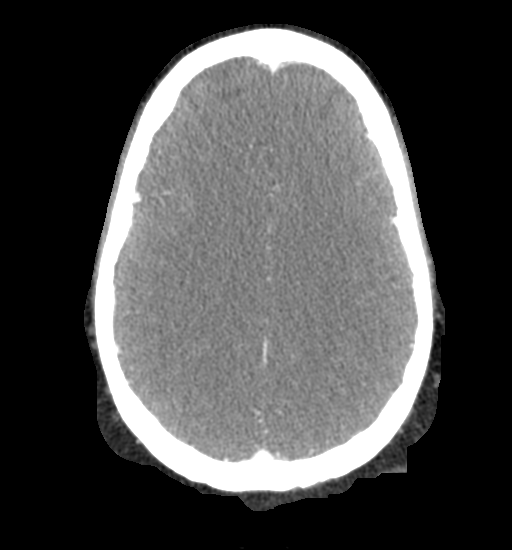

[7 of 33 positions shown; findings below may reference images not displayed]

FINDINGS: CT HEAD FINDINGS

BRAIN: No intraparenchymal hemorrhage, mass effect nor midline
shift. The ventricles and sulci are normal. No acute large vascular
territory infarcts. No abnormal extra-axial fluid collections. Basal
cisterns are patent.

VASCULAR: Unremarkable.

SKULL/SOFT TISSUES: No skull fracture. Partially empty sella. No
significant soft tissue swelling.

ORBITS/SINUSES: The included ocular globes and orbital contents are
normal.Trace paranasal sinus mucosal thickening. Mastoid air cells
are well aerated.

OTHER: None.

CTA NECK FINDINGS:

Large body habitus results in overall noisy image quality.

AORTIC ARCH: Normal appearance of the thoracic arch, 2 vessel arch
is a normal variant. The origins of the innominate, left Common
carotid artery and subclavian artery are widely patent.

RIGHT CAROTID SYSTEM: Common carotid artery is patent. Normal
appearance of the carotid bifurcation without hemodynamically
significant stenosis by NASCET criteria. Normal appearance of the
internal carotid artery.

LEFT CAROTID SYSTEM: Common carotid artery is patent. Normal
appearance of the carotid bifurcation without hemodynamically
significant stenosis by NASCET criteria. Normal appearance of the
internal carotid artery.

VERTEBRAL ARTERIES:Patent vertebral arteries, limited assessment of
luminal irregularity due to noisy image quality. Intermittent loss
of demonstrable contrast enhancement bilateral mid V2 segments,
segment unchanged.

SKELETON: No acute osseous process though bone windows have not been
submitted. Minimal calcific tendinopathy longus coli insertion.
Moderate LEFT C4-5 neural foraminal narrowing.

OTHER NECK: Soft tissues of the neck are nonacute though, not
tailored for evaluation.

UPPER CHEST: Included lung apices are clear. No superior mediastinal
lymphadenopathy.

CTA HEAD FINDINGS:

ANTERIOR CIRCULATION: Patent cervical internal carotid arteries,
petrous, cavernous and supra clinoid internal carotid arteries.
Patent anterior communicating artery. Patent anterior and middle
cerebral arteries.

No large vessel occlusion, significant stenosis, contrast
extravasation or aneurysm.

POSTERIOR CIRCULATION: Patent vertebral arteries, vertebrobasilar
junction and basilar artery, as well as main branch vessels. Patent
posterior cerebral arteries. Robust RIGHT and small LEFT posterior
communicating arteries present.

No large vessel occlusion, significant stenosis, contrast
extravasation or aneurysm.

VENOUS SINUSES: Major dural venous sinuses are patent though not
tailored for evaluation on this angiographic examination.

ANATOMIC VARIANTS: Hypoplastic LEFT A1 segment. Bilateral A2
segments predominately arise from RIGHT A1-2 junction.

DELAYED PHASE: No abnormal intracranial enhancement.

MIP images reviewed.
IMPRESSION: CT HEAD:

1. Negative CT HEAD with and without contrast.
2. Partially empty sella.

CTA NECK:

1. Similar poor characterization of vertebral arteries with poor
contrast opacification V2 segment, potential chronic vertebral
artery dissections without occlusion.
2. No hemodynamically significant stenosis ICA's.

CTA HEAD:

1. No emergent large vessel occlusion or flow-limiting stenosis.
Complete circle-of-Willis.

## 2019-04-30 IMAGING — DX DG CHEST 1V PORT
1 series · 1 of 1 positions shown · non-contrast
Comparison: 03/29/2018

CLINICAL DATA: Left-sided chest pain

EXAM:
PORTABLE CHEST 1 VIEW

[chest ap]
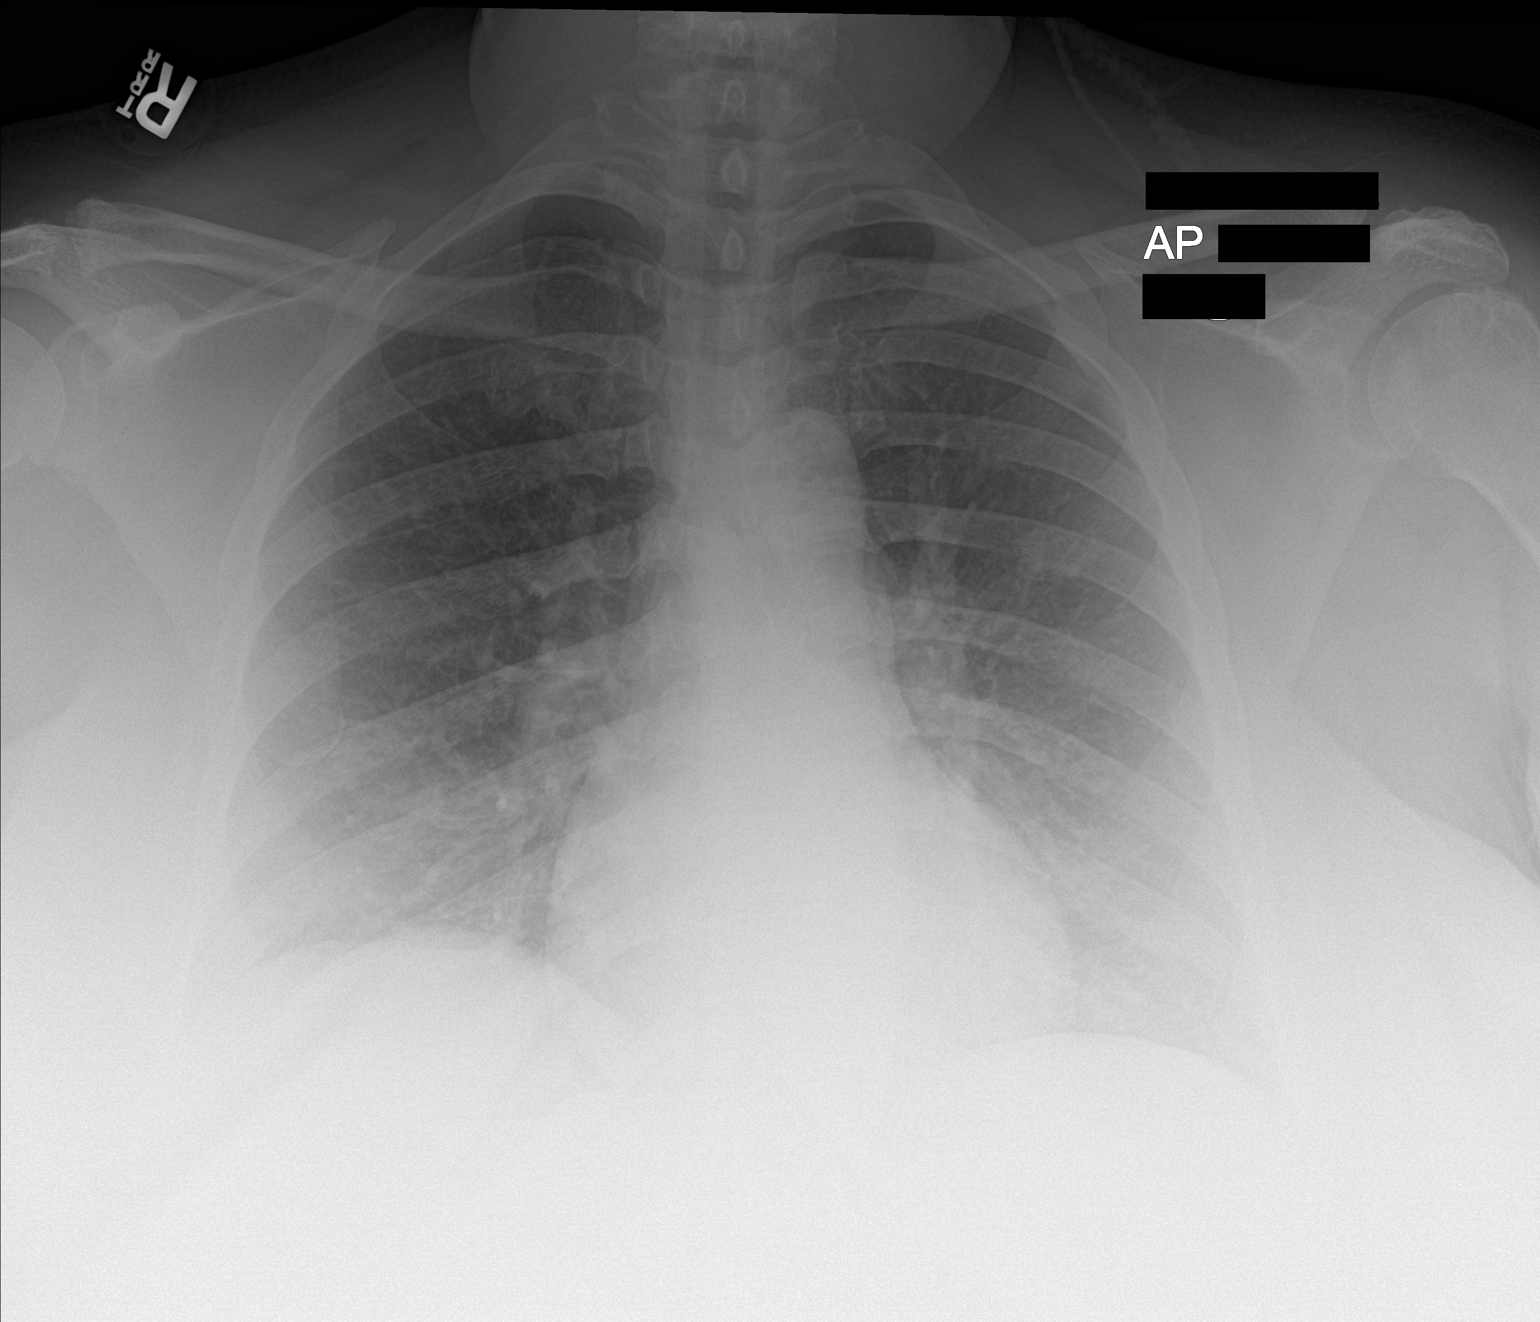

[1 of 1 positions shown; findings below may reference images not displayed]

FINDINGS: The heart size and mediastinal contours are within normal limits.
Both lungs are clear. The visualized skeletal structures are
unremarkable.
IMPRESSION: No active disease.

## 2019-05-01 IMAGING — MR MR CERVICAL SPINE WO/W CM
5 of 8 series · 28 of 48 positions shown · IV contrast (MULTIHANCE)
Comparison: CTA head and neck 07/18/2018.

CLINICAL DATA: Neck pain, chronic, normal neuro exam, negative
x-ray. Possible chronic vertebral artery dissections without acute
abnormality or change in based on CT angiogram performed yesterday.
Focal stenosis or occlusion and reconstitution of the vertebral
arteries at C4-5 bilaterally.

EXAM:
MRI CERVICAL SPINE WITHOUT AND WITH CONTRAST
TECHNIQUE: Multiplanar and multiecho pulse sequences of the cervical spine, to
include the craniocervical junction and cervicothoracic junction,
were obtained without and with intravenous contrast.
CONTRAST:  20mL MULTIHANCE GADOBENATE DIMEGLUMINE 529 MG/ML IV SOLN

[Series 5: T2 · sagittal · 3.0mm · 0.69mm/px · 4 of 17 slices shown (1 of 2)]
[im 1/17]
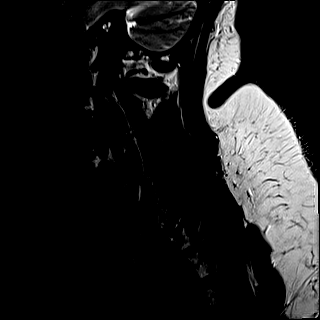
[im 6/17]
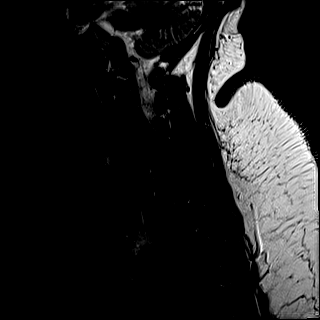
[im 11/17]
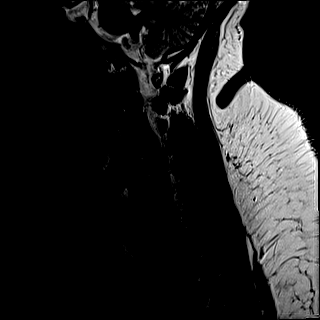
[im 17/17]
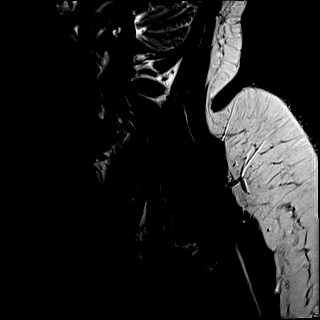

[Series 7: STIR · sagittal · 3.0mm · 0.69mm/px · 4 of 17 slices shown]
[im 1/17]
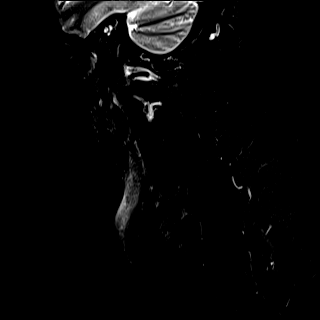
[im 6/17]
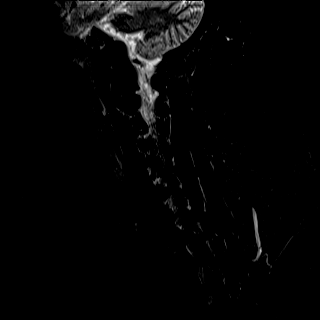
[im 11/17]
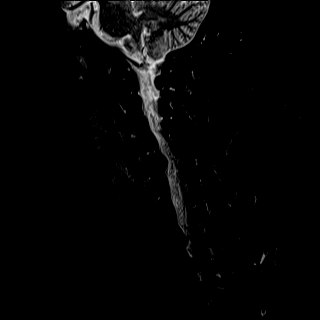
[im 17/17]
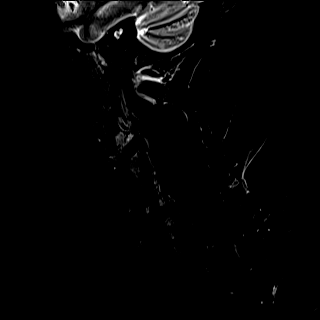

[Series 8: T2 · axial · 3.0mm · 0.70mm/px · z∈[-33,+61]mm · 8 of 29 slices shown (2 of 2)]
[im 1/29]
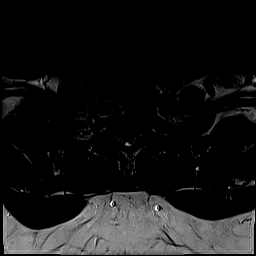
[im 5/29]
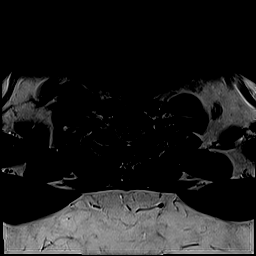
[im 9/29]
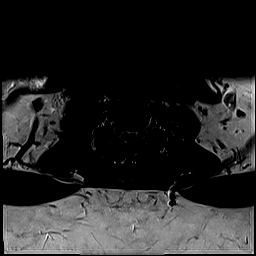
[im 13/29]
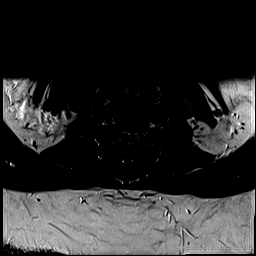
[im 17/29]
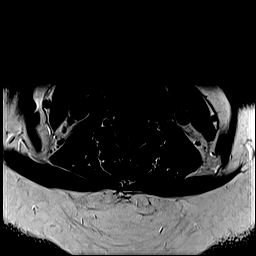
[im 21/29]
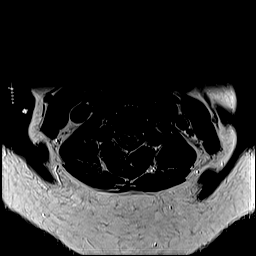
[im 25/29]
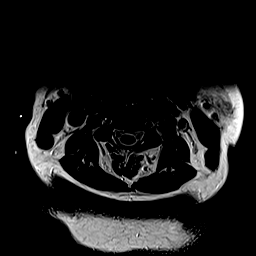
[im 29/29]
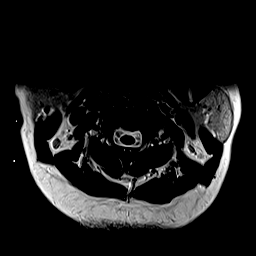

[Series 10: T1 · axial · non-contrast · 3.0mm · 0.35mm/px · z∈[-33,+61]mm · 8 of 29 slices shown]
[im 1/29]
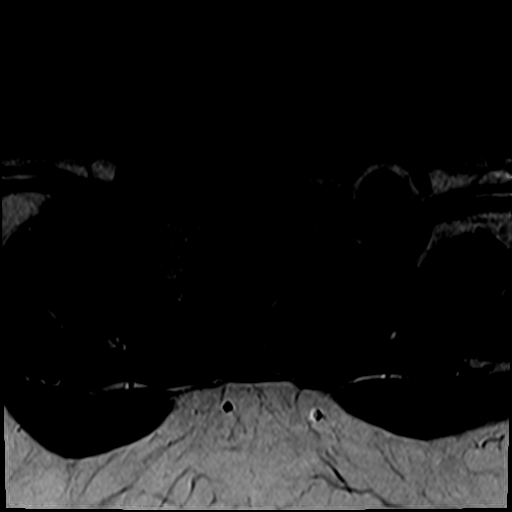
[im 5/29]
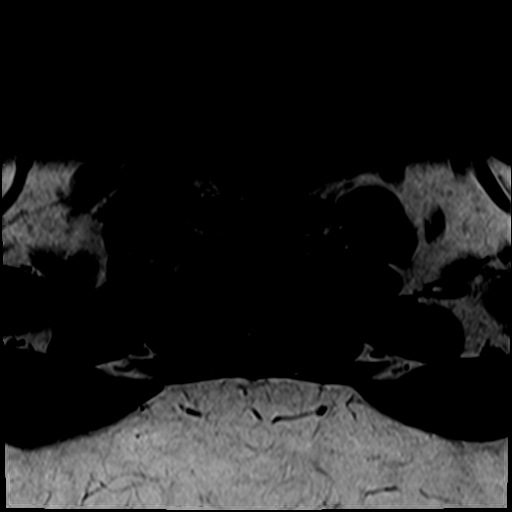
[im 9/29]
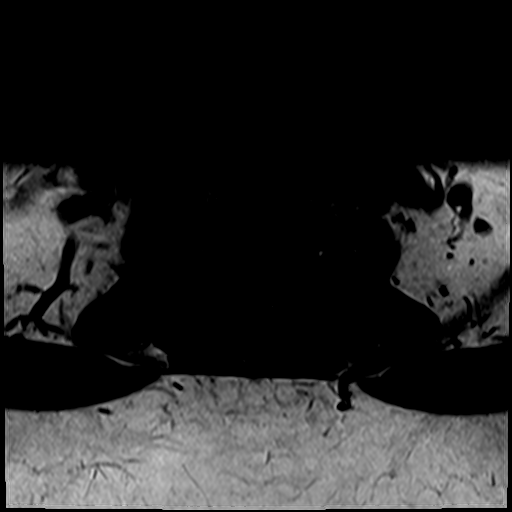
[im 13/29]
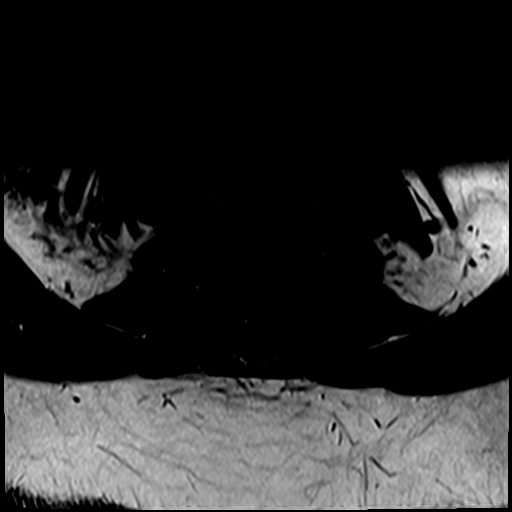
[im 17/29]
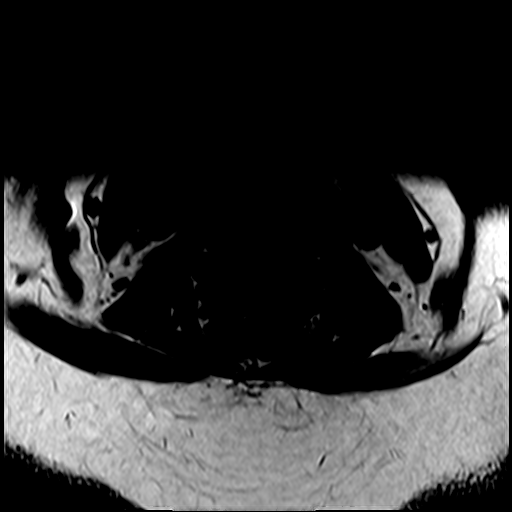
[im 21/29]
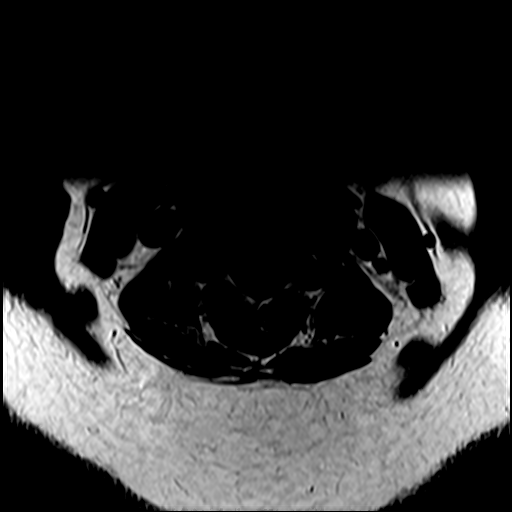
[im 25/29]
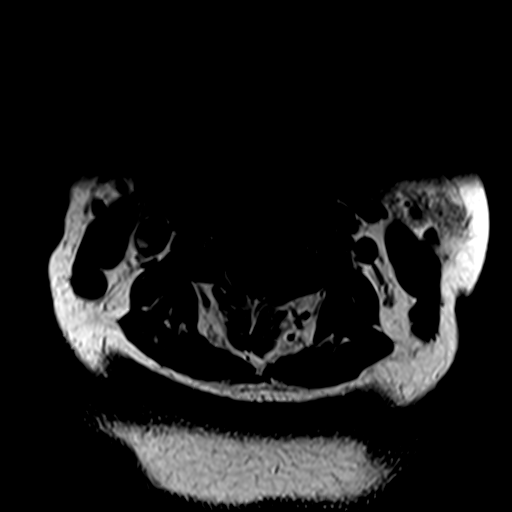
[im 29/29]
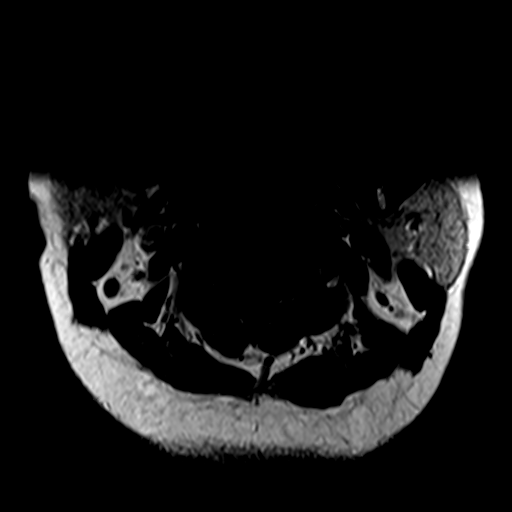

[Series 12: T1 post-contrast · axial · 3.0mm · 0.35mm/px · z∈[-33,+7]mm · 4 of 29 slices shown]
[im 1/29]
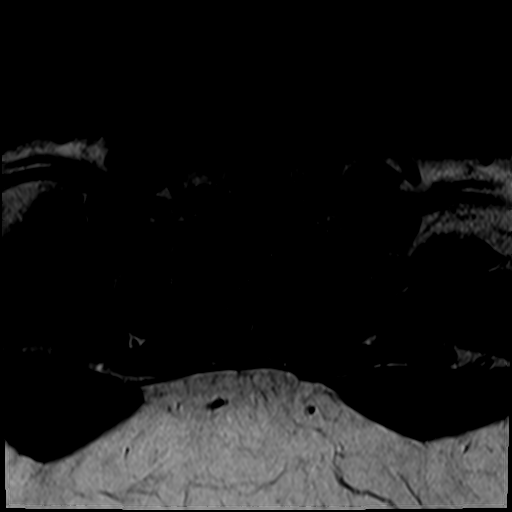
[im 5/29]
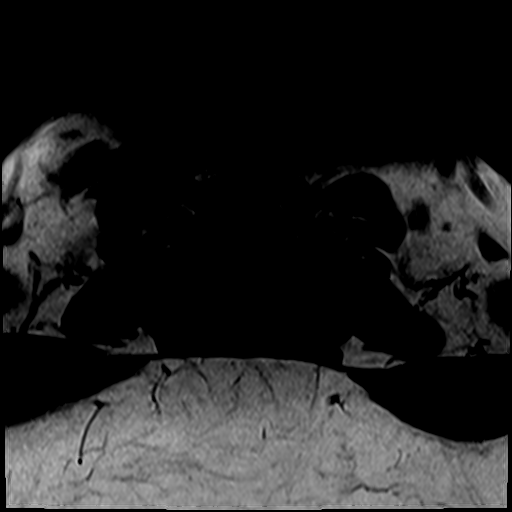
[im 9/29]
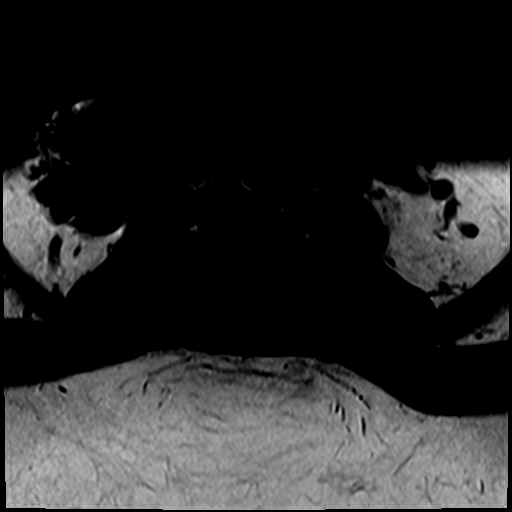
[im 13/29]
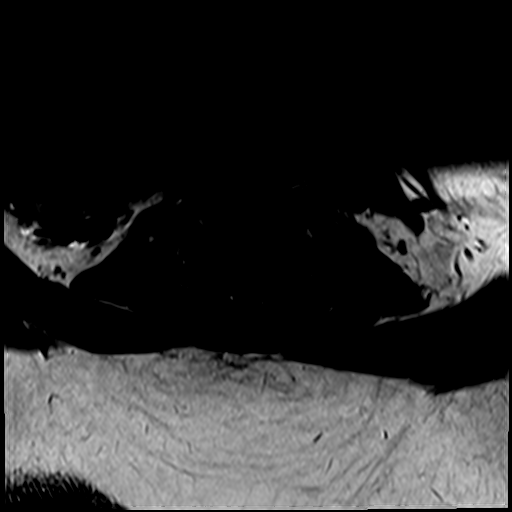

[28 of 48 positions shown; findings below may reference images not displayed]

FINDINGS: Alignment: There is straightening of the normal cervical lordosis.
AP alignment is anatomic.

Vertebrae: Marrow signal and vertebral body heights are normal. No
pathologic enhancement is present.

Cord: Focal right paramedian ventral cord signal T2 hyperintensity
is present at C4-5. Normal signal is present through the remainder
of the cervical and upper thoracic spinal cord.

Posterior Fossa, vertebral arteries, paraspinal tissues: The
craniocervical junction is normal. Visualized intracranial contents
are normal. Flow is present in the vertebral arteries bilaterally.

Disc levels:

C2-3: Negative.

C3-4: Mild uncovertebral and facet hypertrophy is present
bilaterally. This results in mild bilateral foraminal narrowing.
Central canal is patent.

C4-5: A leftward disc osteophyte complex is present. There is
effacement and some distortion of the ventral surface the cord on
the left. Severe left and moderate right foraminal stenosis is
present.

C5-6: Uncovertebral spurring is present bilaterally. Facet
hypertrophy contributes to mild bilateral foraminal narrowing.

C6-7: Negative.

C7-T1: Negative.
IMPRESSION: 1. Cervical spondylosis is most severe at C4-5 with a leftward disc
osteophyte complex resulting in moderate left central canal stenosis
with severe left and moderate right foraminal narrowing.
2. Focal ventral T2 cord signal abnormality at the C4-5 level.
3. Mild foraminal narrowing bilaterally at C3-4 and C5-6 due to
uncovertebral and facet disease.

## 2019-05-17 IMAGING — MR MR MRA NECK WO/W CM
3 of 4 series · 30 of 48 positions shown · IV contrast (MULTIHANCE)
Comparison: CTA head neck 07/18/2018

CLINICAL DATA: Dissection of vertebral artery

EXAM:
MRA NECK WITHOUT AND WITH CONTRAST
TECHNIQUE: Multiplanar and multiecho pulse sequences of the neck were obtained
without and with intravenous contrast. Angiographic images of the
neck were obtained using MRA technique without and with intravenous
contrast.
CONTRAST:  20mL MULTIHANCE GADOBENATE DIMEGLUMINE 529 MG/ML IV SOLN

[Series 9: t1_se_fs_tra_blood-suppr. · axial · B · 2.5mm · 0.62mm/px · z∈[-94,+95]mm · 8 of 70 slices shown]
[im 1/70]
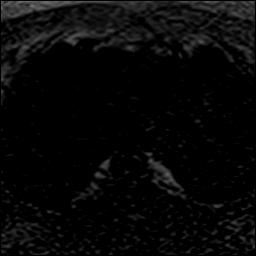
[im 10/70]
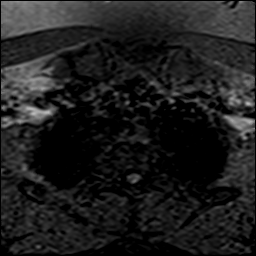
[im 20/70]
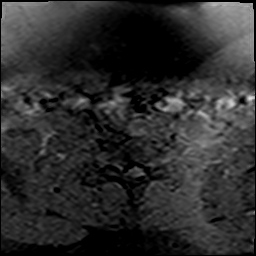
[im 30/70]
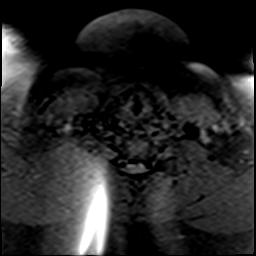
[im 40/70]
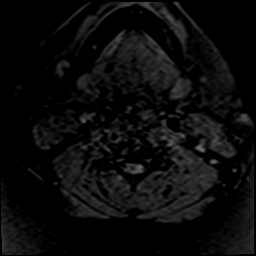
[im 50/70]
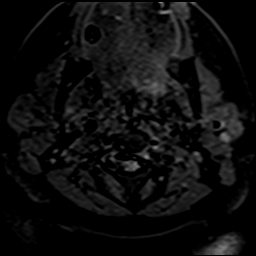
[im 60/70]
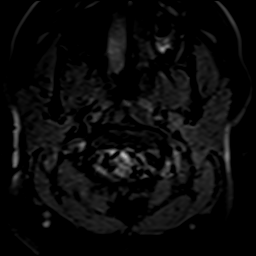
[im 70/70]
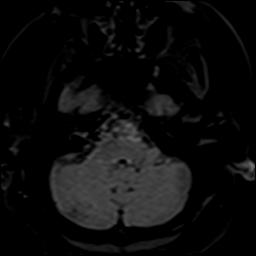

[Series 13: angio_fl3d_cor_post_ttc=2.0s_moco-adv · coronal · B · 0.9mm · 0.85mm/px · 11 of 96 slices shown]
[im 1/96]
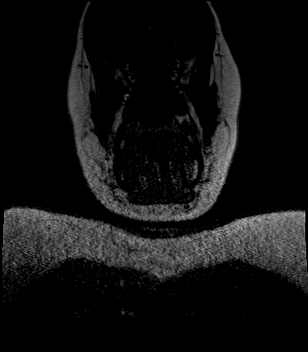
[im 10/96]
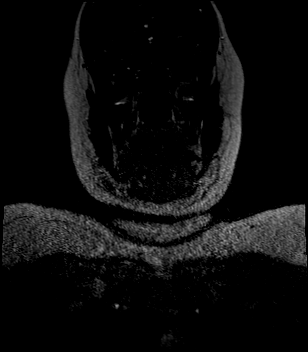
[im 20/96]
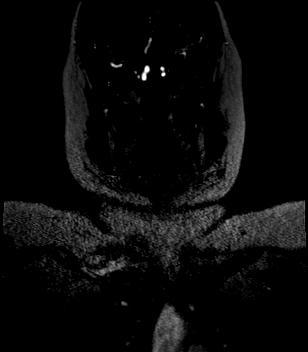
[im 29/96]
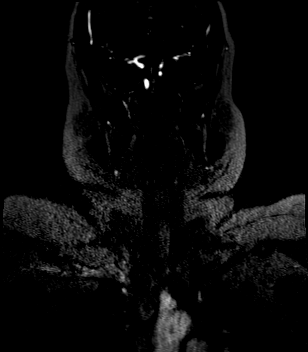
[im 39/96]
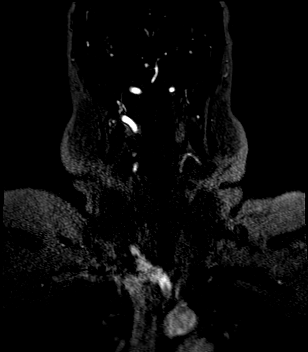
[im 48/96]
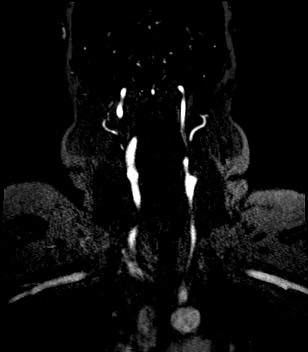
[im 58/96]
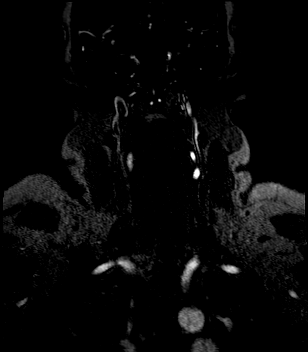
[im 67/96]
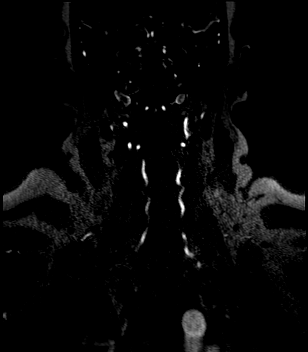
[im 77/96]
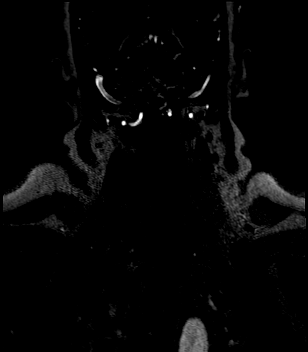
[im 86/96]
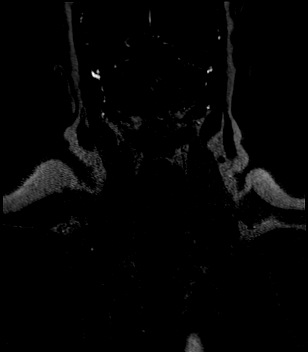
[im 96/96]
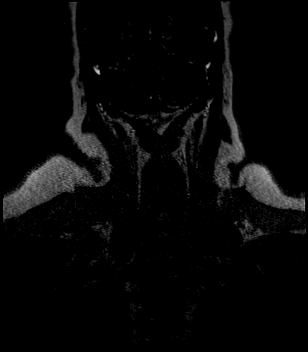

[Series 14: angio_fl3d_cor_post_ttc=2.0s_moco-adv_sub · coronal · B · 0.9mm · 0.85mm/px · 11 of 93 slices shown]
[im 1/93]
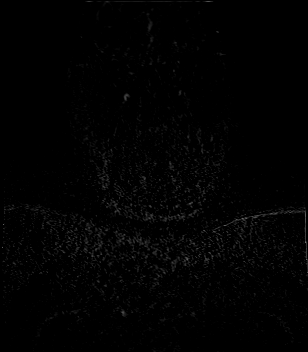
[im 10/93]
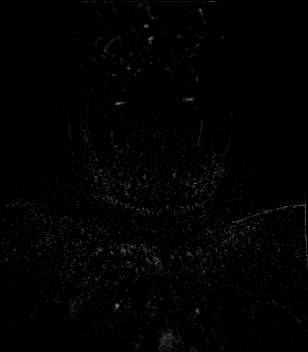
[im 19/93]
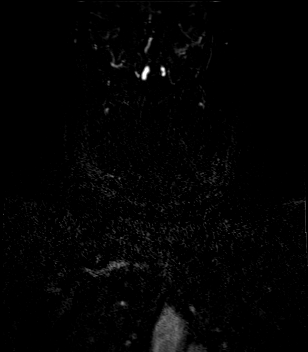
[im 28/93]
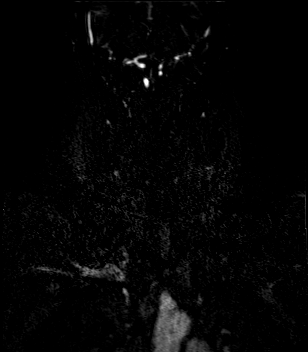
[im 37/93]
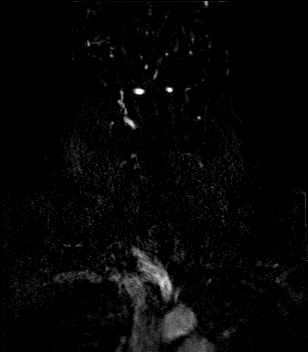
[im 47/93]
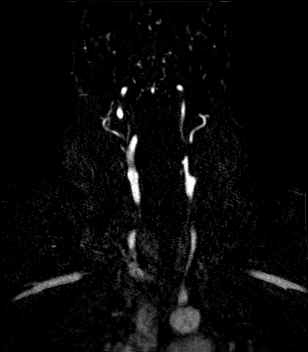
[im 56/93]
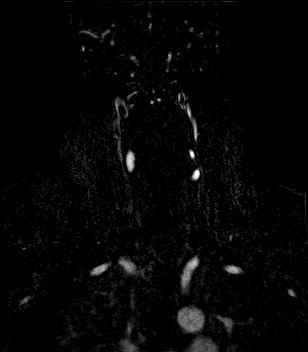
[im 65/93]
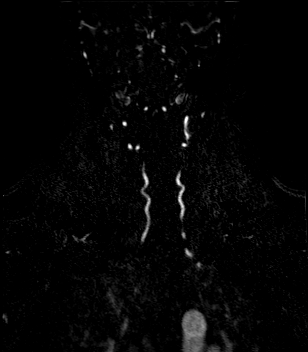
[im 74/93]
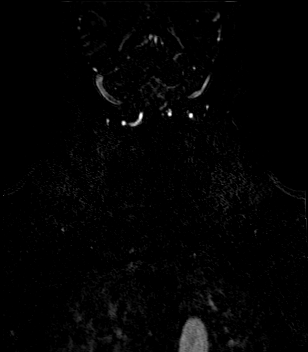
[im 83/93]
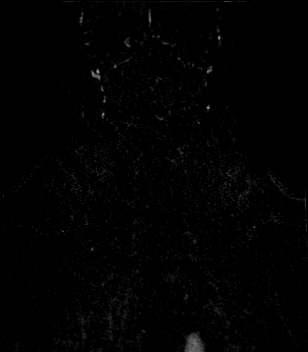
[im 93/93]
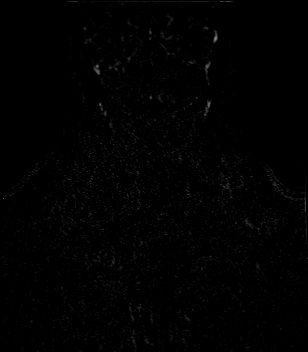

[30 of 48 positions shown; findings below may reference images not displayed]

FINDINGS: Time-of-flight shows antegrade flow in both carotid and vertebrals.

The left ICA is smaller than the right in the setting of left A1
aplasia and fetal type right PCA. The carotids are smooth and widely
patent.

Unremarkable arch and proximal subclavian arteries.

Both vertebral arteries are smooth and patent to the dura when
accounting for tortuosity and mild artifact. The origins are not
well seen on this study but both were widely patent on recent CTA.
Poor vertebral visualization on prior CTA attributed to soft tissue
attenuation.
IMPRESSION: Negative neck MRA as described. Poor vertebral visualization on
prior CTA is attributed to artifact.

## 2019-05-23 IMAGING — CR DG CERVICAL SPINE 2 OR 3 VIEWS
2 series · 2 of 2 positions shown · non-contrast
Comparison: Intraoperative fluoro spot images August 09, 2018

CLINICAL DATA: Status post anterior cervical fusion yesterday.

EXAM:
CERVICAL SPINE - 2-3 VIEW

[c-spine lat]
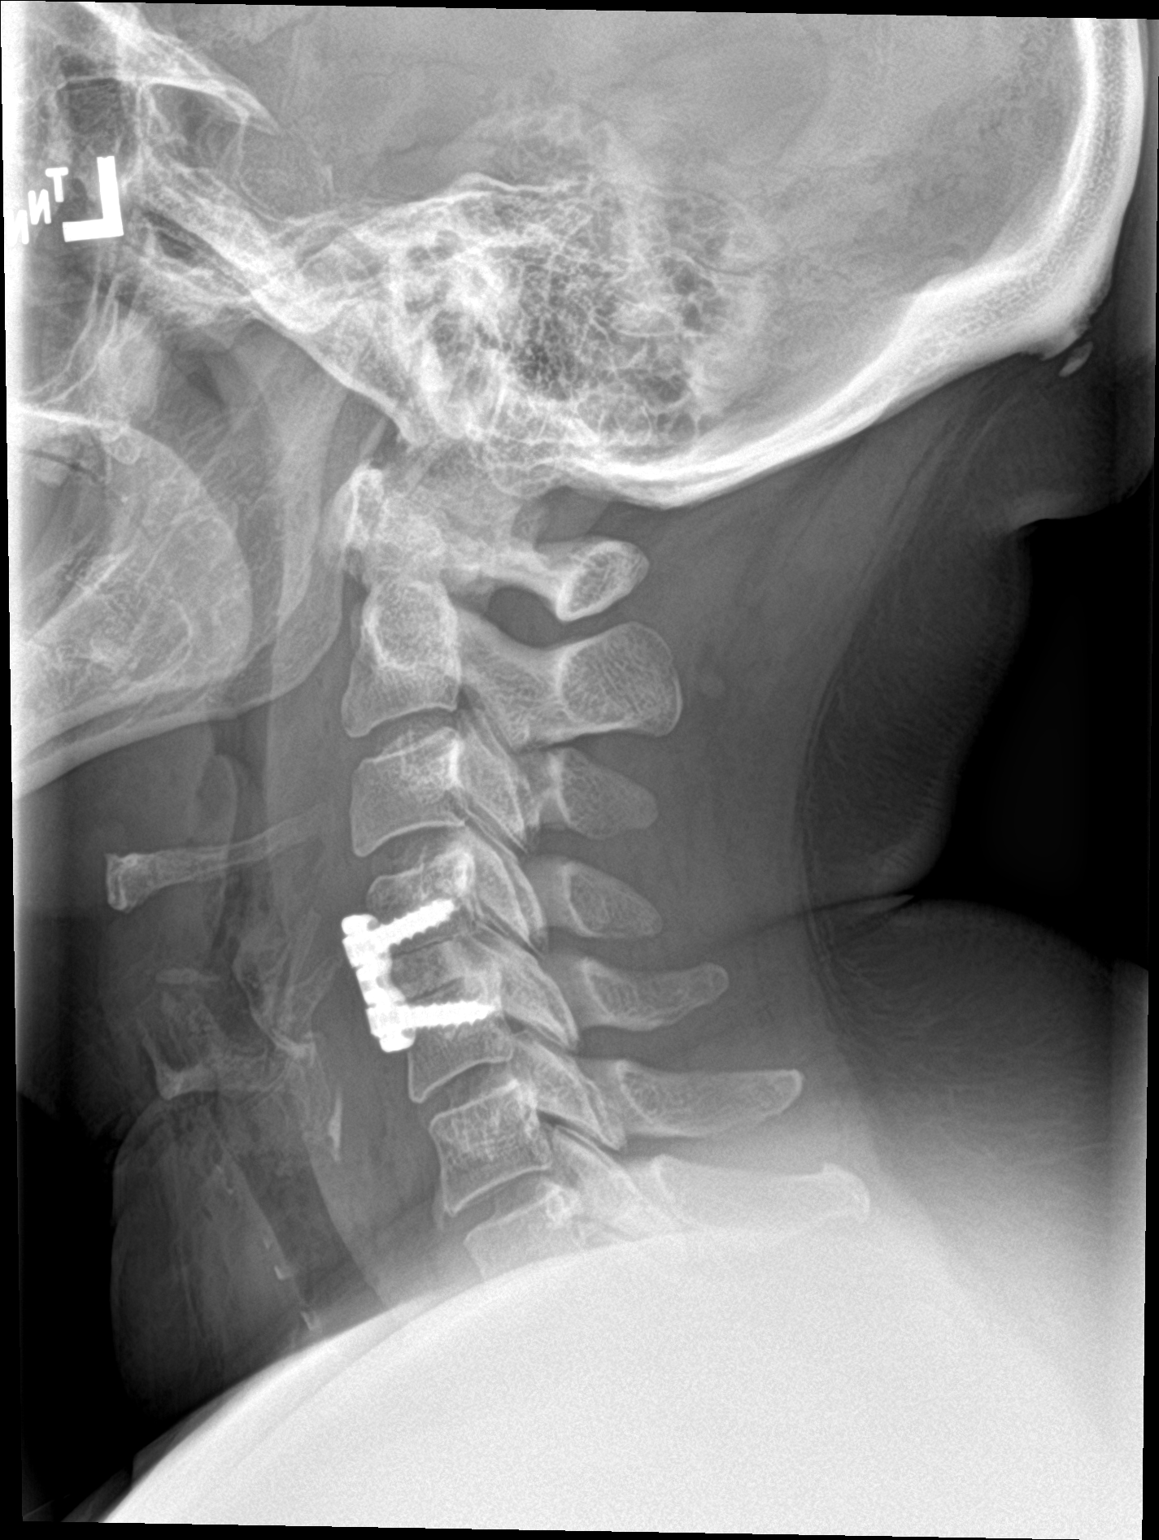

[c-spine ap]
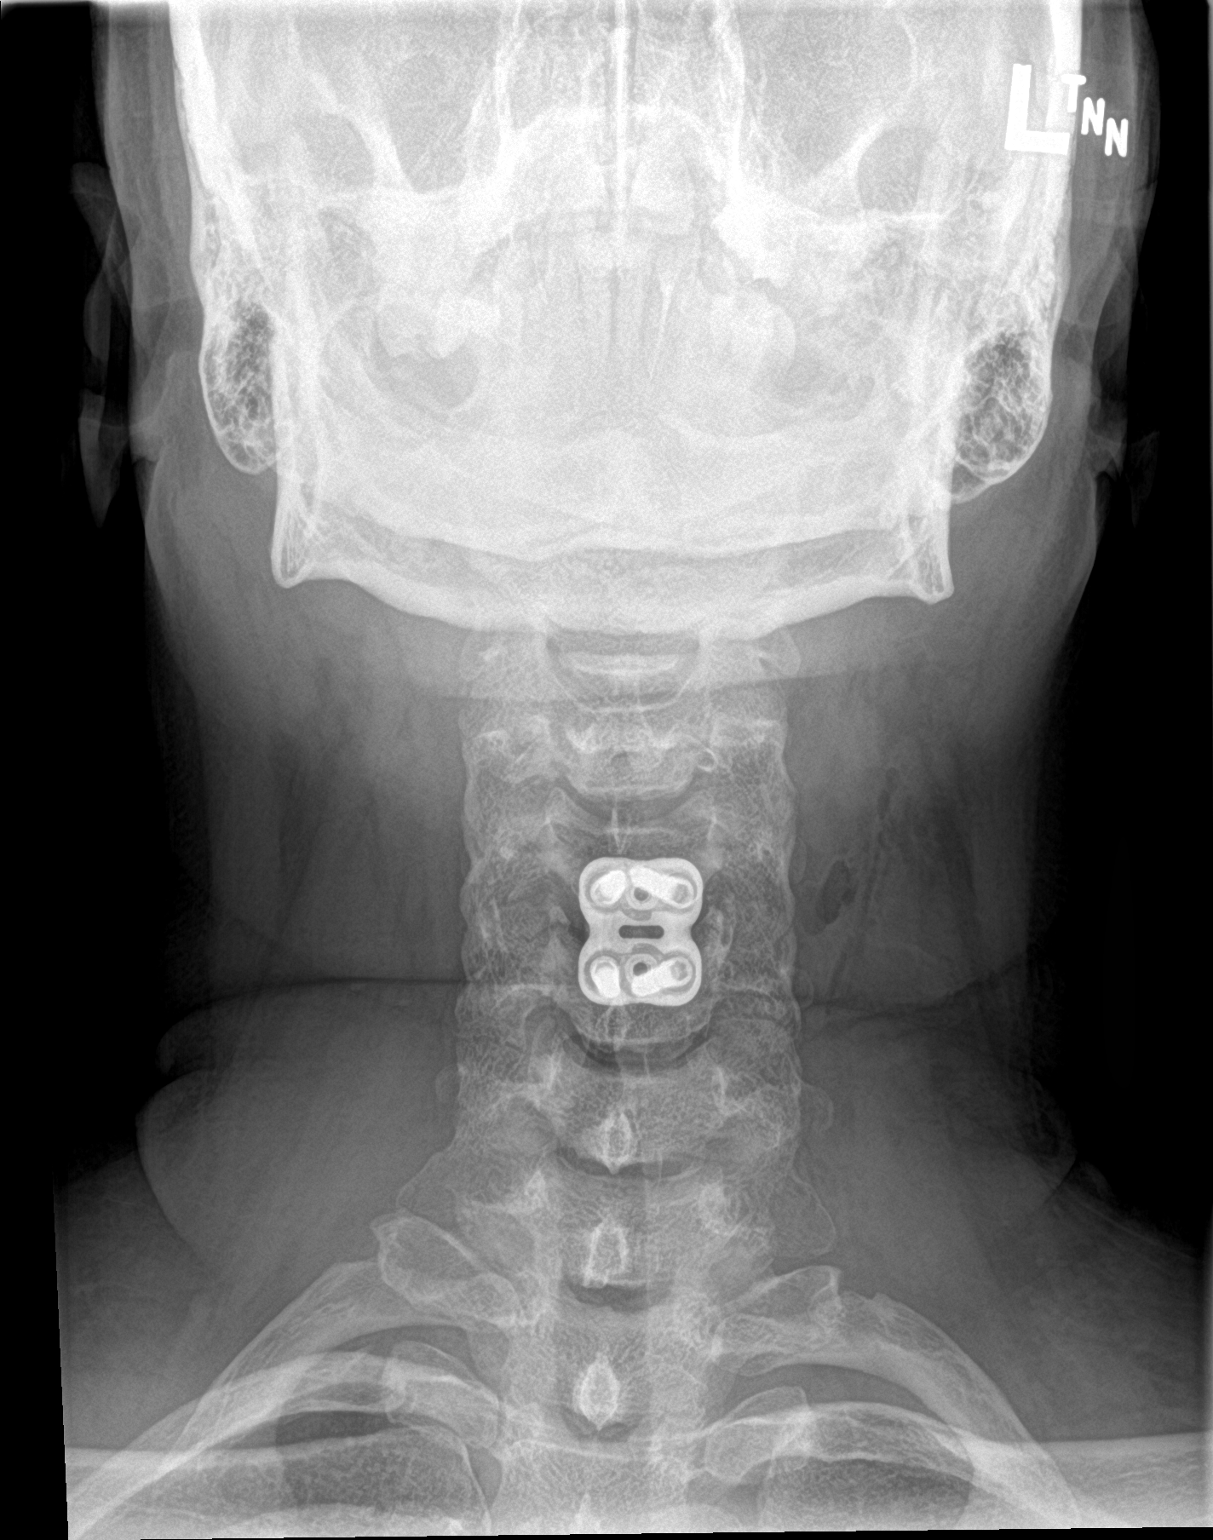

[2 of 2 positions shown; findings below may reference images not displayed]

FINDINGS: The patient has undergone ACDF at C4-5. Radiographic positioning of
the fusion hardware appears good. The posterior elements are intact.
The vertebral bodies are preserved in height. There is moderate
prevertebral soft tissue swelling.
IMPRESSION: No postprocedure complication following ACDF at C4-5.

## 2019-05-28 IMAGING — CR DG CHEST 2V
1 series · 2 of 2 positions shown · non-contrast
Comparison: 07/18/2018

CLINICAL DATA: Increasing difficulty breathing, sore throat and
painful swallowing since cervical spine surgery last [REDACTED]

EXAM:
CHEST - 2 VIEW

[Series 1: dg chest 2 view · 0.14mm/px · 2 of 2 slices shown]
[im 1/2]
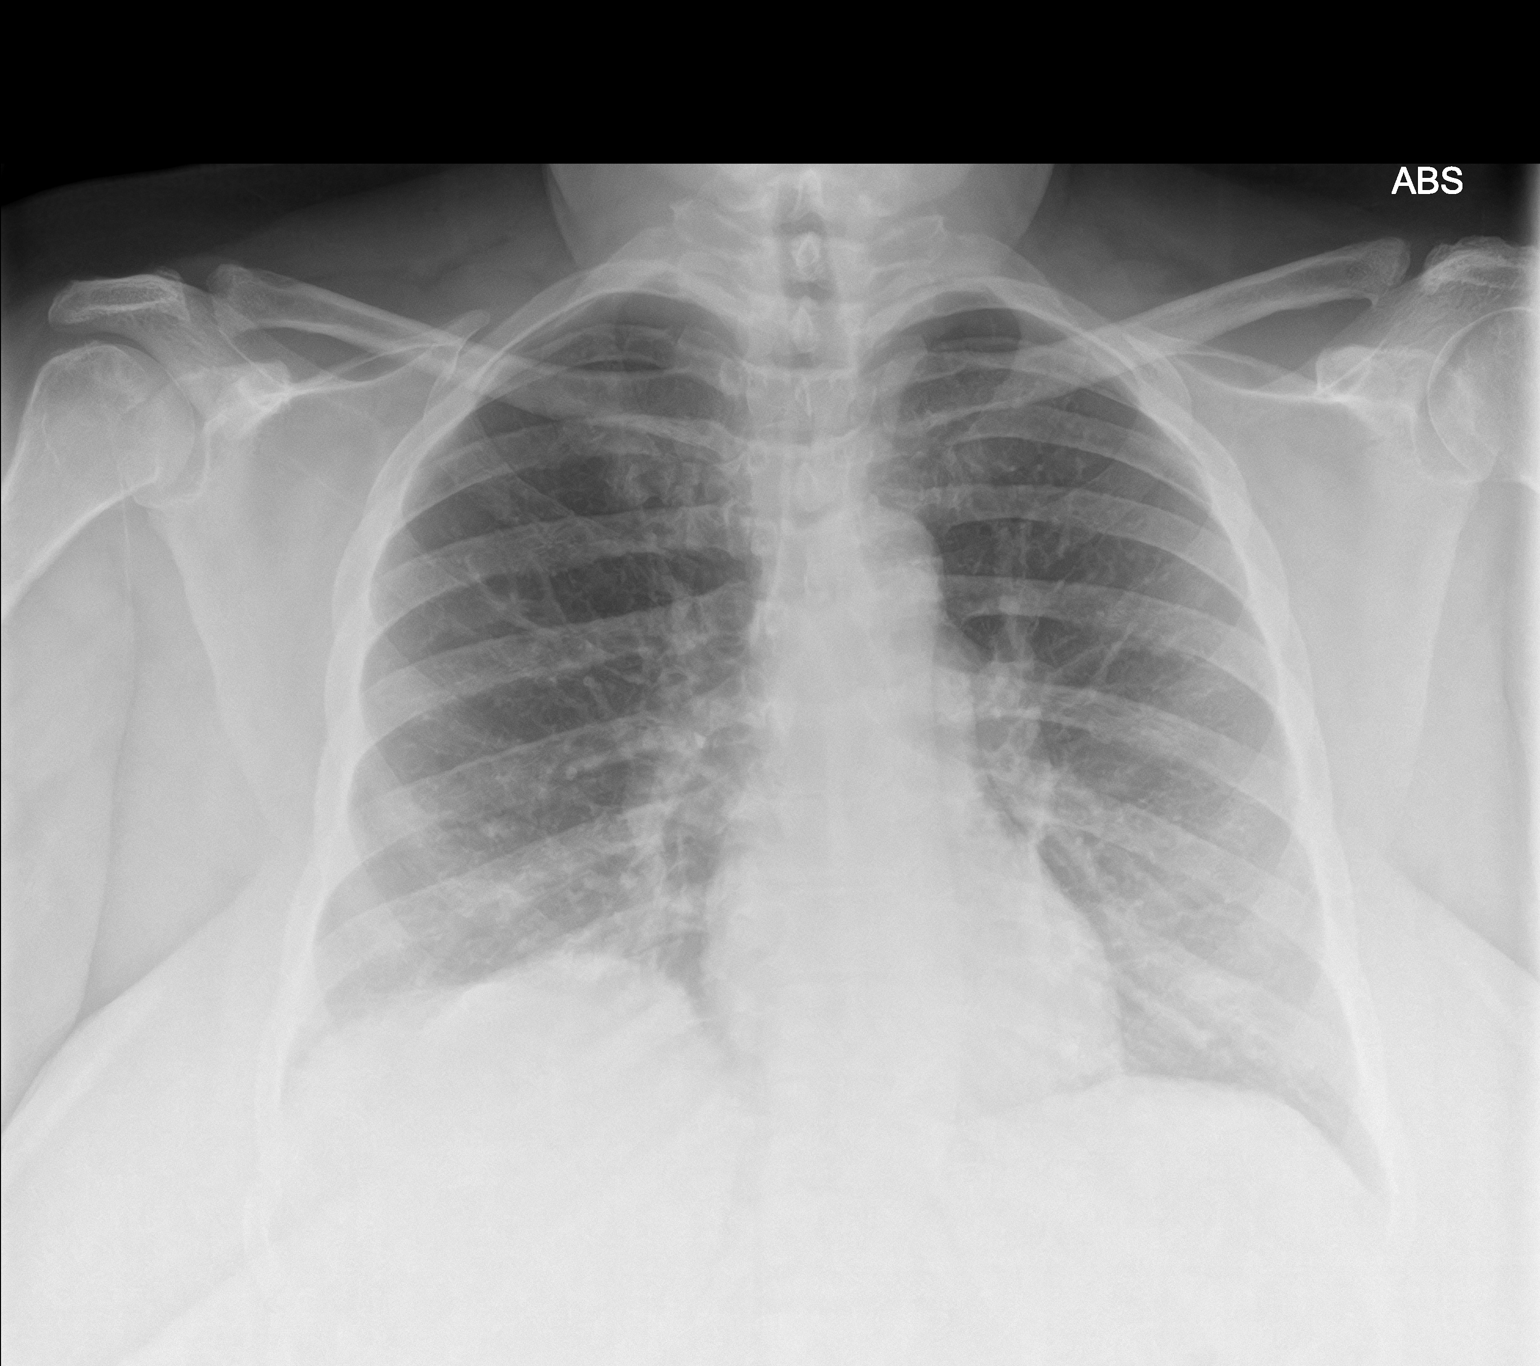
[im 2/2]
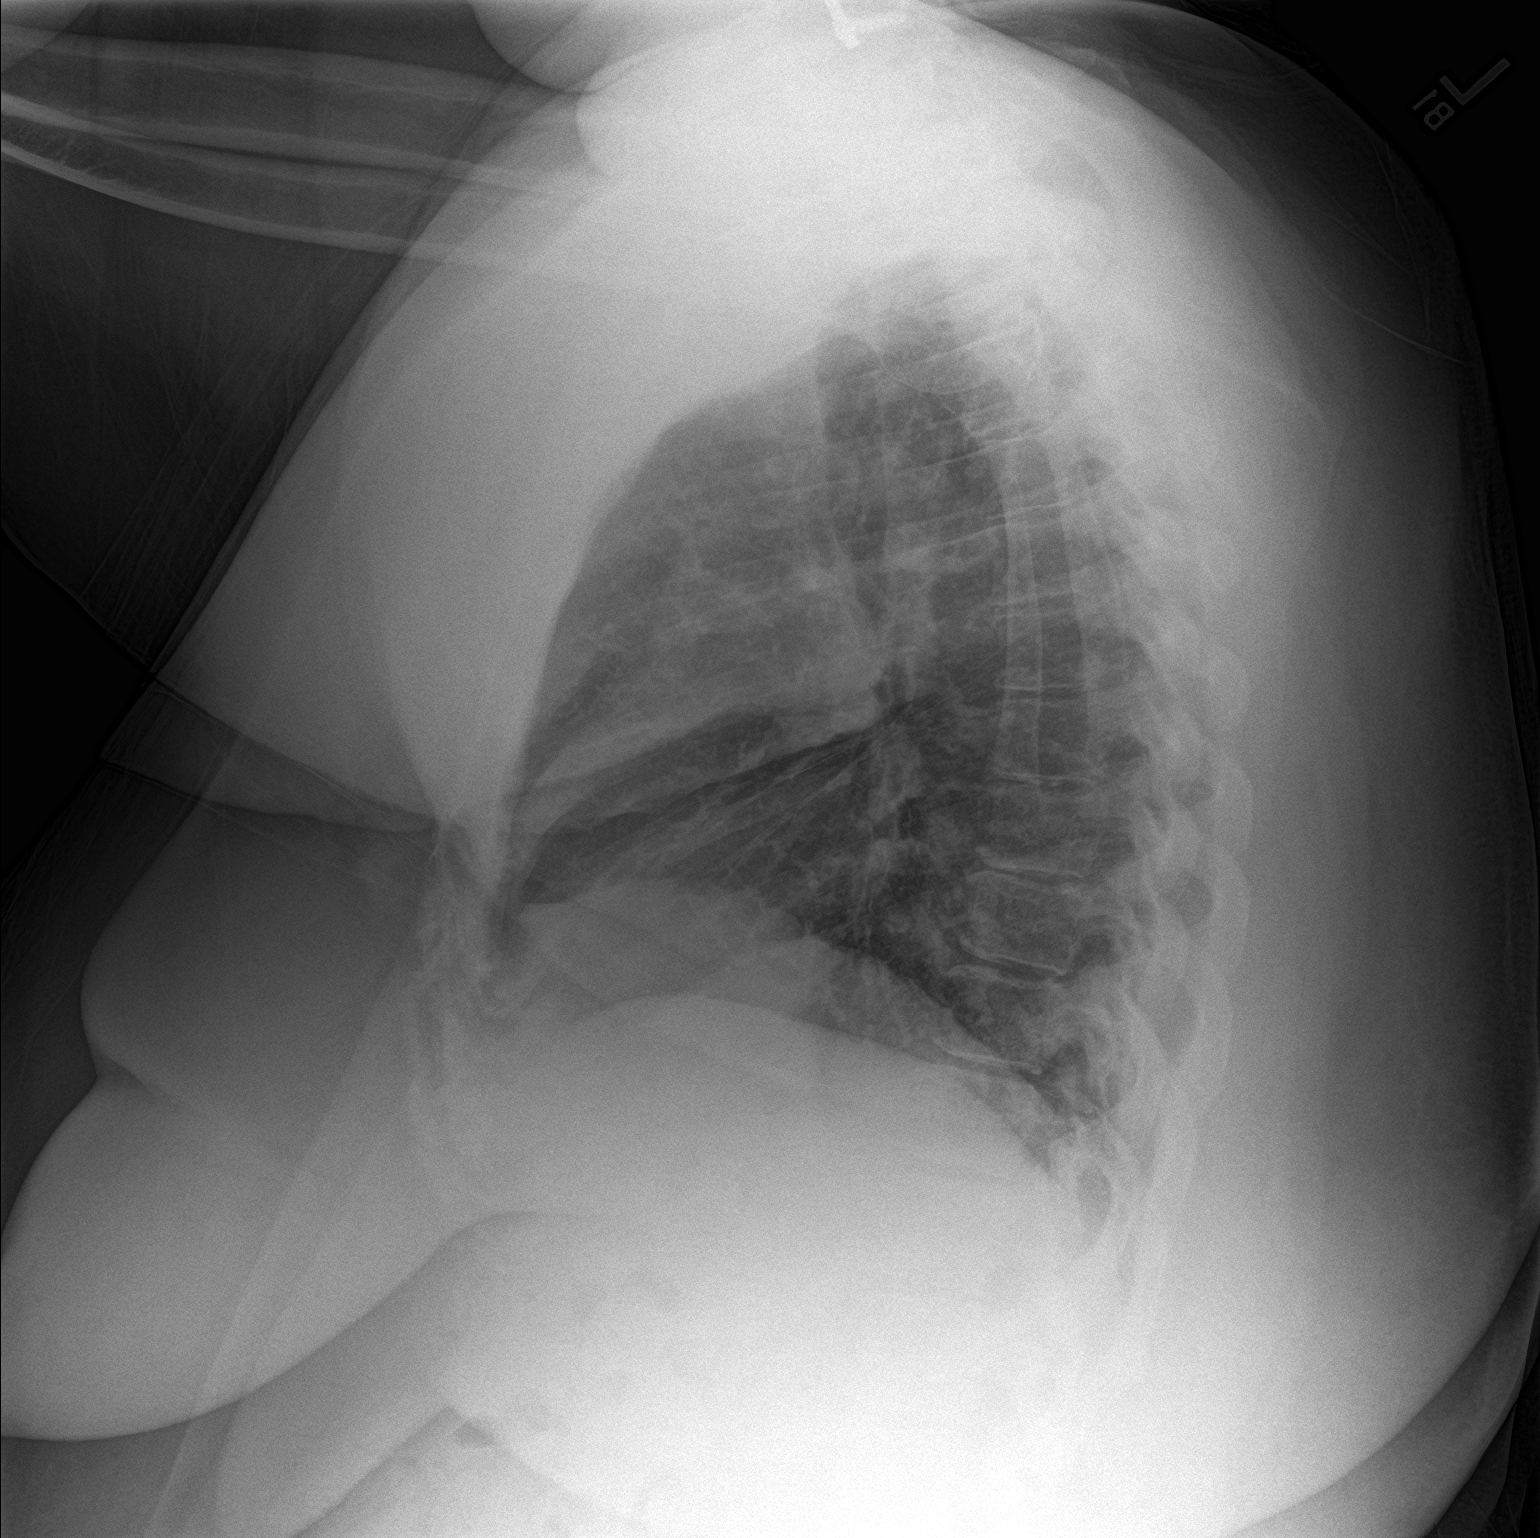

[2 of 2 positions shown; findings below may reference images not displayed]

FINDINGS: Normal heart size, mediastinal contours, and pulmonary vascularity.

Mild RIGHT basilar atelectasis.

Lungs otherwise clear.

No infiltrate, pleural effusion or pneumothorax.

Bones unremarkable.
IMPRESSION: Mild RIGHT basilar atelectasis.

## 2019-05-28 IMAGING — CT CT ANGIO CHEST
2 of 6 series · 18 of 46 positions shown · IV contrast (omnipaque)
Comparison: CT neck today reported separately.

CLINICAL DATA: 52-year-old female status post cervical spine
surgery on 08/09/2018. Subsequent sore throat, painful swallowing,
difficulty breathing and chest pressure.

EXAM:
CT ANGIOGRAPHY CHEST WITH CONTRAST
TECHNIQUE: Multidetector CT imaging of the chest was performed using the
standard protocol during bolus administration of intravenous
contrast. Multiplanar CT image reconstructions and MIPs were
obtained to evaluate the vascular anatomy.
CONTRAST:  100mL OMNIPAQUE IOHEXOL 350 MG/ML SOLN

[Series 7: thins · axial · 0.70mm/px · z∈[-404,-164]mm · 16 of 264 slices shown]
[im 12/264  lung]
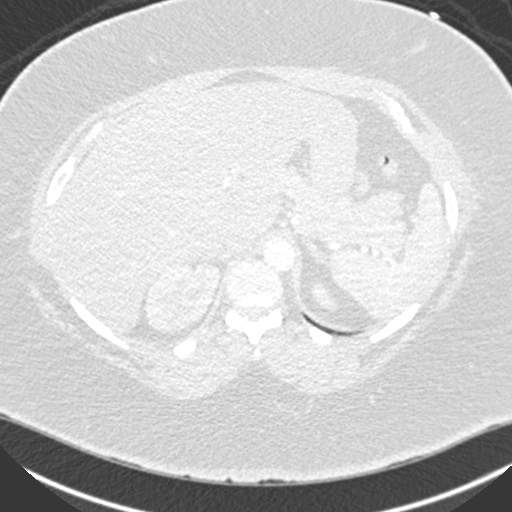
[im 35/264  soft-tissue]
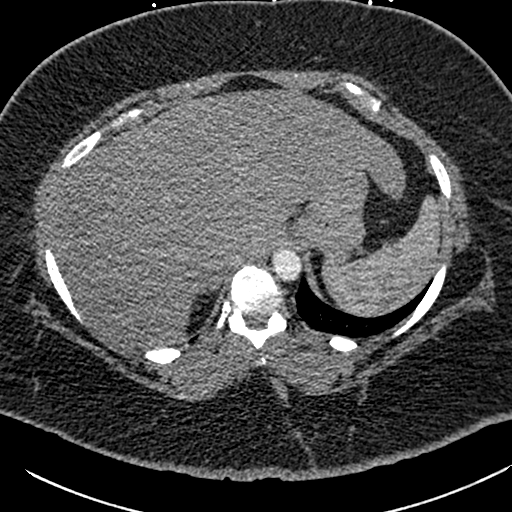
[im 46/264  lung]
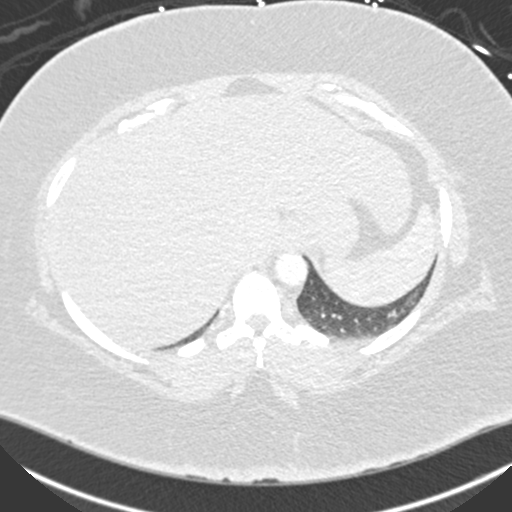
[im 58/264  soft-tissue]
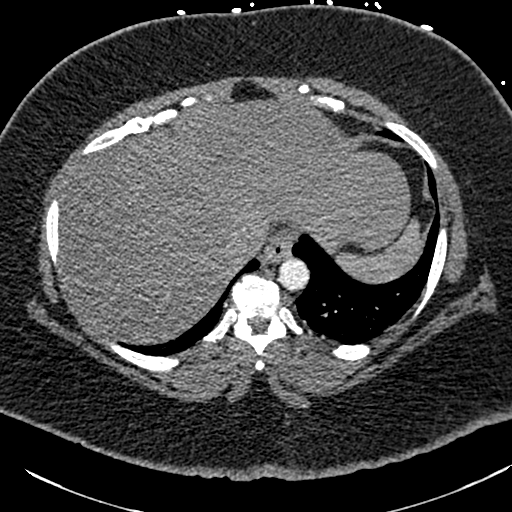
[im 81/264  lung]
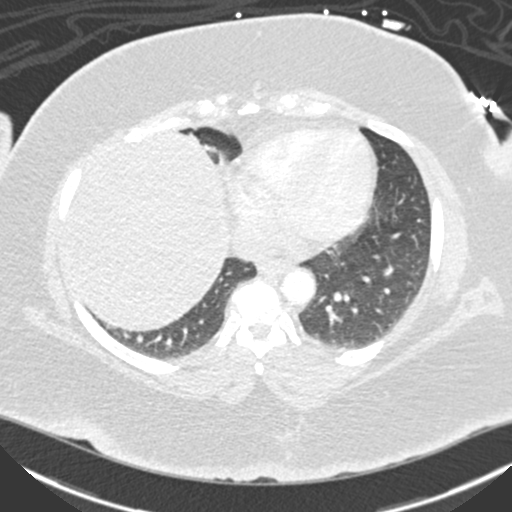
[im 92/264  soft-tissue]
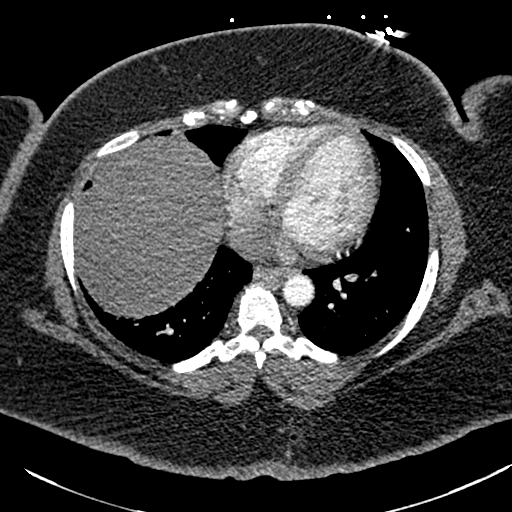
[im 103/264  lung]
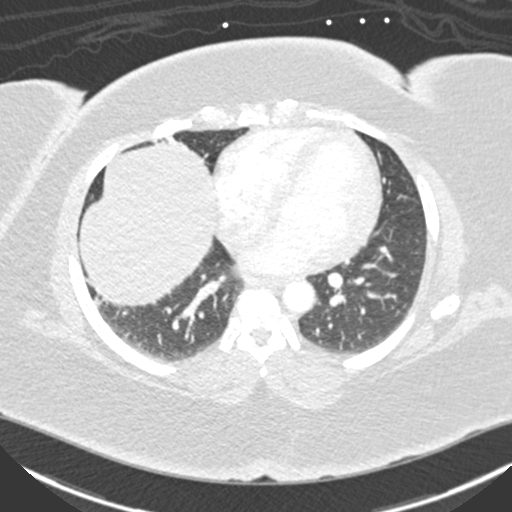
[im 126/264  soft-tissue]
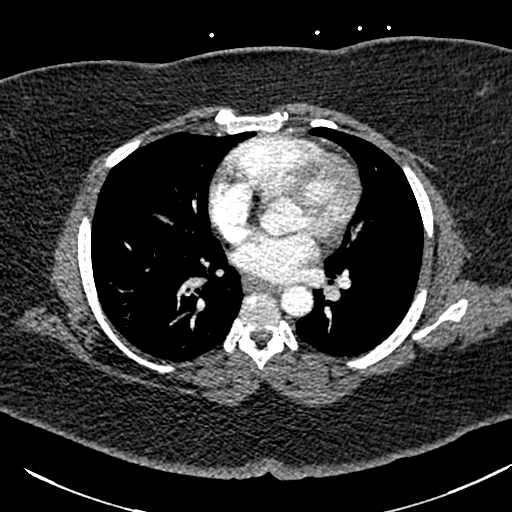
[im 138/264  lung]
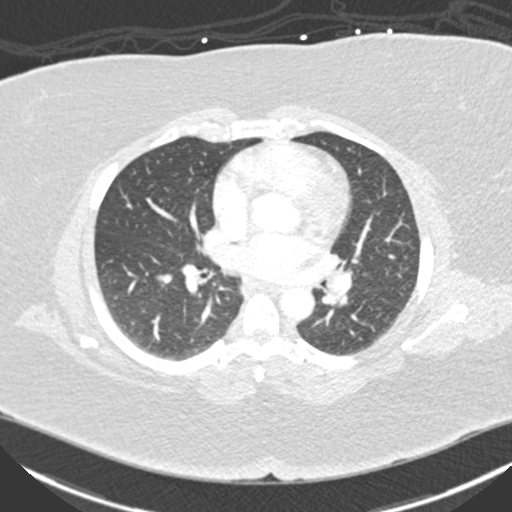
[im 161/264  soft-tissue]
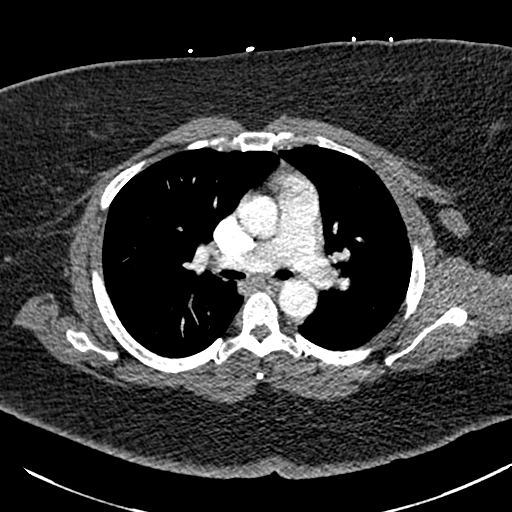
[im 172/264  lung]
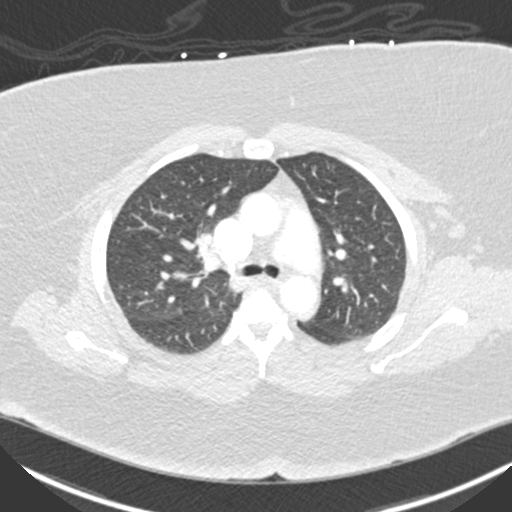
[im 183/264  soft-tissue]
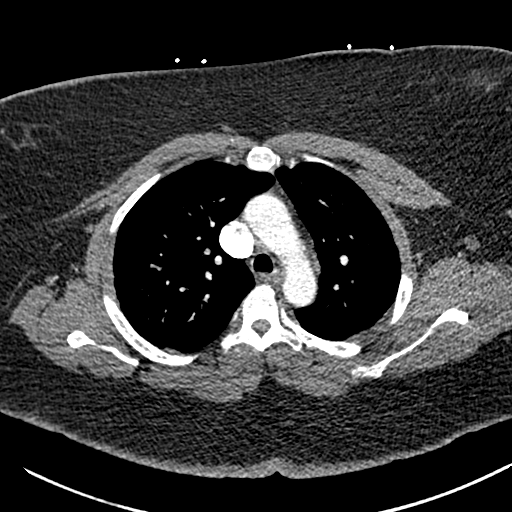
[im 206/264  lung]
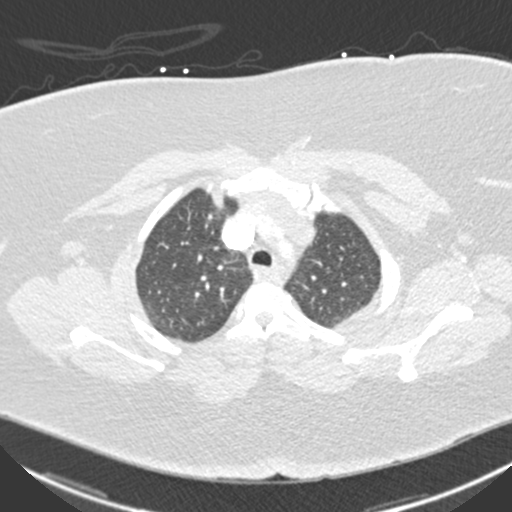
[im 218/264  soft-tissue]
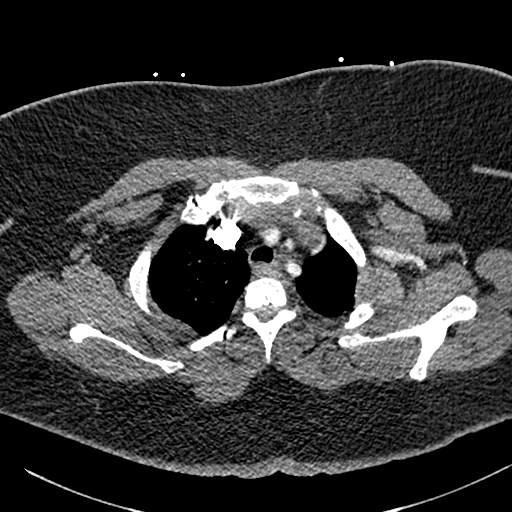
[im 229/264  lung]
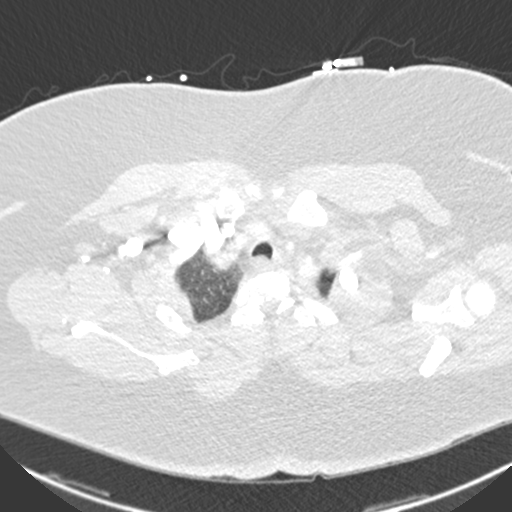
[im 252/264  soft-tissue]
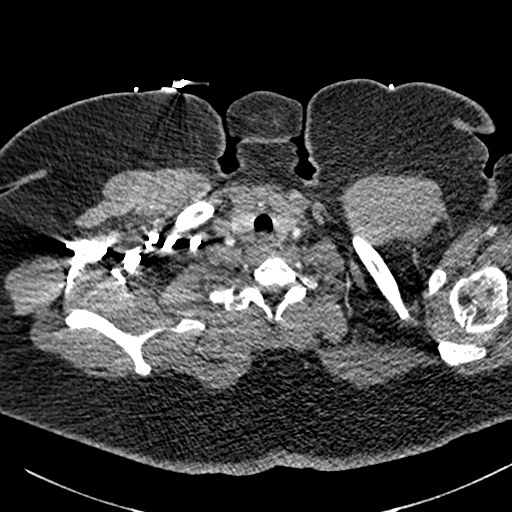

[Series 9: coronal mpr · coronal · 0.53mm/px · 2 of 98 slices shown]
[im 33/98  soft-tissue]
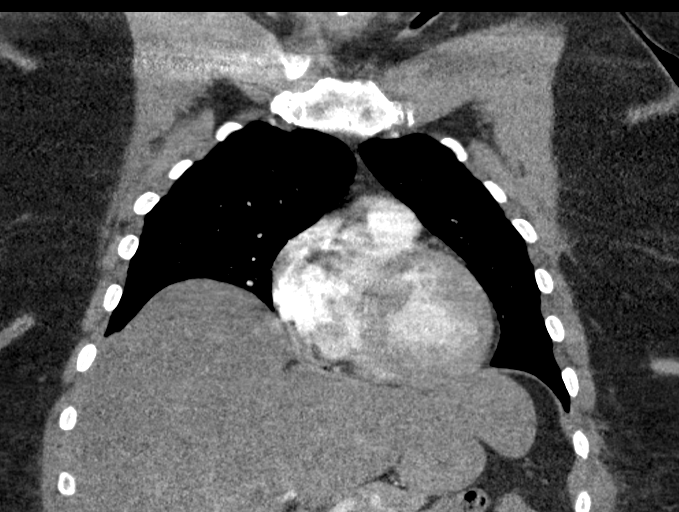
[im 65/98  soft-tissue]
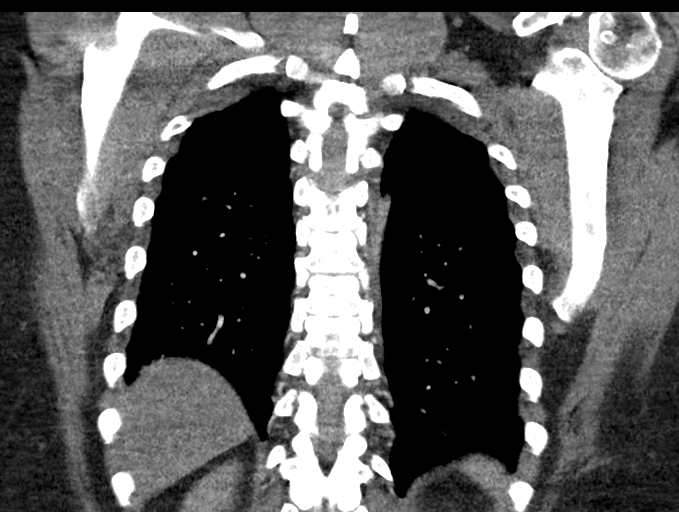

[18 of 46 positions shown; findings below may reference images not displayed]

Chest CTA 03/29/2018.

Intraoperative images 08/09/18. Postoperative cervical radiographs
08/10/2018. CTA head and neck 07/18/2018.
FINDINGS: Cardiovascular: Suboptimal contrast bolus timing in the pulmonary
arterial tree. Superimposed mild respiratory motion and quantum
mottle artifact due to large body habitus.

No central or hilar pulmonary embolus. No convincing pulmonary
artery filling defect elsewhere.

Stable cardiac size at the upper limits of normal. No pericardial
effusion. Stable and negative visible aorta.

Mediastinum/Nodes: Negative.  No lymphadenopathy.

Lungs/Pleura: Mildly lower lung volumes compared to 03/29/2018. The
major airways are patent. Chronic elevation of the right
hemidiaphragm. Mild right lung base atelectasis. Mild superimposed
dependent atelectasis in both lungs.

No other abnormal pulmonary opacity.  No pleural effusion.

Upper Abdomen: Negative visible liver, spleen, pancreas, adrenal
glands, kidneys, and proximal bowel in the upper abdomen. Suggestion
of splenic flexure diverticulosis.

Musculoskeletal: No acute osseous abnormality identified. Lower
thoracic disc degeneration with vacuum disc.

Review of the MIP images confirms the above findings.
IMPRESSION: 1. Suboptimal exam due to contrast timing and large body habitus but
no strong evidence of acute pulmonary embolus.
2. Atelectasis, no other acute pulmonary findings.

## 2019-05-28 IMAGING — CT CT NECK W/ CM
4 of 6 series · 13 of 33 positions shown, 15 images · IV contrast (omnipaque)
Comparison: Intraoperative images 08/09/18.

CLINICAL DATA: 52-year-old female status post cervical spine
surgery on 08/09/2018. Subsequent sore throat, painful swallowing,
difficulty breathing and chest pressure.

EXAM:
CT NECK WITH CONTRAST
TECHNIQUE: Multidetector CT imaging of the neck was performed using the
standard protocol following the bolus administration of intravenous
contrast.
CONTRAST:  100mL OMNIPAQUE IOHEXOL 350 MG/ML SOLN in conjunction
with contrast enhanced imaging of the chest reported separately.

[Series 7: sag neck · sagittal · 0.55mm/px · 5 of 127 slices shown, 6 images]
[im 43/127  bone]
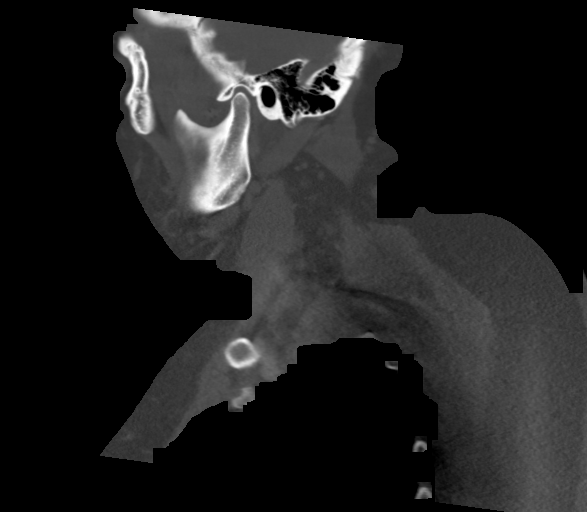
[im 53/127  bone]
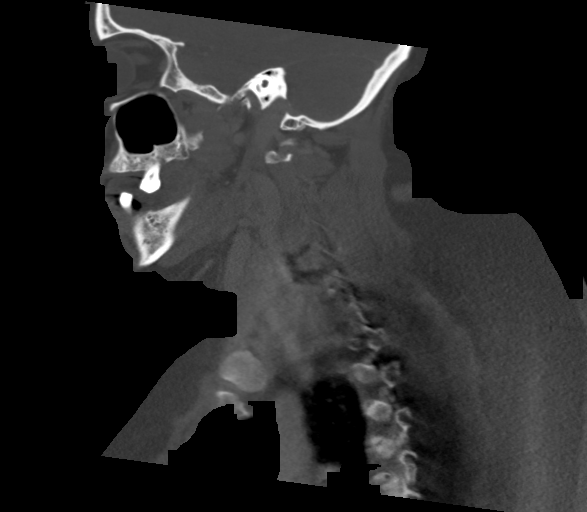
[im 64/127  soft-tissue]
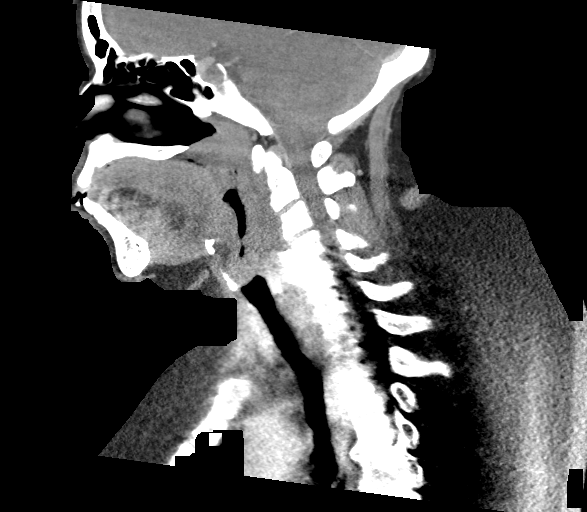
[im 64/127  bone]
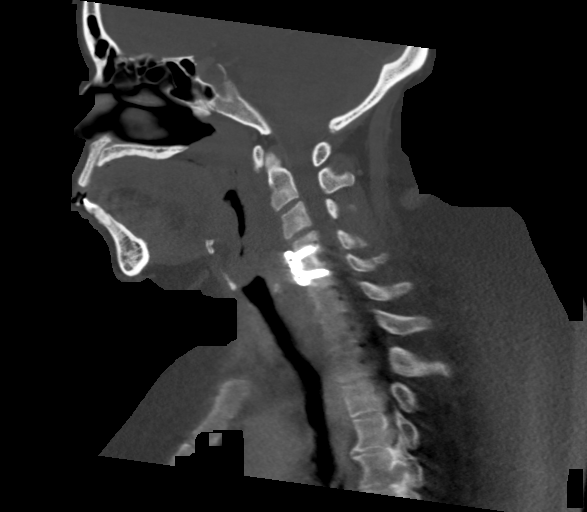
[im 74/127  bone]
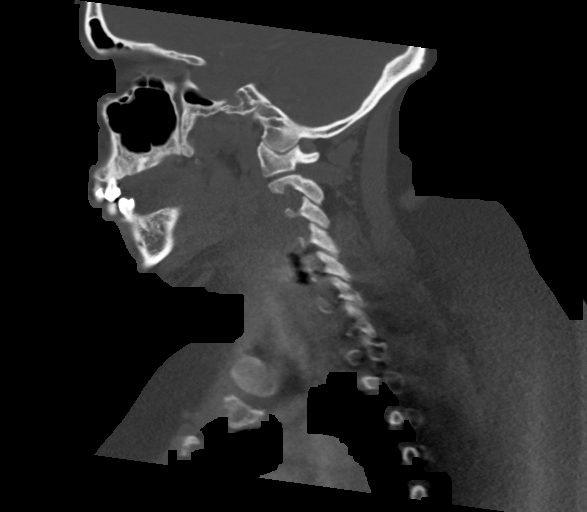
[im 85/127  bone]
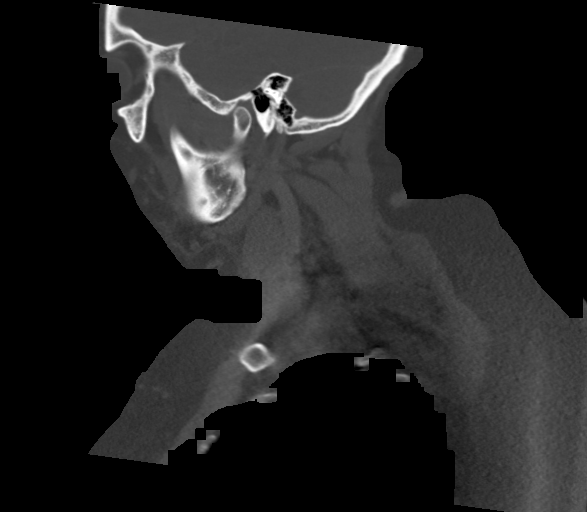

[Series 8: cor neck · coronal · 0.53mm/px · 3 of 133 slices shown]
[im 27/133  bone]
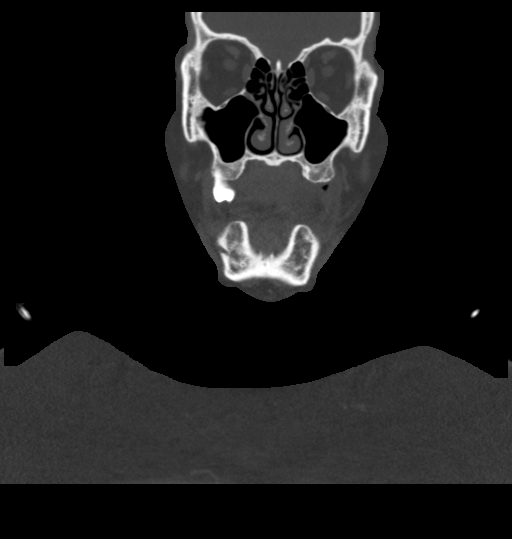
[im 53/133  bone]
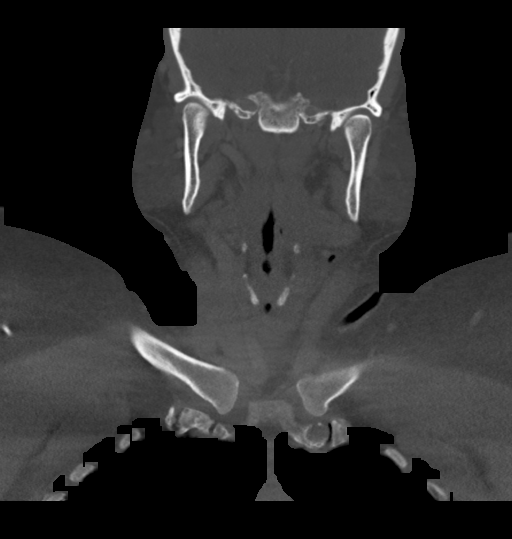
[im 80/133  bone]
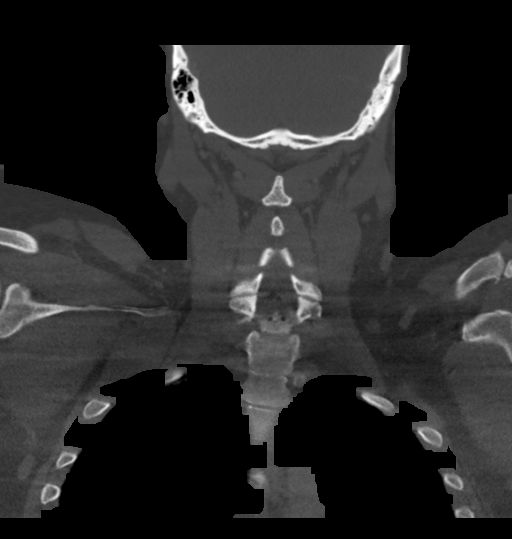

[Series 9: orthogonal ax · axial · 0.48mm/px · z∈[-235,-90]mm · 3 of 153 slices shown, 4 images]
[im 39/153  soft-tissue]
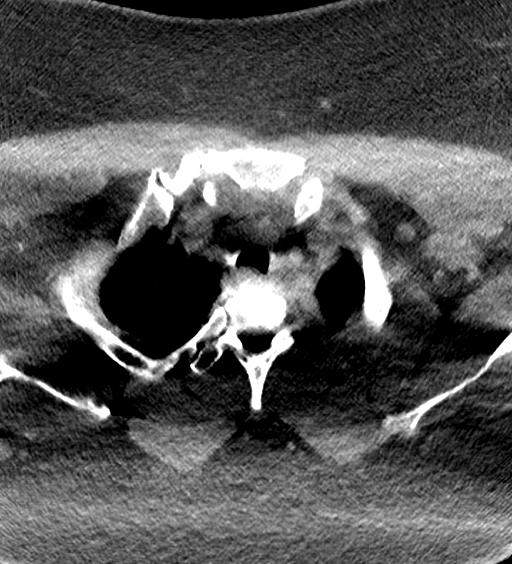
[im 39/153  bone]
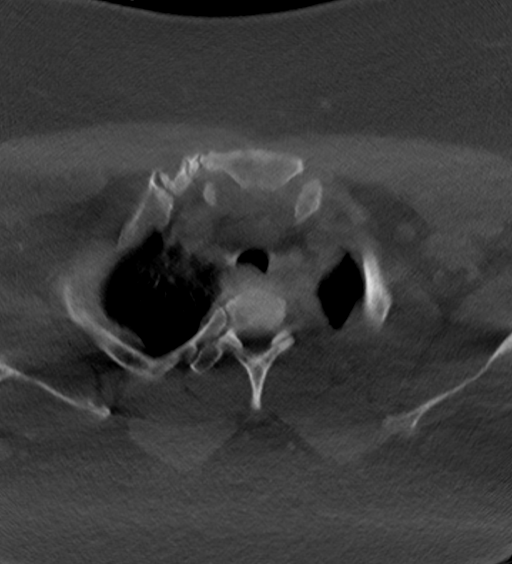
[im 77/153  bone]
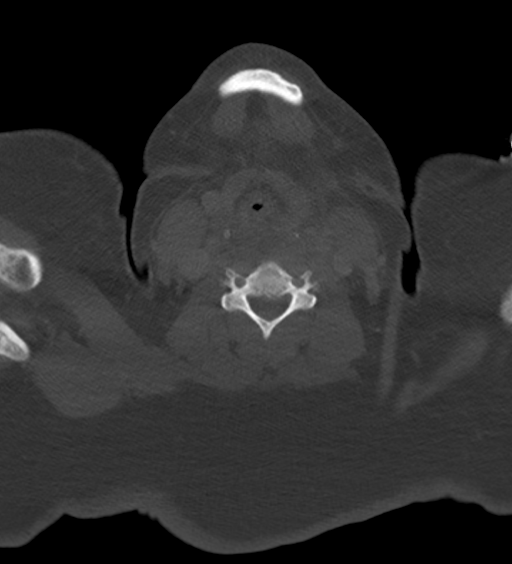
[im 115/153  bone]
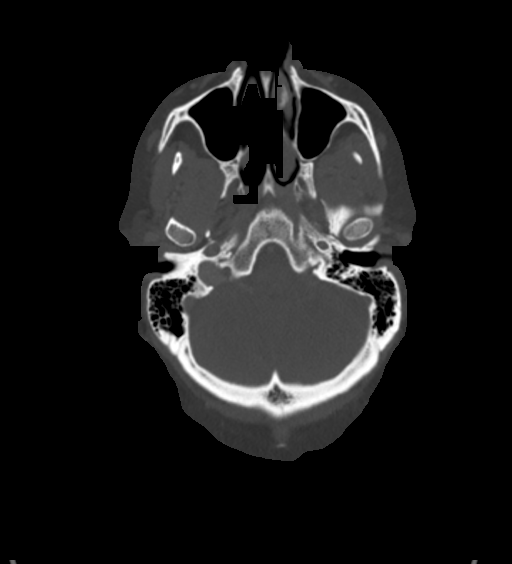

[Series 10: axial neck (person_name) · axial · 0.56mm/px · z∈[-172,-92]mm · 2 of 122 slices shown]
[im 41/122  bone]
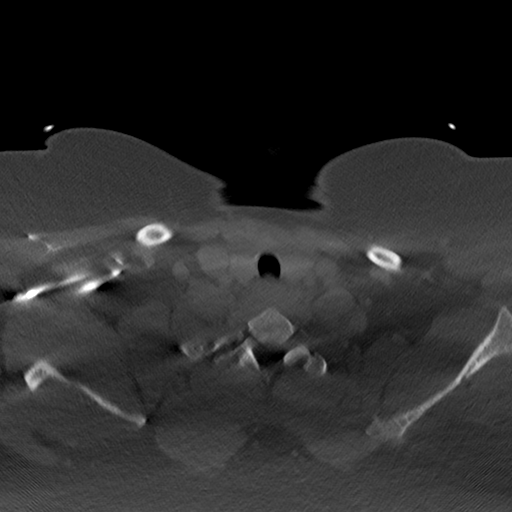
[im 81/122  bone]
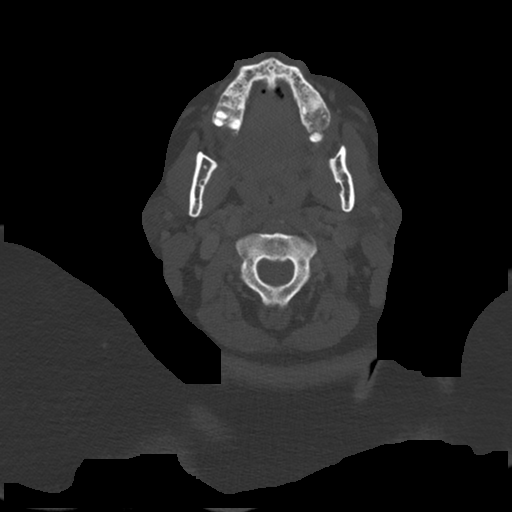

[13 of 33 positions shown; findings below may reference images not displayed]

Postoperative cervical
radiographs 08/10/2018. CTA head and neck 07/18/2018. Chest CTA
today reported separately, and 03/29/2018.
FINDINGS: Quantum mottle artifact due to large body habitus.

Pharynx and larynx: The glottis is closed. Laryngeal and pharyngeal
soft tissue contours appear similar to the [place] CTA. Negative
parapharyngeal spaces.

There is retropharyngeal or prevertebral soft tissue thickening or
fluid measuring up to 10 millimeters (series 7, image 65). No rim
enhancing collection. Enhancement of small retropharyngeal vessels
on series 3, image 48 favors edema over a discrete fluid collection.
There is subsequent mild mass effect on the posterior pharyngeal
wall.

Salivary glands: Negative sublingual space. Negative submandibular
and parotid glands.

Thyroid: Negative; detail limited by artifact.

Lymph nodes: No cervical lymphadenopathy. Bilateral lymph nodes are
stable and within normal limits.

Vascular: The major vascular structures in the neck and at the skull
base appears stable, no large vessel occlusion identified.

Limited intracranial: Negative.

Visualized orbits: Negative.

Mastoids and visualized paranasal sinuses: Stable and clear.

Skeleton: C4-C5 ACDF hardware appears intact without evidence of
loosening. Other cervical spine levels appears stable. No acute
osseous abnormality identified.

Upper chest: Chest CTA today is reported separately.
IMPRESSION: 1. Status post cervical ACDF with mild prevertebral edema, or less
likely postoperative seroma, resulting in mild mass effect on the
posterior pharynx. Prevertebral soft tissue thickening up to 10
millimeters.
2. C4-C5 ACDF hardware with no adverse features.
3. Anatomic detail limited in some areas due to the artifact from
large body habitus.

## 2019-05-31 ENCOUNTER — Other Ambulatory Visit: Payer: Self-pay | Admitting: Surgery

## 2019-05-31 DIAGNOSIS — M75111 Incomplete rotator cuff tear or rupture of right shoulder, not specified as traumatic: Secondary | ICD-10-CM

## 2019-05-31 DIAGNOSIS — M7581 Other shoulder lesions, right shoulder: Secondary | ICD-10-CM

## 2019-06-04 ENCOUNTER — Other Ambulatory Visit: Payer: Self-pay

## 2019-06-04 ENCOUNTER — Ambulatory Visit
Admission: RE | Admit: 2019-06-04 | Discharge: 2019-06-04 | Disposition: A | Discharge status: Home / Self Care | Payer: Medicaid Other | Source: Ambulatory Visit | Attending: Surgery | Admitting: Surgery

## 2019-06-04 DIAGNOSIS — M75111 Incomplete rotator cuff tear or rupture of right shoulder, not specified as traumatic: Secondary | ICD-10-CM | POA: Diagnosis present

## 2019-06-04 DIAGNOSIS — M7581 Other shoulder lesions, right shoulder: Secondary | ICD-10-CM

## 2019-07-18 ENCOUNTER — Ambulatory Visit (INDEPENDENT_AMBULATORY_CARE_PROVIDER_SITE_OTHER): Payer: Medicaid Other | Admitting: Certified Nurse Midwife

## 2019-07-18 ENCOUNTER — Encounter: Payer: Self-pay | Admitting: Certified Nurse Midwife

## 2019-07-18 ENCOUNTER — Other Ambulatory Visit: Payer: Self-pay

## 2019-07-18 VITALS — BP 117/91 | HR 92 | Ht 65.0 in | Wt 355.4 lb

## 2019-07-18 DIAGNOSIS — N921 Excessive and frequent menstruation with irregular cycle: Secondary | ICD-10-CM | POA: Diagnosis not present

## 2019-07-18 DIAGNOSIS — N939 Abnormal uterine and vaginal bleeding, unspecified: Secondary | ICD-10-CM

## 2019-07-18 NOTE — Progress Notes (Addendum)
GYN ENCOUNTER NOTE  Subjective:       Brenda Hicks is a 53 y.o. [phone] female is here for gynecologic evaluation of the following issues:  1. Heavy bleeding for 2 wks. She state after her IUD was removed she did not have any bleeding but started having spotting and over the past 2 wks has had heavy bleeding. She has passed clots and needed to wear tampon and a pad. She states she wants a hysterectomy. That she had requested one in the past but was told she was to young and she would like to revisit this possibility.     Gynecologic History Patient's last menstrual period was 07/02/2019 (exact date). Contraception: none Last Pap: 2017. Results were: normal Last mammogram: 2019 Results were: normal  Obstetric History OB History  Gravida Para Term Preterm AB Living  _0 0 6  SAB TAB Ectopic Multiple Live Births      0   2    # Outcome Date GA Lbr Len/2nd Weight Sex Delivery Anes PTL Lv  6 Preterm 12/31/84   4 lb 11 oz (2.126 kg) M Vag-Spont  Y LIV  5 Term 03/05/84   6 lb 7 oz (2.92 kg) M Vag-Spont  N LIV  4 Preterm           3 Term           2 Term           1 Term             Past Medical History:  Diagnosis Date  . Anemia    vitamin d deficiency  . Arthritis   . Asthma    WELL CONTROLLED  . 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 Diabetes mellitus without complication ([place])   . Fatty liver   . Hypertension   . Kidney cysts    per patient, never had  . Renal disorder   . Sleep apnea    USES CPAP. waiting for new machine and a new sleep study  . Stroke [facility]) May or June 2019   TIA. no residual symptoms    Past Surgical History:  Procedure Laterality Date  . ANTERIOR CERVICAL DECOMP/DISCECTOMY FUSION N/A 08/09/2018   Procedure: ANTERIOR CERVICAL DECOMPRESSION/DISCECTOMY FUSION 1 LEVEL- C4-5;  Surgeon: Meade Maw, MD;  Location: [facility] ORS;  Service: Neurosurgery;  Laterality: N/A;  . BACK SURGERY    . DILATION AND CURETTAGE OF UTERUS    . ENDOMETRIAL  BIOPSY     benign  . EXPLORATORY LAPAROTOMY  1992   REMOVAL OF RUPTURED ECTOPIC  . HAND SURGERY Right 1998   cyst removed  . HERNIA REPAIR  9211   UMBILICAL  . JOINT REPLACEMENT Right 2014   TKR  . KNEE ARTHROSCOPY Right 2012  . KNEE SURGERY Right 2014   total knee replacement  . SHOULDER ARTHROSCOPY WITH BICEPSTENOTOMY Left 12/14/2016   Procedure: SHOULDER ARTHROSCOPY WITH BICEPSTENOTOMY;  Surgeon: Corky Mull, MD;  Location: [facility] ORS;  Service: Orthopedics;  Laterality: Left;  . SHOULDER ARTHROSCOPY WITH OPEN ROTATOR CUFF REPAIR Left 12/14/2016   Procedure: SHOULDER ARTHROSCOPY WITH OPEN ROTATOR CUFF REPAIR AND ARTHROSCOPIC ROTATOR CUFF REPAIR;  Surgeon: Corky Mull, MD;  Location: [facility] ORS;  Service: Orthopedics;  Laterality: Left;  . SHOULDER ARTHROSCOPY WITH ROTATOR CUFF REPAIR Right 01/04/2019   Procedure: SHOULDER ARTHROSCOPY WITH ROTATOR CUFF REPAIR;  Surgeon: Corky Mull, MD;  Location: [facility] ORS;  Service:  Orthopedics;  Laterality: Right;  . SHOULDER ARTHROSCOPY WITH SUBACROMIAL DECOMPRESSION Left 12/14/2016   Procedure: SHOULDER ARTHROSCOPY WITH SUBACROMIAL DECOMPRESSION;  Surgeon: Corky Mull, MD;  Location: [facility] ORS;  Service: Orthopedics;  Laterality: Left;  . SHOULDER ARTHROSCOPY WITH SUBACROMIAL DECOMPRESSION AND BICEP TENDON REPAIR Right 01/04/2019   Procedure: SHOULDER ARTHROSCOPY WITH DEBRIDEMENT AND SUBACROMIAL DECOMPRESSION-RIGHT;  Surgeon: Corky Mull, MD;  Location: [facility] ORS;  Service: Orthopedics;  Laterality: Right;  . TUBAL LIGATION      Current Outpatient Medications on File Prior to Visit  Medication Sig Dispense Refill  . Accu-Chek FastClix Lancets MISC TEST TID    . ACCU-CHEK GUIDE test strip U TID UTD    . albuterol (PROVENTIL HFA;VENTOLIN HFA) 108 (90 Base) MCG/ACT inhaler Inhale 2 puffs into the lungs every 6 (six) hours as needed for wheezing or shortness of breath.    Marland Kitchen aspirin EC 81 MG tablet Take 81 mg by mouth daily.    . Blood Glucose Monitoring  Suppl (ACCU-CHEK GUIDE) w/Device KIT U UTD TID FOR FINGERSTICK TESTING    . Elastic Bandages & Supports (MEDICAL COMPRESSION STOCKINGS) MISC 2 medium stockings Use as directed on both lower legs 2 each 0  . empagliflozin (JARDIANCE) 25 MG TABS tablet Take 25 mg by mouth daily.    . Exenatide ER (BYDUREON) 2 MG PEN Inject 2 mg into the skin every Sunday.     . insulin glargine (LANTUS) 100 UNIT/ML injection Inject 0.2 mLs (20 Units total) into the skin daily. (Patient taking differently: Inject 100 Units into the skin at bedtime. ) 10 mL 11  . insulin lispro (HUMALOG) 100 UNIT/ML injection Inject 20 Units into the skin 3 (three) times daily before meals.    . Insulin Syringe-Needle U-100 (INSULIN SYRINGE 1CC/31GX5/16") 31G X 5/16" 1 ML MISC USE 1 QID    . lisinopril (PRINIVIL,ZESTRIL) 40 MG tablet Take 40 mg by mouth every evening.     . naproxen sodium (ALEVE) 220 MG tablet Take 220 mg by mouth daily as needed.    . promethazine (PHENERGAN) 25 MG tablet Take 1 tablet (25 mg total) by mouth every 6 (six) hours as needed for nausea or vomiting. 20 tablet 0  . tiZANidine (ZANAFLEX) 4 MG tablet Take 4 mg by mouth 2 (two) times daily.    . traZODone (DESYREL) 50 MG tablet Take 50 mg by mouth at bedtime as needed for sleep.   5  . Vitamin D, Ergocalciferol, (DRISDOL) 1.25 MG (50000 UT) CAPS capsule Take 50,000 Units by mouth every Monday.    . gabapentin (NEURONTIN) 300 MG capsule Take 300 mg by mouth 3 (three) times daily as needed (for neuropathy pain).     Marland Kitchen oxyCODONE (OXY IR/ROXICODONE) 5 MG immediate release tablet Take 1-2 tablets (5-10 mg total) by mouth every 4 (four) hours as needed for moderate pain ((score 4 to 6)). (Patient not taking: Reported on 07/18/2019) 50 tablet 0  . QUEtiapine (SEROQUEL) 50 MG tablet Take 50 mg by mouth at bedtime.     No current facility-administered medications on file prior to visit.     Allergies  Allergen Reactions  . Morphine And Related Nausea And Vomiting   . Tramadol     Nausea. If she takes antinausea medicine with this medicine, then she can tolerate it.    Social History   Socioeconomic History  . Marital status: Married    Spouse name: brian  . Number of children: 7  . Years of education: Not  on file  . Highest education level: Not on file  Occupational History  . 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 Financial resource strain: Not on file  . Food insecurity    Worry: Not on file    Inability: Not on file  . Transportation needs    Medical: Not on file    Non-medical: Not on file  Tobacco Use  . Smoking status: Former Smoker    Packs/day: 2.00    Years: 25.00    Pack years: 50.00    Types: Cigarettes    Quit date: 06/09/2017    Years since quitting: 2.1  . Smokeless tobacco: Never Used  Substance and Sexual Activity  . Alcohol use: No  . Drug use: No  . Sexual activity: Yes    Birth control/protection: I.U.D.  Lifestyle  . Physical activity    Days per week: Not on file    Minutes per session: Not on file  . Stress: Not on file  Relationships  . Social Herbalist on phone: Not on file    Gets together: Not on file    Attends religious service: Not on file    Active member of club or organization: Not on file    Attends meetings of clubs or organizations: Not on file    Relationship status: Not on file  . Intimate partner violence    Fear of current or ex partner: Not on file    Emotionally abused: Not on file    Physically abused: Not on file    Forced sexual activity: Not on file  Other Topics Concern  . Not on file  Social History Narrative  . Not on file    Family History  Problem Relation Age of Onset  . CAD Maternal Grandmother     The following portions of the patient's history were reviewed and updated as appropriate: allergies, current medications, past family history, past medical history, past social history, past surgical history  and problem list.  Review of Systems Review of Systems - Negative except as mentioed in HPI Review of Systems - General ROS: negative for - chills, fatigue, fever, hot flashes, malaise or night sweats Hematological and Lymphatic ROS: negative for - bleeding problems or swollen lymph nodes Gastrointestinal ROS: negative for - abdominal pain, blood in stools, change in bowel habits and nausea/vomiting Musculoskeletal ROS: negative for - joint pain, muscle pain or muscular weakness Genito-Urinary ROS: negative for - change in menstrual cycle, dysmenorrhea, dyspareunia, dysuria, genital discharge, genital ulcers, hematuria, incontinence, irregular/heavy menses, nocturia or pelvic pain. Positive for menorrhagia   Objective:   Ht _0  (1.651 m)   Wt (!) 355 lb 6 oz (161.2 kg)   LMP 07/02/2019 (Exact Date)   BMI 59.14 kg/m  CONSTITUTIONAL: Well-developed, obese, well-nourished female in no acute distress.  HENT:  Normocephalic, atraumatic.  NECK: Normal range of motion, supple, no masses.  Normal thyroid.  SKIN: Skin is warm and dry. No rash noted. Not diaphoretic. No erythema. No pallor. Shannon: Alert and oriented to person, place, and time.  PSYCHIATRIC: Normal mood and affect. Normal behavior. Normal judgment and thought content. CARDIOVASCULAR:Not Examined RESPIRATORY: Not Examined BREASTS: Not Examined ABDOMEN: Soft, non distended; Non tender.  No Organomegaly. PELVIC: deferred, not indicated pt request hysterectomy.   MUSCULOSKELETAL: Normal range of motion. No tenderness.  No cyanosis, clubbing, or edema.   Assessment:   Menorrhagia  Obesity   Plan:  Discussed menorrhagia , perimenopausal state and irregular bleeding. Discussed BMI in relationship to irregular cycles. Recommend use of medications ie, lysteda, progesterone,  or replacement of IUD to help control bleeding. She states she is over this and want a hysterectomy. Reviewed that it is an invasive procedure and not  recommended as first choice treatment. She agrees to labs and u/s today. Discussed having MD visit to discuss hysterectomy . She verbalizes and agrees to plan. Follow up for u/s .    I attest more than 50% of visit spent reviewing history, discussing menorrhagia, discussing treatment options, developing a plan of care , and answering her questions. Face to face time 10 min.   Philip Aspen, CNM

## 2019-07-19 LAB — CBC
Hematocrit: 40.7 % (ref 34.0–46.6)
Hemoglobin: 13.3 g/dL (ref 11.1–15.9)
MCH: 29.8 pg (ref 26.6–33.0)
MCHC: 32.7 g/dL (ref 31.5–35.7)
MCV: 91 fL (ref 79–97)
Platelets: 249 10*3/uL (ref 150–450)
RBC: 4.47 x10E6/uL (ref 3.77–5.28)
RDW: 13.2 % (ref 11.7–15.4)
WBC: 5.3 10*3/uL (ref 3.4–10.8)

## 2019-07-19 LAB — THYROID PANEL WITH TSH
Free Thyroxine Index: 2.3 (ref 1.2–4.9)
T3 Uptake Ratio: 29 % (ref 24–39)
T4, Total: 7.9 ug/dL (ref 4.5–12.0)
TSH: 2.95 u[IU]/mL (ref 0.450–4.500)

## 2019-07-19 LAB — FSH/LH
FSH: 5.4 m[IU]/mL
LH: 3.4 m[IU]/mL

## 2019-07-20 ENCOUNTER — Other Ambulatory Visit: Payer: Self-pay

## 2019-07-20 DIAGNOSIS — Z20822 Contact with and (suspected) exposure to covid-19: Secondary | ICD-10-CM

## 2019-07-22 LAB — NOVEL CORONAVIRUS, NAA: SARS-CoV-2, NAA: NOT DETECTED

## 2019-07-23 ENCOUNTER — Other Ambulatory Visit: Payer: Self-pay

## 2019-07-23 ENCOUNTER — Ambulatory Visit
Admission: RE | Admit: 2019-07-23 | Discharge: 2019-07-23 | Disposition: A | Discharge status: Home / Self Care | Payer: Medicaid Other | Source: Ambulatory Visit | Attending: Certified Nurse Midwife | Admitting: Certified Nurse Midwife

## 2019-07-23 DIAGNOSIS — N939 Abnormal uterine and vaginal bleeding, unspecified: Secondary | ICD-10-CM | POA: Insufficient documentation

## 2019-07-23 DIAGNOSIS — N921 Excessive and frequent menstruation with irregular cycle: Secondary | ICD-10-CM | POA: Diagnosis present

## 2019-07-27 ENCOUNTER — Ambulatory Visit: Payer: Medicaid Other | Admitting: Certified Nurse Midwife

## 2019-07-31 ENCOUNTER — Emergency Department
Admission: EM | Admit: 2019-07-31 | Discharge: 2019-07-31 | Disposition: A | Discharge status: Home / Self Care | Payer: Medicaid Other | Attending: Emergency Medicine | Admitting: Emergency Medicine

## 2019-07-31 ENCOUNTER — Emergency Department: Payer: Medicaid Other

## 2019-07-31 ENCOUNTER — Other Ambulatory Visit: Payer: Self-pay

## 2019-07-31 ENCOUNTER — Encounter: Payer: Self-pay | Admitting: Emergency Medicine

## 2019-07-31 DIAGNOSIS — E119 Type 2 diabetes mellitus without complications: Secondary | ICD-10-CM | POA: Insufficient documentation

## 2019-07-31 DIAGNOSIS — R1031 Right lower quadrant pain: Secondary | ICD-10-CM | POA: Insufficient documentation

## 2019-07-31 DIAGNOSIS — Z7982 Long term (current) use of aspirin: Secondary | ICD-10-CM | POA: Insufficient documentation

## 2019-07-31 DIAGNOSIS — J45909 Unspecified asthma, uncomplicated: Secondary | ICD-10-CM | POA: Insufficient documentation

## 2019-07-31 DIAGNOSIS — R109 Unspecified abdominal pain: Secondary | ICD-10-CM

## 2019-07-31 DIAGNOSIS — Z79899 Other long term (current) drug therapy: Secondary | ICD-10-CM | POA: Insufficient documentation

## 2019-07-31 DIAGNOSIS — I10 Essential (primary) hypertension: Secondary | ICD-10-CM | POA: Insufficient documentation

## 2019-07-31 DIAGNOSIS — Z794 Long term (current) use of insulin: Secondary | ICD-10-CM | POA: Insufficient documentation

## 2019-07-31 DIAGNOSIS — Z87891 Personal history of nicotine dependence: Secondary | ICD-10-CM | POA: Diagnosis not present

## 2019-07-31 LAB — LIPASE, BLOOD: Lipase: 47 U/L (ref 11–51)

## 2019-07-31 LAB — COMPREHENSIVE METABOLIC PANEL
ALT: 31 U/L (ref 0–44)
AST: 25 U/L (ref 15–41)
Albumin: 3.8 g/dL (ref 3.5–5.0)
Alkaline Phosphatase: 80 U/L (ref 38–126)
Anion gap: 11 (ref 5–15)
BUN: 14 mg/dL (ref 6–20)
CO2: 26 mmol/L (ref 22–32)
Calcium: 8.8 mg/dL — ABNORMAL LOW (ref 8.9–10.3)
Chloride: 99 mmol/L (ref 98–111)
Creatinine, Ser: 0.9 mg/dL (ref 0.44–1.00)
GFR calc Af Amer: >60 mL/min (ref >60)
GFR calc non Af Amer: >60 mL/min (ref >60)
Glucose, Bld: 261 mg/dL — ABNORMAL HIGH (ref 70–99)
Potassium: 3.9 mmol/L (ref 3.5–5.1)
Sodium: 136 mmol/L (ref 135–145)
Total Bilirubin: 0.5 mg/dL (ref 0.3–1.2)
Total Protein: 7.5 g/dL (ref 6.5–8.1)

## 2019-07-31 LAB — URINALYSIS, COMPLETE (UACMP) WITH MICROSCOPIC
Bilirubin Urine: NEGATIVE
Glucose, UA: 50 mg/dL — AB
Hgb urine dipstick: NEGATIVE
Ketones, ur: NEGATIVE mg/dL
Leukocytes,Ua: NEGATIVE
Nitrite: NEGATIVE
Protein, ur: NEGATIVE mg/dL
Specific Gravity, Urine: 1.024 (ref 1.005–1.030)
pH: 5 (ref 5.0–8.0)

## 2019-07-31 LAB — CBC WITH DIFFERENTIAL/PLATELET
Abs Immature Granulocytes: 0.02 10*3/uL (ref 0.00–0.07)
Basophils Absolute: 0 10*3/uL (ref 0.0–0.1)
Basophils Relative: 0 %
Eosinophils Absolute: 0.1 10*3/uL (ref 0.0–0.5)
Eosinophils Relative: 1 %
HCT: 42.8 % (ref 36.0–46.0)
Hemoglobin: 13.1 g/dL (ref 12.0–15.0)
Immature Granulocytes: 0 %
Lymphocytes Relative: 28 %
Lymphs Abs: 1.5 10*3/uL (ref 0.7–4.0)
MCH: 29.2 pg (ref 26.0–34.0)
MCHC: 30.6 g/dL (ref 30.0–36.0)
MCV: 95.5 fL (ref 80.0–100.0)
Monocytes Absolute: 0.3 10*3/uL (ref 0.1–1.0)
Monocytes Relative: 6 %
Neutro Abs: 3.6 10*3/uL (ref 1.7–7.7)
Neutrophils Relative %: 65 %
Platelets: 233 10*3/uL (ref 150–400)
RBC: 4.48 MIL/uL (ref 3.87–5.11)
RDW: 13.7 % (ref 11.5–15.5)
WBC: 5.5 10*3/uL (ref 4.0–10.5)
nRBC: 0 % (ref 0.0–0.2)

## 2019-07-31 LAB — POCT PREGNANCY, URINE: Preg Test, Ur: NEGATIVE

## 2019-07-31 MED ORDER — ONDANSETRON 4 MG PO TBDP: 4.0000 mg | ORAL_TABLET | Freq: Three times a day (TID) | ORAL | 0 refills | Status: DC | PRN

## 2019-07-31 MED ORDER — KETOROLAC TROMETHAMINE 30 MG/ML IJ SOLN
30.0000 mg | Freq: Once | INTRAMUSCULAR | Status: AC
  Administered 2019-07-31: 30 mg via INTRAVENOUS
  Filled 2019-07-31: qty 1

## 2019-07-31 MED ORDER — FENTANYL CITRATE (PF) 100 MCG/2ML IJ SOLN
50.0000 ug | Freq: Once | INTRAMUSCULAR | Status: AC
  Administered 2019-07-31: 50 ug via INTRAVENOUS
  Filled 2019-07-31: qty 2

## 2019-07-31 MED ORDER — IOHEXOL 300 MG/ML  SOLN
100.0000 mL | Freq: Once | INTRAMUSCULAR | Status: AC | PRN
  Administered 2019-07-31: 10:00:00 100 mL via INTRAVENOUS

## 2019-07-31 NOTE — ED Notes (Signed)
Patient ambulatory approximately 100 feet; reports pressure to lower abdomen during ambulation.  Pt then back to room where she begins suddenly with N/V.  EDP aware.

## 2019-07-31 NOTE — ED Provider Notes (Signed)
[facility] Emergency Department Provider Note   ____________________________________________    I have reviewed the triage vital signs and the nursing notes.   HISTORY  Chief Complaint Abdominal Pain     HPI Brenda Hicks is a 53 y.o. female who presents with complaints abdominal pain.  Patient describes right lower quadrant abdominal pain that started abruptly this morning.  Yesterday she felt well.  She has not had any recent abdominal surgeries.  No history of cholecystectomy or appendectomy.  She does have diabetes.  EMS gave 50 mcg of fentanyl as well as nausea medication.  She reports the pain is improved.  Initially the pain was quite severe now it is moderate.  No radiation.  Past Medical History:  Diagnosis Date  . Anemia    vitamin d deficiency  . Arthritis   . Asthma    WELL CONTROLLED  . 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 Diabetes mellitus without complication ([place])   . Fatty liver   . Hypertension   . Kidney cysts    per patient, never had  . Renal disorder   . Sleep apnea    USES CPAP. waiting for new machine and a new sleep study  . Stroke [facility]) May or June 2019   TIA. no residual symptoms    Patient Active Problem List   Diagnosis Date Noted  . Cervical myelopathy ([place]) 08/09/2018  . DJD (degenerative joint disease) of cervical spine 08/04/2018  . 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 Ischemic chest pain ([place]) 03/29/2018  . Chest pain 03/29/2018  . Sepsis affecting skin 06/09/2017  . Sebaceous cyst 06/01/2016  . Obstructive apnea 05/31/2016  . HLD (hyperlipidemia) 05/31/2016  . Acid reflux 05/31/2016  . Essential (primary) hypertension 05/31/2016  . Abnormal Pap smear of cervix 05/31/2016  . Arthritis of knee, degenerative 07/25/2015  . History of artificial joint 07/25/2015  . Gonalgia 02/17/2015  . Type 2 diabetes mellitus ([place]) 10/23/2014  . Otalgia  of left ear 09/24/2014  . 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 Diabetic peripheral neuropathy associated with type 2 diabetes mellitus ([place]) 05/16/2014  . Endometrial polyp 11/01/2013  . Fibroids, intramural 10/09/2013  . Pain due to knee joint prosthesis ([place]) 10/05/2013  . Body mass index (BMI) of 50-59.9 in adult ([place]) 10/03/2013  . Abnormal uterine bleeding 10/03/2013  . Adiposity 10/01/2013  . Excessive and frequent menstruation with irregular cycle 10/01/2013  . Adaptive colitis 10/01/2013  . History of migraine headaches 10/01/2013  . H/O malignant neoplasm of skin 10/01/2013  . Fatty liver disease, nonalcoholic 28/76/8115  . Diverticulitis 10/01/2013  . Current tobacco use 08/29/2013  . H/O total knee replacement 08/29/2013  . Cannot sleep 08/29/2013  . Dysmenorrhea 08/29/2013  . Airway hyperreactivity 08/29/2013  . Absolute anemia 08/29/2013  . Tobacco use 08/29/2013    Past Surgical History:  Procedure Laterality Date  . ANTERIOR CERVICAL DECOMP/DISCECTOMY FUSION N/A 08/09/2018   Procedure: ANTERIOR CERVICAL DECOMPRESSION/DISCECTOMY FUSION 1 LEVEL- C4-5;  Surgeon: Meade Maw, MD;  Location: [facility] ORS;  Service: Neurosurgery;  Laterality: N/A;  . BACK SURGERY    . DILATION AND CURETTAGE OF UTERUS    . ENDOMETRIAL BIOPSY     benign  . EXPLORATORY LAPAROTOMY  1992   REMOVAL OF RUPTURED ECTOPIC  . HAND SURGERY Right 1998   cyst removed  .  HERNIA REPAIR  0017   UMBILICAL  . JOINT REPLACEMENT Right 2014   TKR  . KNEE ARTHROSCOPY Right 2012  . KNEE SURGERY Right 2014   total knee replacement  . SHOULDER ARTHROSCOPY WITH BICEPSTENOTOMY Left 12/14/2016   Procedure: SHOULDER ARTHROSCOPY WITH BICEPSTENOTOMY;  Surgeon: Corky Mull, MD;  Location: [facility] ORS;  Service: Orthopedics;  Laterality: Left;  . SHOULDER ARTHROSCOPY WITH OPEN ROTATOR CUFF REPAIR Left 12/14/2016   Procedure:  SHOULDER ARTHROSCOPY WITH OPEN ROTATOR CUFF REPAIR AND ARTHROSCOPIC ROTATOR CUFF REPAIR;  Surgeon: Corky Mull, MD;  Location: [facility] ORS;  Service: Orthopedics;  Laterality: Left;  . SHOULDER ARTHROSCOPY WITH ROTATOR CUFF REPAIR Right 01/04/2019   Procedure: SHOULDER ARTHROSCOPY WITH ROTATOR CUFF REPAIR;  Surgeon: Corky Mull, MD;  Location: [facility] ORS;  Service: Orthopedics;  Laterality: Right;  . SHOULDER ARTHROSCOPY WITH SUBACROMIAL DECOMPRESSION Left 12/14/2016   Procedure: SHOULDER ARTHROSCOPY WITH SUBACROMIAL DECOMPRESSION;  Surgeon: Corky Mull, MD;  Location: [facility] ORS;  Service: Orthopedics;  Laterality: Left;  . SHOULDER ARTHROSCOPY WITH SUBACROMIAL DECOMPRESSION AND BICEP TENDON REPAIR Right 01/04/2019   Procedure: SHOULDER ARTHROSCOPY WITH DEBRIDEMENT AND SUBACROMIAL DECOMPRESSION-RIGHT;  Surgeon: Corky Mull, MD;  Location: [facility] ORS;  Service: Orthopedics;  Laterality: Right;  . TUBAL LIGATION      Prior to Admission medications   Medication Sig Start Date End Date Taking? Authorizing Provider  Accu-Chek FastClix Lancets MISC TEST TID 04/12/19   [provider]  ACCU-CHEK GUIDE test strip U TID UTD 04/12/19   [provider]  albuterol (PROVENTIL HFA;VENTOLIN HFA) 108 (90 Base) MCG/ACT inhaler Inhale 2 puffs into the lungs every 6 (six) hours as needed for wheezing or shortness of breath.    [provider]  aspirin EC 81 MG tablet Take 81 mg by mouth daily.    [provider]  Blood Glucose Monitoring Suppl (ACCU-CHEK GUIDE) w/Device KIT U UTD TID FOR FINGERSTICK TESTING 04/10/19   [provider]  Elastic Bandages & Supports (MEDICAL COMPRESSION STOCKINGS) MISC 2 medium stockings Use as directed on both lower legs 01/30/18   Arta Silence, MD  empagliflozin (JARDIANCE) 25 MG TABS tablet Take 25 mg by mouth daily. 10/27/18   [provider]  Exenatide ER (BYDUREON) 2 MG PEN Inject 2 mg into the skin every Sunday.     [provider]  gabapentin (NEURONTIN) 300 MG capsule Take 300 mg by mouth 3 (three) times daily as needed (for neuropathy pain).     [provider]  insulin glargine (LANTUS) 100 UNIT/ML injection Inject 0.2 mLs (20 Units total) into the skin daily. Patient taking differently: Inject 100 Units into the skin at bedtime.  04/02/18   Gouru, Illene Silver, MD  insulin lispro (HUMALOG) 100 UNIT/ML injection Inject 20 Units into the skin 3 (three) times daily before meals. 05/22/18   [provider]  Insulin Syringe-Needle U-100 (INSULIN SYRINGE 1CC/31GX5/16") 31G X 5/16" 1 ML MISC USE 1 QID 03/27/19   [provider]  lisinopril (PRINIVIL,ZESTRIL) 40 MG tablet Take 40 mg by mouth every evening.  01/14/16   [provider]  naproxen sodium (ALEVE) 220 MG tablet Take 220 mg by mouth daily as needed.    [provider]  ondansetron (ZOFRAN ODT) 4 MG disintegrating tablet Take 1 tablet (4 mg total) by mouth every 8 (eight) hours as needed. 07/31/19   Lavonia Drafts, MD  oxyCODONE (OXY IR/ROXICODONE) 5 MG immediate release tablet Take 1-2 tablets (5-10 mg total) by mouth  every 4 (four) hours as needed for moderate pain ((score 4 to 6)). Patient not taking: Reported on 07/18/2019 01/04/19   Poggi, Marshall Cork, MD  promethazine (PHENERGAN) 25 MG tablet Take 1 tablet (25 mg total) by mouth every 6 (six) hours as needed for nausea or vomiting. 08/11/18   Marin Olp, PA-C  QUEtiapine (SEROQUEL) 50 MG tablet Take 50 mg by mouth at bedtime. 09/29/18   [provider]  tiZANidine (ZANAFLEX) 4 MG tablet Take 4 mg by mouth 2 (two) times daily.    [provider]  traZODone (DESYREL) 50 MG tablet Take 50 mg by mouth at bedtime as needed for sleep.  01/25/18   [provider]  Vitamin D, Ergocalciferol, (DRISDOL) 1.25 MG (50000 UT) CAPS capsule Take 50,000 Units by mouth every Monday. 04/25/18   [provider]     Allergies Morphine and related and Tramadol   Family History  Problem Relation Age of Onset  . CAD Maternal Grandmother     Social History Social History   Tobacco Use  . Smoking status: Former Smoker    Packs/day: 2.00    Years: 25.00    Pack years: 50.00    Types: Cigarettes    Quit date: 06/09/2017    Years since quitting: 2.1  . Smokeless tobacco: Never Used  Substance Use Topics  . Alcohol use: No  . Drug use: No    Review of Systems  Constitutional: No fever/chills Eyes: No visual changes.  ENT: No sore throat. Cardiovascular: Denies chest pain. Respiratory: Denies shortness of breath. Gastrointestinal: As above Genitourinary: Some discomfort with urination Musculoskeletal: Negative for back pain. Skin: Negative for rash. Neurological: Negative for headaches or weakness   ____________________________________________   PHYSICAL EXAM:  VITAL SIGNS: ED Triage Vitals [07/31/19 0901]  Enc Vitals Group     BP      Pulse      Resp      Temp 98 F (36.7 C)     Temp Source Oral     SpO2      Weight      Height      Head Circumference      Peak Flow      Pain Score      Pain Loc      Pain Edu?      Excl. in [place]?     Constitutional: Alert and oriented.  Eyes: Conjunctivae are normal.   Nose: No congestion/rhinnorhea. Mouth/Throat: Mucous membranes are moist.    Cardiovascular: Normal rate, regular rhythm. Grossly normal heart sounds.  Good peripheral circulation. Respiratory: Normal respiratory effort.  No retractions. Lungs CTAB. Gastrointestinal: Mild tenderness palpation right lower quadrant, no distention, no CVA tenderness Genitourinary: deferred Musculoskeletal:   Warm and well perfused Neurologic:  Normal speech and language. No gross focal neurologic deficits are appreciated.  Skin:  Skin is warm, dry and intact. No rash noted. Psychiatric: Mood and affect are normal. Speech and behavior are normal.  ____________________________________________   LABS (all labs ordered are listed,  but only abnormal results are displayed)  Labs Reviewed  URINALYSIS, COMPLETE (UACMP) WITH MICROSCOPIC - Abnormal; Notable for the following components:      Result Value   Color, Urine YELLOW (*)    APPearance HAZY (*)    Glucose, UA 50 (*)    Bacteria, UA RARE (*)    All other components within normal limits  COMPREHENSIVE METABOLIC PANEL - Abnormal; Notable for the following components:   Glucose, Bld  261 (*)    Calcium 8.8 (*)    All other components within normal limits  CBC WITH DIFFERENTIAL/PLATELET  LIPASE, BLOOD  POCT PREGNANCY, URINE   ____________________________________________  EKG  None ____________________________________________  RADIOLOGY  CT abdomen pelvis reassuring ____________________________________________   PROCEDURES  Procedure(s) performed: No  Procedures   Critical Care performed: No ____________________________________________   INITIAL IMPRESSION / ASSESSMENT AND PLAN / ED COURSE  Pertinent labs & imaging results that were available during my care of the patient were reviewed by me and considered in my medical decision making (see chart for details).  Patient presents with right lower quadrant pain as described above, she is feeling better after IV fentanyl.  Will need to obtain imaging to evaluate for possible appendicitis, ovarian cyst, kidney stone.  CT scan is reassuring, patient is aware of endometrial thickening, she is working with her gynecologist to determine the etiology of this.  After additional dose of pain medication patient reports he is feeling much better would like to go home.  Did offer admission however patient states she will be more comfortable at home and if her symptoms worsen she will return to the ED, I feel this is appropriate    ____________________________________________   FINAL CLINICAL IMPRESSION(S) / ED DIAGNOSES  Final diagnoses:  Abdominal pain, unspecified abdominal location         Note:  This document was prepared using Dragon voice recognition software and may include unintentional dictation errors.   Lavonia Drafts, MD 07/31/19 1355

## 2019-07-31 NOTE — ED Triage Notes (Signed)
Pt in via [facility] from home, reports sudden onset RLQ abdominal pain.  4mg  Zofran and 22mcg Fentanyl given prior to arrival via EMS.  NAD noted upon arrival.

## 2019-07-31 NOTE — ED Notes (Signed)
Patient ambulatory to toilet independently. 

## 2019-07-31 NOTE — ED Notes (Signed)
Patient transported to CT 

## 2019-08-02 IMAGING — US US EXTREM LOW VENOUS BILAT
1 series · 13 of 24 positions shown · non-contrast
Comparison: Bilateral lower extremity venous Doppler ultrasound -
01/30/2018

CLINICAL DATA: Bilateral lower extremity pain and edema. Elevated
D-dimer. Evaluate for DVT.



[Series 1: us extrem low venous bilat · 0.08mm/px · 13 of 60 slices shown]
[im 1/60]
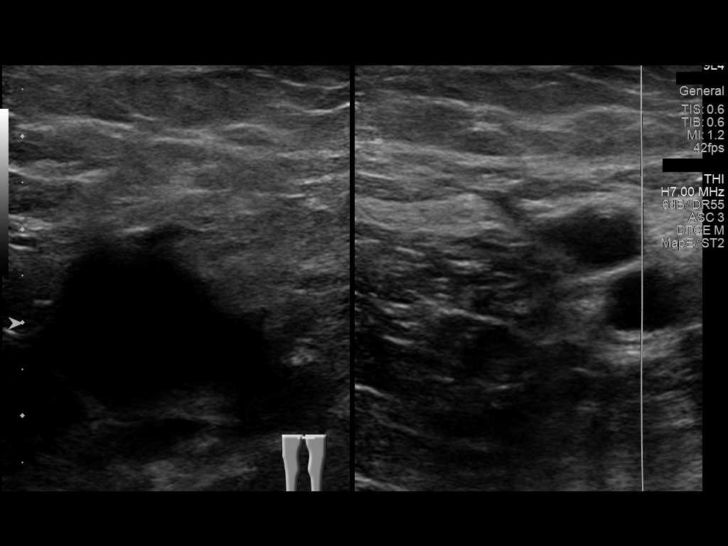
[im 6/60]
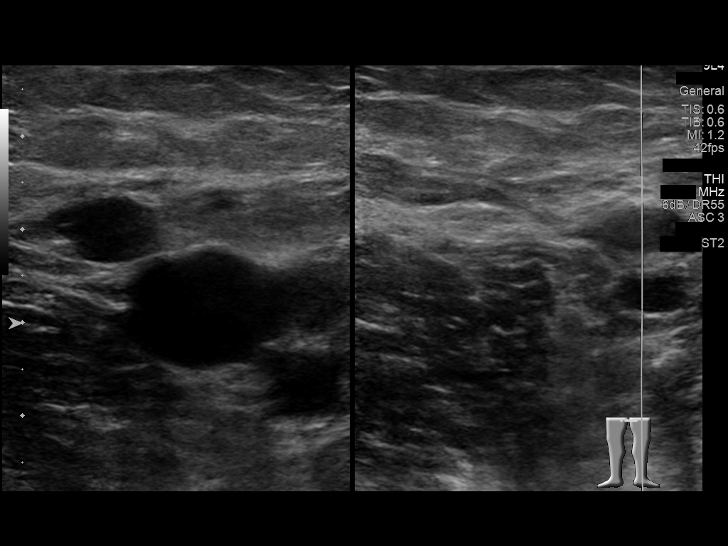
[im 11/60]
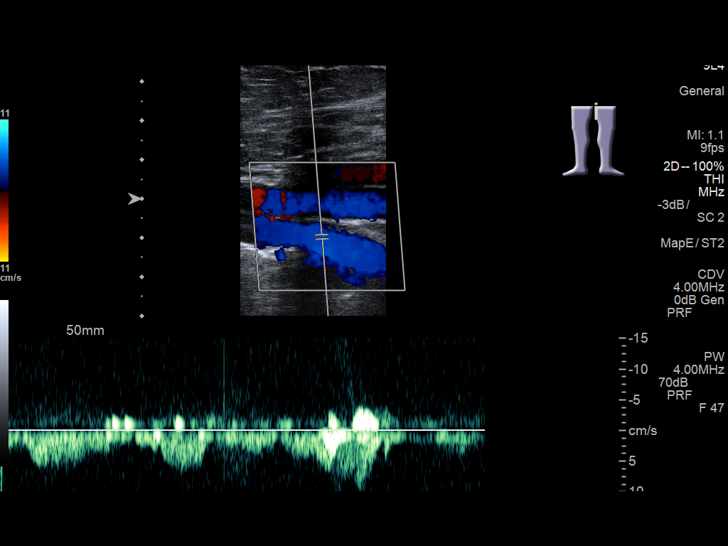
[im 16/60]
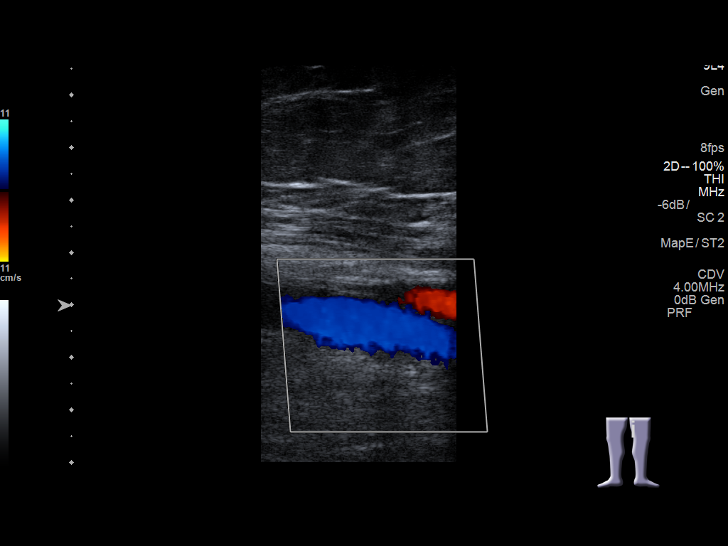
[im 21/60]
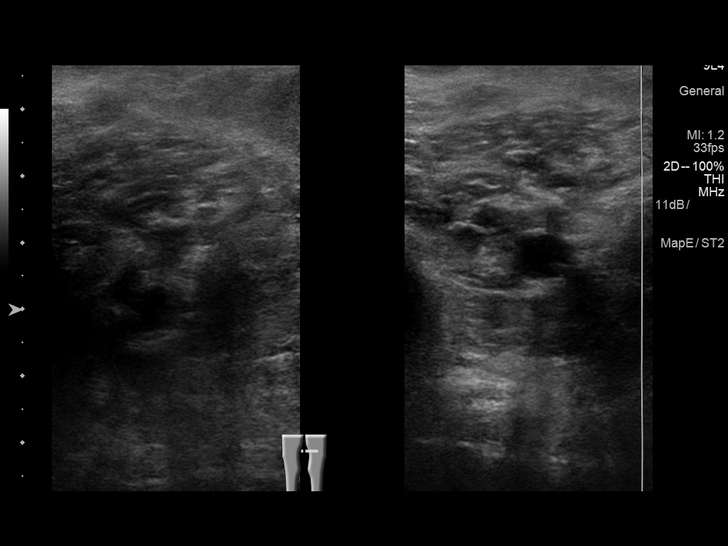
[im 26/60]
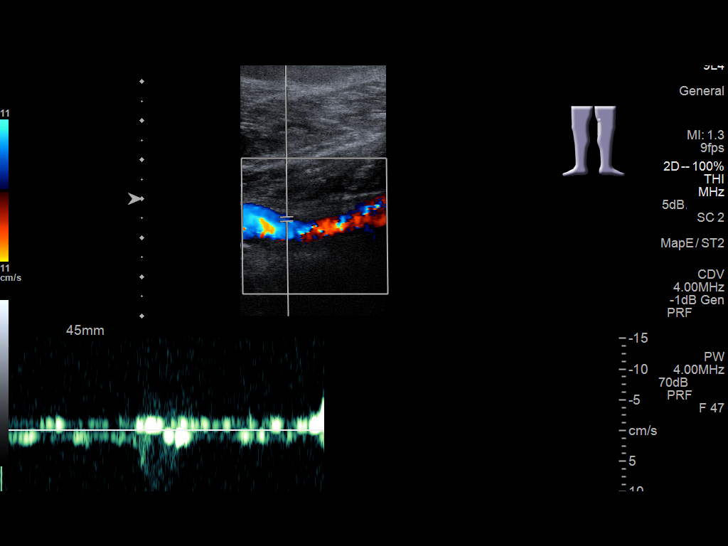
[im 31/60]
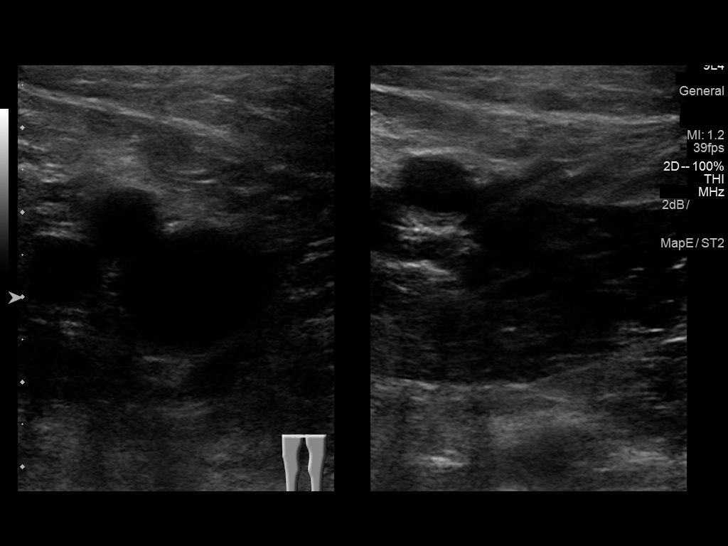
[im 34/60]
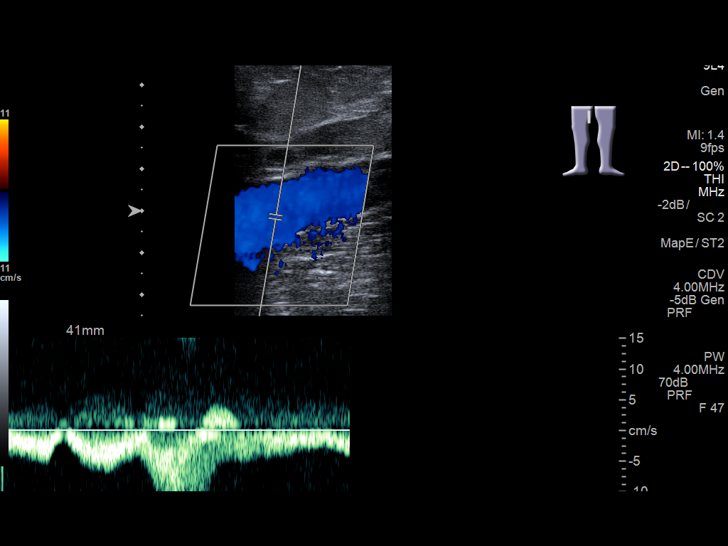
[im 39/60]
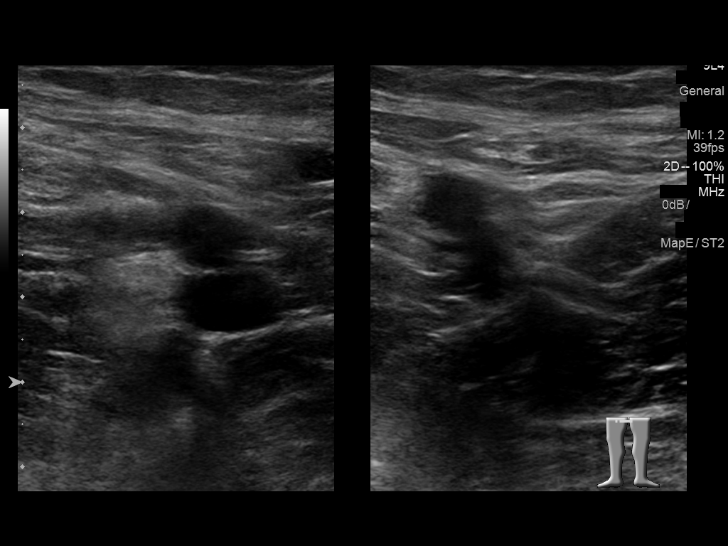
[im 44/60]
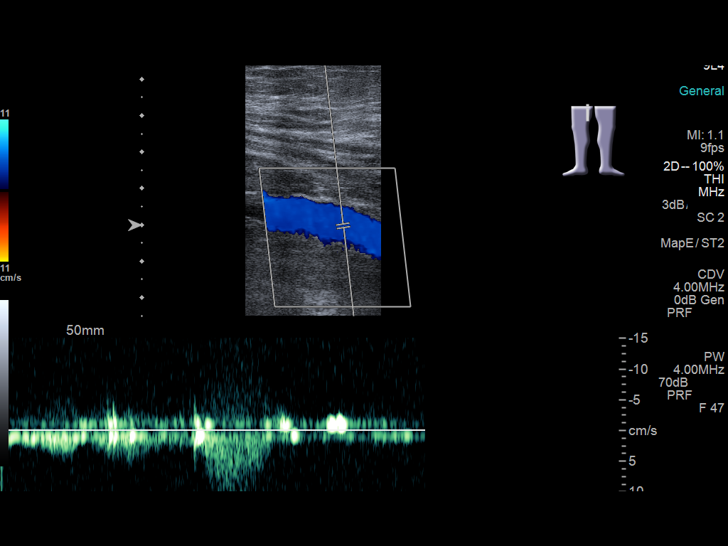
[im 49/60]
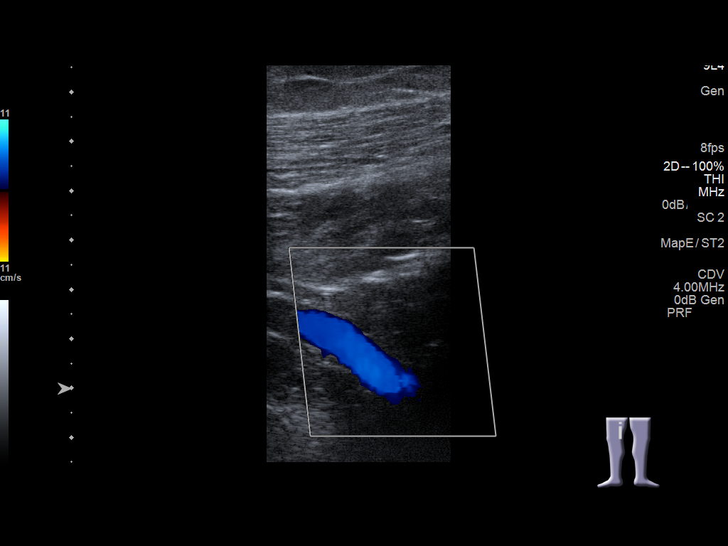
[im 54/60]
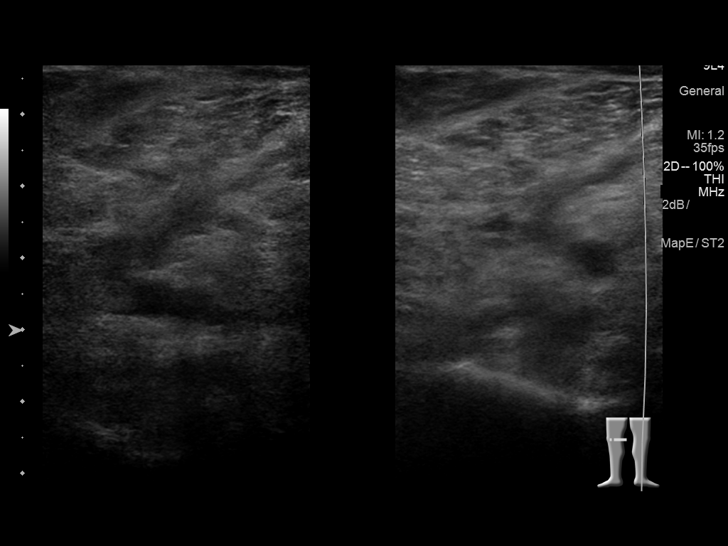
[im 60/60]
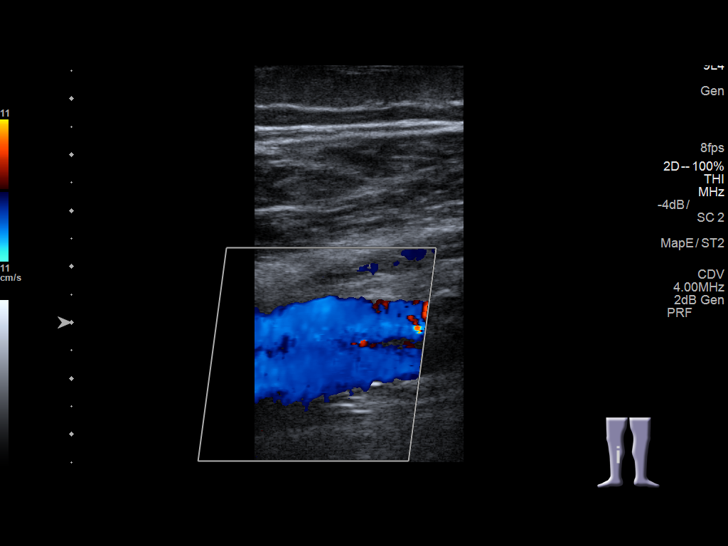

[13 of 24 positions shown; findings below may reference images not displayed]

FINDINGS: RIGHT LOWER EXTREMITY

Common Femoral Vein: No evidence of thrombus. Normal
compressibility, respiratory phasicity and response to augmentation.

Saphenofemoral Junction: No evidence of thrombus. Normal
compressibility and flow on color Doppler imaging.

Profunda Femoral Vein: No evidence of thrombus. Normal
compressibility and flow on color Doppler imaging.

Femoral Vein: No evidence of thrombus. Normal compressibility,
respiratory phasicity and response to augmentation.

Popliteal Vein: No evidence of thrombus. Normal compressibility,
respiratory phasicity and response to augmentation.

Calf Veins: No evidence of thrombus. Normal compressibility and flow
on color Doppler imaging.

Superficial Great Saphenous Vein: No evidence of thrombus. Normal
compressibility.

Venous Reflux:  None.

Other Findings:  None.

LEFT LOWER EXTREMITY

Common Femoral Vein: No evidence of thrombus. Normal
compressibility, respiratory phasicity and response to augmentation.

Saphenofemoral Junction: No evidence of thrombus. Normal
compressibility and flow on color Doppler imaging.

Profunda Femoral Vein: No evidence of thrombus. Normal
compressibility and flow on color Doppler imaging.

Femoral Vein: No evidence of thrombus. Normal compressibility,
respiratory phasicity and response to augmentation.

Popliteal Vein: No evidence of thrombus. Normal compressibility,
respiratory phasicity and response to augmentation.

Calf Veins: No evidence of thrombus. Normal compressibility and flow
on color Doppler imaging.

Superficial Great Saphenous Vein: No evidence of thrombus. Normal
compressibility.

Venous Reflux:  None.

Other Findings:  None.
IMPRESSION: No evidence of DVT within either lower extremity

## 2019-08-03 ENCOUNTER — Encounter: Payer: Self-pay | Admitting: Certified Nurse Midwife

## 2019-08-03 ENCOUNTER — Other Ambulatory Visit: Payer: Self-pay

## 2019-08-03 ENCOUNTER — Ambulatory Visit (INDEPENDENT_AMBULATORY_CARE_PROVIDER_SITE_OTHER): Payer: Medicaid Other | Admitting: Certified Nurse Midwife

## 2019-08-03 VITALS — BP 161/101 | HR 89 | Ht 65.0 in | Wt 355.0 lb

## 2019-08-03 DIAGNOSIS — Z3202 Encounter for pregnancy test, result negative: Secondary | ICD-10-CM | POA: Diagnosis not present

## 2019-08-03 DIAGNOSIS — Z30430 Encounter for insertion of intrauterine contraceptive device: Secondary | ICD-10-CM | POA: Diagnosis not present

## 2019-08-03 LAB — POCT URINE PREGNANCY: Preg Test, Ur: NEGATIVE

## 2019-08-03 NOTE — Patient Instructions (Addendum)
Intrauterine Device Information An intrauterine device (IUD) is a medical device that is inserted in the uterus to prevent pregnancy. It is a small, T-shaped device that has one or two nylon strings hanging down from it. The strings hang out of the lower part of the uterus (cervix) to allow for future IUD removal. There are two types of IUDs available:  Hormone IUD. This type of IUD is made of plastic and contains the hormone progestin (synthetic progesterone). A hormone IUD may last 3-5 years.  Copper IUD. This type of IUD has copper wire wrapped around it. A copper IUD may last up to 10 years. How is an IUD inserted? An IUD is inserted through the vagina and placed into the uterus with a minor medical procedure. The exact procedure for IUD insertion may vary among health care providers and hospitals. How does an IUD work? Synthetic progesterone in a hormonal IUD prevents pregnancy by:  Thickening cervical mucus to prevent sperm from entering the uterus.  Thinning the uterine lining to prevent a fertilized egg from being implanted there. Copper in a copper IUD prevents pregnancy by making the uterus and fallopian tubes produce a fluid that kills sperm. What are the advantages of an IUD? Advantages of either type of IUD  It is highly effective in preventing pregnancy.  It is reversible. You can become pregnant shortly after the IUD is removed.  It is low-maintenance and can stay in place for a long time.  There are no estrogen-related side effects.  It can be used when breastfeeding.  It is not associated with weight gain.  It can be inserted right after childbirth, an abortion, or a miscarriage. Advantages of a hormone IUD  If it is inserted within 7 days of your period starting, it works right after it is inserted. If the hormone IUD is inserted at any other time in your cycle, you will need to use a backup method of birth control for 7 days after insertion.  It can make  menstrual periods lighter.  It can reduce menstrual cramping.  It can be used for 3-5 years. Advantages of a copper IUD  It works right after it is inserted.  It can be used as a form of emergency birth control if it is inserted within 5 days after having unprotected sex.  It does not interfere with your body's natural hormones.  It can be used for 10 years. What are the disadvantages of an IUD?  An IUD may cause irregular menstrual bleeding for a period of time after insertion.  You may have pain during insertion and have cramping and vaginal bleeding after insertion.  An IUD may cut the uterus (uterine perforation) when it is inserted. This is rare.  An IUD may cause pelvic inflammatory disease (PID), which is an infection in the uterus and fallopian tubes. This is rare, and it usually happens during the first 20 days after the IUD is inserted.  A copper IUD can make your menstrual flow heavier and more painful. How is an IUD removed?  You will lie on your back with your knees bent and your feet in footrests (stirrups).  A device will be inserted into your vagina to spread apart the vaginal walls (speculum). This will allow your health care provider to see the strings attached to the IUD.  Your health care provider will use a small instrument (forceps) to grasp the IUD strings and pull firmly until the IUD is removed. You may have some discomfort   when the IUD is removed. Your health care provider may recommend taking over-the-counter pain relievers, such as ibuprofen, before the procedure. You may also have minor spotting for a few days after the procedure. The exact procedure for IUD removal may vary among health care providers and hospitals. Is the IUD right for me? Your health care provider will make sure you are a good candidate for an IUD and will discuss the advantages, disadvantages, and possible side effects with you. Summary  An intrauterine device (IUD) is a medical  device that is inserted in the uterus to prevent pregnancy. It is a small, T-shaped device that has one or two nylon strings hanging down from it.  A hormone IUD contains the hormone progestin (synthetic progesterone). A copper IUD has copper wire wrapped around it.  Synthetic progesterone in a hormone IUD prevents pregnancy by thickening cervical mucus and thinning the walls of the uterus. Copper in a copper IUD prevents pregnancy by making the uterus and fallopian tubes produce a fluid that kills sperm.  A hormone IUD can be left in place for 3-5 years. A copper IUD can be left in place for up to 10 years.  An IUD is inserted and removed by a health care provider. You may feel some pain during insertion and removal. Your health care provider may recommend taking over-the-counter pain medicine, such as ibuprofen, before an IUD procedure. This information is not intended to replace advice given to you by your health care provider. Make sure you discuss any questions you have with your health care provider. Document Released: 10/12/2004 Document Revised: 10/21/2017 Document Reviewed: 12/07/2016 Elsevier Patient Education  [place].  Intrauterine Device Insertion, Care After  This sheet gives you information about how to care for yourself after your procedure. Your health care provider may also give you more specific instructions. If you have problems or questions, contact your health care provider. What can I expect after the procedure? After the procedure, it is common to have: Cramps and pain in the abdomen. Light bleeding (spotting) or heavier bleeding that is like your menstrual period. This may last for up to a few days. Lower back pain. Dizziness. Headaches. Nausea. Follow these instructions at home: Before resuming sexual activity, check to make sure that you can feel the IUD string(s). You should be able to feel the end of the string(s) below the opening of your cervix. If  your IUD string is in place, you may resume sexual activity. If you had a hormonal IUD inserted more than 7 days after your most recent period started, you will need to use a backup method of birth control for 7 days after IUD insertion. Ask your health care provider whether this applies to you. Continue to check that the IUD is still in place by feeling for the string(s) after every menstrual period, or once a month. Take over-the-counter and prescription medicines only as told by your health care provider. Do not drive or use heavy machinery while taking prescription pain medicine. Keep all follow-up visits as told by your health care provider. This is important. Contact a health care provider if: You have bleeding that is heavier or lasts longer than a normal menstrual cycle. You have a fever. You have cramps or abdominal pain that get worse or do not get better with medicine. You develop abdominal pain that is new or is not in the same area of earlier cramping and pain. You feel lightheaded or weak. You have abnormal or  bad-smelling discharge from your vagina. You have pain during sexual activity. You have any of the following problems with your IUD string(s): The string bothers or hurts you or your sexual partner. You cannot feel the string. The string has gotten longer. You can feel the IUD in your vagina. You think you may be pregnant, or you miss your menstrual period. You think you may have an STI (sexually transmitted infection). Get help right away if: You have flu-like symptoms. You have a fever and chills. You can feel that your IUD has slipped out of place. Summary After the procedure, it is common to have cramps and pain in the abdomen. It is also common to have light bleeding (spotting) or heavier bleeding that is like your menstrual period. Continue to check that the IUD is still in place by feeling for the string(s) after every menstrual period, or once a month. Keep all  follow-up visits as told by your health care provider. This is important. Contact your health care provider if you have problems with your IUD string(s), such as the string getting longer or bothering you or your sexual partner. This information is not intended to replace advice given to you by your health care provider. Make sure you discuss any questions you have with your health care provider. Document Released: 07/07/2011 Document Revised: 10/21/2017 Document Reviewed: 09/29/2016 Elsevier Patient Education  2020 [employer].

## 2019-08-03 NOTE — Progress Notes (Signed)
  GYNECOLOGY OFFICE PROCEDURE NOTE  Brenda Hicks is a 53 y.o. [name] here for [name] insertion. No GYN concerns.  Last pap smear was on 01/29/19 and was normal.  IUD Insertion Procedure Note Patient identified, informed consent performed, consent signed.   Discussed risks of irregular bleeding, cramping, infection, malpositioning or misplacement of the IUD outside the uterus which may require further procedure such as laparoscopy. Also discussed >99% contraception efficacy, increased risk of ectopic pregnancy with failure of method.  Time out was performed.  Urine pregnancy test negative.  Speculum placed in the vagina.  Cervix visualized.  Cleaned with Betadine x 2.  Grasped anteriorly with a single tooth tenaculum.  Uterus sounded to 8 cm.  Mirena  IUD placed per manufacturer's recommendations.  Strings trimmed to 3 cm. Tenaculum was removed, good hemostasis noted.  Patient tolerated procedure well.   Patient was given post-procedure instructions.  She was advised to have backup contraception for one week.  Patient was also asked to check IUD strings periodically and follow up in 4 weeks for IUD check.   Philip Aspen, CNM

## 2019-08-07 ENCOUNTER — Ambulatory Visit
Admission: RE | Admit: 2019-08-07 | Discharge: 2019-08-07 | Disposition: A | Discharge status: Home / Self Care | Payer: Medicaid Other | Source: Ambulatory Visit | Attending: Family Medicine | Admitting: Family Medicine

## 2019-08-07 ENCOUNTER — Encounter: Payer: Self-pay | Admitting: Family Medicine

## 2019-08-07 ENCOUNTER — Ambulatory Visit: Payer: Medicaid Other

## 2019-08-07 ENCOUNTER — Other Ambulatory Visit: Payer: Self-pay

## 2019-08-07 ENCOUNTER — Ambulatory Visit: Payer: Medicaid Other | Admitting: Family Medicine

## 2019-08-07 VITALS — BP 132/70 | HR 92 | Temp 97.8°F | Resp 18 | Ht 65.0 in | Wt 355.2 lb

## 2019-08-07 DIAGNOSIS — M79605 Pain in left leg: Secondary | ICD-10-CM | POA: Insufficient documentation

## 2019-08-07 DIAGNOSIS — M545 Low back pain, unspecified: Secondary | ICD-10-CM | POA: Insufficient documentation

## 2019-08-07 DIAGNOSIS — Z794 Long term (current) use of insulin: Secondary | ICD-10-CM

## 2019-08-07 DIAGNOSIS — M79662 Pain in left lower leg: Secondary | ICD-10-CM | POA: Diagnosis present

## 2019-08-07 DIAGNOSIS — E782 Mixed hyperlipidemia: Secondary | ICD-10-CM

## 2019-08-07 DIAGNOSIS — E119 Type 2 diabetes mellitus without complications: Secondary | ICD-10-CM | POA: Diagnosis not present

## 2019-08-07 DIAGNOSIS — F331 Major depressive disorder, recurrent, moderate: Secondary | ICD-10-CM

## 2019-08-07 MED ORDER — INSULIN GLARGINE 100 UNIT/ML ~~LOC~~ SOLN: 100.0000 [IU] | Freq: Every day | SUBCUTANEOUS | 11 refills | Status: DC

## 2019-08-07 NOTE — Progress Notes (Signed)
Name: Brenda Hicks   MRN: [id number]    DOB: 10/12/1966   Date:08/07/2019       Progress Note  Subjective  Chief Complaint  Chief Complaint  Patient presents with  . Establish Care  . Diabetes  . Leg Pain    left leg pain  . Shoulder Pain    right    HPI  PT presents to establish care and for the following:  LEFT hip pain and LEFT leg pain: She notes pain starts in the left hip and radiates into the left leg.  She feels a pulling sensation in the posterior left upper leg.Marland Kitchen  Her LEFT foot does tend to have numbness and tingling.  She denies swelling/redness to the posterior calf, but does have pain in the posterior calf when she bears weight.   She notes LEFT foot does exhibit swelling, worse when sedentary for a long period of time.  - Has history diabetic neuropathy - on gabapentin 337m TID.  She does have tizanidine which also makes her drowsy so she avoids it.  - She does have notable history of ongoing right shoulder pain, right neck pain.  Seeing Dr. SManuella Ghaziwith neurology who did do nerve conduction studies recently and found LEFT moderate carpal tunnel. Was started on effexor for her neck pain and cervical disc disease. - Seeing Dr. YIzora Ribaswith neurosurgery for her neck pain as well; neither specialist has seen her for this hip/leg pain because it is a new problem. - We Discussed her options in detail; she has DM so we will avoid oral steroids at this time.  We will refer to ortho for lower extremity evaluation.  Diabetes mellitus type 2  - [facility] over the last [facility].  Checking sugars?  yes How often? daily Range (low to high) over last two weeks:  140-150 Does patient feel additional teaching/training would be helpful?  no  Have they attended Diabetes education classes? yes  Trying to limit white bread, white rice, white potatoes, sweets?  yes Trying to limit sweetened drinks like iced tea, soft drinks, sports drinks, fruit juices?   Still drinking apple juice.  Checking feet every day/night?  yes Last eye exam:  She is overdue and is reminded of that. Denies: Polyuria, polyphagia, vision changes; does have neuropathy. Has some polydipsia Most recent A1C:  Lab Results  Component Value Date   HGBA1C 11.8 (H) 03/29/2018    We will recheck today. Last CMP Results : is due for repeat todayUrine Micro UTD? No Current Medication Management: Diabetic Medications: Bydureon weekly on Sundays. She uses 20units TID Humalog; 100 units of Lantus; not taking her jardiance. ACEI/ARB: Yes, though she notes has not been on it for several months= Statin: No Aspirin therapy: No  Obesity: She used to weigh 600lbs, is at 355lbs now.  She lost the weight by decreasing her portions and eating healthier foods.  She is not able to exercise due to chronic pain.  She would like to discuss weight loss surgery in the future.  Depression: She has trouble sleeping, she denies SI/HI; Dr. SManuella Ghaziwith neurology did start her on Effexor and she is having some nausea and headaches but is able to tolerate.  Patient Active Problem List   Diagnosis Date Noted  . Low back pain radiating to left lower extremity 08/07/2019  . Cervical myelopathy ([place] 08/09/2018  . DJD (degenerative joint disease) of cervical spine 08/04/2018  . 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[place]  08/03/2018  . Stroke (cerebrum) ([place]) 03/30/2018  . Ischemic chest pain ([place]) 03/29/2018  . Chest pain 03/29/2018  . Sepsis affecting skin 06/09/2017  . Sebaceous cyst 06/01/2016  . Obstructive apnea 05/31/2016  . HLD (hyperlipidemia) 05/31/2016  . Acid reflux 05/31/2016  . Essential (primary) hypertension 05/31/2016  . Abnormal Pap smear of cervix 05/31/2016  . Arthritis of knee, degenerative 07/25/2015  . History of artificial joint 07/25/2015  . Gonalgia 02/17/2015  . Type 2 diabetes mellitus ([place]) 10/23/2014  . Otalgia of left ear 09/24/2014  . 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 Diabetic peripheral neuropathy associated with type 2 diabetes mellitus ([place]) 05/16/2014  . Endometrial polyp 11/01/2013  . Fibroids, intramural 10/09/2013  . Pain due to knee joint prosthesis ([place]) 10/05/2013  . Body mass index (BMI) of 50-59.9 in adult ([place]) 10/03/2013  . Abnormal uterine bleeding 10/03/2013  . Morbid obesity ([place]) 10/01/2013  . Excessive and frequent menstruation with irregular cycle 10/01/2013  . Adaptive colitis 10/01/2013  . History of migraine headaches 10/01/2013  . H/O malignant neoplasm of skin 10/01/2013  . Fatty liver disease, nonalcoholic 16/08/9603  . Diverticulitis 10/01/2013  . Current tobacco use 08/29/2013  . H/O total knee replacement 08/29/2013  . Cannot sleep 08/29/2013  . Dysmenorrhea 08/29/2013  . Airway hyperreactivity 08/29/2013  . Absolute anemia 08/29/2013  . Tobacco use 08/29/2013    Past Surgical History:  Procedure Laterality Date  . ANTERIOR CERVICAL DECOMP/DISCECTOMY FUSION N/A 08/09/2018   Procedure: ANTERIOR CERVICAL DECOMPRESSION/DISCECTOMY FUSION 1 LEVEL- C4-5;  Surgeon: Meade Maw, MD;  Location: [facility] ORS;  Service: Neurosurgery;  Laterality: N/A;  . BACK SURGERY    . DILATION AND CURETTAGE OF UTERUS    . ENDOMETRIAL BIOPSY     benign  . EXPLORATORY LAPAROTOMY  1992   REMOVAL OF RUPTURED ECTOPIC  . HAND SURGERY Right 1998   cyst removed  . HERNIA REPAIR  5409   UMBILICAL  . JOINT REPLACEMENT Right 2014   TKR  . KNEE ARTHROSCOPY Right 2012  . KNEE SURGERY Right 2014   total knee replacement  . SHOULDER ARTHROSCOPY WITH BICEPSTENOTOMY Left 12/14/2016   Procedure: SHOULDER ARTHROSCOPY WITH BICEPSTENOTOMY;  Surgeon: Corky Mull, MD;  Location: [facility] ORS;  Service: Orthopedics;  Laterality: Left;  . SHOULDER ARTHROSCOPY WITH OPEN ROTATOR CUFF REPAIR Left 12/14/2016   Procedure: SHOULDER ARTHROSCOPY WITH OPEN ROTATOR CUFF REPAIR  AND ARTHROSCOPIC ROTATOR CUFF REPAIR;  Surgeon: Corky Mull, MD;  Location: [facility] OR[facility];  Service: Orthopedics;  Laterality: Left;  . SHOULDER ARTHROSCOPY WITH ROTATOR CUFF REPAIR Right 01/04/2019   Procedure: SHOULDER ARTHROSCOPY WITH ROTATOR CUFF REPAIR;  Surgeon: Corky Mull, MD;  Location: [facility];  Service: Orthopedics;  Laterality: Right;  . SHOULDER ARTHROSCOPY WITH SUBACROMIAL DECOMPRESSION Left 12/14/2016   Procedure: SHOULDER ARTHROSCOPY WITH SUBACROMIAL DECOMPRESSION;  Surgeon: Corky Mull, MD;  Location: [facility];  Service: Orthopedics;  Laterality: Left;  . SHOULDER ARTHROSCOPY WITH SUBACROMIAL DECOMPRESSION AND BICEP TENDON REPAIR Right 01/04/2019   Procedure: SHOULDER ARTHROSCOPY WITH DEBRIDEMENT AND SUBACROMIAL DECOMPRESSION-RIGHT;  Surgeon: Corky Mull, MD;  Location: [facility];  Service: Orthopedics;  Laterality: Right;  . TUBAL LIGATION      Family History  Problem Relation Age of Onset  . CAD Maternal Grandmother     Social History   Socioeconomic History  . Marital status: Married    Spouse name: brian  . Number of children: 7  .  Years of education: Not on file  . Highest education level: Not on file  Occupational History  . Occupation: disabled    Comment: disabled  Social Needs  . Financial resource strain: Not hard at all  . Food insecurity    Worry: Never true    Inability: Never true  . Transportation needs    Medical: No    Non-medical: No  Tobacco Use  . Smoking status: Former Smoker    Packs/day: 2.00    Years: 25.00    Pack years: 50.00    Types: Cigarettes    Quit date: 06/09/2017    Years since quitting: 2.1  . Smokeless tobacco: Never Used  Substance and Sexual Activity  . Alcohol use: No  . Drug use: No  . Sexual activity: Yes    Partners: Male    Birth control/protection: I.U.D.  Lifestyle  . Physical activity    Days per week: 0 days    Minutes per session: 0 min  . Stress: Not at all  Relationships  . Social connections     Talks on phone: More than three times a week    Gets together: Never    Attends religious service: More than 4 times per year    Active member of club or organization: Yes    Attends meetings of clubs or organizations: More than 4 times per year    Relationship status: Married  . Intimate partner violence    Fear of current or ex partner: No    Emotionally abused: No    Physically abused: No    Forced sexual activity: No  Other Topics Concern  . Not on file  Social History Narrative   Have 6 biological childrean and custody of niece since 22 months.     Current Outpatient Medications:  .  albuterol (PROVENTIL HFA;VENTOLIN HFA) 108 (90 Base) MCG/ACT inhaler, Inhale 2 puffs into the lungs every 6 (six) hours as needed for wheezing or shortness of breath., Disp: , Rfl:  .  Exenatide ER (BYDUREON) 2 MG PEN, Inject 2 mg into the skin every Sunday. , Disp: , Rfl:  .  gabapentin (NEURONTIN) 300 MG capsule, Take 300 mg by mouth 3 (three) times daily as needed (for neuropathy pain). , Disp: , Rfl:  .  insulin glargine (LANTUS) 100 UNIT/ML injection, Inject 0.2 mLs (20 Units total) into the skin daily. (Patient taking differently: Inject 100 Units into the skin at bedtime. ), Disp: 10 mL, Rfl: 11 .  insulin lispro (HUMALOG) 100 UNIT/ML injection, Inject 20 Units into the skin 3 (three) times daily before meals., Disp: , Rfl:  .  lisinopril (PRINIVIL,ZESTRIL) 40 MG tablet, Take 40 mg by mouth every evening. , Disp: , Rfl:  .  promethazine (PHENERGAN) 25 MG tablet, Take 1 tablet (25 mg total) by mouth every 6 (six) hours as needed for nausea or vomiting., Disp: 20 tablet, Rfl: 0 .  tiZANidine (ZANAFLEX) 4 MG tablet, Take 4 mg by mouth 2 (two) times daily., Disp: , Rfl:  .  Vitamin D, Ergocalciferol, (DRISDOL) 1.25 MG (50000 UT) CAPS capsule, Take 50,000 Units by mouth every Monday., Disp: , Rfl:  .  Accu-Chek FastClix Lancets MISC, TEST TID, Disp: , Rfl:  .  ACCU-CHEK GUIDE test strip, U TID UTD,  Disp: , Rfl:  .  Blood Glucose Monitoring Suppl (ACCU-CHEK GUIDE) w/Device KIT, U UTD TID FOR FINGERSTICK TESTING, Disp: , Rfl:  .  Elastic Bandages & Supports (MEDICAL COMPRESSION STOCKINGS) MISC, 2  medium stockings Use as directed on both lower legs (Patient not taking: Reported on 08/07/2019), Disp: 2 each, Rfl: 0 .  Insulin Syringe-Needle U-100 (INSULIN SYRINGE 1CC/31GX5/16") 31G X 5/16" 1 ML MISC, USE 1 QID, Disp: , Rfl:  .  naproxen sodium (ALEVE) 220 MG tablet, Take 220 mg by mouth daily as needed., Disp: , Rfl:  .  QUEtiapine (SEROQUEL) 50 MG tablet, Take 50 mg by mouth at bedtime., Disp: , Rfl:  .  traZODone (DESYREL) 50 MG tablet, Take 50 mg by mouth at bedtime as needed for sleep. , Disp: , Rfl: 5 .  venlafaxine (EFFEXOR) 37.5 MG tablet, Take by mouth., Disp: , Rfl:   Allergies  Allergen Reactions  . Morphine And Related Nausea And Vomiting  . Tramadol     Nausea. If she takes antinausea medicine with this medicine, then she can tolerate it.    I personally reviewed active problem list, medication list, allergies, notes from last encounter, lab results with the patient/caregiver today.   ROS  Ten systems reviewed and is negative except as mentioned in HPI   Objective  Vitals:   08/07/19 1037  BP: 132/70  Pulse: 92  Resp: 18  Temp: 97.8 F (36.6 C)  TempSrc: Temporal  SpO2: 93%  Weight: (!) 355 lb 3.2 oz (161.1 kg)  Height: '5\' 5"'  (1.651 m)    Body mass index is 59.11 kg/m.  Physical Exam  Constitutional: Patient appears well-developed and well-nourished. No distress.  HENT: Head: Normocephalic and atraumatic.  Eyes: Conjunctivae and EOM are normal. No scleral icterus.  Neck: Normal range of motion. Neck supple. No JVD present. No thyromegaly present.  Cardiovascular: Normal rate, regular rhythm and normal heart sounds.  No murmur heard. No BLE edema. Pulmonary/Chest: Effort normal and breath sounds normal. No respiratory distress. Musculoskeletal: Normal  range of motion, no joint effusions. No gross deformities Neurological: Pt is alert and oriented to person, place, and time. No cranial nerve deficit. Coordination, balance, strength, speech and gait are normal.  Skin: Skin is warm and dry. No rash noted. No erythema.  Psychiatric: Patient has a normal mood and affect. behavior is normal. Judgment and thought content normal.  No results found for this or any previous visit (from the past 72 hour(s)).  PHQ2/9: Depression screen PHQ 2/9 08/07/2019  Decreased Interest 0  Down, Depressed, Hopeless 0  PHQ - 2 Score 0  Altered sleeping 3  Tired, decreased energy 3  Change in appetite 3  Feeling bad or failure about yourself  0  Trouble concentrating 3  Moving slowly or fidgety/restless 3  Suicidal thoughts 0  PHQ-9 Score 15  Difficult doing work/chores Somewhat difficult   PHQ-2/9 Result is positive.    Fall Risk: Fall Risk  08/07/2019  Falls in the past year? 1  Number falls in past yr: 1  Injury with Fall? 1  Follow up Falls evaluation completed   Assessment & Plan  1. Type 2 diabetes mellitus without complication, with long-term current use of insulin (HCC) - Maintain meds for now - insulin glargine (LANTUS) 100 UNIT/ML injection; Inject 1 mL (100 Units total) into the skin daily.  Dispense: 10 mL; Refill: 11 - COMPLETE METABOLIC PANEL WITH GFR - Hemoglobin A1c - Microalbumin / creatinine urine ratio  2. Low back pain radiating to left lower extremity - Flexeril PRN; taking gabapentin - Ambulatory referral to Orthopedics  3. Morbid obesity ([place]) - Discussed importance of 150 minutes of physical activity weekly, eat two servings of  fish weekly, eat one serving of tree nuts ( cashews, pistachios, pecans, almonds.Marland Kitchen) every other day, eat 6 servings of fruit/vegetables daily and drink plenty of water and avoid sweet beverages.  - COMPLETE METABOLIC PANEL WITH GFR - Lipid panel - Hemoglobin A1c - TSH  4. Mixed hyperlipidemia  - Lipid panel  5. Tenderness of left calf - US Venous Img Lower Unilateral Left; Future  6. Moderate episode of recurrent major depressive disorder ([place]) - She is taking effexor and is tapering up on this now.

## 2019-08-08 ENCOUNTER — Other Ambulatory Visit: Payer: Self-pay | Admitting: Family Medicine

## 2019-08-08 DIAGNOSIS — E1169 Type 2 diabetes mellitus with other specified complication: Secondary | ICD-10-CM | POA: Insufficient documentation

## 2019-08-08 DIAGNOSIS — Z794 Long term (current) use of insulin: Secondary | ICD-10-CM

## 2019-08-08 DIAGNOSIS — E785 Hyperlipidemia, unspecified: Secondary | ICD-10-CM

## 2019-08-08 DIAGNOSIS — E1165 Type 2 diabetes mellitus with hyperglycemia: Secondary | ICD-10-CM

## 2019-08-08 LAB — COMPLETE METABOLIC PANEL WITH GFR
AG Ratio: 1.3 (calc) (ref 1.0–2.5)
ALT: 38 U/L — ABNORMAL HIGH (ref 6–29)
AST: 28 U/L (ref 10–35)
Albumin: 4 g/dL (ref 3.6–5.1)
Alkaline phosphatase (APISO): 91 U/L (ref 37–153)
BUN: 8 mg/dL (ref 7–25)
CO2: 29 mmol/L (ref 20–32)
Calcium: 9.3 mg/dL (ref 8.6–10.4)
Chloride: 103 mmol/L (ref 98–110)
Creat: 0.85 mg/dL (ref 0.50–1.05)
GFR, Est African American: 92 mL/min/{1.73_m2} (ref >=60)
GFR, Est Non African American: 79 mL/min/{1.73_m2} (ref >=60)
Globulin: 3 g/dL (calc) (ref 1.9–3.7)
Glucose, Bld: 224 mg/dL — ABNORMAL HIGH (ref 65–99)
Potassium: 4.3 mmol/L (ref 3.5–5.3)
Sodium: 138 mmol/L (ref 135–146)
Total Bilirubin: 0.4 mg/dL (ref 0.2–1.2)
Total Protein: 7 g/dL (ref 6.1–8.1)

## 2019-08-08 LAB — HEMOGLOBIN A1C
Hgb A1c MFr Bld: 10 % of total Hgb — ABNORMAL HIGH (ref <5.7)
Mean Plasma Glucose: 240 (calc)
eAG (mmol/L): 13.3 (calc)

## 2019-08-08 LAB — LIPID PANEL
Cholesterol: 169 mg/dL (ref <200)
HDL: 31 mg/dL — ABNORMAL LOW (ref >=50)
LDL Cholesterol (Calc): 115 mg/dL (calc) — ABNORMAL HIGH
Non-HDL Cholesterol (Calc): 138 mg/dL (calc) — ABNORMAL HIGH (ref <130)
Total CHOL/HDL Ratio: 5.5 (calc) — ABNORMAL HIGH (ref <5.0)
Triglycerides: 119 mg/dL (ref <150)

## 2019-08-08 LAB — MICROALBUMIN / CREATININE URINE RATIO
Creatinine, Urine: 234 mg/dL (ref 20–275)
Microalb Creat Ratio: 5 mcg/mg creat (ref <30)
Microalb, Ur: 1.1 mg/dL

## 2019-08-08 LAB — TSH: TSH: 2.91 mIU/L

## 2019-08-08 MED ORDER — ATORVASTATIN CALCIUM 20 MG PO TABS: 20.0000 mg | ORAL_TABLET | Freq: Every day | ORAL | 3 refills | Status: DC

## 2019-08-11 ENCOUNTER — Encounter: Payer: Self-pay | Admitting: Family Medicine

## 2019-08-13 ENCOUNTER — Encounter: Payer: Self-pay | Admitting: Family Medicine

## 2019-08-14 ENCOUNTER — Encounter: Payer: Self-pay | Admitting: Emergency Medicine

## 2019-08-14 ENCOUNTER — Emergency Department: Payer: Medicaid Other

## 2019-08-14 ENCOUNTER — Other Ambulatory Visit: Payer: Self-pay

## 2019-08-14 ENCOUNTER — Emergency Department
Admission: EM | Admit: 2019-08-14 | Discharge: 2019-08-14 | Disposition: A | Discharge status: Home / Self Care | Payer: Medicaid Other | Attending: Student in an Organized Health Care Education/Training Program | Admitting: Student in an Organized Health Care Education/Training Program

## 2019-08-14 DIAGNOSIS — N939 Abnormal uterine and vaginal bleeding, unspecified: Secondary | ICD-10-CM | POA: Diagnosis present

## 2019-08-14 DIAGNOSIS — Z79899 Other long term (current) drug therapy: Secondary | ICD-10-CM | POA: Diagnosis not present

## 2019-08-14 DIAGNOSIS — I10 Essential (primary) hypertension: Secondary | ICD-10-CM | POA: Insufficient documentation

## 2019-08-14 DIAGNOSIS — Z87891 Personal history of nicotine dependence: Secondary | ICD-10-CM | POA: Diagnosis not present

## 2019-08-14 DIAGNOSIS — Z85828 Personal history of other malignant neoplasm of skin: Secondary | ICD-10-CM | POA: Diagnosis not present

## 2019-08-14 DIAGNOSIS — Z794 Long term (current) use of insulin: Secondary | ICD-10-CM | POA: Insufficient documentation

## 2019-08-14 DIAGNOSIS — J45909 Unspecified asthma, uncomplicated: Secondary | ICD-10-CM | POA: Diagnosis not present

## 2019-08-14 DIAGNOSIS — E119 Type 2 diabetes mellitus without complications: Secondary | ICD-10-CM | POA: Insufficient documentation

## 2019-08-14 LAB — CBC WITH DIFFERENTIAL/PLATELET
Abs Immature Granulocytes: 0.04 10*3/uL (ref 0.00–0.07)
Basophils Absolute: 0 10*3/uL (ref 0.0–0.1)
Basophils Relative: 0 %
Eosinophils Absolute: 0.1 10*3/uL (ref 0.0–0.5)
Eosinophils Relative: 1 %
HCT: 37.7 % (ref 36.0–46.0)
Hemoglobin: 11.7 g/dL — ABNORMAL LOW (ref 12.0–15.0)
Immature Granulocytes: 1 %
Lymphocytes Relative: 34 %
Lymphs Abs: 2.2 10*3/uL (ref 0.7–4.0)
MCH: 30.2 pg (ref 26.0–34.0)
MCHC: 31 g/dL (ref 30.0–36.0)
MCV: 97.4 fL (ref 80.0–100.0)
Monocytes Absolute: 0.5 10*3/uL (ref 0.1–1.0)
Monocytes Relative: 8 %
Neutro Abs: 3.7 10*3/uL (ref 1.7–7.7)
Neutrophils Relative %: 56 %
Platelets: 279 10*3/uL (ref 150–400)
RBC: 3.87 MIL/uL (ref 3.87–5.11)
RDW: 14 % (ref 11.5–15.5)
WBC: 6.6 10*3/uL (ref 4.0–10.5)
nRBC: 0.3 % — ABNORMAL HIGH (ref 0.0–0.2)

## 2019-08-14 LAB — COMPREHENSIVE METABOLIC PANEL
ALT: 32 U/L (ref 0–44)
AST: 30 U/L (ref 15–41)
Albumin: 3.8 g/dL (ref 3.5–5.0)
Alkaline Phosphatase: 68 U/L (ref 38–126)
Anion gap: 10 (ref 5–15)
BUN: 13 mg/dL (ref 6–20)
CO2: 26 mmol/L (ref 22–32)
Calcium: 9.2 mg/dL (ref 8.9–10.3)
Chloride: 103 mmol/L (ref 98–111)
Creatinine, Ser: 1.02 mg/dL — ABNORMAL HIGH (ref 0.44–1.00)
GFR calc Af Amer: >60 mL/min (ref >60)
GFR calc non Af Amer: >60 mL/min (ref >60)
Glucose, Bld: 135 mg/dL — ABNORMAL HIGH (ref 70–99)
Potassium: 4.5 mmol/L (ref 3.5–5.1)
Sodium: 139 mmol/L (ref 135–145)
Total Bilirubin: 0.6 mg/dL (ref 0.3–1.2)
Total Protein: 7 g/dL (ref 6.5–8.1)

## 2019-08-14 MED ORDER — ESTRADIOL 1 MG PO TABS: 1.0000 mg | ORAL_TABLET | Freq: Every day | ORAL | 0 refills | Status: DC

## 2019-08-14 NOTE — ED Provider Notes (Signed)
[facility] Emergency Department Provider Note    First MD Initiated Contact with Patient 08/14/19 1308     (approximate)  I have reviewed the triage vital signs and the nursing notes.   HISTORY  Chief Complaint Vaginal Bleeding    HPI Brenda Hicks is a 53 y.o. female with a history of DU B presents to the ER for evaluation of heavy vaginal bleeding.  Just had a Mirena IUD placed in clinic 2 weeks ago.  States that she did have spotting a few days after but is started having more heavy bleeding for the past 24 hours.  Since arrival to the ER her bleeding has subsided.  She is not on any blood thinners.  Is having some crampy discomfort.    Past Medical History:  Diagnosis Date  . Anemia    vitamin d deficiency  . Arthritis   . Asthma    WELL CONTROLLED  . 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 Diabetes mellitus without complication ([place])   . Fatty liver   . Hypertension   . Kidney cysts    per patient, never had  . Renal disorder   . Sleep apnea    USES CPAP. waiting for new machine and a new sleep study  . Stroke [facility]) May or June 2019   TIA. no residual symptoms   Family History  Problem Relation Age of Onset  . CAD Maternal Grandmother    Past Surgical History:  Procedure Laterality Date  . ANTERIOR CERVICAL DECOMP/DISCECTOMY FUSION N/A 08/09/2018   Procedure: ANTERIOR CERVICAL DECOMPRESSION/DISCECTOMY FUSION 1 LEVEL- C4-5;  Surgeon: Meade Maw, MD;  Location: [facility] ORS;  Service: Neurosurgery;  Laterality: N/A;  . BACK SURGERY    . DILATION AND CURETTAGE OF UTERUS    . ENDOMETRIAL BIOPSY     benign  . EXPLORATORY LAPAROTOMY  1992   REMOVAL OF RUPTURED ECTOPIC  . HAND SURGERY Right 1998   cyst removed  . HERNIA REPAIR  1245   UMBILICAL  . JOINT REPLACEMENT Right 2014   TKR  . KNEE ARTHROSCOPY Right 2012  . KNEE SURGERY Right 2014   total knee replacement  . SHOULDER ARTHROSCOPY WITH BICEPSTENOTOMY Left 12/14/2016   Procedure: SHOULDER ARTHROSCOPY WITH BICEPSTENOTOMY;  Surgeon: Corky Mull, MD;  Location: [facility] ORS;  Service: Orthopedics;  Laterality: Left;  . SHOULDER ARTHROSCOPY WITH OPEN ROTATOR CUFF REPAIR Left 12/14/2016   Procedure: SHOULDER ARTHROSCOPY WITH OPEN ROTATOR CUFF REPAIR AND ARTHROSCOPIC ROTATOR CUFF REPAIR;  Surgeon: Corky Mull, MD;  Location: [facility] ORS;  Service: Orthopedics;  Laterality: Left;  . SHOULDER ARTHROSCOPY WITH ROTATOR CUFF REPAIR Right 01/04/2019   Procedure: SHOULDER ARTHROSCOPY WITH ROTATOR CUFF REPAIR;  Surgeon: Corky Mull, MD;  Location: [facility] ORS;  Service: Orthopedics;  Laterality: Right;  . SHOULDER ARTHROSCOPY WITH SUBACROMIAL DECOMPRESSION Left 12/14/2016   Procedure: SHOULDER ARTHROSCOPY WITH SUBACROMIAL DECOMPRESSION;  Surgeon: Corky Mull, MD;  Location: [facility] ORS;  Service: Orthopedics;  Laterality: Left;  . SHOULDER ARTHROSCOPY WITH SUBACROMIAL DECOMPRESSION AND BICEP TENDON REPAIR Right 01/04/2019   Procedure: SHOULDER ARTHROSCOPY WITH DEBRIDEMENT AND SUBACROMIAL DECOMPRESSION-RIGHT;  Surgeon: Corky Mull, MD;  Location: [facility] ORS;  Service: Orthopedics;  Laterality: Right;  . TUBAL LIGATION     Patient Active Problem List   Diagnosis Date Noted  . Hyperlipidemia associated with type 2 diabetes mellitus ([place]) 08/08/2019  . Low back pain radiating to left lower extremity 08/07/2019  . Cervical  myelopathy ([place]) 08/09/2018  . DJD (degenerative joint disease) of cervical spine 08/04/2018  . 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[place]) 08/03/2018  . Stroke (cerebrum) ([place]) 03/30/2018  . Ischemic chest pain ([place]) 03/29/2018  . Chest pain 03/29/2018  . Sepsis affecting skin 06/09/2017  . Sebaceous cyst 06/01/2016  . Obstructive apnea 05/31/2016  . HLD (hyperlipidemia) 05/31/2016  . Acid reflux 05/31/2016  . Essential (primary) hypertension 05/31/2016  . Abnormal Pap smear of cervix 05/31/2016  . Arthritis of knee, degenerative 07/25/2015  . History of artificial  joint 07/25/2015  . Gonalgia 02/17/2015  . Type 2 diabetes mellitus ([place]) 10/23/2014  . Otalgia of left ear 09/24/2014  . 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 Diabetic peripheral neuropathy associated with type 2 diabetes mellitus ([place]) 05/16/2014  . Endometrial polyp 11/01/2013  . Fibroids, intramural 10/09/2013  . Pain due to knee joint prosthesis ([place]) 10/05/2013  . Body mass index (BMI) of 50-59.9 in adult ([place]) 10/03/2013  . Abnormal uterine bleeding 10/03/2013  . Morbid obesity ([place]) 10/01/2013  . Excessive and frequent menstruation with irregular cycle 10/01/2013  . Adaptive colitis 10/01/2013  . History of migraine headaches 10/01/2013  . H/O malignant neoplasm of skin 10/01/2013  . Fatty liver disease, nonalcoholic 32/54/9826  . Diverticulitis 10/01/2013  . Current tobacco use 08/29/2013  . H/O total knee replacement 08/29/2013  . Cannot sleep 08/29/2013  . Dysmenorrhea 08/29/2013  . Airway hyperreactivity 08/29/2013  . Absolute anemia 08/29/2013  . Tobacco use 08/29/2013      Prior to Admission medications   Medication Sig Start Date End Date Taking? Authorizing Provider  Accu-Chek FastClix Lancets MISC TEST TID 04/12/19   [provider]  ACCU-CHEK GUIDE test strip U TID UTD 04/12/19   [provider]  albuterol (PROVENTIL HFA;VENTOLIN HFA) 108 (90 Base) MCG/ACT inhaler Inhale 2 puffs into the lungs every 6 (six) hours as needed for wheezing or shortness of breath.    [provider]  atorvastatin (LIPITOR) 20 MG tablet Take 1 tablet (20 mg total) by mouth daily. 08/08/19   Hubbard Hartshorn, FNP  Blood Glucose Monitoring Suppl (ACCU-CHEK GUIDE) w/Device KIT U UTD TID FOR FINGERSTICK TESTING 04/10/19   [provider]  Elastic Bandages & Supports (MEDICAL COMPRESSION STOCKINGS) MISC 2 medium stockings Use as directed on both lower legs Patient not  taking: Reported on 08/07/2019 01/30/18   Arta Silence, MD  estradiol (ESTRACE) 1 MG tablet Take 1 tablet (1 mg total) by mouth daily. 08/14/19 08/13/20  Merlyn Lot, MD  Exenatide ER (BYDUREON) 2 MG PEN Inject 2 mg into the skin every Sunday.     [provider]  gabapentin (NEURONTIN) 300 MG capsule Take 300 mg by mouth 3 (three) times daily as needed (for neuropathy pain).     [provider]  insulin glargine (LANTUS) 100 UNIT/ML injection Inject 1 mL (100 Units total) into the skin daily. 08/07/19   Hubbard Hartshorn, FNP  insulin lispro (HUMALOG) 100 UNIT/ML injection Inject 20 Units into the skin 3 (three) times daily before meals. 05/22/18   [provider]  Insulin Syringe-Needle U-100 (INSULIN SYRINGE 1CC/31GX5/16") 31G X 5/16" 1 ML MISC USE 1 QID 03/27/19   [provider]  naproxen sodium (ALEVE) 220 MG tablet Take 220 mg by mouth daily as needed.    [provider]  promethazine (PHENERGAN) 25 MG tablet Take 1 tablet (25 mg total) by mouth every 6 (six)  hours as needed for nausea or vomiting. 08/11/18   Marin Olp, PA-C  QUEtiapine (SEROQUEL) 50 MG tablet Take 50 mg by mouth at bedtime. 09/29/18   [provider]  tiZANidine (ZANAFLEX) 4 MG tablet Take 4 mg by mouth 2 (two) times daily.    [provider]  traZODone (DESYREL) 50 MG tablet Take 50 mg by mouth at bedtime as needed for sleep.  01/25/18   [provider]  venlafaxine (EFFEXOR) 37.5 MG tablet Take by mouth. 07/24/19 08/23/19  [provider]  Vitamin D, Ergocalciferol, (DRISDOL) 1.25 MG (50000 UT) CAPS capsule Take 50,000 Units by mouth every Monday. 04/25/18   [provider]    Allergies Morphine and related and Tramadol    Social History Social History   Tobacco Use  . Smoking status: Former Smoker    Packs/day: 2.00    Years: 25.00    Pack years: 50.00    Types: Cigarettes    Quit date: 06/09/2017    Years since quitting:  2.1  . Smokeless tobacco: Never Used  Substance Use Topics  . Alcohol use: No  . Drug use: No    Review of Systems Patient denies headaches, rhinorrhea, blurry vision, numbness, shortness of breath, chest pain, edema, cough, abdominal pain, nausea, vomiting, diarrhea, dysuria, fevers, rashes or hallucinations unless otherwise stated above in HPI. ____________________________________________   PHYSICAL EXAM:  VITAL SIGNS: Vitals:   08/14/19 1348 08/14/19 1505  BP: 132/75 135/80  Pulse: 82 80  Resp: 18 19  Temp:    SpO2: 98% 100%    Constitutional: Alert and oriented.  Eyes: Conjunctivae are normal.  Head: Atraumatic. Nose: No congestion/rhinnorhea. Mouth/Throat: Mucous membranes are moist.   Neck: No stridor. Painless ROM.  Cardiovascular: Normal rate, regular rhythm. Grossly normal heart sounds.  Good peripheral circulation. Respiratory: Normal respiratory effort.  No retractions. Lungs CTAB. Gastrointestinal: Soft and nontender. No distention. No abdominal bruits. No CVA tenderness. Genitourinary: No significant bleeding.  IUD strings are visible. Musculoskeletal: No lower extremity tenderness nor edema.  No joint effusions. Neurologic:  Normal speech and language. No gross focal neurologic deficits are appreciated. No facial droop Skin:  Skin is warm, dry and intact. No rash noted. Psychiatric: Mood and affect are normal. Speech and behavior are normal.  ____________________________________________   LABS (all labs ordered are listed, but only abnormal results are displayed)  Results for orders placed or performed during the hospital encounter of 08/14/19 (from the past 24 hour(s))  CBC with Differential     Status: Abnormal   Collection Time: 08/14/19 11:37 AM  Result Value Ref Range   WBC 6.6 4.0 - 10.5 K/uL   RBC 3.87 3.87 - 5.11 MIL/uL   Hemoglobin 11.7 (L) 12.0 - 15.0 g/dL   HCT 37.7 36.0 - 46.0 %   MCV 97.4 80.0 - 100.0 fL   MCH 30.2 26.0 - 34.0 pg   MCHC  31.0 30.0 - 36.0 g/dL   RDW 14.0 11.5 - 15.5 %   Platelets 279 150 - 400 K/uL   nRBC 0.3 (H) 0.0 - 0.2 %   Neutrophils Relative % 56 %   Neutro Abs 3.7 1.7 - 7.7 K/uL   Lymphocytes Relative 34 %   Lymphs Abs 2.2 0.7 - 4.0 K/uL   Monocytes Relative 8 %   Monocytes Absolute 0.5 0.1 - 1.0 K/uL   Eosinophils Relative 1 %   Eosinophils Absolute 0.1 0.0 - 0.5 K/uL   Basophils Relative 0 %   Basophils  Absolute 0.0 0.0 - 0.1 K/uL   Immature Granulocytes 1 %   Abs Immature Granulocytes 0.04 0.00 - 0.07 K/uL  Comprehensive metabolic panel     Status: Abnormal   Collection Time: 08/14/19 11:37 AM  Result Value Ref Range   Sodium 139 135 - 145 mmol/L   Potassium 4.5 3.5 - 5.1 mmol/L   Chloride 103 98 - 111 mmol/L   CO2 26 22 - 32 mmol/L   Glucose, Bld 135 (H) 70 - 99 mg/dL   BUN 13 6 - 20 mg/dL   Creatinine, Ser 1.02 (H) 0.44 - 1.00 mg/dL   Calcium 9.2 8.9 - 10.3 mg/dL   Total Protein 7.0 6.5 - 8.1 g/dL   Albumin 3.8 3.5 - 5.0 g/dL   AST 30 15 - 41 U/L   ALT 32 0 - 44 U/L   Alkaline Phosphatase 68 38 - 126 U/L   Total Bilirubin 0.6 0.3 - 1.2 mg/dL   GFR calc non Af Amer >60 >60 mL/min   GFR calc Af Amer >60 >60 mL/min   Anion gap 10 5 - 15   ____________________________________________ ____________________________________________  RADIOLOGY  I personally reviewed all radiographic images ordered to evaluate for the above acute complaints and reviewed radiology reports and findings.  These findings were personally discussed with the patient.  Please see medical record for radiology report.  ____________________________________________   PROCEDURES  Procedure(s) performed:  Procedures    Critical Care performed: no ____________________________________________   INITIAL IMPRESSION / ASSESSMENT AND PLAN / ED COURSE  Pertinent labs & imaging results that were available during my care of the patient were reviewed by me and considered in my medical decision making (see chart  for details).   DDX: DVT, ABD, perforation, mass, laceration  Latausha CICI RODRIGES is a 53 y.o. who presents to the ED with vaginal bleeding in the setting of recently placed IUD.  Patient hemodynamically stable nontoxic and in no acute distress.  Clinical Course as of Aug 13 1509  Tue Aug 14, 2019  1348 Pelvic exam with scant blood in the vaginal vault.  Able to see the strings from IUD.  Speculum exam somewhat limited due to obesity.  Will order ultrasound to further evaluate for above complaints.   [PR]  4680 Discussed case with Dr. Marcelline Mates of OB/GYN who agrees to see patient close outpatient clinic.  Has recommended initiating estradiol OCP to help control the bleeding.  Discussed risk and benefits of OCP versus Lysteda with the patient and she agrees to initiating OCP and has tolerated it in the past.  Have discussed with the patient and available family all diagnostics and treatments performed thus far and all questions were answered to the best of my ability. The patient demonstrates understanding and agreement with plan.    [PR]    Clinical Course User Index [PR] Merlyn Lot, MD    The patient was evaluated in Emergency Department today for the symptoms described in the history of present illness. He/she was evaluated in the context of the global COVID-19 pandemic, which necessitated consideration that the patient might be at risk for infection with the SARS-CoV-2 virus that causes COVID-19. Institutional protocols and algorithms that pertain to the evaluation of patients at risk for COVID-19 are in a state of rapid change based on information released by regulatory bodies including the CDC and federal and state organizations. These policies and algorithms were followed during the patient's care in the ED.  As part of my medical decision making,  I reviewed the following data within the [place] notes reviewed and incorporated, Labs reviewed, notes from prior ED  visits and  Controlled Substance Database   ____________________________________________   FINAL CLINICAL IMPRESSION(S) / ED DIAGNOSES  Final diagnoses:  Vaginal bleeding      NEW MEDICATIONS STARTED DURING THIS VISIT:  New Prescriptions   ESTRADIOL (ESTRACE) 1 MG TABLET    Take 1 tablet (1 mg total) by mouth daily.     Note:  This document was prepared using Dragon voice recognition software and may include unintentional dictation errors.    Merlyn Lot, MD 08/14/19 [phone]

## 2019-08-14 NOTE — ED Triage Notes (Signed)
PT reports vaginal bleeding since IUD placement 2 weeks prior. Pt also reports lower abdominal cramping and weakness.

## 2019-08-14 NOTE — ED Triage Notes (Signed)
Says had iud a coulple weeks ago and now has excessive vaginal bleeding

## 2019-08-22 ENCOUNTER — Encounter: Payer: Self-pay | Admitting: Family Medicine

## 2019-08-30 ENCOUNTER — Ambulatory Visit: Payer: Medicaid Other | Admitting: Family Medicine

## 2019-08-30 ENCOUNTER — Other Ambulatory Visit: Payer: Self-pay

## 2019-08-30 ENCOUNTER — Encounter: Payer: Self-pay | Admitting: Family Medicine

## 2019-08-30 VITALS — BP 130/84 | HR 100 | Temp 97.9°F | Resp 18 | Ht 65.0 in | Wt 360.6 lb

## 2019-08-30 DIAGNOSIS — N939 Abnormal uterine and vaginal bleeding, unspecified: Secondary | ICD-10-CM | POA: Diagnosis not present

## 2019-08-30 DIAGNOSIS — E785 Hyperlipidemia, unspecified: Secondary | ICD-10-CM

## 2019-08-30 DIAGNOSIS — G4733 Obstructive sleep apnea (adult) (pediatric): Secondary | ICD-10-CM

## 2019-08-30 DIAGNOSIS — Z8673 Personal history of transient ischemic attack (TIA), and cerebral infarction without residual deficits: Secondary | ICD-10-CM

## 2019-08-30 DIAGNOSIS — Z6841 Body Mass Index (BMI) 40.0 and over, adult: Secondary | ICD-10-CM

## 2019-08-30 DIAGNOSIS — K760 Fatty (change of) liver, not elsewhere classified: Secondary | ICD-10-CM

## 2019-08-30 DIAGNOSIS — E1165 Type 2 diabetes mellitus with hyperglycemia: Secondary | ICD-10-CM

## 2019-08-30 DIAGNOSIS — N946 Dysmenorrhea, unspecified: Secondary | ICD-10-CM

## 2019-08-30 DIAGNOSIS — E1169 Type 2 diabetes mellitus with other specified complication: Secondary | ICD-10-CM

## 2019-08-30 DIAGNOSIS — Z794 Long term (current) use of insulin: Secondary | ICD-10-CM

## 2019-08-30 NOTE — Patient Instructions (Addendum)
-   Endocrinology: [phone] - call to schedule an appointment. - Call to schedule your follow up with Dr. Candelaria Stagers.

## 2019-08-30 NOTE — Progress Notes (Deleted)
Name: Brenda Hicks   MRN: [id number]    DOB: 1966-03-25   Date:08/30/2019       Progress Note  Subjective  Chief Complaint  No chief complaint on file.   HPI  Pt presents for 2 week follow up for chronic disease management.  She was new to me at her last visit, and we discussed left hip/leg pain, DMT2, obesity, and depression.  Today we will address the additional following concerns:  HLD:  OSA:  History Stroke Patient Active Problem List   Diagnosis Date Noted  . Hyperlipidemia associated with type 2 diabetes mellitus ([place]) 08/08/2019  . Low back pain radiating to left lower extremity 08/07/2019  . Cervical myelopathy ([place]) 08/09/2018  . DJD (degenerative joint disease) of cervical spine 08/04/2018  . 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[place]) 08/03/2018  . Stroke (cerebrum) ([place]) 03/30/2018  . Ischemic chest pain ([place]) 03/29/2018  . Chest pain 03/29/2018  . Sepsis affecting skin 06/09/2017  . Sebaceous cyst 06/01/2016  . Obstructive apnea 05/31/2016  . HLD (hyperlipidemia) 05/31/2016  . Acid reflux 05/31/2016  . Essential (primary) hypertension 05/31/2016  . Abnormal Pap smear of cervix 05/31/2016  . Arthritis of knee, degenerative 07/25/2015  . History of artificial joint 07/25/2015  . Gonalgia 02/17/2015  . Type 2 diabetes mellitus ([place]) 10/23/2014  . Otalgia of left ear 09/24/2014  . 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 Diabetic peripheral neuropathy associated with type 2 diabetes mellitus ([place]) 05/16/2014  . Endometrial polyp 11/01/2013  . Fibroids, intramural 10/09/2013  . Pain due to knee joint prosthesis ([place]) 10/05/2013  . Body mass index (BMI) of 50-59.9 in adult ([place]) 10/03/2013  . Abnormal uterine bleeding 10/03/2013  . Morbid obesity ([place]) 10/01/2013  . Excessive and frequent menstruation with irregular cycle 10/01/2013  . Adaptive colitis 10/01/2013  . History of  migraine headaches 10/01/2013  . H/O malignant neoplasm of skin 10/01/2013  . Fatty liver disease, nonalcoholic 18/29/9371  . Diverticulitis 10/01/2013  . Current tobacco use 08/29/2013  . H/O total knee replacement 08/29/2013  . Cannot sleep 08/29/2013  . Dysmenorrhea 08/29/2013  . Airway hyperreactivity 08/29/2013  . Absolute anemia 08/29/2013  . Tobacco use 08/29/2013    Past Surgical History:  Procedure Laterality Date  . ANTERIOR CERVICAL DECOMP/DISCECTOMY FUSION N/A 08/09/2018   Procedure: ANTERIOR CERVICAL DECOMPRESSION/DISCECTOMY FUSION 1 LEVEL- C4-5;  Surgeon: Meade Maw, MD;  Location: [facility] ORS;  Service: Neurosurgery;  Laterality: N/A;  . BACK SURGERY    . DILATION AND CURETTAGE OF UTERUS    . ENDOMETRIAL BIOPSY     benign  . EXPLORATORY LAPAROTOMY  1992   REMOVAL OF RUPTURED ECTOPIC  . HAND SURGERY Right 1998   cyst removed  . HERNIA REPAIR  6967   UMBILICAL  . JOINT REPLACEMENT Right 2014   TKR  . KNEE ARTHROSCOPY Right 2012  . KNEE SURGERY Right 2014   total knee replacement  . SHOULDER ARTHROSCOPY WITH BICEPSTENOTOMY Left 12/14/2016   Procedure: SHOULDER ARTHROSCOPY WITH BICEPSTENOTOMY;  Surgeon: Corky Mull, MD;  Location: [facility] OR[facility];  Service: Orthopedics;  Laterality: Left;  . SHOULDER ARTHROSCOPY WITH OPEN ROTATOR CUFF REPAIR Left 12/14/2016   Procedure: SHOULDER ARTHROSCOPY WITH OPEN ROTATOR CUFF REPAIR AND ARTHROSCOPIC ROTATOR CUFF REPAIR;  Surgeon: Corky Mull, MD;  Location: [facility];  Service: Orthopedics;  Laterality: Left;  . SHOULDER ARTHROSCOPY WITH ROTATOR CUFF REPAIR Right 01/04/2019   Procedure: SHOULDER ARTHROSCOPY WITH ROTATOR  CUFF REPAIR;  Surgeon: Corky Mull, MD;  Location: [facility];  Service: Orthopedics;  Laterality: Right;  . SHOULDER ARTHROSCOPY WITH SUBACROMIAL DECOMPRESSION Left 12/14/2016   Procedure: SHOULDER ARTHROSCOPY WITH SUBACROMIAL DECOMPRESSION;  Surgeon: Corky Mull, MD;  Location: [facility];  Service: Orthopedics;   Laterality: Left;  . SHOULDER ARTHROSCOPY WITH SUBACROMIAL DECOMPRESSION AND BICEP TENDON REPAIR Right 01/04/2019   Procedure: SHOULDER ARTHROSCOPY WITH DEBRIDEMENT AND SUBACROMIAL DECOMPRESSION-RIGHT;  Surgeon: Corky Mull, MD;  Location: [facility];  Service: Orthopedics;  Laterality: Right;  . TUBAL LIGATION      Family History  Problem Relation Age of Onset  . CAD Maternal Grandmother     Social History   Socioeconomic History  . Marital status: Married    Spouse name: brian  . Number of children: 7  . Years of education: Not on file  . Highest education level: Not on file  Occupational History  . Occupation: disabled    Comment: disabled  Social Needs  . Financial resource strain: Not hard at all  . Food insecurity    Worry: Never true    Inability: Never true  . Transportation needs    Medical: No    Non-medical: No  Tobacco Use  . Smoking status: Former Smoker    Packs/day: 2.00    Years: 25.00    Pack years: 50.00    Types: Cigarettes    Quit date: 06/09/2017    Years since quitting: 2.2  . Smokeless tobacco: Never Used  Substance and Sexual Activity  . Alcohol use: No  . Drug use: No  . Sexual activity: Yes    Partners: Male    Birth control/protection: I.U.D.  Lifestyle  . Physical activity    Days per week: 0 days    Minutes per session: 0 min  . Stress: Not at all  Relationships  . Social connections    Talks on phone: More than three times a week    Gets together: Never    Attends religious service: More than 4 times per year    Active member of club or organization: Yes    Attends meetings of clubs or organizations: More than 4 times per year    Relationship status: Married  . Intimate partner violence    Fear of current or ex partner: No    Emotionally abused: No    Physically abused: No    Forced sexual activity: No  Other Topics Concern  . Not on file  Social History Narrative   Have 6 biological childrean and custody of niece since 30  months.     Current Outpatient Medications:  .  Accu-Chek FastClix Lancets MISC, TEST TID, Disp: , Rfl:  .  ACCU-CHEK GUIDE test strip, U TID UTD, Disp: , Rfl:  .  albuterol (PROVENTIL HFA;VENTOLIN HFA) 108 (90 Base) MCG/ACT inhaler, Inhale 2 puffs into the lungs every 6 (six) hours as needed for wheezing or shortness of breath., Disp: , Rfl:  .  atorvastatin (LIPITOR) 20 MG tablet, Take 1 tablet (20 mg total) by mouth daily., Disp: 90 tablet, Rfl: 3 .  Blood Glucose Monitoring Suppl (ACCU-CHEK GUIDE) w/Device KIT, U UTD TID FOR FINGERSTICK TESTING, Disp: , Rfl:  .  Elastic Bandages & Supports (MEDICAL COMPRESSION STOCKINGS) MISC, 2 medium stockings Use as directed on both lower legs (Patient not taking: Reported on 08/07/2019), Disp: 2 each, Rfl: 0 .  estradiol (ESTRACE) 1 MG tablet, Take 1 tablet (1 mg total) by mouth daily.,  Disp: 21 tablet, Rfl: 0 .  Exenatide ER (BYDUREON) 2 MG PEN, Inject 2 mg into the skin every Sunday. , Disp: , Rfl:  .  gabapentin (NEURONTIN) 300 MG capsule, Take 300 mg by mouth 3 (three) times daily as needed (for neuropathy pain). , Disp: , Rfl:  .  insulin glargine (LANTUS) 100 UNIT/ML injection, Inject 1 mL (100 Units total) into the skin daily., Disp: 10 mL, Rfl: 11 .  insulin lispro (HUMALOG) 100 UNIT/ML injection, Inject 20 Units into the skin 3 (three) times daily before meals., Disp: , Rfl:  .  Insulin Syringe-Needle U-100 (INSULIN SYRINGE 1CC/31GX5/16") 31G X 5/16" 1 ML MISC, USE 1 QID, Disp: , Rfl:  .  naproxen sodium (ALEVE) 220 MG tablet, Take 220 mg by mouth daily as needed., Disp: , Rfl:  .  promethazine (PHENERGAN) 25 MG tablet, Take 1 tablet (25 mg total) by mouth every 6 (six) hours as needed for nausea or vomiting., Disp: 20 tablet, Rfl: 0 .  QUEtiapine (SEROQUEL) 50 MG tablet, Take 50 mg by mouth at bedtime., Disp: , Rfl:  .  tiZANidine (ZANAFLEX) 4 MG tablet, Take 4 mg by mouth 2 (two) times daily., Disp: , Rfl:  .  traZODone (DESYREL) 50 MG tablet,  Take 50 mg by mouth at bedtime as needed for sleep. , Disp: , Rfl: 5 .  venlafaxine (EFFEXOR) 37.5 MG tablet, Take by mouth., Disp: , Rfl:  .  Vitamin D, Ergocalciferol, (DRISDOL) 1.25 MG (50000 UT) CAPS capsule, Take 50,000 Units by mouth every Monday., Disp: , Rfl:   Allergies  Allergen Reactions  . Morphine And Related Nausea And Vomiting  . Tramadol     Nausea. If she takes antinausea medicine with this medicine, then she can tolerate it.    I personally reviewed {Reviewed:14835} with the patient/caregiver today.   ROS ***  Objective  There were no vitals filed for this visit. ***  There is no height or weight on file to calculate BMI.  Physical Exam ***  No results found for this or any previous visit (from the past 72 hour(s)).  Diabetic Foot Exam: Diabetic Foot Exam - Simple   No data filed     ***  PHQ2/9: Depression screen [facility] 2/9 08/07/2019  Decreased Interest 0  Down, Depressed, Hopeless 0  PHQ - 2 Score 0  Altered sleeping 3  Tired, decreased energy 3  Change in appetite 3  Feeling bad or failure about yourself  0  Trouble concentrating 3  Moving slowly or fidgety/restless 3  Suicidal thoughts 0  PHQ-9 Score 15  Difficult doing work/chores Somewhat difficult   PHQ-2/9 Result is {gen negative/positive:315881}.    Fall Risk: Fall Risk  08/07/2019  Falls in the past year? 1  Number falls in past yr: 1  Injury with Fall? 1  Follow up Falls evaluation completed   ***   Functional Status Survey:   ***  Assessment & Plan  There are no diagnoses linked to this encounter.  -Reviewed Health Maintenance: ***

## 2019-08-30 NOTE — Progress Notes (Signed)
Name: Brenda Hicks MRN: [id number] DOB: [birth year] Date:08/30/2019  Progress Note  Subjective  Chief Complaint  No chief complaint on file.   HPI  Pt presents for 2 week follow up for chronic disease management. She was new to me at her last visit, and we discussed left hip/leg pain, DMT2, obesity, and depression. Today we will address the additional following concerns:  Heavy Menses: Pt reports heavy bleeding since having an IUD placed on Sept 11 th. She went to ER September 22 nd and began an 1 mg of estradoil oral contraceptive but continues to bleed. She has with GYN tomorrow at 10 am to further discuss. Has abdominal cramping 10/10 with Aleve with no relief.   DM: Was referred to endocrinology - she is given the number to call them to schedule.  Last cbg 125 last night and continues to take medications. She reports recent hypoglycemic episode down to 60-69, denies polyuria or polydipsia but does notice shaking in her hands. Last Monday evening was the last event. She has change her eating habits have changed and is not eating any fried foods, no potatoes, using baked items.  She states has been using 40 units Humalog twice a daily - advised to decrease to 30 units due to hypoglycemic episodes.   Bilateral lower extremity swelling: she has seen Dr. Leia Alf on September 22nd, he reccommended an MRI lower lumbar it was denies but waiting approval by insurance. She was told that her varicose veins are inflamed. Gabapentin is not helping. She needs to schedule follow up.  HLD: She has not started her atorvastatin yet - advised needs to start this; goal is <70.  Was taken off ASA a few years ago for neck surgery and never restarted - will wait for GYN eval of her anemia and ongoing bleeding prior to making recommendation to restart.   Fatty Liver: Last LFT's are normal; no abdominal pain, no scleral icterus, normal color/consistency of BM's.   Obesity: She is working on eating a healthier diet;  she is baking/boiling or using the airfryer.  She still eats too much ranch dressing; encouraged her to do chair exercises and walk as tolerated.  Stopped teas and sodas; drinking more flavored water.  OSA: She does not wear CPAP - does not have machine.  We will refer for evaluation for this.  She notes she only gets about 2 hours of sleep a night and has headaches when she wakes up.  She endorses loud snoring and daytime fatigue (also has anemia). We will refer today.  History Stroke: She notes occurred in 2018 per her report, though I cannot locate this inpatient stay in her records.  She does get some numbness intermittently in her hands.  Otherwise no weakness, speech issues.        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   Stroke (cerebrum) ([place]) 03/30/2018   Ischemic chest pain ([place]) 03/29/2018   Chest pain 03/29/2018   Sepsis affecting skin 06/09/2017   Sebaceous cyst 06/01/2016   Obstructive apnea 05/31/2016   HLD (hyperlipidemia) 05/31/2016   Acid reflux 05/31/2016   Essential (primary) hypertension 05/31/2016   Abnormal Pap smear of cervix 05/31/2016   Arthritis of knee, degenerative  07/25/2015   History of artificial joint 07/25/2015   Gonalgia 02/17/2015   Type 2 diabetes mellitus ([place]) 10/23/2014   Otalgia of left ear 09/24/2014   Mixed conductive and sensorineural hearing loss, 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/2014   Diabetic peripheral neuropathy associated with type 2 diabetes mellitus ([place]) 05/16/2014   Endometrial polyp 11/01/2013   Fibroids, intramural 10/09/2013   Pain due to knee joint prosthesis ([place])  10/05/2013   Body mass index (BMI) of 50-59.9 in adult ([place]) 10/03/2013   Abnormal uterine bleeding 10/03/2013   Morbid obesity ([place]) 10/01/2013   Excessive and frequent menstruation with irregular cycle 10/01/2013   Adaptive colitis 10/01/2013   History of migraine headaches 10/01/2013   H/O malignant neoplasm of skin 10/01/2013   Fatty liver disease, nonalcoholic 69/62/9528   Diverticulitis 10/01/2013   Current tobacco use 08/29/2013   H/O total knee replacement 08/29/2013   Cannot sleep 08/29/2013   Dysmenorrhea 08/29/2013   Airway hyperreactivity 08/29/2013   Absolute anemia 08/29/2013   Tobacco use 08/29/2013        Past Surgical History:  Procedure Laterality Date   ANTERIOR CERVICAL DECOMP/DISCECTOMY FUSION N/A 08/09/2018   Procedure: ANTERIOR CERVICAL DECOMPRESSION/DISCECTOMY FUSION 1 LEVEL- C4-5; Surgeon: Meade Maw, MD; Location: [facility]S; Service: Neurosurgery; Laterality: N/A;   BACK SURGERY     DILATION AND CURETTAGE OF UTERUS     ENDOMETRIAL BIOPSY     benign   EXPLORATORY LAPAROTOMY  1992   REMOVAL OF RUPTURED ECTOPIC   HAND SURGERY Right 1998   cyst removed   HERNIA REPAIR  4132   UMBILICAL   JOINT REPLACEMENT Right 2014   TKR   KNEE ARTHROSCOPY Right 2012   KNEE SURGERY Right 2014   total knee replacement   SHOULDER ARTHROSCOPY WITH BICEPSTENOTOMY Left 12/14/2016   Procedure: SHOULDER ARTHROSCOPY WITH BICEPSTENOTOMY; Surgeon: Corky Mull, MD; Location: [facility]; Service: Orthopedics; Laterality: Left;   SHOULDER ARTHROSCOPY WITH OPEN ROTATOR CUFF REPAIR Left 12/14/2016   Procedure: SHOULDER ARTHROSCOPY WITH OPEN ROTATOR CUFF REPAIR AND ARTHROSCOPIC ROTATOR CUFF REPAIR; Surgeon: Corky Mull, MD; Location: [facility]; Service: Orthopedics; Laterality: Left;   SHOULDER ARTHROSCOPY WITH ROTATOR CUFF REPAIR Right 01/04/2019   Procedure: SHOULDER ARTHROSCOPY WITH ROTATOR CUFF REPAIR; Surgeon: Corky Mull, MD; Location: [facility];  Service: Orthopedics; Laterality: Right;   SHOULDER ARTHROSCOPY WITH SUBACROMIAL DECOMPRESSION Left 12/14/2016   Procedure: SHOULDER ARTHROSCOPY WITH SUBACROMIAL DECOMPRESSION; Surgeon: Corky Mull, MD; Location: [facility]; Service: Orthopedics; Laterality: Left;   SHOULDER ARTHROSCOPY WITH SUBACROMIAL DECOMPRESSION AND BICEP TENDON REPAIR Right 01/04/2019   Procedure: SHOULDER ARTHROSCOPY WITH DEBRIDEMENT AND SUBACROMIAL DECOMPRESSION-RIGHT; Surgeon: Corky Mull, MD; Location: [facility]; Service: Orthopedics; Laterality: Right;   TUBAL LIGATION          Family History  Problem Relation Age of Onset   CAD Maternal Grandmother    Social History        Socioeconomic History   Marital status: Married    Spouse name: brian   Number of children: 7   Years of education: Not on file   Highest education level: Not on file  Occupational History   Occupation: disabled    Comment: disabled  Scientist, product/process development strain: Not hard at all   Food insecurity    Worry: Never true    Inability: Never true   Transportation needs    Medical: No    Non-medical: No  Tobacco Use   Smoking status: Former  Smoker    Packs/day: 2.00    Years: 25.00    Pack years: 50.00    Types: Cigarettes    Quit date: 06/09/2017    Years since quitting: 2.2   Smokeless tobacco: Never Used  Substance and Sexual Activity   Alcohol use: No   Drug use: No   Sexual activity: Yes    Partners: Male    Birth control/protection: I.U.D.  Lifestyle   Physical activity    Days per week: 0 days    Minutes per session: 0 min   Stress: Not at all  Relationships   Social connections    Talks on phone: More than three times a week    Gets together: Never    Attends religious service: More than 4 times per year    Active member of club or organization: Yes    Attends meetings of clubs or organizations: More than 4 times per year    Relationship status: Married   Intimate partner  violence    Fear of current or ex partner: No    Emotionally abused: No    Physically abused: No    Forced sexual activity: No  Other Topics Concern   Not on file  Social History Narrative   Have 6 biological childrean and custody of niece since 73 months.   Current Outpatient Medications:   Accu-Chek FastClix Lancets MISC, TEST TID, Disp: , Rfl:   ACCU-CHEK GUIDE test strip, U TID UTD, Disp: , Rfl:   albuterol (PROVENTIL HFA;VENTOLIN HFA) 108 (90 Base) MCG/ACT inhaler, Inhale 2 puffs into the lungs every 6 (six) hours as needed for wheezing or shortness of breath., Disp: , Rfl:   atorvastatin (LIPITOR) 20 MG tablet, Take 1 tablet (20 mg total) by mouth daily., Disp: 90 tablet, Rfl: 3   Blood Glucose Monitoring Suppl (ACCU-CHEK GUIDE) w/Device KIT, U UTD TID FOR FINGERSTICK TESTING, Disp: , Rfl:   Elastic Bandages & Supports (MEDICAL COMPRESSION STOCKINGS) MISC, 2 medium stockings Use as directed on both lower legs (Patient not taking: Reported on 08/07/2019), Disp: 2 each, Rfl: 0   estradiol (ESTRACE) 1 MG tablet, Take 1 tablet (1 mg total) by mouth daily., Disp: 21 tablet, Rfl: 0   Exenatide ER (BYDUREON) 2 MG PEN, Inject 2 mg into the skin every Sunday. , Disp: , Rfl:   gabapentin (NEURONTIN) 300 MG capsule, Take 300 mg by mouth 3 (three) times daily as needed (for neuropathy pain). , Disp: , Rfl:   insulin glargine (LANTUS) 100 UNIT/ML injection, Inject 1 mL (100 Units total) into the skin daily., Disp: 10 mL, Rfl: 11   insulin lispro (HUMALOG) 100 UNIT/ML injection, Inject 20 Units into the skin 3 (three) times daily before meals., Disp: , Rfl:   Insulin Syringe-Needle U-100 (INSULIN SYRINGE 1CC/31GX5/16") 31G X 5/16" 1 ML MISC, USE 1 QID, Disp: , Rfl:   naproxen sodium (ALEVE) 220 MG tablet, Take 220 mg by mouth daily as needed., Disp: , Rfl:   promethazine (PHENERGAN) 25 MG tablet, Take 1 tablet (25 mg total) by mouth every 6 (six) hours as needed for nausea or vomiting.,  Disp: 20 tablet, Rfl: 0   QUEtiapine (SEROQUEL) 50 MG tablet, Take 50 mg by mouth at bedtime., Disp: , Rfl:   tiZANidine (ZANAFLEX) 4 MG tablet, Take 4 mg by mouth 2 (two) times daily., Disp: , Rfl:   traZODone (DESYREL) 50 MG tablet, Take 50 mg by mouth at bedtime as needed for sleep. , Disp: , Rfl: 5  venlafaxine (EFFEXOR) 37.5 MG tablet, Take by mouth., Disp: , Rfl:   Vitamin D, Ergocalciferol, (DRISDOL) 1.25 MG (50000 UT) CAPS capsule, Take 50,000 Units by mouth every Monday., Disp: , Rfl:       Allergies  Allergen Reactions   Morphine And Related Nausea And Vomiting   Tramadol     Nausea. If she takes antinausea medicine with this medicine, then she can tolerate it.  I personally reviewed active problem list, medication list, allergies, family history, health maintenance, notes from last encounter, lab results with the patient/caregiver today.    ROS  Ten systems reviewed and is negative except as mentioned in HPI   Objective  Vitals:   08/30/19 1004  BP: 130/84  Pulse: 100  Resp: 18  Temp: 97.9 F (36.6 C)  SpO2: 95%    There is no height or weight on file to calculate BMI.  Physical Exam  Constitutional: Patient appears well-developed and well-nourished. No distress.  HENT: Head: Normocephalic and atraumatic. Eyes: Conjunctivae and EOM are normal. No scleral icterus.   Neck: Normal range of motion. Neck supple. No JVD present. Cardiovascular: Normal rate, regular rhythm and normal heart sounds.  No murmur heard. No BLE edema. Pulmonary/Chest: Effort normal and breath sounds normal. No respiratory distress. Abdominal: Soft. Bowel sounds are normal, no distension. There is no tenderness. No masses. Musculoskeletal: Normal range of motion, no joint effusions. No gross deformities Neurological: Pt is alert and oriented to person, place, and time. No cranial nerve deficit. Coordination, balance, strength, speech and gait are normal.  Skin: Skin is warm and dry. No  rash noted. No erythema.  Psychiatric: Patient has a normal mood and affect. behavior is normal. Judgment and thought content normal.  PHQ2/9:   Depression screen [facility] 2/9 08/30/2019 08/07/2019  Decreased Interest 0 0  Down, Depressed, Hopeless 0 0  PHQ - 2 Score 0 0  Altered sleeping 0 3  Tired, decreased energy 0 3  Change in appetite 0 3  Feeling bad or failure about yourself  0 0  Trouble concentrating 0 3  Moving slowly or fidgety/restless 0 3  Suicidal thoughts 0 0  PHQ-9 Score 0 15  Difficult doing work/chores Not difficult at all Somewhat difficult   PHQ-2/9 Result is negative.  Fall Risk:  Fall Risk  08/07/2019  Falls in the past year? 1  Number falls in past yr: 1  Injury with Fall? 1  Follow up Falls evaluation completed   Assessment & Plan  1. Dysmenorrhea -Follow up with GYN appointment tomorrow 08/30/19   2. Type 2 diabetes mellitus with hyperglycemia, with long-term current use of insulin ([place]) -Follow up with Endocrinology for management of DM2 -Decrease HUMALOG to 30 units to prevent hypoglycemia -Monitor glucose closely  3. Abnormal uterine bleeding -Follow up with GYN   4. Hyperlipidemia associated with type 2 diabetes mellitus ([place]) -Follow up with Endocrinology; taking statin therapy  5. Morbid obesity ([place]) -Diet and Exercise management  - Discussed importance of 150 minutes of physical activity weekly, eat two servings of fish weekly, eat one serving of tree nuts ( cashews, pistachios, pecans, almonds.Marland Kitchen) every other day, eat 6 servings of fruit/vegetables daily and drink plenty of water and avoid sweet beverages.   6. Body mass index (BMI) of 50-59.9 in adult [facility]) -Continue nutrition surveillance   7. Obstructive apnea - Ambulatory referral to Pulmonology  8. Fatty liver disease, nonalcoholic -Take atorvastatin and continue to work on weight loss  9. History of ischemic stroke -  Continue statin therapy

## 2019-08-31 ENCOUNTER — Other Ambulatory Visit: Payer: Self-pay

## 2019-08-31 ENCOUNTER — Ambulatory Visit (INDEPENDENT_AMBULATORY_CARE_PROVIDER_SITE_OTHER): Payer: Medicaid Other | Admitting: Certified Nurse Midwife

## 2019-08-31 ENCOUNTER — Encounter: Payer: Self-pay | Admitting: Certified Nurse Midwife

## 2019-08-31 VITALS — Ht 62.0 in | Wt 359.2 lb

## 2019-08-31 DIAGNOSIS — Z30431 Encounter for routine checking of intrauterine contraceptive device: Secondary | ICD-10-CM

## 2019-08-31 NOTE — Patient Instructions (Signed)
Abnormal Uterine Bleeding °Abnormal uterine bleeding means bleeding more than usual from your uterus. It can include: °· Bleeding between periods. °· Bleeding after sex. °· Bleeding that is heavier than normal. °· Periods that last longer than usual. °· Bleeding after you have stopped having your period (menopause). °There are many problems that may cause this. You should see a doctor for any kind of bleeding that is not normal. Treatment depends on the cause of the bleeding. °Follow these instructions at home: °· Watch your condition for any changes. °· Do not use tampons, douche, or have sex, if your doctor tells you not to. °· Change your pads often. °· Get regular well-woman exams. Make sure they include a pelvic exam and cervical cancer screening. °· Keep all follow-up visits as told by your doctor. This is important. °Contact a doctor if: °· The bleeding lasts more than one week. °· You feel dizzy at times. °· You feel like you are going to throw up (nauseous). °· You throw up. °Get help right away if: °· You pass out. °· You have to change pads every hour. °· You have belly (abdominal) pain. °· You have a fever. °· You get sweaty. °· You get weak. °· You passing large blood clots from your vagina. °Summary °· Abnormal uterine bleeding means bleeding more than usual from your uterus. °· There are many problems that may cause this. You should see a doctor for any kind of bleeding that is not normal. °· Treatment depends on the cause of the bleeding. °This information is not intended to replace advice given to you by your health care provider. Make sure you discuss any questions you have with your health care provider. °Document Released: 09/05/2009 Document Revised: 11/02/2016 Document Reviewed: 11/02/2016 °Elsevier Patient Education © 2020 [employer] ° °

## 2019-08-31 NOTE — Progress Notes (Signed)
Pt declines string check today , stating it was done in the ED. She would like to talk about surgical options . Explained again that surgical options are not first choice for management. Discussed medication management. Pt declines. Explained that she would need to see MD to discuss surgical options. PT verbalizes and agrees.   Follow up with MD.   Philip Aspen, CNM

## 2019-09-04 ENCOUNTER — Encounter: Payer: Self-pay | Admitting: Family Medicine

## 2019-09-05 IMAGING — MR MR CERVICAL SPINE W/O CM
5 series · 33 of 48 positions shown · non-contrast
Comparison: MRI cervical spine 07/19/2018. CT soft tissue neck
08/15/2018.

CLINICAL DATA: Patient status post C4-5 discectomy and fusion
08/09/2018. Worsening neck pain radiating in to both arms and down
the back. No known injury.

EXAM:
MRI CERVICAL SPINE WITHOUT CONTRAST
TECHNIQUE: Multiplanar, multisequence MR imaging of the cervical spine was
performed. No intravenous contrast was administered.

[Series 2: T2 · sagittal · 3.0mm · 0.56mm/px · 6 of 13 slices shown (1 of 2)]
[im 1/13]
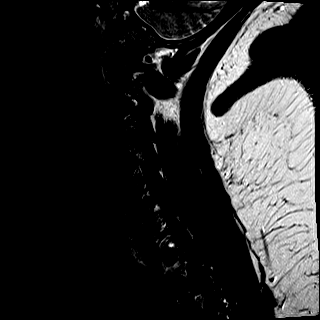
[im 3/13]
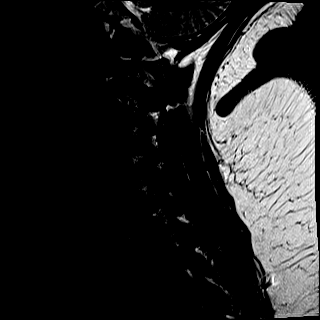
[im 5/13]
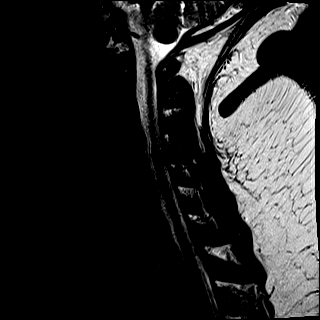
[im 8/13]
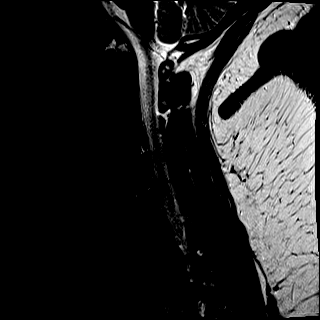
[im 10/13]
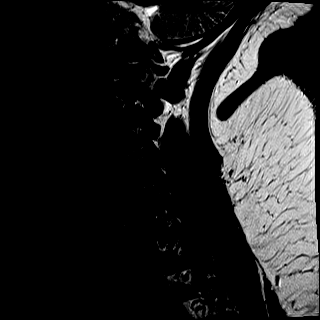
[im 13/13]
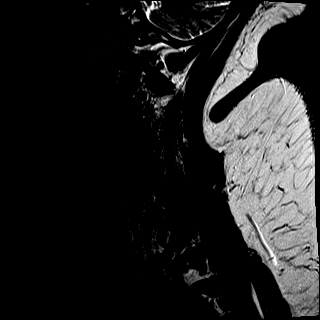

[Series 3: T1 · sagittal · 3.0mm · 0.70mm/px · 7 of 13 slices shown]
[im 1/13]
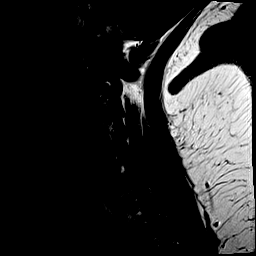
[im 3/13]
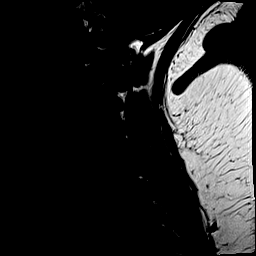
[im 5/13]
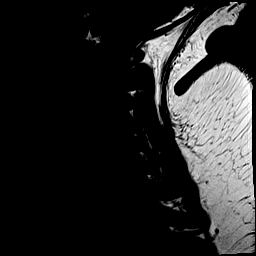
[im 7/13]
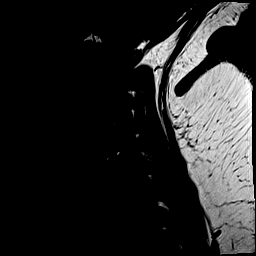
[im 9/13]
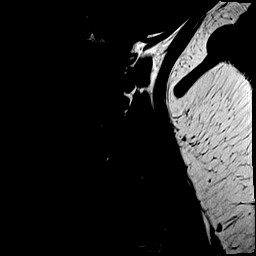
[im 11/13]
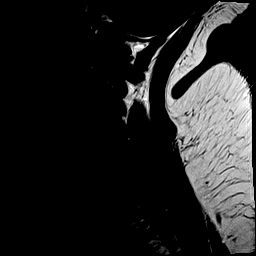
[im 13/13]
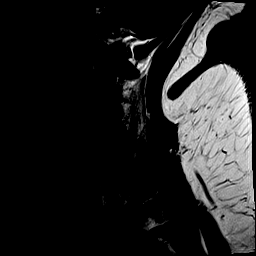

[Series 4: STIR · sagittal · 3.0mm · 0.35mm/px · 7 of 13 slices shown]
[im 1/13]
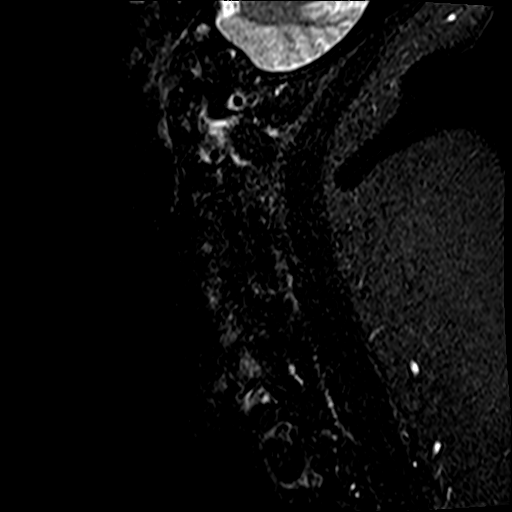
[im 3/13]
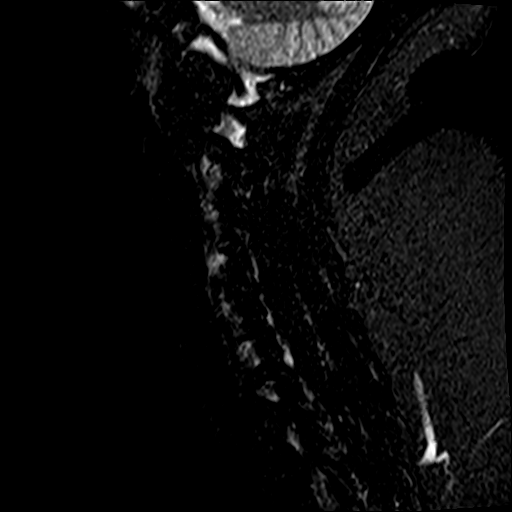
[im 5/13]
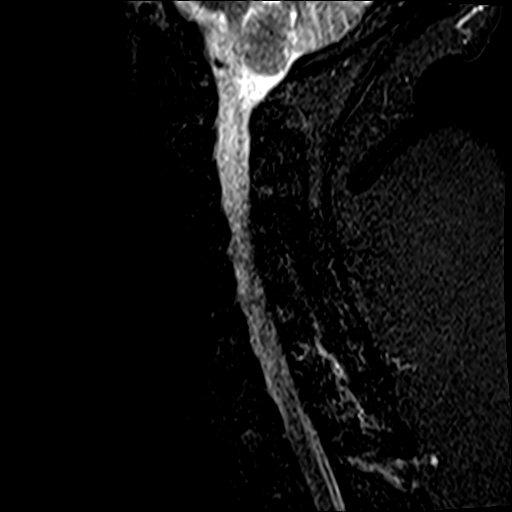
[im 7/13]
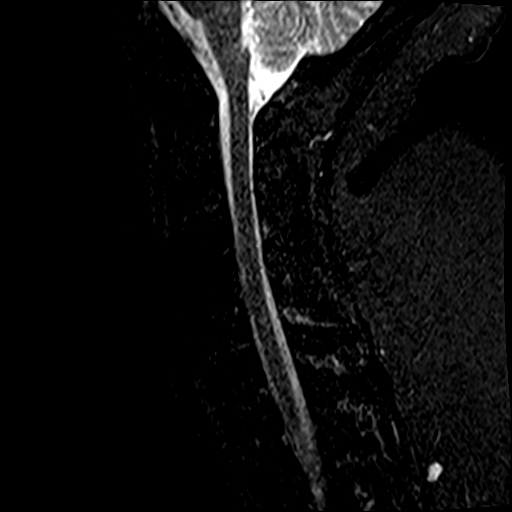
[im 9/13]
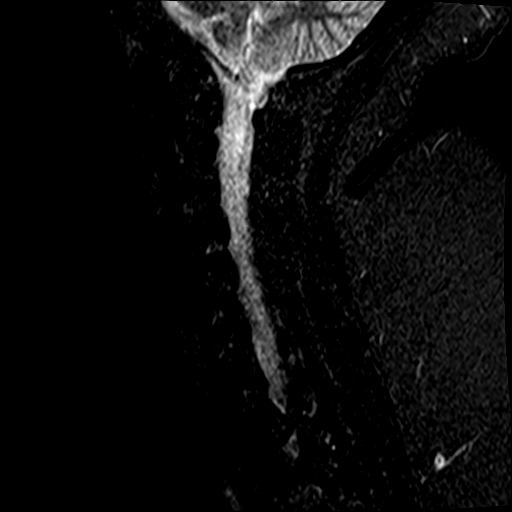
[im 11/13]
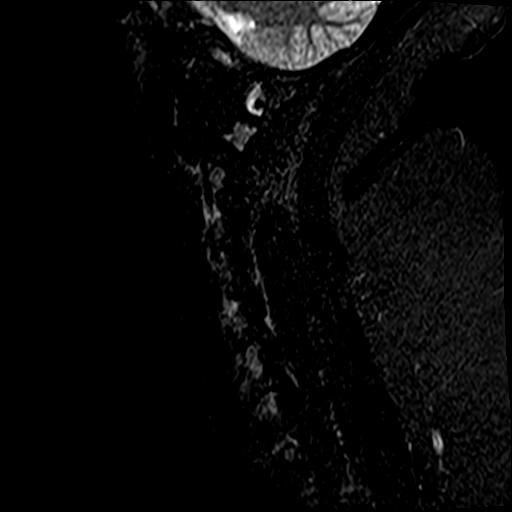
[im 13/13]
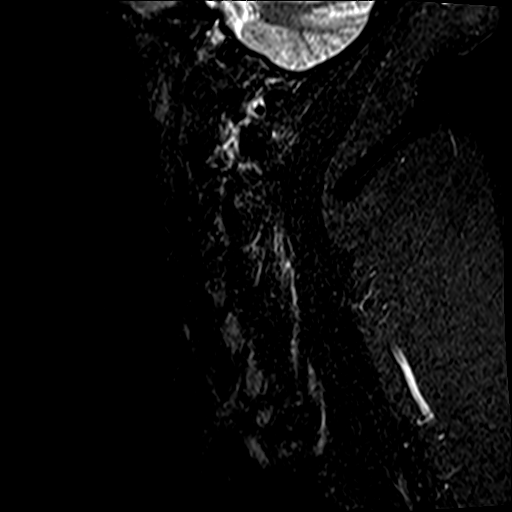

[Series 5: T2 · axial · 3.0mm · 0.62mm/px · z∈[-109,-15]mm · 8 of 27 slices shown (2 of 2)]
[im 1/27]
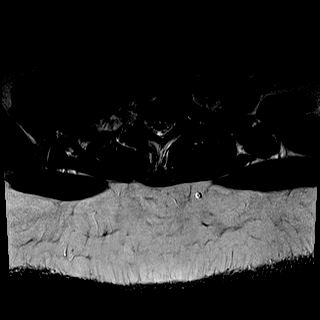
[im 5/27]
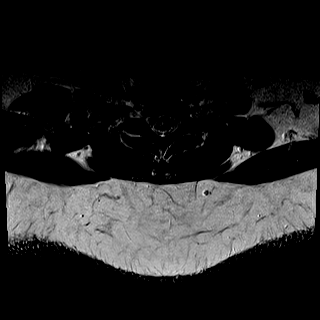
[im 9/27]
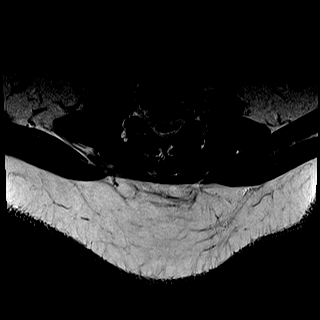
[im 13/27]
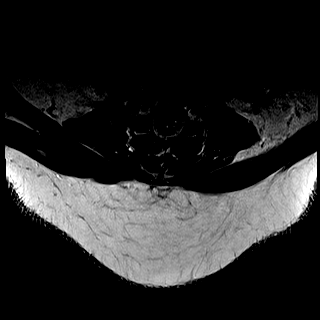
[im 15/27]
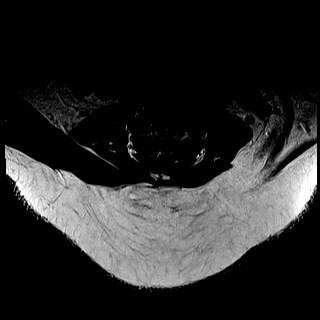
[im 19/27]
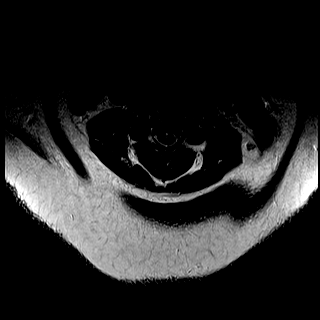
[im 23/27]
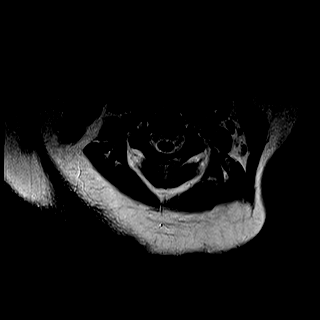
[im 27/27]
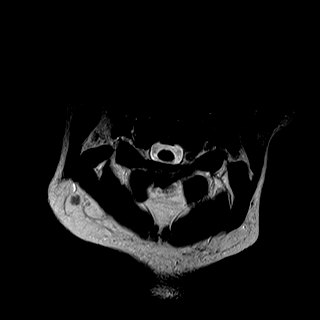

[Series 6: mpgr ax · axial · 3.0mm · 0.39mm/px · z∈[-100,-50]mm · 5 of 27 slices shown]
[im 1/27]
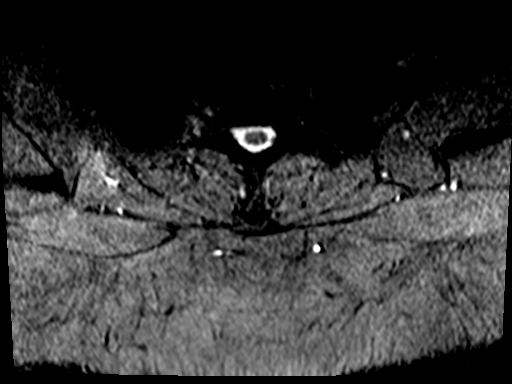
[im 5/27]
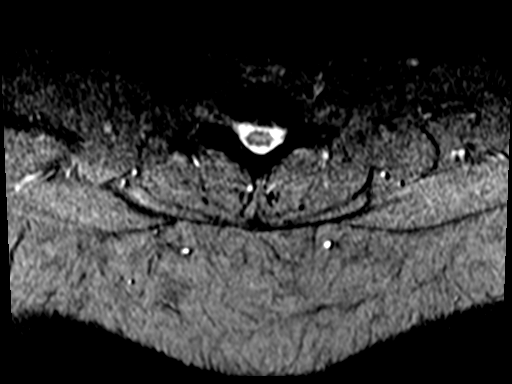
[im 9/27]
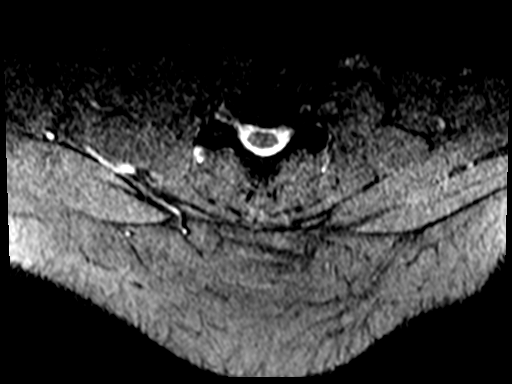
[im 13/27]
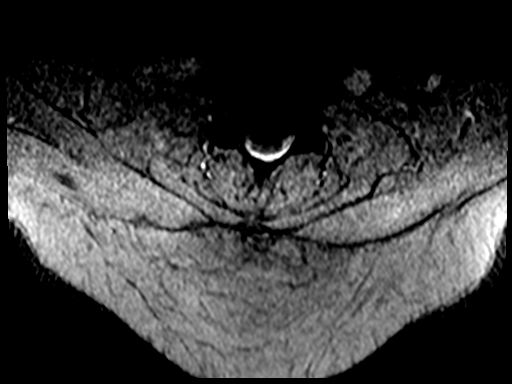
[im 15/27]
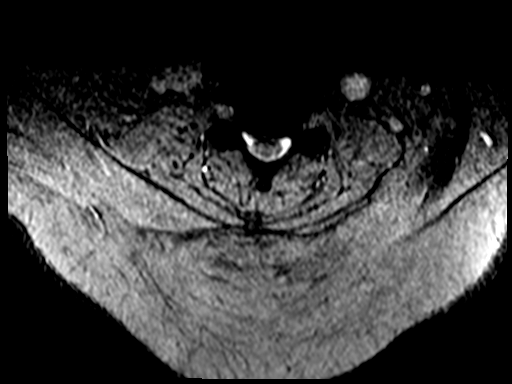

[33 of 48 positions shown; findings below may reference images not displayed]

FINDINGS: Alignment: Maintained with straightening of lordosis noted.

Vertebrae: No fracture or worrisome lesion. The patient is status
post C4-5 ACDF since the prior MRI.

Cord: Normal signal throughout.

Posterior Fossa, vertebral arteries, paraspinal tissues: Negative.

Disc levels:

C2-3: Negative.

C3-4: Shallow disc bulge and mild uncovertebral disease. The central
canal and foramina remain open.

C4-5: Status post discectomy and fusion. The central canal and
foramina are open.

C5-6: Mild uncovertebral disease and facet degeneration. The central
canal is open. Mild bilateral foraminal narrowing is seen. The
appearance is unchanged.

C6-7: Negative.

C7-T1: Negative.
IMPRESSION: No new abnormality since the prior MRI. The central canal is open at
all levels.

Status post C4-5 discectomy and fusion. The central canal and
foramina are open. No complicating feature is identified.

No change in mild bilateral foraminal narrowing at C5-6.

## 2019-09-07 ENCOUNTER — Other Ambulatory Visit: Payer: Self-pay | Admitting: Family Medicine

## 2019-09-07 DIAGNOSIS — M545 Low back pain, unspecified: Secondary | ICD-10-CM

## 2019-09-07 DIAGNOSIS — M79605 Pain in left leg: Secondary | ICD-10-CM

## 2019-09-07 NOTE — Progress Notes (Signed)
Received note from [facility] that patient needs referral to [facility] Pain clinic from PCP due to her insurance.  I have placed referral.

## 2019-09-12 ENCOUNTER — Other Ambulatory Visit: Payer: Self-pay | Admitting: Obstetrics and Gynecology

## 2019-09-12 ENCOUNTER — Ambulatory Visit (INDEPENDENT_AMBULATORY_CARE_PROVIDER_SITE_OTHER): Payer: Medicaid Other | Admitting: Obstetrics and Gynecology

## 2019-09-12 ENCOUNTER — Other Ambulatory Visit: Payer: Self-pay

## 2019-09-12 ENCOUNTER — Encounter: Payer: Self-pay | Admitting: Obstetrics and Gynecology

## 2019-09-12 VITALS — BP 151/76 | HR 99 | Ht 66.0 in | Wt 358.3 lb

## 2019-09-12 DIAGNOSIS — N939 Abnormal uterine and vaginal bleeding, unspecified: Secondary | ICD-10-CM | POA: Diagnosis not present

## 2019-09-12 DIAGNOSIS — N921 Excessive and frequent menstruation with irregular cycle: Secondary | ICD-10-CM

## 2019-09-12 DIAGNOSIS — Z975 Presence of (intrauterine) contraceptive device: Secondary | ICD-10-CM | POA: Diagnosis not present

## 2019-09-12 MED ORDER — NORETHINDRONE ACETATE 5 MG PO TABS: 5.0000 mg | ORAL_TABLET | Freq: Two times a day (BID) | ORAL | 0 refills | Status: DC

## 2019-09-12 NOTE — Progress Notes (Signed)
HPI:      [name] is a 53 y.o. [phone] who LMP was No LMP recorded. (Menstrual status: IUD).  Subjective:   She presents today because she has been bleeding for approximately 2 months and she is understandably tired of bleeding.  She has been frustrated by this and is wanting to discuss definitive surgery-hysterectomy.  Her menstrual period history includes the fact that she has had "irregular cycles" her entire life.  She had 7 children in a row year after year.  Approximately 6 years ago she had an IUD placed which functioned well without any bleeding for 5 years.  In January of this year she had it removed and went to 6 months with amenorrhea.  She then began having heavy menstrual bleeding and decided to have a new IUD placed.  This was placed and she has been essentially bleeding ever since.  Her medical conditions that are significant include morbid obesity, hypertension, diabetes.    Hx: The following portions of the patient's history were reviewed and updated as appropriate:             She  has a past medical history of Anemia, Arthritis, Asthma, Cancer of ear, Diabetes mellitus without complication ([place]), Fatty liver, Hypertension, Kidney cysts, Renal disorder, Sleep apnea, and Stroke [facility]) (May or June 2019). She does not have any pertinent problems on file. She  has a past surgical history that includes Knee surgery (Right, 2014); Dilation and curettage of uterus; Endometrial biopsy; Exploratory laparotomy (1992); Hand surgery (Right, 1998); Tubal ligation; Knee arthroscopy (Right, 2012); Shoulder arthroscopy with open rotator cuff repair (Left, 12/14/2016); Shoulder arthroscopy with subacromial decompression (Left, 12/14/2016); Shoulder arthroscopy with bicepstenotomy (Left, 12/14/2016); Anterior cervical decomp/discectomy fusion (N/A, 08/09/2018); Joint replacement (Right, 2014); Hernia repair (1975); Back surgery; Shoulder arthroscopy with subacromial decompression and bicep  tendon repair (Right, 01/04/2019); and Shoulder arthroscopy with rotator cuff repair (Right, 01/04/2019). Her family history includes CAD in her maternal grandmother. She  reports that she quit smoking about 2 years ago. Her smoking use included cigarettes. She has a 50.00 pack-year smoking history. She has never used smokeless tobacco. She reports that she does not drink alcohol or use drugs. She has a current medication list which includes the following prescription(s): accu-chek fastclix lancets, accu-chek guide, albuterol, atorvastatin, accu-chek guide, estradiol, exenatide er, gabapentin, insulin glargine, insulin lispro, insulin syringe 1cc/31gx5/16", naproxen sodium, promethazine, vitamin d (ergocalciferol), quetiapine, tizanidine, trazodone, and venlafaxine. She is allergic to morphine and related and tramadol.       Review of Systems:  Review of Systems  Constitutional: Denied constitutional symptoms, night sweats, recent illness, fatigue, fever, insomnia and weight loss.  Eyes: Denied eye symptoms, eye pain, photophobia, vision change and visual disturbance.  Ears/Nose/Throat/Neck: Denied ear, nose, throat or neck symptoms, hearing loss, nasal discharge, sinus congestion and sore throat.  Cardiovascular: Denied cardiovascular symptoms, arrhythmia, chest pain/pressure, edema, exercise intolerance, orthopnea and palpitations.  Respiratory: Denied pulmonary symptoms, asthma, pleuritic pain, productive sputum, cough, dyspnea and wheezing.  Gastrointestinal: Denied, gastro-esophageal reflux, melena, nausea and vomiting.  Genitourinary: See HPI for additional information.  Musculoskeletal: Denied musculoskeletal symptoms, stiffness, swelling, muscle weakness and myalgia.  Dermatologic: Denied dermatology symptoms, rash and scar.  Neurologic: Denied neurology symptoms, dizziness, headache, neck pain and syncope.  Psychiatric: Denied psychiatric symptoms, anxiety and depression.  Endocrine:  Denied endocrine symptoms including hot flashes and night sweats.   Meds:   Current Outpatient Medications on File Prior to Visit  Medication Sig Dispense Refill  .  Accu-Chek FastClix Lancets MISC TEST TID    . ACCU-CHEK GUIDE test strip U TID UTD    . albuterol (PROVENTIL HFA;VENTOLIN HFA) 108 (90 Base) MCG/ACT inhaler Inhale 2 puffs into the lungs every 6 (six) hours as needed for wheezing or shortness of breath.    Marland Kitchen atorvastatin (LIPITOR) 20 MG tablet Take 1 tablet (20 mg total) by mouth daily. 90 tablet 3  . Blood Glucose Monitoring Suppl (ACCU-CHEK GUIDE) w/Device KIT U UTD TID FOR FINGERSTICK TESTING    . estradiol (ESTRACE) 1 MG tablet Take 1 tablet (1 mg total) by mouth daily. 21 tablet 0  . Exenatide ER (BYDUREON) 2 MG PEN Inject 2 mg into the skin every Sunday.     . gabapentin (NEURONTIN) 300 MG capsule Take 300 mg by mouth 3 (three) times daily as needed (for neuropathy pain).     . insulin glargine (LANTUS) 100 UNIT/ML injection Inject 1 mL (100 Units total) into the skin daily. 10 mL 11  . insulin lispro (HUMALOG) 100 UNIT/ML injection Inject 30 Units into the skin 3 (three) times daily before meals.    . Insulin Syringe-Needle U-100 (INSULIN SYRINGE 1CC/31GX5/16") 31G X 5/16" 1 ML MISC USE 1 QID    . naproxen sodium (ALEVE) 220 MG tablet Take 220 mg by mouth daily as needed.    . promethazine (PHENERGAN) 25 MG tablet Take 1 tablet (25 mg total) by mouth every 6 (six) hours as needed for nausea or vomiting. 20 tablet 0  . Vitamin D, Ergocalciferol, (DRISDOL) 1.25 MG (50000 UT) CAPS capsule Take 50,000 Units by mouth every Monday.    Marland Kitchen QUEtiapine (SEROQUEL) 50 MG tablet Take 50 mg by mouth at bedtime.    Marland Kitchen tiZANidine (ZANAFLEX) 4 MG tablet Take 4 mg by mouth 2 (two) times daily.    . traZODone (DESYREL) 50 MG tablet Take 50 mg by mouth at bedtime as needed for sleep.   5  . venlafaxine (EFFEXOR) 37.5 MG tablet Take by mouth.     No current facility-administered medications on  file prior to visit.     Objective:     Vitals:   09/12/19 1511  BP: (!) 151/76  Pulse: 99                Assessment:    [id number] Patient Active Problem List   Diagnosis Date Noted  . Hyperlipidemia associated with type 2 diabetes mellitus ([place]) 08/08/2019  . Low back pain radiating to left lower extremity 08/07/2019  . Cervical myelopathy ([place]) 08/09/2018  . DJD (degenerative joint disease) of cervical spine 08/04/2018  . 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 History of ischemic stroke 03/30/2018  . Ischemic chest pain ([place]) 03/29/2018  . Chest pain 03/29/2018  . Sebaceous cyst 06/01/2016  . Obstructive apnea 05/31/2016  . Acid reflux 05/31/2016  . Essential (primary) hypertension 05/31/2016  . Abnormal Pap smear of cervix 05/31/2016  . Arthritis of knee, degenerative 07/25/2015  . History of artificial joint 07/25/2015  . Gonalgia 02/17/2015  . Type 2 diabetes mellitus ([place]) 10/23/2014  . 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 Diabetic peripheral neuropathy associated with type 2 diabetes mellitus ([place]) 05/16/2014  . Endometrial polyp 11/01/2013  . Fibroids, intramural 10/09/2013  . Pain due to knee joint prosthesis ([place]) 10/05/2013  . Body mass index (BMI) of 50-59.9 in adult ([place]) 10/03/2013  . Abnormal uterine bleeding 10/03/2013  . Morbid obesity ([place])  10/01/2013  . Excessive and frequent menstruation with irregular cycle 10/01/2013  . Adaptive colitis 10/01/2013  . History of migraine headaches 10/01/2013  . H/O malignant neoplasm of skin 10/01/2013  . Fatty liver disease, nonalcoholic 09/24/1593  . Diverticulitis 10/01/2013  . Former smoker 08/29/2013  . H/O total knee replacement 08/29/2013  . Insomnia 08/29/2013  . Dysmenorrhea 08/29/2013  . Airway hyperreactivity 08/29/2013  . Absolute anemia 08/29/2013     1. Abnormal uterine bleeding   2.  Breakthrough bleeding associated with intrauterine device (IUD)     We have discussed abnormal bleeding in great detail.  I have advised her against major surgery because of her medical problems and the risks associated with her diabetes hypertension and weight. Additionally the average age of menopause is 23 and she is likely to enter menopause very shortly.  I also believe that the IUD has a good chance of working.  After all her last IUD worked for 5 years without issue. Patient is stated that she would not be having this visit where she not bleeding.   Plan:            1.  We will attempt to use Aygestin for 21 days to give her a break from bleeding.  My expectation is that her IUD will begin to function or she will enter menopause in the very near future and she will become amenorrheic thus solving her problems without definitive surgery.  2.  We have also discussed the possibility of endometrial ablation should she continue to bleed despite the above strategies.  She plans to make an appointment with Dr. Marcelline Mates in the next 5 weeks if her bleeding does not improve. Orders No orders of the defined types were placed in this encounter.   No orders of the defined types were placed in this encounter.     F/U  Return in about 5 weeks (around 10/17/2019). I spent 21 minutes involved in the care of this patient of which greater than 50% was spent discussing heavy vaginal bleeding failure of IUD, OCPs, risks of definitive surgery based on her current medical conditions.  All questions answered. Finis Bud, M.D. 09/12/2019 4:23 PM

## 2019-09-12 NOTE — Progress Notes (Signed)
Patient comes in to discuss surgery for heavy bleeding.

## 2019-09-20 ENCOUNTER — Encounter: Payer: Self-pay | Admitting: Family Medicine

## 2019-09-24 ENCOUNTER — Ambulatory Visit
Payer: Medicaid Other | Attending: Student in an Organized Health Care Education/Training Program | Admitting: Student in an Organized Health Care Education/Training Program

## 2019-09-24 ENCOUNTER — Other Ambulatory Visit: Payer: Self-pay

## 2019-09-24 ENCOUNTER — Encounter: Payer: Self-pay | Admitting: Student in an Organized Health Care Education/Training Program

## 2019-09-24 VITALS — BP 146/84 | HR 79 | Temp 99.5°F | Resp 18 | Ht 66.0 in | Wt 324.0 lb

## 2019-09-24 DIAGNOSIS — T8484XS Pain due to internal orthopedic prosthetic devices, implants and grafts, sequela: Secondary | ICD-10-CM | POA: Insufficient documentation

## 2019-09-24 DIAGNOSIS — Z96659 Presence of unspecified artificial knee joint: Secondary | ICD-10-CM | POA: Diagnosis present

## 2019-09-24 DIAGNOSIS — M79605 Pain in left leg: Secondary | ICD-10-CM

## 2019-09-24 DIAGNOSIS — Z8669 Personal history of other diseases of the nervous system and sense organs: Secondary | ICD-10-CM

## 2019-09-24 DIAGNOSIS — Z981 Arthrodesis status: Secondary | ICD-10-CM

## 2019-09-24 DIAGNOSIS — E1142 Type 2 diabetes mellitus with diabetic polyneuropathy: Secondary | ICD-10-CM | POA: Insufficient documentation

## 2019-09-24 DIAGNOSIS — M4722 Other spondylosis with radiculopathy, cervical region: Secondary | ICD-10-CM | POA: Insufficient documentation

## 2019-09-24 DIAGNOSIS — M545 Low back pain, unspecified: Secondary | ICD-10-CM

## 2019-09-24 MED ORDER — PREGABALIN 50 MG PO CAPS: ORAL_CAPSULE | ORAL | 0 refills | Status: DC

## 2019-09-24 MED ORDER — TIZANIDINE HCL 4 MG PO TABS: 6.0000 mg | ORAL_TABLET | Freq: Two times a day (BID) | ORAL | 2 refills | Status: DC | PRN
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
## 2019-09-24 NOTE — Patient Instructions (Signed)

## 2019-09-24 NOTE — Progress Notes (Signed)
Safety precautions to be maintained throughout the outpatient stay will include: orient to surroundings, keep bed in low position, maintain call bell within reach at all times, provide assistance with transfer out of bed and ambulation.  

## 2019-09-26 ENCOUNTER — Encounter: Payer: Self-pay | Admitting: Family Medicine
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
## 2019-10-02 ENCOUNTER — Other Ambulatory Visit: Payer: Self-pay | Admitting: Sports Medicine

## 2019-10-02 DIAGNOSIS — G8929 Other chronic pain: Secondary | ICD-10-CM

## 2019-10-02 DIAGNOSIS — M5136 Other intervertebral disc degeneration, lumbar region: Secondary | ICD-10-CM

## 2019-10-02 DIAGNOSIS — M4722 Other spondylosis with radiculopathy, cervical region: Secondary | ICD-10-CM

## 2019-10-02 DIAGNOSIS — M47816 Spondylosis without myelopathy or radiculopathy, lumbar region: Secondary | ICD-10-CM

## 2019-10-08 ENCOUNTER — Encounter: Payer: Self-pay | Admitting: Student in an Organized Health Care Education/Training Program

## 2019-10-11 ENCOUNTER — Other Ambulatory Visit: Payer: Self-pay

## 2019-10-11 ENCOUNTER — Encounter: Payer: Medicaid Other | Attending: Internal Medicine | Admitting: Skilled Nursing Facility1

## 2019-10-11 ENCOUNTER — Encounter: Payer: Self-pay | Admitting: Skilled Nursing Facility1

## 2019-10-11 DIAGNOSIS — Z87891 Personal history of nicotine dependence: Secondary | ICD-10-CM | POA: Diagnosis not present

## 2019-10-11 DIAGNOSIS — E118 Type 2 diabetes mellitus with unspecified complications: Secondary | ICD-10-CM | POA: Diagnosis present

## 2019-10-11 DIAGNOSIS — E669 Obesity, unspecified: Secondary | ICD-10-CM | POA: Diagnosis not present

## 2019-10-11 DIAGNOSIS — E119 Type 2 diabetes mellitus without complications: Secondary | ICD-10-CM
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
## 2019-10-13 ENCOUNTER — Ambulatory Visit
Admission: RE | Admit: 2019-10-13 | Discharge: 2019-10-13 | Disposition: A | Discharge status: Home / Self Care | Payer: Medicaid Other | Source: Ambulatory Visit | Attending: Sports Medicine | Admitting: Sports Medicine

## 2019-10-13 ENCOUNTER — Other Ambulatory Visit: Payer: Self-pay

## 2019-10-13 DIAGNOSIS — M51369 Other intervertebral disc degeneration, lumbar region without mention of lumbar back pain or lower extremity pain: Secondary | ICD-10-CM

## 2019-10-13 DIAGNOSIS — M5442 Lumbago with sciatica, left side: Secondary | ICD-10-CM | POA: Diagnosis present

## 2019-10-13 DIAGNOSIS — G8929 Other chronic pain: Secondary | ICD-10-CM | POA: Diagnosis present

## 2019-10-13 DIAGNOSIS — M47816 Spondylosis without myelopathy or radiculopathy, lumbar region: Secondary | ICD-10-CM | POA: Diagnosis present

## 2019-10-13 DIAGNOSIS — M5136 Other intervertebral disc degeneration, lumbar region: Secondary | ICD-10-CM | POA: Insufficient documentation

## 2019-10-13 DIAGNOSIS — M4722 Other spondylosis with radiculopathy, cervical region: Secondary | ICD-10-CM | POA: Diagnosis present

## 2019-10-15 ENCOUNTER — Encounter: Payer: Self-pay | Admitting: Student in an Organized Health Care Education/Training Program

## 2019-10-15 ENCOUNTER — Other Ambulatory Visit: Payer: Self-pay

## 2019-10-15 ENCOUNTER — Ambulatory Visit
Payer: Medicaid Other | Attending: Student in an Organized Health Care Education/Training Program | Admitting: Student in an Organized Health Care Education/Training Program

## 2019-10-15 DIAGNOSIS — M4722 Other spondylosis with radiculopathy, cervical region: Secondary | ICD-10-CM | POA: Diagnosis not present

## 2019-10-15 DIAGNOSIS — Z8669 Personal history of other diseases of the nervous system and sense organs: Secondary | ICD-10-CM

## 2019-10-15 DIAGNOSIS — M5136 Other intervertebral disc degeneration, lumbar region: Secondary | ICD-10-CM

## 2019-10-15 DIAGNOSIS — M47816 Spondylosis without myelopathy or radiculopathy, lumbar region: Secondary | ICD-10-CM

## 2019-10-15 DIAGNOSIS — Z981 Arthrodesis status: Secondary | ICD-10-CM

## 2019-10-15 DIAGNOSIS — E1142 Type 2 diabetes mellitus with diabetic polyneuropathy: Secondary | ICD-10-CM

## 2019-10-15 NOTE — Progress Notes (Signed)
Pain Management Virtual Encounter Note - Virtual Visit via [place] (real-time audio visits between healthcare provider and patient).   Patient's Phone No. & Preferred Pharmacy:  [phone] (home); [phone] (mobile); (Preferred) [phone] [email or web address]  [facility] [id number], [address] [place] [address] Phone: [phone] Fax: [phone]    Pre-screening note:  Our staff contacted Brenda Hicks and offered her an "in person", "face-to-face" appointment versus a telephone encounter. She indicated preferring the telephone encounter, at this time.   Reason for Virtual Visit: COVID-19*  Social distancing based on CDC and AMA recommendations.   I contacted Brenda Hicks on 10/15/2019 via video conference.      I clearly identified myself as Gillis Santa, MD. I verified that I was speaking with the correct person using two identifiers (Name: Brenda Hicks, and date of birth: 07-20-1966).  Advanced Informed Consent I sought verbal advanced consent from Brenda Hicks for virtual visit interactions. I informed Brenda Hicks of possible security and privacy concerns, risks, and limitations associated with providing "not-in-person" medical evaluation and management services. I also informed Brenda Hicks of the availability of "in-person" appointments. Finally, I informed her that there would be a charge for the virtual visit and that she could be  personally, fully or partially, financially responsible for it. Brenda Hicks expressed understanding and agreed to proceed.   Historic Elements   Brenda Hicks is a 53 y.o. year old, female patient evaluated today after her last encounter by our practice on 09/24/2019. Brenda Hicks  has a past medical history of Anemia, Arthritis, Asthma, Cancer of ear, Diabetes mellitus without complication ([place]), Fatty liver, Hypertension, Kidney  cysts, Renal disorder, Sleep apnea, and Stroke [facility]) (May or June 2019). She also  has a past surgical history that includes Knee surgery (Right, 2014); Dilation and curettage of uterus; Endometrial biopsy; Exploratory laparotomy (1992); Hand surgery (Right, 1998); Tubal ligation; Knee arthroscopy (Right, 2012); Shoulder arthroscopy with open rotator cuff repair (Left, 12/14/2016); Shoulder arthroscopy with subacromial decompression (Left, 12/14/2016); Shoulder arthroscopy with bicepstenotomy (Left, 12/14/2016); Anterior cervical decomp/discectomy fusion (N/A, 08/09/2018); Joint replacement (Right, 2014); Hernia repair (1975); Back surgery; Shoulder arthroscopy with subacromial decompression and bicep tendon repair (Right, 01/04/2019); and Shoulder arthroscopy with rotator cuff repair (Right, 01/04/2019). Brenda Hicks has a current medication list which includes the following prescription(s): accu-chek fastclix lancets, accu-chek guide, albuterol, atorvastatin, accu-chek guide, estradiol, exenatide er, insulin glargine, insulin lispro, insulin syringe 1cc/31gx5/16", naproxen sodium, norethindrone, pregabalin, promethazine, quetiapine, tizanidine, trazodone, venlafaxine, and vitamin d (ergocalciferol). She  reports that she quit smoking about 2 years ago. Her smoking use included cigarettes. She has a 50.00 pack-year smoking history. She has never used smokeless tobacco. She reports that she does not drink alcohol or use drugs. Brenda Hicks is allergic to morphine and related and tramadol.   HPI  Today, she is being contacted for follow-up evaluation   Patient's previous and first visit with me was on 09/24/2019.  See detailed clinic note.  Of note patient has completed her lumbar MRI which shows L4-L5 and L5-S1 facet disease.  We discussed interventional options including diagnostic lumbar facet medial branch nerve block for her low back pain.  Risks and benefits reviewed and patient like to proceed.  In regards to  medication management, patient is not experiencing benefit with Lyrica at a dose of 50 mg twice daily.  I recommend that she  continue taking this medication as it has not been a full month and Lyrica can take up to 4 to 6 weeks to demonstrate an effect.  Med managed to continue with primary care provider  Again I also reinforced to the patient that we will not be offering any opioid analgesics given her morbid obesity and obstructive sleep apnea.  I also discussed the importance of managing her diabetes.  Previous A1c was 10.  Patient is also morbidly obese with a BMI of 56.35.  I counseled her on the importance of weight loss and exercise.  Laboratory Chemistry Profile (12 mo)  Renal: 08/07/2019: BUN/Creatinine Ratio NOT APPLICABLE [id number]: BUN 13; Creatinine, Ser 1.02  Lab Results  Component Value Date   GFRAA >60 08/14/2019   GFRNONAA >60 08/14/2019   Hepatic: 08/14/2019: Albumin 3.8 Lab Results  Component Value Date   AST 30 08/14/2019   ALT 32 08/14/2019   Other: No results found for requested labs within last 8760 hours. Note: Above Lab results reviewed.  Imaging  Last 90 days:  Mr Lumbar Spine Wo Contrast  Result Date: 10/14/2019 CLINICAL DATA:  Initial evaluation for chronic low back pain with left leg pain, getting worse. EXAM: MRI LUMBAR SPINE WITHOUT CONTRAST TECHNIQUE: Multiplanar, multisequence MR imaging of the lumbar spine was performed. No intravenous contrast was administered. COMPARISON:  None available. FINDINGS: Segmentation: Standard. Lowest well-formed disc space labeled the L5-S1 level. Alignment: Physiologic with preservation of the normal lumbar lordosis. No listhesis. Vertebrae: Vertebral body height maintained without evidence for acute or chronic fracture. Bone marrow signal intensity within normal limits. Small benign hemangioma noted within the L3 vertebral body. No other discrete or worrisome osseous lesions. No abnormal marrow edema. Conus medullaris and  cauda equina: Conus extends to the L1 level. Conus and cauda equina appear normal. Paraspinal and other soft tissues: Paraspinous soft tissues within normal limits. Visualized visceral structures grossly unremarkable. Disc levels: L1-2:  Unremarkable. L2-3:  Unremarkable. L3-4:  Unremarkable. L4-5: Mild diffuse disc bulge with disc desiccation. Moderate facet and ligament flavum hypertrophy. Associated small bilateral joint effusions. Resultant moderate canal with bilateral subarticular stenosis, worse on the left. Mild bilateral L4 foraminal narrowing, also worse on the left. L5-S1: Mild disc bulge, slightly asymmetric to the left. Mild bilateral facet hypertrophy. Resultant mild left lateral recess stenosis. Mild left L5 foraminal narrowing. No significant right foraminal encroachment. IMPRESSION: 1. Disc bulge with facet hypertrophy at L4-5 with resultant moderate canal and bilateral subarticular stenosis, left worse than right. 2. Mild left lateral recess and foraminal stenosis at L5-S1 related to disc bulge and facet disease. Electronically Signed   By: Jeannine Boga M.D.   On: 10/14/2019 07:29   Ct Abdomen Pelvis W Contrast  Result Date: 07/31/2019 CLINICAL DATA:  Right lower quadrant abdominal pain EXAM: CT ABDOMEN AND PELVIS WITH CONTRAST TECHNIQUE: Multidetector CT imaging of the abdomen and pelvis was performed using the standard protocol following bolus administration of intravenous contrast. CONTRAST:  167mL OMNIPAQUE IOHEXOL 300 MG/ML  SOLN COMPARISON:  Pelvic ultrasound dated 07/23/2019 FINDINGS: Lower chest: Lung bases are clear. Hepatobiliary: Severe hepatic steatosis with areas of focal fatty sparing. Gallbladder is unremarkable. No intrahepatic or extrahepatic ductal dilatation. Pancreas: Within normal limits. Spleen: Within normal limits. Adrenals/Urinary Tract: Adrenal glands are within normal limits. Kidneys are within normal limits.  No hydronephrosis. Bladder is underdistended but  unremarkable. Stomach/Bowel: Stomach is within normal limits. No evidence of bowel obstruction. Normal appendix (series 2/image 73). Lipomatous hypertrophy of the ileocecal valve (coronal  image 49). Left colonic diverticulosis, without evidence of diverticulitis. Vascular/Lymphatic: No evidence of abdominal aortic aneurysm. Mild atherosclerotic calcifications the abdominal aorta. No suspicious abdominopelvic lymphadenopathy. Reproductive: Endometrial thickening, measuring 2.3 cm. Suspected uterine fibroid in the left uterine body (series 2/image 82), poorly visualized. No adnexal masses. Other: No abdominopelvic ascites. Tiny fat containing left inguinal hernia. Musculoskeletal: Visualized osseous structures are within normal limits. IMPRESSION: No evidence of bowel obstruction. Normal appendix. Left colonic diverticulosis, without evidence of diverticulitis. Endometrial thickening, better evaluated on recent pelvic ultrasound. Endometrial sampling is suggested. Severe hepatic steatosis with areas of focal fatty sparing. Electronically Signed   By: Julian Hy M.D.   On: 07/31/2019 10:28   US Venous Img Lower Unilateral Left  Result Date: 08/07/2019 CLINICAL DATA:  Left lower extremity edema and calf pain. EXAM: LEFT LOWER EXTREMITY VENOUS DOPPLER ULTRASOUND TECHNIQUE: Gray-scale sonography with graded compression, as well as color Doppler and duplex ultrasound were performed to evaluate the lower extremity deep venous systems from the level of the common femoral vein and including the common femoral, femoral, profunda femoral, popliteal and calf veins including the posterior tibial, peroneal and gastrocnemius veins when visible. The superficial great saphenous vein was also interrogated. Spectral Doppler was utilized to evaluate flow at rest and with distal augmentation maneuvers in the common femoral, femoral and popliteal veins. COMPARISON:  Prior bilateral lower extremity venous duplex ultrasound on  03/30/2018 FINDINGS: Contralateral Common Femoral Vein: Respiratory phasicity is normal and symmetric with the symptomatic side. No evidence of thrombus. Normal compressibility. Common Femoral Vein: No evidence of thrombus. Normal compressibility, respiratory phasicity and response to augmentation. Saphenofemoral Junction: No evidence of thrombus. Normal compressibility and flow on color Doppler imaging. Profunda Femoral Vein: No evidence of thrombus. Normal compressibility and flow on color Doppler imaging. Femoral Vein: No evidence of thrombus. Normal compressibility, respiratory phasicity and response to augmentation. Popliteal Vein: No evidence of thrombus. Normal compressibility, respiratory phasicity and response to augmentation. Calf Veins: No evidence of thrombus. Normal compressibility and flow on color Doppler imaging. Superficial Great Saphenous Vein: No evidence of thrombus. Normal compressibility. Venous Reflux:  None. Other Findings: No evidence of superficial thrombophlebitis or abnormal fluid collection. IMPRESSION: No evidence of left lower extremity deep venous thrombosis. Electronically Signed   By: Aletta Edouard M.D.   On: 08/07/2019 13:54   US Pelvic Complete With Transvaginal  Result Date: 08/14/2019 CLINICAL DATA:  Vaginal bleeding. EXAM: TRANSABDOMINAL AND TRANSVAGINAL ULTRASOUND OF PELVIS TECHNIQUE: Both transabdominal and transvaginal ultrasound examinations of the pelvis were performed. Transabdominal technique was performed for global imaging of the pelvis including uterus, ovaries, adnexal regions, and pelvic cul-de-sac. It was necessary to proceed with endovaginal exam following the transabdominal exam to visualize the endometrium and adnexal regions. COMPARISON:  CT scan of July 31, 2019. Ultrasound of July 23, 2019. FINDINGS: Uterus Measurements: 11.5 x 6.5 x 6.3 cm = volume: 244 mL. 2.9 cm probable fibroid is seen anteriorly and on left side of uterus. Endometrium  Thickness: 2.4 cm which is abnormally thickened. Intrauterine device is noted in endometrial canal. Right ovary Not visualized. Left ovary Not visualized. Other findings No abnormal free fluid. IMPRESSION: If bleeding remains unresponsive to hormonal or medical therapy, focal lesion work-up with sonohysterogram should be considered. Endometrial biopsy should also be considered in pre-menopausal patients at high risk for endometrial carcinoma. (Ref: Radiological Reasoning: Algorithmic Workup of Abnormal Vaginal Bleeding with Endovaginal Sonography and Sonohysterography. [id number] [id number]). 2.9 cm uterine fibroid. Ovaries are not visualized. Electronically Signed  By: Marijo Conception M.D.   On: 08/14/2019 14:42   US Pelvic Complete With Transvaginal  Result Date: 07/23/2019 CLINICAL DATA:  Abnormal uterine bleeding, menorrhagia with irregular cycle, bleeding for 3 weeks EXAM: TRANSABDOMINAL AND TRANSVAGINAL ULTRASOUND OF PELVIS TECHNIQUE: Both transabdominal and transvaginal ultrasound examinations of the pelvis were performed. Transabdominal technique was performed for global imaging of the pelvis including uterus, ovaries, adnexal regions, and pelvic cul-de-sac. It was necessary to proceed with endovaginal exam following the transabdominal exam to visualize the endometrium and ovaries. Examination limited secondary to body habitus. COMPARISON:  05/01/2013 FINDINGS: Uterus Measurements: 10.6 x 5.6 x 6.7 cm = volume: 207 mL. Mildly heterogeneous myometrial echogenicity. Nabothian cysts at cervix. Small anterior LEFT lateral mid uterine leiomyoma 2.6 x 2.3 x 2.3 cm, intramural and question extending submucosal. Endometrium Thickness: 30 mm. Abnormal appearance, thickened, heterogeneous, with multiple tiny cystic foci. Small cystic collection 8 x 6 x 6 mm. Right ovary Not visualized on either transabdominal or endovaginal imaging, likely obscured by bowel Left ovary Not visualized on either transabdominal or  endovaginal imaging, likely obscured by bowel Other findings No free pelvic fluid.  No adnexal masses. IMPRESSION: Nonvisualization of ovaries. Small uterine leiomyoma 2.6 cm greatest size, question extending submucosal. Abnormal thickened and heterogeneous endometrial complex 30 mm thick with multiple tiny cystic areas; endometrial biopsy is recommended to exclude malignancy. If biopsy is negative, consider further evaluation by sonohysterography to exclude focal lesion. These results will be called to the ordering clinician or representative by the Radiologist Assistant, and communication documented in the PACS or zVision Dashboard. Electronically Signed   By: Lavonia Dana M.D.   On: 07/23/2019 17:11    Assessment  The primary encounter diagnosis was Lumbar facet joint syndrome. Diagnoses of Lumbar facet arthropathy, Lumbar degenerative disc disease, Osteoarthritis of spine with radiculopathy, cervical region, S/P cervical spinal fusion, Morbid obesity ([place]), History of migraine headaches, and Diabetic polyneuropathy associated with type 2 diabetes mellitus ([place]) were also pertinent to this visit.  Plan of Care  I am having Brenda Hicks maintain her albuterol, traZODone, Exenatide ER, promethazine, QUEtiapine, Vitamin D (Ergocalciferol), insulin lispro, naproxen sodium, INSULIN SYRINGE 1CC/31GX5/16", Accu-Chek Guide, Accu-Chek FastClix Lancets, Accu-Chek Guide, venlafaxine, insulin glargine, atorvastatin, estradiol, norethindrone, tiZANidine, and pregabalin.  Brenda Hicks has a history of greater than 3 months of moderate to severe pain which is resulted in functional impairment.  The patient has tried various conservative therapeutic options such as NSAIDs, Tylenol, muscle relaxants, physical therapy which was inadequately effective.  Patient's pain is predominantly axial with physical exam findings (documented in first clinic note) suggestive of facet arthropathy.  Lumbar facet medial branch  nerve blocks were discussed with the patient.  Risks and benefits were reviewed.  Patient would like to proceed with bilateral  L4, L5, S1 medial branch nerve block.  Orders:  Orders Placed This Encounter  Procedures  . L-FCT Blk (Schedule)    Standing Status:   Future    Standing Expiration Date:   11/14/2019    Scheduling Instructions:     Side: Bilateral     Level: L4-5, & L5-S1 Facets ( L4, L5, & S1 Medial Branch Nerves)     Sedation: with     Timeframe: ASAA    Order Specific Question:   Where will this procedure be performed?    Answer:   [facility]   Follow-up plan:   Return in about 2 weeks (around 10/29/2019) for B/L L4, 5, S1 Fcts ,  with sedation.     plan for b/l L4,5, S1 facets   Recent Visits Date Type Provider Dept  09/24/19 Office Visit Gillis Santa, MD [facility]  Showing recent visits within past 90 days and meeting all other requirements   Today's Visits Date Type Provider Dept  10/15/19 Office Visit Gillis Santa, MD [facility]  Showing today's visits and meeting all other requirements   Future Appointments No visits were found meeting these conditions.  Showing future appointments within next 90 days and meeting all other requirements   I discussed the assessment and treatment plan with the patient. The patient was provided an opportunity to ask questions and all were answered. The patient agreed with the plan and demonstrated an understanding of the instructions.  Patient advised to call back or seek an in-person evaluation if the symptoms or condition worsens.  Total duration of non-face-to-face encounter: 66minutes.  Note by: Gillis Santa, MD Date: 10/15/2019; Time: 11:38 AM  Note: This dictation was prepared with Dragon dictation. Any transcriptional errors that may result from this process are unintentional.  Disclaimer:  * Given the special circumstances of the COVID-19 pandemic, the federal government has  announced that the Office for Civil Rights (OCR) will exercise its enforcement discretion and will not impose penalties on physicians using telehealth in the event of noncompliance with regulatory requirements under the [place] and [place] (HIPAA) in connection with the good faith provision of telehealth during the XX123456 national public health emergency. ([place])

## 2019-10-17 ENCOUNTER — Ambulatory Visit: Payer: Medicaid Other | Admitting: Skilled Nursing Facility1

## 2019-10-24 ENCOUNTER — Encounter: Payer: Self-pay | Admitting: Family Medicine

## 2019-10-24 DIAGNOSIS — N946 Dysmenorrhea, unspecified: Secondary | ICD-10-CM

## 2019-10-24 MED ORDER — ALBUTEROL SULFATE HFA 108 (90 BASE) MCG/ACT IN AERS: 2.0000 | INHALATION_SPRAY | Freq: Four times a day (QID) | RESPIRATORY_TRACT | 3 refills | Status: DC | PRN

## 2019-10-25 NOTE — Addendum Note (Signed)
Addended by: Hubbard Hartshorn on: 10/25/2019 01:23 PM   Modules accepted: Orders

## 2019-10-31 ENCOUNTER — Encounter: Payer: Self-pay | Admitting: Student in an Organized Health Care Education/Training Program

## 2019-10-31 ENCOUNTER — Ambulatory Visit
Admission: RE | Admit: 2019-10-31 | Discharge: 2019-10-31 | Disposition: A | Discharge status: Home / Self Care | Payer: Medicaid Other | Source: Ambulatory Visit | Attending: Student in an Organized Health Care Education/Training Program | Admitting: Student in an Organized Health Care Education/Training Program

## 2019-10-31 ENCOUNTER — Ambulatory Visit (HOSPITAL_BASED_OUTPATIENT_CLINIC_OR_DEPARTMENT_OTHER): Payer: Medicaid Other | Admitting: Student in an Organized Health Care Education/Training Program

## 2019-10-31 ENCOUNTER — Other Ambulatory Visit: Payer: Self-pay

## 2019-10-31 VITALS — BP 121/56 | HR 84 | Temp 97.2°F | Resp 16 | Ht 66.0 in | Wt 324.0 lb

## 2019-10-31 DIAGNOSIS — M47816 Spondylosis without myelopathy or radiculopathy, lumbar region: Secondary | ICD-10-CM

## 2019-10-31 MED ORDER — DEXAMETHASONE SODIUM PHOSPHATE 10 MG/ML IJ SOLN
10.0000 mg | Freq: Once | INTRAMUSCULAR | Status: AC
  Administered 2019-10-31: 11:00:00 10 mg

## 2019-10-31 MED ORDER — LIDOCAINE HCL 2 % IJ SOLN
INTRAMUSCULAR | Status: AC
  Filled 2019-10-31: qty 20

## 2019-10-31 MED ORDER — DEXAMETHASONE SODIUM PHOSPHATE 10 MG/ML IJ SOLN
INTRAMUSCULAR | Status: AC
  Filled 2019-10-31: qty 2

## 2019-10-31 MED ORDER — DIAZEPAM 5 MG PO TABS
5.0000 mg | ORAL_TABLET | Freq: Once | ORAL | Status: AC
  Administered 2019-10-31: 5 mg via ORAL

## 2019-10-31 MED ORDER — DIAZEPAM 5 MG PO TABS
ORAL_TABLET | ORAL | Status: AC
  Filled 2019-10-31: qty 1

## 2019-10-31 MED ORDER — ROPIVACAINE HCL 2 MG/ML IJ SOLN
9.0000 mL | Freq: Once | INTRAMUSCULAR | Status: AC
  Administered 2019-10-31: 11:00:00 9 mL via PERINEURAL

## 2019-10-31 MED ORDER — ROPIVACAINE HCL 2 MG/ML IJ SOLN
INTRAMUSCULAR | Status: AC
  Filled 2019-10-31: qty 20

## 2019-10-31 MED ORDER — LIDOCAINE HCL 2 % IJ SOLN
20.0000 mL | Freq: Once | INTRAMUSCULAR | Status: AC
  Administered 2019-10-31: 11:00:00 400 mg

## 2019-10-31 NOTE — Progress Notes (Signed)
Safety precautions to be maintained throughout the outpatient stay will include: orient to surroundings, keep bed in low position, maintain call bell within reach at all times, provide assistance with transfer out of bed and ambulation.  

## 2019-10-31 NOTE — Patient Instructions (Signed)

## 2019-10-31 NOTE — Progress Notes (Signed)
Patient's Name: Brenda Hicks  MRN: [id number]  Referring Provider: Hubbard Hartshorn, FNP  DOB: November 04, 1966  PCP: Hubbard Hartshorn, FNP  DOS: 10/31/2019  Note by: Gillis Santa, MD  Service setting: Ambulatory outpatient  Specialty: Interventional Pain Management  Patient type: Established  Location: [facility] (AMB) Pain Management Facility  Visit type: Interventional Procedure   Primary Reason for Visit: Interventional Pain Management Treatment. CC: Back Pain (lower)  Procedure:          Anesthesia, Analgesia, Anxiolysis:  Type: Lumbar Facet, Medial Branch Block(s) #1  Primary Purpose: Diagnostic Region: Posterolateral Lumbosacral Spine Level: L4, L5, & S1 Medial Branch Level(s). Injecting these levels blocks the  L4-5, and L5-S1 lumbar facet joints. Laterality: Bilateral  Type: PO Valium Local Anesthetic: Lidocaine 1-2%  Position: Prone   Indications: 1. Lumbar facet joint syndrome   2. Lumbar facet arthropathy    Pain Score: Pre-procedure: 10-Worst pain ever/10 Post-procedure: 4/10   Pre-op Assessment:  Brenda Hicks is a 53 y.o. (year old), female patient, seen today for interventional treatment. She  has a past surgical history that includes Knee surgery (Right, 2014); Dilation and curettage of uterus; Endometrial biopsy; Exploratory laparotomy (1992); Hand surgery (Right, 1998); Tubal ligation; Knee arthroscopy (Right, 2012); Shoulder arthroscopy with open rotator cuff repair (Left, 12/14/2016); Shoulder arthroscopy with subacromial decompression (Left, 12/14/2016); Shoulder arthroscopy with bicepstenotomy (Left, 12/14/2016); Anterior cervical decomp/discectomy fusion (N/A, 08/09/2018); Joint replacement (Right, 2014); Hernia repair (1975); Back surgery; Shoulder arthroscopy with subacromial decompression and bicep tendon repair (Right, 01/04/2019); and Shoulder arthroscopy with rotator cuff repair (Right, 01/04/2019). [name] has a current medication list which includes the following prescription(s):  accu-chek fastclix lancets, accu-chek guide, albuterol, atorvastatin, accu-chek guide, estradiol, exenatide er, insulin glargine, insulin lispro, insulin syringe 1cc/31gx5/16", naproxen sodium, pregabalin, promethazine, quetiapine, trazodone, vitamin d (ergocalciferol), norethindrone, tizanidine, and venlafaxine, and the following Facility-Administered Medications: diazepam. Her primarily concern today is the Back Pain (lower)  Initial Vital Signs:  Pulse/HCG Rate: 84ECG Heart Rate: 87 Temp: (!) 97.2 F (36.2 C) Resp: 18 BP: 137/81 SpO2: 100 %  BMI: Estimated body mass index is 52.29 kg/m as calculated from the following:   Height as of this encounter: 5\' 6"  (1.676 m).   Weight as of this encounter: 324 lb (147 kg).  Risk Assessment: Allergies: Reviewed. She is allergic to morphine and related and tramadol.  Allergy Precautions: None required Coagulopathies: Reviewed. None identified.  Blood-thinner therapy: None at this time Active Infection(s): Reviewed. None identified. Brenda Hicks is afebrile  Site Confirmation: Brenda Hicks was asked to confirm the procedure and laterality before marking the site Procedure checklist: Completed Consent: Before the procedure and under the influence of no sedative(s), amnesic(s), or anxiolytics, the patient was informed of the treatment options, risks and possible complications. To fulfill our ethical and legal obligations, as recommended by the American Medical Association's Code of Ethics, I have informed the patient of my clinical impression; the nature and purpose of the treatment or procedure; the risks, benefits, and possible complications of the intervention; the alternatives, including doing nothing; the risk(s) and benefit(s) of the alternative treatment(s) or procedure(s); and the risk(s) and benefit(s) of doing nothing. The patient was provided information about the general risks and possible complications associated with the procedure. These may  include, but are not limited to: failure to achieve desired goals, infection, bleeding, organ or nerve damage, allergic reactions, paralysis, and death. In addition, the patient was informed of those risks and complications associated to Spine-related procedures, such  as failure to decrease pain; infection (i.e.: Meningitis, epidural or intraspinal abscess); bleeding (i.e.: epidural hematoma, subarachnoid hemorrhage, or any other type of intraspinal or peri-dural bleeding); organ or nerve damage (i.e.: Any type of peripheral nerve, nerve root, or spinal cord injury) with subsequent damage to sensory, motor, and/or autonomic systems, resulting in permanent pain, numbness, and/or weakness of one or several areas of the body; allergic reactions; (i.e.: anaphylactic reaction); and/or death. Furthermore, the patient was informed of those risks and complications associated with the medications. These include, but are not limited to: allergic reactions (i.e.: anaphylactic or anaphylactoid reaction(s)); adrenal axis suppression; blood sugar elevation that in diabetics may result in ketoacidosis or comma; water retention that in patients with history of congestive heart failure may result in shortness of breath, pulmonary edema, and decompensation with resultant heart failure; weight gain; swelling or edema; medication-induced neural toxicity; particulate matter embolism and blood vessel occlusion with resultant organ, and/or nervous system infarction; and/or aseptic necrosis of one or more joints. Finally, the patient was informed that Medicine is not an exact science; therefore, there is also the possibility of unforeseen or unpredictable risks and/or possible complications that may result in a catastrophic outcome. The patient indicated having understood very clearly. We have given the patient no guarantees and we have made no promises. Enough time was given to the patient to ask questions, all of which were answered  to the patient's satisfaction. Brenda Hicks has indicated that she wanted to continue with the procedure. Attestation: I, the ordering provider, attest that I have discussed with the patient the benefits, risks, side-effects, alternatives, likelihood of achieving goals, and potential problems during recovery for the procedure that I have provided informed consent. Date   Time: 10/31/2019  9:55 AM  Pre-Procedure Preparation:  Monitoring: As per clinic protocol. Respiration, ETCO2, SpO2, BP, heart rate and rhythm monitor placed and checked for adequate function Safety Precautions: Patient was assessed for positional comfort and pressure points before starting the procedure. Time-out: I initiated and conducted the "Time-out" before starting the procedure, as per protocol. The patient was asked to participate by confirming the accuracy of the "Time Out" information. Verification of the correct person, site, and procedure were performed and confirmed by me, the nursing staff, and the patient. "Time-out" conducted as per Joint Commission's Universal Protocol (UP.01.01.01). Time: 1048  Description of Procedure:          Laterality: Bilateral. The procedure was performed in identical fashion on both sides. Levels:  L4, L5, & S1 Medial Branch Level(s) Area Prepped: Posterior Lumbosacral Region Prepping solution: DuraPrep (Iodine Povacrylex [0.7% available iodine] and Isopropyl Alcohol, 74% w/w) Safety Precautions: Aspiration looking for blood return was conducted prior to all injections. At no point did we inject any substances, as a needle was being advanced. Before injecting, the patient was told to immediately notify me if she was experiencing any new onset of "ringing in the ears, or metallic taste in the mouth". No attempts were made at seeking any paresthesias. Safe injection practices and needle disposal techniques used. Medications properly checked for expiration dates. SDV (single dose vial) medications  used. After the completion of the procedure, all disposable equipment used was discarded in the proper designated medical waste containers. Local Anesthesia: Protocol guidelines were followed. The patient was positioned over the fluoroscopy table. The area was prepped in the usual manner. The time-out was completed. The target area was identified using fluoroscopy. A 12-in long, straight, sterile hemostat was used with fluoroscopic guidance to  locate the targets for each level blocked. Once located, the skin was marked with an approved surgical skin marker. Once all sites were marked, the skin (epidermis, dermis, and hypodermis), as well as deeper tissues (fat, connective tissue and muscle) were infiltrated with a small amount of a short-acting local anesthetic, loaded on a 10cc syringe with a 25G, 1.5-in  Needle. An appropriate amount of time was allowed for local anesthetics to take effect before proceeding to the next step. Local Anesthetic: Lidocaine 2.0% The unused portion of the local anesthetic was discarded in the proper designated containers. Technical explanation of process:   L4 Medial Branch Nerve Block (MBB): The target area for the L4 medial branch is at the junction of the postero-lateral aspect of the superior articular process and the superior, posterior, and medial edge of the transverse process of L5. Under fluoroscopic guidance, a Quincke needle was inserted until contact was made with os over the superior postero-lateral aspect of the pedicular shadow (target area). After negative aspiration for blood, 54mL of the nerve block solution was injected without difficulty or complication. The needle was removed intact. L5 Medial Branch Nerve Block (MBB): The target area for the L5 medial branch is at the junction of the postero-lateral aspect of the superior articular process and the superior, posterior, and medial edge of the sacral ala. Under fluoroscopic guidance, a Quincke needle was inserted  until contact was made with os over the superior postero-lateral aspect of the pedicular shadow (target area). After negative aspiration for blood, 55mL of the nerve block solution was injected without difficulty or complication. The needle was removed intact. S1 Medial Branch Nerve Block (MBB): The target area for the S1 medial branch is at the posterior and inferior 6 o'clock position of the L5-S1 facet joint. Under fluoroscopic guidance, the Quincke needle inserted for the L5 MBB was redirected until contact was made with os over the inferior and postero aspect of the sacrum, at the 6 o' clock position under the L5-S1 facet joint (Target area). After negative aspiration for blood, 1 mL of the nerve block solution was injected without difficulty or complication. The needle was removed intact.  Nerve block solution: 6 cc solution made of 5 cc of 0.2% ropivacaine, 1 cc of Decadron 10 mg/cc.  1 cc injected at each level above bilaterally.  The unused portion of the solution was discarded in the proper designated containers. Procedural Needles: 22-gauge, 5-inch, Quincke needles used for all levels.  Once the entire procedure was completed, the treated area was cleaned, making sure to leave some of the prepping solution back to take advantage of its long term bactericidal properties.   Illustration of the posterior view of the lumbar spine and the posterior neural structures. Laminae of L2 through S1 are labeled. DPRL5, dorsal primary ramus of L5; DPRS1, dorsal primary ramus of S1; DPR3, dorsal primary ramus of L3; FJ, facet (zygapophyseal) joint L3-L4; I, inferior articular process of L4; LB1, lateral branch of dorsal primary ramus of L1; IAB, inferior articular branches from L3 medial branch (supplies L4-L5 facet joint); IBP, intermediate branch plexus; MB3, medial branch of dorsal primary ramus of L3; NR3, third lumbar nerve root; S, superior articular process of L5; SAB, superior articular branches from L4  (supplies L4-5 facet joint also); TP3, transverse process of L3.  Vitals:   10/31/19 1052 10/31/19 1057 10/31/19 1102 10/31/19 1105  BP: (!) 113/93 (!) 153/90 (!) 155/91 (!) 121/56  Pulse:      Resp: 16 16  16 16  Temp:      TempSrc:      SpO2: 100% 98% 97% 100%  Weight:      Height:         Start Time: 1048 hrs. End Time: 1103 hrs.  Imaging Guidance (Spinal):          Type of Imaging Technique: Fluoroscopy Guidance (Spinal) Indication(s): Assistance in needle guidance and placement for procedures requiring needle placement in or near specific anatomical locations not easily accessible without such assistance. Exposure Time: Please see nurses notes. Contrast: None used. Fluoroscopic Guidance: I was personally present during the use of fluoroscopy. "Tunnel Vision Technique" used to obtain the best possible view of the target area. Parallax error corrected before commencing the procedure. "Direction-depth-direction" technique used to introduce the needle under continuous pulsed fluoroscopy. Once target was reached, antero-posterior, oblique, and lateral fluoroscopic projection used confirm needle placement in all planes. Images permanently stored in EMR. Interpretation: No contrast injected. I personally interpreted the imaging intraoperatively. Adequate needle placement confirmed in multiple planes. Permanent images saved into the patient's record.  Antibiotic Prophylaxis:   Anti-infectives (From admission, onward)   None     Indication(s): None identified  Post-operative Assessment:  Post-procedure Vital Signs:  Pulse/HCG Rate: 8496 Temp: (!) 97.2 F (36.2 C) Resp: 16 BP: (!) 121/56 SpO2: 100 %  EBL: None  Complications: No immediate post-treatment complications observed by team, or reported by patient.  Note: The patient tolerated the entire procedure well. A repeat set of vitals were taken after the procedure and the patient was kept under observation following  institutional policy, for this type of procedure. Post-procedural neurological assessment was performed, showing return to baseline, prior to discharge. The patient was provided with post-procedure discharge instructions, including a section on how to identify potential problems. Should any problems arise concerning this procedure, the patient was given instructions to immediately contact us, at any time, without hesitation. In any case, we plan to contact the patient by telephone for a follow-up status report regarding this interventional procedure.  Comments:  No additional relevant information.  Plan of Care  Orders:  Orders Placed This Encounter  Procedures   Fluoro (C-Arm) (<60 min) (No Report)    Intraoperative interpretation by procedural physician at [facility].    Standing Status:   Standing    Number of Occurrences:   1    Order Specific Question:   Reason for exam:    Answer:   Assistance in needle guidance and placement for procedures requiring needle placement in or near specific anatomical locations not easily accessible without such assistance.   Medications ordered for procedure: Meds ordered this encounter  Medications   lidocaine (XYLOCAINE) 2 % (with pres) injection 400 mg   ropivacaine (PF) 2 mg/mL (0.2%) (NAROPIN) injection 9 mL   dexamethasone (DECADRON) injection 10 mg   diazepam (VALIUM) tablet 5 mg   Medications administered: We administered lidocaine, ropivacaine (PF) 2 mg/mL (0.2%), and dexamethasone.  See the medical record for exact dosing, route, and time of administration.  Follow-up plan:   Return in about 5 weeks (around 12/05/2019) for Post Procedure Evaluation, virtual.      10/31/2019 for b/l L4,5, S1 facets    Recent Visits Date Type Provider Dept  10/15/19 Office Visit Gillis Santa, MD [facility]  09/24/19 Office Visit Gillis Santa, MD [facility]  Showing recent visits within past 90 days and meeting all  other requirements   Today's Visits Date Type  Provider Dept  10/31/19 Procedure visit Gillis Santa, MD [facility]  Showing today's visits and meeting all other requirements   Future Appointments Date Type Provider Dept  12/10/19 Appointment Gillis Santa, MD [facility]  Showing future appointments within next 90 days and meeting all other requirements   Disposition: Discharge home  Discharge Date & Time: 10/31/2019; 1108 hrs.   Primary Care Physician: Hubbard Hartshorn, FNP Location: [facility] Note by: Gillis Santa, MD Date: 10/31/2019; Time: 12:14 PM  Disclaimer:  Medicine is not an exact science. The only guarantee in medicine is that nothing is guaranteed. It is important to note that the decision to proceed with this intervention was based on the information collected from the patient. The Data and conclusions were drawn from the patient's questionnaire, the interview, and the physical examination. Because the information was provided in large part by the patient, it cannot be guaranteed that it has not been purposely or unconsciously manipulated. Every effort has been made to obtain as much relevant data as possible for this evaluation. It is important to note that the conclusions that lead to this procedure are derived in large part from the available data. Always take into account that the treatment will also be dependent on availability of resources and existing treatment guidelines, considered by other Pain Management Practitioners as being common knowledge and practice, at the time of the intervention. For Medico-Legal purposes, it is also important to point out that variation in procedural techniques and pharmacological choices are the acceptable norm. The indications, contraindications, technique, and results of the above procedure should only be interpreted and judged by a Board-Certified Interventional Pain Specialist with extensive  familiarity and expertise in the same exact procedure and technique.

## 2019-11-01 ENCOUNTER — Telehealth: Payer: Self-pay

## 2019-11-01 NOTE — Telephone Encounter (Signed)
Post procedure phone call.  Patient statesshe is having some muscle spasms but is taking the muscle relaxer [prescribed.  Instructed to call back for any further questions or concerns.

## 2019-11-05 NOTE — Progress Notes (Signed)
Hubbard Hartshorn, FNP   Chief Complaint  Patient presents with  . Vaginal Bleeding    everyday bleeding sincy July, passing huge clots, cramping is horrible    HPI:      Brenda Hicks is a 53 y.o. [id number] who LMP was Patient's last menstrual period was 07/17/2019 (approximate)., presents today for NP eval of AUB/cramping on IUD, placed ~9/20. Referred by PCP. Had Mirena removed 2/20 with [facility] due to expiration. Amenorrheic for 6 yrs with Mirena. No bleeding till 7/20, and has been daily, heavy changing products Q1-2 hrs and with large clots and cramping. NSAIDs don't help dysmen.  Mirena placed again 08/03/19 for cycle control. No bleeding improvement since placement, in fact bleeding worse often times. Pt given estrace 1 mg for 3 wks 9/20 without sx relief. Then changed to aygestin 5 mg BID for 3 wks 10/20 without sx change. Still bleeding and wants further eval. Would prefer to have hyst but told too young in past and not good surgical candidate recently due to medical conditions.   "[place] u/s 9/20 EM= 2.4 cm which is abnormally thickened. Intrauterine device is noted in endometrial canal." Had normal TSH and CBC 9/20. Premenopausal FSH/LSH 8/20.  Hx of AUB her "whole life". Controlled in past with 2 depo injections (300 mg dose--done here at [facility] yrs ago) and mirena. Not sex active since AUB started. Had dyspareunia prior to bleeding.  Having issues with loose stools. No n/v/constipation. Has LLQ pain. No prior abd surg. No FH breast/ovar/uterine/colon cancer.  Last pap normal 01/29/19.  Had colonoscopy in past, thinks due again now. Mult meds probs managed by PCP   Patient Active Problem List   Diagnosis Date Noted  . S/P cervical spinal fusion 09/24/2019  . Hyperlipidemia associated with type 2 diabetes mellitus ([place]) 08/08/2019  . Low back pain radiating to left lower extremity 08/07/2019  . Cervical myelopathy ([place]) 08/09/2018  . DJD (degenerative joint disease) of  cervical spine 08/04/2018  . 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[place]) 08/03/2018  . History of ischemic stroke 03/30/2018  . Ischemic chest pain ([place]) 03/29/2018  . Chest pain 03/29/2018  . Sebaceous cyst 06/01/2016  . Obstructive apnea 05/31/2016  . Acid reflux 05/31/2016  . Essential (primary) hypertension 05/31/2016  . Abnormal Pap smear of cervix 05/31/2016  . Arthritis of knee, degenerative 07/25/2015  . History of artificial joint 07/25/2015  . Gonalgia 02/17/2015  . Type 2 diabetes mellitus ([place]) 10/23/2014  . 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 Diabetic polyneuropathy associated with type 2 diabetes mellitus ([place]) 05/16/2014  . Endometrial polyp 11/01/2013  . Fibroids, intramural 10/09/2013  . Pain due to knee joint prosthesis ([place]) 10/05/2013  . Body mass index (BMI) of 50-59.9 in adult ([place]) 10/03/2013  . Abnormal uterine bleeding 10/03/2013  . Morbid obesity ([place]) 10/01/2013  . Excessive and frequent menstruation with irregular cycle 10/01/2013  . Adaptive colitis 10/01/2013  . History of migraine headaches 10/01/2013  . H/O malignant neoplasm of skin 10/01/2013  . Fatty liver disease, nonalcoholic 98/33/8250  . Diverticulitis 10/01/2013  . Former smoker 08/29/2013  . H/O total knee replacement 08/29/2013  . Insomnia 08/29/2013  . Dysmenorrhea 08/29/2013  . Airway hyperreactivity 08/29/2013  . Absolute anemia 08/29/2013    Past Surgical History:  Procedure Laterality Date  . ANTERIOR CERVICAL DECOMP/DISCECTOMY FUSION N/A 08/09/2018   Procedure: ANTERIOR CERVICAL DECOMPRESSION/DISCECTOMY FUSION 1 LEVEL- C4-5;  Surgeon: Izora Ribas,  Ardyth Gal, MD;  Location: [facility];  Service: Neurosurgery;  Laterality: N/A;  . BACK SURGERY    . DILATION AND CURETTAGE OF UTERUS    . ENDOMETRIAL BIOPSY     benign  . EXPLORATORY LAPAROTOMY  1992   REMOVAL OF RUPTURED ECTOPIC  . HAND SURGERY Right  1998   cyst removed  . HERNIA REPAIR  0539   UMBILICAL  . JOINT REPLACEMENT Right 2014   TKR  . KNEE ARTHROSCOPY Right 2012  . KNEE SURGERY Right 2014   total knee replacement  . SHOULDER ARTHROSCOPY WITH BICEPSTENOTOMY Left 12/14/2016   Procedure: SHOULDER ARTHROSCOPY WITH BICEPSTENOTOMY;  Surgeon: Corky Mull, MD;  Location: [facility];  Service: Orthopedics;  Laterality: Left;  . SHOULDER ARTHROSCOPY WITH OPEN ROTATOR CUFF REPAIR Left 12/14/2016   Procedure: SHOULDER ARTHROSCOPY WITH OPEN ROTATOR CUFF REPAIR AND ARTHROSCOPIC ROTATOR CUFF REPAIR;  Surgeon: Corky Mull, MD;  Location: [facility];  Service: Orthopedics;  Laterality: Left;  . SHOULDER ARTHROSCOPY WITH ROTATOR CUFF REPAIR Right 01/04/2019   Procedure: SHOULDER ARTHROSCOPY WITH ROTATOR CUFF REPAIR;  Surgeon: Corky Mull, MD;  Location: [facility];  Service: Orthopedics;  Laterality: Right;  . SHOULDER ARTHROSCOPY WITH SUBACROMIAL DECOMPRESSION Left 12/14/2016   Procedure: SHOULDER ARTHROSCOPY WITH SUBACROMIAL DECOMPRESSION;  Surgeon: Corky Mull, MD;  Location: [facility];  Service: Orthopedics;  Laterality: Left;  . SHOULDER ARTHROSCOPY WITH SUBACROMIAL DECOMPRESSION AND BICEP TENDON REPAIR Right 01/04/2019   Procedure: SHOULDER ARTHROSCOPY WITH DEBRIDEMENT AND SUBACROMIAL DECOMPRESSION-RIGHT;  Surgeon: Corky Mull, MD;  Location: [facility];  Service: Orthopedics;  Laterality: Right;  . TUBAL LIGATION      Family History  Problem Relation Age of Onset  . Hypertension Mother   . CAD Maternal Grandmother   . Breast cancer Neg Hx   . Ovarian cancer Neg Hx   . Colon cancer Neg Hx     Social History   Socioeconomic History  . Marital status: Married    Spouse name: brian  . Number of children: 7  . Years of education: Not on file  . Highest education level: Not on file  Occupational History  . Occupation: disabled    Comment: disabled  Tobacco Use  . Smoking status: Former Smoker    Packs/day: 2.00    Years: 25.00     Pack years: 50.00    Types: Cigarettes    Quit date: 06/09/2017    Years since quitting: 2.4  . Smokeless tobacco: Never Used  Substance and Sexual Activity  . Alcohol use: No  . Drug use: No  . Sexual activity: Not Currently    Partners: Male    Birth control/protection: I.U.D.    Comment: Mirena  Other Topics Concern  . Not on file  Social History Narrative   Have 6 biological childrean and custody of niece since 24 months.   Social Determinants of Health   Financial Resource Strain: Low Risk   . Difficulty of Paying Living Expenses: Not hard at all  Food Insecurity: No Food Insecurity  . Worried About Charity fundraiser in the Last Year: Never true  . Ran Out of Food in the Last Year: Never true  Transportation Needs: No Transportation Needs  . Lack of Transportation (Medical): No  . Lack of Transportation (Non-Medical): No  Physical Activity: Inactive  . Days of Exercise per Week: 0 days  . Minutes of Exercise per Session: 0 min  Stress: No Stress Concern Present  . Feeling of Stress :  Not at all  Social Connections: Not Isolated  . Frequency of Communication with Friends and Family: More than three times a week  . Frequency of Social Gatherings with Friends and Family: Never  . Attends Religious Services: More than 4 times per year  . Active Member of Clubs or Organizations: Yes  . Attends Archivist Meetings: More than 4 times per year  . Marital Status: Married  Human resources officer Violence: Not At Risk  . Fear of Current or Ex-Partner: No  . Emotionally Abused: No  . Physically Abused: No  . Sexually Abused: No    Outpatient Medications Prior to Visit  Medication Sig Dispense Refill  . Accu-Chek FastClix Lancets MISC TEST TID    . ACCU-CHEK GUIDE test strip U TID UTD    . albuterol (VENTOLIN HFA) 108 (90 Base) MCG/ACT inhaler Inhale 2 puffs into the lungs every 6 (six) hours as needed for wheezing or shortness of breath. 18 g 3  . atorvastatin  (LIPITOR) 20 MG tablet Take 1 tablet (20 mg total) by mouth daily. 90 tablet 3  . Blood Glucose Monitoring Suppl (ACCU-CHEK GUIDE) w/Device KIT U UTD TID FOR FINGERSTICK TESTING    . estradiol (ESTRACE) 1 MG tablet Take 1 tablet (1 mg total) by mouth daily. 21 tablet 0  . Exenatide ER (BYDUREON) 2 MG PEN Inject 2 mg into the skin every Sunday.     . insulin glargine (LANTUS) 100 UNIT/ML injection Inject 1 mL (100 Units total) into the skin daily. 10 mL 11  . insulin lispro (HUMALOG) 100 UNIT/ML injection Inject 30 Units into the skin 3 (three) times daily before meals.    . Insulin Syringe-Needle U-100 (INSULIN SYRINGE 1CC/31GX5/16") 31G X 5/16" 1 ML MISC USE 1 QID    . naproxen sodium (ALEVE) 220 MG tablet Take 220 mg by mouth daily as needed.    . pregabalin (LYRICA) 50 MG capsule Take 1 capsule (50 mg total) by mouth at bedtime for 30 days, THEN 1 capsule (50 mg total) 2 (two) times daily. 150 capsule 0  . promethazine (PHENERGAN) 25 MG tablet Take 1 tablet (25 mg total) by mouth every 6 (six) hours as needed for nausea or vomiting. 20 tablet 0  . QUEtiapine (SEROQUEL) 50 MG tablet Take 50 mg by mouth at bedtime.    [place] Kitchen tiZANidine (ZANAFLEX) 4 MG tablet Take 1.5 tablets (6 mg total) by mouth 2 (two) times daily as needed for muscle spasms. 60 tablet 2  . traZODone (DESYREL) 50 MG tablet Take 50 mg by mouth at bedtime as needed for sleep.   5  . Vitamin D, Ergocalciferol, (DRISDOL) 1.25 MG (50000 UT) CAPS capsule Take 50,000 Units by mouth every Monday.    . norethindrone (AYGESTIN) 5 MG tablet Take 1 tablet (5 mg total) by mouth 2 (two) times daily for 21 days. As directed (Patient not taking: Reported on 09/24/2019) 42 tablet 0  . venlafaxine (EFFEXOR) 37.5 MG tablet Take by mouth.     No facility-administered medications prior to visit.      ROS:  Review of Systems  Constitutional: Positive for fatigue. Negative for fever and unexpected weight change.  Respiratory: Negative for cough,  shortness of breath and wheezing.   Cardiovascular: Negative for chest pain, palpitations and leg swelling.  Gastrointestinal: Negative for blood in stool, constipation, diarrhea, nausea and vomiting.  Endocrine: Negative for cold intolerance, heat intolerance and polyuria.  Genitourinary: Positive for dyspareunia, menstrual problem, pelvic pain and vaginal discharge.  Negative for dysuria, flank pain, frequency, genital sores, hematuria, urgency, vaginal bleeding and vaginal pain.  Musculoskeletal: Positive for arthralgias. Negative for back pain, joint swelling and myalgias.  Skin: Negative for rash.  Neurological: Positive for headaches. Negative for dizziness, syncope, light-headedness and numbness.  Hematological: Negative for adenopathy.  Psychiatric/Behavioral: Negative for agitation, confusion, sleep disturbance and suicidal ideas. The patient is not nervous/anxious.   BREAST: No symptoms   OBJECTIVE:   Vitals:  Ht '5\' 6"'  (1.676 m)   Wt (!) 349 lb (158.3 kg)   LMP 07/17/2019 (Approximate) Comment: Since July  BMI 56.33 kg/m   Physical Exam Vitals reviewed.  Constitutional:      Appearance: She is well-developed.  Pulmonary:     Effort: Pulmonary effort is normal.  Abdominal:     Palpations: Abdomen is soft.     Tenderness: There is abdominal tenderness in the right lower quadrant, suprapubic area and left lower quadrant. There is no guarding or rebound.  Genitourinary:    General: Normal vulva.     Pubic Area: No rash.      Labia:        Right: No rash, tenderness or lesion.        Left: No rash, tenderness or lesion.      Vagina: Bleeding present. No vaginal discharge, erythema or tenderness.     Cervix: Normal.     Uterus: Normal. Not enlarged and not tender.      Adnexa: Right adnexa normal and left adnexa normal.       Right: No mass or tenderness.         Left: No mass or tenderness.       Comments: IUD STRINGS NOT SEEN IN CX OS Musculoskeletal:        General:  Normal range of motion.     Cervical back: Normal range of motion.  Skin:    General: Skin is warm and dry.  Neurological:     General: No focal deficit present.     Mental Status: She is alert and oriented to person, place, and time.  Psychiatric:        Mood and Affect: Mood normal.        Behavior: Behavior normal.        Thought Content: Thought content normal.        Judgment: Judgment normal.     Results: Results for orders placed or performed in visit on 11/06/19 (from the past 24 hour(s))  POCT hemoglobin     Status: Normal   Collection Time: 11/06/19  9:57 AM  Result Value Ref Range   Hemoglobin 11.7 11 - 14.6 g/dL   ULTRASOUND REPORT  Location: [facility]  Date of Service: 11/06/2019    Indications:Abnormal Uterine Bleeding Findings:  The uterus is anteverted and measures 10.8 x 6.7 x 5.9cm. Echo texture is heterogenous with evidence of focal mass. Within the uterus there is one suspected fibroid measuring: Fibroid 1:  3.8 x 2.7 x 2.6cm (IM, LT)  The Endometrium is thickened and heterogeneous measuring 13.8 mm. IUD is not seen.   Bilateral ovaries are not seen due to body habitus. Survey of the adnexa demonstrates no adnexal masses. There is no free fluid in the cul de sac.  Impression: 1. Intramural fibroid again seen 3.8cm. 2. Thickened and heterogeneous endometrial canal. IUD is not seen.   Recommendations: 1.Clinical correlation with the patient's History and Physical Exam.   Vita Barley, RT  Endometrial Biopsy After discussion with  the patient regarding her abnormal uterine bleeding I recommended that she proceed with an endometrial biopsy for further diagnosis. The risks, benefits, alternatives, and indications for an endometrial biopsy were discussed with the patient in detail. She understood the risks including infection, bleeding, cervical laceration and uterine perforation.  Verbal consent was obtained.   PROCEDURE NOTE:   [place] endometrial biopsy was performed using aseptic technique with iodine preparation.  The uterus was sounded to a length of 11.0 cm.  Adequate sampling was obtained with minimal blood loss.  The patient tolerated the procedure well.  Disposition will be pending pathology.    Assessment/Plan: Abnormal uterine bleeding (AUB) - Plan: POCT hemoglobin, medroxyPROGESTERone (PROVERA) 10 MG tablet, Surgical pathology; IUD not found on GYN u/s. No strings in cx os on exam. WNL HgB. Thickened EM on u/s. EMB done today. Will call with results. If neg, discussed another IUD, depo, ablation, hyst.   Thickened endometrium - Plan: Surgical pathology; EMB today.  Pelvic pain--tender on exam, neg GYN u/s. Could be GI due to recent diarrhea.  Encounter for routine checking of intrauterine contraceptive device (IUD)--strings not in cx os, no IUD seen on u/s  Intrauterine contraceptive device threads lost, initial encounter    Meds ordered this encounter  Medications  . medroxyPROGESTERone (PROVERA) 10 MG tablet    Sig: Take 1 tablet (10 mg total) by mouth daily.    Dispense:  10 tablet    Refill:  0    Order Specific Question:   Supervising Provider    Answer:   Gae Dry [[id number]]      Return if symptoms worsen or fail to improve.  Brenda Minogue B. Devaeh Amadi, PA-C 11/06/2019 3:03 PM

## 2019-11-06 ENCOUNTER — Encounter: Payer: Self-pay | Admitting: Obstetrics and Gynecology

## 2019-11-06 ENCOUNTER — Encounter: Payer: Self-pay | Admitting: Student in an Organized Health Care Education/Training Program

## 2019-11-06 ENCOUNTER — Other Ambulatory Visit (INDEPENDENT_AMBULATORY_CARE_PROVIDER_SITE_OTHER): Payer: Medicaid Other

## 2019-11-06 ENCOUNTER — Other Ambulatory Visit (HOSPITAL_COMMUNITY)
Admission: RE | Admit: 2019-11-06 | Discharge: 2019-11-06 | Disposition: A | Discharge status: Home / Self Care | Payer: Medicaid Other | Source: Ambulatory Visit | Attending: Obstetrics and Gynecology | Admitting: Obstetrics and Gynecology

## 2019-11-06 ENCOUNTER — Ambulatory Visit (INDEPENDENT_AMBULATORY_CARE_PROVIDER_SITE_OTHER): Payer: Medicaid Other | Admitting: Obstetrics and Gynecology

## 2019-11-06 ENCOUNTER — Other Ambulatory Visit: Payer: Self-pay

## 2019-11-06 ENCOUNTER — Other Ambulatory Visit: Payer: Self-pay | Admitting: Obstetrics and Gynecology

## 2019-11-06 VITALS — Ht 66.0 in | Wt 349.0 lb

## 2019-11-06 DIAGNOSIS — E1142 Type 2 diabetes mellitus with diabetic polyneuropathy: Secondary | ICD-10-CM

## 2019-11-06 DIAGNOSIS — Z30431 Encounter for routine checking of intrauterine contraceptive device: Secondary | ICD-10-CM | POA: Diagnosis not present

## 2019-11-06 DIAGNOSIS — N939 Abnormal uterine and vaginal bleeding, unspecified: Secondary | ICD-10-CM | POA: Diagnosis present

## 2019-11-06 DIAGNOSIS — D251 Intramural leiomyoma of uterus: Secondary | ICD-10-CM | POA: Diagnosis not present

## 2019-11-06 DIAGNOSIS — R102 Pelvic and perineal pain: Secondary | ICD-10-CM | POA: Diagnosis not present

## 2019-11-06 DIAGNOSIS — M47816 Spondylosis without myelopathy or radiculopathy, lumbar region: Secondary | ICD-10-CM

## 2019-11-06 DIAGNOSIS — R9389 Abnormal findings on diagnostic imaging of other specified body structures: Secondary | ICD-10-CM

## 2019-11-06 DIAGNOSIS — T8332XA Displacement of intrauterine contraceptive device, initial encounter: Secondary | ICD-10-CM

## 2019-11-06 LAB — POCT HEMOGLOBIN: Hemoglobin: 11.7 g/dL (ref 11–14.6)

## 2019-11-06 MED ORDER — MEDROXYPROGESTERONE ACETATE 10 MG PO TABS: 10.0000 mg | ORAL_TABLET | Freq: Every day | ORAL | 0 refills | Status: DC

## 2019-11-06 NOTE — Patient Instructions (Signed)
I value your feedback and entrusting us with your care. If you get a [facility] patient survey, I would appreciate you taking the time to let us know about your experience today. Thank you!  As of November 01, 2019, your lab results will be released to your MyChart immediately, before I even have a chance to see them. Please give me time to review them and contact you if there are any abnormalities. Thank you for your patience.  

## 2019-11-07 ENCOUNTER — Telehealth: Payer: Self-pay | Admitting: Obstetrics and Gynecology

## 2019-11-07 DIAGNOSIS — N939 Abnormal uterine and vaginal bleeding, unspecified: Secondary | ICD-10-CM

## 2019-11-07 LAB — SURGICAL PATHOLOGY

## 2019-11-07 NOTE — Telephone Encounter (Signed)
Pt aware of neg EMB results. Started provera and flow is getting lighter after 1 dose. Cont for now. Discussed depo, ablation, IUD, hyst. Pt wants hyst. Will refer to [facility] since she is high risk with wt/BMI/DM and inability to do surgery at [facility] currently due to covid.

## 2019-11-08 ENCOUNTER — Encounter: Payer: Self-pay | Admitting: Obstetrics and Gynecology

## 2019-11-09 ENCOUNTER — Telehealth: Payer: Self-pay | Admitting: Obstetrics and Gynecology

## 2019-11-09 NOTE — Telephone Encounter (Signed)
The practice the patient was referred to does not accept [place] [facility], will need new referral sent.

## 2019-11-15 ENCOUNTER — Encounter: Payer: Self-pay | Admitting: Family Medicine

## 2019-11-19 ENCOUNTER — Ambulatory Visit: Payer: Medicaid Other | Admitting: Family Medicine

## 2019-11-19 ENCOUNTER — Encounter: Payer: Self-pay | Admitting: Obstetrics and Gynecology

## 2019-11-19 NOTE — Telephone Encounter (Signed)
Confirming [facility] ref sent for DUB/hyst. Thx

## 2019-11-20 ENCOUNTER — Encounter: Payer: Self-pay | Admitting: Family Medicine

## 2019-11-20 ENCOUNTER — Other Ambulatory Visit: Payer: Self-pay | Admitting: Obstetrics and Gynecology

## 2019-11-20 DIAGNOSIS — N939 Abnormal uterine and vaginal bleeding, unspecified: Secondary | ICD-10-CM

## 2019-11-20 NOTE — Telephone Encounter (Signed)
What about [facility]? She is high risk so don't just want someone in [place].

## 2019-11-22 ENCOUNTER — Ambulatory Visit: Payer: Medicaid Other | Admitting: Family Medicine

## 2019-11-27 ENCOUNTER — Encounter: Payer: Self-pay | Admitting: Student in an Organized Health Care Education/Training Program

## 2019-11-29 ENCOUNTER — Other Ambulatory Visit: Payer: Self-pay

## 2019-11-29 ENCOUNTER — Ambulatory Visit
Payer: Medicaid Other | Attending: Student in an Organized Health Care Education/Training Program | Admitting: Student in an Organized Health Care Education/Training Program

## 2019-11-29 ENCOUNTER — Encounter: Payer: Self-pay | Admitting: Student in an Organized Health Care Education/Training Program

## 2019-11-29 ENCOUNTER — Telehealth: Payer: Self-pay | Admitting: *Deleted

## 2019-11-29 DIAGNOSIS — Z981 Arthrodesis status: Secondary | ICD-10-CM

## 2019-11-29 DIAGNOSIS — M47816 Spondylosis without myelopathy or radiculopathy, lumbar region: Secondary | ICD-10-CM | POA: Diagnosis not present

## 2019-11-29 DIAGNOSIS — M5136 Other intervertebral disc degeneration, lumbar region: Secondary | ICD-10-CM

## 2019-11-29 DIAGNOSIS — E1142 Type 2 diabetes mellitus with diabetic polyneuropathy: Secondary | ICD-10-CM | POA: Diagnosis not present

## 2019-11-29 DIAGNOSIS — M51369 Other intervertebral disc degeneration, lumbar region without mention of lumbar back pain or lower extremity pain: Secondary | ICD-10-CM

## 2019-11-29 MED ORDER — ACETAMINOPHEN 500 MG PO TABS: 1000.0000 mg | ORAL_TABLET | Freq: Two times a day (BID) | ORAL | 2 refills | Status: DC | PRN

## 2019-11-29 MED ORDER — PREGABALIN 75 MG PO CAPS: 75.0000 mg | ORAL_CAPSULE | Freq: Two times a day (BID) | ORAL | 1 refills | Status: DC

## 2019-11-29 NOTE — Progress Notes (Signed)
Patient's Name: Brenda Hicks  MRN: [id number]  Referring Provider: Hubbard Hartshorn, FNP  DOB: 10-11-1966  PCP: Hubbard Hartshorn, FNP  DOS: 11/29/2019  Note by: Gillis Santa, MD  Service setting: Virtual Visit (Telephone)  Attending: Gillis Santa, MD  Location: Telephone Encounter  Specialty: Interventional Pain Management  Patient type: Established   Virtual Encounter - Pain Management PROVIDER NOTE: Information contained herein reflects review and annotations entered in association with encounter. Interpretation of such information and data should be left to medically-trained personnel. Information provided to patient can be located elsewhere in the medical record under "Patient Instructions". Document created using STT-dictation technology, any transcriptional errors that may result from process are unintentional.    Contact & Pharmacy Preferred: [phone] Home: [phone] (home) Mobile: [phone] (mobile) E-mail: [email or web address] [id number], [address] [place] [address] Phone: [phone] Fax: [phone]   Pre-screening  [name] offered "in-person" vs "virtual" encounter. She indicated preferring virtual for this encounter.   Reason COVID-19*  Social distancing based on CDC and AMA recommendations.   I contacted Brenda Hicks on 11/29/2019 via telephone.      I clearly identified myself as Gillis Santa, MD. I verified that I was speaking with the correct person using two identifiers (Name: [name], and date of birth: [birth year]).  This visit was completed via telephone due to the restrictions of the COVID-19 pandemic. All issues as above were discussed and addressed but no physical exam was performed. If it was felt that the patient should be evaluated in the office, they were directed there. The patient verbally consented to this visit. Patient was unable to  complete an audio/visual visit due to Technical difficulties and/or Lack of internet. Due to the catastrophic nature of the COVID-19 pandemic, this visit was done through audio contact only.  Location of the patient: home address (see Epic for details)  Location of the provider: office  Consent I sought verbal advanced consent from Brenda Hicks for virtual visit interactions. I informed Ms. Barski of possible security and privacy concerns, risks, and limitations associated with providing "not-in-person" medical evaluation and management services. I also informed Ms. Sather of the availability of "in-person" appointments. Finally, I informed her that there would be a charge for the virtual visit and that she could be  personally, fully or partially, financially responsible for it. Ms. Lupa expressed understanding and agreed to proceed.   Historic Elements   Brenda Hicks is a 54 y.o. year old, female patient evaluated today after her last encounter by our practice on 11/01/2019. [name]  has a past medical history of Anemia, Arthritis, Asthma, Cancer of ear, Diabetes mellitus without complication ([place]), Fatty liver, Hypertension, Kidney cysts, Renal disorder, Sleep apnea, and Stroke [facility]) (May or June 2019). She also  has a past surgical history that includes Knee surgery (Right, 2014); Dilation and curettage of uterus; Endometrial biopsy; Exploratory laparotomy (1992); Hand surgery (Right, 1998); Tubal ligation; Knee arthroscopy (Right, 2012); Shoulder arthroscopy with open rotator cuff repair (Left, 12/14/2016); Shoulder arthroscopy with subacromial decompression (Left, 12/14/2016); Shoulder arthroscopy with bicepstenotomy (Left, 12/14/2016); Anterior cervical decomp/discectomy fusion (N/A, 08/09/2018); Joint replacement (Right, 2014); Hernia repair (1975); Back surgery; Shoulder arthroscopy with subacromial decompression and bicep tendon repair (Right, 01/04/2019); and Shoulder arthroscopy with  rotator cuff repair (Right, 01/04/2019). [name] has a current medication list which includes the  following prescription(s): accu-chek fastclix lancets, accu-chek guide, albuterol, atorvastatin, accu-chek guide, estradiol, exenatide er, insulin glargine, insulin lispro, insulin syringe 1cc/31gx5/16", medroxyprogesterone, naproxen sodium, promethazine, quetiapine, tizanidine, trazodone, vitamin d (ergocalciferol), acetaminophen, norethindrone, pregabalin, and venlafaxine. She  reports that she quit smoking about 2 years ago. Her smoking use included cigarettes. She has a 50.00 pack-year smoking history. She has never used smokeless tobacco. She reports that she does not drink alcohol or use drugs. [name] is allergic to morphine and related and tramadol.   HPI  Today, she is being contacted for a post-procedure assessment.  Evaluation of last interventional procedure  10/31/2019 Procedure: L4, L5, S1 Bilateral Facets  Sedation: Please see nurses note for DOS. When no sedatives are used, the analgesic levels obtained are directly associated to the effectiveness of the local anesthetics. However, when sedation is provided, the level of analgesia obtained during the initial 1 hour following the intervention, is believed to be the result of a combination of factors. These factors may include, but are not limited to: 1. The effectiveness of the local anesthetics used. 2. The effects of the analgesic(s) and/or anxiolytic(s) used. 3. The degree of discomfort experienced by the patient at the time of the procedure. 4. The patients ability and reliability in recalling and recording the events. 5. The presence and influence of possible secondary gains and/or psychosocial factors. Reported result: Relief experienced during the 1st hour after the procedure: 100 % (Ultra-Short Term Relief)            Interpretative annotation: Clinically appropriate result. Analgesia during this period is likely to be Local  Anesthetic and/or IV Sedative (Analgesic/Anxiolytic) related.          Effects of local anesthetic: The analgesic effects attained during this period are directly associated to the localized infiltration of local anesthetics and therefore cary significant diagnostic value as to the etiological location, or anatomical origin, of the pain. Expected duration of relief is directly dependent on the pharmacodynamics of the local anesthetic used. Long-acting (4-6 hours) anesthetics used.  Reported result: Relief during the next 4 to 6 hour after the procedure: 100 % (Short-Term Relief)            Interpretative annotation: Clinically appropriate result. Analgesia during this period is likely to be Local Anesthetic-related.          Long-term benefit: Defined as the period of time past the expected duration of local anesthetics (1 hour for short-acting and 4-6 hours for long-acting). With the possible exception of prolonged sympathetic blockade from the local anesthetics, benefits during this period are typically attributed to, or associated with, other factors such as analgesic sensory neuropraxia, antiinflammatory effects, or beneficial biochemical changes provided by agents other than the local anesthetics.  Reported result: Extended relief following procedure: 0 %(Pt stated that she only had relief for a day) (Long-Term Relief)            Interpretative annotation: No benefit  DO NOT recommend repeating or RFA  Laboratory Chemistry Profile (12 mo)  Renal: 08/07/2019: BUN/Creatinine Ratio NOT APPLICABLE 123XX123: BUN 13; Creatinine, Ser 1.02  Lab Results  Component Value Date   GFRAA >60 08/14/2019   GFRNONAA >60 08/14/2019   Hepatic: 08/14/2019: Albumin 3.8 Lab Results  Component Value Date   AST 30 08/14/2019   ALT 32 08/14/2019   Other: No results found for requested labs within last 8760 hours. Note: Above Lab results reviewed.  Imaging  US PELVIS TRANSVAGINAL NON-OB (TV ONLY) Patient  Name:  TANGELIA VOGAN DOB: May 08, 1966 MRN: [id number] ULTRASOUND REPORT  Location: [facility]  Date of Service: 11/06/2019   Indications:Abnormal Uterine Bleeding Findings:  The uterus is anteverted and measures 10.8 x 6.7 x 5.9cm. Echo texture is heterogenous with evidence of focal mass. Within the uterus there is one suspected fibroid measuring: Fibroid 1:  3.8 x 2.7 x 2.6cm (IM, LT)  The Endometrium is thickened and heterogeneous measuring 13.8 mm. IUD is  not seen.   Bilateral ovaries are not seen due to body habitus. Survey of the adnexa demonstrates no adnexal masses. There is no free fluid in the cul de sac.  Impression: 1. Intramural fibroid again seen 3.8cm. 2. Thickened and heterogeneous endometrial canal. IUD is not seen.   Recommendations: 1.Clinical correlation with the patient's History and Physical Exam. 2. Correlate endometrial thickness with menstrual history. If  postmenopausal biopsy would be recommended.  3. IUD not seen, abdominal and pelvic x-ray recommended .    Vita Barley, RT  I have reviewed this ultrasound and the report. I agree with the above  assessment and plan.  [facility] 11/06/19 11:03 AM     Assessment  The primary encounter diagnosis was Lumbar facet joint syndrome. Diagnoses of Diabetic polyneuropathy associated with type 2 diabetes mellitus ([place]), Lumbar facet arthropathy, Lumbar degenerative disc disease, Morbid obesity ([place]), and S/P cervical spinal fusion were also pertinent to this visit.  Plan of Care  I have discontinued Jessey J. Mossberg's pregabalin. I am also having her start on pregabalin and acetaminophen. Additionally, I am having her maintain her traZODone, Exenatide ER, promethazine, QUEtiapine, Vitamin D (Ergocalciferol), insulin lispro, naproxen sodium, INSULIN SYRINGE 1CC/31GX5/16", Accu-Chek Guide, Accu-Chek FastClix Lancets, Accu-Chek Guide, venlafaxine,  insulin glargine, atorvastatin, estradiol, norethindrone, tiZANidine, albuterol, and medroxyPROGESTERone.  Patient follows up today for virtual evaluation status post diagnostic lumbar facet medial branch nerve blocks at L4-L5 and S1 bilaterally.  Unfortunately this did not provide the patient with any pain relief.  She did not meet diagnostic criteria which is at least 50% pain relief for 5 days to trial a second diagnostic injection.  I can provide some medication recommendations for the patient.  Do not recommend opioid analgesics in the context of her morbid obesity and her disease process which is largely arthritic in nature as a result of her weight.  I stressed the importance of weight loss, exercise and dieting.  I am managing the patient's Lyrica which is at 50 mg twice daily.  She states that she is not noticing any benefit from this medication.  Recommend increasing to 75 mg twice daily to see if that has better analgesic benefit.  Also recommend patient utilize acetaminophen 500 mg 4 times daily as needed or 1000 mg BID prn. Patient endorsed understanding.   Requested Prescriptions   Signed Prescriptions Disp Refills  . pregabalin (LYRICA) 75 MG capsule 60 capsule 1    Sig: Take 1 capsule (75 mg total) by mouth 2 (two) times daily.  Marland Kitchen acetaminophen (TYLENOL) 500 MG tablet 100 tablet 2    Sig: Take 2 tablets (1,000 mg total) by mouth 2 (two) times daily as needed for moderate pain.    Follow-up plan:   Return in about 8 weeks (around 01/24/2020) for Medication Management, virtual.     10/31/2019 for b/l L4,5, S1 facets- not effective, do not repeat     Recent Visits Date Type Provider Dept  10/31/19 Procedure visit Gillis Santa, MD [facility]  10/15/19 Office Visit Gillis Santa, MD [facility]  09/24/19 Office Visit Gillis Santa, MD [facility]  Showing recent visits within past 90 days and meeting all other requirements   Today's Visits Date Type  Provider Dept  11/29/19 Office Visit Gillis Santa, MD [facility]  Showing today's visits and meeting all other requirements   Future Appointments No visits were found meeting these conditions.  Showing future appointments within next 90 days and meeting all other requirements   I discussed the assessment and treatment plan with the patient. The patient was provided an opportunity to ask questions and all were answered. The patient agreed with the plan and demonstrated an understanding of the instructions.  Patient advised to call back or seek an in-person evaluation if the symptoms or condition worsens.  Total duration of non-face-to-face encounter: 30 minutes.  Note by: Gillis Santa, MD Date: 11/29/2019; Time: 2:01 PM

## 2019-11-30 ENCOUNTER — Ambulatory Visit: Payer: Medicaid Other | Admitting: Family Medicine

## 2019-12-10 ENCOUNTER — Encounter: Payer: Self-pay | Admitting: Student in an Organized Health Care Education/Training Program

## 2019-12-10 ENCOUNTER — Ambulatory Visit: Payer: Medicaid Other | Admitting: Student in an Organized Health Care Education/Training Program

## 2020-01-02 ENCOUNTER — Institutional Professional Consult (permissible substitution): Payer: Medicaid Other | Admitting: Pulmonary Disease
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
## 2020-01-23 ENCOUNTER — Encounter: Payer: Self-pay | Admitting: Family Medicine

## 2020-01-24 ENCOUNTER — Encounter: Payer: Self-pay | Admitting: Student in an Organized Health Care Education/Training Program

## 2020-01-24 ENCOUNTER — Ambulatory Visit
Payer: Medicaid Other | Attending: Student in an Organized Health Care Education/Training Program | Admitting: Student in an Organized Health Care Education/Training Program

## 2020-01-24 ENCOUNTER — Other Ambulatory Visit: Payer: Self-pay

## 2020-01-24 DIAGNOSIS — M47816 Spondylosis without myelopathy or radiculopathy, lumbar region: Secondary | ICD-10-CM

## 2020-01-24 NOTE — Progress Notes (Signed)
I attempted to call the patient however no response.  -Dr Kelcey Korus  

## 2020-01-29 ENCOUNTER — Ambulatory Visit: Payer: Medicaid Other | Admitting: Internal Medicine

## 2020-01-30 ENCOUNTER — Ambulatory Visit (INDEPENDENT_AMBULATORY_CARE_PROVIDER_SITE_OTHER): Payer: Medicaid Other | Admitting: Certified Nurse Midwife

## 2020-01-30 ENCOUNTER — Other Ambulatory Visit: Payer: Self-pay

## 2020-01-30 ENCOUNTER — Encounter: Payer: Self-pay | Admitting: Certified Nurse Midwife

## 2020-01-30 VITALS — HR 82 | Ht 66.0 in | Wt 359.1 lb

## 2020-01-30 DIAGNOSIS — Z23 Encounter for immunization: Secondary | ICD-10-CM | POA: Diagnosis not present

## 2020-01-30 DIAGNOSIS — Z01419 Encounter for gynecological examination (general) (routine) without abnormal findings: Secondary | ICD-10-CM

## 2020-01-30 DIAGNOSIS — Z1231 Encounter for screening mammogram for malignant neoplasm of breast: Secondary | ICD-10-CM

## 2020-01-30 DIAGNOSIS — Z1211 Encounter for screening for malignant neoplasm of colon: Secondary | ICD-10-CM

## 2020-01-30 MED ORDER — TETANUS-DIPHTH-ACELL PERTUSSIS 5-2.5-18.5 LF-MCG/0.5 IM SUSP
0.5000 mL | Freq: Once | INTRAMUSCULAR | Status: AC
  Administered 2020-01-30: 0.5 mL via INTRAMUSCULAR

## 2020-01-30 NOTE — Progress Notes (Signed)
GYNECOLOGY ANNUAL PREVENTATIVE CARE ENCOUNTER NOTE  History:     Brenda Hicks is a 54 y.o. [phone] female here for a routine annual gynecologic exam.  Current complaints: none.   Denies abnormal vaginal bleeding, discharge, pelvic pain, problems with intercourse or other gynecologic concerns. States that the bleeding stopped.    Gynecologic History No LMP recorded. Contraception: none, IUD fell out with heavy bleeding  Last Pap: 01/29/19. Results were: normal with negative HPV Last mammogram: not sure .orders placed  Obstetric History OB History  Gravida Para Term Preterm AB Living  '7 6 5 1 1 6  ' SAB TAB Ectopic Multiple Live Births      1   6    # Outcome Date GA Lbr Len/2nd Weight Sex Delivery Anes PTL Lv  7 Ectopic 1991          6 Term 11/11/89   8 lb 4 oz (3.742 kg) M Vag-Spont  N LIV  5 Term 03/10/88   7 lb 2 oz (3.232 kg) M Vag-Spont  N LIV  4 Term 02/15/87   6 lb (2.722 kg) M Vag-Spont  N LIV  3 Term 01/02/86   5 lb 10 oz (2.551 kg)  Vag-Spont  N LIV     Complications: Dysfunctional Labor  2 Preterm 12/31/84   4 lb 11 oz (2.126 kg) M Vag-Spont  Y LIV  1 Term 03/05/84   6 lb 7 oz (2.92 kg) M Vag-Spont  N LIV    Past Medical History:  Diagnosis Date  . Anemia    vitamin d deficiency  . Arthritis   . Asthma    WELL CONTROLLED  . 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 Diabetes mellitus without complication ([place])   . Fatty liver   . Hypertension   . Kidney cysts    per patient, never had  . Renal disorder   . Sleep apnea    USES CPAP. waiting for new machine and a new sleep study  . Stroke [facility]) May or June 2019   TIA. no residual symptoms    Past Surgical History:  Procedure Laterality Date  . ANTERIOR CERVICAL DECOMP/DISCECTOMY FUSION N/A 08/09/2018   Procedure: ANTERIOR CERVICAL DECOMPRESSION/DISCECTOMY FUSION 1 LEVEL- C4-5;  Surgeon: Meade Maw, MD;  Location: [facility];  Service: Neurosurgery;  Laterality: N/A;  . BACK SURGERY    .  DILATION AND CURETTAGE OF UTERUS    . ENDOMETRIAL BIOPSY     benign  . EXPLORATORY LAPAROTOMY  1992   REMOVAL OF RUPTURED ECTOPIC  . HAND SURGERY Right 1998   cyst removed  . HERNIA REPAIR  6440   UMBILICAL  . JOINT REPLACEMENT Right 2014   TKR  . KNEE ARTHROSCOPY Right 2012  . KNEE SURGERY Right 2014   total knee replacement  . SHOULDER ARTHROSCOPY WITH BICEPSTENOTOMY Left 12/14/2016   Procedure: SHOULDER ARTHROSCOPY WITH BICEPSTENOTOMY;  Surgeon: Corky Mull, MD;  Location: [facility];  Service: Orthopedics;  Laterality: Left;  . SHOULDER ARTHROSCOPY WITH OPEN ROTATOR CUFF REPAIR Left 12/14/2016   Procedure: SHOULDER ARTHROSCOPY WITH OPEN ROTATOR CUFF REPAIR AND ARTHROSCOPIC ROTATOR CUFF REPAIR;  Surgeon: Corky Mull, MD;  Location: [facility];  Service: Orthopedics;  Laterality: Left;  . SHOULDER ARTHROSCOPY WITH ROTATOR CUFF REPAIR Right 01/04/2019   Procedure: SHOULDER ARTHROSCOPY WITH ROTATOR CUFF REPAIR;  Surgeon: Corky Mull, MD;  Location: [facility];  Service: Orthopedics;  Laterality: Right;  . SHOULDER  ARTHROSCOPY WITH SUBACROMIAL DECOMPRESSION Left 12/14/2016   Procedure: SHOULDER ARTHROSCOPY WITH SUBACROMIAL DECOMPRESSION;  Surgeon: Corky Mull, MD;  Location: [facility];  Service: Orthopedics;  Laterality: Left;  . SHOULDER ARTHROSCOPY WITH SUBACROMIAL DECOMPRESSION AND BICEP TENDON REPAIR Right 01/04/2019   Procedure: SHOULDER ARTHROSCOPY WITH DEBRIDEMENT AND SUBACROMIAL DECOMPRESSION-RIGHT;  Surgeon: Corky Mull, MD;  Location: [facility];  Service: Orthopedics;  Laterality: Right;  . TUBAL LIGATION      Current Outpatient Medications on File Prior to Visit  Medication Sig Dispense Refill  . Accu-Chek FastClix Lancets MISC TEST TID    . ACCU-CHEK GUIDE test strip U TID UTD    . acetaminophen (TYLENOL) 500 MG tablet Take 2 tablets (1,000 mg total) by mouth 2 (two) times daily as needed for moderate pain. 100 tablet 2  . albuterol (VENTOLIN HFA) 108 (90 Base) MCG/ACT inhaler  Inhale 2 puffs into the lungs every 6 (six) hours as needed for wheezing or shortness of breath. 18 g 3  . atorvastatin (LIPITOR) 20 MG tablet Take 1 tablet (20 mg total) by mouth daily. 90 tablet 3  . Blood Glucose Monitoring Suppl (ACCU-CHEK GUIDE) w/Device KIT U UTD TID FOR FINGERSTICK TESTING    . estradiol (ESTRACE) 1 MG tablet Take 1 tablet (1 mg total) by mouth daily. 21 tablet 0  . Exenatide ER (BYDUREON) 2 MG PEN Inject 2 mg into the skin every Sunday.     . insulin glargine (LANTUS) 100 UNIT/ML injection Inject 1 mL (100 Units total) into the skin daily. 10 mL 11  . insulin lispro (HUMALOG) 100 UNIT/ML injection Inject 30 Units into the skin 3 (three) times daily before meals.    . Insulin Syringe-Needle U-100 (INSULIN SYRINGE 1CC/31GX5/16") 31G X 5/16" 1 ML MISC USE 1 QID    . medroxyPROGESTERone (PROVERA) 10 MG tablet Take 1 tablet (10 mg total) by mouth daily. 10 tablet 0  . naproxen sodium (ALEVE) 220 MG tablet Take 220 mg by mouth daily as needed.    . norethindrone (AYGESTIN) 5 MG tablet Take 1 tablet (5 mg total) by mouth 2 (two) times daily for 21 days. As directed (Patient not taking: Reported on 09/24/2019) 42 tablet 0  . pregabalin (LYRICA) 75 MG capsule Take 1 capsule (75 mg total) by mouth 2 (two) times daily. 60 capsule 1  . promethazine (PHENERGAN) 25 MG tablet Take 1 tablet (25 mg total) by mouth every 6 (six) hours as needed for nausea or vomiting. 20 tablet 0  . QUEtiapine (SEROQUEL) 50 MG tablet Take 50 mg by mouth at bedtime.    Marland Kitchen tiZANidine (ZANAFLEX) 4 MG tablet Take 1.5 tablets (6 mg total) by mouth 2 (two) times daily as needed for muscle spasms. 60 tablet 2  . traZODone (DESYREL) 50 MG tablet Take 50 mg by mouth at bedtime as needed for sleep.   5  . venlafaxine (EFFEXOR) 37.5 MG tablet Take by mouth.    . Vitamin D, Ergocalciferol, (DRISDOL) 1.25 MG (50000 UT) CAPS capsule Take 50,000 Units by mouth every Monday.     No current facility-administered medications  on file prior to visit.    Allergies  Allergen Reactions  . Morphine And Related Nausea And Vomiting  . Tramadol     Nausea. If she takes antinausea medicine with this medicine, then she can tolerate it.    Social History:  reports that she quit smoking about 2 years ago. Her smoking use included cigarettes. She has a 50.00 pack-year smoking history.  She has never used smokeless tobacco. She reports that she does not drink alcohol or use drugs.  Family History  Problem Relation Age of Onset  . Hypertension Mother   . CAD Maternal Grandmother   . Breast cancer Neg Hx   . Ovarian cancer Neg Hx   . Colon cancer Neg Hx     The following portions of the patient's history were reviewed and updated as appropriate: allergies, current medications, past family history, past medical history, past social history, past surgical history and problem list.  Review of Systems Pertinent items noted in HPI and remainder of comprehensive ROS otherwise negative.  Physical Exam:  Pulse 82   Ht '5\' 6"'  (1.676 m)   Wt (!) 359 lb 1 oz (162.9 kg)   BMI 57.95 kg/m  CONSTITUTIONAL: Well-developed, well-nourished, morbidly obese female in no acute distress.  HENT:  Normocephalic, atraumatic, External right and left ear normal. Oropharynx is clear and moist EYES: Conjunctivae and EOM are normal. Pupils are equal, round, and reactive to light. No scleral icterus.  NECK: Normal range of motion, supple, no masses.  Normal thyroid.  SKIN: Skin is warm and dry. No rash noted. Not diaphoretic. No erythema. No pallor. MUSCULOSKELETAL: Normal range of motion. No tenderness.  No cyanosis, clubbing, or edema.  2+ distal pulses. NEUROLOGIC: Alert and oriented to person, place, and time. Normal reflexes, muscle tone coordination.  PSYCHIATRIC: Normal mood and affect. Normal behavior. Normal judgment and thought content. CARDIOVASCULAR: Normal heart rate noted, regular rhythm RESPIRATORY: Clear to auscultation  bilaterally. Effort and breath sounds normal, no problems with respiration noted. BREASTS: Symmetric in size. No masses, tenderness, skin changes, nipple drainage, or lymphadenopathy bilaterally.pendulous, exam compromised due to body habitus  ABDOMEN: Soft, no distention noted.  No tenderness, rebound or guarding.  PELVIC: Normal appearing external genitalia and urethral meatus; normal appearing vaginal mucosa and cervix.  No abnormal discharge noted.  Pap not indicated.   Normal uterine size, no other palpable masses, no uterine or adnexal tenderness.exam compromised due to body habitus   Assessment and Plan:    1. Women's annual routine gynecological examination  Pap not due until 2025 Mammogram ordered Colonoscopy ordered Lab none due Tdap vaccine today Refills: none Routine preventative health maintenance measures emphasized.Encouraged wt loss with diet and exercise. Please refer to After Visit Summary for other counseling recommendations.      Philip Aspen, CNM

## 2020-01-30 NOTE — Addendum Note (Signed)
Addended by: Raliegh Ip on: 01/30/2020 11:21 AM   Modules accepted: Orders

## 2020-01-30 NOTE — Patient Instructions (Signed)

## 2020-02-08 ENCOUNTER — Other Ambulatory Visit (HOSPITAL_COMMUNITY): Payer: Self-pay | Admitting: Surgery

## 2020-02-08 ENCOUNTER — Ambulatory Visit: Payer: Medicaid Other

## 2020-02-08 ENCOUNTER — Other Ambulatory Visit: Payer: Self-pay | Admitting: Surgery

## 2020-02-08 DIAGNOSIS — M7989 Other specified soft tissue disorders: Secondary | ICD-10-CM | POA: Insufficient documentation

## 2020-02-11 ENCOUNTER — Ambulatory Visit
Admission: RE | Admit: 2020-02-11 | Discharge: 2020-02-11 | Disposition: A | Discharge status: Home / Self Care | Payer: Medicaid Other | Source: Ambulatory Visit | Attending: Surgery | Admitting: Surgery

## 2020-02-11 ENCOUNTER — Other Ambulatory Visit: Payer: Self-pay

## 2020-02-11 DIAGNOSIS — M7989 Other specified soft tissue disorders: Secondary | ICD-10-CM | POA: Insufficient documentation

## 2020-02-25 ENCOUNTER — Other Ambulatory Visit (INDEPENDENT_AMBULATORY_CARE_PROVIDER_SITE_OTHER): Payer: Self-pay | Admitting: Nurse Practitioner

## 2020-02-25 DIAGNOSIS — M79604 Pain in right leg: Secondary | ICD-10-CM

## 2020-02-28 ENCOUNTER — Encounter (INDEPENDENT_AMBULATORY_CARE_PROVIDER_SITE_OTHER): Payer: Medicaid Other

## 2020-02-28 ENCOUNTER — Ambulatory Visit (INDEPENDENT_AMBULATORY_CARE_PROVIDER_SITE_OTHER): Payer: Medicaid Other | Admitting: Nurse Practitioner

## 2020-03-02 ENCOUNTER — Emergency Department
Admission: EM | Admit: 2020-03-02 | Discharge: 2020-03-02 | Disposition: A | Discharge status: Home / Self Care | Payer: Medicaid Other | Attending: Emergency Medicine | Admitting: Emergency Medicine

## 2020-03-02 ENCOUNTER — Emergency Department: Payer: Medicaid Other

## 2020-03-02 ENCOUNTER — Other Ambulatory Visit: Payer: Self-pay

## 2020-03-02 DIAGNOSIS — Z96651 Presence of right artificial knee joint: Secondary | ICD-10-CM | POA: Insufficient documentation

## 2020-03-02 DIAGNOSIS — W108XXA Fall (on) (from) other stairs and steps, initial encounter: Secondary | ICD-10-CM | POA: Insufficient documentation

## 2020-03-02 DIAGNOSIS — M79604 Pain in right leg: Secondary | ICD-10-CM | POA: Insufficient documentation

## 2020-03-02 DIAGNOSIS — E119 Type 2 diabetes mellitus without complications: Secondary | ICD-10-CM | POA: Diagnosis not present

## 2020-03-02 DIAGNOSIS — I10 Essential (primary) hypertension: Secondary | ICD-10-CM | POA: Insufficient documentation

## 2020-03-02 DIAGNOSIS — Z87891 Personal history of nicotine dependence: Secondary | ICD-10-CM | POA: Diagnosis not present

## 2020-03-02 DIAGNOSIS — M25561 Pain in right knee: Secondary | ICD-10-CM | POA: Insufficient documentation

## 2020-03-02 DIAGNOSIS — Z85828 Personal history of other malignant neoplasm of skin: Secondary | ICD-10-CM | POA: Insufficient documentation

## 2020-03-02 DIAGNOSIS — W19XXXA Unspecified fall, initial encounter: Secondary | ICD-10-CM

## 2020-03-02 DIAGNOSIS — Z794 Long term (current) use of insulin: Secondary | ICD-10-CM | POA: Diagnosis not present

## 2020-03-02 DIAGNOSIS — Z8673 Personal history of transient ischemic attack (TIA), and cerebral infarction without residual deficits: Secondary | ICD-10-CM | POA: Insufficient documentation

## 2020-03-02 DIAGNOSIS — Z79899 Other long term (current) drug therapy: Secondary | ICD-10-CM | POA: Insufficient documentation

## 2020-03-02 DIAGNOSIS — J45909 Unspecified asthma, uncomplicated: Secondary | ICD-10-CM | POA: Diagnosis not present

## 2020-03-02 DIAGNOSIS — Y92009 Unspecified place in unspecified non-institutional (private) residence as the place of occurrence of the external cause: Secondary | ICD-10-CM

## 2020-03-02 MED ORDER — KETOROLAC TROMETHAMINE 30 MG/ML IJ SOLN
30.0000 mg | Freq: Once | INTRAMUSCULAR | Status: AC
  Administered 2020-03-02: 30 mg via INTRAMUSCULAR
  Filled 2020-03-02: qty 1

## 2020-03-02 MED ORDER — CYCLOBENZAPRINE HCL 5 MG PO TABS: 5.0000 mg | ORAL_TABLET | Freq: Three times a day (TID) | ORAL | 0 refills | Status: DC | PRN

## 2020-03-02 MED ORDER — OXYCODONE-ACETAMINOPHEN 5-325 MG PO TABS
1.0000 | ORAL_TABLET | Freq: Once | ORAL | Status: AC
  Administered 2020-03-02: 17:00:00 1 via ORAL
  Filled 2020-03-02: qty 1

## 2020-03-02 MED ORDER — OXYCODONE-ACETAMINOPHEN 5-325 MG PO TABS: 1.0000 | ORAL_TABLET | Freq: Four times a day (QID) | ORAL | 0 refills | Status: AC | PRN

## 2020-03-02 MED ORDER — KETOROLAC TROMETHAMINE 10 MG PO TABS: 10.0000 mg | ORAL_TABLET | Freq: Three times a day (TID) | ORAL | 0 refills | Status: DC

## 2020-03-02 NOTE — ED Notes (Signed)
See triage note, pt reports she fell down a couple cement steps this morning when her legs gave out.  Pt reports pain with whole right leg with weight bearing.  Pt to treatment room in wheelchair, typically uses walker at home.  No bruising noted to right leg

## 2020-03-02 NOTE — ED Provider Notes (Signed)
[facility] Emergency Department Provider Note ____________________________________________  Time seen: 6144  I have reviewed the triage vital signs and the nursing notes.  HISTORY  Chief Complaint  Fall  HPI Brenda Hicks is a 54 y.o. female presents to the ED accompanied by family, for evaluation of injury sustained following mechanical fall.  Patient was leaving the house this morning for church, when she apparently stepped down off of her porch, and her right knee gave way.  This caused patient to fall on her next right leg.  She landed on her buttocks, and had pain immediately to the right leg from the hip to the foot.  Patient's history is consistent with a right total knee from 2014.   She denies any head injury, loss of consciousness, or preceding weakness or syncope.  Patient presents with pain and swelling to the right lower leg from the knee to the ankle.  She does report chronic swelling to the right lower leg since her TKR.  Past Medical History:  Diagnosis Date  . Anemia    vitamin d deficiency  . Arthritis   . Asthma    WELL CONTROLLED  . 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 Diabetes mellitus without complication ([place])   . Fatty liver   . Hypertension   . Kidney cysts    per patient, never had  . Renal disorder   . Sleep apnea    USES CPAP. waiting for new machine and a new sleep study  . Stroke [facility]) May or June 2019   TIA. no residual symptoms    Patient Active Problem List   Diagnosis Date Noted  . S/P cervical spinal fusion 09/24/2019  . Hyperlipidemia associated with type 2 diabetes mellitus ([place]) 08/08/2019  . Low back pain radiating to left lower extremity 08/07/2019  . Cervical myelopathy ([place]) 08/09/2018  . DJD (degenerative joint disease) of cervical spine 08/04/2018  . 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 History of ischemic stroke 03/30/2018  . Ischemic chest pain ([place]) 03/29/2018  . Chest pain  03/29/2018  . Sebaceous cyst 06/01/2016  . Obstructive apnea 05/31/2016  . Acid reflux 05/31/2016  . Essential (primary) hypertension 05/31/2016  . Abnormal Pap smear of cervix 05/31/2016  . Arthritis of knee, degenerative 07/25/2015  . History of artificial joint 07/25/2015  . Gonalgia 02/17/2015  . Type 2 diabetes mellitus ([place]) 10/23/2014  . 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 Diabetic polyneuropathy associated with type 2 diabetes mellitus ([place]) 05/16/2014  . Endometrial polyp 11/01/2013  . Fibroids, intramural 10/09/2013  . Pain due to knee joint prosthesis ([place]) 10/05/2013  . Body mass index (BMI) of 50-59.9 in adult ([place]) 10/03/2013  . Abnormal uterine bleeding 10/03/2013  . Morbid obesity ([place]) 10/01/2013  . Excessive and frequent menstruation with irregular cycle 10/01/2013  . Adaptive colitis 10/01/2013  . History of migraine headaches 10/01/2013  . H/O malignant neoplasm of skin 10/01/2013  . Fatty liver disease, nonalcoholic 31/54/0086  . Diverticulitis 10/01/2013  . Former smoker 08/29/2013  . H/O total knee replacement 08/29/2013  . Insomnia 08/29/2013  . Dysmenorrhea 08/29/2013  . Airway hyperreactivity 08/29/2013  . Absolute anemia 08/29/2013    Past Surgical History:  Procedure Laterality Date  . ANTERIOR CERVICAL DECOMP/DISCECTOMY FUSION N/A 08/09/2018   Procedure: ANTERIOR CERVICAL DECOMPRESSION/DISCECTOMY FUSION 1 LEVEL- C4-5;  Surgeon: Meade Maw, MD;  Location: [facility] ORS;  Service:  Neurosurgery;  Laterality: N/A;  . BACK SURGERY    . DILATION AND CURETTAGE OF UTERUS    . ENDOMETRIAL BIOPSY     benign  . EXPLORATORY LAPAROTOMY  1992   REMOVAL OF RUPTURED ECTOPIC  . HAND SURGERY Right 1998   cyst removed  . HERNIA REPAIR  5400   UMBILICAL  . JOINT REPLACEMENT Right 2014   TKR  . KNEE ARTHROSCOPY Right 2012  . KNEE SURGERY Right 2014   total knee  replacement  . SHOULDER ARTHROSCOPY WITH BICEPSTENOTOMY Left 12/14/2016   Procedure: SHOULDER ARTHROSCOPY WITH BICEPSTENOTOMY;  Surgeon: Corky Mull, MD;  Location: [facility] ORS;  Service: Orthopedics;  Laterality: Left;  . SHOULDER ARTHROSCOPY WITH OPEN ROTATOR CUFF REPAIR Left 12/14/2016   Procedure: SHOULDER ARTHROSCOPY WITH OPEN ROTATOR CUFF REPAIR AND ARTHROSCOPIC ROTATOR CUFF REPAIR;  Surgeon: Corky Mull, MD;  Location: [facility] ORS;  Service: Orthopedics;  Laterality: Left;  . SHOULDER ARTHROSCOPY WITH ROTATOR CUFF REPAIR Right 01/04/2019   Procedure: SHOULDER ARTHROSCOPY WITH ROTATOR CUFF REPAIR;  Surgeon: Corky Mull, MD;  Location: [facility] ORS;  Service: Orthopedics;  Laterality: Right;  . SHOULDER ARTHROSCOPY WITH SUBACROMIAL DECOMPRESSION Left 12/14/2016   Procedure: SHOULDER ARTHROSCOPY WITH SUBACROMIAL DECOMPRESSION;  Surgeon: Corky Mull, MD;  Location: [facility] ORS;  Service: Orthopedics;  Laterality: Left;  . SHOULDER ARTHROSCOPY WITH SUBACROMIAL DECOMPRESSION AND BICEP TENDON REPAIR Right 01/04/2019   Procedure: SHOULDER ARTHROSCOPY WITH DEBRIDEMENT AND SUBACROMIAL DECOMPRESSION-RIGHT;  Surgeon: Corky Mull, MD;  Location: [facility] ORS;  Service: Orthopedics;  Laterality: Right;  . TUBAL LIGATION      Prior to Admission medications   Medication Sig Start Date End Date Taking? Authorizing Provider  Accu-Chek FastClix Lancets MISC TEST TID 04/12/19   [provider]  ACCU-CHEK GUIDE test strip U TID UTD 04/12/19   [provider]  acetaminophen (TYLENOL) 500 MG tablet Take 2 tablets (1,000 mg total) by mouth 2 (two) times daily as needed for moderate pain. 11/29/19 11/28/20  Gillis Santa, MD  albuterol (VENTOLIN HFA) 108 (90 Base) MCG/ACT inhaler Inhale 2 puffs into the lungs every 6 (six) hours as needed for wheezing or shortness of breath. 10/24/19   Hubbard Hartshorn, FNP  atorvastatin (LIPITOR) 20 MG tablet Take 1 tablet (20 mg total) by mouth daily. 08/08/19   Hubbard Hartshorn, FNP   Blood Glucose Monitoring Suppl (ACCU-CHEK GUIDE) w/Device KIT U UTD TID FOR FINGERSTICK TESTING 04/10/19   [provider]  cyclobenzaprine (FLEXERIL) 5 MG tablet Take 1 tablet (5 mg total) by mouth 3 (three) times daily as needed. 03/02/20   Jd Mccaster, Dannielle Karvonen, PA-C  estradiol (ESTRACE) 1 MG tablet Take 1 tablet (1 mg total) by mouth daily. Patient not taking: Reported on 01/30/2020 08/14/19 08/13/20  Merlyn Lot, MD  Exenatide ER (BYDUREON) 2 MG PEN Inject 2 mg into the skin every Sunday.     [provider]  insulin glargine (LANTUS) 100 UNIT/ML injection Inject 1 mL (100 Units total) into the skin daily. 08/07/19   Hubbard Hartshorn, FNP  insulin lispro (HUMALOG) 100 UNIT/ML injection Inject 30 Units into the skin 3 (three) times daily before meals. 05/22/18   [provider]  Insulin Syringe-Needle U-100 (INSULIN SYRINGE 1CC/31GX5/16") 31G X 5/16" 1 ML MISC USE 1 QID 03/27/19   [provider]  ketorolac (TORADOL) 10 MG tablet Take 1 tablet (10 mg total) by mouth every 8 (eight) hours. 03/02/20   Letonya Mangels, Dannielle Karvonen, PA-C  medroxyPROGESTERone (  PROVERA) 10 MG tablet Take 1 tablet (10 mg total) by mouth daily. Patient not taking: Reported on 01/30/2020 01/77/93   Copland, Alicia B, PA-C  naproxen sodium (ALEVE) 220 MG tablet Take 220 mg by mouth daily as needed.    [provider]  norethindrone (AYGESTIN) 5 MG tablet Take 1 tablet (5 mg total) by mouth 2 (two) times daily for 21 days. As directed Patient not taking: Reported on 09/24/2019 09/12/19 10/03/19  Harlin Heys, MD  oxyCODONE-acetaminophen (PERCOCET) 5-325 MG tablet Take 1 tablet by mouth every 6 (six) hours as needed for up to 3 days for severe pain. 03/02/20 03/05/20  Charlott Calvario, Dannielle Karvonen, PA-C  pregabalin (LYRICA) 75 MG capsule Take 1 capsule (75 mg total) by mouth 2 (two) times daily. 11/29/19   Gillis Santa, MD  promethazine (PHENERGAN) 25 MG tablet Take 1 tablet (25 mg total) by  mouth every 6 (six) hours as needed for nausea or vomiting. Patient not taking: Reported on 01/30/2020 08/11/18   Marin Olp, PA-C  QUEtiapine (SEROQUEL) 50 MG tablet Take 50 mg by mouth at bedtime. 09/29/18   [provider]  tiZANidine (ZANAFLEX) 4 MG tablet Take 1.5 tablets (6 mg total) by mouth 2 (two) times daily as needed for muscle spasms. 09/24/19   Gillis Santa, MD  traZODone (DESYREL) 50 MG tablet Take 50 mg by mouth at bedtime as needed for sleep.  01/25/18   [provider]  venlafaxine (EFFEXOR) 37.5 MG tablet Take by mouth. 07/24/19 08/23/19  [provider]  Vitamin D, Ergocalciferol, (DRISDOL) 1.25 MG (50000 UT) CAPS capsule Take 50,000 Units by mouth every Monday. 04/25/18   [provider]    Allergies Morphine and related and Tramadol  Family History  Problem Relation Age of Onset  . Hypertension Mother   . CAD Maternal Grandmother   . Breast cancer Neg Hx   . Ovarian cancer Neg Hx   . Colon cancer Neg Hx     Social History Social History   Tobacco Use  . Smoking status: Former Smoker    Packs/day: 2.00    Years: 25.00    Pack years: 50.00    Types: Cigarettes    Quit date: 06/09/2017    Years since quitting: 2.7  . Smokeless tobacco: Never Used  Substance Use Topics  . Alcohol use: No  . Drug use: No    Review of Systems  Constitutional: Negative for fever. Eyes: Negative for visual changes. ENT: Negative for sore throat. Cardiovascular: Negative for chest pain. Respiratory: Negative for shortness of breath. Gastrointestinal: Negative for abdominal pain, vomiting and diarrhea. Genitourinary: Negative for dysuria. Musculoskeletal: Negative for back pain.  Right leg pain as above. Skin: Negative for rash. Neurological: Negative for headaches, focal weakness or numbness. ____________________________________________  PHYSICAL EXAM:  VITAL SIGNS: ED Triage Vitals  Enc Vitals Group     BP 03/02/20 1451 (!) 133/93      Pulse Rate 03/02/20 1451 68     Resp 03/02/20 1451 18     Temp 03/02/20 1451 99 F (37.2 C)     Temp Source 03/02/20 1451 Oral     SpO2 03/02/20 1451 96 %     Weight 03/02/20 1449 (!) 334 lb (151.5 kg)     Height 03/02/20 1449 _0  (1.676 m)     Head Circumference --      Peak Flow --      Pain Score 03/02/20 1449 10     Pain Loc --  Pain Edu? --      Excl. in [place]? --     Constitutional: Alert and oriented. Well appearing and in no distress. Head: Normocephalic and atraumatic. Eyes: Conjunctivae are normal. Normal extraocular movements Cardiovascular: Normal rate, regular rhythm. Normal distal pulses. Respiratory: Normal respiratory effort. No wheezes/rales/rhonchi. Gastrointestinal: Soft and nontender. No distention. Musculoskeletal: Right lower extremity without any obvious deformity, dislocation, or joint effusion.  Patient's leg with a central scar over the knee consistent with previous TKA on the Right.  Patient with normal active extension on the lower leg, but decreased flexion range secondary to pain.  She also localizes pain to the anterior shin, medial lateral ankle, and dorsal foot.  The range of motion with the ankle with flexion and extension.  Nontender with normal range of motion in all extremities.  Neurologic:  Normal gross sensation. Normal speech and language. No gross focal neurologic deficits are appreciated. Skin:  Skin is warm, dry and intact. No rash noted. ____________________________________________   RADIOLOGY  DG Right Femur Negative  DG Right Knee Negative  DG Right Tibia/Fibula Negative  DG Right Ankle Negative  DG Right Foot Negative  ____________________________________________  PROCEDURES  Percocet 5-325 mg PO Toradol 30 mg IM  Procedures ____________________________________________  INITIAL IMPRESSION / ASSESSMENT AND PLAN / ED COURSE  Patient with ED evaluation of injury sustained following mechanical fall.  She is without  complaint of head injury syncope, or preceding weakness.  Patient's concern is to the right leg which has a previous total knee replacement.  She fell as she came out of her house, down the steps.  X-rays of the lower leg from the hip to the foot are negative for any acute findings.  No disruption of the internal hardware to the knee.  Patient is reassured by her x-ray findings and her exam.  She was treated with IM Toradol and oral Percocet in the ED.  Prescription for the same will be sent home for her benefit.  She will follow-up with Dr. Arnette Schaumann for ongoing symptom management.  Return precautions have been reviewed.  Brenda Hicks was evaluated in Emergency Department on 03/02/2020 for the symptoms described in the history of present illness. She was evaluated in the context of the global COVID-19 pandemic, which necessitated consideration that the patient might be at risk for infection with the SARS-CoV-2 virus that causes COVID-19. Institutional protocols and algorithms that pertain to the evaluation of patients at risk for COVID-19 are in a state of rapid change based on information released by regulatory bodies including the CDC and federal and state organizations. These policies and algorithms were followed during the patient's care in the ED.  I reviewed the patient's prescription history over the last 12 months in the multi-state controlled substances database(s) that includes [place], [place], [place], [place], [place], [place], [place], [place], [place], [place], [place], [place], [place], and [place].  Results were notable for no current Narcotic RXs. ____________________________________________  FINAL CLINICAL IMPRESSION(S) / ED DIAGNOSES  Final diagnoses:  Fall in home, initial encounter  Acute pain of right knee  Pain of right lower extremity      Artisha Capri, Dannielle Karvonen, PA-C 03/02/20 1806    Vanessa Caberfae, MD 03/03/20 [phone]

## 2020-03-02 NOTE — Discharge Instructions (Signed)
Your exam and XRs are negative for any fracture, dislocation, or disruption to your total knee hardware. Take the prescription meds as directed. Apply ice to reduce swelling. Follow-up with Dr. Roland Rack for ongoing symptoms.

## 2020-03-02 NOTE — ED Triage Notes (Signed)
Pt states was walking down cement steps and fell, injuring R leg. A&O, in wheelchair. Speaking in complete sentences.

## 2020-03-10 DIAGNOSIS — S83401A Sprain of unspecified collateral ligament of right knee, initial encounter: Secondary | ICD-10-CM | POA: Insufficient documentation

## 2020-03-16 IMAGING — MR MRI OF THE RIGHT SHOULDER WITHOUT CONTRAST
5 of 6 series · 34 of 40 positions shown · non-contrast
Comparison: 07/04/2018

CLINICAL DATA: Pt had right rotator cuff and bicep surgery on
01/05/2019; pain is worse now than prior to surgery, unable to lift
right arm, swelling, pt states she has numbness from her neck into
right shoulder and down into her right hand.

EXAM:
MRI OF THE RIGHT SHOULDER WITHOUT CONTRAST
TECHNIQUE: Multiplanar, multisequence MR imaging of the shoulder was performed.
No intravenous contrast was administered.

[Series 6: PD fat-sat · oblique · right · 4.0mm · 0.44mm/px · 6 of 26 slices shown (1 of 3)]
[im 1/26]
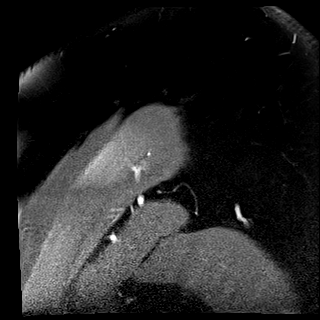
[im 6/26]
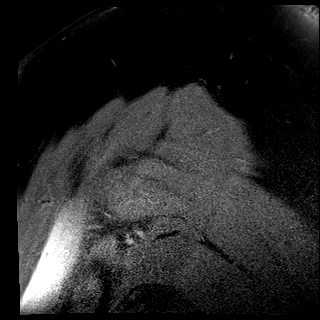
[im 11/26]
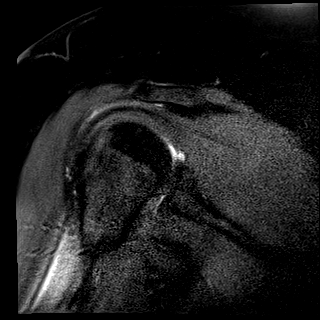
[im 16/26]
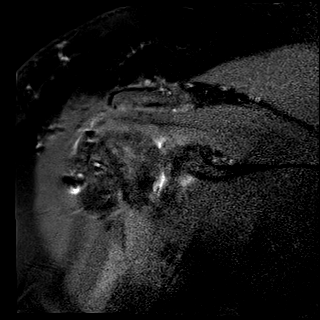
[im 21/26]
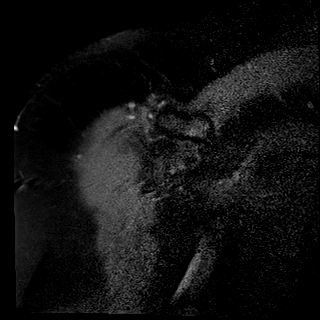
[im 26/26]
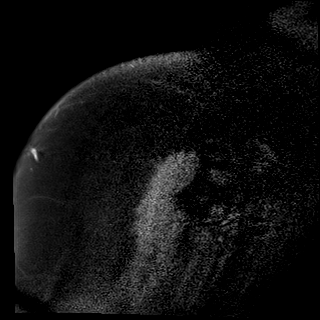

[Series 7: T2 fat-sat · oblique · right · 4.0mm · 0.44mm/px · 7 of 26 slices shown (1 of 2)]
[im 1/26]
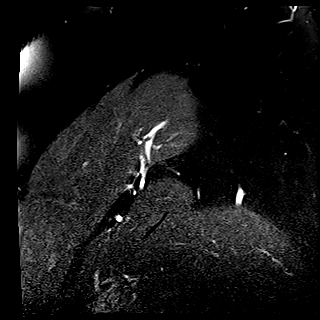
[im 5/26]
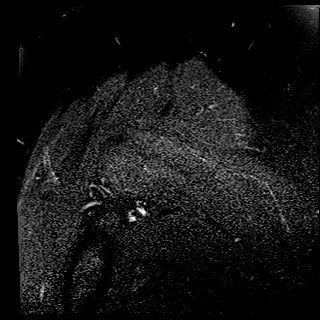
[im 9/26]
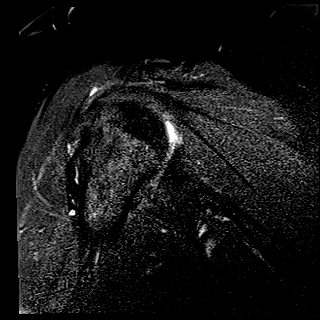
[im 13/26]
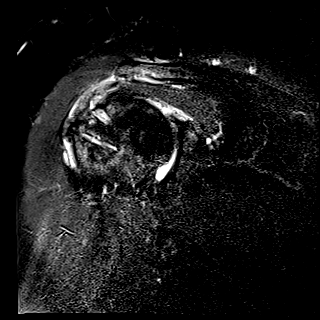
[im 17/26]
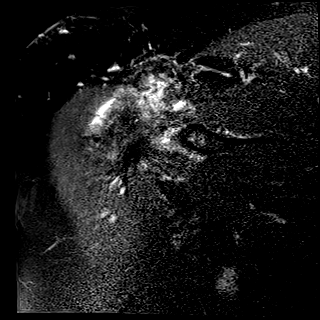
[im 21/26]
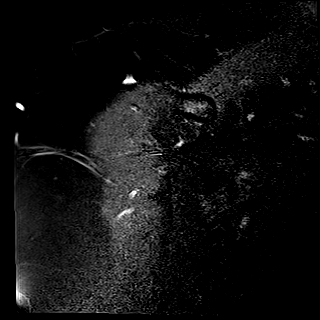
[im 26/26]
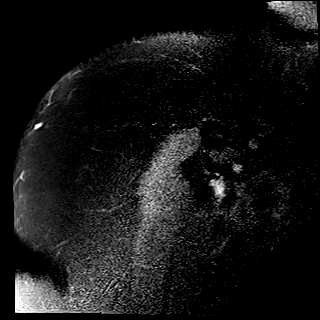

[Series 8: PD fat-sat · oblique · right · 4.0mm · 0.44mm/px · 7 of 26 slices shown (2 of 3)]
[im 1/26]
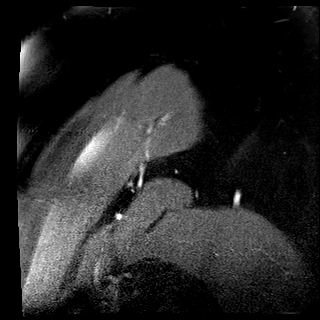
[im 5/26]
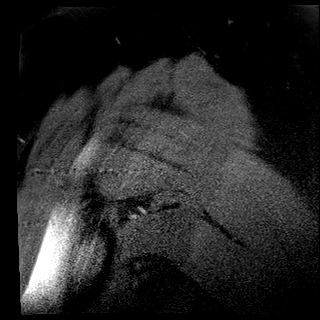
[im 9/26]
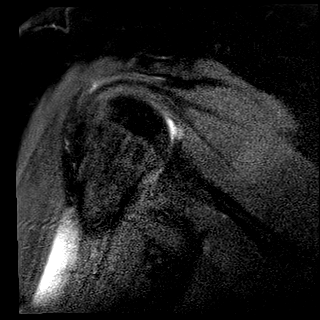
[im 13/26]
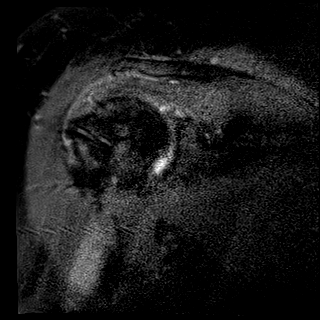
[im 17/26]
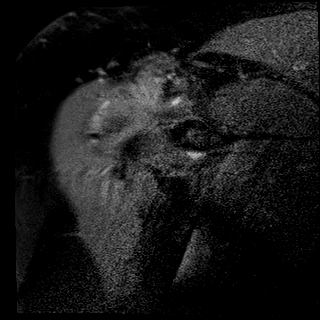
[im 21/26]
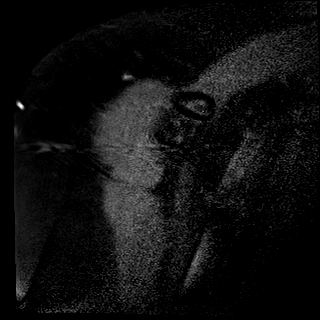
[im 26/26]
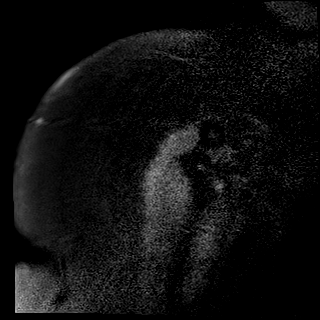

[Series 10: T2 fat-sat · oblique · right · 4.0mm · 0.23mm/px · 6 of 22 slices shown (2 of 2)]
[im 1/22]
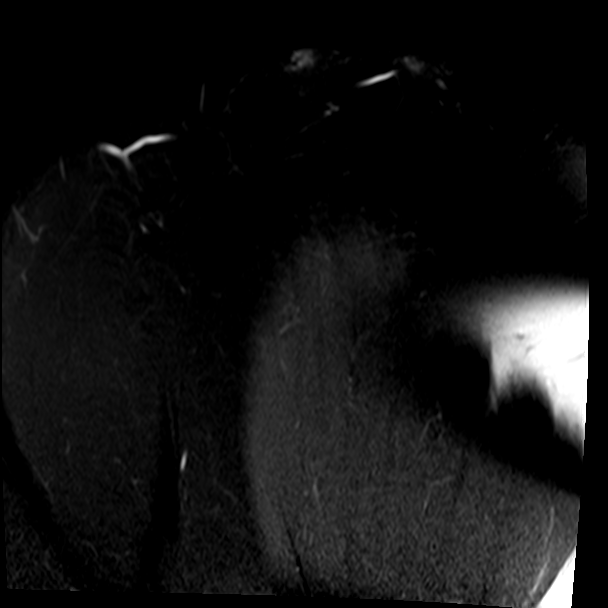
[im 5/22]
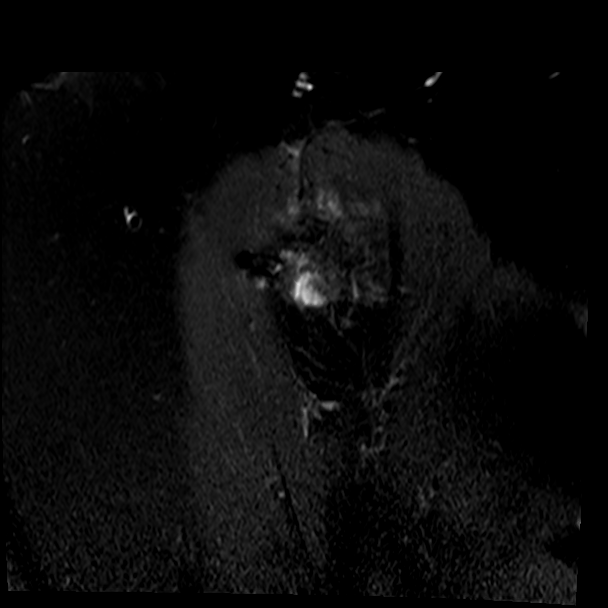
[im 9/22]
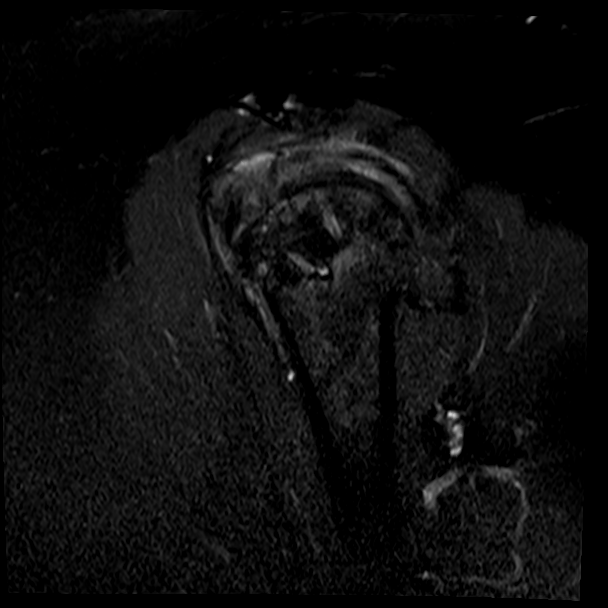
[im 13/22]
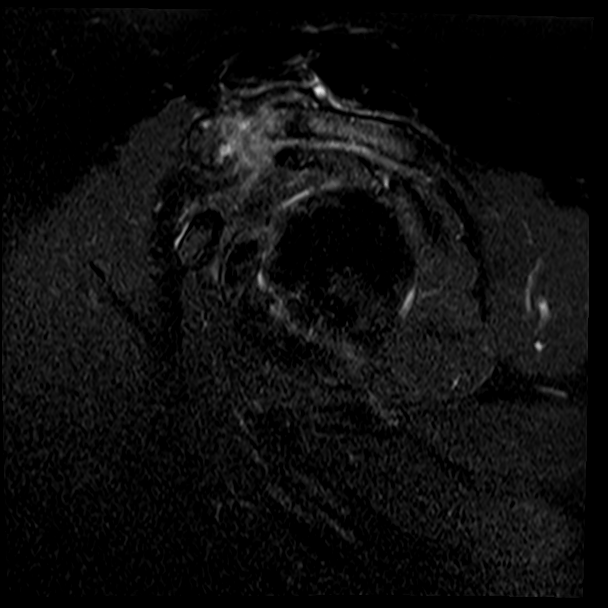
[im 17/22]
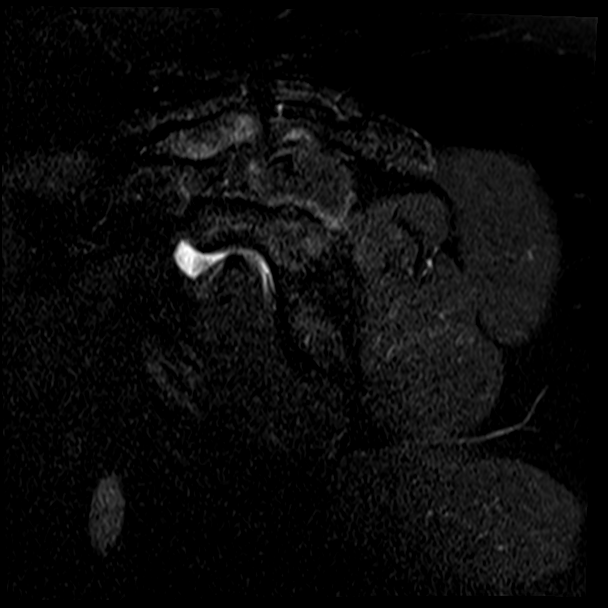
[im 22/22]
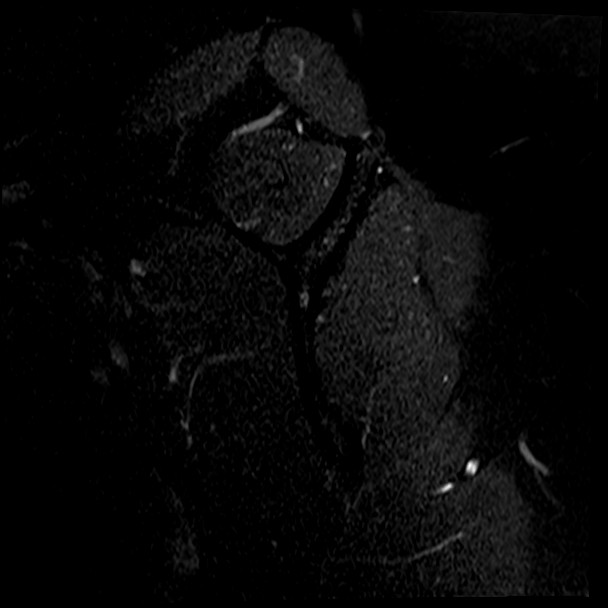

[Series 11: PD fat-sat · axial · right · 4.0mm · 0.55mm/px · z∈[-46,+93]mm · 8 of 30 slices shown (3 of 3)]
[im 1/30]
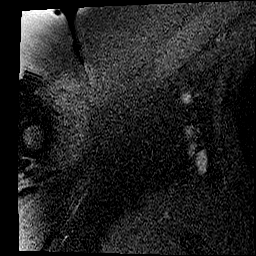
[im 5/30]
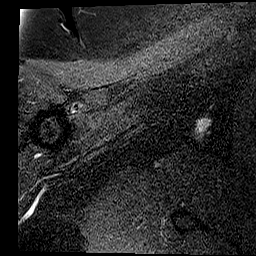
[im 9/30]
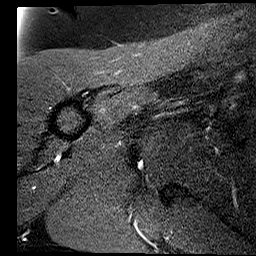
[im 13/30]
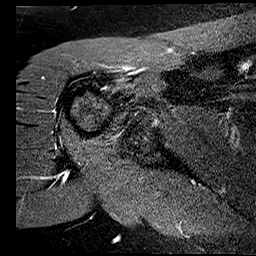
[im 17/30]
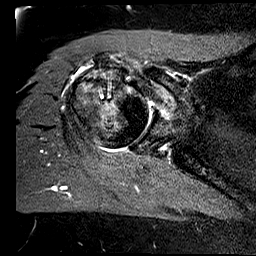
[im 21/30]
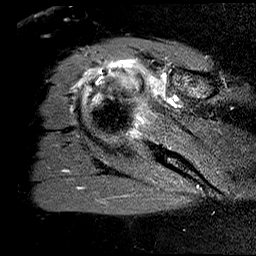
[im 25/30]
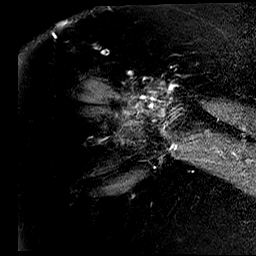
[im 30/30]
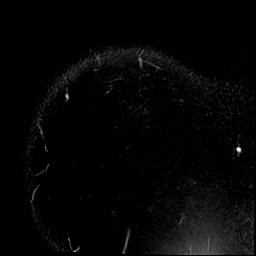

[34 of 40 positions shown; findings below may reference images not displayed]

FINDINGS: Rotator cuff: Prior rotator cuff repair. Severe tendinosis of the
supraspinatus tendon with irregularity along the bursal surface
which may reflect postsurgical changes versus a small bursal surface
tear. Severe tendinosis of the infraspinatus tendon. Teres minor
tendon is intact. Subscapularis tendon is intact.

Muscles: No atrophy or fatty replacement of nor abnormal signal
within, the muscles of the rotator cuff.

Biceps long head: Nonvisualization of the intra-articular portion of
the long head of the biceps tendon which may reflect prior tenodesis
or tenotomy.

Acromioclavicular Joint: Severe arthropathy of the acromioclavicular
joint. Type I acromion. Small amount of subacromial/subdeltoid
bursal fluid.

Glenohumeral Joint: Small joint effusion. Partial-thickness
cartilage loss of the glenohumeral joint.

Labrum: Superior labral degeneration with a small tear of the
superior posterior labrum and a 4 mm paralabral cyst.

Bones:  No acute osseous abnormality.  No aggressive osseous lesion.

Other: No fluid collection or hematoma.
IMPRESSION: 1. Prior rotator cuff repair. Severe tendinosis of the supraspinatus
tendon with irregularity along the bursal surface which may reflect
postsurgical changes versus a small bursal surface tear.
2. Severe tendinosis of the infraspinatus tendon.
3. Mild subacromial/subdeltoid bursitis.

## 2020-03-31 ENCOUNTER — Ambulatory Visit (INDEPENDENT_AMBULATORY_CARE_PROVIDER_SITE_OTHER): Payer: Medicaid Other | Admitting: Nurse Practitioner

## 2020-03-31 ENCOUNTER — Encounter (INDEPENDENT_AMBULATORY_CARE_PROVIDER_SITE_OTHER): Payer: Self-pay | Admitting: Nurse Practitioner

## 2020-03-31 ENCOUNTER — Other Ambulatory Visit: Payer: Self-pay

## 2020-03-31 ENCOUNTER — Ambulatory Visit (INDEPENDENT_AMBULATORY_CARE_PROVIDER_SITE_OTHER): Payer: Medicaid Other

## 2020-03-31 VITALS — BP 143/93 | HR 75 | Ht 66.0 in | Wt 329.0 lb

## 2020-03-31 DIAGNOSIS — M79605 Pain in left leg: Secondary | ICD-10-CM | POA: Diagnosis not present

## 2020-03-31 DIAGNOSIS — M7989 Other specified soft tissue disorders: Secondary | ICD-10-CM | POA: Diagnosis not present

## 2020-03-31 DIAGNOSIS — M545 Low back pain, unspecified: Secondary | ICD-10-CM

## 2020-03-31 DIAGNOSIS — IMO0002 Reserved for concepts with insufficient information to code with codable children: Secondary | ICD-10-CM | POA: Insufficient documentation

## 2020-03-31 DIAGNOSIS — I10 Essential (primary) hypertension: Secondary | ICD-10-CM

## 2020-03-31 DIAGNOSIS — R87619 Unspecified abnormal cytological findings in specimens from cervix uteri: Secondary | ICD-10-CM | POA: Insufficient documentation

## 2020-03-31 DIAGNOSIS — M79604 Pain in right leg: Secondary | ICD-10-CM | POA: Diagnosis not present

## 2020-03-31 DIAGNOSIS — M1990 Unspecified osteoarthritis, unspecified site: Secondary | ICD-10-CM | POA: Insufficient documentation

## 2020-03-31 NOTE — Progress Notes (Signed)
Subjective:    Patient ID: Brenda Hicks, female    DOB: 12/07/1965, 54 y.o.   MRN: [id number] Chief Complaint  Patient presents with  . New Patient (Initial Visit)    BLE pain ABI    The patient presents today for follow-up studies regarding evaluation and treatment of bilateral lower extremity leg pain.  The patient notes that she has had the symptoms for approximately 6 months or so.  She has been diagnosed with some lumbar degenerative disc disease with mild to moderate degenerative disc disease at L4-L5 with moderate central and foraminal stenosis at this level.  The patient has had injections done by pain management with little improvement.  The patient also notes that over the last few weeks she has been having swelling in her bilateral lower extremities with pain.  The swelling is consistently within her calves.  The patient also does note that she sits for extended periods on the side of the bed while doing hair, typically when she does this it is for an extended timeframe.  The patient notes that this is when the swelling tends to be worse.  She denies elevating her lower extremities or wearing compression socks regularly.  The patient does have some numbness and paresthesias but does have some diabetic neuropathy.  Recently muscle relaxers have also not been helpful for pain control.  Today the patient underwent bilateral ABIs.  Patient has an ABI of 1.07 on the right and 1.12 on the left.  Patient has strong triphasic tibial artery waveforms bilaterally in addition to having strong toe waveforms bilaterally.  The patient previously had a DVT study approximately a month or so ago which was negative.   Review of Systems  Cardiovascular: Positive for leg swelling.  Musculoskeletal: Positive for arthralgias and back pain.  Neurological: Positive for numbness.  All other systems reviewed and are negative.      Objective:   Physical Exam Vitals reviewed.  Constitutional:    Appearance: Normal appearance. She is obese.  Cardiovascular:     Rate and Rhythm: Normal rate and regular rhythm.     Pulses: Normal pulses.  Pulmonary:     Effort: Pulmonary effort is normal.     Breath sounds: Normal breath sounds.  Musculoskeletal:     Right lower leg: Edema present.     Left lower leg: Edema present.  Neurological:     Mental Status: She is alert and oriented to person, place, and time.  Psychiatric:        Mood and Affect: Mood normal.        Behavior: Behavior normal.        Thought Content: Thought content normal.        Judgment: Judgment normal.     BP (!) 143/93   Pulse 75   Ht '5\' 6"'  (1.676 m)   Wt (!) 329 lb (149.2 kg)   BMI 53.10 kg/m   Past Medical History:  Diagnosis Date  . Anemia    vitamin d deficiency  . Arthritis   . Asthma    WELL CONTROLLED  . 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 Diabetes mellitus without complication ([place])   . Fatty liver   . Hypertension   . Kidney cysts    per patient, never had  . Renal disorder   . Sleep apnea    USES CPAP. waiting for new machine and a new sleep study  . Stroke [facility]) May or June  2019   TIA. no residual symptoms    Social History   Socioeconomic History  . Marital status: Married    Spouse name: brian  . Number of children: 7  . Years of education: Not on file  . Highest education level: Not on file  Occupational History  . Occupation: disabled    Comment: disabled  Tobacco Use  . Smoking status: Former Smoker    Packs/day: 2.00    Years: 25.00    Pack years: 50.00    Types: Cigarettes    Quit date: 06/09/2017    Years since quitting: 2.8  . Smokeless tobacco: Never Used  Substance and Sexual Activity  . Alcohol use: No  . Drug use: No  . Sexual activity: Not Currently    Partners: Male    Birth control/protection: I.U.D.    Comment: Mirena  Other Topics Concern  . Not on file  Social History Narrative   Have 6 biological childrean and custody of niece since  13 months.   Social Determinants of Health   Financial Resource Strain: Low Risk   . Difficulty of Paying Living Expenses: Not hard at all  Food Insecurity: No Food Insecurity  . Worried About Charity fundraiser in the Last Year: Never true  . Ran Out of Food in the Last Year: Never true  Transportation Needs: No Transportation Needs  . Lack of Transportation (Medical): No  . Lack of Transportation (Non-Medical): No  Physical Activity: Inactive  . Days of Exercise per Week: 0 days  . Minutes of Exercise per Session: 0 min  Stress: No Stress Concern Present  . Feeling of Stress : Not at all  Social Connections: Not Isolated  . Frequency of Communication with Friends and Family: More than three times a week  . Frequency of Social Gatherings with Friends and Family: Never  . Attends Religious Services: More than 4 times per year  . Active Member of Clubs or Organizations: Yes  . Attends Archivist Meetings: More than 4 times per year  . Marital Status: Married  Human resources officer Violence: Not At Risk  . Fear of Current or Ex-Partner: No  . Emotionally Abused: No  . Physically Abused: No  . Sexually Abused: No    Past Surgical History:  Procedure Laterality Date  . ANTERIOR CERVICAL DECOMP/DISCECTOMY FUSION N/A 08/09/2018   Procedure: ANTERIOR CERVICAL DECOMPRESSION/DISCECTOMY FUSION 1 LEVEL- C4-5;  Surgeon: Meade Maw, MD;  Location: [facility] ORS;  Service: Neurosurgery;  Laterality: N/A;  . BACK SURGERY    . DILATION AND CURETTAGE OF UTERUS    . ENDOMETRIAL BIOPSY     benign  . EXPLORATORY LAPAROTOMY  1992   REMOVAL OF RUPTURED ECTOPIC  . HAND SURGERY Right 1998   cyst removed  . HERNIA REPAIR  4920   UMBILICAL  . JOINT REPLACEMENT Right 2014   TKR  . KNEE ARTHROSCOPY Right 2012  . KNEE SURGERY Right 2014   total knee replacement  . SHOULDER ARTHROSCOPY WITH BICEPSTENOTOMY Left 12/14/2016   Procedure: SHOULDER ARTHROSCOPY WITH BICEPSTENOTOMY;  Surgeon:  Corky Mull, MD;  Location: [facility] OR[facility];  Service: Orthopedics;  Laterality: Left;  . SHOULDER ARTHROSCOPY WITH OPEN ROTATOR CUFF REPAIR Left 12/14/2016   Procedure: SHOULDER ARTHROSCOPY WITH OPEN ROTATOR CUFF REPAIR AND ARTHROSCOPIC ROTATOR CUFF REPAIR;  Surgeon: Corky Mull, MD;  Location: [facility] OR[facility];  Service: Orthopedics;  Laterality: Left;  . SHOULDER ARTHROSCOPY WITH ROTATOR CUFF REPAIR Right 01/04/2019   Procedure: SHOULDER ARTHROSCOPY  WITH ROTATOR CUFF REPAIR;  Surgeon: Corky Mull, MD;  Location: [facility];  Service: Orthopedics;  Laterality: Right;  . SHOULDER ARTHROSCOPY WITH SUBACROMIAL DECOMPRESSION Left 12/14/2016   Procedure: SHOULDER ARTHROSCOPY WITH SUBACROMIAL DECOMPRESSION;  Surgeon: Corky Mull, MD;  Location: [facility] OR[facility];  Service: Orthopedics;  Laterality: Left;  . SHOULDER ARTHROSCOPY WITH SUBACROMIAL DECOMPRESSION AND BICEP TENDON REPAIR Right 01/04/2019   Procedure: SHOULDER ARTHROSCOPY WITH DEBRIDEMENT AND SUBACROMIAL DECOMPRESSION-RIGHT;  Surgeon: Corky Mull, MD;  Location: [facility] ORS;  Service: Orthopedics;  Laterality: Right;  . TUBAL LIGATION      Family History  Problem Relation Age of Onset  . Hypertension Mother   . CAD Maternal Grandmother   . Breast cancer Neg Hx   . Ovarian cancer Neg Hx   . Colon cancer Neg Hx     Allergies  Allergen Reactions  . Morphine And Related Nausea And Vomiting  . Tramadol     Nausea. If she takes antinausea medicine with this medicine, then she can tolerate it.       Assessment & Plan:   1. Leg swelling I have had a long discussion with the patient regarding swelling and why it  causes symptoms.  Patient will begin wearing graduated compression stockings class 1 (20-30 mmHg) on a daily basis a prescription was given. The patient will  beginning wearing the stockings first thing in the morning and removing them in the evening. The patient is instructed specifically not to sleep in the stockings.   In addition, behavioral  modification will be initiated.  This will include frequent elevation, use of over the counter pain medications and exercise such as walking.  I have reviewed systemic causes for chronic edema such as liver, kidney and cardiac etiologies.  The patient denies problems with these organ systems.    Consideration for a lymph pump will also be made based upon the effectiveness of conservative therapy.  This would help to improve the edema control and prevent sequela such as ulcers and infections   Patient should undergo duplex ultrasound of the venous system to evaluate for venous reflux.  The patient will return in 3 months  The patient will follow-up with me after the ultrasound.    2. Essential (primary) hypertension Continue antihypertensive medications as already ordered, these medications have been reviewed and there are no changes at this time.   3. Low back pain radiating to left lower extremity Based upon the patient's noninvasive studies today, I feel strongly that her leg pain is related more so to her lumbar issues.  Patient is also overweight which will exacerbate lumbar issues as well.  Patient is advised to continue to follow with her PCP and orthopedic for further work-up and treatment of leg pain.   Current Outpatient Medications on File Prior to Visit  Medication Sig Dispense Refill  . Accu-Chek FastClix Lancets MISC TEST TID    . ACCU-CHEK GUIDE test strip U TID UTD    . acetaminophen (TYLENOL) 500 MG tablet Take 2 tablets (1,000 mg total) by mouth 2 (two) times daily as needed for moderate pain. 100 tablet 2  . albuterol (VENTOLIN HFA) 108 (90 Base) MCG/ACT inhaler Inhale 2 puffs into the lungs every 6 (six) hours as needed for wheezing or shortness of breath. 18 g 3  . atorvastatin (LIPITOR) 20 MG tablet Take 1 tablet (20 mg total) by mouth daily. 90 tablet 3  . Blood Glucose Monitoring Suppl (ACCU-CHEK GUIDE) w/Device KIT U UTD TID FOR  FINGERSTICK TESTING    .  cyclobenzaprine (FLEXERIL) 5 MG tablet Take 1 tablet (5 mg total) by mouth 3 (three) times daily as needed. 15 tablet 0  . Exenatide ER (BYDUREON) 2 MG PEN Inject 2 mg into the skin every Sunday.     . insulin glargine (LANTUS) 100 UNIT/ML injection Inject 1 mL (100 Units total) into the skin daily. 10 mL 11  . insulin lispro (HUMALOG) 100 UNIT/ML injection Inject 30 Units into the skin 3 (three) times daily before meals.    . Insulin Syringe-Needle U-100 (INSULIN SYRINGE 1CC/31GX5/16") 31G X 5/16" 1 ML MISC USE 1 QID    . ketorolac (TORADOL) 10 MG tablet Take 1 tablet (10 mg total) by mouth every 8 (eight) hours. 15 tablet 0  . naproxen sodium (ALEVE) 220 MG tablet Take 220 mg by mouth daily as needed.    Marland Kitchen tiZANidine (ZANAFLEX) 4 MG tablet Take 1.5 tablets (6 mg total) by mouth 2 (two) times daily as needed for muscle spasms. 60 tablet 2  . Vitamin D, Ergocalciferol, (DRISDOL) 1.25 MG (50000 UT) CAPS capsule Take 50,000 Units by mouth every Monday.    . estradiol (ESTRACE) 1 MG tablet Take 1 tablet (1 mg total) by mouth daily. (Patient not taking: Reported on 01/30/2020) 21 tablet 0  . medroxyPROGESTERone (PROVERA) 10 MG tablet Take 1 tablet (10 mg total) by mouth daily. (Patient not taking: Reported on 01/30/2020) 10 tablet 0  . norethindrone (AYGESTIN) 5 MG tablet Take 1 tablet (5 mg total) by mouth 2 (two) times daily for 21 days. As directed (Patient not taking: Reported on 09/24/2019) 42 tablet 0  . pregabalin (LYRICA) 75 MG capsule Take 1 capsule (75 mg total) by mouth 2 (two) times daily. (Patient not taking: Reported on 03/31/2020) 60 capsule 1  . promethazine (PHENERGAN) 25 MG tablet Take 1 tablet (25 mg total) by mouth every 6 (six) hours as needed for nausea or vomiting. (Patient not taking: Reported on 01/30/2020) 20 tablet 0  . QUEtiapine (SEROQUEL) 50 MG tablet Take 50 mg by mouth at bedtime.    . traZODone (DESYREL) 50 MG tablet Take 50 mg by mouth at bedtime as needed for sleep.   5  .  venlafaxine (EFFEXOR) 37.5 MG tablet Take by mouth.     No current facility-administered medications on file prior to visit.    There are no Patient Instructions on file for this visit. No follow-ups on file.   Kris Hartmann, NP

## 2020-04-01 ENCOUNTER — Encounter (INDEPENDENT_AMBULATORY_CARE_PROVIDER_SITE_OTHER): Payer: Self-pay | Admitting: Nurse Practitioner

## 2020-04-14 ENCOUNTER — Other Ambulatory Visit: Payer: Self-pay | Admitting: Obstetrics and Gynecology

## 2020-04-14 ENCOUNTER — Other Ambulatory Visit: Payer: Self-pay | Admitting: Student in an Organized Health Care Education/Training Program

## 2020-04-14 DIAGNOSIS — N939 Abnormal uterine and vaginal bleeding, unspecified: Secondary | ICD-10-CM

## 2020-04-14 DIAGNOSIS — N921 Excessive and frequent menstruation with irregular cycle: Secondary | ICD-10-CM

## 2020-04-24 NOTE — Progress Notes (Signed)
Patient ID: Brenda Hicks, female    DOB: 10/28/1966, 54 y.o.   MRN: [id number]  PCP: Hubbard Hartshorn, FNP  Chief Complaint  Patient presents with  . Diarrhea    anytime she eats or drinks she has to go to the bathroom 1-4 minutes after and has a BM/loose stool/diarrhea    Subjective:   Brenda Hicks is a 54 y.o. female, presents to clinic with CC of the following:  Chief Complaint  Patient presents with  . Diarrhea    anytime she eats or drinks she has to go to the bathroom 1-4 minutes after and has a BM/loose stool/diarrhea    HPI:  Patient is a 54 year old female patient of Raelyn Ensign She is here with her 2 children in the room as well. Her last visit with Raquel Sarna was in October 2020, with an extensive active problem list noted She presents today with increasing need to defecate after a meal for the past 3-4 months She also has many medicines that she wants to get refilled  Of note, she presented to OB/GYN for her annual woman's health evaluation in March 2021, and a colonoscopy was ordered at that visit, not yet obtained.  She notes that whenever she tries to eat or drink something, she has to go right to the bathroom, and notes sometimes the stools are soft sometimes watery, no blood noted.  She notes often can be 10 times a day she is going to bathroom.  She also gets lower abdominal cramping type pains, more on the left side than the right side.  The pain seems more intermittent as well.  She notes it has been the last 3 to 4 months that this has been happening, although noted it even was present about a year plus, with it worse in the past 3 to 4 months.  She was taken off the Metformin by the endocrinologist in late 2020.  She has had no recent antibiotics. She has had no vomiting, denies any infectious symptoms with no fevers, not feeling ill with no muscle aches or flulike symptoms.  No dysuria or blood in the urine.  She does note some occasional epigastric  discomfort, burning like reflux.  Has been more intermittent.  Denies any chest pains, palpitations, or shortness of breath. She asked for a refill of her promethazine which she uses as needed for nausea, and when asked how often she uses it, she noted not often as it often makes her nausea symptoms worsen and makes her feel sicker.  Thus, did not refill today. She notes she has had increased headaches, and does not check her blood pressures at home.  Her blood pressures have been elevated on recent visits with other providers as well as being elevated here today.  She notes she takes lisinopril 40 mg for her blood pressure She has been followed for hypertension in the past, with recent blood pressures noted to be elevated. BP Readings from Last 3 Encounters:  04/25/20 (!) 136/98  03/31/20 (!) 143/93  03/02/20 (!) 171/86   She is on numerous other medications, and when reviewed including the ones she requested refills of, she noted not sure why on some and some not helpful. She requested refills of a Toradol product, which I did not refill due to concerns with her abdominal pains presently and also the chronic use of this medicine can also affect the kidneys over time. She requested a refill of her Flexeril and a Zanaflex  product, both muscle relaxers, often used as needed, and did not refill those today either.  She was seen in the past by [facility] for mental health issues, and had a therapist there to help, although has not returned to see them.  She noted that therapist there had put her on Seroquel, had been on Ambien to help her sleep in the past although that was changed to trazodone, and she notes the trazodone really has not been all that helpful and she still has some difficulty sleeping.  She also takes an Effexor product.  I recommended today that she get in touch with [facility] again to follow-up, and she stated she could do so to help assess for the needs of those medicines and recommendations in that  regard.  She also has not followed up with endocrine after the last visit as was recommended. Also, her diabetes has not been well controlled, with the lowest A1c in the past years noted to be 10.0, and she was referred back to endocrine for help to get better control, and was last seen by them in November 2020, with a follow-up recommended in 12 weeks, although has not occurred. Lab Results  Component Value Date   HGBA1C 10.0 (H) 08/07/2019  She noted she does not check her blood sugars as she is out of the strips, and we can refill those, and also refilled her Bydureon for her blood sugar control. Also refilled her atorvastatin which is important given she is diabetic.    Patient Active Problem List   Diagnosis Date Noted  . Abnormal Pap smear 03/31/2020  . Arthritis 03/31/2020  . Sprain of collateral ligament of right knee 03/10/2020  . Leg swelling 02/08/2020  . S/P cervical spinal fusion 09/24/2019  . Hyperlipidemia associated with type 2 diabetes mellitus ([place]) 08/08/2019  . Low back pain radiating to left lower extremity 08/07/2019  . Nontraumatic incomplete tear of right rotator cuff 01/04/2019  . Tendinitis of upper biceps tendon of right shoulder 01/04/2019  . Rotator cuff tendinitis, right 12/01/2018  . Cervical myelopathy ([place]) 08/09/2018  . DJD (degenerative joint disease) of cervical spine 08/04/2018  . 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 History of ischemic stroke 03/30/2018  . Ischemic chest pain ([place]) 03/29/2018  . Chest pain 03/29/2018  . Sebaceous cyst 06/01/2016  . Obstructive apnea 05/31/2016  . Acid reflux 05/31/2016  . Essential (primary) hypertension 05/31/2016  . Abnormal Pap smear of cervix 05/31/2016  . Arthritis of knee, degenerative 07/25/2015  . History of artificial joint 07/25/2015  . Gonalgia 02/17/2015  . Type 2 diabetes mellitus ([place]) 10/23/2014  . 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 Diabetic polyneuropathy associated with type 2 diabetes mellitus ([place]) 05/16/2014  . Endometrial polyp 11/01/2013  . Fibroids, intramural 10/09/2013  . Pain due to knee joint prosthesis ([place]) 10/05/2013  . Body mass index (BMI) of 50-59.9 in adult ([place]) 10/03/2013  . Abnormal uterine bleeding 10/03/2013  . Morbid obesity ([place]) 10/01/2013  . Excessive and frequent menstruation with irregular cycle 10/01/2013  . Adaptive colitis 10/01/2013  . History of migraine headaches 10/01/2013  . H/O malignant neoplasm of skin 10/01/2013  . Fatty liver disease, nonalcoholic 09/98/3382  . Diverticulitis 10/01/2013  . Former smoker 08/29/2013  . H/O total knee replacement 08/29/2013  . Insomnia 08/29/2013  . Dysmenorrhea 08/29/2013  . Airway hyperreactivity 08/29/2013  . Absolute anemia 08/29/2013      Current Outpatient  Medications:  .  albuterol (VENTOLIN HFA) 108 (90 Base) MCG/ACT inhaler, Inhale 2 puffs into the lungs every 6 (six) hours as needed for wheezing or shortness of breath., Disp: 18 g, Rfl: 3 .  cyclobenzaprine (FLEXERIL) 5 MG tablet, Take 1 tablet (5 mg total) by mouth 3 (three) times daily as needed., Disp: 15 tablet, Rfl: 0 .  Exenatide ER (BYDUREON) 2 MG PEN, Inject 2 mg into the skin every Sunday. , Disp: , Rfl:  .  insulin glargine (LANTUS) 100 UNIT/ML injection, Inject 1 mL (100 Units total) into the skin daily., Disp: 10 mL, Rfl: 11 .  insulin lispro (HUMALOG) 100 UNIT/ML injection, Inject 30 Units into the skin 3 (three) times daily before meals., Disp: , Rfl:  .  Insulin Syringe-Needle U-100 (INSULIN SYRINGE 1CC/31GX5/16") 31G X 5/16" 1 ML MISC, USE 1 QID, Disp: , Rfl:  .  naproxen sodium (ALEVE) 220 MG tablet, Take 220 mg by mouth daily as needed., Disp: , Rfl:  .  promethazine (PHENERGAN) 25 MG tablet, Take 1 tablet (25 mg total) by mouth every 6 (six) hours as needed for nausea or vomiting., Disp: 20 tablet, Rfl: 0 .   tiZANidine (ZANAFLEX) 4 MG tablet, Take 1.5 tablets (6 mg total) by mouth 2 (two) times daily as needed for muscle spasms., Disp: 60 tablet, Rfl: 2 .  traZODone (DESYREL) 50 MG tablet, Take 50 mg by mouth at bedtime as needed for sleep. , Disp: , Rfl: 5 .  Vitamin D, Ergocalciferol, (DRISDOL) 1.25 MG (50000 UT) CAPS capsule, Take 50,000 Units by mouth every Monday., Disp: , Rfl:  .  Accu-Chek FastClix Lancets MISC, TEST TID, Disp: , Rfl:  .  ACCU-CHEK GUIDE test strip, U TID UTD, Disp: , Rfl:  .  atorvastatin (LIPITOR) 20 MG tablet, Take 1 tablet (20 mg total) by mouth daily. (Patient not taking: Reported on 04/25/2020), Disp: 90 tablet, Rfl: 3 .  Blood Glucose Monitoring Suppl (ACCU-CHEK GUIDE) w/Device KIT, U UTD TID FOR FINGERSTICK TESTING, Disp: , Rfl:  .  ketorolac (TORADOL) 10 MG tablet, Take 1 tablet (10 mg total) by mouth every 8 (eight) hours. (Patient not taking: Reported on 04/25/2020), Disp: 15 tablet, Rfl: 0 .  QUEtiapine (SEROQUEL) 50 MG tablet, Take 50 mg by mouth at bedtime., Disp: , Rfl:  .  venlafaxine (EFFEXOR) 37.5 MG tablet, Take by mouth., Disp: , Rfl:    Allergies  Allergen Reactions  . Morphine And Related Nausea And Vomiting  . Tramadol     Nausea. If she takes antinausea medicine with this medicine, then she can tolerate it.     Past Surgical History:  Procedure Laterality Date  . ANTERIOR CERVICAL DECOMP/DISCECTOMY FUSION N/A 08/09/2018   Procedure: ANTERIOR CERVICAL DECOMPRESSION/DISCECTOMY FUSION 1 LEVEL- C4-5;  Surgeon: Meade Maw, MD;  Location: [facility] ORS;  Service: Neurosurgery;  Laterality: N/A;  . BACK SURGERY    . DILATION AND CURETTAGE OF UTERUS    . ENDOMETRIAL BIOPSY     benign  . EXPLORATORY LAPAROTOMY  1992   REMOVAL OF RUPTURED ECTOPIC  . HAND SURGERY Right 1998   cyst removed  . HERNIA REPAIR  2993   UMBILICAL  . JOINT REPLACEMENT Right 2014   TKR  . KNEE ARTHROSCOPY Right 2012  . KNEE SURGERY Right 2014   total knee replacement  .  SHOULDER ARTHROSCOPY WITH BICEPSTENOTOMY Left 12/14/2016   Procedure: SHOULDER ARTHROSCOPY WITH BICEPSTENOTOMY;  Surgeon: Corky Mull, MD;  Location: [facility] OR[facility];  Service: Orthopedics;  Laterality: Left;  . SHOULDER ARTHROSCOPY WITH OPEN ROTATOR CUFF REPAIR Left 12/14/2016   Procedure: SHOULDER ARTHROSCOPY WITH OPEN ROTATOR CUFF REPAIR AND ARTHROSCOPIC ROTATOR CUFF REPAIR;  Surgeon: Corky Mull, MD;  Location: [facility] OR[facility];  Service: Orthopedics;  Laterality: Left;  . SHOULDER ARTHROSCOPY WITH ROTATOR CUFF REPAIR Right 01/04/2019   Procedure: SHOULDER ARTHROSCOPY WITH ROTATOR CUFF REPAIR;  Surgeon: Corky Mull, MD;  Location: [facility];  Service: Orthopedics;  Laterality: Right;  . SHOULDER ARTHROSCOPY WITH SUBACROMIAL DECOMPRESSION Left 12/14/2016   Procedure: SHOULDER ARTHROSCOPY WITH SUBACROMIAL DECOMPRESSION;  Surgeon: Corky Mull, MD;  Location: [facility];  Service: Orthopedics;  Laterality: Left;  . SHOULDER ARTHROSCOPY WITH SUBACROMIAL DECOMPRESSION AND BICEP TENDON REPAIR Right 01/04/2019   Procedure: SHOULDER ARTHROSCOPY WITH DEBRIDEMENT AND SUBACROMIAL DECOMPRESSION-RIGHT;  Surgeon: Corky Mull, MD;  Location: [facility];  Service: Orthopedics;  Laterality: Right;  . TUBAL LIGATION       Family History  Problem Relation Age of Onset  . Hypertension Mother   . CAD Maternal Grandmother   . Breast cancer Neg Hx   . Ovarian cancer Neg Hx   . Colon cancer Neg Hx      Social History   Tobacco Use  . Smoking status: Former Smoker    Packs/day: 2.00    Years: 25.00    Pack years: 50.00    Types: Cigarettes    Quit date: 06/09/2017    Years since quitting: 2.8  . Smokeless tobacco: Never Used  Substance Use Topics  . Alcohol use: No    With staff assistance, above reviewed with the patient today.  ROS: As per HPI, otherwise no specific complaints on a limited and focused system review   No results found for this or any previous visit (from the past 72  hour(s)).   PHQ2/9: Depression screen [facility] 2/9 04/25/2020 09/24/2019 08/30/2019 08/07/2019  Decreased Interest 0 0 0 0  Down, Depressed, Hopeless 0 0 0 0  PHQ - 2 Score 0 0 0 0  Altered sleeping 2 - 0 3  Tired, decreased energy 3 - 0 3  Change in appetite 1 - 0 3  Feeling bad or failure about yourself  0 - 0 0  Trouble concentrating 0 - 0 3  Moving slowly or fidgety/restless 0 - 0 3  Suicidal thoughts 0 - 0 0  PHQ-9 Score 6 - 0 15  Difficult doing work/chores Not difficult at all - Not difficult at all Somewhat difficult   PHQ-2/9 Result reviewed  Fall Risk: Fall Risk  04/25/2020 10/31/2019 09/24/2019 08/30/2019 08/07/2019  Falls in the past year? 1 0 0 0 1  Number falls in past yr: 1 - 0 0 1  Injury with Fall? 1 - - 0 1  Follow up - - - Falls evaluation completed Falls evaluation completed      Objective:   Vitals:   04/25/20 1111  BP: (!) 136/98  Pulse: 70  Resp: 20  Temp: 97.8 F (36.6 C)  TempSrc: Temporal  SpO2: 98%  Weight: (!) 335 lb 8 oz (152.2 kg)  Height: _0  (1.676 m)    Body mass index is 54.15 kg/m.  Physical Exam   NAD, masked, morbidly obese HEENT - [place]/AT, sclera anicteric, PERRL, EOMI, conj - non-inj'ed, pharynx clear Neck - supple, no adenopathy Car - RRR without m/g/r Pulm- RR and effort normal at rest, CTA without wheeze or rales Abd - soft, obese, diffusely tender, at the level of the umbilicus and  in the lower quadrants bilateral, more tender in the left versus right lower quadrant, no rebound or guarding, no focal McBurney's point tenderness, ND, BS+,  no obvious masses Back - no CVA tenderness Ext -1-2+ bilateral LE edema, not new per the patient  Neuro/psychiatric - affect was not flat, appropriate with conversation  Alert   Grossly non-focal   Speech  normal   Results for orders placed or performed in visit on 11/06/19  POCT hemoglobin  Result Value Ref Range   Hemoglobin 11.7 11 - 14.6 g/dL  Surgical pathology  Result Value Ref Range    SURGICAL PATHOLOGY      SURGICAL PATHOLOGY CASE: [id number] PATIENT: Otilio Connors Surgical Pathology Report     Clinical History: AUB, thickened endometrium (cm)     FINAL MICROSCOPIC DIAGNOSIS:  A. ENDOMETRIUM, BIOPSY: - Inactive endometrium with progestational changes. - No hyperplasia or malignancy.   GROSS DESCRIPTION:  Received in formalin are tan, hemorrhagic soft tissue fragments that are entirely submitted. Volume: 1.4 x 1.4 x 0.25 cm. (1 B)  SW 11/06/2019     Final Diagnosis performed by Claudette Laws, MD.   Electronically signed 11/07/2019 Technical and / or Professional components performed at [facility]. [facility], [place] [address] [place], [place] [address].  Immunohistochemistry Technical component (if applicable) was performed at [facility]. [address] [place], [place], [place] [address].   IMMUNOHISTOCHEMISTRY DISCLAIMER (if applicable): Some of these immunohistochemical stains may have been developed and the performance  characteristics determine by [facility]. Some may not have been cleared or approved by the U.S. Food and Drug Administration. The FDA has determined that such clearance or approval is not necessary. This test is used for clinical purposes. It should not be regarded as investigational or for research. This laboratory is certified under the [place] (CLIA-88) as qualified to perform high complexity clinical laboratory testing.  The controls stained appropriately.        Assessment & Plan:   1. Type 2 diabetes mellitus with hyperglycemia, with long-term current use of insulin (HCC) Noted concerns as she has not followed up with endocrine, and the importance of keeping her blood sugars controlled. She is not checking them at home at this point. Refill of the Bydureon medicine she was out of today.  Also refilled the statin  product. Emphasized the importance of calling endocrine and scheduling a follow-up appointment and she states she will do so.  Cannot exclude that her diabetes may be contributing some to his recent symptoms. We will check some labs today as well.  Add an A1c to the comp panel due to her diabetes history. - atorvastatin (LIPITOR) 20 MG tablet; Take 1 tablet (20 mg total) by mouth daily.  Dispense: 90 tablet; Refill: 3 - Exenatide ER (BYDUREON) 2 MG PEN; Inject 2 mg into the skin every Sunday.  Dispense: 4 each; Refill: 2 - COMPLETE METABOLIC PANEL WITH GFR - Hemoglobin A1c  2. Hyperlipidemia associated with type 2 diabetes mellitus ([place]) Refill the statin product today - atorvastatin (LIPITOR) 20 MG tablet; Take 1 tablet (20 mg total) by mouth daily.  Dispense: 90 tablet; Refill: 3 - COMPLETE METABOLIC PANEL WITH GFR  3. Essential hypertension Her blood pressure has not been well controlled in the recent past, and she has been having more headaches, likely related to that. On the initial review of medicines, lisinopril was not on the list, although she notes that she has been  taking 40 mg of lisinopril. Do feel adding a diuretic to this, especially noting her lower extremity swelling would be helpful, and changed her medicine to Zestoretic-20-12.5, to take 2 tablets daily Also check some labs today - lisinopril-hydrochlorothiazide (ZESTORETIC) 20-12.5 MG tablet; Take 2 tablets by mouth daily.  Dispense: 90 tablet; Refill: 3 - COMPLETE METABOLIC PANEL WITH GFR  4. Morbid obesity (HCC) Wt Readings from Last 3 Encounters:  04/25/20 (!) 335 lb 8 oz (152.2 kg)  03/31/20 (!) 329 lb (149.2 kg)  03/02/20 (!) 334 lb (151.5 kg)   She has not had success in weight loss attempts in the recent past 5. Lower abdominal pain 6. Frequent bowel movements The exact cause of these symptoms are unclear, and given the duration, seems less likely for an acute obstruction concern.  We will get an x-ray  presently. Diverticulitis is a possibility, with her discomfort more left-sided noted.  Encouraged not febrile or feeling ill presently. Also will check some lab test, with an infectious diarrheal concerns seemingly less likely as well. We will ensure her electrolytes are okay with the frequent loose bowel movements. Await these results presently, and did inform they will likely not be reviewed by myself until Monday, and she was understanding of that. May need gastroenterology input as well noted, and she was ordered to have a colonoscopy done for screening purposes after her last visit with OB/GYN noted above and likely will be helpful as part of this work-up. Needs to stay well-hydrated presently - COMPLETE METABOLIC PANEL WITH GFR - Urinalysis, Complete - DG Abd 1 View; Future  7. Insomnia, unspecified type Hesitant to add more medicines at this point noting the symptoms above, and she notes [facility] has been involved in the past to help.  Do feel gaining their input again will be helpful.   Await lab results and x-ray today, with possible referral to GI over time pending her status and these results, with a specific follow-up date recommended after these return.  We will need to ensure her blood pressure is better controlled with the additional medicine added today.      Towanda Malkin, MD 04/25/20 11:31 AM

## 2020-04-25 ENCOUNTER — Ambulatory Visit
Admission: RE | Admit: 2020-04-25 | Discharge: 2020-04-25 | Disposition: A | Discharge status: Home / Self Care | Payer: Medicaid Other | Attending: Internal Medicine | Admitting: Internal Medicine

## 2020-04-25 ENCOUNTER — Ambulatory Visit (INDEPENDENT_AMBULATORY_CARE_PROVIDER_SITE_OTHER): Payer: Medicaid Other | Admitting: Internal Medicine

## 2020-04-25 ENCOUNTER — Other Ambulatory Visit: Payer: Self-pay

## 2020-04-25 ENCOUNTER — Ambulatory Visit
Admission: RE | Admit: 2020-04-25 | Discharge: 2020-04-25 | Disposition: A | Discharge status: Home / Self Care | Payer: Medicaid Other | Source: Ambulatory Visit | Attending: Internal Medicine | Admitting: Internal Medicine

## 2020-04-25 ENCOUNTER — Encounter: Payer: Self-pay | Admitting: Internal Medicine

## 2020-04-25 VITALS — BP 136/98 | HR 70 | Temp 97.8°F | Resp 20 | Ht 66.0 in | Wt 335.5 lb

## 2020-04-25 DIAGNOSIS — R194 Change in bowel habit: Secondary | ICD-10-CM | POA: Insufficient documentation

## 2020-04-25 DIAGNOSIS — E785 Hyperlipidemia, unspecified: Secondary | ICD-10-CM

## 2020-04-25 DIAGNOSIS — Z794 Long term (current) use of insulin: Secondary | ICD-10-CM

## 2020-04-25 DIAGNOSIS — I10 Essential (primary) hypertension: Secondary | ICD-10-CM | POA: Diagnosis not present

## 2020-04-25 DIAGNOSIS — E1165 Type 2 diabetes mellitus with hyperglycemia: Secondary | ICD-10-CM | POA: Diagnosis not present

## 2020-04-25 DIAGNOSIS — R1030 Lower abdominal pain, unspecified: Secondary | ICD-10-CM | POA: Diagnosis present

## 2020-04-25 DIAGNOSIS — E1169 Type 2 diabetes mellitus with other specified complication: Secondary | ICD-10-CM | POA: Diagnosis not present

## 2020-04-25 DIAGNOSIS — G4700 Insomnia, unspecified: Secondary | ICD-10-CM

## 2020-04-25 MED ORDER — LISINOPRIL-HYDROCHLOROTHIAZIDE 20-12.5 MG PO TABS: 2.0000 | ORAL_TABLET | Freq: Every day | ORAL | 3 refills | Status: DC

## 2020-04-25 MED ORDER — ATORVASTATIN CALCIUM 20 MG PO TABS: 20.0000 mg | ORAL_TABLET | Freq: Every day | ORAL | 3 refills | Status: DC

## 2020-04-25 MED ORDER — BYDUREON 2 MG ~~LOC~~ PEN: 2.0000 mg | PEN_INJECTOR | SUBCUTANEOUS | 2 refills | Status: DC

## 2020-04-25 NOTE — Patient Instructions (Signed)
Please obtain the x-ray order today

## 2020-04-26 LAB — URINALYSIS, COMPLETE
Bacteria, UA: NONE SEEN /HPF
Bilirubin Urine: NEGATIVE
Glucose, UA: NEGATIVE
Hyaline Cast: NONE SEEN /LPF
Ketones, ur: NEGATIVE
Leukocytes,Ua: NEGATIVE
Nitrite: NEGATIVE
Protein, ur: NEGATIVE
Specific Gravity, Urine: 1.021 (ref 1.001–1.03)
WBC, UA: NONE SEEN /HPF (ref 0–5)
pH: 5.5 (ref 5.0–8.0)

## 2020-04-26 LAB — COMPLETE METABOLIC PANEL WITH GFR
AG Ratio: 1.3 (calc) (ref 1.0–2.5)
ALT: 16 U/L (ref 6–29)
AST: 16 U/L (ref 10–35)
Albumin: 3.9 g/dL (ref 3.6–5.1)
Alkaline phosphatase (APISO): 79 U/L (ref 37–153)
BUN: 7 mg/dL (ref 7–25)
CO2: 32 mmol/L (ref 20–32)
Calcium: 9.5 mg/dL (ref 8.6–10.4)
Chloride: 101 mmol/L (ref 98–110)
Creat: 1 mg/dL (ref 0.50–1.05)
GFR, Est African American: 75 mL/min/{1.73_m2} (ref >=60)
GFR, Est Non African American: 65 mL/min/{1.73_m2} (ref >=60)
Globulin: 3 g/dL (calc) (ref 1.9–3.7)
Glucose, Bld: 89 mg/dL (ref 65–99)
Potassium: 3.9 mmol/L (ref 3.5–5.3)
Sodium: 143 mmol/L (ref 135–146)
Total Bilirubin: 0.3 mg/dL (ref 0.2–1.2)
Total Protein: 6.9 g/dL (ref 6.1–8.1)

## 2020-04-26 LAB — HEMOGLOBIN A1C
Hgb A1c MFr Bld: 10 % of total Hgb — ABNORMAL HIGH (ref <5.7)
Mean Plasma Glucose: 240 (calc)
eAG (mmol/L): 13.3 (calc)

## 2020-04-29 ENCOUNTER — Encounter: Payer: Self-pay | Admitting: Internal Medicine

## 2020-05-06 ENCOUNTER — Encounter (INDEPENDENT_AMBULATORY_CARE_PROVIDER_SITE_OTHER): Payer: Self-pay

## 2020-05-08 ENCOUNTER — Encounter (INDEPENDENT_AMBULATORY_CARE_PROVIDER_SITE_OTHER): Payer: Self-pay

## 2020-05-19 IMAGING — US US EXTREM LOW VENOUS*L*
1 series · 13 of 24 positions shown · non-contrast
Comparison: Prior bilateral lower extremity venous duplex
ultrasound on 03/30/2018

CLINICAL DATA: Left lower extremity edema and calf pain.



[Series 1: us extrem low venous*left* · 13 of 34 slices shown]
[im 1/34]
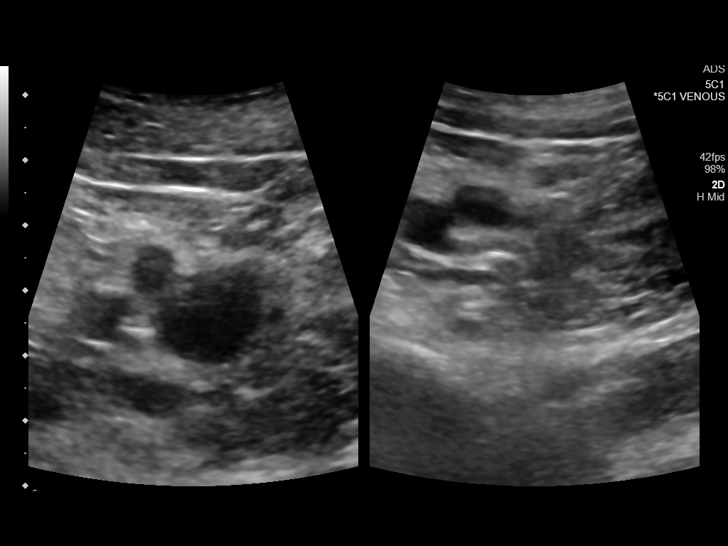
[im 3/34]
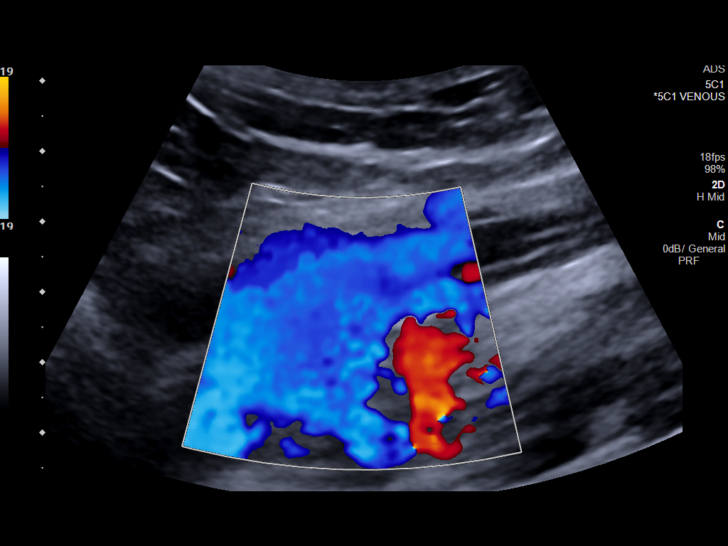
[im 6/34]
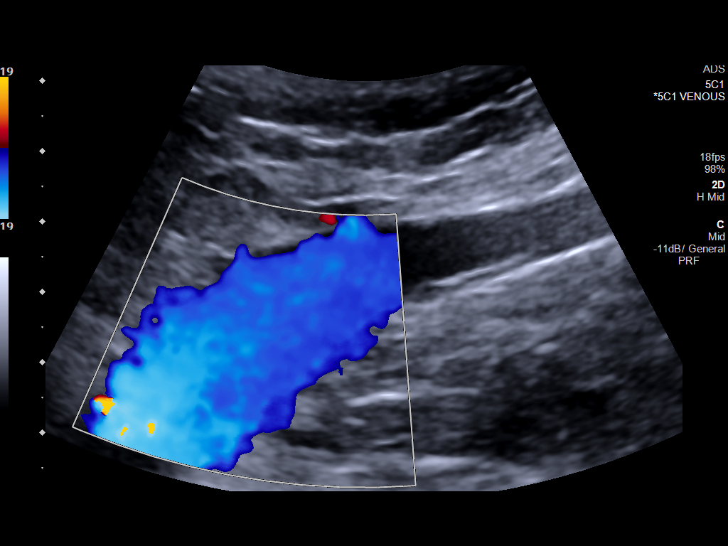
[im 9/34]
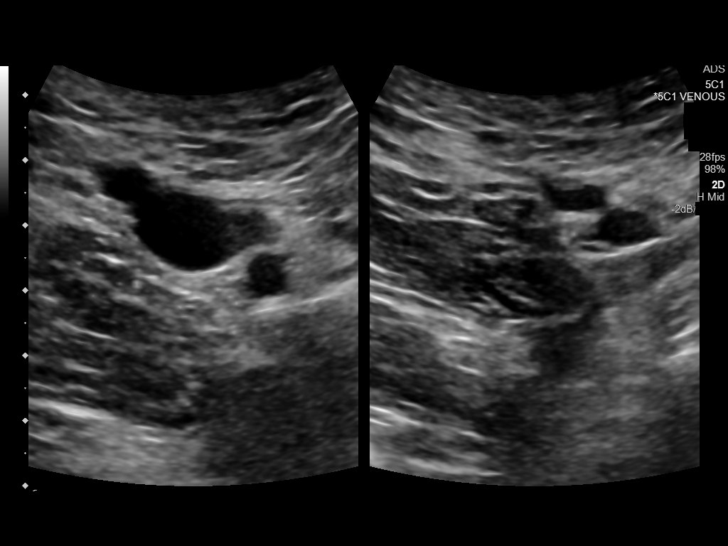
[im 12/34]
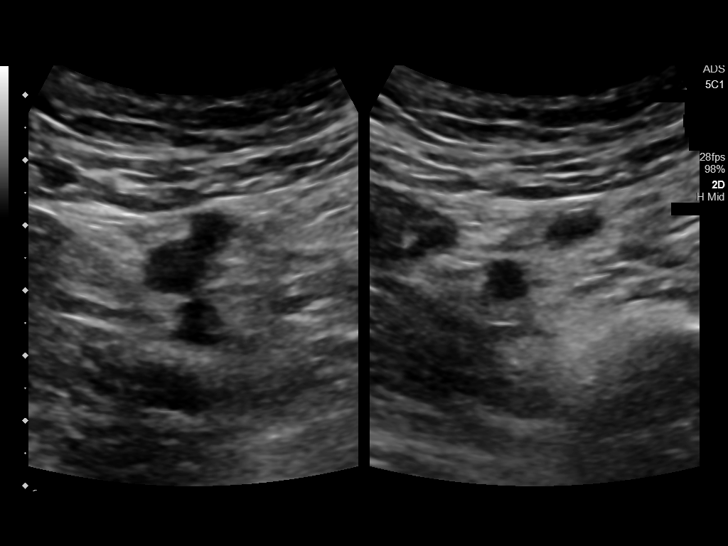
[im 15/34]
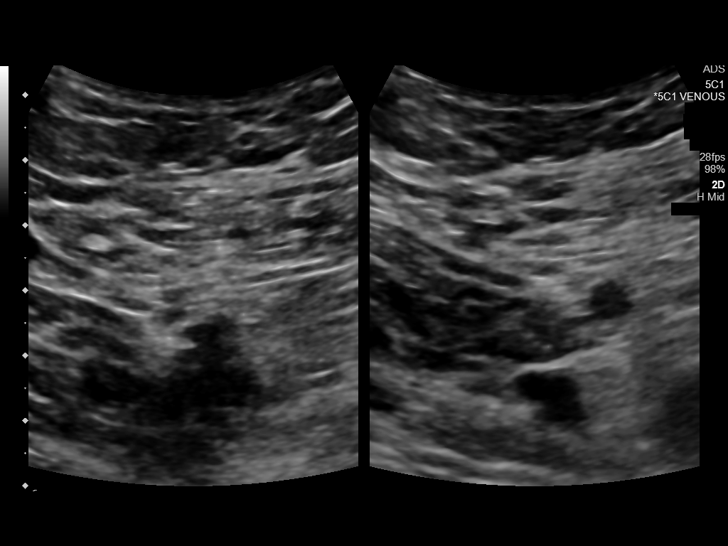
[im 18/34]
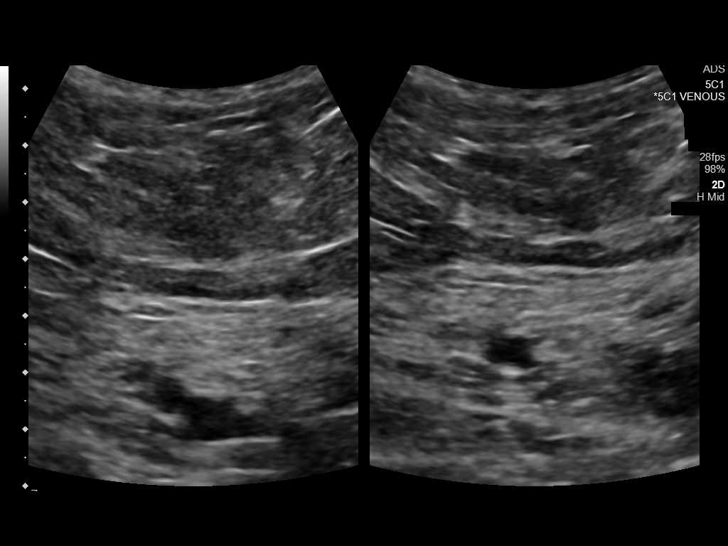
[im 19/34]
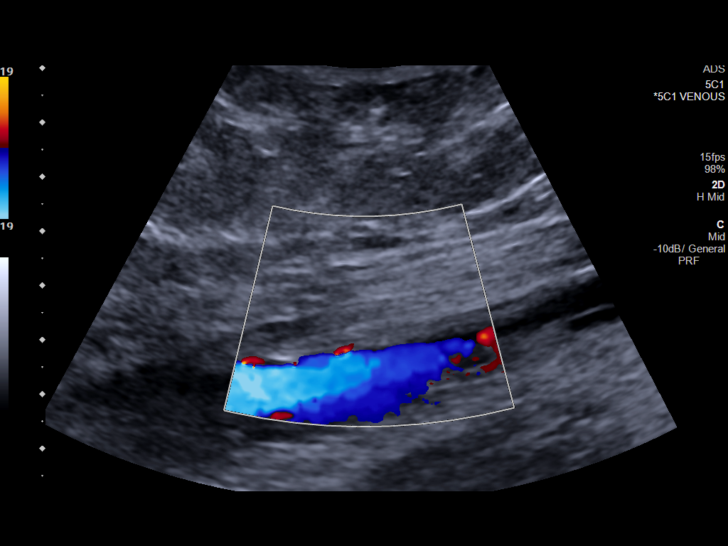
[im 22/34]
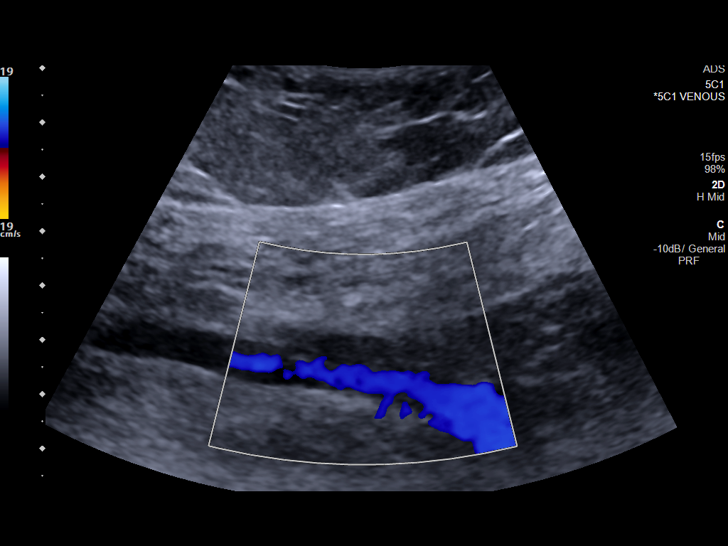
[im 25/34]
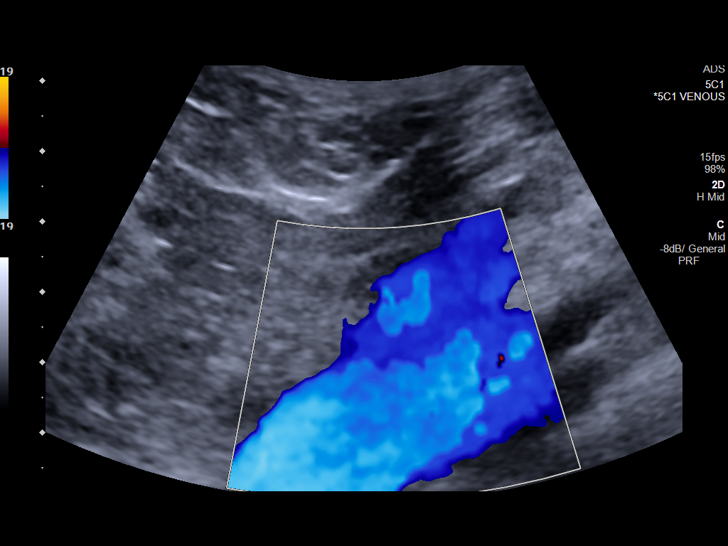
[im 28/34]
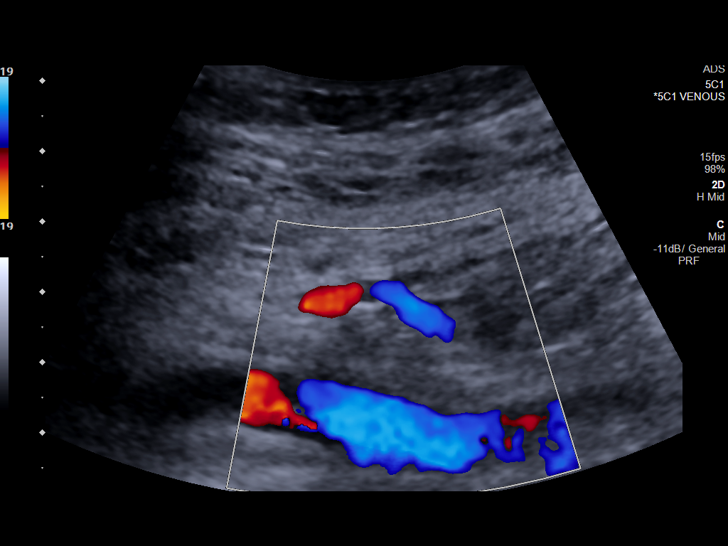
[im 31/34]
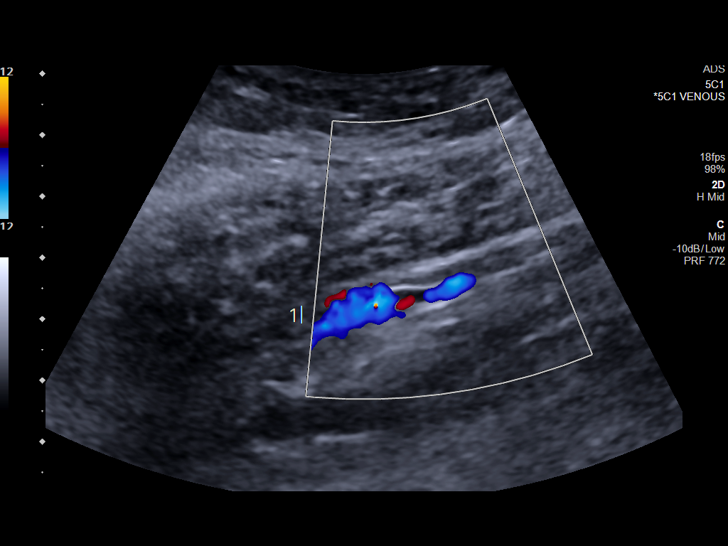
[im 34/34]
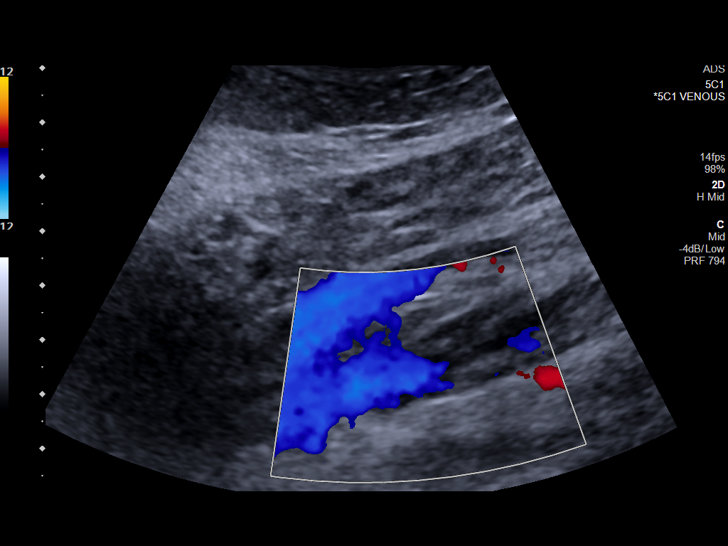

[13 of 24 positions shown; findings below may reference images not displayed]

FINDINGS: Contralateral Common Femoral Vein: Respiratory phasicity is normal
and symmetric with the symptomatic side. No evidence of thrombus.
Normal compressibility.

Common Femoral Vein: No evidence of thrombus. Normal
compressibility, respiratory phasicity and response to augmentation.

Saphenofemoral Junction: No evidence of thrombus. Normal
compressibility and flow on color Doppler imaging.

Profunda Femoral Vein: No evidence of thrombus. Normal
compressibility and flow on color Doppler imaging.

Femoral Vein: No evidence of thrombus. Normal compressibility,
respiratory phasicity and response to augmentation.

Popliteal Vein: No evidence of thrombus. Normal compressibility,
respiratory phasicity and response to augmentation.

Calf Veins: No evidence of thrombus. Normal compressibility and flow
on color Doppler imaging.

Superficial Great Saphenous Vein: No evidence of thrombus. Normal
compressibility.

Venous Reflux:  None.

Other Findings: No evidence of superficial thrombophlebitis or
abnormal fluid collection.
IMPRESSION: No evidence of left lower extremity deep venous thrombosis.

## 2020-05-26 IMAGING — US US PELVIS COMPLETE WITH TRANSVAGINAL
1 series · 13 of 25 positions shown · non-contrast
Comparison: CT scan of July 31, 2019. Ultrasound July 23, 2019.

CLINICAL DATA: Vaginal bleeding.

EXAM:
TRANSABDOMINAL AND TRANSVAGINAL ULTRASOUND OF PELVIS
TECHNIQUE: Both transabdominal and transvaginal ultrasound examinations of the
pelvis were performed. Transabdominal technique was performed for
global imaging of the pelvis including uterus, ovaries, adnexal
regions, and pelvic cul-de-sac. It was necessary to proceed with
endovaginal exam following the transabdominal exam to visualize the
endometrium and adnexal regions.

[Series 1: us pelvis complete with transvaginal · 13 of 121 slices shown]
[im 1/121]
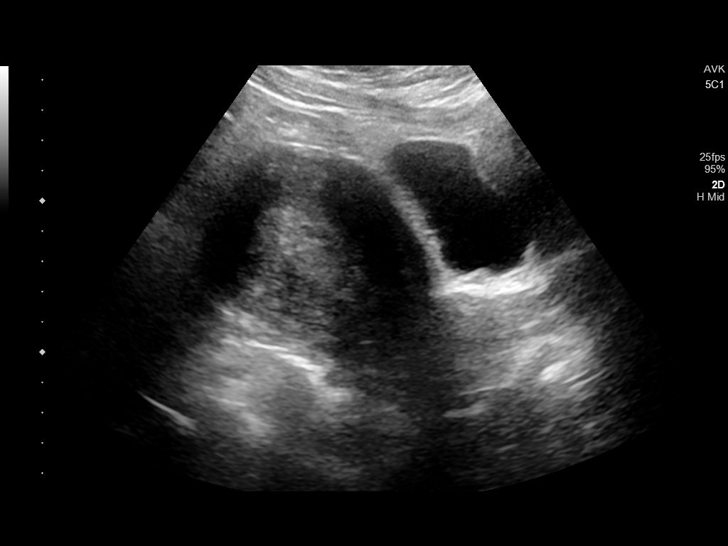
[im 11/121]
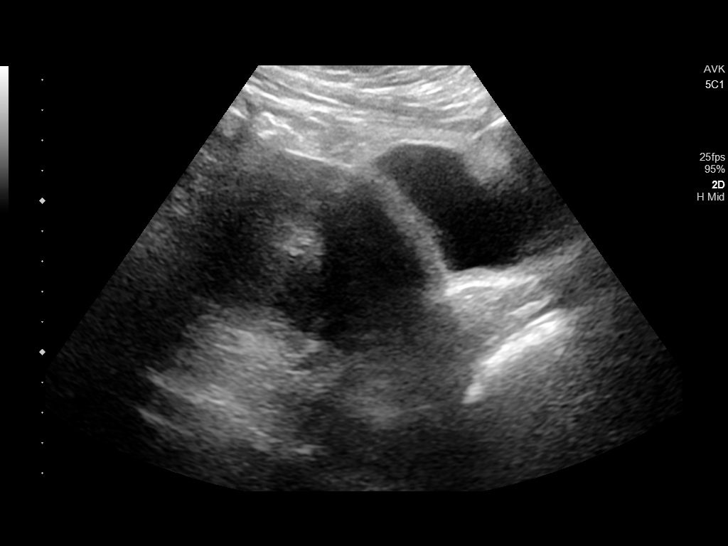
[im 21/121]
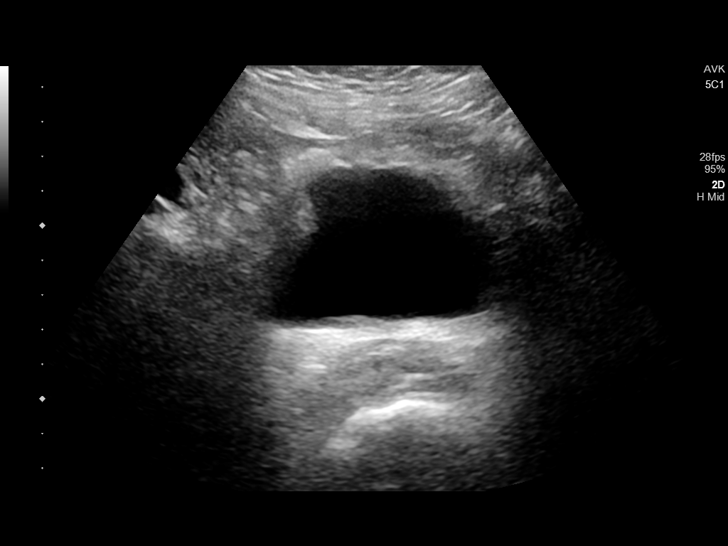
[im 31/121]
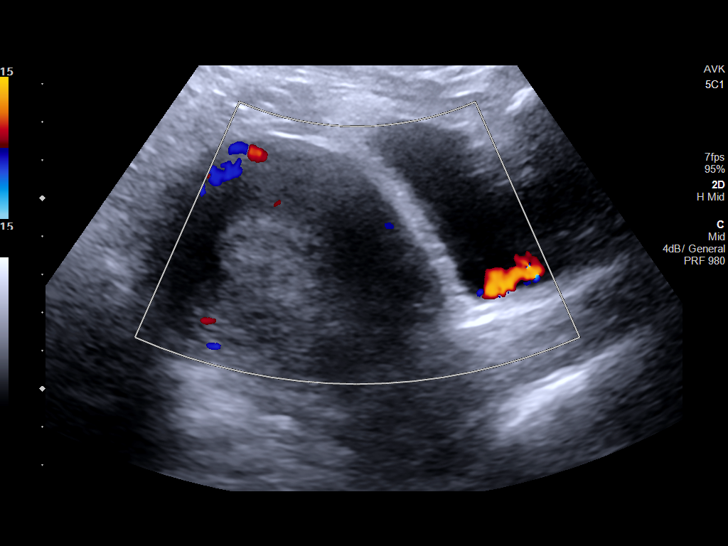
[im 41/121]
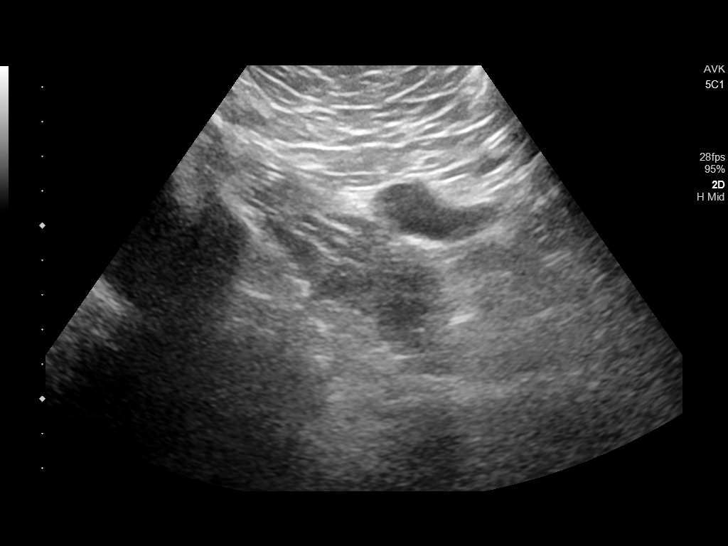
[im 51/121]
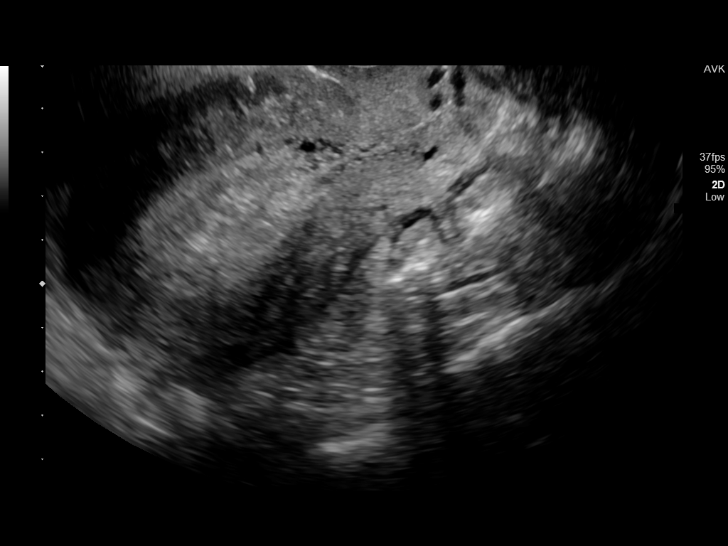
[im 61/121]
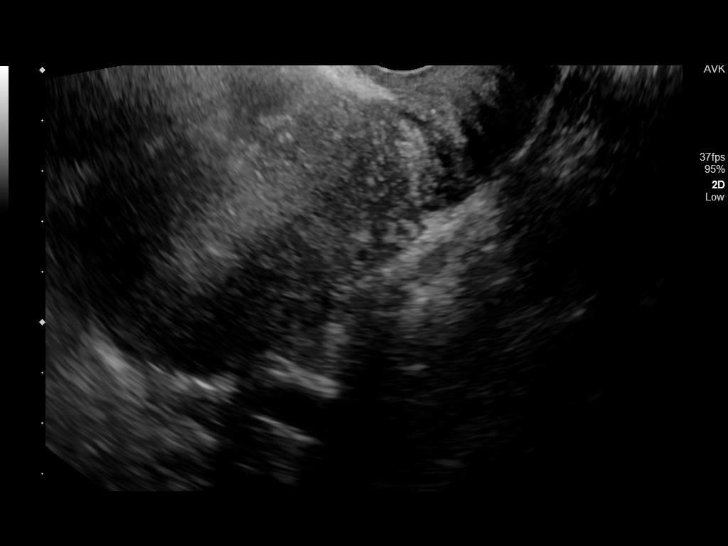
[im 71/121]
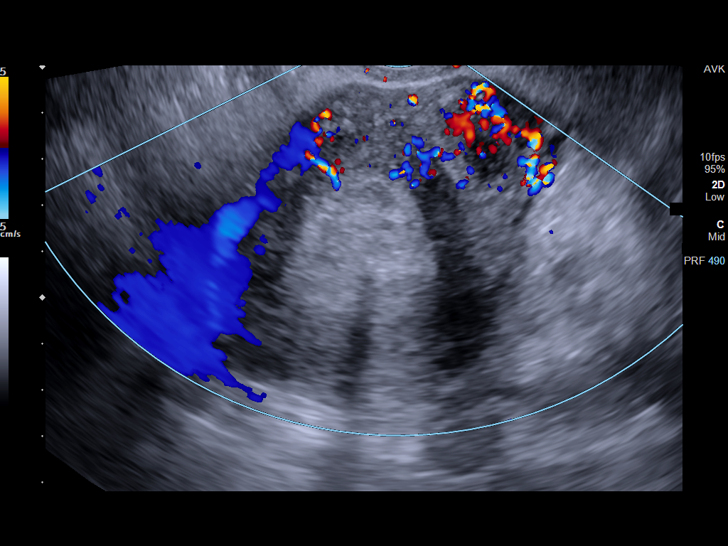
[im 81/121]
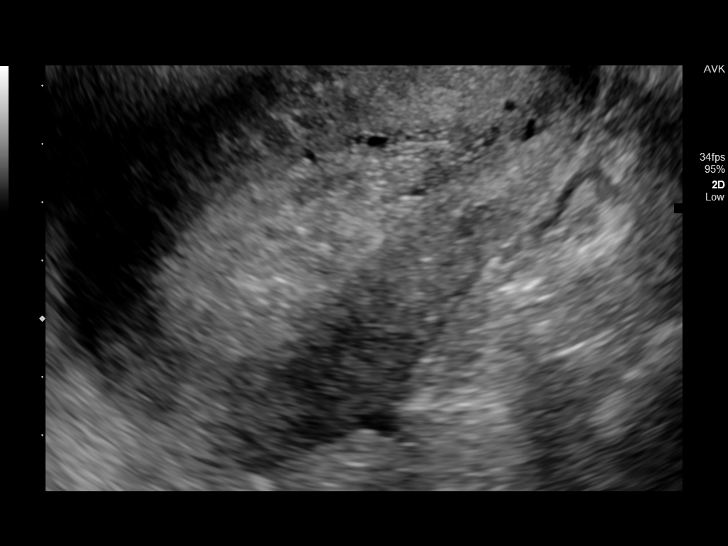
[im 91/121]
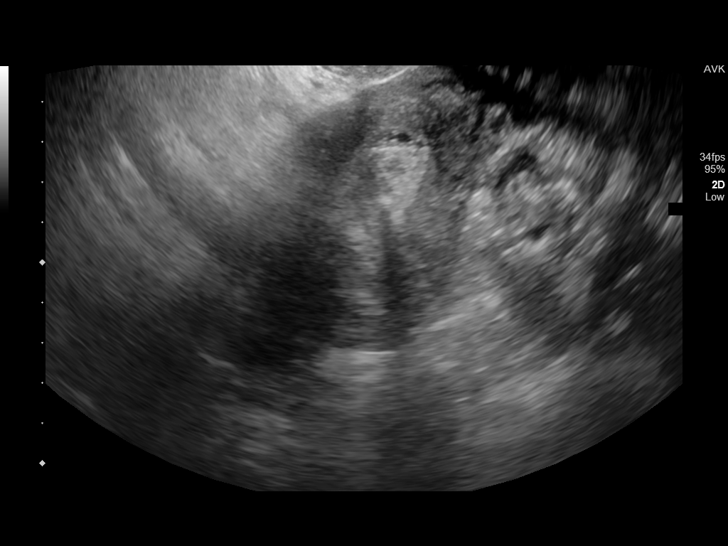
[im 101/121]
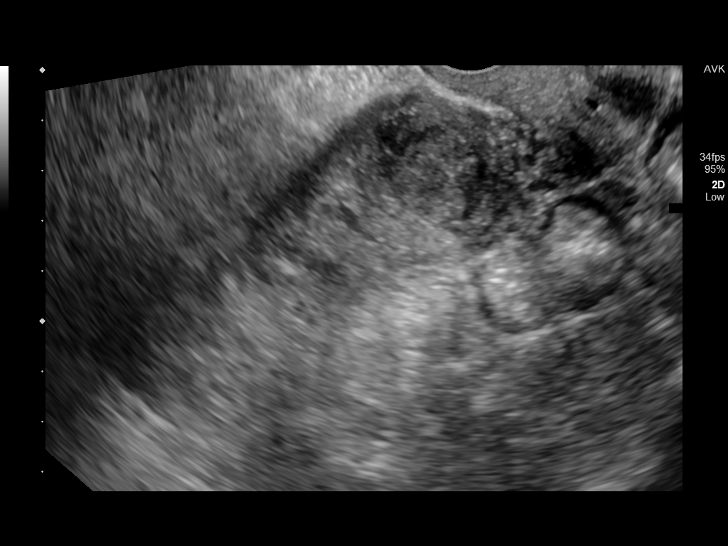
[im 111/121]
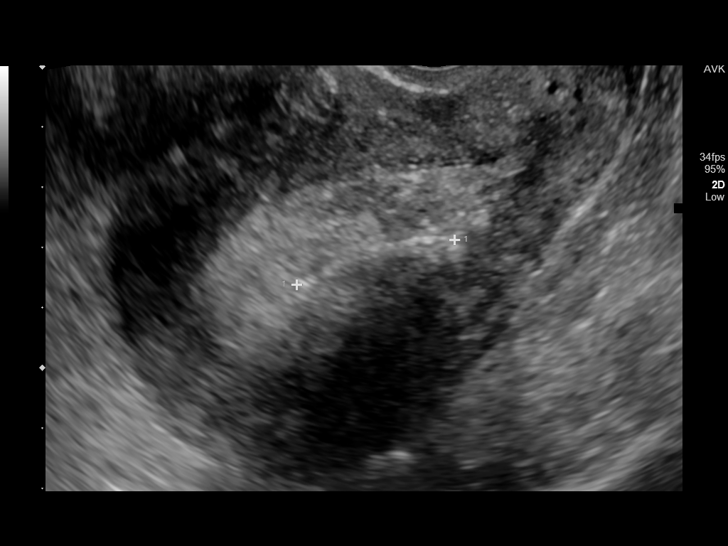
[im 121/121]
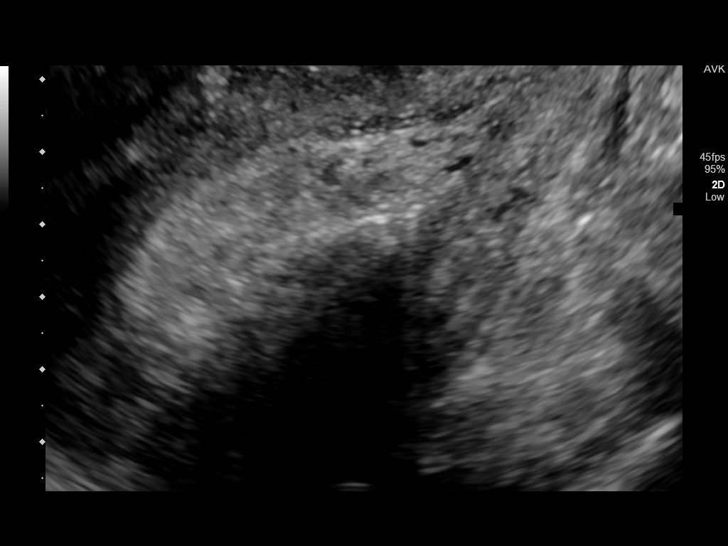

[13 of 25 positions shown; findings below may reference images not displayed]

FINDINGS: Uterus

Measurements: 11.5 x 6.5 x 6.3 cm = volume: 244 mL. 2.9 cm probable
fibroid is seen anteriorly and on left side of uterus.

Endometrium

Thickness: 2.4 cm which is abnormally thickened. Intrauterine device
is noted in endometrial canal.

Right ovary

Not visualized.

Left ovary

Not visualized.

Other findings

No abnormal free fluid.
IMPRESSION: If bleeding remains unresponsive to hormonal or medical therapy,
focal lesion work-up with sonohysterogram should be considered.
Endometrial biopsy should also be considered in pre-menopausal
patients at high risk for endometrial carcinoma. (Ref: Radiological
Reasoning: Algorithmic Workup of Abnormal Vaginal Bleeding with
Endovaginal Sonography and Sonohysterography. AJR [id number]; 191:[id number]).

2.9 cm uterine fibroid.

Ovaries are not visualized.

## 2020-06-06 ENCOUNTER — Other Ambulatory Visit: Payer: Self-pay | Admitting: Neurosurgery

## 2020-06-06 DIAGNOSIS — G894 Chronic pain syndrome: Secondary | ICD-10-CM

## 2020-06-24 ENCOUNTER — Ambulatory Visit: Admission: RE | Admit: 2020-06-24 | Payer: Medicaid Other | Source: Ambulatory Visit

## 2020-06-27 ENCOUNTER — Other Ambulatory Visit (INDEPENDENT_AMBULATORY_CARE_PROVIDER_SITE_OTHER): Payer: Self-pay | Admitting: Nurse Practitioner

## 2020-06-27 DIAGNOSIS — M7989 Other specified soft tissue disorders: Secondary | ICD-10-CM

## 2020-06-30 ENCOUNTER — Ambulatory Visit (INDEPENDENT_AMBULATORY_CARE_PROVIDER_SITE_OTHER): Payer: Medicaid Other | Admitting: Vascular Surgery

## 2020-06-30 ENCOUNTER — Encounter (INDEPENDENT_AMBULATORY_CARE_PROVIDER_SITE_OTHER): Payer: Medicaid Other

## 2020-07-25 IMAGING — MR MR LUMBAR SPINE W/O CM
5 series · 31 of 48 positions shown · non-contrast
Comparison: None available.

CLINICAL DATA: Initial evaluation for chronic low back pain with
left leg pain, getting worse.

EXAM:
MRI LUMBAR SPINE WITHOUT CONTRAST
TECHNIQUE: Multiplanar, multisequence MR imaging of the lumbar spine was
performed. No intravenous contrast was administered.

[Series 9: T2 · sagittal · 4.0mm · 0.88mm/px · 7 of 15 slices shown (1 of 2)]
[im 1/15]
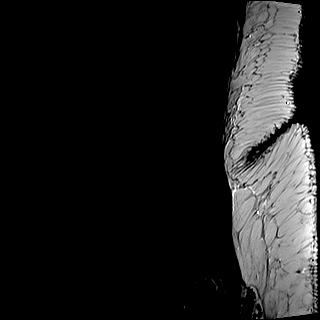
[im 3/15]
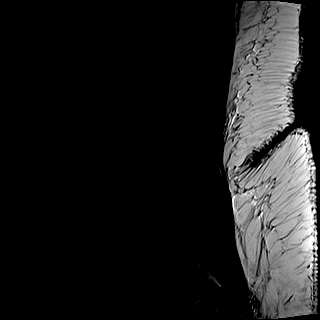
[im 5/15]
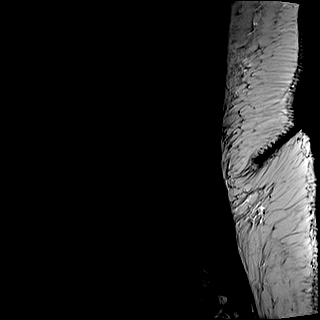
[im 8/15]
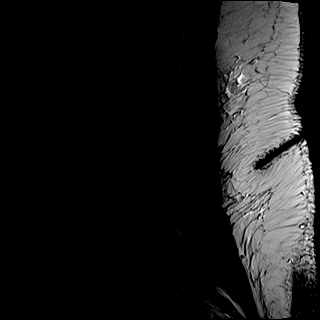
[im 10/15]
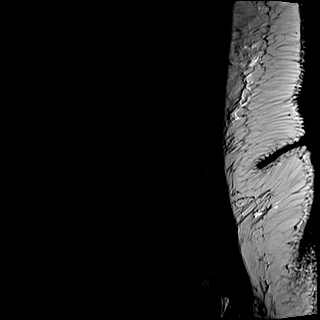
[im 12/15]
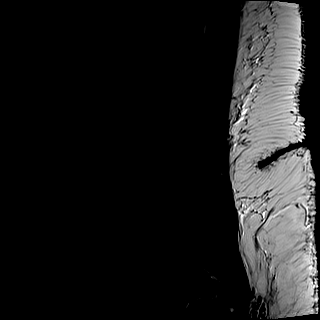
[im 15/15]
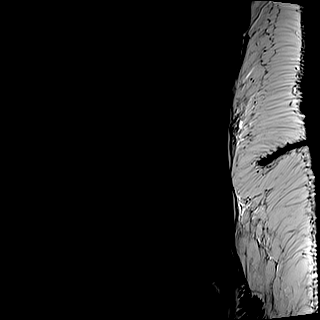

[Series 10: T1 · sagittal · 4.0mm · 0.88mm/px · 7 of 15 slices shown (1 of 2)]
[im 1/15]
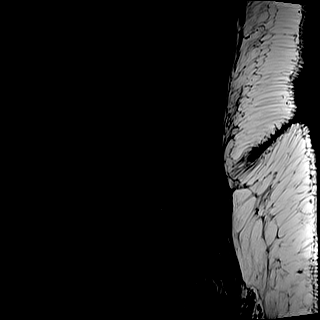
[im 3/15]
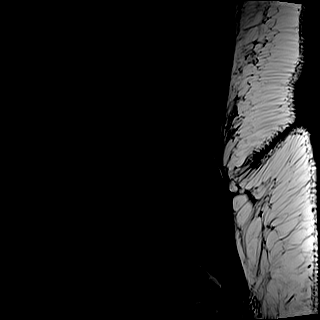
[im 5/15]
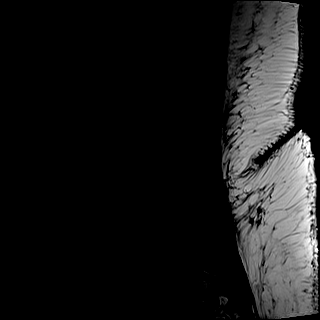
[im 8/15]
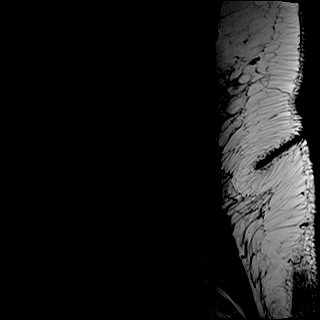
[im 10/15]
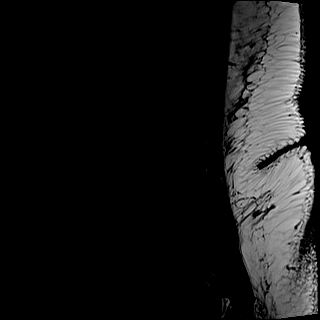
[im 12/15]
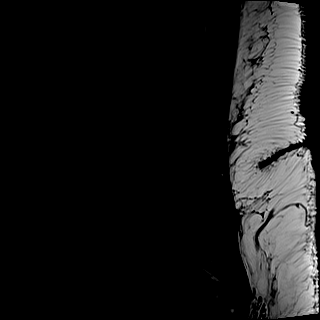
[im 15/15]
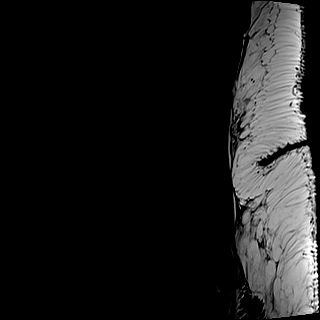

[Series 11: STIR · sagittal · 4.0mm · 0.44mm/px · 1 of 15 slices shown]
[im 1/15]
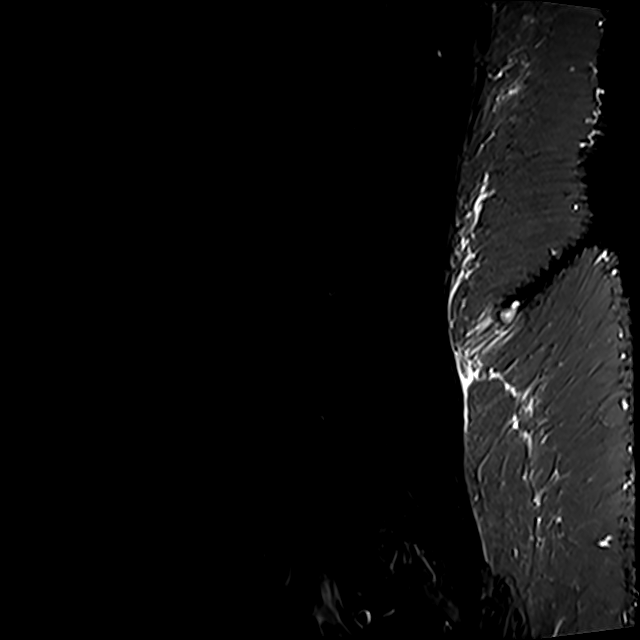

[Series 12: T2 · axial · 4.0mm · 0.78mm/px · z∈[+12,+210]mm · 8 of 34 slices shown (2 of 2)]
[im 1/34]
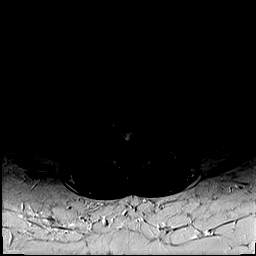
[im 6/34]
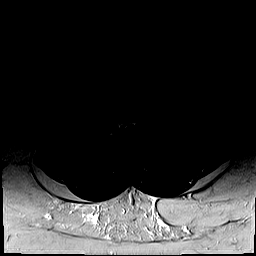
[im 11/34]
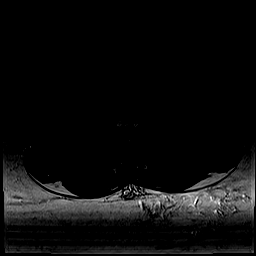
[im 16/34]
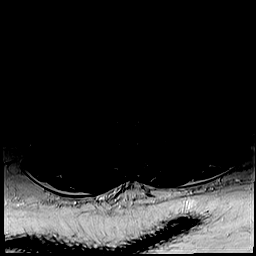
[im 18/34]
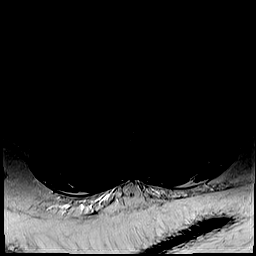
[im 23/34]
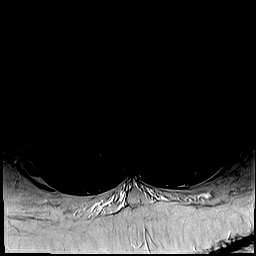
[im 28/34]
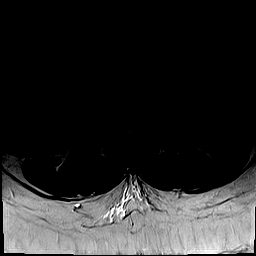
[im 34/34]
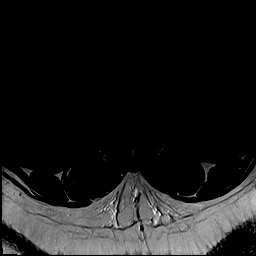

[Series 13: T1 · axial · 4.0mm · 0.39mm/px · z∈[+12,+210]mm · 8 of 34 slices shown (2 of 2)]
[im 1/34]
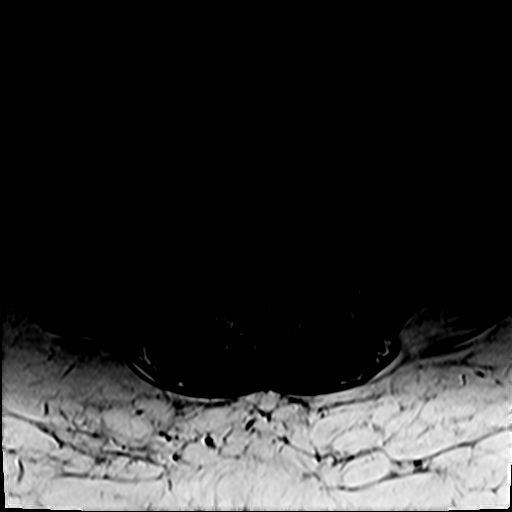
[im 6/34]
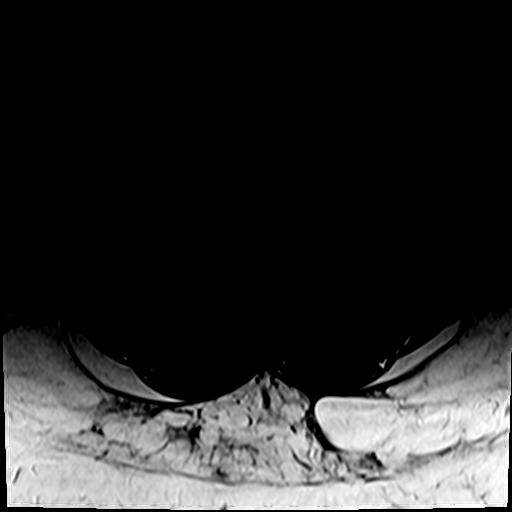
[im 11/34]
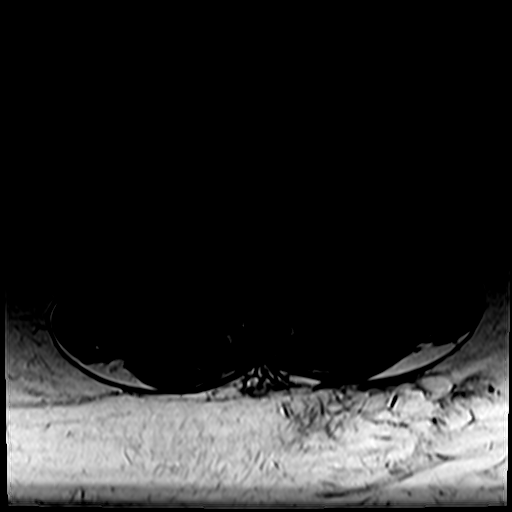
[im 16/34]
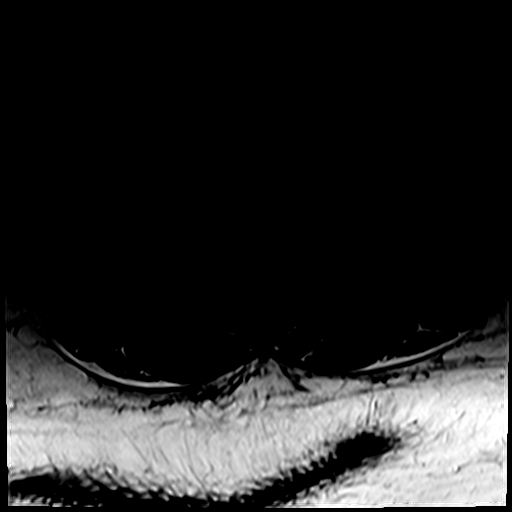
[im 18/34]
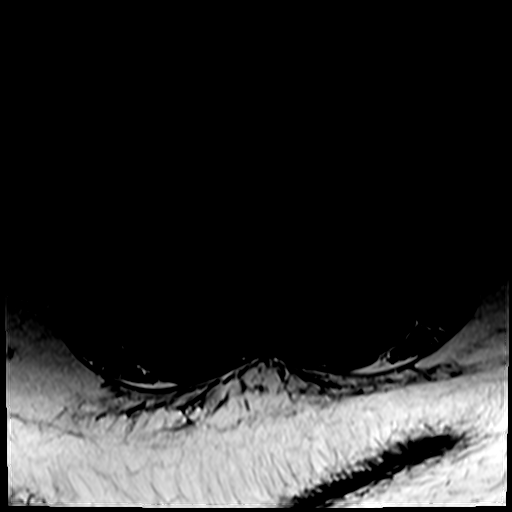
[im 23/34]
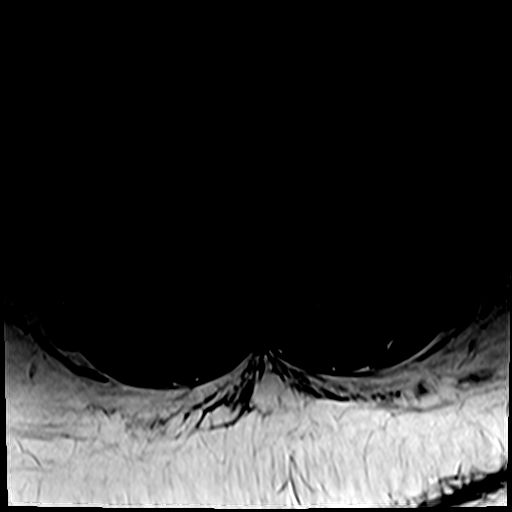
[im 28/34]
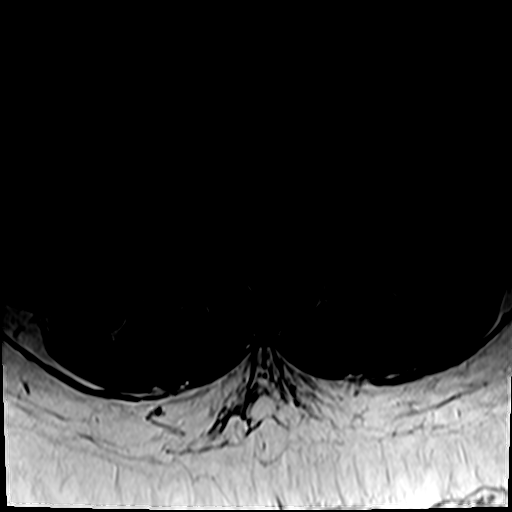
[im 34/34]
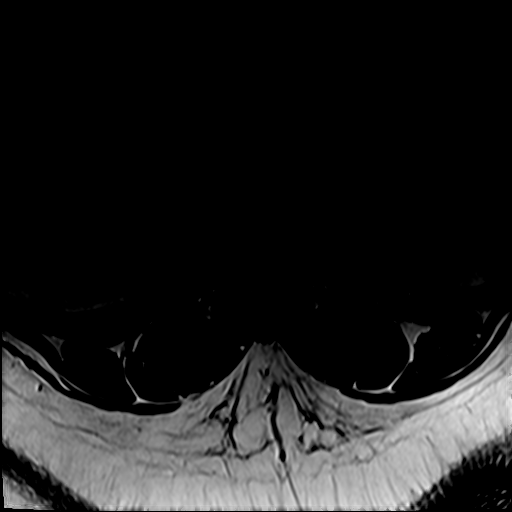

[31 of 48 positions shown; findings below may reference images not displayed]

FINDINGS: Segmentation: Standard. Lowest well-formed disc space labeled the
L5-S1 level.

Alignment: Physiologic with preservation of the normal lumbar
lordosis. No listhesis.

Vertebrae: Vertebral body height maintained without evidence for
acute or chronic fracture. Bone marrow signal intensity within
normal limits. Small benign hemangioma noted within the L3 vertebral
body. No other discrete or worrisome osseous lesions. No abnormal
marrow edema.

Conus medullaris and cauda equina: Conus extends to the L1 level.
Conus and cauda equina appear normal.

Paraspinal and other soft tissues: Paraspinous soft tissues within
normal limits. Visualized visceral structures grossly unremarkable.

Disc levels:

L1-2:  Unremarkable.

L2-3:  Unremarkable.

L3-4:  Unremarkable.

L4-5: Mild diffuse disc bulge with disc desiccation. Moderate facet
and ligament flavum hypertrophy. Associated small bilateral joint
effusions. Resultant moderate canal with bilateral subarticular
stenosis, worse on the left. Mild bilateral L4 foraminal narrowing,
also worse on the left.

L5-S1: Mild disc bulge, slightly asymmetric to the left. Mild
bilateral facet hypertrophy. Resultant mild left lateral recess
stenosis. Mild left L5 foraminal narrowing. No significant right
foraminal encroachment.
IMPRESSION: 1. Disc bulge with facet hypertrophy at L4-5 with resultant moderate
canal and bilateral subarticular stenosis, left worse than right.
2. Mild left lateral recess and foraminal stenosis at L5-S1 related
to disc bulge and facet disease.

## 2020-08-14 LAB — HM DIABETES EYE EXAM

## 2020-10-10 ENCOUNTER — Other Ambulatory Visit: Payer: Self-pay | Admitting: Student

## 2020-10-10 ENCOUNTER — Other Ambulatory Visit (HOSPITAL_COMMUNITY): Payer: Self-pay | Admitting: Student

## 2020-10-10 DIAGNOSIS — M5136 Other intervertebral disc degeneration, lumbar region: Secondary | ICD-10-CM

## 2020-10-10 DIAGNOSIS — M5442 Lumbago with sciatica, left side: Secondary | ICD-10-CM

## 2020-10-10 DIAGNOSIS — M5416 Radiculopathy, lumbar region: Secondary | ICD-10-CM

## 2020-10-10 DIAGNOSIS — G8929 Other chronic pain: Secondary | ICD-10-CM

## 2020-10-28 ENCOUNTER — Other Ambulatory Visit: Payer: Self-pay

## 2020-10-28 ENCOUNTER — Ambulatory Visit
Admission: RE | Admit: 2020-10-28 | Discharge: 2020-10-28 | Disposition: A | Discharge status: Home / Self Care | Payer: Medicaid Other | Source: Ambulatory Visit | Attending: Student | Admitting: Student

## 2020-10-28 DIAGNOSIS — M5441 Lumbago with sciatica, right side: Secondary | ICD-10-CM | POA: Insufficient documentation

## 2020-10-28 DIAGNOSIS — M5136 Other intervertebral disc degeneration, lumbar region: Secondary | ICD-10-CM | POA: Diagnosis present

## 2020-10-28 DIAGNOSIS — G8929 Other chronic pain: Secondary | ICD-10-CM | POA: Diagnosis present

## 2020-10-28 DIAGNOSIS — M5416 Radiculopathy, lumbar region: Secondary | ICD-10-CM | POA: Diagnosis present

## 2020-10-28 DIAGNOSIS — M5442 Lumbago with sciatica, left side: Secondary | ICD-10-CM | POA: Insufficient documentation

## 2020-11-10 ENCOUNTER — Ambulatory Visit (INDEPENDENT_AMBULATORY_CARE_PROVIDER_SITE_OTHER): Payer: Medicaid Other | Admitting: Certified Nurse Midwife

## 2020-11-10 ENCOUNTER — Encounter: Payer: Self-pay | Admitting: Certified Nurse Midwife

## 2020-11-10 ENCOUNTER — Other Ambulatory Visit: Payer: Self-pay

## 2020-11-10 VITALS — BP 174/94 | HR 71 | Ht 66.0 in | Wt 320.4 lb

## 2020-11-10 DIAGNOSIS — N939 Abnormal uterine and vaginal bleeding, unspecified: Secondary | ICD-10-CM

## 2020-11-10 NOTE — Patient Instructions (Signed)

## 2020-11-10 NOTE — Progress Notes (Signed)
GYN ANNUAL PREVENTATIVE CARE ENCOUNTER NOTE  Subjective:       Brenda Hicks is a 54 y.o. [phone] female here for a routine annual gynecologic exam.  Current complaints: pt continues to have abnormal uterine bleeding. She describes it as heavy and painful with sharp lower abdominal pain and lower back pain . At is worst it is 10/10. She has sued aleve to help with pain. She started approximately 4 weeks ago and it stopped on Friday. She has used a IUD in the past that came out with clots.     Gynecologic History No LMP recorded. Contraception: none Last Pap: 01/29/19. Results were: normal Last mammogram: unknown , ordered   Obstetric History OB History  Gravida Para Term Preterm AB Living  _0 SAB IAB Ectopic Multiple Live Births      1   6    # Outcome Date GA Lbr Len/2nd Weight Sex Delivery Anes PTL Lv  7 Ectopic 1991          6 Term 11/11/89   8 lb 4 oz (3.742 kg) M Vag-Spont  N LIV  5 Term 03/10/88   7 lb 2 oz (3.232 kg) M Vag-Spont  N LIV  4 Term 02/15/87   6 lb (2.722 kg) M Vag-Spont  N LIV  3 Term 01/02/86   5 lb 10 oz (2.551 kg)  Vag-Spont  N LIV     Complications: Dysfunctional Labor  2 Preterm 12/31/84   4 lb 11 oz (2.126 kg) M Vag-Spont  Y LIV  1 Term 03/05/84   6 lb 7 oz (2.92 kg) M Vag-Spont  N LIV    Past Medical History:  Diagnosis Date  . Anemia    vitamin d deficiency  . Arthritis   . Asthma    WELL CONTROLLED  . 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 Diabetes mellitus without complication ([place])   . Fatty liver   . Hypertension   . Kidney cysts    per patient, never had  . Renal disorder   . Sleep apnea    USES CPAP. waiting for new machine and a new sleep study  . Stroke [facility]) May or June 2019   TIA. no residual symptoms    Past Surgical History:  Procedure Laterality Date  . ANTERIOR CERVICAL DECOMP/DISCECTOMY FUSION N/A 08/09/2018   Procedure: ANTERIOR CERVICAL DECOMPRESSION/DISCECTOMY FUSION 1 LEVEL- C4-5;  Surgeon: Meade Maw, MD;  Location: [facility] ORS;  Service: Neurosurgery;  Laterality: N/A;  . BACK SURGERY    . DILATION AND CURETTAGE OF UTERUS    . ENDOMETRIAL BIOPSY     benign  . EXPLORATORY LAPAROTOMY  1992   REMOVAL OF RUPTURED ECTOPIC  . HAND SURGERY Right 1998   cyst removed  . HERNIA REPAIR  8250   UMBILICAL  . JOINT REPLACEMENT Right 2014   TKR  . KNEE ARTHROSCOPY Right 2012  . KNEE SURGERY Right 2014   total knee replacement  . SHOULDER ARTHROSCOPY WITH BICEPSTENOTOMY Left 12/14/2016   Procedure: SHOULDER ARTHROSCOPY WITH BICEPSTENOTOMY;  Surgeon: Corky Mull, MD;  Location: [facility] ORS;  Service: Orthopedics;  Laterality: Left;  . SHOULDER ARTHROSCOPY WITH OPEN ROTATOR CUFF REPAIR Left 12/14/2016   Procedure: SHOULDER ARTHROSCOPY WITH OPEN ROTATOR CUFF REPAIR AND ARTHROSCOPIC ROTATOR CUFF REPAIR;  Surgeon: Corky Mull, MD;  Location: [facility] ORS;  Service: Orthopedics;  Laterality: Left;  . SHOULDER ARTHROSCOPY WITH ROTATOR CUFF REPAIR  Right 01/04/2019   Procedure: SHOULDER ARTHROSCOPY WITH ROTATOR CUFF REPAIR;  Surgeon: Corky Mull, MD;  Location: [facility] ORS;  Service: Orthopedics;  Laterality: Right;  . SHOULDER ARTHROSCOPY WITH SUBACROMIAL DECOMPRESSION Left 12/14/2016   Procedure: SHOULDER ARTHROSCOPY WITH SUBACROMIAL DECOMPRESSION;  Surgeon: Corky Mull, MD;  Location: [facility] ORS;  Service: Orthopedics;  Laterality: Left;  . SHOULDER ARTHROSCOPY WITH SUBACROMIAL DECOMPRESSION AND BICEP TENDON REPAIR Right 01/04/2019   Procedure: SHOULDER ARTHROSCOPY WITH DEBRIDEMENT AND SUBACROMIAL DECOMPRESSION-RIGHT;  Surgeon: Corky Mull, MD;  Location: [facility] ORS;  Service: Orthopedics;  Laterality: Right;  . TUBAL LIGATION      Current Outpatient Medications on File Prior to Visit  Medication Sig Dispense Refill  . Accu-Chek FastClix Lancets MISC TEST TID (Patient not taking: Reported on 11/10/2020)    . ACCU-CHEK GUIDE test strip U TID UTD (Patient not taking: Reported on 11/10/2020)    . albuterol  (VENTOLIN HFA) 108 (90 Base) MCG/ACT inhaler Inhale 2 puffs into the lungs every 6 (six) hours as needed for wheezing or shortness of breath. (Patient not taking: Reported on 11/10/2020) 18 g 3  . atorvastatin (LIPITOR) 20 MG tablet Take 1 tablet (20 mg total) by mouth daily. (Patient not taking: Reported on 11/10/2020) 90 tablet 3  . Blood Glucose Monitoring Suppl (ACCU-CHEK GUIDE) w/Device KIT U UTD TID FOR FINGERSTICK TESTING (Patient not taking: Reported on 11/10/2020)    . cyclobenzaprine (FLEXERIL) 5 MG tablet Take 1 tablet (5 mg total) by mouth 3 (three) times daily as needed. (Patient not taking: Reported on 11/10/2020) 15 tablet 0  . Exenatide ER (BYDUREON) 2 MG PEN Inject 2 mg into the skin every Sunday. (Patient not taking: Reported on 11/10/2020) 4 each 2  . insulin glargine (LANTUS) 100 UNIT/ML injection Inject 1 mL (100 Units total) into the skin daily. (Patient not taking: Reported on 11/10/2020) 10 mL 11  . insulin lispro (HUMALOG) 100 UNIT/ML injection Inject 30 Units into the skin 3 (three) times daily before meals. (Patient not taking: Reported on 11/10/2020)    . Insulin Syringe-Needle U-100 (INSULIN SYRINGE 1CC/31GX5/16") 31G X 5/16" 1 ML MISC USE 1 QID (Patient not taking: Reported on 11/10/2020)    . ketorolac (TORADOL) 10 MG tablet Take 1 tablet (10 mg total) by mouth every 8 (eight) hours. (Patient not taking: No sig reported) 15 tablet 0  . lisinopril-hydrochlorothiazide (ZESTORETIC) 20-12.5 MG tablet Take 2 tablets by mouth daily. (Patient not taking: Reported on 11/10/2020) 90 tablet 3  . naproxen sodium (ALEVE) 220 MG tablet Take 220 mg by mouth daily as needed. (Patient not taking: Reported on 11/10/2020)    . promethazine (PHENERGAN) 25 MG tablet Take 1 tablet (25 mg total) by mouth every 6 (six) hours as needed for nausea or vomiting. (Patient not taking: Reported on 11/10/2020) 20 tablet 0  . QUEtiapine (SEROQUEL) 50 MG tablet Take 50 mg by mouth at bedtime. (Patient not  taking: Reported on 11/10/2020)    . tiZANidine (ZANAFLEX) 4 MG tablet Take 1.5 tablets (6 mg total) by mouth 2 (two) times daily as needed for muscle spasms. (Patient not taking: Reported on 11/10/2020) 60 tablet 2  . traZODone (DESYREL) 50 MG tablet Take 50 mg by mouth at bedtime as needed for sleep.  (Patient not taking: Reported on 11/10/2020)  5  . venlafaxine (EFFEXOR) 37.5 MG tablet Take by mouth.    . Vitamin D, Ergocalciferol, (DRISDOL) 1.25 MG (50000 UT) CAPS capsule Take 50,000 Units by mouth every Monday. (Patient not  taking: Reported on 11/10/2020)     No current facility-administered medications on file prior to visit.    Allergies  Allergen Reactions  . Morphine And Related Nausea And Vomiting  . Tramadol     Nausea. If she takes antinausea medicine with this medicine, then she can tolerate it.    Social History   Socioeconomic History  . Marital status: Married    Spouse name: brian  . Number of children: 7  . Years of education: Not on file  . Highest education level: Not on file  Occupational History  . Occupation: disabled    Comment: disabled  Tobacco Use  . Smoking status: Former Smoker    Packs/day: 2.00    Years: 25.00    Pack years: 50.00    Types: Cigarettes    Quit date: 06/09/2017    Years since quitting: 3.4  . Smokeless tobacco: Never Used  Vaping Use  . Vaping Use: Never used  Substance and Sexual Activity  . Alcohol use: No  . Drug use: Yes    Types: Marijuana    Comment: Pt. uses marjiuana only when she has severe back and side pain  . Sexual activity: Not Currently    Partners: Male    Birth control/protection: I.U.D.    Comment: Mirena  Other Topics Concern  . Not on file  Social History Narrative   Have 6 biological childrean and custody of niece since 58 months.   Social Determinants of Health   Financial Resource Strain: Not on file  Food Insecurity: Not on file  Transportation Needs: Not on file  Physical Activity: Not on  file  Stress: Not on file  Social Connections: Not on file  Intimate Partner Violence: Not on file    Family History  Problem Relation Age of Onset  . Hypertension Mother   . CAD Maternal Grandmother   . Breast cancer Neg Hx   . Ovarian cancer Neg Hx   . Colon cancer Neg Hx     The following portions of the patient's history were reviewed and updated as appropriate: allergies, current medications, past family history, past medical history, past social history, past surgical history and problem list.  Review of Systems ROS   Objective:   BP (!) 174/94   Pulse 71   Ht _0  (1.676 m)   Wt (!) 320 lb 7 oz (145.3 kg)   BMI 51.72 kg/m  Physical Exam  Assessment:   Annual gynecologic examination 54 y.o. Contraception: none Obesity 2 Patient Active Problem List   Diagnosis Date Noted  . Frequent bowel movements 04/25/2020  . Abnormal Pap smear 03/31/2020  . Arthritis 03/31/2020  . Sprain of collateral ligament of right knee 03/10/2020  . Leg swelling 02/08/2020  . S/P cervical spinal fusion 09/24/2019  . Hyperlipidemia associated with type 2 diabetes mellitus ([place]) 08/08/2019  . Low back pain radiating to left lower extremity 08/07/2019  . Nontraumatic incomplete tear of right rotator cuff 01/04/2019  . Tendinitis of upper biceps tendon of right shoulder 01/04/2019  . Rotator cuff tendinitis, right 12/01/2018  . Cervical myelopathy ([place]) 08/09/2018  . DJD (degenerative joint disease) of cervical spine 08/04/2018  . 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 History of ischemic stroke 03/30/2018  . Ischemic chest pain ([place]) 03/29/2018  . Chest pain 03/29/2018  . Sebaceous cyst 06/01/2016  . Obstructive apnea 05/31/2016  . Acid reflux 05/31/2016  . Essential hypertension 05/31/2016  . Abnormal Pap smear of cervix 05/31/2016  .  Arthritis of knee, degenerative 07/25/2015  . History of artificial joint 07/25/2015  . Gonalgia 02/17/2015  . Type 2 diabetes  mellitus ([place]) 10/23/2014  . 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 Diabetic polyneuropathy associated with type 2 diabetes mellitus ([place]) 05/16/2014  . Endometrial polyp 11/01/2013  . Fibroids, intramural 10/09/2013  . Pain due to knee joint prosthesis ([place]) 10/05/2013  . Body mass index (BMI) of 50-59.9 in adult ([place]) 10/03/2013  . Abnormal uterine bleeding 10/03/2013  . Morbid obesity ([place]) 10/01/2013  . Excessive and frequent menstruation with irregular cycle 10/01/2013  . Adaptive colitis 10/01/2013  . History of migraine headaches 10/01/2013  . H/O malignant neoplasm of skin 10/01/2013  . Fatty liver disease, nonalcoholic 73/53/2992  . Diverticulitis 10/01/2013  . Former smoker 08/29/2013  . H/O total knee replacement 08/29/2013  . Insomnia 08/29/2013  . Dysmenorrhea 08/29/2013  . Airway hyperreactivity 08/29/2013  . Absolute anemia 08/29/2013     Plan:  Pap: Not needed and Not done Mammogram: Ordered, with her annual exam, pt has not completed Labs: none   Discussed treatment options including progestin, replacement of IUD or alternative progestin only method. Lysteda not recommended due to history  of stroke. Dicussed ablation  Procedure. She wants a method that will control bleeding with little mantance until she is menopausal . She is interested in ablation procedure. Recommended making appointment with Dr. Marcelline Mates to discuss. Pamphlet on ablation given for pt to review.   Routine preventative health maintenance measures emphasized: Diet/Weight control may help with AUB.    Philip Aspen, CNM

## 2020-11-14 ENCOUNTER — Other Ambulatory Visit: Payer: Self-pay

## 2020-11-14 ENCOUNTER — Emergency Department
Admission: EM | Admit: 2020-11-14 | Discharge: 2020-11-14 | Disposition: A | Discharge status: Home / Self Care | Payer: Medicaid Other | Attending: Emergency Medicine | Admitting: Emergency Medicine

## 2020-11-14 ENCOUNTER — Emergency Department: Payer: Medicaid Other

## 2020-11-14 DIAGNOSIS — E1169 Type 2 diabetes mellitus with other specified complication: Secondary | ICD-10-CM

## 2020-11-14 DIAGNOSIS — E1142 Type 2 diabetes mellitus with diabetic polyneuropathy: Secondary | ICD-10-CM | POA: Insufficient documentation

## 2020-11-14 DIAGNOSIS — Z8673 Personal history of transient ischemic attack (TIA), and cerebral infarction without residual deficits: Secondary | ICD-10-CM | POA: Insufficient documentation

## 2020-11-14 DIAGNOSIS — Z96651 Presence of right artificial knee joint: Secondary | ICD-10-CM | POA: Diagnosis not present

## 2020-11-14 DIAGNOSIS — M5412 Radiculopathy, cervical region: Secondary | ICD-10-CM | POA: Diagnosis not present

## 2020-11-14 DIAGNOSIS — Z794 Long term (current) use of insulin: Secondary | ICD-10-CM

## 2020-11-14 DIAGNOSIS — Z87891 Personal history of nicotine dependence: Secondary | ICD-10-CM | POA: Diagnosis not present

## 2020-11-14 DIAGNOSIS — E1165 Type 2 diabetes mellitus with hyperglycemia: Secondary | ICD-10-CM

## 2020-11-14 DIAGNOSIS — Z85820 Personal history of malignant melanoma of skin: Secondary | ICD-10-CM | POA: Diagnosis not present

## 2020-11-14 DIAGNOSIS — Z7982 Long term (current) use of aspirin: Secondary | ICD-10-CM | POA: Insufficient documentation

## 2020-11-14 DIAGNOSIS — I10 Essential (primary) hypertension: Secondary | ICD-10-CM | POA: Diagnosis not present

## 2020-11-14 DIAGNOSIS — J45909 Unspecified asthma, uncomplicated: Secondary | ICD-10-CM | POA: Diagnosis not present

## 2020-11-14 DIAGNOSIS — R202 Paresthesia of skin: Secondary | ICD-10-CM | POA: Diagnosis present

## 2020-11-14 DIAGNOSIS — Z79899 Other long term (current) drug therapy: Secondary | ICD-10-CM | POA: Insufficient documentation

## 2020-11-14 DIAGNOSIS — E119 Type 2 diabetes mellitus without complications: Secondary | ICD-10-CM

## 2020-11-14 MED ORDER — TRAZODONE HCL 50 MG PO TABS: 50.0000 mg | ORAL_TABLET | Freq: Every evening | ORAL | 1 refills | Status: DC | PRN

## 2020-11-14 MED ORDER — ATORVASTATIN CALCIUM 20 MG PO TABS: 20.0000 mg | ORAL_TABLET | Freq: Every day | ORAL | 3 refills | Status: DC

## 2020-11-14 MED ORDER — KETOROLAC TROMETHAMINE 30 MG/ML IJ SOLN
30.0000 mg | Freq: Once | INTRAMUSCULAR | Status: AC
  Administered 2020-11-14: 30 mg via INTRAMUSCULAR
  Filled 2020-11-14: qty 1

## 2020-11-14 MED ORDER — KETOROLAC TROMETHAMINE 10 MG PO TABS: 10.0000 mg | ORAL_TABLET | Freq: Three times a day (TID) | ORAL | 0 refills | Status: DC

## 2020-11-14 MED ORDER — INSULIN GLARGINE 100 UNIT/ML ~~LOC~~ SOLN: 100.0000 [IU] | Freq: Every day | SUBCUTANEOUS | 3 refills | Status: DC

## 2020-11-14 MED ORDER — CYCLOBENZAPRINE HCL 10 MG PO TABS: 10.0000 mg | ORAL_TABLET | Freq: Three times a day (TID) | ORAL | 1 refills | Status: DC | PRN

## 2020-11-14 MED ORDER — INSULIN LISPRO 100 UNIT/ML ~~LOC~~ SOLN: 30.0000 [IU] | Freq: Three times a day (TID) | SUBCUTANEOUS | 1 refills | Status: DC

## 2020-11-14 MED ORDER — BYDUREON 2 MG ~~LOC~~ PEN: 2.0000 mg | PEN_INJECTOR | SUBCUTANEOUS | 2 refills | Status: DC

## 2020-11-14 MED ORDER — GABAPENTIN 300 MG PO CAPS: 300.0000 mg | ORAL_CAPSULE | Freq: Three times a day (TID) | ORAL | 1 refills | Status: DC

## 2020-11-14 MED ORDER — PROMETHAZINE HCL 25 MG PO TABS: 25.0000 mg | ORAL_TABLET | Freq: Four times a day (QID) | ORAL | 0 refills | Status: DC | PRN

## 2020-11-14 MED ORDER — LISINOPRIL-HYDROCHLOROTHIAZIDE 20-12.5 MG PO TABS
2.0000 | ORAL_TABLET | Freq: Every day | ORAL | 3 refills | Status: DC
Start: 2020-11-14 — End: 2021-01-14

## 2020-11-14 NOTE — ED Provider Notes (Signed)
[facility] Emergency Department Provider Note ____________________________________________  Time seen: 2020  I have reviewed the triage vital signs and the nursing notes.  HISTORY  Chief Complaint  Numbness  HPI Brenda Hicks is a 54 y.o. female since her self to the ED for evaluation of chronic persistent right upper extremity  paresthesias and pain.  Patient is 1 year status post rotator cuff repair on the right, and has a history of ACDF of cervical spine for LUE paresthesias several years prior.  She reports that since her rotator cuff repair, she has had ongoing right arm pain that has not been addressed by her primary provider or her orthopedic specialist.  She presents today also citing that her primary care providers left the office, and she has been without most of her maintenance medicines for several weeks.  She denies any fever, chills, chest pain, shortness breath patient also denies any recent trauma, falls, skin changes, or edema.  She reports pain from the right shoulder that refers down to the hand.  She reports intermittent hand numbness and hand fatigue with activities like writing.  Past Medical History:  Diagnosis Date  . Anemia    vitamin d deficiency  . Arthritis   . Asthma    WELL CONTROLLED  . 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 Diabetes mellitus without complication ([place])   . Fatty liver   . Hypertension   . Kidney cysts    per patient, never had  . Renal disorder   . Sleep apnea    USES CPAP. waiting for new machine and a new sleep study  . Stroke [facility]) May or June 2019   TIA. no residual symptoms    Patient Active Problem List   Diagnosis Date Noted  . Frequent bowel movements 04/25/2020  . Abnormal Pap smear 03/31/2020  . Arthritis 03/31/2020  . Sprain of collateral ligament of right knee 03/10/2020  . Leg swelling 02/08/2020  . S/P cervical spinal fusion 09/24/2019  . Hyperlipidemia associated with type 2 diabetes  mellitus ([place]) 08/08/2019  . Low back pain radiating to left lower extremity 08/07/2019  . Nontraumatic incomplete tear of right rotator cuff 01/04/2019  . Tendinitis of upper biceps tendon of right shoulder 01/04/2019  . Rotator cuff tendinitis, right 12/01/2018  . Cervical myelopathy ([place]) 08/09/2018  . DJD (degenerative joint disease) of cervical spine 08/04/2018  . 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 History of ischemic stroke 03/30/2018  . Ischemic chest pain ([place]) 03/29/2018  . Chest pain 03/29/2018  . Sebaceous cyst 06/01/2016  . Obstructive apnea 05/31/2016  . Acid reflux 05/31/2016  . Essential hypertension 05/31/2016  . Abnormal Pap smear of cervix 05/31/2016  . Arthritis of knee, degenerative 07/25/2015  . History of artificial joint 07/25/2015  . Gonalgia 02/17/2015  . Type 2 diabetes mellitus ([place]) 10/23/2014  . 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 Diabetic polyneuropathy associated with type 2 diabetes mellitus ([place]) 05/16/2014  . Endometrial polyp 11/01/2013  . Fibroids, intramural 10/09/2013  . Pain due to knee joint prosthesis ([place]) 10/05/2013  . Body mass index (BMI) of 50-59.9 in adult ([place]) 10/03/2013  . Abnormal uterine bleeding 10/03/2013  . Morbid obesity ([place]) 10/01/2013  . Excessive and frequent menstruation with irregular cycle 10/01/2013  . Adaptive colitis 10/01/2013  . History of migraine headaches 10/01/2013  . H/O malignant neoplasm of skin 10/01/2013  .  Fatty liver disease, nonalcoholic 26/37/8588  . Diverticulitis 10/01/2013  . Former smoker 08/29/2013  . H/O total knee replacement 08/29/2013  . Insomnia 08/29/2013  . Dysmenorrhea 08/29/2013  . Airway hyperreactivity 08/29/2013  . Absolute anemia 08/29/2013    Past Surgical History:  Procedure Laterality Date  . ANTERIOR CERVICAL DECOMP/DISCECTOMY FUSION N/A 08/09/2018   Procedure:  ANTERIOR CERVICAL DECOMPRESSION/DISCECTOMY FUSION 1 LEVEL- C4-5;  Surgeon: Meade Maw, MD;  Location: [facility] ORS;  Service: Neurosurgery;  Laterality: N/A;  . BACK SURGERY    . DILATION AND CURETTAGE OF UTERUS    . ENDOMETRIAL BIOPSY     benign  . EXPLORATORY LAPAROTOMY  1992   REMOVAL OF RUPTURED ECTOPIC  . HAND SURGERY Right 1998   cyst removed  . HERNIA REPAIR  5027   UMBILICAL  . JOINT REPLACEMENT Right 2014   TKR  . KNEE ARTHROSCOPY Right 2012  . KNEE SURGERY Right 2014   total knee replacement  . SHOULDER ARTHROSCOPY WITH BICEPSTENOTOMY Left 12/14/2016   Procedure: SHOULDER ARTHROSCOPY WITH BICEPSTENOTOMY;  Surgeon: Corky Mull, MD;  Location: [facility] ORS;  Service: Orthopedics;  Laterality: Left;  . SHOULDER ARTHROSCOPY WITH OPEN ROTATOR CUFF REPAIR Left 12/14/2016   Procedure: SHOULDER ARTHROSCOPY WITH OPEN ROTATOR CUFF REPAIR AND ARTHROSCOPIC ROTATOR CUFF REPAIR;  Surgeon: Corky Mull, MD;  Location: [facility] ORS;  Service: Orthopedics;  Laterality: Left;  . SHOULDER ARTHROSCOPY WITH ROTATOR CUFF REPAIR Right 01/04/2019   Procedure: SHOULDER ARTHROSCOPY WITH ROTATOR CUFF REPAIR;  Surgeon: Corky Mull, MD;  Location: [facility] ORS;  Service: Orthopedics;  Laterality: Right;  . SHOULDER ARTHROSCOPY WITH SUBACROMIAL DECOMPRESSION Left 12/14/2016   Procedure: SHOULDER ARTHROSCOPY WITH SUBACROMIAL DECOMPRESSION;  Surgeon: Corky Mull, MD;  Location: [facility] ORS;  Service: Orthopedics;  Laterality: Left;  . SHOULDER ARTHROSCOPY WITH SUBACROMIAL DECOMPRESSION AND BICEP TENDON REPAIR Right 01/04/2019   Procedure: SHOULDER ARTHROSCOPY WITH DEBRIDEMENT AND SUBACROMIAL DECOMPRESSION-RIGHT;  Surgeon: Corky Mull, MD;  Location: [facility] ORS;  Service: Orthopedics;  Laterality: Right;  . TUBAL LIGATION      Prior to Admission medications   Medication Sig Start Date End Date Taking? Authorizing Provider  Accu-Chek FastClix Lancets MISC TEST TID Patient not taking: Reported on 11/10/2020 04/12/19    [provider]  ACCU-CHEK GUIDE test strip U TID UTD Patient not taking: Reported on 11/10/2020 04/12/19   [provider]  albuterol (VENTOLIN HFA) 108 (90 Base) MCG/ACT inhaler Inhale 2 puffs into the lungs every 6 (six) hours as needed for wheezing or shortness of breath. Patient not taking: Reported on 11/10/2020 10/24/19   Hubbard Hartshorn, FNP  atorvastatin (LIPITOR) 20 MG tablet Take 1 tablet (20 mg total) by mouth daily. 11/14/20   Nitzia Perren, Dannielle Karvonen, PA-C  Blood Glucose Monitoring Suppl (ACCU-CHEK GUIDE) w/Device KIT U UTD TID FOR FINGERSTICK TESTING Patient not taking: Reported on 11/10/2020 04/10/19   [provider]  cyclobenzaprine (FLEXERIL) 10 MG tablet Take 1 tablet (10 mg total) by mouth 3 (three) times daily as needed. 11/14/20   Bralen Wiltgen, Dannielle Karvonen, PA-C  Exenatide ER (BYDUREON) 2 MG PEN Inject 2 mg into the skin every Sunday. 11/16/20   Laconda Basich, Dannielle Karvonen, PA-C  gabapentin (NEURONTIN) 300 MG capsule Take 1 capsule (300 mg total) by mouth 3 (three) times daily. 11/14/20 01/13/21  Keighley Deckman, Dannielle Karvonen, PA-C  insulin glargine (LANTUS) 100 UNIT/ML injection Inject 1 mL (100 Units total) into the skin daily. 11/14/20   Myrakle Wingler, Dannielle Karvonen, PA-C  insulin lispro (HUMALOG) 100 UNIT/ML injection Inject 0.3 mLs (30 Units total) into the skin 3 (three) times daily before meals. 11/14/20   Steven Basso, Dannielle Karvonen, PA-C  Insulin Syringe-Needle U-100 (INSULIN SYRINGE 1CC/31GX5/16") 31G X 5/16" 1 ML MISC USE 1 QID Patient not taking: Reported on 11/10/2020 03/27/19   [provider]  ketorolac (TORADOL) 10 MG tablet Take 1 tablet (10 mg total) by mouth every 8 (eight) hours. 11/14/20   Henrick Mcgue, Dannielle Karvonen, PA-C  lisinopril-hydrochlorothiazide (ZESTORETIC) 20-12.5 MG tablet Take 2 tablets by mouth daily. 11/14/20   Yitzchak Kothari, Dannielle Karvonen, PA-C  naproxen sodium (ALEVE) 220 MG tablet Take 220 mg by mouth daily as needed. Patient not taking:  Reported on 11/10/2020    [provider]  promethazine (PHENERGAN) 25 MG tablet Take 1 tablet (25 mg total) by mouth every 6 (six) hours as needed for nausea or vomiting. 11/14/20   Bessy Reaney, Dannielle Karvonen, PA-C  QUEtiapine (SEROQUEL) 50 MG tablet Take 50 mg by mouth at bedtime. Patient not taking: Reported on 11/10/2020 09/29/18   [provider]  traZODone (DESYREL) 50 MG tablet Take 1 tablet (50 mg total) by mouth at bedtime as needed for sleep. 11/14/20   Kapil Petropoulos, Dannielle Karvonen, PA-C  venlafaxine (EFFEXOR) 37.5 MG tablet Take by mouth. 07/24/19 08/23/19  [provider]  Vitamin D, Ergocalciferol, (DRISDOL) 1.25 MG (50000 UT) CAPS capsule Take 50,000 Units by mouth every Monday. Patient not taking: Reported on 11/10/2020 04/25/18   [provider]    Allergies Morphine and related and Tramadol  Family History  Problem Relation Age of Onset  . Hypertension Mother   . CAD Maternal Grandmother   . Breast cancer Neg Hx   . Ovarian cancer Neg Hx   . Colon cancer Neg Hx     Social History Social History   Tobacco Use  . Smoking status: Former Smoker    Packs/day: 2.00    Years: 25.00    Pack years: 50.00    Types: Cigarettes    Quit date: 06/09/2017    Years since quitting: 3.4  . Smokeless tobacco: Never Used  Vaping Use  . Vaping Use: Never used  Substance Use Topics  . Alcohol use: No  . Drug use: Yes    Types: Marijuana    Comment: Pt. uses marjiuana only when she has severe back and side pain    Review of Systems  Constitutional: Negative for fever. Eyes: Negative for visual changes. ENT: Negative for sore throat. Cardiovascular: Negative for chest pain. Respiratory: Negative for shortness of breath. Gastrointestinal: Negative for abdominal pain, vomiting and diarrhea. Genitourinary: Negative for dysuria. Musculoskeletal: Negative for back pain. Skin: Negative for rash. Neurological: Negative for headaches, focal weakness or  numbness. RUE paresthesias above.  ____________________________________________  PHYSICAL EXAM:  VITAL SIGNS: ED Triage Vitals  Enc Vitals Group     BP 11/14/20 1654 (!) 154/108     Pulse Rate 11/14/20 1654 84     Resp 11/14/20 1654 20     Temp 11/14/20 1654 99 F (37.2 C)     Temp Source 11/14/20 1654 Oral     SpO2 11/14/20 1654 97 %     Weight 11/14/20 1654 (!) 320 lb (145.2 kg)     Height 11/14/20 1654 '5\' 6"'  (1.676 m)     Head Circumference --      Peak Flow --      Pain Score 11/14/20 1700 10     Pain  Loc --      Pain Edu? --      Excl. in [place]? --     Constitutional: Alert and oriented. Well appearing and in no distress. Head: Normocephalic and atraumatic. Eyes: Conjunctivae are normal. Normal extraocular movements Neck: Supple. No thyromegaly. Normal ROM without crepitus.  Cardiovascular: Normal rate, regular rhythm. Normal distal pulses. Respiratory: Normal respiratory effort. No wheezes/rales/rhonchi. Gastrointestinal: Soft and nontender. No distention. Musculoskeletal: Right upper extremity without obvious deformity or dislocation.  Patient with full active range of motion on the right.  Full rotator cuff testing bilaterally.  Nontender with normal range of motion in all extremities.  Neurologic: Cranial nerves II to XII grossly intact.  Normal UV DTRs bilaterally.  Normal gait without ataxia. Normal speech and language. No gross focal neurologic deficits are appreciated. Skin:  Skin is warm, dry and intact. No rash noted. Psychiatric: Mood and affect are normal. Patient exhibits appropriate insight and judgment. ____________________________________________   LABS (pertinent positives/negatives) Labs Reviewed - No data to display ___________________________________________  EKG  NSR 73 bpm PR interval 158 ms QRS duration 74 ms Normal axis No STEMI ____________________________________________   RADIOLOGY  DG Cervical Spine  IMPRESSION: 1. No  acute/traumatic cervical spine pathology. 2. C4-C5 ACDF. ____________________________________________  PROCEDURES  Toradol 30 mg IM  Procedures ____________________________________________  INITIAL IMPRESSION / ASSESSMENT AND PLAN / ED COURSE  DDX: cervical radiculopathy, RC tendinitis, bursitis,   Patient with ED evaluation of chronic right upper extremity pain and paresthesias.  Patient presented today due to increasing pain in the right upper extremity.  She has been evaluated and followed by primary provider, Ortho provider, and neurologist over the year with no meaningful interventions.  She was found on exam today to be otherwise stable and benign without any signs of acute neuromuscular deficit.  Rotator cuff testing is solid and intact.  Skin is warm, dry, with normal pulses and gross sensation.  Patient was treated empirically with anti-inflammatories.  She will be discharged with a prescription for ketorolac as well as muscle relaxants.  As a courtesy, the patient's maintenance medications were refilled at this visit as well.  She is referred to [facility] neurology for ongoing symptom management.  She is also advised to select a new primary care provider for routine care.  Return precautions have been discussed.  Tykera NATELIE OSTROSKY was evaluated in Emergency Department on 11/14/2020 for the symptoms described in the history of present illness. She was evaluated in the context of the global COVID-19 pandemic, which necessitated consideration that the patient might be at risk for infection with the SARS-CoV-2 virus that causes COVID-19. Institutional protocols and algorithms that pertain to the evaluation of patients at risk for COVID-19 are in a state of rapid change based on information released by regulatory bodies including the CDC and federal and state organizations. These policies and algorithms were followed during the patient's care in the  ED. ____________________________________________  FINAL CLINICAL IMPRESSION(S) / ED DIAGNOSES  Final diagnoses:  Cervical radiculopathy      Idamay Hosein, Dannielle Karvonen, PA-C 11/14/20 2107    Duffy Bruce, MD 11/14/20 2251

## 2020-11-14 NOTE — ED Triage Notes (Signed)
PT to ED via POV c/o R arm neck pain and tingling. SX for 1 year. Has seen primary for it before with no answers. HX of surgery to that rotator cuff.

## 2020-11-14 NOTE — Discharge Instructions (Addendum)
You are being treated for symptoms are likely represent a radiculopathy from your cervical spine.  Take prescription medications as provided.  You should select and follow-up with a new primary provider for routine care.  Consider referral back to neurology for ongoing symptom management.  Take the medications as prescribed and return to the ED if needed.

## 2020-11-14 NOTE — ED Notes (Signed)
Spoke to md regarding pt, see orders

## 2020-11-19 ENCOUNTER — Telehealth: Payer: Self-pay | Admitting: Student in an Organized Health Care Education/Training Program

## 2020-11-19 NOTE — Telephone Encounter (Signed)
Spoke with Patty at [facility] We received a referral from Dr. Marcelino Duster This patient was previously seen by Dr. Cherylann Ratel and has been dismissed from the clinic.

## 2020-11-23 IMAGING — US US EXTREM LOW VENOUS
1 series · 13 of 24 positions shown · non-contrast
Comparison: None.

CLINICAL DATA: Bilateral lower extremity pain and edema.



[Series 1: us extrem low venous · 0.07mm/px · 13 of 59 slices shown]
[im 1/59]
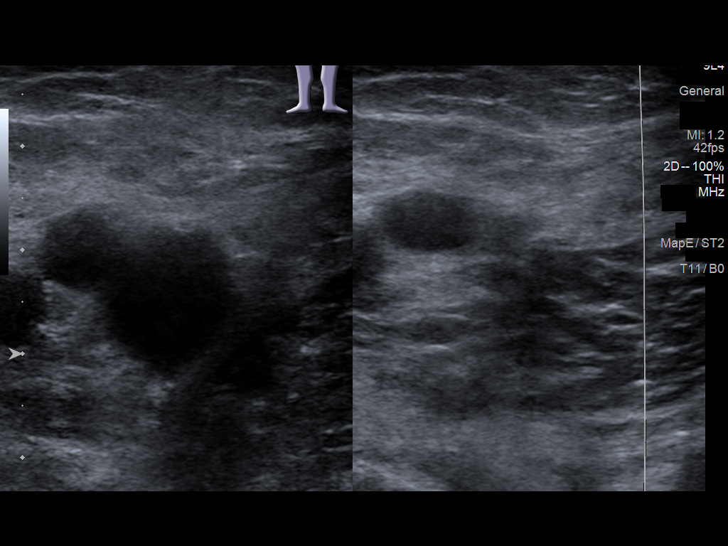
[im 6/59]
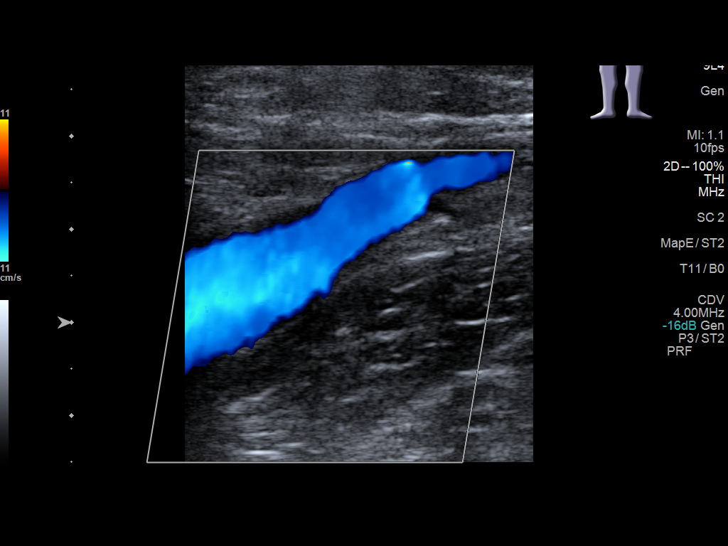
[im 11/59]
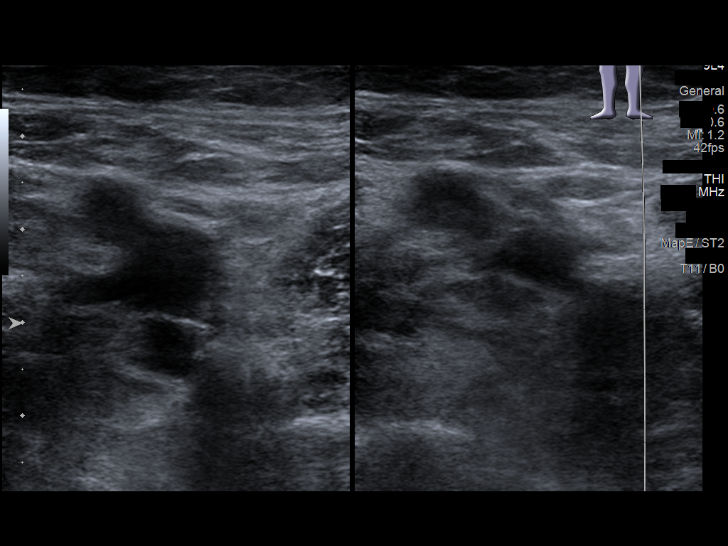
[im 16/59]
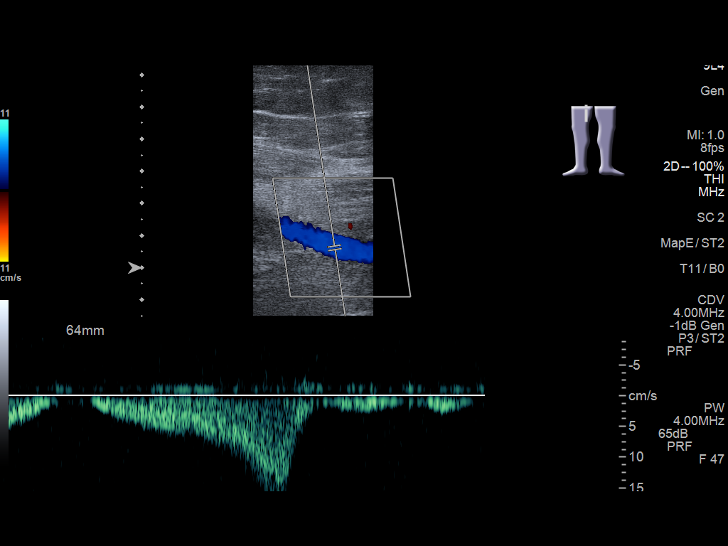
[im 21/59]
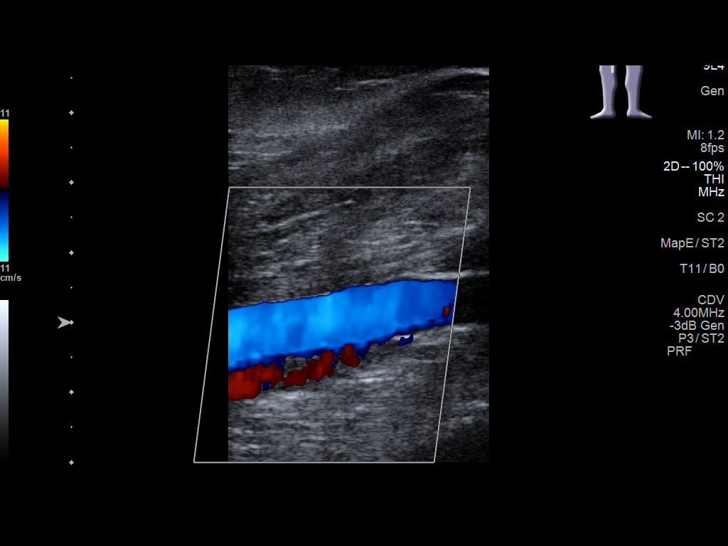
[im 26/59]
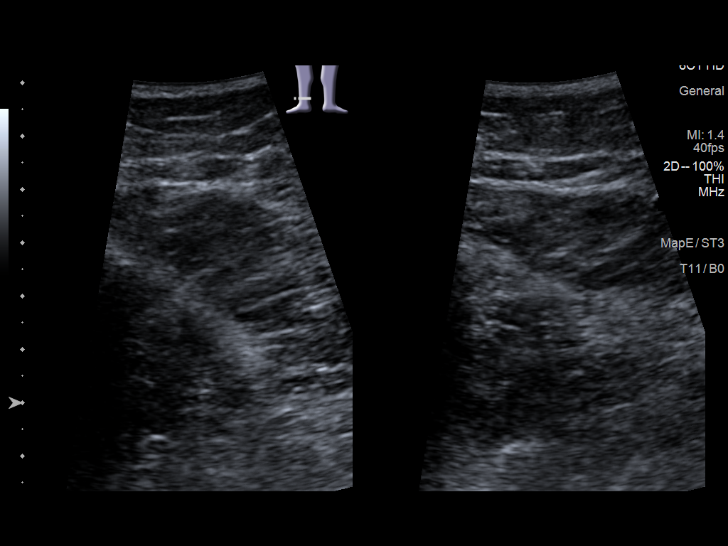
[im 31/59]
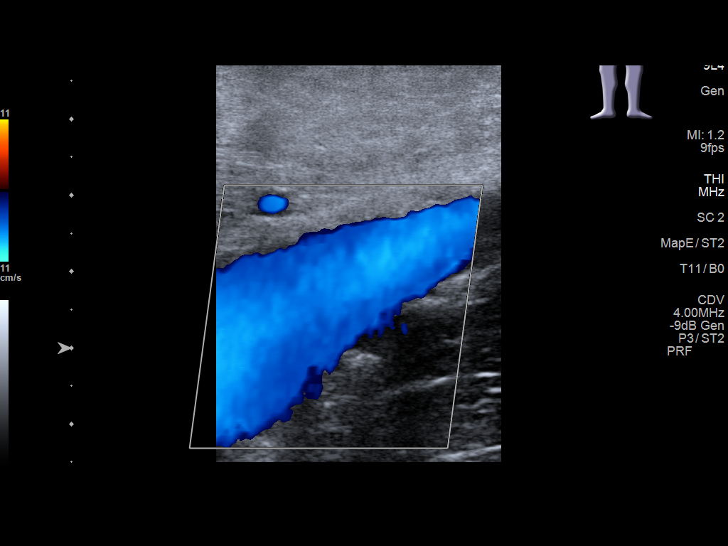
[im 33/59]
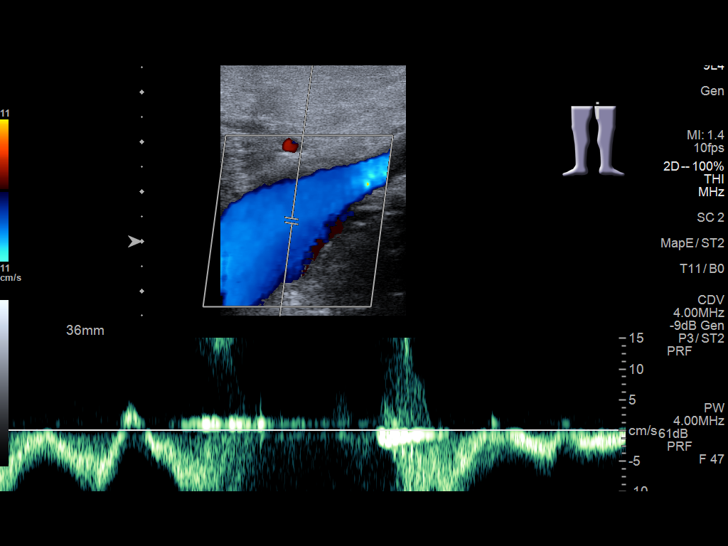
[im 38/59]
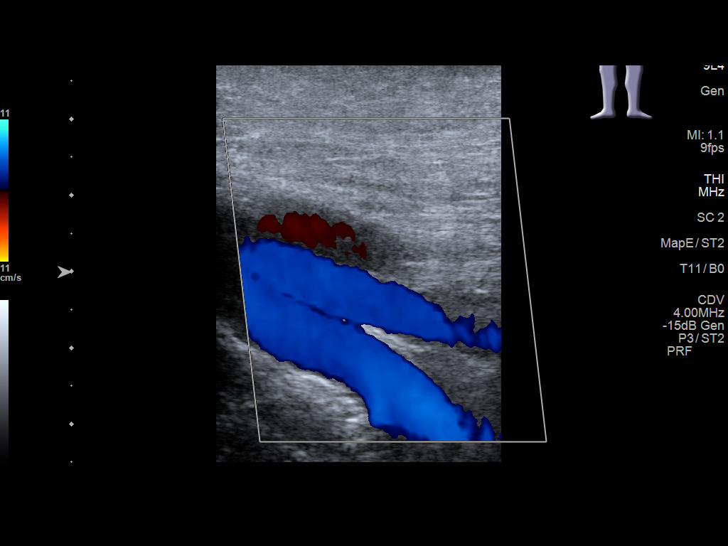
[im 43/59]
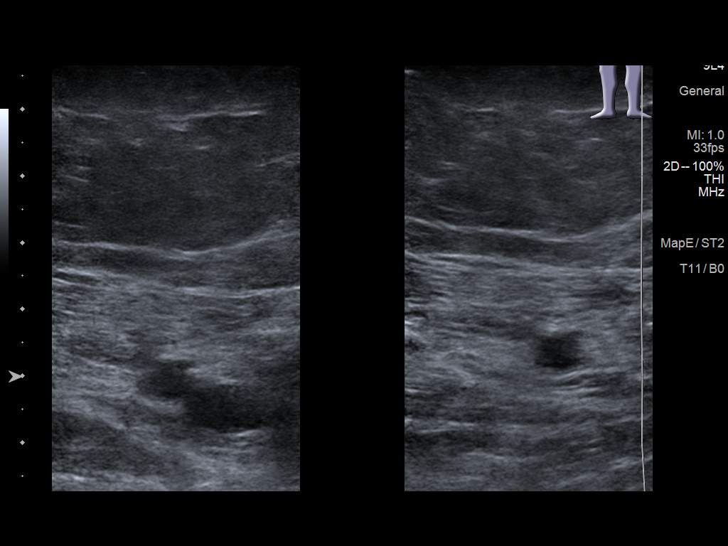
[im 48/59]
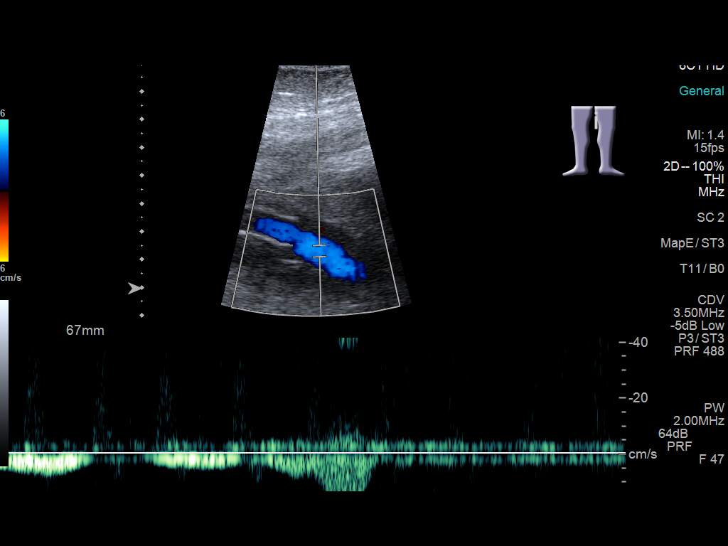
[im 53/59]
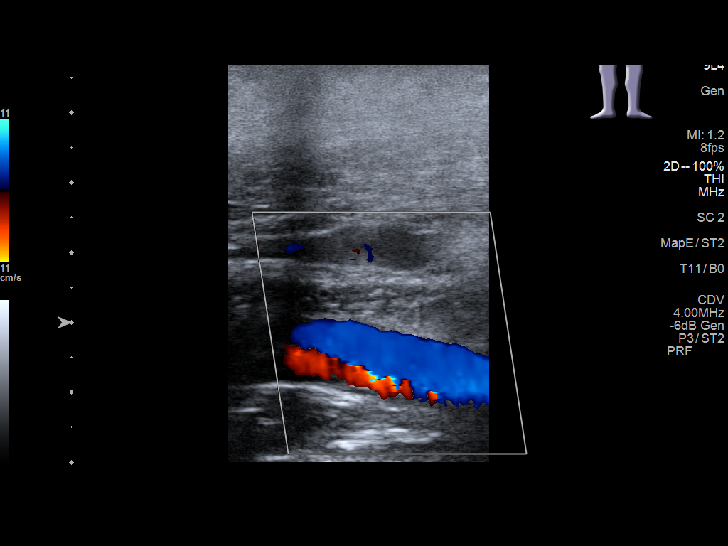
[im 59/59]
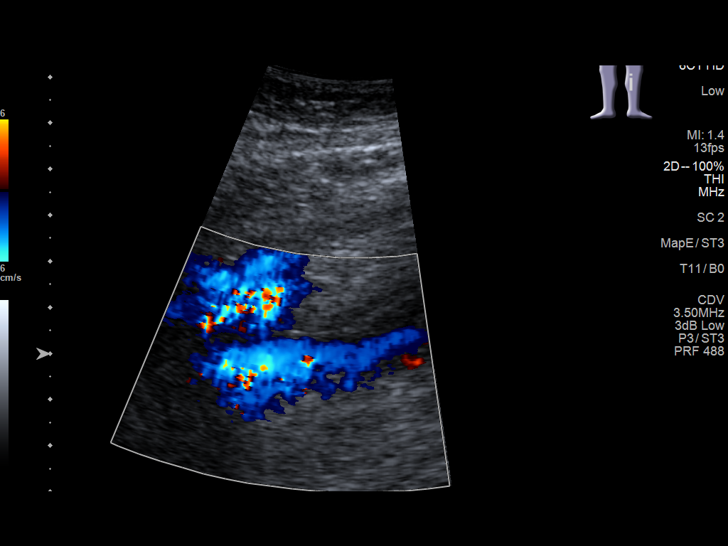

[13 of 24 positions shown; findings below may reference images not displayed]

FINDINGS: RIGHT LOWER EXTREMITY

Common Femoral Vein: No evidence of thrombus. Normal
compressibility, respiratory phasicity and response to augmentation.

Saphenofemoral Junction: No evidence of thrombus. Normal
compressibility and flow on color Doppler imaging.

Profunda Femoral Vein: No evidence of thrombus. Normal
compressibility and flow on color Doppler imaging.

Femoral Vein: No evidence of thrombus. Normal compressibility,
respiratory phasicity and response to augmentation.

Popliteal Vein: No evidence of thrombus. Normal compressibility,
respiratory phasicity and response to augmentation.

Calf Veins: No evidence of thrombus. Normal compressibility and flow
on color Doppler imaging.

Superficial Great Saphenous Vein: No evidence of thrombus. Normal
compressibility.

Venous Reflux:  None.

Other Findings: No evidence of superficial thrombophlebitis or
abnormal fluid collection.

LEFT LOWER EXTREMITY

Common Femoral Vein: No evidence of thrombus. Normal
compressibility, respiratory phasicity and response to augmentation.

Saphenofemoral Junction: No evidence of thrombus. Normal
compressibility and flow on color Doppler imaging.

Profunda Femoral Vein: No evidence of thrombus. Normal
compressibility and flow on color Doppler imaging.

Femoral Vein: No evidence of thrombus. Normal compressibility,
respiratory phasicity and response to augmentation.

Popliteal Vein: No evidence of thrombus. Normal compressibility,
respiratory phasicity and response to augmentation.

Calf Veins: No evidence of thrombus. Normal compressibility and flow
on color Doppler imaging.

Superficial Great Saphenous Vein: No evidence of thrombus. Normal
compressibility.

Venous Reflux:  None.

Other Findings: No evidence of superficial thrombophlebitis or
abnormal fluid collection.
IMPRESSION: No evidence of deep venous thrombosis in either lower extremity.

## 2020-12-09 ENCOUNTER — Encounter: Payer: Medicaid Other | Admitting: Obstetrics and Gynecology

## 2020-12-10 ENCOUNTER — Ambulatory Visit (INDEPENDENT_AMBULATORY_CARE_PROVIDER_SITE_OTHER): Payer: Medicaid Other | Admitting: Obstetrics and Gynecology

## 2020-12-10 ENCOUNTER — Encounter: Payer: Self-pay | Admitting: Obstetrics and Gynecology

## 2020-12-10 ENCOUNTER — Other Ambulatory Visit: Payer: Self-pay

## 2020-12-10 VITALS — Ht 66.0 in | Wt 318.6 lb

## 2020-12-10 DIAGNOSIS — I10 Essential (primary) hypertension: Secondary | ICD-10-CM

## 2020-12-10 DIAGNOSIS — G4733 Obstructive sleep apnea (adult) (pediatric): Secondary | ICD-10-CM

## 2020-12-10 DIAGNOSIS — N924 Excessive bleeding in the premenopausal period: Secondary | ICD-10-CM

## 2020-12-10 DIAGNOSIS — E1165 Type 2 diabetes mellitus with hyperglycemia: Secondary | ICD-10-CM | POA: Diagnosis not present

## 2020-12-10 DIAGNOSIS — Z6841 Body Mass Index (BMI) 40.0 and over, adult: Secondary | ICD-10-CM

## 2020-12-10 DIAGNOSIS — D251 Intramural leiomyoma of uterus: Secondary | ICD-10-CM

## 2020-12-10 DIAGNOSIS — Z794 Long term (current) use of insulin: Secondary | ICD-10-CM

## 2020-12-10 NOTE — Patient Instructions (Signed)
Endometrial Ablation Endometrial ablation is a procedure that destroys the thin inner layer of the lining of the uterus (endometrium). This procedure may be done:  To stop heavy menstrual periods.  To stop bleeding that is causing anemia.  To control irregular bleeding.  To treat bleeding caused by small tumors (fibroids) in the endometrium. This procedure is often done as an alternative to major surgery, such as removal of the uterus and cervix (hysterectomy). As a result of this procedure:  You may not be able to have children. However, if you have not yet gone through menopause: ? You may still have a small chance of getting pregnant. ? You will need to use a reliable method of birth control after the procedure to prevent pregnancy.  You may stop having a menstrual period, or you may have only a small amount of bleeding during your period. Menstruation may return several years after the procedure. Tell a health care provider about:  Any allergies you have.  All medicines you are taking, including vitamins, herbs, eye drops, creams, and over-the-counter medicines.  Any problems you or family members have had with the use of anesthetic medicines.  Any blood disorders you have.  Any surgeries you have had.  Any medical conditions you have.  Whether you are pregnant or may be pregnant. What are the risks? Generally, this is a safe procedure. However, problems may occur, including:  A hole (perforation) in the uterus or bowel.  Infection in the uterus, bladder, or vagina.  Bleeding.  Allergic reaction to medicines.  Damage to nearby structures or organs.  An air bubble in the lung (air embolus).  Problems with pregnancy.  Failure of the procedure.  Decreased ability to diagnose cancer in the endometrium. Scar tissue forms after the procedure, making it more difficult to get a sample of the uterine lining. What happens before the procedure? Medicines Ask your health  care provider about:  Changing or stopping your regular medicines. This is especially important if you take diabetes medicines or blood thinners.  Taking medicines such as aspirin and ibuprofen. These medicines can thin your blood. Do not take these medicines before your procedure if your doctor tells you not to take them.  Taking over-the-counter medicines, vitamins, herbs, and supplements. Tests  You will have tests of your endometrium to make sure there are no precancerous cells or cancer cells present.  You may have an ultrasound of the uterus. General instructions  Do not use any products that contain nicotine or tobacco for at least 4 weeks before the procedure. These include cigarettes, chewing tobacco, and vaping devices, such as e-cigarettes. If you need help quitting, ask your health care provider.  You may be given medicines to thin the endometrium.  Ask your health care provider what steps will be taken to help prevent infection. These steps may include: ? Removing hair at the surgery site. ? Washing skin with a germ-killing soap. ? Taking antibiotic medicine.  Plan to have a responsible adult take you home from the hospital or clinic.  Plan to have a responsible adult care for you for the time you are told after you leave the hospital or clinic. This is important. What happens during the procedure?  You will lie on an exam table with your feet and legs supported as in a pelvic exam.  An IV will be inserted into one of your veins.  You will be given a medicine to help you relax (sedative).  A surgical tool with   a light and camera (resectoscope) will be inserted into your vagina and moved into your uterus. This allows your surgeon to see inside your uterus.  Endometrial tissue will be destroyed and removed, using one of the following methods: ? Radiofrequency. This uses an electrical current to destroy the endometrium. ? Cryotherapy. This uses extreme cold to freeze  the endometrium. ? Heated fluid. This uses a heated salt and water (saline) solution to destroy the endometrium. ? Microwave. This uses high-energy microwaves to heat up the endometrium and destroy it. ? Thermal balloon. This involves inserting a catheter with a balloon tip into the uterus. The balloon tip is filled with heated fluid to destroy the endometrium. The procedure may vary among health care providers and hospitals.   What happens after the procedure?  Your blood pressure, heart rate, breathing rate, and blood oxygen level will be monitored until you leave the hospital or clinic.  You may have vaginal bleeding for 4-6 weeks after the procedure. You may also have: ? Cramps. ? A thin, watery vaginal discharge that is light pink or brown. ? A need to urinate more than usual. ? Nausea.  If you were given a sedative during the procedure, it can affect you for several hours. Do not drive or operate machinery until your health care provider says that it is safe.  Do not have sex or insert anything into your vagina until your health care provider says it is safe. Summary  Endometrial ablation is done to treat many causes of heavy menstrual bleeding. The procedure destroys the thin inner layer of the lining of the uterus (endometrium).  This procedure is often done as an alternative to major surgery, such as removal of the uterus and cervix (hysterectomy).  Plan to have a responsible adult take you home from the hospital or clinic. This information is not intended to replace advice given to you by your health care provider. Make sure you discuss any questions you have with your health care provider. Document Revised: 05/29/2020 Document Reviewed: 05/29/2020 Elsevier Patient Education  2021 [employer]       

## 2020-12-10 NOTE — Progress Notes (Signed)
Pt present to discuss hysterectomy. Pt stated that she was tired of irregular cycles and other issues concerning her cycles.

## 2020-12-10 NOTE — Progress Notes (Signed)
GYNECOLOGY PROGRESS NOTE  Subjective:    Patient ID: Brenda Hicks, female    DOB: 1966-09-22, 55 y.o.   MRN: [id number]  HPI  Patient is a 55 y.o. [id number] female who presents for surgical consultation. Has previously been seen by Philip Aspen, CNM.  [name] reports that she has had problems with her cycles since she was a teen. Cycles are often irregular and heavy. Has used Depo Provera, IUD (1st one worked, second one with second one expelled). She notes that after the birth of her last child and tubal ligation in 1991, that her cycles have progressively worsened. Sometimes uses pads, tampons, and diapers simultaneously to control her bleeding. She even notes sometimes having severe cramping without a cycle.  She has been seeking definitive management with hysterectomy for several years, but has been dissuaded by doctors due to her medical comorbidities and being told that she was "close to menopause" so probably won't deal with bleeding for much longer. She has had a negative workup in the past with the exception of a small fibroid (intramural).   [name] notes working on controlling her DM A1c has gone down from 11 to 9. Has lost 48 lbs.    GYN History:  Last pap smear: 01/29/2019.  Results were normal.  Contraception: BTL   The following portions of the patient's history were reviewed and updated as appropriate:  She  has a past medical history of Anemia, Arthritis, Asthma, Cancer of ear, Diabetes mellitus without complication ([place]), Fatty liver, Hypertension, Kidney cysts, Renal disorder, Sleep apnea, and Stroke [facility]) (May or June 2019).   She  has a past surgical history that includes Knee surgery (Right, 2014); Dilation and curettage of uterus; Endometrial biopsy; Exploratory laparotomy (1992); Hand surgery (Right, 1998); Tubal ligation; Knee arthroscopy (Right, 2012); Shoulder arthroscopy with open rotator cuff repair (Left, 12/14/2016); Shoulder arthroscopy with subacromial  decompression (Left, 12/14/2016); Shoulder arthroscopy with bicepstenotomy (Left, 12/14/2016); Anterior cervical decomp/discectomy fusion (N/A, 08/09/2018); Joint replacement (Right, 2014); Hernia repair (1975); Back surgery; Shoulder arthroscopy with subacromial decompression and bicep tendon repair (Right, 01/04/2019); and Shoulder arthroscopy with rotator cuff repair (Right, 01/04/2019).   Her family history includes CAD in her maternal grandmother; Hypertension in her mother. She has a current medication list which includes the following prescription(s): albuterol, atorvastatin, cyclobenzaprine, gabapentin, insulin glargine, insulin lispro, insulin syringe 1cc/31gx5/16", ketorolac, lisinopril-hydrochlorothiazide, naproxen sodium, promethazine, quetiapine, trazodone, venlafaxine, vitamin d (ergocalciferol), accu-chek fastclix lancets, accu-chek guide, accu-chek guide, and bydureon.   She reports that she quit smoking about 3 years ago. Her smoking use included cigarettes. She has a 50.00 pack-year smoking history. She has never used smokeless tobacco. She reports current drug use. Drug: Marijuana. She reports that she does not drink alcohol.  She is allergic to morphine and related and tramadol..  Review of Systems Pertinent items noted in HPI and remainder of comprehensive ROS otherwise negative.   Objective:   Height 5\' 6"  (1.676 m), weight (!) 318 lb 9.6 oz (144.5 kg), last menstrual period 11/23/2020. Body mass index is 51.42 kg/m.  General appearance: alert, cooperative and no distress. Morbid obesity.  Remainder of exam deferred.    Pathology:  ENDOMETRIUM, BIOPSY - 11/06/2019.  - Inactive endometrium with progestational changes.  - No hyperplasia or malignancy.    Imaging:  Patient Name: Brenda Hicks DOB: Oct 23, 1966 MRN: [id number] ULTRASOUND REPORT  Location: [facility]  Date of Service: 11/06/2019   Indications:Abnormal Uterine Bleeding Findings:  The uterus is  anteverted and measures  10.8 x 6.7 x 5.9cm. Echo texture is heterogenous with evidence of focal mass. Within the uterus there is one suspected fibroid measuring: Fibroid 1:  3.8 x 2.7 x 2.6cm (IM, LT)  The Endometrium is thickened and heterogeneous measuring 13.8 mm. IUD is not seen.   Bilateral ovaries are not seen due to body habitus. Survey of the adnexa demonstrates no adnexal masses. There is no free fluid in the cul de sac.  Impression: 1. Intramural fibroid again seen 3.8cm. 2. Thickened and heterogeneous endometrial canal. IUD is not seen.   Recommendations: 1.Clinical correlation with the patient's History and Physical Exam. 2. Correlate endometrial thickness with menstrual history. If postmenopausal biopsy would be recommended.  3. IUD not seen, abdominal and pelvic x-ray recommended .    Vita Barley, RT   I have reviewed this ultrasound and the report. I agree with the above assessment and plan.  [facility] 11/06/19 11:03 AM Assessment:   1. Abnormal perimenopausal bleeding   2. Morbid obesity with BMI of 50.0-59.9, adult ([place])   3. Type 2 diabetes mellitus with hyperglycemia, with long-term current use of insulin ([place])   4. Obstructive sleep apnea syndrome   5. Essential hypertension   6. Intramural leiomyoma of uterus     Plan:   - Discussed management options for likely perimenopausal uterine bleeding including tranexamic acid (Lysteda), oral progesterone, Levonogestrel IUD, endometrial ablation or hysterectomy as definitive surgical management.  Discussed risks and benefits of each method based on her medical comorbidities.   After discussion, patient thinks that she would consider endometrial ablation.  Printed patient education handouts were given to the patient to review at home. Discussed need for repeat endometrial biopsy if surgical intervention desired. To return in 1-2 weeks for biospy and  further discussion of surgery with likely pre-op.    A total of 15 minutes were spent face-to-face with the patient during this encounter and over half of that time dealt with review of previous records, and counseling of options.    Rubie Maid, MD [facility]

## 2020-12-11 ENCOUNTER — Other Ambulatory Visit: Payer: Self-pay | Admitting: Nurse Practitioner

## 2020-12-11 DIAGNOSIS — Z981 Arthrodesis status: Secondary | ICD-10-CM

## 2020-12-13 IMAGING — DX DG TIBIA/FIBULA 2V*R*
4 series · 4 of 4 positions shown · non-contrast
Comparison: Knee study of 03/02/2020

CLINICAL DATA: Pain status post fall

EXAM:
RIGHT TIBIA AND FIBULA - 2 VIEW

[tibia lat (1 of 2)]
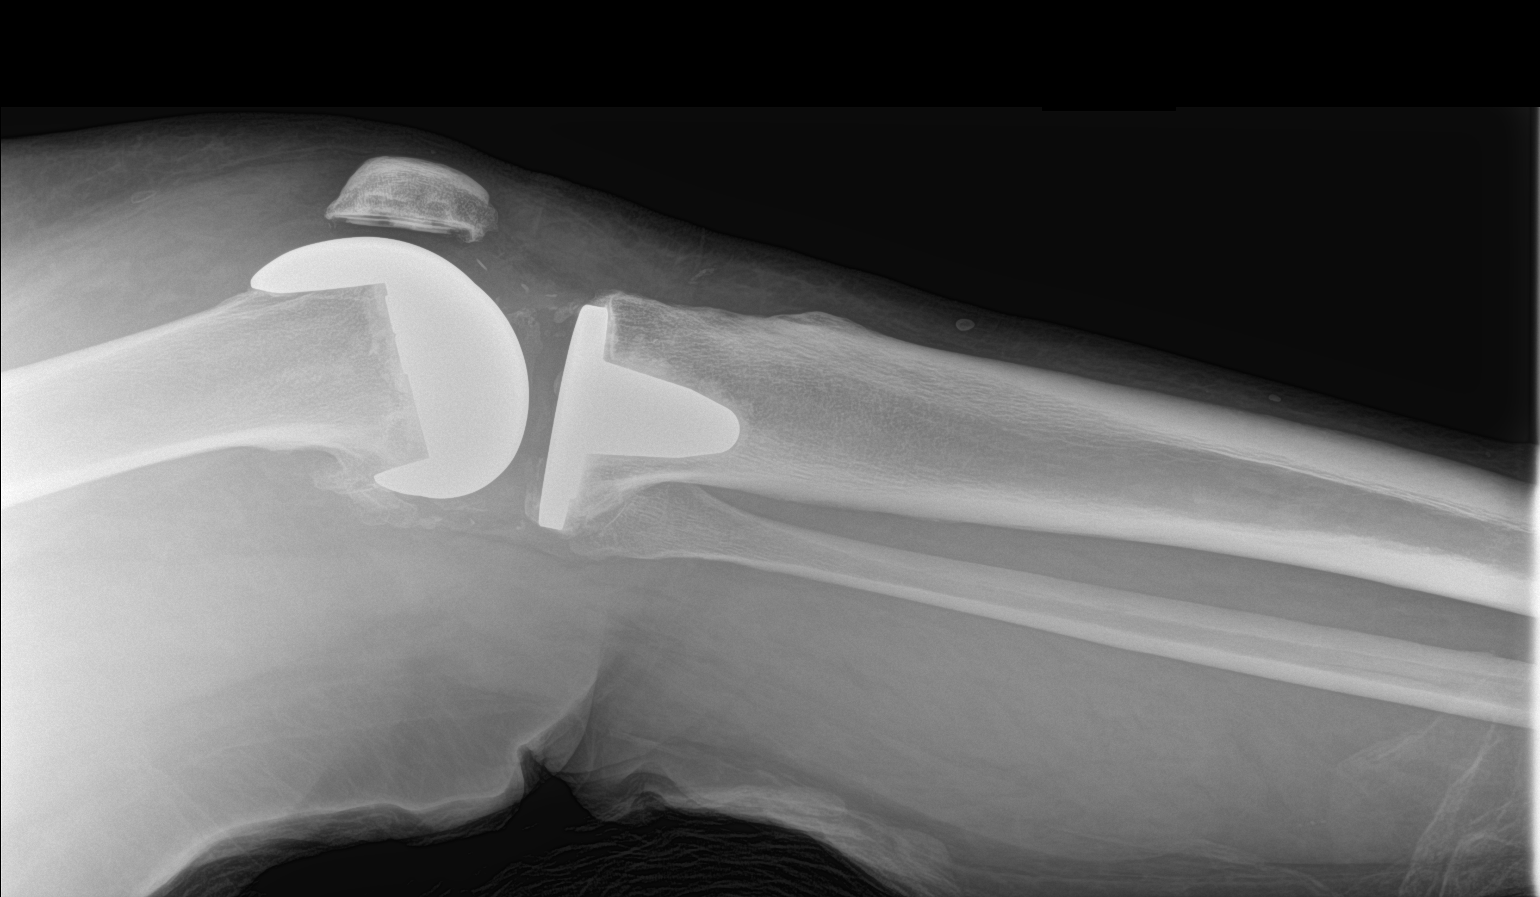

[tibia ap (1 of 2)]
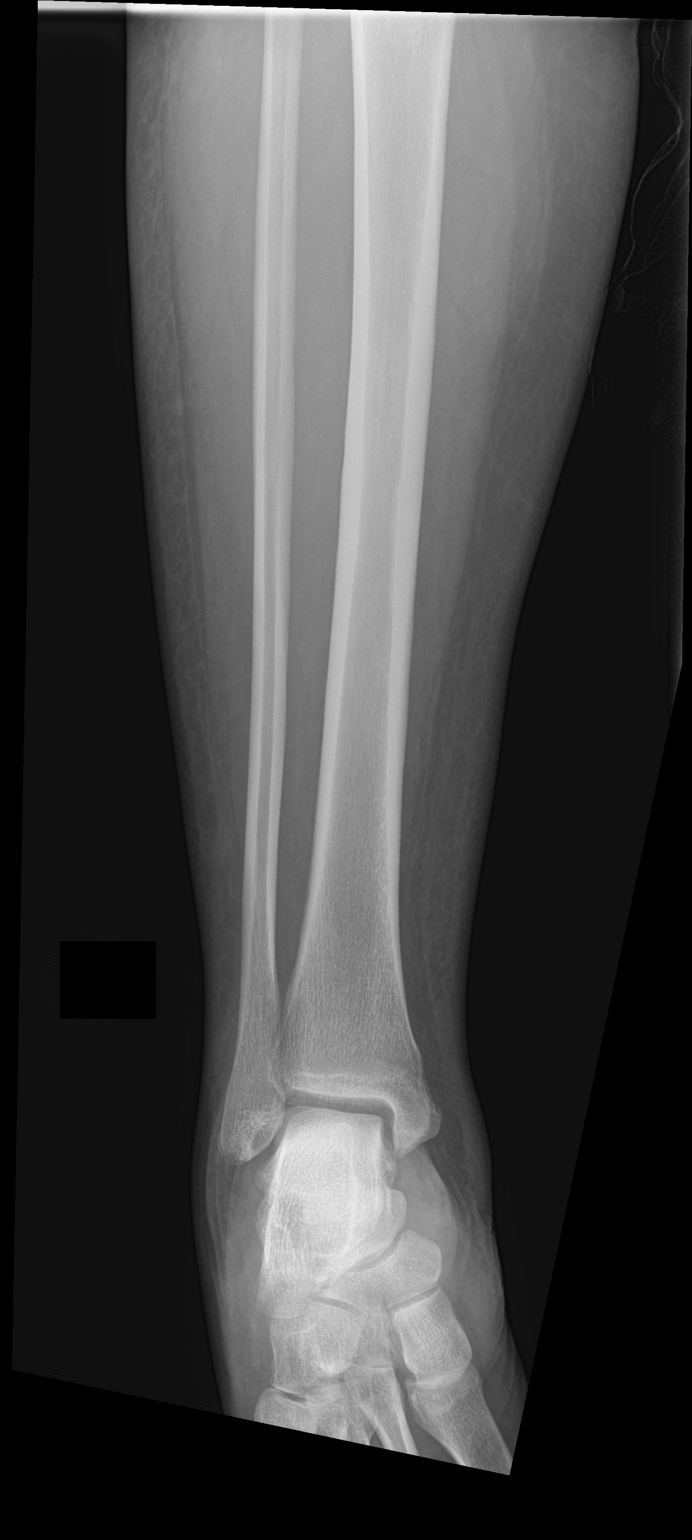

[tibia lat (2 of 2)]
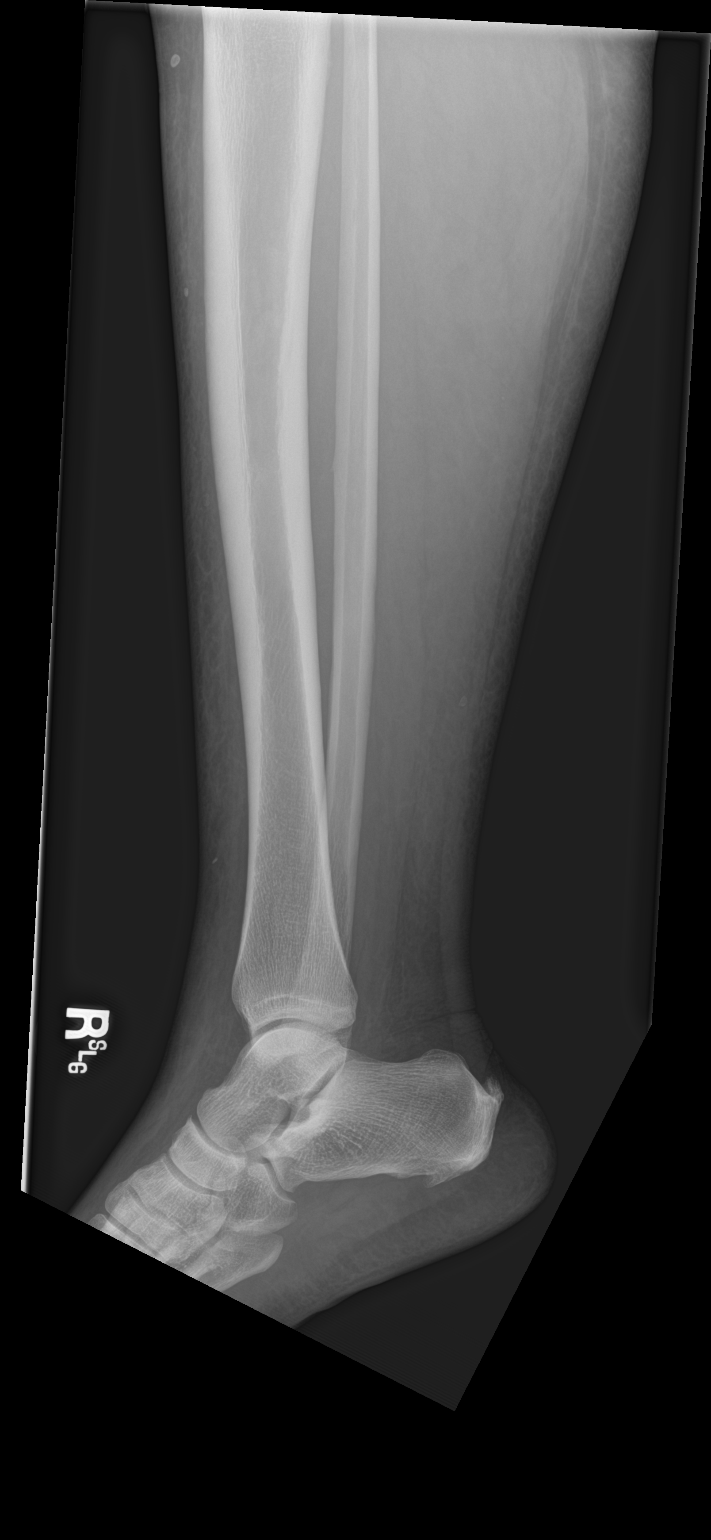

[tibia ap (2 of 2)]
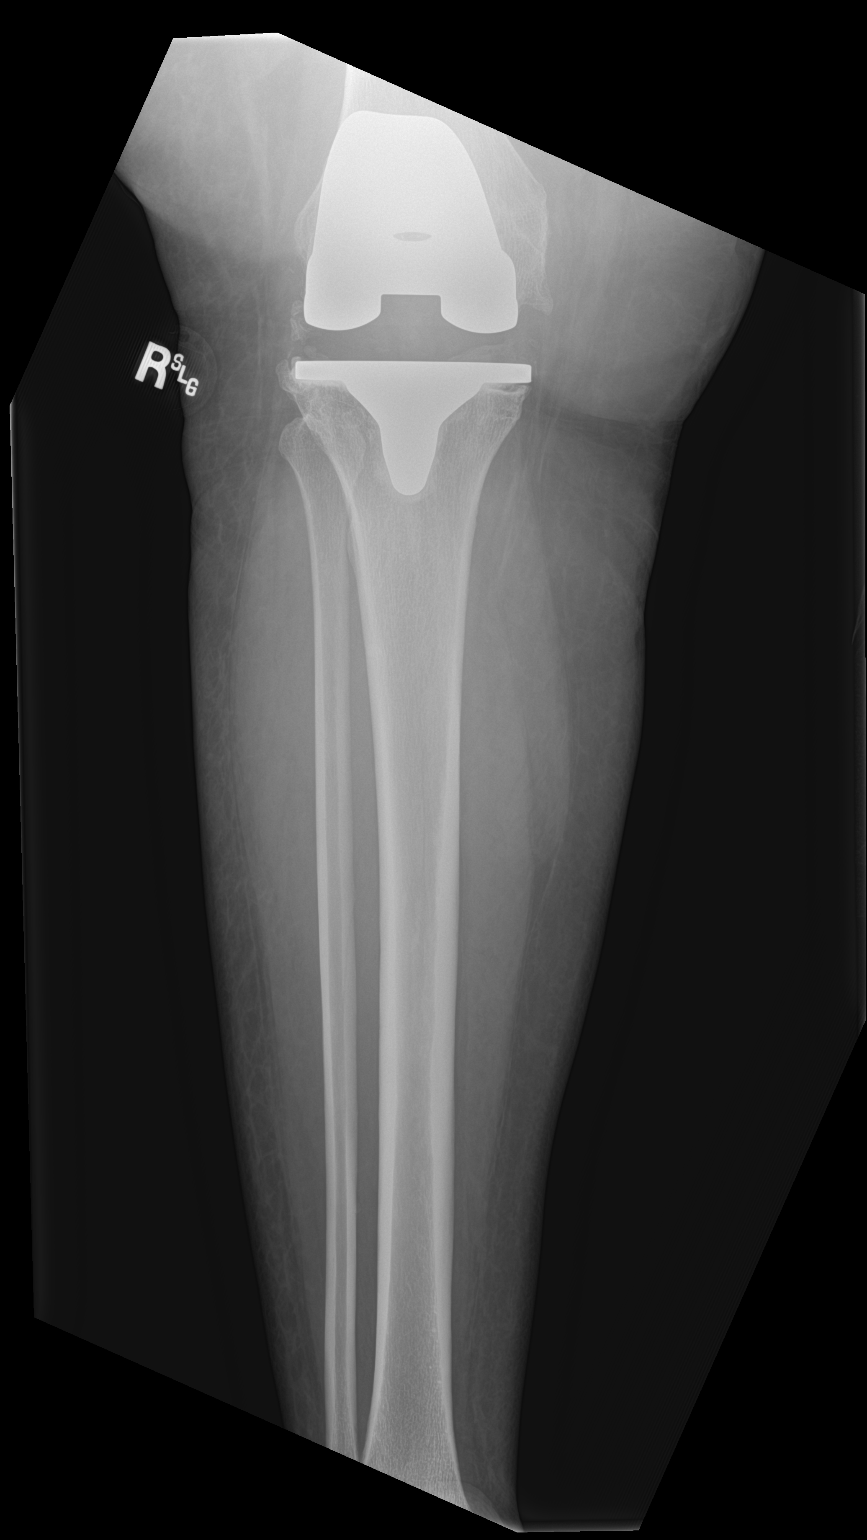

[4 of 4 positions shown; findings below may reference images not displayed]

FINDINGS: Signs of right total knee arthroplasty as before. No sign of acute
fracture or dislocation.
IMPRESSION: Negative evaluation with changes of right knee arthroplasty.

## 2020-12-13 IMAGING — DX DG FOOT COMPLETE 3+V*R*
3 series · 3 of 3 positions shown · non-contrast
Comparison: None.

CLINICAL DATA: Fall, right leg pain

EXAM:
RIGHT FOOT COMPLETE - 3+ VIEW

[foot ap]
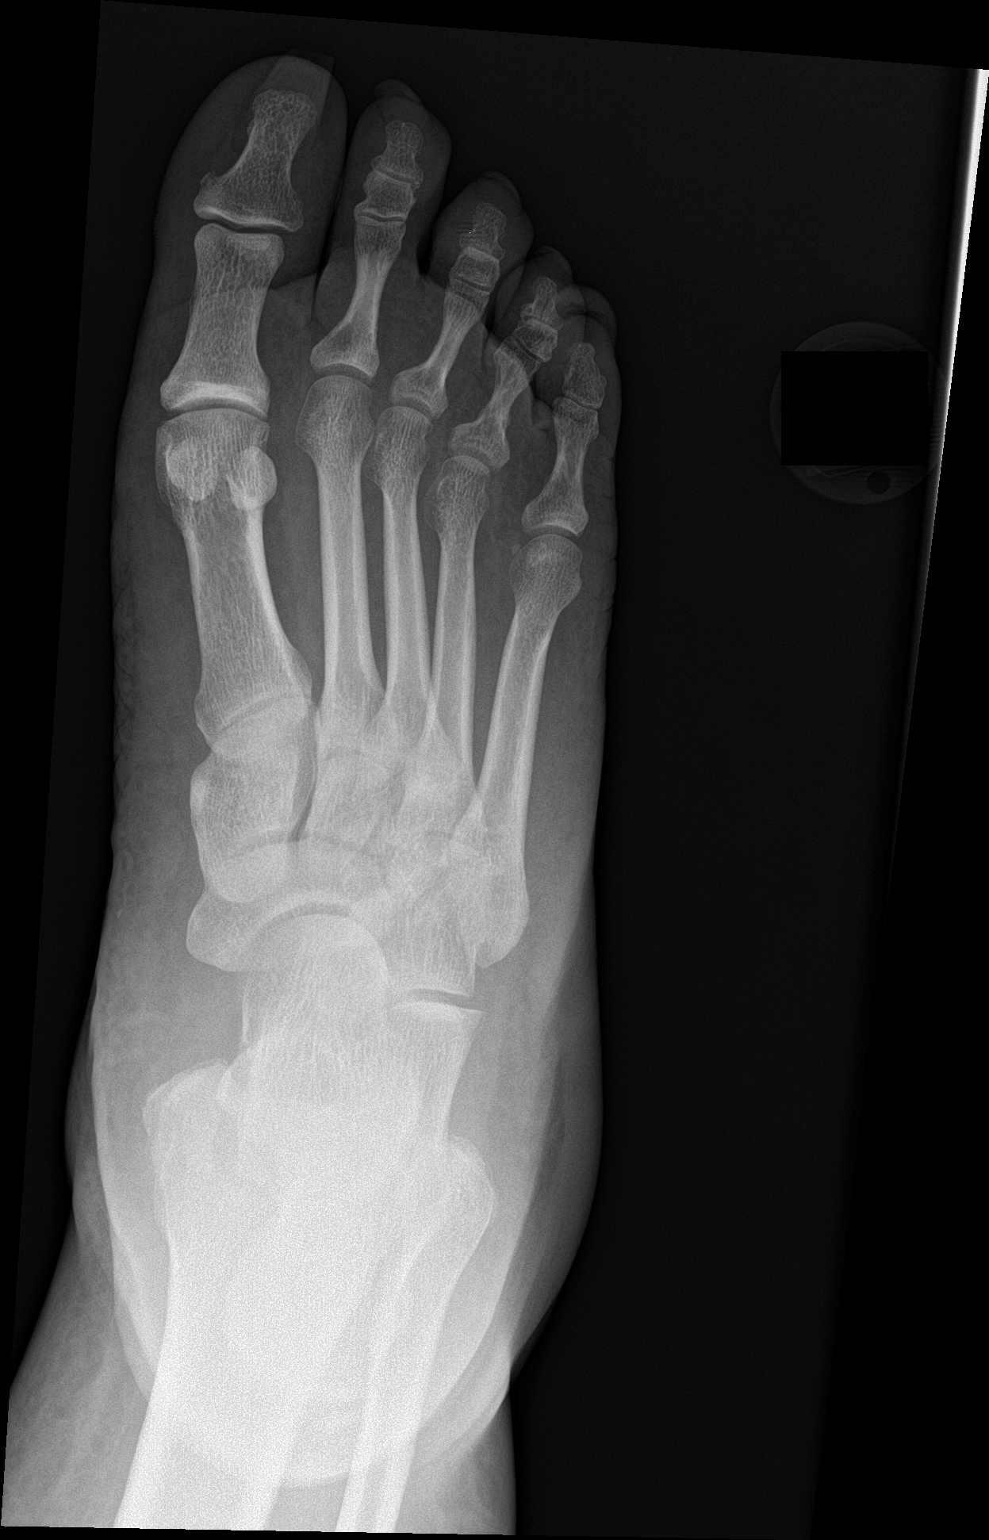

[foot obl]
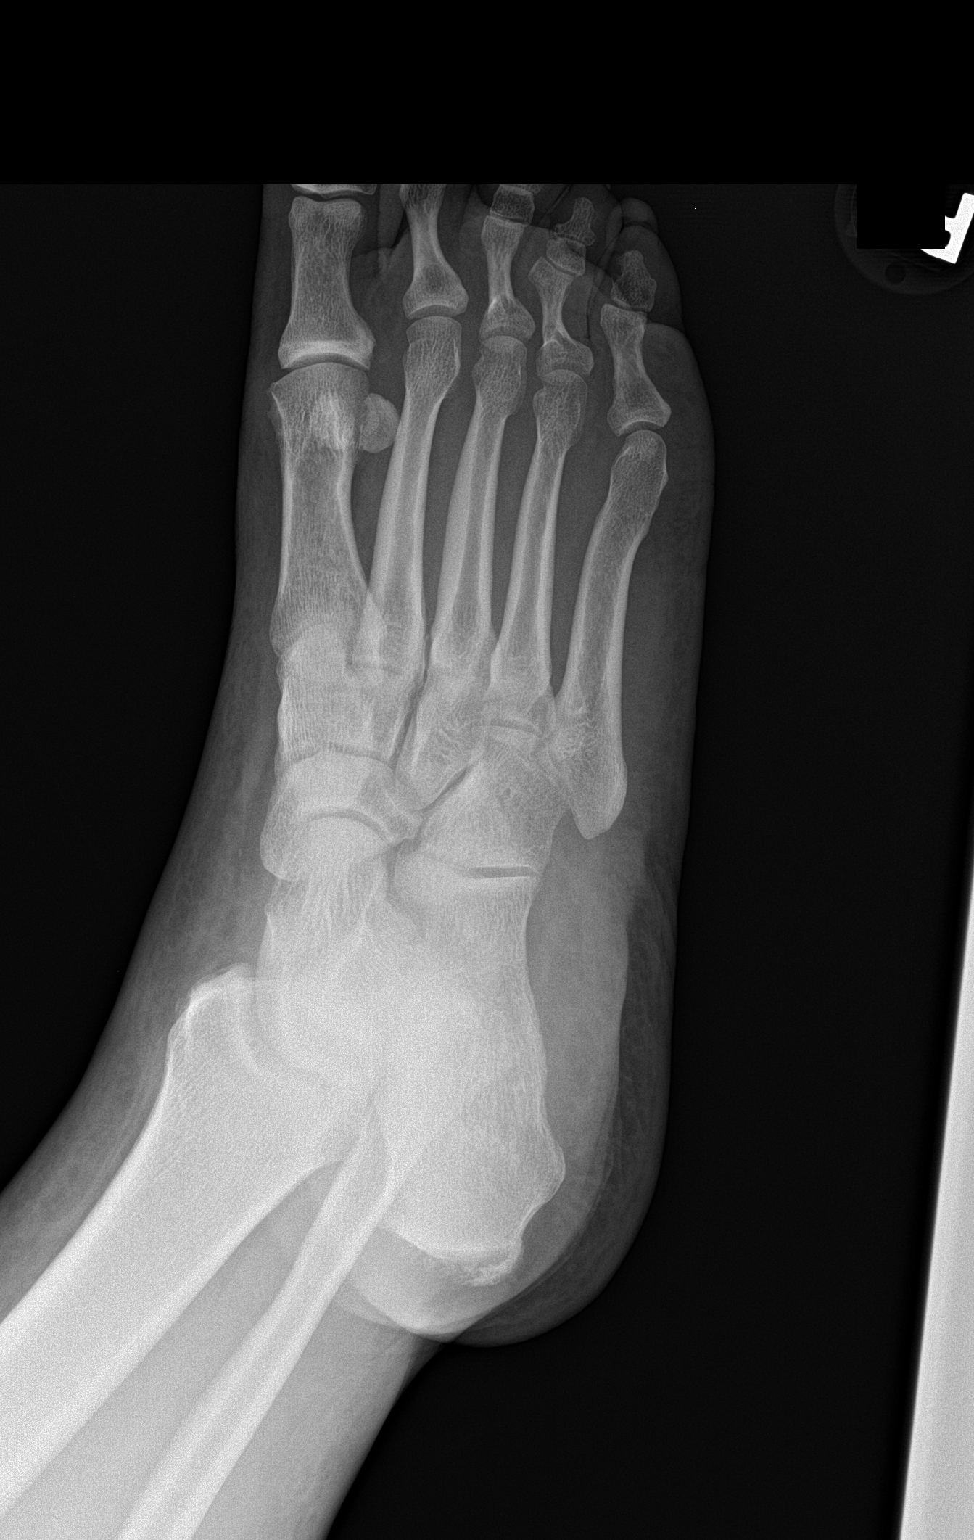

[foot lat]
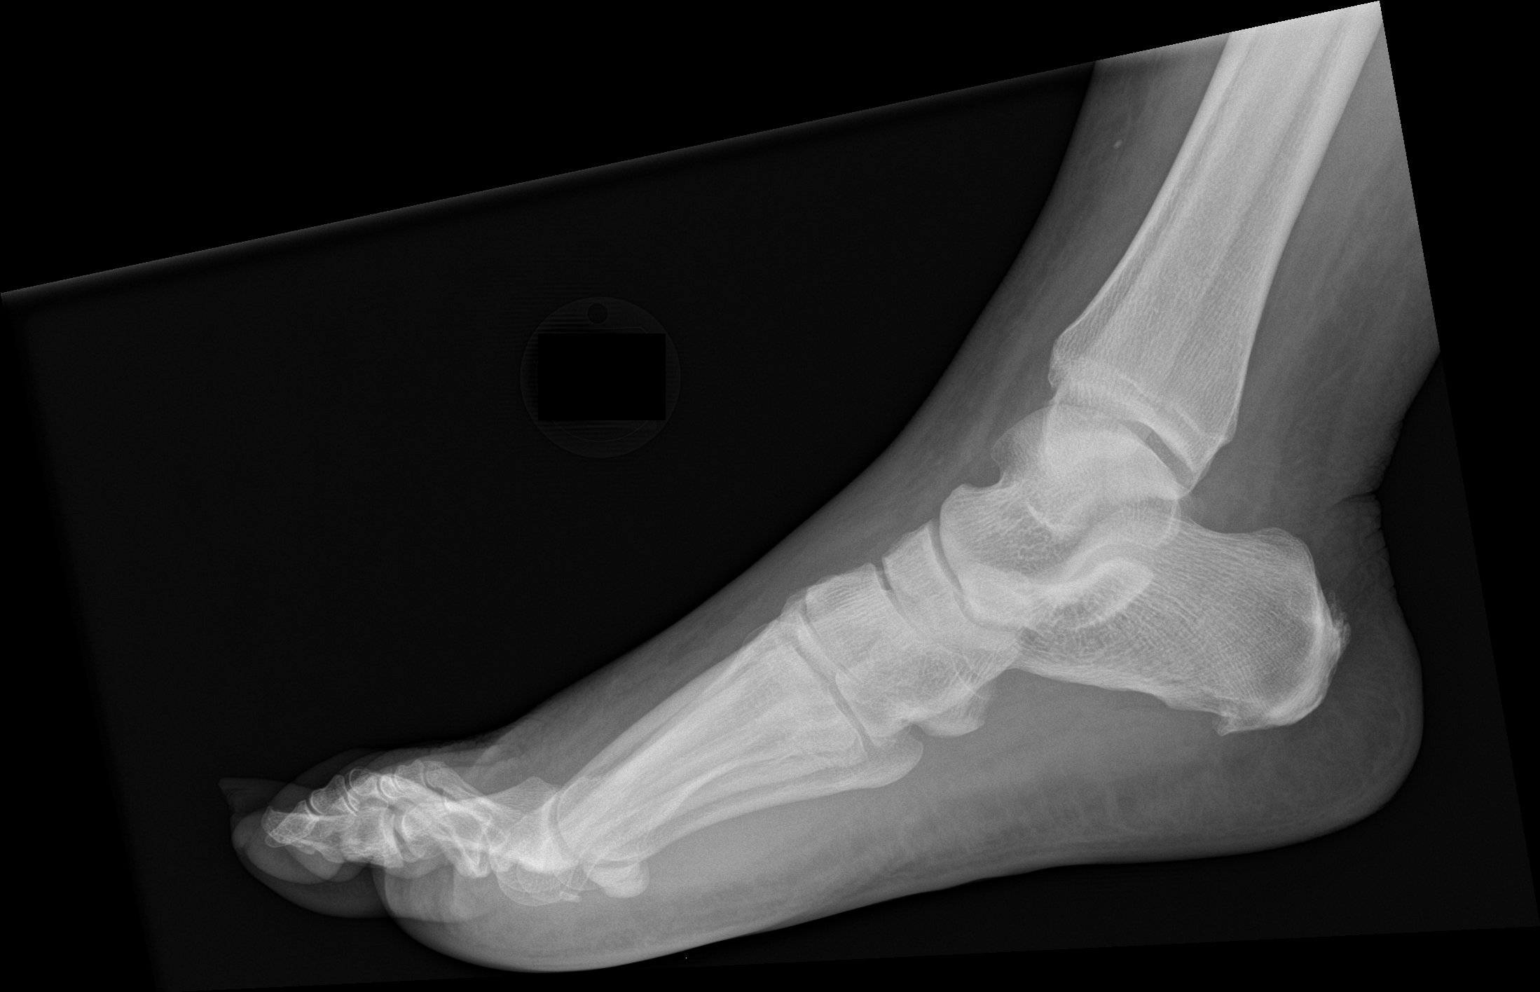

[3 of 3 positions shown; findings below may reference images not displayed]

FINDINGS: No fracture or dislocation is seen.

The joint spaces are preserved.

The visualized soft tissues are unremarkable.

Small plantar and posterior calcaneal enthesophytes.
IMPRESSION: Negative.

## 2020-12-13 IMAGING — DX DG ANKLE COMPLETE 3+V*R*
3 series · 3 of 3 positions shown · non-contrast
Comparison: None.

CLINICAL DATA: Fall, right ankle pain

EXAM:
RIGHT ANKLE - COMPLETE 3+ VIEW

[ankle ap]
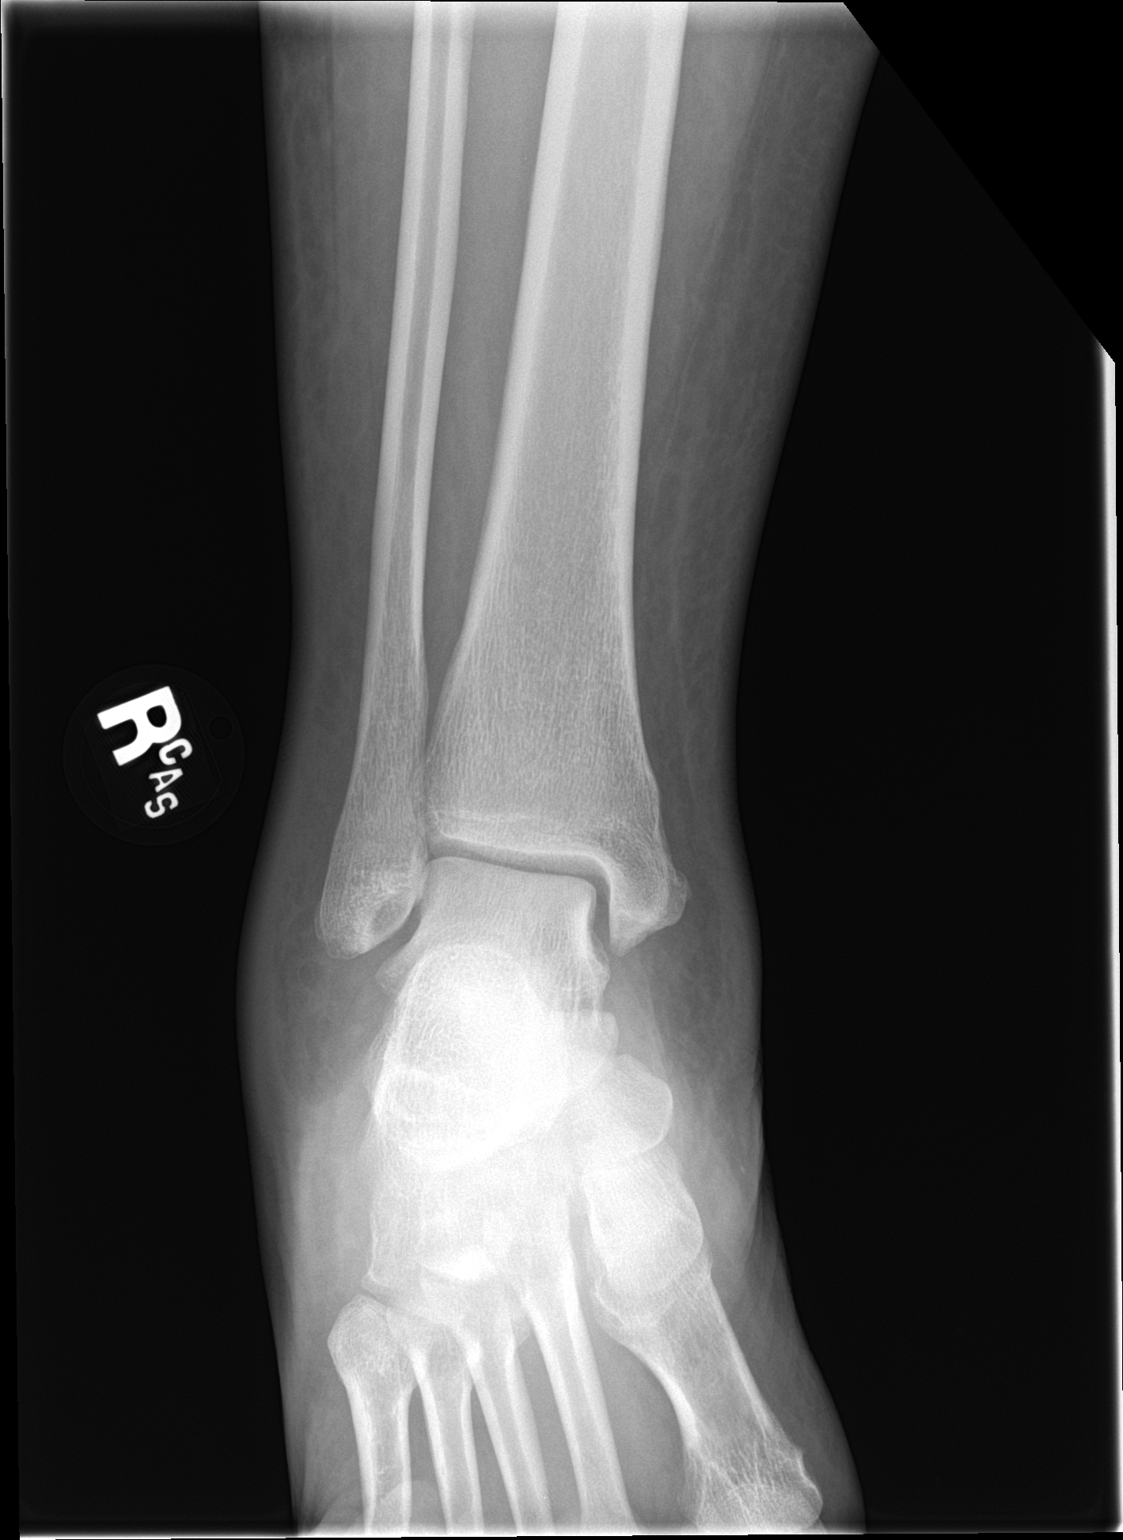

[ankle obl]
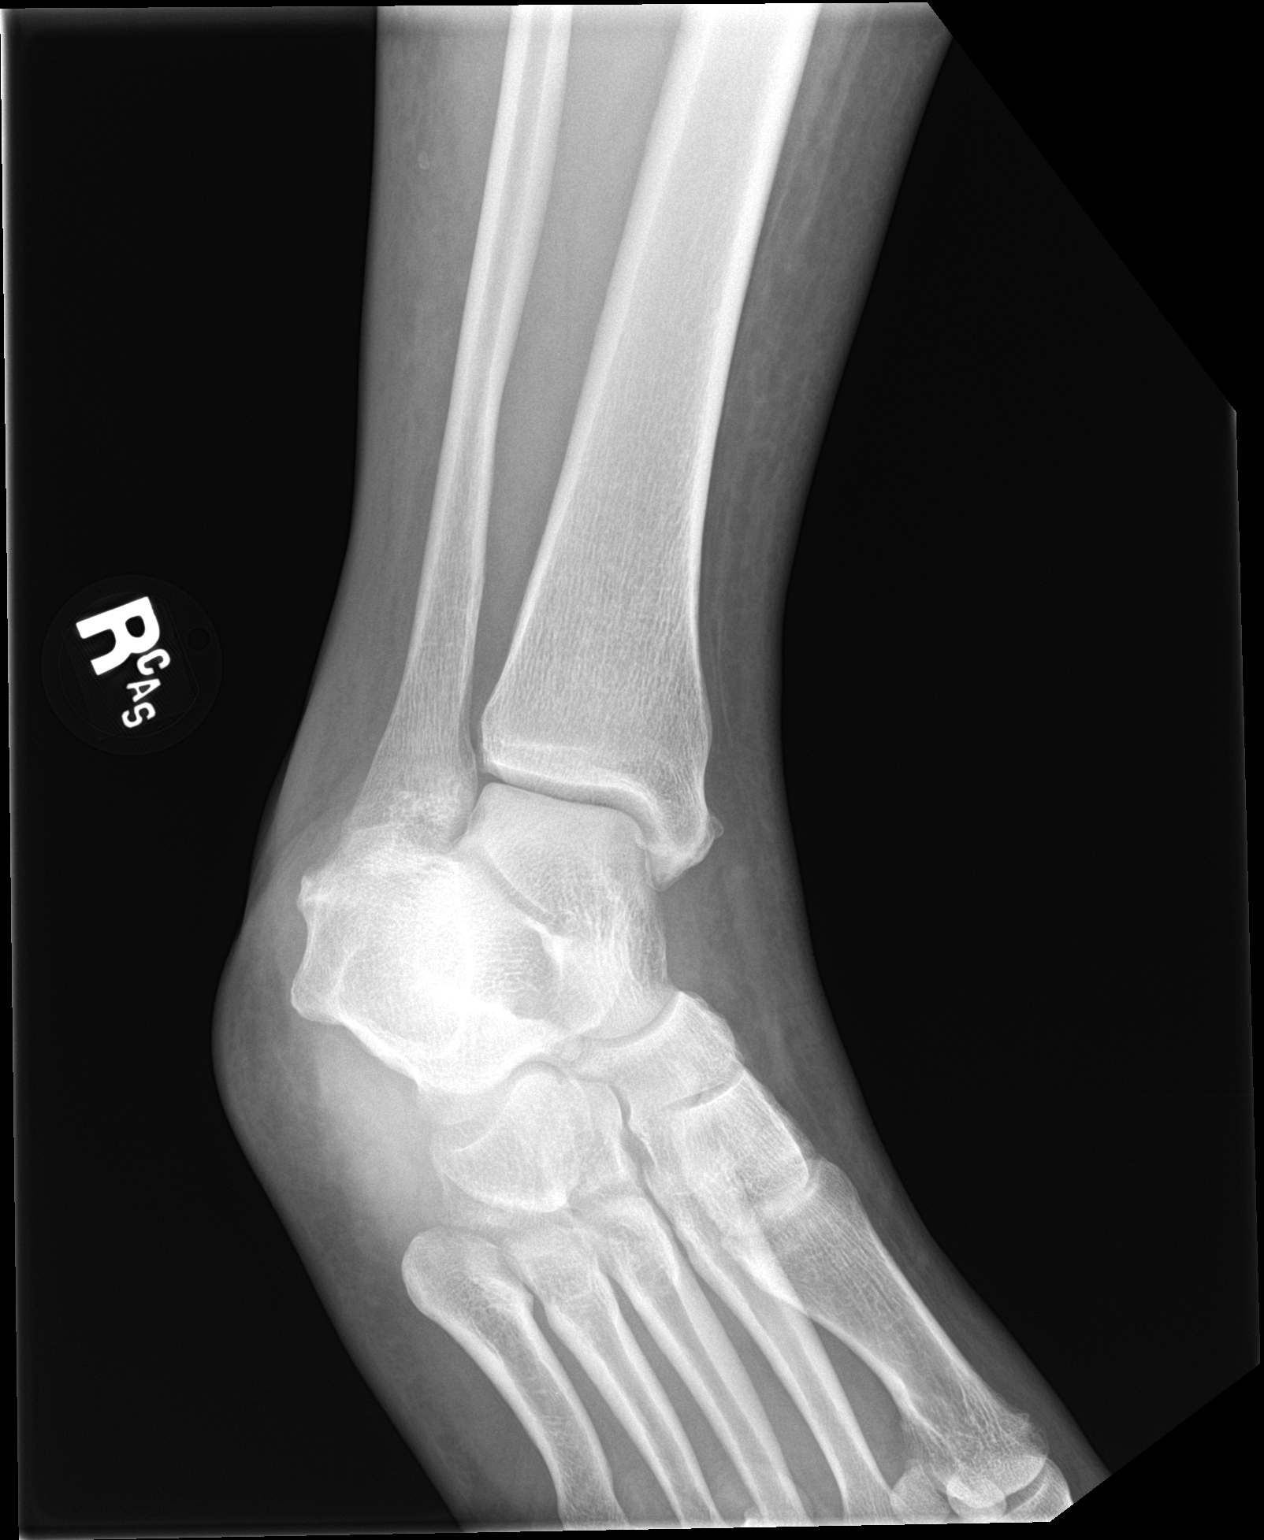

[ankle lat]
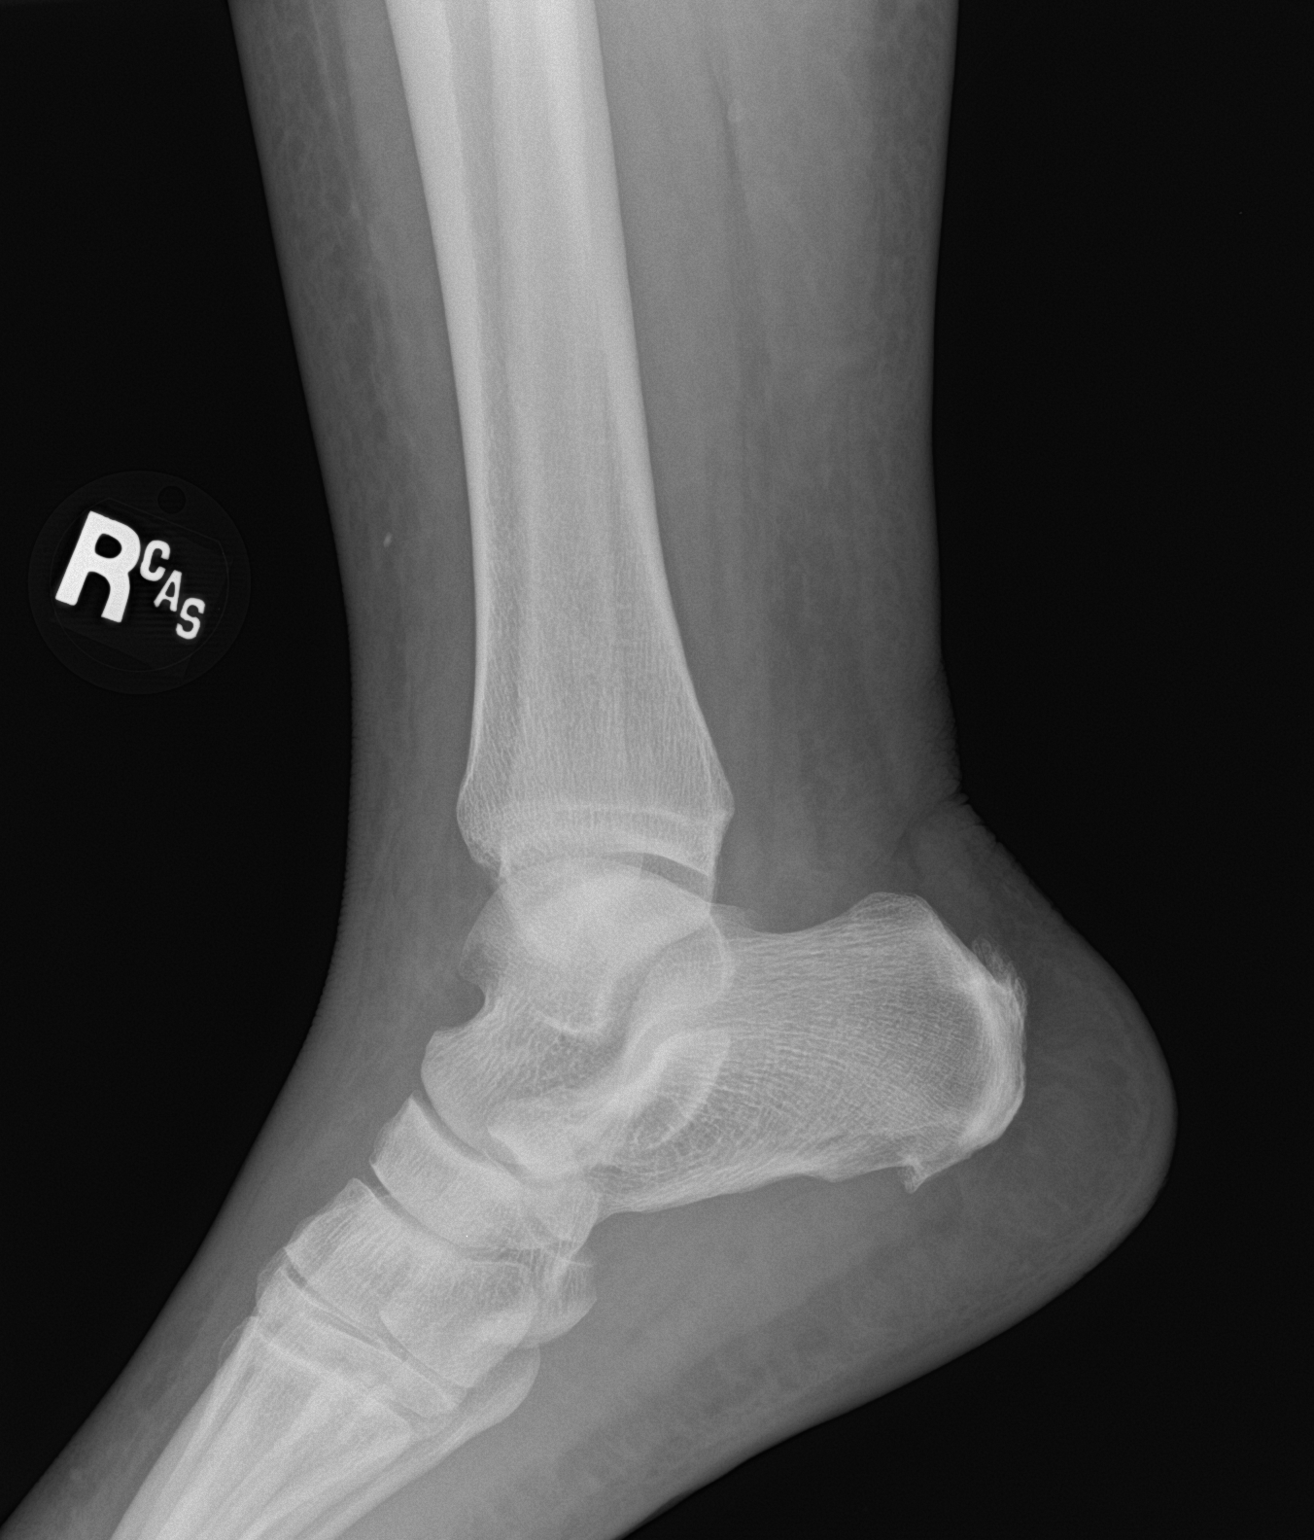

[3 of 3 positions shown; findings below may reference images not displayed]

FINDINGS: No fracture or dislocation is seen.

The ankle mortise is intact.

The base of the fifth metatarsal is unremarkable.

Small plantar and posterior calcaneal enthesophytes.

Mild diffuse ankle swelling.
IMPRESSION: No fracture or dislocation is seen.

Mild soft tissue swelling.

## 2020-12-13 IMAGING — DX DG KNEE COMPLETE 4+V*R*
4 series · 4 of 4 positions shown · non-contrast
Comparison: None.

CLINICAL DATA: 54-year-old female with fall and right lower
extremity pain.

EXAM:
RIGHT KNEE - COMPLETE 4+ VIEW

[knee ap]
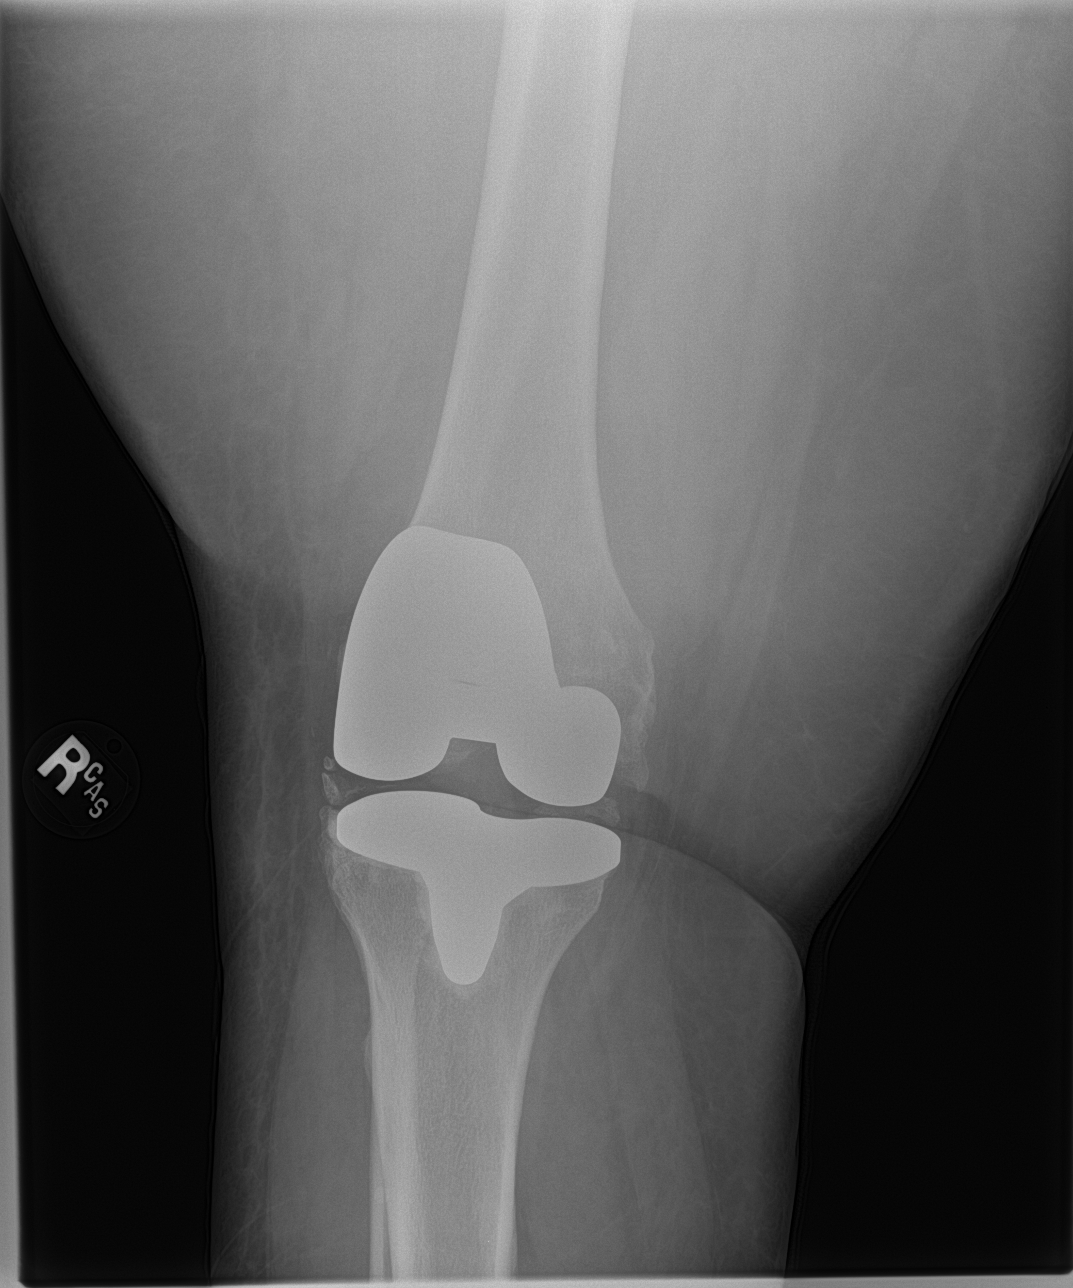

[knee obl (1 of 2)]
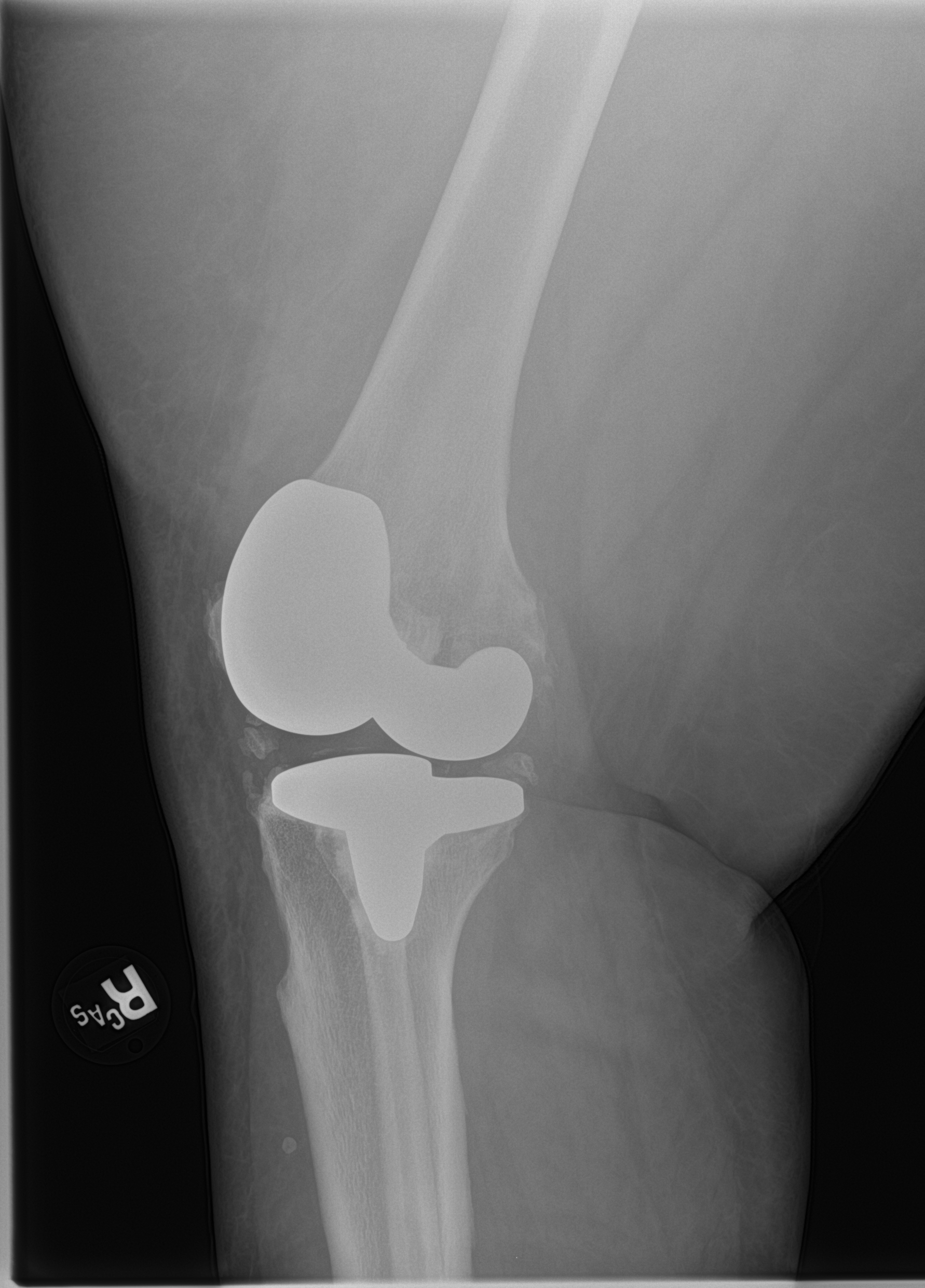

[knee lat]
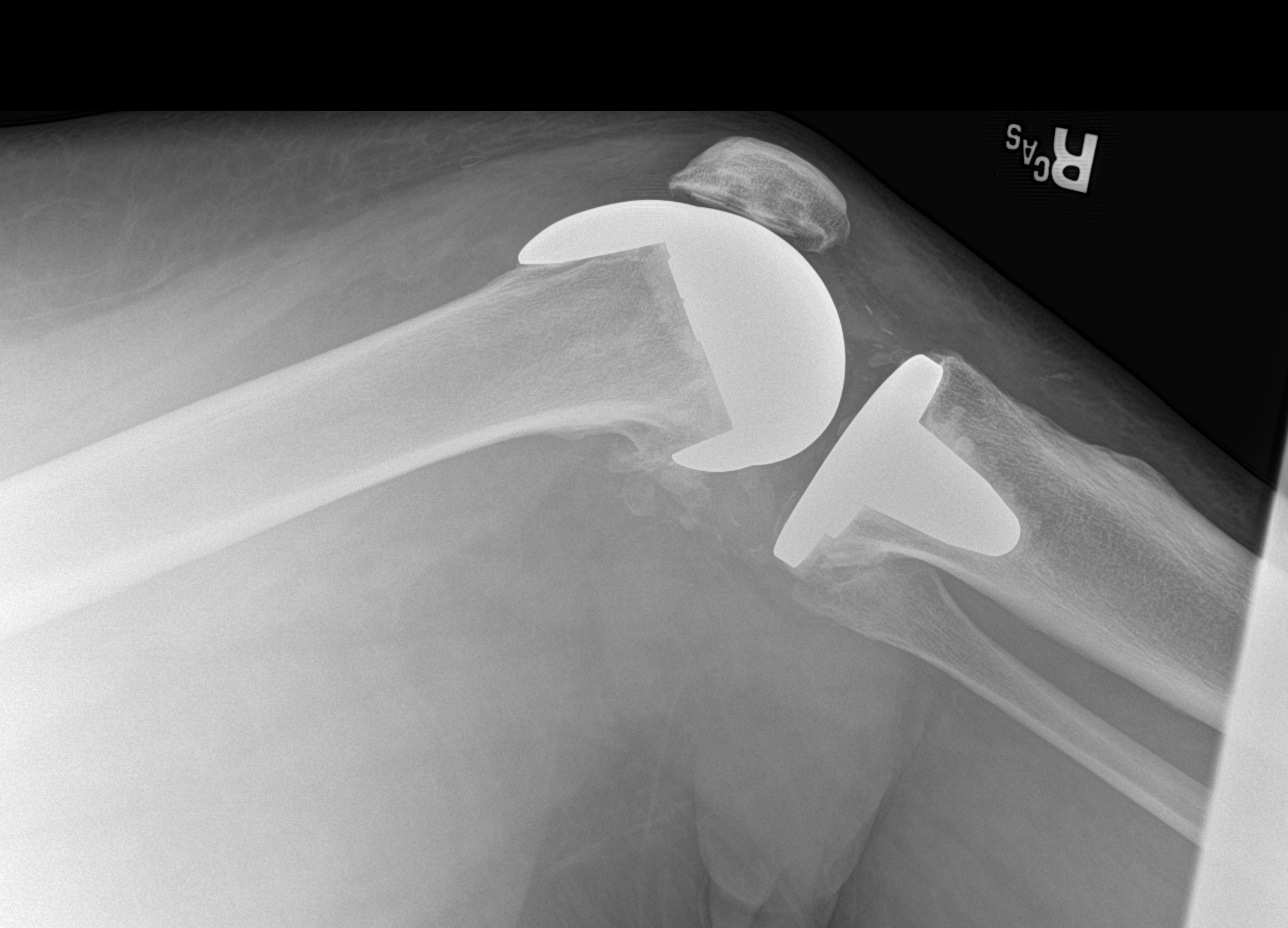

[knee obl (2 of 2)]
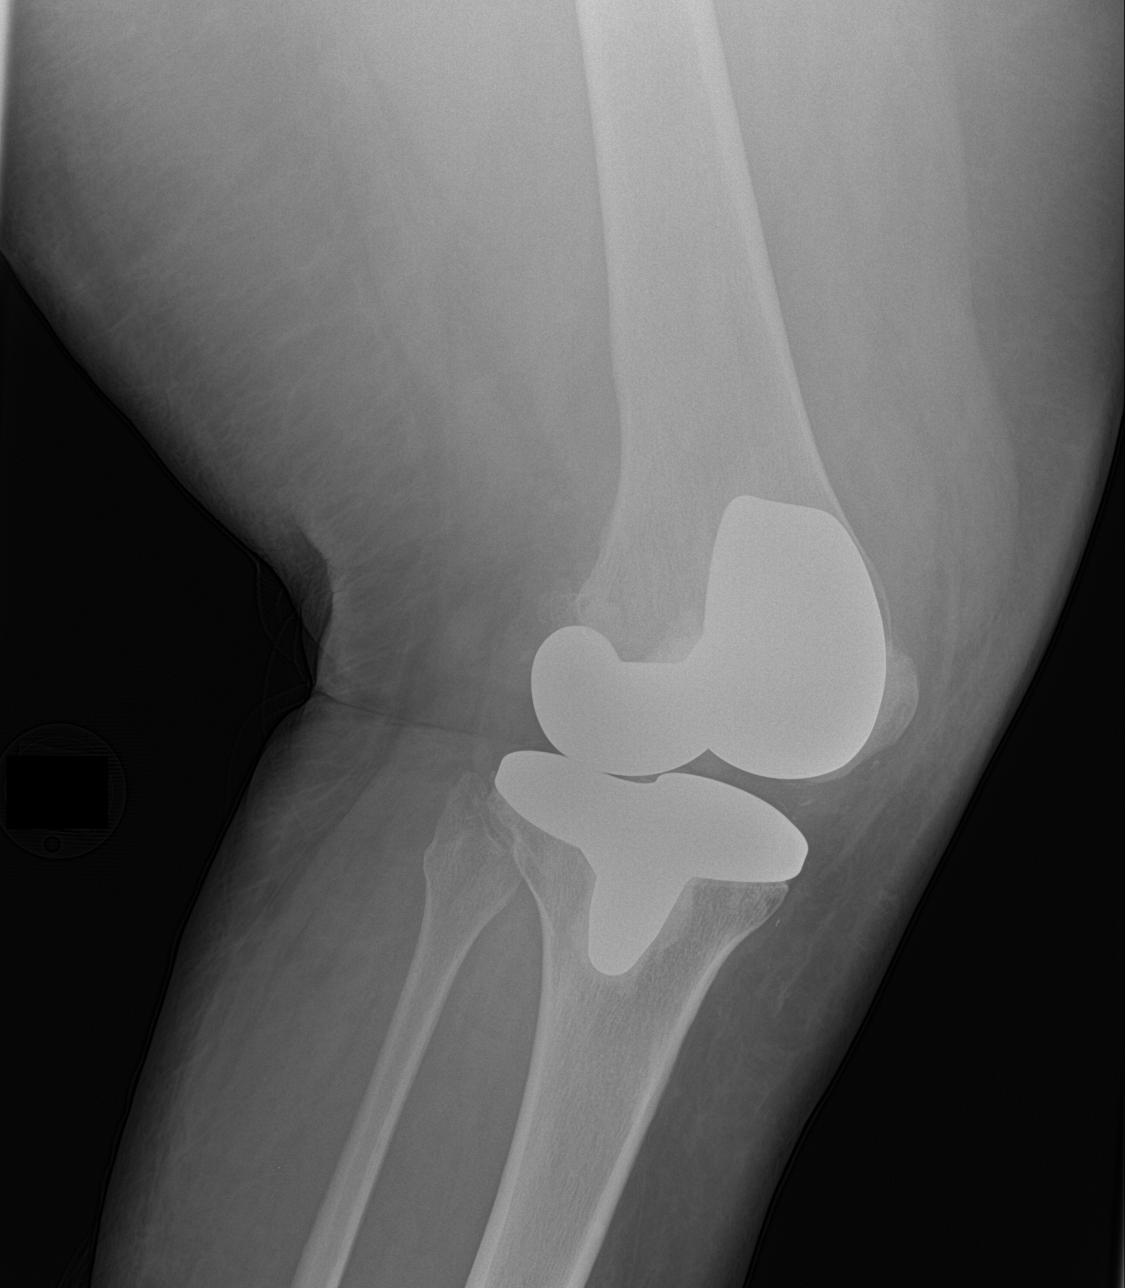

[4 of 4 positions shown; findings below may reference images not displayed]

FINDINGS: There is no acute fracture or dislocation. There is a total right
knee arthroplasty. The arthroplasty components appear intact and in
anatomic alignment. No joint effusion. The soft tissues are
unremarkable.
IMPRESSION: No acute fracture or dislocation.

## 2020-12-14 ENCOUNTER — Emergency Department: Payer: Medicaid Other

## 2020-12-14 ENCOUNTER — Encounter: Payer: Self-pay | Admitting: Emergency Medicine

## 2020-12-14 ENCOUNTER — Emergency Department
Admission: EM | Admit: 2020-12-14 | Discharge: 2020-12-14 | Disposition: A | Discharge status: Home / Self Care | Payer: Medicaid Other | Attending: Emergency Medicine | Admitting: Emergency Medicine

## 2020-12-14 ENCOUNTER — Other Ambulatory Visit: Payer: Self-pay

## 2020-12-14 DIAGNOSIS — J45909 Unspecified asthma, uncomplicated: Secondary | ICD-10-CM | POA: Diagnosis not present

## 2020-12-14 DIAGNOSIS — Z87891 Personal history of nicotine dependence: Secondary | ICD-10-CM | POA: Insufficient documentation

## 2020-12-14 DIAGNOSIS — Z96651 Presence of right artificial knee joint: Secondary | ICD-10-CM | POA: Diagnosis not present

## 2020-12-14 DIAGNOSIS — Z79899 Other long term (current) drug therapy: Secondary | ICD-10-CM | POA: Diagnosis not present

## 2020-12-14 DIAGNOSIS — I10 Essential (primary) hypertension: Secondary | ICD-10-CM | POA: Diagnosis not present

## 2020-12-14 DIAGNOSIS — M659 Synovitis and tenosynovitis, unspecified: Secondary | ICD-10-CM | POA: Diagnosis not present

## 2020-12-14 DIAGNOSIS — Z794 Long term (current) use of insulin: Secondary | ICD-10-CM | POA: Insufficient documentation

## 2020-12-14 DIAGNOSIS — Z85828 Personal history of other malignant neoplasm of skin: Secondary | ICD-10-CM | POA: Insufficient documentation

## 2020-12-14 DIAGNOSIS — M542 Cervicalgia: Secondary | ICD-10-CM | POA: Diagnosis not present

## 2020-12-14 DIAGNOSIS — E1142 Type 2 diabetes mellitus with diabetic polyneuropathy: Secondary | ICD-10-CM | POA: Diagnosis not present

## 2020-12-14 DIAGNOSIS — M6580 Other synovitis and tenosynovitis, unspecified site: Secondary | ICD-10-CM

## 2020-12-14 LAB — CBC WITH DIFFERENTIAL/PLATELET
Abs Immature Granulocytes: 0.01 10*3/uL (ref 0.00–0.07)
Basophils Absolute: 0 10*3/uL (ref 0.0–0.1)
Basophils Relative: 0 %
Eosinophils Absolute: 0.1 10*3/uL (ref 0.0–0.5)
Eosinophils Relative: 1 %
HCT: 44.2 % (ref 36.0–46.0)
Hemoglobin: 14.2 g/dL (ref 12.0–15.0)
Immature Granulocytes: 0 %
Lymphocytes Relative: 32 %
Lymphs Abs: 1.8 10*3/uL (ref 0.7–4.0)
MCH: 30.3 pg (ref 26.0–34.0)
MCHC: 32.1 g/dL (ref 30.0–36.0)
MCV: 94.4 fL (ref 80.0–100.0)
Monocytes Absolute: 0.4 10*3/uL (ref 0.1–1.0)
Monocytes Relative: 8 %
Neutro Abs: 3.2 10*3/uL (ref 1.7–7.7)
Neutrophils Relative %: 59 %
Platelets: 231 10*3/uL (ref 150–400)
RBC: 4.68 MIL/uL (ref 3.87–5.11)
RDW: 13.3 % (ref 11.5–15.5)
WBC: 5.6 10*3/uL (ref 4.0–10.5)
nRBC: 0 % (ref 0.0–0.2)

## 2020-12-14 LAB — BASIC METABOLIC PANEL
Anion gap: 10 (ref 5–15)
BUN: 11 mg/dL (ref 6–20)
CO2: 28 mmol/L (ref 22–32)
Calcium: 9.1 mg/dL (ref 8.9–10.3)
Chloride: 101 mmol/L (ref 98–111)
Creatinine, Ser: 0.71 mg/dL (ref 0.44–1.00)
GFR, Estimated: >60 mL/min (ref >60)
Glucose, Bld: 181 mg/dL — ABNORMAL HIGH (ref 70–99)
Potassium: 4.1 mmol/L (ref 3.5–5.1)
Sodium: 139 mmol/L (ref 135–145)

## 2020-12-14 MED ORDER — HYDROMORPHONE HCL 1 MG/ML IJ SOLN
0.5000 mg | Freq: Once | INTRAMUSCULAR | Status: AC
  Administered 2020-12-14: 0.5 mg via INTRAVENOUS
  Filled 2020-12-14: qty 1

## 2020-12-14 MED ORDER — IOHEXOL 300 MG/ML  SOLN
75.0000 mL | Freq: Once | INTRAMUSCULAR | Status: AC | PRN
  Administered 2020-12-14: 75 mL via INTRAVENOUS
  Filled 2020-12-14: qty 75

## 2020-12-14 MED ORDER — OXYCODONE-ACETAMINOPHEN 5-325 MG PO TABS: 1.0000 | ORAL_TABLET | ORAL | 0 refills | Status: DC | PRN

## 2020-12-14 MED ORDER — HYDROMORPHONE HCL 1 MG/ML IJ SOLN: 1.0000 mg | Freq: Once | INTRAMUSCULAR | Status: DC

## 2020-12-14 MED ORDER — METHYLPREDNISOLONE 4 MG PO TBPK: ORAL_TABLET | ORAL | 0 refills | Status: DC

## 2020-12-14 NOTE — ED Notes (Signed)
Patient to Ct at this time
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
## 2020-12-14 NOTE — Discharge Instructions (Addendum)
Take the medication as prescribed, f/u with your regular doctor, wear the soft collar for comfort

## 2020-12-14 NOTE — ED Triage Notes (Signed)
Pt to ED via POV stating that she has been having pain and numbness in her right neck and arm. Symptoms have been going on for a while. Pt has seen her PCP and is supposed to be getting scheduled for a MRI. Pt states that yesterday she started having pain on the left side, painful to swallow. Pt reports that she feels like the pain from the left side is putting more pressure on the right side of her neck. Pt states that she can barely turn her head. Pt is in no acute distress at this time.

## 2020-12-22 LAB — HM DIABETES EYE EXAM

## 2020-12-23 ENCOUNTER — Encounter: Payer: Medicaid Other | Admitting: Obstetrics and Gynecology

## 2020-12-25 ENCOUNTER — Ambulatory Visit: Payer: Medicaid Other | Attending: Nurse Practitioner

## 2020-12-30 ENCOUNTER — Encounter: Payer: Self-pay | Admitting: Obstetrics and Gynecology

## 2020-12-30 ENCOUNTER — Other Ambulatory Visit: Payer: Self-pay

## 2020-12-30 ENCOUNTER — Ambulatory Visit (INDEPENDENT_AMBULATORY_CARE_PROVIDER_SITE_OTHER): Payer: Medicaid Other | Admitting: Obstetrics and Gynecology

## 2020-12-30 ENCOUNTER — Other Ambulatory Visit (HOSPITAL_COMMUNITY)
Admission: RE | Admit: 2020-12-30 | Discharge: 2020-12-30 | Disposition: A | Discharge status: Home / Self Care | Payer: Medicaid Other | Source: Ambulatory Visit | Attending: Obstetrics and Gynecology | Admitting: Obstetrics and Gynecology

## 2020-12-30 DIAGNOSIS — Z794 Long term (current) use of insulin: Secondary | ICD-10-CM

## 2020-12-30 DIAGNOSIS — N924 Excessive bleeding in the premenopausal period: Secondary | ICD-10-CM | POA: Diagnosis not present

## 2020-12-30 DIAGNOSIS — E1165 Type 2 diabetes mellitus with hyperglycemia: Secondary | ICD-10-CM | POA: Diagnosis not present

## 2020-12-30 DIAGNOSIS — G4733 Obstructive sleep apnea (adult) (pediatric): Secondary | ICD-10-CM

## 2020-12-30 DIAGNOSIS — D251 Intramural leiomyoma of uterus: Secondary | ICD-10-CM

## 2020-12-30 DIAGNOSIS — Z6841 Body Mass Index (BMI) 40.0 and over, adult: Secondary | ICD-10-CM

## 2020-12-30 NOTE — Progress Notes (Signed)
Pt present for endometerial bx. Pt stated that she was doing well.

## 2020-12-30 NOTE — Progress Notes (Unsigned)
GYNECOLOGY PROGRESS NOTE  Subjective:    Patient ID: Brenda Hicks, female    DOB: [birth year], 55 y.o.   MRN: [id number]  HPI  Patient is a 56 y.o. [id number] female who presents for endometrial biopsy and further discussion of surgery/preoperative examination.  She currently desires surgical management of her perimenopausal bleeding and fibroid uterus.  She has been dealing with this issue for several years, and has been denied option of definitive management with hysterectomy in the past due to her medical comorbidities.  After discussion with with patient last week on alternative options she has decided on proceeding with endometrial ablation with possible hysteroscopic myomectomy.  She denies complaints today.  The following portions of the patient's history were reviewed and updated as appropriate: allergies, current medications, past family history, past medical history, past social history, past surgical history and problem list.  Review of Systems Pertinent items noted in HPI and remainder of comprehensive ROS otherwise negative.   Objective:   Blood pressure 121/86, pulse 91, height 5\' 6"  (1.676 m), weight (!) 318 lb 12.8 oz (144.6 kg), last menstrual period 11/23/2020. General appearance: alert and no distress Abdomen: soft, non-tender; bowel sounds normal; no masses,  no organomegaly Pelvic: external genitalia normal, rectovaginal septum normal.  Vagina without discharge.  Cervix normal appearing, no lesions and no motion tenderness.  Uterus and adnexae difficult to assess due to body habitus.  Extremities: extremities normal, atraumatic, no cyanosis or edema Neurologic: Grossly normal    Labs:  Lab Results  Component Value Date   WBC 5.6 12/14/2020   HGB 14.2 12/14/2020   HCT 44.2 12/14/2020   MCV 94.4 12/14/2020   PLT 231 12/14/2020    Lab Results  Component Value Date   TSH 2.91 08/07/2019    Last pap smear: 3/9/32020.   Imaging:   Patient Name: Brenda Hicks DOB: 1966-06-03 MRN: [id number] ULTRASOUND REPORT  Location: [facility]  Date of Service: 11/06/2019   Indications:Abnormal Uterine Bleeding Findings:  The uterus is anteverted and measures 10.8 x 6.7 x 5.9cm. Echo texture is heterogenous with evidence of focal mass. Within the uterus there is one suspected fibroid measuring: Fibroid 1:  3.8 x 2.7 x 2.6cm (IM, LT)  The Endometrium is thickened and heterogeneous measuring 13.8 mm. IUD is not seen.   Bilateral ovaries are not seen due to body habitus. Survey of the adnexa demonstrates no adnexal masses. There is no free fluid in the cul de sac.  Impression: 1. Intramural fibroid again seen 3.8cm. 2. Thickened and heterogeneous endometrial canal. IUD is not seen.   Recommendations: 1.Clinical correlation with the patient's History and Physical Exam. 2. Correlate endometrial thickness with menstrual history. If postmenopausal biopsy would be recommended.  3. IUD not seen, abdominal and pelvic x-ray recommended .    Vita Barley, RT   I have reviewed this ultrasound and the report. I agree with the above assessment and plan.  [facility] 11/06/19 11:03 AM   Assessment:   1. Abnormal perimenopausal bleeding   2. Intramural leiomyoma of uterus   3. Morbid obesity with BMI of 50.0-59.9, adult ([place])   4. Type 2 diabetes mellitus with hyperglycemia, with long-term current use of insulin (HCC)   5. Obstructive sleep apnea syndrome    Plan:   - Patient desires surgical management with Hysteroscopy D&C, possible myomectomy, and endometrial ablation.  The risks of surgery were discussed in detail with the patient including  but not limited to: bleeding which may require transfusion or reoperation; infection which may require prolonged hospitalization or re-hospitalization and antibiotic therapy; injury to bowel, bladder, and major vessels or other surrounding  organs; formation of adhesions; need for additional procedures including laparotomy or subsequent procedures secondary to abnormal pathology; thromboembolic phenomenon; incisional problems and other postoperative or anesthesia complications.  Patient was told that the likelihood that her condition and symptoms will be treated effectively with this surgical management was very high; the postoperative expectations were also discussed in detail. The patient also understands the alternative treatment options which were discussed in full. All questions were answered.  She was told that she will be contacted by our surgical scheduler regarding the time and date of her surgery; routine preoperative instructions will be given to her by the preoperative nursing team.   She is aware of need for preoperative COVID testing and subsequent quarantine from time of test to time of surgery; she will be given further preoperative instructions at that [place] screening visit.  Printed patient education handouts about the procedure were given to the patient to review at home. Surgery scheduled for 2/28.  - Endometrial biopsy performed today for workup of bleeding.  Will also have patient return for scheduled ultrasound to reassess uterine fibroid.    Endometrial Biopsy Procedure Note  The patient is positioned on the exam table in the dorsal lithotomy position. Bimanual exam confirms uterine position and size. A Graves speculum is placed into the vagina. A single toothed tenaculum is placed onto the anterior lip of the cervix. The pipette is placed into the endocervical canal and is advanced to the uterine fundus. Using a piston like technique, with vacuum created by withdrawing the stylus, the endometrial specimen is obtained and transferred to the biopsy container. Minimal bleeding is encountered. The procedure is well tolerated.   Uterine Position: mid    Uterine Length: 9 cm   Uterine Specimen: Average   Post procedure  instructions are given. The patient is scheduled for follow up appointment.    Rubie Maid, MD [facility]

## 2020-12-30 NOTE — Patient Instructions (Signed)
You are scheduled for surgery on 01/19/2021.  Nothing to eat after midnight on day prior to surgery.  Do not take any medications unless recommended by your provider on day prior to surgery.  Do not take NSAIDs (Motrin, Aleve) or aspirin 7 days prior to surgery.  You may take Tylenol products for minor aches and pains.  You will receive a prescription for pain medications post-operatively.  You will be contacted by phone approximately 1 week prior to surgery to schedule pre-operative appointment.  Please call the office if you have any questions regarding your upcoming surgery.        COVID 19 Instructions for Scheduled Procedure (Inductions/C-sections and GYN surgeries)   Thank you for choosing [facility] for your services.  You have been scheduled for a procedure called _____Endometrial ablation____________.    Your procedure is scheduled on ______Monday, February 28, 2022_______.  You are required to have COVID-19 testing performed 2 days prior to your scheduled procedure date.  Testing is performed between 9 AM and 1 PM Monday through Friday.  Please present for testing on ___Friday, February 25, 2022_________ during this hour. Drive up testing is performed in front of the [place] (this is next to the [employer]).    Upon your scheduled procedure date, you will need to arrive at the [place] entrance. (There is a statue at the front of this entrance.)   Please arrive on time if you are scheduled for an induction of labor.   If you are scheduled for a Cesarean delivery or for Gyn Surgery, arrive 2 hours prior to your procedure time.   If you are an Obstetric patient and your arrival time falls between 11 PM and 6 AM call L&D [phone]) when you arrive.  A staff member will meet you at the [place] entrance.  At this time, patients are allowed 1 support person to accompany them. Face masks are required for you and your support person. Your support  person is now allowed to be there with you during the entire time of your admission.   Please contact the office if you have any questions regarding this information.  The [facility] office number is [phone].     Thank you,    Your [facility]

## 2021-01-01 ENCOUNTER — Encounter: Payer: Self-pay | Admitting: Obstetrics and Gynecology

## 2021-01-01 LAB — SURGICAL PATHOLOGY

## 2021-01-01 NOTE — H&P (Signed)
GYNECOLOGY PREOPERATIVE HISTORY AND PHYSICAL  Subjective:  Brenda Hicks is a 55 y.o. [id number] here for surgical management of abnormal perimenopausal bleeding and uterine fibroid.Brenda Hicks reports that she has had problems with her cycles since she was a teen. Cycles are often irregular and heavy. Has used Depo Provera, IUD (1st one worked, second one with second one expelled). She notes that after the birth of her last child and tubal ligation in 1991, that her cycles have progressively worsened. Sometimes uses pads, tampons, and diapers simultaneously to control her bleeding. She even notes sometimes having severe cramping without a cycle. Significant preoperative concerns include morbid obesity, h/o sleep apnea, and type II DM.  Proposed surgery: Hysteroscopy D&C, myomectomy, and endometrial ablation.     Pertinent Gynecological History: Menses: irregular occuring and heavy Bleeding: dysfunctional uterine bleeding Contraception: tubal ligation Last pap: normal Date: 12/12/2019.    Past Medical History:  Diagnosis Date  . Anemia    vitamin d deficiency  . Arthritis   . Asthma    WELL CONTROLLED  . 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 Diabetes mellitus without complication ([place])   . Fatty liver   . Hypertension   . Kidney cysts    per patient, never had  . Renal disorder   . Sleep apnea    USES CPAP. waiting for new machine and a new sleep study  . Stroke [facility]) May or June 2019   TIA. no residual symptoms   Past Surgical History:  Procedure Laterality Date  . ANTERIOR CERVICAL DECOMP/DISCECTOMY FUSION N/A 08/09/2018   Procedure: ANTERIOR CERVICAL DECOMPRESSION/DISCECTOMY FUSION 1 LEVEL- C4-5;  Surgeon: Meade Maw, MD;  Location: [facility] ORS;  Service: Neurosurgery;  Laterality: N/A;  . BACK SURGERY    . DILATION AND CURETTAGE OF UTERUS    . ENDOMETRIAL BIOPSY     benign  . EXPLORATORY LAPAROTOMY  1992   REMOVAL OF RUPTURED ECTOPIC  . HAND SURGERY Right  1998   cyst removed  . HERNIA REPAIR  1941   UMBILICAL  . JOINT REPLACEMENT Right 2014   TKR  . KNEE ARTHROSCOPY Right 2012  . KNEE SURGERY Right 2014   total knee replacement  . SHOULDER ARTHROSCOPY WITH BICEPSTENOTOMY Left 12/14/2016   Procedure: SHOULDER ARTHROSCOPY WITH BICEPSTENOTOMY;  Surgeon: Corky Mull, MD;  Location: [facility] ORS;  Service: Orthopedics;  Laterality: Left;  . SHOULDER ARTHROSCOPY WITH OPEN ROTATOR CUFF REPAIR Left 12/14/2016   Procedure: SHOULDER ARTHROSCOPY WITH OPEN ROTATOR CUFF REPAIR AND ARTHROSCOPIC ROTATOR CUFF REPAIR;  Surgeon: Corky Mull, MD;  Location: [facility] ORS;  Service: Orthopedics;  Laterality: Left;  . SHOULDER ARTHROSCOPY WITH ROTATOR CUFF REPAIR Right 01/04/2019   Procedure: SHOULDER ARTHROSCOPY WITH ROTATOR CUFF REPAIR;  Surgeon: Corky Mull, MD;  Location: [facility] OR[facility];  Service: Orthopedics;  Laterality: Right;  . SHOULDER ARTHROSCOPY WITH SUBACROMIAL DECOMPRESSION Left 12/14/2016   Procedure: SHOULDER ARTHROSCOPY WITH SUBACROMIAL DECOMPRESSION;  Surgeon: Corky Mull, MD;  Location: [facility] ORS;  Service: Orthopedics;  Laterality: Left;  . SHOULDER ARTHROSCOPY WITH SUBACROMIAL DECOMPRESSION AND BICEP TENDON REPAIR Right 01/04/2019   Procedure: SHOULDER ARTHROSCOPY WITH DEBRIDEMENT AND SUBACROMIAL DECOMPRESSION-RIGHT;  Surgeon: Corky Mull, MD;  Location: [facility] ORS;  Service: Orthopedics;  Laterality: Right;  . TUBAL LIGATION     OB History  Gravida Para Term Preterm AB Living  7 6 5 1 1 6   SAB IAB Ectopic Multiple Live Births  1   6    # Outcome Date GA Lbr Len/2nd Weight Sex Delivery Anes PTL Lv  7 Ectopic 1991          6 Term 11/11/89   8 lb 4 oz (3.742 kg) M Vag-Spont  N LIV  5 Term 03/10/88   7 lb 2 oz (3.232 kg) M Vag-Spont  N LIV  4 Term 02/15/87   6 lb (2.722 kg) M Vag-Spont  N LIV  3 Term 01/02/86   5 lb 10 oz (2.551 kg)  Vag-Spont  N LIV     Complications: Dysfunctional Labor  2 Preterm 12/31/84   4 lb 11 oz (2.126 kg) M Vag-Spont  Y  LIV  1 Term 03/05/84   6 lb 7 oz (2.92 kg) M Vag-Spont  N LIV    Family History  Problem Relation Age of Onset  . Hypertension Mother   . Thyroid disease Mother   . Lupus Mother   . Congestive Heart Failure Mother   . CAD Maternal Grandmother   . Breast cancer Neg Hx   . Ovarian cancer Neg Hx   . Colon cancer Neg Hx    Social History   Socioeconomic History  . Marital status: Married    Spouse name: brian  . Number of children: 7  . Years of education: Not on file  . Highest education level: Not on file  Occupational History  . Occupation: disabled    Comment: disabled  Tobacco Use  . Smoking status: Former Smoker    Packs/day: 2.00    Years: 25.00    Pack years: 50.00    Types: Cigarettes    Quit date: 06/09/2017    Years since quitting: 3.5  . Smokeless tobacco: Never Used  Vaping Use  . Vaping Use: Never used  Substance and Sexual Activity  . Alcohol use: No  . Drug use: Yes    Types: Marijuana    Comment: Pt. uses marjiuana only when she has severe back and side pain  . Sexual activity: Not Currently    Partners: Male  Other Topics Concern  . Not on file  Social History Narrative   Have 6 biological childrean and custody of niece since 32 months.   Social Determinants of Health   Financial Resource Strain: Not on file  Food Insecurity: Not on file  Transportation Needs: Not on file  Physical Activity: Not on file  Stress: Not on file  Social Connections: Not on file  Intimate Partner Violence: Not on file   Current Outpatient Medications on File Prior to Visit  Medication Sig Dispense Refill  . albuterol (VENTOLIN HFA) 108 (90 Base) MCG/ACT inhaler Inhale 2 puffs into the lungs every 6 (six) hours as needed for wheezing or shortness of breath. 18 g 3  . atorvastatin (LIPITOR) 20 MG tablet Take 1 tablet (20 mg total) by mouth daily. 90 tablet 3  . cyclobenzaprine (FLEXERIL) 10 MG tablet Take 1 tablet (10 mg total) by mouth 3 (three) times daily as  needed. 30 tablet 1  . Exenatide ER (BYDUREON) 2 MG PEN Inject into the skin.    Marland Kitchen gabapentin (NEURONTIN) 300 MG capsule Take 1 capsule (300 mg total) by mouth 3 (three) times daily. 90 capsule 1  . insulin glargine (LANTUS) 100 UNIT/ML injection Inject 1 mL (100 Units total) into the skin daily. 10 mL 3  . insulin lispro (HUMALOG) 100 UNIT/ML injection Inject 0.3 mLs (30 Units total) into the skin 3 (three)  times daily before meals. 10 mL 1  . Insulin Syringe-Needle U-100 (INSULIN SYRINGE 1CC/31GX5/16") 31G X 5/16" 1 ML MISC     . ketorolac (TORADOL) 10 MG tablet Take 1 tablet (10 mg total) by mouth every 8 (eight) hours. 15 tablet 0  . lisinopril-hydrochlorothiazide (ZESTORETIC) 20-12.5 MG tablet Take 2 tablets by mouth daily. 90 tablet 3  . naproxen sodium (ALEVE) 220 MG tablet Take 220 mg by mouth daily as needed.    Marland Kitchen oxyCODONE-acetaminophen (PERCOCET) 5-325 MG tablet Take 1 tablet by mouth every 4 (four) hours as needed for severe pain. 20 tablet 0  . promethazine (PHENERGAN) 25 MG tablet Take 1 tablet (25 mg total) by mouth every 6 (six) hours as needed for nausea or vomiting. 20 tablet 0  . QUEtiapine (SEROQUEL) 50 MG tablet Take 50 mg by mouth at bedtime.    . traZODone (DESYREL) 50 MG tablet Take 1 tablet (50 mg total) by mouth at bedtime as needed for sleep. 30 tablet 1  . venlafaxine (EFFEXOR) 37.5 MG tablet Take by mouth.    . Vitamin D, Ergocalciferol, (DRISDOL) 1.25 MG (50000 UT) CAPS capsule Take 50,000 Units by mouth every Monday.     No current facility-administered medications on file prior to visit.   Allergies  Allergen Reactions  . Morphine And Related Nausea And Vomiting  . Tramadol     Nausea. If she takes antinausea medicine with this medicine, then she can tolerate it.      Review of Systems Constitutional: No recent fever/chills/sweats Respiratory: No recent cough/bronchitis Cardiovascular: No chest pain Gastrointestinal: No recent  nausea/vomiting/diarrhea Genitourinary: No UTI symptoms Hematologic/lymphatic:No history of coagulopathy or recent blood thinner use    Objective:   Blood pressure 121/86, pulse 91, height 5\' 6"  (1.676 m), weight (!) 318 lb 12.8 oz (144.6 kg), last menstrual period 11/23/2020. CONSTITUTIONAL: Well-developed, well-nourished female in no acute distress.  HENT:  Normocephalic, atraumatic, External right and left ear normal. Oropharynx is clear and moist EYES: Conjunctivae and EOM are normal. Pupils are equal, round, and reactive to light. No scleral icterus.  NECK: Normal range of motion, supple, no masses SKIN: Skin is warm and dry. No rash noted. Not diaphoretic. No erythema. No pallor. NEUROLOGIC: Alert and oriented to person, place, and time. Normal reflexes, muscle tone coordination. No cranial nerve deficit noted. PSYCHIATRIC: Normal mood and affect. Normal behavior. Normal judgment and thought content. CARDIOVASCULAR: Normal heart rate noted, regular rhythm RESPIRATORY: Effort and breath sounds normal, no problems with respiration noted ABDOMEN: Soft, nontender, nondistended. PELVIC: Deferred MUSCULOSKELETAL: Normal range of motion. No edema and no tenderness. 2+ distal pulses.    Labs:   Lab Results  Component Value Date   WBC 5.6 12/14/2020   HGB 14.2 12/14/2020   HCT 44.2 12/14/2020   MCV 94.4 12/14/2020   PLT 231 12/14/2020    Lab Results  Component Value Date   TSH 2.91 08/07/2019    Pathology:   Endometrial biopsy: pending r esults  Imaging Studies:  Location: [facility]  Date of Service: 11/06/2019   Indications:Abnormal Uterine Bleeding Findings:  The uterus is anteverted and measures 10.8 x 6.7 x 5.9cm. Echo texture is heterogenous with evidence of focal mass. Within the uterus there is one suspected fibroid measuring: Fibroid 1:  3.8 x 2.7 x 2.6cm (IM, LT)  The Endometrium is thickened and heterogeneous measuring 13.8 mm. IUD is not seen.    Bilateral ovaries are not seen due to body habitus. Survey of the adnexa  demonstrates no adnexal masses. There is no free fluid in the cul de sac.  Impression: 1. Intramural fibroid again seen 3.8cm. 2. Thickened and heterogeneous endometrial canal. IUD is not seen.   Recommendations: 1.Clinical correlation with the patient's History and Physical Exam. 2. Correlate endometrial thickness with menstrual history. If postmenopausal biopsy would be recommended.  3. IUD not seen, abdominal and pelvic x-ray recommended .    Vita Barley, RT   I have reviewed this ultrasound and the report. I agree with the above assessment and plan.  [facility] 11/06/19 11:03 AM     Assessment:    1. Abnormal perimenopausal bleeding   2. Intramural leiomyoma of uterus   3. Morbid obesity with BMI of 50.0-59.9, adult ([place])   4. Type 2 diabetes mellitus with hyperglycemia, with long-term current use of insulin (HCC)   5. Obstructive sleep apnea syndrome      Plan:   Counseling: Procedure, risks, reasons, benefits and complications (including injury to bowel, bladder, major blood vessel, ureter, bleeding, possibility of transfusion, infection, or fistula formation) reviewed in detail. Likelihood of success in alleviating the patient's condition was discussed. Routine postoperative instructions will be reviewed with the patient and her family in detail after surgery.  The patient concurred with the proposed plan, giving informed written consent for the surgery.   Preop testing ordered. Instructions reviewed, including NPO after midnight.    Rubie Maid, MD [facility]

## 2021-01-01 NOTE — H&P (View-Only) (Signed)
GYNECOLOGY PREOPERATIVE HISTORY AND PHYSICAL  Subjective:  Brenda Hicks is a 55 y.o. [id number] here for surgical management of abnormal perimenopausal bleeding and uterine fibroid.Brenda Hicks reports that she has had problems with her cycles since she was a teen. Cycles are often irregular and heavy. Has used Depo Provera, IUD (1st one worked, second one with second one expelled). She notes that after the birth of her last child and tubal ligation in 1991, that her cycles have progressively worsened. Sometimes uses pads, tampons, and diapers simultaneously to control her bleeding. She even notes sometimes having severe cramping without a cycle. Significant preoperative concerns include morbid obesity, h/o sleep apnea, and type II DM.  Proposed surgery: Hysteroscopy D&C, myomectomy, and endometrial ablation.     Pertinent Gynecological History: Menses: irregular occuring and heavy Bleeding: dysfunctional uterine bleeding Contraception: tubal ligation Last pap: normal Date: 12/12/2019.    Past Medical History:  Diagnosis Date  . Anemia    vitamin d deficiency  . Arthritis   . Asthma    WELL CONTROLLED  . 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 Diabetes mellitus without complication ([place])   . Fatty liver   . Hypertension   . Kidney cysts    per patient, never had  . Renal disorder   . Sleep apnea    USES CPAP. waiting for new machine and a new sleep study  . Stroke [facility]) May or June 2019   TIA. no residual symptoms   Past Surgical History:  Procedure Laterality Date  . ANTERIOR CERVICAL DECOMP/DISCECTOMY FUSION N/A 08/09/2018   Procedure: ANTERIOR CERVICAL DECOMPRESSION/DISCECTOMY FUSION 1 LEVEL- C4-5;  Surgeon: Meade Maw, MD;  Location: [facility] ORS;  Service: Neurosurgery;  Laterality: N/A;  . BACK SURGERY    . DILATION AND CURETTAGE OF UTERUS    . ENDOMETRIAL BIOPSY     benign  . EXPLORATORY LAPAROTOMY  1992   REMOVAL OF RUPTURED ECTOPIC  . HAND SURGERY Right  1998   cyst removed  . HERNIA REPAIR  [id number]   UMBILICAL  . JOINT REPLACEMENT Right 2014   TKR  . KNEE ARTHROSCOPY Right 2012  . KNEE SURGERY Right 2014   total knee replacement  . SHOULDER ARTHROSCOPY WITH BICEPSTENOTOMY Left 12/14/2016   Procedure: SHOULDER ARTHROSCOPY WITH BICEPSTENOTOMY;  Surgeon: Corky Mull, MD;  Location: [facility] OR[facility];  Service: Orthopedics;  Laterality: Left;  . SHOULDER ARTHROSCOPY WITH OPEN ROTATOR CUFF REPAIR Left 12/14/2016   Procedure: SHOULDER ARTHROSCOPY WITH OPEN ROTATOR CUFF REPAIR AND ARTHROSCOPIC ROTATOR CUFF REPAIR;  Surgeon: Corky Mull, MD;  Location: [facility];  Service: Orthopedics;  Laterality: Left;  . SHOULDER ARTHROSCOPY WITH ROTATOR CUFF REPAIR Right 01/04/2019   Procedure: SHOULDER ARTHROSCOPY WITH ROTATOR CUFF REPAIR;  Surgeon: Corky Mull, MD;  Location: [facility];  Service: Orthopedics;  Laterality: Right;  . SHOULDER ARTHROSCOPY WITH SUBACROMIAL DECOMPRESSION Left 12/14/2016   Procedure: SHOULDER ARTHROSCOPY WITH SUBACROMIAL DECOMPRESSION;  Surgeon: Corky Mull, MD;  Location: [facility];  Service: Orthopedics;  Laterality: Left;  . SHOULDER ARTHROSCOPY WITH SUBACROMIAL DECOMPRESSION AND BICEP TENDON REPAIR Right 01/04/2019   Procedure: SHOULDER ARTHROSCOPY WITH DEBRIDEMENT AND SUBACROMIAL DECOMPRESSION-RIGHT;  Surgeon: Corky Mull, MD;  Location: [facility];  Service: Orthopedics;  Laterality: Right;  . TUBAL LIGATION     OB History  Gravida Para Term Preterm AB Living  7 6 5 1 1 6   SAB IAB Ectopic Multiple Live Births  1   6    # Outcome Date GA Lbr Len/2nd Weight Sex Delivery Anes PTL Lv  7 Ectopic 1991          6 Term 11/11/89   8 lb 4 oz (3.742 kg) M Vag-Spont  N LIV  5 Term 03/10/88   7 lb 2 oz (3.232 kg) M Vag-Spont  N LIV  4 Term 02/15/87   6 lb (2.722 kg) M Vag-Spont  N LIV  3 Term 01/02/86   5 lb 10 oz (2.551 kg)  Vag-Spont  N LIV     Complications: Dysfunctional Labor  2 Preterm 12/31/84   4 lb 11 oz (2.126 kg) M Vag-Spont  Y  LIV  1 Term 03/05/84   6 lb 7 oz (2.92 kg) M Vag-Spont  N LIV    Family History  Problem Relation Age of Onset  . Hypertension Mother   . Thyroid disease Mother   . Lupus Mother   . Congestive Heart Failure Mother   . CAD Maternal Grandmother   . Breast cancer Neg Hx   . Ovarian cancer Neg Hx   . Colon cancer Neg Hx    Social History   Socioeconomic History  . Marital status: Married    Spouse name: Brenda Hicks  . Number of children: 7  . Years of education: Not on file  . Highest education level: Not on file  Occupational History  . Occupation: disabled    Comment: disabled  Tobacco Use  . Smoking status: Former Smoker    Packs/day: 2.00    Years: 25.00    Pack years: 50.00    Types: Cigarettes    Quit date: 06/09/2017    Years since quitting: 3.5  . Smokeless tobacco: Never Used  Vaping Use  . Vaping Use: Never used  Substance and Sexual Activity  . Alcohol use: No  . Drug use: Yes    Types: Marijuana    Comment: Pt. uses marjiuana only when she has severe back and side pain  . Sexual activity: Not Currently    Partners: Male  Other Topics Concern  . Not on file  Social History Narrative   Have 6 biological childrean and custody of niece since 75 months.   Social Determinants of Health   Financial Resource Strain: Not on file  Food Insecurity: Not on file  Transportation Needs: Not on file  Physical Activity: Not on file  Stress: Not on file  Social Connections: Not on file  Intimate Partner Violence: Not on file   Current Outpatient Medications on File Prior to Visit  Medication Sig Dispense Refill  . albuterol (VENTOLIN HFA) 108 (90 Base) MCG/ACT inhaler Inhale 2 puffs into the lungs every 6 (six) hours as needed for wheezing or shortness of breath. 18 g 3  . atorvastatin (LIPITOR) 20 MG tablet Take 1 tablet (20 mg total) by mouth daily. 90 tablet 3  . cyclobenzaprine (FLEXERIL) 10 MG tablet Take 1 tablet (10 mg total) by mouth 3 (three) times daily as  needed. 30 tablet 1  . Exenatide ER (BYDUREON) 2 MG PEN Inject into the skin.    Marland Kitchen gabapentin (NEURONTIN) 300 MG capsule Take 1 capsule (300 mg total) by mouth 3 (three) times daily. 90 capsule 1  . insulin glargine (LANTUS) 100 UNIT/ML injection Inject 1 mL (100 Units total) into the skin daily. 10 mL 3  . insulin lispro (HUMALOG) 100 UNIT/ML injection Inject 0.3 mLs (30 Units total) into the skin 3 (three)  times daily before meals. 10 mL 1  . Insulin Syringe-Needle U-100 (INSULIN SYRINGE 1CC/31GX5/16") 31G X 5/16" 1 ML MISC     . ketorolac (TORADOL) 10 MG tablet Take 1 tablet (10 mg total) by mouth every 8 (eight) hours. 15 tablet 0  . lisinopril-hydrochlorothiazide (ZESTORETIC) 20-12.5 MG tablet Take 2 tablets by mouth daily. 90 tablet 3  . naproxen sodium (ALEVE) 220 MG tablet Take 220 mg by mouth daily as needed.    Marland Kitchen oxyCODONE-acetaminophen (PERCOCET) 5-325 MG tablet Take 1 tablet by mouth every 4 (four) hours as needed for severe pain. 20 tablet 0  . promethazine (PHENERGAN) 25 MG tablet Take 1 tablet (25 mg total) by mouth every 6 (six) hours as needed for nausea or vomiting. 20 tablet 0  . QUEtiapine (SEROQUEL) 50 MG tablet Take 50 mg by mouth at bedtime.    . traZODone (DESYREL) 50 MG tablet Take 1 tablet (50 mg total) by mouth at bedtime as needed for sleep. 30 tablet 1  . venlafaxine (EFFEXOR) 37.5 MG tablet Take by mouth.    . Vitamin D, Ergocalciferol, (DRISDOL) 1.25 MG (50000 UT) CAPS capsule Take 50,000 Units by mouth every Monday.     No current facility-administered medications on file prior to visit.   Allergies  Allergen Reactions  . Morphine And Related Nausea And Vomiting  . Tramadol     Nausea. If she takes antinausea medicine with this medicine, then she can tolerate it.      Review of Systems Constitutional: No recent fever/chills/sweats Respiratory: No recent cough/bronchitis Cardiovascular: No chest pain Gastrointestinal: No recent  nausea/vomiting/diarrhea Genitourinary: No UTI symptoms Hematologic/lymphatic:No history of coagulopathy or recent blood thinner use    Objective:   Blood pressure 121/86, pulse 91, height 5\' 6"  (1.676 m), weight (!) 318 lb 12.8 oz (144.6 kg), last menstrual period 11/23/2020. CONSTITUTIONAL: Well-developed, well-nourished female in no acute distress.  HENT:  Normocephalic, atraumatic, External right and left ear normal. Oropharynx is clear and moist EYES: Conjunctivae and EOM are normal. Pupils are equal, round, and reactive to light. No scleral icterus.  NECK: Normal range of motion, supple, no masses SKIN: Skin is warm and dry. No rash noted. Not diaphoretic. No erythema. No pallor. NEUROLOGIC: Alert and oriented to person, place, and time. Normal reflexes, muscle tone coordination. No cranial nerve deficit noted. PSYCHIATRIC: Normal mood and affect. Normal behavior. Normal judgment and thought content. CARDIOVASCULAR: Normal heart rate noted, regular rhythm RESPIRATORY: Effort and breath sounds normal, no problems with respiration noted ABDOMEN: Soft, nontender, nondistended. PELVIC: Deferred MUSCULOSKELETAL: Normal range of motion. No edema and no tenderness. 2+ distal pulses.    Labs:   Lab Results  Component Value Date   WBC 5.6 12/14/2020   HGB 14.2 12/14/2020   HCT 44.2 12/14/2020   MCV 94.4 12/14/2020   PLT 231 12/14/2020    Lab Results  Component Value Date   TSH 2.91 08/07/2019    Pathology:   Endometrial biopsy: pending r esults  Imaging Studies:  Location: [facility]  Date of Service: 11/06/2019   Indications:Abnormal Uterine Bleeding Findings:  The uterus is anteverted and measures 10.8 x 6.7 x 5.9cm. Echo texture is heterogenous with evidence of focal mass. Within the uterus there is one suspected fibroid measuring: Fibroid 1:  3.8 x 2.7 x 2.6cm (IM, LT)  The Endometrium is thickened and heterogeneous measuring 13.8 mm. IUD is not seen.    Bilateral ovaries are not seen due to body habitus. Survey of the adnexa  demonstrates no adnexal masses. There is no free fluid in the cul de sac.  Impression: 1. Intramural fibroid again seen 3.8cm. 2. Thickened and heterogeneous endometrial canal. IUD is not seen.   Recommendations: 1.Clinical correlation with the patient's History and Physical Exam. 2. Correlate endometrial thickness with menstrual history. If postmenopausal biopsy would be recommended.  3. IUD not seen, abdominal and pelvic x-ray recommended .    Vita Barley, RT   I have reviewed this ultrasound and the report. I agree with the above assessment and plan.  [facility] 11/06/19 11:03 AM     Assessment:    1. Abnormal perimenopausal bleeding   2. Intramural leiomyoma of uterus   3. Morbid obesity with BMI of 50.0-59.9, adult ([place])   4. Type 2 diabetes mellitus with hyperglycemia, with long-term current use of insulin (HCC)   5. Obstructive sleep apnea syndrome      Plan:   Counseling: Procedure, risks, reasons, benefits and complications (including injury to bowel, bladder, major blood vessel, ureter, bleeding, possibility of transfusion, infection, or fistula formation) reviewed in detail. Likelihood of success in alleviating the patient's condition was discussed. Routine postoperative instructions will be reviewed with the patient and her family in detail after surgery.  The patient concurred with the proposed plan, giving informed written consent for the surgery.   Preop testing ordered. Instructions reviewed, including NPO after midnight.    Rubie Maid, MD [facility]

## 2021-01-02 ENCOUNTER — Telehealth: Payer: Self-pay

## 2021-01-02 MED ORDER — MEDROXYPROGESTERONE ACETATE 10 MG PO TABS: 10.0000 mg | ORAL_TABLET | Freq: Every day | ORAL | 0 refills | Status: DC

## 2021-01-02 NOTE — Telephone Encounter (Signed)
Pt called in and stated that she is still have irregular heavy bleeding with clots, the pt said that it is dark red. The pt was trying to see Dr. Marcelline Mates but she has no open appointments. I told the pt I will send a message to the nurse and that someone will be in touch with her. Please advise

## 2021-01-02 NOTE — Telephone Encounter (Signed)
Would you like for [place] to schedule this patient or call her something in to the pharmacy. Please advise. Brenda Hicks

## 2021-01-02 NOTE — Telephone Encounter (Signed)
I have sent in a prescription for something to help with her bleeding.    Rubie Maid, MD [facility]

## 2021-01-04 ENCOUNTER — Other Ambulatory Visit: Payer: Self-pay | Admitting: Obstetrics and Gynecology

## 2021-01-05 NOTE — Telephone Encounter (Signed)
Please advise. Thanks Darrel Gloss 

## 2021-01-13 ENCOUNTER — Other Ambulatory Visit: Payer: Medicaid Other

## 2021-01-14 ENCOUNTER — Encounter: Payer: Self-pay | Admitting: Family Medicine

## 2021-01-14 ENCOUNTER — Other Ambulatory Visit: Payer: Self-pay

## 2021-01-14 ENCOUNTER — Ambulatory Visit (INDEPENDENT_AMBULATORY_CARE_PROVIDER_SITE_OTHER): Payer: Medicaid Other | Admitting: Family Medicine

## 2021-01-14 ENCOUNTER — Ambulatory Visit (INDEPENDENT_AMBULATORY_CARE_PROVIDER_SITE_OTHER): Payer: Medicaid Other

## 2021-01-14 VITALS — BP 134/80 | HR 105 | Temp 98.8°F | Resp 16 | Ht 66.0 in | Wt 324.3 lb

## 2021-01-14 DIAGNOSIS — Z6841 Body Mass Index (BMI) 40.0 and over, adult: Secondary | ICD-10-CM

## 2021-01-14 DIAGNOSIS — D251 Intramural leiomyoma of uterus: Secondary | ICD-10-CM

## 2021-01-14 DIAGNOSIS — E1165 Type 2 diabetes mellitus with hyperglycemia: Secondary | ICD-10-CM | POA: Diagnosis not present

## 2021-01-14 DIAGNOSIS — E785 Hyperlipidemia, unspecified: Secondary | ICD-10-CM

## 2021-01-14 DIAGNOSIS — I10 Essential (primary) hypertension: Secondary | ICD-10-CM

## 2021-01-14 DIAGNOSIS — E1142 Type 2 diabetes mellitus with diabetic polyneuropathy: Secondary | ICD-10-CM

## 2021-01-14 DIAGNOSIS — E1169 Type 2 diabetes mellitus with other specified complication: Secondary | ICD-10-CM

## 2021-01-14 DIAGNOSIS — G4733 Obstructive sleep apnea (adult) (pediatric): Secondary | ICD-10-CM | POA: Diagnosis not present

## 2021-01-14 DIAGNOSIS — Z1159 Encounter for screening for other viral diseases: Secondary | ICD-10-CM

## 2021-01-14 DIAGNOSIS — N924 Excessive bleeding in the premenopausal period: Secondary | ICD-10-CM

## 2021-01-14 DIAGNOSIS — N939 Abnormal uterine and vaginal bleeding, unspecified: Secondary | ICD-10-CM

## 2021-01-14 DIAGNOSIS — Z794 Long term (current) use of insulin: Secondary | ICD-10-CM

## 2021-01-14 LAB — POCT GLYCOSYLATED HEMOGLOBIN (HGB A1C): Hemoglobin A1C: 9.1 % — AB (ref 4.0–5.6)

## 2021-01-14 MED ORDER — LISINOPRIL-HYDROCHLOROTHIAZIDE 20-12.5 MG PO TABS: 2.0000 | ORAL_TABLET | Freq: Every day | ORAL | 3 refills | Status: DC

## 2021-01-14 MED ORDER — BYDUREON 2 MG ~~LOC~~ PEN: 2.0000 mg | PEN_INJECTOR | SUBCUTANEOUS | 3 refills | Status: DC

## 2021-01-14 MED ORDER — BLOOD GLUCOSE METER KIT: PACK | 0 refills | Status: DC

## 2021-01-14 MED ORDER — ATORVASTATIN CALCIUM 20 MG PO TABS: 20.0000 mg | ORAL_TABLET | Freq: Every day | ORAL | 3 refills | Status: DC

## 2021-01-14 MED ORDER — GABAPENTIN 300 MG PO CAPS: 300.0000 mg | ORAL_CAPSULE | Freq: Three times a day (TID) | ORAL | 1 refills | Status: DC

## 2021-01-14 NOTE — Assessment & Plan Note (Signed)
Continue statin. Rechecking labs today.

## 2021-01-14 NOTE — Assessment & Plan Note (Signed)
Uncontrolled though better from prior, likely 2/2 weight loss. Refilled bydureon and sent rx for new blood glucose meter. Will also refer back to Endo given it's been ~2 years since last seen. F/u in 4 weeks after restarting bydureon. Foot exam wnl today however with hypertrophic nails and difficulties managing, referral generated for podiatry. Much counseling provided today regarding weight loss through diet and exercise, handout provided.

## 2021-01-14 NOTE — Progress Notes (Signed)
SUBJECTIVE:   CHIEF COMPLAINT / HPI:   Patient Active Problem List   Diagnosis Date Noted  . Frequent bowel movements 04/25/2020  . Abnormal Pap smear 03/31/2020  . Arthritis 03/31/2020  . Sprain of collateral ligament of right knee 03/10/2020  . Leg swelling 02/08/2020  . S/P cervical spinal fusion 09/24/2019  . Hyperlipidemia associated with type 2 diabetes mellitus ([place]) 08/08/2019  . Low back pain radiating to left lower extremity 08/07/2019  . Nontraumatic incomplete tear of right rotator cuff 01/04/2019  . Tendinitis of upper biceps tendon of right shoulder 01/04/2019  . Rotator cuff tendinitis, right 12/01/2018  . Cervical myelopathy ([place]) 08/09/2018  . DJD (degenerative joint disease) of cervical spine 08/04/2018  . 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 History of ischemic stroke 03/30/2018  . Ischemic chest pain ([place]) 03/29/2018  . Chest pain 03/29/2018  . Sebaceous cyst 06/01/2016  . Obstructive apnea 05/31/2016  . Acid reflux 05/31/2016  . Essential hypertension 05/31/2016  . Abnormal Pap smear of cervix 05/31/2016  . Arthritis of knee, degenerative 07/25/2015  . History of artificial joint 07/25/2015  . Gonalgia 02/17/2015  . Type 2 diabetes mellitus ([place]) 10/23/2014  . 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 Diabetic polyneuropathy associated with type 2 diabetes mellitus ([place]) 05/16/2014  . Endometrial polyp 11/01/2013  . Fibroids, intramural 10/09/2013  . Pain due to knee joint prosthesis ([place]) 10/05/2013  . Body mass index (BMI) of 50-59.9 in adult ([place]) 10/03/2013  . Abnormal uterine bleeding 10/03/2013  . Morbid obesity ([place]) 10/01/2013  . Excessive and frequent menstruation with irregular cycle 10/01/2013  . Adaptive colitis 10/01/2013  . History of migraine headaches 10/01/2013  . H/O malignant neoplasm of skin 10/01/2013  . Fatty liver disease,  nonalcoholic 37/16/9678  . Diverticulitis 10/01/2013  . Former smoker 08/29/2013  . H/O total knee replacement 08/29/2013  . Insomnia 08/29/2013  . Dysmenorrhea 08/29/2013  . Airway hyperreactivity 08/29/2013  . Absolute anemia 08/29/2013   Hypertension: - Medications: lisinopril-HCTZ 20-12.5mg  2 tabs daily - Compliance: good - Checking BP at home: no - Denies any SOB, CP, vision changes, medication SEs, or symptoms of hypotension - occasional LE edema - not compliant with CPAP, hasn't had in 4 years  Diabetes, Type 2 - previously followed by Endo, last seen 09/2019. - Last A1c 10.0 04/2020 - Medications: lantus 100u daily, humalog 30u TID, bydureon 2mg  weekly - Compliance: taking humalog 40u TID. Been out of bydureon for months. - Checking BG at home: no - Diet: cutting out sweet tea, chips. Cutting back on cookies. Eating more fruits and vegetables.  - Exercise: walking - Eye exam: UTD - Foot exam: due - Statin: yes - Denies symptoms of hypoglycemia, polyuria, polydipsia, numbness extremities, foot ulcers/trauma  HLD - medications: lipitor 20mg  - compliance: good - medication SEs: none  Fibroids, abnormal perimenopausal bleeding - following with GYN. Undergoing D&C 2/28.  OBJECTIVE:   BP 134/80   Pulse (!) 105   Temp 98.8 F (37.1 C) (Oral)   Resp 16   Ht 5\' 6"  (1.676 m)   Wt (!) 324 lb 4.8 oz (147.1 kg)   SpO2 98%   BMI 52.34 kg/m   Gen: well appearing, in NAD. Morbidly obese Card: RRR Lungs: CTAB Ext: WWP, no edema. Foot exam wnl.   ASSESSMENT/PLAN:   Essential hypertension Doing well on current regimen, no changes made today.   Obstructive apnea Has been  without CPAP for years. New referral made for updated sleep study.  Type 2 diabetes mellitus (HCC) Uncontrolled though better from prior, likely 2/2 weight loss. Refilled bydureon and sent rx for new blood glucose meter. Will also refer back to Endo given it's been ~2 years since last seen. F/u in  4 weeks after restarting bydureon. Foot exam wnl today however with hypertrophic nails and difficulties managing, referral generated for podiatry. Much counseling provided today regarding weight loss through diet and exercise, handout provided.   Hyperlipidemia associated with type 2 diabetes mellitus (HCC) Continue statin. Rechecking labs today.   Body mass index (BMI) of 50-59.9 in adult [facility]) Contributing to HTN, DM2. Recommend weight loss through diet and exercise, congratulated efforts. Handout provided.   Abnormal uterine bleeding Following with GYN, planning for D&C next week.      Myles Gip, DO

## 2021-01-14 NOTE — Assessment & Plan Note (Signed)
Following with GYN, planning for D&C next week.

## 2021-01-14 NOTE — Assessment & Plan Note (Signed)
Has been without CPAP for years. New referral made for updated sleep study.

## 2021-01-14 NOTE — Assessment & Plan Note (Signed)
Contributing to HTN, DM2. Recommend weight loss through diet and exercise, congratulated efforts. Handout provided.

## 2021-01-14 NOTE — Patient Instructions (Signed)
It was great to see you!  Our plans for today:  - I sent refills of all your medications to the pharmacy as well as a prescription for a new blood sugar meter.  - I sent referrals for podiatry (foot doctor), sleep study, and Endocrinology. Let us know if you don't hear about appointments in the next few weeks.  - come back in 4 weeks after you have restarted the bydureon to recheck your sugars. Keep a log of your blood sugars the next few weeks and bring this with you at your next appointment.  - Keep working on losing weight. See below for tips.   We are checking some labs today, we will release these results to your MyChart.  Take care and seek immediate care sooner if you develop any concerns.   Dr. Ky Barban  Here is an example of what a healthy plate looks like:    ? Make half your plate fruits and vegetables.     ? Focus on whole fruits.     ? Vary your veggies.  ? Make half your grains whole grains. -     ? Look for the word "whole" at the beginning of the ingredients list    ? Some whole-grain ingredients include whole oats, whole-wheat flour,        whole-grain corn, whole-grain brown rice, and whole rye.  ? Move to low-fat and fat-free milk or yogurt.  ? Vary your protein routine. - Meat, fish, poultry (chicken, [place]), eggs, beans (kidney, pinto), dairy.  ? Drink and eat less sodium, saturated fat, and added sugars.   Diet Recommendations for Diabetes   1. Eat at least 3 meals and 1-2 snacks per day. Never go more than 4-5 hours while awake without eating. Eat breakfast within the first hour of getting up.   2. Limit starchy foods to TWO per meal and ONE per snack. ONE portion of a starchy  food is equal to the following:   - ONE slice of bread (or its equivalent, such as half of a hamburger bun).   - 1/2 cup of a "scoopable" starchy food such as potatoes or rice.   - 15 grams of Total Carbohydrate as shown on food label.  3. Include at every meal: a protein food,  a carb food, and vegetables and/or fruit.   - Obtain twice the volume of vegetables as protein or carbohydrate foods for both lunch and dinner.   - Fresh or frozen vegetables are best.   - Keep frozen vegetables on hand for a quick vegetable serving.       Starchy (carb) foods: Bread, rice, pasta, potatoes, corn, cereal, grits, crackers, bagels, muffins, all baked goods.  (Fruits, milk, and yogurt also have carbohydrate, but most of these foods will not spike your blood sugar as most starchy foods will.)  A few fruits do cause high blood sugars; use small portions of bananas (limit to 1/2 at a time), grapes, watermelon, oranges, and most tropical fruits.    Protein foods: Meat, fish, poultry, eggs, dairy foods, and beans such as pinto and kidney beans (beans also provide carbohydrate).

## 2021-01-14 NOTE — Assessment & Plan Note (Signed)
Doing well on current regimen, no changes made today. 

## 2021-01-15 ENCOUNTER — Encounter
Admission: RE | Admit: 2021-01-15 | Discharge: 2021-01-15 | Disposition: A | Discharge status: Home / Self Care | Payer: Medicaid Other | Source: Ambulatory Visit | Attending: Obstetrics and Gynecology | Admitting: Obstetrics and Gynecology

## 2021-01-15 HISTORY — DX: Chronic obstructive pulmonary disease, unspecified: J44.9

## 2021-01-15 LAB — LIPID PANEL
Cholesterol: 164 mg/dL (ref <200)
HDL: 37 mg/dL — ABNORMAL LOW (ref >=50)
LDL Cholesterol (Calc): 104 mg/dL (calc) — ABNORMAL HIGH
Non-HDL Cholesterol (Calc): 127 mg/dL (calc) (ref <130)
Total CHOL/HDL Ratio: 4.4 (calc) (ref <5.0)
Triglycerides: 126 mg/dL (ref <150)

## 2021-01-15 LAB — HEPATITIS C ANTIBODY
Hepatitis C Ab: NONREACTIVE
SIGNAL TO CUT-OFF: 0.01 (ref <1.00)

## 2021-01-15 MED ORDER — ATORVASTATIN CALCIUM 40 MG PO TABS: 40.0000 mg | ORAL_TABLET | Freq: Every day | ORAL | 3 refills | Status: DC

## 2021-01-15 NOTE — Pre-Procedure Instructions (Signed)
EKG  NSR 73 bpm PR interval 158 ms QRS duration 74 ms Normal axis No STEMI ____________________________________________

## 2021-01-15 NOTE — Addendum Note (Signed)
Addended by: Myles Gip on: 01/15/2021 11:58 AM   Modules accepted: Orders

## 2021-01-15 NOTE — Patient Instructions (Signed)
Your procedure is scheduled on:01-19-21 MONDAY Report to the Registration Desk on the 1st floor of the [facility]-Then proceed to the 2nd floor Surgery Desk in the [facility] To find out your arrival time, please call [phone] between 1PM - 3PM on:01-16-21 FRIDAY  REMEMBER: Instructions that are not followed completely may result in serious medical risk, up to and including death; or upon the discretion of your surgeon and anesthesiologist your surgery may need to be rescheduled.  Do not eat food after midnight the night before surgery.  No gum chewing, lozengers or hard candies.  You may however, drink WATER up to 2 hours before you are scheduled to arrive for your surgery. Do not drink anything within 2 hours of your scheduled arrival time.  Type 1 and Type 2 diabetics should only drink water.  TAKE THESE MEDICATIONS THE MORNING OF SURGERY WITH A SIP OF WATER: -NONE  USE YOUR ALBUTEROL INHALER THE DAY OF SURGERY AND BRING INHALER TO [place]  Take 1/2 of usual insulin dose the night before surgery and none on the morning of surgery-TAKE HALF OF YOUR LANTUS SUNDAY NIGHT (50 UNITS) AND NO INSULIN THE MORNING OF YOUR SURGERY  One week prior to surgery: Stop Anti-inflammatories (NSAIDS) such as Advil, Aleve, Ibuprofen, Motrin, Naproxen, Naprosyn and Aspirin based products such as Excedrin, Goodys Powder, BC Powder-OK TO TAKE TYLENOL IF NEEDED  Stop ANY OVER THE COUNTER supplements until after surgery.  No Alcohol for 24 hours before or after surgery.  No Smoking including e-cigarettes for 24 hours prior to surgery.  No chewable tobacco products for at least 6 hours prior to surgery.  No nicotine patches on the day of surgery.  Do not use any "recreational" drugs for at least a week prior to your surgery.  Please be advised that the combination of cocaine and anesthesia may have negative outcomes, up to and including death. If you test positive for cocaine, your surgery  will be cancelled.  On the morning of surgery brush your teeth with toothpaste and water, you may rinse your mouth with mouthwash if you wish. Do not swallow any toothpaste or mouthwash.  Do not wear jewelry, make-up, hairpins, clips or nail polish.  Do not wear lotions, powders, or perfumes.   Do not shave body from the neck down 48 hours prior to surgery just in case you cut yourself which could leave a site for infection.  Also, freshly shaved skin may become irritated if using the CHG soap.  Contact lenses, hearing aids and dentures may not be worn into surgery.  Do not bring valuables to the hospital. [facility] is not responsible for any missing/lost belongings or valuables.   Notify your doctor if there is any change in your medical condition (cold, fever, infection).  Wear comfortable clothing (specific to your surgery type) to the hospital.  Plan for stool softeners for home use; pain medications have a tendency to cause constipation. You can also help prevent constipation by eating foods high in fiber such as fruits and vegetables and drinking plenty of fluids as your diet allows.  After surgery, you can help prevent lung complications by doing breathing exercises.  Take deep breaths and cough every 1-2 hours. Your doctor may order a device called an Incentive Spirometer to help you take deep breaths. When coughing or sneezing, hold a pillow firmly against your incision with both hands. This is called "splinting." Doing this helps protect your incision. It also decreases belly discomfort.  If you are being admitted to the hospital overnight, leave your suitcase in the car. After surgery it may be brought to your room.  If you are being discharged the day of surgery, you will not be allowed to drive home. You will need a responsible adult (18 years or older) to drive you home and stay with you that night.   If you are taking public transportation, you will need to have a  responsible adult (18 years or older) with you. Please confirm with your physician that it is acceptable to use public transportation.   Please call the [place] Dept. at [phone] if you have any questions about these instructions.  Visitation Policy:  Patients undergoing a surgery or procedure may have one family member or support person with them as long as that person is not COVID-19 positive or experiencing its symptoms.  That person may remain in the waiting area during the procedure.  Inpatient Visitation:    Visiting hours are 7 a.m. to 8 p.m. Patients will be allowed one visitor. The visitor may change daily. The visitor must pass COVID-19 screenings, use hand sanitizer when entering and exiting the patient's room and wear a mask at all times, including in the patient's room. Patients must also wear a mask when staff or their visitor are in the room. Masking is required regardless of vaccination status. Systemwide, no visitors 17 or younger.  Visitation Policy Changes: The following changes are to take effect on Feb. 28 at 7 a.m.  No visitors under the age of 11. Any visitor under the age of 78 must be accompanied by an adult. Adult inpatients: Two visitors will be allowed daily and the visitors may change each day during the patient's stay. (Please note -- no changes at this time for the [facility] [place], Children's Emergency Department and inpatients, Emergency Departments, Ambulatory Sites, [facility], medical practices and procedural areas.)

## 2021-01-16 ENCOUNTER — Other Ambulatory Visit: Payer: Self-pay

## 2021-01-16 ENCOUNTER — Encounter
Admission: RE | Admit: 2021-01-16 | Discharge: 2021-01-16 | Disposition: A | Discharge status: Home / Self Care | Payer: Medicaid Other | Source: Ambulatory Visit | Attending: Obstetrics and Gynecology | Admitting: Obstetrics and Gynecology

## 2021-01-16 ENCOUNTER — Other Ambulatory Visit: Payer: Medicaid Other

## 2021-01-16 DIAGNOSIS — Z20822 Contact with and (suspected) exposure to covid-19: Secondary | ICD-10-CM | POA: Insufficient documentation

## 2021-01-16 DIAGNOSIS — E119 Type 2 diabetes mellitus without complications: Secondary | ICD-10-CM | POA: Diagnosis not present

## 2021-01-16 DIAGNOSIS — Z01818 Encounter for other preprocedural examination: Secondary | ICD-10-CM | POA: Diagnosis not present

## 2021-01-16 DIAGNOSIS — I10 Essential (primary) hypertension: Secondary | ICD-10-CM | POA: Diagnosis not present

## 2021-01-16 LAB — BASIC METABOLIC PANEL
Anion gap: 8 (ref 5–15)
BUN: 11 mg/dL (ref 6–20)
CO2: 28 mmol/L (ref 22–32)
Calcium: 8.6 mg/dL — ABNORMAL LOW (ref 8.9–10.3)
Chloride: 101 mmol/L (ref 98–111)
Creatinine, Ser: 0.84 mg/dL (ref 0.44–1.00)
GFR, Estimated: >60 mL/min (ref >60)
Glucose, Bld: 198 mg/dL — ABNORMAL HIGH (ref 70–99)
Potassium: 3.7 mmol/L (ref 3.5–5.1)
Sodium: 137 mmol/L (ref 135–145)

## 2021-01-16 LAB — CBC
HCT: 37.3 % (ref 36.0–46.0)
Hemoglobin: 11.7 g/dL — ABNORMAL LOW (ref 12.0–15.0)
MCH: 29.8 pg (ref 26.0–34.0)
MCHC: 31.4 g/dL (ref 30.0–36.0)
MCV: 94.9 fL (ref 80.0–100.0)
Platelets: 244 10*3/uL (ref 150–400)
RBC: 3.93 MIL/uL (ref 3.87–5.11)
RDW: 13.8 % (ref 11.5–15.5)
WBC: 5.4 10*3/uL (ref 4.0–10.5)
nRBC: 0 % (ref 0.0–0.2)

## 2021-01-16 LAB — SARS CORONAVIRUS 2 (TAT 6-24 HRS): SARS Coronavirus 2: NEGATIVE

## 2021-01-19 ENCOUNTER — Ambulatory Visit: Payer: Medicaid Other | Admitting: Urgent Care

## 2021-01-19 ENCOUNTER — Encounter
Admission: RE | Disposition: A | Discharge status: Home / Self Care | Payer: Self-pay | Source: Home / Self Care | Attending: Obstetrics and Gynecology

## 2021-01-19 ENCOUNTER — Ambulatory Visit: Payer: Medicaid Other | Admitting: Certified Registered Nurse Anesthetist

## 2021-01-19 ENCOUNTER — Encounter: Payer: Self-pay | Admitting: Obstetrics and Gynecology

## 2021-01-19 ENCOUNTER — Ambulatory Visit
Admission: RE | Admit: 2021-01-19 | Discharge: 2021-01-19 | Disposition: A | Discharge status: Home / Self Care | Payer: Medicaid Other | Attending: Obstetrics and Gynecology | Admitting: Obstetrics and Gynecology

## 2021-01-19 DIAGNOSIS — Z96651 Presence of right artificial knee joint: Secondary | ICD-10-CM | POA: Diagnosis not present

## 2021-01-19 DIAGNOSIS — Z87891 Personal history of nicotine dependence: Secondary | ICD-10-CM | POA: Insufficient documentation

## 2021-01-19 DIAGNOSIS — Z8249 Family history of ischemic heart disease and other diseases of the circulatory system: Secondary | ICD-10-CM | POA: Insufficient documentation

## 2021-01-19 DIAGNOSIS — Z8673 Personal history of transient ischemic attack (TIA), and cerebral infarction without residual deficits: Secondary | ICD-10-CM | POA: Insufficient documentation

## 2021-01-19 DIAGNOSIS — Z885 Allergy status to narcotic agent status: Secondary | ICD-10-CM | POA: Diagnosis not present

## 2021-01-19 DIAGNOSIS — Z85828 Personal history of other malignant neoplasm of skin: Secondary | ICD-10-CM | POA: Insufficient documentation

## 2021-01-19 DIAGNOSIS — K760 Fatty (change of) liver, not elsewhere classified: Secondary | ICD-10-CM | POA: Insufficient documentation

## 2021-01-19 DIAGNOSIS — E1165 Type 2 diabetes mellitus with hyperglycemia: Secondary | ICD-10-CM | POA: Insufficient documentation

## 2021-01-19 DIAGNOSIS — I10 Essential (primary) hypertension: Secondary | ICD-10-CM | POA: Diagnosis not present

## 2021-01-19 DIAGNOSIS — D251 Intramural leiomyoma of uterus: Secondary | ICD-10-CM | POA: Diagnosis not present

## 2021-01-19 DIAGNOSIS — G4733 Obstructive sleep apnea (adult) (pediatric): Secondary | ICD-10-CM | POA: Diagnosis not present

## 2021-01-19 DIAGNOSIS — Z79899 Other long term (current) drug therapy: Secondary | ICD-10-CM | POA: Insufficient documentation

## 2021-01-19 DIAGNOSIS — Z794 Long term (current) use of insulin: Secondary | ICD-10-CM | POA: Diagnosis not present

## 2021-01-19 DIAGNOSIS — N924 Excessive bleeding in the premenopausal period: Secondary | ICD-10-CM | POA: Diagnosis not present

## 2021-01-19 DIAGNOSIS — Z6841 Body Mass Index (BMI) 40.0 and over, adult: Secondary | ICD-10-CM | POA: Insufficient documentation

## 2021-01-19 HISTORY — PX: ENDOMETRIAL ABLATION: SHX621

## 2021-01-19 HISTORY — PX: DILATATION & CURETTAGE/HYSTEROSCOPY WITH MYOSURE: SHX6511

## 2021-01-19 LAB — GLUCOSE, CAPILLARY
Glucose-Capillary: 198 mg/dL — ABNORMAL HIGH (ref 70–99)
Glucose-Capillary: 164 mg/dL — ABNORMAL HIGH (ref 70–99)

## 2021-01-19 LAB — URINE DRUG SCREEN, QUALITATIVE (ARMC ONLY)
Amphetamines, Ur Screen: NOT DETECTED
Barbiturates, Ur Screen: NOT DETECTED
Benzodiazepine, Ur Scrn: NOT DETECTED
Cannabinoid 50 Ng, Ur ~~LOC~~: POSITIVE — AB
Cocaine Metabolite,Ur ~~LOC~~: NOT DETECTED
MDMA (Ecstasy)Ur Screen: NOT DETECTED
Methadone Scn, Ur: NOT DETECTED
Opiate, Ur Screen: NOT DETECTED
Phencyclidine (PCP) Ur S: NOT DETECTED
Tricyclic, Ur Screen: NOT DETECTED

## 2021-01-19 LAB — POCT PREGNANCY, URINE: Preg Test, Ur: NEGATIVE

## 2021-01-19 LAB — PREGNANCY, URINE: Preg Test, Ur: NEGATIVE

## 2021-01-19 SURGERY — DILATATION & CURETTAGE/HYSTEROSCOPY WITH MYOSURE
Anesthesia: General | Site: Vagina

## 2021-01-19 MED ORDER — FENTANYL CITRATE (PF) 100 MCG/2ML IJ SOLN
INTRAMUSCULAR | Status: AC
  Administered 2021-01-19: 25 ug via INTRAVENOUS
  Filled 2021-01-19: qty 2

## 2021-01-19 MED ORDER — DEXMEDETOMIDINE (PRECEDEX) IN NS 20 MCG/5ML (4 MCG/ML) IV SYRINGE
PREFILLED_SYRINGE | INTRAVENOUS | Status: DC | PRN
  Administered 2021-01-19: 12 ug via INTRAVENOUS

## 2021-01-19 MED ORDER — HYDROMORPHONE HCL 1 MG/ML IJ SOLN
0.2500 mg | INTRAMUSCULAR | Status: DC | PRN
  Administered 2021-01-19: 0.25 mg via INTRAVENOUS

## 2021-01-19 MED ORDER — FENTANYL CITRATE (PF) 100 MCG/2ML IJ SOLN
INTRAMUSCULAR | Status: AC
  Filled 2021-01-19: qty 2

## 2021-01-19 MED ORDER — ONDANSETRON HCL 4 MG/2ML IJ SOLN
INTRAMUSCULAR | Status: AC
  Filled 2021-01-19: qty 2

## 2021-01-19 MED ORDER — FENTANYL CITRATE (PF) 100 MCG/2ML IJ SOLN
INTRAMUSCULAR | Status: DC | PRN
  Administered 2021-01-19: 100 ug via INTRAVENOUS

## 2021-01-19 MED ORDER — FENTANYL CITRATE (PF) 100 MCG/2ML IJ SOLN
25.0000 ug | INTRAMUSCULAR | Status: DC | PRN
  Administered 2021-01-19 (×4): 25 ug via INTRAVENOUS

## 2021-01-19 MED ORDER — ACETAMINOPHEN 10 MG/ML IV SOLN
INTRAVENOUS | Status: DC | PRN
  Administered 2021-01-19: 1000 mg via INTRAVENOUS

## 2021-01-19 MED ORDER — LACTATED RINGERS IV SOLN: INTRAVENOUS | Status: DC

## 2021-01-19 MED ORDER — GLYCOPYRROLATE 0.2 MG/ML IJ SOLN
INTRAMUSCULAR | Status: DC | PRN
  Administered 2021-01-19: .2 mg via INTRAVENOUS

## 2021-01-19 MED ORDER — SODIUM CHLORIDE 0.9 % IV SOLN: INTRAVENOUS | Status: DC

## 2021-01-19 MED ORDER — OXYCODONE HCL 5 MG PO TABS
ORAL_TABLET | ORAL | Status: AC
  Administered 2021-01-19: 5 mg via ORAL
  Filled 2021-01-19: qty 1

## 2021-01-19 MED ORDER — PROPOFOL 10 MG/ML IV BOLUS
INTRAVENOUS | Status: DC | PRN
  Administered 2021-01-19: 200 mg via INTRAVENOUS

## 2021-01-19 MED ORDER — DEXAMETHASONE SODIUM PHOSPHATE 4 MG/ML IJ SOLN
INTRAMUSCULAR | Status: AC
  Filled 2021-01-19: qty 3

## 2021-01-19 MED ORDER — METOCLOPRAMIDE HCL 5 MG/ML IJ SOLN: 10.0000 mg | Freq: Once | INTRAMUSCULAR | Status: AC | PRN

## 2021-01-19 MED ORDER — MIDAZOLAM HCL 2 MG/2ML IJ SOLN
INTRAMUSCULAR | Status: DC | PRN
  Administered 2021-01-19: 2 mg via INTRAVENOUS

## 2021-01-19 MED ORDER — DEXAMETHASONE SODIUM PHOSPHATE 10 MG/ML IJ SOLN
INTRAMUSCULAR | Status: AC
  Filled 2021-01-19: qty 1

## 2021-01-19 MED ORDER — DEXAMETHASONE SODIUM PHOSPHATE 10 MG/ML IJ SOLN
10.0000 mg | Freq: Once | INTRAMUSCULAR | Status: AC
  Administered 2021-01-19: 10 mg via INTRAVENOUS
  Filled 2021-01-19: qty 1

## 2021-01-19 MED ORDER — OXYCODONE HCL 5 MG PO TABS: 5.0000 mg | ORAL_TABLET | Freq: Once | ORAL | Status: AC

## 2021-01-19 MED ORDER — LIDOCAINE HCL (PF) 2 % IJ SOLN
INTRAMUSCULAR | Status: AC
  Filled 2021-01-19: qty 5

## 2021-01-19 MED ORDER — GLYCOPYRROLATE 0.2 MG/ML IJ SOLN
INTRAMUSCULAR | Status: AC
  Filled 2021-01-19: qty 1

## 2021-01-19 MED ORDER — ONDANSETRON HCL 4 MG/2ML IJ SOLN
4.0000 mg | Freq: Once | INTRAMUSCULAR | Status: AC | PRN
  Administered 2021-01-19: 4 mg via INTRAVENOUS

## 2021-01-19 MED ORDER — ACETAMINOPHEN 10 MG/ML IV SOLN
INTRAVENOUS | Status: AC
  Filled 2021-01-19: qty 100

## 2021-01-19 MED ORDER — SUGAMMADEX SODIUM 500 MG/5ML IV SOLN
INTRAVENOUS | Status: DC | PRN
  Administered 2021-01-19 (×2): 200 mg via INTRAVENOUS

## 2021-01-19 MED ORDER — FAMOTIDINE 20 MG PO TABS
ORAL_TABLET | ORAL | Status: AC
  Filled 2021-01-19: qty 1

## 2021-01-19 MED ORDER — IBUPROFEN 800 MG PO TABS: 800.0000 mg | ORAL_TABLET | Freq: Three times a day (TID) | ORAL | 1 refills | Status: DC | PRN

## 2021-01-19 MED ORDER — POVIDONE-IODINE 10 % EX SWAB: 2.0000 "application " | Freq: Once | CUTANEOUS | Status: DC

## 2021-01-19 MED ORDER — LACTATED RINGERS IV SOLN: INTRAVENOUS | Status: DC | PRN

## 2021-01-19 MED ORDER — CHLORHEXIDINE GLUCONATE 0.12 % MT SOLN
OROMUCOSAL | Status: AC
  Filled 2021-01-19: qty 15

## 2021-01-19 MED ORDER — ROCURONIUM BROMIDE 100 MG/10ML IV SOLN
INTRAVENOUS | Status: DC | PRN
  Administered 2021-01-19: 65 mg via INTRAVENOUS

## 2021-01-19 MED ORDER — LIDOCAINE HCL (CARDIAC) PF 100 MG/5ML IV SOSY
PREFILLED_SYRINGE | INTRAVENOUS | Status: DC | PRN
  Administered 2021-01-19: 100 mg via INTRAVENOUS

## 2021-01-19 MED ORDER — DEXAMETHASONE SODIUM PHOSPHATE 4 MG/ML IJ SOLN
INTRAMUSCULAR | Status: AC
  Filled 2021-01-19: qty 1

## 2021-01-19 MED ORDER — MIDAZOLAM HCL 2 MG/2ML IJ SOLN
INTRAMUSCULAR | Status: AC
  Filled 2021-01-19: qty 2

## 2021-01-19 MED ORDER — METOCLOPRAMIDE HCL 5 MG/ML IJ SOLN
INTRAMUSCULAR | Status: AC
  Administered 2021-01-19: 10 mg via INTRAVENOUS
  Filled 2021-01-19: qty 2

## 2021-01-19 MED ORDER — HYDROMORPHONE HCL 1 MG/ML IJ SOLN
INTRAMUSCULAR | Status: AC
  Administered 2021-01-19: 0.25 mg via INTRAVENOUS
  Filled 2021-01-19: qty 1

## 2021-01-19 MED ORDER — ONDANSETRON HCL 4 MG/2ML IJ SOLN
INTRAMUSCULAR | Status: DC | PRN
  Administered 2021-01-19: 4 mg via INTRAVENOUS

## 2021-01-19 MED ORDER — ROCURONIUM BROMIDE 10 MG/ML (PF) SYRINGE
PREFILLED_SYRINGE | INTRAVENOUS | Status: AC
  Filled 2021-01-19: qty 10

## 2021-01-19 MED ORDER — CHLORHEXIDINE GLUCONATE 0.12 % MT SOLN
15.0000 mL | Freq: Once | OROMUCOSAL | Status: AC
  Administered 2021-01-19: 15 mL via OROMUCOSAL

## 2021-01-19 MED ORDER — FAMOTIDINE 20 MG PO TABS
20.0000 mg | ORAL_TABLET | Freq: Once | ORAL | Status: AC
  Administered 2021-01-19: 20 mg via ORAL

## 2021-01-19 MED ORDER — PROPOFOL 10 MG/ML IV BOLUS
INTRAVENOUS | Status: AC
  Filled 2021-01-19: qty 40

## 2021-01-19 MED ORDER — ORAL CARE MOUTH RINSE: 15.0000 mL | Freq: Once | OROMUCOSAL | Status: AC

## 2021-01-19 MED ORDER — SODIUM CHLORIDE 0.9 % IV SOLN: INTRAVENOUS | Status: DC | PRN

## 2021-01-19 SURGICAL SUPPLY — 21 items
CANISTER SUC SOCK COL 7IN (MISCELLANEOUS) ×2 IMPLANT
CATH ROBINSON RED A/P 16FR (CATHETERS) ×2 IMPLANT
COVER WAND RF STERILE (DRAPES) IMPLANT
DEVICE MYOSURE LITE (MISCELLANEOUS) IMPLANT
ELECT REM PT RETURN 9FT ADLT (ELECTROSURGICAL) ×2
ELECTRODE REM PT RTRN 9FT ADLT (ELECTROSURGICAL) ×1 IMPLANT
GLOVE SURG ENC MOIS LTX SZ6.5 (GLOVE) ×2 IMPLANT
GOWN STRL REUS W/ TWL LRG LVL3 (GOWN DISPOSABLE) ×2 IMPLANT
GOWN STRL REUS W/TWL LRG LVL3 (GOWN DISPOSABLE) ×4
HANDPIECE ABLA MINERVA ENDO (MISCELLANEOUS) ×2 IMPLANT
KIT PROCEDURE FLUENT (KITS) ×2 IMPLANT
KIT TURNOVER CYSTO (KITS) ×2 IMPLANT
MANIFOLD NEPTUNE II (INSTRUMENTS) ×2 IMPLANT
PACK DNC HYST (MISCELLANEOUS) ×2 IMPLANT
PAD OB MATERNITY 4.3X12.25 (PERSONAL CARE ITEMS) ×2 IMPLANT
PAD PREP 24X41 OB/GYN DISP (PERSONAL CARE ITEMS) ×2 IMPLANT
SEAL ROD LENS SCOPE MYOSURE (ABLATOR) ×2 IMPLANT
SOL .9 NS 3000ML IRR  AL (IV SOLUTION) ×1
SOL .9 NS 3000ML IRR AL (IV SOLUTION) ×1
SOL .9 NS 3000ML IRR UROMATIC (IV SOLUTION) ×1 IMPLANT
TUBING CONNECTING 10 (TUBING) IMPLANT

## 2021-01-19 NOTE — Interval H&P Note (Signed)
History and Physical Interval Note:  01/19/2021 9:34 AM  Brenda Hicks  has presented today for surgery, with the diagnosis of perimenopausal bleeding, uterine fibroid.  The various methods of treatment have been discussed with the patient and family. After consideration of risks, benefits and other options for treatment, the patient has consented to  Procedure(s): [place] (N/A) ENDOMETRIAL ABLATION, MINERVA (N/A) as a surgical intervention.  The patient's history has been reviewed, patient examined, no change in status, stable for surgery.  I have reviewed the patient's chart and labs.  Questions were answered to the patient's satisfaction.     Rubie Maid, MD [facility]

## 2021-01-19 NOTE — Anesthesia Postprocedure Evaluation (Signed)
Anesthesia Post Note  Patient: Lorilyn Sheran Spine  Procedure(s) Performed: DILATATION & CURETTAGE/HYSTEROSCOPY (N/A Vagina ) ENDOMETRIAL ABLATION, MINERVA (N/A Vagina )  Patient location during evaluation: PACU Anesthesia Type: General Level of consciousness: awake and alert Pain management: pain level controlled Vital Signs Assessment: post-procedure vital signs reviewed and stable Respiratory status: spontaneous breathing and respiratory function stable Cardiovascular status: stable Anesthetic complications: no   No complications documented.   Last Vitals:  Vitals:   01/19/21 1340 01/19/21 1359  BP: 138/72 (!) 152/67  Pulse:  66  Resp: 16 18  Temp: (!) 36.3 C (!) 36.2 C  SpO2: 95% 94%    Last Pain:  Vitals:   01/19/21 1359  TempSrc: Temporal  PainSc: 0-No pain                 Izaan Kingbird K

## 2021-01-19 NOTE — Discharge Instructions (Signed)
AMBULATORY SURGERY  DISCHARGE INSTRUCTIONS   1) The drugs that you were given will stay in your system until tomorrow so for the next 24 hours you should not:  A) Drive an automobile B) Make any legal decisions C) Drink any alcoholic beverage   2) You may resume regular meals tomorrow.  Today it is better to start with liquids and gradually work up to solid foods.  You may eat anything you prefer, but it is better to start with liquids, then soup and crackers, and gradually work up to solid foods.   3) Please notify your doctor immediately if you have any unusual bleeding, trouble breathing, redness and pain at the surgery site, drainage, fever, or pain not relieved by medication.    4) Additional Instructions:        Please contact your physician with any problems or Same Day Surgery at [phone], Monday through Friday 6 am to 4 pm, or [place] at [facility] number at [phone]. Endometrial Ablation, Care After The following information offers guidance on how to care for yourself after your procedure. Your health care provider may also give you more specific instructions. If you have problems or questions, contact your health care provider. What can I expect after the procedure? After the procedure, it is common to have:  A need to urinate more often than usual for the first 24 hours.  Cramps that feel like menstrual cramps. These may last for 1-2 days.  A thin, watery vaginal discharge that is light pink or brown. This may last for a few weeks. Discharge will be heavy for the first few days after your procedure. You may need to wear a sanitary pad.  Nausea.  Vaginal bleeding for 4-6 weeks after the procedure, as tissue healing occurs. Follow these instructions at home: Medicines  Take over-the-counter and prescription medicines only as told by your health care provider.  If you were prescribed an antibiotic medicine, take it as told by your health care  provider. Do not stop taking the antibiotic even if you start to feel better.  Ask your health care provider if the medicine prescribed to you: ? Requires you to avoid driving or using machinery. ? Can cause constipation. You may need to take these actions to prevent or treat constipation:  Drink enough fluid to keep your urine pale yellow.  Take over-the-counter or prescription medicines.  Eat foods that are high in fiber, such as beans, whole grains, and fresh fruits and vegetables.  Limit foods that are high in fat and processed sugars, such as fried or sweet foods.   Activity  If you were given a sedative during the procedure, it can affect you for several hours. Do not drive or operate machinery until your health care provider says that it is safe.  Do not have sex or put anything into your vagina until your health care provider says that it is safe.  Do not lift anything that is heavier than 5-10 lbs, or the limit that you are told, until your health care provider says that it is safe.  Return to your normal activities as told by your health care provider. Ask your health care provider what activities are safe for you. General instructions  Do not take baths, swim, or use a hot tub until your health care provider says that it is safe. You will be able to take showers.  Check your vaginal area every day for signs of infection. Check for: ? Redness, swelling,  or more pain. ? More blood coming from your vagina. ? A bad-smelling discharge.  Keep all follow-up visits. This is important. Contact a health care provider if:  You have vaginal redness, swelling, or more pain.  You have discharge or bleeding from your vagina that is getting worse.  You have a bad-smelling vaginal discharge.  You have a fever or chills.  You have trouble urinating. Get help right away if:  You have heavy, bright red vaginal bleeding that may include blood clots.  You have severe cramps that  do not get better with medicine. Summary  After endometrial ablation, it is normal to have a thin, watery vaginal discharge that is light pink or brown. This may last a few weeks and may be heavier right after the procedure.  Vaginal bleeding is common after the procedure and should get better with time.  Check your vaginal area every day for signs of infection, such as a bad-smelling discharge.  Keep all follow-up visits. This is important. This information is not intended to replace advice given to you by your health care provider. Make sure you discuss any questions you have with your health care provider. Document Revised: 05/29/2020 Document Reviewed: 05/29/2020 Elsevier Patient Education  2021 [place] INSTRUCTIONS   5) The drugs that you were given will stay in your system until tomorrow so for the next 24 hours you should not:  D) Drive an automobile E) Make any legal decisions F) Drink any alcoholic beverage   6) You may resume regular meals tomorrow.  Today it is better to start with liquids and gradually work up to solid foods.  You may eat anything you prefer, but it is better to start with liquids, then soup and crackers, and gradually work up to solid foods.   7) Please notify your doctor immediately if you have any unusual bleeding, trouble breathing, redness and pain at the surgery site, drainage, fever, or pain not relieved by medication.    8) Additional Instructions:        Please contact your physician with any problems or Same Day Surgery at [phone], Monday through Friday 6 am to 4 pm, or [place] at [facility] number at [phone].

## 2021-01-19 NOTE — Op Note (Addendum)
Procedure(s): DILATATION & CURETTAGE/HYSTEROSCOPY ENDOMETRIAL ABLATION, MINERVA Procedure Note  Brenda Hicks female 55 y.o. 01/19/2021  Indications: The patient is a 56 y.o. [id number] female with abnormal perimenopausal bleeding, fibroid uterus (intramural), morbid obesity (BMMI 52).  PMH included uncontrolled Type II DM, HTN, obstructive sleep apnea.   Pre-operative Diagnosis: Abnormal perimenopausal bleeding, fibroid uterus, morbid obesity   Post-operative Diagnosis: Same  Surgeon: Rubie Maid, MD  Assistants:  None.   Anesthesia: General endotracheal anesthesia  Findings: Uterus sounded to 7 cm.  Cervical length 3 cm.  Weakly proliferative endometrium.  Tubal ostium was visualized on the left, unable to visualize right ostium. No intrauterine masses.   Procedure Details: The patient was seen in the Holding Room. The risks, benefits, complications, treatment options, and expected outcomes were discussed with the patient.  The patient concurred with the proposed plan, giving informed consent.  The site of surgery properly noted/marked. The patient was taken to the Operating Room, identified as [name] and the procedure verified as Procedure(s) (LRB): DILATATION & CURETTAGE/HYSTEROSCOPY (N/A) ENDOMETRIAL ABLATION, MINERVA (N/A). A Time Out was held and the above information confirmed.  The patient was then placed under general anesthesia without difficulty.  She was then prepped and draped in the normal sterile fashion, and placed in the dorsal lithotomy position.  A time out was performed.  An exam under anesthesia was performed with the findings noted above.  Straight catheterization was performed. A sterile speculum was inserted into vagina. A single-tooth tenaculum was used to grasp the anterior lip of the cervix. Cervical dilation was performed. A 5 mm hysteroscope was introduced into the uterus under direct visualization. The cavity was allowed to fill, and then the entire  cavity was explored with the findings described above. The hysteroscope was removed, and a sharp curette was then passed into the uterus and endometrial sampling was collected for pathology.   Next the Minerva instrument was primed per instructions. The instrument was then placed into the endometrial canal and activated.  The Minerva instrument was then removed from the uterine cavity. The tenaculum was removed and excellent hemostasis was noted. The speculum was removed from the vagina.   All instrument and sponge counts were correct at the end of the procedure x 2.  The patient tolerated the procedure well.  She was awakened and taken to the PACU in stable condition.    Estimated Blood Loss: 10 ml       Drains: straight catheterization prior to procedure with  100 ml of clear urine         Total IV Fluids: 1000 ml  Specimens: Endometrial curettings         Implants: None         Complications:  None; patient tolerated the procedure well.         Disposition: PACU - hemodynamically stable.         Condition: stable   Rubie Maid, MD [facility]

## 2021-01-19 NOTE — Anesthesia Procedure Notes (Signed)
Procedure Name: Intubation Date/Time: 01/19/2021 10:14 AM Performed by: Gayland Curry, CRNA Pre-anesthesia Checklist: Patient identified, Emergency Drugs available, Suction available and Patient being monitored Patient Re-evaluated:Patient Re-evaluated prior to induction Oxygen Delivery Method: Circle system utilized Preoxygenation: Pre-oxygenation with 100% oxygen Induction Type: IV induction Ventilation: Mask ventilation without difficulty and Oral airway inserted - appropriate to patient size Laryngoscope Size: Mac and 3 Grade View: Grade I Tube type: Oral Tube size: 7.0 mm Number of attempts: 1 Placement Confirmation: ETT inserted through vocal cords under direct vision,  positive ETCO2 and breath sounds checked- equal and bilateral Secured at: 21 cm Tube secured with: Tape Dental Injury: Teeth and Oropharynx as per pre-operative assessment

## 2021-01-19 NOTE — Anesthesia Preprocedure Evaluation (Signed)
Anesthesia Evaluation  Patient identified by MRN, date of birth, ID band Patient awake    Reviewed: Allergy & Precautions, NPO status , Patient's Chart, lab work & pertinent test results  History of Anesthesia Complications Negative for: history of anesthetic complications  Airway Mallampati: II       Dental   Pulmonary asthma , sleep apnea , COPD, former smoker,           Cardiovascular hypertension, Pt. on medications (-) Past MI and (-) CHF (-) dysrhythmias (-) Valvular Problems/Murmurs     Neuro/Psych neg Seizures TIA   GI/Hepatic Neg liver ROS, neg GERD  ,  Endo/Other  diabetes, Type 2, Oral Hypoglycemic Agents, Insulin Dependent  Renal/GU negative Renal ROS     Musculoskeletal   Abdominal   Peds  Hematology   Anesthesia Other Findings   Reproductive/Obstetrics                             Anesthesia Physical Anesthesia Plan  ASA: III  Anesthesia Plan: General   Post-op Pain Management:    Induction: Intravenous  PONV Risk Score and Plan: 3 and Ondansetron, Dexamethasone and Treatment may vary due to age or medical condition  Airway Management Planned: Oral ETT  Additional Equipment:   Intra-op Plan:   Post-operative Plan:   Informed Consent: I have reviewed the patients History and Physical, chart, labs and discussed the procedure including the risks, benefits and alternatives for the proposed anesthesia with the patient or authorized representative who has indicated his/her understanding and acceptance.       Plan Discussed with:   Anesthesia Plan Comments:         Anesthesia Quick Evaluation

## 2021-01-19 NOTE — Transfer of Care (Signed)
Immediate Anesthesia Transfer of Care Note  Patient: Brenda Hicks  Procedure(s) Performed: DILATATION & CURETTAGE/HYSTEROSCOPY (N/A Vagina ) ENDOMETRIAL ABLATION, MINERVA (N/A Vagina )  Patient Location: PACU  Anesthesia Type:General  Level of Consciousness: drowsy and patient cooperative  Airway & Oxygen Therapy: Patient Spontanous Breathing and Patient connected to face mask oxygen  Post-op Assessment: Report given to RN  Post vital signs: Reviewed and stable  Last Vitals:  Vitals Value Taken Time  BP 147/104 01/19/21 1115  Temp 36.2 C 01/19/21 1113  Pulse 81 01/19/21 1118  Resp 0 01/19/21 1118  SpO2 100 % 01/19/21 1118  Vitals shown include unvalidated device data.  Last Pain:  Vitals:   01/19/21 1113  TempSrc:   PainSc: Asleep         Complications: No complications documented.

## 2021-01-20 ENCOUNTER — Other Ambulatory Visit: Payer: Self-pay

## 2021-01-20 DIAGNOSIS — E1165 Type 2 diabetes mellitus with hyperglycemia: Secondary | ICD-10-CM

## 2021-01-20 DIAGNOSIS — Z794 Long term (current) use of insulin: Secondary | ICD-10-CM

## 2021-01-20 LAB — SURGICAL PATHOLOGY

## 2021-01-20 NOTE — Telephone Encounter (Signed)
Copied from [place] [id number]. Topic: General - Other >> Jan 20, 2021 11:20 AM Jodie Echevaria wrote: Reason for CRM: Patient called in to inform Shela Commons that the pharmacy does not have the Rx for Exenatide ER (BYDUREON) 2 MG PEN, albuterol (VENTOLIN HFA) 108 (90 Base) MCG/ACT inhaler, and also an Rx for a glucometer. Any questions please call Ph# [phone]

## 2021-01-21 MED ORDER — BYDUREON 2 MG ~~LOC~~ PEN
2.0000 mg | PEN_INJECTOR | SUBCUTANEOUS | 3 refills | Status: DC
Start: 2021-01-21 — End: 2021-02-02

## 2021-01-21 MED ORDER — ALBUTEROL SULFATE HFA 108 (90 BASE) MCG/ACT IN AERS: 2.0000 | INHALATION_SPRAY | Freq: Four times a day (QID) | RESPIRATORY_TRACT | 3 refills | Status: DC | PRN

## 2021-01-21 MED ORDER — BLOOD GLUCOSE METER KIT: PACK | 3 refills | Status: DC

## 2021-01-28 ENCOUNTER — Ambulatory Visit: Payer: Medicaid Other | Admitting: Certified Nurse Midwife

## 2021-01-29 ENCOUNTER — Ambulatory Visit: Payer: Medicaid Other | Admitting: Obstetrics and Gynecology

## 2021-02-02 ENCOUNTER — Ambulatory Visit: Payer: Medicaid Other | Admitting: Family Medicine

## 2021-02-02 ENCOUNTER — Encounter: Payer: Self-pay | Admitting: Family Medicine

## 2021-02-02 ENCOUNTER — Other Ambulatory Visit: Payer: Self-pay

## 2021-02-02 VITALS — BP 140/86 | HR 79 | Temp 98.7°F | Resp 16 | Ht 66.0 in | Wt 318.6 lb

## 2021-02-02 DIAGNOSIS — Z794 Long term (current) use of insulin: Secondary | ICD-10-CM | POA: Diagnosis not present

## 2021-02-02 DIAGNOSIS — E1165 Type 2 diabetes mellitus with hyperglycemia: Secondary | ICD-10-CM

## 2021-02-02 DIAGNOSIS — Z1211 Encounter for screening for malignant neoplasm of colon: Secondary | ICD-10-CM | POA: Diagnosis not present

## 2021-02-02 MED ORDER — TRULICITY 0.75 MG/0.5ML ~~LOC~~ SOAJ: 0.7500 mg | SUBCUTANEOUS | 3 refills | Status: DC

## 2021-02-02 NOTE — Patient Instructions (Signed)
It was great to see you!  Our plans for today:  - We will switch you to trulicity.  - Continue to work on losing weight through increasing activity and healthy eating. - We will plan to see you back in a few months for repeat A1c.  Take care and seek immediate care sooner if you develop any concerns.   Dr. Ky Barban

## 2021-02-02 NOTE — Progress Notes (Signed)
    SUBJECTIVE:   CHIEF COMPLAINT / HPI:   Diabetes, Type 2 - Last A1c 9.1 01/14/21 - referred to Endo at last visit. - Medications: lantus 100u daily qhs, humalog 20u TID with meals, bydureon 2mg  weekly (restarted at last visit). - Compliance: wasn't able to get bydureon. - Checking BG at home: yes, 170s-200s - Eye exam: UTD - Foot exam: UTD - Statin: yes - Denies symptoms of hypoglycemia, polyuria, polydipsia, numbness extremities, foot ulcers/trauma  Bump on finger - few days duration, painful. Has been using warm compresses, came to a head yesterday. With associated swelling. No fevers or systemic symptoms.    OBJECTIVE:   BP 140/86   Pulse 79   Temp 98.7 F (37.1 C) (Oral)   Resp 16   Ht 5\' 6"  (1.676 m)   Wt (!) 318 lb 9.6 oz (144.5 kg)   SpO2 98%   BMI 51.42 kg/m   Gen: well appearing, in NAD Ext: WWP Skin: small boil noted to medial R first finger. No surrounding erythema or streaking. TTP.   ASSESSMENT/PLAN:   Type 2 diabetes mellitus (HCC) Still uncontrolled but unable to get bydureon due to insurance issues. Will switch to trulicity. F/u in June for repeat a1c, expect to titrate trulicity at that time. Recommended to call Endo to schedule appointment.    Boil - recommend continued warm compresses, antibiotic ointment after drains. Return precautions discussed.  Myles Gip, DO

## 2021-02-02 NOTE — Assessment & Plan Note (Signed)
Still uncontrolled but unable to get bydureon due to insurance issues. Will switch to trulicity. F/u in June for repeat a1c, expect to titrate trulicity at that time. Recommended to call Endo to schedule appointment.

## 2021-02-04 ENCOUNTER — Other Ambulatory Visit: Payer: Self-pay

## 2021-02-04 ENCOUNTER — Ambulatory Visit (INDEPENDENT_AMBULATORY_CARE_PROVIDER_SITE_OTHER): Payer: Medicaid Other | Admitting: Certified Nurse Midwife

## 2021-02-04 ENCOUNTER — Encounter: Payer: Self-pay | Admitting: Certified Nurse Midwife

## 2021-02-04 VITALS — BP 137/68 | HR 64 | Ht 66.0 in | Wt 319.1 lb

## 2021-02-04 DIAGNOSIS — Z01419 Encounter for gynecological examination (general) (routine) without abnormal findings: Secondary | ICD-10-CM

## 2021-02-04 DIAGNOSIS — Z1211 Encounter for screening for malignant neoplasm of colon: Secondary | ICD-10-CM | POA: Diagnosis not present

## 2021-02-04 DIAGNOSIS — R0789 Other chest pain: Secondary | ICD-10-CM | POA: Diagnosis not present

## 2021-02-04 DIAGNOSIS — Z0000 Encounter for general adult medical examination without abnormal findings: Secondary | ICD-10-CM | POA: Diagnosis not present

## 2021-02-04 DIAGNOSIS — Z1231 Encounter for screening mammogram for malignant neoplasm of breast: Secondary | ICD-10-CM | POA: Diagnosis not present

## 2021-02-04 DIAGNOSIS — R002 Palpitations: Secondary | ICD-10-CM

## 2021-02-04 NOTE — Patient Instructions (Signed)
Preventive Care 84-53 Years Old, Female Preventive care refers to lifestyle choices and visits with your health care provider that can promote health and wellness. This includes:  A yearly physical exam. This is also called an annual wellness visit.  Regular dental and eye exams.  Immunizations.  Screening for certain conditions.  Healthy lifestyle choices, such as: ? Eating a healthy diet. ? Getting regular exercise. ? Not using drugs or products that contain nicotine and tobacco. ? Limiting alcohol use. What can I expect for my preventive care visit? Physical exam Your health care provider will check your:  Height and weight. These may be used to calculate your BMI (body mass index). BMI is a measurement that tells if you are at a healthy weight.  Heart rate and blood pressure.  Body temperature.  Skin for abnormal spots. Counseling Your health care provider may ask you questions about your:  Past medical problems.  Family's medical history.  Alcohol, tobacco, and drug use.  Emotional well-being.  Home life and relationship well-being.  Sexual activity.  Diet, exercise, and sleep habits.  Work and work Statistician.  Access to firearms.  Method of birth control.  Menstrual cycle.  Pregnancy history. What immunizations do I need? Vaccines are usually given at various ages, according to a schedule. Your health care provider will recommend vaccines for you based on your age, medical history, and lifestyle or other factors, such as travel or where you work.   What tests do I need? Blood tests  Lipid and cholesterol levels. These may be checked every 5 years, or more often if you are over 53 years old.  Hepatitis C test.  Hepatitis B test. Screening  Lung cancer screening. You may have this screening every year starting at age 53 if you have a 30-pack-year history of smoking and currently smoke or have quit within the past 15 years.  Colorectal cancer  screening. ? All adults should have this screening starting at age 53 and continuing until age 17. ? Your health care provider may recommend screening at age 53 if you are at increased risk. ? You will have tests every 1-10 years, depending on your results and the type of screening test.  Diabetes screening. ? This is done by checking your blood sugar (glucose) after you have not eaten for a while (fasting). ? You may have this done every 1-3 years.  Mammogram. ? This may be done every 1-2 years. ? Talk with your health care provider about when you should start having regular mammograms. This may depend on whether you have a family history of breast cancer.  BRCA-related cancer screening. This may be done if you have a family history of breast, ovarian, tubal, or peritoneal cancers.  Pelvic exam and Pap test. ? This may be done every 3 years starting at age 53. ? Starting at age 53, this may be done every 5 years if you have a Pap test in combination with an HPV test. Other tests  STD (sexually transmitted disease) testing, if you are at risk.  Bone density scan. This is done to screen for osteoporosis. You may have this scan if you are at high risk for osteoporosis. Talk with your health care provider about your test results, treatment options, and if necessary, the need for more tests. Follow these instructions at home: Eating and drinking  Eat a diet that includes fresh fruits and vegetables, whole grains, lean protein, and low-fat dairy products.  Take vitamin and mineral supplements  as recommended by your health care provider.  Do not drink alcohol if: ? Your health care provider tells you not to drink. ? You are pregnant, may be pregnant, or are planning to become pregnant.  If you drink alcohol: ? Limit how much you have to 0-1 drink a day. ? Be aware of how much alcohol is in your drink. In the [place] one drink equals one 12 oz bottle of beer (355 mL), one 5 oz glass of  wine (148 mL), or one 1 oz glass of hard liquor (44 mL).   Lifestyle  Take daily care of your teeth and gums. Brush your teeth every morning and night with fluoride toothpaste. Floss one time each day.  Stay active. Exercise for at least 30 minutes 5 or more days each week.  Do not use any products that contain nicotine or tobacco, such as cigarettes, e-cigarettes, and chewing tobacco. If you need help quitting, ask your health care provider.  Do not use drugs.  If you are sexually active, practice safe sex. Use a condom or other form of protection to prevent STIs (sexually transmitted infections).  If you do not wish to become pregnant, use a form of birth control. If you plan to become pregnant, see your health care provider for a prepregnancy visit.  If told by your health care provider, take low-dose aspirin daily starting at age 53.  Find healthy ways to cope with stress, such as: ? Meditation, yoga, or listening to music. ? Journaling. ? Talking to a trusted person. ? Spending time with friends and family. Safety  Always wear your seat belt while driving or riding in a vehicle.  Do not drive: ? If you have been drinking alcohol. Do not ride with someone who has been drinking. ? When you are tired or distracted. ? While texting.  Wear a helmet and other protective equipment during sports activities.  If you have firearms in your house, make sure you follow all gun safety procedures. What's next?  Visit your health care provider once a year for an annual wellness visit.  Ask your health care provider how often you should have your eyes and teeth checked.  Stay up to date on all vaccines. This information is not intended to replace advice given to you by your health care provider. Make sure you discuss any questions you have with your health care provider. Document Revised: 08/12/2020 Document Reviewed: 07/20/2018 Elsevier Patient Education  2021 Elsevier Inc.  

## 2021-02-04 NOTE — Addendum Note (Signed)
Addended by: Hildred Priest on: 02/04/2021 03:03 PM   Modules accepted: Orders

## 2021-02-04 NOTE — Progress Notes (Addendum)
GYNECOLOGY ANNUAL PREVENTATIVE CARE ENCOUNTER NOTE  History:     Brenda Hicks is a 55 y.o. [phone] female here for a routine annual gynecologic exam.  Current complaints: chest tightness , heart racing and occasional palpitations .   Denies abnormal vaginal bleeding, discharge, pelvic pain, problems with intercourse or other gynecologic concerns.     Social Relationship: Living: Has a daughter 44 ( that ran away been missing since 1 wk) Work: none Exercise: walking 2 days a week x 1 hr  Smoke/Alcohol/drug FOY:DXAJOI   Gynecologic History No LMP recorded (lmp unknown). Contraception: none Last Pap: 01/29/19. Results were: normal with negative HPV Last mammogram: It has been a long time ago.  Obstetric History OB History  Gravida Para Term Preterm AB Living  '7 6 5 1 1 6  ' SAB IAB Ectopic Multiple Live Births      1   6    # Outcome Date GA Lbr Len/2nd Weight Sex Delivery Anes PTL Lv  7 Ectopic 1991          6 Term 11/11/89   8 lb 4 oz (3.742 kg) M Vag-Spont  N LIV  5 Term 03/10/88   7 lb 2 oz (3.232 kg) M Vag-Spont  N LIV  4 Term 02/15/87   6 lb (2.722 kg) M Vag-Spont  N LIV  3 Term 01/02/86   5 lb 10 oz (2.551 kg)  Vag-Spont  N LIV     Complications: Dysfunctional Labor  2 Preterm 12/31/84   4 lb 11 oz (2.126 kg) M Vag-Spont  Y LIV  1 Term 03/05/84   6 lb 7 oz (2.92 kg) M Vag-Spont  N LIV    Past Medical History:  Diagnosis Date  . Anemia    vitamin d deficiency  . Arthritis   . Asthma    WELL CONTROLLED  . 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 COPD (chronic obstructive pulmonary disease) ([place])   . Diabetes mellitus without complication ([place])   . Fatty liver   . Hypertension   . Kidney cysts    per patient, never had  . Renal disorder   . Sleep apnea    DOES NOT USE CPAP. waiting for new machine and a new sleep study  . Stroke [facility]) May or June 2019   TIA. no residual symptoms    Past Surgical History:  Procedure Laterality Date  . ANTERIOR  CERVICAL DECOMP/DISCECTOMY FUSION N/A 08/09/2018   Procedure: ANTERIOR CERVICAL DECOMPRESSION/DISCECTOMY FUSION 1 LEVEL- C4-5;  Surgeon: Meade Maw, MD;  Location: [facility] ORS;  Service: Neurosurgery;  Laterality: N/A;  . BACK SURGERY     NECK  . DILATATION & CURETTAGE/HYSTEROSCOPY WITH MYOSURE N/A 01/19/2021   Procedure: DILATATION & CURETTAGE/HYSTEROSCOPY;  Surgeon: Rubie Maid, MD;  Location: [facility] ORS;  Service: Gynecology;  Laterality: N/A;  . DILATION AND CURETTAGE OF UTERUS    . ENDOMETRIAL ABLATION N/A 01/19/2021   Procedure: ENDOMETRIAL ABLATION, MINERVA;  Surgeon: Rubie Maid, MD;  Location: [facility] ORS;  Service: Gynecology;  Laterality: N/A;  . ENDOMETRIAL BIOPSY     benign  . EXPLORATORY LAPAROTOMY  1992   REMOVAL OF RUPTURED ECTOPIC  . HAND SURGERY Right 1998   cyst removed  . HERNIA REPAIR  [id number]   UMBILICAL  . JOINT REPLACEMENT Right 2014   TKR  . KNEE ARTHROSCOPY Right 2012  . KNEE SURGERY Right 2014   total knee replacement  . SHOULDER ARTHROSCOPY WITH BICEPSTENOTOMY Left  12/14/2016   Procedure: SHOULDER ARTHROSCOPY WITH BICEPSTENOTOMY;  Surgeon: Corky Mull, MD;  Location: [facility] ORS;  Service: Orthopedics;  Laterality: Left;  . SHOULDER ARTHROSCOPY WITH OPEN ROTATOR CUFF REPAIR Left 12/14/2016   Procedure: SHOULDER ARTHROSCOPY WITH OPEN ROTATOR CUFF REPAIR AND ARTHROSCOPIC ROTATOR CUFF REPAIR;  Surgeon: Corky Mull, MD;  Location: [facility] ORS;  Service: Orthopedics;  Laterality: Left;  . SHOULDER ARTHROSCOPY WITH ROTATOR CUFF REPAIR Right 01/04/2019   Procedure: SHOULDER ARTHROSCOPY WITH ROTATOR CUFF REPAIR;  Surgeon: Corky Mull, MD;  Location: [facility] ORS;  Service: Orthopedics;  Laterality: Right;  . SHOULDER ARTHROSCOPY WITH SUBACROMIAL DECOMPRESSION Left 12/14/2016   Procedure: SHOULDER ARTHROSCOPY WITH SUBACROMIAL DECOMPRESSION;  Surgeon: Corky Mull, MD;  Location: [facility] ORS;  Service: Orthopedics;  Laterality: Left;  . SHOULDER ARTHROSCOPY WITH SUBACROMIAL  DECOMPRESSION AND BICEP TENDON REPAIR Right 01/04/2019   Procedure: SHOULDER ARTHROSCOPY WITH DEBRIDEMENT AND SUBACROMIAL DECOMPRESSION-RIGHT;  Surgeon: Corky Mull, MD;  Location: [facility] ORS;  Service: Orthopedics;  Laterality: Right;  . TUBAL LIGATION      Current Outpatient Medications on File Prior to Visit  Medication Sig Dispense Refill  . ACCU-CHEK GUIDE test strip USE TO CHECK BLOOD SUGAR UP TO FOUR TIMES DAILY AS DIRECTED    . albuterol (VENTOLIN HFA) 108 (90 Base) MCG/ACT inhaler Inhale 2 puffs into the lungs every 6 (six) hours as needed for wheezing or shortness of breath. 18 g 3  . atorvastatin (LIPITOR) 40 MG tablet Take 1 tablet (40 mg total) by mouth daily. 90 tablet 3  . blood glucose meter kit and supplies Dispense based on patient and insurance preference. Use up to four times daily as directed. (FOR ICD-10 E10.9, E11.9). 1 each 3  . cyclobenzaprine (FLEXERIL) 10 MG tablet Take 1 tablet (10 mg total) by mouth 3 (three) times daily as needed. 30 tablet 1  . Dulaglutide (TRULICITY) 2.13 YQ/6.5HQ SOPN Inject 0.75 mg into the skin once a week. 2 mL 3  . gabapentin (NEURONTIN) 300 MG capsule Take 1 capsule (300 mg total) by mouth 3 (three) times daily. 90 capsule 1  . ibuprofen (ADVIL) 800 MG tablet Take 1 tablet (800 mg total) by mouth every 8 (eight) hours as needed for mild pain or cramping. 30 tablet 1  . insulin glargine (LANTUS) 100 UNIT/ML injection Inject 1 mL (100 Units total) into the skin daily. (Patient taking differently: Inject 100 Units into the skin at bedtime.) 10 mL 3  . insulin lispro (HUMALOG) 100 UNIT/ML injection Inject 0.3 mLs (30 Units total) into the skin 3 (three) times daily before meals. (Patient taking differently: Inject 20 Units into the skin 3 (three) times daily before meals.) 10 mL 1  . Insulin Syringe-Needle U-100 (INSULIN SYRINGE 1CC/31GX5/16") 31G X 5/16" 1 ML MISC     . lisinopril-hydrochlorothiazide (ZESTORETIC) 20-12.5 MG tablet Take 2 tablets by  mouth at bedtime. 180 tablet 3  . traZODone (DESYREL) 50 MG tablet Take 1 tablet (50 mg total) by mouth at bedtime as needed for sleep. 30 tablet 1   No current facility-administered medications on file prior to visit.    Allergies  Allergen Reactions  . Dilaudid [Hydromorphone Hcl] Nausea And Vomiting  . Morphine And Related Nausea And Vomiting  . Tramadol Nausea Only    If she takes antinausea medicine with this medicine, then she can tolerate it.    Social History:  reports that she quit smoking about 3 years ago. Her smoking use included cigarettes. She has a  50.00 pack-year smoking history. She has never used smokeless tobacco. She reports current drug use. Frequency: 7.00 times per week. Drug: Marijuana. She reports that she does not drink alcohol.  Family History  Problem Relation Age of Onset  . Hypertension Mother   . Thyroid disease Mother   . Lupus Mother   . Congestive Heart Failure Mother   . CAD Maternal Grandmother   . Breast cancer Neg Hx   . Ovarian cancer Neg Hx   . Colon cancer Neg Hx     The following portions of the patient's history were reviewed and updated as appropriate: allergies, current medications, past family history, past medical history, past social history, past surgical history and problem list.  Review of Systems Pertinent items noted in HPI and remainder of comprehensive ROS otherwise negative.  Physical Exam:  BP 137/68   Pulse 64   Ht '5\' 6"'  (1.676 m)   Wt (!) 319 lb 1.6 oz (144.7 kg)   LMP  (LMP Unknown)   BMI 51.50 kg/m  CONSTITUTIONAL: Well-developed, well-nourished , morbid obese female in no acute distress.  HENT:  Normocephalic, atraumatic, External right and left ear normal. Oropharynx is clear and moist EYES: Conjunctivae and EOM are normal. Pupils are equal, round, and reactive to light. No scleral icterus.  NECK: Normal range of motion, supple, no masses.  Normal thyroid.  SKIN: Skin is warm and dry. No rash noted. Not  diaphoretic. No erythema. No pallor. MUSCULOSKELETAL: Normal range of motion. No tenderness.  No cyanosis, clubbing, or edema.  2+ distal pulses. NEUROLOGIC: Alert and oriented to person, place, and time. Normal reflexes, muscle tone coordination.  PSYCHIATRIC: Normal mood and affect. Normal behavior. Normal judgment and thought content. CARDIOVASCULAR: Normal heart rate noted, regular rhythm RESPIRATORY: Clear to auscultation bilaterally. Effort and breath sounds normal, no problems with respiration noted. BREASTS: Symmetric in size. No masses, tenderness, skin changes, nipple drainage, or lymphadenopathy bilaterally.  ABDOMEN: Soft, no distention noted.  No tenderness, rebound or guarding.  PELVIC: Normal appearing external genitalia and urethral meatus; normal appearing vaginal mucosa and cervix.  No abnormal discharge noted.  Pap smear not due  -Difficult to evaluate due to body habitus- uterine size, no other palpable masses, no uterine or adnexal tenderness.  Small amount of serosanguinous discharge pt had uterine ablation done 2/28. She denies any pain or bleeding.    Assessment and Plan:    1. Women's annual routine gynecological examination  Pap: not due Mammogram :ordered Labs: colonoscopy ordered Refills: none Referral: GI for colonoscopy , cardiology  Routine preventative health maintenance measures emphasized. Please refer to After Visit Summary for other counseling recommendations.      Philip Aspen, CNM [facility]

## 2021-02-05 IMAGING — CR DG ABDOMEN 1V
1 series · 3 of 3 positions shown · non-contrast
Comparison: CT abdomen pelvis 07/31/2019

CLINICAL DATA: 3-4 months of lower abdominal pain and loose bowel
movements

EXAM:
ABDOMEN - 1 VIEW

[Series 1: dg abd 1 view · 0.14mm/px · 3 of 3 slices shown]
[im 1/3]
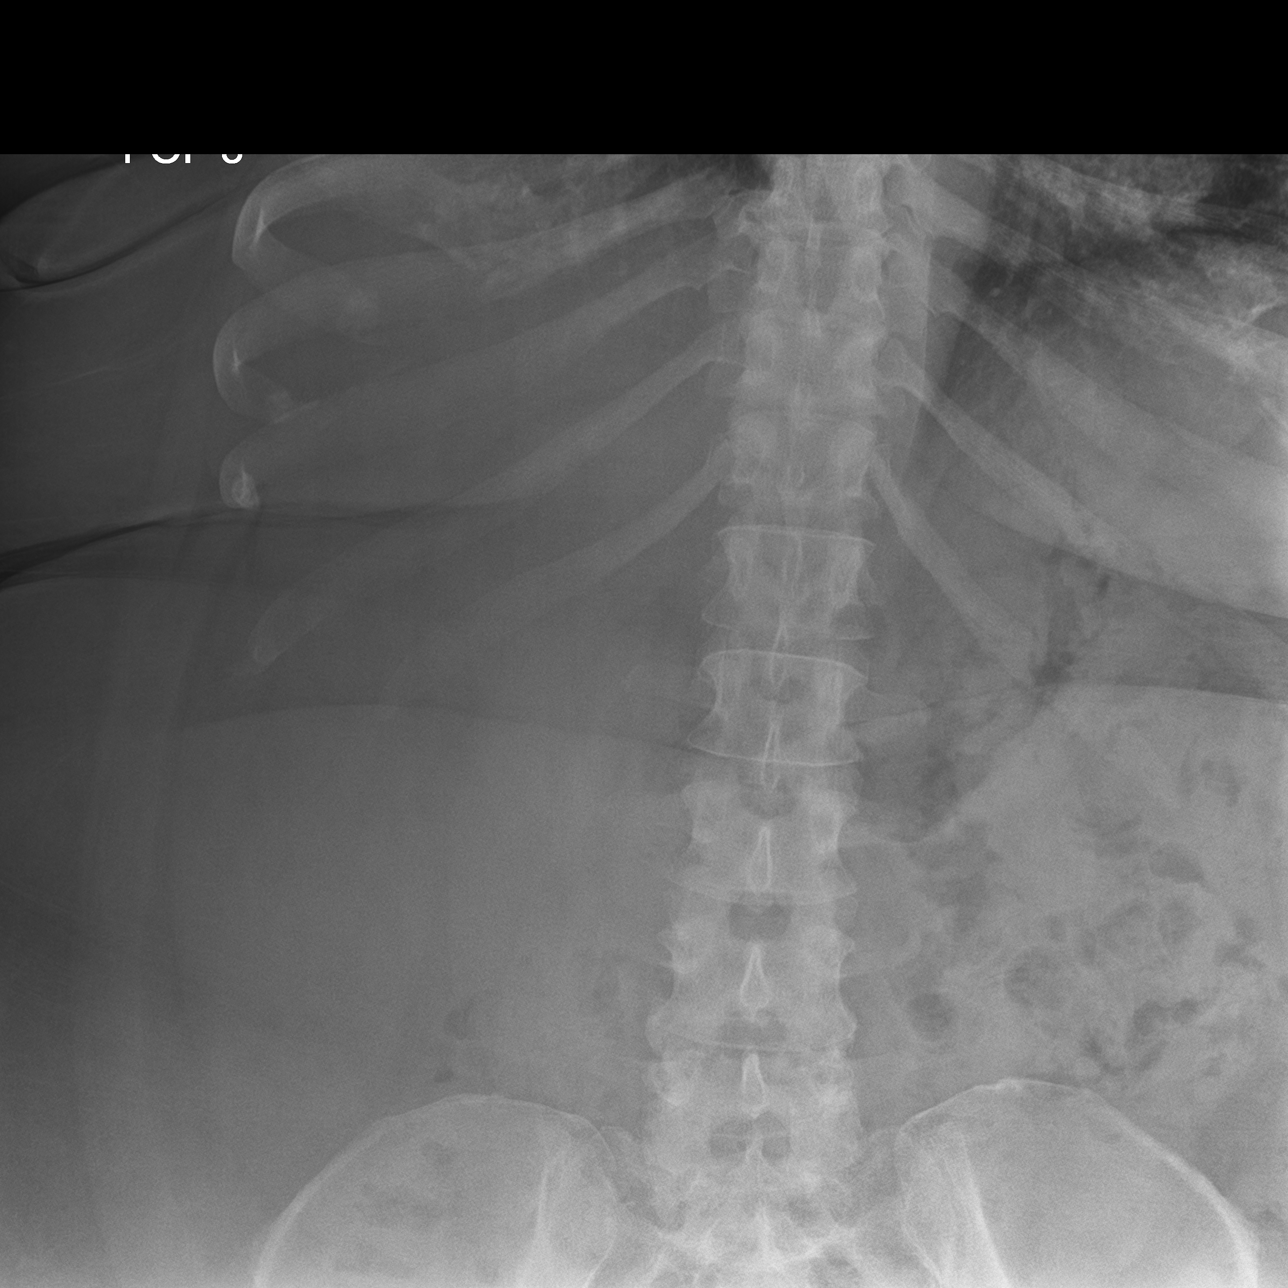
[im 2/3]
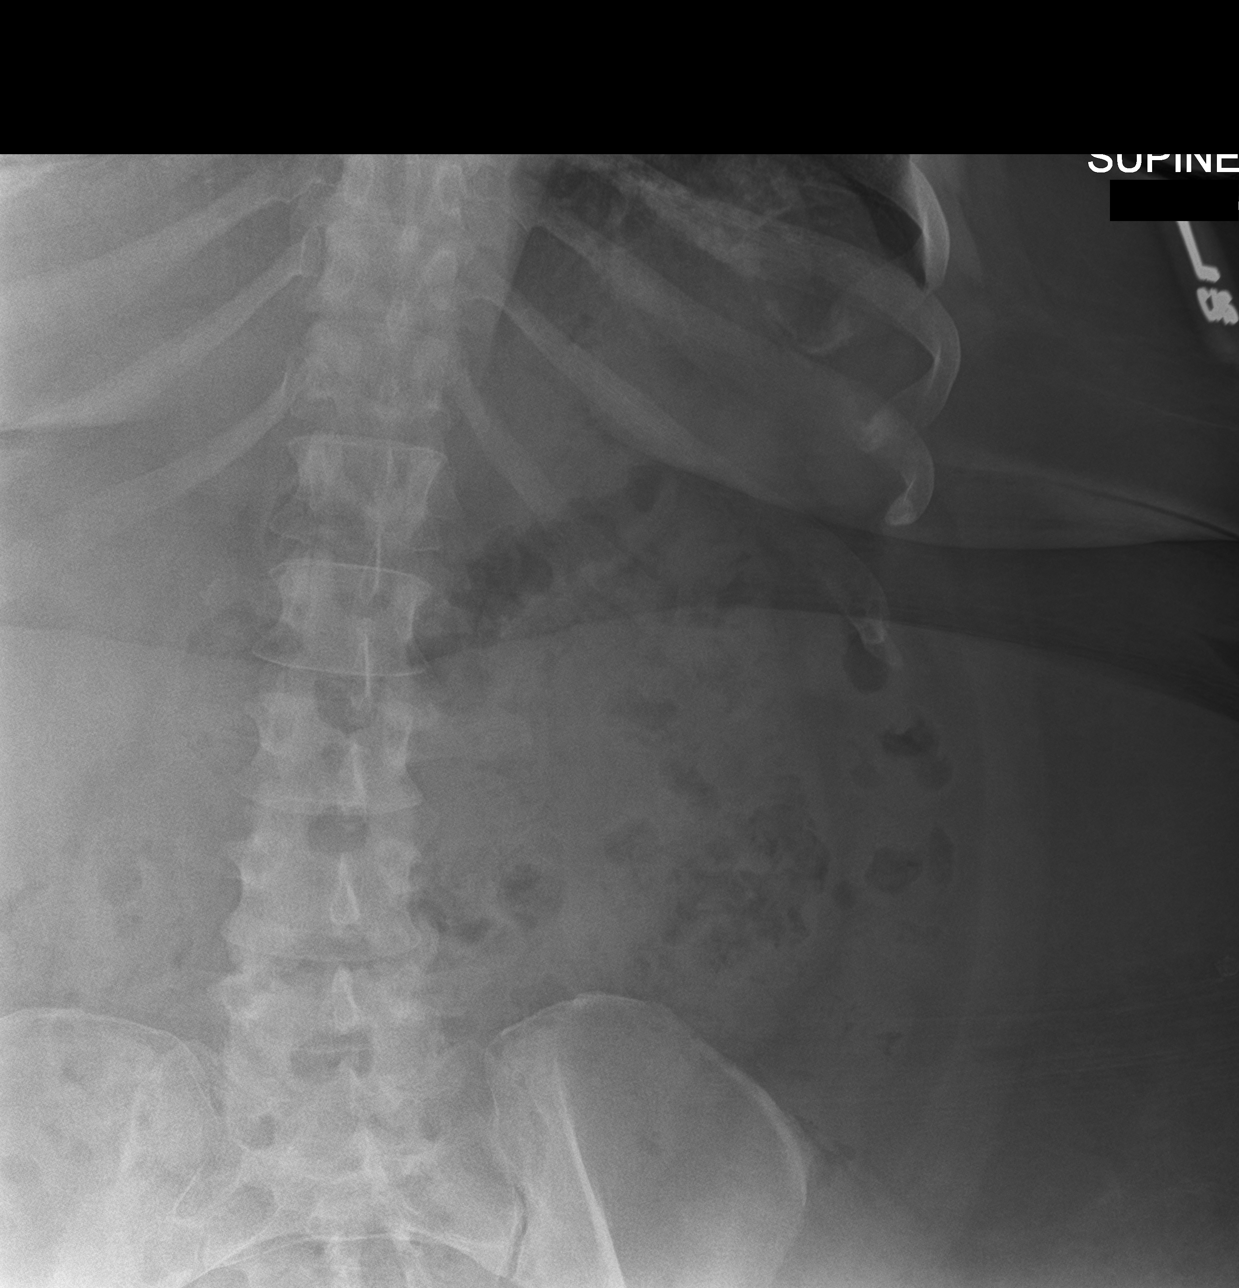
[im 3/3]
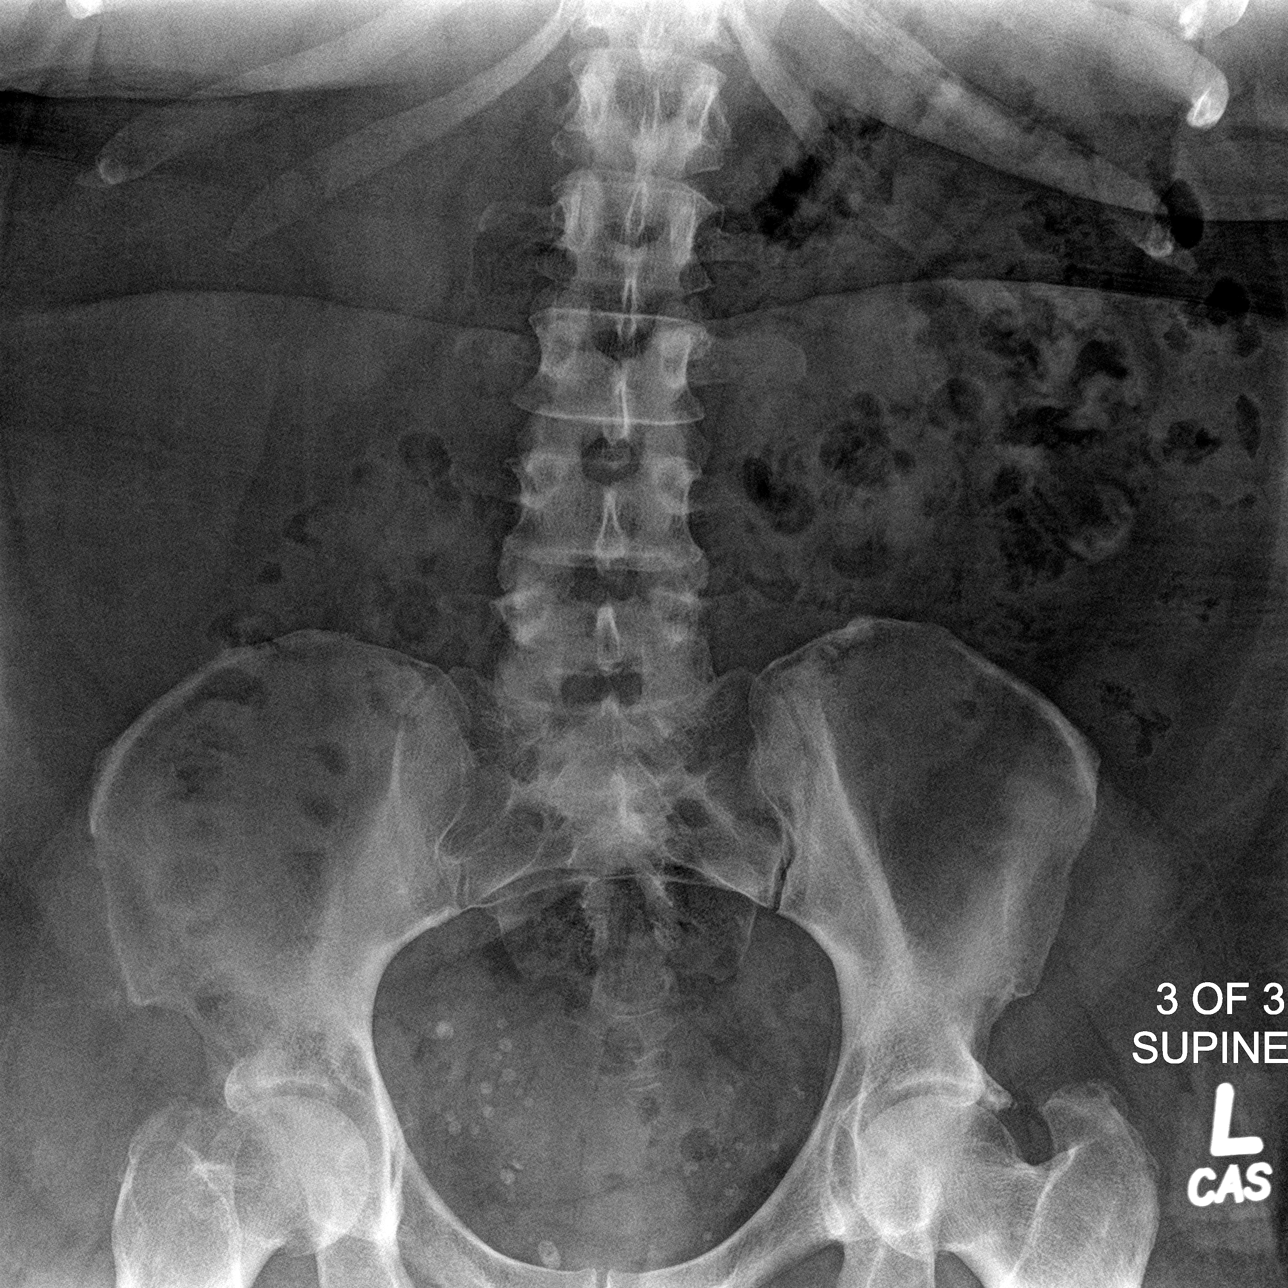

[3 of 3 positions shown; findings below may reference images not displayed]

FINDINGS: No high-grade obstructive bowel gas pattern. Numerous rounded
phleboliths in the pelvis are similar to comparison CT. No
suspicious calcifications over the gallbladder fossa or urinary
tract. Degenerative changes in the spine and pelvis are similar to
comparison is as well.
IMPRESSION: No high-grade obstructive bowel gas pattern or suspicious
calcifications.

## 2021-02-06 ENCOUNTER — Ambulatory Visit: Payer: Medicaid Other | Admitting: Obstetrics and Gynecology

## 2021-02-09 ENCOUNTER — Telehealth: Payer: Self-pay

## 2021-02-09 NOTE — Telephone Encounter (Signed)
Prior Authorization for Trulicity was approved from 02/09/2021-02/04/2022. Patient has been notified.

## 2021-02-11 ENCOUNTER — Other Ambulatory Visit: Payer: Self-pay

## 2021-02-11 ENCOUNTER — Telehealth (INDEPENDENT_AMBULATORY_CARE_PROVIDER_SITE_OTHER): Payer: Self-pay | Admitting: Gastroenterology

## 2021-02-11 DIAGNOSIS — Z1211 Encounter for screening for malignant neoplasm of colon: Secondary | ICD-10-CM

## 2021-02-11 MED ORDER — NA SULFATE-K SULFATE-MG SULF 17.5-3.13-1.6 GM/177ML PO SOLN: 1.0000 | Freq: Once | ORAL | 0 refills | Status: AC

## 2021-02-11 NOTE — Progress Notes (Signed)
Gastroenterology Pre-Procedure Review  Request Date: Tuesday 02/24/21 Requesting Physician: Dr. Bonna Gains  PATIENT REVIEW QUESTIONS: The patient responded to the following health history questions as indicated:    1. Are you having any GI issues? no 2. Do you have a personal history of Polyps? no 3. Do you have a family history of Colon Cancer or Polyps? no 4. Diabetes Mellitus? yes (type 2) 5. Joint replacements in the past 12 months?no 6. Major health problems in the past 3 months? D and C on 01/19/21 at [facility] 7. Any artificial heart valves, MVP, or defibrillator?no    MEDICATIONS & ALLERGIES:    Patient reports the following regarding taking any anticoagulation/antiplatelet therapy:   Plavix, Coumadin, Eliquis, Xarelto, Lovenox, Pradaxa, Brilinta, or Effient? no Aspirin? no  Patient confirms/reports the following medications:  Current Outpatient Medications  Medication Sig Dispense Refill  . ACCU-CHEK GUIDE test strip USE TO CHECK BLOOD SUGAR UP TO FOUR TIMES DAILY AS DIRECTED    . albuterol (VENTOLIN HFA) 108 (90 Base) MCG/ACT inhaler Inhale 2 puffs into the lungs every 6 (six) hours as needed for wheezing or shortness of breath. 18 g 3  . atorvastatin (LIPITOR) 40 MG tablet Take 1 tablet (40 mg total) by mouth daily. 90 tablet 3  . blood glucose meter kit and supplies Dispense based on patient and insurance preference. Use up to four times daily as directed. (FOR ICD-10 E10.9, E11.9). 1 each 3  . cyclobenzaprine (FLEXERIL) 10 MG tablet Take 1 tablet (10 mg total) by mouth 3 (three) times daily as needed. 30 tablet 1  . Dulaglutide (TRULICITY) 8.18 HT/0.9PJ SOPN Inject 0.75 mg into the skin once a week. 2 mL 3  . gabapentin (NEURONTIN) 300 MG capsule Take 1 capsule (300 mg total) by mouth 3 (three) times daily. 90 capsule 1  . ibuprofen (ADVIL) 800 MG tablet Take 1 tablet (800 mg total) by mouth every 8 (eight) hours as needed for mild pain or cramping. 30 tablet 1  . insulin  glargine (LANTUS) 100 UNIT/ML injection Inject 1 mL (100 Units total) into the skin daily. (Patient taking differently: Inject 100 Units into the skin at bedtime.) 10 mL 3  . insulin lispro (HUMALOG) 100 UNIT/ML injection Inject 0.3 mLs (30 Units total) into the skin 3 (three) times daily before meals. (Patient taking differently: Inject 20 Units into the skin 3 (three) times daily before meals.) 10 mL 1  . Insulin Syringe-Needle U-100 (INSULIN SYRINGE 1CC/31GX5/16") 31G X 5/16" 1 ML MISC     . lisinopril-hydrochlorothiazide (ZESTORETIC) 20-12.5 MG tablet Take 2 tablets by mouth at bedtime. 180 tablet 3  . Na Sulfate-K Sulfate-Mg Sulf 17.5-3.13-1.6 GM/177ML SOLN Take 1 kit by mouth once for 1 dose. 354 mL 0  . traZODone (DESYREL) 50 MG tablet Take 1 tablet (50 mg total) by mouth at bedtime as needed for sleep. 30 tablet 1   No current facility-administered medications for this visit.    Patient confirms/reports the following allergies:  Allergies  Allergen Reactions  . Dilaudid [Hydromorphone Hcl] Nausea And Vomiting  . Morphine And Related Nausea And Vomiting  . Tramadol Nausea Only    If she takes antinausea medicine with this medicine, then she can tolerate it.    Orders Placed This Encounter  Procedures  . Procedural/ Surgical Case Request: COLONOSCOPY WITH PROPOFOL    Standing Status:   Standing    Number of Occurrences:   1    Order Specific Question:   Pre-op diagnosis  Answer:   screening colonoscopy    Order Specific Question:   CPT Code    Answer:   [phone]    AUTHORIZATION INFORMATION Primary Insurance: 1D#: Group #:  Secondary Insurance: 1D#: Group #:  SCHEDULE INFORMATION: Date: Tuesday 02/24/21 Time: Location:[facility]

## 2021-02-24 ENCOUNTER — Other Ambulatory Visit
Admission: RE | Admit: 2021-02-24 | Discharge: 2021-02-24 | Disposition: A | Discharge status: Home / Self Care | Payer: Medicaid Other | Source: Ambulatory Visit | Attending: Gastroenterology | Admitting: Gastroenterology

## 2021-02-24 ENCOUNTER — Other Ambulatory Visit: Payer: Self-pay

## 2021-02-24 DIAGNOSIS — Z01812 Encounter for preprocedural laboratory examination: Secondary | ICD-10-CM | POA: Insufficient documentation

## 2021-02-24 DIAGNOSIS — Z20822 Contact with and (suspected) exposure to covid-19: Secondary | ICD-10-CM | POA: Diagnosis not present

## 2021-02-24 LAB — SARS CORONAVIRUS 2 (TAT 6-24 HRS): SARS Coronavirus 2: NEGATIVE

## 2021-02-26 ENCOUNTER — Encounter: Payer: Self-pay | Admitting: Obstetrics and Gynecology

## 2021-02-26 ENCOUNTER — Encounter
Admission: RE | Disposition: A | Discharge status: Home / Self Care | Payer: Self-pay | Source: Ambulatory Visit | Attending: Gastroenterology

## 2021-02-26 ENCOUNTER — Ambulatory Visit: Payer: Medicaid Other | Admitting: Registered Nurse

## 2021-02-26 ENCOUNTER — Other Ambulatory Visit: Payer: Self-pay

## 2021-02-26 ENCOUNTER — Ambulatory Visit
Admission: RE | Admit: 2021-02-26 | Discharge: 2021-02-26 | Disposition: A | Discharge status: Home / Self Care | Payer: Medicaid Other | Source: Ambulatory Visit | Attending: Gastroenterology | Admitting: Gastroenterology

## 2021-02-26 ENCOUNTER — Encounter: Payer: Self-pay | Admitting: Gastroenterology

## 2021-02-26 DIAGNOSIS — Z85828 Personal history of other malignant neoplasm of skin: Secondary | ICD-10-CM | POA: Insufficient documentation

## 2021-02-26 DIAGNOSIS — Z79899 Other long term (current) drug therapy: Secondary | ICD-10-CM | POA: Diagnosis not present

## 2021-02-26 DIAGNOSIS — Z87891 Personal history of nicotine dependence: Secondary | ICD-10-CM | POA: Diagnosis not present

## 2021-02-26 DIAGNOSIS — Z8249 Family history of ischemic heart disease and other diseases of the circulatory system: Secondary | ICD-10-CM | POA: Diagnosis not present

## 2021-02-26 DIAGNOSIS — Z96651 Presence of right artificial knee joint: Secondary | ICD-10-CM | POA: Insufficient documentation

## 2021-02-26 DIAGNOSIS — Z8349 Family history of other endocrine, nutritional and metabolic diseases: Secondary | ICD-10-CM | POA: Insufficient documentation

## 2021-02-26 DIAGNOSIS — Z832 Family history of diseases of the blood and blood-forming organs and certain disorders involving the immune mechanism: Secondary | ICD-10-CM | POA: Insufficient documentation

## 2021-02-26 DIAGNOSIS — Z8673 Personal history of transient ischemic attack (TIA), and cerebral infarction without residual deficits: Secondary | ICD-10-CM | POA: Insufficient documentation

## 2021-02-26 DIAGNOSIS — Z888 Allergy status to other drugs, medicaments and biological substances status: Secondary | ICD-10-CM | POA: Insufficient documentation

## 2021-02-26 DIAGNOSIS — Z1211 Encounter for screening for malignant neoplasm of colon: Secondary | ICD-10-CM | POA: Diagnosis not present

## 2021-02-26 DIAGNOSIS — Z981 Arthrodesis status: Secondary | ICD-10-CM | POA: Diagnosis not present

## 2021-02-26 DIAGNOSIS — Z885 Allergy status to narcotic agent status: Secondary | ICD-10-CM | POA: Insufficient documentation

## 2021-02-26 DIAGNOSIS — K621 Rectal polyp: Secondary | ICD-10-CM | POA: Insufficient documentation

## 2021-02-26 DIAGNOSIS — K5730 Diverticulosis of large intestine without perforation or abscess without bleeding: Secondary | ICD-10-CM | POA: Insufficient documentation

## 2021-02-26 DIAGNOSIS — K635 Polyp of colon: Secondary | ICD-10-CM

## 2021-02-26 DIAGNOSIS — Z794 Long term (current) use of insulin: Secondary | ICD-10-CM | POA: Diagnosis not present

## 2021-02-26 HISTORY — PX: COLONOSCOPY WITH PROPOFOL: SHX5780

## 2021-02-26 LAB — GLUCOSE, CAPILLARY: Glucose-Capillary: 198 mg/dL — ABNORMAL HIGH (ref 70–99)

## 2021-02-26 LAB — POCT PREGNANCY, URINE: Preg Test, Ur: NEGATIVE

## 2021-02-26 SURGERY — COLONOSCOPY WITH PROPOFOL
Anesthesia: General

## 2021-02-26 MED ORDER — SODIUM CHLORIDE 0.9 % IV SOLN: INTRAVENOUS | Status: DC

## 2021-02-26 MED ORDER — DEXMEDETOMIDINE HCL 200 MCG/2ML IV SOLN
INTRAVENOUS | Status: DC | PRN
  Administered 2021-02-26: 20 ug via INTRAVENOUS

## 2021-02-26 MED ORDER — LIDOCAINE HCL (CARDIAC) PF 100 MG/5ML IV SOSY
PREFILLED_SYRINGE | INTRAVENOUS | Status: DC | PRN
  Administered 2021-02-26: 100 mg via INTRAVENOUS

## 2021-02-26 MED ORDER — PROPOFOL 500 MG/50ML IV EMUL
INTRAVENOUS | Status: DC | PRN
  Administered 2021-02-26: 100 ug/kg/min via INTRAVENOUS

## 2021-02-26 MED ORDER — PROPOFOL 10 MG/ML IV BOLUS
INTRAVENOUS | Status: DC | PRN
  Administered 2021-02-26: 20 mg via INTRAVENOUS
  Administered 2021-02-26: 80 mg via INTRAVENOUS

## 2021-02-26 NOTE — Op Note (Signed)
[facility] Gastroenterology Patient Name: Brenda Hicks Procedure Date: 02/26/2021 7:55 AM MRN: [id number] Account #: [id number] Date of Birth: [birth year] Admit Type: Outpatient Age: 55 Room: [facility] ENDO ROOM 4 Gender: Female Note Status: Finalized Procedure:             Colonoscopy Indications:           Screening for colorectal malignant neoplasm Providers:             Jonathon Bellows MD, MD Referring MD:          Carola Frost. Roxan Hockey (Referring MD) Medicines:             Monitored Anesthesia Care Complications:         No immediate complications. Procedure:             Pre-Anesthesia Assessment:                        - Prior to the procedure, a History and Physical was                         performed, and patient medications, allergies and                         sensitivities were reviewed. The patient's tolerance                         of previous anesthesia was reviewed.                        - The risks and benefits of the procedure and the                         sedation options and risks were discussed with the                         patient. All questions were answered and informed                         consent was obtained.                        - ASA Grade Assessment: II - A patient with mild                         systemic disease.                        After obtaining informed consent, the colonoscope was                         passed under direct vision. Throughout the procedure,                         the patient's blood pressure, pulse, and oxygen                         saturations were monitored continuously. The                         Colonoscope was introduced through the anus and  advanced to the the cecum, identified by the                         appendiceal orifice. The colonoscopy was performed                         with ease. The patient tolerated the procedure well.                         The  quality of the bowel preparation was good. Findings:      The perianal and digital rectal examinations were normal.      Multiple small and large-mouthed diverticula were found in the entire       colon.      A 5 mm polyp was found in the rectum. The polyp was sessile. The polyp       was removed with a cold snare. Resection and retrieval were complete.      The exam was otherwise without abnormality on direct and retroflexion       views. Impression:            - Diverticulosis in the entire examined colon.                        - One 5 mm polyp in the rectum, removed with a cold                         snare. Resected and retrieved.                        - The examination was otherwise normal on direct and                         retroflexion views. Recommendation:        - Discharge patient to home (with escort).                        - Resume previous diet.                        - Continue present medications.                        - Await pathology results.                        - Repeat colonoscopy for surveillance based on                         pathology results. Procedure Code(s):     --- Professional ---                        [phone], Colonoscopy, flexible; with removal of                         tumor(s), polyp(s), or other lesion(s) by snare                         technique Diagnosis Code(s):     --- Professional ---  Z12.11, Encounter for screening for malignant neoplasm                         of colon                        K62.1, Rectal polyp                        K57.30, Diverticulosis of large intestine without                         perforation or abscess without bleeding CPT copyright 2019 American Medical Association. All rights reserved. The codes documented in this report are preliminary and upon coder review may  be revised to meet current compliance requirements. Jonathon Bellows, MD Jonathon Bellows MD, MD 02/26/2021 8:18:57 AM This  report has been signed electronically. Number of Addenda: 0 Note Initiated On: 02/26/2021 7:55 AM Scope Withdrawal Time: 0 hours 12 minutes 39 seconds  Total Procedure Duration: 0 hours 16 minutes 15 seconds  Estimated Blood Loss:  Estimated blood loss: none.      [facility]

## 2021-02-26 NOTE — Transfer of Care (Signed)
Immediate Anesthesia Transfer of Care Note  Patient: Brenda Hicks  Procedure(s) Performed: COLONOSCOPY WITH PROPOFOL (N/A )  Patient Location: PACU  Anesthesia Type:General  Level of Consciousness: sedated  Airway & Oxygen Therapy: Patient Spontanous Breathing and Patient connected to face mask oxygen  Post-op Assessment: Report given to RN and Post -op Vital signs reviewed and stable  Post vital signs: Reviewed and stable  Last Vitals:  Vitals Value Taken Time  BP 143/99 02/26/21 0821  Temp    Pulse 89 02/26/21 0821  Resp 12 02/26/21 0821  SpO2 98 % 02/26/21 0821  Vitals shown include unvalidated device data.  Last Pain:  Vitals:   02/26/21 0707  TempSrc: Temporal  PainSc: 10-Worst pain ever         Complications: No complications documented.

## 2021-02-26 NOTE — Anesthesia Preprocedure Evaluation (Addendum)
Anesthesia Evaluation  Patient identified by MRN, date of birth, ID band Patient awake    Reviewed: Allergy & Precautions, H&P , NPO status , Patient's Chart, lab work & pertinent test results  Airway Mallampati: III  TM Distance: >3 FB Neck ROM: limited    Dental  (+) Edentulous Lower, Edentulous Upper   Pulmonary asthma , sleep apnea , COPD, former smoker,    breath sounds clear to auscultation       Cardiovascular hypertension, + Peripheral Vascular Disease   Rhythm:regular Rate:Normal     Neuro/Psych S/p cervical fusion  Neuromuscular disease (diabetic peripheral neuropathy) CVA, No Residual Symptoms negative psych ROS   GI/Hepatic Neg liver ROS, GERD  ,  Endo/Other  diabetesMorbid obesity  Renal/GU Renal disease (renal cysts)     Musculoskeletal   Abdominal   Peds  Hematology  (+) Blood dyscrasia, anemia ,   Anesthesia Other Findings Past Medical History: No date: Arthritis No date: Asthma     Comment:  WELL CONTROLLED 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     Comment:  skin cancer left ear No date: Diabetes mellitus without complication (HCC) No date: Fatty liver No date: Hypertension No date: Kidney cysts No date: Renal disorder No date: Sleep apnea     Comment:  USES CPAP May or June 2019: Stroke [facility])     Comment:  TIA  Past Surgical History: No date: DILATION AND CURETTAGE OF UTERUS No date: ENDOMETRIAL BIOPSY No date: EXPLORATORY LAPAROTOMY     Comment:  REMOVAL OF RUPTURED ECTOPIC No date: HAND SURGERY No date: HERNIA REPAIR     Comment:  UMBILICAL No date: JOINT REPLACEMENT; Right     Comment:  TKR No date: KNEE ARTHROSCOPY No date: KNEE SURGERY; Right 12/14/2016: SHOULDER ARTHROSCOPY WITH BICEPSTENOTOMY; Left     Comment:  Procedure: SHOULDER ARTHROSCOPY WITH BICEPSTENOTOMY;                Surgeon: Corky Mull, MD;  Location: [facility] ORS;  Service:              Orthopedics;  Laterality:  Left; 12/14/2016: SHOULDER ARTHROSCOPY WITH OPEN ROTATOR CUFF REPAIR; Left     Comment:  Procedure: SHOULDER ARTHROSCOPY WITH OPEN ROTATOR CUFF               REPAIR AND ARTHROSCOPIC ROTATOR CUFF REPAIR;  Surgeon:               Corky Mull, MD;  Location: [facility] ORS;  Service:               Orthopedics;  Laterality: Left; 12/14/2016: SHOULDER ARTHROSCOPY WITH SUBACROMIAL DECOMPRESSION; Left     Comment:  Procedure: SHOULDER ARTHROSCOPY WITH SUBACROMIAL               DECOMPRESSION;  Surgeon: Corky Mull, MD;  Location:               [facility] ORS;  Service: Orthopedics;  Laterality: Left; No date: TUBAL LIGATION  BMI    Body Mass Index:  53.93 kg/m      Reproductive/Obstetrics negative OB ROS                           Anesthesia Physical  Anesthesia Plan  ASA: III  Anesthesia Plan: General   Post-op Pain Management:    Induction:   PONV Risk Score and Plan: TIVA and Propofol infusion  Airway Management Planned: Nasal CPAP  Additional  Equipment:   Intra-op Plan:   Post-operative Plan:   Informed Consent: I have reviewed the patients History and Physical, chart, labs and discussed the procedure including the risks, benefits and alternatives for the proposed anesthesia with the patient or authorized representative who has indicated his/her understanding and acceptance.     Dental Advisory Given  Plan Discussed with: Anesthesiologist, CRNA and Surgeon  Anesthesia Plan Comments:         Anesthesia Quick Evaluation

## 2021-02-26 NOTE — H&P (Signed)
Brenda Bellows, MD [address] [place], [place], [place], [address] [address], [place], [place], [place], [address] Phone: [phone]  Fax: [phone]  Primary Care Physician:  Towanda Malkin, MD (Inactive)   Pre-Procedure History & Physical: HPI:  Brenda Hicks is a 55 y.o. female is here for an colonoscopy.   Past Medical History:  Diagnosis Date  . Anemia    vitamin d deficiency  . Arthritis   . Asthma    WELL CONTROLLED  . 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 COPD (chronic obstructive pulmonary disease) ([place])   . Diabetes mellitus without complication ([place])   . Fatty liver   . Hypertension   . Kidney cysts    per patient, never had  . Renal disorder   . Sleep apnea    DOES NOT USE CPAP. waiting for new machine and a new sleep study  . Stroke [facility]) May or June 2019   TIA. no residual symptoms    Past Surgical History:  Procedure Laterality Date  . ANTERIOR CERVICAL DECOMP/DISCECTOMY FUSION N/A 08/09/2018   Procedure: ANTERIOR CERVICAL DECOMPRESSION/DISCECTOMY FUSION 1 LEVEL- C4-5;  Surgeon: Meade Maw, MD;  Location: [facility] ORS;  Service: Neurosurgery;  Laterality: N/A;  . BACK SURGERY     NECK  . DILATATION & CURETTAGE/HYSTEROSCOPY WITH MYOSURE N/A 01/19/2021   Procedure: DILATATION & CURETTAGE/HYSTEROSCOPY;  Surgeon: Rubie Maid, MD;  Location: [facility] OR[facility];  Service: Gynecology;  Laterality: N/A;  . DILATION AND CURETTAGE OF UTERUS    . ENDOMETRIAL ABLATION N/A 01/19/2021   Procedure: ENDOMETRIAL ABLATION, MINERVA;  Surgeon: Rubie Maid, MD;  Location: [facility] ORS;  Service: Gynecology;  Laterality: N/A;  . ENDOMETRIAL BIOPSY     benign  . EXPLORATORY LAPAROTOMY  1992   REMOVAL OF RUPTURED ECTOPIC  . HAND SURGERY Right 1998   cyst removed  . HERNIA REPAIR  9476   UMBILICAL  . JOINT REPLACEMENT Right 2014   TKR  . KNEE ARTHROSCOPY Right 2012  . KNEE SURGERY Right 2014   total knee replacement  . SHOULDER ARTHROSCOPY WITH  BICEPSTENOTOMY Left 12/14/2016   Procedure: SHOULDER ARTHROSCOPY WITH BICEPSTENOTOMY;  Surgeon: Corky Mull, MD;  Location: [facility];  Service: Orthopedics;  Laterality: Left;  . SHOULDER ARTHROSCOPY WITH OPEN ROTATOR CUFF REPAIR Left 12/14/2016   Procedure: SHOULDER ARTHROSCOPY WITH OPEN ROTATOR CUFF REPAIR AND ARTHROSCOPIC ROTATOR CUFF REPAIR;  Surgeon: Corky Mull, MD;  Location: [facility] OR[facility];  Service: Orthopedics;  Laterality: Left;  . SHOULDER ARTHROSCOPY WITH ROTATOR CUFF REPAIR Right 01/04/2019   Procedure: SHOULDER ARTHROSCOPY WITH ROTATOR CUFF REPAIR;  Surgeon: Corky Mull, MD;  Location: [facility];  Service: Orthopedics;  Laterality: Right;  . SHOULDER ARTHROSCOPY WITH SUBACROMIAL DECOMPRESSION Left 12/14/2016   Procedure: SHOULDER ARTHROSCOPY WITH SUBACROMIAL DECOMPRESSION;  Surgeon: Corky Mull, MD;  Location: [facility];  Service: Orthopedics;  Laterality: Left;  . SHOULDER ARTHROSCOPY WITH SUBACROMIAL DECOMPRESSION AND BICEP TENDON REPAIR Right 01/04/2019   Procedure: SHOULDER ARTHROSCOPY WITH DEBRIDEMENT AND SUBACROMIAL DECOMPRESSION-RIGHT;  Surgeon: Corky Mull, MD;  Location: [facility];  Service: Orthopedics;  Laterality: Right;  . TUBAL LIGATION      Prior to Admission medications   Medication Sig Start Date End Date Taking? Authorizing Provider  ACCU-CHEK GUIDE test strip USE TO CHECK BLOOD SUGAR UP TO FOUR TIMES DAILY AS DIRECTED 01/14/21   [provider]  albuterol (VENTOLIN HFA) 108 (90 Base) MCG/ACT inhaler Inhale 2 puffs  into the lungs every 6 (six) hours as needed for wheezing or shortness of breath. 01/21/21   Myles Gip, DO  atorvastatin (LIPITOR) 40 MG tablet Take 1 tablet (40 mg total) by mouth daily. 01/15/21   Myles Gip, DO  blood glucose meter kit and supplies Dispense based on patient and insurance preference. Use up to four times daily as directed. (FOR ICD-10 E10.9, E11.9). 01/21/21   Myles Gip, DO  cyclobenzaprine (FLEXERIL) 10 MG tablet  Take 1 tablet (10 mg total) by mouth 3 (three) times daily as needed. 11/14/20   Menshew, Dannielle Karvonen, PA-C  Dulaglutide (TRULICITY) 0.73 XT/0.6YI SOPN Inject 0.75 mg into the skin once a week. 02/02/21   Myles Gip, DO  gabapentin (NEURONTIN) 300 MG capsule Take 1 capsule (300 mg total) by mouth 3 (three) times daily. 01/14/21 03/15/21  Myles Gip, DO  ibuprofen (ADVIL) 800 MG tablet Take 1 tablet (800 mg total) by mouth every 8 (eight) hours as needed for mild pain or cramping. 01/19/21   Rubie Maid, MD  insulin glargine (LANTUS) 100 UNIT/ML injection Inject 1 mL (100 Units total) into the skin daily. Patient taking differently: Inject 100 Units into the skin at bedtime. 11/14/20   Menshew, Dannielle Karvonen, PA-C  insulin lispro (HUMALOG) 100 UNIT/ML injection Inject 0.3 mLs (30 Units total) into the skin 3 (three) times daily before meals. Patient taking differently: Inject 20 Units into the skin 3 (three) times daily before meals. 11/14/20   Menshew, Dannielle Karvonen, PA-C  Insulin Syringe-Needle U-100 (INSULIN SYRINGE 1CC/31GX5/16") 31G X 5/16" 1 ML MISC  03/27/19   [provider]  lisinopril-hydrochlorothiazide (ZESTORETIC) 20-12.5 MG tablet Take 2 tablets by mouth at bedtime. Patient not taking: Reported on 02/26/2021 01/14/21   Myles Gip, DO  traZODone (DESYREL) 50 MG tablet Take 1 tablet (50 mg total) by mouth at bedtime as needed for sleep. 11/14/20   Menshew, Dannielle Karvonen, PA-C    Allergies as of 02/11/2021 - Review Complete 02/11/2021  Allergen Reaction Noted  . Dilaudid [hydromorphone hcl] Nausea And Vomiting 01/19/2021  . Morphine and related Nausea And Vomiting 01/04/2019  . Tramadol Nausea Only 12/29/2018    Family History  Problem Relation Age of Onset  . Hypertension Mother   . Thyroid disease Mother   . Lupus Mother   . Congestive Heart Failure Mother   . CAD Maternal Grandmother   . Breast cancer Neg Hx   . Ovarian cancer Neg Hx   . Colon  cancer Neg Hx     Social History   Socioeconomic History  . Marital status: Married    Spouse name: brian  . Number of children: 7  . Years of education: Not on file  . Highest education level: Not on file  Occupational History  . Occupation: disabled    Comment: disabled  Tobacco Use  . Smoking status: Former Smoker    Packs/day: 2.00    Years: 25.00    Pack years: 50.00    Types: Cigarettes    Quit date: 06/09/2017    Years since quitting: 3.7  . Smokeless tobacco: Never Used  Vaping Use  . Vaping Use: Never used  Substance and Sexual Activity  . Alcohol use: No  . Drug use: Yes    Frequency: 7.0 times per week    Types: Marijuana    Comment: Pt. uses marjiuana only when she has severe back and side pain  . Sexual activity:  Yes    Partners: Male    Birth control/protection: None  Other Topics Concern  . Not on file  Social History Narrative   Have 6 biological childrean and custody of niece since 56 months.   Social Determinants of Health   Financial Resource Strain: Not on file  Food Insecurity: Not on file  Transportation Needs: Not on file  Physical Activity: Not on file  Stress: Not on file  Social Connections: Not on file  Intimate Partner Violence: Not on file    Review of Systems: See HPI, otherwise negative ROS  Physical Exam: BP (!) 161/91   Pulse 85   Temp (!) 96 F (35.6 C) (Temporal)   Resp 20   Ht _0  (1.676 m)   Wt (!) 143.8 kg   LMP  (LMP Unknown)   SpO2 98%   BMI 51.17 kg/m  General:   Alert,  pleasant and cooperative in NAD Head:  Normocephalic and atraumatic. Neck:  Supple; no masses or thyromegaly. Lungs:  Clear throughout to auscultation, normal respiratory effort.    Heart:  +S1, +S2, Regular rate and rhythm, No edema. Abdomen:  Soft, nontender and nondistended. Normal bowel sounds, without guarding, and without rebound.   Neurologic:  Alert and  oriented x4;  grossly normal neurologically.  Impression/Plan: Brenda Hicks is here for an colonoscopy to be performed for Screening colonoscopy average risk   Risks, benefits, limitations, and alternatives regarding  colonoscopy have been reviewed with the patient.  Questions have been answered.  All parties agreeable.   Brenda Bellows, MD  02/26/2021, 7:48 AM

## 2021-02-26 NOTE — Anesthesia Postprocedure Evaluation (Signed)
Anesthesia Post Note  Patient: Brenda Hicks  Procedure(s) Performed: COLONOSCOPY WITH PROPOFOL (N/A )  Patient location during evaluation: PACU Anesthesia Type: General Level of consciousness: awake and alert Pain management: pain level controlled Vital Signs Assessment: post-procedure vital signs reviewed and stable Respiratory status: spontaneous breathing, nonlabored ventilation and respiratory function stable Cardiovascular status: blood pressure returned to baseline and stable Postop Assessment: no apparent nausea or vomiting Anesthetic complications: no   No complications documented.   Last Vitals:  Vitals:   02/26/21 0843 02/26/21 0851  BP: (!) 156/94 (!) 139/92  Pulse: 67 69  Resp: 19 15  Temp:    SpO2: 100% 99%    Last Pain:  Vitals:   02/26/21 0851  TempSrc:   PainSc: 0-No pain                 Brenda Hicks Darrie Macmillan

## 2021-02-27 ENCOUNTER — Encounter: Payer: Self-pay | Admitting: Gastroenterology

## 2021-02-27 LAB — SURGICAL PATHOLOGY

## 2021-03-02 ENCOUNTER — Encounter: Payer: Self-pay | Admitting: Gastroenterology

## 2021-03-02 NOTE — Progress Notes (Signed)
error 

## 2021-03-05 ENCOUNTER — Encounter: Payer: Self-pay | Admitting: Podiatry

## 2021-03-05 ENCOUNTER — Other Ambulatory Visit: Payer: Self-pay

## 2021-03-05 ENCOUNTER — Ambulatory Visit: Payer: Medicaid Other | Admitting: Podiatry

## 2021-03-05 DIAGNOSIS — M722 Plantar fascial fibromatosis: Secondary | ICD-10-CM | POA: Insufficient documentation

## 2021-03-05 DIAGNOSIS — M79675 Pain in left toe(s): Secondary | ICD-10-CM | POA: Insufficient documentation

## 2021-03-05 DIAGNOSIS — M79674 Pain in right toe(s): Secondary | ICD-10-CM | POA: Diagnosis not present

## 2021-03-05 DIAGNOSIS — E1142 Type 2 diabetes mellitus with diabetic polyneuropathy: Secondary | ICD-10-CM

## 2021-03-05 DIAGNOSIS — B351 Tinea unguium: Secondary | ICD-10-CM

## 2021-03-05 NOTE — Progress Notes (Signed)
This patient presents  to my office for at risk foot care.  This patient requires this care by a professional since this patient will be at risk due to having diabetes with neuropathy.  This patient is unable to cut nails herself since the patient cannot reach her nails.These nails are painful walking and wearing shoes.  This patient presents for at risk foot care today.  General Appearance  Alert, conversant and in no acute stress.  Vascular  Dorsalis pedis and posterior tibial  pulses are palpable  bilaterally.  Capillary return is within normal limits  bilaterally. Temperature is within normal limits  bilaterally.  Neurologic  Senn-Weinstein monofilament wire test within normal limits  bilaterally. Muscle power within normal limits bilaterally.  Nails Thick disfigured discolored nails with subungual debris  from hallux to fifth toes bilaterally. No evidence of bacterial infection or drainage bilaterally.  Orthopedic  No limitations of motion  feet .  No crepitus or effusions noted.  No bony pathology or digital deformities noted.  Skin  normotropic skin with no porokeratosis noted bilaterally.  No signs of infections or ulcers noted.     Onychomycosis  Pain in right toes  Pain in left toes  Consent was obtained for treatment procedures.   Mechanical debridement of nails 1-5  bilaterally performed with a nail nipper.  Filed with dremel without incident.    Return office visit   3 months                  Told patient to return for periodic foot care and evaluation due to potential at risk complications.   Gardiner Barefoot DPM

## 2021-03-11 ENCOUNTER — Other Ambulatory Visit: Payer: Self-pay

## 2021-03-11 ENCOUNTER — Emergency Department
Admission: EM | Admit: 2021-03-11 | Discharge: 2021-03-11 | Disposition: A | Discharge status: Home / Self Care | Payer: Medicaid Other | Attending: Emergency Medicine | Admitting: Emergency Medicine

## 2021-03-11 ENCOUNTER — Encounter: Payer: Self-pay | Admitting: Emergency Medicine

## 2021-03-11 DIAGNOSIS — Z79899 Other long term (current) drug therapy: Secondary | ICD-10-CM | POA: Insufficient documentation

## 2021-03-11 DIAGNOSIS — Z85828 Personal history of other malignant neoplasm of skin: Secondary | ICD-10-CM | POA: Diagnosis not present

## 2021-03-11 DIAGNOSIS — Z7984 Long term (current) use of oral hypoglycemic drugs: Secondary | ICD-10-CM | POA: Diagnosis not present

## 2021-03-11 DIAGNOSIS — E1169 Type 2 diabetes mellitus with other specified complication: Secondary | ICD-10-CM | POA: Diagnosis not present

## 2021-03-11 DIAGNOSIS — Z96651 Presence of right artificial knee joint: Secondary | ICD-10-CM | POA: Diagnosis not present

## 2021-03-11 DIAGNOSIS — Z794 Long term (current) use of insulin: Secondary | ICD-10-CM | POA: Insufficient documentation

## 2021-03-11 DIAGNOSIS — E1142 Type 2 diabetes mellitus with diabetic polyneuropathy: Secondary | ICD-10-CM | POA: Diagnosis not present

## 2021-03-11 DIAGNOSIS — I10 Essential (primary) hypertension: Secondary | ICD-10-CM | POA: Insufficient documentation

## 2021-03-11 DIAGNOSIS — Y92410 Unspecified street and highway as the place of occurrence of the external cause: Secondary | ICD-10-CM | POA: Diagnosis not present

## 2021-03-11 DIAGNOSIS — J45909 Unspecified asthma, uncomplicated: Secondary | ICD-10-CM | POA: Diagnosis not present

## 2021-03-11 DIAGNOSIS — E785 Hyperlipidemia, unspecified: Secondary | ICD-10-CM | POA: Insufficient documentation

## 2021-03-11 DIAGNOSIS — J449 Chronic obstructive pulmonary disease, unspecified: Secondary | ICD-10-CM | POA: Diagnosis not present

## 2021-03-11 DIAGNOSIS — M5412 Radiculopathy, cervical region: Secondary | ICD-10-CM | POA: Insufficient documentation

## 2021-03-11 DIAGNOSIS — Z8522 Personal history of malignant neoplasm of nasal cavities, middle ear, and accessory sinuses: Secondary | ICD-10-CM | POA: Insufficient documentation

## 2021-03-11 DIAGNOSIS — Z87891 Personal history of nicotine dependence: Secondary | ICD-10-CM | POA: Insufficient documentation

## 2021-03-11 DIAGNOSIS — M542 Cervicalgia: Secondary | ICD-10-CM | POA: Diagnosis present

## 2021-03-11 MED ORDER — METHYLPREDNISOLONE 4 MG PO TBPK: ORAL_TABLET | ORAL | 0 refills | Status: DC

## 2021-03-11 MED ORDER — DEXAMETHASONE 4 MG PO TABS
8.0000 mg | ORAL_TABLET | Freq: Once | ORAL | Status: AC
  Administered 2021-03-11: 8 mg via ORAL
  Filled 2021-03-11: qty 2
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
## 2021-03-11 NOTE — ED Notes (Signed)
Documentation completed by Ricke Hey SRN reviewed and approved by this RN

## 2021-03-11 NOTE — Discharge Instructions (Addendum)
Take steroid taper as prescribed. While on steroids it is possible that your sugars will get high. Continue to take your lantus at bedtime as prescribed. Before meals, stops taking Humalog 20 units, instaead check your blood glucose and follow the below sliding scale to determine how much insulin to give yourself:  Blood glucose 70 - 120: 0 units Blood glucose 121 - 150: 3 units Blood glucose 151 - 200: 4 units Blood glucose 201 - 250: 7 units Blood glucose 251 - 300: 11 units Blood glucose 301 - 350: 15 units Blood glucose 351 - 400: 20 units CBG > 400: call your doctor or go the emergency room   Follow-up with your primary care doctor about your neck pain and return to the emergency room for new or worsening pain, chest pain, weakness or numbness of your arm.

## 2021-03-13 ENCOUNTER — Ambulatory Visit (INDEPENDENT_AMBULATORY_CARE_PROVIDER_SITE_OTHER): Payer: Medicaid Other

## 2021-03-13 ENCOUNTER — Ambulatory Visit (INDEPENDENT_AMBULATORY_CARE_PROVIDER_SITE_OTHER): Payer: Medicaid Other | Admitting: Cardiology

## 2021-03-13 ENCOUNTER — Encounter: Payer: Self-pay | Admitting: Cardiology

## 2021-03-13 ENCOUNTER — Other Ambulatory Visit: Payer: Self-pay

## 2021-03-13 VITALS — BP 156/106 | HR 82 | Ht 66.0 in | Wt 317.0 lb

## 2021-03-13 DIAGNOSIS — R002 Palpitations: Secondary | ICD-10-CM

## 2021-03-13 DIAGNOSIS — E7800 Pure hypercholesterolemia, unspecified: Secondary | ICD-10-CM | POA: Diagnosis not present

## 2021-03-13 DIAGNOSIS — I10 Essential (primary) hypertension: Secondary | ICD-10-CM | POA: Diagnosis not present

## 2021-03-13 DIAGNOSIS — R072 Precordial pain: Secondary | ICD-10-CM

## 2021-03-13 NOTE — Patient Instructions (Signed)
Medication Instructions:   1.  START taking Protonix 40 MG once a day.  *If you need a refill on your cardiac medications before your next appointment, please call your pharmacy*   Lab Work: None ordered If you have labs (blood work) drawn today and your tests are completely normal, you will receive your results only by: Marland Kitchen MyChart Message (if you have MyChart) OR . A paper copy in the mail If you have any lab test that is abnormal or we need to change your treatment, we will call you to review the results.   Testing/Procedures:  1.  .Your physician has recommended that you wear a Zio XT monitor for 2 weeks.   This monitor is a medical device that records the heart's electrical activity. Doctors most often use these monitors to diagnose arrhythmias. Arrhythmias are problems with the speed or rhythm of the heartbeat. The monitor is a small device applied to your chest. You can wear one while you do your normal daily activities. While wearing this monitor if you have any symptoms to push the button and record what you felt. Once you have worn this monitor for the period of time provider prescribed (Usually 14 days), you will return the monitor device in the postage paid box. Once it is returned they will download the data collected and provide [place] with a report which the provider will then review and we will call you with those results. Important tips:  1. Avoid showering during the first 24 hours of wearing the monitor. 2. Avoid excessive sweating to help maximize wear time. 3. Do not submerge the device, no hot tubs, and no swimming pools. 4. Keep any lotions or oils away from the patch. 5. After 24 hours you may shower with the patch on. Take brief showers with your back facing the shower head.  6. Do not remove patch once it has been placed because that will interrupt data and decrease adhesive wear time. 7. Push the button when you have any symptoms and write down what you were  feeling. 8. Once you have completed wearing your monitor, remove and place into box which has postage paid and place in your outgoing mailbox.  9. If for some reason you have misplaced your box then call our office and we can provide another box and/or mail it off for you.        Follow-Up: At [facility], you and your health needs are our priority.  As part of our continuing mission to provide you with exceptional heart care, we have created designated Provider Care Teams.  These Care Teams include your primary Cardiologist (physician) and Advanced Practice Providers (APPs -  Physician Assistants and Nurse Practitioners) who all work together to provide you with the care you need, when you need it.  We recommend signing up for the patient portal called "MyChart".  Sign up information is provided on this After Visit Summary.  MyChart is used to connect with patients for Virtual Visits (Telemedicine).  Patients are able to view lab/test results, encounter notes, upcoming appointments, etc.  Non-urgent messages can be sent to your provider as well.   To learn more about what you can do with MyChart, go to [email or web address].    Your next appointment:   6 week(s)  The format for your next appointment:   In Person  Provider:   Kate Sable, MD   Other Instructions

## 2021-03-13 NOTE — Progress Notes (Signed)
Cardiology Office Note:    Date:  03/13/2021   ID:  Brenda Hicks, DOB 1966/05/31, MRN [id number]  PCP:  Towanda Malkin, MD   [place]  Cardiologist:  Kate Sable, MD  Advanced Practice Provider:  No care team member to display Electrophysiologist:  None       Referring MD: Philip Aspen, CNM   Chief Complaint  Patient presents with  . New Patient (Initial Visit)    Referred by Philip Aspen, CNM for chest Tightness/Discomfort and Palpitations    History of Present Illness:    Brenda Hicks is a 55 y.o. female with a hx of hypertension, hyperlipidemia, former smoker, morbid obesity, diabetes who presents due to palpitations and sharp chest discomfort.  Symptoms of palpitations have been ongoing for 6 months.  Typically occurs when she is laying down, waking her up from sleep.  She endorsed having sharp chest discomfort associated with palpitations.  Symptoms of palpitations typically occur for times a week, lasting a couple of minutes.  Denies dizziness, or syncope associated with palpitations.  Sharp chest pain feels like reflux, and patient sometimes feels nauseous.  Past Medical History:  Diagnosis Date  . Anemia    vitamin d deficiency  . Arthritis   . Asthma    WELL CONTROLLED  . 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 COPD (chronic obstructive pulmonary disease) ([place])   . Diabetes mellitus without complication ([place])   . Fatty liver   . Hypertension   . Kidney cysts    per patient, never had  . Renal disorder   . Sleep apnea    DOES NOT USE CPAP. waiting for new machine and a new sleep study  . Stroke [facility]) May or June 2019   TIA. no residual symptoms    Past Surgical History:  Procedure Laterality Date  . ANTERIOR CERVICAL DECOMP/DISCECTOMY FUSION N/A 08/09/2018   Procedure: ANTERIOR CERVICAL DECOMPRESSION/DISCECTOMY FUSION 1 LEVEL- C4-5;  Surgeon: Meade Maw, MD;  Location: [facility] ORS;  Service:  Neurosurgery;  Laterality: N/A;  . BACK SURGERY     NECK  . COLONOSCOPY WITH PROPOFOL N/A 02/26/2021   Procedure: COLONOSCOPY WITH PROPOFOL;  Surgeon: Jonathon Bellows, MD;  Location: [facility] ENDOSCOPY;  Service: Endoscopy;  Laterality: N/A;  . DILATATION & CURETTAGE/HYSTEROSCOPY WITH MYOSURE N/A 01/19/2021   Procedure: DILATATION & CURETTAGE/HYSTEROSCOPY;  Surgeon: Rubie Maid, MD;  Location: [facility] ORS;  Service: Gynecology;  Laterality: N/A;  . DILATION AND CURETTAGE OF UTERUS    . ENDOMETRIAL ABLATION N/A 01/19/2021   Procedure: ENDOMETRIAL ABLATION, MINERVA;  Surgeon: Rubie Maid, MD;  Location: [facility] ORS;  Service: Gynecology;  Laterality: N/A;  . ENDOMETRIAL BIOPSY     benign  . EXPLORATORY LAPAROTOMY  1992   REMOVAL OF RUPTURED ECTOPIC  . HAND SURGERY Right 1998   cyst removed  . HERNIA REPAIR  7829   UMBILICAL  . JOINT REPLACEMENT Right 2014   TKR  . KNEE ARTHROSCOPY Right 2012  . KNEE SURGERY Right 2014   total knee replacement  . SHOULDER ARTHROSCOPY WITH BICEPSTENOTOMY Left 12/14/2016   Procedure: SHOULDER ARTHROSCOPY WITH BICEPSTENOTOMY;  Surgeon: Corky Mull, MD;  Location: [facility] ORS;  Service: Orthopedics;  Laterality: Left;  . SHOULDER ARTHROSCOPY WITH OPEN ROTATOR CUFF REPAIR Left 12/14/2016   Procedure: SHOULDER ARTHROSCOPY WITH OPEN ROTATOR CUFF REPAIR AND ARTHROSCOPIC ROTATOR CUFF REPAIR;  Surgeon: Corky Mull, MD;  Location: [facility] ORS;  Service: Orthopedics;  Laterality: Left;  .  SHOULDER ARTHROSCOPY WITH ROTATOR CUFF REPAIR Right 01/04/2019   Procedure: SHOULDER ARTHROSCOPY WITH ROTATOR CUFF REPAIR;  Surgeon: Corky Mull, MD;  Location: [facility] ORS;  Service: Orthopedics;  Laterality: Right;  . SHOULDER ARTHROSCOPY WITH SUBACROMIAL DECOMPRESSION Left 12/14/2016   Procedure: SHOULDER ARTHROSCOPY WITH SUBACROMIAL DECOMPRESSION;  Surgeon: Corky Mull, MD;  Location: [facility] ORS;  Service: Orthopedics;  Laterality: Left;  . SHOULDER ARTHROSCOPY WITH SUBACROMIAL DECOMPRESSION AND BICEP  TENDON REPAIR Right 01/04/2019   Procedure: SHOULDER ARTHROSCOPY WITH DEBRIDEMENT AND SUBACROMIAL DECOMPRESSION-RIGHT;  Surgeon: Corky Mull, MD;  Location: [facility] ORS;  Service: Orthopedics;  Laterality: Right;  . TUBAL LIGATION      Current Medications: Current Meds  Medication Sig  . ACCU-CHEK GUIDE test strip USE TO CHECK BLOOD SUGAR UP TO FOUR TIMES DAILY AS DIRECTED  . albuterol (VENTOLIN HFA) 108 (90 Base) MCG/ACT inhaler Inhale 2 puffs into the lungs every 6 (six) hours as needed for wheezing or shortness of breath.  Marland Kitchen atorvastatin (LIPITOR) 40 MG tablet Take 1 tablet (40 mg total) by mouth daily.  . blood glucose meter kit and supplies Dispense based on patient and insurance preference. Use up to four times daily as directed. (FOR ICD-10 E10.9, E11.9).  . cyclobenzaprine (FLEXERIL) 10 MG tablet Take 1 tablet (10 mg total) by mouth 3 (three) times daily as needed.  . gabapentin (NEURONTIN) 300 MG capsule Take 1 capsule (300 mg total) by mouth 3 (three) times daily.  Marland Kitchen ibuprofen (ADVIL) 800 MG tablet Take 1 tablet (800 mg total) by mouth every 8 (eight) hours as needed for mild pain or cramping.  . insulin glargine (LANTUS) 100 UNIT/ML injection Inject 100 Units into the skin daily.  . insulin lispro (HUMALOG) 100 UNIT/ML injection Inject 30 Units into the skin 3 (three) times daily before meals.  . Insulin Syringe-Needle U-100 (INSULIN SYRINGE 1CC/31GX5/16") 31G X 5/16" 1 ML MISC   . lisinopril-hydrochlorothiazide (ZESTORETIC) 20-12.5 MG tablet Take 2 tablets by mouth at bedtime.  . ondansetron (ZOFRAN) 4 MG tablet Take 1 tablet by mouth as needed.  . OXYCONTIN 10 MG 12 hr tablet Take 1 tablet by mouth every 12 (twelve) hours.     Allergies:   Dilaudid [hydromorphone hcl], Morphine and related, and Tramadol   Social History   Socioeconomic History  . Marital status: Married    Spouse name: Annibelle Brazie  . Number of children: 7  . Years of education: Not on file  . Highest education  level: Not on file  Occupational History  . Occupation: disabled    Comment: disabled  Tobacco Use  . Smoking status: Former Smoker    Packs/day: 2.00    Years: 25.00    Pack years: 50.00    Types: Cigarettes    Quit date: 06/09/2017    Years since quitting: 3.7  . Smokeless tobacco: Never Used  Vaping Use  . Vaping Use: Never used  Substance and Sexual Activity  . Alcohol use: No  . Drug use: Yes    Frequency: 7.0 times per week    Types: Marijuana    Comment: Pt. uses marjiuana only when she has severe back and side pain  . Sexual activity: Yes    Partners: Male    Birth control/protection: None  Other Topics Concern  . Not on file  Social History Narrative   Have 6 biological childrean and custody of niece since 53 months.   Social Determinants of Health   Financial Resource Strain: Not on file  Food Insecurity: Not on file  Transportation Needs: Not on file  Physical Activity: Not on file  Stress: Not on file  Social Connections: Not on file     Family History: The patient's family history includes CAD in her maternal grandmother; Congestive Heart Failure in her mother; Hypertension in her mother; Lupus in her mother; Thyroid disease in her mother. There is no history of Breast cancer, Ovarian cancer, or Colon cancer.  ROS:   Please see the history of present illness.     All other systems reviewed and are negative.  EKGs/Labs/Other Studies Reviewed:    The following studies were reviewed today:   EKG:  EKG is  ordered today.  The ekg ordered today demonstrates normal sinus rhythm  Recent Labs: 04/25/2020: ALT 16 01/16/2021: BUN 11; Creatinine, Ser 0.84; Hemoglobin 11.7; Platelets 244; Potassium 3.7; Sodium 137  Recent Lipid Panel    Component Value Date/Time   CHOL 164 01/14/2021 1613   TRIG 126 01/14/2021 1613   HDL 37 (L) 01/14/2021 1613   CHOLHDL 4.4 01/14/2021 1613   VLDL 20 03/31/2018 0539   LDLCALC 104 (H) 01/14/2021 1613     Risk  Assessment/Calculations:      Physical Exam:    VS:  BP (!) 156/106   Pulse 82   Ht '5\' 6"'  (1.676 m)   Wt (!) 317 lb (143.8 kg)   BMI 51.17 kg/m     Wt Readings from Last 3 Encounters:  03/13/21 (!) 317 lb (143.8 kg)  03/11/21 (!) 317 lb (143.8 kg)  02/26/21 (!) 317 lb (143.8 kg)     GEN:  Well nourished, well developed in no acute distress HEENT: Normal NECK: No JVD; No carotid bruits LYMPHATICS: No lymphadenopathy CARDIAC: RRR, no murmurs, rubs, gallops RESPIRATORY:  Clear to auscultation without rales, wheezing or rhonchi  ABDOMEN: Soft, non-tender, non-distended MUSCULOSKELETAL:  No edema; No deformity  SKIN: Warm and dry NEUROLOGIC:  Alert and oriented x 3 PSYCHIATRIC:  Normal affect   ASSESSMENT:    1. Palpitations   2. Precordial pain   3. Primary hypertension   4. Pure hypercholesterolemia   5. Morbid obesity ([place])    PLAN:    In order of problems listed above:  1. Palpitations, placed 2-week cardiac monitor to evaluate any significant arrhythmias. 2. Chest pain, appears non anginal, has risk factors for heart disease.  Possible GI etiology.  Start Protonix 40 mg daily, obtain echocardiogram. 3. Hypertension, BP elevated today, typically better.  Continue current BP meds.  Consider titration if blood pressure stays elevated at follow-up visit. 4. Hyperlipidemia, continue statin. 5. Morbid obesity, weight loss recommended.  Follow-up after echo, cardiac monitor.     Medication Adjustments/Labs and Tests Ordered: Current medicines are reviewed at length with the patient today.  Concerns regarding medicines are outlined above.  Orders Placed This Encounter  Procedures  . LONG TERM MONITOR (3-14 DAYS)  . EKG 12-Lead   No orders of the defined types were placed in this encounter.   Patient Instructions  Medication Instructions:   1.  START taking Protonix 40 MG once a day.  *If you need a refill on your cardiac medications before your next  appointment, please call your pharmacy*   Lab Work: None ordered If you have labs (blood work) drawn today and your tests are completely normal, you will receive your results only by: Marland Kitchen MyChart Message (if you have MyChart) OR . A paper copy in the mail If you have any lab  test that is abnormal or we need to change your treatment, we will call you to review the results.   Testing/Procedures:  1.  .Your physician has recommended that you wear a Zio XT monitor for 2 weeks.   This monitor is a medical device that records the heart's electrical activity. Doctors most often use these monitors to diagnose arrhythmias. Arrhythmias are problems with the speed or rhythm of the heartbeat. The monitor is a small device applied to your chest. You can wear one while you do your normal daily activities. While wearing this monitor if you have any symptoms to push the button and record what you felt. Once you have worn this monitor for the period of time provider prescribed (Usually 14 days), you will return the monitor device in the postage paid box. Once it is returned they will download the data collected and provide [place] with a report which the provider will then review and we will call you with those results. Important tips:  1. Avoid showering during the first 24 hours of wearing the monitor. 2. Avoid excessive sweating to help maximize wear time. 3. Do not submerge the device, no hot tubs, and no swimming pools. 4. Keep any lotions or oils away from the patch. 5. After 24 hours you may shower with the patch on. Take brief showers with your back facing the shower head.  6. Do not remove patch once it has been placed because that will interrupt data and decrease adhesive wear time. 7. Push the button when you have any symptoms and write down what you were feeling. 8. Once you have completed wearing your monitor, remove and place into box which has postage paid and place in your outgoing mailbox.  9. If  for some reason you have misplaced your box then call our office and we can provide another box and/or mail it off for you.        Follow-Up: At [facility], you and your health needs are our priority.  As part of our continuing mission to provide you with exceptional heart care, we have created designated Provider Care Teams.  These Care Teams include your primary Cardiologist (physician) and Advanced Practice Providers (APPs -  Physician Assistants and Nurse Practitioners) who all work together to provide you with the care you need, when you need it.  We recommend signing up for the patient portal called "MyChart".  Sign up information is provided on this After Visit Summary.  MyChart is used to connect with patients for Virtual Visits (Telemedicine).  Patients are able to view lab/test results, encounter notes, upcoming appointments, etc.  Non-urgent messages can be sent to your provider as well.   To learn more about what you can do with MyChart, go to [email or web address].    Your next appointment:   6 week(s)  The format for your next appointment:   In Person  Provider:   Kate Sable, MD   Other Instructions      Signed, Kate Sable, MD  03/13/2021 1:03 PM    [place]

## 2021-03-17 ENCOUNTER — Telehealth: Payer: Self-pay | Admitting: Cardiology

## 2021-03-17 NOTE — Telephone Encounter (Signed)
Patient calling  States that she woke up this morning and her monitor had fallen off  Would like to know if she should have it replaced  Please call to discuss

## 2021-03-17 NOTE — Telephone Encounter (Signed)
Called patient back and was unable to leave a VM.  Called and spoke with Brenda Hicks from [employer] [facility] and they will be sending a new monitor to the patient that will arrive tomorrow. They requested that the patient send the one in that fell off and they will download the 3 days that were on there then will not charge for the new monitor being sent out.

## 2021-03-17 NOTE — Telephone Encounter (Signed)
Called patient and informed her of the below information. Patient verbalized understanding, agreed with plan, and was grateful for the phone call.

## 2021-03-20 ENCOUNTER — Telehealth: Payer: Self-pay | Admitting: Obstetrics and Gynecology

## 2021-03-20 NOTE — Telephone Encounter (Signed)
Patient called stating that she needs documentation of her recent hysterectomy to prove to Social Security that she did have surgery performed. Patient was saying that she would swing by Monday - I told her to wait until someone contacts her to let her know documentation is ready. Please Advise.

## 2021-03-23 NOTE — Telephone Encounter (Signed)
Pt is aware that her records are ready for pick up at the front desk.

## 2021-03-31 ENCOUNTER — Other Ambulatory Visit: Payer: Self-pay | Admitting: Anesthesiology

## 2021-03-31 DIAGNOSIS — M25511 Pain in right shoulder: Secondary | ICD-10-CM

## 2021-03-31 DIAGNOSIS — M542 Cervicalgia: Secondary | ICD-10-CM

## 2021-04-14 ENCOUNTER — Ambulatory Visit
Admission: RE | Admit: 2021-04-14 | Discharge: 2021-04-14 | Disposition: A | Discharge status: Home / Self Care | Payer: Medicaid Other | Source: Ambulatory Visit | Attending: Anesthesiology | Admitting: Anesthesiology

## 2021-04-14 ENCOUNTER — Other Ambulatory Visit: Payer: Self-pay

## 2021-04-14 DIAGNOSIS — M542 Cervicalgia: Secondary | ICD-10-CM | POA: Diagnosis not present

## 2021-04-14 DIAGNOSIS — M25511 Pain in right shoulder: Secondary | ICD-10-CM | POA: Diagnosis present

## 2021-04-14 MED ORDER — GADOBUTROL 1 MMOL/ML IV SOLN
10.0000 mL | Freq: Once | INTRAVENOUS | Status: AC | PRN
  Administered 2021-04-14: 10 mL via INTRAVENOUS

## 2021-04-23 ENCOUNTER — Encounter: Payer: Self-pay | Admitting: Emergency Medicine

## 2021-04-23 ENCOUNTER — Emergency Department
Admission: EM | Admit: 2021-04-23 | Discharge: 2021-04-23 | Disposition: A | Discharge status: Home / Self Care | Payer: Medicaid Other | Attending: Emergency Medicine | Admitting: Emergency Medicine

## 2021-04-23 ENCOUNTER — Other Ambulatory Visit: Payer: Self-pay

## 2021-04-23 ENCOUNTER — Emergency Department: Payer: Medicaid Other

## 2021-04-23 DIAGNOSIS — E1169 Type 2 diabetes mellitus with other specified complication: Secondary | ICD-10-CM | POA: Diagnosis not present

## 2021-04-23 DIAGNOSIS — E1142 Type 2 diabetes mellitus with diabetic polyneuropathy: Secondary | ICD-10-CM | POA: Diagnosis not present

## 2021-04-23 DIAGNOSIS — Z79899 Other long term (current) drug therapy: Secondary | ICD-10-CM | POA: Insufficient documentation

## 2021-04-23 DIAGNOSIS — J449 Chronic obstructive pulmonary disease, unspecified: Secondary | ICD-10-CM | POA: Diagnosis not present

## 2021-04-23 DIAGNOSIS — Z85828 Personal history of other malignant neoplasm of skin: Secondary | ICD-10-CM | POA: Diagnosis not present

## 2021-04-23 DIAGNOSIS — Z96651 Presence of right artificial knee joint: Secondary | ICD-10-CM | POA: Insufficient documentation

## 2021-04-23 DIAGNOSIS — S199XXA Unspecified injury of neck, initial encounter: Secondary | ICD-10-CM | POA: Diagnosis present

## 2021-04-23 DIAGNOSIS — Z87891 Personal history of nicotine dependence: Secondary | ICD-10-CM | POA: Diagnosis not present

## 2021-04-23 DIAGNOSIS — Y92410 Unspecified street and highway as the place of occurrence of the external cause: Secondary | ICD-10-CM | POA: Diagnosis not present

## 2021-04-23 DIAGNOSIS — I10 Essential (primary) hypertension: Secondary | ICD-10-CM | POA: Diagnosis not present

## 2021-04-23 DIAGNOSIS — E785 Hyperlipidemia, unspecified: Secondary | ICD-10-CM | POA: Diagnosis not present

## 2021-04-23 DIAGNOSIS — S161XXA Strain of muscle, fascia and tendon at neck level, initial encounter: Secondary | ICD-10-CM | POA: Diagnosis not present

## 2021-04-23 DIAGNOSIS — Z794 Long term (current) use of insulin: Secondary | ICD-10-CM | POA: Insufficient documentation

## 2021-04-23 DIAGNOSIS — M7918 Myalgia, other site: Secondary | ICD-10-CM

## 2021-04-23 MED ORDER — KETOROLAC TROMETHAMINE 10 MG PO TABS: 10.0000 mg | ORAL_TABLET | Freq: Four times a day (QID) | ORAL | 0 refills | Status: DC | PRN

## 2021-04-23 MED ORDER — OXYCODONE-ACETAMINOPHEN 5-325 MG PO TABS: 1.0000 | ORAL_TABLET | Freq: Four times a day (QID) | ORAL | 0 refills | Status: DC | PRN

## 2021-04-23 MED ORDER — KETOROLAC TROMETHAMINE 60 MG/2ML IM SOLN
60.0000 mg | Freq: Once | INTRAMUSCULAR | Status: AC
  Administered 2021-04-23: 60 mg via INTRAMUSCULAR
  Filled 2021-04-23: qty 2

## 2021-04-23 MED ORDER — ORPHENADRINE CITRATE 30 MG/ML IJ SOLN
60.0000 mg | Freq: Two times a day (BID) | INTRAMUSCULAR | Status: DC
  Administered 2021-04-23: 60 mg via INTRAMUSCULAR
  Filled 2021-04-23: qty 2

## 2021-04-23 MED ORDER — ORPHENADRINE CITRATE ER 100 MG PO TB12: 100.0000 mg | ORAL_TABLET | Freq: Two times a day (BID) | ORAL | 0 refills | Status: DC

## 2021-04-23 MED ORDER — LIDOCAINE 5 % EX PTCH
1.0000 | MEDICATED_PATCH | CUTANEOUS | Status: DC
  Administered 2021-04-23: 1 via TRANSDERMAL
  Filled 2021-04-23: qty 1

## 2021-04-23 NOTE — Discharge Instructions (Addendum)
No acute findings on x-ray of the cervical spine.  Read and follow discharge care instruction.  Wear Lidoderm patch for 12 hours.  Take medication as directed.
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
## 2021-04-28 ENCOUNTER — Other Ambulatory Visit: Payer: Self-pay

## 2021-04-28 ENCOUNTER — Ambulatory Visit (INDEPENDENT_AMBULATORY_CARE_PROVIDER_SITE_OTHER): Payer: Medicaid Other | Admitting: Cardiology

## 2021-04-28 ENCOUNTER — Encounter: Payer: Self-pay | Admitting: Cardiology

## 2021-04-28 VITALS — BP 138/80 | HR 60 | Ht 66.0 in | Wt 319.0 lb

## 2021-04-28 DIAGNOSIS — I10 Essential (primary) hypertension: Secondary | ICD-10-CM

## 2021-04-28 DIAGNOSIS — R072 Precordial pain: Secondary | ICD-10-CM | POA: Diagnosis not present

## 2021-04-28 DIAGNOSIS — I471 Supraventricular tachycardia: Secondary | ICD-10-CM
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
## 2021-04-28 NOTE — Patient Instructions (Signed)
Medication Instructions:  Your physician recommends that you continue on your current medications as directed. Please refer to the Current Medication list given to you today.  *If you need a refill on your cardiac medications before your next appointment, please call your pharmacy*   Lab Work: None ordered If you have labs (blood work) drawn today and your tests are completely normal, you will receive your results only by: Marland Kitchen MyChart Message (if you have MyChart) OR . A paper copy in the mail If you have any lab test that is abnormal or we need to change your treatment, we will call you to review the results.   Testing/Procedures: None ordered   Follow-Up: At [facility], you and your health needs are our priority.  As part of our continuing mission to provide you with exceptional heart care, we have created designated Provider Care Teams.  These Care Teams include your primary Cardiologist (physician) and Advanced Practice Providers (APPs -  Physician Assistants and Nurse Practitioners) who all work together to provide you with the care you need, when you need it.  We recommend signing up for the patient portal called "MyChart".  Sign up information is provided on this After Visit Summary.  MyChart is used to connect with patients for Virtual Visits (Telemedicine).  Patients are able to view lab/test results, encounter notes, upcoming appointments, etc.  Non-urgent messages can be sent to your provider as well.   To learn more about what you can do with MyChart, go to [email or web address].    Your next appointment:   Your physician wants you to follow-up in: 6 months You will receive a reminder letter in the mail two months in advance. If you don't receive a letter, please call our office to schedule the follow-up appointment.   The format for your next appointment:   In Person  Provider:   You may see Kate Sable, MD or one of the following Advanced Practice Providers on  your designated Care Team:    Murray Hodgkins, NP  Christell Faith, PA-C  Marrianne Mood, PA-C  Cadence [place], [place]  Laurann Montana, NP    Other Instructions N/A

## 2021-06-04 ENCOUNTER — Ambulatory Visit: Payer: Medicaid Other | Admitting: Podiatry

## 2021-06-10 ENCOUNTER — Other Ambulatory Visit: Payer: Self-pay

## 2021-06-10 ENCOUNTER — Encounter: Payer: Self-pay | Admitting: Family Medicine

## 2021-06-10 ENCOUNTER — Ambulatory Visit (INDEPENDENT_AMBULATORY_CARE_PROVIDER_SITE_OTHER): Payer: Medicaid Other | Admitting: Family Medicine

## 2021-06-10 ENCOUNTER — Telehealth: Payer: Self-pay | Admitting: Family Medicine

## 2021-06-10 VITALS — BP 134/88 | HR 94 | Temp 98.2°F | Resp 16 | Ht 62.0 in | Wt 318.0 lb

## 2021-06-10 DIAGNOSIS — Z794 Long term (current) use of insulin: Secondary | ICD-10-CM

## 2021-06-10 DIAGNOSIS — E1169 Type 2 diabetes mellitus with other specified complication: Secondary | ICD-10-CM

## 2021-06-10 DIAGNOSIS — G959 Disease of spinal cord, unspecified: Secondary | ICD-10-CM | POA: Diagnosis not present

## 2021-06-10 DIAGNOSIS — E785 Hyperlipidemia, unspecified: Secondary | ICD-10-CM

## 2021-06-10 DIAGNOSIS — E1165 Type 2 diabetes mellitus with hyperglycemia: Secondary | ICD-10-CM | POA: Diagnosis not present

## 2021-06-10 DIAGNOSIS — M4722 Other spondylosis with radiculopathy, cervical region: Secondary | ICD-10-CM

## 2021-06-10 DIAGNOSIS — G4733 Obstructive sleep apnea (adult) (pediatric): Secondary | ICD-10-CM | POA: Diagnosis not present

## 2021-06-10 DIAGNOSIS — Z6841 Body Mass Index (BMI) 40.0 and over, adult: Secondary | ICD-10-CM

## 2021-06-10 DIAGNOSIS — I10 Essential (primary) hypertension: Secondary | ICD-10-CM

## 2021-06-10 LAB — POCT GLYCOSYLATED HEMOGLOBIN (HGB A1C): Hemoglobin A1C: 9.6 % — AB (ref 4.0–5.6)

## 2021-06-10 MED ORDER — OXYCONTIN 10 MG PO T12A: 10.0000 mg | EXTENDED_RELEASE_TABLET | Freq: Two times a day (BID) | ORAL | 0 refills | Status: AC | PRN

## 2021-06-10 MED ORDER — ACCU-CHEK GUIDE VI STRP: ORAL_STRIP | 3 refills | Status: DC

## 2021-06-10 MED ORDER — ACCU-CHEK MULTICLIX LANCETS MISC: 12 refills | Status: DC

## 2021-06-10 MED ORDER — "INSULIN SYRINGE 31G X 5/16"" 1 ML MISC": 3 refills | Status: DC

## 2021-06-10 NOTE — Assessment & Plan Note (Signed)
Remains uncontrolled.  No changes made today given she has not taken trulicity, instructed to pick up refill from pharmacy. No samples available today to give. Gave number to schedule f/u with Endo. Encouraged compliance with statin. Sent refills for test strips, lancets, insulin needles.

## 2021-06-10 NOTE — Assessment & Plan Note (Signed)
Encouraged compliance with statin.

## 2021-06-10 NOTE — Assessment & Plan Note (Signed)
Has been without CPAP, ordered sleep study today.

## 2021-06-10 NOTE — Assessment & Plan Note (Signed)
Doing well on current regimen, no changes made today. 
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
## 2021-06-10 NOTE — Patient Instructions (Addendum)
It was great to see you!  Our plans for today:  - Check with the pharmacy about your trulicity and cholesterol prescriptions as well as your insulin needles, test strips.  - We do not prescribe narcotics for chronic pain. I am sending a new referral to pain management. Let us know if you don't hear about an appointment in the next few weeks. I will provide you enough oxycontin to last until your referral to go through.  - We referred you for a sleep study.  - Call endocrinology at [phone] for an appointment.  Take care and seek immediate care sooner if you develop any concerns.   Dr. Ky Barban   Diet Recommendations for Diabetes   1. Eat at least 3 meals and 1-2 snacks per day. Never go more than 4-5 hours while awake without eating. Eat breakfast within the first hour of getting up.   2. Limit starchy foods to TWO per meal and ONE per snack. ONE portion of a starchy  food is equal to the following:   - ONE slice of bread (or its equivalent, such as half of a hamburger bun).   - 1/2 cup of a "scoopable" starchy food such as potatoes or rice.   - 15 grams of Total Carbohydrate as shown on food label.  3. Include at every meal: a protein food, a carb food, and vegetables and/or fruit.   - Obtain twice the volume of vegetables as protein or carbohydrate foods for both lunch and dinner.   - Fresh or frozen vegetables are best.   - Keep frozen vegetables on hand for a quick vegetable serving.       Starchy (carb) foods: Bread, rice, pasta, potatoes, corn, cereal, grits, crackers, bagels, muffins, all baked goods.  (Fruits, milk, and yogurt also have carbohydrate, but most of these foods will not spike your blood sugar as most starchy foods will.)  A few fruits do cause high blood sugars; use small portions of bananas (limit to 1/2 at a time), grapes, watermelon, oranges, and most tropical fruits.    Protein foods: Meat, fish, poultry, eggs, dairy foods, and beans such as pinto and kidney  beans (beans also provide carbohydrate).      Here is an example of what a healthy plate looks like:    ? Make half your plate fruits and vegetables.     ? Focus on whole fruits.     ? Vary your veggies.  ? Make half your grains whole grains. -     ? Look for the word "whole" at the beginning of the ingredients list    ? Some whole-grain ingredients include whole oats, whole-wheat flour,        whole-grain corn, whole-grain brown rice, and whole rye.  ? Move to low-fat and fat-free milk or yogurt.  ? Vary your protein routine. - Meat, fish, poultry (chicken, [place]), eggs, beans (kidney, pinto), dairy.  ? Drink and eat less sodium, saturated fat, and added sugars.

## 2021-06-10 NOTE — Assessment & Plan Note (Signed)
Generated referral to pain clinic. Gave 1 month of oxycontin to last until appt. Informed patient we would not prescribe narcotics for chronic pain, she verbalized understanding.

## 2021-06-10 NOTE — Telephone Encounter (Signed)
Pt called in because she was seen by provider Rumball today and prescribed OXYCONTIN 10 MG 12 hr tablet . Pt says that she was told by her pharmacy that Rx requires providers approval.    Pt would like further assistance.

## 2021-06-10 NOTE — Assessment & Plan Note (Signed)
Contributing to poor diabetic control, HTN, HLD. Recommend weight loss through diet and exercise. Handout provided.

## 2021-06-11 ENCOUNTER — Ambulatory Visit: Payer: Medicaid Other | Admitting: Podiatry

## 2021-06-11 NOTE — Telephone Encounter (Signed)
Called pharmacy for vertification  Working on [place]

## 2021-06-11 NOTE — Telephone Encounter (Signed)
Patient notified that PA will be taken care of on 7/25 when Dr.Rumball come back in to office. Patient verbalized and understood

## 2021-06-11 NOTE — Telephone Encounter (Signed)
Pt returned call, checking status. Just left pharmacy they told her they are still waiting for authorization.

## 2021-06-15 ENCOUNTER — Other Ambulatory Visit: Payer: Self-pay | Admitting: Family Medicine

## 2021-06-15 NOTE — Telephone Encounter (Signed)
We dont prescribe but it states I dont have the security to refuse

## 2021-06-15 NOTE — Telephone Encounter (Signed)
Medication Refill - Medication: oxyCODONE-acetaminophen (PERCOCET) 5-325 MG tablet   Pt is completely out of her current supply   Has the patient contacted their pharmacy? Yes.   (Agent: If no, request that the patient contact the pharmacy for the refill.) (Agent: If yes, when and what did the pharmacy advise?)  Preferred Pharmacy (with phone number or street name):  [facility] [id number], [place] - [address]  [place] [address]  Phone: [phone] Fax: [phone]    Agent: Please be advised that RX refills may take up to 3 business days. We ask that you follow-up with your pharmacy.

## 2021-06-15 NOTE — Telephone Encounter (Signed)
Last seen 7.20.2022 upcoming 10.21.2022

## 2021-06-15 NOTE — Telephone Encounter (Signed)
  Notes to clinic: medication filled by a different provider  Review for refill    Requested Prescriptions  Pending Prescriptions Disp Refills   oxyCODONE-acetaminophen (PERCOCET) 5-325 MG tablet 12 tablet 0    Sig: Take 1 tablet by mouth every 6 (six) hours as needed for severe pain.      Not Delegated - Analgesics:  Opioid Agonist Combinations Failed - 06/15/2021  2:23 PM      Failed - This refill cannot be delegated      Passed - Urine Drug Screen completed in last 360 days      Passed - Valid encounter within last 6 months    Recent Outpatient Visits           5 days ago Type 2 diabetes mellitus with hyperglycemia, with long-term current use of insulin [facility])   [place], DO   4 months ago Screening for colon cancer   [place], DO   5 months ago Type 2 diabetes mellitus with hyperglycemia, with long-term current use of insulin [facility])   [facility] Myles Gip, DO   1 year ago Essential hypertension   [facility] Towanda Malkin, MD   1 year ago [place], [place]       Future Appointments             In 2 months Delsa Grana, PA-C [facility], [facility]

## 2021-08-10 ENCOUNTER — Other Ambulatory Visit (HOSPITAL_COMMUNITY)
Admission: RE | Admit: 2021-08-10 | Discharge: 2021-08-10 | Disposition: A | Discharge status: Home / Self Care | Payer: Medicaid Other | Source: Ambulatory Visit | Attending: Certified Nurse Midwife | Admitting: Certified Nurse Midwife

## 2021-08-10 ENCOUNTER — Other Ambulatory Visit: Payer: Self-pay

## 2021-08-10 ENCOUNTER — Encounter: Payer: Self-pay | Admitting: Certified Nurse Midwife

## 2021-08-10 ENCOUNTER — Ambulatory Visit (INDEPENDENT_AMBULATORY_CARE_PROVIDER_SITE_OTHER): Payer: Medicaid Other | Admitting: Certified Nurse Midwife

## 2021-08-10 VITALS — BP 170/93 | HR 93 | Ht 66.0 in | Wt 318.5 lb

## 2021-08-10 DIAGNOSIS — Z1389 Encounter for screening for other disorder: Secondary | ICD-10-CM

## 2021-08-10 DIAGNOSIS — N898 Other specified noninflammatory disorders of vagina: Secondary | ICD-10-CM | POA: Diagnosis not present

## 2021-08-10 IMAGING — MR MR LUMBAR SPINE W/O CM
4 of 5 series · 33 of 48 positions shown · non-contrast
Comparison: MRI of the lumbar spine October 13, 2019.

CLINICAL DATA: Chronic low back pain

EXAM:
MRI LUMBAR SPINE WITHOUT CONTRAST
TECHNIQUE: Multiplanar, multisequence MR imaging of the lumbar spine was
performed. No intravenous contrast was administered.

[Series 9: T2 · sagittal · 4.0mm · 0.81mm/px · 8 of 15 slices shown (1 of 2)]
[im 1/15]
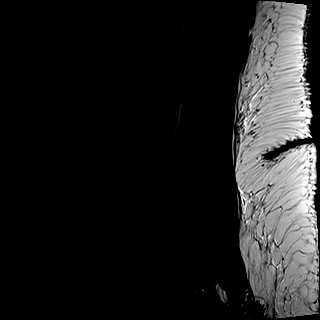
[im 3/15]
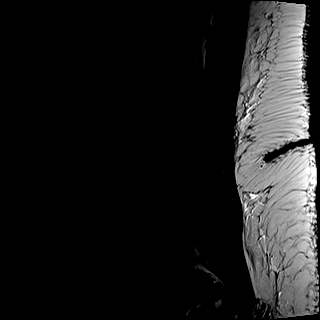
[im 5/15]
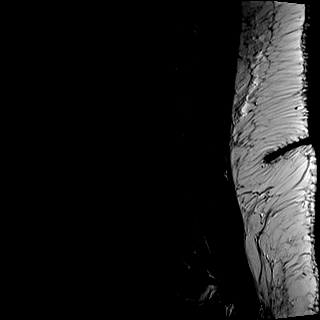
[im 7/15]
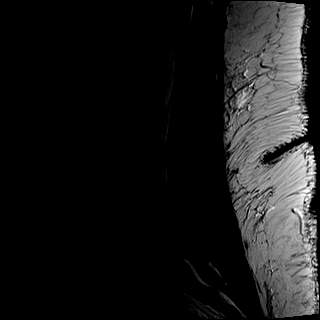
[im 9/15]
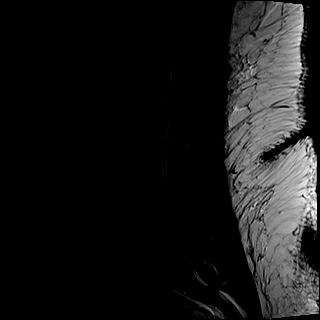
[im 11/15]
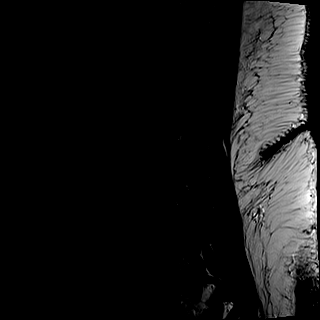
[im 13/15]
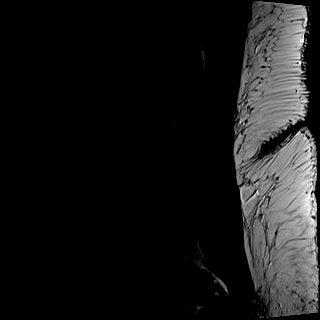
[im 15/15]
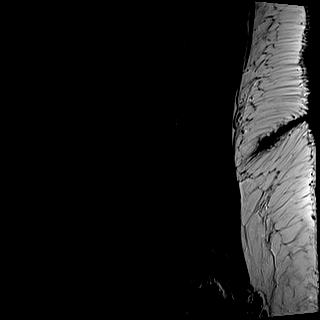

[Series 10: T1 · sagittal · 4.0mm · 0.81mm/px · 7 of 15 slices shown (1 of 2)]
[im 1/15]
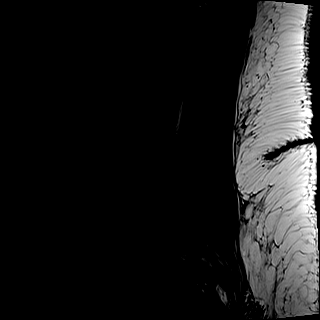
[im 3/15]
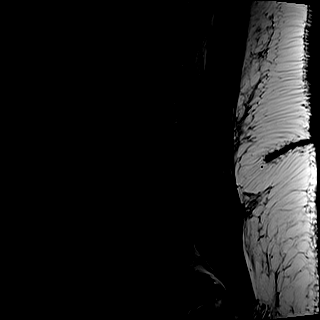
[im 5/15]
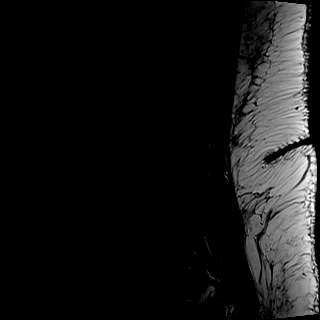
[im 8/15]
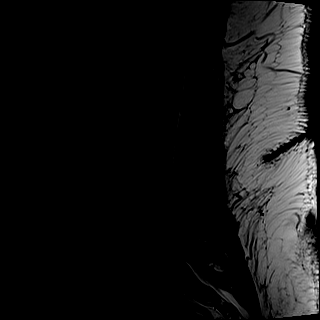
[im 10/15]
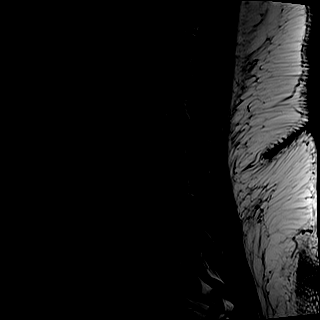
[im 12/15]
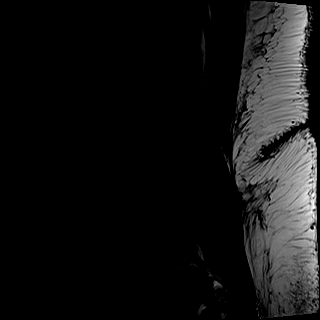
[im 15/15]
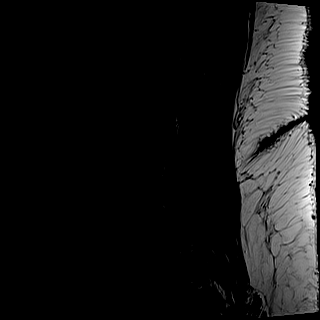

[Series 12: T2 · axial · 4.0mm · 0.78mm/px · z∈[-192,-24]mm · 9 of 28 slices shown (2 of 2)]
[im 1/28]
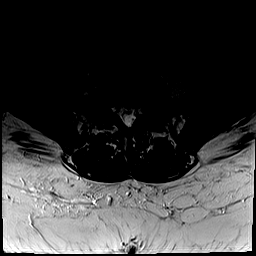
[im 5/28]
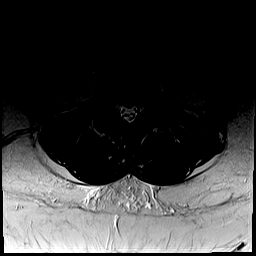
[im 10/28]
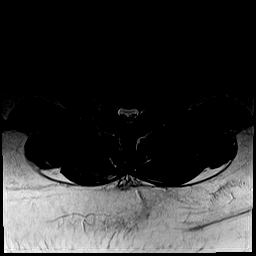
[im 12/28]
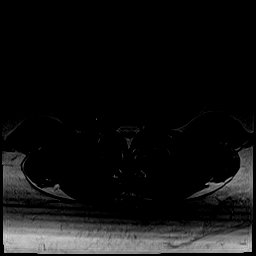
[im 14/28]
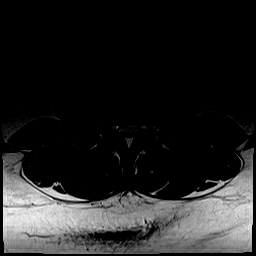
[im 16/28]
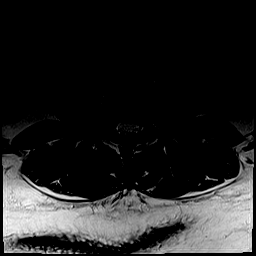
[im 19/28]
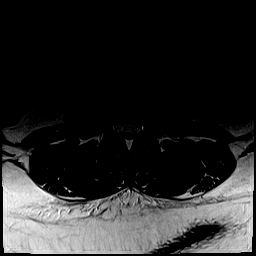
[im 23/28]
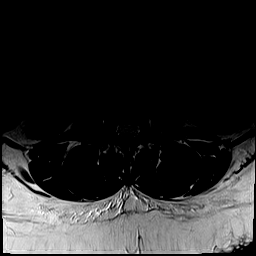
[im 28/28]
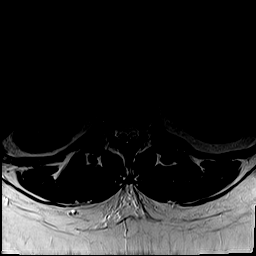

[Series 13: T1 · axial · 4.0mm · 0.39mm/px · z∈[-192,-24]mm · 9 of 28 slices shown (2 of 2)]
[im 1/28]
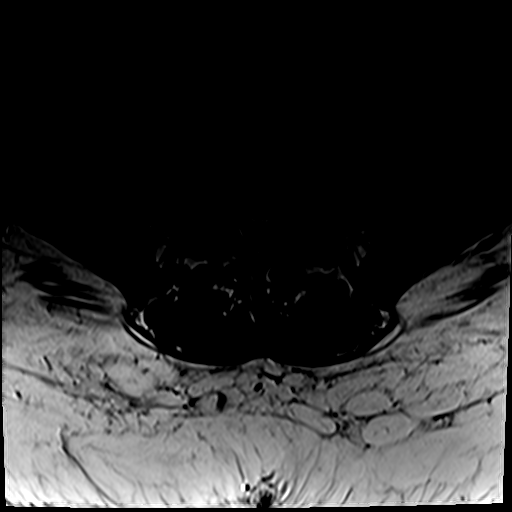
[im 5/28]
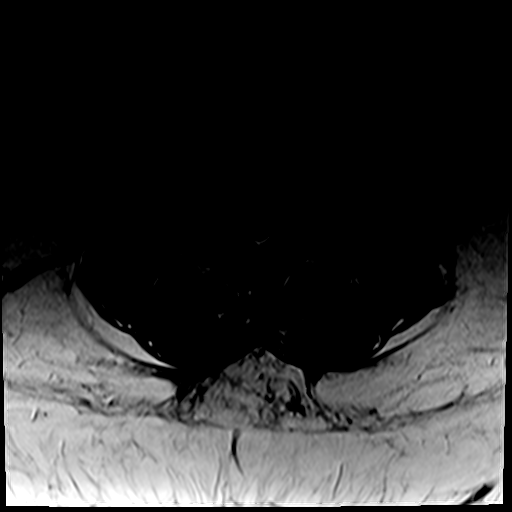
[im 10/28]
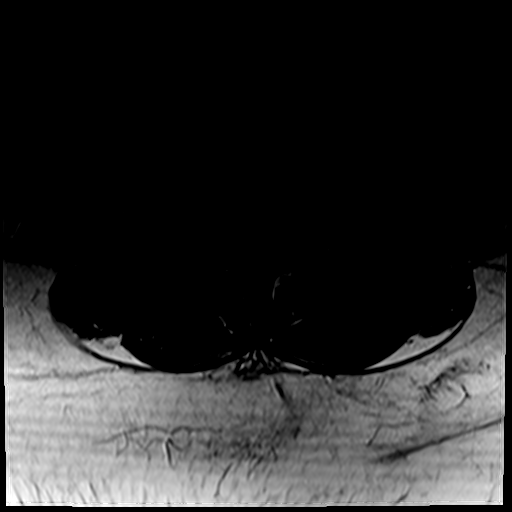
[im 12/28]
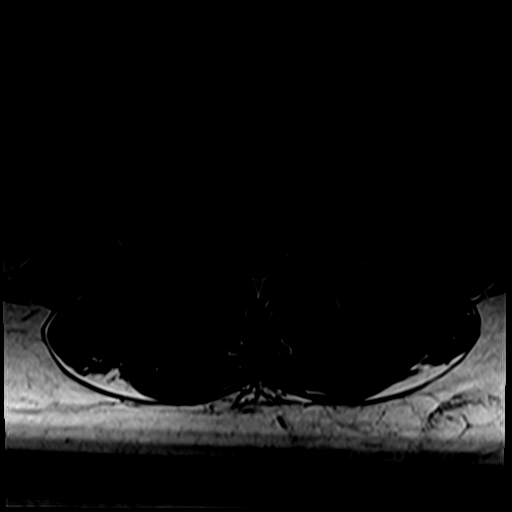
[im 14/28]
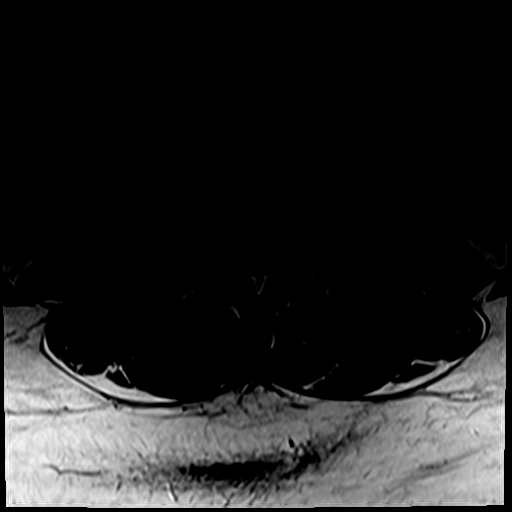
[im 16/28]
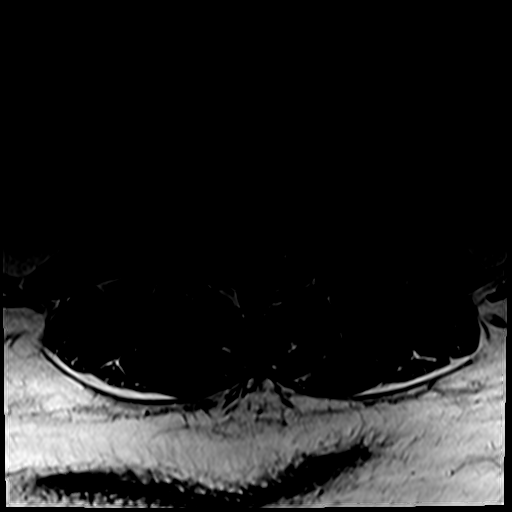
[im 19/28]
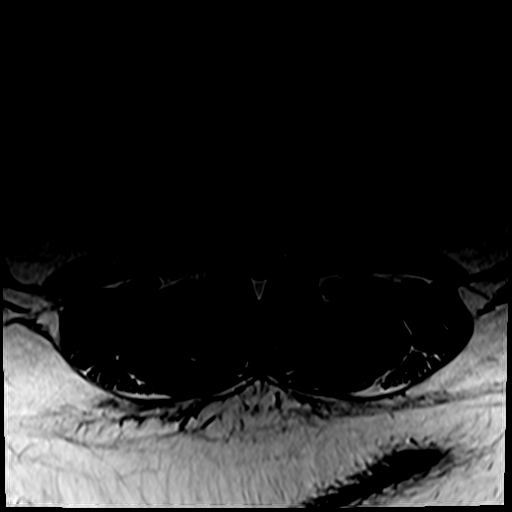
[im 23/28]
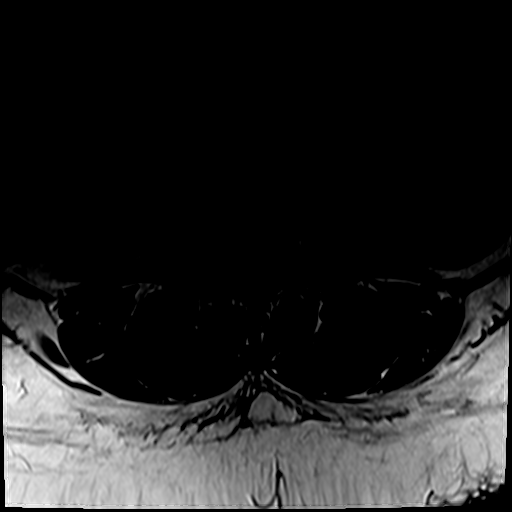
[im 28/28]
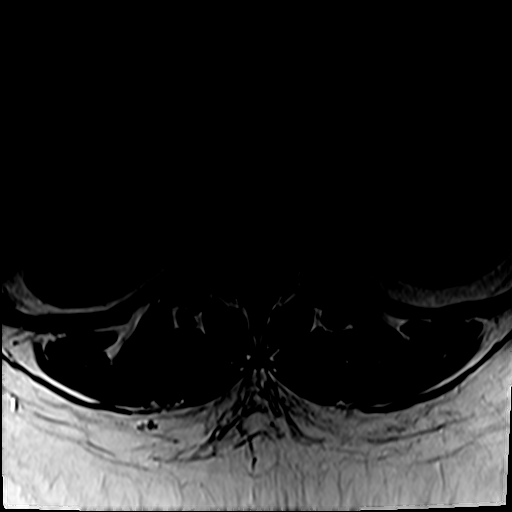

[33 of 48 positions shown; findings below may reference images not displayed]

FINDINGS: Segmentation:  Standard.

Alignment:  Physiologic.

Vertebrae:  No fracture, evidence of discitis, or bone lesion.

Conus medullaris and cauda equina: Conus extends to the L1 level.
Conus and cauda equina appear normal.

Paraspinal and other soft tissues: Negative.

Disc levels:

T12-L1:No spinal canal or neural foraminal stenosis.

L1-2:No spinal canal or neural foraminal stenosis.

L2-3:No spinal canal or neural foraminal stenosis.

L3-4:Shallow disc bulge and mild facet degenerative changes without
significant spinal canal or neural foraminal stenosis

L4-5:Disc bulge, facet degenerative changes with bilateral joint
effusion and ligamentum flavum redundancy resulting in
mild-to-moderate spinal canal stenosis with narrowing of the
bilateral subarticular zones and mild bilateral neural foraminal
narrowing. Findings are not significantly changed from prior MRI.

L5-S1:Shallow disc bulge and mild facet degenerative changes
resulting in mild narrowing the bilateral subarticular zones. No
significant neural foraminal narrowing. No significant change from
prior MRI.
IMPRESSION: 1. No acute abnormality or significant interval change from prior
MRI.
2. Mild-to-moderate spinal canal stenosis with narrowing of the
bilateral subarticular zones and mild bilateral neural foraminal
narrowing at L4-L5.
3. Mild narrowing of the bilateral subarticular zones at L5-S1.

## 2021-08-10 MED ORDER — TRANEXAMIC ACID 650 MG PO TABS: 1300.0000 mg | ORAL_TABLET | Freq: Three times a day (TID) | ORAL | 0 refills | Status: AC

## 2021-08-10 NOTE — Patient Instructions (Signed)

## 2021-08-10 NOTE — Progress Notes (Signed)
GYN ENCOUNTER NOTE  Subjective:       Brenda Hicks is a 55 y.o. [phone] female is here for gynecologic evaluation of the following issues:  1. Vaginal bleeding, vaginal odor, blood in urine, pain with intercourse.  Pt has had endometrial ablation for abnormal uterine bleeding 2/22 of this year.    Gynecologic History No LMP recorded (lmp unknown). Patient has had an ablation. Contraception: tubal ligation Last Pap: 01/29/2019. Results were: normal Last mammogram: unknown. Results were: ordered  Obstetric History OB History  Gravida Para Term Preterm AB Living  '7 6 5 1 1 6  ' SAB IAB Ectopic Multiple Live Births      1   6    # Outcome Date GA Lbr Len/2nd Weight Sex Delivery Anes PTL Lv  7 Ectopic 1991          6 Term 11/11/89   8 lb 4 oz (3.742 kg) M Vag-Spont  N LIV  5 Term 03/10/88   7 lb 2 oz (3.232 kg) M Vag-Spont  N LIV  4 Term 02/15/87   6 lb (2.722 kg) M Vag-Spont  N LIV  3 Term 01/02/86   5 lb 10 oz (2.551 kg)  Vag-Spont  N LIV     Complications: Dysfunctional Labor  2 Preterm 12/31/84   4 lb 11 oz (2.126 kg) M Vag-Spont  Y LIV  1 Term 03/05/84   6 lb 7 oz (2.92 kg) M Vag-Spont  N LIV    Past Medical History:  Diagnosis Date   Anemia    vitamin d deficiency   Arthritis    Asthma    WELL CONTROLLED   Cancer of ear    skin cancer left ear   COPD (chronic obstructive pulmonary disease) ([place])    Diabetes mellitus without complication ([place])    Fatty liver    Hypertension    Kidney cysts    per patient, never had   Renal disorder    Sleep apnea    DOES NOT USE CPAP. waiting for new machine and a new sleep study   Stroke [facility]) May or June 2019   TIA. no residual symptoms    Past Surgical History:  Procedure Laterality Date   ANTERIOR CERVICAL DECOMP/DISCECTOMY FUSION N/A 08/09/2018   Procedure: ANTERIOR CERVICAL DECOMPRESSION/DISCECTOMY FUSION 1 LEVEL- C4-5;  Surgeon: Meade Maw, MD;  Location: [facility] ORS;  Service: Neurosurgery;  Laterality: N/A;   BACK  SURGERY     NECK   COLONOSCOPY WITH PROPOFOL N/A 02/26/2021   Procedure: COLONOSCOPY WITH PROPOFOL;  Surgeon: Jonathon Bellows, MD;  Location: [facility] ENDOSCOPY;  Service: Endoscopy;  Laterality: N/A;   DILATATION & CURETTAGE/HYSTEROSCOPY WITH MYOSURE N/A 01/19/2021   Procedure: DILATATION & CURETTAGE/HYSTEROSCOPY;  Surgeon: Rubie Maid, MD;  Location: [facility] ORS;  Service: Gynecology;  Laterality: N/A;   DILATION AND CURETTAGE OF UTERUS     ENDOMETRIAL ABLATION N/A 01/19/2021   Procedure: ENDOMETRIAL ABLATION, MINERVA;  Surgeon: Rubie Maid, MD;  Location: [facility] ORS;  Service: Gynecology;  Laterality: N/A;   ENDOMETRIAL BIOPSY     benign   EXPLORATORY LAPAROTOMY  1992   REMOVAL OF RUPTURED ECTOPIC   HAND SURGERY Right 1998   cyst removed   HERNIA REPAIR  6270   UMBILICAL   JOINT REPLACEMENT Right 2014   TKR   KNEE ARTHROSCOPY Right 2012   KNEE SURGERY Right 2014   total knee replacement   SHOULDER ARTHROSCOPY WITH BICEPSTENOTOMY Left 12/14/2016   Procedure: SHOULDER ARTHROSCOPY WITH BICEPSTENOTOMY;  Surgeon: Corky Mull, MD;  Location: [facility];  Service: Orthopedics;  Laterality: Left;   SHOULDER ARTHROSCOPY WITH OPEN ROTATOR CUFF REPAIR Left 12/14/2016   Procedure: SHOULDER ARTHROSCOPY WITH OPEN ROTATOR CUFF REPAIR AND ARTHROSCOPIC ROTATOR CUFF REPAIR;  Surgeon: Corky Mull, MD;  Location: [facility] OR[facility];  Service: Orthopedics;  Laterality: Left;   SHOULDER ARTHROSCOPY WITH ROTATOR CUFF REPAIR Right 01/04/2019   Procedure: SHOULDER ARTHROSCOPY WITH ROTATOR CUFF REPAIR;  Surgeon: Corky Mull, MD;  Location: [facility];  Service: Orthopedics;  Laterality: Right;   SHOULDER ARTHROSCOPY WITH SUBACROMIAL DECOMPRESSION Left 12/14/2016   Procedure: SHOULDER ARTHROSCOPY WITH SUBACROMIAL DECOMPRESSION;  Surgeon: Corky Mull, MD;  Location: [facility];  Service: Orthopedics;  Laterality: Left;   SHOULDER ARTHROSCOPY WITH SUBACROMIAL DECOMPRESSION AND BICEP TENDON REPAIR Right 01/04/2019   Procedure: SHOULDER  ARTHROSCOPY WITH DEBRIDEMENT AND SUBACROMIAL DECOMPRESSION-RIGHT;  Surgeon: Corky Mull, MD;  Location: [facility] OR[facility];  Service: Orthopedics;  Laterality: Right;   TUBAL LIGATION      Current Outpatient Medications on File Prior to Visit  Medication Sig Dispense Refill   ACCU-CHEK GUIDE test strip USE TO CHECK BLOOD SUGAR UP TO FOUR TIMES DAILY AS DIRECTED 100 each 3   albuterol (VENTOLIN HFA) 108 (90 Base) MCG/ACT inhaler Inhale 2 puffs into the lungs every 6 (six) hours as needed for wheezing or shortness of breath. 18 g 3   atorvastatin (LIPITOR) 40 MG tablet Take 1 tablet (40 mg total) by mouth daily. 90 tablet 3   blood glucose meter kit and supplies Dispense based on patient and insurance preference. Use up to four times daily as directed. (FOR ICD-10 E10.9, E11.9). 1 each 3   cyclobenzaprine (FLEXERIL) 10 MG tablet Take 1 tablet (10 mg total) by mouth 3 (three) times daily as needed. 30 tablet 1   Dulaglutide (TRULICITY) 5.78 IO/9.6EX SOPN Inject 0.75 mg into the skin once a week. 2 mL 3   ibuprofen (ADVIL) 800 MG tablet Take 1 tablet (800 mg total) by mouth every 8 (eight) hours as needed for mild pain or cramping. 30 tablet 1   insulin glargine (LANTUS) 100 UNIT/ML injection Inject 100 Units into the skin daily.     insulin lispro (HUMALOG) 100 UNIT/ML injection Inject 30 Units into the skin 3 (three) times daily before meals.     Insulin Syringe-Needle U-100 (INSULIN SYRINGE 1CC/31GX5/16") 31G X 5/16" 1 ML MISC Use 4 times daily as directed. 100 each 3   ketorolac (TORADOL) 10 MG tablet Take 1 tablet (10 mg total) by mouth every 6 (six) hours as needed. 20 tablet 0   Lancets (ACCU-CHEK MULTICLIX) lancets Use as instructed 100 each 12   lisinopril-hydrochlorothiazide (ZESTORETIC) 20-12.5 MG tablet Take 2 tablets by mouth at bedtime. 180 tablet 3   ondansetron (ZOFRAN) 4 MG tablet Take 1 tablet by mouth as needed.     orphenadrine (NORFLEX) 100 MG tablet Take 1 tablet (100 mg total) by mouth  2 (two) times daily. 10 tablet 0   oxyCODONE-acetaminophen (PERCOCET) 5-325 MG tablet Take 1 tablet by mouth every 6 (six) hours as needed for severe pain. 12 tablet 0   gabapentin (NEURONTIN) 300 MG capsule Take 1 capsule (300 mg total) by mouth 3 (three) times daily. 90 capsule 1   No current facility-administered medications on file prior to visit.    Allergies  Allergen Reactions   Dilaudid [Hydromorphone Hcl] Nausea And Vomiting   Morphine And Related Nausea And Vomiting   Tramadol Nausea Only  If she takes antinausea medicine with this medicine, then she can tolerate it.    Social History   Socioeconomic History   Marital status: Married    Spouse name: brian   Number of children: 7   Years of education: Not on file   Highest education level: Not on file  Occupational History   Occupation: disabled    Comment: disabled  Tobacco Use   Smoking status: Former    Packs/day: 2.00    Years: 25.00    Pack years: 50.00    Types: Cigarettes    Quit date: 06/09/2017    Years since quitting: 4.1   Smokeless tobacco: Never  Vaping Use   Vaping Use: Never used  Substance and Sexual Activity   Alcohol use: No   Drug use: Yes    Frequency: 7.0 times per week    Types: Marijuana    Comment: Pt. uses marjiuana only when she has severe back and side pain   Sexual activity: Yes    Partners: Male    Birth control/protection: None  Other Topics Concern   Not on file  Social History Narrative   Have 6 biological childrean and custody of niece since 34 months.   Social Determinants of Health   Financial Resource Strain: Not on file  Food Insecurity: Not on file  Transportation Needs: Not on file  Physical Activity: Not on file  Stress: Not on file  Social Connections: Not on file  Intimate Partner Violence: Not on file    Family History  Problem Relation Age of Onset   Hypertension Mother    Thyroid disease Mother    Lupus Mother    Congestive Heart Failure Mother     CAD Maternal Grandmother    Breast cancer Neg Hx    Ovarian cancer Neg Hx    Colon cancer Neg Hx     The following portions of the patient's history were reviewed and updated as appropriate: allergies, current medications, past family history, past medical history, past social history, past surgical history and problem list.  Review of Systems Review of Systems - Negative except as mentioned in HPI Review of Systems - General ROS: negative for - chills, fatigue, fever, hot flashes, malaise or night sweats Hematological and Lymphatic ROS: negative for - bleeding problems or swollen lymph nodes Gastrointestinal ROS: negative for - abdominal pain, blood in stools, change in bowel habits and nausea/vomiting Musculoskeletal ROS: negative for - joint pain, muscle pain or muscular weakness Genito-Urinary ROS: negative for -dysmenorrhea,, dysuria, genital discharge, genital ulcers, hematuria, incontinence, irregular/heavy menses, nocturia or pelvic pain. Positive for  change in menstrual cycle,  dyspareunia, vaginal odor, blood in urine  Objective:   BP (!) 170/93   Pulse 93   Ht '5\' 6"'  (1.676 m)   Wt (!) 318 lb 8 oz (144.5 kg)   LMP  (LMP Unknown)   BMI 51.41 kg/m  CONSTITUTIONAL: Well-developed, well-nourished, morbid obese female in no acute distress.  HENT:  Normocephalic, atraumatic.  NECK: Normal range of motion, supple, no masses.  Normal thyroid.  SKIN: Skin is warm and dry. No rash noted. Not diaphoretic. No erythema. No pallor. [place]: Alert and oriented to person, place, and time. PSYCHIATRIC: Normal mood and affect. Normal behavior. Normal judgment and thought content. CARDIOVASCULAR:Not Examined RESPIRATORY: Not Examined BREASTS: Not Examined ABDOMEN: Soft, non distended; Non tender.  No Organomegaly. PELVIC:  External Genitalia: Normal  BUS: Normal  Vagina: Normal  Cervix: Normal,odor noted, old blood in vaginal ,  swab collected, no polyps seen.    MUSCULOSKELETAL:  Normal range of motion. No tenderness.  No cyanosis, clubbing, or edema.     Assessment:   Vaginal odor Abnormal uterine bleeding Dyspareunia     Plan:   Swab collected . Urine culture sent. Discussed use of lyseda to manage bleeding, orders placed. Discussed use of lubrication during intercourse and vaginal moisturizers .Discussed use of medication due to vulvovaginal atrophie. Orders placed for premarin cream. Follow up prn.   Philip Aspen, CNM

## 2021-08-11 LAB — CERVICOVAGINAL ANCILLARY ONLY
Bacterial Vaginitis (gardnerella): NEGATIVE
Candida Glabrata: NEGATIVE
Candida Vaginitis: NEGATIVE
Comment: NEGATIVE
Comment: NEGATIVE
Comment: NEGATIVE

## 2021-08-11 MED ORDER — PREMARIN 0.625 MG/GM VA CREA
1.0000 | TOPICAL_CREAM | Freq: Every day | VAGINAL | 12 refills | Status: DC
Start: 2021-08-11 — End: 2022-01-20

## 2021-08-12 ENCOUNTER — Emergency Department
Admission: EM | Admit: 2021-08-12 | Discharge: 2021-08-12 | Disposition: A | Discharge status: Home / Self Care | Payer: Medicaid Other | Attending: Emergency Medicine | Admitting: Emergency Medicine

## 2021-08-12 ENCOUNTER — Emergency Department: Payer: Medicaid Other

## 2021-08-12 ENCOUNTER — Other Ambulatory Visit: Payer: Self-pay

## 2021-08-12 ENCOUNTER — Encounter: Payer: Self-pay | Admitting: Emergency Medicine

## 2021-08-12 DIAGNOSIS — M7989 Other specified soft tissue disorders: Secondary | ICD-10-CM

## 2021-08-12 DIAGNOSIS — Z794 Long term (current) use of insulin: Secondary | ICD-10-CM | POA: Insufficient documentation

## 2021-08-12 DIAGNOSIS — J449 Chronic obstructive pulmonary disease, unspecified: Secondary | ICD-10-CM | POA: Insufficient documentation

## 2021-08-12 DIAGNOSIS — M25522 Pain in left elbow: Secondary | ICD-10-CM | POA: Diagnosis not present

## 2021-08-12 DIAGNOSIS — Z79899 Other long term (current) drug therapy: Secondary | ICD-10-CM | POA: Insufficient documentation

## 2021-08-12 DIAGNOSIS — Z85828 Personal history of other malignant neoplasm of skin: Secondary | ICD-10-CM | POA: Diagnosis not present

## 2021-08-12 DIAGNOSIS — M25562 Pain in left knee: Secondary | ICD-10-CM | POA: Insufficient documentation

## 2021-08-12 DIAGNOSIS — Z87891 Personal history of nicotine dependence: Secondary | ICD-10-CM | POA: Diagnosis not present

## 2021-08-12 DIAGNOSIS — Z96651 Presence of right artificial knee joint: Secondary | ICD-10-CM | POA: Diagnosis not present

## 2021-08-12 DIAGNOSIS — E1142 Type 2 diabetes mellitus with diabetic polyneuropathy: Secondary | ICD-10-CM | POA: Diagnosis not present

## 2021-08-12 DIAGNOSIS — J45909 Unspecified asthma, uncomplicated: Secondary | ICD-10-CM | POA: Insufficient documentation

## 2021-08-12 DIAGNOSIS — M79605 Pain in left leg: Secondary | ICD-10-CM | POA: Diagnosis present

## 2021-08-12 DIAGNOSIS — I10 Essential (primary) hypertension: Secondary | ICD-10-CM | POA: Diagnosis not present

## 2021-08-12 DIAGNOSIS — M79662 Pain in left lower leg: Secondary | ICD-10-CM

## 2021-08-12 DIAGNOSIS — Z7984 Long term (current) use of oral hypoglycemic drugs: Secondary | ICD-10-CM | POA: Diagnosis not present
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
## 2021-08-27 IMAGING — CR DG CERVICAL SPINE COMPLETE 4+V
7 series · 7 of 7 positions shown · non-contrast
Comparison: Cervical spine radiograph dated 07/31/2018.

CLINICAL DATA: 54-year-old female with neck pain.

EXAM:
CERVICAL SPINE - COMPLETE 4+ VIEW

[c-spine lat]
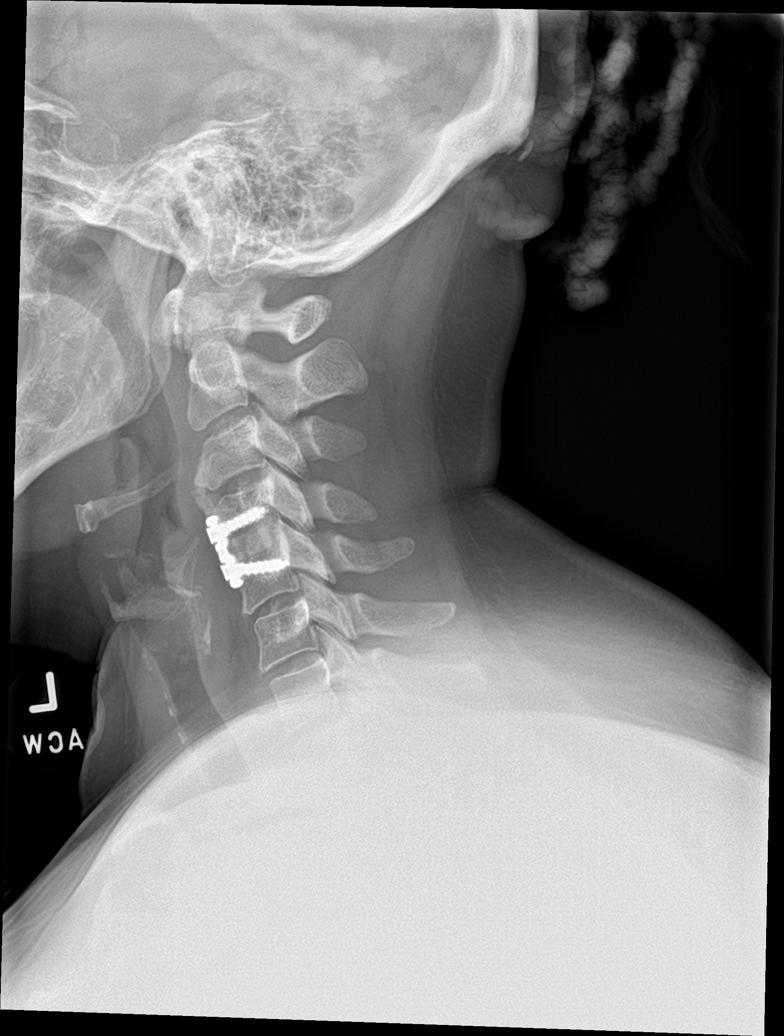

[c-spine ap]
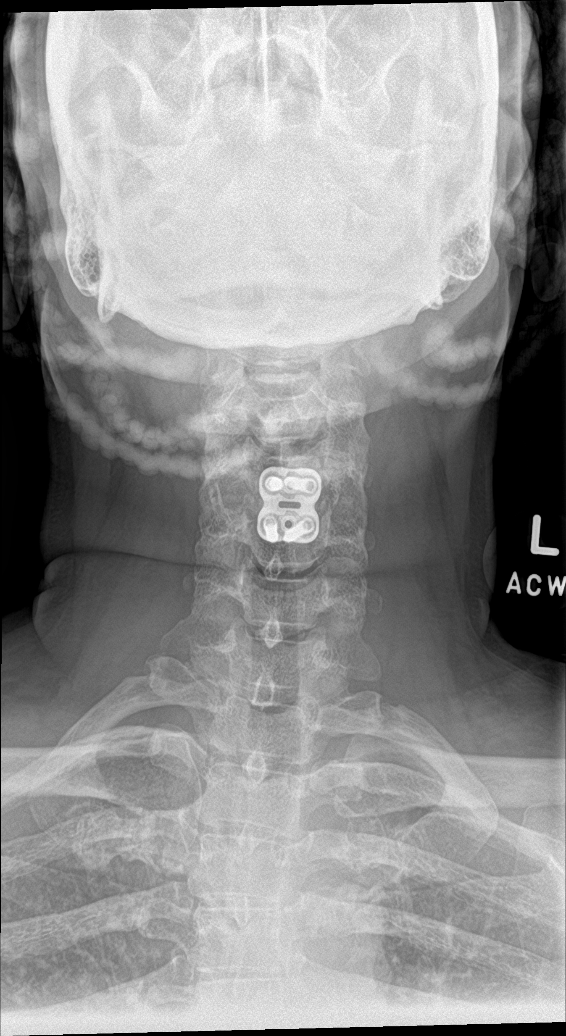

[c-spine open mouth]
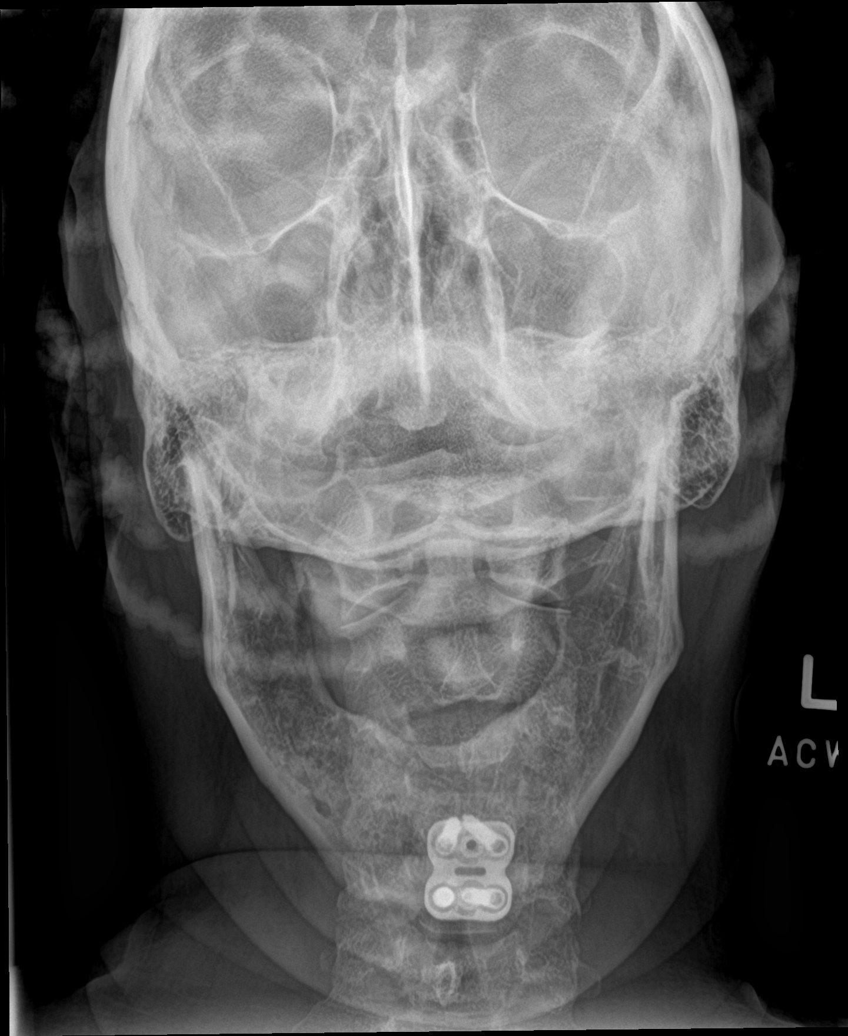

[[person_name]]
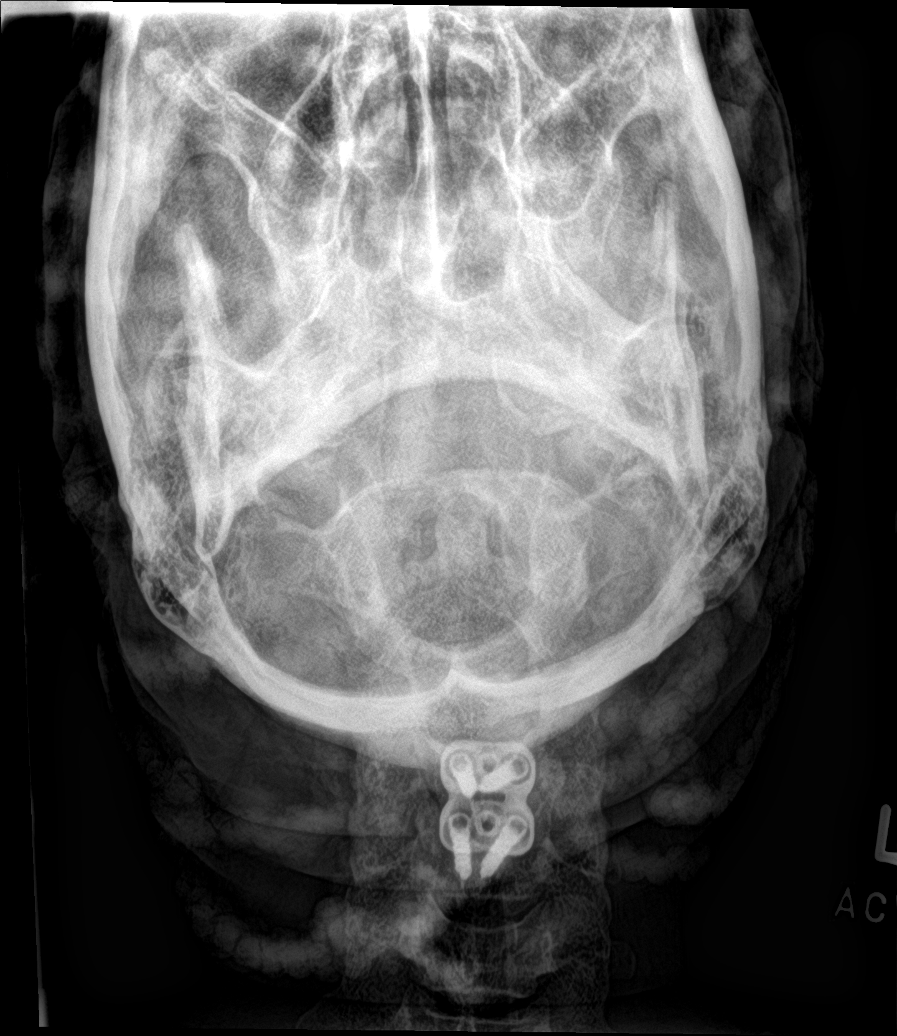

[c-spine swimmers]
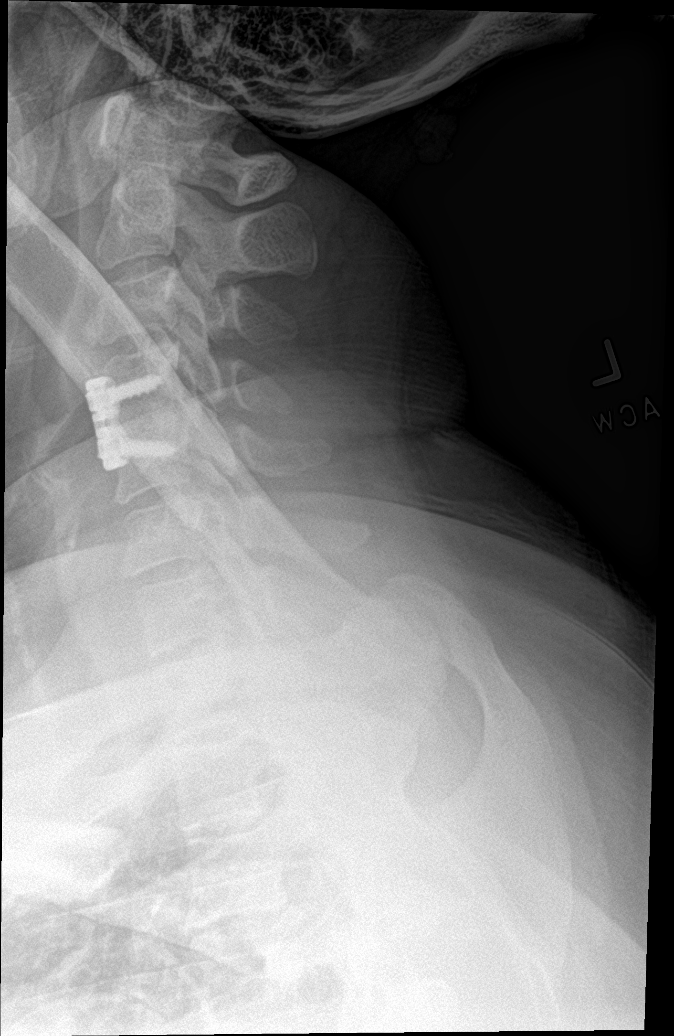

[c-spine obl (1 of 2)]
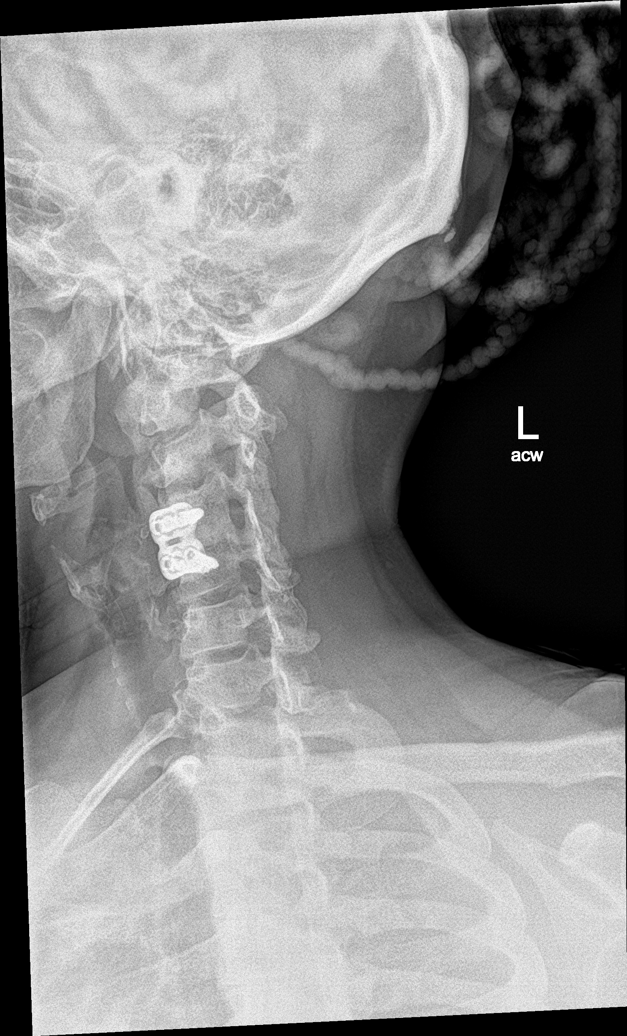

[c-spine obl (2 of 2)]
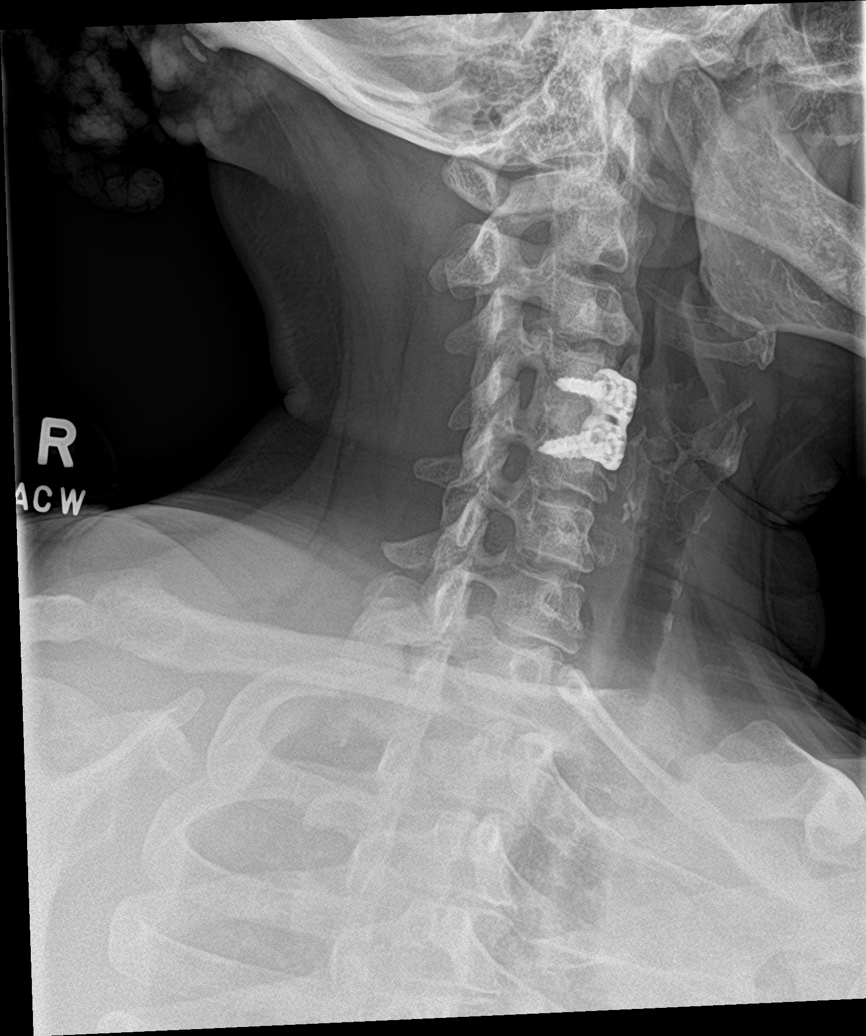

[7 of 7 positions shown; findings below may reference images not displayed]

FINDINGS: There is no acute fracture or subluxation of the cervical spine.
There is straightening of normal cervical lordosis which may be
positional or due to muscle spasm. C4-C5 disc spacer and ACDF. The
visualized posterior elements and odontoid are intact. Mild
bilateral C4-C5 neural foramina narrowing. The soft tissues are
unremarkable.
IMPRESSION: 1. No acute/traumatic cervical spine pathology.
2. C4-C5 ACDF.
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
## 2021-09-11 ENCOUNTER — Other Ambulatory Visit: Payer: Self-pay

## 2021-09-11 ENCOUNTER — Encounter: Payer: Self-pay | Admitting: Internal Medicine

## 2021-09-11 ENCOUNTER — Ambulatory Visit (INDEPENDENT_AMBULATORY_CARE_PROVIDER_SITE_OTHER): Payer: Medicaid Other | Admitting: Internal Medicine

## 2021-09-11 VITALS — BP 142/86 | HR 89 | Temp 98.2°F | Resp 18 | Ht 66.0 in | Wt 319.0 lb

## 2021-09-11 DIAGNOSIS — I10 Essential (primary) hypertension: Secondary | ICD-10-CM | POA: Diagnosis not present

## 2021-09-11 DIAGNOSIS — G4701 Insomnia due to medical condition: Secondary | ICD-10-CM

## 2021-09-11 DIAGNOSIS — E1165 Type 2 diabetes mellitus with hyperglycemia: Secondary | ICD-10-CM | POA: Diagnosis not present

## 2021-09-11 DIAGNOSIS — E1169 Type 2 diabetes mellitus with other specified complication: Secondary | ICD-10-CM

## 2021-09-11 DIAGNOSIS — Z794 Long term (current) use of insulin: Secondary | ICD-10-CM

## 2021-09-11 DIAGNOSIS — E785 Hyperlipidemia, unspecified: Secondary | ICD-10-CM

## 2021-09-11 DIAGNOSIS — G4733 Obstructive sleep apnea (adult) (pediatric): Secondary | ICD-10-CM

## 2021-09-11 LAB — POCT GLYCOSYLATED HEMOGLOBIN (HGB A1C): Hemoglobin A1C: 8.8 % — AB (ref 4.0–5.6)

## 2021-09-11 MED ORDER — TRULICITY 0.75 MG/0.5ML ~~LOC~~ SOAJ: 0.7500 mg | SUBCUTANEOUS | 3 refills | Status: DC

## 2021-09-11 MED ORDER — ATORVASTATIN CALCIUM 40 MG PO TABS: 40.0000 mg | ORAL_TABLET | Freq: Every day | ORAL | 3 refills | Status: DC

## 2021-09-11 NOTE — Patient Instructions (Addendum)
It was great seeing you today!  Plan discussed at today's visit: -C8Y today 8.8 -Trulicity and statin for c cholesterol sent to pharmacy, take Trulicity once a week and statin everyday, if you have any complications please let us know -Continue the great work on diet, continue insulin -Call for closer eye appointment -Keep sin hydrated with [employer] for diabetics  -We will refer to sleep clinic for sleep apnea test   Follow up in: 3 months  Take care and let us know if you have any questions or concerns prior to your next visit.  Dr. Rosana Berger

## 2021-09-17 ENCOUNTER — Emergency Department
Admission: EM | Admit: 2021-09-17 | Discharge: 2021-09-17 | Disposition: A | Discharge status: Home / Self Care | Payer: Medicaid Other | Attending: Emergency Medicine | Admitting: Emergency Medicine

## 2021-09-17 ENCOUNTER — Emergency Department: Payer: Medicaid Other

## 2021-09-17 ENCOUNTER — Encounter: Payer: Self-pay | Admitting: Emergency Medicine

## 2021-09-17 ENCOUNTER — Other Ambulatory Visit: Payer: Self-pay

## 2021-09-17 DIAGNOSIS — W19XXXA Unspecified fall, initial encounter: Secondary | ICD-10-CM | POA: Diagnosis not present

## 2021-09-17 DIAGNOSIS — J45909 Unspecified asthma, uncomplicated: Secondary | ICD-10-CM | POA: Diagnosis not present

## 2021-09-17 DIAGNOSIS — S8392XA Sprain of unspecified site of left knee, initial encounter: Secondary | ICD-10-CM | POA: Diagnosis not present

## 2021-09-17 DIAGNOSIS — Z96651 Presence of right artificial knee joint: Secondary | ICD-10-CM | POA: Insufficient documentation

## 2021-09-17 DIAGNOSIS — J449 Chronic obstructive pulmonary disease, unspecified: Secondary | ICD-10-CM | POA: Diagnosis not present

## 2021-09-17 DIAGNOSIS — M25532 Pain in left wrist: Secondary | ICD-10-CM | POA: Diagnosis not present

## 2021-09-17 DIAGNOSIS — Z79899 Other long term (current) drug therapy: Secondary | ICD-10-CM | POA: Insufficient documentation

## 2021-09-17 DIAGNOSIS — I10 Essential (primary) hypertension: Secondary | ICD-10-CM | POA: Insufficient documentation

## 2021-09-17 DIAGNOSIS — Z794 Long term (current) use of insulin: Secondary | ICD-10-CM | POA: Diagnosis not present

## 2021-09-17 DIAGNOSIS — Z87891 Personal history of nicotine dependence: Secondary | ICD-10-CM | POA: Diagnosis not present

## 2021-09-17 DIAGNOSIS — Z85828 Personal history of other malignant neoplasm of skin: Secondary | ICD-10-CM | POA: Diagnosis not present

## 2021-09-17 DIAGNOSIS — E119 Type 2 diabetes mellitus without complications: Secondary | ICD-10-CM | POA: Insufficient documentation

## 2021-09-17 DIAGNOSIS — S8992XA Unspecified injury of left lower leg, initial encounter: Secondary | ICD-10-CM | POA: Diagnosis present

## 2021-09-17 MED ORDER — OXYCODONE-ACETAMINOPHEN 5-325 MG PO TABS: 1.0000 | ORAL_TABLET | ORAL | 0 refills | Status: DC | PRN

## 2021-09-17 MED ORDER — OXYCODONE-ACETAMINOPHEN 5-325 MG PO TABS
1.0000 | ORAL_TABLET | Freq: Once | ORAL | Status: AC
  Administered 2021-09-17: 1 via ORAL
  Filled 2021-09-17: qty 1

## 2021-09-17 MED ORDER — ONDANSETRON 4 MG PO TBDP
4.0000 mg | ORAL_TABLET | Freq: Once | ORAL | Status: AC
  Administered 2021-09-17: 4 mg via ORAL
  Filled 2021-09-17: qty 1
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
## 2021-09-17 NOTE — Discharge Instructions (Addendum)
Follow up with your regular doctor if not better in 3 days Call [facility] to see if you can transfer your care from [facility] to here for PT

## 2021-09-17 NOTE — ED Triage Notes (Signed)
Pt reports her left knee gave out on her and popped and she fell. Pt reports she used her left arm to try and catch herself and now her entire left side hurts, left knee, arm, hip. Denies LOC

## 2021-09-23 ENCOUNTER — Ambulatory Visit: Payer: Medicaid Other | Admitting: Family Medicine

## 2021-09-23 ENCOUNTER — Other Ambulatory Visit: Payer: Self-pay

## 2021-09-23 ENCOUNTER — Encounter: Payer: Self-pay | Admitting: Family Medicine

## 2021-09-23 VITALS — BP 148/92 | HR 107 | Temp 99.6°F | Resp 16 | Ht 66.0 in | Wt 314.2 lb

## 2021-09-23 DIAGNOSIS — G959 Disease of spinal cord, unspecified: Secondary | ICD-10-CM

## 2021-09-23 DIAGNOSIS — M1712 Unilateral primary osteoarthritis, left knee: Secondary | ICD-10-CM | POA: Diagnosis not present

## 2021-09-23 DIAGNOSIS — M47892 Other spondylosis, cervical region: Secondary | ICD-10-CM

## 2021-09-23 NOTE — Patient Instructions (Signed)
It was great to see you!  Our plans for today:  - Call the [facility] to schedule your appointment - [phone]. - Let us know if you don't hear about an appointment with the Pain Clinic or Orthopedics.   Take care and seek immediate care sooner if you develop any concerns.   Dr. Ky Barban
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
## 2021-09-23 NOTE — Assessment & Plan Note (Signed)
Acute on chronic, exacerbated by recent fall. Discussed options, steroid injection did not work long for her in the past. Elects for ortho evaluation for knee replacement, referral placed.

## 2021-09-24 ENCOUNTER — Emergency Department
Admission: EM | Admit: 2021-09-24 | Discharge: 2021-09-24 | Disposition: A | Discharge status: Home / Self Care | Payer: Medicaid Other | Attending: Emergency Medicine | Admitting: Emergency Medicine

## 2021-09-24 ENCOUNTER — Emergency Department: Payer: Medicaid Other

## 2021-09-24 ENCOUNTER — Other Ambulatory Visit: Payer: Self-pay

## 2021-09-24 DIAGNOSIS — Z20822 Contact with and (suspected) exposure to covid-19: Secondary | ICD-10-CM | POA: Diagnosis not present

## 2021-09-24 DIAGNOSIS — J101 Influenza due to other identified influenza virus with other respiratory manifestations: Secondary | ICD-10-CM | POA: Insufficient documentation

## 2021-09-24 DIAGNOSIS — Z79899 Other long term (current) drug therapy: Secondary | ICD-10-CM | POA: Diagnosis not present

## 2021-09-24 DIAGNOSIS — Z85828 Personal history of other malignant neoplasm of skin: Secondary | ICD-10-CM | POA: Insufficient documentation

## 2021-09-24 DIAGNOSIS — Z87891 Personal history of nicotine dependence: Secondary | ICD-10-CM | POA: Insufficient documentation

## 2021-09-24 DIAGNOSIS — R1030 Lower abdominal pain, unspecified: Secondary | ICD-10-CM | POA: Diagnosis not present

## 2021-09-24 DIAGNOSIS — M549 Dorsalgia, unspecified: Secondary | ICD-10-CM | POA: Diagnosis not present

## 2021-09-24 DIAGNOSIS — E1142 Type 2 diabetes mellitus with diabetic polyneuropathy: Secondary | ICD-10-CM | POA: Insufficient documentation

## 2021-09-24 DIAGNOSIS — J449 Chronic obstructive pulmonary disease, unspecified: Secondary | ICD-10-CM | POA: Diagnosis not present

## 2021-09-24 DIAGNOSIS — J45909 Unspecified asthma, uncomplicated: Secondary | ICD-10-CM | POA: Insufficient documentation

## 2021-09-24 DIAGNOSIS — Z794 Long term (current) use of insulin: Secondary | ICD-10-CM | POA: Insufficient documentation

## 2021-09-24 DIAGNOSIS — R55 Syncope and collapse: Secondary | ICD-10-CM | POA: Diagnosis present

## 2021-09-24 DIAGNOSIS — Z96651 Presence of right artificial knee joint: Secondary | ICD-10-CM | POA: Diagnosis not present

## 2021-09-24 DIAGNOSIS — I10 Essential (primary) hypertension: Secondary | ICD-10-CM | POA: Insufficient documentation

## 2021-09-24 LAB — BASIC METABOLIC PANEL
Anion gap: 10 (ref 5–15)
BUN: 10 mg/dL (ref 6–20)
CO2: 23 mmol/L (ref 22–32)
Calcium: 8.6 mg/dL — ABNORMAL LOW (ref 8.9–10.3)
Chloride: 100 mmol/L (ref 98–111)
Creatinine, Ser: 0.76 mg/dL (ref 0.44–1.00)
GFR, Estimated: >60 mL/min (ref >60)
Glucose, Bld: 227 mg/dL — ABNORMAL HIGH (ref 70–99)
Potassium: 4.2 mmol/L (ref 3.5–5.1)
Sodium: 133 mmol/L — ABNORMAL LOW (ref 135–145)

## 2021-09-24 LAB — CBC
HCT: 44.2 % (ref 36.0–46.0)
Hemoglobin: 13.9 g/dL (ref 12.0–15.0)
MCH: 29.5 pg (ref 26.0–34.0)
MCHC: 31.4 g/dL (ref 30.0–36.0)
MCV: 93.8 fL (ref 80.0–100.0)
Platelets: 225 10*3/uL (ref 150–400)
RBC: 4.71 MIL/uL (ref 3.87–5.11)
RDW: 13.3 % (ref 11.5–15.5)
WBC: 5.1 10*3/uL (ref 4.0–10.5)
nRBC: 0 % (ref 0.0–0.2)

## 2021-09-24 LAB — RESP PANEL BY RT-PCR (FLU A&B, COVID) ARPGX2
Influenza A by PCR: POSITIVE — AB
Influenza B by PCR: NEGATIVE
SARS Coronavirus 2 by RT PCR: NEGATIVE

## 2021-09-24 LAB — URINALYSIS, ROUTINE W REFLEX MICROSCOPIC
Bacteria, UA: NONE SEEN
Bilirubin Urine: NEGATIVE
Glucose, UA: NEGATIVE mg/dL
Ketones, ur: NEGATIVE mg/dL
Leukocytes,Ua: NEGATIVE
Nitrite: NEGATIVE
Protein, ur: NEGATIVE mg/dL
Specific Gravity, Urine: 1.019 (ref 1.005–1.030)
pH: 7 (ref 5.0–8.0)

## 2021-09-24 LAB — HEPATIC FUNCTION PANEL
ALT: 13 U/L (ref 0–44)
AST: 14 U/L — ABNORMAL LOW (ref 15–41)
Albumin: 1.8 g/dL — ABNORMAL LOW (ref 3.5–5.0)
Alkaline Phosphatase: 37 U/L — ABNORMAL LOW (ref 38–126)
Bilirubin, Direct: 0.2 mg/dL (ref 0.0–0.2)
Indirect Bilirubin: 0.4 mg/dL (ref 0.3–0.9)
Total Bilirubin: 0.6 mg/dL (ref 0.3–1.2)
Total Protein: 3.4 g/dL — ABNORMAL LOW (ref 6.5–8.1)

## 2021-09-24 LAB — TROPONIN I (HIGH SENSITIVITY)
Troponin I (High Sensitivity): 4 ng/L (ref <18)
Troponin I (High Sensitivity): 3 ng/L (ref <18)

## 2021-09-24 MED ORDER — IOHEXOL 350 MG/ML SOLN
100.0000 mL | Freq: Once | INTRAVENOUS | Status: AC | PRN
  Administered 2021-09-24: 100 mL via INTRAVENOUS

## 2021-09-24 MED ORDER — KETOROLAC TROMETHAMINE 30 MG/ML IJ SOLN
15.0000 mg | Freq: Once | INTRAMUSCULAR | Status: AC
  Administered 2021-09-24: 15 mg via INTRAVENOUS
  Filled 2021-09-24: qty 1

## 2021-09-24 MED ORDER — ACETAMINOPHEN 500 MG PO TABS
1000.0000 mg | ORAL_TABLET | Freq: Once | ORAL | Status: AC
  Administered 2021-09-24: 1000 mg via ORAL
  Filled 2021-09-24: qty 2

## 2021-09-24 MED ORDER — ONDANSETRON 4 MG PO TBDP: 4.0000 mg | ORAL_TABLET | Freq: Three times a day (TID) | ORAL | 0 refills | Status: DC | PRN

## 2021-09-24 MED ORDER — PROCHLORPERAZINE EDISYLATE 10 MG/2ML IJ SOLN
10.0000 mg | Freq: Once | INTRAMUSCULAR | Status: AC
  Administered 2021-09-24: 10 mg via INTRAVENOUS
  Filled 2021-09-24: qty 2

## 2021-09-24 MED ORDER — SODIUM CHLORIDE 0.9 % IV BOLUS
1000.0000 mL | Freq: Once | INTRAVENOUS | Status: AC
  Administered 2021-09-24: 1000 mL via INTRAVENOUS

## 2021-09-24 MED ORDER — ONDANSETRON HCL 4 MG/2ML IJ SOLN: 4.0000 mg | Freq: Once | INTRAMUSCULAR | Status: DC

## 2021-09-24 MED ORDER — DIPHENHYDRAMINE HCL 50 MG/ML IJ SOLN
12.5000 mg | Freq: Once | INTRAMUSCULAR | Status: AC
  Administered 2021-09-24: 12.5 mg via INTRAVENOUS
  Filled 2021-09-24: qty 1

## 2021-09-24 NOTE — ED Notes (Signed)
Patient stable and discharged with all personal belongings and AVS. AVS and discharge instructions reviewed with patient and opportunity for questions provided.   

## 2021-09-24 NOTE — ED Notes (Addendum)
Pt fell this am approx. 11 am, not sure if hit head. Pt has had dizziness and has not eaten since yesterday afternoon. Reports nausea and no appetite since yesterday. Has severe back pain, pain down lower right leg. Pt reports is limping. No numbness/tingling reported. Also had fall last Weds. Pt has severe headache and reports pain in forehead and frontal sinus area. Also reports sore throat, coughing and sneezing and feeling feverish. Pt's grandson has strep throat and flu and has been exposed to him.
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
## 2021-09-24 NOTE — Discharge Instructions (Signed)
You had the flu which is most likely causing your symptoms and the dehydration caused you to pass out.  Take Tylenol 1 g every 8 hours and use ibuprofen 600 every 6-8 hours with food to help with your muscle aches.  Return to the ER if you develop worsening shortness of breath or any other concerns.
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
## 2021-09-24 NOTE — ED Notes (Signed)
Lab called and requested drawing another green top for Troponin.

## 2021-09-26 IMAGING — MR MR CERVICAL SPINE W/O CM
5 series · 40 of 48 positions shown · non-contrast
Comparison: 04/03/2019

CLINICAL DATA: Cervical radiculopathy.  New onset left arm pain

EXAM:
MRI CERVICAL SPINE WITHOUT CONTRAST
TECHNIQUE: Multiplanar, multisequence MR imaging of the cervical spine was
performed. No intravenous contrast was administered.

[Series 5: T2 · sagittal · 3.0mm · 0.62mm/px · 6 of 15 slices shown (1 of 2)]
[im 1/15]
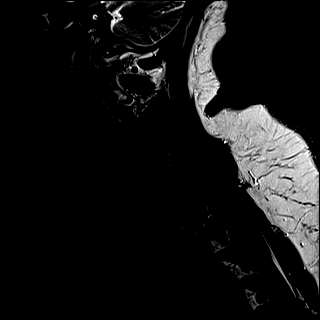
[im 3/15]
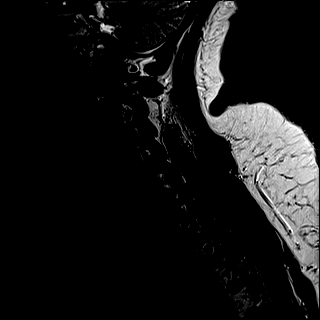
[im 6/15]
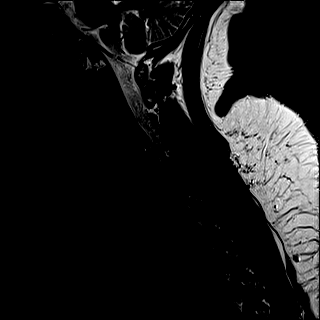
[im 9/15]
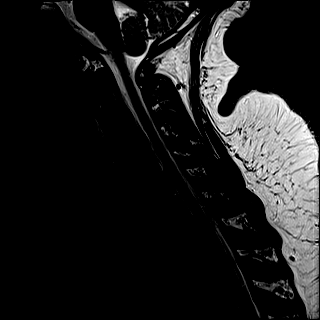
[im 12/15]
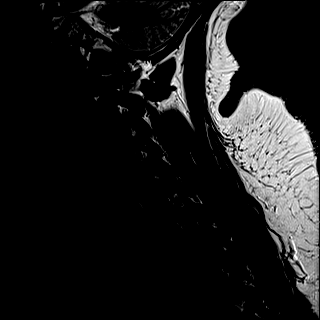
[im 15/15]
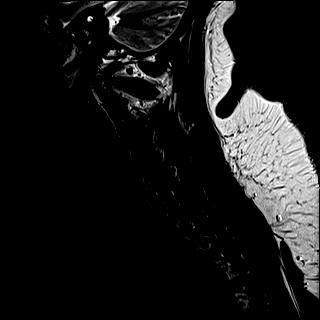

[Series 6: FLAIR · sagittal · 3.0mm · 0.78mm/px · 7 of 15 slices shown]
[im 1/15]
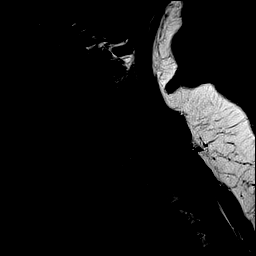
[im 3/15]
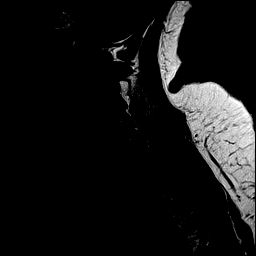
[im 5/15]
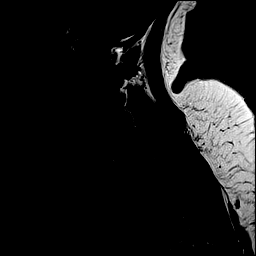
[im 8/15]
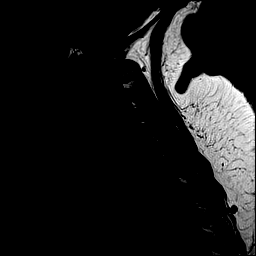
[im 10/15]
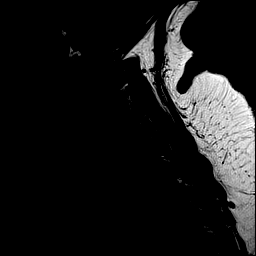
[im 12/15]
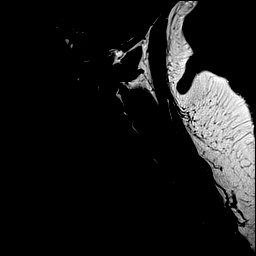
[im 15/15]
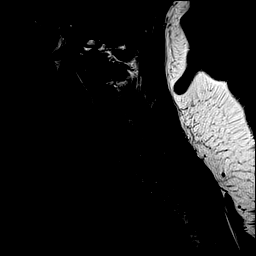

[Series 7: STIR · sagittal · 3.0mm · 0.62mm/px · 7 of 15 slices shown]
[im 1/15]
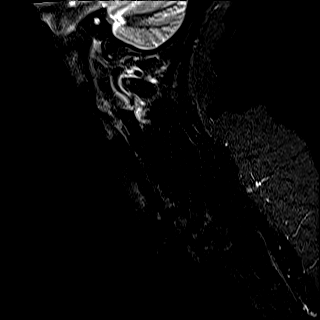
[im 3/15]
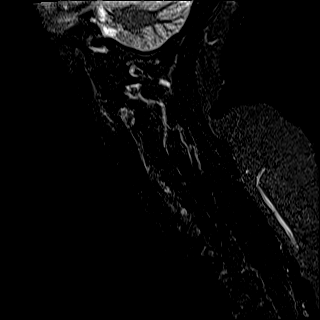
[im 5/15]
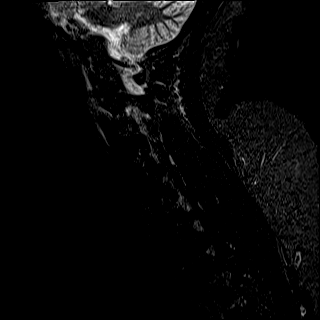
[im 8/15]
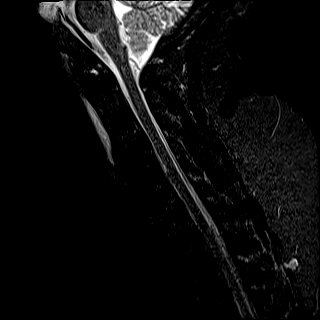
[im 10/15]
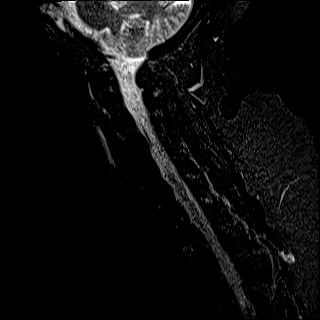
[im 12/15]
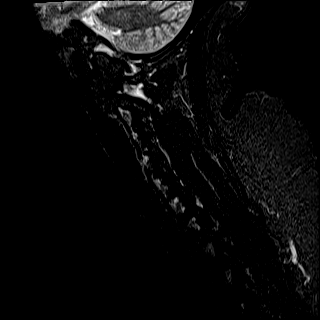
[im 15/15]
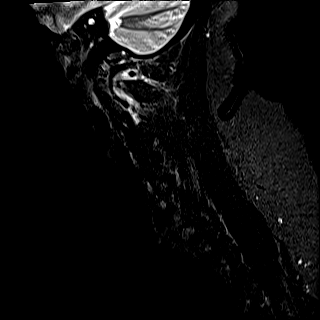

[Series 8: T2 · axial · 3.0mm · 0.70mm/px · z∈[-64,+22]mm · 12 of 29 slices shown (2 of 2)]
[im 1/29]
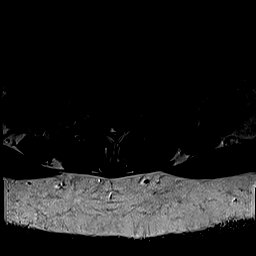
[im 3/29]
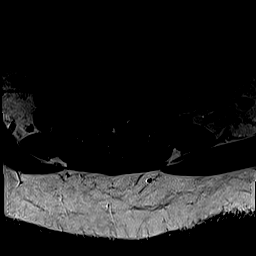
[im 5/29]
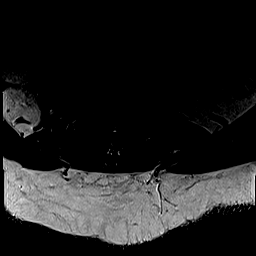
[im 7/29]
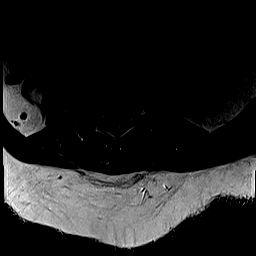
[im 9/29]
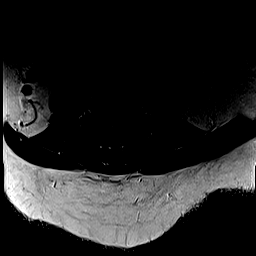
[im 11/29]
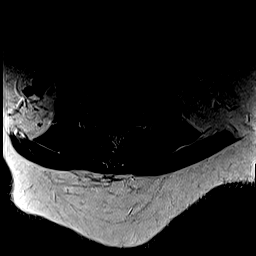
[im 13/29]
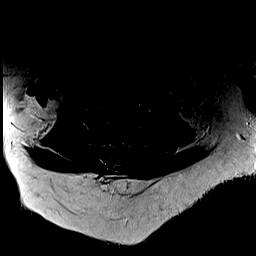
[im 16/29]
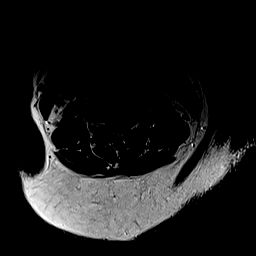
[im 18/29]
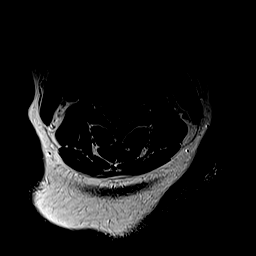
[im 20/29]
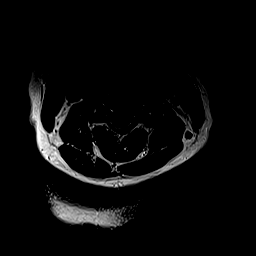
[im 24/29]
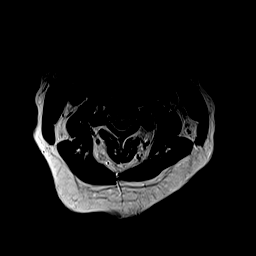
[im 29/29]
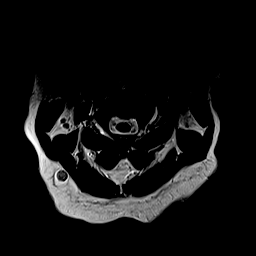

[Series 9: ax mpgr · axial · 3.0mm · 0.35mm/px · z∈[-64,+22]mm · 8 of 29 slices shown]
[im 1/29]
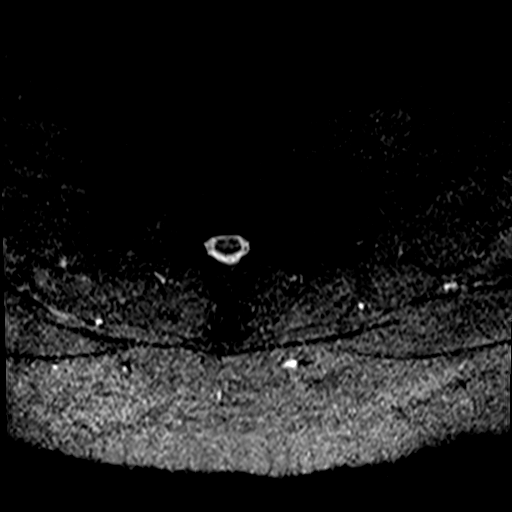
[im 5/29]
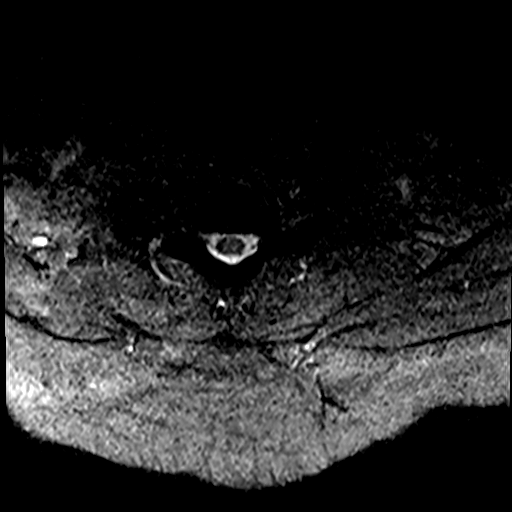
[im 9/29]
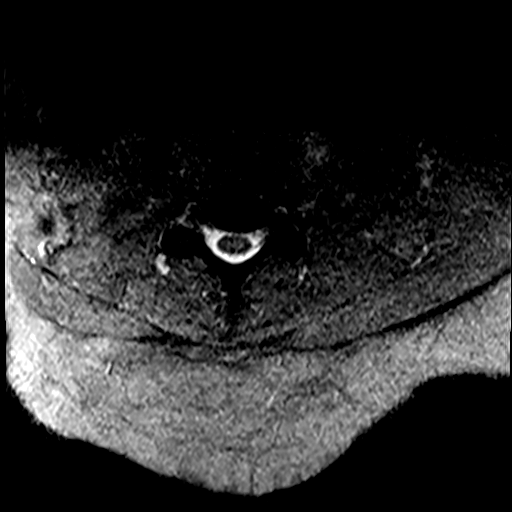
[im 13/29]
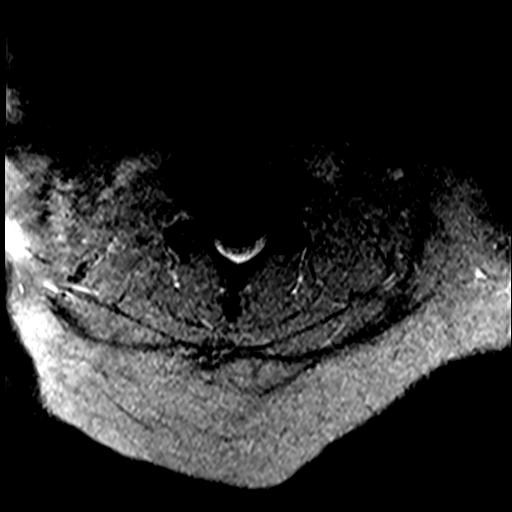
[im 16/29]
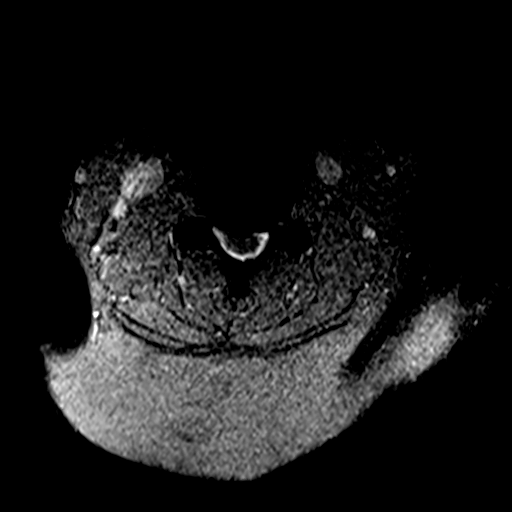
[im 20/29]
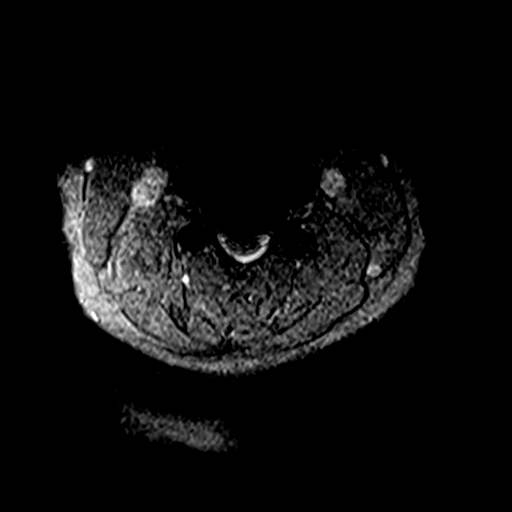
[im 24/29]
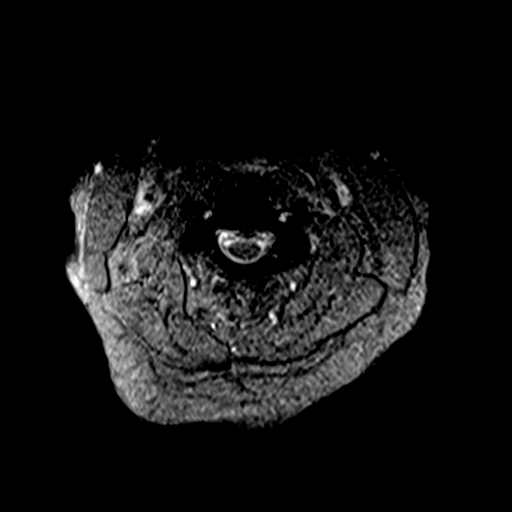
[im 29/29]
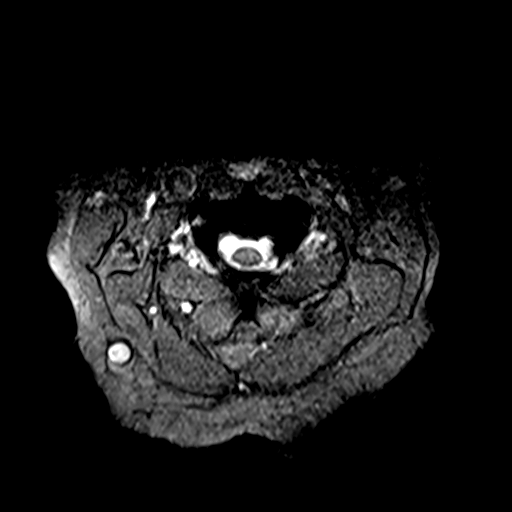

[40 of 48 positions shown; findings below may reference images not displayed]

FINDINGS: Alignment: Straightening of the cervical spine.  No listhesis.

Vertebrae: No fracture, evidence of discitis, or bone lesion.

Cord: Normal signal and morphology.

Posterior Fossa, vertebral arteries, paraspinal tissues:
Prevertebral fluid spanning the anterior arch of C1 to C4-5. No
underlying marrow edema is noted. No detectable calcification or
mass seen in the longus coli muscles (the left-sided longus coli may
be edematous on the uppermost axial T2 weighted slice). Partially
empty sella, incidental.

Disc levels:

C3-4: Shallow central disc protrusion. Shallow left foraminal
protrusion with mild or moderate foraminal narrowing.

C4-5: ACDF with probable solid arthrodesis.  No impingement
IMPRESSION: 1. Retropharyngeal edema without visible underlying cause, often
attributed to calcific tendinitis of the longus coli. Consider neck
CT to determine the presence of soft tissue calcification.
2. C3-4 mild to moderate left foraminal narrowing.

## 2021-10-02 ENCOUNTER — Other Ambulatory Visit: Payer: Self-pay

## 2021-10-02 ENCOUNTER — Emergency Department
Admission: EM | Admit: 2021-10-02 | Discharge: 2021-10-02 | Disposition: A | Discharge status: Home / Self Care | Payer: Medicaid Other | Attending: Emergency Medicine | Admitting: Emergency Medicine

## 2021-10-02 ENCOUNTER — Emergency Department: Payer: Medicaid Other

## 2021-10-02 DIAGNOSIS — Z794 Long term (current) use of insulin: Secondary | ICD-10-CM | POA: Diagnosis not present

## 2021-10-02 DIAGNOSIS — J449 Chronic obstructive pulmonary disease, unspecified: Secondary | ICD-10-CM | POA: Diagnosis not present

## 2021-10-02 DIAGNOSIS — Z87891 Personal history of nicotine dependence: Secondary | ICD-10-CM | POA: Diagnosis not present

## 2021-10-02 DIAGNOSIS — E1142 Type 2 diabetes mellitus with diabetic polyneuropathy: Secondary | ICD-10-CM | POA: Diagnosis not present

## 2021-10-02 DIAGNOSIS — M25562 Pain in left knee: Secondary | ICD-10-CM | POA: Diagnosis present

## 2021-10-02 DIAGNOSIS — E1169 Type 2 diabetes mellitus with other specified complication: Secondary | ICD-10-CM | POA: Insufficient documentation

## 2021-10-02 DIAGNOSIS — Z79899 Other long term (current) drug therapy: Secondary | ICD-10-CM | POA: Insufficient documentation

## 2021-10-02 DIAGNOSIS — M179 Osteoarthritis of knee, unspecified: Secondary | ICD-10-CM | POA: Insufficient documentation

## 2021-10-02 DIAGNOSIS — M79662 Pain in left lower leg: Secondary | ICD-10-CM | POA: Insufficient documentation

## 2021-10-02 DIAGNOSIS — Z85828 Personal history of other malignant neoplasm of skin: Secondary | ICD-10-CM | POA: Diagnosis not present

## 2021-10-02 DIAGNOSIS — I10 Essential (primary) hypertension: Secondary | ICD-10-CM | POA: Insufficient documentation

## 2021-10-02 DIAGNOSIS — Z96659 Presence of unspecified artificial knee joint: Secondary | ICD-10-CM | POA: Insufficient documentation

## 2021-10-02 DIAGNOSIS — E785 Hyperlipidemia, unspecified: Secondary | ICD-10-CM | POA: Insufficient documentation

## 2021-10-02 DIAGNOSIS — M1712 Unilateral primary osteoarthritis, left knee: Secondary | ICD-10-CM

## 2021-10-02 DIAGNOSIS — J45909 Unspecified asthma, uncomplicated: Secondary | ICD-10-CM | POA: Insufficient documentation

## 2021-10-02 MED ORDER — HYDROCODONE-ACETAMINOPHEN 5-325 MG PO TABS: 1.0000 | ORAL_TABLET | Freq: Four times a day (QID) | ORAL | 0 refills | Status: AC | PRN

## 2021-10-02 MED ORDER — HYDROCODONE-ACETAMINOPHEN 5-325 MG PO TABS
1.0000 | ORAL_TABLET | Freq: Once | ORAL | Status: AC
  Administered 2021-10-02: 1 via ORAL
  Filled 2021-10-02: qty 1

## 2021-10-02 NOTE — Discharge Instructions (Signed)
Please follow up with orthopedics as scheduled.  Take the pain medication for severe pain.

## 2021-10-02 NOTE — ED Notes (Signed)
Dppw provided, vs refused at dc Pt given knee brace, andinstructions to apply. Pt assisted off unit via wheelchair.

## 2021-10-02 NOTE — ED Provider Notes (Signed)
[facility] Emergency Department Provider Note ____________________________________________   Event Date/Time   First MD Initiated Contact with Patient 10/02/21 1316     (approximate)  I have reviewed the triage vital signs and the nursing notes.   HISTORY  Chief Complaint Knee Pain  HPI Brenda Hicks is a 55 y.o. female with history of COPD, diabetes, hypertension, TIA presents to the emergency department for treatment and evaluation of left lower extremity pain.  Pain is behind the left knee and radiates into the calf and down into her foot.  She states that it has given out on her twice over the past 2 weeks causing her to fall.  She has been evaluated by primary care who diagnosed her with osteoarthritis.  She has an appointment with orthopedics next week, but pain worsened today to the point where she had to use her walker for weightbearing and decided to come to the emergency department for further evaluation.         Past Medical History:  Diagnosis Date   Anemia    vitamin d deficiency   Arthritis    Asthma    WELL CONTROLLED   Cancer of ear    skin cancer left ear   COPD (chronic obstructive pulmonary disease) ([place])    Diabetes mellitus without complication ([place])    Fatty liver    Hypertension    Kidney cysts    per patient, never had   Renal disorder    Sleep apnea    DOES NOT USE CPAP. waiting for new machine and a new sleep study   Stroke [facility]) May or June 2019   TIA. no residual symptoms    Patient Active Problem List   Diagnosis Date Noted   Pain due to onychomycosis of toenails of both feet 03/05/2021   Plantar fasciitis, bilateral 03/05/2021   Frequent bowel movements 04/25/2020   Abnormal Pap smear 03/31/2020   Arthritis 03/31/2020   Sprain of collateral ligament of right knee 03/10/2020   Leg swelling 02/08/2020   S/P cervical spinal fusion 09/24/2019   Hyperlipidemia associated with type 2 diabetes mellitus ([place])  08/08/2019   Low back pain radiating to left lower extremity 08/07/2019   Nontraumatic incomplete tear of right rotator cuff 01/04/2019   Tendinitis of upper biceps tendon of right shoulder 01/04/2019   Rotator cuff tendinitis, right 12/01/2018   Cervical myelopathy ([place]) 08/09/2018   DJD (degenerative joint disease) of cervical spine 08/04/2018   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   History of ischemic stroke 03/30/2018   Ischemic chest pain ([place]) 03/29/2018   Chest pain 03/29/2018   Sebaceous cyst 06/01/2016   Obstructive apnea 05/31/2016   Acid reflux 05/31/2016   Essential hypertension 05/31/2016   Abnormal Pap smear of cervix 05/31/2016   Arthritis of knee, degenerative 07/25/2015   History of artificial joint 07/25/2015   Gonalgia 02/17/2015   Type 2 diabetes mellitus ([place]) 10/23/2014   Mixed conductive and sensorineural hearing loss, 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/2014   Diabetic polyneuropathy associated with type 2 diabetes mellitus ([place]) 05/16/2014   Endometrial polyp 11/01/2013   Fibroids, intramural 10/09/2013   Pain due to knee joint prosthesis ([place]) 10/05/2013   Body mass index (BMI) of 50-59.9 in adult ([place]) 10/03/2013   Abnormal uterine bleeding 10/03/2013   Morbid obesity ([place]) 10/01/2013   Excessive and frequent menstruation with irregular cycle 10/01/2013   Adaptive colitis 10/01/2013   History of migraine headaches 10/01/2013  H/O malignant neoplasm of skin 10/01/2013   Fatty liver disease, nonalcoholic 49/67/5916   Diverticulitis 10/01/2013   Former smoker 08/29/2013   H/O total knee replacement 08/29/2013   Insomnia 08/29/2013   Dysmenorrhea 08/29/2013   Airway hyperreactivity 08/29/2013   Absolute anemia 08/29/2013    Past Surgical History:  Procedure Laterality Date   ANTERIOR CERVICAL DECOMP/DISCECTOMY FUSION N/A 08/09/2018   Procedure: ANTERIOR CERVICAL DECOMPRESSION/DISCECTOMY FUSION 1 LEVEL-  C4-5;  Surgeon: Meade Maw, MD;  Location: [facility] ORS;  Service: Neurosurgery;  Laterality: N/A;   BACK SURGERY     NECK   COLONOSCOPY WITH PROPOFOL N/A 02/26/2021   Procedure: COLONOSCOPY WITH PROPOFOL;  Surgeon: Jonathon Bellows, MD;  Location: [facility] ENDOSCOPY;  Service: Endoscopy;  Laterality: N/A;   DILATATION & CURETTAGE/HYSTEROSCOPY WITH MYOSURE N/A 01/19/2021   Procedure: DILATATION & CURETTAGE/HYSTEROSCOPY;  Surgeon: Rubie Maid, MD;  Location: [facility] ORS;  Service: Gynecology;  Laterality: N/A;   DILATION AND CURETTAGE OF UTERUS     ENDOMETRIAL ABLATION N/A 01/19/2021   Procedure: ENDOMETRIAL ABLATION, MINERVA;  Surgeon: Rubie Maid, MD;  Location: [facility] ORS;  Service: Gynecology;  Laterality: N/A;   ENDOMETRIAL BIOPSY     benign   EXPLORATORY LAPAROTOMY  1992   REMOVAL OF RUPTURED ECTOPIC   HAND SURGERY Right 1998   cyst removed   HERNIA REPAIR  3846   UMBILICAL   JOINT REPLACEMENT Right 2014   TKR   KNEE ARTHROSCOPY Right 2012   KNEE SURGERY Right 2014   total knee replacement   SHOULDER ARTHROSCOPY WITH BICEPSTENOTOMY Left 12/14/2016   Procedure: SHOULDER ARTHROSCOPY WITH BICEPSTENOTOMY;  Surgeon: Corky Mull, MD;  Location: [facility] ORS;  Service: Orthopedics;  Laterality: Left;   SHOULDER ARTHROSCOPY WITH OPEN ROTATOR CUFF REPAIR Left 12/14/2016   Procedure: SHOULDER ARTHROSCOPY WITH OPEN ROTATOR CUFF REPAIR AND ARTHROSCOPIC ROTATOR CUFF REPAIR;  Surgeon: Corky Mull, MD;  Location: [facility] ORS;  Service: Orthopedics;  Laterality: Left;   SHOULDER ARTHROSCOPY WITH ROTATOR CUFF REPAIR Right 01/04/2019   Procedure: SHOULDER ARTHROSCOPY WITH ROTATOR CUFF REPAIR;  Surgeon: Corky Mull, MD;  Location: [facility] ORS;  Service: Orthopedics;  Laterality: Right;   SHOULDER ARTHROSCOPY WITH SUBACROMIAL DECOMPRESSION Left 12/14/2016   Procedure: SHOULDER ARTHROSCOPY WITH SUBACROMIAL DECOMPRESSION;  Surgeon: Corky Mull, MD;  Location: [facility] ORS;  Service: Orthopedics;  Laterality: Left;   SHOULDER  ARTHROSCOPY WITH SUBACROMIAL DECOMPRESSION AND BICEP TENDON REPAIR Right 01/04/2019   Procedure: SHOULDER ARTHROSCOPY WITH DEBRIDEMENT AND SUBACROMIAL DECOMPRESSION-RIGHT;  Surgeon: Corky Mull, MD;  Location: [facility] ORS;  Service: Orthopedics;  Laterality: Right;   TUBAL LIGATION      Prior to Admission medications   Medication Sig Start Date End Date Taking? Authorizing Provider  HYDROcodone-acetaminophen (NORCO/VICODIN) 5-325 MG tablet Take 1 tablet by mouth every 6 (six) hours as needed for up to 3 days for severe pain. 10/02/21 10/05/21 Yes Christie Viscomi B, FNP  ACCU-CHEK GUIDE test strip USE TO CHECK BLOOD SUGAR UP TO FOUR TIMES DAILY AS DIRECTED 06/10/21   Myles Gip, DO  albuterol (VENTOLIN HFA) 108 (90 Base) MCG/ACT inhaler Inhale 2 puffs into the lungs every 6 (six) hours as needed for wheezing or shortness of breath. 01/21/21   Myles Gip, DO  atorvastatin (LIPITOR) 40 MG tablet Take 1 tablet (40 mg total) by mouth daily. 09/11/21   [name], DO  blood glucose meter kit and supplies Dispense based on patient and insurance preference. Use up to four times daily as directed. (FOR ICD-10  E10.9, E11.9). 01/21/21   Myles Gip, DO  conjugated estrogens (PREMARIN) vaginal cream Place 1 Applicatorful vaginally daily. For 2 wks then 1-3 times a week as needed 08/11/21   Philip Aspen, CNM  cyclobenzaprine (FLEXERIL) 10 MG tablet Take 1 tablet (10 mg total) by mouth 3 (three) times daily as needed. 11/14/20   Menshew, Dannielle Karvonen, PA-C  Dulaglutide (TRULICITY) 5.42 HC/6.2BJ SOPN Inject 0.75 mg into the skin once a week. 09/11/21   [name], DO  gabapentin (NEURONTIN) 300 MG capsule Take 1 capsule (300 mg total) by mouth 3 (three) times daily. 01/14/21 03/15/21  Myles Gip, DO  ibuprofen (ADVIL) 800 MG tablet Take 1 tablet (800 mg total) by mouth every 8 (eight) hours as needed for mild pain or cramping. 01/19/21   Rubie Maid, MD  insulin glargine  (LANTUS) 100 UNIT/ML injection Inject 100 Units into the skin daily.    [provider]  insulin lispro (HUMALOG) 100 UNIT/ML injection Inject 30 Units into the skin 3 (three) times daily before meals.    [provider]  Insulin Syringe-Needle U-100 (INSULIN SYRINGE 1CC/31GX5/16") 31G X 5/16" 1 ML MISC Use 4 times daily as directed. 06/10/21   Myles Gip, DO  ketorolac (TORADOL) 10 MG tablet Take 1 tablet (10 mg total) by mouth every 6 (six) hours as needed. 04/23/21   Sable Feil, PA-C  Lancets (ACCU-CHEK MULTICLIX) lancets Use as instructed 06/10/21   Myles Gip, DO  lisinopril-hydrochlorothiazide (ZESTORETIC) 20-12.5 MG tablet Take 2 tablets by mouth at bedtime. 01/14/21   Myles Gip, DO  ondansetron (ZOFRAN ODT) 4 MG disintegrating tablet Take 1 tablet (4 mg total) by mouth every 8 (eight) hours as needed for nausea or vomiting. 09/24/21   Vanessa [place], MD  ondansetron (ZOFRAN) 4 MG tablet Take 1 tablet by mouth as needed. 02/18/21   [provider]    Allergies Dilaudid [hydromorphone hcl], Morphine and related, and Tramadol  Family History  Problem Relation Age of Onset   Hypertension Mother    Thyroid disease Mother    Lupus Mother    Congestive Heart Failure Mother    CAD Maternal Grandmother    Breast cancer Neg Hx    Ovarian cancer Neg Hx    Colon cancer Neg Hx     Social History Social History   Tobacco Use   Smoking status: Former    Packs/day: 2.00    Years: 25.00    Pack years: 50.00    Types: Cigarettes    Quit date: 06/09/2017    Years since quitting: 4.3   Smokeless tobacco: Never  Vaping Use   Vaping Use: Never used  Substance Use Topics   Alcohol use: No   Drug use: Yes    Frequency: 7.0 times per week    Types: Marijuana    Comment: Pt. uses marjiuana only when she has severe back and side pain    Review of Systems  Constitutional: No fever/chills Eyes: No visual changes. ENT: No sore  throat. Cardiovascular: Denies chest pain. Respiratory: Denies shortness of breath. Gastrointestinal: No abdominal pain.  No nausea, no vomiting.  No diarrhea.  No constipation. Genitourinary: Negative for dysuria. Musculoskeletal: Positive for pain in the left lower extremity. Skin: Negative for rash. Neurological: Negative for headaches, focal weakness or numbness.  ____________________________________________   PHYSICAL EXAM:  VITAL SIGNS: ED Triage Vitals [10/02/21 1230]  Enc Vitals Group     BP (!) 166/93  Pulse Rate 84     Resp 18     Temp 98.9 F (37.2 C)     Temp Source Oral     SpO2 98 %     Weight (!) 314 lb (142.4 kg)     Height '5\' 6"'  (1.676 m)     Head Circumference      Peak Flow      Pain Score 10     Pain Loc      Pain Edu?      Excl. in [place]?     Constitutional: Alert and oriented. Well appearing and in no acute distress. Eyes: Conjunctivae are normal.  Head: Atraumatic. Nose: No congestion/rhinnorhea. Mouth/Throat: Mucous membranes are moist.  Oropharynx non-erythematous. Neck: No stridor.   Hematological/Lymphatic/Immunilogical: No cervical lymphadenopathy. Cardiovascular: Normal rate, regular rhythm. Grossly normal heart sounds.  Good peripheral circulation. Respiratory: Normal respiratory effort.  No retractions. Lungs CTAB. Gastrointestinal: Soft and nontender. No distention. No abdominal bruits.  Musculoskeletal: Pain in the left lower extremity from popliteal area to ankle.  Negative Homans sign.  No peripheral/pitting edema. Neurologic:  Normal speech and language. No gross focal neurologic deficits are appreciated. No gait instability. Skin:  Skin is warm, dry and intact. No rash noted. Psychiatric: Mood and affect are normal. Speech and behavior are normal.  ____________________________________________   LABS (all labs ordered are listed, but only abnormal results are displayed)  Labs Reviewed - No data to  display ____________________________________________  EKG  Not indicated ____________________________________________  RADIOLOGY  ED MD interpretation:    Venous ultrasound of the left lower extremity negative for acute concerns or evidence of DVT per radiology.  I, Sherrie George, personally viewed and evaluated these images (plain radiographs) as part of my medical decision making, as well as reviewing the written report by the radiologist.  Official radiology report(s): US Venous Img Lower Unilateral Left  Result Date: 10/02/2021 CLINICAL DATA:  Left leg pain EXAM: Left LOWER EXTREMITY VENOUS DOPPLER ULTRASOUND TECHNIQUE: Gray-scale sonography with compression, as well as color and duplex ultrasound, were performed to evaluate the deep venous system(s) from the level of the common femoral vein through the popliteal and proximal calf veins. COMPARISON:  None. FINDINGS: VENOUS Normal compressibility of the common femoral, superficial femoral, and popliteal veins, as well as the visualized calf veins. Visualized portions of profunda femoral vein and great saphenous vein unremarkable. No filling defects to suggest DVT on grayscale or color Doppler imaging. Doppler waveforms show normal direction of venous flow, normal respiratory plasticity and response to augmentation. Limited views of the contralateral common femoral vein are unremarkable. OTHER None. Limitations: none IMPRESSION: No DVT identified. Electronically Signed   By: Ofilia Neas M.D.   On: 10/02/2021 14:34    ____________________________________________   PROCEDURES  Procedure(s) performed (including Critical Care):  Procedures  ____________________________________________   INITIAL IMPRESSION / ASSESSMENT AND PLAN     55 year old female presenting to the emergency department for treatment and evaluation of left lower extremity pain.  See HPI for further details.  Patient states that her primary care provider  performed a POCUS of the left lower extremity in her office and did not visualize Baker's cyst or DVT.  X-ray image of the knee obtained on 09/17/2021 showed no acute concerns but does show tricompartmental osteoarthritis.  Plan today will be to get an official radiology reading of venous ultrasound to rule out DVT.  Patient aware and agreeable to the plan.  DIFFERENTIAL DIAGNOSIS  Baker's cyst, DVT, musculoskeletal pain.  ED COURSE  Ultrasound negative for Baker's cyst or DVT.  Chart review shows that she does have tricompartmental arthritis.  Will have her wear a hinged knee brace and follow-up with orthopedics as scheduled next week.     As part of my medical decision making, I reviewed the following data within the [facility] reviewed, Notes from prior ED visits, and [place] Controlled Substance Database  ___________________________________________   FINAL CLINICAL IMPRESSION(S) / ED DIAGNOSES  Final diagnoses:  Tricompartment osteoarthritis of left knee     ED Discharge Orders          Ordered    HYDROcodone-acetaminophen (NORCO/VICODIN) 5-325 MG tablet  Every 6 hours PRN        10/02/21 1503             [place] was evaluated in Emergency Department on 10/02/2021 for the symptoms described in the history of present illness. She was evaluated in the context of the global COVID-19 pandemic, which necessitated consideration that the patient might be at risk for infection with the SARS-CoV-2 virus that causes COVID-19. Institutional protocols and algorithms that pertain to the evaluation of patients at risk for COVID-19 are in a state of rapid change based on information released by regulatory bodies including the CDC and federal and state organizations. These policies and algorithms were followed during the patient's care in the ED.   Note:  This document was prepared using Dragon voice recognition software and may include unintentional  dictation errors.    Victorino Dike, FNP 10/02/21 1710    Duffy Bruce, MD 10/05/21 1614

## 2021-10-02 NOTE — ED Triage Notes (Signed)
Pt states that she has been having left knee pain since last week, states that her left leg keeps going out from under her, states that she woke up with worsening pain behind her left knee and states that she feels like the knee is swollen and reports that she has intermittent pain that radiates up her leg and down the bottom of her foot

## 2021-10-07 ENCOUNTER — Telehealth: Payer: Self-pay | Admitting: Internal Medicine

## 2021-10-07 NOTE — Telephone Encounter (Signed)
Medication Refill - Medication: Hydrocodone   Has the patient contacted their pharmacy? No. Monica, from [facility] [place], calling on behalf of pt. She states that the pt was given this medication in the hospital, and the pt is still experiencing knee pain 10/10. Looking to have PCP prescribe. Please advise.   [phone]   (Agent: If no, request that the patient contact the pharmacy for the refill. If patient does not wish to contact the pharmacy document the reason why and proceed with request.) (Agent: If yes, when and what did the pharmacy advise?)  Preferred Pharmacy (with phone number or street name):  [id number] [address], [place]  [place] [address]  Phone: [phone] Fax: [phone]  Hours: Not open 24 hours   Has the patient been seen for an appointment in the last year OR does the patient have an upcoming appointment? Yes.    Agent: Please be advised that RX refills may take up to 3 business days. We ask that you follow-up with your pharmacy.

## 2021-10-08 DIAGNOSIS — M1712 Unilateral primary osteoarthritis, left knee: Secondary | ICD-10-CM | POA: Insufficient documentation

## 2021-10-08 NOTE — Telephone Encounter (Signed)
Patient notified. Will schedule appointment for evaluation.

## 2021-10-09 ENCOUNTER — Ambulatory Visit: Payer: Medicaid Other | Admitting: Internal Medicine

## 2021-10-09 NOTE — Telephone Encounter (Signed)
Pt was seen by ortho yesterday and has f/u appt 11/30 I told her there was nothing more much we could do and would not be able to give pain meds.  Appt canceled pt will f/u with ortho.  She stated something about ins sent her a letter and it said only Dr. Ky Barban could prescribe controlled substances.  I told her I have never heard that and she should call her ins.

## 2021-10-21 ENCOUNTER — Other Ambulatory Visit
Admission: RE | Admit: 2021-10-21 | Discharge: 2021-10-21 | Disposition: A | Discharge status: Home / Self Care | Payer: Medicaid Other | Source: Ambulatory Visit | Attending: Adult Health Nurse Practitioner | Admitting: Adult Health Nurse Practitioner

## 2021-10-21 ENCOUNTER — Other Ambulatory Visit: Payer: Self-pay

## 2021-10-21 DIAGNOSIS — Z01812 Encounter for preprocedural laboratory examination: Secondary | ICD-10-CM | POA: Diagnosis present

## 2021-10-21 DIAGNOSIS — Z20822 Contact with and (suspected) exposure to covid-19: Secondary | ICD-10-CM | POA: Diagnosis not present

## 2021-10-22 LAB — SARS CORONAVIRUS 2 (TAT 6-24 HRS): SARS Coronavirus 2: NEGATIVE

## 2021-10-23 ENCOUNTER — Ambulatory Visit: Payer: Medicaid Other | Attending: Neurology

## 2021-10-23 DIAGNOSIS — G4701 Insomnia due to medical condition: Secondary | ICD-10-CM | POA: Diagnosis not present

## 2021-10-23 DIAGNOSIS — G4733 Obstructive sleep apnea (adult) (pediatric): Secondary | ICD-10-CM | POA: Insufficient documentation

## 2021-10-26 ENCOUNTER — Other Ambulatory Visit: Payer: Self-pay

## 2021-10-30 ENCOUNTER — Encounter: Payer: Self-pay | Admitting: Family Medicine

## 2021-11-03 ENCOUNTER — Other Ambulatory Visit: Payer: Self-pay

## 2021-11-03 DIAGNOSIS — T84019A Broken internal joint prosthesis, unspecified site, initial encounter: Secondary | ICD-10-CM | POA: Insufficient documentation

## 2021-11-03 DIAGNOSIS — M2550 Pain in unspecified joint: Secondary | ICD-10-CM | POA: Insufficient documentation

## 2021-11-18 ENCOUNTER — Encounter: Payer: Self-pay | Admitting: Family Medicine

## 2021-12-14 ENCOUNTER — Other Ambulatory Visit: Payer: Self-pay

## 2021-12-14 ENCOUNTER — Ambulatory Visit: Payer: Medicaid Other | Admitting: Internal Medicine

## 2021-12-14 ENCOUNTER — Encounter: Payer: Self-pay | Admitting: Internal Medicine

## 2021-12-14 VITALS — BP 132/88 | HR 96 | Temp 98.0°F | Resp 16 | Ht 66.0 in | Wt 314.5 lb

## 2021-12-14 DIAGNOSIS — E1169 Type 2 diabetes mellitus with other specified complication: Secondary | ICD-10-CM | POA: Diagnosis not present

## 2021-12-14 DIAGNOSIS — I10 Essential (primary) hypertension: Secondary | ICD-10-CM

## 2021-12-14 DIAGNOSIS — Z1231 Encounter for screening mammogram for malignant neoplasm of breast: Secondary | ICD-10-CM | POA: Diagnosis not present

## 2021-12-14 DIAGNOSIS — Z794 Long term (current) use of insulin: Secondary | ICD-10-CM

## 2021-12-14 DIAGNOSIS — M1990 Unspecified osteoarthritis, unspecified site: Secondary | ICD-10-CM

## 2021-12-14 DIAGNOSIS — E785 Hyperlipidemia, unspecified: Secondary | ICD-10-CM

## 2021-12-14 DIAGNOSIS — E1165 Type 2 diabetes mellitus with hyperglycemia: Secondary | ICD-10-CM | POA: Diagnosis not present

## 2021-12-14 MED ORDER — INSULIN LISPRO 100 UNIT/ML ~~LOC~~ SOLN: 30.0000 [IU] | Freq: Three times a day (TID) | SUBCUTANEOUS | 3 refills | Status: DC

## 2021-12-14 MED ORDER — ACCU-CHEK GUIDE VI STRP: ORAL_STRIP | 3 refills | Status: DC

## 2021-12-14 MED ORDER — TRULICITY 0.75 MG/0.5ML ~~LOC~~ SOAJ: 0.7500 mg | SUBCUTANEOUS | 3 refills | Status: DC

## 2021-12-14 MED ORDER — "INSULIN SYRINGE 31G X 5/16"" 1 ML MISC": 3 refills | Status: DC

## 2021-12-14 MED ORDER — INSULIN GLARGINE 100 UNIT/ML ~~LOC~~ SOLN: 100.0000 [IU] | Freq: Every day | SUBCUTANEOUS | 3 refills | Status: DC

## 2021-12-14 NOTE — Patient Instructions (Addendum)
It was great seeing you today!  Plan discussed at today's visit: -Blood work ordered today, results will be uploaded to [facility].  -Continue medications, continue to work on weight loss, doing well so far! -CPAP titration   Follow up in: 3 months  Take care and let us know if you have any questions or concerns prior to your next visit.  Dr. Rosana Berger

## 2021-12-14 NOTE — Progress Notes (Signed)
Established Patient Office Visit  Subjective:  Patient ID: Brenda Hicks, female    DOB: 25-Jul-1966  Age: 56 y.o. MRN: [id number]  CC:  Chief Complaint  Patient presents with   Follow-up   Diabetes   Hypertension    HPI Brenda Hicks presents for follow up on chronic medical conditions.   Hypertension, OSA: -Medications: Lisinopril-HCTZ 20-12.48m -Patient is compliant with medications and reports no side effects.  -Checking BP at home: no -Denies any SOB, CP, LE edema, medication SEs, or symptoms of hypotension -Was previously diagnosed with OSA but has been without a CPAP in some time - Sleep study early December showing mild OSA with plans to return to the sleep center for PAP titration on 2/10.    Diabetes, Type 2 -Last A1c 8.8 10/22 -Medications: Lantus 100u daily, Humalog 354STID, Trulicity 05.68on Sundays  -Checking BG at home: fasting - 120-160; post-prandial 160-170; highest 195. No low blood sugars -Diet: working on eating less carbs, lost about 5 pounds -Exercise: increased walking, trying to stay active with grandchildren, starting weight lifting  -Eye exam: last 08/14/20 - appointment made Dec 22, 2021 -Foot exam: 10/22 -Microalbumin: on ACEI -Statin: Lipitor 40 -PNA vaccine: UTD   HLD: -Medications: Lipitor 40 mg new at last visit -Tolerating medication well, no side effects, compliant.  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 Seen by Neurosurg 5/20 for R arm pain, cervical and shoulder MRI unrevealing. Neurosurg recommending considering injections or therapy. Also saw Orthopedics, considering knee replacement.   Past Medical History:  Diagnosis Date   Anemia    vitamin d deficiency   Arthritis    Asthma    WELL CONTROLLED   Cancer of ear    skin cancer left ear   COPD (chronic obstructive pulmonary disease) ([facility]    Diabetes mellitus without complication ([facility]    Fatty liver     Hypertension    Kidney cysts    per patient, never had   Renal disorder    Sleep apnea    DOES NOT USE CPAP. waiting for new machine and a new sleep study   Stroke ([facility] May or June 2019   TIA. no residual symptoms    Past Surgical History:  Procedure Laterality Date   ANTERIOR CERVICAL DECOMP/DISCECTOMY FUSION N/A 08/09/2018   Procedure: ANTERIOR CERVICAL DECOMPRESSION/DISCECTOMY FUSION 1 LEVEL- C4-5;  Surgeon: YMeade Maw MD;  Location: [facility];  Service: Neurosurgery;  Laterality: N/A;   BACK SURGERY     NECK   COLONOSCOPY WITH PROPOFOL N/A 02/26/2021   Procedure: COLONOSCOPY WITH PROPOFOL;  Surgeon: AJonathon Bellows MD;  [facility]  Service: Endoscopy;  Laterality: N/A;   DILATATION & CURETTAGE/HYSTEROSCOPY WITH MYOSURE N/A 01/19/2021   Procedure: DILATATION & CURETTAGE/HYSTEROSCOPY;  Surgeon: CRubie Maid MD;  Location: [facility];  Service: Gynecology;  Laterality: N/A;   DILATION AND CURETTAGE OF UTERUS     ENDOMETRIAL ABLATION N/A 01/19/2021   Procedure: ENDOMETRIAL ABLATION, MINERVA;  Surgeon: CRubie Maid MD;  Location: [facility];  Service: Gynecology;  Laterality: N/A;   ENDOMETRIAL BIOPSY     benign   EXPLORATORY LAPAROTOMY  1992   REMOVAL OF RUPTURED ECTOPIC   HAND SURGERY Right 1998   cyst removed   HERNIA REPAIR  11275  UMBILICAL   JOINT REPLACEMENT Right 2014   TKR   KNEE ARTHROSCOPY Right 2012   KNEE SURGERY Right 2014  total knee replacement   SHOULDER ARTHROSCOPY WITH BICEPSTENOTOMY Left 12/14/2016   Procedure: SHOULDER ARTHROSCOPY WITH BICEPSTENOTOMY;  Surgeon: Corky Mull, MD;  Location: [facility];  Service: Orthopedics;  Laterality: Left;   SHOULDER ARTHROSCOPY WITH OPEN ROTATOR CUFF REPAIR Left 12/14/2016   Procedure: SHOULDER ARTHROSCOPY WITH OPEN ROTATOR CUFF REPAIR AND ARTHROSCOPIC ROTATOR CUFF REPAIR;  Surgeon: Corky Mull, MD;  Location: [facility];  Service: Orthopedics;  Laterality: Left;   SHOULDER ARTHROSCOPY WITH ROTATOR CUFF  REPAIR Right 01/04/2019   Procedure: SHOULDER ARTHROSCOPY WITH ROTATOR CUFF REPAIR;  Surgeon: Corky Mull, MD;  Location: [facility];  Service: Orthopedics;  Laterality: Right;   SHOULDER ARTHROSCOPY WITH SUBACROMIAL DECOMPRESSION Left 12/14/2016   Procedure: SHOULDER ARTHROSCOPY WITH SUBACROMIAL DECOMPRESSION;  Surgeon: Corky Mull, MD;  Location: [facility];  Service: Orthopedics;  Laterality: Left;   SHOULDER ARTHROSCOPY WITH SUBACROMIAL DECOMPRESSION AND BICEP TENDON REPAIR Right 01/04/2019   Procedure: SHOULDER ARTHROSCOPY WITH DEBRIDEMENT AND SUBACROMIAL DECOMPRESSION-RIGHT;  Surgeon: Corky Mull, MD;  Location: [facility];  Service: Orthopedics;  Laterality: Right;   TUBAL LIGATION      Family History  Problem Relation Age of Onset   Hypertension Mother    Thyroid disease Mother    Lupus Mother    Congestive Heart Failure Mother    CAD Maternal Grandmother    Breast cancer Neg Hx    Ovarian cancer Neg Hx    Colon cancer Neg Hx     Social History   Socioeconomic History   Marital status: Married    Spouse name: brian   Number of children: 7   Years of education: Not on file   Highest education level: Not on file  Occupational History   Occupation: disabled    Comment: disabled  Tobacco Use   Smoking status: Former    Packs/day: 2.00    Years: 25.00    Pack years: 50.00    Types: Cigarettes    Quit date: 06/09/2017    Years since quitting: 4.5   Smokeless tobacco: Never  Vaping Use   Vaping Use: Never used  Substance and Sexual Activity   Alcohol use: No   Drug use: Yes    Frequency: 7.0 times per week    Types: Marijuana    Comment: Pt. uses marjiuana only when she has severe back and side pain   Sexual activity: Yes    Partners: Male    Birth control/protection: None  Other Topics Concern   Not on file  Social History Narrative   Have 6 biological childrean and custody of niece since 15 months.   Social Determinants of Health   Financial Resource Strain:  Not on file  Food Insecurity: Not on file  Transportation Needs: Not on file  Physical Activity: Not on file  Stress: Not on file  Social Connections: Not on file  Intimate Partner Violence: Not on file    Outpatient Medications Prior to Visit  Medication Sig Dispense Refill   ACCU-CHEK GUIDE test strip USE TO CHECK BLOOD SUGAR UP TO FOUR TIMES DAILY AS DIRECTED 100 each 3   albuterol (VENTOLIN HFA) 108 (90 Base) MCG/ACT inhaler Inhale 2 puffs into the lungs every 6 (six) hours as needed for wheezing or shortness of breath. 18 g 3   atorvastatin (LIPITOR) 40 MG tablet Take 1 tablet (40 mg total) by mouth daily. 90 tablet 3   blood glucose meter kit and supplies Dispense based on patient and insurance preference. Use up to  four times daily as directed. (FOR ICD-10 E10.9, E11.9). 1 each 3   conjugated estrogens (PREMARIN) vaginal cream Place 1 Applicatorful vaginally daily. For 2 wks then 1-3 times a week as needed 42.5 g 12   cyclobenzaprine (FLEXERIL) 10 MG tablet Take 1 tablet (10 mg total) by mouth 3 (three) times daily as needed. 30 tablet 1   Dulaglutide (TRULICITY) 5.63 JS/9.7WY SOPN Inject 0.75 mg into the skin once a week. 2 mL 3   gabapentin (NEURONTIN) 300 MG capsule Take 1 capsule (300 mg total) by mouth 3 (three) times daily. 90 capsule 1   ibuprofen (ADVIL) 800 MG tablet Take 1 tablet (800 mg total) by mouth every 8 (eight) hours as needed for mild pain or cramping. 30 tablet 1   insulin glargine (LANTUS) 100 UNIT/ML injection Inject 100 Units into the skin daily.     insulin lispro (HUMALOG) 100 UNIT/ML injection Inject 30 Units into the skin 3 (three) times daily before meals.     Insulin Syringe-Needle U-100 (INSULIN SYRINGE 1CC/31GX5/16") 31G X 5/16" 1 ML MISC Use 4 times daily as directed. 100 each 3   ketorolac (TORADOL) 10 MG tablet Take 1 tablet (10 mg total) by mouth every 6 (six) hours as needed. 20 tablet 0   Lancets (ACCU-CHEK MULTICLIX) lancets Use as instructed 100  each 12   lisinopril-hydrochlorothiazide (ZESTORETIC) 20-12.5 MG tablet Take 2 tablets by mouth at bedtime. 180 tablet 3   ondansetron (ZOFRAN ODT) 4 MG disintegrating tablet Take 1 tablet (4 mg total) by mouth every 8 (eight) hours as needed for nausea or vomiting. 20 tablet 0   ondansetron (ZOFRAN) 4 MG tablet Take 1 tablet by mouth as needed.     No facility-administered medications prior to visit.    Allergies  Allergen Reactions   Dilaudid [Hydromorphone Hcl] Nausea And Vomiting   Morphine And Related Nausea And Vomiting   Tramadol Nausea Only    If she takes antinausea medicine with this medicine, then she can tolerate it.    ROS Review of Systems  Constitutional:  Positive for appetite change. Negative for chills and fever.  Eyes:  Negative for visual disturbance.  Respiratory:  Negative for cough and shortness of breath.   Cardiovascular:  Negative for chest pain.  Gastrointestinal:  Negative for abdominal pain, diarrhea, nausea and vomiting.  Neurological:  Negative for dizziness and headaches.     Objective:    Physical Exam Constitutional:      Appearance: Normal appearance. She is obese.  HENT:     Head: Normocephalic and atraumatic.  Eyes:     Conjunctiva/sclera: Conjunctivae normal.  Cardiovascular:     Rate and Rhythm: Normal rate and regular rhythm.  Pulmonary:     Effort: Pulmonary effort is normal.     Breath sounds: Normal breath sounds.  Musculoskeletal:     Right lower leg: No edema.     Left lower leg: No edema.  Skin:    General: Skin is warm and dry.  Neurological:     General: No focal deficit present.     Mental Status: She is alert. Mental status is at baseline.  Psychiatric:        Mood and Affect: Mood normal.        Behavior: Behavior normal.    BP 132/88    Pulse 96    Temp 98 F (36.7 C)    Resp 16    Ht '5\' 6"'  (1.676 m)    Wt (!) 314  lb 8 oz (142.7 kg)    SpO2 98%    BMI 50.76 kg/m  Wt Readings from Last 3 Encounters:  12/14/21  (!) 314 lb 8 oz (142.7 kg)  10/02/21 (!) 314 lb (142.4 kg)  09/24/21 (!) 314 lb 3.2 oz (142.5 kg)     Health Maintenance Due  Topic Date Due   MAMMOGRAM  Never done   Zoster Vaccines- Shingrix (1 of 2) Never done   OPHTHALMOLOGY EXAM  08/14/2021   PAP SMEAR-Modifier  01/28/2022    There are no preventive care reminders to display for this patient.  Lab Results  Component Value Date   TSH 2.91 08/07/2019   Lab Results  Component Value Date   WBC 5.1 09/24/2021   HGB 13.9 09/24/2021   HCT 44.2 09/24/2021   MCV 93.8 09/24/2021   PLT 225 09/24/2021   Lab Results  Component Value Date   NA 133 (L) 09/24/2021   K 4.2 09/24/2021   CO2 23 09/24/2021   GLUCOSE 227 (H) 09/24/2021   BUN 10 09/24/2021   CREATININE 0.76 09/24/2021   BILITOT 0.6 09/24/2021   ALKPHOS 37 (L) 09/24/2021   AST 14 (L) 09/24/2021   ALT 13 09/24/2021   PROT 3.4 (L) 09/24/2021   ALBUMIN 1.8 (L) 09/24/2021   CALCIUM 8.6 (L) 09/24/2021   ANIONGAP 10 09/24/2021   Lab Results  Component Value Date   CHOL 164 01/14/2021   Lab Results  Component Value Date   HDL 37 (L) 01/14/2021   Lab Results  Component Value Date   LDLCALC 104 (H) 01/14/2021   Lab Results  Component Value Date   TRIG 126 01/14/2021   Lab Results  Component Value Date   CHOLHDL 4.4 01/14/2021   Lab Results  Component Value Date   HGBA1C 8.8 (A) 09/11/2021      Assessment & Plan:   1. Type 2 diabetes mellitus with hyperglycemia, with long-term current use of insulin ([place]): Needing refills on insulin today, discussed how weight loss will require less insulin and to keep checking sugars regularly and let me know if she has ny hypoglycemic events or if her sugar is low. Doing well on Trulicity, decreasing her appetite and helping her lose weight. Recheck A1c today. Plan to follow up in 3 months.   - HgB A1c - insulin glargine (LANTUS) 100 UNIT/ML injection; Inject 1 mL (100 Units total) into the skin daily.  Dispense: 10  mL; Refill: 3 - insulin lispro (HUMALOG) 100 UNIT/ML injection; Inject 0.3 mLs (30 Units total) into the skin 3 (three) times daily before meals.  Dispense: 10 mL; Refill: 3 - Dulaglutide (TRULICITY) 1.51 VO/1.6WV SOPN; Inject 0.75 mg into the skin once a week.  Dispense: 2 mL; Refill: 3 - Insulin Syringe-Needle U-100 (INSULIN SYRINGE 1CC/31GX5/16") 31G X 5/16" 1 ML MISC; Use 4 times daily as directed.  Dispense: 100 each; Refill: 3 - ACCU-CHEK GUIDE test strip; USE TO CHECK BLOOD SUGAR UP TO FOUR TIMES DAILY AS DIRECTED  Dispense: 100 each; Refill: 3  2. Essential hypertension: Stable, continue medications.  3. Hyperlipidemia associated with type 2 diabetes mellitus ([place]): Stable, been on Lipitor for 3 months now, recheck lipid panel. - Lipid Profile  4. Encounter for screening mammogram for malignant neoplasm of breast: Mammogram ordered.   - MM Digital Screening; Future  5. Arthritis: Following with Ortho, discussing possible knee replacement.   Follow-up: Return in about 3 months (around 03/14/2022).    [name], DO

## 2021-12-15 LAB — LIPID PANEL
Cholesterol: 185 mg/dL (ref <200)
HDL: 41 mg/dL — ABNORMAL LOW (ref >=50)
LDL Cholesterol (Calc): 122 mg/dL (calc) — ABNORMAL HIGH
Non-HDL Cholesterol (Calc): 144 mg/dL (calc) — ABNORMAL HIGH (ref <130)
Total CHOL/HDL Ratio: 4.5 (calc) (ref <5.0)
Triglycerides: 109 mg/dL (ref <150)

## 2021-12-15 LAB — HEMOGLOBIN A1C
Hgb A1c MFr Bld: 8.4 % of total Hgb — ABNORMAL HIGH (ref <5.7)
Mean Plasma Glucose: 194 mg/dL
eAG (mmol/L): 10.8 mmol/L

## 2021-12-17 ENCOUNTER — Encounter: Payer: Self-pay | Admitting: Family Medicine

## 2021-12-17 DIAGNOSIS — M1712 Unilateral primary osteoarthritis, left knee: Secondary | ICD-10-CM

## 2021-12-17 DIAGNOSIS — M5412 Radiculopathy, cervical region: Secondary | ICD-10-CM

## 2021-12-17 NOTE — Addendum Note (Signed)
Addended by: Docia Furl on: 12/17/2021 04:08 PM   Modules accepted: Orders

## 2021-12-29 ENCOUNTER — Telehealth: Payer: Self-pay

## 2021-12-29 NOTE — Telephone Encounter (Signed)
Has referral been sent to [facility] ortho Dr. Benson Setting?

## 2021-12-29 NOTE — Telephone Encounter (Signed)
PT said that the dr we sent her to will not be able to do her surgery and she needs another referral for another dr.

## 2021-12-31 ENCOUNTER — Encounter: Payer: Medicaid Other | Admitting: Obstetrics and Gynecology

## 2022-01-01 ENCOUNTER — Ambulatory Visit: Payer: Medicaid Other | Attending: Neurology

## 2022-01-01 DIAGNOSIS — G4733 Obstructive sleep apnea (adult) (pediatric): Secondary | ICD-10-CM | POA: Diagnosis present

## 2022-01-07 ENCOUNTER — Other Ambulatory Visit: Payer: Self-pay

## 2022-01-11 ENCOUNTER — Encounter: Payer: Self-pay | Admitting: Internal Medicine

## 2022-01-11 ENCOUNTER — Encounter: Payer: Self-pay | Admitting: Cardiology

## 2022-01-11 ENCOUNTER — Ambulatory Visit: Payer: Medicaid Other | Admitting: Internal Medicine

## 2022-01-11 VITALS — BP 126/88 | HR 95 | Temp 97.7°F | Resp 16 | Ht 66.0 in | Wt 310.2 lb

## 2022-01-11 DIAGNOSIS — M5412 Radiculopathy, cervical region: Secondary | ICD-10-CM | POA: Diagnosis not present

## 2022-01-11 DIAGNOSIS — M62838 Other muscle spasm: Secondary | ICD-10-CM | POA: Diagnosis not present

## 2022-01-11 MED ORDER — TIZANIDINE HCL 4 MG PO TABS: 4.0000 mg | ORAL_TABLET | Freq: Four times a day (QID) | ORAL | 0 refills | Status: DC | PRN
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
## 2022-01-11 NOTE — Patient Instructions (Addendum)
It was great seeing you today!  Plan discussed at today's visit: -Referral back to neurosurgery placed -Muscle relaxer sent to pharmacy, take at night and do not drive until you know how this affects you.   Follow up in: April  Take care and let us know if you have any questions or concerns prior to your next visit.  Dr. Rosana Berger

## 2022-01-16 NOTE — Progress Notes (Signed)
Cardiology Office Note:    Date:  01/18/2022   ID:  SHRISTI Hicks, DOB 1966/01/06, MRN [id number]  PCP:  Brenda Medici, DO  [facility] Cardiologist:  Brenda Sable, MD  [place] Electrophysiologist:  None   Referring MD: Brenda Medici, DO   Chief Complaint: 6 month follow-up/chest pain  History of Present Illness:    Brenda Hicks is a 56 y.o. female with a hx of HTN, HLD, former smoker, morbid obesity, diabetes who presents for 6 month follow-up.   Previously seen for palpitations and sharp chest discomfort, suspected non cardiac. Cardiac monitor did not show significant arrythmia, pSVT, 5 beats VT. Last seen 04/28/21 and BB therapy dicussed, but patient wanted to defer at the time.   Today, the patient reports chest discomfort. It feels like a gas bubble in the left side of the chest, chest tightness. Shooting pain goes down the arms and hands go numb. This started 30 days ago. Has been occurring every couple days. Worse with stress. The pain will wake patient up in her sleep. Also has SOB with exertion. Occasional dependent edema. No orthopnea or pnd.   Past Medical History:  Diagnosis Date   Anemia    vitamin d deficiency   Arthritis    Asthma    WELL CONTROLLED   Cancer of ear    skin cancer left ear   COPD (chronic obstructive pulmonary disease) ([place])    Diabetes mellitus without complication ([place])    Fatty liver    Hypertension    Kidney cysts    per patient, never had   Renal disorder    Sleep apnea    DOES NOT USE CPAP. waiting for new machine and a new sleep study   Stroke [facility]) May or June 2019   TIA. no residual symptoms    Past Surgical History:  Procedure Laterality Date   ANTERIOR CERVICAL DECOMP/DISCECTOMY FUSION N/A 08/09/2018   Procedure: ANTERIOR CERVICAL DECOMPRESSION/DISCECTOMY FUSION 1 LEVEL- C4-5;  Surgeon: Meade Maw, MD;  Location: [facility] ORS;  Service: Neurosurgery;  Laterality: N/A;   BACK SURGERY     NECK    COLONOSCOPY WITH PROPOFOL N/A 02/26/2021   Procedure: COLONOSCOPY WITH PROPOFOL;  Surgeon: Jonathon Bellows, MD;  Location: [facility] ENDOSCOPY;  Service: Endoscopy;  Laterality: N/A;   DILATATION & CURETTAGE/HYSTEROSCOPY WITH MYOSURE N/A 01/19/2021   Procedure: DILATATION & CURETTAGE/HYSTEROSCOPY;  Surgeon: Rubie Maid, MD;  Location: [facility] ORS;  Service: Gynecology;  Laterality: N/A;   DILATION AND CURETTAGE OF UTERUS     ENDOMETRIAL ABLATION N/A 01/19/2021   Procedure: ENDOMETRIAL ABLATION, MINERVA;  Surgeon: Rubie Maid, MD;  Location: [facility] ORS;  Service: Gynecology;  Laterality: N/A;   ENDOMETRIAL BIOPSY     benign   EXPLORATORY LAPAROTOMY  1992   REMOVAL OF RUPTURED ECTOPIC   HAND SURGERY Right 1998   cyst removed   HERNIA REPAIR  0263   UMBILICAL   JOINT REPLACEMENT Right 2014   TKR   KNEE ARTHROSCOPY Right 2012   KNEE SURGERY Right 2014   total knee replacement   SHOULDER ARTHROSCOPY WITH BICEPSTENOTOMY Left 12/14/2016   Procedure: SHOULDER ARTHROSCOPY WITH BICEPSTENOTOMY;  Surgeon: Corky Mull, MD;  Location: [facility] ORS;  Service: Orthopedics;  Laterality: Left;   SHOULDER ARTHROSCOPY WITH OPEN ROTATOR CUFF REPAIR Left 12/14/2016   Procedure: SHOULDER ARTHROSCOPY WITH OPEN ROTATOR CUFF REPAIR AND ARTHROSCOPIC ROTATOR CUFF REPAIR;  Surgeon: Corky Mull, MD;  Location: [facility] ORS;  Service: Orthopedics;  Laterality: Left;  SHOULDER ARTHROSCOPY WITH ROTATOR CUFF REPAIR Right 01/04/2019   Procedure: SHOULDER ARTHROSCOPY WITH ROTATOR CUFF REPAIR;  Surgeon: Corky Mull, MD;  Location: [facility] ORS;  Service: Orthopedics;  Laterality: Right;   SHOULDER ARTHROSCOPY WITH SUBACROMIAL DECOMPRESSION Left 12/14/2016   Procedure: SHOULDER ARTHROSCOPY WITH SUBACROMIAL DECOMPRESSION;  Surgeon: Corky Mull, MD;  Location: [facility] ORS;  Service: Orthopedics;  Laterality: Left;   SHOULDER ARTHROSCOPY WITH SUBACROMIAL DECOMPRESSION AND BICEP TENDON REPAIR Right 01/04/2019   Procedure: SHOULDER ARTHROSCOPY WITH  DEBRIDEMENT AND SUBACROMIAL DECOMPRESSION-RIGHT;  Surgeon: Corky Mull, MD;  Location: [facility] ORS;  Service: Orthopedics;  Laterality: Right;   TUBAL LIGATION      Current Medications: Current Meds  Medication Sig   ACCU-CHEK GUIDE test strip USE TO CHECK BLOOD SUGAR UP TO FOUR TIMES DAILY AS DIRECTED   albuterol (VENTOLIN HFA) 108 (90 Base) MCG/ACT inhaler Inhale 2 puffs into the lungs every 6 (six) hours as needed for wheezing or shortness of breath.   atorvastatin (LIPITOR) 40 MG tablet Take 1 tablet (40 mg total) by mouth daily.   blood glucose meter kit and supplies Dispense based on patient and insurance preference. Use up to four times daily as directed. (FOR ICD-10 E10.9, E11.9).   conjugated estrogens (PREMARIN) vaginal cream Place 1 Applicatorful vaginally daily. For 2 wks then 1-3 times a week as needed   Dulaglutide (TRULICITY) 1.65 VV/7.4MO SOPN Inject 0.75 mg into the skin once a week.   gabapentin (NEURONTIN) 300 MG capsule Take 1 capsule (300 mg total) by mouth 3 (three) times daily.   ibuprofen (ADVIL) 800 MG tablet Take 1 tablet (800 mg total) by mouth every 8 (eight) hours as needed for mild pain or cramping.   insulin glargine (LANTUS) 100 UNIT/ML injection Inject 1 mL (100 Units total) into the skin daily.   insulin lispro (HUMALOG) 100 UNIT/ML injection Inject 0.3 mLs (30 Units total) into the skin 3 (three) times daily before meals.   Insulin Syringe-Needle U-100 (INSULIN SYRINGE 1CC/31GX5/16") 31G X 5/16" 1 ML MISC Use 4 times daily as directed.   ketorolac (TORADOL) 10 MG tablet Take 1 tablet (10 mg total) by mouth every 6 (six) hours as needed.   Lancets (ACCU-CHEK MULTICLIX) lancets Use as instructed   lisinopril-hydrochlorothiazide (ZESTORETIC) 20-12.5 MG tablet Take 2 tablets by mouth at bedtime.   ondansetron (ZOFRAN ODT) 4 MG disintegrating tablet Take 1 tablet (4 mg total) by mouth every 8 (eight) hours as needed for nausea or vomiting.   ondansetron (ZOFRAN) 4  MG tablet Take 1 tablet by mouth as needed.   tiZANidine (ZANAFLEX) 4 MG tablet Take 1 tablet (4 mg total) by mouth every 6 (six) hours as needed for muscle spasms.     Allergies:   Dilaudid [hydromorphone hcl], Morphine and related, and Tramadol   Social History   Socioeconomic History   Marital status: Married    Spouse name: brian   Number of children: 7   Years of education: Not on file   Highest education level: Not on file  Occupational History   Occupation: disabled    Comment: disabled  Tobacco Use   Smoking status: Former    Packs/day: 2.00    Years: 25.00    Pack years: 50.00    Types: Cigarettes    Quit date: 06/09/2017    Years since quitting: 4.6   Smokeless tobacco: Never  Vaping Use   Vaping Use: Never used  Substance and Sexual Activity   Alcohol use: No  Drug use: Yes    Frequency: 7.0 times per week    Types: Marijuana    Comment: Pt. uses marjiuana only when she has severe back and side pain   Sexual activity: Yes    Partners: Male    Birth control/protection: None  Other Topics Concern   Not on file  Social History Narrative   Have 6 biological childrean and custody of niece since 40 months.   Social Determinants of Health   Financial Resource Strain: Not on file  Food Insecurity: Not on file  Transportation Needs: Not on file  Physical Activity: Not on file  Stress: Not on file  Social Connections: Not on file     Family History: The patient's family history includes CAD in her maternal grandmother; Congestive Heart Failure in her mother; Hypertension in her brother and mother; Lupus in her mother; Thyroid disease in her mother. There is no history of Breast cancer, Ovarian cancer, or Colon cancer.  ROS:   Please see the history of present illness.     All other systems reviewed and are negative.  EKGs/Labs/Other Studies Reviewed:    The following studies were reviewed today:  Heart monitor 03/2021 Monitor 1 Patient had a min HR of  56 bpm, max HR of 182 bpm, and avg HR of 89 bpm. Predominant underlying rhythm was Sinus Rhythm. 1 run of Supraventricular Tachycardia occurred lasting 9.0 secs with a max rate of 182 bpm (avg 164 bpm). True duration of  Supraventricular Tachycardia difficult to ascertain due to artifact. Idioventricular Rhythm was present. Isolated SVEs were rare (<1.0%), SVE Couplets were rare (<1.0%), and no SVE Triplets were present. Isolated VEs were rare (<1.0%), and no VE Couplets  or VE Triplets were present.   Monitor 2 Patient had a min HR of 50 bpm, max HR of 162 bpm, and avg HR of 80 bpm. Predominant underlying rhythm was Sinus Rhythm. 1 run of Ventricular Tachycardia occurred lasting 5 beats with a max rate of 162 bpm (avg 143 bpm). Isolated SVEs were rare (<1.0%),  SVE Couplets were rare (<1.0%), and no SVE Triplets were present. Isolated VEs were rare (<1.0%), and no VE Couplets or VE Triplets were present.    Occasional paroxysmal SVT noted, not associated with patient triggered events.  Overall benign cardiac monitor with no significant arrhythmias.     Echo 03/2018 Study Conclusions   - Left ventricle: The cavity size was normal. Systolic function was    normal. The estimated ejection fraction was in the range of 60%    to 65%.  - Aortic valve: Valve area (VTI): 4.2 cm^2. Valve area (Vmax): 3.91    cm^2. Valve area (Vmean): 3.44 cm^2.  - Mitral valve: Valve area by continuity equation (using LVOT    flow): 4.99 cm^2.   EKG:  EKG is  ordered today.  The ekg ordered today demonstrates NSR, 72bpm, nonspecific t wave abnormality, no changes  Recent Labs: 09/24/2021: ALT 13; BUN 10; Creatinine, Ser 0.76; Hemoglobin 13.9; Platelets 225; Potassium 4.2; Sodium 133  Recent Lipid Panel    Component Value Date/Time   CHOL 185 12/14/2021 1019   TRIG 109 12/14/2021 1019   HDL 41 (L) 12/14/2021 1019   CHOLHDL 4.5 12/14/2021 1019   VLDL 20 03/31/2018 0539   LDLCALC 122 (H) 12/14/2021 1019      Physical Exam:    VS:  BP 120/90 (BP Location: Left Arm, Patient Position: Sitting, Cuff Size: Large)    Pulse 72  Ht '5\' 6"'  (1.676 m)    Wt (!) 313 lb (142 kg)    SpO2 98%    BMI 50.52 kg/m     Wt Readings from Last 3 Encounters:  01/18/22 (!) 313 lb (142 kg)  01/11/22 (!) 310 lb 3.2 oz (140.7 kg)  12/14/21 (!) 314 lb 8 oz (142.7 kg)     GEN:  Well nourished, well developed in no acute distress HEENT: Normal NECK: No JVD; No carotid bruits LYMPHATICS: No lymphadenopathy CARDIAC: RRR, no murmurs, rubs, gallops; TTP RESPIRATORY:  Clear to auscultation without rales, wheezing or rhonchi  ABDOMEN: Soft, non-tender, non-distended MUSCULOSKELETAL:  No edema; No deformity  SKIN: Warm and dry NEUROLOGIC:  Alert and oriented x 3 PSYCHIATRIC:  Normal affect   ASSESSMENT:    1. Chest pain of uncertain etiology   2. Precordial pain   3. Paroxysmal SVT (supraventricular tachycardia) (HCC)   4. Palpitations   5. Morbid obesity ([place])    PLAN:    In order of problems listed above:  Chest pain Chest pain sounds MSK in nature, low suspicion for cardiac etiology. Also with tenderness on palpation. MPI in 2019 with no significant ischemia and artifact. EKG today with no ischemic changes. Discussed options with the patient and she would like to pursue a Cardiac CTA. Continue Lipitor.   Palpitations Prior heart monitor showed pSVT and 5 beats NSVT. No further palpitations reported.   HTN BP 120/90 today. Continue lisinopril-HCTZ 20-12.23m daily.   Morbid obesity Weight loss recommended.   Disposition: Follow up in 6-8 weeks week(s) with MD/aPP      Signed, Leavy Heatherly HNinfa Meeker PA-C  01/18/2022 2:10 PM    [facility]

## 2022-01-18 ENCOUNTER — Ambulatory Visit (INDEPENDENT_AMBULATORY_CARE_PROVIDER_SITE_OTHER): Payer: Medicaid Other | Admitting: Medical

## 2022-01-18 ENCOUNTER — Other Ambulatory Visit: Payer: Self-pay

## 2022-01-18 ENCOUNTER — Encounter: Payer: Self-pay | Admitting: Medical

## 2022-01-18 VITALS — BP 120/90 | HR 72 | Ht 66.0 in | Wt 313.0 lb

## 2022-01-18 DIAGNOSIS — R002 Palpitations: Secondary | ICD-10-CM

## 2022-01-18 DIAGNOSIS — R072 Precordial pain: Secondary | ICD-10-CM | POA: Diagnosis not present

## 2022-01-18 DIAGNOSIS — R079 Chest pain, unspecified: Secondary | ICD-10-CM

## 2022-01-18 DIAGNOSIS — I471 Supraventricular tachycardia: Secondary | ICD-10-CM

## 2022-01-18 NOTE — Patient Instructions (Signed)
Medication Instructions:   Your physician recommends that you continue on your current medications as directed. Please refer to the Current Medication list given to you today.   *If you need a refill on your cardiac medications before your next appointment, please call your pharmacy*   Lab Work:  Your provider has ordered labs (BMET) to be drawn before you leave today.   If you have labs (blood work) drawn today and your tests are completely normal, you will receive your results only by: [place] (if you have MyChart) OR A paper copy in the mail If you have any lab test that is abnormal or we need to change your treatment, we will call you to review the results.   Testing/Procedures: Your cardiac CT has been scheduled for Thursday, March 9th at 8 am at:  [facility] [place], [place] [address] [phone]  Please arrive 15 mins early for check-in and test prep.    Please follow these instructions carefully (unless otherwise directed):  Hold all erectile dysfunction medications at least 3 days (72 hrs) prior to test.  On the Night Before the Test: Be sure to Drink plenty of water. Do not consume any caffeinated/decaffeinated beverages or chocolate 12 hours prior to your test.  On the Day of the Test: Drink plenty of water until 1 hour prior to the test. Do not eat any food 4 hours prior to the test. You may take your regular medications prior to the test.  HOLD lisinopril-HCTZ morning of the test. FEMALES- please wear underwire-free bra if available, avoid dresses & tight clothing      After the Test: Drink plenty of water. After receiving IV contrast, you may experience a mild flushed feeling. This is normal. On occasion, you may experience a mild rash up to 24 hours after the test. This is not dangerous. If this occurs, you can take Benadryl 25 mg and increase your fluid intake. If you experience  trouble breathing, this can be serious. If it is severe call 911 IMMEDIATELY. If it is mild, please call our office.  Please allow 2-4 weeks for scheduling of routine cardiac CTs. Some insurance companies require a pre-authorization which may delay scheduling of this test.   For non-scheduling related questions, please contact the cardiac imaging nurse navigator should you have any questions/concerns: Marchia Bond, Cardiac Imaging Nurse Navigator Gordy Clement, Cardiac Imaging Nurse Navigator [facility] Direct Office Dial: [phone]   For scheduling needs, including cancellations and rescheduling, please call [place], [phone].     Follow-Up: At [facility], you and your health needs are our priority.  As part of our continuing mission to provide you with exceptional heart care, we have created designated Provider Care Teams.  These Care Teams include your primary Cardiologist (physician) and Advanced Practice Providers (APPs -  Physician Assistants and Nurse Practitioners) who all work together to provide you with the care you need, when you need it.  We recommend signing up for the patient portal called "MyChart".  Sign up information is provided on this After Visit Summary.  MyChart is used to connect with patients for Virtual Visits (Telemedicine).  Patients are able to view lab/test results, encounter notes, upcoming appointments, etc.  Non-urgent messages can be sent to your provider as well.   To learn more about what you can do with MyChart, go to [email or web address].    Your next appointment:   6-8 week(s)  The  format for your next appointment:   In Person  Provider:   You may see Kate Sable, MD or one of the following Advanced Practice Providers on your designated Care Team:   Murray Hodgkins, NP Christell Faith, PA-C Cadence Kathlen Mody, [place]   Other Instructions N/A

## 2022-01-19 LAB — BASIC METABOLIC PANEL
BUN/Creatinine Ratio: 16 (ref 9–23)
BUN: 14 mg/dL (ref 6–24)
CO2: 24 mmol/L (ref 20–29)
Calcium: 9 mg/dL (ref 8.7–10.2)
Chloride: 100 mmol/L (ref 96–106)
Creatinine, Ser: 0.87 mg/dL (ref 0.57–1.00)
Glucose: 187 mg/dL — ABNORMAL HIGH (ref 70–99)
Potassium: 4.3 mmol/L (ref 3.5–5.2)
Sodium: 138 mmol/L (ref 134–144)
eGFR: 79 mL/min/{1.73_m2} (ref >59)

## 2022-01-20 ENCOUNTER — Ambulatory Visit (INDEPENDENT_AMBULATORY_CARE_PROVIDER_SITE_OTHER): Payer: Medicaid Other | Admitting: Obstetrics and Gynecology

## 2022-01-20 ENCOUNTER — Encounter: Payer: Self-pay | Admitting: Obstetrics and Gynecology

## 2022-01-20 ENCOUNTER — Other Ambulatory Visit: Payer: Self-pay

## 2022-01-20 VITALS — BP 143/88 | HR 76 | Resp 16 | Ht 66.0 in | Wt 311.2 lb

## 2022-01-20 DIAGNOSIS — D251 Intramural leiomyoma of uterus: Secondary | ICD-10-CM

## 2022-01-20 DIAGNOSIS — Z6841 Body Mass Index (BMI) 40.0 and over, adult: Secondary | ICD-10-CM

## 2022-01-20 DIAGNOSIS — N924 Excessive bleeding in the premenopausal period: Secondary | ICD-10-CM

## 2022-01-20 DIAGNOSIS — G4733 Obstructive sleep apnea (adult) (pediatric): Secondary | ICD-10-CM | POA: Diagnosis not present

## 2022-01-20 DIAGNOSIS — I10 Essential (primary) hypertension: Secondary | ICD-10-CM

## 2022-01-20 NOTE — Progress Notes (Signed)
? ? ?GYNECOLOGY PROGRESS NOTE ? ?Subjective:  ? ? Patient ID: Brenda Hicks, female    DOB: [birth year], 56 y.o.   MRN: [id number] ? ?HPI ? Patient is a 56 y.o. [id number] female who presents for hysterectomy consultation. She desires to have a hysterectomy. She has a past history of morbid obesity, persistent perimenopausal menorrhagia, intramural fibroid, h/o sleep apnea, HTN, and type II DM.  She has had problems with her cycles since she was a teen. [place] birth of her last child and tubal ligation in 1991, that her cycles have progressively worsened. Sometimes uses pads, tampons, and diapers simultaneously to control her bleeding. She even notes sometimes having severe cramping without a cycle.  Cycles are often irregular and heavy. Patient has undergone the following interventions to date: used Depo Provera, IUD (1st one worked, second one with second one expelled). Also has undergone endometrial ablation last year. Last pap smear was 01/29/2019, normal. Denies h/o abnormal paps.  ? ? ?The following portions of the patient's history were reviewed and updated as appropriate: allergies, current medications, past family history, past medical history, past social history, past surgical history, and problem list. ? ?Review of Systems ?Pertinent items noted in HPI and remainder of comprehensive ROS otherwise negative.  ? ?Objective:  ? Blood pressure (!) 143/88, pulse 76, resp. rate 16, height 5\' 6"  (1.676 m), weight (!) 311 lb 3.2 oz (141.2 kg), last menstrual period 12/24/2021.  Body mass index is 50.23 kg/m?. ?General appearance: alert and no distress ?Abdomen: soft, non-tender; bowel sounds normal; no masses,  no organomegaly ?CVS: S1 and S2 present.  Regular rate and rhythm.  ?Pulm: Clear to auscultation.  ?Pelvic: deferred ?Extremities: extremities normal, atraumatic, no cyanosis or edema ?Neurologic: Grossly normal ? ? ?Labs:  ?Lab Results  ?Component Value Date  ? WBC 5.1 09/24/2021  ? HGB 13.9 09/24/2021  ?  HCT 44.2 09/24/2021  ? MCV 93.8 09/24/2021  ? PLT 225 09/24/2021  ? ? ? ?Imaging:  ?Patient Name: Brenda Hicks ?DOB: 26-Jul-1966 ?MRN: [id number] ?ULTRASOUND REPORT ?  ?Location: [facility] ?Date of Service: 01/14/2021  ?  ?  ?  ?Indications:AUB  ?  ?Findings:  ?The uterus is anteverted and measures 10.3 x 5.4 x 6.3 cm. ?Echo texture is homogenous with evidence of focal masses. ?Within the uterus is a solitary suspected fibroid measuring: ?Fibroid 1:Anterior IM 5.1 x 3.8 x 4.2 cm ?  ?The Endometrium measures 1.6 cm. ?  ?Right Ovary not seen ?Left Ovary not seen ?Survey of the adnexa demonstrates no adnexal masses. ?There is no free fluid in the cul de sac. ?  ?Impression: ?1. Thickened endometrium as mentioned above. ?2. Uterine fibroid as described above. ?3. Ovaries not seen bilaterally.  ?  ?Recommendations: ?1.Clinical correlation with the patient's History and Physical Exam. ?  ?  ?Jenine M. Albertine Grates    RDMS ?  ?  ?I have reviewed this study and agree with documented findings.  ?  ?  ?Rubie Maid, MD ?[facility] ?  ? ?Assessment:  ? ?1. Abnormal perimenopausal bleeding   ?2. Intramural leiomyoma of uterus   ?3. Morbid obesity with BMI of 50.0-59.9, adult ([place])   ?4. Obstructive sleep apnea syndrome   ?5. Essential hypertension   ?  ? ?Plan:  ? ?- Patient desires definitive management with hysterectomy.  I proposed doing a robotic-assisted laparoscopic total hysterectomy and prophylactic bilateral salpingectomy.  No  indication for oophorectomy at this time. Robotic assistance proposed secondary to  obesity, allowing for better agility and tissue manipulation, as well as maintaining a minimally invasive approach.  Patient agrees with this proposed surgery.  The risks of surgery were discussed in detail with the patient including but not limited to: bleeding which may require transfusion or reoperation; infection which may require antibiotics; injury to bowel, bladder, ureters or other  surrounding organs; need for additional procedures including laparotomy or subsequent procedures secondary to abnormal pathology; formation of adhesions; thromboembolic phenomenon; incisional problems and other postoperative/anesthesia complications.  Patient was also advised that she will remain in house for 1-2 nights; and expected recovery time after a hysterectomy is 6-8 weeks.  Patient was told that the likelihood that her condition and symptoms will be treated effectively with this surgical management was very high; the postoperative expectations were also discussed in detail. The patient also understands the alternative treatment options which were discussed in full. All questions were answered.  She was told that she will be contacted by our surgical scheduler regarding the time and date of her surgery; routine preoperative instructions will be given to her by the preoperative nursing team.   Routine postoperative instructions will be reviewed with the patient in detail after surgery.  bleeding precautions were reviewed. Printed patient education handouts about the procedure was given to the patient to review at home. Will tentatively plan for surgery in April.  ?- Patient with multiple comorbidities. HTN managed with anti-hypertensive. H/o sleep apnea, recently underwent sleep study several weeks ago, evaluated for CPAP.  Notes she has been working to improve her diabetes, current A1c is ~ 8, down from 9.1 last year. Discussed importance of good glucose control for promotion of wound healing. Will  return at the end of the month for pre-op. Has had EKG performed last week, was overall normal (non-specific T-wave noted).  ? ? ?A total of 30 minutes were spent face-to-face with the patient during this encounter and over half of that time dealt with chart review including last notes and pertinent labs/imaging,  counseling, coordination of care. ? ? ?Rubie Maid, MD ?[facility]  ?

## 2022-01-20 NOTE — Patient Instructions (Signed)
Robotic Assisted Hysterectomy  This surgical method provides a high-powered 3-D view of the operating area, permits an extensive range of motion that is more precise the human hand and allows use of surgical instruments from angles and positions that would be difficult to otherwise achieve.  OVERVIEW What is robotic assisted hysterectomy? Robotic assisted hysterectomy is a type of surgery that uses surgeon-controlled robotic equipment to remove your uterus.  Hysterectomy is the surgical removal of the uterus. The uterus is a hollow, muscular organ located in a woman's pelvis. Having a hysterectomy ends menstruation and the ability to become pregnant. Depending on the reason for the surgery, a hysterectomy may also involve the removal of other organs and tissues, such as the ovaries and/or fallopian tubes.  A robotic assisted surgery system consists of two separate pieces of equipment. One robotic piece of equipment is located next to the patient in the operating room. This robotic piece has four arms, which are long thin tubes that are attached to either a thin surgical instrument or a tiny camera. The surgical instruments and camera enter the patient's body through small  inch cuts (incisions) in the abdomen.  A short distance away from the operating table, the surgeon is seated in front of a separate computerized piece of equipment that looks like a video game. The surgeon controls the movements of the robotic arms and instruments with hand-held controls. The surgeon looks through binocular-like lenses on the equipment and a computer generates a three-dimensional view of the operating area. Foot pedals control the camera and allow the surgeon to zoom in or out to change the surgical view.  In what ways does robotic assisted surgery help the surgeon? The robotic assisted surgery is a computer-enhanced surgical system that gives surgeons the advantages of:  A 3-D view of the surgical field,  including depth, up to 15 times the magnification and high resolution Instruments that mimic the movement of the human hands, wrists and fingers, allowing an extensive range of motion that is more precise than the surgeon's natural hand and wrist movements A constant steadiness of the robot arms and instruments and robot wrists that make it easier for surgeons to operate on organs and tissues for long periods of time and from angles and positions they would have difficulty reaching with human hands and fingers The surgeon controls every precise movement of the robotic arms and instruments. The robotic arms cannot move on their own.  Who may benefit from robotic-assisted hysterectomy? Robotic assisted hysterectomy may be especially helpful in:  Patients who are obese Patients who have endometrial cancer Patients who have complex surgical cases, such as advanced stage endometriosis or pelvic adhesive disease (scar tissue that binds nearby organs together) You and your surgeon will discuss if robotic assisted surgery is possible and appropriate for your specific condition.  PROCEDURE DETAILS To perform robotic-assisted surgery, a surgeon is seated in front of a computerized console that provides a high-powered view of the operating area. Movement of robotic arms attached to surgical instruments is controlled by the surgeon seated at the console a few feet away. Typical operating room setup for robotic assisted surgery. The surgeon is seated seated in front of a computerized console that provides a high-powered, 3-D view of the operating area. The surgical instruments, attached to robotic arms, are controlled by the surgeon seated at the console. What happens before and during robotic assisted hysterectomy? Before the procedure  Before your surgery, your doctor will perform a physical exam, order blood   and urine tests and may order other tests to check your general health. Your doctor will tell you  which of your current medications can continue to be taken and which will need to be temporarily stopped before surgery. You will be given instructions on when to stop eating and drinking the evening before and morning of your surgery. Your surgeon will explain the procedure in detail, including possible complications and side effects. He or she will also answer your questions.  On the day of surgery:  A urinary catheter may be inserted to empty your bladder Your abdominal area will be cleaned with a sterile solution An intravenous (IV) line will be placed in a vein in your arm to deliver medications and fluids During the procedure  After receiving anesthesia, your surgeon will make four or five small surgical cuts (incisions) in your abdomen (belly). The thin surgical instruments and tiny lighted camera attached to the arms of the surgical robot are inserted into the abdomen through these incisions.  The surgeon controls the precise movement of the robotic arms, surgical instruments and camera while seated at a computer console. Members of the surgical team stand next to the operating table to change the robotic instruments and provide other assistance to the surgeon as needed.  Your surgeon typically removes the uterus through the vagina, like when delivering a baby. In certain cases, the uterus is removed through the small incisions in your abdomen.  An anesthesiologist monitors your anesthesia and vital signs throughout your operation.  How long does robotic assisted hysterectomy take to complete? Robotic assisted hysterectomy typically takes between one to four hours to complete, depending upon the surgeon and the complexity of the case.  What's the typical recovery time with robotic assisted hysterectomy? Robotic hysterectomy is an outpatient procedure. You may stay in the hospital overnight but some woman can be released the same day of surgery. You will able return to light regular  activities the next day (walking, eating, walking up stairs). You can drive in about a week or less at the discretion of your physician and return to exercising in about four to six weeks. Your doctor will review your progress and tell you when you can return to your normal activities.  RISKS / BENEFITS What are the advantages of robot-assisted surgery for patients compared with traditional open surgery? Compared with traditional open surgery, the benefits of robotic assisted surgery may include:  Less blood loss during surgery Smaller incisions with less scarring. Surgery is performed through small incisions instead of the large incision of open surgery Less post-op pain Decreased risk of infection Shorter hospital stay Shorter recovery time and quicker return to previous activities. You can usually resume normal activities as soon as you feel up to it. What are the risks of robotic hysterectomy? Robot-assisted hysterectomy takes more time compared to other hysterectomy methods, such as traditional open hysterectomy performed by a surgeon. Longer surgeries may increase your risk for complications.  Like any surgical procedure, robotic hysterectomy carries risks including:  Bleeding Damage to the bladder and other nearby organs Infection Reaction to anesthesia Blood clots that form in the legs and can travel to your lungs  RECOVERY AND OUTLOOK What is the prognosis (outlook) for people who have robotic hysterectomy? Most women recover from robotic hysterectomy in less time and with less pain compared to traditional, open hysterectomies. Because the incisions are small, people can return to their daily activities more quickly. With the exception of hysterectomy for cervical cancer, outcomes   with robotic surgery are as good as open surgery with shorter recovery. Robotic surgery is NOT recommended for hysterectomies done for cervical cancer as cancer-related outcomes are significantly  worse.  

## 2022-01-21 NOTE — Addendum Note (Signed)
Addended by: [name] on: 01/21/2022 11:09 AM ? ? Modules accepted: Orders ? ?

## 2022-01-25 IMAGING — MR MR SHOULDER*R* WO/W CM
9 series · 37 of 40 positions shown · IV contrast (gadavist)
Comparison: MRI right shoulder 06/04/2019.

CLINICAL DATA: Chronic right shoulder pain and decreased range of
motion. History of prior surgery.

EXAM:
MRI OF THE RIGHT SHOULDER WITHOUT AND WITH CONTRAST
TECHNIQUE: Multiplanar, multisequence MR imaging of the right shoulder was
performed before and after the administration of intravenous
contrast.
CONTRAST:  10 mL GADAVIST IV SOLN

[Series 13: T2 fat-sat · axial · right · 4.0mm · 0.44mm/px · z∈[-129,-6]mm · 4 of 28 slices shown (1 of 4)]
[im 1/28]
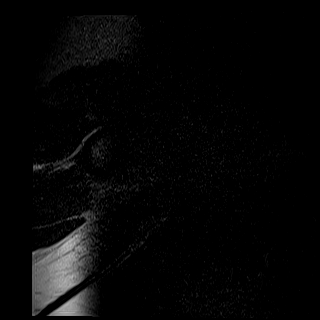
[im 10/28]
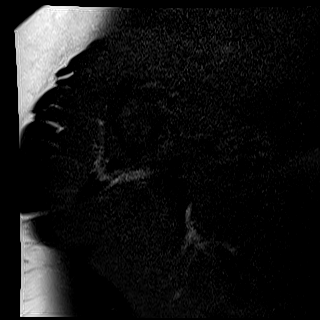
[im 19/28]
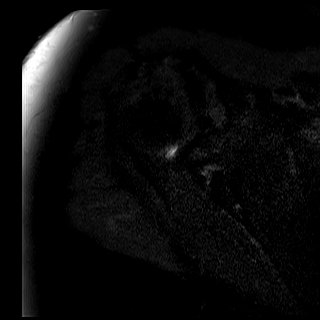
[im 28/28]
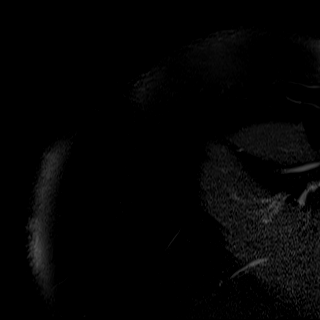

[Series 14: PD · oblique · right · 4.0mm · 0.44mm/px · 4 of 26 slices shown]
[im 1/26]
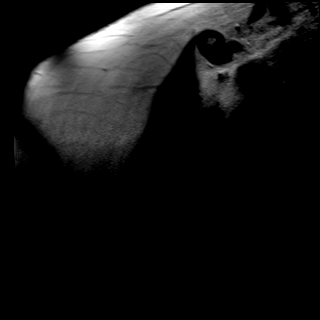
[im 9/26]
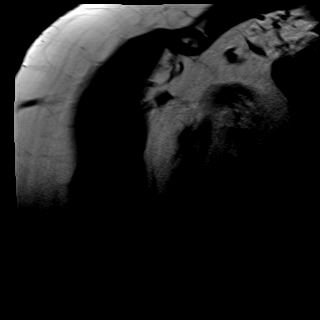
[im 17/26]
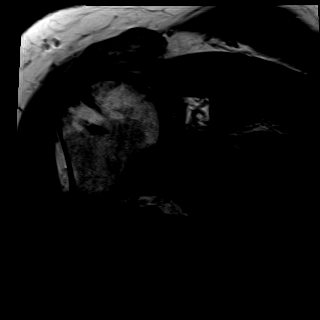
[im 26/26]
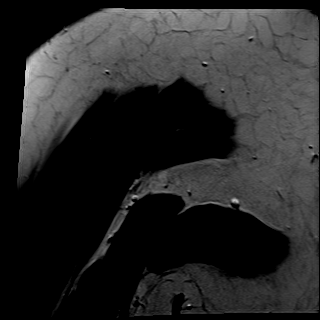

[Series 15: T2 fat-sat · oblique · right · 4.0mm · 0.44mm/px · 5 of 26 slices shown (2 of 4)]
[im 1/26]
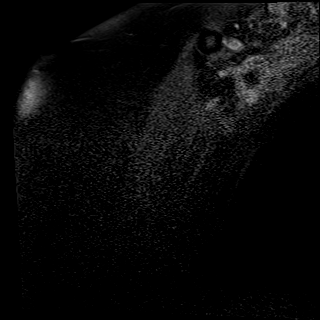
[im 7/26]
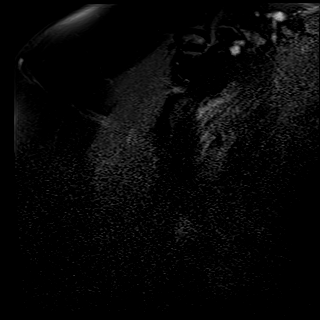
[im 13/26]
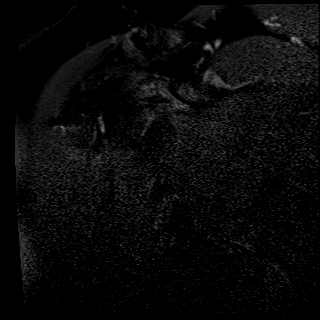
[im 19/26]
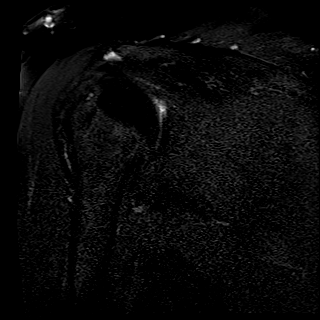
[im 26/26]
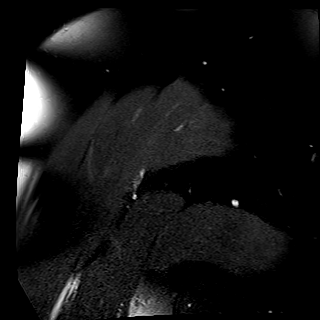

[Series 16: T2 fat-sat · oblique · right · 4.0mm · 0.22mm/px · 4 of 22 slices shown (3 of 4)]
[im 1/22]
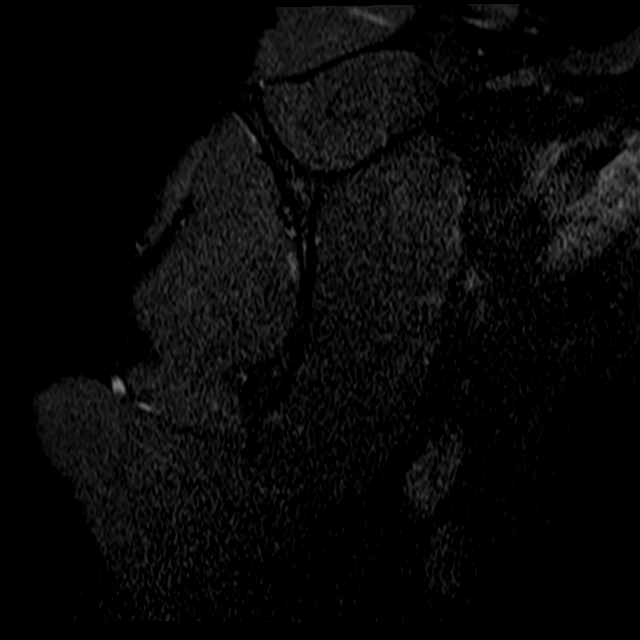
[im 8/22]
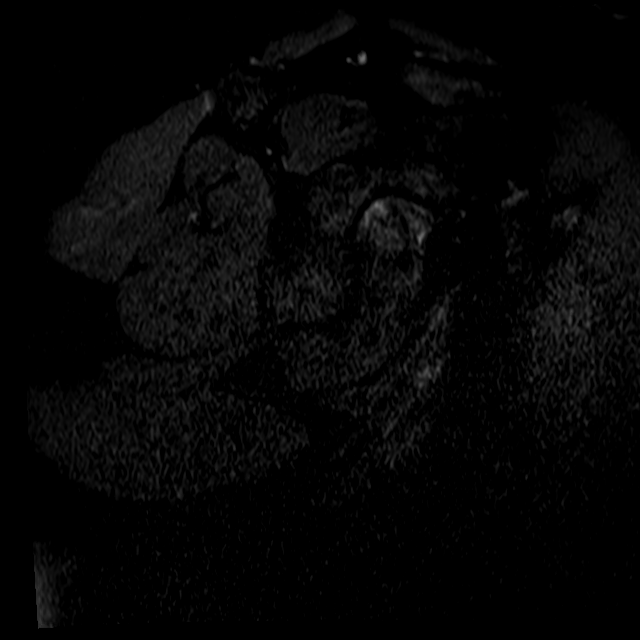
[im 15/22]
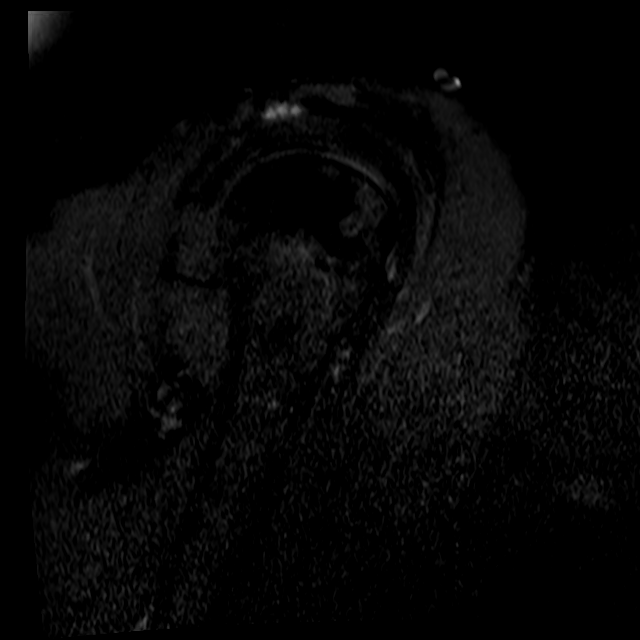
[im 22/22]
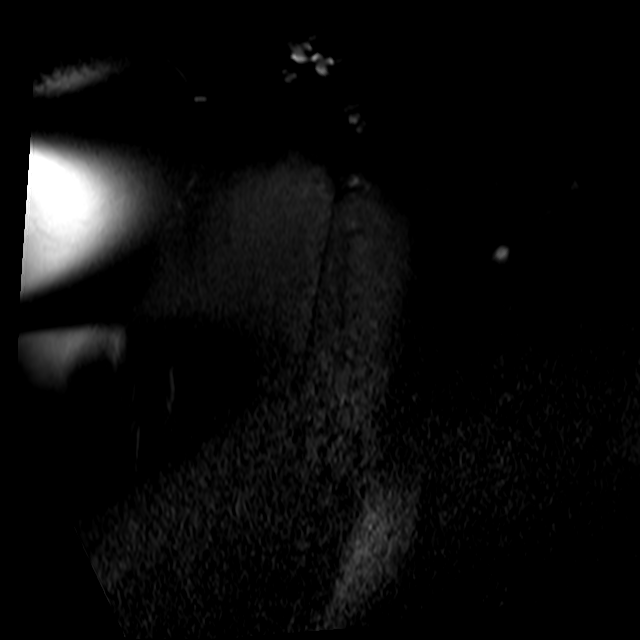

[Series 17: T1 · oblique · right · 4.0mm · 0.44mm/px · 4 of 22 slices shown]
[im 1/22]
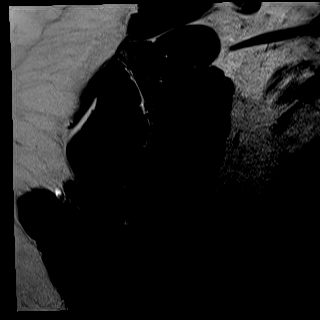
[im 8/22]
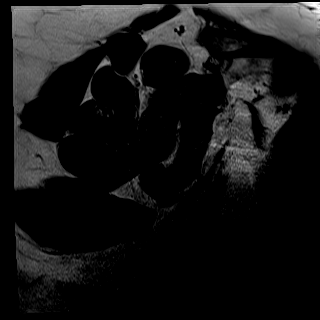
[im 15/22]
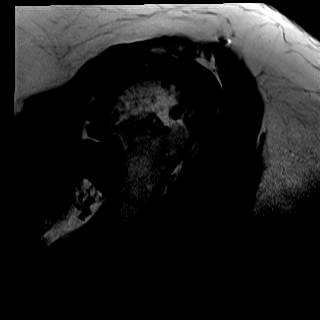
[im 22/22]
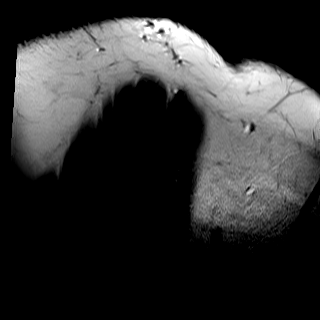

[Series 18: T2 fat-sat · axial · right · 4.0mm · 0.55mm/px · z∈[-123,-13]mm · 4 of 25 slices shown (4 of 4)]
[im 1/25]
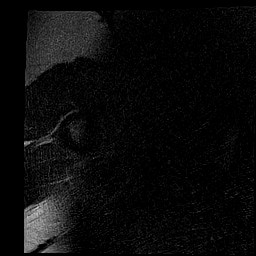
[im 9/25]
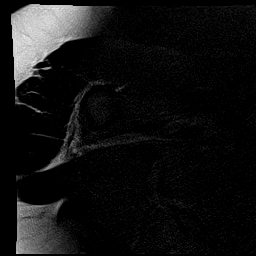
[im 17/25]
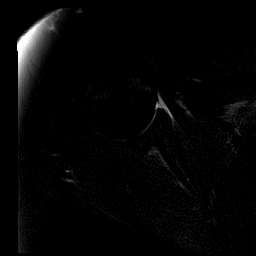
[im 25/25]
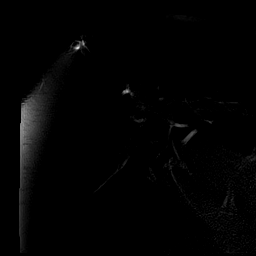

[Series 19: T1 fat-sat · axial · non-contrast · right · 4.0mm · 0.55mm/px · z∈[-127,-8]mm · 5 of 27 slices shown]
[im 1/27]
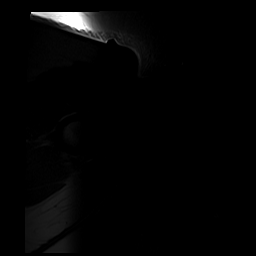
[im 7/27]
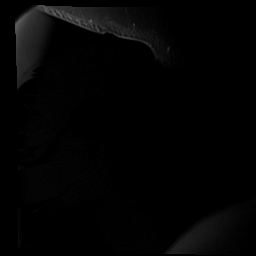
[im 14/27]
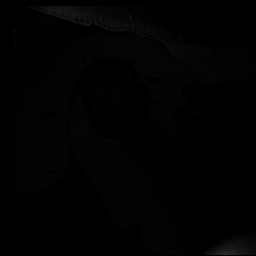
[im 20/27]
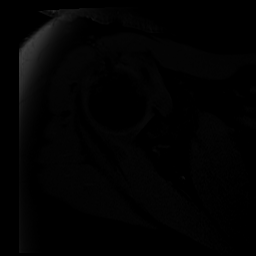
[im 27/27]
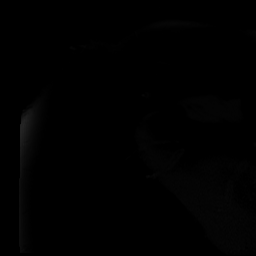

[Series 20: T1 fat-sat post-contrast · axial · right · 4.0mm · 0.55mm/px · z∈[-127,-8]mm · 5 of 27 slices shown (1 of 2)]
[im 1/27]
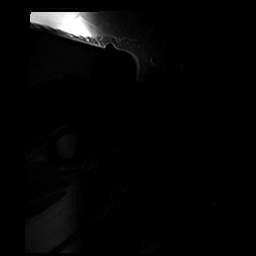
[im 7/27]
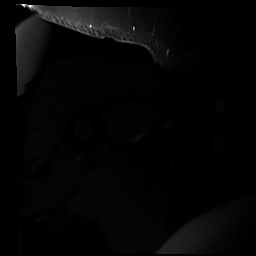
[im 14/27]
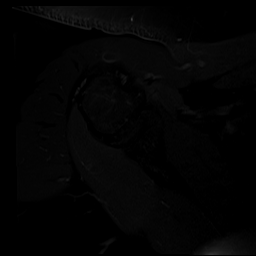
[im 20/27]
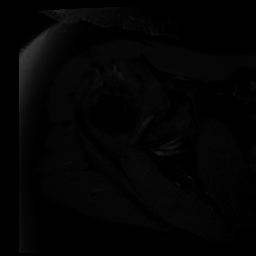
[im 27/27]
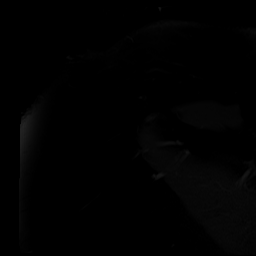

[Series 21: T1 fat-sat post-contrast · oblique · right · 4.0mm · 0.27mm/px · 2 of 26 slices shown (2 of 2)]
[im 1/26]
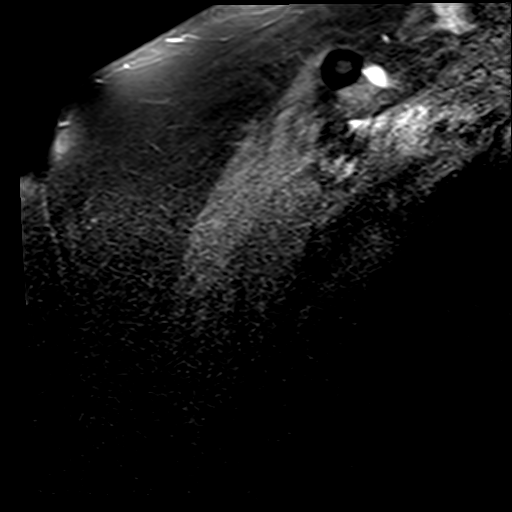
[im 7/26]
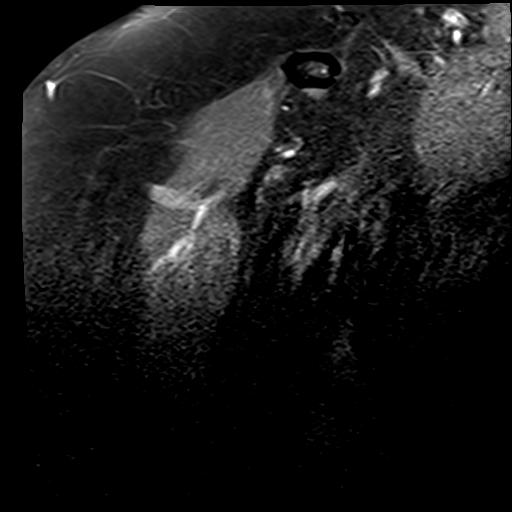

[37 of 40 positions shown; findings below may reference images not displayed]

FINDINGS: Rotator cuff: The patient is status post rotator cuff repair. The
cuff is intact. Mild edema in the supraspinatus tendon has decreased
since the prior exam.

Muscles:  No atrophy or focal lesion.

Biceps long head:  Biceps tenodesis is unchanged.

Acromioclavicular Joint: Status post debridement as on the prior
exam. No complicating feature. Type 2 acromion. No
subacromial/subdeltoid bursal fluid. Status post acromioplasty.

Glenohumeral Joint: Appears normal.

Labrum:  Appears intact.

Bones:  No fracture or worrisome lesion.

Other: None.
IMPRESSION: Postoperative change of the shoulder as described above without
complicating feature. Mild supraspinatus tendinosis has improved
since the prior exam. Negative for rotator cuff tear or new
abnormality since the prior study.

## 2022-01-25 IMAGING — MR MR CERVICAL SPINE WO/W CM
5 of 8 series · 29 of 48 positions shown · IV contrast (gadavist)
Comparison: Previous MRI from 12/14/2020.

CLINICAL DATA: Initial evaluation for right-sided neck, shoulder,
and arm pain, with right hand numbness. History of prior surgery.

EXAM:
MRI CERVICAL SPINE WITHOUT AND WITH CONTRAST
TECHNIQUE: Multiplanar and multiecho pulse sequences of the cervical spine, to
include the craniocervical junction and cervicothoracic junction,
were obtained without and with intravenous contrast.
CONTRAST:  10mL GADAVIST GADOBUTROL 1 MMOL/ML IV SOLN

[Series 5: T2 · sagittal · 3.0mm · 0.62mm/px · 4 of 15 slices shown (1 of 2)]
[im 1/15]
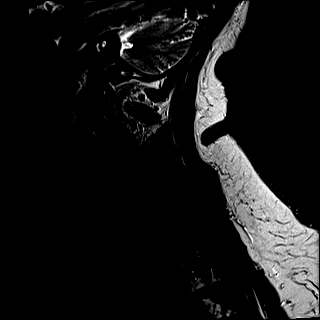
[im 5/15]
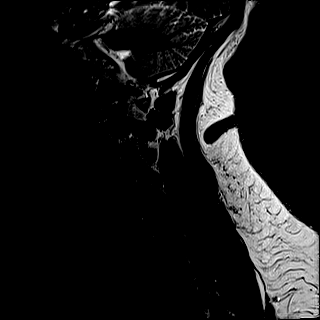
[im 10/15]
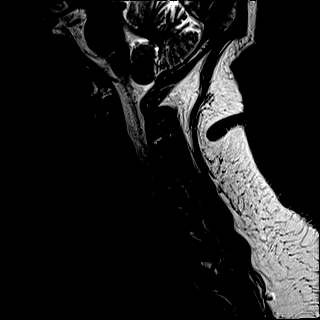
[im 15/15]
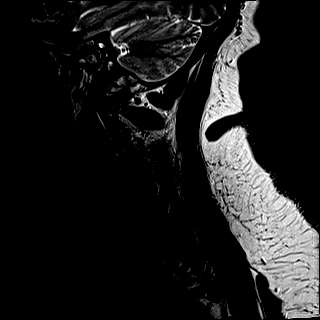

[Series 7: STIR · sagittal · 3.0mm · 0.62mm/px · 4 of 15 slices shown]
[im 1/15]
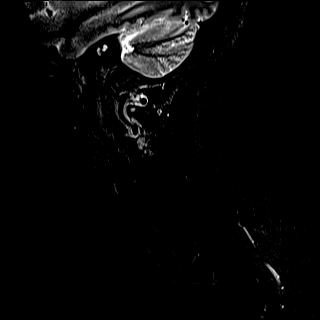
[im 5/15]
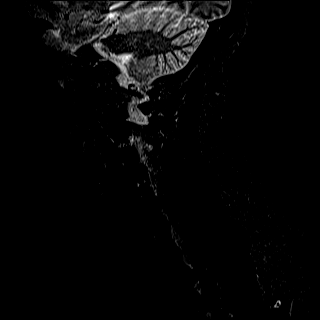
[im 10/15]
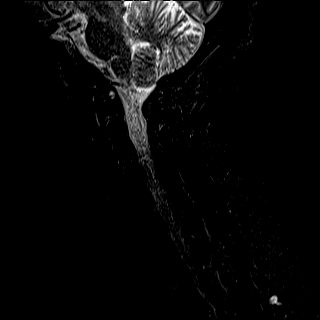
[im 15/15]
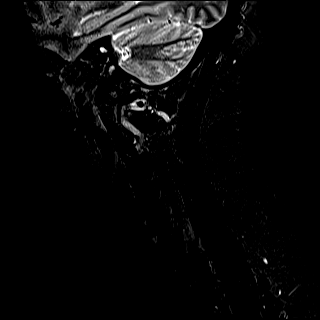

[Series 8: T2 · axial · 3.0mm · 0.70mm/px · z∈[-157,-67]mm · 8 of 29 slices shown (2 of 2)]
[im 1/29]
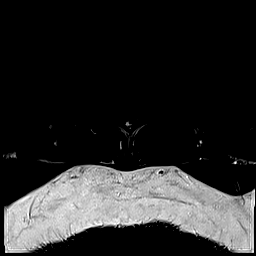
[im 5/29]
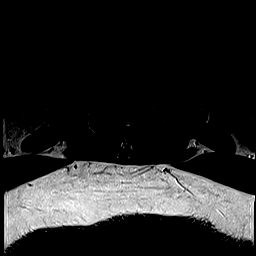
[im 9/29]
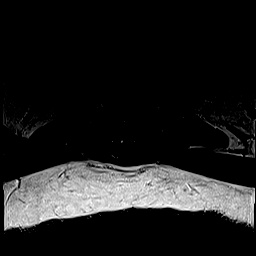
[im 13/29]
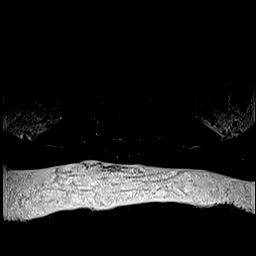
[im 17/29]
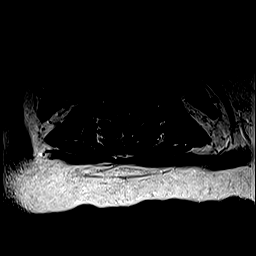
[im 21/29]
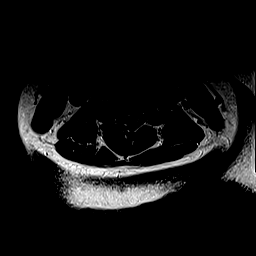
[im 25/29]
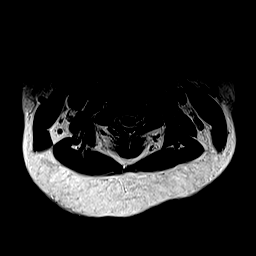
[im 29/29]
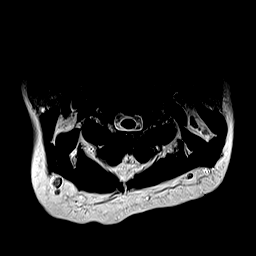

[Series 10: T1 · axial · non-contrast · 3.0mm · 0.35mm/px · z∈[-157,-67]mm · 8 of 29 slices shown]
[im 1/29]
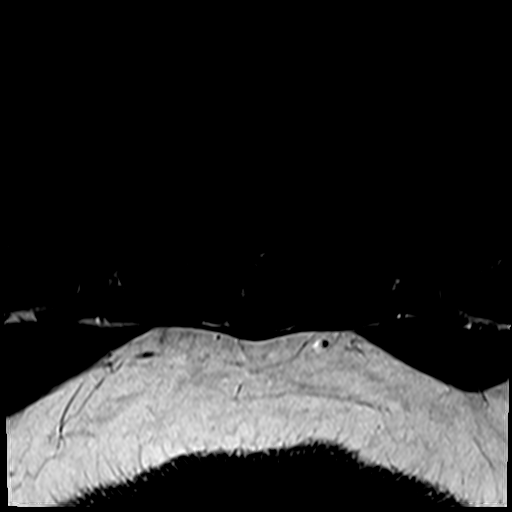
[im 5/29]
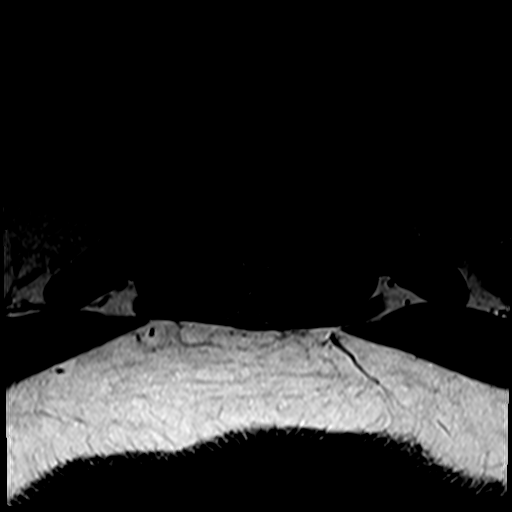
[im 9/29]
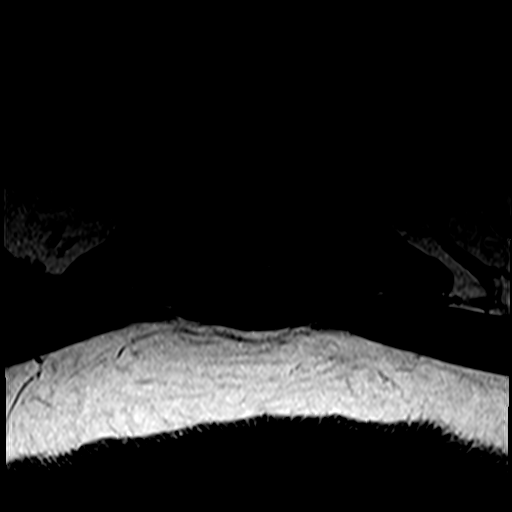
[im 13/29]
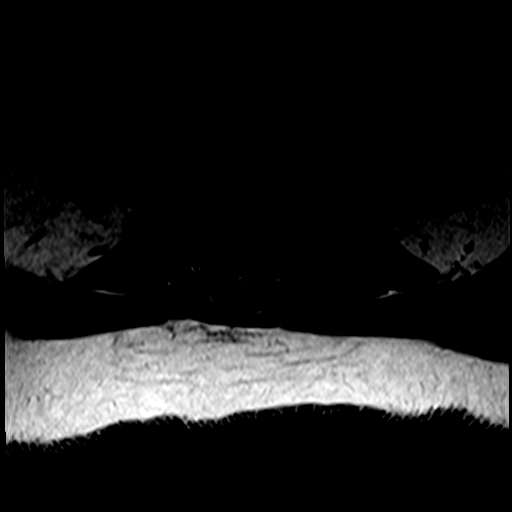
[im 17/29]
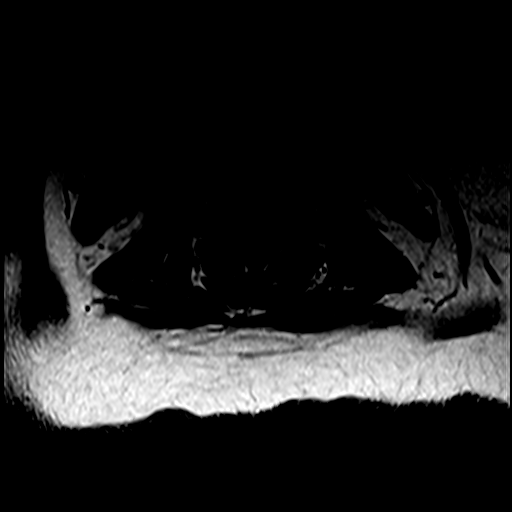
[im 21/29]
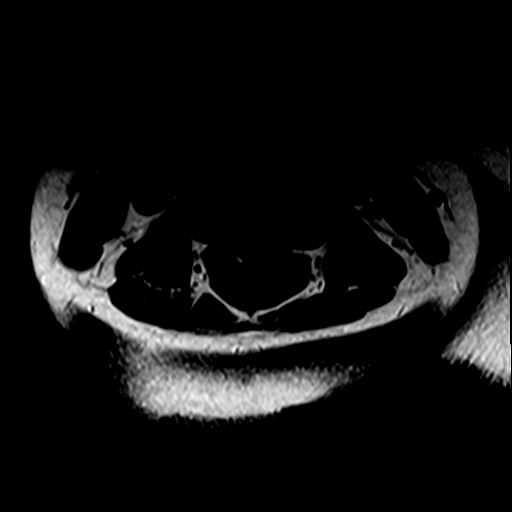
[im 25/29]
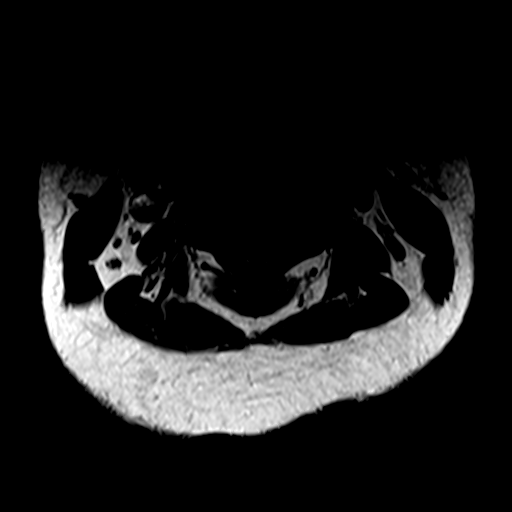
[im 29/29]
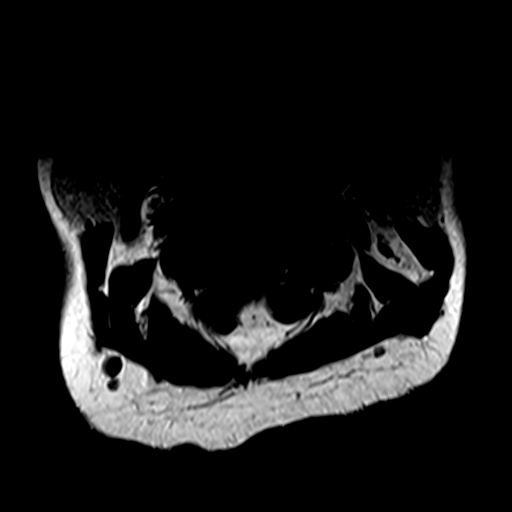

[Series 20: T1 post-contrast · axial · 3.0mm · 0.35mm/px · z∈[-121,-70]mm · 5 of 28 slices shown]
[im 1/28]
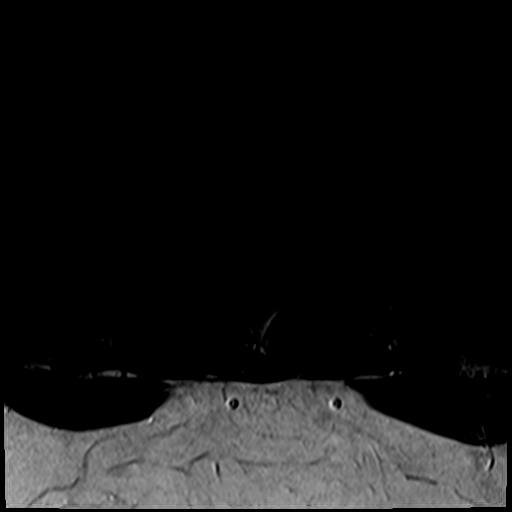
[im 4/28]
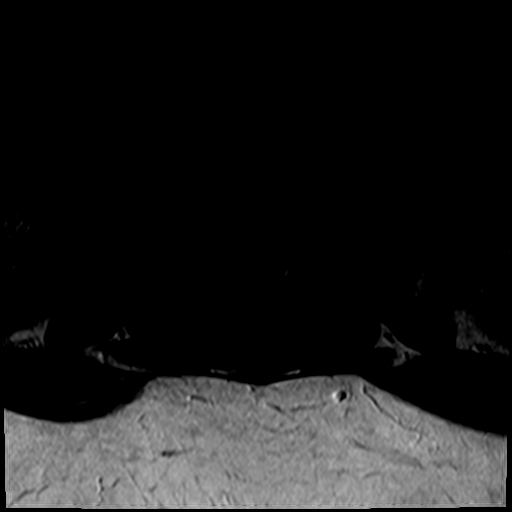
[im 8/28]
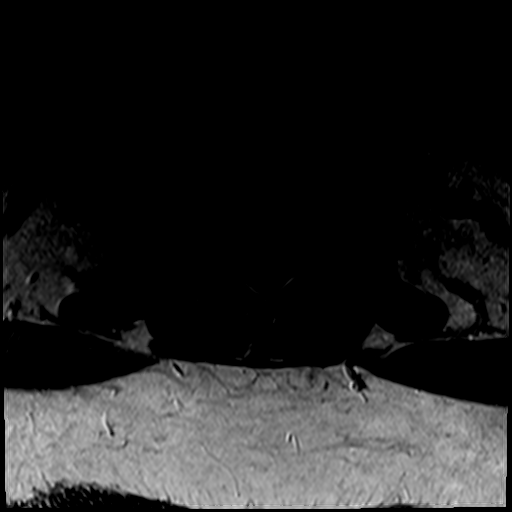
[im 12/28]
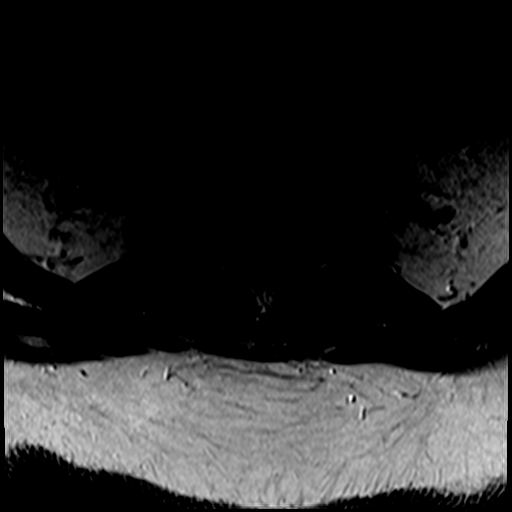
[im 16/28]
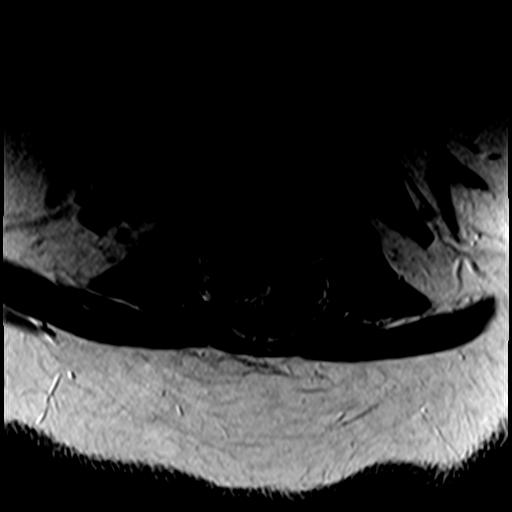

[29 of 48 positions shown; findings below may reference images not displayed]

FINDINGS: Alignment: Straightening of the normal cervical lordosis. No
listhesis.

Vertebrae: Susceptibility artifact related to prior ACDF at C4-5.
Vertebral body height maintained without acute or interval fracture.
Bone marrow signal intensity within normal limits. No discrete or
worrisome osseous lesions. No abnormal marrow edema or enhancement.

Cord: Normal signal and morphology.  No abnormal enhancement.

Posterior Fossa, vertebral arteries, paraspinal tissues: Empty sella
noted. Visualized brain and posterior fossa otherwise unremarkable.
Craniocervical junction within normal limits. Paraspinous and
prevertebral soft tissues within normal limits. Previously seen
retropharyngeal edema not seen on today's exam. Normal flow voids
seen within the vertebral arteries bilaterally.

Disc levels:

C2-C3: Unremarkable.

C3-C4: Mild disc bulge with uncovertebral hypertrophy, greater on
the left. No spinal stenosis. Mild to moderate left with mild right
C4 foraminal stenosis. Appearance is stable.

C4-C5:  Prior fusion.  No residual canal or foraminal stenosis.

C5-C6:  Minimal annular disc bulge.  No stenosis.

C6-C7:  Unremarkable.

C7-T1:  Unremarkable.

Visualized upper thoracic spine demonstrates no significant finding.
IMPRESSION: 1. Prior ACDF at C4-5 without residual or recurrent stenosis.
2. Mild disc bulge with uncovertebral hypertrophy at C3-4 with
resultant mild to moderate left and mild right C4 foraminal
stenosis, stable.
3. No other significant disc pathology or stenosis. No evidence for
neural impingement.

## 2022-01-26 ENCOUNTER — Telehealth (HOSPITAL_COMMUNITY): Payer: Self-pay | Admitting: *Deleted

## 2022-01-26 MED ORDER — METOPROLOL TARTRATE 100 MG PO TABS: ORAL_TABLET | ORAL | 0 refills | Status: DC

## 2022-01-26 NOTE — Telephone Encounter (Signed)
Reaching out to patient to offer assistance regarding upcoming cardiac imaging study; pt verbalizes understanding of appt date/time, parking situation and where to check in, pre-test NPO status and medications ordered, and verified current allergies; name and call back number provided for further questions should they arise ? ?Jakyrah Holladay RN Navigator Cardiac Imaging ?[facility] ?[phone] office ?[phone] cell ? ?Patient to take 100mg metoprolol tartrate two hours prior to her cardiac CT scan.  ?

## 2022-01-28 ENCOUNTER — Ambulatory Visit
Admission: RE | Admit: 2022-01-28 | Discharge: 2022-01-28 | Disposition: A | Discharge status: Home / Self Care | Payer: Medicaid Other | Source: Ambulatory Visit | Attending: Medical | Admitting: Medical

## 2022-01-28 ENCOUNTER — Other Ambulatory Visit: Payer: Self-pay

## 2022-01-28 DIAGNOSIS — R072 Precordial pain: Secondary | ICD-10-CM

## 2022-01-28 MED ORDER — NITROGLYCERIN 0.4 MG SL SUBL
0.8000 mg | SUBLINGUAL_TABLET | Freq: Once | SUBLINGUAL | Status: AC
  Administered 2022-01-28: 09:00:00 0.8 mg via SUBLINGUAL

## 2022-01-28 MED ORDER — IOHEXOL 350 MG/ML SOLN
125.0000 mL | Freq: Once | INTRAVENOUS | Status: AC | PRN
  Administered 2022-01-28: 08:00:00 100 mL via INTRAVENOUS

## 2022-01-28 MED ORDER — METOPROLOL TARTRATE 5 MG/5ML IV SOLN
10.0000 mg | Freq: Once | INTRAVENOUS | Status: AC
  Administered 2022-01-28: 09:00:00 10 mg via INTRAVENOUS

## 2022-01-28 NOTE — Progress Notes (Signed)
Patient tolerated procedure well. Ambulate w/o difficulty. Denies light headedness or being dizzy. Sitting in chair drinking water provided. Encouraged to drink extra water today and reasoning explained. Verbalized understanding. All questions answered. ABC intact. No further needs. Discharge from procedure area w/o issues.   °

## 2022-02-01 ENCOUNTER — Telehealth: Payer: Self-pay | Admitting: *Deleted

## 2022-02-01 NOTE — Telephone Encounter (Signed)
Left voicemail message to call back for review of results.  

## 2022-02-01 NOTE — Telephone Encounter (Signed)
-----   Message from [place], PA-C sent at 01/31/2022  9:11 PM EDT ----- ?Coronary CTA showed calcium score of 0 with no evidence of coronary artery disease. Overall reassuring for non-cardiac chest pain. ?
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
## 2022-02-04 NOTE — Telephone Encounter (Signed)
Attempted to call patient to go over results. No answer. [email or web address] ?

## 2022-02-05 ENCOUNTER — Encounter: Payer: Self-pay | Admitting: Emergency Medicine

## 2022-02-05 NOTE — Progress Notes (Signed)
Results letter. Attempted 3 times to get in contact with patient, unable, patient did not return phone calls.  ?

## 2022-02-05 NOTE — Telephone Encounter (Signed)
Attempted to call patient to go over results. No answer, left message. Will send letter with results.  ?

## 2022-02-08 ENCOUNTER — Encounter: Payer: Medicaid Other | Admitting: Certified Nurse Midwife

## 2022-02-15 ENCOUNTER — Other Ambulatory Visit: Payer: Self-pay

## 2022-02-15 DIAGNOSIS — G4730 Sleep apnea, unspecified: Secondary | ICD-10-CM

## 2022-02-18 ENCOUNTER — Other Ambulatory Visit: Payer: Self-pay | Admitting: Physical Medicine & Rehabilitation

## 2022-02-18 DIAGNOSIS — M5412 Radiculopathy, cervical region: Secondary | ICD-10-CM

## 2022-02-20 ENCOUNTER — Other Ambulatory Visit: Payer: Self-pay | Admitting: Internal Medicine

## 2022-02-20 DIAGNOSIS — M5412 Radiculopathy, cervical region: Secondary | ICD-10-CM

## 2022-02-22 MED ORDER — TIZANIDINE HCL 4 MG PO TABS: 4.0000 mg | ORAL_TABLET | Freq: Four times a day (QID) | ORAL | 0 refills | Status: DC | PRN

## 2022-02-23 ENCOUNTER — Inpatient Hospital Stay
Admission: RE | Admit: 2022-02-23 | Discharge: 2022-02-23 | Disposition: A | Discharge status: Home / Self Care | Payer: Medicaid Other | Source: Ambulatory Visit | Attending: Physical Medicine & Rehabilitation | Admitting: Physical Medicine & Rehabilitation

## 2022-02-23 NOTE — Discharge Instructions (Signed)

## 2022-03-08 ENCOUNTER — Other Ambulatory Visit: Payer: Self-pay | Admitting: Obstetrics and Gynecology

## 2022-03-08 ENCOUNTER — Other Ambulatory Visit: Payer: Self-pay

## 2022-03-08 ENCOUNTER — Encounter
Admission: RE | Admit: 2022-03-08 | Discharge: 2022-03-08 | Disposition: A | Discharge status: Home / Self Care | Payer: Medicaid Other | Source: Ambulatory Visit | Attending: Obstetrics and Gynecology | Admitting: Obstetrics and Gynecology

## 2022-03-08 DIAGNOSIS — Z01818 Encounter for other preprocedural examination: Secondary | ICD-10-CM

## 2022-03-08 NOTE — Patient Instructions (Addendum)
Your procedure is scheduled on: 03/15/22 Report to [place]. To find out your arrival time please call [phone] between 1PM - 3PM on 03/14/22.  Remember: Instructions that are not followed completely may result in serious medical risk, up to and including death, or upon the discretion of your surgeon and anesthesiologist your surgery may need to be rescheduled.     _X__ 1. Do not eat food after midnight the night before your procedure.                 No gum chewing or hard candies. You may drink clear liquids up to 2 hours                 before you are scheduled to arrive for your surgery- DO not drink clear                 liquids within 2 hours of the start of your surgery.                 Clear Liquids include:  Marland Kitchen Diabetics water only  Drink the G2 Gatorade 2 hours before arriving to surgery  __X__2.  On the morning of surgery brush your teeth with toothpaste and water, you                 may rinse your mouth with mouthwash if you wish.  Do not swallow any              toothpaste of mouthwash.     _X__ 3.  No Alcohol for 24 hours before or after surgery.   _X__ 4.  Do Not Smoke or use e-cigarettes For 24 Hours Prior to Your Surgery.                 Do not use any chewable tobacco products for at least 6 hours prior to                 surgery.  ____  5.  Bring all medications with you on the day of surgery if instructed.   __X__  6.  Notify your doctor if there is any change in your medical condition      (cold, fever, infections).     Do not wear jewelry, make-up, hairpins, clips or nail polish. Do not wear lotions, powders, or perfumes. You may wear deodorant Do not shave body hair 48 hours prior to surgery. Men may shave face and neck. Do not bring valuables to the hospital.    [facility] is not responsible for any belongings or valuables.  Contacts, dentures/partials or body piercings may not be worn into  surgery. Bring a case for your contacts, glasses or hearing aids, a denture cup will be supplied. Leave your suitcase in the car. After surgery it may be brought to your room. For patients admitted to the hospital, discharge time is determined by your treatment team.   Patients discharged the day of surgery will not be allowed to drive home.   Please read over the following fact sheets that you were given:   MRSA Information, CHG soap  __X__ Take these medicines the morning of surgery with A SIP OF WATER:    1. gabapentin (NEURONTIN) 300 MG capsule  2.   3.   4.  5.  6.  ____ Fleet Enema (as directed)   __X__ Use CHG Soap/SAGE wipes as directed  __X__ Use inhalers  on the day of surgery  ____ Stop metformin/Janumet/Farxiga 2 days prior to surgery    __X__ Take 1/2 of usual insulin dose the night before surgery. No insulin the morning          of surgery. DECREASE LANTUS TO 50 UNITS AT BEDTIME THE NIGHT BEFORE SURGERY ONLY  ____ Stop Blood Thinners Coumadin/Plavix/Xarelto/Pleta/Pradaxa/Eliquis/Effient/Aspirin  on   Or contact your Surgeon, Cardiologist or Medical Doctor regarding  ability to stop your blood thinners  __X__ Stop Anti-inflammatories 7 days before surgery such as Advil, Ibuprofen, Motrin,  BC or Goodies Powder, Naprosyn, Naproxen, Aleve, Aspirin   YOU MAY TAKE TYLENOL IF NEEDED  __X__ Stop all herbals and supplements, fish oil or vitamins  until after surgery.    __X__ Bring C-Pap to the hospital. BRING CPAP IF AVAILABLE

## 2022-03-09 ENCOUNTER — Other Ambulatory Visit
Admission: RE | Admit: 2022-03-09 | Discharge: 2022-03-09 | Disposition: A | Discharge status: Home / Self Care | Payer: Medicaid Other | Source: Ambulatory Visit | Attending: Obstetrics and Gynecology | Admitting: Obstetrics and Gynecology

## 2022-03-09 DIAGNOSIS — Z01818 Encounter for other preprocedural examination: Secondary | ICD-10-CM

## 2022-03-15 ENCOUNTER — Encounter
Admission: RE | Disposition: A | Discharge status: Home / Self Care | Payer: Self-pay | Source: Home / Self Care | Attending: Obstetrics and Gynecology

## 2022-03-15 ENCOUNTER — Observation Stay
Admission: RE | Admit: 2022-03-15 | Discharge: 2022-03-16 | Disposition: A | Discharge status: Home / Self Care | Payer: Medicaid Other | Attending: Obstetrics and Gynecology | Admitting: Obstetrics and Gynecology

## 2022-03-15 ENCOUNTER — Inpatient Hospital Stay: Payer: Medicaid Other | Admitting: Anesthesiology

## 2022-03-15 ENCOUNTER — Other Ambulatory Visit: Payer: Self-pay

## 2022-03-15 ENCOUNTER — Ambulatory Visit: Payer: Medicaid Other | Admitting: Medical

## 2022-03-15 ENCOUNTER — Encounter: Payer: Self-pay | Admitting: Obstetrics and Gynecology

## 2022-03-15 DIAGNOSIS — Z6841 Body Mass Index (BMI) 40.0 and over, adult: Secondary | ICD-10-CM

## 2022-03-15 DIAGNOSIS — Z794 Long term (current) use of insulin: Secondary | ICD-10-CM | POA: Insufficient documentation

## 2022-03-15 DIAGNOSIS — D251 Intramural leiomyoma of uterus: Secondary | ICD-10-CM | POA: Diagnosis not present

## 2022-03-15 DIAGNOSIS — Z8673 Personal history of transient ischemic attack (TIA), and cerebral infarction without residual deficits: Secondary | ICD-10-CM | POA: Insufficient documentation

## 2022-03-15 DIAGNOSIS — Z87891 Personal history of nicotine dependence: Secondary | ICD-10-CM | POA: Insufficient documentation

## 2022-03-15 DIAGNOSIS — E119 Type 2 diabetes mellitus without complications: Secondary | ICD-10-CM

## 2022-03-15 DIAGNOSIS — J449 Chronic obstructive pulmonary disease, unspecified: Secondary | ICD-10-CM | POA: Insufficient documentation

## 2022-03-15 DIAGNOSIS — Z85828 Personal history of other malignant neoplasm of skin: Secondary | ICD-10-CM | POA: Insufficient documentation

## 2022-03-15 DIAGNOSIS — Z90710 Acquired absence of both cervix and uterus: Secondary | ICD-10-CM

## 2022-03-15 DIAGNOSIS — J45909 Unspecified asthma, uncomplicated: Secondary | ICD-10-CM | POA: Diagnosis not present

## 2022-03-15 DIAGNOSIS — Z96651 Presence of right artificial knee joint: Secondary | ICD-10-CM | POA: Insufficient documentation

## 2022-03-15 DIAGNOSIS — N924 Excessive bleeding in the premenopausal period: Secondary | ICD-10-CM

## 2022-03-15 DIAGNOSIS — I10 Essential (primary) hypertension: Secondary | ICD-10-CM

## 2022-03-15 DIAGNOSIS — Z01818 Encounter for other preprocedural examination: Secondary | ICD-10-CM

## 2022-03-15 DIAGNOSIS — Z79899 Other long term (current) drug therapy: Secondary | ICD-10-CM | POA: Diagnosis not present

## 2022-03-15 HISTORY — PX: ROBOTIC ASSISTED LAPAROSCOPIC HYSTERECTOMY AND SALPINGECTOMY: SHX6379

## 2022-03-15 LAB — HEMOGLOBIN A1C
Hgb A1c MFr Bld: 7.8 % — ABNORMAL HIGH (ref 4.8–5.6)
Mean Plasma Glucose: 177.16 mg/dL

## 2022-03-15 LAB — CBC WITH DIFFERENTIAL/PLATELET
Abs Immature Granulocytes: 0.02 10*3/uL (ref 0.00–0.07)
Basophils Absolute: 0 10*3/uL (ref 0.0–0.1)
Basophils Relative: 0 %
Eosinophils Absolute: 0.1 10*3/uL (ref 0.0–0.5)
Eosinophils Relative: 1 %
HCT: 43.4 % (ref 36.0–46.0)
Hemoglobin: 13.6 g/dL (ref 12.0–15.0)
Immature Granulocytes: 0 %
Lymphocytes Relative: 32 %
Lymphs Abs: 2.1 10*3/uL (ref 0.7–4.0)
MCH: 29.7 pg (ref 26.0–34.0)
MCHC: 31.3 g/dL (ref 30.0–36.0)
MCV: 94.8 fL (ref 80.0–100.0)
Monocytes Absolute: 0.4 10*3/uL (ref 0.1–1.0)
Monocytes Relative: 6 %
Neutro Abs: 4 10*3/uL (ref 1.7–7.7)
Neutrophils Relative %: 61 %
Platelets: 268 10*3/uL (ref 150–400)
RBC: 4.58 MIL/uL (ref 3.87–5.11)
RDW: 13.8 % (ref 11.5–15.5)
WBC: 6.6 10*3/uL (ref 4.0–10.5)
nRBC: 0 % (ref 0.0–0.2)

## 2022-03-15 LAB — COMPREHENSIVE METABOLIC PANEL
ALT: 17 U/L (ref 0–44)
AST: 16 U/L (ref 15–41)
Albumin: 4.1 g/dL (ref 3.5–5.0)
Alkaline Phosphatase: 71 U/L (ref 38–126)
Anion gap: 9 (ref 5–15)
BUN: 15 mg/dL (ref 6–20)
CO2: 30 mmol/L (ref 22–32)
Calcium: 9 mg/dL (ref 8.9–10.3)
Chloride: 98 mmol/L (ref 98–111)
Creatinine, Ser: 0.96 mg/dL (ref 0.44–1.00)
GFR, Estimated: >60 mL/min (ref >60)
Glucose, Bld: 167 mg/dL — ABNORMAL HIGH (ref 70–99)
Potassium: 3.9 mmol/L (ref 3.5–5.1)
Sodium: 137 mmol/L (ref 135–145)
Total Bilirubin: 0.7 mg/dL (ref 0.3–1.2)
Total Protein: 8 g/dL (ref 6.5–8.1)

## 2022-03-15 LAB — GLUCOSE, CAPILLARY
Glucose-Capillary: 165 mg/dL — ABNORMAL HIGH (ref 70–99)
Glucose-Capillary: 279 mg/dL — ABNORMAL HIGH (ref 70–99)
Glucose-Capillary: 245 mg/dL — ABNORMAL HIGH (ref 70–99)
Glucose-Capillary: 148 mg/dL — ABNORMAL HIGH (ref 70–99)

## 2022-03-15 LAB — POCT PREGNANCY, URINE: Preg Test, Ur: NEGATIVE

## 2022-03-15 LAB — TYPE AND SCREEN
ABO/RH(D): O NEG
Antibody Screen: NEGATIVE

## 2022-03-15 LAB — RAPID HIV SCREEN (HIV 1/2 AB+AG)
HIV 1/2 Antibodies: NONREACTIVE
HIV-1 P24 Antigen - HIV24: NONREACTIVE

## 2022-03-15 LAB — TSH: TSH: 1.614 u[IU]/mL (ref 0.350–4.500)

## 2022-03-15 SURGERY — XI ROBOTIC ASSISTED LAPAROSCOPIC HYSTERECTOMY AND SALPINGECTOMY
Anesthesia: General | Site: Abdomen | Laterality: Bilateral

## 2022-03-15 MED ORDER — GABAPENTIN 300 MG PO CAPS
300.0000 mg | ORAL_CAPSULE | Freq: Three times a day (TID) | ORAL | Status: DC
  Administered 2022-03-15 – 2022-03-16 (×3): 300 mg via ORAL
  Filled 2022-03-15 (×3): qty 1

## 2022-03-15 MED ORDER — ZOLPIDEM TARTRATE 5 MG PO TABS: 5.0000 mg | ORAL_TABLET | Freq: Every evening | ORAL | Status: DC | PRN

## 2022-03-15 MED ORDER — ONDANSETRON HCL 4 MG/2ML IJ SOLN
4.0000 mg | Freq: Four times a day (QID) | INTRAMUSCULAR | Status: DC | PRN
  Administered 2022-03-15: 4 mg via INTRAVENOUS
  Filled 2022-03-15: qty 2

## 2022-03-15 MED ORDER — IBUPROFEN 600 MG PO TABS: 600.0000 mg | ORAL_TABLET | Freq: Four times a day (QID) | ORAL | Status: DC

## 2022-03-15 MED ORDER — GABAPENTIN 300 MG PO CAPS: 300.0000 mg | ORAL_CAPSULE | ORAL | Status: AC

## 2022-03-15 MED ORDER — BUPIVACAINE-MELOXICAM ER 200-6 MG/7ML IJ SOLN
INTRAMUSCULAR | Status: AC
  Filled 2022-03-15: qty 1

## 2022-03-15 MED ORDER — KETOROLAC TROMETHAMINE 30 MG/ML IJ SOLN
30.0000 mg | Freq: Four times a day (QID) | INTRAMUSCULAR | Status: AC
  Administered 2022-03-15 – 2022-03-16 (×4): 30 mg via INTRAVENOUS
  Filled 2022-03-15 (×4): qty 1

## 2022-03-15 MED ORDER — ONDANSETRON HCL 4 MG PO TABS
4.0000 mg | ORAL_TABLET | Freq: Four times a day (QID) | ORAL | Status: DC | PRN
  Administered 2022-03-16: 4 mg via ORAL
  Filled 2022-03-15: qty 1

## 2022-03-15 MED ORDER — CEFAZOLIN SODIUM 10 G IJ SOLR
INTRAMUSCULAR | Status: DC | PRN
  Administered 2022-03-15: 3 g via INTRAVENOUS

## 2022-03-15 MED ORDER — SOD CITRATE-CITRIC ACID 500-334 MG/5ML PO SOLN
30.0000 mL | ORAL | Status: DC
  Filled 2022-03-15: qty 30

## 2022-03-15 MED ORDER — DOCUSATE SODIUM 100 MG PO CAPS
100.0000 mg | ORAL_CAPSULE | Freq: Two times a day (BID) | ORAL | Status: DC
  Administered 2022-03-15 – 2022-03-16 (×2): 100 mg via ORAL
  Filled 2022-03-15 (×2): qty 1

## 2022-03-15 MED ORDER — INSULIN ASPART 100 UNIT/ML IJ SOLN
30.0000 [IU] | Freq: Three times a day (TID) | INTRAMUSCULAR | Status: DC
Start: 2022-03-15 — End: 2022-03-16
  Administered 2022-03-15 – 2022-03-16 (×3): 30 [IU] via SUBCUTANEOUS
  Filled 2022-03-15 (×3): qty 1

## 2022-03-15 MED ORDER — ENOXAPARIN SODIUM 40 MG/0.4ML IJ SOSY
PREFILLED_SYRINGE | INTRAMUSCULAR | Status: AC
  Administered 2022-03-15: 40 mg via SUBCUTANEOUS
  Filled 2022-03-15: qty 0.4

## 2022-03-15 MED ORDER — BUPIVACAINE-MELOXICAM ER 200-6 MG/7ML IJ SOLN
INTRAMUSCULAR | Status: DC | PRN
  Administered 2022-03-15: 7 mL

## 2022-03-15 MED ORDER — ACETAMINOPHEN 500 MG PO TABS: 1000.0000 mg | ORAL_TABLET | ORAL | Status: AC

## 2022-03-15 MED ORDER — DEXAMETHASONE SODIUM PHOSPHATE 10 MG/ML IJ SOLN
INTRAMUSCULAR | Status: DC | PRN
Start: 2022-03-15 — End: 2022-03-15
  Administered 2022-03-15: 5 mg via INTRAVENOUS

## 2022-03-15 MED ORDER — ARTIFICIAL TEARS OPHTHALMIC OINT
TOPICAL_OINTMENT | OPHTHALMIC | Status: DC | PRN
  Filled 2022-03-15: qty 3.5

## 2022-03-15 MED ORDER — ACETAMINOPHEN 500 MG PO TABS
ORAL_TABLET | ORAL | Status: AC
  Administered 2022-03-15: 1000 mg via ORAL
  Filled 2022-03-15: qty 2

## 2022-03-15 MED ORDER — KETOROLAC TROMETHAMINE 30 MG/ML IJ SOLN
INTRAMUSCULAR | Status: DC | PRN
  Administered 2022-03-15: 30 mg via INTRAVENOUS

## 2022-03-15 MED ORDER — MIDAZOLAM HCL 2 MG/2ML IJ SOLN
INTRAMUSCULAR | Status: DC | PRN
Start: 2022-03-15 — End: 2022-03-15
  Administered 2022-03-15 (×2): 1 mg via INTRAVENOUS

## 2022-03-15 MED ORDER — LABETALOL HCL 5 MG/ML IV SOLN
5.0000 mg | Freq: Once | INTRAVENOUS | Status: AC
  Administered 2022-03-15: 5 mg via INTRAVENOUS

## 2022-03-15 MED ORDER — INSULIN GLARGINE-YFGN 100 UNIT/ML ~~LOC~~ SOLN
100.0000 [IU] | Freq: Every day | SUBCUTANEOUS | Status: DC
  Administered 2022-03-15 (×2): 50 [IU] via SUBCUTANEOUS
  Filled 2022-03-15 (×3): qty 1

## 2022-03-15 MED ORDER — FAMOTIDINE 20 MG PO TABS: 20.0000 mg | ORAL_TABLET | Freq: Once | ORAL | Status: AC

## 2022-03-15 MED ORDER — FENTANYL CITRATE (PF) 100 MCG/2ML IJ SOLN
INTRAMUSCULAR | Status: AC
  Filled 2022-03-15: qty 2

## 2022-03-15 MED ORDER — POVIDONE-IODINE 10 % EX SWAB: 2.0000 "application " | Freq: Once | CUTANEOUS | Status: DC

## 2022-03-15 MED ORDER — METRONIDAZOLE 500 MG PO TABS
500.0000 mg | ORAL_TABLET | Freq: Two times a day (BID) | ORAL | Status: DC
  Administered 2022-03-16 (×2): 500 mg via ORAL
  Filled 2022-03-15 (×2): qty 1

## 2022-03-15 MED ORDER — LIDOCAINE HCL (CARDIAC) PF 100 MG/5ML IV SOSY
PREFILLED_SYRINGE | INTRAVENOUS | Status: DC | PRN
Start: 2022-03-15 — End: 2022-03-15
  Administered 2022-03-15: 100 mg via INTRAVENOUS

## 2022-03-15 MED ORDER — ALBUTEROL SULFATE (2.5 MG/3ML) 0.083% IN NEBU: 3.0000 mL | INHALATION_SOLUTION | Freq: Four times a day (QID) | RESPIRATORY_TRACT | Status: DC | PRN

## 2022-03-15 MED ORDER — BISACODYL 10 MG RE SUPP: 10.0000 mg | Freq: Every day | RECTAL | Status: DC | PRN

## 2022-03-15 MED ORDER — ROCURONIUM BROMIDE 100 MG/10ML IV SOLN
INTRAVENOUS | Status: DC | PRN
  Administered 2022-03-15: 10 mg via INTRAVENOUS
  Administered 2022-03-15: 50 mg via INTRAVENOUS
  Administered 2022-03-15: 10 mg via INTRAVENOUS
  Administered 2022-03-15: 30 mg via INTRAVENOUS
  Administered 2022-03-15: 20 mg via INTRAVENOUS

## 2022-03-15 MED ORDER — ENOXAPARIN SODIUM 40 MG/0.4ML IJ SOSY
40.0000 mg | PREFILLED_SYRINGE | INTRAMUSCULAR | Status: DC
  Administered 2022-03-16: 40 mg via SUBCUTANEOUS
  Filled 2022-03-15: qty 0.4

## 2022-03-15 MED ORDER — LACTATED RINGERS IV SOLN: INTRAVENOUS | Status: DC

## 2022-03-15 MED ORDER — SENNOSIDES-DOCUSATE SODIUM 8.6-50 MG PO TABS: 1.0000 | ORAL_TABLET | Freq: Every evening | ORAL | Status: DC | PRN

## 2022-03-15 MED ORDER — MENTHOL 3 MG MT LOZG: 1.0000 | LOZENGE | OROMUCOSAL | Status: DC | PRN

## 2022-03-15 MED ORDER — FENTANYL CITRATE PF 50 MCG/ML IJ SOSY: 50.0000 ug | PREFILLED_SYRINGE | INTRAMUSCULAR | Status: DC | PRN

## 2022-03-15 MED ORDER — ONDANSETRON HCL 4 MG/2ML IJ SOLN
INTRAMUSCULAR | Status: AC
  Filled 2022-03-15: qty 2

## 2022-03-15 MED ORDER — LABETALOL HCL 5 MG/ML IV SOLN
INTRAVENOUS | Status: AC
  Filled 2022-03-15: qty 4

## 2022-03-15 MED ORDER — PROPOFOL 10 MG/ML IV BOLUS
INTRAVENOUS | Status: DC | PRN
  Administered 2022-03-15: 180 mg via INTRAVENOUS

## 2022-03-15 MED ORDER — SUGAMMADEX SODIUM 200 MG/2ML IV SOLN
INTRAVENOUS | Status: DC | PRN
  Administered 2022-03-15: 270 mg via INTRAVENOUS

## 2022-03-15 MED ORDER — TIZANIDINE HCL 4 MG PO TABS
4.0000 mg | ORAL_TABLET | Freq: Four times a day (QID) | ORAL | Status: DC | PRN
  Filled 2022-03-15: qty 1

## 2022-03-15 MED ORDER — PANTOPRAZOLE SODIUM 40 MG PO TBEC
40.0000 mg | DELAYED_RELEASE_TABLET | Freq: Every day | ORAL | Status: DC
  Administered 2022-03-15 – 2022-03-16 (×2): 40 mg via ORAL
  Filled 2022-03-15 (×2): qty 1

## 2022-03-15 MED ORDER — DEXMEDETOMIDINE (PRECEDEX) IN NS 20 MCG/5ML (4 MCG/ML) IV SYRINGE
PREFILLED_SYRINGE | INTRAVENOUS | Status: DC | PRN
  Administered 2022-03-15: 12 ug via INTRAVENOUS
  Administered 2022-03-15: 8 ug via INTRAVENOUS

## 2022-03-15 MED ORDER — ONDANSETRON HCL 4 MG/2ML IJ SOLN
INTRAMUSCULAR | Status: DC | PRN
  Administered 2022-03-15: 4 mg via INTRAVENOUS

## 2022-03-15 MED ORDER — HYDROCODONE-ACETAMINOPHEN 5-325 MG PO TABS
1.0000 | ORAL_TABLET | ORAL | Status: DC | PRN
  Administered 2022-03-15: 2 via ORAL
  Filled 2022-03-15: qty 2

## 2022-03-15 MED ORDER — FENTANYL CITRATE (PF) 100 MCG/2ML IJ SOLN
25.0000 ug | INTRAMUSCULAR | Status: DC | PRN
  Administered 2022-03-15 (×2): 50 ug via INTRAVENOUS

## 2022-03-15 MED ORDER — BUPIVACAINE HCL 0.5 % IJ SOLN
INTRAMUSCULAR | Status: DC | PRN
  Administered 2022-03-15: 10 mL

## 2022-03-15 MED ORDER — CEFAZOLIN IN SODIUM CHLORIDE 3-0.9 GM/100ML-% IV SOLN
3.0000 g | INTRAVENOUS | Status: DC
  Filled 2022-03-15: qty 100

## 2022-03-15 MED ORDER — FAMOTIDINE 20 MG PO TABS
ORAL_TABLET | ORAL | Status: AC
  Administered 2022-03-15: 20 mg via ORAL
  Filled 2022-03-15: qty 1

## 2022-03-15 MED ORDER — ENOXAPARIN SODIUM 40 MG/0.4ML IJ SOSY: 40.0000 mg | PREFILLED_SYRINGE | INTRAMUSCULAR | Status: AC

## 2022-03-15 MED ORDER — CHLORHEXIDINE GLUCONATE 0.12 % MT SOLN
15.0000 mL | Freq: Once | OROMUCOSAL | Status: AC
Start: 2022-03-15 — End: 2022-03-15

## 2022-03-15 MED ORDER — ORAL CARE MOUTH RINSE: 15.0000 mL | Freq: Once | OROMUCOSAL | Status: AC

## 2022-03-15 MED ORDER — LABETALOL HCL 5 MG/ML IV SOLN
INTRAVENOUS | Status: DC | PRN
  Administered 2022-03-15: 5 mg via INTRAVENOUS

## 2022-03-15 MED ORDER — CHLORHEXIDINE GLUCONATE 0.12 % MT SOLN
OROMUCOSAL | Status: AC
  Administered 2022-03-15: 15 mL via OROMUCOSAL
  Filled 2022-03-15: qty 15

## 2022-03-15 MED ORDER — 0.9 % SODIUM CHLORIDE (POUR BTL) OPTIME
TOPICAL | Status: DC | PRN
Start: 2022-03-15 — End: 2022-03-15
  Administered 2022-03-15: 500 mL

## 2022-03-15 MED ORDER — LABETALOL HCL 5 MG/ML IV SOLN
40.0000 mg | INTRAVENOUS | Status: DC | PRN
  Administered 2022-03-15: 40 mg via INTRAVENOUS
  Filled 2022-03-15: qty 8

## 2022-03-15 MED ORDER — ESMOLOL HCL 100 MG/10ML IV SOLN
INTRAVENOUS | Status: DC | PRN
  Administered 2022-03-15: 20 mg via INTRAVENOUS

## 2022-03-15 MED ORDER — BUPIVACAINE HCL (PF) 0.5 % IJ SOLN
INTRAMUSCULAR | Status: AC
  Filled 2022-03-15: qty 30

## 2022-03-15 MED ORDER — LABETALOL HCL 5 MG/ML IV SOLN: 80.0000 mg | INTRAVENOUS | Status: DC | PRN

## 2022-03-15 MED ORDER — FENTANYL CITRATE (PF) 100 MCG/2ML IJ SOLN
INTRAMUSCULAR | Status: DC | PRN
  Administered 2022-03-15 (×2): 25 ug via INTRAVENOUS
  Administered 2022-03-15 (×3): 50 ug via INTRAVENOUS

## 2022-03-15 MED ORDER — SODIUM CHLORIDE 0.9 % IV SOLN: INTRAVENOUS | Status: DC

## 2022-03-15 MED ORDER — LISINOPRIL 20 MG PO TABS
40.0000 mg | ORAL_TABLET | Freq: Every day | ORAL | Status: DC
  Administered 2022-03-15: 40 mg via ORAL
  Filled 2022-03-15 (×2): qty 2

## 2022-03-15 MED ORDER — SIMETHICONE 80 MG PO CHEW: 80.0000 mg | CHEWABLE_TABLET | Freq: Four times a day (QID) | ORAL | Status: DC | PRN

## 2022-03-15 MED ORDER — ALUM & MAG HYDROXIDE-SIMETH 200-200-20 MG/5ML PO SUSP: 30.0000 mL | ORAL | Status: DC | PRN

## 2022-03-15 MED ORDER — LABETALOL HCL 5 MG/ML IV SOLN
20.0000 mg | INTRAVENOUS | Status: DC | PRN
  Administered 2022-03-15: 20 mg via INTRAVENOUS

## 2022-03-15 MED ORDER — GABAPENTIN 300 MG PO CAPS
ORAL_CAPSULE | ORAL | Status: AC
  Administered 2022-03-15: 300 mg via ORAL
  Filled 2022-03-15: qty 1

## 2022-03-15 MED ORDER — HYDRALAZINE HCL 20 MG/ML IJ SOLN: 10.0000 mg | INTRAMUSCULAR | Status: DC | PRN

## 2022-03-15 MED ORDER — ONDANSETRON HCL 4 MG/2ML IJ SOLN
4.0000 mg | Freq: Once | INTRAMUSCULAR | Status: AC | PRN
  Administered 2022-03-15: 4 mg via INTRAVENOUS

## 2022-03-15 SURGICAL SUPPLY — 80 items
BACTOSHIELD CHG 4% 4OZ (MISCELLANEOUS) ×1
BAG URINE DRAIN 2000ML AR STRL (UROLOGICAL SUPPLIES) ×2 IMPLANT
BARRIER ADHS 3X4 INTERCEED (GAUZE/BANDAGES/DRESSINGS) ×1 IMPLANT
BLADE SURG SZ11 CARB STEEL (BLADE) ×2 IMPLANT
CANNULA CAP OBTURATR AIRSEAL 8 (CAP) ×1 IMPLANT
CANNULA DILATOR 5 W/SLV (CANNULA) IMPLANT
CATH FOLEY 2WAY  5CC 16FR (CATHETERS) ×1
CATH URTH 16FR FL 2W BLN LF (CATHETERS) ×1 IMPLANT
COVER TIP SHEARS 8 DVNC (MISCELLANEOUS) ×1 IMPLANT
COVER TIP SHEARS 8MM DA VINCI (MISCELLANEOUS) ×1
COVER WAND RF STERILE (DRAPES) ×2 IMPLANT
DEFOGGER SCOPE WARMER CLEARIFY (MISCELLANEOUS) ×2 IMPLANT
DERMABOND ADVANCED (GAUZE/BANDAGES/DRESSINGS) ×1
DERMABOND ADVANCED .7 DNX12 (GAUZE/BANDAGES/DRESSINGS) ×1 IMPLANT
DRAPE 3/4 80X56 (DRAPES) ×4 IMPLANT
DRAPE ARM DVNC X/XI (DISPOSABLE) ×3 IMPLANT
DRAPE COLUMN DVNC XI (DISPOSABLE) ×1 IMPLANT
DRAPE DA VINCI XI ARM (DISPOSABLE) ×3
DRAPE DA VINCI XI COLUMN (DISPOSABLE) ×1
DRAPE ROBOT W/ LEGGING 30X125 (DRAPES) ×2 IMPLANT
ELECT REM PT RETURN 9FT ADLT (ELECTROSURGICAL) ×2
ELECTRODE REM PT RTRN 9FT ADLT (ELECTROSURGICAL) ×1 IMPLANT
GAUZE 4X4 16PLY ~~LOC~~+RFID DBL (SPONGE) ×4 IMPLANT
GLOVE BIO SURGEON STRL SZ 6.5 (GLOVE) ×8 IMPLANT
GLOVE SURG POLY ORTHO LF SZ7.5 (GLOVE) IMPLANT
GLOVE SURG UNDER LTX SZ7 (GLOVE) ×8 IMPLANT
GOWN STRL REUS W/ TWL LRG LVL3 (GOWN DISPOSABLE) ×4 IMPLANT
GOWN STRL REUS W/TWL LRG LVL3 (GOWN DISPOSABLE) ×4
GRASPER SUT TROCAR 14GX15 (MISCELLANEOUS) ×2 IMPLANT
GYRUS RUMI II 2.5CM BLUE (DISPOSABLE) ×2
GYRUS RUMI II 3.5CM BLUE (DISPOSABLE)
IRRIGATION STRYKERFLOW (MISCELLANEOUS) IMPLANT
IRRIGATOR STRYKERFLOW (MISCELLANEOUS) ×2
KIT IMAGING PINPOINTPAQ (MISCELLANEOUS) IMPLANT
KIT PINK PAD W/HEAD ARE REST (MISCELLANEOUS) ×2
KIT PINK PAD W/HEAD ARM REST (MISCELLANEOUS) ×1 IMPLANT
LABEL OR SOLS (LABEL) ×2 IMPLANT
MANIFOLD NEPTUNE II (INSTRUMENTS) ×2 IMPLANT
NEEDLE VERESS 14GA 120MM (NEEDLE) ×2 IMPLANT
NS IRRIG 1000ML POUR BTL (IV SOLUTION) ×2 IMPLANT
NS IRRIG 500ML POUR BTL (IV SOLUTION) ×1 IMPLANT
OBTURATOR OPTICAL STANDARD 8MM (TROCAR) ×1
OBTURATOR OPTICAL STND 8 DVNC (TROCAR) ×1
OBTURATOR OPTICALSTD 8 DVNC (TROCAR) IMPLANT
OCCLUDER COLPOPNEUMO (BALLOONS) ×2 IMPLANT
PACK GYN LAPAROSCOPIC (MISCELLANEOUS) ×2 IMPLANT
PAD OB MATERNITY 4.3X12.25 (PERSONAL CARE ITEMS) ×2 IMPLANT
RUMI II 3.0CM BLUE KOH-EFFICIE (DISPOSABLE) IMPLANT
RUMI II GYRUS 2.5CM BLUE (DISPOSABLE) IMPLANT
RUMI II GYRUS 3.5CM BLUE (DISPOSABLE) IMPLANT
SCISSORS METZENBAUM CVD 33 (INSTRUMENTS) ×2 IMPLANT
SCRUB CHG 4% DYNA-HEX 4OZ (MISCELLANEOUS) ×1 IMPLANT
SEAL CANN UNIV 5-8 DVNC XI (MISCELLANEOUS) ×3 IMPLANT
SEAL XI 5MM-8MM UNIVERSAL (MISCELLANEOUS) ×3
SEALER VESSEL DA VINCI XI (MISCELLANEOUS) ×1
SEALER VESSEL EXT DVNC XI (MISCELLANEOUS) IMPLANT
SET CYSTO W/LG BORE CLAMP LF (SET/KITS/TRAYS/PACK) IMPLANT
SET TUBE FILTERED XL AIRSEAL (SET/KITS/TRAYS/PACK) ×1 IMPLANT
SOL PREP PVP 2OZ (MISCELLANEOUS) ×2
SOLUTION ELECTROLUBE (MISCELLANEOUS) ×2 IMPLANT
SOLUTION PREP PVP 2OZ (MISCELLANEOUS) ×1 IMPLANT
SURGILUBE 2OZ TUBE FLIPTOP (MISCELLANEOUS) ×2 IMPLANT
SUT DVC VLOC 180 0 12IN GS21 (SUTURE) ×2
SUT MNCRL 4-0 (SUTURE) ×2
SUT MNCRL 4-0 27XMFL (SUTURE) ×2
SUT VIC AB 2-0 CT1 27 (SUTURE)
SUT VIC AB 2-0 CT1 TAPERPNT 27 (SUTURE) ×1 IMPLANT
SUT VICRYL 0 AB UR-6 (SUTURE) ×2 IMPLANT
SUTURE DVC VLC 180 0 12IN GS21 (SUTURE) ×1 IMPLANT
SUTURE MNCRL 4-0 27XMF (SUTURE) ×1 IMPLANT
SYR 10ML LL (SYRINGE) ×2 IMPLANT
SYR 50ML LL SCALE MARK (SYRINGE) ×2 IMPLANT
TIP RUMI ORANGE 6.7MMX12CM (TIP) IMPLANT
TIP UTERINE 5.1X6CM LAV DISP (MISCELLANEOUS) IMPLANT
TIP UTERINE 6.7X10CM GRN DISP (MISCELLANEOUS) ×1 IMPLANT
TIP UTERINE 6.7X6CM WHT DISP (MISCELLANEOUS) IMPLANT
TIP UTERINE 6.7X8CM BLUE DISP (MISCELLANEOUS) IMPLANT
TROCAR ENDO BLADELESS 11MM (ENDOMECHANICALS) ×1 IMPLANT
TUBING EVAC SMOKE HEATED PNEUM (TUBING) IMPLANT
WATER STERILE IRR 500ML POUR (IV SOLUTION) ×2 IMPLANT

## 2022-03-15 NOTE — H&P (Signed)
? ? ? ? ?GYNECOLOGY PREOPERATIVE HISTORY AND PHYSICAL  ?Subjective:  ?Brenda Hicks is a 56 y.o. [id number] here for surgical management of abnormal perimenopausal bleeding and uterine fibroid.Brenda Hicks reports that she has had problems with her cycles since she was a teen. Cycles are often irregular and heavy. Has used Depo Provera, IUD (1st one worked, second one with expelled). She notes that after the birth of her last child and tubal ligation in 1991, that her cycles have progressively worsened. Sometimes uses pads, tampons, and diapers simultaneously to control her bleeding. She even notes sometimes having severe cramping without a cycle. She underwent endometrial ablation in February 2022, noted approximately 1 year of relief before bleeding resumed again in February of this year. Significant preoperative concerns include morbid obesity, h/o sleep apnea, and type II DM. ? ?Proposed surgery: Robotic -assisted total laparoscopic hysterectomy with bilateral salpingectomy ? ? ? ?Pertinent Gynecological History: ?Menses:  irregular occuring ?Bleeding: dysfunctional uterine bleeding ?Contraception: tubal ligation ?Last pap: normal Date: 01/29/2019.  ?Colonoscopy: 02/26/2021. Results were normal, 1 benign polyp noted. ? ? ?Past Medical History:  ?Diagnosis Date  ? Anemia   ? vitamin d deficiency  ? Arthritis   ? Asthma   ? WELL CONTROLLED  ? Cancer of ear   ? skin cancer left ear  ? COPD (chronic obstructive pulmonary disease) ([place])   ? Diabetes mellitus without complication ([place])   ? Fatty liver   ? Hypertension   ? Kidney cysts   ? per patient, never had  ? Renal disorder   ? Sleep apnea   ? DOES NOT USE CPAP. waiting for new machine and a new sleep study  ? Stroke [facility]) May or June 2019  ? TIA. no residual symptoms  ? ?Past Surgical History:  ?Procedure Laterality Date  ? ANTERIOR CERVICAL DECOMP/DISCECTOMY FUSION N/A 08/09/2018  ? Procedure: ANTERIOR CERVICAL DECOMPRESSION/DISCECTOMY FUSION 1 LEVEL- C4-5;  Surgeon:  Meade Maw, MD;  Location: [facility] ORS;  Service: Neurosurgery;  Laterality: N/A;  ? BACK SURGERY    ? NECK  ? COLONOSCOPY WITH PROPOFOL N/A 02/26/2021  ? Procedure: COLONOSCOPY WITH PROPOFOL;  Surgeon: Jonathon Bellows, MD;  Location: [facility] ENDOSCOPY;  Service: Endoscopy;  Laterality: N/A;  ? DILATATION & CURETTAGE/HYSTEROSCOPY WITH MYOSURE N/A 01/19/2021  ? Procedure: DILATATION & CURETTAGE/HYSTEROSCOPY;  Surgeon: Rubie Maid, MD;  Location: [facility] ORS;  Service: Gynecology;  Laterality: N/A;  ? DILATION AND CURETTAGE OF UTERUS    ? ENDOMETRIAL ABLATION N/A 01/19/2021  ? Procedure: ENDOMETRIAL ABLATION, MINERVA;  Surgeon: Rubie Maid, MD;  Location: [facility] ORS;  Service: Gynecology;  Laterality: N/A;  ? ENDOMETRIAL BIOPSY    ? benign  ? EXPLORATORY LAPAROTOMY  1992  ? REMOVAL OF RUPTURED ECTOPIC  ? HAND SURGERY Right 1998  ? cyst removed  ? HERNIA REPAIR  1975  ? UMBILICAL  ? JOINT REPLACEMENT Right 2014  ? TKR  ? KNEE ARTHROSCOPY Right 2012  ? KNEE SURGERY Right 2014  ? total knee replacement  ? SHOULDER ARTHROSCOPY WITH BICEPSTENOTOMY Left 12/14/2016  ? Procedure: SHOULDER ARTHROSCOPY WITH BICEPSTENOTOMY;  Surgeon: Corky Mull, MD;  Location: [facility] ORS;  Service: Orthopedics;  Laterality: Left;  ? SHOULDER ARTHROSCOPY WITH OPEN ROTATOR CUFF REPAIR Left 12/14/2016  ? Procedure: SHOULDER ARTHROSCOPY WITH OPEN ROTATOR CUFF REPAIR AND ARTHROSCOPIC ROTATOR CUFF REPAIR;  Surgeon: Corky Mull, MD;  Location: [facility] ORS;  Service: Orthopedics;  Laterality: Left;  ? SHOULDER ARTHROSCOPY WITH ROTATOR CUFF REPAIR Right 01/04/2019  ? Procedure: SHOULDER ARTHROSCOPY  WITH ROTATOR CUFF REPAIR;  Surgeon: Corky Mull, MD;  Location: [facility] ORS;  Service: Orthopedics;  Laterality: Right;  ? SHOULDER ARTHROSCOPY WITH SUBACROMIAL DECOMPRESSION Left 12/14/2016  ? Procedure: SHOULDER ARTHROSCOPY WITH SUBACROMIAL DECOMPRESSION;  Surgeon: Corky Mull, MD;  Location: [facility] ORS;  Service: Orthopedics;  Laterality: Left;  ? SHOULDER ARTHROSCOPY  WITH SUBACROMIAL DECOMPRESSION AND BICEP TENDON REPAIR Right 01/04/2019  ? Procedure: SHOULDER ARTHROSCOPY WITH DEBRIDEMENT AND SUBACROMIAL DECOMPRESSION-RIGHT;  Surgeon: Corky Mull, MD;  Location: [facility] ORS;  Service: Orthopedics;  Laterality: Right;  ? TUBAL LIGATION    ? ?OB History  ?Gravida Para Term Preterm AB Living  ?_0 ?SAB IAB Ectopic Multiple Live Births  ?    1   6  ?  ?# Outcome Date GA Lbr Len/2nd Weight Sex Delivery Anes PTL Lv  ?7 Ectopic 1991          ?6 Term 11/11/89   3742 g M Vag-Spont  N LIV  ?5 Term 03/10/88   3232 g M Vag-Spont  N LIV  ?4 Term 02/15/87   2722 g M Vag-Spont  N LIV  ?3 Term 01/02/86   2551 g  Vag-Spont  N LIV  ?   Complications: Dysfunctional Labor  ?2 Preterm 12/31/84   2126 g M Vag-Spont  Y LIV  ?1 Term 03/05/84   2920 g M Vag-Spont  N LIV  ? ? ?Family History  ?Problem Relation Age of Onset  ? Hypertension Mother   ? Thyroid disease Mother   ? Lupus Mother   ? Congestive Heart Failure Mother   ? Hypertension Brother   ? CAD Maternal Grandmother   ? Breast cancer Neg Hx   ? Ovarian cancer Neg Hx   ? Colon cancer Neg Hx   ? ?Social History  ? ?Socioeconomic History  ? Marital status: Married  ?  Spouse name: brian  ? Number of children: 7  ? Years of education: Not on file  ? Highest education level: Not on file  ?Occupational History  ? Occupation: disabled  ?  Comment: disabled  ?Tobacco Use  ? Smoking status: Former  ?  Packs/day: 2.00  ?  Years: 25.00  ?  Pack years: 50.00  ?  Types: Cigarettes  ?  Quit date: 06/09/2017  ?  Years since quitting: 4.7  ? Smokeless tobacco: Never  ?Vaping Use  ? Vaping Use: Never used  ?Substance and Sexual Activity  ? Alcohol use: No  ? Drug use: Yes  ?  Frequency: 7.0 times per week  ?  Types: Marijuana  ?  Comment: Pt. uses marjiuana only when she has severe back and side pain  ? Sexual activity: Yes  ?  Partners: Male  ?  Birth control/protection: None  ?Other Topics Concern  ? Not on file  ?Social History Narrative  ? Have 6  biological childrean and custody of niece since 41 months.  ? ?Social Determinants of Health  ? ?Financial Resource Strain: Not on file  ?Food Insecurity: Not on file  ?Transportation Needs: Not on file  ?Physical Activity: Not on file  ?Stress: Not on file  ?Social Connections: Not on file  ?Intimate Partner Violence: Not on file  ? ?No current facility-administered medications on file prior to encounter.  ? ?Current Outpatient Medications on File Prior to Encounter  ?Medication Sig Dispense Refill  ? albuterol (VENTOLIN HFA) 108 (90 Base) MCG/ACT inhaler Inhale 2 puffs into the lungs every  6 (six) hours as needed for wheezing or shortness of breath. 18 g 3  ? gabapentin (NEURONTIN) 300 MG capsule Take 1 capsule (300 mg total) by mouth 3 (three) times daily. (Patient taking differently: Take 300 mg by mouth 3 (three) times daily as needed (pain).) 90 capsule 1  ? insulin glargine (LANTUS) 100 UNIT/ML injection Inject 1 mL (100 Units total) into the skin daily. (Patient taking differently: Inject 100 Units into the skin every evening.) 10 mL 3  ? insulin lispro (HUMALOG) 100 UNIT/ML injection Inject 0.3 mLs (30 Units total) into the skin 3 (three) times daily before meals. 10 mL 3  ? lisinopril (ZESTRIL) 40 MG tablet Take 40 mg by mouth at bedtime.    ? naproxen sodium (ALEVE) 220 MG tablet Take 220 mg by mouth daily as needed (pain).    ? ondansetron (ZOFRAN) 4 MG tablet Take 1 tablet by mouth every 8 (eight) hours as needed for vomiting or nausea.    ? ACCU-CHEK GUIDE test strip USE TO CHECK BLOOD SUGAR UP TO FOUR TIMES DAILY AS DIRECTED 100 each 3  ? atorvastatin (LIPITOR) 40 MG tablet Take 1 tablet (40 mg total) by mouth daily. (Patient not taking: Reported on 03/04/2022) 90 tablet 3  ? blood glucose meter kit and supplies Dispense based on patient and insurance preference. Use up to four times daily as directed. (FOR ICD-10 E10.9, E11.9). 1 each 3  ? Dulaglutide (TRULICITY) 5.54 YO/8.2OJ SOPN Inject 0.75 mg into  the skin once a week. (Patient not taking: Reported on 03/04/2022) 2 mL 3  ? Insulin Syringe-Needle U-100 (INSULIN SYRINGE 1CC/31GX5/16") 31G X 5/16" 1 ML MISC Use 4 times daily as directed. 100 each 3  ? Maxie Better

## 2022-03-15 NOTE — Progress Notes (Signed)
Pt with severe range pressures this shift. MD notified, PRN order set placed for labetalol. Pt complains of headache and right eye pain (Dr Marcelline Mates aware of eye issue). Eye ointment was applied by RN earlier. Headache remains ongoing. Plan to recheck BP in 1 hr.  ?

## 2022-03-15 NOTE — Progress Notes (Signed)
Pt bp recheck at 2005 was not treatable range. Pt nausea has subsided. Patient still states her headache is 8/10 but says this is common when she has pain in other areas of her body. PRN norco was given and pt was able to eat 2 saltine crackers. Pt states right eye has improved some as well.  ?

## 2022-03-15 NOTE — Anesthesia Procedure Notes (Signed)
Procedure Name: Intubation ?Date/Time: 03/15/2022 10:23 AM ?Performed by: Loletha Grayer, CRNA ?Pre-anesthesia Checklist: Patient identified, Patient being monitored, Timeout performed, Emergency Drugs available and Suction available ?Patient Re-evaluated:Patient Re-evaluated prior to induction ?Oxygen Delivery Method: Circle system utilized ?Preoxygenation: Pre-oxygenation with 100% oxygen ?Induction Type: IV induction ?Ventilation: Mask ventilation without difficulty ?Laryngoscope Size: Mac and 3 ?Grade View: Grade II ?Tube type: Oral ?Tube size: 7.0 mm ?Number of attempts: 1 ?Airway Equipment and Method: Stylet ?Placement Confirmation: ETT inserted through vocal cords under direct vision, positive ETCO2 and breath sounds checked- equal and bilateral ?Secured at: 21 cm ?Tube secured with: Tape ?Dental Injury: Teeth and Oropharynx as per pre-operative assessment  ? ? ? ? ?

## 2022-03-15 NOTE — Transfer of Care (Signed)
Immediate Anesthesia Transfer of Care Note ? ?Patient: Brenda Hicks ? ?Procedure(s) Performed: XI ROBOTIC ASSISTED LAPAROSCOPIC HYSTERECTOMY (Bilateral: Abdomen) ? ?Patient Location: PACU ? ?Anesthesia Type:General ? ?Level of Consciousness: awake ? ?Airway & Oxygen Therapy: Patient Spontanous Breathing and Patient connected to face mask oxygen ? ?Post-op Assessment: Report given to RN and Post -op Vital signs reviewed and stable ? ?Post vital signs: Reviewed and stable ? ?Last Vitals:  ?Vitals Value Taken Time  ?BP 176/96 03/15/22 1408  ?Temp 36.6 ?C 03/15/22 1407  ?Pulse 68 03/15/22 1412  ?Resp 19 03/15/22 1412  ?SpO2 97 % 03/15/22 1412  ?Vitals shown include unvalidated device data. ? ?Last Pain:  ?Vitals:  ? 03/15/22 0944  ?TempSrc: Oral  ?PainSc: 10-Worst pain ever  ?   ? ?  ? ?Complications: No notable events documented. ?

## 2022-03-15 NOTE — Op Note (Signed)
Robotic Hysterectomy Procedure Note ? ?Indications: The patient is a 56 y.o. y.o. [id number] female with a history of perimenopausal abnormal uterine bleeding.  She has a history of morbid obesity, fibroid uterus, DM, HTN. Also with previous therapies including Depot Provera, IUD, and endometrial ablation.   She presents today for definitive therapy with hysterectomy.  Previous tubal surgery (sterilization) ? ?Pre-operative Diagnosis: Perimenopausal abnormal uterine bleeding, morbid obesity (BMI 48), fibroid uterus, DM, HTN, s/p endometrial ablation. ? ?Post-operative Diagnosis: Same, with dense abdominal adhesions. ? ?Operation: Robotic-assisted total laparoscopic hysterectomy, lysis of adhesions ? ?Findings: Fibroid uterus with irregular contours, globular shape. Fallopian tubes previously surgically interrupted. Dense omental adhesions to the anterior abdominal wall.  ? ? ?Estimated Blood Loss:  less than 50 mL ?        ?Drains: Foley catheter to gravity, 1500 ml of clear urine at end of the procedure.   ?        ?Total IV Fluids: 1000 ml ?       ?Specimens: Uterus with cervix, with proximal segments of fallopian tubes ?        ?Complications:  None; patient tolerated the procedure well. ?        ?Disposition: PACU - hemodynamically stable. ?        ?Condition: stable ? ?Surgeon: Rubie Maid, MD ? ?Assistants: Jeannie Fend, MD ? ?Anesthesia: General endotracheal anesthesia ? ? ?Procedure Details  ?The patient was seen in the Holding Room. The risks, benefits, complications, treatment options, and expected outcomes were discussed with the patient.  The patient concurred with the proposed plan, giving informed consent.  The site of surgery properly noted. The patient was taken to Operating Room # 4, identified as Brenda Hicks and the procedure verified as robotic laparoscopic hysterectomy with bilateral salpingectomy. A Time Out was held and the above information confirmed. ? ?After induction of anesthesia, the  patient was prepped and draped in the usual sterile manner. Pt was placed in dorsal lithotomy position after anesthesia and draped and prepped in the usual sterile manner. Foley catheter was placed. ? ?A sterile speculum was placed into the vagina.  The cervix was grasped with a single-tooth tenaculum and the uterus was sounded to 10 cm. The cervix was measured to 2.5 cm.  A RUMI uterine manipulator was placed   ? ?Attention was then turned to the abdomen, where  an 8 mm incision was made supraumubilcally.  The Veress needle was passed and a pneumoperitoneum was established.  The Veress needle was then removed and an 8 mm port was placed supraumbilically.  The daVinci camera was then placed supraumbilically. Three more ports were then placed. There were two 8 mm ports that were placed 10 cm laterally to the umbilicus and 2 cm inferiorly on either side.  The 11 mm assistant port was then placed in the left upper quadrant 3 cm lateral and superior to supraumbilical port. All incisions were injected with local anesthetic (Sensorcaine 0.5%, total of 20 cc) prior to port placement. Extensive adhesions of the omentum to the anterior abdominal cavity were noted.  The adhesions were lysed with blunt and sharp dissection using monopolar laparoscopic scissors. A total of 40 minutes of the total case time was spent performing lysis of adhesions. The daVinci robot was then docked in the normal fashion. The patient was placed in steep Trendelenburg positioning. ? ?Inspection of the pelvis showed an irregular shaped globular uterus, normal ovaries, and proximal segment of fallopian tubes (fallopian tubes were previously  surgically interrupted).  The right mesosalpinx of the fallopian tube was cauterized and cut using the vessel sealer device.   ? ?The utero-ovarian ligament was also coagulated and cut. The round ligament was coagulated and cut. A bladder flap was created and the bladder was dissected down from the cervix.This  entire procedure was then repeated on the left side.  ? ?The uterine arteries were then skeletonized, and cauterized using the vessel sealer device.  The blue balloon cuff was then identified and anincision was made in the cervicovaginal junction on top of the vaginal cuff. This was also repeated posteriorly. The incision was extended laterally, freeing the uterus from the surrounding vagina. The uterus was then ?delivered posteriorly through the vagina using the robotic assistant.  ? ? The vaginal cuff was closed with a running suture of 0 Vicryl V-lock. The ureters were identified bilaterally. The entire pelvis was hemostatic. The right assistant site was closed with a cone and PMI device using a 0-Vicryl under direct visualization. The skin was closed with 4-0 Monocryl using subcuticular stitches. Prior to closure, a total of 6 ml of ZynRelief gel (bupivicaine mix with meloxicam).  Dermabond was placed over all incisions. ? ?The final needle, sponge, and instrument count was correct. The patient tolerated the procedure well. Patient to the recovery room in good condition.  ? ? Dr. Jeannie Fend, surgical assistant was present for the entire procedure.  An experienced assistant was required given the standard of surgical care given the complexity of the case.  This assistant was needed for exposure, dissection, suctioning, retraction, instrument exchange, and for overall help during the procedure. ? ? ?Rubie Maid, MD ?[facility] ?

## 2022-03-15 NOTE — Progress Notes (Signed)
Notified patient husband that she is doing very well. Finished in recovery and will be going to room 347 on the 3rd floor in approximately 15 minutes. ?

## 2022-03-15 NOTE — Anesthesia Preprocedure Evaluation (Signed)
Anesthesia Evaluation  ?Patient identified by MRN, date of birth, ID band ?Patient awake ? ? ? ?Reviewed: ?Allergy & Precautions, H&P , NPO status , Patient's Chart, lab work & pertinent test results ? ?History of Anesthesia Complications ?Negative for: history of anesthetic complications ? ?Airway ?Mallampati: III ? ?TM Distance: >3 FB ?Neck ROM: limited ? ? ? Dental ? ?(+) Edentulous Lower, Edentulous Upper, Dental Advidsory Given ?  ?Pulmonary ?neg shortness of breath, asthma , sleep apnea , COPD, neg recent URI, former smoker,  ?  ?breath sounds clear to auscultation ? ? ? ? ? ? Cardiovascular ?hypertension, (-) angina+ Peripheral Vascular Disease  ? ?Rhythm:regular Rate:Normal ? ? ?  ?Neuro/Psych ?neg Seizures S/p cervical fusion ? Neuromuscular disease (diabetic peripheral neuropathy) CVA, No Residual Symptoms negative psych ROS  ? GI/Hepatic ?Neg liver ROS, GERD  ,  ?Endo/Other  ?diabetesMorbid obesity ? Renal/GU ?Renal disease (renal cysts)  ? ?  ?Musculoskeletal ? ? Abdominal ?  ?Peds ? Hematology ? ?(+) Blood dyscrasia, anemia ,   ?Anesthesia Other Findings ?Past Medical History: ?No date: Arthritis ?No date: Asthma ?    Comment:  WELL CONTROLLED ?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 ?    Comment:  skin cancer left ear ?No date: Diabetes mellitus without complication ([place]) ?No date: Fatty liver ?No date: Hypertension ?No date: Kidney cysts ?No date: Renal disorder ?No date: Sleep apnea ?    Comment:  USES CPAP ?May or June 2019: Stroke [facility]) ?    Comment:  TIA ? ?Past Surgical History: ?No date: DILATION AND CURETTAGE OF UTERUS ?No date: ENDOMETRIAL BIOPSY ?No date: EXPLORATORY LAPAROTOMY ?    Comment:  REMOVAL OF RUPTURED ECTOPIC ?No date: HAND SURGERY ?No date: HERNIA REPAIR ?    Comment:  UMBILICAL ?No date: JOINT REPLACEMENT; Right ?    Comment:  TKR ?No date: KNEE ARTHROSCOPY ?No date: KNEE SURGERY; Right ?12/14/2016: SHOULDER ARTHROSCOPY WITH BICEPSTENOTOMY; Left ?    Comment:   Procedure: SHOULDER ARTHROSCOPY WITH BICEPSTENOTOMY;   ?             Surgeon: Corky Mull, MD;  Location: [facility] ORS;  Service: ?             Orthopedics;  Laterality: Left; ?12/14/2016: SHOULDER ARTHROSCOPY WITH OPEN ROTATOR CUFF REPAIR; Left ?    Comment:  Procedure: SHOULDER ARTHROSCOPY WITH OPEN ROTATOR CUFF  ?             REPAIR AND ARTHROSCOPIC ROTATOR CUFF REPAIR;  Surgeon:  ?             Corky Mull, MD;  Location: [facility] ORS;  Service:  ?             Orthopedics;  Laterality: Left; ?12/14/2016: SHOULDER ARTHROSCOPY WITH SUBACROMIAL DECOMPRESSION; Left ?    Comment:  Procedure: SHOULDER ARTHROSCOPY WITH SUBACROMIAL  ?             DECOMPRESSION;  Surgeon: Corky Mull, MD;  Location:  ?             [facility] ORS;  Service: Orthopedics;  Laterality: Left; ?No date: TUBAL LIGATION ? ?BMI   ? Body Mass Index:  53.93 kg/m?  ?  ? ? Reproductive/Obstetrics ?negative OB ROS ? ?  ? ? ? ? ? ? ? ? ? ? ? ? ? ?  ?  ? ? ? ? ? ? ? ? ?Anesthesia Physical ? ?Anesthesia Plan ? ?ASA: 3 ? ?Anesthesia Plan: General  ? ?  Post-op Pain Management:   ? ?Induction: Intravenous ? ?PONV Risk Score and Plan: Ondansetron, Dexamethasone, Midazolam and Treatment may vary due to age or medical condition ? ?Airway Management Planned: Oral ETT ? ?Additional Equipment:  ? ?Intra-op Plan:  ? ?Post-operative Plan: Extubation in OR ? ?Informed Consent: I have reviewed the patients History and Physical, chart, labs and discussed the procedure including the risks, benefits and alternatives for the proposed anesthesia with the patient or authorized representative who has indicated his/her understanding and acceptance.  ? ? ? ?Dental Advisory Given ? ?Plan Discussed with: Anesthesiologist, CRNA and Surgeon ? ?Anesthesia Plan Comments:   ? ? ? ? ? ? ?Anesthesia Quick Evaluation ? ?

## 2022-03-16 ENCOUNTER — Ambulatory Visit: Payer: Medicaid Other | Admitting: Internal Medicine

## 2022-03-16 ENCOUNTER — Encounter: Payer: Self-pay | Admitting: Obstetrics and Gynecology

## 2022-03-16 DIAGNOSIS — D251 Intramural leiomyoma of uterus: Secondary | ICD-10-CM | POA: Diagnosis not present

## 2022-03-16 LAB — GLUCOSE, CAPILLARY
Glucose-Capillary: 129 mg/dL — ABNORMAL HIGH (ref 70–99)
Glucose-Capillary: 92 mg/dL (ref 70–99)

## 2022-03-16 MED ORDER — SIMETHICONE 80 MG PO CHEW
80.0000 mg | CHEWABLE_TABLET | Freq: Four times a day (QID) | ORAL | 2 refills | Status: DC | PRN
Start: 2022-03-16 — End: 2023-02-07

## 2022-03-16 MED ORDER — DOCUSATE SODIUM 100 MG PO CAPS: 100.0000 mg | ORAL_CAPSULE | Freq: Two times a day (BID) | ORAL | 2 refills | Status: DC | PRN

## 2022-03-16 MED ORDER — HYDROCODONE-ACETAMINOPHEN 5-325 MG PO TABS: 1.0000 | ORAL_TABLET | Freq: Four times a day (QID) | ORAL | 0 refills | Status: DC | PRN

## 2022-03-16 MED ORDER — METRONIDAZOLE 500 MG PO TABS: 500.0000 mg | ORAL_TABLET | Freq: Two times a day (BID) | ORAL | 0 refills | Status: DC

## 2022-03-16 NOTE — Progress Notes (Deleted)
Established Patient Office Visit  Subjective:  Patient ID: Brenda Hicks, female    DOB: 09/22/1966  Age: 56 y.o. MRN: [id number]  CC:  No chief complaint on file.   HPI Brenda Hicks presents for follow up on chronic medical conditions.   Hypertension, OSA: -Medications: Lisinopril-HCTZ 20-12.25m -Patient is compliant with medications and reports no side effects.  -Checking BP at home: no -Denies any SOB, CP, LE edema, medication SEs, or symptoms of hypotension -Was previously diagnosed with OSA but has been without a CPAP in some time - Sleep study early December showing mild OSA with plans to return to the sleep center for PAP titration on 2/10.    Diabetes, Type 2 -Last A1c 8.8 10/22 -Medications: Lantus 100u daily, Humalog 378HTID, Trulicity 08.85on Sundays  -Checking BG at home: fasting - 120-160; post-prandial 160-170; highest 195. No low blood sugars -Diet: working on eating less carbs, lost about 5 pounds -Exercise: increased walking, trying to stay active with grandchildren, starting weight lifting  -Eye exam: last 08/14/20 - appointment made Dec 22, 2021 -Foot exam: 10/22 -Microalbumin: on ACEI -Statin: Lipitor 40 -PNA vaccine: UTD   HLD: -Medications: Lipitor 40 mg new at last visit -Tolerating medication well, no side effects, compliant.  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 Seen by Neurosurg 5/20 for R arm pain, cervical and shoulder MRI unrevealing. Neurosurg recommending considering injections or therapy. Also saw Orthopedics, considering knee replacement.   Past Medical History:  Diagnosis Date   Anemia    vitamin d deficiency   Arthritis    Asthma    WELL CONTROLLED   Cancer of ear    skin cancer left ear   COPD (chronic obstructive pulmonary disease) ([place]    Diabetes mellitus without complication ([place]    Fatty liver    Hypertension    Kidney cysts    per patient, never had    Renal disorder    Sleep apnea    DOES NOT USE CPAP. waiting for new machine and a new sleep study   Stroke ([facility] May or June 2019   TIA. no residual symptoms    Past Surgical History:  Procedure Laterality Date   ANTERIOR CERVICAL DECOMP/DISCECTOMY FUSION N/A 08/09/2018   Procedure: ANTERIOR CERVICAL DECOMPRESSION/DISCECTOMY FUSION 1 LEVEL- C4-5;  Surgeon: YMeade Maw MD;  Location: [facility] OR[facility];  Service: Neurosurgery;  Laterality: N/A;   BACK SURGERY     NECK   COLONOSCOPY WITH PROPOFOL N/A 02/26/2021   Procedure: COLONOSCOPY WITH PROPOFOL;  Surgeon: AJonathon Bellows MD;  [facility]  Service: Endoscopy;  Laterality: N/A;   DILATATION & CURETTAGE/HYSTEROSCOPY WITH MYOSURE N/A 01/19/2021   Procedure: DILATATION & CURETTAGE/HYSTEROSCOPY;  Surgeon: CRubie Maid MD;  Location: [facility];  Service: Gynecology;  Laterality: N/A;   DILATION AND CURETTAGE OF UTERUS     ENDOMETRIAL ABLATION N/A 01/19/2021   Procedure: ENDOMETRIAL ABLATION, MINERVA;  Surgeon: CRubie Maid MD;  Location: [facility];  Service: Gynecology;  Laterality: N/A;   ENDOMETRIAL BIOPSY     benign   EXPLORATORY LAPAROTOMY  1992   REMOVAL OF RUPTURED ECTOPIC   HAND SURGERY Right 1998   cyst removed   HERNIA REPAIR  [id number]  UMBILICAL   JOINT REPLACEMENT Right 2014   TKR   KNEE ARTHROSCOPY Right 2012   KNEE SURGERY Right 2014   total knee replacement   SHOULDER ARTHROSCOPY WITH BICEPSTENOTOMY Left 12/14/2016  Procedure: SHOULDER ARTHROSCOPY WITH BICEPSTENOTOMY;  Surgeon: Corky Mull, MD;  Location: [facility];  Service: Orthopedics;  Laterality: Left;   SHOULDER ARTHROSCOPY WITH OPEN ROTATOR CUFF REPAIR Left 12/14/2016   Procedure: SHOULDER ARTHROSCOPY WITH OPEN ROTATOR CUFF REPAIR AND ARTHROSCOPIC ROTATOR CUFF REPAIR;  Surgeon: Corky Mull, MD;  Location: [facility];  Service: Orthopedics;  Laterality: Left;   SHOULDER ARTHROSCOPY WITH ROTATOR CUFF REPAIR Right 01/04/2019   Procedure: SHOULDER ARTHROSCOPY WITH  ROTATOR CUFF REPAIR;  Surgeon: Corky Mull, MD;  Location: [facility];  Service: Orthopedics;  Laterality: Right;   SHOULDER ARTHROSCOPY WITH SUBACROMIAL DECOMPRESSION Left 12/14/2016   Procedure: SHOULDER ARTHROSCOPY WITH SUBACROMIAL DECOMPRESSION;  Surgeon: Corky Mull, MD;  Location: [facility];  Service: Orthopedics;  Laterality: Left;   SHOULDER ARTHROSCOPY WITH SUBACROMIAL DECOMPRESSION AND BICEP TENDON REPAIR Right 01/04/2019   Procedure: SHOULDER ARTHROSCOPY WITH DEBRIDEMENT AND SUBACROMIAL DECOMPRESSION-RIGHT;  Surgeon: Corky Mull, MD;  Location: [facility];  Service: Orthopedics;  Laterality: Right;   TUBAL LIGATION      Family History  Problem Relation Age of Onset   Hypertension Mother    Thyroid disease Mother    Lupus Mother    Congestive Heart Failure Mother    Hypertension Brother    CAD Maternal Grandmother    Breast cancer Neg Hx    Ovarian cancer Neg Hx    Colon cancer Neg Hx     Social History   Socioeconomic History   Marital status: Married    Spouse name: brian   Number of children: 7   Years of education: Not on file   Highest education level: Not on file  Occupational History   Occupation: disabled    Comment: disabled  Tobacco Use   Smoking status: Former    Packs/day: 2.00    Years: 25.00    Pack years: 50.00    Types: Cigarettes    Quit date: 06/09/2017    Years since quitting: 4.7   Smokeless tobacco: Never  Vaping Use   Vaping Use: Never used  Substance and Sexual Activity   Alcohol use: No   Drug use: Yes    Frequency: 7.0 times per week    Types: Marijuana    Comment: Pt. uses marjiuana only when she has severe back and side pain   Sexual activity: Yes    Partners: Male    Birth control/protection: None  Other Topics Concern   Not on file  Social History Narrative   Have 6 biological childrean and custody of niece since 16 months.   Social Determinants of Health   Financial Resource Strain: Not on file  Food Insecurity: Not on  file  Transportation Needs: Not on file  Physical Activity: Not on file  Stress: Not on file  Social Connections: Not on file  Intimate Partner Violence: Not on file    Facility-Administered Medications Prior to Visit  Medication Dose Route Frequency Provider Last Rate Last Admin   albuterol (PROVENTIL) (2.5 MG/3ML) 0.083% nebulizer solution 3 mL  3 mL Nebulization Q6H PRN Rubie Maid, MD       alum & mag hydroxide-simeth (MAALOX/MYLANTA) 200-200-20 MG/5ML suspension 30 mL  30 mL Oral Q4H PRN Rubie Maid, MD       artificial tears (LACRILUBE) ophthalmic ointment   Right Eye Q4H PRN Rubie Maid, MD       bisacodyl (DULCOLAX) suppository 10 mg  10 mg Rectal Daily PRN Rubie Maid, MD       docusate  sodium (COLACE) capsule 100 mg  100 mg Oral BID Rubie Maid, MD   100 mg at 03/15/22 2229   enoxaparin (LOVENOX) injection 40 mg  40 mg Subcutaneous Q24H Rubie Maid, MD       fentaNYL (SUBLIMAZE) injection 50 mcg  50 mcg Intravenous Q1H PRN Rubie Maid, MD       gabapentin (NEURONTIN) capsule 300 mg  300 mg Oral TID Rubie Maid, MD   300 mg at 03/15/22 2229   labetalol (NORMODYNE) injection 20 mg  20 mg Intravenous PRN Harlin Heys, MD   20 mg at 03/15/22 1826   And   labetalol (NORMODYNE) injection 40 mg  40 mg Intravenous PRN Harlin Heys, MD   40 mg at 03/15/22 1478   And   labetalol (NORMODYNE) injection 80 mg  80 mg Intravenous PRN Harlin Heys, MD       And   hydrALAZINE (APRESOLINE) injection 10 mg  10 mg Intravenous PRN Harlin Heys, MD       HYDROcodone-acetaminophen (NORCO/VICODIN) 5-325 MG per tablet 1-2 tablet  1-2 tablet Oral Q4H PRN Rubie Maid, MD   2 tablet at 03/15/22 2030   ibuprofen (ADVIL) tablet 600 mg  600 mg Oral Q6H Rubie Maid, MD       insulin aspart (novoLOG) injection 30 Units  30 Units Subcutaneous TID WC Rubie Maid, MD   30 Units at 03/15/22 1758   insulin glargine-yfgn (SEMGLEE) injection 100 Units  100 Units  Subcutaneous QHS Rubie Maid, MD   50 Units at 03/15/22 2303   lactated ringers infusion   Intravenous Continuous Rubie Maid, MD 100 mL/hr at 03/16/22 0211 New Bag at 03/16/22 0211   lisinopril (ZESTRIL) tablet 40 mg  40 mg Oral QHS Rubie Maid, MD   40 mg at 03/15/22 1745   menthol-cetylpyridinium (CEPACOL) lozenge 3 mg  1 lozenge Oral Q2H PRN Rubie Maid, MD       metroNIDAZOLE (FLAGYL) tablet 500 mg  500 mg Oral Q12H Rubie Maid, MD   500 mg at 03/16/22 0014   ondansetron (ZOFRAN) tablet 4 mg  4 mg Oral Q6H PRN Rubie Maid, MD       Or   ondansetron [facility]) injection 4 mg  4 mg Intravenous Q6H PRN Rubie Maid, MD   4 mg at 03/15/22 2110   pantoprazole (PROTONIX) EC tablet 40 mg  40 mg Oral Daily Rubie Maid, MD   40 mg at 03/15/22 1746   senna-docusate (Senokot-S) tablet 1 tablet  1 tablet Oral QHS PRN Rubie Maid, MD       simethicone (MYLICON) chewable tablet 80 mg  80 mg Oral QID PRN Rubie Maid, MD       tiZANidine (ZANAFLEX) tablet 4 mg  4 mg Oral Q6H PRN Rubie Maid, MD       zolpidem (AMBIEN) tablet 5 mg  5 mg Oral QHS PRN Rubie Maid, MD       Outpatient Medications Prior to Visit  Medication Sig Dispense Refill   ACCU-CHEK GUIDE test strip USE TO CHECK BLOOD SUGAR UP TO FOUR TIMES DAILY AS DIRECTED 100 each 3   albuterol (VENTOLIN HFA) 108 (90 Base) MCG/ACT inhaler Inhale 2 puffs into the lungs every 6 (six) hours as needed for wheezing or shortness of breath. 18 g 3   atorvastatin (LIPITOR) 40 MG tablet Take 1 tablet (40 mg total) by mouth daily. (Patient not taking: Reported on 03/04/2022) 90 tablet 3   blood glucose meter kit  and supplies Dispense based on patient and insurance preference. Use up to four times daily as directed. (FOR ICD-10 E10.9, E11.9). 1 each 3   Dulaglutide (TRULICITY) 2.83 MO/2.9UT SOPN Inject 0.75 mg into the skin once a week. (Patient not taking: Reported on 03/04/2022) 2 mL 3   gabapentin (NEURONTIN) 300 MG capsule Take 1 capsule  (300 mg total) by mouth 3 (three) times daily. (Patient taking differently: Take 300 mg by mouth 3 (three) times daily as needed (pain).) 90 capsule 1   insulin glargine (LANTUS) 100 UNIT/ML injection Inject 1 mL (100 Units total) into the skin daily. (Patient taking differently: Inject 100 Units into the skin every evening.) 10 mL 3   insulin lispro (HUMALOG) 100 UNIT/ML injection Inject 0.3 mLs (30 Units total) into the skin 3 (three) times daily before meals. 10 mL 3   Insulin Syringe-Needle U-100 (INSULIN SYRINGE 1CC/31GX5/16") 31G X 5/16" 1 ML MISC Use 4 times daily as directed. 100 each 3   Lancets (ACCU-CHEK MULTICLIX) lancets Use as instructed 100 each 12   lisinopril (ZESTRIL) 40 MG tablet Take 40 mg by mouth at bedtime.     naproxen sodium (ALEVE) 220 MG tablet Take 220 mg by mouth daily as needed (pain).     ondansetron (ZOFRAN) 4 MG tablet Take 1 tablet by mouth every 8 (eight) hours as needed for vomiting or nausea.     tiZANidine (ZANAFLEX) 4 MG tablet Take 1 tablet (4 mg total) by mouth every 6 (six) hours as needed for muscle spasms. 30 tablet 0    Allergies  Allergen Reactions   Dilaudid [Hydromorphone Hcl] Nausea And Vomiting   Morphine And Related Nausea And Vomiting   Tramadol Nausea Only    If she takes antinausea medicine with this medicine, then she can tolerate it.    ROS Review of Systems  Constitutional:  Positive for appetite change. Negative for chills and fever.  Eyes:  Negative for visual disturbance.  Respiratory:  Negative for cough and shortness of breath.   Cardiovascular:  Negative for chest pain.  Gastrointestinal:  Negative for abdominal pain, diarrhea, nausea and vomiting.  Neurological:  Negative for dizziness and headaches.     Objective:    Physical Exam Constitutional:      Appearance: Normal appearance. She is obese.  HENT:     Head: Normocephalic and atraumatic.  Eyes:     Conjunctiva/sclera: Conjunctivae normal.  Cardiovascular:      Rate and Rhythm: Normal rate and regular rhythm.  Pulmonary:     Effort: Pulmonary effort is normal.     Breath sounds: Normal breath sounds.  Musculoskeletal:     Right lower leg: No edema.     Left lower leg: No edema.  Skin:    General: Skin is warm and dry.  Neurological:     General: No focal deficit present.     Mental Status: She is alert. Mental status is at baseline.  Psychiatric:        Mood and Affect: Mood normal.        Behavior: Behavior normal.    LMP 02/22/2022 (Approximate)  Wt Readings from Last 3 Encounters:  03/15/22 300 lb (136.1 kg)  03/08/22 (!) 311 lb (141.1 kg)  01/20/22 (!) 311 lb 3.2 oz (141.2 kg)     Health Maintenance Due  Topic Date Due   MAMMOGRAM  Never done   Zoster Vaccines- Shingrix (1 of 2) Never done   OPHTHALMOLOGY EXAM  12/22/2021   FOOT  EXAM  01/14/2022   PAP SMEAR-Modifier  01/28/2022    There are no preventive care reminders to display for this patient.  Lab Results  Component Value Date   TSH 1.614 03/15/2022   Lab Results  Component Value Date   WBC 6.6 03/15/2022   HGB 13.6 03/15/2022   HCT 43.4 03/15/2022   MCV 94.8 03/15/2022   PLT 268 03/15/2022   Lab Results  Component Value Date   NA 137 03/15/2022   K 3.9 03/15/2022   CO2 30 03/15/2022   GLUCOSE 167 (H) 03/15/2022   BUN 15 03/15/2022   CREATININE 0.96 03/15/2022   BILITOT 0.7 03/15/2022   ALKPHOS 71 03/15/2022   AST 16 03/15/2022   ALT 17 03/15/2022   PROT 8.0 03/15/2022   ALBUMIN 4.1 03/15/2022   CALCIUM 9.0 03/15/2022   ANIONGAP 9 03/15/2022   EGFR 79 01/18/2022   Lab Results  Component Value Date   CHOL 185 12/14/2021   Lab Results  Component Value Date   HDL 41 (L) 12/14/2021   Lab Results  Component Value Date   LDLCALC 122 (H) 12/14/2021   Lab Results  Component Value Date   TRIG 109 12/14/2021   Lab Results  Component Value Date   CHOLHDL 4.5 12/14/2021   Lab Results  Component Value Date   HGBA1C 7.8 (H) 03/15/2022       Assessment & Plan:   1. Type 2 diabetes mellitus with hyperglycemia, with long-term current use of insulin ([place]): Needing refills on insulin today, discussed how weight loss will require less insulin and to keep checking sugars regularly and let me know if she has ny hypoglycemic events or if her sugar is low. Doing well on Trulicity, decreasing her appetite and helping her lose weight. Recheck A1c today. Plan to follow up in 3 months.   - HgB A1c - insulin glargine (LANTUS) 100 UNIT/ML injection; Inject 1 mL (100 Units total) into the skin daily.  Dispense: 10 mL; Refill: 3 - insulin lispro (HUMALOG) 100 UNIT/ML injection; Inject 0.3 mLs (30 Units total) into the skin 3 (three) times daily before meals.  Dispense: 10 mL; Refill: 3 - Dulaglutide (TRULICITY) 9.74 BU/3.8GT SOPN; Inject 0.75 mg into the skin once a week.  Dispense: 2 mL; Refill: 3 - Insulin Syringe-Needle U-100 (INSULIN SYRINGE 1CC/31GX5/16") 31G X 5/16" 1 ML MISC; Use 4 times daily as directed.  Dispense: 100 each; Refill: 3 - ACCU-CHEK GUIDE test strip; USE TO CHECK BLOOD SUGAR UP TO FOUR TIMES DAILY AS DIRECTED  Dispense: 100 each; Refill: 3  2. Essential hypertension: Stable, continue medications.  3. Hyperlipidemia associated with type 2 diabetes mellitus ([place]): Stable, been on Lipitor for 3 months now, recheck lipid panel. - Lipid Profile  4. Encounter for screening mammogram for malignant neoplasm of breast: Mammogram ordered.   - MM Digital Screening; Future  5. Arthritis: Following with Ortho, discussing possible knee replacement.   Follow-up: No follow-ups on file.    [name], DO

## 2022-03-16 NOTE — Anesthesia Postprocedure Evaluation (Signed)
Anesthesia Post Note ? ?Patient: Brenda Hicks ? ?Procedure(s) Performed: XI ROBOTIC ASSISTED LAPAROSCOPIC HYSTERECTOMY (Bilateral: Abdomen) ? ?Patient location during evaluation: PACU ?Anesthesia Type: General ?Level of consciousness: awake and alert ?Pain management: pain level controlled ?Vital Signs Assessment: post-procedure vital signs reviewed and stable ?Respiratory status: spontaneous breathing, nonlabored ventilation, respiratory function stable and patient connected to nasal cannula oxygen ?Cardiovascular status: blood pressure returned to baseline and stable ?Postop Assessment: no apparent nausea or vomiting ?Anesthetic complications: no ? ? ?No notable events documented. ? ? ?Last Vitals:  ?Vitals:  ? 03/16/22 0742 03/16/22 1147  ?BP: (!) 102/57 109/68  ?Pulse: 64 66  ?Resp: 16 16  ?Temp: 36.8 ?C 37.1 ?C  ?SpO2: 100% 99%  ?  ?Last Pain:  ?Vitals:  ? 03/16/22 1147  ?TempSrc: Oral  ?PainSc:   ? ? ?  ?  ?  ?  ?  ?  ? ?Martha Clan ? ? ? ? ?

## 2022-03-16 NOTE — Progress Notes (Signed)
Patient discharged to home. Medications/Prescriptions and discharge instructions reviewed with patient who verbalized understanding. Pt discharged with family via W/C. Will schedule follow-up as instructed. 

## 2022-03-16 NOTE — Discharge Summary (Signed)
Gynecology Physician Postoperative Discharge Summary  ?Patient ID: ?Brenda Hicks ?MRN: [id number] ?DOB/AGE: 54-22-1967 54 y.o. ? ?Admit Date: 03/15/2022 ?Discharge Date: 03/16/2022 ? ?Preoperative Diagnoses: Abnormal perimenopausal bleeding, fibroid uterus, morbid obesity, HTN, DM Type II ? ?Procedures: Procedure(s) (LRB): ?XI ROBOTIC ASSISTED LAPAROSCOPIC HYSTERECTOMY (Bilateral) ? ?Hospital Course:  ?Brenda Hicks is a 56 y.o. [id number]  admitted for scheduled surgery.  She underwent the procedures as mentioned above, her operation was uncomplicated. For further details about surgery, please refer to the operative report. Patient had an uncomplicated postoperative course. By time of discharge on POD#1, her pain was controlled on oral pain medications; she was ambulating, voiding without difficulty, tolerating regular diet and passing flatus. She was deemed stable for discharge to home.  ? ?Significant Labs: ? ?  Latest Ref Rng & Units 03/15/2022  ?  9:49 AM 09/24/2021  ? 12:34 PM 01/16/2021  ?  9:30 AM  ?CBC  ?WBC 4.0 - 10.5 K/uL 6.6   5.1   5.4    ?Hemoglobin 12.0 - 15.0 g/dL 13.6   13.9   11.7    ?Hematocrit 36.0 - 46.0 % 43.4   44.2   37.3    ?Platelets 150 - 400 K/uL 268   225   244    ? ? ?Lab Results  ?Component Value Date  ? CREATININE 0.96 03/15/2022  ? ? ? ?Discharge Exam: ?Blood pressure (!) 102/57, pulse 64, temperature 98.2 ?F (36.8 ?C), temperature source Oral, resp. rate 16, height $RemoveBe'5\' 6"'KElpLeqhK$  (1.676 m), weight 136.1 kg, last menstrual period 02/22/2022, SpO2 100 %. ?General appearance: alert and no distress  ?Resp: clear to auscultation bilaterally  ?Cardio: regular rate and rhythm  ?GI: soft, non-tender; bowel sounds normal; no masses, no organomegaly.  ?Incision: C/D/I, no erythema, no drainage noted ?Pelvic: scant blood on pad (done in presence of RN as chaperone)  ?Extremities: extremities normal, atraumatic, no cyanosis or edema and Homans sign is negative, no sign of DVT ? ?Discharged Condition:  Stable ? ?Disposition: Discharge disposition: 01-Home or Self Care ? ? ? ? ? ? ?Discharge Instructions   ? ? Discharge patient   Complete by: As directed ?  ? Discharge disposition: 01-Home or Self Care  ? Discharge patient date: 03/16/2022  ? ?  ? ?Allergies as of 03/16/2022   ? ?   Reactions  ? Dilaudid [hydromorphone Hcl] Nausea And Vomiting  ? Morphine And Related Nausea And Vomiting  ? Tramadol Nausea Only  ? If she takes antinausea medicine with this medicine, then she can tolerate it.  ? ?  ? ?  ?Medication List  ?  ? ?TAKE these medications   ? ?Accu-Chek Guide test strip ?Generic drug: glucose blood ?USE TO CHECK BLOOD SUGAR UP TO FOUR TIMES DAILY AS DIRECTED ?  ?accu-chek multiclix lancets ?Use as instructed ?  ?albuterol 108 (90 Base) MCG/ACT inhaler ?Commonly known as: VENTOLIN HFA ?Inhale 2 puffs into the lungs every 6 (six) hours as needed for wheezing or shortness of breath. ?  ?atorvastatin 40 MG tablet ?Commonly known as: LIPITOR ?Take 1 tablet (40 mg total) by mouth daily. ?  ?blood glucose meter kit and supplies ?Dispense based on patient and insurance preference. Use up to four times daily as directed. (FOR ICD-10 E10.9, E11.9). ?  ?docusate sodium 100 MG capsule ?Commonly known as: COLACE ?Take 1 capsule (100 mg total) by mouth 2 (two) times daily as needed. ?  ?gabapentin 300 MG capsule ?Commonly known as: Neurontin ?Take 1 capsule (  300 mg total) by mouth 3 (three) times daily. ?What changed:  ?when to take this ?reasons to take this ?  ?HYDROcodone-acetaminophen 5-325 MG tablet ?Commonly known as: NORCO/VICODIN ?Take 1-2 tablets by mouth every 6 (six) hours as needed for moderate pain. ?  ?insulin glargine 100 UNIT/ML injection ?Commonly known as: LANTUS ?Inject 1 mL (100 Units total) into the skin daily. ?What changed: when to take this ?  ?insulin lispro 100 UNIT/ML injection ?Commonly known as: HUMALOG ?Inject 0.3 mLs (30 Units total) into the skin 3 (three) times daily before meals. ?   ?INSULIN SYRINGE 1CC/31GX5/16" 31G X 5/16" 1 ML Misc ?Use 4 times daily as directed. ?  ?lisinopril 40 MG tablet ?Commonly known as: ZESTRIL ?Take 40 mg by mouth at bedtime. ?  ?metroNIDAZOLE 500 MG tablet ?Commonly known as: FLAGYL ?Take 1 tablet (500 mg total) by mouth 2 (two) times daily. ?  ?naproxen sodium 220 MG tablet ?Commonly known as: ALEVE ?Take 220 mg by mouth daily as needed (pain). ?  ?ondansetron 4 MG tablet ?Commonly known as: ZOFRAN ?Take 1 tablet by mouth every 8 (eight) hours as needed for vomiting or nausea. ?  ?simethicone 80 MG chewable tablet ?Commonly known as: Gas-X ?Chew 1 tablet (80 mg total) by mouth 4 (four) times daily as needed for flatulence. ?  ?tiZANidine 4 MG tablet ?Commonly known as: Zanaflex ?Take 1 tablet (4 mg total) by mouth every 6 (six) hours as needed for muscle spasms. ?  ?Trulicity 4.71 TN/5.3XY Sopn ?Generic drug: Dulaglutide ?Inject 0.75 mg into the skin once a week. ?  ? ?  ? ?Future Appointments  ?Date Time Provider [place]  ?03/16/2022 10:20 AM [name], DO [facility]  ? ? ? ?Total discharge time: 15 minutes  ? ?Signed: ? ?Rubie Maid, MD ?[facility] ? ?  ?

## 2022-03-16 NOTE — Progress Notes (Signed)
Post-Operative Day 1, s/p robotic total laparoscopic hysterectomy ? ?Subjective: ?no complaints, up ad lib, voiding, and tolerating PO. Patient noted eye irritation last night, treating with artifical tears.  ? ?Objective: ?Temp:  [97.6 ?F (36.4 ?C)-98.2 ?F (36.8 ?C)] 98.2 ?F (36.8 ?C) (04/25 8832) ?Pulse Rate:  [64-81] 64 (04/25 0742) ?Resp:  [14-23] 16 (04/25 0742) ?BP: (98-193)/(47-97) 102/57 (04/25 5498) ?SpO2:  [96 %-100 %] 100 % (04/25 0742) ?Weight:  [136.1 kg] 136.1 kg (04/24 0944) ? ?Physical Exam:  ?General: alert and no distress  ?Lungs: clear to auscultation bilaterally ?Heart: regular rate and rhythm, S1, S2 normal, no murmur, click, rub or gallop ?Abdomen: soft, non-tender; bowel sounds normal; no masses,  no organomegaly ?Pelvis:Bleeding: scant,  Incision: healing well ?Extremities: DVT Evaluation: No evidence of DVT seen on physical exam. ?Negative Homan's sign. ?No cords or calf tenderness. ?No significant calf/ankle edema. ? ?Recent Labs  ?  03/15/22 ?0949  ?HGB 13.6  ?HCT 43.4  ? ? ? ?Lab Results  ?Component Value Date  ? CREATININE 0.96 03/15/2022  ? ? ? ? ? ?Assessment/Plan: ?Doing well postoperatively. ?Continue to monitor BPs. Had to treat with IV Labetalol protocol overnight. Resumed Lisinopril PO yesterday. ?Blood sugars uncontrolled yesterday afternoon, better control by the evening. Continue home insulin regimen.  ?Regular diet ?Encourage ambulation.  ?Discharge home today.  ? ? ? ?Rubie Maid, MD ?[facility] ? ? ? ?

## 2022-03-17 ENCOUNTER — Telehealth: Payer: Self-pay | Admitting: Obstetrics and Gynecology

## 2022-03-17 LAB — SURGICAL PATHOLOGY

## 2022-03-17 NOTE — Telephone Encounter (Signed)
Pt called to schedule her 2 week wound check and 6 week post-op; apts scheduled. Pt states she had trouble getting her pain medication- she had to change the provider to Dr.Cherry that can get rx for narcotics from. She is requesting that pain medication rx be resent. Confirmed pharmacy as [facility] on [address]. Please advise.  ?

## 2022-03-18 LAB — RPR: RPR Ser Ql: NONREACTIVE

## 2022-03-18 NOTE — Telephone Encounter (Signed)
Called pharmacy, patient was able to pick up her prescription yesterday afternoon.

## 2022-03-31 ENCOUNTER — Encounter: Payer: Self-pay | Admitting: Obstetrics and Gynecology

## 2022-03-31 ENCOUNTER — Ambulatory Visit (INDEPENDENT_AMBULATORY_CARE_PROVIDER_SITE_OTHER): Payer: Medicaid Other | Admitting: Obstetrics and Gynecology

## 2022-03-31 VITALS — BP 144/94 | HR 68 | Resp 16 | Ht 66.0 in | Wt 309.5 lb

## 2022-03-31 DIAGNOSIS — Z09 Encounter for follow-up examination after completed treatment for conditions other than malignant neoplasm: Secondary | ICD-10-CM

## 2022-03-31 DIAGNOSIS — Z90710 Acquired absence of both cervix and uterus: Secondary | ICD-10-CM

## 2022-03-31 NOTE — Progress Notes (Signed)
? ? ?  OBSTETRICS/GYNECOLOGY POST-OPERATIVE CLINIC VISIT ? ?Subjective:  ?  ? Brenda Hicks is a 56 y.o. female who presents to the clinic 2 weeks status post XI ROBOTIC ASSISTED LAPAROSCOPIC HYSTERECTOMY for perimenopausal menorrhagia. Eating a regular diet without difficulty. Bowel movements are normal. Pain is controlled with current analgesics. Medications being used: naproxen (OTC). ? ?The following portions of the patient's history were reviewed and updated as appropriate: allergies, current medications, past family history, past medical history, past social history, past surgical history, and problem list. ? ?Review of Systems ?Pertinent items noted in HPI and remainder of comprehensive ROS otherwise negative. ?  ?Objective:  ? ?BP (!) 144/94   Pulse 68   Resp 16   Ht '5\' 6"'$  (1.676 m)   Wt (!) 309 lb 8 oz (140.4 kg)   LMP 02/22/2022 (Approximate)   BMI 49.95 kg/m?  Body mass index is 49.95 kg/m?. ? ?General:  alert and no distress  ?Abdomen: soft, bowel sounds active, non-tender  ?Incision:   healing well, no drainage, no erythema, no hernia, no seroma, no swelling, no dehiscence, incision well approximated  ? ? ?Pathology:  ? ?A. UTERUS WITH CERVIX; HYSTERECTOMY:  ?- CERVIX WITHOUT PATHOLOGIC CHANGES.  ?- POLYP-LIKE AREAS OF ENDOMETRIAL CYSTIC CHANGE AND FIBROSIS.  ?- RESIDUAL SECRETORY ENDOMETRIUM.  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  ?- NEGATIVE FOR ATYPIA AND MALIGNANCY.  ? ?BILATERAL FALLOPIAN TUBES; SALPINGECTOMY:  ?- PROXIMAL PORTIONS OF FALLOPIAN TUBES WITHOUT PATHOLOGIC CHANGES. ? ?Assessment:  ? ?Patient s/p ROBOTIC ASSISTED LAPAROSCOPIC HYSTERECTOMY (surgery)  ?Doing well postoperatively. ?  ?Plan:  ? ?1. Continue any current medications as instructed by provider. ?2. Wound care discussed. ?3. Operative findings again reviewed. Pathology report discussed. ?4. Activity restrictions: no lifting more than 10-15 pounds and pelvic rest x 6 weeks.  ?5. Anticipated return to work: not  applicable. ?6. Follow up: 4 weeks for final post-op check  ? ? ? ?Rubie Maid, MD ?[facility] ? ?

## 2022-04-22 NOTE — Progress Notes (Signed)
    OBSTETRICS/GYNECOLOGY POST-OPERATIVE CLINIC VISIT  Subjective:     Brenda Hicks is a 56 y.o. female who presents to the clinic 6 weeks status post XI ROBOTIC ASSISTED LAPAROSCOPIC HYSTERECTOMY for perimenopausal menorrhagia. Eating a regular diet without difficulty, but she has a decreased appetite. Bowel movements are abnormal with constipation .  Notes prior to surgery she would have a BM 3-4 times daily. Now only going maybe every other day (using Colace). Reports mostly crampy pain in her lower back and pelvic area since having surgery, pain comes and goes. Takes Aleve for the pain.   The following portions of the patient's history were reviewed and updated as appropriate: allergies, current medications, past family history, past medical history, past social history, past surgical history, and problem list.  Review of Systems Pertinent items noted in HPI and remainder of comprehensive ROS otherwise negative.   Objective:   LMP 02/22/2022 (Approximate)  Body mass index is 49.89 kg/m.  General:  alert and no distress  Abdomen: soft, bowel sounds active, non-tender  Incision:   healing well, no drainage, no erythema, no hernia, no seroma, no swelling, no dehiscence, incision well approximated    Pathology:  A. UTERUS WITH CERVIX; HYSTERECTOMY:  - CERVIX WITHOUT PATHOLOGIC CHANGES.  - POLYP-LIKE AREAS OF ENDOMETRIAL CYSTIC CHANGE AND FIBROSIS.  - RESIDUAL SECRETORY ENDOMETRIUM.  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  - NEGATIVE FOR ATYPIA AND MALIGNANCY.   BILATERAL FALLOPIAN TUBES; SALPINGECTOMY:  - PROXIMAL PORTIONS OF FALLOPIAN TUBES WITHOUT PATHOLOGIC CHANGES.  Assessment:   Patient s/p XI ROBOTIC ASSISTED LAPAROSCOPIC HYSTERECTOMY (surgery)  Postoperative course complicated by constipation   Plan:   1. Continue any current medications as instructed by provider. Using stool softeners, but will prescribe Miralax regimen for 14 days. 2. Wound care  discussed. 3. Operative findings again reviewed. Pathology report discussed. 4. Activity restrictions: none 5. Anticipated return to work: not applicable. 6. Follow up: Return to clinic for any scheduled appointments or for any gynecologic concerns as needed.     Rubie Maid, MD [facility]

## 2022-04-26 ENCOUNTER — Encounter: Payer: Self-pay | Admitting: Internal Medicine

## 2022-04-26 ENCOUNTER — Ambulatory Visit: Payer: Medicaid Other | Admitting: Internal Medicine

## 2022-04-26 ENCOUNTER — Other Ambulatory Visit: Payer: Self-pay

## 2022-04-26 VITALS — BP 132/80 | HR 89 | Temp 98.2°F | Resp 16 | Ht 66.0 in | Wt 307.9 lb

## 2022-04-26 DIAGNOSIS — E1169 Type 2 diabetes mellitus with other specified complication: Secondary | ICD-10-CM | POA: Diagnosis not present

## 2022-04-26 DIAGNOSIS — Z794 Long term (current) use of insulin: Secondary | ICD-10-CM

## 2022-04-26 DIAGNOSIS — E1165 Type 2 diabetes mellitus with hyperglycemia: Secondary | ICD-10-CM

## 2022-04-26 DIAGNOSIS — I10 Essential (primary) hypertension: Secondary | ICD-10-CM | POA: Diagnosis not present

## 2022-04-26 DIAGNOSIS — J45909 Unspecified asthma, uncomplicated: Secondary | ICD-10-CM | POA: Diagnosis not present

## 2022-04-26 DIAGNOSIS — E785 Hyperlipidemia, unspecified: Secondary | ICD-10-CM

## 2022-04-26 MED ORDER — TRULICITY 0.75 MG/0.5ML ~~LOC~~ SOAJ: 0.7500 mg | SUBCUTANEOUS | 3 refills | Status: DC

## 2022-04-26 MED ORDER — INSULIN LISPRO 100 UNIT/ML IJ SOLN: 30.0000 [IU] | Freq: Three times a day (TID) | INTRAMUSCULAR | 11 refills | Status: DC

## 2022-04-26 MED ORDER — INSULIN GLARGINE 100 UNIT/ML ~~LOC~~ SOLN: 100.0000 [IU] | Freq: Every evening | SUBCUTANEOUS | 9 refills | Status: DC

## 2022-04-26 MED ORDER — ALBUTEROL SULFATE HFA 108 (90 BASE) MCG/ACT IN AERS: 2.0000 | INHALATION_SPRAY | Freq: Four times a day (QID) | RESPIRATORY_TRACT | 3 refills | Status: DC | PRN

## 2022-04-26 MED ORDER — FREESTYLE LIBRE 3 SENSOR MISC: 1.0000 | Freq: Four times a day (QID) | 3 refills | Status: DC

## 2022-04-26 MED ORDER — ATORVASTATIN CALCIUM 40 MG PO TABS: 40.0000 mg | ORAL_TABLET | Freq: Every day | ORAL | 3 refills | Status: DC

## 2022-04-26 MED ORDER — LISINOPRIL 40 MG PO TABS: 40.0000 mg | ORAL_TABLET | Freq: Every day | ORAL | 1 refills | Status: DC

## 2022-04-26 NOTE — Progress Notes (Signed)
Established Patient Office Visit  Subjective:  Patient ID: Brenda Hicks, female    DOB: [birth year]  Age: 56 y.o. MRN: [id number]  CC:  Chief Complaint  Patient presents with   Diabetes   Hyperlipidemia   Hypertension    HPI Brenda Hicks presents for follow up on chronic medical conditions. Recently underwent a hysterectomy in April, is doing well.   Hypertension, OSA: -Medications: Had been on Lisinopril-HCTZ 20-12.50m but ran out of medications about 1-2 weeks ago. BP here today not too bad despite not being on medications. Had been having some headaches, so her Lisinopril was changed to 40 mg a day but headaches stopped after she had an updated eye exam. -Patient is compliant with medications and reports no side effects.  -Checking BP at home: no -Denies any SOB, CP, LE edema, medication SEs, or symptoms of hypotension -Was previously diagnosed with OSA but has been without a CPAP in some time - Sleep study early December showing mild OSA with plans to return to the sleep center for PAP titration    Diabetes, Type 2 -Last A1c 7.8% 4/23 -Medications: Lantus 100u daily, Humalog 391YTID, Trulicity 06.06on Sundays - has been out of all meds for 1-2 weeks needs refills  -Checking BG at home: fasting - 120-160; post-prandial 160-170; highest 195. No low blood sugars -Diet: working on eating less carbs -Exercise: increased walking, trying to stay active with grandchildren, starting weight lifting  -Eye exam: Dec 22, 2021 -Foot exam: 10/22 -Microalbumin: on ACEI -Statin: Lipitor 40 -PNA vaccine: UTD   HLD: -Medications: Lipitor 40 mg, needs refilled  -Tolerating medication well, no side effects, compliant.  -Last lipid panel Lipid Panel     Component Value Date/Time   CHOL 185 12/14/2021 1019   TRIG 109 12/14/2021 1019   HDL 41 (L) 12/14/2021 1019   CHOLHDL 4.5 12/14/2021 1019   VLDL 20 03/31/2018 0539   LDLCALC 122 (H) 12/14/2021 1019   The 10-year ASCVD risk score  (Arnett DK, et al., 2019) is: 14.8%   Values used to calculate the score:     Age: [age]years     Sex: Female     Is Non-Hispanic African American: Yes     Diabetic: Yes     Tobacco smoker: No     Systolic Blood Pressure: 1004mmHg     Is BP treated: Yes     HDL Cholesterol: 41 mg/dL     Total Cholesterol: 185 mg/dL  Asthma:  -Asthma status: stable -Current Treatments: albuterol PRN, needs refill -Satisfied with current treatment?: yes -Current upper respiratory symptoms: no -Triggers: pollen -Visits to ER or Urgent Care in past year: no -Pneumovax: Up to Date -Influenza: Up to Date  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 Seen by Neurosurg 5/20 for R arm pain, cervical and shoulder MRI unrevealing. Neurosurg recommending considering injections or therapy. Also following with Orthopedics for left knee pain, note reviewed 03/05/22. She is on Celebrex for pain and was given a steroid injection in the knee.   Past Medical History:  Diagnosis Date   Anemia    vitamin d deficiency   Arthritis    Asthma    WELL CONTROLLED   Cancer of ear    skin cancer left ear   COPD (chronic obstructive pulmonary disease) ([facility]    Diabetes mellitus without complication ([facility]    Fatty liver    Hypertension    Kidney cysts  per patient, never had   Renal disorder    Sleep apnea    DOES NOT USE CPAP. waiting for new machine and a new sleep study   Stroke [facility]) May or June 2019   TIA. no residual symptoms    Past Surgical History:  Procedure Laterality Date   ABDOMINAL HYSTERECTOMY     ANTERIOR CERVICAL DECOMP/DISCECTOMY FUSION N/A 08/09/2018   Procedure: ANTERIOR CERVICAL DECOMPRESSION/DISCECTOMY FUSION 1 LEVEL- C4-5;  Surgeon: Meade Maw, MD;  Location: [facility] ORS;  Service: Neurosurgery;  Laterality: N/A;   BACK SURGERY     NECK   COLONOSCOPY WITH PROPOFOL N/A 02/26/2021   Procedure: COLONOSCOPY WITH PROPOFOL;  Surgeon: Jonathon Bellows, MD;  Location: [facility]  ENDOSCOPY;  Service: Endoscopy;  Laterality: N/A;   DILATATION & CURETTAGE/HYSTEROSCOPY WITH MYOSURE N/A 01/19/2021   Procedure: DILATATION & CURETTAGE/HYSTEROSCOPY;  Surgeon: Rubie Maid, MD;  Location: [facility] ORS;  Service: Gynecology;  Laterality: N/A;   DILATION AND CURETTAGE OF UTERUS     ENDOMETRIAL ABLATION N/A 01/19/2021   Procedure: ENDOMETRIAL ABLATION, MINERVA;  Surgeon: Rubie Maid, MD;  Location: [facility];  Service: Gynecology;  Laterality: N/A;   ENDOMETRIAL BIOPSY     benign   EXPLORATORY LAPAROTOMY  1992   REMOVAL OF RUPTURED ECTOPIC   HAND SURGERY Right 1998   cyst removed   HERNIA REPAIR  4287   UMBILICAL   JOINT REPLACEMENT Right 2014   TKR   KNEE ARTHROSCOPY Right 2012   KNEE SURGERY Right 2014   total knee replacement   ROBOTIC ASSISTED LAPAROSCOPIC HYSTERECTOMY AND SALPINGECTOMY Bilateral 03/15/2022   Procedure: XI ROBOTIC ASSISTED LAPAROSCOPIC HYSTERECTOMY;  Surgeon: Rubie Maid, MD;  Location: [facility] OR[facility];  Service: Gynecology;  Laterality: Bilateral;   SHOULDER ARTHROSCOPY WITH BICEPSTENOTOMY Left 12/14/2016   Procedure: SHOULDER ARTHROSCOPY WITH BICEPSTENOTOMY;  Surgeon: Corky Mull, MD;  Location: [facility];  Service: Orthopedics;  Laterality: Left;   SHOULDER ARTHROSCOPY WITH OPEN ROTATOR CUFF REPAIR Left 12/14/2016   Procedure: SHOULDER ARTHROSCOPY WITH OPEN ROTATOR CUFF REPAIR AND ARTHROSCOPIC ROTATOR CUFF REPAIR;  Surgeon: Corky Mull, MD;  Location: [facility];  Service: Orthopedics;  Laterality: Left;   SHOULDER ARTHROSCOPY WITH ROTATOR CUFF REPAIR Right 01/04/2019   Procedure: SHOULDER ARTHROSCOPY WITH ROTATOR CUFF REPAIR;  Surgeon: Corky Mull, MD;  Location: [facility];  Service: Orthopedics;  Laterality: Right;   SHOULDER ARTHROSCOPY WITH SUBACROMIAL DECOMPRESSION Left 12/14/2016   Procedure: SHOULDER ARTHROSCOPY WITH SUBACROMIAL DECOMPRESSION;  Surgeon: Corky Mull, MD;  Location: [facility];  Service: Orthopedics;  Laterality: Left;   SHOULDER  ARTHROSCOPY WITH SUBACROMIAL DECOMPRESSION AND BICEP TENDON REPAIR Right 01/04/2019   Procedure: SHOULDER ARTHROSCOPY WITH DEBRIDEMENT AND SUBACROMIAL DECOMPRESSION-RIGHT;  Surgeon: Corky Mull, MD;  Location: [facility];  Service: Orthopedics;  Laterality: Right;   TUBAL LIGATION      Family History  Problem Relation Age of Onset   Hypertension Mother    Thyroid disease Mother    Lupus Mother    Congestive Heart Failure Mother    Hypertension Brother    CAD Maternal Grandmother    Breast cancer Neg Hx    Ovarian cancer Neg Hx    Colon cancer Neg Hx     Social History   Socioeconomic History   Marital status: Married    Spouse name: brian   Number of children: 7   Years of education: Not on file   Highest education level: Not on file  Occupational History   Occupation: disabled  Comment: disabled  Tobacco Use   Smoking status: Former    Packs/day: 2.00    Years: 25.00    Pack years: 50.00    Types: Cigarettes    Quit date: 06/09/2017    Years since quitting: 4.8   Smokeless tobacco: Never  Vaping Use   Vaping Use: Never used  Substance and Sexual Activity   Alcohol use: No   Drug use: Yes    Frequency: 7.0 times per week    Types: Marijuana    Comment: Pt. uses marjiuana only when she has severe back and side pain   Sexual activity: Yes    Partners: Male    Birth control/protection: None  Other Topics Concern   Not on file  Social History Narrative   Have 6 biological childrean and custody of niece since 56 months.   Social Determinants of Health   Financial Resource Strain: Not on file  Food Insecurity: Not on file  Transportation Needs: Not on file  Physical Activity: Not on file  Stress: Not on file  Social Connections: Not on file  Intimate Partner Violence: Not on file    Outpatient Medications Prior to Visit  Medication Sig Dispense Refill   docusate sodium (COLACE) 100 MG capsule Take 1 capsule (100 mg total) by mouth 2 (two) times daily as  needed. 30 capsule 2   gabapentin (NEURONTIN) 300 MG capsule Take 1 capsule (300 mg total) by mouth 3 (three) times daily. (Patient taking differently: Take 300 mg by mouth 3 (three) times daily as needed (pain).) 90 capsule 1   HYDROcodone-acetaminophen (NORCO/VICODIN) 5-325 MG tablet Take 1-2 tablets by mouth every 6 (six) hours as needed for moderate pain. 20 tablet 0   Insulin Syringe-Needle U-100 (INSULIN SYRINGE 1CC/31GX5/16") 31G X 5/16" 1 ML MISC Use 4 times daily as directed. 100 each 3   naproxen sodium (ALEVE) 220 MG tablet Take 220 mg by mouth daily as needed (pain).     ondansetron (ZOFRAN) 4 MG tablet Take 1 tablet by mouth every 8 (eight) hours as needed for vomiting or nausea.     simethicone (GAS-X) 80 MG chewable tablet Chew 1 tablet (80 mg total) by mouth 4 (four) times daily as needed for flatulence. 100 tablet 2   tiZANidine (ZANAFLEX) 4 MG tablet Take 1 tablet (4 mg total) by mouth every 6 (six) hours as needed for muscle spasms. 30 tablet 0   albuterol (VENTOLIN HFA) 108 (90 Base) MCG/ACT inhaler Inhale 2 puffs into the lungs every 6 (six) hours as needed for wheezing or shortness of breath. 18 g 3   Dulaglutide (TRULICITY) 9.98 PJ/8.2NK SOPN Inject 0.75 mg into the skin once a week. 2 mL 3   insulin glargine (LANTUS) 100 UNIT/ML injection Inject 1 mL (100 Units total) into the skin daily. (Patient taking differently: Inject 100 Units into the skin every evening.) 10 mL 3   insulin lispro (HUMALOG) 100 UNIT/ML injection Inject 0.3 mLs (30 Units total) into the skin 3 (three) times daily before meals. 10 mL 3   lisinopril (ZESTRIL) 40 MG tablet Take 40 mg by mouth at bedtime.     ACCU-CHEK GUIDE test strip USE TO CHECK BLOOD SUGAR UP TO FOUR TIMES DAILY AS DIRECTED (Patient not taking: Reported on 04/26/2022) 100 each 3   blood glucose meter kit and supplies Dispense based on patient and insurance preference. Use up to four times daily as directed. (FOR ICD-10 E10.9, E11.9).  (Patient not taking: Reported on 04/26/2022) 1  each 3   Lancets (ACCU-CHEK MULTICLIX) lancets Use as instructed (Patient not taking: Reported on 04/26/2022) 100 each 12   metroNIDAZOLE (FLAGYL) 500 MG tablet Take 1 tablet (500 mg total) by mouth 2 (two) times daily. (Patient not taking: Reported on 04/26/2022) 12 tablet 0   atorvastatin (LIPITOR) 40 MG tablet Take 1 tablet (40 mg total) by mouth daily. (Patient not taking: Reported on 03/04/2022) 90 tablet 3   No facility-administered medications prior to visit.    Allergies  Allergen Reactions   Dilaudid [Hydromorphone Hcl] Nausea And Vomiting   Morphine And Related Nausea And Vomiting   Tramadol Nausea Only    If she takes antinausea medicine with this medicine, then she can tolerate it.    ROS Review of Systems  Constitutional:  Negative for chills and fever.  Eyes:  Negative for visual disturbance.  Respiratory:  Negative for cough and shortness of breath.   Cardiovascular:  Negative for chest pain.  Gastrointestinal:  Negative for abdominal pain, diarrhea, nausea and vomiting.  Neurological:  Negative for dizziness and headaches.     Objective:    Physical Exam Constitutional:      Appearance: Normal appearance. She is obese.  HENT:     Head: Normocephalic and atraumatic.  Eyes:     Conjunctiva/sclera: Conjunctivae normal.  Cardiovascular:     Rate and Rhythm: Normal rate and regular rhythm.  Pulmonary:     Effort: Pulmonary effort is normal.     Breath sounds: Normal breath sounds.  Musculoskeletal:     Right lower leg: No edema.     Left lower leg: No edema.  Skin:    General: Skin is warm and dry.  Neurological:     General: No focal deficit present.     Mental Status: She is alert. Mental status is at baseline.  Psychiatric:        Mood and Affect: Mood normal.        Behavior: Behavior normal.    BP 132/80   Pulse 89   Temp 98.2 F (36.8 C) (Oral)   Resp 16   Ht '5\' 6"'  (1.676 m)   Wt (!) 307 lb 14.4 oz  (139.7 kg)   LMP 02/22/2022 (Approximate)   SpO2 97%   BMI 49.70 kg/m  Wt Readings from Last 3 Encounters:  04/26/22 (!) 307 lb 14.4 oz (139.7 kg)  03/31/22 (!) 309 lb 8 oz (140.4 kg)  03/15/22 300 lb (136.1 kg)     Health Maintenance Due  Topic Date Due   MAMMOGRAM  Never done   Zoster Vaccines- Shingrix (1 of 2) Never done   FOOT EXAM  01/14/2022    There are no preventive care reminders to display for this patient.  Lab Results  Component Value Date   TSH 1.614 03/15/2022   Lab Results  Component Value Date   WBC 6.6 03/15/2022   HGB 13.6 03/15/2022   HCT 43.4 03/15/2022   MCV 94.8 03/15/2022   PLT 268 03/15/2022   Lab Results  Component Value Date   NA 137 03/15/2022   K 3.9 03/15/2022   CO2 30 03/15/2022   GLUCOSE 167 (H) 03/15/2022   BUN 15 03/15/2022   CREATININE 0.96 03/15/2022   BILITOT 0.7 03/15/2022   ALKPHOS 71 03/15/2022   AST 16 03/15/2022   ALT 17 03/15/2022   PROT 8.0 03/15/2022   ALBUMIN 4.1 03/15/2022   CALCIUM 9.0 03/15/2022   ANIONGAP 9 03/15/2022   EGFR 79 01/18/2022  Lab Results  Component Value Date   CHOL 185 12/14/2021   Lab Results  Component Value Date   HDL 41 (L) 12/14/2021   Lab Results  Component Value Date   LDLCALC 122 (H) 12/14/2021   Lab Results  Component Value Date   TRIG 109 12/14/2021   Lab Results  Component Value Date   CHOLHDL 4.5 12/14/2021   Lab Results  Component Value Date   HGBA1C 7.8 (H) 03/15/2022      Assessment & Plan:   Problem List Items Addressed This Visit       Cardiovascular and Mediastinum   Essential hypertension    Has been out of medications for 1-2 weeks, blood pressure 132/80 here today. Not checking at home. Refill Lisinopril 40 mg to take daily, start checking BP 2-3 times a week at home.        Relevant Medications   lisinopril (ZESTRIL) 40 MG tablet        Respiratory   Airway hyperreactivity    Chronic and stable, Albuterol PRN refilled today. Pollen  making symptoms slightly worse.        Relevant Medications   albuterol (VENTOLIN HFA) 108 (90 Base) MCG/ACT inhaler     Endocrine   Type 2 diabetes mellitus ([place]) - Primary    A1c in the hospital 7.8% in April which is an improvement but has been out of medications for 1-2 weeks.Refill Lantus 100 units at night, Humalog 30 units at meals and Truclity 0.75 mg weekly. She is not checking blood sugars right now, she was given 2 samples of Free Style Libre 2 and a prescription for the Free Style Libre 3 was sent to her pharmacy. She needs to start checking sugars 4 times a day in the next few weeks as she is restarting her insulin and working on losing weight. Follow up in 3 months.        Relevant Medications      Dulaglutide (TRULICITY) 1.61 WR/6.0AV SOPN   insulin glargine (LANTUS) 100 UNIT/ML injection   insulin lispro (HUMALOG) 100 UNIT/ML injection   Continuous Blood Gluc Sensor (FREESTYLE LIBRE 3 SENSOR) MISC            Hyperlipidemia associated with type 2 diabetes mellitus (HCC)    Chronic, refill Lipitor 40 mg daily today. Working on weight loss and lifestyle modification.                          Follow-up: Return in about 3 months (around 07/27/2022).    [name], DO

## 2022-04-26 NOTE — Assessment & Plan Note (Signed)
Has been out of medications for 1-2 weeks, blood pressure 132/80 here today. Not checking at home. Refill Lisinopril 40 mg to take daily, start checking BP 2-3 times a week at home.

## 2022-04-26 NOTE — Assessment & Plan Note (Signed)
Chronic, refill Lipitor 40 mg daily today. Working on weight loss and lifestyle modification.

## 2022-04-26 NOTE — Patient Instructions (Signed)
It was great seeing you today!  Plan discussed at today's visit: -Medications refilled today -Samples of Free Style Libre 2 given today, order placed for Free Style Libre 3 to pharmacy -Continue to check blood sugar 4 times a day and blood pressure at home as well -Continue to work on weight loss  -Please call number on card given to schedule mammogram   Follow up in: 3 months   Take care and let us know if you have any questions or concerns prior to your next visit.  Dr. Rosana Berger

## 2022-04-26 NOTE — Assessment & Plan Note (Signed)
A1c in the hospital 7.8% in April which is an improvement but has been out of medications for 1-2 weeks.Refill Lantus 100 units at night, Humalog 30 units at meals and Truclity 0.75 mg weekly. She is not checking blood sugars right now, she was given 2 samples of Free Style Libre 2 and a prescription for the Free Style Libre 3 was sent to her pharmacy. She needs to start checking sugars 4 times a day in the next few weeks as she is restarting her insulin and working on losing weight. Follow up in 3 months.

## 2022-04-26 NOTE — Assessment & Plan Note (Signed)
Chronic and stable, Albuterol PRN refilled today. Pollen making symptoms slightly worse.

## 2022-04-27 ENCOUNTER — Ambulatory Visit (INDEPENDENT_AMBULATORY_CARE_PROVIDER_SITE_OTHER): Payer: Medicaid Other | Admitting: Obstetrics and Gynecology

## 2022-04-27 ENCOUNTER — Encounter: Payer: Self-pay | Admitting: Obstetrics and Gynecology

## 2022-04-27 VITALS — BP 134/72 | HR 71 | Resp 16 | Ht 66.0 in | Wt 309.1 lb

## 2022-04-27 DIAGNOSIS — K5900 Constipation, unspecified: Secondary | ICD-10-CM

## 2022-04-27 DIAGNOSIS — Z90710 Acquired absence of both cervix and uterus: Secondary | ICD-10-CM

## 2022-04-27 DIAGNOSIS — Z09 Encounter for follow-up examination after completed treatment for conditions other than malignant neoplasm: Secondary | ICD-10-CM

## 2022-04-27 MED ORDER — POLYETHYLENE GLYCOL 3350 17 G PO PACK
17.0000 g | PACK | Freq: Every day | ORAL | 0 refills | Status: DC
Start: 2022-04-27 — End: 2023-02-07

## 2022-05-20 ENCOUNTER — Telehealth: Payer: Self-pay | Admitting: Obstetrics and Gynecology

## 2022-05-20 NOTE — Telephone Encounter (Signed)
Attempted to contact pt about recent surgery, phone number is invalid- sent mychart message for pt to call back and to update best contact #
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
## 2022-05-25 IMAGING — US US EXTREM LOW VENOUS*L*
1 series · 14 of 24 positions shown · non-contrast
Comparison: None

CLINICAL DATA: Pain and swelling LEFT lower leg

EXAM:
LEFT LOWER EXTREMITY VENOUS DOPPLER ULTRASOUND
TECHNIQUE: Gray-scale sonography with compression, as well as color and duplex
ultrasound, were performed to evaluate the deep venous system(s)
from the level of the common femoral vein through the popliteal and
proximal calf veins.

[Series 1: us venous img lower uni left (dvt) · portal-venous · 14 of 33 slices shown]
[im 1/33]
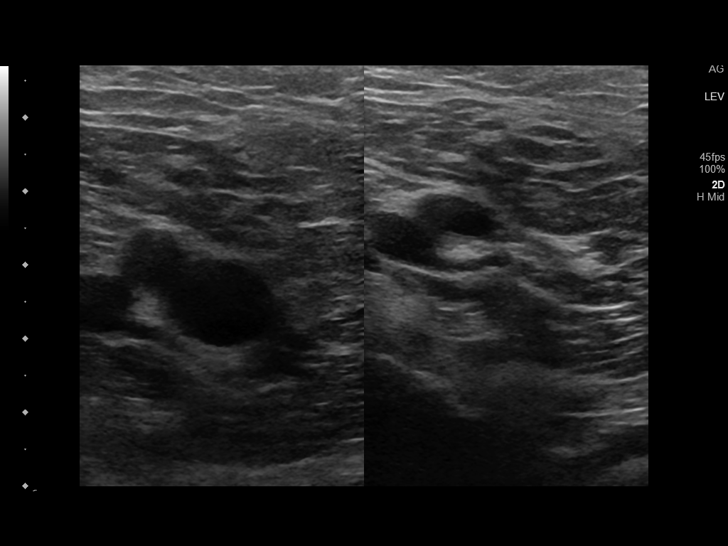
[im 3/33]
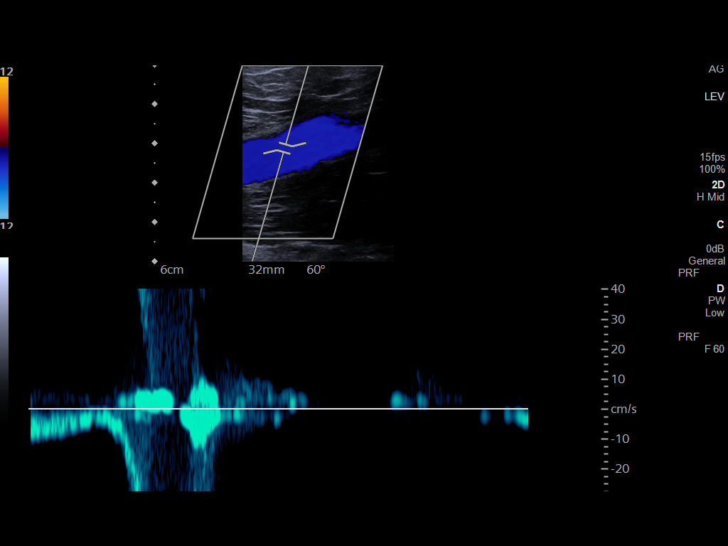
[im 6/33]
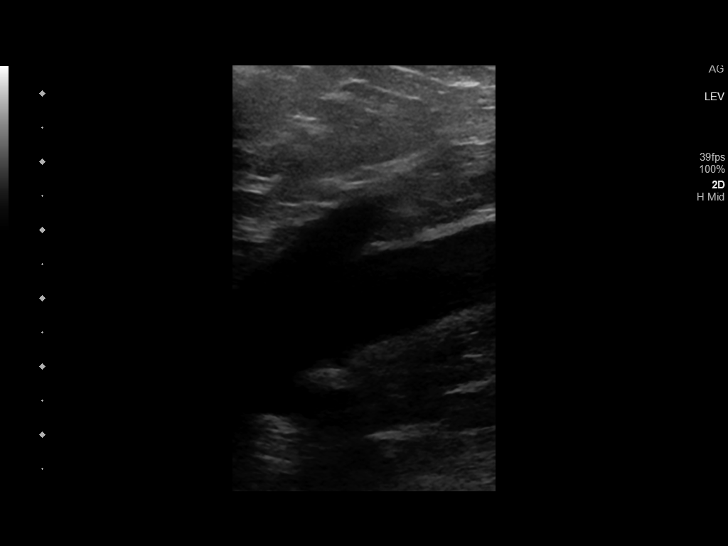
[im 9/33]
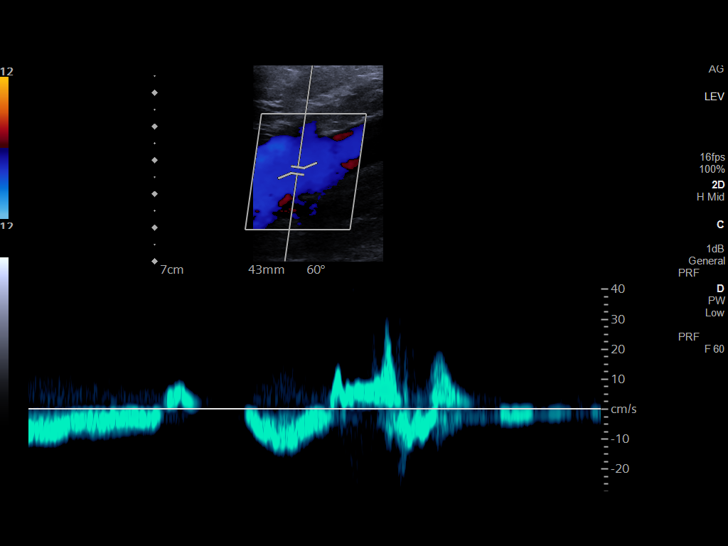
[im 10/33]
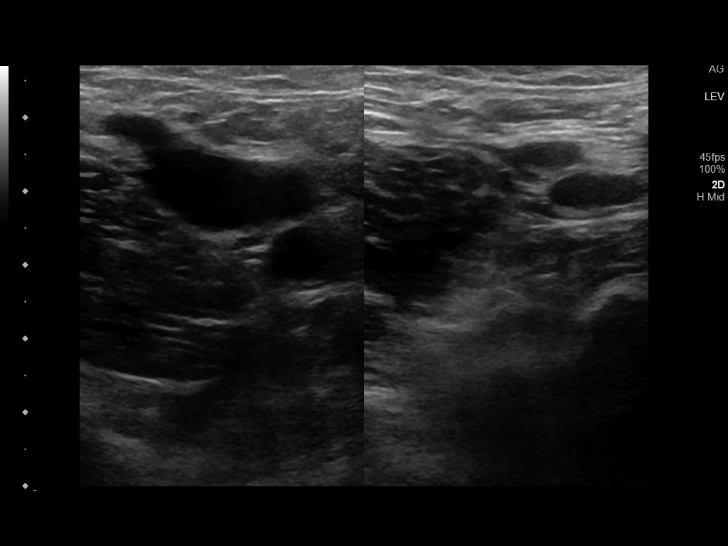
[im 13/33]
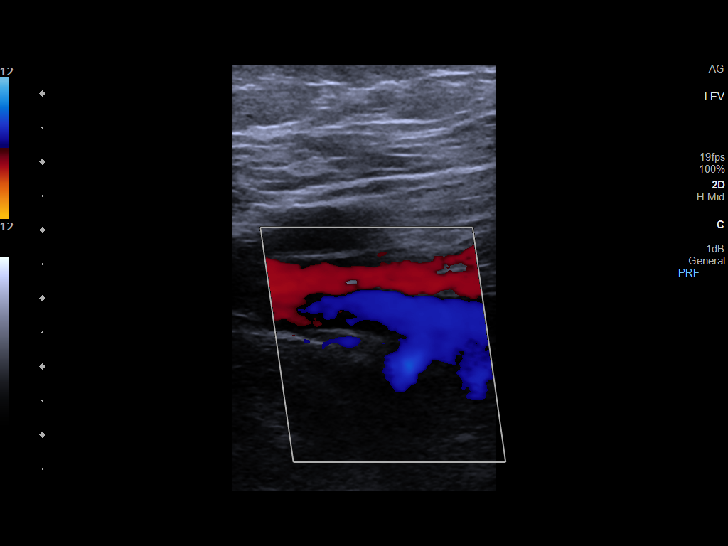
[im 16/33]
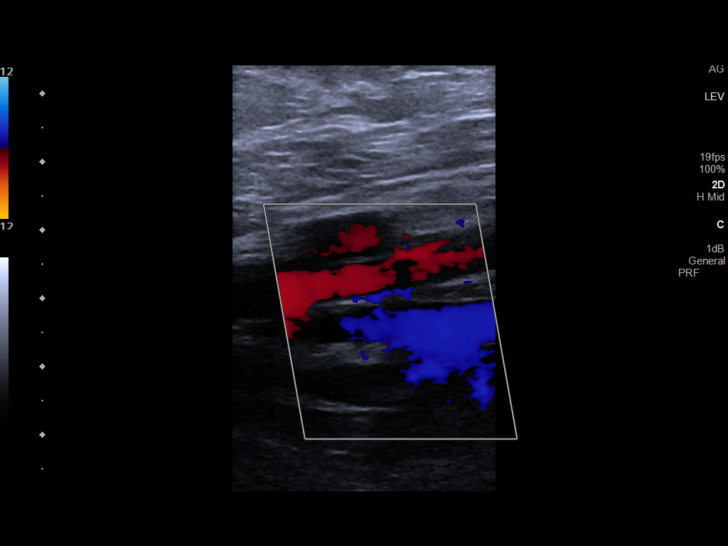
[im 17/33]
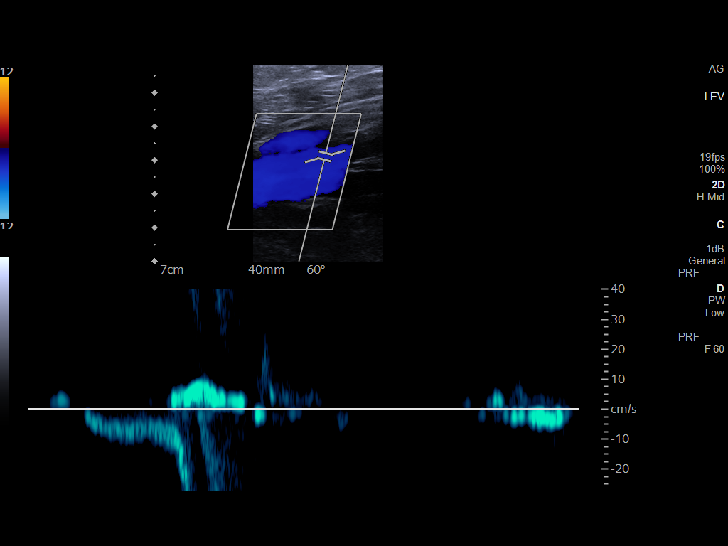
[im 20/33]
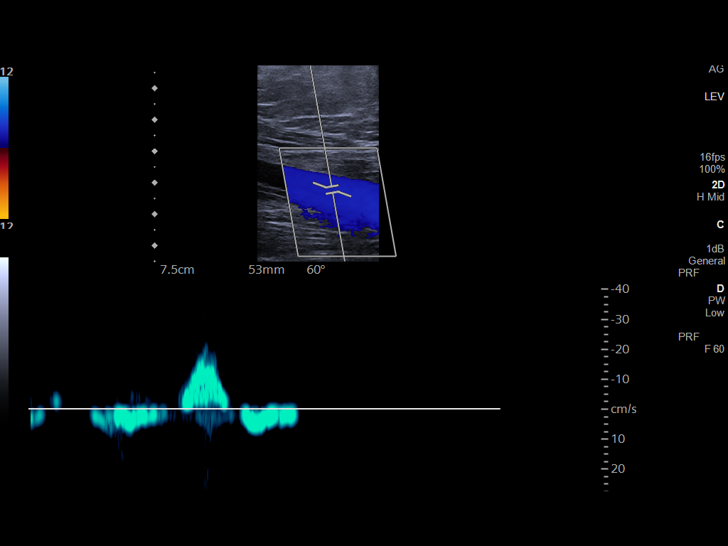
[im 23/33]
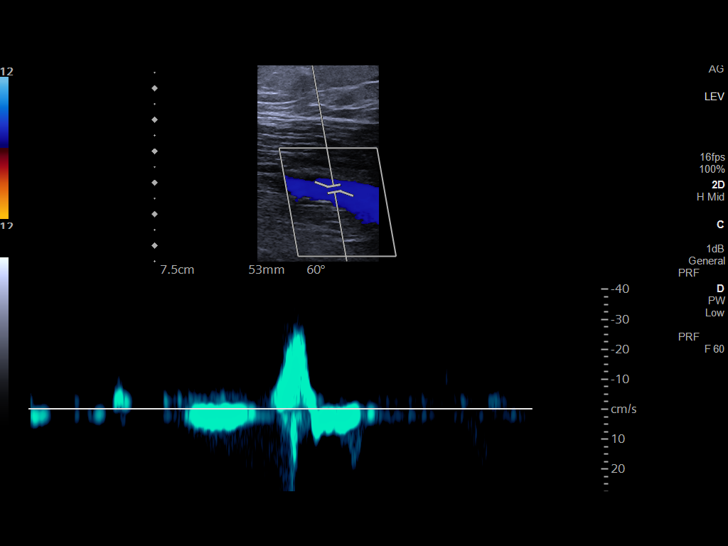
[im 26/33]
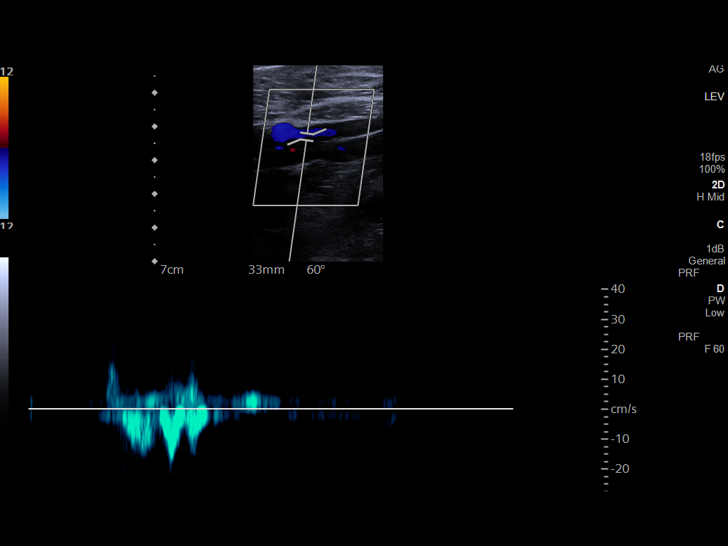
[im 27/33]
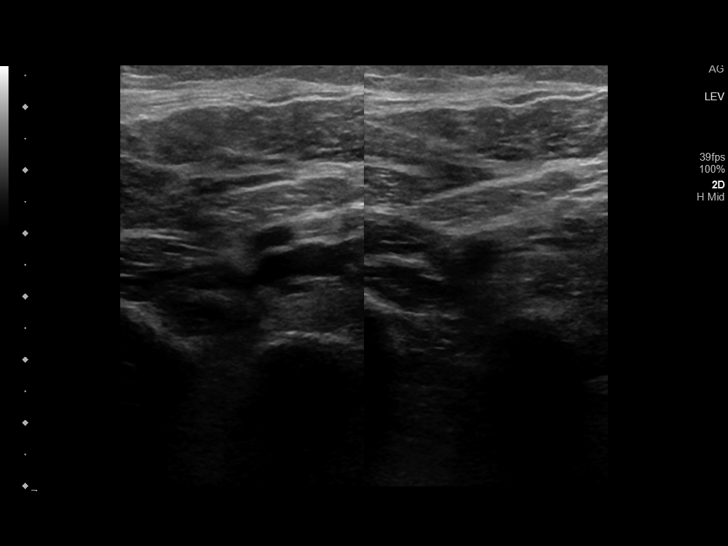
[im 30/33]
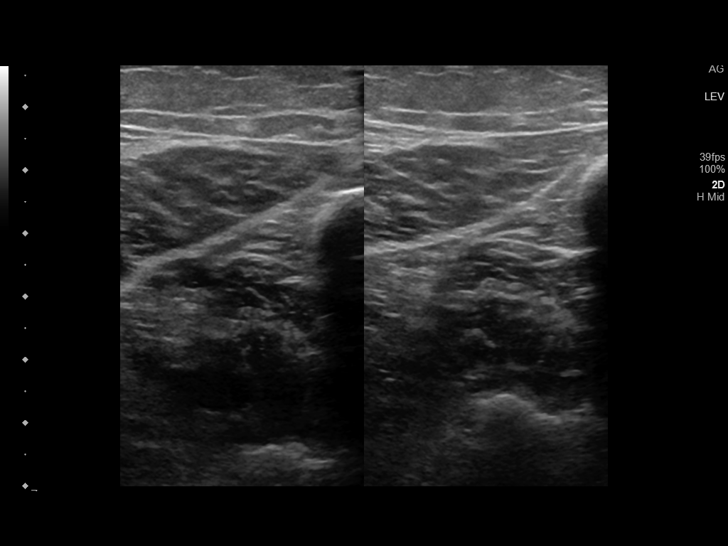
[im 33/33]
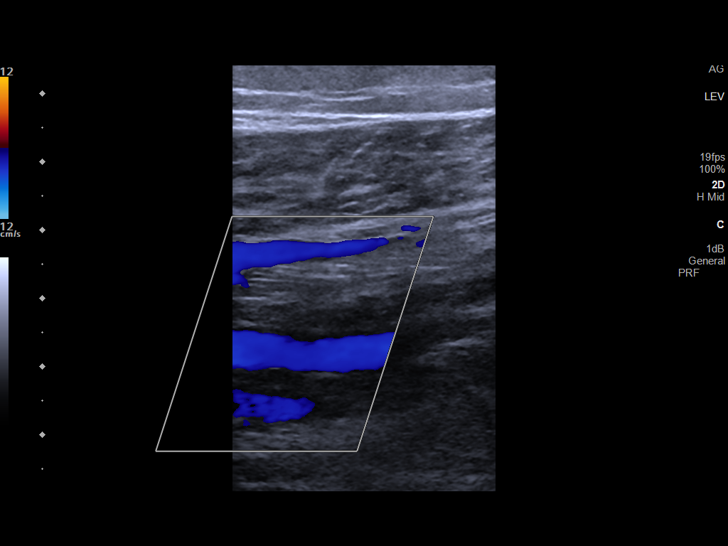

[14 of 24 positions shown; findings below may reference images not displayed]

FINDINGS: VENOUS

Normal compressibility of the common femoral, superficial femoral,
and popliteal veins, as well as the visualized calf veins.
Visualized portions of profunda femoral vein and great saphenous
vein unremarkable. No filling defects to suggest DVT on grayscale or
color Doppler imaging. Doppler waveforms show normal direction of
venous flow, normal respiratory plasticity and response to
augmentation.

Limited views of the contralateral common femoral vein are
unremarkable.

OTHER

None.

Limitations: none
IMPRESSION: No evidence of deep venous thrombosis in LEFT lower extremity.

## 2022-05-28 ENCOUNTER — Encounter: Payer: Self-pay | Admitting: Obstetrics and Gynecology

## 2022-06-08 ENCOUNTER — Other Ambulatory Visit: Payer: Self-pay

## 2022-06-08 DIAGNOSIS — E1165 Type 2 diabetes mellitus with hyperglycemia: Secondary | ICD-10-CM

## 2022-06-08 MED ORDER — TRULICITY 0.75 MG/0.5ML ~~LOC~~ SOAJ: 0.7500 mg | SUBCUTANEOUS | 3 refills | Status: DC

## 2022-06-21 ENCOUNTER — Encounter: Payer: Self-pay | Admitting: Emergency Medicine

## 2022-06-21 ENCOUNTER — Emergency Department
Admission: EM | Admit: 2022-06-21 | Discharge: 2022-06-21 | Disposition: A | Discharge status: Home / Self Care | Payer: Medicaid Other | Attending: Emergency Medicine | Admitting: Emergency Medicine

## 2022-06-21 ENCOUNTER — Other Ambulatory Visit: Payer: Self-pay

## 2022-06-21 DIAGNOSIS — N189 Chronic kidney disease, unspecified: Secondary | ICD-10-CM | POA: Diagnosis not present

## 2022-06-21 DIAGNOSIS — R1010 Upper abdominal pain, unspecified: Secondary | ICD-10-CM | POA: Diagnosis present

## 2022-06-21 DIAGNOSIS — R079 Chest pain, unspecified: Secondary | ICD-10-CM | POA: Insufficient documentation

## 2022-06-21 DIAGNOSIS — I129 Hypertensive chronic kidney disease with stage 1 through stage 4 chronic kidney disease, or unspecified chronic kidney disease: Secondary | ICD-10-CM | POA: Diagnosis not present

## 2022-06-21 DIAGNOSIS — J45909 Unspecified asthma, uncomplicated: Secondary | ICD-10-CM | POA: Insufficient documentation

## 2022-06-21 LAB — COMPREHENSIVE METABOLIC PANEL
ALT: 20 U/L (ref 0–44)
AST: 21 U/L (ref 15–41)
Albumin: 3.8 g/dL (ref 3.5–5.0)
Alkaline Phosphatase: 73 U/L (ref 38–126)
Anion gap: 10 (ref 5–15)
BUN: 17 mg/dL (ref 6–20)
CO2: 26 mmol/L (ref 22–32)
Calcium: 9 mg/dL (ref 8.9–10.3)
Chloride: 103 mmol/L (ref 98–111)
Creatinine, Ser: 0.99 mg/dL (ref 0.44–1.00)
GFR, Estimated: >60 mL/min (ref >60)
Glucose, Bld: 158 mg/dL — ABNORMAL HIGH (ref 70–99)
Potassium: 4.4 mmol/L (ref 3.5–5.1)
Sodium: 139 mmol/L (ref 135–145)
Total Bilirubin: 0.5 mg/dL (ref 0.3–1.2)
Total Protein: 7.6 g/dL (ref 6.5–8.1)

## 2022-06-21 LAB — CBC
HCT: 44 % (ref 36.0–46.0)
Hemoglobin: 13.7 g/dL (ref 12.0–15.0)
MCH: 29.3 pg (ref 26.0–34.0)
MCHC: 31.1 g/dL (ref 30.0–36.0)
MCV: 94 fL (ref 80.0–100.0)
Platelets: 246 10*3/uL (ref 150–400)
RBC: 4.68 MIL/uL (ref 3.87–5.11)
RDW: 13.7 % (ref 11.5–15.5)
WBC: 5.5 10*3/uL (ref 4.0–10.5)
nRBC: 0 % (ref 0.0–0.2)

## 2022-06-21 LAB — URINALYSIS, ROUTINE W REFLEX MICROSCOPIC
Bilirubin Urine: NEGATIVE
Glucose, UA: NEGATIVE mg/dL
Hgb urine dipstick: NEGATIVE
Ketones, ur: NEGATIVE mg/dL
Leukocytes,Ua: NEGATIVE
Nitrite: NEGATIVE
Protein, ur: NEGATIVE mg/dL
Specific Gravity, Urine: 1.016 (ref 1.005–1.030)
pH: 6 (ref 5.0–8.0)

## 2022-06-21 LAB — LIPASE, BLOOD: Lipase: 46 U/L (ref 11–51)

## 2022-06-21 LAB — TROPONIN I (HIGH SENSITIVITY): Troponin I (High Sensitivity): 4 ng/L (ref <18)

## 2022-06-21 MED ORDER — SUCRALFATE 1 G PO TABS: 1.0000 g | ORAL_TABLET | Freq: Four times a day (QID) | ORAL | 0 refills | Status: DC

## 2022-06-21 MED ORDER — PANTOPRAZOLE SODIUM 40 MG PO TBEC: 40.0000 mg | DELAYED_RELEASE_TABLET | Freq: Every day | ORAL | 1 refills | Status: DC

## 2022-06-21 NOTE — ED Triage Notes (Signed)
Says pain in ribs with breathing and feels like hard to breaht.  Neck, shoulders hurt and stomach hurts.  Nausea.  Had hystorectomy about 1.5 month ago.  Getting worse since than.

## 2022-06-21 NOTE — ED Provider Notes (Signed)
[facility] Provider Note    Event Date/Time   First MD Initiated Contact with Patient 06/21/22 [phone]     (approximate)  History   Chief Complaint: Abdominal Pain and Chest Pain  HPI  Brenda Hicks is a 56 y.o. female with a past medical history of CKD, hypertension, asthma, arthritis, presents to the emergency department 6 weeks after a hysterectomy for multiple complaints.  According to the patient over the past 6 weeks since having her hysterectomy she has noticed a pressure sensation in the lower abdomen whenever she urinates and states at times it will only be dripping out of her.  Denies any dysuria however and denies any foul smell or dark urine.  Patient also states she is experiencing some discomfort with intercourse.  States she is talked to her OB/GYN about both these issues and was told that they should improve over time.  Patient also states over the past 6 weeks she has been experiencing intermittent upper abdominal discomfort mostly when eating.  No vomiting, no pain in the chest mostly in the epigastrium.  Does state nausea.  Physical Exam   Triage Vital Signs: ED Triage Vitals [06/21/22 0852]  Enc Vitals Group     BP      Pulse      Resp      Temp      Temp src      SpO2      Weight (!) 318 lb (144.2 kg)     Height '5\' 6"'$  (1.676 m)     Head Circumference      Peak Flow      Pain Score 10     Pain Loc      Pain Edu?      Excl. in [place]?     Most recent vital signs: There were no vitals filed for this visit.  General: Awake, no distress.  CV:  Good peripheral perfusion.  Regular rate and rhythm  Resp:  Normal effort.  Equal breath sounds bilaterally.  Abd:  No distention.  Soft, nontender.  No rebound or guarding.   ED Results / Procedures / Treatments   EKG  EKG viewed and interpreted by myself shows a normal sinus rhythm at 73 bpm with a narrow QRS, normal axis, nonspecific ST changes without ST elevation.  MEDICATIONS ORDERED  IN ED: Medications - No data to display   IMPRESSION / MDM / Ages / ED COURSE  I reviewed the triage vital signs and the nursing notes.  Patient's presentation is most consistent with acute presentation with potential threat to life or bodily function.  Patient presents emergency department multiple complaints.  Patient states for the last 6 weeks she has been experiencing a pressure sensation to lower abdomen especially when she urinates.  We will check labs and a urinalysis.  Patient also states over the past 6 weeks she has intermittently been experiencing a pressure sensation across the upper abdomen mostly when she eats.  Patient still has her gallbladder we will check LFTs and a lipase.  Given the upper abdominal discomfort we will also obtain an EKG and troponin as a precaution.  Patient agreeable to plan of care.  Overall the patient appears well, no distress.  Differential would include reflux, biliary colic, gastritis, pancreatitis, UTI, postsurgical pain.  Patient's work-up is overall reassuring.  Normal chemistry including LFTs and renal function.  Normal CBC.  Negative/normal lipase, normal troponin.  Urinalysis appears normal as well.  Given the patient's reassuring work-up I discussed with the patient following up with her OB/GYN regarding her painful intercourse and urinary pressure symptoms.  Given the upper abdominal discomfort and pressure with eating patient states she has had gastric ulcers in the past which this feels similar.  We will treat with Protonix and sucralfate have the patient follow-up with GI medicine.  Patient agreeable to plan of care.  FINAL CLINICAL IMPRESSION(S) / ED DIAGNOSES   Abdominal pain  Note:  This document was prepared using Dragon voice recognition software and may include unintentional dictation errors.   Harvest Dark, MD 06/21/22 1101

## 2022-06-21 NOTE — ED Notes (Signed)
See triage note.  Presents with pressure in lower abd   states she is also having some problems with urination   Feels like she is not able to empty her bladder  Also having some discomfort in upper abd and chest  describes as "gas"  Denies any fever or n/v

## 2022-06-30 IMAGING — DX DG KNEE COMPLETE 4+V*L*
4 series · 4 of 4 positions shown · non-contrast
Comparison: 08/12/2021

CLINICAL DATA: LEFT knee pain post fall

EXAM:
LEFT KNEE - COMPLETE 4+ VIEW

[knee ap]
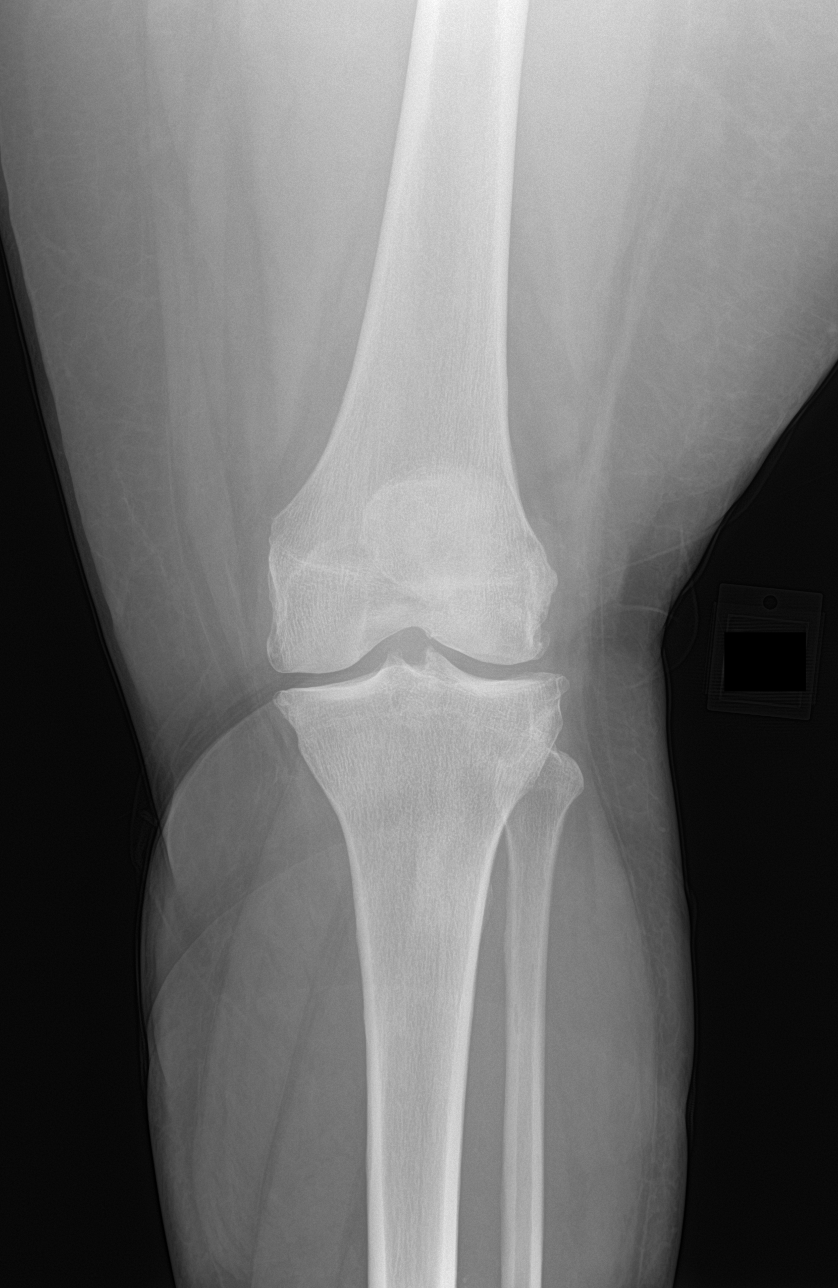

[knee lat]
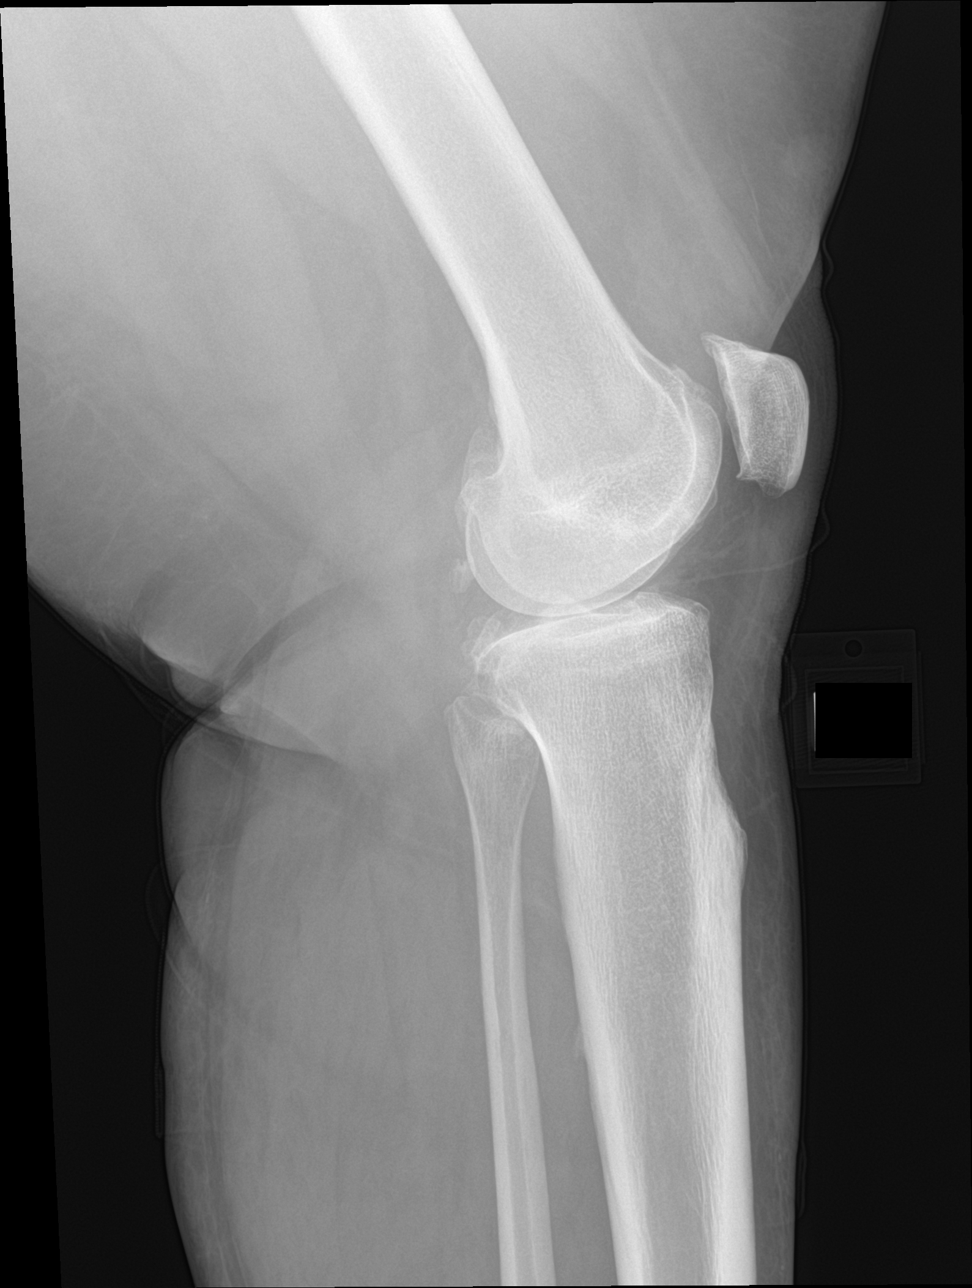

[knee obl (1 of 2)]
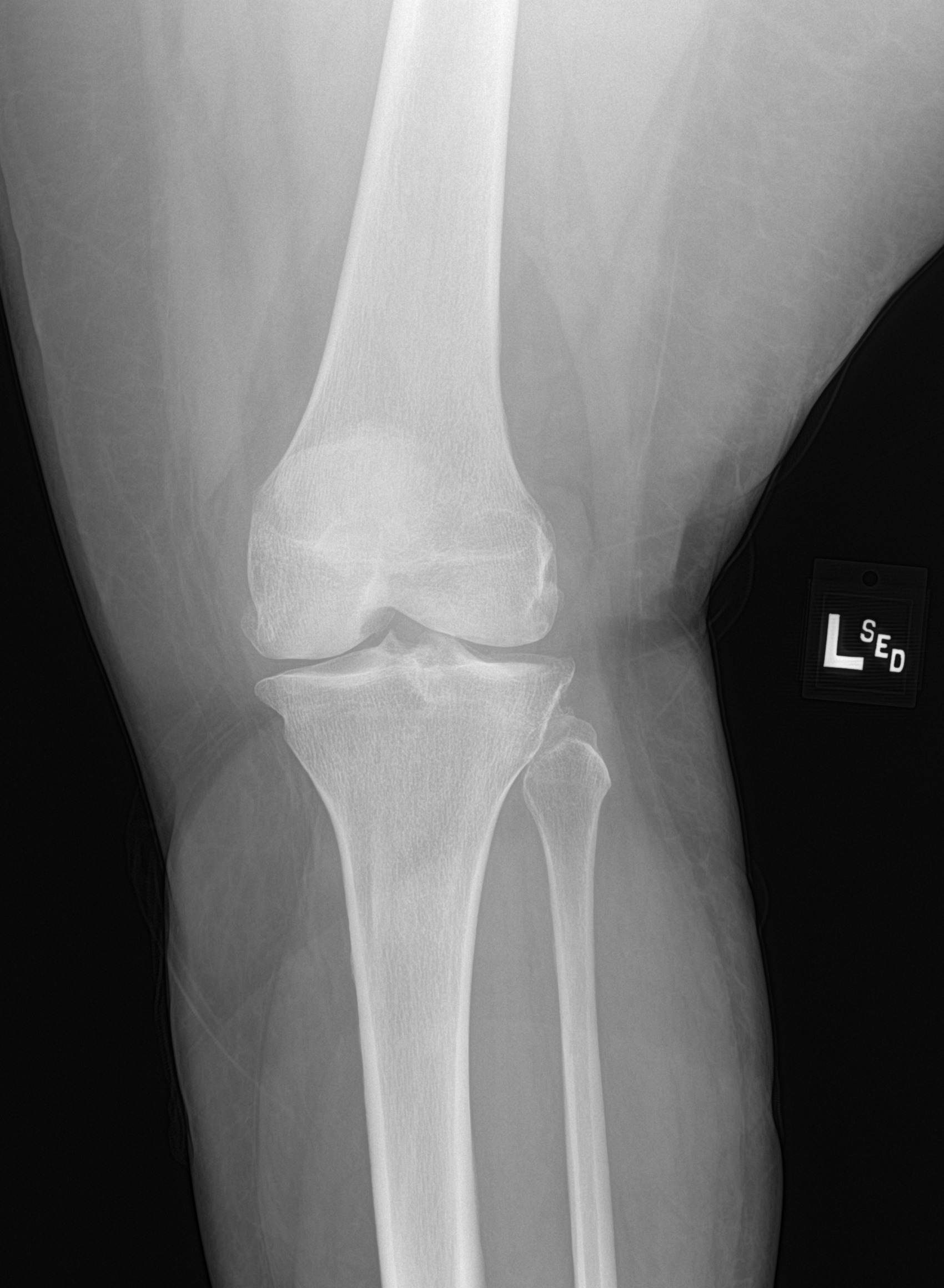

[knee obl (2 of 2)]
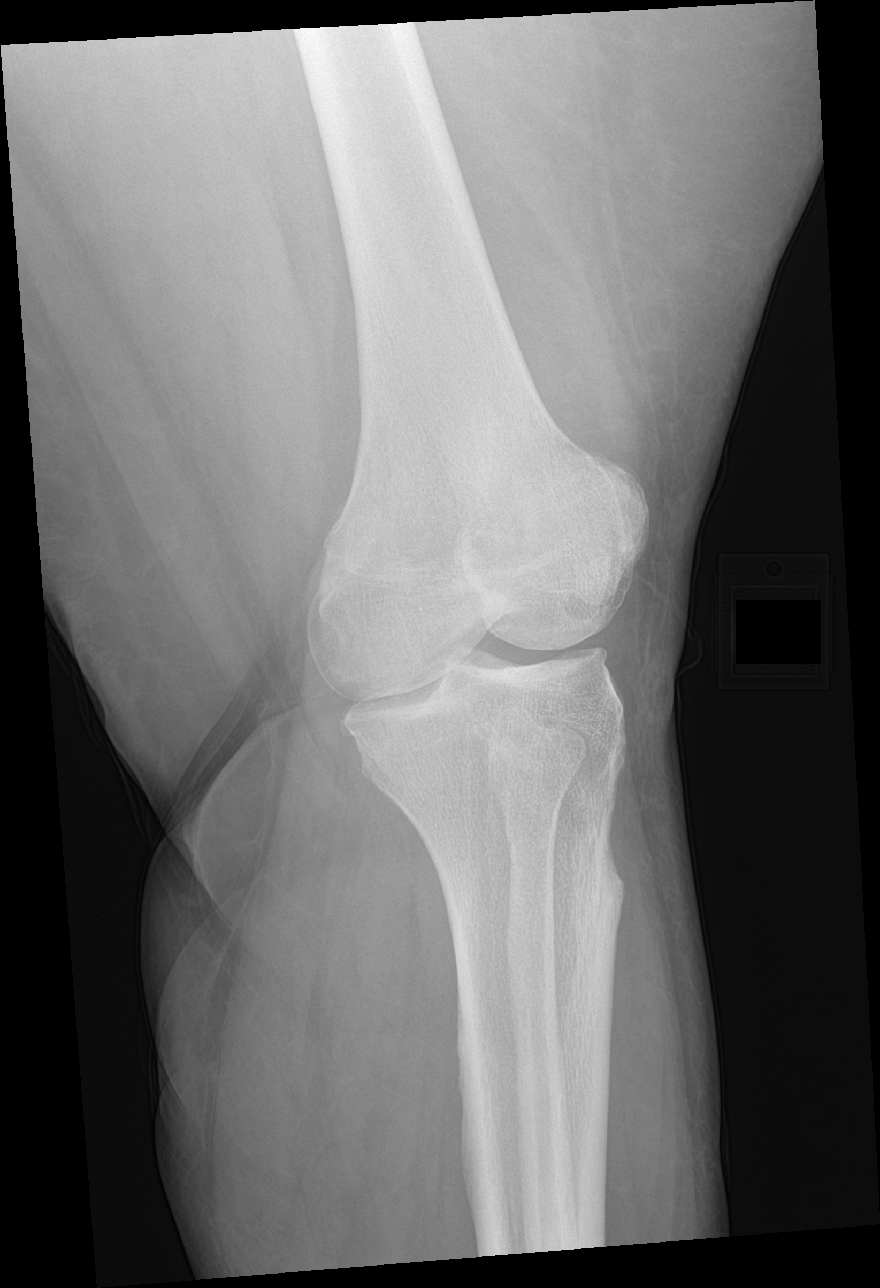

[4 of 4 positions shown; findings below may reference images not displayed]

FINDINGS: Osseous mineralization normal.

Tricompartmental osteoarthritic changes with joint space narrowing
and spur formation.

No joint effusion.

No acute fracture, dislocation, or bone destruction.
IMPRESSION: Tricompartmental osteoarthritic changes.

No acute abnormalities or change since previous exam.

## 2022-06-30 IMAGING — DX DG WRIST COMPLETE 3+V*L*
4 series · 4 of 4 positions shown · non-contrast
Comparison: None

CLINICAL DATA: Pain post fall

EXAM:
LEFT WRIST - COMPLETE 3+ VIEW

[wrist ap (1 of 2)]
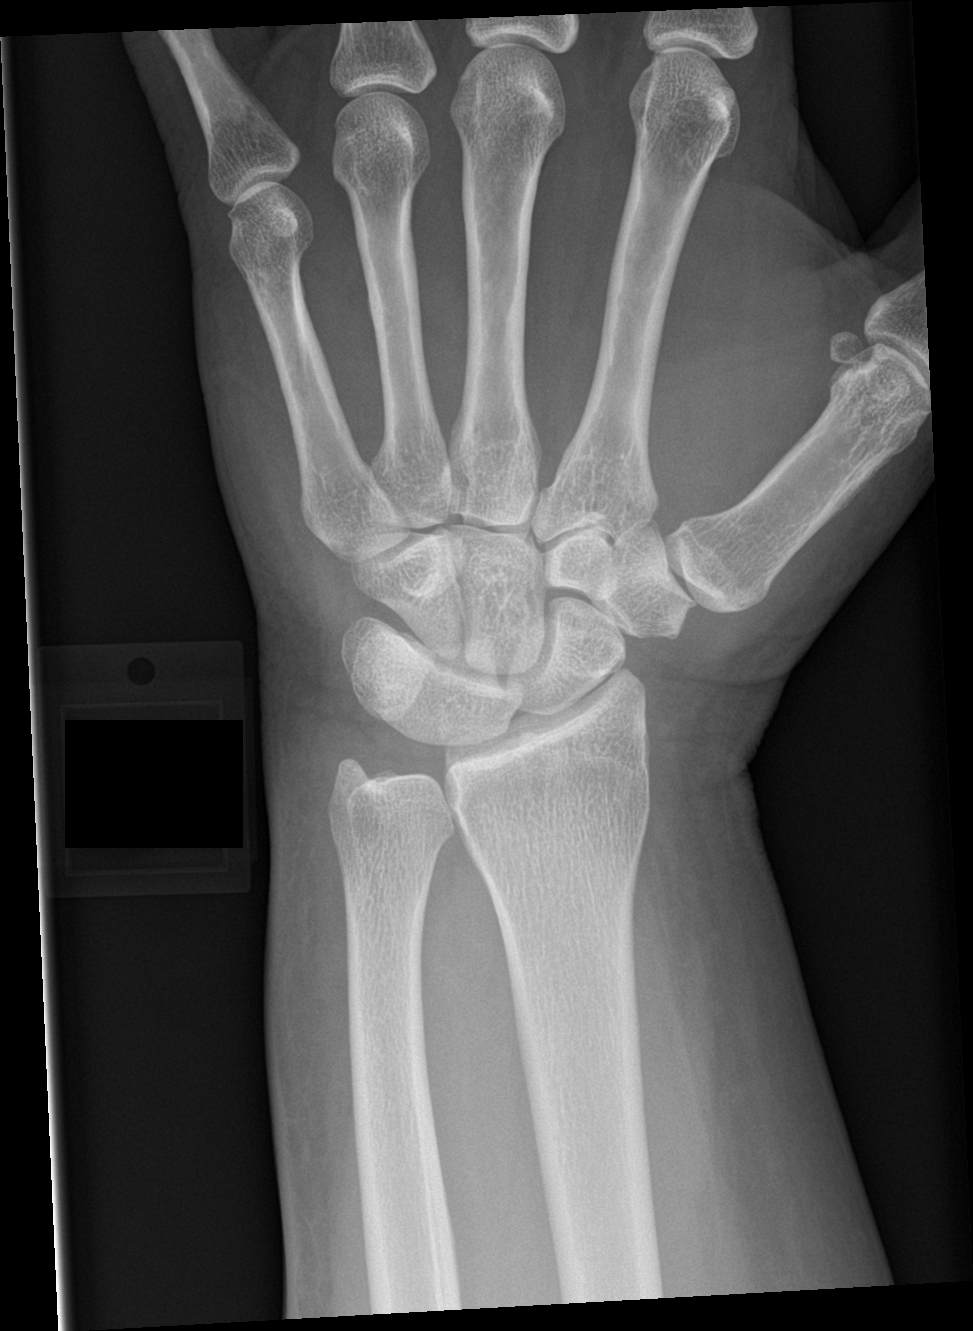

[wrist obl]
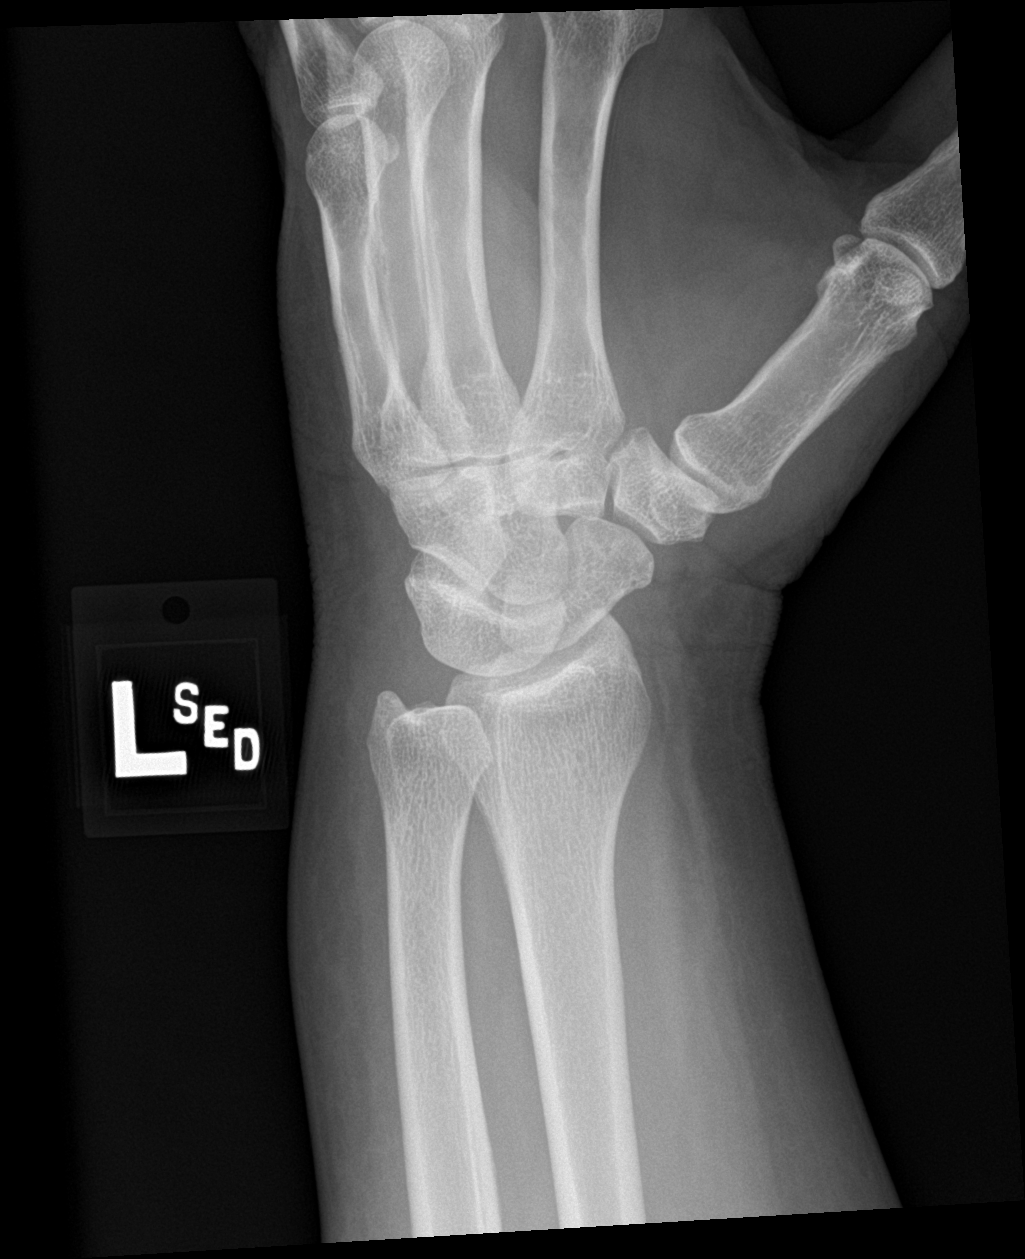

[wrist lat]
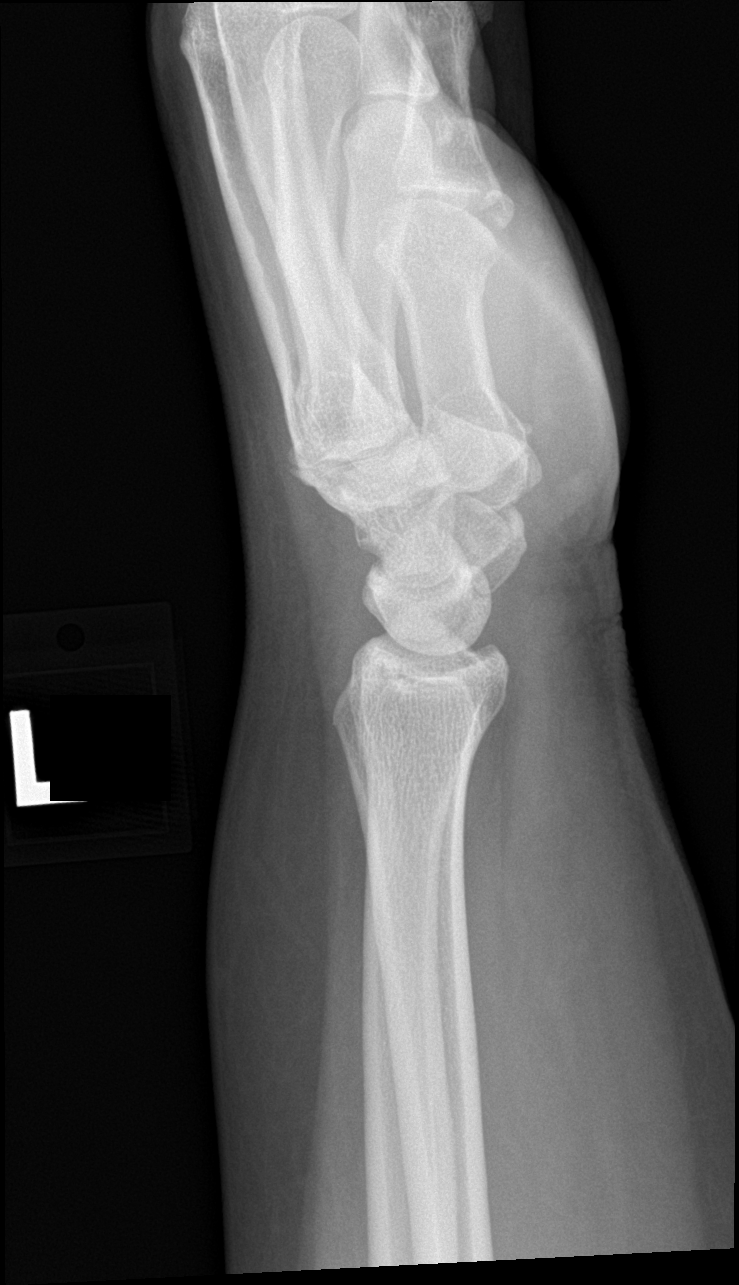

[wrist ap (2 of 2)]
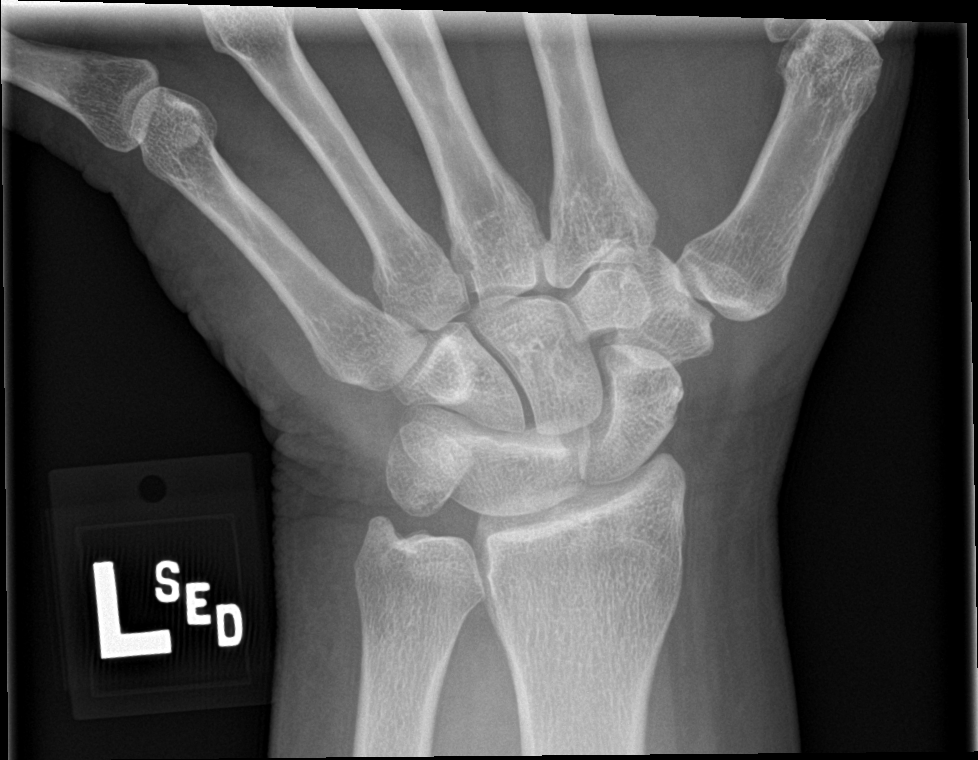

[4 of 4 positions shown; findings below may reference images not displayed]

FINDINGS: Osseous mineralization normal.

Congenital lunatotriquetral fusion.

Remaining joint spaces preserved.

No acute fracture, dislocation, or bone destruction.
IMPRESSION: No acute abnormalities.

## 2022-07-06 ENCOUNTER — Ambulatory Visit: Payer: Self-pay

## 2022-07-06 NOTE — Telephone Encounter (Signed)
When receiving this encounter I tried calling the patient 2 separate times to get an appt for the patient and it said call can not be completed at this time. Try the call later.

## 2022-07-06 NOTE — Telephone Encounter (Signed)
    Chief Complaint: Upper abdominal pain. Seen in ED 06/21/22. Symptoms: Pain, nausea Frequency: 3 weeks ago Pertinent Negatives: Patient denies fever Disposition: '[]'$ ED /'[]'$ Urgent Care (no appt availability in office) / '[]'$ Appointment(In office/virtual)/ '[]'$  [facility]/ '[]'$ Home Care/ '[]'$ Refused Recommended Disposition /'[]'$ [facility]/ '[x]'$  Follow-up with PCP Additional Notes: Requesting referral to GI. Please advise  Answer Assessment - Initial Assessment Questions 1. LOCATION: "Where does it hurt?"      Upper  2. RADIATION: "Does the pain shoot anywhere else?" (e.g., chest, back)     No 3. ONSET: "When did the pain begin?" (e.g., minutes, hours or days ago)      3 weeks ago 4. SUDDEN: "Gradual or sudden onset?"     Sudden 5. PATTERN "Does the pain come and go, or is it constant?"    - If it comes and goes: "How long does it last?" "Do you have pain now?"     (Note: Comes and goes means the pain is intermittent. It goes away completely between bouts.)    - If constant: "Is it getting better, staying the same, or getting worse?"      (Note: Constant means the pain never goes away completely; most serious pain is constant and gets worse.)      Comes and goes 6. SEVERITY: "How bad is the pain?"  (e.g., Scale 1-10; mild, moderate, or severe)    - MILD (1-3): Doesn't interfere with normal activities, abdomen soft and not tender to touch.     - MODERATE (4-7): Interferes with normal activities or awakens from sleep, abdomen tender to touch.     - SEVERE (8-10): Excruciating pain, doubled over, unable to do any normal activities.       Now - 7 7. RECURRENT SYMPTOM: "Have you ever had this type of stomach pain before?" If Yes, ask: "When was the last time?" and "What happened that time?"      Yes 8. CAUSE: "What do you think is causing the stomach pain?"     Maybe ulcers 9. RELIEVING/AGGRAVATING FACTORS: "What makes it better or worse?" (e.g., antacids, bending or twisting  motion, bowel movement)     Nothing 10. OTHER SYMPTOMS: "Do you have any other symptoms?" (e.g., back pain, diarrhea, fever, urination pain, vomiting)       Nausea 11. PREGNANCY: "Is there any chance you are pregnant?" "When was your last menstrual period?"       No  Protocols used: Abdominal Pain - [facility]

## 2022-07-07 IMAGING — CT CT L SPINE W/O CM
3 of 4 series · 12 of 33 positions shown, 14 images · IV contrast (APPLIED)
Comparison: MRI lumbar spine dated October 28, 2020.

CLINICAL DATA: Severe lower back pain.  Recent fall.

EXAM:
CT LUMBAR SPINE WITHOUT CONTRAST
TECHNIQUE: Multidetector CT imaging of the lumbar spine was performed without
intravenous contrast administration. Multiplanar CT image
reconstructions were also generated.

[Series 1: multi disc · axial · 0.30mm/px · z∈[-244,-27]mm · 4 of 153 slices shown, 5 images]
[im 22/153  soft-tissue]
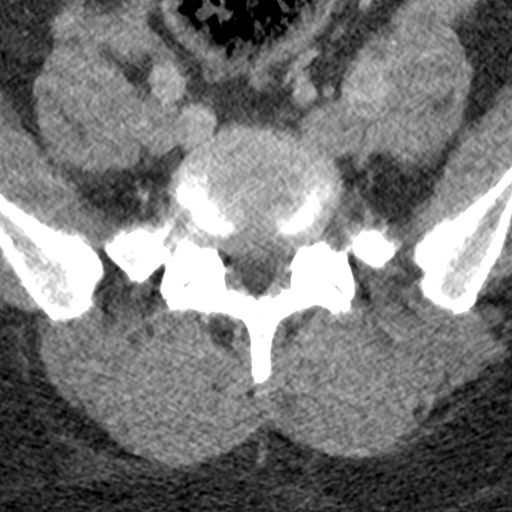
[im 22/153  bone]
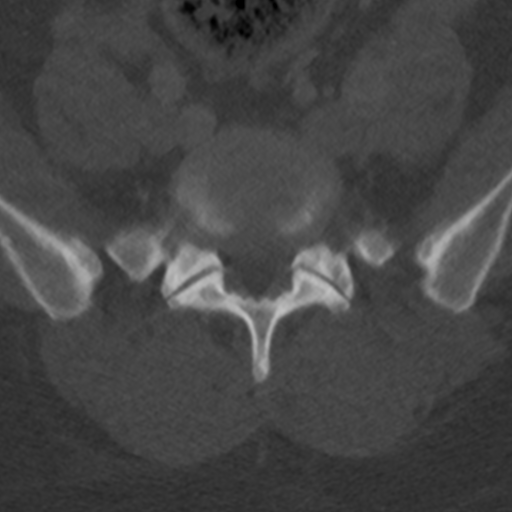
[im 66/153  bone]
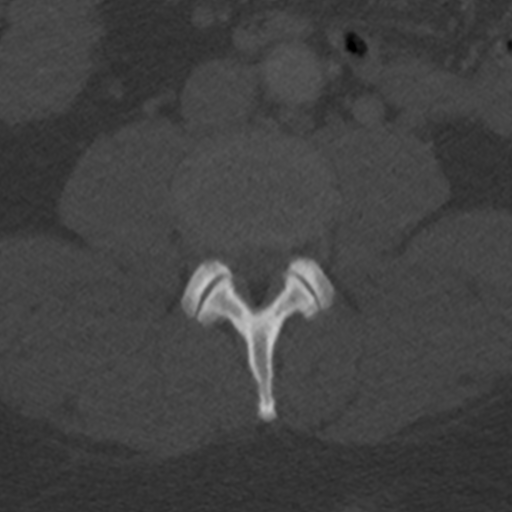
[im 87/153  bone]
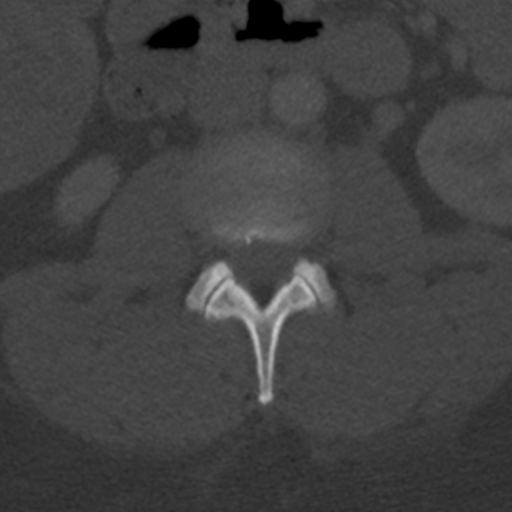
[im 131/153  bone]
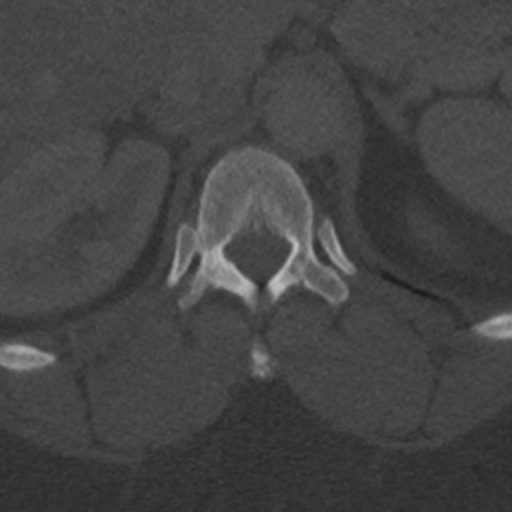

[Series 5: cor · coronal · 0.34mm/px · 3 of 73 slices shown]
[im 15/73  bone]
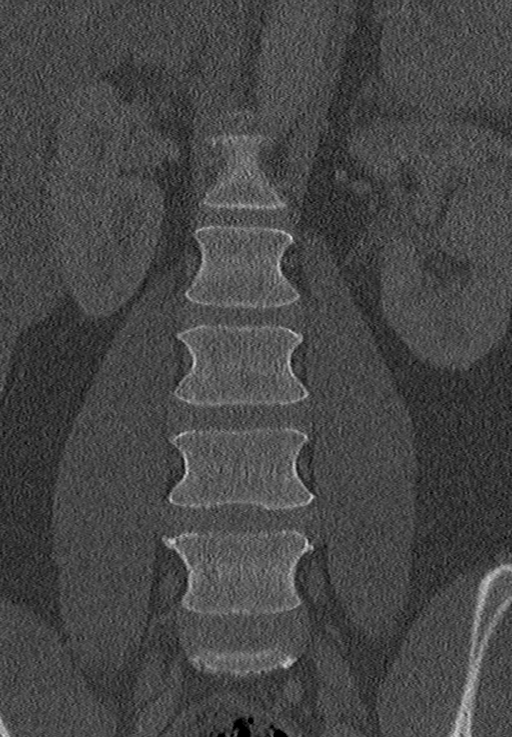
[im 29/73  bone]
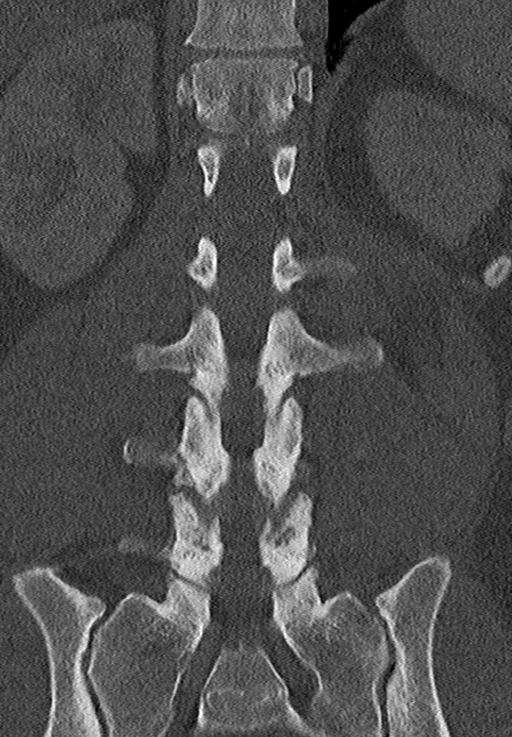
[im 44/73  bone]
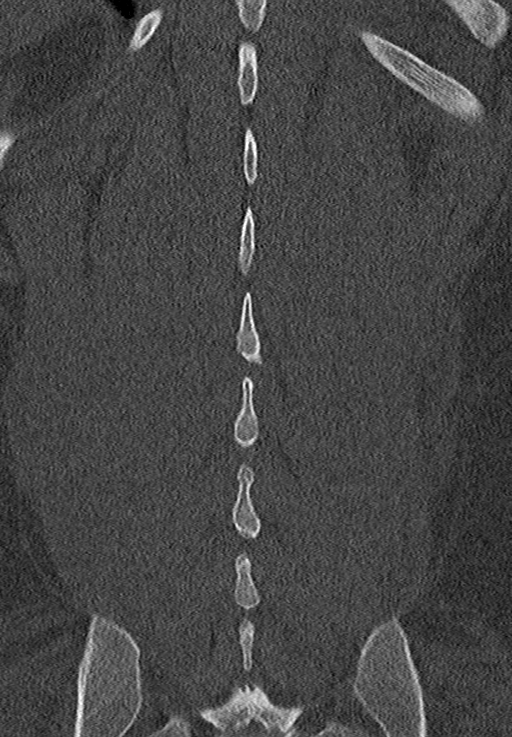

[Series 6: sag · sagittal · 0.28mm/px · 5 of 64 slices shown, 6 images]
[im 22/64  bone]
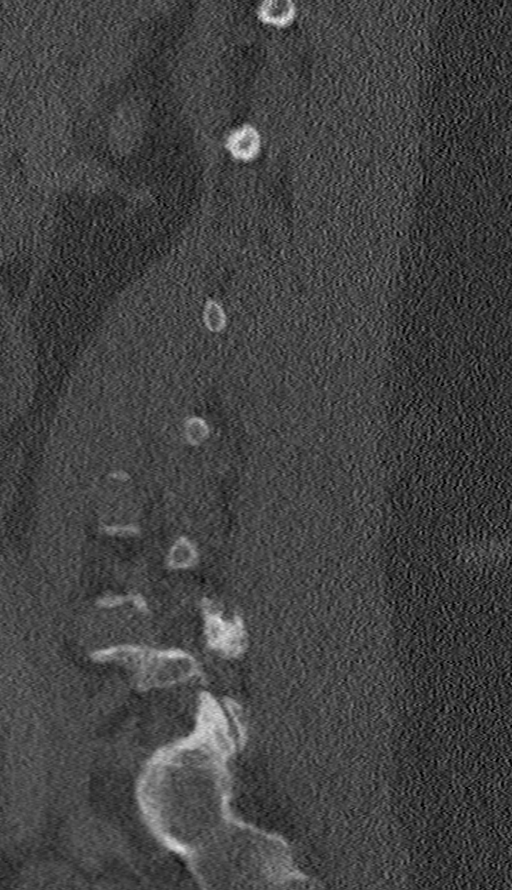
[im 27/64  bone]
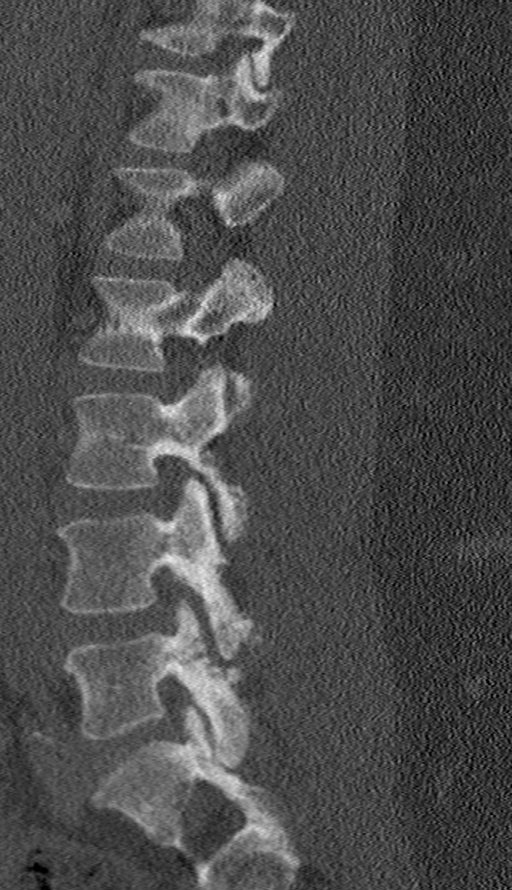
[im 32/64  soft-tissue]
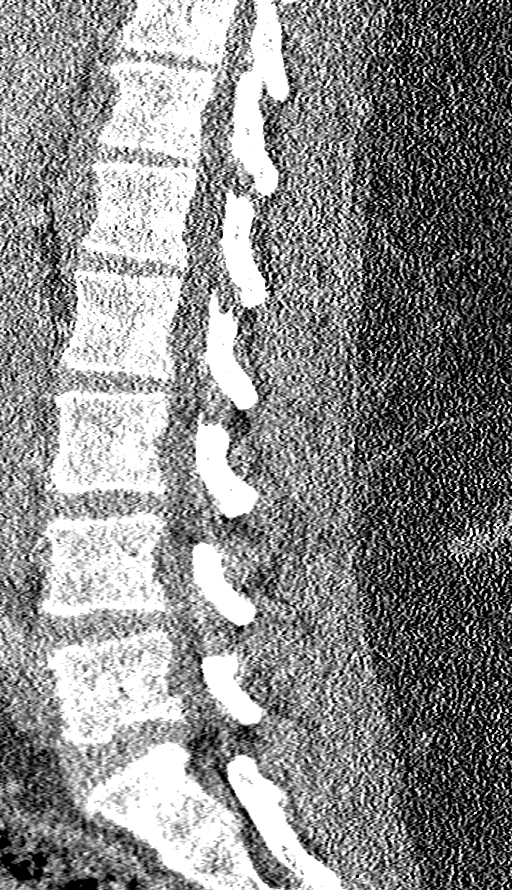
[im 32/64  bone]
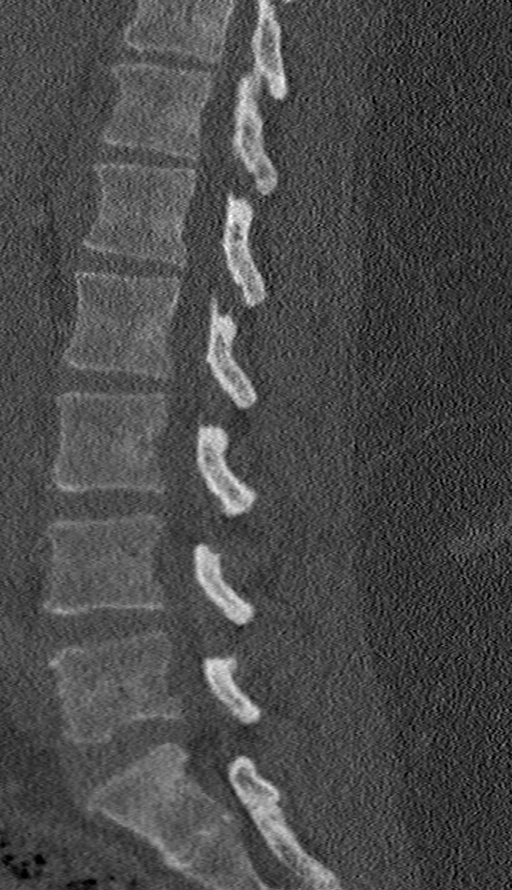
[im 37/64  bone]
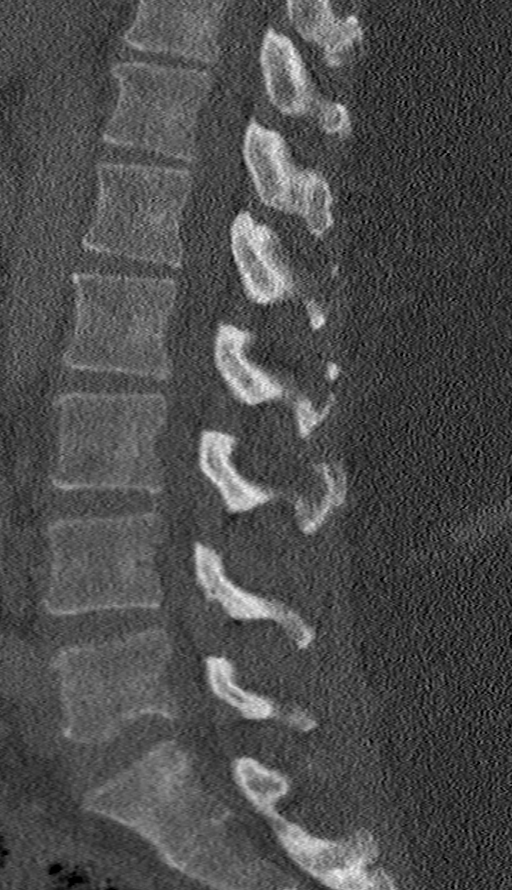
[im 43/64  bone]
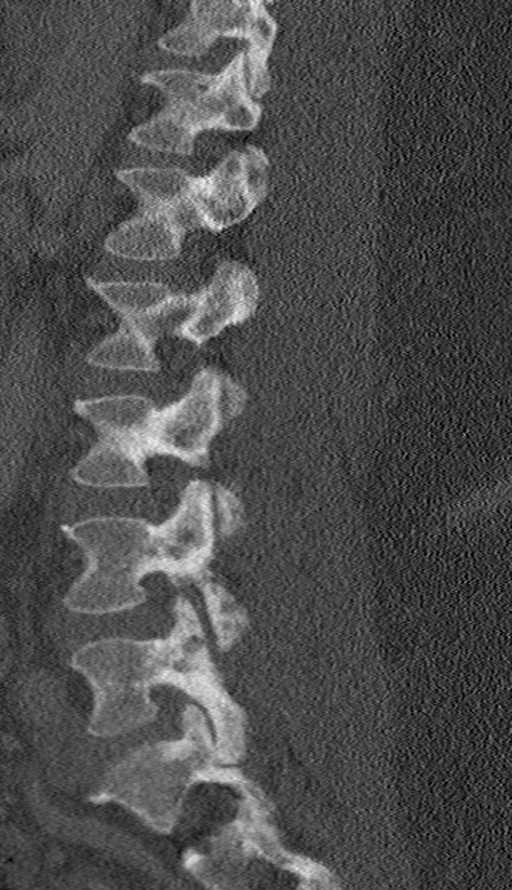

[12 of 33 positions shown; findings below may reference images not displayed]

FINDINGS: Segmentation: Standard.

Alignment: Normal.

Vertebrae: No acute fracture or focal pathologic process.

Paraspinal and other soft tissues: Please see separate CT abdomen
pelvis report from same day.

Disc levels:

T12-L1 to L3-L4: Negative disc. Mild facet arthropathy. No stenosis.
Findings are unchanged.

L4-L5: Mild disc bulging and moderate bilateral facet arthropathy,
similar to prior study. Mild spinal canal and bilateral
neuroforaminal stenosis, unchanged.

L5-S1: Negative disc. Mild bilateral facet arthropathy. Mild left
neuroforaminal stenosis. Findings are unchanged.
IMPRESSION: 1. No acute osseous abnormality.
2. Unchanged mild degenerative changes of the lower lumbar spine as
described above.
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
## 2022-07-07 IMAGING — CR DG CHEST 2V
1 series · 2 of 2 positions shown · non-contrast
Comparison: Chest x-ray 12/17/2018, CT chest 08/15/2018

CLINICAL DATA: Shortness of breath.

EXAM:
CHEST - 2 VIEW

[Series 1: dg chest 2 view · 0.14mm/px · 2 of 2 slices shown]
[im 1/2]
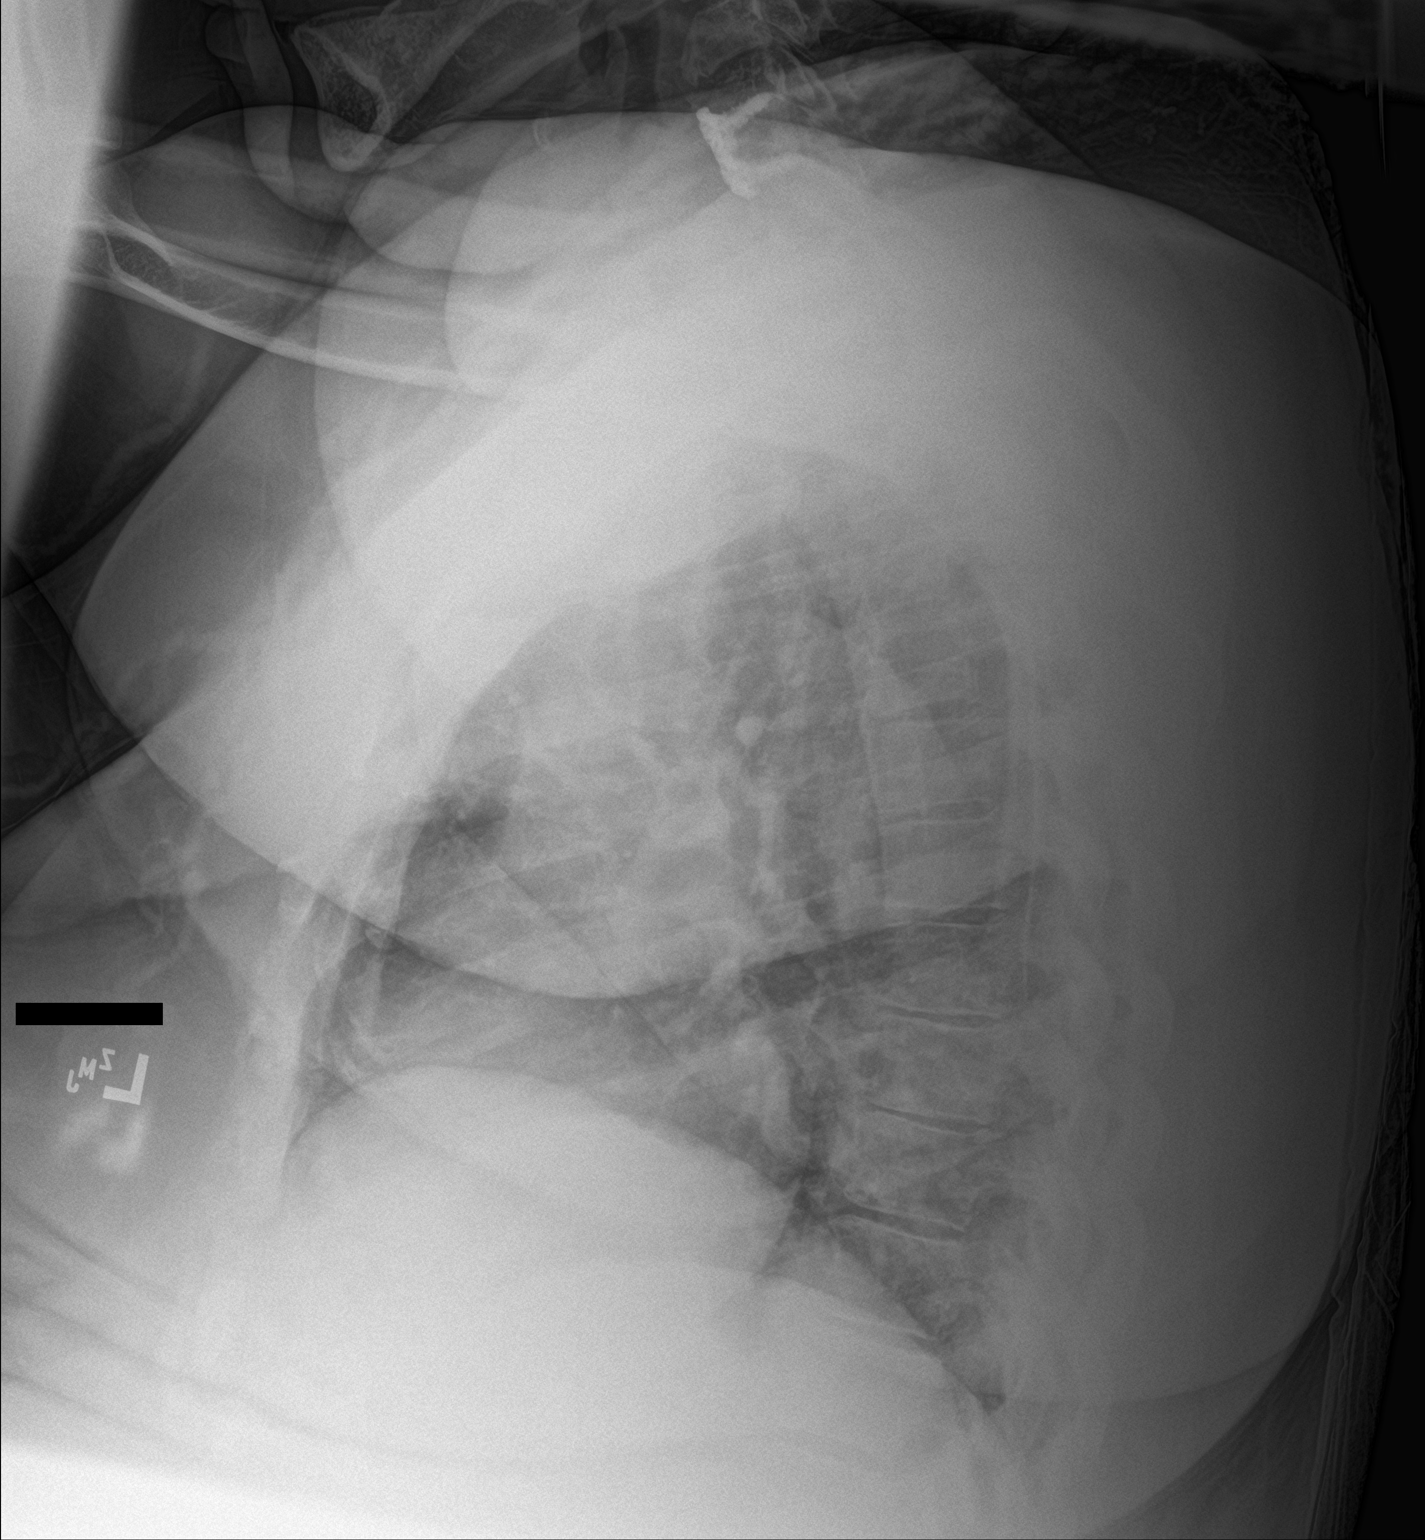
[im 2/2]
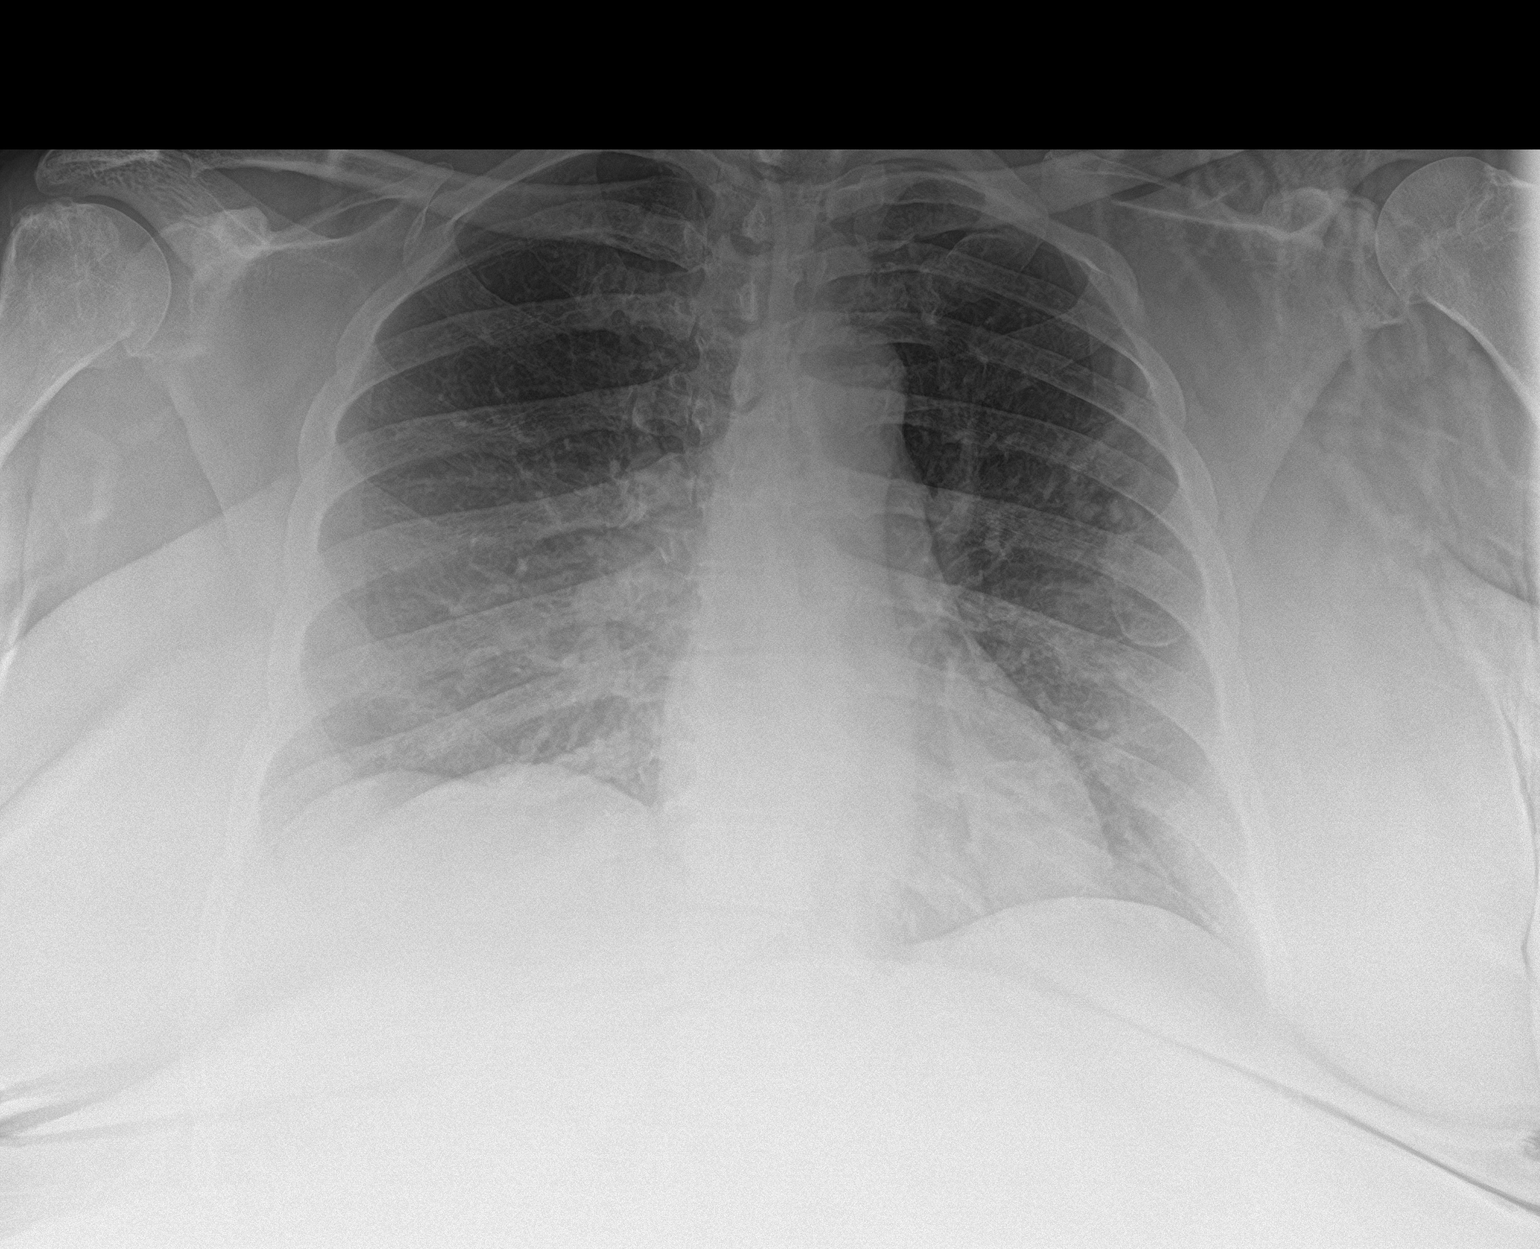

[2 of 2 positions shown; findings below may reference images not displayed]

FINDINGS: The heart and mediastinal contours are within normal limits.

No focal consolidation. No pulmonary edema. No pleural effusion. No
pneumothorax.

No acute osseous abnormality.
IMPRESSION: No active cardiopulmonary disease.

## 2022-07-07 IMAGING — CT CT ABD-PELV W/ CM
2 of 5 series · 17 of 46 positions shown, 19 images · IV contrast (omnipaque)
Comparison: CT abdomen pelvis dated July 31, 2019.

CLINICAL DATA: Severe lower back pain.  Recent fall.

EXAM:
CT ABDOMEN AND PELVIS WITH CONTRAST
TECHNIQUE: Multidetector CT imaging of the abdomen and pelvis was performed
using the standard protocol following bolus administration of
intravenous contrast.
CONTRAST:  100mL OMNIPAQUE IOHEXOL 350 MG/ML SOLN

[Series 2: axial st · axial · 0.98mm/px · z∈[-396,+74]mm · 14 of 106 slices shown, 16 images]
[im 6/106  soft-tissue]
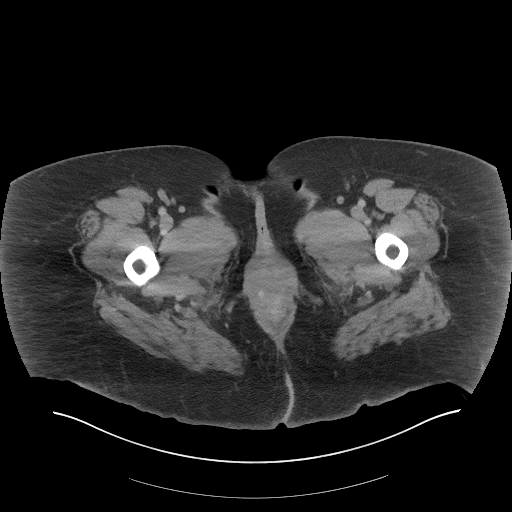
[im 6/106  bone]
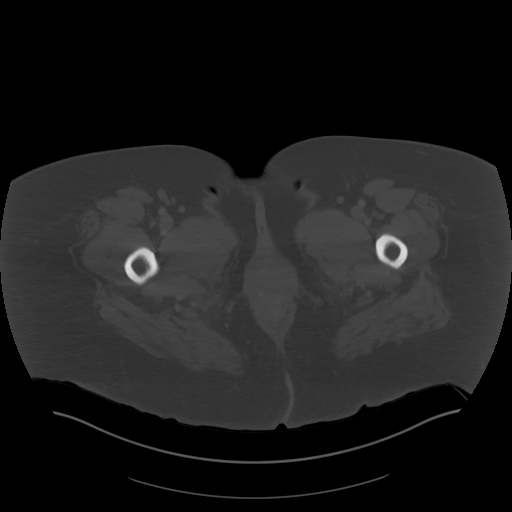
[im 12/106  soft-tissue]
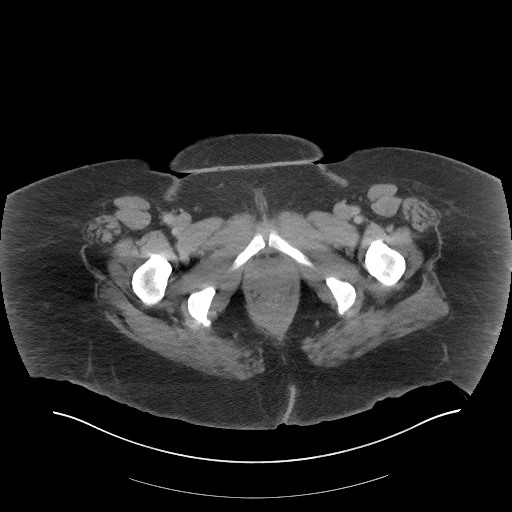
[im 24/106  soft-tissue]
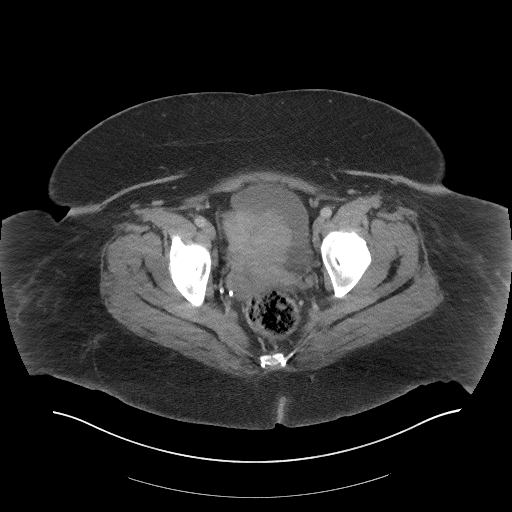
[im 30/106  soft-tissue]
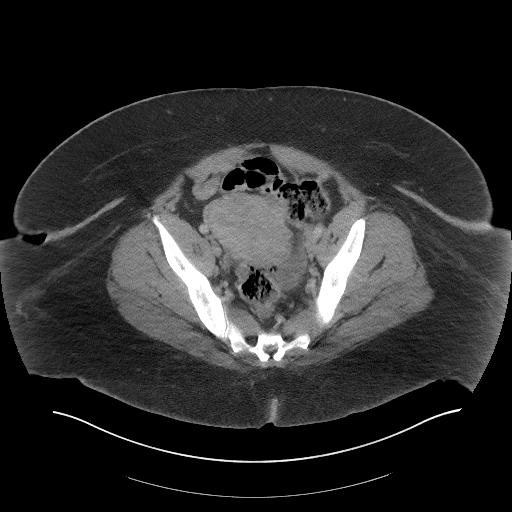
[im 36/106  soft-tissue]
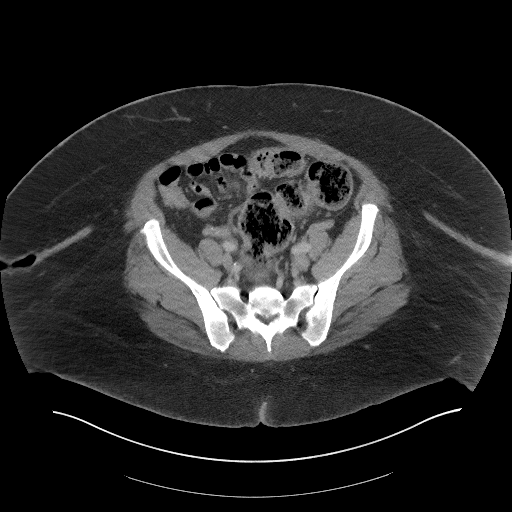
[im 41/106  soft-tissue]
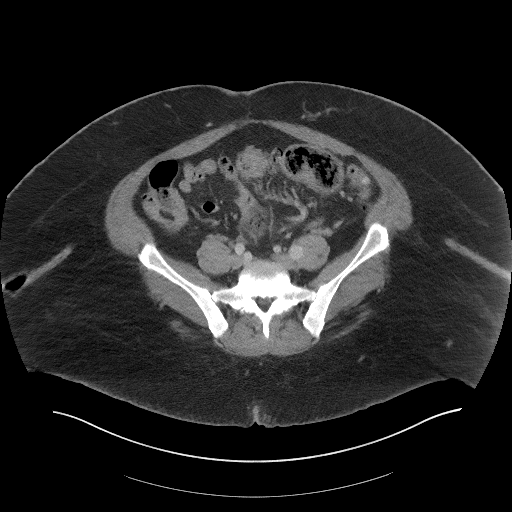
[im 47/106  soft-tissue]
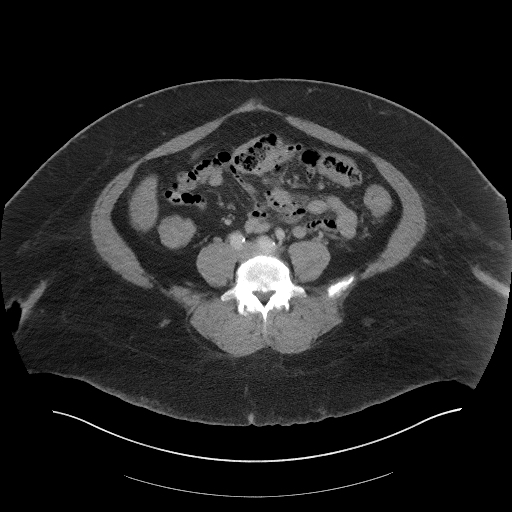
[im 59/106  soft-tissue]
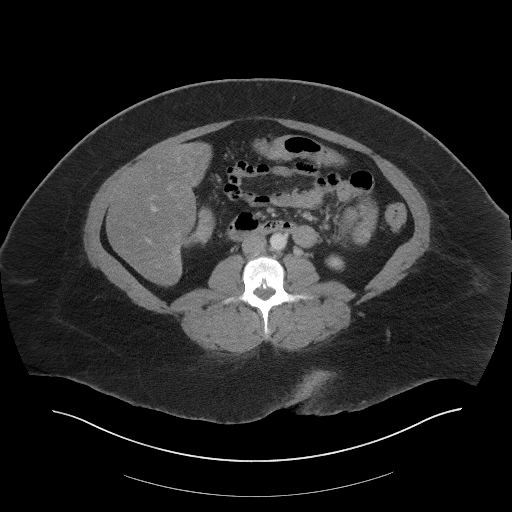
[im 65/106  soft-tissue]
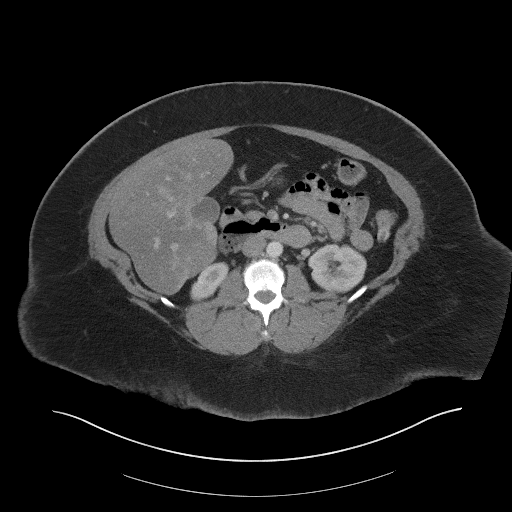
[im 65/106  bone]
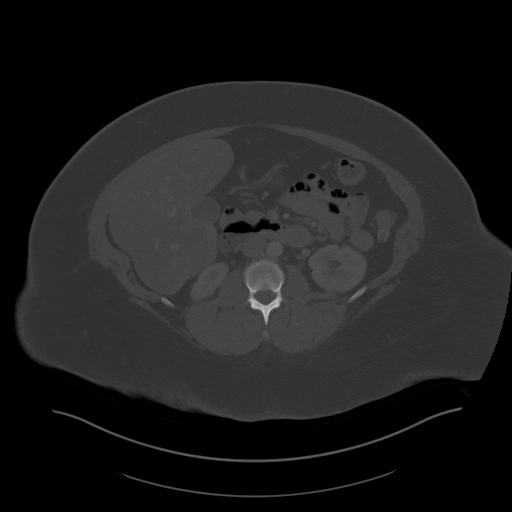
[im 71/106  soft-tissue]
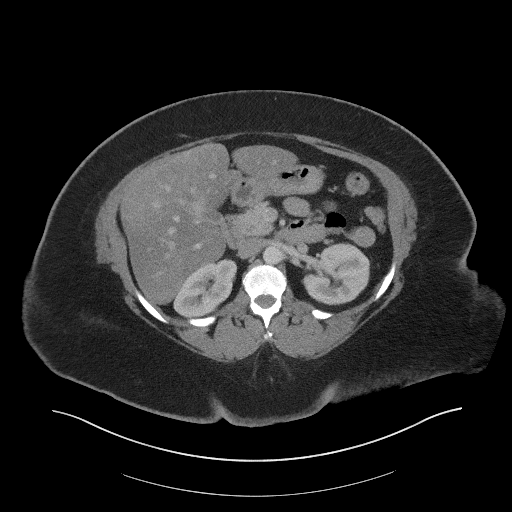
[im 76/106  soft-tissue]
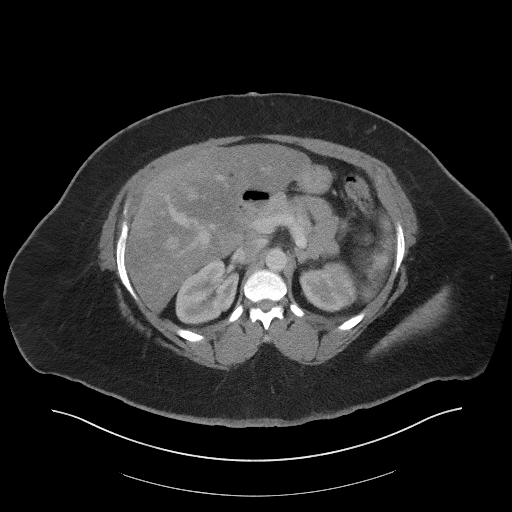
[im 82/106  soft-tissue]
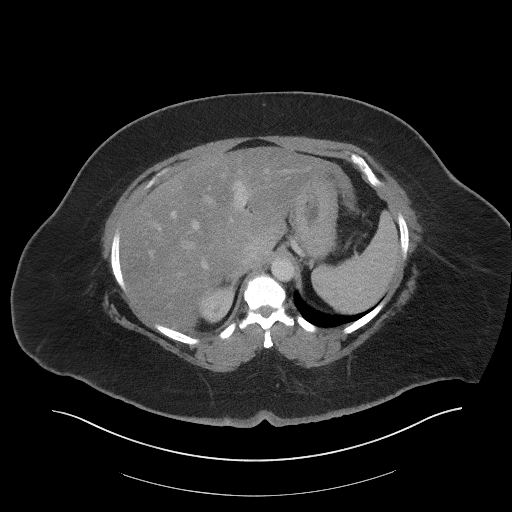
[im 94/106  soft-tissue]
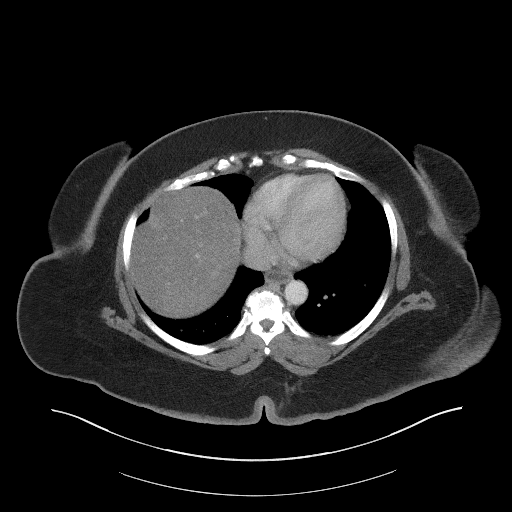
[im 100/106  soft-tissue]
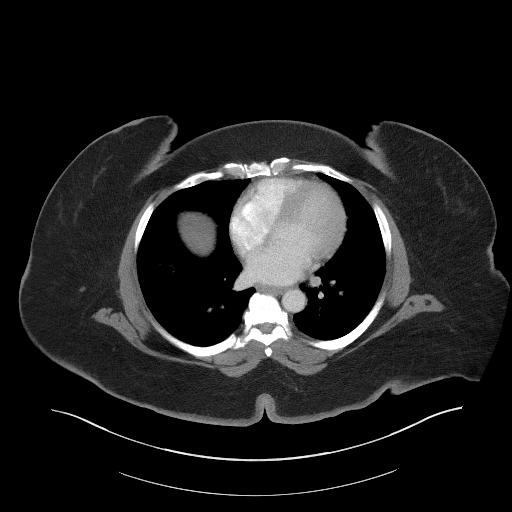

[Series 5: coronal st · coronal · 0.98mm/px · 3 of 108 slices shown]
[im 36/108  soft-tissue]
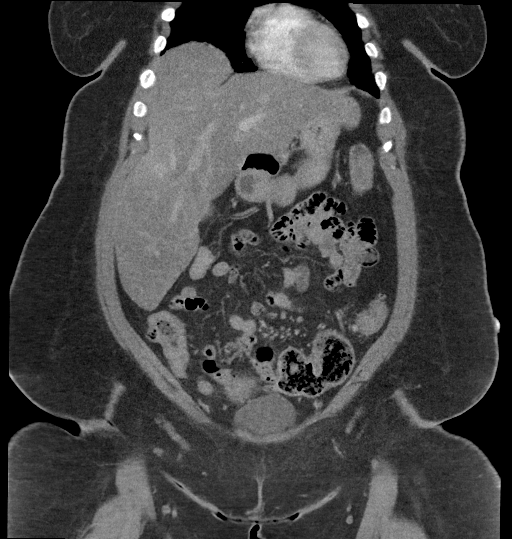
[im 48/108  soft-tissue]
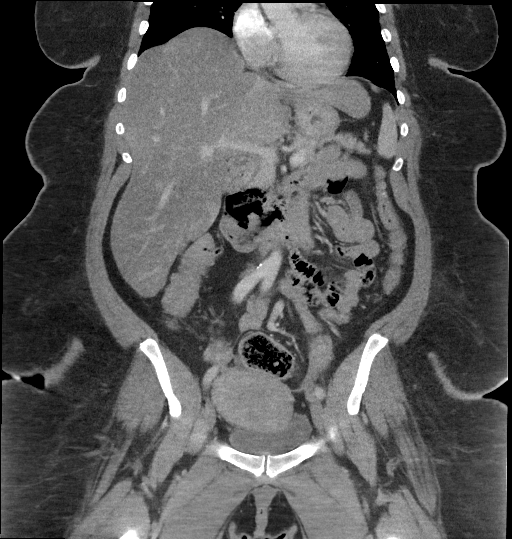
[im 60/108  soft-tissue]
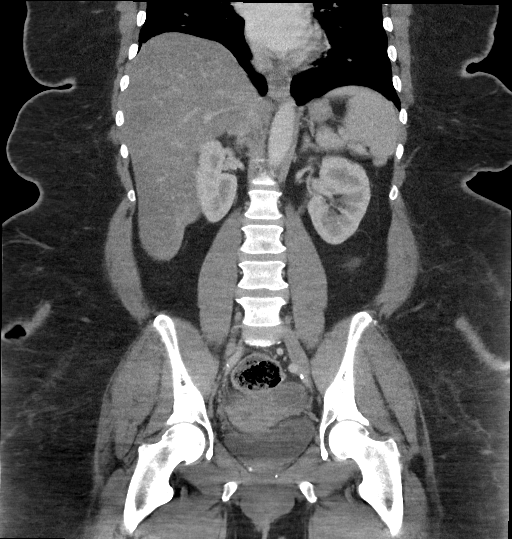

[17 of 46 positions shown; findings below may reference images not displayed]

FINDINGS: Lower chest: No acute abnormality.

Hepatobiliary: Unchanged severe hepatic steatosis. Severe
hepatomegaly, measuring 28.0 cm in craniocaudal dimension. No new
focal liver abnormality. The gallbladder is unremarkable. No biliary
dilatation.

Pancreas: Unremarkable. No pancreatic ductal dilatation or
surrounding inflammatory changes.

Spleen: Normal in size without focal abnormality.

Adrenals/Urinary Tract: Adrenal glands are unremarkable. Kidneys are
normal, without renal calculi, focal lesion, or hydronephrosis.
Bladder is unremarkable.

Stomach/Bowel: Stomach is within normal limits. Appendix appears
normal. No evidence of bowel wall thickening, distention, or
inflammatory changes. Diffuse colonic diverticulosis.

Vascular/Lymphatic: No significant vascular findings are present. No
enlarged abdominal or pelvic lymph nodes.

Reproductive: Fibroid uterus.  No adnexal mass.

Other: No abdominal wall hernia or abnormality. No abdominopelvic
ascites. No pneumoperitoneum.

Musculoskeletal: No acute or significant osseous findings.
IMPRESSION: 1. No acute intra-abdominal process.
2. Unchanged severe hepatic steatosis.
3. Fibroid uterus.

## 2022-07-08 ENCOUNTER — Telehealth: Payer: Self-pay | Admitting: Internal Medicine

## 2022-07-15 IMAGING — US US EXTREM LOW VENOUS*L*
1 series · 14 of 24 positions shown · non-contrast
Comparison: None.

CLINICAL DATA: Left leg pain

EXAM:
Left LOWER EXTREMITY VENOUS DOPPLER ULTRASOUND
TECHNIQUE: Gray-scale sonography with compression, as well as color and duplex
ultrasound, were performed to evaluate the deep venous system(s)
from the level of the common femoral vein through the popliteal and
proximal calf veins.

[Series 1: us venous img lower uni left (dvt) · portal-venous · 14 of 33 slices shown]
[im 1/33]
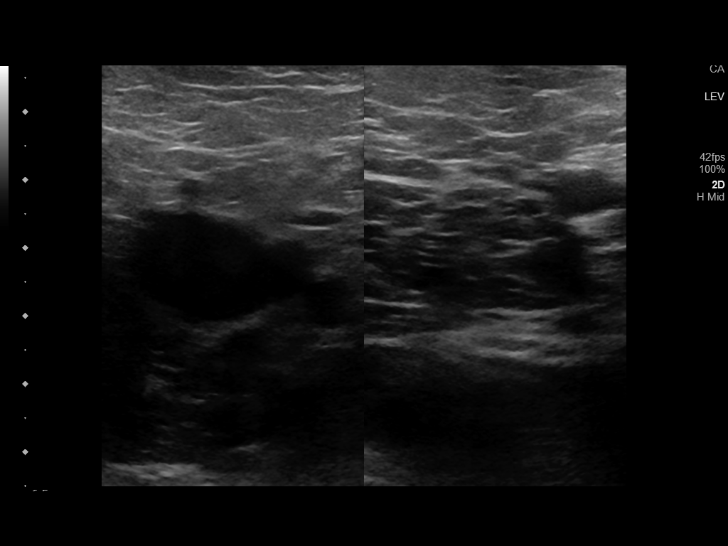
[im 3/33]
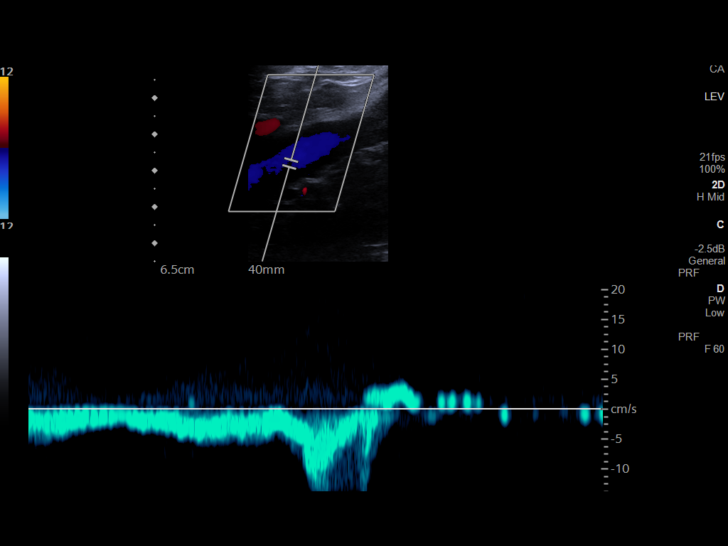
[im 6/33]
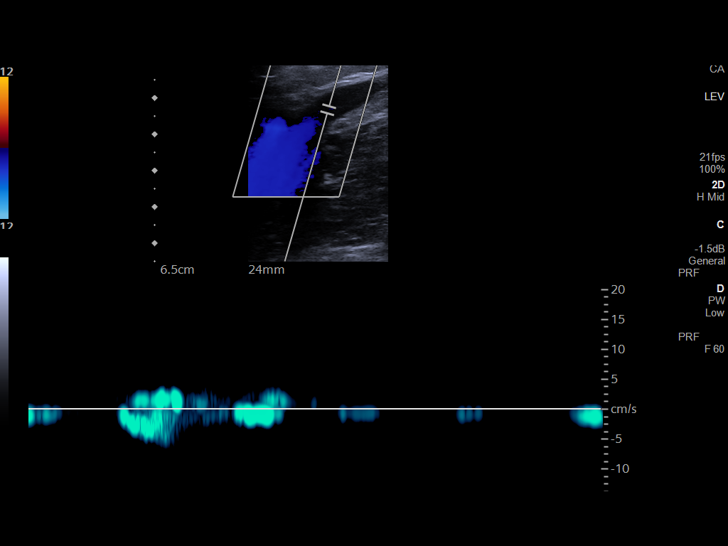
[im 9/33]
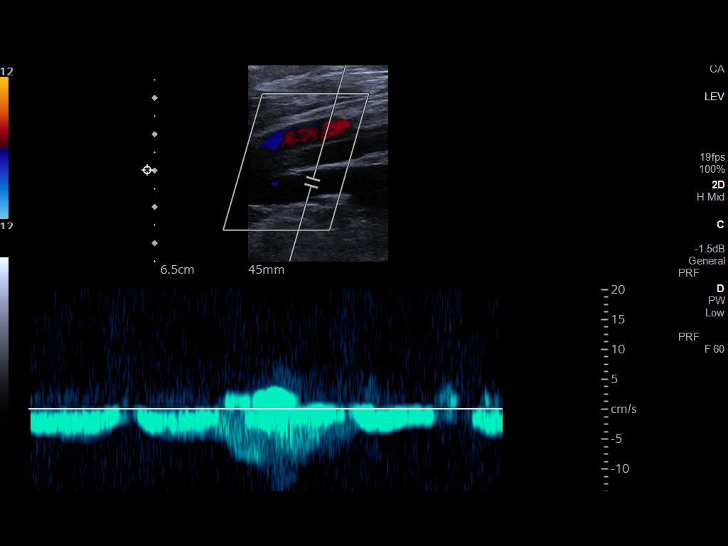
[im 10/33]
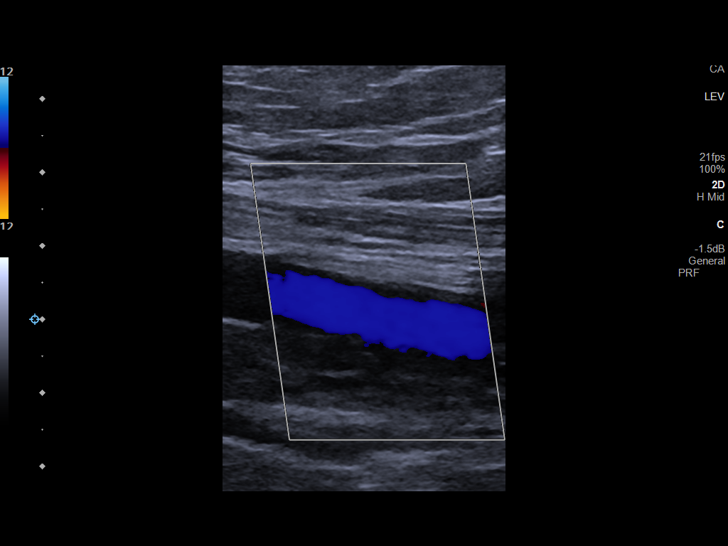
[im 13/33]
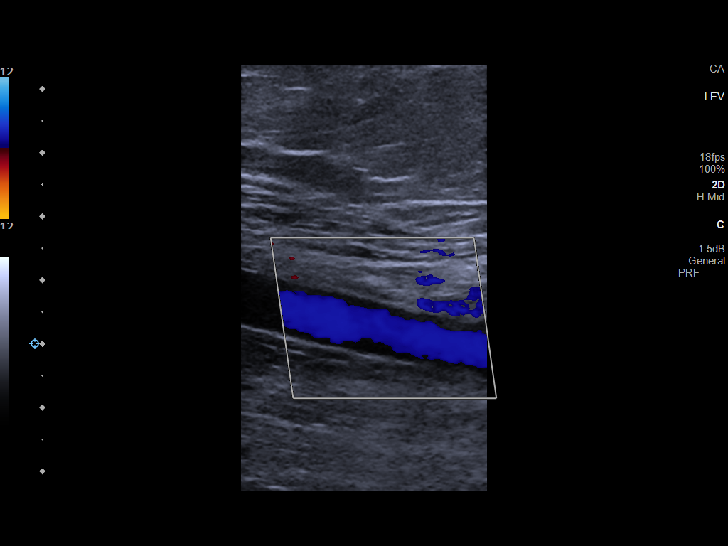
[im 16/33]
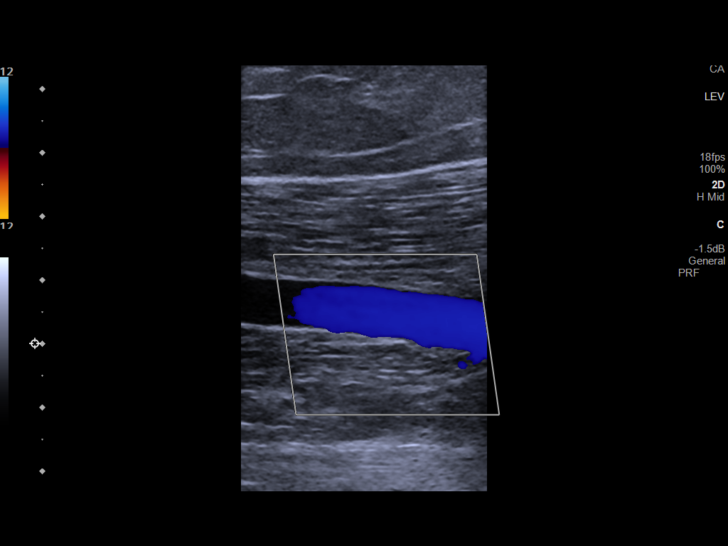
[im 17/33]
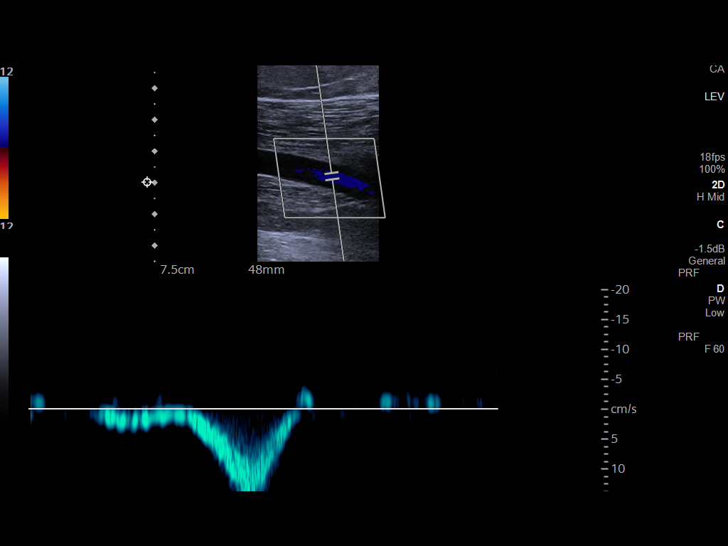
[im 20/33]
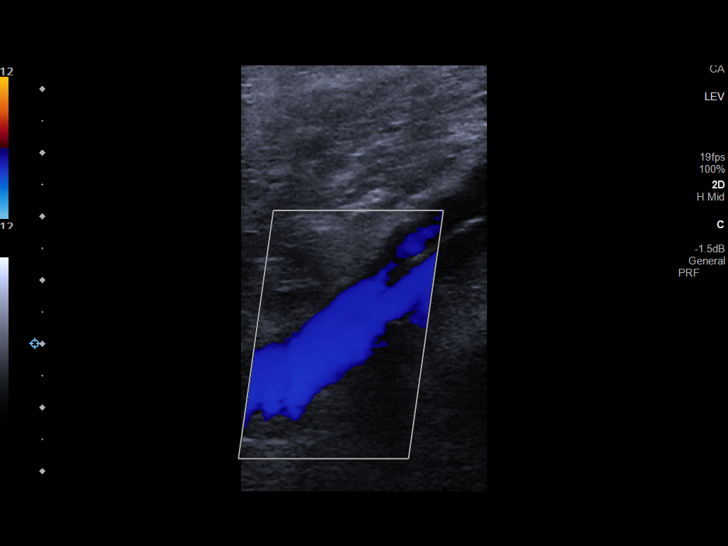
[im 23/33]
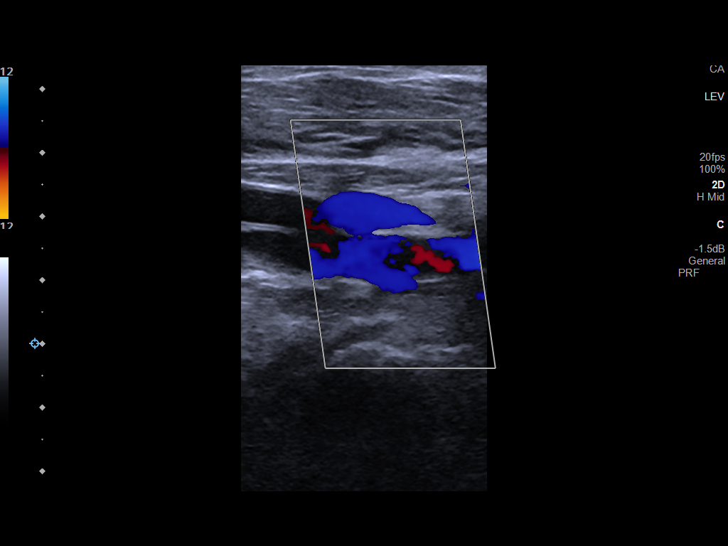
[im 26/33]
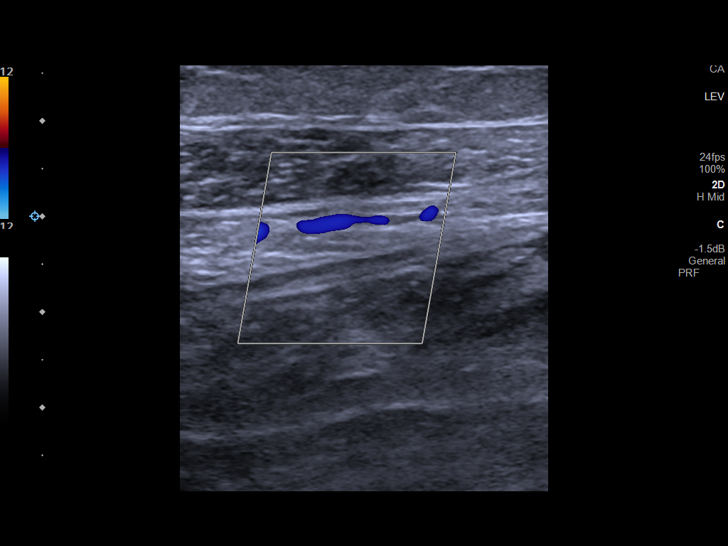
[im 27/33]
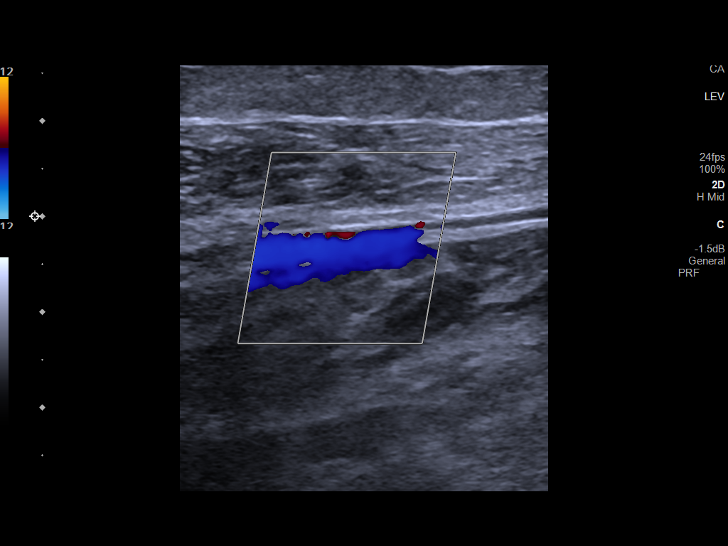
[im 30/33]
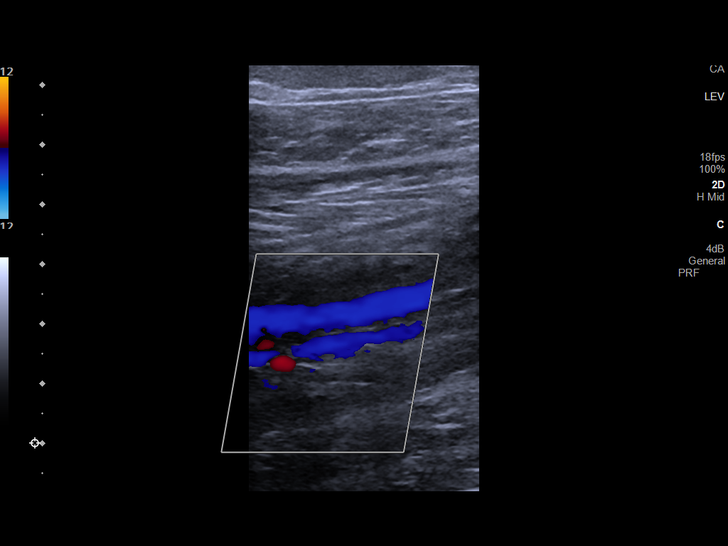
[im 33/33]
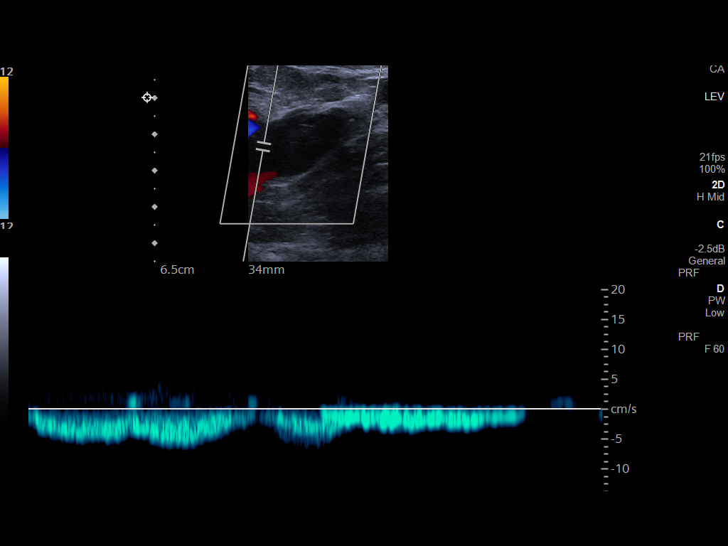

[14 of 24 positions shown; findings below may reference images not displayed]

FINDINGS: VENOUS

Normal compressibility of the common femoral, superficial femoral,
and popliteal veins, as well as the visualized calf veins.
Visualized portions of profunda femoral vein and great saphenous
vein unremarkable. No filling defects to suggest DVT on grayscale or
color Doppler imaging. Doppler waveforms show normal direction of
venous flow, normal respiratory plasticity and response to
augmentation.

Limited views of the contralateral common femoral vein are
unremarkable.

OTHER

None.

Limitations: none
IMPRESSION: No DVT identified.

## 2022-07-27 ENCOUNTER — Encounter: Payer: Self-pay | Admitting: Internal Medicine

## 2022-07-27 ENCOUNTER — Ambulatory Visit: Payer: Medicaid Other | Admitting: Internal Medicine

## 2022-07-27 VITALS — BP 128/86 | HR 92 | Temp 98.1°F | Resp 18 | Ht 66.0 in | Wt 311.8 lb

## 2022-07-27 DIAGNOSIS — K219 Gastro-esophageal reflux disease without esophagitis: Secondary | ICD-10-CM

## 2022-07-27 DIAGNOSIS — E785 Hyperlipidemia, unspecified: Secondary | ICD-10-CM

## 2022-07-27 DIAGNOSIS — Z794 Long term (current) use of insulin: Secondary | ICD-10-CM

## 2022-07-27 DIAGNOSIS — E1169 Type 2 diabetes mellitus with other specified complication: Secondary | ICD-10-CM

## 2022-07-27 DIAGNOSIS — I10 Essential (primary) hypertension: Secondary | ICD-10-CM

## 2022-07-27 DIAGNOSIS — E1165 Type 2 diabetes mellitus with hyperglycemia: Secondary | ICD-10-CM

## 2022-07-27 DIAGNOSIS — R1013 Epigastric pain: Secondary | ICD-10-CM

## 2022-07-27 DIAGNOSIS — J45909 Unspecified asthma, uncomplicated: Secondary | ICD-10-CM | POA: Diagnosis not present

## 2022-07-27 LAB — POCT GLYCOSYLATED HEMOGLOBIN (HGB A1C): Hemoglobin A1C: 7.8 % — AB (ref 4.0–5.6)

## 2022-07-27 MED ORDER — INSULIN LISPRO 100 UNIT/ML IJ SOLN: 30.0000 [IU] | Freq: Three times a day (TID) | INTRAMUSCULAR | 11 refills | Status: DC

## 2022-07-27 MED ORDER — TRULICITY 1.5 MG/0.5ML ~~LOC~~ SOAJ: 1.5000 mg | SUBCUTANEOUS | 2 refills | Status: DC

## 2022-07-27 MED ORDER — PANTOPRAZOLE SODIUM 40 MG PO TBEC: 40.0000 mg | DELAYED_RELEASE_TABLET | Freq: Two times a day (BID) | ORAL | 1 refills | Status: DC

## 2022-07-27 MED ORDER — ALBUTEROL SULFATE HFA 108 (90 BASE) MCG/ACT IN AERS: 2.0000 | INHALATION_SPRAY | Freq: Four times a day (QID) | RESPIRATORY_TRACT | 3 refills | Status: DC | PRN

## 2022-07-27 MED ORDER — INSULIN GLARGINE 100 UNIT/ML ~~LOC~~ SOLN: 100.0000 [IU] | Freq: Every evening | SUBCUTANEOUS | 9 refills | Status: DC

## 2022-07-27 NOTE — Patient Instructions (Signed)
It was great seeing you today!  Plan discussed at today's visit: -Insulin refilled -Trulicity increased, continue to check blood sugars as you have been -Increase Pantoprazole to 40 mg twice daily, referral to GI placed -Call Orthopedic doctor for appointment    Follow up in: 3 months   Take care and let us know if you have any questions or concerns prior to your next visit.  Dr. Rosana Berger

## 2022-07-27 NOTE — Progress Notes (Signed)
Established Patient Office Visit  Subjective:  Patient ID: Brenda Hicks, female    DOB: July 04, 1966  Age: 56 y.o. MRN: [id number]  CC:  Chief Complaint  Patient presents with   Follow-up   Diabetes   Hypertension   Hyperlipidemia    HPI Brenda Hicks presents for follow up on chronic medical conditions. Was seen in the ER on 06/21/2022 complaining of epigastric pain and nausea.  Blood work without abnormalities.  Lipase negative.  Troponin levels negative.  UA normal.  She was started on pantoprazole 40 mg and Carafate.  She states since being on these medications she has not noticed any change in her symptoms.  She does have a history of gastric ulcers.  She denies any abnormal bleeding or blood in her stools, dark stools.  Hypertension, OSA: -Medications: Currently on Lisinopril 40 mg  -Patient is compliant with medications and reports no side effects.  -Checking BP at home: no -Denies any SOB, CP, LE edema, medication SEs, or symptoms of hypotension -Was previously diagnosed with OSA but has been without a CPAP in some time - Sleep study early December showing mild OSA with plans to return to the sleep center for PAP titration    Diabetes, Type 2 -Last A1c 7.8% 4/23 -Medications: Lantus 100u daily (takes at bedtime), Humalog 85F TID, Trulicity 2.92 on Sundays - needs refills on Trulicity  -Checking BG at home: fasting - 127-160. Had one low sugar but wasn't eating as much  -Diet: working on eating less carbs, lost about 7 pounds since LOV -Exercise: increased walking, trying to stay active with grandchildren, starting weight lifting  -Eye exam: Dec 22, 2021 -Foot exam: Due  -Microalbumin: on ACEI -Statin: Lipitor 40 mg -PNA vaccine: UTD   HLD: -Medications: Lipitor 40 mg -Tolerating medication well, no side effects, compliant.  -Last lipid panel Lipid Panel     Component Value Date/Time   CHOL 185 12/14/2021 1019   TRIG 109 12/14/2021 1019   HDL 41 (L) 12/14/2021  1019   CHOLHDL 4.5 12/14/2021 1019   VLDL 20 03/31/2018 0539   LDLCALC 122 (H) 12/14/2021 1019   The 10-year ASCVD risk score (Arnett DK, et al., 2019) is: 13.4%   Values used to calculate the score:     Age: 87 years     Sex: Female     Is Non-Hispanic African American: Yes     Diabetic: Yes     Tobacco smoker: No     Systolic Blood Pressure: 446 mmHg     Is BP treated: Yes     HDL Cholesterol: 41 mg/dL     Total Cholesterol: 185 mg/dL  Asthma:  -Asthma status: stable -Current Treatments: albuterol PRN, needs refill -Satisfied with current treatment?: yes -Current upper respiratory symptoms: no -Triggers: pollen -Visits to ER or Urgent Care in past year: no -Pneumovax: Up to Date -Influenza: Up to Date  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 Seen by Neurosurg 5/20 for R arm pain, cervical and shoulder MRI unrevealing. Neurosurg recommending considering injections or therapy. Also following with Orthopedics for left knee pain, note reviewed 03/05/22. She is on Celebrex for pain and was given a steroid injection in the knee.   Past Medical History:  Diagnosis Date   Anemia    vitamin d deficiency   Arthritis    Asthma    WELL CONTROLLED   Cancer of ear    skin cancer left ear   COPD (chronic  obstructive pulmonary disease) (HCC)    Diabetes mellitus without complication ([place])    Fatty liver    Hypertension    Kidney cysts    per patient, never had   Renal disorder    Sleep apnea    DOES NOT USE CPAP. waiting for new machine and a new sleep study   Stroke [facility]) May or June 2019   TIA. no residual symptoms    Past Surgical History:  Procedure Laterality Date   ABDOMINAL HYSTERECTOMY     ANTERIOR CERVICAL DECOMP/DISCECTOMY FUSION N/A 08/09/2018   Procedure: ANTERIOR CERVICAL DECOMPRESSION/DISCECTOMY FUSION 1 LEVEL- C4-5;  Surgeon: Meade Maw, MD;  Location: [facility] ORS;  Service: Neurosurgery;  Laterality: N/A;   BACK SURGERY     NECK    COLONOSCOPY WITH PROPOFOL N/A 02/26/2021   Procedure: COLONOSCOPY WITH PROPOFOL;  Surgeon: Jonathon Bellows, MD;  Location: [facility] ENDOSCOPY;  Service: Endoscopy;  Laterality: N/A;   DILATATION & CURETTAGE/HYSTEROSCOPY WITH MYOSURE N/A 01/19/2021   Procedure: DILATATION & CURETTAGE/HYSTEROSCOPY;  Surgeon: Rubie Maid, MD;  Location: [facility] ORS;  Service: Gynecology;  Laterality: N/A;   DILATION AND CURETTAGE OF UTERUS     ENDOMETRIAL ABLATION N/A 01/19/2021   Procedure: ENDOMETRIAL ABLATION, MINERVA;  Surgeon: Rubie Maid, MD;  Location: [facility] ORS;  Service: Gynecology;  Laterality: N/A;   ENDOMETRIAL BIOPSY     benign   EXPLORATORY LAPAROTOMY  1992   REMOVAL OF RUPTURED ECTOPIC   HAND SURGERY Right 1998   cyst removed   HERNIA REPAIR  9983   UMBILICAL   JOINT REPLACEMENT Right 2014   TKR   KNEE ARTHROSCOPY Right 2012   KNEE SURGERY Right 2014   total knee replacement   ROBOTIC ASSISTED LAPAROSCOPIC HYSTERECTOMY AND SALPINGECTOMY Bilateral 03/15/2022   Procedure: XI ROBOTIC ASSISTED LAPAROSCOPIC HYSTERECTOMY;  Surgeon: Rubie Maid, MD;  Location: [facility] ORS;  Service: Gynecology;  Laterality: Bilateral;   SHOULDER ARTHROSCOPY WITH BICEPSTENOTOMY Left 12/14/2016   Procedure: SHOULDER ARTHROSCOPY WITH BICEPSTENOTOMY;  Surgeon: Corky Mull, MD;  Location: [facility] ORS;  Service: Orthopedics;  Laterality: Left;   SHOULDER ARTHROSCOPY WITH OPEN ROTATOR CUFF REPAIR Left 12/14/2016   Procedure: SHOULDER ARTHROSCOPY WITH OPEN ROTATOR CUFF REPAIR AND ARTHROSCOPIC ROTATOR CUFF REPAIR;  Surgeon: Corky Mull, MD;  Location: [facility] ORS;  Service: Orthopedics;  Laterality: Left;   SHOULDER ARTHROSCOPY WITH ROTATOR CUFF REPAIR Right 01/04/2019   Procedure: SHOULDER ARTHROSCOPY WITH ROTATOR CUFF REPAIR;  Surgeon: Corky Mull, MD;  Location: [facility] ORS;  Service: Orthopedics;  Laterality: Right;   SHOULDER ARTHROSCOPY WITH SUBACROMIAL DECOMPRESSION Left 12/14/2016   Procedure: SHOULDER ARTHROSCOPY WITH  SUBACROMIAL DECOMPRESSION;  Surgeon: Corky Mull, MD;  Location: [facility];  Service: Orthopedics;  Laterality: Left;   SHOULDER ARTHROSCOPY WITH SUBACROMIAL DECOMPRESSION AND BICEP TENDON REPAIR Right 01/04/2019   Procedure: SHOULDER ARTHROSCOPY WITH DEBRIDEMENT AND SUBACROMIAL DECOMPRESSION-RIGHT;  Surgeon: Corky Mull, MD;  Location: [facility] OR[facility];  Service: Orthopedics;  Laterality: Right;   TUBAL LIGATION      Family History  Problem Relation Age of Onset   Hypertension Mother    Thyroid disease Mother    Lupus Mother    Congestive Heart Failure Mother    Hypertension Brother    CAD Maternal Grandmother    Breast cancer Neg Hx    Ovarian cancer Neg Hx    Colon cancer Neg Hx     Social History   Socioeconomic History   Marital status: Married    Spouse name: brian   Number  of children: 7   Years of education: Not on file   Highest education level: Not on file  Occupational History   Occupation: disabled    Comment: disabled  Tobacco Use   Smoking status: Former    Packs/day: 2.00    Years: 25.00    Total pack years: 50.00    Types: Cigarettes    Quit date: 06/09/2017    Years since quitting: 5.1   Smokeless tobacco: Never  Vaping Use   Vaping Use: Never used  Substance and Sexual Activity   Alcohol use: No   Drug use: Yes    Frequency: 7.0 times per week    Types: Marijuana    Comment: Pt. uses marjiuana only when she has severe back and side pain   Sexual activity: Yes    Partners: Male    Birth control/protection: None  Other Topics Concern   Not on file  Social History Narrative   Have 6 biological childrean and custody of niece since 1 months.   Social Determinants of Health   Financial Resource Strain: Low Risk  (08/07/2019)   Overall Financial Resource Strain (CARDIA)    Difficulty of Paying Living Expenses: Not hard at all  Food Insecurity: No Food Insecurity (08/07/2019)   Hunger Vital Sign    Worried About Running Out of Food in the Last Year:  Never true    Ran Out of Food in the Last Year: Never true  Transportation Needs: No Transportation Needs (08/07/2019)   PRAPARE - Hydrologist (Medical): No    Lack of Transportation (Non-Medical): No  Physical Activity: Inactive (08/07/2019)   Exercise Vital Sign    Days of Exercise per Week: 0 days    Minutes of Exercise per Session: 0 min  Stress: No Stress Concern Present (08/07/2019)   [place] of Stress : Not at all  Social Connections: [place] (08/07/2019)   Social Connection and Isolation Panel [NHANES]    Frequency of Communication with Friends and Family: More than three times a week    Frequency of Social Gatherings with Friends and Family: Never    Attends Religious Services: More than 4 times per year    Active Member of [employer] or Organizations: Yes    Attends Music therapist: More than 4 times per year    Marital Status: Married  Human resources officer Violence: Not At Risk (08/07/2019)   Humiliation, Afraid, Rape, and Kick questionnaire    Fear of Current or Ex-Partner: No    Emotionally Abused: No    Physically Abused: No    Sexually Abused: No    Outpatient Medications Prior to Visit  Medication Sig Dispense Refill   albuterol (VENTOLIN HFA) 108 (90 Base) MCG/ACT inhaler Inhale 2 puffs into the lungs every 6 (six) hours as needed for wheezing or shortness of breath. 18 g 3   atorvastatin (LIPITOR) 40 MG tablet Take 1 tablet (40 mg total) by mouth daily. 90 tablet 3   Continuous Blood Gluc Sensor (FREESTYLE LIBRE 3 SENSOR) MISC 1 each by Does not apply route 4 (four) times daily. Place 1 sensor on the skin every 14 days. Use to check glucose continuously 2 each 3   docusate sodium (COLACE) 100 MG capsule Take 1 capsule (100 mg total) by mouth 2 (two) times daily as needed. 30 capsule 2   Dulaglutide (TRULICITY) 6.46 OE/3.2ZY SOPN Inject 0.75 mg  into the skin once a week. 2 mL 3   gabapentin (NEURONTIN) 300 MG capsule Take 1 capsule (300 mg total) by mouth 3 (three) times daily. 90 capsule 1   insulin glargine (LANTUS) 100 UNIT/ML injection Inject 1 mL (100 Units total) into the skin every evening. 10 mL 9   insulin lispro (HUMALOG) 100 UNIT/ML injection Inject 0.3 mLs (30 Units total) into the skin 3 (three) times daily before meals. 10 mL 11   Insulin Syringe-Needle U-100 (INSULIN SYRINGE 1CC/31GX5/16") 31G X 5/16" 1 ML MISC Use 4 times daily as directed. 100 each 3   lisinopril (ZESTRIL) 40 MG tablet Take 1 tablet (40 mg total) by mouth at bedtime. 90 tablet 1   naproxen sodium (ALEVE) 220 MG tablet Take 220 mg by mouth daily as needed (pain).     pantoprazole (PROTONIX) 40 MG tablet Take 1 tablet (40 mg total) by mouth daily. 30 tablet 1   polyethylene glycol (MIRALAX / GLYCOLAX) 17 g packet Take 17 g by mouth daily. 14 each 0   simethicone (GAS-X) 80 MG chewable tablet Chew 1 tablet (80 mg total) by mouth 4 (four) times daily as needed for flatulence. 100 tablet 2   tiZANidine (ZANAFLEX) 4 MG tablet Take 1 tablet (4 mg total) by mouth every 6 (six) hours as needed for muscle spasms. 30 tablet 0   sucralfate (CARAFATE) 1 g tablet Take 1 tablet (1 g total) by mouth 4 (four) times daily for 15 days. 60 tablet 0   No facility-administered medications prior to visit.    Allergies  Allergen Reactions   Dilaudid [Hydromorphone Hcl] Nausea And Vomiting   Morphine And Related Nausea And Vomiting   Tramadol Nausea Only    If she takes antinausea medicine with this medicine, then she can tolerate it.    ROS Review of Systems  Constitutional:  Negative for chills and fever.  Eyes:  Negative for visual disturbance.  Respiratory:  Negative for cough and shortness of breath.   Cardiovascular:  Negative for chest pain.  Gastrointestinal:  Positive for abdominal pain and nausea. Negative for blood in stool, constipation and diarrhea.   Neurological:  Negative for dizziness and headaches.      Objective:    Physical Exam Constitutional:      Appearance: Normal appearance. She is obese.  HENT:     Head: Normocephalic and atraumatic.  Eyes:     Conjunctiva/sclera: Conjunctivae normal.  Cardiovascular:     Rate and Rhythm: Normal rate and regular rhythm.     Pulses:          Dorsalis pedis pulses are 2+ on the right side and 2+ on the left side.  Pulmonary:     Effort: Pulmonary effort is normal.     Breath sounds: Normal breath sounds.  Musculoskeletal:     Right lower leg: No edema.     Left lower leg: No edema.     Right foot: Normal range of motion. No deformity, bunion, Charcot foot, foot drop or prominent metatarsal heads.     Left foot: Normal range of motion. No deformity, bunion, Charcot foot, foot drop or prominent metatarsal heads.  Feet:     Right foot:     Protective Sensation: 6 sites tested.  6 sites sensed.     Skin integrity: Skin integrity normal.     Toenail Condition: Right toenails are long.     Left foot:     Protective Sensation: 6 sites tested.  6  sites sensed.     Skin integrity: Skin integrity normal.     Toenail Condition: Left toenails are long.  Skin:    General: Skin is warm and dry.  Neurological:     General: No focal deficit present.     Mental Status: She is alert. Mental status is at baseline.  Psychiatric:        Mood and Affect: Mood normal.        Behavior: Behavior normal.     BP 128/86   Pulse 92   Temp 98.1 F (36.7 C)   Resp 18   Ht 5' 6" (1.676 m)   Wt (!) 311 lb 12.8 oz (141.4 kg)   LMP 02/22/2022 (Approximate)   SpO2 97%   BMI 50.33 kg/m  Wt Readings from Last 3 Encounters:  07/27/22 (!) 311 lb 12.8 oz (141.4 kg)  06/21/22 (!) 318 lb (144.2 kg)  04/27/22 (!) 309 lb 1.6 oz (140.2 kg)     Health Maintenance Due  Topic Date Due   MAMMOGRAM  Never done   Zoster Vaccines- Shingrix (1 of 2) Never done   FOOT EXAM  01/14/2022   INFLUENZA VACCINE   06/22/2022    There are no preventive care reminders to display for this patient.  Lab Results  Component Value Date   TSH 1.614 03/15/2022   Lab Results  Component Value Date   WBC 5.5 06/21/2022   HGB 13.7 06/21/2022   HCT 44.0 06/21/2022   MCV 94.0 06/21/2022   PLT 246 06/21/2022   Lab Results  Component Value Date   NA 139 06/21/2022   K 4.4 06/21/2022   CO2 26 06/21/2022   GLUCOSE 158 (H) 06/21/2022   BUN 17 06/21/2022   CREATININE 0.99 06/21/2022   BILITOT 0.5 06/21/2022   ALKPHOS 73 06/21/2022   AST 21 06/21/2022   ALT 20 06/21/2022   PROT 7.6 06/21/2022   ALBUMIN 3.8 06/21/2022   CALCIUM 9.0 06/21/2022   ANIONGAP 10 06/21/2022   EGFR 79 01/18/2022   Lab Results  Component Value Date   CHOL 185 12/14/2021   Lab Results  Component Value Date   HDL 41 (L) 12/14/2021   Lab Results  Component Value Date   LDLCALC 122 (H) 12/14/2021   Lab Results  Component Value Date   TRIG 109 12/14/2021   Lab Results  Component Value Date   CHOLHDL 4.5 12/14/2021   Lab Results  Component Value Date   HGBA1C 7.8 (A) 07/27/2022      Assessment & Plan:   1. Type 2 diabetes mellitus with hyperglycemia, with long-term current use of insulin ([place]): A1c 7.8 today.  We will increase her Trulicity to 1.5 mg weekly.  Continue Lantus 100 units at night, Humalog 30 units with meals, insulins refilled today.  Follow-up in 3 months to recheck A1c.  Plan to continue to increase Trulicity and start decreasing insulin to help the patient with weight loss.  Foot exam today.  - POCT HgB A1C - Dulaglutide (TRULICITY) 1.5 ZE/0.9QZ SOPN; Inject 1.5 mg into the skin once a week.  Dispense: 2 mL; Refill: 2 - insulin glargine (LANTUS) 100 UNIT/ML injection; Inject 1 mL (100 Units total) into the skin every evening.  Dispense: 10 mL; Refill: 9 - insulin lispro (HUMALOG) 100 UNIT/ML injection; Inject 0.3 mLs (30 Units total) into the skin 3 (three) times daily before meals.  Dispense:  10 mL; Refill: 11 - [facility] Diabetes Foot Exam  2. Essential hypertension: Chronic  and stable.  Blood pressure at goal today.  Continue lisinopril 40 mg daily.  3. Hyperlipidemia associated with type 2 diabetes mellitus ([place]): Chronic and stable.  Continue Lipitor 40 mg daily.  4. Uncomplicated asthma, unspecified asthma severity, unspecified whether persistent: Stable.  Refill albuterol inhaler to use as needed.  - albuterol (VENTOLIN HFA) 108 (90 Base) MCG/ACT inhaler; Inhale 2 puffs into the lungs every 6 (six) hours as needed for wheezing or shortness of breath.  Dispense: 18 g; Refill: 3  5. Gastroesophageal reflux disease, unspecified whether esophagitis present/Epigastric pain: Continues to complain of epigastric pain and acid reflux symptoms.  Increase Protonix to 40 mg twice daily, continue Carafate 1 g daily.  Referral to GI placed for potential EGD, as the patient has history of ulcers.  - Ambulatory referral to Gastroenterology - pantoprazole (PROTONIX) 40 MG tablet; Take 1 tablet (40 mg total) by mouth 2 (two) times daily.  Dispense: 180 tablet; Refill: 1   Follow-up: Return in about 3 months (around 10/26/2022).    [name], DO

## 2022-07-29 ENCOUNTER — Other Ambulatory Visit: Payer: Self-pay

## 2022-07-29 ENCOUNTER — Ambulatory Visit (INDEPENDENT_AMBULATORY_CARE_PROVIDER_SITE_OTHER): Payer: Medicaid Other | Admitting: Gastroenterology

## 2022-07-29 ENCOUNTER — Encounter: Payer: Self-pay | Admitting: Gastroenterology

## 2022-07-29 VITALS — BP 157/103 | HR 71 | Temp 98.3°F | Ht 66.0 in | Wt 312.4 lb

## 2022-07-29 DIAGNOSIS — K219 Gastro-esophageal reflux disease without esophagitis: Secondary | ICD-10-CM | POA: Diagnosis not present

## 2022-07-29 DIAGNOSIS — K760 Fatty (change of) liver, not elsewhere classified: Secondary | ICD-10-CM | POA: Diagnosis not present

## 2022-07-29 NOTE — Patient Instructions (Signed)
Fatty Liver Disease  The liver converts food into energy, removes toxic material from the blood, makes important proteins, and absorbs necessary vitamins from food. Fatty liver disease occurs when too much fat has built up in your liver cells. Fatty liver disease is also called hepatic steatosis. In many cases, fatty liver disease does not cause symptoms or problems. It is often diagnosed when tests are being done for other reasons. However, over time, fatty liver can cause inflammation that may lead to more serious liver problems, such as scarring of the liver (cirrhosis) and liver failure. Fatty liver is associated with insulin resistance, increased body fat, high blood pressure (hypertension), and high cholesterol. These are features of metabolic syndrome and increase your risk for stroke, diabetes, and heart disease. What are the causes? This condition may be caused by components of metabolic syndrome: Obesity. Insulin resistance. High cholesterol. Other causes: Alcohol abuse. Poor nutrition. Cushing syndrome. Pregnancy. Certain drugs. Poisons. Some viral infections. What increases the risk? You are more likely to develop this condition if you: Abuse alcohol. Are overweight. Have diabetes. Have hepatitis. Have a high triglyceride level. Are pregnant. What are the signs or symptoms? Fatty liver disease often does not cause symptoms. If symptoms do develop, they can include: Fatigue and weakness. Weight loss. Confusion. Nausea, vomiting, or abdominal pain. Yellowing of your skin and the white parts of your eyes (jaundice). Itchy skin. How is this diagnosed? This condition may be diagnosed by: A physical exam and your medical history. Blood tests. Imaging tests, such as an ultrasound, CT scan, or MRI. A liver biopsy. A small sample of liver tissue is removed using a needle. The sample is then looked at under a microscope. How is this treated? Fatty liver disease is often  caused by other health conditions. Treatment for fatty liver may involve medicines and lifestyle changes to manage conditions such as: Alcoholism. High cholesterol. Diabetes. Being overweight or obese. Follow these instructions at home:  Do not drink alcohol. If you have trouble quitting, ask your health care provider how to safely quit with the help of medicine or a supervised program. This is important to keep your condition from getting worse. Eat a healthy diet as told by your health care provider. Ask your health care provider about working with a dietitian to develop an eating plan. Exercise regularly. This can help you lose weight and control your cholesterol and diabetes. Talk to your health care provider about an exercise plan and which activities are best for you. Take over-the-counter and prescription medicines only as told by your health care provider. Keep all follow-up visits. This is important. Contact a health care provider if: You have trouble controlling your: Blood sugar. This is especially important if you have diabetes. Cholesterol. Drinking of alcohol. Get help right away if: You have abdominal pain. You have jaundice. You have nausea and are vomiting. You vomit blood or material that looks like coffee grounds. You have stools that are black, tar-like, or bloody. Summary Fatty liver disease develops when too much fat builds up in the cells of your liver. Fatty liver disease often causes no symptoms or problems. However, over time, fatty liver can cause inflammation that may lead to more serious liver problems, such as scarring of the liver (cirrhosis). You are more likely to develop this condition if you abuse alcohol, are pregnant, are overweight, have diabetes, have hepatitis, or have high triglyceride or cholesterol levels. Contact your health care provider if you have trouble controlling your blood   sugar, cholesterol, or drinking of alcohol. This information is  not intended to replace advice given to you by your health care provider. Make sure you discuss any questions you have with your health care provider. Document Revised: 08/21/2020 Document Reviewed: 08/21/2020 Elsevier Patient Education  [place]. Gastroesophageal Reflux Disease, Adult  Gastroesophageal reflux (GER) happens when acid from the stomach flows up into the tube that connects the mouth and the stomach (esophagus). Normally, food travels down the esophagus and stays in the stomach to be digested. With GER, food and stomach acid sometimes move back up into the esophagus. You may have a disease called gastroesophageal reflux disease (GERD) if the reflux: Happens often. Causes frequent or very bad symptoms. Causes problems such as damage to the esophagus. When this happens, the esophagus becomes sore and swollen. Over time, GERD can make small holes (ulcers) in the lining of the esophagus. What are the causes? This condition is caused by a problem with the muscle between the esophagus and the stomach. When this muscle is weak or not normal, it does not close properly to keep food and acid from coming back up from the stomach. The muscle can be weak because of: Tobacco use. Pregnancy. Having a certain type of hernia (hiatal hernia). Alcohol use. Certain foods and drinks, such as coffee, chocolate, onions, and peppermint. What increases the risk? Being overweight. Having a disease that affects your connective tissue. Taking NSAIDs, such a ibuprofen. What are the signs or symptoms? Heartburn. Difficult or painful swallowing. The feeling of having a lump in the throat. A bitter taste in the mouth. Bad breath. Having a lot of saliva. Having an upset or bloated stomach. Burping. Chest pain. Different conditions can cause chest pain. Make sure you see your doctor if you have chest pain. Shortness of breath or wheezing. A long-term cough or a cough at night. Wearing away  of the surface of teeth (tooth enamel). Weight loss. How is this treated? Making changes to your diet. Taking medicine. Having surgery. Treatment will depend on how bad your symptoms are. Follow these instructions at home: Eating and drinking  Follow a diet as told by your doctor. You may need to avoid foods and drinks such as: Coffee and tea, with or without caffeine. Drinks that contain alcohol. Energy drinks and sports drinks. Bubbly (carbonated) drinks or sodas. Chocolate and cocoa. Peppermint and mint flavorings. Garlic and onions. Horseradish. Spicy and acidic foods. These include peppers, chili powder, curry powder, vinegar, hot sauces, and BBQ sauce. Citrus fruit juices and citrus fruits, such as oranges, lemons, and limes. Tomato-based foods. These include red sauce, chili, salsa, and pizza with red sauce. Fried and fatty foods. These include donuts, french fries, potato chips, and high-fat dressings. High-fat meats. These include hot dogs, rib eye steak, sausage, ham, and bacon. High-fat dairy items, such as whole milk, butter, and cream cheese. Eat small meals often. Avoid eating large meals. Avoid drinking large amounts of liquid with your meals. Avoid eating meals during the 2-3 hours before bedtime. Avoid lying down right after you eat. Do not exercise right after you eat. Lifestyle  Do not smoke or use any products that contain nicotine or tobacco. If you need help quitting, ask your doctor. Try to lower your stress. If you need help doing this, ask your doctor. If you are overweight, lose an amount of weight that is healthy for you. Ask your doctor about a safe weight loss goal. General instructions Pay attention to any  changes in your symptoms. Take over-the-counter and prescription medicines only as told by your doctor. Do not take aspirin, ibuprofen, or other NSAIDs unless your doctor says it is okay. Wear loose clothes. Do not wear anything tight around your  waist. Raise (elevate) the head of your bed about 6 inches (15 cm). You may need to use a wedge to do this. Avoid bending over if this makes your symptoms worse. Keep all follow-up visits. Contact a doctor if: You have new symptoms. You lose weight and you do not know why. You have trouble swallowing or it hurts to swallow. You have wheezing or a cough that keeps happening. You have a hoarse voice. Your symptoms do not get better with treatment. Get help right away if: You have sudden pain in your arms, neck, jaw, teeth, or back. You suddenly feel sweaty, dizzy, or light-headed. You have chest pain or shortness of breath. You vomit and the vomit is green, yellow, or black, or it looks like blood or coffee grounds. You faint. Your poop (stool) is red, bloody, or black. You cannot swallow, drink, or eat. These symptoms may represent a serious problem that is an emergency. Do not wait to see if the symptoms will go away. Get medical help right away. Call your local emergency services (911 in the [place] Do not drive yourself to the hospital. Summary If a person has gastroesophageal reflux disease (GERD), food and stomach acid move back up into the esophagus and cause symptoms or problems such as damage to the esophagus. Treatment will depend on how bad your symptoms are. Follow a diet as told by your doctor. Take all medicines only as told by your doctor. This information is not intended to replace advice given to you by your health care provider. Make sure you discuss any questions you have with your health care provider. Document Revised: 05/19/2020 Document Reviewed: 05/19/2020 Elsevier Patient Education  [place].

## 2022-07-29 NOTE — Progress Notes (Signed)
Cephas Darby, MD [address]  [place]  [place], [place] [address]  Main: [phone]  Fax: [phone]    Gastroenterology Consultation  Referring Provider:     [name], DO Primary Care Physician:  [name], DO Primary Gastroenterologist:  Dr. Cephas Darby Reason for Consultation: Chronic GERD, fatty liver        HPI:   Brenda Hicks is a 56 y.o. female referred by Dr. [name], DO  for consultation & management of chronic GERD and fatty liver.  Patient has history of metabolic syndrome, diabetes on insulin.  Patient reports that for last 6 months, she has been experiencing severe burning in her chest as well as in the upper abdomen associated with postprandial urgency.  Her heartburn is more or less constant with no particular relation to food.  She does report that she has been going through stress in her life lately which is also triggering her reflux symptoms.  Her PCP started her on Protonix low-dose, increased to 40 mg daily, patient did not pick up the prescription yet.  She is also taking sucralfate twice daily along with Protonix once a day.  Patient denies any difficulty swallowing.  She does have history of fatty liver, most recent LFTs have been normal.  She has stopped drinking carbonated beverages, drinks apple juice daily.  Labs from 06/21/2022 revealed normal CBC, CMP, most recent hemoglobin A1c is 7.8 on 07/27/2022.  Patient quit smoking in 2018, does not drink alcohol  NSAIDs: None  Antiplts/Anticoagulants/Anti thrombotics: None none  GI Procedures: Colonoscopy 02/26/2021 - Diverticulosis in the entire examined colon. - One 5 mm polyp in the rectum, removed with a cold snare. Resected and retrieved. - The examination was otherwise normal on direct and retroflexion views.  DIAGNOSIS:  A. RECTAL POLYP; COLD SNARE:  - HYPERPLASTIC POLYP.  - NEGATIVE FOR DYSPLASIA AND MALIGNANCY.   Past Medical History:  Diagnosis Date    Anemia    vitamin d deficiency   Arthritis    Asthma    WELL CONTROLLED   Cancer of ear    skin cancer left ear   COPD (chronic obstructive pulmonary disease) ([place])    Diabetes mellitus without complication ([facility])    Fatty liver    Hypertension    Kidney cysts    per patient, never had   Renal disorder    Sleep apnea    DOES NOT USE CPAP. waiting for new machine and a new sleep study   Stroke [facility]) May or June 2019   TIA. no residual symptoms    Past Surgical History:  Procedure Laterality Date   ABDOMINAL HYSTERECTOMY     ANTERIOR CERVICAL DECOMP/DISCECTOMY FUSION N/A 08/09/2018   Procedure: ANTERIOR CERVICAL DECOMPRESSION/DISCECTOMY FUSION 1 LEVEL- C4-5;  Surgeon: Meade Maw, MD;  Location: [facility] ORS;  Service: Neurosurgery;  Laterality: N/A;   BACK SURGERY     NECK   COLONOSCOPY WITH PROPOFOL N/A 02/26/2021   Procedure: COLONOSCOPY WITH PROPOFOL;  Surgeon: Jonathon Bellows, MD;  Location: [facility] ENDOSCOPY;  Service: Endoscopy;  Laterality: N/A;   DILATATION & CURETTAGE/HYSTEROSCOPY WITH MYOSURE N/A 01/19/2021   Procedure: DILATATION & CURETTAGE/HYSTEROSCOPY;  Surgeon: Rubie Maid, MD;  Location: [facility] ORS;  Service: Gynecology;  Laterality: N/A;   DILATION AND CURETTAGE OF UTERUS     ENDOMETRIAL ABLATION N/A 01/19/2021   Procedure: ENDOMETRIAL ABLATION, MINERVA;  Surgeon: Rubie Maid, MD;  Location: [facility] ORS;  Service: Gynecology;  Laterality: N/A;   ENDOMETRIAL BIOPSY  benign   EXPLORATORY LAPAROTOMY  1992   REMOVAL OF RUPTURED ECTOPIC   HAND SURGERY Right 1998   cyst removed   HERNIA REPAIR  9449   UMBILICAL   JOINT REPLACEMENT Right 2014   TKR   KNEE ARTHROSCOPY Right 2012   KNEE SURGERY Right 2014   total knee replacement   ROBOTIC ASSISTED LAPAROSCOPIC HYSTERECTOMY AND SALPINGECTOMY Bilateral 03/15/2022   Procedure: XI ROBOTIC ASSISTED LAPAROSCOPIC HYSTERECTOMY;  Surgeon: Rubie Maid, MD;  Location: [facility] ORS;  Service: Gynecology;  Laterality: Bilateral;    SHOULDER ARTHROSCOPY WITH BICEPSTENOTOMY Left 12/14/2016   Procedure: SHOULDER ARTHROSCOPY WITH BICEPSTENOTOMY;  Surgeon: Corky Mull, MD;  Location: [facility] ORS;  Service: Orthopedics;  Laterality: Left;   SHOULDER ARTHROSCOPY WITH OPEN ROTATOR CUFF REPAIR Left 12/14/2016   Procedure: SHOULDER ARTHROSCOPY WITH OPEN ROTATOR CUFF REPAIR AND ARTHROSCOPIC ROTATOR CUFF REPAIR;  Surgeon: Corky Mull, MD;  Location: [facility] ORS;  Service: Orthopedics;  Laterality: Left;   SHOULDER ARTHROSCOPY WITH ROTATOR CUFF REPAIR Right 01/04/2019   Procedure: SHOULDER ARTHROSCOPY WITH ROTATOR CUFF REPAIR;  Surgeon: Corky Mull, MD;  Location: [facility] ORS;  Service: Orthopedics;  Laterality: Right;   SHOULDER ARTHROSCOPY WITH SUBACROMIAL DECOMPRESSION Left 12/14/2016   Procedure: SHOULDER ARTHROSCOPY WITH SUBACROMIAL DECOMPRESSION;  Surgeon: Corky Mull, MD;  Location: [facility] ORS;  Service: Orthopedics;  Laterality: Left;   SHOULDER ARTHROSCOPY WITH SUBACROMIAL DECOMPRESSION AND BICEP TENDON REPAIR Right 01/04/2019   Procedure: SHOULDER ARTHROSCOPY WITH DEBRIDEMENT AND SUBACROMIAL DECOMPRESSION-RIGHT;  Surgeon: Corky Mull, MD;  Location: [facility] ORS;  Service: Orthopedics;  Laterality: Right;   TUBAL LIGATION       Current Outpatient Medications:    albuterol (VENTOLIN HFA) 108 (90 Base) MCG/ACT inhaler, Inhale 2 puffs into the lungs every 6 (six) hours as needed for wheezing or shortness of breath., Disp: 18 g, Rfl: 3   atorvastatin (LIPITOR) 40 MG tablet, Take 1 tablet (40 mg total) by mouth daily., Disp: 90 tablet, Rfl: 3   Continuous Blood Gluc Sensor (FREESTYLE LIBRE 3 SENSOR) MISC, 1 each by Does not apply route 4 (four) times daily. Place 1 sensor on the skin every 14 days. Use to check glucose continuously, Disp: 2 each, Rfl: 3   docusate sodium (COLACE) 100 MG capsule, Take 1 capsule (100 mg total) by mouth 2 (two) times daily as needed., Disp: 30 capsule, Rfl: 2   Dulaglutide (TRULICITY) 1.5 QP/5.9FM SOPN,  Inject 1.5 mg into the skin once a week., Disp: 2 mL, Rfl: 2   gabapentin (NEURONTIN) 300 MG capsule, Take 1 capsule (300 mg total) by mouth 3 (three) times daily., Disp: 90 capsule, Rfl: 1   insulin glargine (LANTUS) 100 UNIT/ML injection, Inject 1 mL (100 Units total) into the skin every evening., Disp: 10 mL, Rfl: 9   insulin lispro (HUMALOG) 100 UNIT/ML injection, Inject 0.3 mLs (30 Units total) into the skin 3 (three) times daily before meals., Disp: 10 mL, Rfl: 11   Insulin Syringe-Needle U-100 (INSULIN SYRINGE 1CC/31GX5/16") 31G X 5/16" 1 ML MISC, Use 4 times daily as directed., Disp: 100 each, Rfl: 3   lisinopril (ZESTRIL) 40 MG tablet, Take 1 tablet (40 mg total) by mouth at bedtime., Disp: 90 tablet, Rfl: 1   polyethylene glycol (MIRALAX / GLYCOLAX) 17 g packet, Take 17 g by mouth daily., Disp: 14 each, Rfl: 0   simethicone (GAS-X) 80 MG chewable tablet, Chew 1 tablet (80 mg total) by mouth 4 (four) times daily as needed for flatulence., Disp: 100  tablet, Rfl: 2   sucralfate (CARAFATE) 1 g tablet, Take 1 tablet (1 g total) by mouth 4 (four) times daily for 15 days., Disp: 60 tablet, Rfl: 0   tiZANidine (ZANAFLEX) 4 MG tablet, Take 1 tablet (4 mg total) by mouth every 6 (six) hours as needed for muscle spasms., Disp: 30 tablet, Rfl: 0   Family History  Problem Relation Age of Onset   Hypertension Mother    Thyroid disease Mother    Lupus Mother    Congestive Heart Failure Mother    Hypertension Brother    CAD Maternal Grandmother    Breast cancer Neg Hx    Ovarian cancer Neg Hx    Colon cancer Neg Hx      Social History   Tobacco Use   Smoking status: Former    Packs/day: 2.00    Years: 25.00    Total pack years: 50.00    Types: Cigarettes    Quit date: 06/09/2017    Years since quitting: 5.1   Smokeless tobacco: Never  Vaping Use   Vaping Use: Never used  Substance Use Topics   Alcohol use: No   Drug use: Yes    Frequency: 7.0 times per week    Types: Marijuana     Comment: Pt. uses marjiuana only when she has severe back and side pain    Allergies as of 07/29/2022 - Review Complete 07/29/2022  Allergen Reaction Noted   Dilaudid [hydromorphone hcl] Nausea And Vomiting 01/19/2021   Morphine and related Nausea And Vomiting 01/04/2019   Tramadol Nausea Only 12/29/2018    Review of Systems:    All systems reviewed and negative except where noted in HPI.   Physical Exam:  BP (!) 157/103 (BP Location: Left Arm, Patient Position: Sitting, Cuff Size: Large)   Pulse 71   Temp 98.3 F (36.8 C) (Oral)   Ht '5\' 6"'$  (1.676 m)   Wt (!) 312 lb 6 oz (141.7 kg)   LMP 02/22/2022 (Approximate)   BMI 50.42 kg/m  Patient's last menstrual period was 02/22/2022 (approximate).  General:   Alert,  Well-developed, well-nourished, pleasant and cooperative in NAD Head:  Normocephalic and atraumatic. Eyes:  Sclera clear, no icterus.   Conjunctiva pink. Ears:  Normal auditory acuity. Nose:  No deformity, discharge, or lesions. Mouth:  No deformity or lesions,oropharynx pink & moist. Neck:  Supple; no masses or thyromegaly. Lungs:  Respirations even and unlabored.  Clear throughout to auscultation.   No wheezes, crackles, or rhonchi. No acute distress. Heart:  Regular rate and rhythm; no murmurs, clicks, rubs, or gallops. Abdomen:  Normal bowel sounds. Soft, non-tender and non-distended without masses, hepatosplenomegaly or hernias noted.  No guarding or rebound tenderness.   Rectal: Not performed Msk:  Symmetrical without gross deformities. Good, equal movement & strength bilaterally. Pulses:  Normal pulses noted. Extremities:  No clubbing or edema.  No cyanosis. Neurologic:  Alert and oriented x3;  grossly normal neurologically. Skin:  Intact without significant lesions or rashes. No jaundice. Psych:  Alert and cooperative. Normal mood and affect.  Imaging Studies: Reviewed  Assessment and Plan:   Brenda Hicks is a 56 y.o. female with history of metabolic  syndrome, fatty liver, insulin-dependent diabetes is seen in consultation for chronic GERD symptoms with recent flareup  Chronic GERD Recommend upper endoscopy with gastric and esophageal biopsies Recommend increase Protonix to 40 mg p.o. twice daily for for meals Discussed about antireflux lifestyle, information provided  Fatty liver LFTs have been normal  Discussed about healthy lifestyle, risk of progression of fatty liver to cirrhosis, information provided Discussed about tight control of diabetes   Follow up based on the EGD findings   Cephas Darby, MD

## 2022-07-30 ENCOUNTER — Emergency Department: Payer: Medicaid Other

## 2022-07-30 ENCOUNTER — Other Ambulatory Visit: Payer: Self-pay

## 2022-07-30 ENCOUNTER — Emergency Department
Admission: EM | Admit: 2022-07-30 | Discharge: 2022-07-30 | Disposition: A | Discharge status: Home / Self Care | Payer: Medicaid Other | Attending: Emergency Medicine | Admitting: Emergency Medicine

## 2022-07-30 DIAGNOSIS — R519 Headache, unspecified: Secondary | ICD-10-CM | POA: Diagnosis present

## 2022-07-30 DIAGNOSIS — E119 Type 2 diabetes mellitus without complications: Secondary | ICD-10-CM | POA: Diagnosis not present

## 2022-07-30 DIAGNOSIS — Y92410 Unspecified street and highway as the place of occurrence of the external cause: Secondary | ICD-10-CM | POA: Diagnosis not present

## 2022-07-30 DIAGNOSIS — I10 Essential (primary) hypertension: Secondary | ICD-10-CM | POA: Insufficient documentation

## 2022-07-30 DIAGNOSIS — M542 Cervicalgia: Secondary | ICD-10-CM | POA: Diagnosis not present

## 2022-07-30 DIAGNOSIS — Y9389 Activity, other specified: Secondary | ICD-10-CM | POA: Insufficient documentation

## 2022-07-30 DIAGNOSIS — M25562 Pain in left knee: Secondary | ICD-10-CM | POA: Diagnosis not present

## 2022-07-30 MED ORDER — OXYCODONE-ACETAMINOPHEN 5-325 MG PO TABS
1.0000 | ORAL_TABLET | Freq: Once | ORAL | Status: AC
  Administered 2022-07-30: 1 via ORAL
  Filled 2022-07-30: qty 1

## 2022-07-30 NOTE — ED Notes (Signed)
Pt assisted to toilet- pt able to ambulate without assistance with slight limp. Pt given refreshment per request.
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
## 2022-07-30 NOTE — ED Notes (Signed)
Patient asked for a remote for the tv. Patient was given a remote.
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
## 2022-08-09 ENCOUNTER — Ambulatory Visit: Payer: Self-pay | Admitting: *Deleted
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
## 2022-08-12 ENCOUNTER — Ambulatory Visit: Payer: Medicaid Other | Admitting: Internal Medicine

## 2022-08-14 ENCOUNTER — Emergency Department
Admission: RE | Admit: 2022-08-14 | Discharge: 2022-08-14 | Disposition: A | Discharge status: Home / Self Care | Payer: Medicaid Other | Source: Ambulatory Visit | Attending: Student | Admitting: Student

## 2022-08-14 ENCOUNTER — Emergency Department: Payer: Medicaid Other

## 2022-08-14 ENCOUNTER — Encounter: Payer: Self-pay | Admitting: Intensive Care

## 2022-08-14 ENCOUNTER — Emergency Department
Admission: EM | Admit: 2022-08-14 | Discharge: 2022-08-14 | Disposition: A | Discharge status: Home / Self Care | Payer: Medicaid Other | Attending: Student in an Organized Health Care Education/Training Program | Admitting: Student in an Organized Health Care Education/Training Program

## 2022-08-14 ENCOUNTER — Other Ambulatory Visit: Payer: Self-pay

## 2022-08-14 DIAGNOSIS — M549 Dorsalgia, unspecified: Secondary | ICD-10-CM | POA: Diagnosis present

## 2022-08-14 DIAGNOSIS — E1142 Type 2 diabetes mellitus with diabetic polyneuropathy: Secondary | ICD-10-CM | POA: Diagnosis not present

## 2022-08-14 DIAGNOSIS — M546 Pain in thoracic spine: Secondary | ICD-10-CM | POA: Insufficient documentation

## 2022-08-14 DIAGNOSIS — R519 Headache, unspecified: Secondary | ICD-10-CM | POA: Insufficient documentation

## 2022-08-14 DIAGNOSIS — Y92410 Unspecified street and highway as the place of occurrence of the external cause: Secondary | ICD-10-CM | POA: Diagnosis not present

## 2022-08-14 DIAGNOSIS — Z96659 Presence of unspecified artificial knee joint: Secondary | ICD-10-CM | POA: Diagnosis not present

## 2022-08-14 DIAGNOSIS — G8911 Acute pain due to trauma: Secondary | ICD-10-CM | POA: Insufficient documentation

## 2022-08-14 DIAGNOSIS — M545 Low back pain, unspecified: Secondary | ICD-10-CM | POA: Insufficient documentation

## 2022-08-14 DIAGNOSIS — Z87891 Personal history of nicotine dependence: Secondary | ICD-10-CM | POA: Diagnosis not present

## 2022-08-14 DIAGNOSIS — I10 Essential (primary) hypertension: Secondary | ICD-10-CM | POA: Insufficient documentation

## 2022-08-14 DIAGNOSIS — M542 Cervicalgia: Secondary | ICD-10-CM | POA: Diagnosis not present

## 2022-08-14 DIAGNOSIS — H9313 Tinnitus, bilateral: Secondary | ICD-10-CM | POA: Insufficient documentation

## 2022-08-14 LAB — BASIC METABOLIC PANEL
Anion gap: 8 (ref 5–15)
BUN: 12 mg/dL (ref 6–20)
CO2: 29 mmol/L (ref 22–32)
Calcium: 9 mg/dL (ref 8.9–10.3)
Chloride: 101 mmol/L (ref 98–111)
Creatinine, Ser: 1 mg/dL (ref 0.44–1.00)
GFR, Estimated: >60 mL/min (ref >60)
Glucose, Bld: 182 mg/dL — ABNORMAL HIGH (ref 70–99)
Potassium: 4.5 mmol/L (ref 3.5–5.1)
Sodium: 138 mmol/L (ref 135–145)

## 2022-08-14 MED ORDER — SODIUM CHLORIDE 0.9 % IV BOLUS
1000.0000 mL | Freq: Once | INTRAVENOUS | Status: AC
  Administered 2022-08-14: 1000 mL via INTRAVENOUS

## 2022-08-14 MED ORDER — LIDOCAINE 5 % EX PTCH: 1.0000 | MEDICATED_PATCH | Freq: Two times a day (BID) | CUTANEOUS | 0 refills | Status: AC

## 2022-08-14 MED ORDER — KETOROLAC TROMETHAMINE 15 MG/ML IJ SOLN
15.0000 mg | Freq: Once | INTRAMUSCULAR | Status: AC
  Administered 2022-08-14: 15 mg via INTRAMUSCULAR

## 2022-08-14 MED ORDER — LIDOCAINE 5 % EX PTCH
1.0000 | MEDICATED_PATCH | CUTANEOUS | Status: DC
  Administered 2022-08-14: 1 via TRANSDERMAL
  Filled 2022-08-14: qty 1

## 2022-08-14 MED ORDER — CYCLOBENZAPRINE HCL 10 MG PO TABS
5.0000 mg | ORAL_TABLET | Freq: Once | ORAL | Status: AC
  Administered 2022-08-14: 5 mg via ORAL
  Filled 2022-08-14: qty 1

## 2022-08-14 MED ORDER — KETOROLAC TROMETHAMINE 15 MG/ML IJ SOLN
15.0000 mg | Freq: Once | INTRAMUSCULAR | Status: DC
  Filled 2022-08-14: qty 1

## 2022-08-14 MED ORDER — CYCLOBENZAPRINE HCL 5 MG PO TABS
5.0000 mg | ORAL_TABLET | Freq: Every day | ORAL | 0 refills | Status: DC
Start: 2022-08-14 — End: 2022-10-17

## 2022-08-14 MED ORDER — IOHEXOL 350 MG/ML SOLN
75.0000 mL | Freq: Once | INTRAVENOUS | Status: AC | PRN
  Administered 2022-08-14: 75 mL via INTRAVENOUS

## 2022-08-14 NOTE — Discharge Instructions (Signed)
Your x-rays and CT scans are reassuring.  You may continue to take the medicines as prescribed to help with your symptoms.  However, do not drive, operate heavy machinery, or perform any tests or require concentration while taking the Flexeril as it can make you sleepy or impair your judgment.  Please return for any new, worsening, or change in symptoms or other concerns.  It was a pleasure caring for you today.
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
## 2022-08-24 ENCOUNTER — Ambulatory Visit: Payer: Medicaid Other | Admitting: Anesthesiology

## 2022-08-24 ENCOUNTER — Encounter
Admission: RE | Disposition: A | Discharge status: Home / Self Care | Payer: Self-pay | Source: Home / Self Care | Attending: Gastroenterology

## 2022-08-24 ENCOUNTER — Encounter: Payer: Self-pay | Admitting: Gastroenterology

## 2022-08-24 ENCOUNTER — Ambulatory Visit
Admission: RE | Admit: 2022-08-24 | Discharge: 2022-08-24 | Disposition: A | Discharge status: Home / Self Care | Payer: Medicaid Other | Attending: Gastroenterology | Admitting: Gastroenterology

## 2022-08-24 DIAGNOSIS — J449 Chronic obstructive pulmonary disease, unspecified: Secondary | ICD-10-CM | POA: Diagnosis not present

## 2022-08-24 DIAGNOSIS — Z96651 Presence of right artificial knee joint: Secondary | ICD-10-CM | POA: Diagnosis not present

## 2022-08-24 DIAGNOSIS — R1013 Epigastric pain: Secondary | ICD-10-CM | POA: Insufficient documentation

## 2022-08-24 DIAGNOSIS — K219 Gastro-esophageal reflux disease without esophagitis: Secondary | ICD-10-CM | POA: Diagnosis not present

## 2022-08-24 DIAGNOSIS — G4730 Sleep apnea, unspecified: Secondary | ICD-10-CM | POA: Insufficient documentation

## 2022-08-24 DIAGNOSIS — I10 Essential (primary) hypertension: Secondary | ICD-10-CM | POA: Diagnosis not present

## 2022-08-24 DIAGNOSIS — Z8673 Personal history of transient ischemic attack (TIA), and cerebral infarction without residual deficits: Secondary | ICD-10-CM | POA: Insufficient documentation

## 2022-08-24 DIAGNOSIS — E119 Type 2 diabetes mellitus without complications: Secondary | ICD-10-CM | POA: Insufficient documentation

## 2022-08-24 DIAGNOSIS — Z85828 Personal history of other malignant neoplasm of skin: Secondary | ICD-10-CM | POA: Insufficient documentation

## 2022-08-24 DIAGNOSIS — Z794 Long term (current) use of insulin: Secondary | ICD-10-CM | POA: Diagnosis not present

## 2022-08-24 DIAGNOSIS — K259 Gastric ulcer, unspecified as acute or chronic, without hemorrhage or perforation: Secondary | ICD-10-CM

## 2022-08-24 DIAGNOSIS — Z87891 Personal history of nicotine dependence: Secondary | ICD-10-CM | POA: Diagnosis not present

## 2022-08-24 DIAGNOSIS — K760 Fatty (change of) liver, not elsewhere classified: Secondary | ICD-10-CM | POA: Insufficient documentation

## 2022-08-24 HISTORY — PX: ESOPHAGOGASTRODUODENOSCOPY (EGD) WITH PROPOFOL: SHX5813

## 2022-08-24 LAB — GLUCOSE, CAPILLARY: Glucose-Capillary: 162 mg/dL — ABNORMAL HIGH (ref 70–99)

## 2022-08-24 SURGERY — ESOPHAGOGASTRODUODENOSCOPY (EGD) WITH PROPOFOL
Anesthesia: General

## 2022-08-24 MED ORDER — OMEPRAZOLE 40 MG PO CPDR: 40.0000 mg | DELAYED_RELEASE_CAPSULE | Freq: Every day | ORAL | 1 refills | Status: DC

## 2022-08-24 MED ORDER — GLYCOPYRROLATE 0.2 MG/ML IJ SOLN
INTRAMUSCULAR | Status: AC
  Filled 2022-08-24: qty 1

## 2022-08-24 MED ORDER — PROPOFOL 10 MG/ML IV BOLUS
INTRAVENOUS | Status: DC | PRN
  Administered 2022-08-24: 100 mg via INTRAVENOUS

## 2022-08-24 MED ORDER — LIDOCAINE HCL (CARDIAC) PF 100 MG/5ML IV SOSY
PREFILLED_SYRINGE | INTRAVENOUS | Status: DC | PRN
  Administered 2022-08-24: 100 mg via INTRAVENOUS

## 2022-08-24 MED ORDER — DEXMEDETOMIDINE HCL 200 MCG/2ML IV SOLN
INTRAVENOUS | Status: DC | PRN
  Administered 2022-08-24: 8 ug via INTRAVENOUS

## 2022-08-24 MED ORDER — GLYCOPYRROLATE 0.2 MG/ML IJ SOLN
INTRAMUSCULAR | Status: DC | PRN
  Administered 2022-08-24: .1 mg via INTRAVENOUS

## 2022-08-24 MED ORDER — SODIUM CHLORIDE 0.9 % IV SOLN: INTRAVENOUS | Status: DC

## 2022-08-24 MED ORDER — PROPOFOL 500 MG/50ML IV EMUL
INTRAVENOUS | Status: DC | PRN
  Administered 2022-08-24: 150 ug/kg/min via INTRAVENOUS

## 2022-08-24 NOTE — Transfer of Care (Signed)
Immediate Anesthesia Transfer of Care Note  Patient: Brenda Hicks  Procedure(s) Performed: ESOPHAGOGASTRODUODENOSCOPY (EGD) WITH PROPOFOL  Patient Location: Endoscopy Unit  Anesthesia Type:General  Level of Consciousness: drowsy  Airway & Oxygen Therapy: Patient Spontanous Breathing  Post-op Assessment: Report given to RN and Post -op Vital signs reviewed and stable  Post vital signs: Reviewed and stable  Last Vitals:  Vitals Value Taken Time  BP 130/82 08/24/22 0902  Temp    Pulse 80 08/24/22 0902  Resp 38 08/24/22 0902  SpO2 100 % 08/24/22 0902  Vitals shown include unvalidated device data.  Last Pain:  Vitals:   08/24/22 0808  TempSrc: Temporal  PainSc: 0-No pain         Complications: No notable events documented.

## 2022-08-24 NOTE — H&P (Signed)
Cephas Darby, MD [address]  [place]  [place], [place] [address]  Main: [phone]  Fax: [phone] Pager: [phone]  Primary Care Physician:  [name], DO Primary Gastroenterologist:  Dr. Cephas Darby  Pre-Procedure History & Physical: HPI:  Brenda Hicks is a 56 y.o. female is here for an endoscopy.   Past Medical History:  Diagnosis Date   Anemia    vitamin d deficiency   Arthritis    Asthma    WELL CONTROLLED   Cancer of ear    skin cancer left ear   COPD (chronic obstructive pulmonary disease) ([place])    Diabetes mellitus without complication ([place])    Fatty liver    Hypertension    Kidney cysts    per patient, never had   Renal disorder    Sleep apnea    DOES NOT USE CPAP. waiting for new machine and a new sleep study   Stroke [facility]) May or June 2019   TIA. no residual symptoms    Past Surgical History:  Procedure Laterality Date   ABDOMINAL HYSTERECTOMY     ANTERIOR CERVICAL DECOMP/DISCECTOMY FUSION N/A 08/09/2018   Procedure: ANTERIOR CERVICAL DECOMPRESSION/DISCECTOMY FUSION 1 LEVEL- C4-5;  Surgeon: Meade Maw, MD;  Location: [facility] ORS;  Service: Neurosurgery;  Laterality: N/A;   BACK SURGERY     NECK   COLONOSCOPY WITH PROPOFOL N/A 02/26/2021   Procedure: COLONOSCOPY WITH PROPOFOL;  Surgeon: Jonathon Bellows, MD;  Location: [facility] ENDOSCOPY;  Service: Endoscopy;  Laterality: N/A;   DILATATION & CURETTAGE/HYSTEROSCOPY WITH MYOSURE N/A 01/19/2021   Procedure: DILATATION & CURETTAGE/HYSTEROSCOPY;  Surgeon: Rubie Maid, MD;  Location: [facility] ORS;  Service: Gynecology;  Laterality: N/A;   DILATION AND CURETTAGE OF UTERUS     ENDOMETRIAL ABLATION N/A 01/19/2021   Procedure: ENDOMETRIAL ABLATION, MINERVA;  Surgeon: Rubie Maid, MD;  Location: [facility] ORS;  Service: Gynecology;  Laterality: N/A;   ENDOMETRIAL BIOPSY     benign   EXPLORATORY LAPAROTOMY  1992   REMOVAL OF RUPTURED ECTOPIC   HAND SURGERY Right 1998   cyst removed    HERNIA REPAIR  1962   UMBILICAL   JOINT REPLACEMENT Right 2014   TKR   KNEE ARTHROSCOPY Right 2012   KNEE SURGERY Right 2014   total knee replacement   ROBOTIC ASSISTED LAPAROSCOPIC HYSTERECTOMY AND SALPINGECTOMY Bilateral 03/15/2022   Procedure: XI ROBOTIC ASSISTED LAPAROSCOPIC HYSTERECTOMY;  Surgeon: Rubie Maid, MD;  Location: [facility] ORS;  Service: Gynecology;  Laterality: Bilateral;   SHOULDER ARTHROSCOPY WITH BICEPSTENOTOMY Left 12/14/2016   Procedure: SHOULDER ARTHROSCOPY WITH BICEPSTENOTOMY;  Surgeon: Corky Mull, MD;  Location: [facility] ORS;  Service: Orthopedics;  Laterality: Left;   SHOULDER ARTHROSCOPY WITH OPEN ROTATOR CUFF REPAIR Left 12/14/2016   Procedure: SHOULDER ARTHROSCOPY WITH OPEN ROTATOR CUFF REPAIR AND ARTHROSCOPIC ROTATOR CUFF REPAIR;  Surgeon: Corky Mull, MD;  Location: [facility] ORS;  Service: Orthopedics;  Laterality: Left;   SHOULDER ARTHROSCOPY WITH ROTATOR CUFF REPAIR Right 01/04/2019   Procedure: SHOULDER ARTHROSCOPY WITH ROTATOR CUFF REPAIR;  Surgeon: Corky Mull, MD;  Location: [facility] ORS;  Service: Orthopedics;  Laterality: Right;   SHOULDER ARTHROSCOPY WITH SUBACROMIAL DECOMPRESSION Left 12/14/2016   Procedure: SHOULDER ARTHROSCOPY WITH SUBACROMIAL DECOMPRESSION;  Surgeon: Corky Mull, MD;  Location: [facility] ORS;  Service: Orthopedics;  Laterality: Left;   SHOULDER ARTHROSCOPY WITH SUBACROMIAL DECOMPRESSION AND BICEP TENDON REPAIR Right 01/04/2019   Procedure: SHOULDER ARTHROSCOPY WITH DEBRIDEMENT AND SUBACROMIAL DECOMPRESSION-RIGHT;  Surgeon: Corky Mull, MD;  Location: [facility]  ORS;  Service: Orthopedics;  Laterality: Right;   TUBAL LIGATION      Prior to Admission medications   Medication Sig Start Date End Date Taking? Authorizing Provider  albuterol (VENTOLIN HFA) 108 (90 Base) MCG/ACT inhaler Inhale 2 puffs into the lungs every 6 (six) hours as needed for wheezing or shortness of breath. 07/27/22   [name], DO  atorvastatin (LIPITOR) 40 MG tablet Take  1 tablet (40 mg total) by mouth daily. 04/26/22   [name], DO  Continuous Blood Gluc Sensor (FREESTYLE LIBRE 3 SENSOR) MISC 1 each by Does not apply route 4 (four) times daily. Place 1 sensor on the skin every 14 days. Use to check glucose continuously 04/26/22   [name], DO  cyclobenzaprine (FLEXERIL) 5 MG tablet Take 1 tablet (5 mg total) by mouth at bedtime. 08/14/22   Poggi, Eliezer Lofts E, PA-C  docusate sodium (COLACE) 100 MG capsule Take 1 capsule (100 mg total) by mouth 2 (two) times daily as needed. 03/16/22   Rubie Maid, MD  Dulaglutide (TRULICITY) 1.5 DT/2.6ZT SOPN Inject 1.5 mg into the skin once a week. 07/27/22   [name], DO  gabapentin (NEURONTIN) 300 MG capsule Take 1 capsule (300 mg total) by mouth 3 (three) times daily. 01/14/21   Myles Gip, DO  insulin glargine (LANTUS) 100 UNIT/ML injection Inject 1 mL (100 Units total) into the skin every evening. 07/27/22   [name], DO  insulin lispro (HUMALOG) 100 UNIT/ML injection Inject 0.3 mLs (30 Units total) into the skin 3 (three) times daily before meals. 07/27/22   [name], DO  Insulin Syringe-Needle U-100 (INSULIN SYRINGE 1CC/31GX5/16") 31G X 5/16" 1 ML MISC Use 4 times daily as directed. 12/14/21   [name], DO  lisinopril (ZESTRIL) 40 MG tablet Take 1 tablet (40 mg total) by mouth at bedtime. 04/26/22   [name], DO  polyethylene glycol (MIRALAX / GLYCOLAX) 17 g packet Take 17 g by mouth daily. 04/27/22   Rubie Maid, MD  simethicone (GAS-X) 80 MG chewable tablet Chew 1 tablet (80 mg total) by mouth 4 (four) times daily as needed for flatulence. 03/16/22 03/16/23  Rubie Maid, MD  sucralfate (CARAFATE) 1 g tablet Take 1 tablet (1 g total) by mouth 4 (four) times daily for 15 days. 06/21/22 07/29/22  Harvest Dark, MD  tiZANidine (ZANAFLEX) 4 MG tablet Take 1 tablet (4 mg total) by mouth every 6 (six) hours as needed for muscle spasms. 02/22/22   [name], DO     Allergies as of 07/29/2022 - Review Complete 07/29/2022  Allergen Reaction Noted   Dilaudid [hydromorphone hcl] Nausea And Vomiting 01/19/2021   Morphine and related Nausea And Vomiting 01/04/2019   Tramadol Nausea Only 12/29/2018    Family History  Problem Relation Age of Onset   Hypertension Mother    Thyroid disease Mother    Lupus Mother    Congestive Heart Failure Mother    Hypertension Brother    CAD Maternal Grandmother    Breast cancer Neg Hx    Ovarian cancer Neg Hx    Colon cancer Neg Hx     Social History   Socioeconomic History   Marital status: Married    Spouse name: brian   Number of children: 7   Years of education: Not on file   Highest education level: Not on file  Occupational History   Occupation: disabled    Comment: disabled  Tobacco Use   Smoking status: Former    Packs/day:  2.00    Years: 25.00    Total pack years: 50.00    Types: Cigarettes    Quit date: 06/09/2017    Years since quitting: 5.2   Smokeless tobacco: Never  Vaping Use   Vaping Use: Never used  Substance and Sexual Activity   Alcohol use: No   Drug use: Yes    Frequency: 7.0 times per week    Types: Marijuana    Comment: pt states as needed when she is hurting real bad   Sexual activity: Yes    Partners: Male    Birth control/protection: None  Other Topics Concern   Not on file  Social History Narrative   Have 6 biological childrean and custody of niece since 25 months.   Social Determinants of Health   Financial Resource Strain: Low Risk  (08/07/2019)   Overall Financial Resource Strain (CARDIA)    Difficulty of Paying Living Expenses: Not hard at all  Food Insecurity: No Food Insecurity (08/07/2019)   Hunger Vital Sign    Worried About Running Out of Food in the Last Year: Never true    Ran Out of Food in the Last Year: Never true  Transportation Needs: No Transportation Needs (08/07/2019)   PRAPARE - Hydrologist (Medical): No     Lack of Transportation (Non-Medical): No  Physical Activity: Inactive (08/07/2019)   Exercise Vital Sign    Days of Exercise per Week: 0 days    Minutes of Exercise per Session: 0 min  Stress: No Stress Concern Present (08/07/2019)   [place]    Feeling of Stress : Not at all  Social Connections: [place] (08/07/2019)   Social Connection and Isolation Panel [NHANES]    Frequency of Communication with Friends and Family: More than three times a week    Frequency of Social Gatherings with Friends and Family: Never    Attends Religious Services: More than 4 times per year    Active Member of [employer] or Organizations: Yes    Attends Music therapist: More than 4 times per year    Marital Status: Married  Human resources officer Violence: Not At Risk (08/07/2019)   Humiliation, Afraid, Rape, and Kick questionnaire    Fear of Current or Ex-Partner: No    Emotionally Abused: No    Physically Abused: No    Sexually Abused: No    Review of Systems: See HPI, otherwise negative ROS  Physical Exam: Pulse 66   Temp (!) 96.7 F (35.9 C) (Temporal)   Resp 18   Ht '5\' 6"'$  (1.676 m)   Wt (!) 141.1 kg   LMP 02/22/2022 (Approximate)   SpO2 100%   BMI 50.20 kg/m  General:   Alert,  pleasant and cooperative in NAD Head:  Normocephalic and atraumatic. Neck:  Supple; no masses or thyromegaly. Lungs:  Clear throughout to auscultation.    Heart:  Regular rate and rhythm. Abdomen:  Soft, nontender and nondistended. Normal bowel sounds, without guarding, and without rebound.   Neurologic:  Alert and  oriented x4;  grossly normal neurologically.  Impression/Plan: Kaliyan SEQUOYA HOGSETT is here for an endoscopy to be performed for chronic GERD  Risks, benefits, limitations, and alternatives regarding  endoscopy have been reviewed with the patient.  Questions have been answered.  All parties agreeable.   Sherri Sear, MD   08/24/2022, 8:36 AM

## 2022-08-24 NOTE — Anesthesia Postprocedure Evaluation (Signed)
Anesthesia Post Note  Patient: Brenda Hicks  Procedure(s) Performed: ESOPHAGOGASTRODUODENOSCOPY (EGD) WITH PROPOFOL  Patient location during evaluation: Endoscopy Anesthesia Type: General Level of consciousness: awake and alert Pain management: pain level controlled Vital Signs Assessment: post-procedure vital signs reviewed and stable Respiratory status: spontaneous breathing, nonlabored ventilation, respiratory function stable and patient connected to nasal cannula oxygen Cardiovascular status: blood pressure returned to baseline and stable Postop Assessment: no apparent nausea or vomiting Anesthetic complications: no   No notable events documented.   Last Vitals:  Vitals:   08/24/22 0923 08/24/22 0928  BP: 139/89   Pulse: 72   Resp: (!) 25 10  Temp:    SpO2: 100%     Last Pain:  Vitals:   08/24/22 0905  TempSrc: Temporal  PainSc:                  [name] Antoin Dargis

## 2022-08-24 NOTE — Anesthesia Preprocedure Evaluation (Signed)
Anesthesia Evaluation  Patient identified by MRN, date of birth, ID band Patient awake    Reviewed: Allergy & Precautions, NPO status , Patient's Chart, lab work & pertinent test results  History of Anesthesia Complications Negative for: history of anesthetic complications  Airway Mallampati: III  TM Distance: >3 FB Neck ROM: full    Dental  (+) Missing   Pulmonary asthma , sleep apnea , COPD, Patient abstained from smoking., former smoker,    Pulmonary exam normal        Cardiovascular hypertension, (-) anginanegative cardio ROS Normal cardiovascular exam     Neuro/Psych  Neuromuscular disease CVA negative psych ROS   GI/Hepatic Neg liver ROS, GERD  Controlled,  Endo/Other  negative endocrine ROSdiabetes, Type 2  Renal/GU Renal disease  negative genitourinary   Musculoskeletal   Abdominal   Peds  Hematology negative hematology ROS (+)   Anesthesia Other Findings Past Medical History: No date: Anemia     Comment:  vitamin d deficiency No date: Arthritis No date: Asthma     Comment:  WELL CONTROLLED 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     Comment:  skin cancer left ear No date: COPD (chronic obstructive pulmonary disease) ([facility]) No date: Diabetes mellitus without complication ([facility]) No date: Fatty liver No date: Hypertension No date: Kidney cysts     Comment:  per patient, never had No date: Renal disorder No date: Sleep apnea     Comment:  DOES NOT USE CPAP. waiting for new machine and a new               sleep study May or June 2019: Stroke [facility])     Comment:  TIA. no residual symptoms  Past Surgical History: No date: ABDOMINAL HYSTERECTOMY 08/09/2018: ANTERIOR CERVICAL DECOMP/DISCECTOMY FUSION; N/A     Comment:  Procedure: ANTERIOR CERVICAL DECOMPRESSION/DISCECTOMY               FUSION 1 LEVEL- C4-5;  Surgeon: Meade Maw, MD;                Location: [facility] ORS;  Service: Neurosurgery;  Laterality:                N/A; No date: BACK SURGERY     Comment:  NECK 02/26/2021: COLONOSCOPY WITH PROPOFOL; N/A     Comment:  Procedure: COLONOSCOPY WITH PROPOFOL;  Surgeon: Jonathon Bellows, MD;  Location: [facility] ENDOSCOPY;  Service:               Endoscopy;  Laterality: N/A; 01/19/2021: DILATATION & CURETTAGE/HYSTEROSCOPY WITH MYOSURE; N/A     Comment:  Procedure: DILATATION & CURETTAGE/HYSTEROSCOPY;                Surgeon: Rubie Maid, MD;  Location: [facility] ORS;                Service: Gynecology;  Laterality: N/A; No date: DILATION AND CURETTAGE OF UTERUS 01/19/2021: ENDOMETRIAL ABLATION; N/A     Comment:  Procedure: ENDOMETRIAL ABLATION, MINERVA;  Surgeon:               Rubie Maid, MD;  Location: [facility] ORS;  Service:               Gynecology;  Laterality: N/A; No date: ENDOMETRIAL BIOPSY     Comment:  benign 1992: EXPLORATORY LAPAROTOMY     Comment:  REMOVAL OF RUPTURED ECTOPIC 1998: HAND SURGERY; Right  Comment:  cyst removed 1975: HERNIA REPAIR     Comment:  UMBILICAL 1497: JOINT REPLACEMENT; Right     Comment:  TKR 2012: KNEE ARTHROSCOPY; Right 2014: KNEE SURGERY; Right     Comment:  total knee replacement 03/15/2022: ROBOTIC ASSISTED LAPAROSCOPIC HYSTERECTOMY AND  SALPINGECTOMY; Bilateral     Comment:  Procedure: XI ROBOTIC ASSISTED LAPAROSCOPIC               HYSTERECTOMY;  Surgeon: Rubie Maid, MD;  Location:               [facility] ORS;  Service: Gynecology;  Laterality: Bilateral; 12/14/2016: SHOULDER ARTHROSCOPY WITH BICEPSTENOTOMY; Left     Comment:  Procedure: SHOULDER ARTHROSCOPY WITH BICEPSTENOTOMY;                Surgeon: Corky Mull, MD;  Location: [facility] ORS;  Service:              Orthopedics;  Laterality: Left; 12/14/2016: SHOULDER ARTHROSCOPY WITH OPEN ROTATOR CUFF REPAIR; Left     Comment:  Procedure: SHOULDER ARTHROSCOPY WITH OPEN ROTATOR CUFF               REPAIR AND ARTHROSCOPIC ROTATOR CUFF REPAIR;  Surgeon:               Corky Mull, MD;  Location: [facility]  ORS;  Service:               Orthopedics;  Laterality: Left; 01/04/2019: SHOULDER ARTHROSCOPY WITH ROTATOR CUFF REPAIR; Right     Comment:  Procedure: SHOULDER ARTHROSCOPY WITH ROTATOR CUFF               REPAIR;  Surgeon: Corky Mull, MD;  Location: [facility] ORS;              Service: Orthopedics;  Laterality: Right; 12/14/2016: SHOULDER ARTHROSCOPY WITH SUBACROMIAL DECOMPRESSION; Left     Comment:  Procedure: SHOULDER ARTHROSCOPY WITH SUBACROMIAL               DECOMPRESSION;  Surgeon: Corky Mull, MD;  Location:               [facility] ORS;  Service: Orthopedics;  Laterality: Left; 01/04/2019: SHOULDER ARTHROSCOPY WITH SUBACROMIAL DECOMPRESSION AND  BICEP TENDON REPAIR; Right     Comment:  Procedure: SHOULDER ARTHROSCOPY WITH DEBRIDEMENT AND               SUBACROMIAL DECOMPRESSION-RIGHT;  Surgeon: Corky Mull,              MD;  Location: [facility] ORS;  Service: Orthopedics;                Laterality: Right; No date: TUBAL LIGATION  BMI    Body Mass Index: 50.20 kg/m      Reproductive/Obstetrics negative OB ROS                             Anesthesia Physical Anesthesia Plan  ASA: 3  Anesthesia Plan: General   Post-op Pain Management:    Induction: Intravenous  PONV Risk Score and Plan: Propofol infusion and TIVA  Airway Management Planned: Natural Airway and Nasal Cannula  Additional Equipment:   Intra-op Plan:   Post-operative Plan:   Informed Consent: I have reviewed the patients History and Physical, chart, labs and discussed the procedure including the risks, benefits and alternatives for the proposed anesthesia with the patient or authorized representative who has  indicated his/her understanding and acceptance.     Dental Advisory Given  Plan Discussed with: Anesthesiologist, CRNA and Surgeon  Anesthesia Plan Comments: (Patient consented for risks of anesthesia including but not limited to:  - adverse reactions to medications - risk of  airway placement if required - damage to eyes, teeth, lips or other oral mucosa - nerve damage due to positioning  - sore throat or hoarseness - Damage to heart, brain, nerves, lungs, other parts of body or loss of life  Patient voiced understanding.)        Anesthesia Quick Evaluation

## 2022-08-24 NOTE — Op Note (Signed)
[facility] Gastroenterology Patient Name: Brenda Hicks Procedure Date: 08/24/2022 8:33 AM MRN: [id number] Account #: [id number] Date of Birth: Nov 05, 1966 Admit Type: Outpatient Age: 56 Room: [facility] ENDO ROOM 3 Gender: Female Note Status: Finalized Instrument Name: Altamese Cabal Endoscope [id number] Procedure:             Upper GI endoscopy Indications:           Dyspepsia, Follow-up of gastro-esophageal reflux                         disease Providers:             Lin Landsman MD, MD Referring MD:          [name], DO Medicines:             General Anesthesia Complications:         No immediate complications. Estimated blood loss: None. Procedure:             Pre-Anesthesia Assessment:                        - Prior to the procedure, a History and Physical was                         performed, and patient medications and allergies were                         reviewed. The patient is competent. The risks and                         benefits of the procedure and the sedation options and                         risks were discussed with the patient. All questions                         were answered and informed consent was obtained.                         Patient identification and proposed procedure were                         verified by the physician, the nurse, the                         anesthesiologist, the anesthetist and the technician                         in the pre-procedure area in the procedure room in the                         endoscopy suite. Mental Status Examination: alert and                         oriented. Airway Examination: normal oropharyngeal                         airway and neck mobility. Respiratory Examination:  clear to auscultation. CV Examination: normal.                         Prophylactic Antibiotics: The patient does not require                         prophylactic antibiotics. Prior  Anticoagulants: The                         patient has taken no previous anticoagulant or                         antiplatelet agents. ASA Grade Assessment: III - A                         patient with severe systemic disease. After reviewing                         the risks and benefits, the patient was deemed in                         satisfactory condition to undergo the procedure. The                         anesthesia plan was to use general anesthesia.                         Immediately prior to administration of medications,                         the patient was re-assessed for adequacy to receive                         sedatives. The heart rate, respiratory rate, oxygen                         saturations, blood pressure, adequacy of pulmonary                         ventilation, and response to care were monitored                         throughout the procedure. The physical status of the                         patient was re-assessed after the procedure.                        After obtaining informed consent, the endoscope was                         passed under direct vision. Throughout the procedure,                         the patient's blood pressure, pulse, and oxygen                         saturations were monitored continuously. The Endoscope  was introduced through the mouth, and advanced to the                         second part of duodenum. The upper GI endoscopy was                         accomplished without difficulty. The patient tolerated                         the procedure well. Findings:      The duodenal bulb and second portion of the duodenum were normal.      Multiple dispersed long linear erosions with no bleeding and no stigmata       of recent bleeding were found in the gastric body and in the gastric       antrum. Biopsies were taken with a cold forceps for Helicobacter pylori       testing.      The cardia and  gastric fundus were normal on retroflexion.      The gastroesophageal junction and examined esophagus were normal. Impression:            - Normal duodenal bulb and second portion of the                         duodenum.                        - Erosive gastropathy with no bleeding and no stigmata                         of recent bleeding. Biopsied.                        - Normal gastroesophageal junction and esophagus. Recommendation:        - Discharge patient to home (with escort).                        - Resume previous diet today.                        - Continue present medications.                        - Await pathology results.                        - Follow an antireflux regimen indefinitely. Procedure Code(s):     --- Professional ---                        [phone], Esophagogastroduodenoscopy, flexible,                         transoral; with biopsy, single or multiple Diagnosis Code(s):     --- Professional ---                        K31.89, Other diseases of stomach and duodenum                        R10.13, Epigastric pain  K21.9, Gastro-esophageal reflux disease without                         esophagitis CPT copyright 2019 American Medical Association. All rights reserved. The codes documented in this report are preliminary and upon coder review may  be revised to meet current compliance requirements. Dr. Ulyess Mort Lin Landsman MD, MD 08/24/2022 8:59:20 AM This report has been signed electronically. Number of Addenda: 0 Note Initiated On: 08/24/2022 8:33 AM Estimated Blood Loss:  Estimated blood loss: none.      [facility]

## 2022-08-25 ENCOUNTER — Encounter: Payer: Self-pay | Admitting: Gastroenterology

## 2022-08-25 LAB — SURGICAL PATHOLOGY

## 2022-09-20 ENCOUNTER — Encounter (INDEPENDENT_AMBULATORY_CARE_PROVIDER_SITE_OTHER): Payer: Self-pay

## 2022-09-20 ENCOUNTER — Encounter: Payer: Self-pay | Admitting: Internal Medicine

## 2022-09-21 ENCOUNTER — Ambulatory Visit
Admission: RE | Admit: 2022-09-21 | Discharge: 2022-09-21 | Disposition: A | Discharge status: Home / Self Care | Payer: Medicaid Other | Source: Ambulatory Visit | Attending: Internal Medicine | Admitting: Internal Medicine

## 2022-09-21 ENCOUNTER — Ambulatory Visit (INDEPENDENT_AMBULATORY_CARE_PROVIDER_SITE_OTHER): Payer: Medicaid Other | Admitting: Internal Medicine

## 2022-09-21 ENCOUNTER — Encounter: Payer: Self-pay | Admitting: Internal Medicine

## 2022-09-21 ENCOUNTER — Ambulatory Visit
Admission: RE | Admit: 2022-09-21 | Discharge: 2022-09-21 | Disposition: A | Discharge status: Home / Self Care | Payer: Medicaid Other | Attending: Internal Medicine | Admitting: Internal Medicine

## 2022-09-21 VITALS — BP 152/82 | HR 102 | Temp 98.3°F | Resp 16 | Ht 66.0 in | Wt 319.1 lb

## 2022-09-21 DIAGNOSIS — M79641 Pain in right hand: Secondary | ICD-10-CM

## 2022-09-21 DIAGNOSIS — Z52008 Unspecified donor, other blood: Secondary | ICD-10-CM | POA: Diagnosis not present

## 2022-09-21 DIAGNOSIS — Z1231 Encounter for screening mammogram for malignant neoplasm of breast: Secondary | ICD-10-CM | POA: Diagnosis not present

## 2022-09-21 MED ORDER — NAPROXEN 500 MG PO TABS: 500.0000 mg | ORAL_TABLET | Freq: Two times a day (BID) | ORAL | 0 refills | Status: AC

## 2022-09-21 NOTE — Progress Notes (Signed)
Acute Office Visit  Subjective:     Patient ID: Brenda Hicks, female    DOB: [birth year], 56 y.o.   MRN: [id number]  Chief Complaint  Patient presents with   Hand Pain    W/ swelling    HPI Patient is in today for right hand pain.  She states 4 days ago she was braiding her grandson's hair when she started having sharp mid right hand pain on the dorsal side.  It radiated into her forearm and elbow and was swollen at that time.  She took some Aleve and the swelling resolved, however it is still slightly swollen this morning.  Her pain is mainly in the middle of her hand over her second and third tendons but will radiate into the base of her thumb as well.  She is right-handed.  She does have a history of a ganglion cyst removal in the same area in the 90s.  She does have occasional numbness and tingling into all 5 digits.   Review of Systems  Constitutional:  Negative for chills and fever.  Musculoskeletal:  Positive for joint pain.  Skin: Negative.   Neurological:  Positive for tingling. Negative for weakness.      Objective:    BP (!) 152/82   Pulse (!) 102   Temp 98.3 F (36.8 C)   Resp 16   Ht '5\' 6"'$  (1.676 m)   Wt (!) 319 lb 1.6 oz (144.7 kg)   LMP 02/22/2022 (Approximate)   SpO2 98%   BMI 51.50 kg/m  BP Readings from Last 3 Encounters:  09/21/22 (!) 152/82  08/24/22 139/89  08/14/22 (!) 170/100   Wt Readings from Last 3 Encounters:  09/21/22 (!) 319 lb 1.6 oz (144.7 kg)  08/24/22 (!) 311 lb (141.1 kg)  08/14/22 (!) 311 lb (141.1 kg)      Physical Exam Constitutional:      Appearance: Normal appearance.  HENT:     Head: Normocephalic and atraumatic.  Eyes:     Conjunctiva/sclera: Conjunctivae normal.  Cardiovascular:     Rate and Rhythm: Normal rate and regular rhythm.  Pulmonary:     Effort: Pulmonary effort is normal.     Breath sounds: Normal breath sounds.  Musculoskeletal:     Right hand: Swelling and bony tenderness present. Normal range of  motion. Normal strength. Normal sensation. Normal capillary refill.     Comments: Very mild swelling with tenderness over the second and third MCP into the mid hand.  Good range of motion.  Skin:    General: Skin is warm and dry.  Neurological:     General: No focal deficit present.     Mental Status: She is alert. Mental status is at baseline.  Psychiatric:        Mood and Affect: Mood normal.        Behavior: Behavior normal.     No results found for any visits on 09/21/22.      Assessment & Plan:   1. Pain of right hand: Patient concern for ganglion cyst, she had to have one surgically removed 25 years ago.  I do not see any obvious ganglion cyst, however she is having some mid pain swelling and bony tenderness.  Physical exam consistent with arthritis versus tendinitis.  We will obtain an x-ray of the right hand for further evaluation.  She will be treated with scheduled anti-inflammatories, naproxen 500 mg twice daily for 10 days to help with pain.  Also discussed trying  to use left hand for most tasks, rest and topical anti-inflammatory such as Voltaren or [place].  - DG Hand Complete Right; Future - naproxen (NAPROSYN) 500 MG tablet; Take 1 tablet (500 mg total) by mouth 2 (two) times daily with a meal for 10 days.  Dispense: 20 tablet; Refill: 0  2. Encounter for screening mammogram for malignant neoplasm of breast: Mammogram ordered. - MM 3D SCREEN BREAST BILATERAL; Future  3. Plasma donor: Patient wanting to be a plasma donor, however she needed a note from her doctor stating that this was okay due to her being on insulin.  Discussed how she can donate plasma as long as she stays well-hydrated, is checking her sugars frequently and not donating on an empty stomach.  She was given a note for donation once, if tolerated she can donate once every 6 weeks.  Hemoglobin from last set of labs in July.   Return for already scheduled .  [name], DO

## 2022-09-22 ENCOUNTER — Encounter: Payer: Self-pay | Admitting: Internal Medicine

## 2022-09-23 NOTE — Telephone Encounter (Unsigned)
Copied from [place] [id number]. Topic: General - Other >> Sep 23, 2022  1:55 PM Leitha Schuller wrote: Pt checking status of 10-31 xray results  Please fu w/ pt

## 2022-09-23 NOTE — Telephone Encounter (Unsigned)
Copied from [place] [id number]. Topic: General - Other >> Sep 23, 2022  1:55 PM Leitha Schuller wrote: Pt checking status of 10-31 xray results  Please fu w/ pt

## 2022-10-17 ENCOUNTER — Other Ambulatory Visit: Payer: Self-pay

## 2022-10-17 ENCOUNTER — Emergency Department
Admission: EM | Admit: 2022-10-17 | Discharge: 2022-10-17 | Disposition: A | Discharge status: Home / Self Care | Payer: Medicaid Other | Attending: Emergency Medicine | Admitting: Emergency Medicine

## 2022-10-17 DIAGNOSIS — J45909 Unspecified asthma, uncomplicated: Secondary | ICD-10-CM | POA: Insufficient documentation

## 2022-10-17 DIAGNOSIS — J449 Chronic obstructive pulmonary disease, unspecified: Secondary | ICD-10-CM | POA: Diagnosis not present

## 2022-10-17 DIAGNOSIS — M5432 Sciatica, left side: Secondary | ICD-10-CM | POA: Insufficient documentation

## 2022-10-17 DIAGNOSIS — I10 Essential (primary) hypertension: Secondary | ICD-10-CM | POA: Diagnosis not present

## 2022-10-17 DIAGNOSIS — E119 Type 2 diabetes mellitus without complications: Secondary | ICD-10-CM | POA: Diagnosis not present

## 2022-10-17 DIAGNOSIS — M549 Dorsalgia, unspecified: Secondary | ICD-10-CM | POA: Diagnosis present

## 2022-10-17 MED ORDER — KETOROLAC TROMETHAMINE 15 MG/ML IJ SOLN
15.0000 mg | Freq: Once | INTRAMUSCULAR | Status: AC
  Administered 2022-10-17: 15 mg via INTRAMUSCULAR
  Filled 2022-10-17: qty 1

## 2022-10-17 MED ORDER — LIDOCAINE 5 % EX PTCH: 1.0000 | MEDICATED_PATCH | Freq: Two times a day (BID) | CUTANEOUS | 11 refills | Status: DC

## 2022-10-17 MED ORDER — METHOCARBAMOL 500 MG PO TABS: 500.0000 mg | ORAL_TABLET | Freq: Four times a day (QID) | ORAL | 0 refills | Status: AC

## 2022-10-17 MED ORDER — DEXAMETHASONE SODIUM PHOSPHATE 10 MG/ML IJ SOLN
10.0000 mg | Freq: Once | INTRAMUSCULAR | Status: AC
  Administered 2022-10-17: 10 mg via INTRAMUSCULAR
  Filled 2022-10-17: qty 1
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
## 2022-10-17 NOTE — Discharge Instructions (Addendum)
-  Continue taking your naproxen and gabapentin as prescribed.  You may additionally utilize lidocaine patches and methocarbamol.  Do not take any other muscle relaxers, such as cyclobenzaprine or tizanidine while you are taking methocarbamol.  -Highly recommend reviewing the sciatica rehab exercises to help improve your symptoms.  -Please follow-up with your regular doctor as needed.  -Return to the emergency department anytime if you begin to experience any new or worsening symptoms.

## 2022-10-25 NOTE — Progress Notes (Unsigned)
Established Patient Office Visit  Subjective:  Patient ID: Brenda Hicks, female    DOB: [birth year]  Age: 56 y.o. MRN: [id number]  CC:  No chief complaint on file.   HPI Brenda Hicks presents for follow up on chronic medical conditions.   Hypertension, OSA: -Medications: Currently on Lisinopril 40 mg  -Patient is compliant with medications and reports no side effects.  -Checking BP at home: no -Denies any SOB, CP, LE edema, medication SEs, or symptoms of hypotension -Was previously diagnosed with OSA but has been without a CPAP in some time - Sleep study early December showing mild OSA with plans to return to the sleep center for PAP titration    Diabetes, Type 2 -Last A1c 7.8% 9/23 -Medications: Lantus 100u daily (takes at bedtime), Humalog 93T TID, Trulicity 1.5 mg on Sundays -Checking BG at home: fasting - 127-160. Had one low sugar but wasn't eating as much  -Diet: working on eating less carbs, lost about 7 pounds since LOV -Exercise: increased walking, trying to stay active with grandchildren, starting weight lifting  -Eye exam: Dec 22, 2021 -Foot exam: Due  -Microalbumin: on ACEI, due  -Statin: Lipitor 40 mg -PNA vaccine: UTD   HLD: -Medications: Lipitor 40 mg -Tolerating medication well, no side effects, compliant.  -Last lipid panel Lipid Panel     Component Value Date/Time   CHOL 185 12/14/2021 1019   TRIG 109 12/14/2021 1019   HDL 41 (L) 12/14/2021 1019   CHOLHDL 4.5 12/14/2021 1019   VLDL 20 03/31/2018 0539   LDLCALC 122 (H) 12/14/2021 1019   The 10-year ASCVD risk score (Arnett DK, et al., 2019) is: 33.1%   Values used to calculate the score:     Age: 30 years     Sex: Female     Is Non-Hispanic African American: Yes     Diabetic: Yes     Tobacco smoker: No     Systolic Blood Pressure: 701 mmHg     Is BP treated: Yes     HDL Cholesterol: 41 mg/dL     Total Cholesterol: 185 mg/dL  Asthma:  -Asthma status: stable -Current Treatments:  albuterol PRN, needs refill -Satisfied with current treatment?: yes -Current upper respiratory symptoms: no -Triggers: pollen -Visits to ER or Urgent Care in past year: no -Pneumovax: Up to Date -Influenza: Up to Date  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 Seen by Neurosurg 5/20 for R arm pain, cervical and shoulder MRI unrevealing. Neurosurg recommending considering injections or therapy. Also following with Orthopedics for left knee pain, note reviewed 03/05/22. She is on Celebrex for pain and was given a steroid injection in the knee.   Past Medical History:  Diagnosis Date   Anemia    vitamin d deficiency   Arthritis    Asthma    WELL CONTROLLED   Cancer of ear    skin cancer left ear   COPD (chronic obstructive pulmonary disease) ([place])    Diabetes mellitus without complication ([place])    Fatty liver    Hypertension    Kidney cysts    per patient, never had   Renal disorder    Sleep apnea    DOES NOT USE CPAP. waiting for new machine and a new sleep study   Stroke [facility]) May or June 2019   TIA. no residual symptoms    Past Surgical History:  Procedure Laterality Date   ABDOMINAL HYSTERECTOMY     ANTERIOR CERVICAL DECOMP/DISCECTOMY  FUSION N/A 08/09/2018   Procedure: ANTERIOR CERVICAL DECOMPRESSION/DISCECTOMY FUSION 1 LEVEL- C4-5;  Surgeon: Meade Maw, MD;  Location: [facility] ORS;  Service: Neurosurgery;  Laterality: N/A;   BACK SURGERY     NECK   COLONOSCOPY WITH PROPOFOL N/A 02/26/2021   Procedure: COLONOSCOPY WITH PROPOFOL;  Surgeon: Jonathon Bellows, MD;  Location: [facility] ENDOSCOPY;  Service: Endoscopy;  Laterality: N/A;   DILATATION & CURETTAGE/HYSTEROSCOPY WITH MYOSURE N/A 01/19/2021   Procedure: DILATATION & CURETTAGE/HYSTEROSCOPY;  Surgeon: Rubie Maid, MD;  Location: [facility] ORS;  Service: Gynecology;  Laterality: N/A;   DILATION AND CURETTAGE OF UTERUS     ENDOMETRIAL ABLATION N/A 01/19/2021   Procedure: ENDOMETRIAL ABLATION, MINERVA;   Surgeon: Rubie Maid, MD;  Location: [facility] ORS;  Service: Gynecology;  Laterality: N/A;   ENDOMETRIAL BIOPSY     benign   ESOPHAGOGASTRODUODENOSCOPY (EGD) WITH PROPOFOL N/A 08/24/2022   Procedure: ESOPHAGOGASTRODUODENOSCOPY (EGD) WITH PROPOFOL;  Surgeon: Lin Landsman, MD;  Location: [facility];  Service: Gastroenterology;  Laterality: N/A;   EXPLORATORY LAPAROTOMY  1992   REMOVAL OF RUPTURED ECTOPIC   HAND SURGERY Right 1998   cyst removed   HERNIA REPAIR  1749   UMBILICAL   JOINT REPLACEMENT Right 2014   TKR   KNEE ARTHROSCOPY Right 2012   KNEE SURGERY Right 2014   total knee replacement   ROBOTIC ASSISTED LAPAROSCOPIC HYSTERECTOMY AND SALPINGECTOMY Bilateral 03/15/2022   Procedure: XI ROBOTIC ASSISTED LAPAROSCOPIC HYSTERECTOMY;  Surgeon: Rubie Maid, MD;  Location: [facility] OR[facility];  Service: Gynecology;  Laterality: Bilateral;   SHOULDER ARTHROSCOPY WITH BICEPSTENOTOMY Left 12/14/2016   Procedure: SHOULDER ARTHROSCOPY WITH BICEPSTENOTOMY;  Surgeon: Corky Mull, MD;  Location: [facility];  Service: Orthopedics;  Laterality: Left;   SHOULDER ARTHROSCOPY WITH OPEN ROTATOR CUFF REPAIR Left 12/14/2016   Procedure: SHOULDER ARTHROSCOPY WITH OPEN ROTATOR CUFF REPAIR AND ARTHROSCOPIC ROTATOR CUFF REPAIR;  Surgeon: Corky Mull, MD;  Location: [facility];  Service: Orthopedics;  Laterality: Left;   SHOULDER ARTHROSCOPY WITH ROTATOR CUFF REPAIR Right 01/04/2019   Procedure: SHOULDER ARTHROSCOPY WITH ROTATOR CUFF REPAIR;  Surgeon: Corky Mull, MD;  Location: [facility];  Service: Orthopedics;  Laterality: Right;   SHOULDER ARTHROSCOPY WITH SUBACROMIAL DECOMPRESSION Left 12/14/2016   Procedure: SHOULDER ARTHROSCOPY WITH SUBACROMIAL DECOMPRESSION;  Surgeon: Corky Mull, MD;  Location: [facility];  Service: Orthopedics;  Laterality: Left;   SHOULDER ARTHROSCOPY WITH SUBACROMIAL DECOMPRESSION AND BICEP TENDON REPAIR Right 01/04/2019   Procedure: SHOULDER ARTHROSCOPY WITH DEBRIDEMENT AND SUBACROMIAL  DECOMPRESSION-RIGHT;  Surgeon: Corky Mull, MD;  Location: [facility];  Service: Orthopedics;  Laterality: Right;   TUBAL LIGATION      Family History  Problem Relation Age of Onset   Hypertension Mother    Thyroid disease Mother    Lupus Mother    Congestive Heart Failure Mother    Hypertension Brother    CAD Maternal Grandmother    Breast cancer Neg Hx    Ovarian cancer Neg Hx    Colon cancer Neg Hx     Social History   Socioeconomic History   Marital status: Married    Spouse name: brian   Number of children: 7   Years of education: Not on file   Highest education level: Not on file  Occupational History   Occupation: disabled    Comment: disabled  Tobacco Use   Smoking status: Former    Packs/day: 2.00    Years: 25.00    Total pack years: 50.00    Types: Cigarettes  Quit date: 06/09/2017    Years since quitting: 5.3   Smokeless tobacco: Never  Vaping Use   Vaping Use: Never used  Substance and Sexual Activity   Alcohol use: No   Drug use: Yes    Frequency: 7.0 times per week    Types: Marijuana    Comment: pt states as needed when she is hurting real bad   Sexual activity: Yes    Partners: Male    Birth control/protection: None  Other Topics Concern   Not on file  Social History Narrative   Have 6 biological childrean and custody of niece since 39 months.   Social Determinants of Health   Financial Resource Strain: Low Risk  (08/07/2019)   Overall Financial Resource Strain (CARDIA)    Difficulty of Paying Living Expenses: Not hard at all  Food Insecurity: No Food Insecurity (08/07/2019)   Hunger Vital Sign    Worried About Running Out of Food in the Last Year: Never true    Ran Out of Food in the Last Year: Never true  Transportation Needs: No Transportation Needs (08/07/2019)   PRAPARE - Hydrologist (Medical): No    Lack of Transportation (Non-Medical): No  Physical Activity: Inactive (08/07/2019)   Exercise Vital Sign     Days of Exercise per Week: 0 days    Minutes of Exercise per Session: 0 min  Stress: No Stress Concern Present (08/07/2019)   [place] of Stress : Not at all  Social Connections: [place] (08/07/2019)   Social Connection and Isolation Panel [NHANES]    Frequency of Communication with Friends and Family: More than three times a week    Frequency of Social Gatherings with Friends and Family: Never    Attends Religious Services: More than 4 times per year    Active Member of [employer] or Organizations: Yes    Attends Music therapist: More than 4 times per year    Marital Status: Married  Human resources officer Violence: Not At Risk (08/07/2019)   Humiliation, Afraid, Rape, and Kick questionnaire    Fear of Current or Ex-Partner: No    Emotionally Abused: No    Physically Abused: No    Sexually Abused: No    Outpatient Medications Prior to Visit  Medication Sig Dispense Refill   albuterol (VENTOLIN HFA) 108 (90 Base) MCG/ACT inhaler Inhale 2 puffs into the lungs every 6 (six) hours as needed for wheezing or shortness of breath. 18 g 3   atorvastatin (LIPITOR) 40 MG tablet Take 1 tablet (40 mg total) by mouth daily. 90 tablet 3   Continuous Blood Gluc Sensor (FREESTYLE LIBRE 3 SENSOR) MISC 1 each by Does not apply route 4 (four) times daily. Place 1 sensor on the skin every 14 days. Use to check glucose continuously 2 each 3   docusate sodium (COLACE) 100 MG capsule Take 1 capsule (100 mg total) by mouth 2 (two) times daily as needed. 30 capsule 2   Dulaglutide (TRULICITY) 1.5 VC/9.4WH SOPN Inject 1.5 mg into the skin once a week. 2 mL 2   gabapentin (NEURONTIN) 300 MG capsule Take 1 capsule (300 mg total) by mouth 3 (three) times daily. 90 capsule 1   insulin glargine (LANTUS) 100 UNIT/ML injection Inject 1 mL (100 Units total) into the skin every evening. 10 mL 9   insulin lispro (HUMALOG) 100  UNIT/ML injection Inject 0.3 mLs (30 Units  total) into the skin 3 (three) times daily before meals. 10 mL 11   Insulin Syringe-Needle U-100 (INSULIN SYRINGE 1CC/31GX5/16") 31G X 5/16" 1 ML MISC Use 4 times daily as directed. 100 each 3   lidocaine (LIDODERM) 5 % Place 1 patch onto the skin every 12 (twelve) hours. Remove & Discard patch within 12 hours or as directed by MD 60 patch 11   lisinopril (ZESTRIL) 40 MG tablet Take 1 tablet (40 mg total) by mouth at bedtime. 90 tablet 1   methocarbamol (ROBAXIN) 500 MG tablet Take 1 tablet (500 mg total) by mouth 4 (four) times daily for 10 days. 40 tablet 0   omeprazole (PRILOSEC) 40 MG capsule Take 1 capsule (40 mg total) by mouth daily before breakfast. 30 capsule 1   polyethylene glycol (MIRALAX / GLYCOLAX) 17 g packet Take 17 g by mouth daily. 14 each 0   simethicone (GAS-X) 80 MG chewable tablet Chew 1 tablet (80 mg total) by mouth 4 (four) times daily as needed for flatulence. 100 tablet 2   tiZANidine (ZANAFLEX) 4 MG tablet Take 1 tablet (4 mg total) by mouth every 6 (six) hours as needed for muscle spasms. 30 tablet 0   No facility-administered medications prior to visit.    Allergies  Allergen Reactions   Dilaudid [Hydromorphone Hcl] Nausea And Vomiting   Morphine And Related Nausea And Vomiting   Tramadol Nausea Only    If she takes antinausea medicine with this medicine, then she can tolerate it.    ROS Review of Systems  Constitutional:  Negative for chills and fever.  Eyes:  Negative for visual disturbance.  Respiratory:  Negative for cough and shortness of breath.   Cardiovascular:  Negative for chest pain.  Gastrointestinal:  Positive for abdominal pain and nausea. Negative for blood in stool, constipation and diarrhea.  Neurological:  Negative for dizziness and headaches.      Objective:    Physical Exam Constitutional:      Appearance: Normal appearance. She is obese.  HENT:     Head: Normocephalic and atraumatic.   Eyes:     Conjunctiva/sclera: Conjunctivae normal.  Cardiovascular:     Rate and Rhythm: Normal rate and regular rhythm.     Pulses:          Dorsalis pedis pulses are 2+ on the right side and 2+ on the left side.  Pulmonary:     Effort: Pulmonary effort is normal.     Breath sounds: Normal breath sounds.  Musculoskeletal:     Right lower leg: No edema.     Left lower leg: No edema.     Right foot: Normal range of motion. No deformity, bunion, Charcot foot, foot drop or prominent metatarsal heads.     Left foot: Normal range of motion. No deformity, bunion, Charcot foot, foot drop or prominent metatarsal heads.  Feet:     Right foot:     Protective Sensation: 6 sites tested.  6 sites sensed.     Skin integrity: Skin integrity normal.     Toenail Condition: Right toenails are long.     Left foot:     Protective Sensation: 6 sites tested.  6 sites sensed.     Skin integrity: Skin integrity normal.     Toenail Condition: Left toenails are long.  Skin:    General: Skin is warm and dry.  Neurological:     General: No focal deficit present.     Mental Status: She is alert.  Mental status is at baseline.  Psychiatric:        Mood and Affect: Mood normal.        Behavior: Behavior normal.     LMP 02/22/2022 (Approximate)  Wt Readings from Last 3 Encounters:  10/17/22 (!) 314 lb (142.4 kg)  09/21/22 (!) 319 lb 1.6 oz (144.7 kg)  08/24/22 (!) 311 lb (141.1 kg)     Health Maintenance Due  Topic Date Due   MAMMOGRAM  Never done   Diabetic kidney evaluation - Urine ACR  08/06/2020   INFLUENZA VACCINE  06/22/2022   COVID-19 Vaccine (6 - 2023-24 season) 07/23/2022    There are no preventive care reminders to display for this patient.  Lab Results  Component Value Date   TSH 1.614 03/15/2022   Lab Results  Component Value Date   WBC 5.5 06/21/2022   HGB 13.7 06/21/2022   HCT 44.0 06/21/2022   MCV 94.0 06/21/2022   PLT 246 06/21/2022   Lab Results  Component Value  Date   NA 138 08/14/2022   K 4.5 08/14/2022   CO2 29 08/14/2022   GLUCOSE 182 (H) 08/14/2022   BUN 12 08/14/2022   CREATININE 1.00 08/14/2022   BILITOT 0.5 06/21/2022   ALKPHOS 73 06/21/2022   AST 21 06/21/2022   ALT 20 06/21/2022   PROT 7.6 06/21/2022   ALBUMIN 3.8 06/21/2022   CALCIUM 9.0 08/14/2022   ANIONGAP 8 08/14/2022   EGFR 79 01/18/2022   Lab Results  Component Value Date   CHOL 185 12/14/2021   Lab Results  Component Value Date   HDL 41 (L) 12/14/2021   Lab Results  Component Value Date   LDLCALC 122 (H) 12/14/2021   Lab Results  Component Value Date   TRIG 109 12/14/2021   Lab Results  Component Value Date   CHOLHDL 4.5 12/14/2021   Lab Results  Component Value Date   HGBA1C 7.8 (A) 07/27/2022      Assessment & Plan:   1. Type 2 diabetes mellitus with hyperglycemia, with long-term current use of insulin ([place]): A1c 7.8 today.  We will increase her Trulicity to 1.5 mg weekly.  Continue Lantus 100 units at night, Humalog 30 units with meals, insulins refilled today.  Follow-up in 3 months to recheck A1c.  Plan to continue to increase Trulicity and start decreasing insulin to help the patient with weight loss.  Foot exam today.  - POCT HgB A1C - Dulaglutide (TRULICITY) 1.5 GB/2.0FE SOPN; Inject 1.5 mg into the skin once a week.  Dispense: 2 mL; Refill: 2 - insulin glargine (LANTUS) 100 UNIT/ML injection; Inject 1 mL (100 Units total) into the skin every evening.  Dispense: 10 mL; Refill: 9 - insulin lispro (HUMALOG) 100 UNIT/ML injection; Inject 0.3 mLs (30 Units total) into the skin 3 (three) times daily before meals.  Dispense: 10 mL; Refill: 11 - [facility] Diabetes Foot Exam  2. Essential hypertension: Chronic and stable.  Blood pressure at goal today.  Continue lisinopril 40 mg daily.  3. Hyperlipidemia associated with type 2 diabetes mellitus ([place]): Chronic and stable.  Continue Lipitor 40 mg daily.  4. Uncomplicated asthma, unspecified asthma severity,  unspecified whether persistent: Stable.  Refill albuterol inhaler to use as needed.  - albuterol (VENTOLIN HFA) 108 (90 Base) MCG/ACT inhaler; Inhale 2 puffs into the lungs every 6 (six) hours as needed for wheezing or shortness of breath.  Dispense: 18 g; Refill: 3  5. Gastroesophageal reflux disease, unspecified whether esophagitis present/Epigastric pain:  Continues to complain of epigastric pain and acid reflux symptoms.  Increase Protonix to 40 mg twice daily, continue Carafate 1 g daily.  Referral to GI placed for potential EGD, as the patient has history of ulcers.  - Ambulatory referral to Gastroenterology - pantoprazole (PROTONIX) 40 MG tablet; Take 1 tablet (40 mg total) by mouth 2 (two) times daily.  Dispense: 180 tablet; Refill: 1   Follow-up: No follow-ups on file.    [name], DO

## 2022-10-26 ENCOUNTER — Encounter: Payer: Self-pay | Admitting: Internal Medicine

## 2022-10-26 ENCOUNTER — Ambulatory Visit (INDEPENDENT_AMBULATORY_CARE_PROVIDER_SITE_OTHER): Payer: Medicaid Other | Admitting: Internal Medicine

## 2022-10-26 VITALS — BP 132/84 | HR 110 | Temp 97.0°F | Resp 18 | Ht 66.0 in | Wt 322.7 lb

## 2022-10-26 DIAGNOSIS — Z794 Long term (current) use of insulin: Secondary | ICD-10-CM | POA: Diagnosis not present

## 2022-10-26 DIAGNOSIS — M4722 Other spondylosis with radiculopathy, cervical region: Secondary | ICD-10-CM

## 2022-10-26 DIAGNOSIS — E1165 Type 2 diabetes mellitus with hyperglycemia: Secondary | ICD-10-CM

## 2022-10-26 DIAGNOSIS — F329 Major depressive disorder, single episode, unspecified: Secondary | ICD-10-CM

## 2022-10-26 LAB — POCT GLYCOSYLATED HEMOGLOBIN (HGB A1C): Hemoglobin A1C: 9.3 % — AB (ref 4.0–5.6)

## 2022-10-26 MED ORDER — DULOXETINE HCL 30 MG PO CPEP: 30.0000 mg | ORAL_CAPSULE | Freq: Every day | ORAL | 1 refills | Status: DC

## 2022-10-26 MED ORDER — TRULICITY 1.5 MG/0.5ML ~~LOC~~ SOAJ: 1.5000 mg | SUBCUTANEOUS | 2 refills | Status: DC

## 2022-10-26 NOTE — Patient Instructions (Addendum)
It was great seeing you today!  Plan discussed at today's visit: -Bring me your MRI results -Start Cymbalta 30 mg daily for moods and pain -New prescription to Trulicity sent to pharmacy please call me and let me know what they need from [place] to get this medication back   Follow up in: 6 weeks   Take care and let us know if you have any questions or concerns prior to your next visit.  Dr. Rosana Berger

## 2022-10-27 LAB — MICROALBUMIN / CREATININE URINE RATIO
Creatinine, Urine: 36 mg/dL (ref 20–275)
Microalb Creat Ratio: 6 mcg/mg creat (ref <30)
Microalb, Ur: 0.2 mg/dL

## 2022-11-06 ENCOUNTER — Emergency Department: Payer: Medicaid Other

## 2022-11-06 ENCOUNTER — Other Ambulatory Visit: Payer: Self-pay

## 2022-11-06 ENCOUNTER — Emergency Department
Admission: EM | Admit: 2022-11-06 | Discharge: 2022-11-06 | Disposition: A | Discharge status: Home / Self Care | Payer: Medicaid Other | Attending: Emergency Medicine | Admitting: Emergency Medicine

## 2022-11-06 DIAGNOSIS — J4521 Mild intermittent asthma with (acute) exacerbation: Secondary | ICD-10-CM | POA: Diagnosis not present

## 2022-11-06 DIAGNOSIS — Z1152 Encounter for screening for COVID-19: Secondary | ICD-10-CM | POA: Insufficient documentation

## 2022-11-06 DIAGNOSIS — Z8673 Personal history of transient ischemic attack (TIA), and cerebral infarction without residual deficits: Secondary | ICD-10-CM | POA: Diagnosis not present

## 2022-11-06 DIAGNOSIS — E1142 Type 2 diabetes mellitus with diabetic polyneuropathy: Secondary | ICD-10-CM | POA: Insufficient documentation

## 2022-11-06 DIAGNOSIS — R0602 Shortness of breath: Secondary | ICD-10-CM | POA: Diagnosis present

## 2022-11-06 LAB — RESP PANEL BY RT-PCR (RSV, FLU A&B, COVID)  RVPGX2
Influenza A by PCR: NEGATIVE
Influenza B by PCR: NEGATIVE
Resp Syncytial Virus by PCR: NEGATIVE
SARS Coronavirus 2 by RT PCR: NEGATIVE

## 2022-11-06 LAB — CBC
HCT: 44.3 % (ref 36.0–46.0)
Hemoglobin: 14 g/dL (ref 12.0–15.0)
MCH: 29.7 pg (ref 26.0–34.0)
MCHC: 31.6 g/dL (ref 30.0–36.0)
MCV: 93.9 fL (ref 80.0–100.0)
Platelets: 244 10*3/uL (ref 150–400)
RBC: 4.72 MIL/uL (ref 3.87–5.11)
RDW: 13.3 % (ref 11.5–15.5)
WBC: 6.2 10*3/uL (ref 4.0–10.5)
nRBC: 0 % (ref 0.0–0.2)

## 2022-11-06 LAB — BASIC METABOLIC PANEL
Anion gap: 11 (ref 5–15)
BUN: 13 mg/dL (ref 6–20)
CO2: 28 mmol/L (ref 22–32)
Calcium: 9.2 mg/dL (ref 8.9–10.3)
Chloride: 99 mmol/L (ref 98–111)
Creatinine, Ser: 1.25 mg/dL — ABNORMAL HIGH (ref 0.44–1.00)
GFR, Estimated: 51 mL/min — ABNORMAL LOW (ref >60)
Glucose, Bld: 261 mg/dL — ABNORMAL HIGH (ref 70–99)
Potassium: 4.4 mmol/L (ref 3.5–5.1)
Sodium: 138 mmol/L (ref 135–145)

## 2022-11-06 LAB — TROPONIN I (HIGH SENSITIVITY)
Troponin I (High Sensitivity): 4 ng/L (ref <18)
Troponin I (High Sensitivity): 3 ng/L (ref <18)

## 2022-11-06 MED ORDER — IPRATROPIUM-ALBUTEROL 0.5-2.5 (3) MG/3ML IN SOLN
3.0000 mL | Freq: Once | RESPIRATORY_TRACT | Status: AC
  Administered 2022-11-06: 3 mL via RESPIRATORY_TRACT
  Filled 2022-11-06: qty 3

## 2022-11-06 MED ORDER — ALBUTEROL SULFATE HFA 108 (90 BASE) MCG/ACT IN AERS: 2.0000 | INHALATION_SPRAY | Freq: Four times a day (QID) | RESPIRATORY_TRACT | 2 refills | Status: DC | PRN

## 2022-11-06 MED ORDER — METHYLPREDNISOLONE SODIUM SUCC 125 MG IJ SOLR: 125.0000 mg | Freq: Once | INTRAMUSCULAR | Status: DC

## 2022-11-06 MED ORDER — PREDNISONE 20 MG PO TABS
60.0000 mg | ORAL_TABLET | Freq: Once | ORAL | Status: AC
  Administered 2022-11-06: 60 mg via ORAL
  Filled 2022-11-06: qty 3

## 2022-11-06 MED ORDER — PREDNISONE 50 MG PO TABS: ORAL_TABLET | ORAL | 0 refills | Status: DC

## 2022-11-06 NOTE — ED Provider Triage Note (Signed)
Emergency Medicine Provider Triage Evaluation Note  Brenda Hicks , a 56 y.o. female  was evaluated in triage.  Pt complains of wheezing and shortness of breath.  History of asthma.  Use inhaler 2 hours ago.  No URI sx, fever or chills.  Increase symptoms after smoking "weed".  Review of Systems  Positive: Wheezing Negative: No fever, chills or recent illness.  Physical Exam  BP (!) 171/121   Pulse 95   Temp 99.5 F (37.5 C) (Oral)   Resp (!) 24   Ht '5\' 6"'$  (1.676 m)   Wt (!) 146.3 kg   LMP 02/22/2022 (Approximate)   SpO2 99%   BMI 52.06 kg/m  Gen:   Awake, no distress able to speak in short sentences. Resp:  Normal effort, expiratory wheezes heard throughout. MSK:   Moves extremities without difficulty  Other:    Medical Decision Making  Medically screening exam initiated at 2:05 PM.  Appropriate orders placed.  Brenda Hicks was informed that the remainder of the evaluation will be completed by another provider, this initial triage assessment does not replace that evaluation, and the importance of remaining in the ED until their evaluation is complete.     Johnn Hai, PA-C 11/06/22 1407

## 2022-11-06 NOTE — Discharge Instructions (Addendum)
Please take the steroids as prescribed for the next 4 days beginning tomorrow.  As we discussed this can make your blood sugar fluctuate significantly, so it is very important that you check your blood sugars daily and adjust your insulin as we discussed.  Please stop smoking.  Please return for any new, worsening, or change in symptoms or other concerns.

## 2022-11-06 NOTE — ED Notes (Signed)
Pt will go to [facility]. Pt needs duonebs asap. Spoke with PA and first nurse, made level 3 for now, vital signs are stable. Blue top and lactic drawn in case ordered.

## 2022-11-06 NOTE — ED Triage Notes (Signed)
Pt to ED for SOB, progressively worsening since 1 week.   States was recently in [facility] and was in pain so started smoking marijuana, which brought on this problem.   Has weak, congested cough and has marked audible wheezing without using stethoscope. Hx asthma. Has been using home inhalers, not helping. Voice sounds slightly hoarse. Denies recent illness.  Generalized body aches 10/10.  PA called into triage room to examine pt. No sepsis labs at this time.

## 2022-11-06 NOTE — ED Provider Notes (Signed)
[facility] Provider Note    Event Date/Time   First MD Initiated Contact with Patient 11/06/22 1438     (approximate)   History   Shortness of Breath and Cough   HPI  Brenda Hicks is a 56 y.o. female with a past medical history of obesity, asthma, former smoker and current marijuana user who presents today for evaluation of shortness of breath.  Patient reports that her symptoms began approximately 5 days but she feels that they have been worsening over the past 3 days.  She reports that she has been using her albuterol nebulizer at home, she has used it twice in the past 2 days.  She denies chest pain.  She reports that she has diffuse body aches.  She denies any known sick contacts.  She reports that she stopped smoking marijuana 4 days ago as this was worsening her shortness of breath.  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   Joint pain 11/03/2021   Osteoarthritis of left knee 10/08/2021   Pain due to onychomycosis of toenails of both feet 03/05/2021   Plantar fasciitis, bilateral 03/05/2021   Frequent bowel movements 04/25/2020   Abnormal Pap smear 03/31/2020   Arthritis 03/31/2020   Sprain of collateral ligament of right knee 03/10/2020   Leg swelling 02/08/2020   S/P cervical spinal fusion 09/24/2019   Hyperlipidemia associated with type 2 diabetes mellitus ([place]) 08/08/2019   Low back pain radiating to left lower extremity 08/07/2019   Nontraumatic incomplete tear of right rotator cuff 01/04/2019   Tendinitis of upper biceps tendon of right shoulder 01/04/2019   Rotator cuff tendinitis, right 12/01/2018   Cervical myelopathy ([place]) 08/09/2018   DJD (degenerative joint disease) of cervical spine 08/04/2018   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   History of ischemic stroke 03/30/2018   Ischemic chest pain ([place]) 03/29/2018   Chest pain 03/29/2018   Sebaceous cyst 06/01/2016   Obstructive apnea 05/31/2016   Acid reflux 05/31/2016   Essential hypertension 05/31/2016   Abnormal Pap smear of cervix 05/31/2016   Arthritis of knee, degenerative 07/25/2015   History of artificial joint 07/25/2015   Gonalgia 02/17/2015   Type 2 diabetes mellitus ([place]) 10/23/2014   Mixed conductive and sensorineural hearing loss, 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/2014   Diabetic polyneuropathy associated with type 2 diabetes mellitus ([place]) 05/16/2014   Endometrial polyp 11/01/2013   Fibroids, intramural 10/09/2013   Pain due to knee joint prosthesis ([place]) 10/05/2013   Body mass index (BMI) of 50-59.9 in adult ([place]) 10/03/2013   Abnormal uterine bleeding 10/03/2013   Morbid obesity ([place]) 10/01/2013   Excessive and frequent menstruation with irregular cycle 10/01/2013   Adaptive colitis 10/01/2013   History of migraine headaches 10/01/2013   H/O malignant neoplasm of skin 10/01/2013   Fatty liver disease, nonalcoholic 08/65/7846   Diverticulitis 10/01/2013   Former smoker 08/29/2013   H/O total knee replacement 08/29/2013   Insomnia 08/29/2013   Dysmenorrhea 08/29/2013   Airway hyperreactivity 08/29/2013   Absolute anemia 08/29/2013          Physical Exam   Triage Vital Signs: ED Triage Vitals  Enc Vitals Group     BP 11/06/22 1359 (!) 171/121     Pulse Rate 11/06/22 1359 95  Resp 11/06/22 1359 (!) 24     Temp 11/06/22 1359 99.5 F (37.5 C)     Temp Source 11/06/22 1359 Oral     SpO2 11/06/22 1359 99 %     Weight 11/06/22 1356 (!) 322 lb 8.5 oz (146.3 kg)     Height 11/06/22 1356 '5\' 6"'$  (1.676 m)     Head Circumference --      Peak Flow --      Pain Score 11/06/22 1356 10     Pain Loc --      Pain Edu? --      Excl. in [place]? --     Most recent vital signs: Vitals:   11/06/22  1541 11/06/22 1624  BP: (!) 162/94 (!) 150/86  Pulse: 94 91  Resp: 18 18  Temp: 98.1 F (36.7 C) 98.2 F (36.8 C)  SpO2: 95% 95%    Physical Exam Vitals and nursing note reviewed.  Constitutional:      General: Awake and alert. No acute distress.    Appearance: Normal appearance. The patient is obese HENT:     Head: Normocephalic and atraumatic.     Mouth: Mucous membranes are moist.  Eyes:     General: PERRL. Normal EOMs        Right eye: No discharge.        Left eye: No discharge.     Conjunctiva/sclera: Conjunctivae normal.  Cardiovascular:     Rate and Rhythm: Normal rate and regular rhythm.     Pulses: Normal pulses.     Heart sounds: Normal heart sounds Pulmonary:     Effort: Pulmonary effort is normal.  Able to speak in complete sentences, no audible wheezes, no accessory muscle use    Breath sounds: Expiratory wheezes in all lung fields Abdominal:     Abdomen is soft. There is no abdominal tenderness. No rebound or guarding. No distention. Musculoskeletal:        General: No swelling. Normal range of motion.     Cervical back: Normal range of motion and neck supple.  Skin:    General: Skin is warm and dry.     Capillary Refill: Capillary refill takes less than 2 seconds.     Findings: No rash.  Neurological:     Mental Status: The patient is awake and alert.      ED Results / Procedures / Treatments   Labs (all labs ordered are listed, but only abnormal results are displayed) Labs Reviewed  BASIC METABOLIC PANEL - Abnormal; Notable for the following components:      Result Value   Glucose, Bld 261 (*)    Creatinine, Ser 1.25 (*)    GFR, Estimated 51 (*)    All other components within normal limits  RESP PANEL BY RT-PCR (RSV, FLU A&B, COVID)  RVPGX2  CBC  POC URINE PREG, ED  TROPONIN I (HIGH SENSITIVITY)  TROPONIN I (HIGH SENSITIVITY)     EKG     RADIOLOGY I independently reviewed and interpreted imaging and agree with radiologists  findings.     PROCEDURES:  Critical Care performed:   Procedures   MEDICATIONS ORDERED IN ED: Medications  ipratropium-albuterol (DUONEB) 0.5-2.5 (3) MG/3ML nebulizer solution 3 mL (3 mLs Nebulization Given 11/06/22 1454)  ipratropium-albuterol (DUONEB) 0.5-2.5 (3) MG/3ML nebulizer solution 3 mL (3 mLs Nebulization Given 11/06/22 1454)  ipratropium-albuterol (DUONEB) 0.5-2.5 (3) MG/3ML nebulizer solution 3 mL (3 mLs Nebulization Given 11/06/22 1454)  predniSONE (DELTASONE) tablet 60 mg (60 mg Oral  Given 11/06/22 1622)     IMPRESSION / MDM / ASSESSMENT AND PLAN / ED COURSE  I reviewed the triage vital signs and the nursing notes.   Differential diagnosis includes, but is not limited to, asthma exacerbation, URI, influenza, pneumonia, bronchitis.  Patient is awake and alert, hemodynamically stable and afebrile.  She was placed on the cardiac monitor and continuous pulse oximetry.  She has audible wheezing, though has a normal oxygen saturation on room air and is not tachycardic.  She was treated initially with 3 DuoNebs, but continued to have wheezing.  In the meantime, labs are obtained and are overall reassuring including 2 negative troponins.  Her chest x-ray demonstrates no acute cardiopulmonary infiltrate or abnormality.  I discussed the risks and benefits of prednisone given that she is diabetic, but she would like to proceed with this.  1 dose of prednisone was given in the emergency department with complete resolution of her wheezes and improvement of her shortness of breath.  We discussed the pros and cons of short course of prednisone burst therapy and she would like to proceed with this which I feel is appropriate, however we discussed at length the importance of close glucose monitoring and control.  She reports that she checks her glucose daily and agrees to adjust her insulin as needed.  No chest pain or pleurisy or clinical signs or symptoms of DVT, or history of PE or DVT, no  tachycardia or hypoxia to suggest PE as a source of her symptoms today.  We discussed return precautions to the emergency department.  Patient understands and agrees with plan.  She was discharged in stable condition.   Patient's presentation is most consistent with acute complicated illness / injury requiring diagnostic workup.     FINAL CLINICAL IMPRESSION(S) / ED DIAGNOSES   Final diagnoses:  Mild intermittent asthma with exacerbation     Rx / DC Orders   ED Discharge Orders          Ordered    predniSONE (DELTASONE) 50 MG tablet        11/06/22 1653    albuterol (VENTOLIN HFA) 108 (90 Base) MCG/ACT inhaler  Every 6 hours PRN        11/06/22 1653             Note:  This document was prepared using Dragon voice recognition software and may include unintentional dictation errors.   Emeline Gins 11/06/22 1800    Lucillie Garfinkel, MD 11/06/22 Lurline Hare

## 2022-11-06 NOTE — ED Notes (Signed)
No signature pad noted.

## 2022-11-10 IMAGING — CT CT HEART MORP W/ CTA COR W/ SCORE W/ CA W/CM &/OR W/O CM
1 of 13 series · 4 of 20 positions shown, 5 images · non-contrast
Comparison: August 15, 2018

Addendum:
CLINICAL DATA: Chest pain

EXAM:
Cardiac/Coronary  CTA
TECHNIQUE: The patient was scanned on a Siemens Somatom go.Top scanner.

[Series 27: multiphase % cta coronary 0.60 · axial · 0.34mm/px · z∈[-1068,-994]mm · 4 of 3696 slices shown, 5 images]
[im 740/3696  vessel]
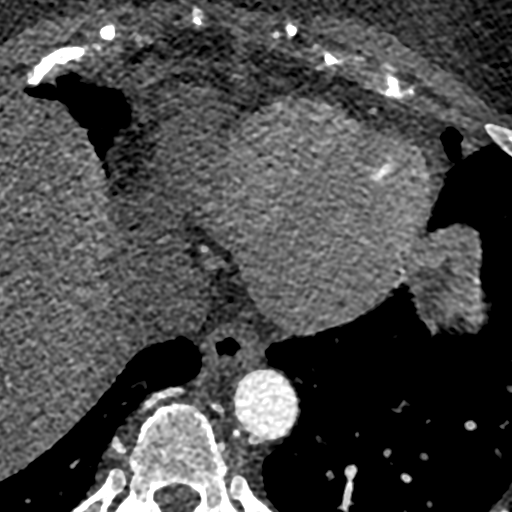
[im 740/3696  lung]
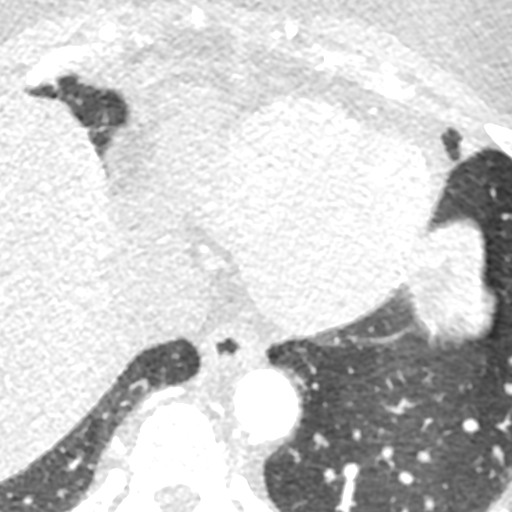
[im 1479/3696  vessel]
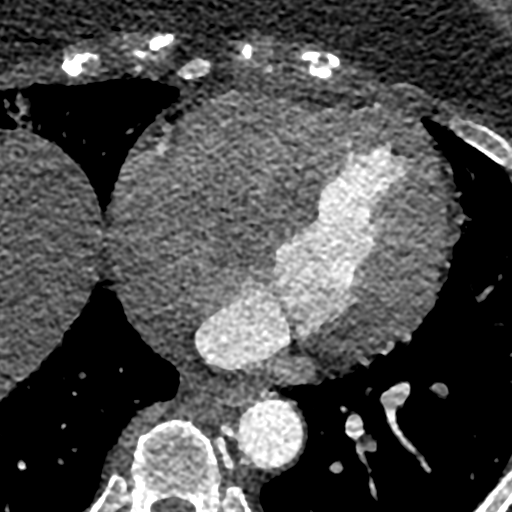
[im 2218/3696  vessel]
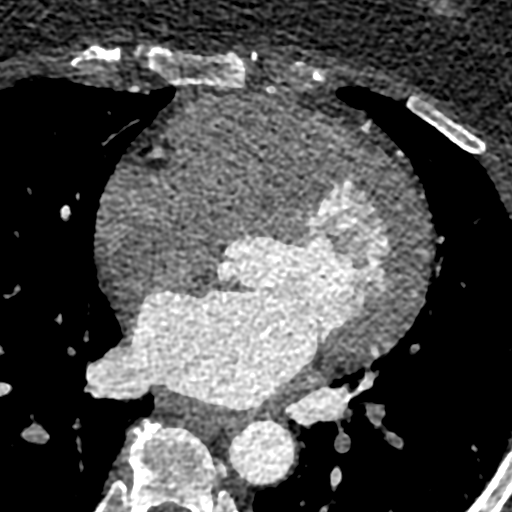
[im 2957/3696  vessel]
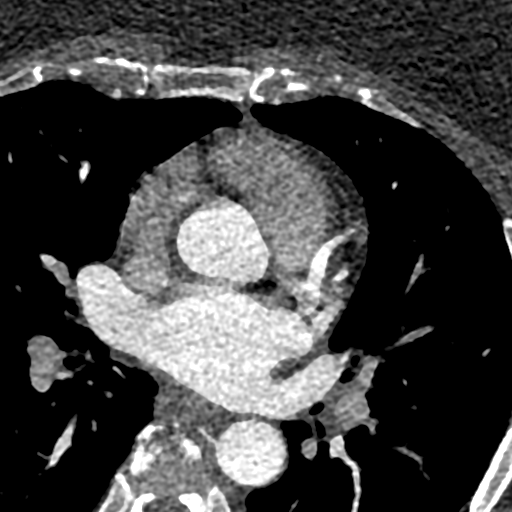

[4 of 20 positions shown; findings below may reference images not displayed]



Aortic Valve:  Trileaflet.  No calcifications.

Coronary Arteries:  Normal coronary origin.  Right dominance.

RCA is a dominant artery that gives rise to PDA and PLA. There is no
plaque.

Left main is a large artery that gives rise to LAD and LCX arteries.
There is no LM disease.

LAD has no plaque.

LCX is a non-dominant artery that gives rise to one OM1 branch.
There is no plaque.

Other findings:

Normal pulmonary vein drainage into the left atrium.

Normal left atrial appendage without a thrombus.

Normal size of the pulmonary artery.
IMPRESSION: 1. Normal coronary calcium score of 0. Patient is low risk for
coronary events.

2.  Normal coronary origin with right dominance.

3.  No evidence of CAD.

4. CAD-RADS 0. No evidence of CAD (0%). Consider non-atherosclerotic
causes of chest pain.

5.  Image quality degraded by obesity related artifacts.

ADDENDUM:
The following report is an over-read performed by radiologist Dr.
over-read does not include interpretation of cardiac or coronary
anatomy or pathology. The coronary calcium score/coronary CTA
interpretation by the cardiologist is attached.
FINDINGS: Vascular: No significant vascular finding.

Mediastinum/Nodes: No pathologically enlarged mediastinal or hilar
lymph nodes identified. Mild wall thickening of the distal esophagus
may reflect esophagitis or underdistention.

Lungs/Pleura: No suspicious pulmonary nodules or masses. No focal
airspace consolidation. No pleural effusion or pneumothorax.

Upper Abdomen: No acute abnormality.

Musculoskeletal: Thoracic spondylosis.
IMPRESSION: 1. No acute cardiopulmonary process.
2. Mild wall thickening of the distal esophagus may reflect
esophagitis or underdistention.



Aortic Valve:  Trileaflet.  No calcifications.

Coronary Arteries:  Normal coronary origin.  Right dominance.

RCA is a dominant artery that gives rise to PDA and PLA. There is no
plaque.

Left main is a large artery that gives rise to LAD and LCX arteries.
There is no LM disease.

LAD has no plaque.

LCX is a non-dominant artery that gives rise to one OM1 branch.
There is no plaque.

Other findings:

Normal pulmonary vein drainage into the left atrium.

Normal left atrial appendage without a thrombus.

Normal size of the pulmonary artery.
IMPRESSION: 1. Normal coronary calcium score of 0. Patient is low risk for
coronary events.

2.  Normal coronary origin with right dominance.

3.  No evidence of CAD.

4. CAD-RADS 0. No evidence of CAD (0%). Consider non-atherosclerotic
causes of chest pain.

5.  Image quality degraded by obesity related artifacts.

## 2022-11-15 ENCOUNTER — Emergency Department: Payer: Medicaid Other

## 2022-11-15 ENCOUNTER — Emergency Department
Admission: EM | Admit: 2022-11-15 | Discharge: 2022-11-15 | Disposition: A | Discharge status: Home / Self Care | Payer: Medicaid Other | Attending: Emergency Medicine | Admitting: Emergency Medicine

## 2022-11-15 ENCOUNTER — Other Ambulatory Visit: Payer: Self-pay

## 2022-11-15 DIAGNOSIS — M25562 Pain in left knee: Secondary | ICD-10-CM | POA: Diagnosis present

## 2022-11-15 DIAGNOSIS — M1712 Unilateral primary osteoarthritis, left knee: Secondary | ICD-10-CM | POA: Diagnosis not present

## 2022-11-15 MED ORDER — MELOXICAM 15 MG PO TABS: 15.0000 mg | ORAL_TABLET | Freq: Every day | ORAL | 0 refills | Status: DC

## 2022-11-15 NOTE — ED Provider Notes (Signed)
[facility] Provider Note  Patient Contact: 4:41 PM (approximate)   History   Knee Pain   HPI  Brenda Hicks is a 56 y.o. female who presents the emergency department complaining of knee pain.  Patient is seen orthopedics, has had multiple rounds of steroids and gel injected into her knee.  She has been told that she has need for knee replacement.  Patient states that she has had increased pain and was ensure there is no extra fluid inside her knee that needs to be withdrawn today.  No erythema, warmth, fevers or chills.  No other complaints at this time.     Physical Exam   Triage Vital Signs: ED Triage Vitals  Enc Vitals Group     BP 11/15/22 1518 (!) 180/100     Pulse Rate 11/15/22 1518 72     Resp 11/15/22 1518 20     Temp 11/15/22 1518 98.4 F (36.9 C)     Temp Source 11/15/22 1518 Oral     SpO2 11/15/22 1518 98 %     Weight 11/15/22 1519 (!) 321 lb 14 oz (146 kg)     Height 11/15/22 1519 '5\' 6"'$  (1.676 m)     Head Circumference --      Peak Flow --      Pain Score 11/15/22 1519 10     Pain Loc --      Pain Edu? --      Excl. in [place]? --     Most recent vital signs: Vitals:   11/15/22 1518  BP: (!) 180/100  Pulse: 72  Resp: 20  Temp: 98.4 F (36.9 C)  SpO2: 98%     General: Alert and in no acute distress.  Cardiovascular:  Good peripheral perfusion Respiratory: Normal respiratory effort without tachypnea or retractions. Lungs CTAB.  Musculoskeletal: Full range of motion to all extremities.  No visible abnormalities of the knee with deformity, erythema.  No warmth to palpation.  Good range of motion.  No ballottement. Neurologic:  No gross focal neurologic deficits are appreciated.  Skin:   No rash noted Other:   ED Results / Procedures / Treatments   Labs (all labs ordered are listed, but only abnormal results are displayed) Labs Reviewed - No data to display   EKG     RADIOLOGY  I personally viewed, evaluated, and  interpreted these images as part of my medical decision making, as well as reviewing the written report by the radiologist.  ED Provider Interpretation: Tricompartmental arthritis.  No effusion  DG Knee Complete 4 Views Left  Result Date: 11/15/2022 CLINICAL DATA:  knee pain EXAM: LEFT KNEE - COMPLETE 4+ VIEW COMPARISON:  July 30, 2022 FINDINGS: No acute fracture or dislocation. Tricompartmental degenerative changes with joint space narrowing and osteophyte formation most pronounced in the medial compartment. No area of erosion or osseous destruction. No unexpected radiopaque foreign body. Soft tissues are unremarkable. IMPRESSION: Tricompartmental degenerative changes most pronounced in the medial compartment. Electronically Signed   By: Valentino Saxon M.D.   On: 11/15/2022 15:54    PROCEDURES:  Critical Care performed: No  Procedures   MEDICATIONS ORDERED IN ED: Medications - No data to display   IMPRESSION / MDM / [place] / ED COURSE  I reviewed the triage vital signs and the nursing notes.  Differential diagnosis includes, but is not limited to, arthritis, knee sprain, occult fracture, joint effusion, gout, septic arthritis  Patient's presentation is most consistent with acute presentation with potential threat to life or bodily function.   Patient's diagnosis is consistent with osteoarthritis of the knee.  Patient was presented to the emergency department complaining of knee pain.  History of tricompartmental arthritis which is moderate to severe on x-ray.  No effusion.  No evidence of infection.  No indication for further workup.  Follow-up with orthopedics to discuss replacement.  Patient will have prescription for anti-inflammatory in the interim..  Patient is given ED precautions to return to the ED for any worsening or new symptoms.        FINAL CLINICAL IMPRESSION(S) / ED DIAGNOSES   Final diagnoses:  Tricompartment  osteoarthritis of left knee     Rx / DC Orders   ED Discharge Orders          Ordered    meloxicam (MOBIC) 15 MG tablet  Daily        11/15/22 1648             Note:  This document was prepared using Dragon voice recognition software and may include unintentional dictation errors.   Brynda Peon 11/15/22 1702    Vanessa Alsen, MD 11/15/22 2120

## 2022-11-15 NOTE — ED Triage Notes (Signed)
Pt to ED POV for L knee pain since 2 months, states "I think there's fluid on it" and states it is swollen and that she probably needs a knee replacement. Pt is ambulatory.

## 2022-11-16 ENCOUNTER — Encounter: Payer: Self-pay | Admitting: Internal Medicine

## 2022-11-19 ENCOUNTER — Other Ambulatory Visit: Payer: Self-pay | Admitting: Internal Medicine

## 2022-11-19 DIAGNOSIS — E1165 Type 2 diabetes mellitus with hyperglycemia: Secondary | ICD-10-CM

## 2022-11-19 MED ORDER — INSULIN GLARGINE 100 UNIT/ML ~~LOC~~ SOLN: 100.0000 [IU] | Freq: Every evening | SUBCUTANEOUS | 9 refills | Status: DC

## 2022-11-19 MED ORDER — INSULIN LISPRO 100 UNIT/ML IJ SOLN: 30.0000 [IU] | Freq: Three times a day (TID) | INTRAMUSCULAR | 11 refills | Status: DC

## 2022-11-24 ENCOUNTER — Encounter: Payer: Self-pay | Admitting: Internal Medicine

## 2022-11-29 ENCOUNTER — Other Ambulatory Visit: Payer: Self-pay | Admitting: Internal Medicine

## 2022-11-29 DIAGNOSIS — J45909 Unspecified asthma, uncomplicated: Secondary | ICD-10-CM

## 2022-11-29 MED ORDER — ALBUTEROL SULFATE HFA 108 (90 BASE) MCG/ACT IN AERS: 2.0000 | INHALATION_SPRAY | Freq: Four times a day (QID) | RESPIRATORY_TRACT | 3 refills | Status: DC | PRN

## 2022-12-01 ENCOUNTER — Other Ambulatory Visit: Payer: Self-pay | Admitting: Internal Medicine

## 2022-12-01 DIAGNOSIS — M1712 Unilateral primary osteoarthritis, left knee: Secondary | ICD-10-CM

## 2022-12-01 DIAGNOSIS — M4722 Other spondylosis with radiculopathy, cervical region: Secondary | ICD-10-CM

## 2022-12-01 DIAGNOSIS — M1990 Unspecified osteoarthritis, unspecified site: Secondary | ICD-10-CM

## 2022-12-03 ENCOUNTER — Ambulatory Visit
Admission: RE | Admit: 2022-12-03 | Discharge: 2022-12-03 | Disposition: A | Discharge status: Home / Self Care | Payer: Medicaid Other | Source: Ambulatory Visit | Attending: Internal Medicine | Admitting: Internal Medicine

## 2022-12-03 DIAGNOSIS — Z1231 Encounter for screening mammogram for malignant neoplasm of breast: Secondary | ICD-10-CM | POA: Insufficient documentation
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
## 2022-12-07 ENCOUNTER — Ambulatory Visit: Payer: Medicaid Other | Admitting: Internal Medicine

## 2022-12-07 ENCOUNTER — Ambulatory Visit
Admission: RE | Admit: 2022-12-07 | Discharge: 2022-12-07 | Disposition: A | Discharge status: Home / Self Care | Payer: Medicaid Other | Source: Ambulatory Visit | Attending: Internal Medicine | Admitting: Internal Medicine

## 2022-12-07 ENCOUNTER — Ambulatory Visit
Admission: RE | Admit: 2022-12-07 | Discharge: 2022-12-07 | Disposition: A | Discharge status: Home / Self Care | Payer: Medicaid Other | Attending: Internal Medicine | Admitting: Internal Medicine

## 2022-12-07 ENCOUNTER — Encounter: Payer: Self-pay | Admitting: Internal Medicine

## 2022-12-07 VITALS — BP 132/86 | HR 96 | Temp 97.6°F | Resp 18 | Ht 66.0 in | Wt 325.1 lb

## 2022-12-07 DIAGNOSIS — E1165 Type 2 diabetes mellitus with hyperglycemia: Secondary | ICD-10-CM | POA: Diagnosis not present

## 2022-12-07 DIAGNOSIS — M1990 Unspecified osteoarthritis, unspecified site: Secondary | ICD-10-CM

## 2022-12-07 DIAGNOSIS — Z794 Long term (current) use of insulin: Secondary | ICD-10-CM

## 2022-12-07 DIAGNOSIS — R051 Acute cough: Secondary | ICD-10-CM

## 2022-12-07 MED ORDER — DULOXETINE HCL 60 MG PO CPEP: 60.0000 mg | ORAL_CAPSULE | Freq: Every day | ORAL | 1 refills | Status: DC

## 2022-12-07 MED ORDER — TRULICITY 1.5 MG/0.5ML ~~LOC~~ SOAJ: 1.5000 mg | SUBCUTANEOUS | 2 refills | Status: DC

## 2022-12-07 MED ORDER — INSULIN GLARGINE 100 UNIT/ML ~~LOC~~ SOLN: 100.0000 [IU] | Freq: Every evening | SUBCUTANEOUS | 9 refills | Status: DC
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
## 2022-12-07 NOTE — Patient Instructions (Addendum)
It was great seeing you today!  Plan discussed at today's visit: -Lantus and Trulicity sent to pharmacy  -Cymbalta increased to 60 mg  -Chest x-ray today and inhaler to use daily while sick, can use Albuterol every 2 hours as needed for wheezing   Follow up in: 2 months after 3/5  Take care and let us know if you have any questions or concerns prior to your next visit.  Dr. Rosana Berger

## 2022-12-14 ENCOUNTER — Encounter: Payer: Self-pay | Admitting: Orthopedic Surgery

## 2022-12-14 ENCOUNTER — Encounter: Payer: Self-pay | Admitting: Internal Medicine

## 2022-12-14 ENCOUNTER — Ambulatory Visit (INDEPENDENT_AMBULATORY_CARE_PROVIDER_SITE_OTHER): Payer: Medicaid Other | Admitting: Orthopedic Surgery

## 2022-12-14 VITALS — BP 130/82 | Ht 66.0 in | Wt 321.6 lb

## 2022-12-14 DIAGNOSIS — M5416 Radiculopathy, lumbar region: Secondary | ICD-10-CM

## 2022-12-14 DIAGNOSIS — M47816 Spondylosis without myelopathy or radiculopathy, lumbar region: Secondary | ICD-10-CM

## 2022-12-14 DIAGNOSIS — Z981 Arthrodesis status: Secondary | ICD-10-CM

## 2022-12-14 DIAGNOSIS — M4726 Other spondylosis with radiculopathy, lumbar region: Secondary | ICD-10-CM | POA: Diagnosis not present

## 2022-12-14 DIAGNOSIS — M47812 Spondylosis without myelopathy or radiculopathy, cervical region: Secondary | ICD-10-CM

## 2022-12-14 DIAGNOSIS — M48061 Spinal stenosis, lumbar region without neurogenic claudication: Secondary | ICD-10-CM | POA: Diagnosis not present

## 2022-12-14 DIAGNOSIS — M5412 Radiculopathy, cervical region: Secondary | ICD-10-CM

## 2022-12-14 DIAGNOSIS — M4722 Other spondylosis with radiculopathy, cervical region: Secondary | ICD-10-CM | POA: Diagnosis not present

## 2022-12-14 NOTE — Patient Instructions (Signed)
It was so nice to see you today. Thank you so much for coming in.    Your MRI reports showed disc bulging above and below your fusion (C3-C4 and C5-C6) with some irritation of the nerves. Your fusion was at C4-C5.   You have wear and tear in your lower back with spinal stenosis at L4-L5.   I need to see these pictures before we decide on a treatment plan. Please get the cervical and lumbar MRI from 09/25/22 on a CD and drop it off for me to review.   Once I review the CD, we may consider injections with Dr. Alba Destine. May also consider physical therapy. You would need to do PT prior to considering any surgery for your neck.   You should hear back from me a day or two after you drop off the CD.   Please do not hesitate to call if you have any questions or concerns. You can also message me in [place].   If you have not heard back about any of the above things in the next week, please call the office so we can help you get them scheduled.   Geronimo Boot PA-C [phone]

## 2022-12-15 ENCOUNTER — Other Ambulatory Visit: Payer: Self-pay

## 2022-12-15 ENCOUNTER — Emergency Department
Admission: EM | Admit: 2022-12-15 | Discharge: 2022-12-15 | Disposition: A | Discharge status: Home / Self Care | Payer: Medicaid Other | Attending: Emergency Medicine | Admitting: Emergency Medicine

## 2022-12-15 DIAGNOSIS — M5432 Sciatica, left side: Secondary | ICD-10-CM

## 2022-12-15 DIAGNOSIS — M5442 Lumbago with sciatica, left side: Secondary | ICD-10-CM | POA: Insufficient documentation

## 2022-12-15 DIAGNOSIS — M545 Low back pain, unspecified: Secondary | ICD-10-CM | POA: Diagnosis present

## 2022-12-15 LAB — URINALYSIS, ROUTINE W REFLEX MICROSCOPIC
Bilirubin Urine: NEGATIVE
Glucose, UA: NEGATIVE mg/dL
Hgb urine dipstick: NEGATIVE
Ketones, ur: NEGATIVE mg/dL
Leukocytes,Ua: NEGATIVE
Nitrite: NEGATIVE
Protein, ur: NEGATIVE mg/dL
Specific Gravity, Urine: 1.012 (ref 1.005–1.030)
pH: 6 (ref 5.0–8.0)

## 2022-12-15 LAB — CBC WITH DIFFERENTIAL/PLATELET
Abs Immature Granulocytes: 0.02 10*3/uL (ref 0.00–0.07)
Basophils Absolute: 0 10*3/uL (ref 0.0–0.1)
Basophils Relative: 0 %
Eosinophils Absolute: 0 10*3/uL (ref 0.0–0.5)
Eosinophils Relative: 1 %
HCT: 44 % (ref 36.0–46.0)
Hemoglobin: 13.7 g/dL (ref 12.0–15.0)
Immature Granulocytes: 0 %
Lymphocytes Relative: 28 %
Lymphs Abs: 1.6 10*3/uL (ref 0.7–4.0)
MCH: 29.6 pg (ref 26.0–34.0)
MCHC: 31.1 g/dL (ref 30.0–36.0)
MCV: 95 fL (ref 80.0–100.0)
Monocytes Absolute: 0.4 10*3/uL (ref 0.1–1.0)
Monocytes Relative: 6 %
Neutro Abs: 3.7 10*3/uL (ref 1.7–7.7)
Neutrophils Relative %: 65 %
Platelets: 233 10*3/uL (ref 150–400)
RBC: 4.63 MIL/uL (ref 3.87–5.11)
RDW: 13.7 % (ref 11.5–15.5)
WBC: 5.7 10*3/uL (ref 4.0–10.5)
nRBC: 0 % (ref 0.0–0.2)

## 2022-12-15 LAB — COMPREHENSIVE METABOLIC PANEL
ALT: 18 U/L (ref 0–44)
AST: 21 U/L (ref 15–41)
Albumin: 4 g/dL (ref 3.5–5.0)
Alkaline Phosphatase: 77 U/L (ref 38–126)
Anion gap: 7 (ref 5–15)
BUN: 14 mg/dL (ref 6–20)
CO2: 28 mmol/L (ref 22–32)
Calcium: 8.8 mg/dL — ABNORMAL LOW (ref 8.9–10.3)
Chloride: 103 mmol/L (ref 98–111)
Creatinine, Ser: 0.82 mg/dL (ref 0.44–1.00)
GFR, Estimated: >60 mL/min (ref >60)
Glucose, Bld: 78 mg/dL (ref 70–99)
Potassium: 3.7 mmol/L (ref 3.5–5.1)
Sodium: 138 mmol/L (ref 135–145)
Total Bilirubin: 0.4 mg/dL (ref 0.3–1.2)
Total Protein: 7.8 g/dL (ref 6.5–8.1)

## 2022-12-15 LAB — CBG MONITORING, ED
Glucose-Capillary: 75 mg/dL (ref 70–99)
Glucose-Capillary: 69 mg/dL — ABNORMAL LOW (ref 70–99)
Glucose-Capillary: 132 mg/dL — ABNORMAL HIGH (ref 70–99)
Glucose-Capillary: 101 mg/dL — ABNORMAL HIGH (ref 70–99)

## 2022-12-15 MED ORDER — PREDNISONE 10 MG (21) PO TBPK: ORAL_TABLET | ORAL | 0 refills | Status: DC

## 2022-12-15 MED ORDER — LIVING WELL WITH DIABETES BOOK
Freq: Once | Status: AC
  Filled 2022-12-15: qty 1

## 2022-12-15 MED ORDER — KETOROLAC TROMETHAMINE 10 MG PO TABS: 10.0000 mg | ORAL_TABLET | Freq: Three times a day (TID) | ORAL | 0 refills | Status: DC | PRN

## 2022-12-15 MED ORDER — GABAPENTIN 300 MG PO CAPS
300.0000 mg | ORAL_CAPSULE | Freq: Once | ORAL | Status: AC
  Administered 2022-12-15: 300 mg via ORAL
  Filled 2022-12-15: qty 1

## 2022-12-15 MED ORDER — KETOROLAC TROMETHAMINE 60 MG/2ML IM SOLN
30.0000 mg | Freq: Once | INTRAMUSCULAR | Status: AC
  Administered 2022-12-15: 30 mg via INTRAMUSCULAR
  Filled 2022-12-15: qty 2

## 2022-12-15 NOTE — ED Notes (Signed)
Pt up to bathroom, stand by assist
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
## 2022-12-15 NOTE — ED Notes (Signed)
Attempted to draw labs x 2, unsuccessful.
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
## 2022-12-15 NOTE — Progress Notes (Unsigned)
LMP 02/22/2022 (Approximate)    Subjective:    Patient ID: Brenda Hicks, female    DOB: 09/14/1966, 57 y.o.   MRN: [id number]  HPI: Brenda Hicks is a 57 y.o. female  No chief complaint on file.   Relevant past medical, surgical, family and social history reviewed and updated as indicated. Interim medical history since our last visit reviewed. Allergies and medications reviewed and updated.  Review of Systems  Constitutional: Negative for fever or weight change.  Respiratory: Negative for cough and shortness of breath.   Cardiovascular: Negative for chest pain or palpitations.  Gastrointestinal: Negative for abdominal pain, no bowel changes.  Musculoskeletal: Negative for gait problem or joint swelling.  Skin: Negative for rash.  Neurological: Negative for dizziness or headache.  No other specific complaints in a complete review of systems (except as listed in HPI above).      Objective:    LMP 02/22/2022 (Approximate)   Wt Readings from Last 3 Encounters:  12/15/22 (!) 321 lb 6.9 oz (145.8 kg)  12/14/22 (!) 321 lb 9.6 oz (145.9 kg)  12/07/22 (!) 325 lb 1.6 oz (147.5 kg)    Physical Exam  Constitutional: Patient appears well-developed and well-nourished. Obese *** No distress.  HEENT: head atraumatic, normocephalic, pupils equal and reactive to light, ears ***, neck supple, throat within normal limits Cardiovascular: Normal rate, regular rhythm and normal heart sounds.  No murmur heard. No BLE edema. Pulmonary/Chest: Effort normal and breath sounds normal. No respiratory distress. Abdominal: Soft.  There is no tenderness. Psychiatric: Patient has a normal mood and affect. behavior is normal. Judgment and thought content normal.  Results for orders placed or performed during the hospital encounter of 12/15/22  CBC with Differential  Result Value Ref Range   WBC 5.7 4.0 - 10.5 K/uL   RBC 4.63 3.87 - 5.11 MIL/uL   Hemoglobin 13.7 12.0 - 15.0 g/dL   HCT 44.0 36.0 -  46.0 %   MCV 95.0 80.0 - 100.0 fL   MCH 29.6 26.0 - 34.0 pg   MCHC 31.1 30.0 - 36.0 g/dL   RDW 13.7 11.5 - 15.5 %   Platelets 233 150 - 400 K/uL   nRBC 0.0 0.0 - 0.2 %   Neutrophils Relative % 65 %   Neutro Abs 3.7 1.7 - 7.7 K/uL   Lymphocytes Relative 28 %   Lymphs Abs 1.6 0.7 - 4.0 K/uL   Monocytes Relative 6 %   Monocytes Absolute 0.4 0.1 - 1.0 K/uL   Eosinophils Relative 1 %   Eosinophils Absolute 0.0 0.0 - 0.5 K/uL   Basophils Relative 0 %   Basophils Absolute 0.0 0.0 - 0.1 K/uL   Immature Granulocytes 0 %   Abs Immature Granulocytes 0.02 0.00 - 0.07 K/uL  Comprehensive metabolic panel  Result Value Ref Range   Sodium 138 135 - 145 mmol/L   Potassium 3.7 3.5 - 5.1 mmol/L   Chloride 103 98 - 111 mmol/L   CO2 28 22 - 32 mmol/L   Glucose, Bld 78 70 - 99 mg/dL   BUN 14 6 - 20 mg/dL   Creatinine, Ser 0.82 0.44 - 1.00 mg/dL   Calcium 8.8 (L) 8.9 - 10.3 mg/dL   Total Protein 7.8 6.5 - 8.1 g/dL   Albumin 4.0 3.5 - 5.0 g/dL   AST 21 15 - 41 U/L   ALT 18 0 - 44 U/L   Alkaline Phosphatase 77 38 - 126 U/L   Total Bilirubin 0.4 0.3 -  1.2 mg/dL   GFR, Estimated >60 >60 mL/min   Anion gap 7 5 - 15  Urinalysis, Routine w reflex microscopic -Urine, Clean Catch  Result Value Ref Range   Color, Urine STRAW (A) YELLOW   APPearance CLEAR (A) CLEAR   Specific Gravity, Urine 1.012 1.005 - 1.030   pH 6.0 5.0 - 8.0   Glucose, UA NEGATIVE NEGATIVE mg/dL   Hgb urine dipstick NEGATIVE NEGATIVE   Bilirubin Urine NEGATIVE NEGATIVE   Ketones, ur NEGATIVE NEGATIVE mg/dL   Protein, ur NEGATIVE NEGATIVE mg/dL   Nitrite NEGATIVE NEGATIVE   Leukocytes,Ua NEGATIVE NEGATIVE  POC CBG, ED  Result Value Ref Range   Glucose-Capillary 75 70 - 99 mg/dL  CBG monitoring, ED  Result Value Ref Range   Glucose-Capillary 69 (L) 70 - 99 mg/dL  CBG monitoring, ED  Result Value Ref Range   Glucose-Capillary 132 (H) 70 - 99 mg/dL      Assessment & Plan:   Problem List Items Addressed This Visit    None    Follow up plan: No follow-ups on file.

## 2022-12-15 NOTE — Discharge Instructions (Signed)
Please seek medical attention for any high fevers, chest pain, shortness of breath, change in behavior, persistent vomiting, bloody stool or any other new or concerning symptoms.  

## 2022-12-15 NOTE — ED Notes (Signed)
Pt given a cup of OJ at this time.

## 2022-12-15 NOTE — Inpatient Diabetes Management (Signed)
Inpatient Diabetes Program Recommendations  AACE/ADA: New Consensus Statement on Inpatient Glycemic Control (2015)  Target Ranges:  Prepandial:   less than 140 mg/dL      Peak postprandial:   less than 180 mg/dL (1-2 hours)      Critically ill patients:  140 - 180 mg/dL   Lab Results  Component Value Date   GLUCAP 101 (H) 12/15/2022   HGBA1C 9.3 (A) 10/26/2022    Review of Glycemic Control  Diabetes history: DM2 Outpatient Diabetes medications: Lantus 100 units qd, Humalog 30 units tid meal coverage, Trulicity 1.5 mg q week (has been out x 2 months) Current orders for Inpatient glycemic control: CBGs only  Inpatient Diabetes Program Recommendations:   Spoke with pt. Regarding hypoglycemic events @ home. Patient has recently changed her eating habits and has been eating healthier but taking the same amount of short acting insulin. Patient has not had carbohydrate counting to base dosing on but willing to discuss options of carb counting versus small, moderate or large meal doses. Patient has also been participating fasting 6-12 hrs. Per day with her church family but stopped due to having more low CBGs and shakiness. Patient acknowledges understanding and has appointment with her MD in the morning. Patient plans to take glucose meter with her and discuss insulin dosing with her PCP. Reviewed basic nutrition information with patient regarding plate method including proteins and not only carbohydrates. Patient drinks mostly water along with sugar free drinks.  Thank you, Nani Gasser. Avontae Burkhead, RN, MSN, CDE  Diabetes Coordinator Inpatient Glycemic Control Team Team Pager [phone] (8am-5pm) 12/15/2022 1:48 PM

## 2022-12-15 NOTE — ED Notes (Signed)
This RN to bedside for assessment, pt sitting in stretcher, listening to music on her phone, resp. Rate normal and unlabored, NAD. Pt. States her blood glucose level has been going low x2 days. Pt. Ate sandwich, juice and apple sauce in triage.

## 2022-12-16 ENCOUNTER — Ambulatory Visit (INDEPENDENT_AMBULATORY_CARE_PROVIDER_SITE_OTHER): Payer: Medicaid Other | Admitting: Nurse Practitioner

## 2022-12-16 ENCOUNTER — Encounter: Payer: Self-pay | Admitting: Nurse Practitioner

## 2022-12-16 ENCOUNTER — Other Ambulatory Visit: Payer: Self-pay

## 2022-12-16 VITALS — BP 132/84 | HR 100 | Temp 98.1°F | Resp 16 | Ht 66.0 in | Wt 324.7 lb

## 2022-12-16 DIAGNOSIS — L0291 Cutaneous abscess, unspecified: Secondary | ICD-10-CM

## 2022-12-16 MED ORDER — DOXYCYCLINE HYCLATE 100 MG PO TABS: 100.0000 mg | ORAL_TABLET | Freq: Two times a day (BID) | ORAL | 0 refills | Status: AC

## 2022-12-22 ENCOUNTER — Ambulatory Visit
Admission: RE | Admit: 2022-12-22 | Discharge: 2022-12-22 | Disposition: A | Discharge status: Home / Self Care | Payer: Self-pay | Source: Ambulatory Visit | Attending: Orthopedic Surgery | Admitting: Orthopedic Surgery

## 2022-12-22 ENCOUNTER — Other Ambulatory Visit: Payer: Self-pay

## 2022-12-22 DIAGNOSIS — Z049 Encounter for examination and observation for unspecified reason: Secondary | ICD-10-CM
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
## 2022-12-28 ENCOUNTER — Ambulatory Visit (INDEPENDENT_AMBULATORY_CARE_PROVIDER_SITE_OTHER): Payer: Medicaid Other | Admitting: Orthopedic Surgery

## 2022-12-28 ENCOUNTER — Encounter: Payer: Self-pay | Admitting: Orthopedic Surgery

## 2022-12-28 DIAGNOSIS — M5416 Radiculopathy, lumbar region: Secondary | ICD-10-CM

## 2022-12-28 DIAGNOSIS — M5412 Radiculopathy, cervical region: Secondary | ICD-10-CM

## 2022-12-28 DIAGNOSIS — M48061 Spinal stenosis, lumbar region without neurogenic claudication: Secondary | ICD-10-CM

## 2022-12-28 DIAGNOSIS — Z981 Arthrodesis status: Secondary | ICD-10-CM

## 2022-12-28 DIAGNOSIS — M4722 Other spondylosis with radiculopathy, cervical region: Secondary | ICD-10-CM

## 2022-12-28 DIAGNOSIS — M47812 Spondylosis without myelopathy or radiculopathy, cervical region: Secondary | ICD-10-CM

## 2022-12-28 DIAGNOSIS — M4726 Other spondylosis with radiculopathy, lumbar region: Secondary | ICD-10-CM

## 2022-12-28 DIAGNOSIS — M47816 Spondylosis without myelopathy or radiculopathy, lumbar region: Secondary | ICD-10-CM

## 2022-12-28 NOTE — Patient Instructions (Signed)
Call [facility] to schedule [phone].

## 2023-01-27 ENCOUNTER — Ambulatory Visit: Admit: 2023-01-27 | Payer: No Typology Code available for payment source | Admitting: Surgery

## 2023-01-27 SURGERY — ARTHROPLASTY, KNEE, TOTAL
Anesthesia: Choice | Site: Knee | Laterality: Left

## 2023-02-04 ENCOUNTER — Other Ambulatory Visit: Payer: Self-pay | Admitting: Surgery
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
## 2023-02-07 ENCOUNTER — Ambulatory Visit (INDEPENDENT_AMBULATORY_CARE_PROVIDER_SITE_OTHER): Payer: Medicaid Other | Admitting: Internal Medicine

## 2023-02-07 ENCOUNTER — Encounter: Payer: Self-pay | Admitting: Internal Medicine

## 2023-02-07 VITALS — BP 136/88 | HR 96 | Temp 97.9°F | Resp 18 | Ht 66.0 in | Wt 321.4 lb

## 2023-02-07 DIAGNOSIS — Z794 Long term (current) use of insulin: Secondary | ICD-10-CM

## 2023-02-07 DIAGNOSIS — I10 Essential (primary) hypertension: Secondary | ICD-10-CM | POA: Diagnosis not present

## 2023-02-07 DIAGNOSIS — E1165 Type 2 diabetes mellitus with hyperglycemia: Secondary | ICD-10-CM

## 2023-02-07 DIAGNOSIS — F339 Major depressive disorder, recurrent, unspecified: Secondary | ICD-10-CM

## 2023-02-07 LAB — POCT GLYCOSYLATED HEMOGLOBIN (HGB A1C): Hemoglobin A1C: 10 % — AB (ref 4.0–5.6)

## 2023-02-07 MED ORDER — SERTRALINE HCL 25 MG PO TABS: 25.0000 mg | ORAL_TABLET | Freq: Every day | ORAL | 0 refills | Status: DC

## 2023-02-07 MED ORDER — INSULIN GLARGINE 100 UNIT/ML ~~LOC~~ SOLN: 60.0000 [IU] | Freq: Two times a day (BID) | SUBCUTANEOUS | 9 refills | Status: DC

## 2023-02-07 MED ORDER — TRULICITY 3 MG/0.5ML ~~LOC~~ SOAJ: 3.0000 mg | SUBCUTANEOUS | 2 refills | Status: DC

## 2023-02-07 NOTE — Patient Instructions (Addendum)
It was great seeing you today!  Plan discussed at today's visit: -A1c 10.0 -Change Lantus to 30 units twice a day, continue Humalog 30 units only with meals -Trulicity changed to 3 mg weekly, keep me up to date with prescription -Start Zoloft 25 mg   Follow up in: 3 months   Take care and let us know if you have any questions or concerns prior to your next visit.  Dr. Rosana Berger

## 2023-02-08 ENCOUNTER — Encounter (HOSPITAL_COMMUNITY): Payer: Self-pay | Admitting: Urgent Care

## 2023-02-08 ENCOUNTER — Encounter: Payer: Self-pay | Admitting: Internal Medicine

## 2023-02-08 NOTE — Progress Notes (Signed)
  Perioperative Services: Pre-Admission/Anesthesia Testing  Abnormal Lab Notification    Date: 02/08/23  Name: Brenda Hicks MRN:   [id number]  Re: Abnormal labs noted during PAT appointment   Provider(s) Notified: Poggi, Marshall Cork, MD Notification mode: Routed and/or faxed via [facility]   ABNORMAL LAB VALUE(S): Lab Results  Component Value Date   HGBA1C 10.0 (A) 02/07/2023    Clinical Notes/Discussion:  Patient with a T2DM diagnosis. She is currently on parenteral therapies (insulin glargine and lispro). There was inconsistencies with dosing due to self adjustment due to low glucose readings. GLP1 (dulaglutide) prescribed months ago by PCP, however patient reports no communication from insurance or pharmacy. PCP not made aware of any insurance issues until yesterday. New Rx was sent. Patient awaiting new medication. Of note, patient will be off of the GLP1 medication for 7 days prior to surgery, therefore I anticipate little to no overall benefit of this intervention preoperatively prior to her upcoming surgery.   Patient demonstrating worsening glycemic control. Her A1c values are as follows:  Lab Results  Component Value Date   HGBA1C 10.0 (A) 02/07/2023   HGBA1C 9.3 (A) 10/26/2022   HGBA1C 7.8 (A) 07/27/2022   HGBA1C 7.8 (H) 03/15/2022   HGBA1C 8.4 (H) 12/14/2021   In efforts to reduce the risk of developing SSI/PJI, or other potential perioperative complications, this communication is being sent in order to determine if patient is deemed to have adequate medical optimization, including preoperative glycemic control.   The odds ratio for SSI/PJI infection is between 2.8 and 3.4 for orthopedic surgery patients with pre-operative serum glucose levels of > 125 mg/dL or a post-operative levels of > 200 mg/dL ([place], 2019).    Data suggests that a Hgb A1c threshold of 7.7% tends to be more indicative of infection than the commonly used 7% and should perhaps be the  pre-operative patient optimization goal Carolin Guernsey et al., 2017).   With that being said, the benefit of improving glycemic control must be weighed against the overall risk associated with delaying a necessary elective orthopedic surgery for this patient.   Citations: Charlett Blake, A.F. Reducing the risk of infection after total joint arthroplasty: preoperative optimization. Arthroplasty 1, 4 (2019). [email or web address]  Lorrin Goodell MM, [place], Brigati D, [place] SM, [address] [place] JC, [place], [place], [place], Parvizi Lenna Sciara [place]. Determining the Threshold for HbA1c as a Predictor for Adverse Outcomes After Total Joint Arthroplasty: A Multicenter, Retrospective Study. J Arthroplasty. 2017 Sep;32(9S):S263-S267.e1. [email or web address].   Honor Loh, MSN, APRN, FNP-C, CEN [facility]  Peri-operative Services Nurse Practitioner Phone: [phone] 02/08/23 4:17 PM

## 2023-02-09 ENCOUNTER — Inpatient Hospital Stay: Admission: RE | Admit: 2023-02-09 | Payer: Medicaid Other | Source: Ambulatory Visit
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
## 2023-02-15 ENCOUNTER — Encounter: Payer: Self-pay | Admitting: Orthopedic Surgery

## 2023-02-15 ENCOUNTER — Ambulatory Visit (INDEPENDENT_AMBULATORY_CARE_PROVIDER_SITE_OTHER): Payer: Medicaid Other | Admitting: Orthopedic Surgery

## 2023-02-15 ENCOUNTER — Ambulatory Visit
Admission: RE | Admit: 2023-02-15 | Discharge: 2023-02-15 | Disposition: A | Discharge status: Home / Self Care | Payer: Medicaid Other | Source: Ambulatory Visit | Attending: Orthopedic Surgery | Admitting: Orthopedic Surgery

## 2023-02-15 VITALS — BP 130/82 | Ht 66.0 in | Wt 321.0 lb

## 2023-02-15 DIAGNOSIS — M48061 Spinal stenosis, lumbar region without neurogenic claudication: Secondary | ICD-10-CM | POA: Diagnosis not present

## 2023-02-15 DIAGNOSIS — Z981 Arthrodesis status: Secondary | ICD-10-CM

## 2023-02-15 DIAGNOSIS — M4722 Other spondylosis with radiculopathy, cervical region: Secondary | ICD-10-CM | POA: Diagnosis not present

## 2023-02-15 DIAGNOSIS — M4726 Other spondylosis with radiculopathy, lumbar region: Secondary | ICD-10-CM | POA: Diagnosis not present

## 2023-02-15 DIAGNOSIS — M5416 Radiculopathy, lumbar region: Secondary | ICD-10-CM

## 2023-02-15 DIAGNOSIS — M79662 Pain in left lower leg: Secondary | ICD-10-CM | POA: Diagnosis not present

## 2023-02-15 DIAGNOSIS — M47812 Spondylosis without myelopathy or radiculopathy, cervical region: Secondary | ICD-10-CM

## 2023-02-15 DIAGNOSIS — M5412 Radiculopathy, cervical region: Secondary | ICD-10-CM

## 2023-02-15 DIAGNOSIS — M47816 Spondylosis without myelopathy or radiculopathy, lumbar region: Secondary | ICD-10-CM

## 2023-02-15 NOTE — Patient Instructions (Signed)
It was so nice to see you today. Thank you so much for coming in.    I'm so sorry about your mom.   Go to get the doppler of your left leg. If this is negative, then follow up with your PCP regarding swelling.   Follow up with ortho for your left shoulder pain.   Start PT when you are ready. I will resend orders to [facility]. You can call them at [phone].   Continue to work on weight loss.   I will see you back in 2-3 months.   Please do not hesitate to call if you have any questions or concerns. You can also message me in [place].   Geronimo Boot PA-C [phone]

## 2023-02-15 NOTE — Progress Notes (Signed)
Per discussion with Marzetta Board, I notified Ms Doorley that her study was negative and I advised her to follow up with Dr Rosana Berger.

## 2023-02-16 NOTE — Progress Notes (Unsigned)
Established Patient Office Visit  Subjective:  Patient ID: Brenda Hicks, female    DOB: [birth year]  Age: 57 y.o. MRN: [id number]  CC:  No chief complaint on file.   HPI Brenda Hicks presents for follow up on chronic medical conditions and leg swelling.   Leg Swelling:   Hypertension, OSA: -Medications: Currently on Lisinopril 40 mg  -Patient is compliant with medications and reports no side effects.  -Checking BP at home: no -Denies any SOB, CP, LE edema, medication SEs, or symptoms of hypotension   Diabetes, Type 2 -Last A1c 9.3% 12/23 -Medications: Supposed to be on Lantus 100u daily at bedtime but was going low in the morning so she decreased it to 50 units at night. Also taking Humalog 30u TID. Trulicity was prescribed for multiple months previously, received no communication from [facility], pharmacy or patient in regards to this. Patient says pharmacy won't fill medication.  -Diet: Her mother passed away last month, exacerbating her depression so she is eating worse.  -Exercise: None right now due to her back pain and knee pain. Has knee surgery scheduled -Eye exam: UTD 1/24 -Foot exam: UTD 9/23 -Microalbumin: UTD 12/23 on ACEI -Statin: yes -PNA vaccine: Prevnar 23 in 2014  MDD: -Mood status: exacerbated -Current treatment: Cymbalta 60 mg increased at LOV but patient had nausea/vomiting and discontinued this. Not on anything currently.  -States she is having trouble sleeping. Has been on Trazodone and Doxepin in the past states that both medications "don't work" and Ambien is the only thing that has worked in the past for sleep.      02/07/2023    9:36 AM 12/16/2022   11:43 AM 12/07/2022   10:24 AM 10/26/2022   10:06 AM 09/21/2022   11:12 AM  Depression screen PHQ 2/9  Decreased Interest 3 0 2 3 0  Down, Depressed, Hopeless 3 0 0 2 0  PHQ - 2 Score 6 0 2 5 0  Altered sleeping 3  0 1 0  Tired, decreased energy 3  0 1 0  Change in appetite 2  0 2 0   Feeling bad or failure about yourself  0  0 1 0  Trouble concentrating 0  0 1 0  Moving slowly or fidgety/restless 1  0 0 0  Suicidal thoughts 0  0 0 0  PHQ-9 Score 15  2 11  0  Difficult doing work/chores Somewhat difficult  Not difficult at all Not difficult at all Not difficult at all    HLD: -Medications: Lipitor 40 mg -Tolerating medication well, no side effects, compliant.  -Last lipid panel Lipid Panel     Component Value Date/Time   CHOL 185 12/14/2021 1019   TRIG 109 12/14/2021 1019   HDL 41 (L) 12/14/2021 1019   CHOLHDL 4.5 12/14/2021 1019   VLDL 20 03/31/2018 0539   LDLCALC 122 (H) 12/14/2021 1019   The ASCVD Risk score (Arnett DK, et al., 2019) failed to calculate for the following reasons:   The patient has a prior MI or stroke diagnosis  Asthma:  -Asthma status: stable -Current Treatments: albuterol PRN  -Satisfied with current treatment?: yes -Current upper respiratory symptoms: yes -has an ongoing productive cough for the last 2 weeks, appears to be worsening.  Denies fevers. -Triggers: pollen -Visits to ER or Urgent Care in past year: no -Influenza: Up to Date  Health Maintenance: -Blood work UTD -Mammogram 1/24 Birads-1  Past Medical History:  Diagnosis Date   Anemia  vitamin d deficiency   Arthritis    Asthma    WELL CONTROLLED   Cancer of ear    skin cancer left ear   COPD (chronic obstructive pulmonary disease) (HCC)    Diabetes mellitus without complication ([place])    Fatty liver    Hypertension    Kidney cysts    per patient, never had   Renal disorder    Sleep apnea    DOES NOT USE CPAP. waiting for new machine and a new sleep study   Stroke [facility]) May or June 2019   TIA. no residual symptoms    Past Surgical History:  Procedure Laterality Date   ABDOMINAL HYSTERECTOMY     ANTERIOR CERVICAL DECOMP/DISCECTOMY FUSION N/A 08/09/2018   Procedure: ANTERIOR CERVICAL DECOMPRESSION/DISCECTOMY FUSION 1 LEVEL- C4-5;  Surgeon: Meade Maw, MD;  Location: [facility] ORS;  Service: Neurosurgery;  Laterality: N/A;   BACK SURGERY     NECK   COLONOSCOPY WITH PROPOFOL N/A 02/26/2021   Procedure: COLONOSCOPY WITH PROPOFOL;  Surgeon: Jonathon Bellows, MD;  Location: [facility] ENDOSCOPY;  Service: Endoscopy;  Laterality: N/A;   DILATATION & CURETTAGE/HYSTEROSCOPY WITH MYOSURE N/A 01/19/2021   Procedure: DILATATION & CURETTAGE/HYSTEROSCOPY;  Surgeon: Rubie Maid, MD;  Location: [facility] ORS;  Service: Gynecology;  Laterality: N/A;   DILATION AND CURETTAGE OF UTERUS     ENDOMETRIAL ABLATION N/A 01/19/2021   Procedure: ENDOMETRIAL ABLATION, MINERVA;  Surgeon: Rubie Maid, MD;  Location: [facility] OR[facility];  Service: Gynecology;  Laterality: N/A;   ENDOMETRIAL BIOPSY     benign   ESOPHAGOGASTRODUODENOSCOPY (EGD) WITH PROPOFOL N/A 08/24/2022   Procedure: ESOPHAGOGASTRODUODENOSCOPY (EGD) WITH PROPOFOL;  Surgeon: Lin Landsman, MD;  Location: [facility];  Service: Gastroenterology;  Laterality: N/A;   EXPLORATORY LAPAROTOMY  1992   REMOVAL OF RUPTURED ECTOPIC   HAND SURGERY Right 1998   cyst removed   HERNIA REPAIR  [id number]   UMBILICAL   JOINT REPLACEMENT Right 2014   TKR   KNEE ARTHROSCOPY Right 2012   KNEE SURGERY Right 2014   total knee replacement   ROBOTIC ASSISTED LAPAROSCOPIC HYSTERECTOMY AND SALPINGECTOMY Bilateral 03/15/2022   Procedure: XI ROBOTIC ASSISTED LAPAROSCOPIC HYSTERECTOMY;  Surgeon: Rubie Maid, MD;  Location: [facility] ORS;  Service: Gynecology;  Laterality: Bilateral;   SHOULDER ARTHROSCOPY WITH BICEPSTENOTOMY Left 12/14/2016   Procedure: SHOULDER ARTHROSCOPY WITH BICEPSTENOTOMY;  Surgeon: Corky Mull, MD;  Location: [facility];  Service: Orthopedics;  Laterality: Left;   SHOULDER ARTHROSCOPY WITH OPEN ROTATOR CUFF REPAIR Left 12/14/2016   Procedure: SHOULDER ARTHROSCOPY WITH OPEN ROTATOR CUFF REPAIR AND ARTHROSCOPIC ROTATOR CUFF REPAIR;  Surgeon: Corky Mull, MD;  Location: [facility];  Service: Orthopedics;  Laterality: Left;    SHOULDER ARTHROSCOPY WITH ROTATOR CUFF REPAIR Right 01/04/2019   Procedure: SHOULDER ARTHROSCOPY WITH ROTATOR CUFF REPAIR;  Surgeon: Corky Mull, MD;  Location: [facility];  Service: Orthopedics;  Laterality: Right;   SHOULDER ARTHROSCOPY WITH SUBACROMIAL DECOMPRESSION Left 12/14/2016   Procedure: SHOULDER ARTHROSCOPY WITH SUBACROMIAL DECOMPRESSION;  Surgeon: Corky Mull, MD;  Location: [facility];  Service: Orthopedics;  Laterality: Left;   SHOULDER ARTHROSCOPY WITH SUBACROMIAL DECOMPRESSION AND BICEP TENDON REPAIR Right 01/04/2019   Procedure: SHOULDER ARTHROSCOPY WITH DEBRIDEMENT AND SUBACROMIAL DECOMPRESSION-RIGHT;  Surgeon: Corky Mull, MD;  Location: [facility];  Service: Orthopedics;  Laterality: Right;   TUBAL LIGATION      Family History  Problem Relation Age of Onset   Hypertension Mother    Thyroid disease Mother    Lupus Mother  Congestive Heart Failure Mother    Hypertension Brother    CAD Maternal Grandmother    Breast cancer Neg Hx    Ovarian cancer Neg Hx    Colon cancer Neg Hx     Social History   Socioeconomic History   Marital status: Married    Spouse name: brian   Number of children: 7   Years of education: Not on file   Highest education level: Not on file  Occupational History   Occupation: disabled    Comment: disabled  Tobacco Use   Smoking status: Former    Packs/day: 2.00    Years: 25.00    Additional pack years: 0.00    Total pack years: 50.00    Types: Cigarettes    Quit date: 06/09/2017    Years since quitting: 5.6   Smokeless tobacco: Never  Vaping Use   Vaping Use: Never used  Substance and Sexual Activity   Alcohol use: No   Drug use: Yes    Frequency: 7.0 times per week    Types: Marijuana    Comment: pt states as needed when she is hurting real bad   Sexual activity: Yes    Partners: Male    Birth control/protection: None  Other Topics Concern   Not on file  Social History Narrative   Have 6 biological childrean and custody  of niece since 23 months.   Social Determinants of Health   Financial Resource Strain: Low Risk  (08/07/2019)   Overall Financial Resource Strain (CARDIA)    Difficulty of Paying Living Expenses: Not hard at all  Food Insecurity: No Food Insecurity (08/07/2019)   Hunger Vital Sign    Worried About Running Out of Food in the Last Year: Never true    Ran Out of Food in the Last Year: Never true  Transportation Needs: No Transportation Needs (08/07/2019)   PRAPARE - Hydrologist (Medical): No    Lack of Transportation (Non-Medical): No  Physical Activity: Inactive (08/07/2019)   Exercise Vital Sign    Days of Exercise per Week: 0 days    Minutes of Exercise per Session: 0 min  Stress: No Stress Concern Present (08/07/2019)   [place] of Stress : Not at all  Social Connections: [place] (08/07/2019)   Social Connection and Isolation Panel [NHANES]    Frequency of Communication with Friends and Family: More than three times a week    Frequency of Social Gatherings with Friends and Family: Never    Attends Religious Services: More than 4 times per year    Active Member of [employer] or Organizations: Yes    Attends Music therapist: More than 4 times per year    Marital Status: Married  Human resources officer Violence: Not At Risk (08/07/2019)   Humiliation, Afraid, Rape, and Kick questionnaire    Fear of Current or Ex-Partner: No    Emotionally Abused: No    Physically Abused: No    Sexually Abused: No    Outpatient Medications Prior to Visit  Medication Sig Dispense Refill   albuterol (VENTOLIN HFA) 108 (90 Base) MCG/ACT inhaler Inhale 2 puffs into the lungs every 6 (six) hours as needed for wheezing or shortness of breath. 18 g 3   atorvastatin (LIPITOR) 40 MG tablet Take 1 tablet (40 mg total) by mouth daily. 90 tablet 3   Continuous Blood Gluc Sensor (FREESTYLE  LIBRE 3  SENSOR) MISC 1 each by Does not apply route 4 (four) times daily. Place 1 sensor on the skin every 14 days. Use to check glucose continuously 2 each 3   docusate sodium (COLACE) 100 MG capsule Take 1 capsule (100 mg total) by mouth 2 (two) times daily as needed. 30 capsule 2   Dulaglutide (TRULICITY) 3 0000000 SOPN Inject 3 mg as directed once a week. 2 mL 2   gabapentin (NEURONTIN) 300 MG capsule Take 1 capsule (300 mg total) by mouth 3 (three) times daily. 90 capsule 1   insulin glargine (LANTUS) 100 UNIT/ML injection Inject 0.6 mLs (60 Units total) into the skin 2 (two) times daily. 10 mL 9   insulin lispro (HUMALOG) 100 UNIT/ML injection Inject 0.3 mLs (30 Units total) into the skin 3 (three) times daily before meals. 10 mL 11   Insulin Syringe-Needle U-100 (INSULIN SYRINGE 1CC/31GX5/16") 31G X 5/16" 1 ML MISC Use 4 times daily as directed. 100 each 3   ketorolac (TORADOL) 10 MG tablet Take 1 tablet (10 mg total) by mouth every 8 (eight) hours as needed for severe pain. 20 tablet 0   lisinopril (ZESTRIL) 40 MG tablet Take 1 tablet (40 mg total) by mouth at bedtime. 90 tablet 1   omeprazole (PRILOSEC) 40 MG capsule Take 1 capsule (40 mg total) by mouth daily before breakfast. 30 capsule 1   sertraline (ZOLOFT) 25 MG tablet Take 1 tablet (25 mg total) by mouth daily. 90 tablet 0   tiZANidine (ZANAFLEX) 4 MG tablet Take 1 tablet (4 mg total) by mouth every 6 (six) hours as needed for muscle spasms. 30 tablet 0   No facility-administered medications prior to visit.    Allergies  Allergen Reactions   Dilaudid [Hydromorphone Hcl] Nausea And Vomiting   Cymbalta [Duloxetine Hcl] Nausea And Vomiting   Morphine And Related Nausea And Vomiting   Tramadol Nausea Only    If she takes antinausea medicine with this medicine, then she can tolerate it.    ROS Review of Systems  Constitutional:  Negative for chills and fever.  Eyes:  Negative for visual disturbance.  Respiratory:  Negative  for shortness of breath.   Cardiovascular:  Negative for chest pain.  Musculoskeletal:  Positive for back pain.  Psychiatric/Behavioral:  Positive for sleep disturbance.       Objective:    Physical Exam Constitutional:      Appearance: Normal appearance. She is obese.  HENT:     Head: Normocephalic and atraumatic.  Eyes:     Conjunctiva/sclera: Conjunctivae normal.  Cardiovascular:     Rate and Rhythm: Normal rate and regular rhythm.  Pulmonary:     Effort: Pulmonary effort is normal.     Breath sounds: Normal breath sounds.  Musculoskeletal:     Right lower leg: No edema.     Left lower leg: No edema.  Skin:    General: Skin is warm and dry.  Neurological:     General: No focal deficit present.     Mental Status: She is alert. Mental status is at baseline.  Psychiatric:        Mood and Affect: Mood normal.        Behavior: Behavior normal.     LMP 02/22/2022 (Approximate)  Wt Readings from Last 3 Encounters:  02/15/23 (!) 321 lb (145.6 kg)  02/07/23 (!) 321 lb 6.4 oz (145.8 kg)  12/16/22 (!) 324 lb 11.2 oz (147.3 kg)     Health Maintenance Due  Topic  Date Due   Lung Cancer Screening  01/29/2023    There are no preventive care reminders to display for this patient.  Lab Results  Component Value Date   TSH 1.614 03/15/2022   Lab Results  Component Value Date   WBC 5.7 12/15/2022   HGB 13.7 12/15/2022   HCT 44.0 12/15/2022   MCV 95.0 12/15/2022   PLT 233 12/15/2022   Lab Results  Component Value Date   NA 138 12/15/2022   K 3.7 12/15/2022   CO2 28 12/15/2022   GLUCOSE 78 12/15/2022   BUN 14 12/15/2022   CREATININE 0.82 12/15/2022   BILITOT 0.4 12/15/2022   ALKPHOS 77 12/15/2022   AST 21 12/15/2022   ALT 18 12/15/2022   PROT 7.8 12/15/2022   ALBUMIN 4.0 12/15/2022   CALCIUM 8.8 (L) 12/15/2022   ANIONGAP 7 12/15/2022   EGFR 79 01/18/2022   Lab Results  Component Value Date   CHOL 185 12/14/2021   Lab Results  Component Value Date    HDL 41 (L) 12/14/2021   Lab Results  Component Value Date   LDLCALC 122 (H) 12/14/2021   Lab Results  Component Value Date   TRIG 109 12/14/2021   Lab Results  Component Value Date   CHOLHDL 4.5 12/14/2021   Lab Results  Component Value Date   HGBA1C 10.0 (A) 02/07/2023      Assessment & Plan:   1. Type 2 diabetes mellitus with hyperglycemia, with long-term current use of insulin ([place]): A1c increased further to 10.0%, uncontrolled. Patient changed her insulin without telling me and has not been able to get Trulicity filled although it is on the Medicaid preferred list and pharmacy has it in stock currently. Increase Trulicity to 3 mg weekly. Change Lantus to 30 units twice a day, continue Humalog 30 units with meals. Recheck in 3 months.   - POCT HgB A1C - Dulaglutide (TRULICITY) 3 0000000 SOPN; Inject 3 mg as directed once a week.  Dispense: 2 mL; Refill: 2 - insulin glargine (LANTUS) 100 UNIT/ML injection; Inject 0.6 mLs (60 Units total) into the skin 2 (two) times daily.  Dispense: 10 mL; Refill: 9  2. Essential hypertension: Stable, continue Lisinopril 40 mg.   3. Episode of recurrent major depressive disorder, unspecified depression episode severity ([place]): Discontinue Cymbalta due to side effects. Patient has been on Trazodone and Doxepin in the past for sleep but states they don't work for her but Ambien is too risky with her current medications. Will start Zoloft 25 mg to take daily, patient's daughter is on it and doing well.   - sertraline (ZOLOFT) 25 MG tablet; Take 1 tablet (25 mg total) by mouth daily.  Dispense: 90 tablet; Refill: 0   Follow-up: No follow-ups on file.    [name], DO

## 2023-02-17 ENCOUNTER — Encounter: Payer: Self-pay | Admitting: Internal Medicine

## 2023-02-17 ENCOUNTER — Ambulatory Visit (INDEPENDENT_AMBULATORY_CARE_PROVIDER_SITE_OTHER): Payer: Medicaid Other | Admitting: Internal Medicine

## 2023-02-17 VITALS — BP 132/86 | HR 96 | Temp 97.7°F | Resp 18 | Ht 66.0 in | Wt 335.7 lb

## 2023-02-17 DIAGNOSIS — E162 Hypoglycemia, unspecified: Secondary | ICD-10-CM

## 2023-02-17 DIAGNOSIS — E1165 Type 2 diabetes mellitus with hyperglycemia: Secondary | ICD-10-CM

## 2023-02-17 DIAGNOSIS — R600 Localized edema: Secondary | ICD-10-CM

## 2023-02-17 DIAGNOSIS — Z794 Long term (current) use of insulin: Secondary | ICD-10-CM | POA: Diagnosis not present

## 2023-02-17 MED ORDER — OZEMPIC (0.25 OR 0.5 MG/DOSE) 2 MG/3ML ~~LOC~~ SOPN: 0.5000 mg | PEN_INJECTOR | SUBCUTANEOUS | 3 refills | Status: DC

## 2023-02-17 MED ORDER — ACCU-CHEK GUIDE VI STRP: ORAL_STRIP | 12 refills | Status: DC

## 2023-02-17 MED ORDER — GVOKE HYPOPEN 1-PACK 1 MG/0.2ML ~~LOC~~ SOAJ: 1.0000 mg | SUBCUTANEOUS | 2 refills | Status: DC | PRN

## 2023-02-17 NOTE — Telephone Encounter (Signed)
Copied from [place]. Topic: General - Other >> Feb 17, 2023  4:11 PM Dominique A wrote: Reason for CRM: [place] is calling in regards to the prescription that was sent over for Accu Check Guide test strips. They are needing to know how many times a day the pt need to test. Please call the pharmacy back.

## 2023-02-17 NOTE — Patient Instructions (Addendum)
It was great seeing you today!  Plan discussed at today's visit: -Change insulin: Lantus stay the same in the morning at 30 units, decrease Lantus at night to 20 units and decrease Humalog to 10 units with morning meal and 20 units with evening meal -Add Ozempic 0.5 mg weekly  -When blood sugar is low, be sure to eat something with sugar and carbs - Glucagon pen ordered to pharmacy as well - use as needed for low sugar    Follow up in: 1 month   Take care and let us know if you have any questions or concerns prior to your next visit.  Dr. Rosana Berger

## 2023-02-18 ENCOUNTER — Other Ambulatory Visit: Payer: Self-pay

## 2023-02-18 ENCOUNTER — Encounter: Payer: Self-pay | Admitting: Internal Medicine

## 2023-02-18 ENCOUNTER — Ambulatory Visit: Admit: 2023-02-18 | Payer: No Typology Code available for payment source | Admitting: Surgery

## 2023-02-18 DIAGNOSIS — E1165 Type 2 diabetes mellitus with hyperglycemia: Secondary | ICD-10-CM

## 2023-02-18 SURGERY — ARTHROPLASTY, KNEE, TOTAL
Anesthesia: Choice | Site: Knee | Laterality: Left

## 2023-02-18 MED ORDER — ACCU-CHEK GUIDE VI STRP: ORAL_STRIP | 12 refills | Status: AC

## 2023-02-21 ENCOUNTER — Emergency Department
Admission: EM | Admit: 2023-02-21 | Discharge: 2023-02-22 | Disposition: A | Discharge status: Home / Self Care | Payer: Medicaid Other | Attending: Emergency Medicine | Admitting: Emergency Medicine

## 2023-02-21 ENCOUNTER — Other Ambulatory Visit: Payer: Self-pay

## 2023-02-21 ENCOUNTER — Encounter: Payer: Self-pay | Admitting: Internal Medicine

## 2023-02-21 ENCOUNTER — Emergency Department: Payer: Medicaid Other

## 2023-02-21 DIAGNOSIS — Z794 Long term (current) use of insulin: Secondary | ICD-10-CM | POA: Diagnosis not present

## 2023-02-21 DIAGNOSIS — E162 Hypoglycemia, unspecified: Secondary | ICD-10-CM

## 2023-02-21 DIAGNOSIS — E11649 Type 2 diabetes mellitus with hypoglycemia without coma: Secondary | ICD-10-CM | POA: Insufficient documentation

## 2023-02-21 DIAGNOSIS — I10 Essential (primary) hypertension: Secondary | ICD-10-CM | POA: Diagnosis not present

## 2023-02-21 LAB — CBC WITH DIFFERENTIAL/PLATELET
Abs Immature Granulocytes: 0.02 10*3/uL (ref 0.00–0.07)
Basophils Absolute: 0 10*3/uL (ref 0.0–0.1)
Basophils Relative: 0 %
Eosinophils Absolute: 0.1 10*3/uL (ref 0.0–0.5)
Eosinophils Relative: 1 %
HCT: 41.4 % (ref 36.0–46.0)
Hemoglobin: 12.7 g/dL (ref 12.0–15.0)
Immature Granulocytes: 0 %
Lymphocytes Relative: 27 %
Lymphs Abs: 1.9 10*3/uL (ref 0.7–4.0)
MCH: 29.3 pg (ref 26.0–34.0)
MCHC: 30.7 g/dL (ref 30.0–36.0)
MCV: 95.4 fL (ref 80.0–100.0)
Monocytes Absolute: 0.5 10*3/uL (ref 0.1–1.0)
Monocytes Relative: 7 %
Neutro Abs: 4.6 10*3/uL (ref 1.7–7.7)
Neutrophils Relative %: 65 %
Platelets: 243 10*3/uL (ref 150–400)
RBC: 4.34 MIL/uL (ref 3.87–5.11)
RDW: 13.6 % (ref 11.5–15.5)
WBC: 7.2 10*3/uL (ref 4.0–10.5)
nRBC: 0 % (ref 0.0–0.2)

## 2023-02-21 LAB — URINALYSIS, ROUTINE W REFLEX MICROSCOPIC
Bilirubin Urine: NEGATIVE
Glucose, UA: NEGATIVE mg/dL
Hgb urine dipstick: NEGATIVE
Ketones, ur: NEGATIVE mg/dL
Leukocytes,Ua: NEGATIVE
Nitrite: NEGATIVE
Protein, ur: 30 mg/dL — AB
Specific Gravity, Urine: 1.01 (ref 1.005–1.030)
pH: 7 (ref 5.0–8.0)

## 2023-02-21 LAB — COMPREHENSIVE METABOLIC PANEL
ALT: 15 U/L (ref 0–44)
AST: 22 U/L (ref 15–41)
Albumin: 3.8 g/dL (ref 3.5–5.0)
Alkaline Phosphatase: 74 U/L (ref 38–126)
Anion gap: 9 (ref 5–15)
BUN: 21 mg/dL — ABNORMAL HIGH (ref 6–20)
CO2: 27 mmol/L (ref 22–32)
Calcium: 8.8 mg/dL — ABNORMAL LOW (ref 8.9–10.3)
Chloride: 101 mmol/L (ref 98–111)
Creatinine, Ser: 1.05 mg/dL — ABNORMAL HIGH (ref 0.44–1.00)
GFR, Estimated: >60 mL/min (ref >60)
Glucose, Bld: 120 mg/dL — ABNORMAL HIGH (ref 70–99)
Potassium: 4 mmol/L (ref 3.5–5.1)
Sodium: 137 mmol/L (ref 135–145)
Total Bilirubin: 0.9 mg/dL (ref 0.3–1.2)
Total Protein: 7.4 g/dL (ref 6.5–8.1)

## 2023-02-21 LAB — CBG MONITORING, ED: Glucose-Capillary: 112 mg/dL — ABNORMAL HIGH (ref 70–99)

## 2023-02-21 MED ORDER — ACETAMINOPHEN 500 MG PO TABS
1000.0000 mg | ORAL_TABLET | Freq: Once | ORAL | Status: AC
  Administered 2023-02-22: 1000 mg via ORAL
  Filled 2023-02-21: qty 2

## 2023-02-21 NOTE — ED Triage Notes (Signed)
Pt to ED via [facility] for c/o low blood sugar. Pt reports taking insulin at 6 pm. Per pt, glucose dropped to 59. Pt ate two sandwiches attempting to get it up, but could not get it to stay up. Glucose 99 with EMS. Pt has hx of diabetes, HTN.  BP 178/99 HR 99 94% RA

## 2023-02-21 NOTE — ED Provider Notes (Signed)
   [facility] Provider Note    Event Date/Time   First MD Initiated Contact with Patient 02/21/23 2257     (approximate)   History   No chief complaint on file.   HPI  Brenda Hicks is a 57 y.o. female who presents to the ED for evaluation of No chief complaint on file.   I reviewed PCP visit from 3/28.  Morbidly obese patient with history of chronic leg swelling, HTN and diabetes.  She is on Lantus 60 units and Humalog .  She was complaining of intermittent hypoglycemia at this PCP visit despite her uncontrolled A1c and so Lantus was decreased.  She was started on Ozempic as well alongside the decrease Lantus dosing.  Sounds she has been trying to change her diet to a more healthy.   Physical Exam   Triage Vital Signs: ED Triage Vitals  Enc Vitals Group     BP      Pulse      Resp      Temp      Temp src      SpO2      Weight      Height      Head Circumference      Peak Flow      Pain Score      Pain Loc      Pain Edu?      Excl. in [place]?     Most recent vital signs: There were no vitals filed for this visit.  General: Awake, no distress. *** CV:  Good peripheral perfusion.  Resp:  Normal effort.  Abd:  No distention.  MSK:  No deformity noted.  Neuro:  No focal deficits appreciated. Other:     ED Results / Procedures / Treatments   Labs (all labs ordered are listed, but only abnormal results are displayed) Labs Reviewed - No data to display  EKG ***  RADIOLOGY ***  Official radiology report(s): No results found.  PROCEDURES and INTERVENTIONS:  Procedures  Medications - No data to display   IMPRESSION / MDM / [place] / ED COURSE  I reviewed the triage vital signs and the nursing notes.  Differential diagnosis includes, but is not limited to, ***  {Patient presents with symptoms of an acute illness or injury that is potentially life-threatening.}      FINAL CLINICAL IMPRESSION(S) / ED DIAGNOSES    Final diagnoses:  None     Rx / DC Orders   ED Discharge Orders     None        Note:  This document was prepared using Dragon voice recognition software and may include unintentional dictation errors.

## 2023-02-22 ENCOUNTER — Encounter: Payer: Self-pay | Admitting: Internal Medicine

## 2023-02-22 LAB — CBG MONITORING, ED: Glucose-Capillary: 155 mg/dL — ABNORMAL HIGH (ref 70–99)

## 2023-02-22 NOTE — Discharge Instructions (Signed)
Try cutting back on your Lantus to 20 units in the morning and 20 units at night.  Reach back out to your PCP to discuss this regimen  If you have further episodes of low sugars, he may need to cut back further on your Lantus.

## 2023-02-24 ENCOUNTER — Ambulatory Visit: Payer: Medicaid Other | Admitting: Internal Medicine

## 2023-02-25 ENCOUNTER — Other Ambulatory Visit: Payer: Self-pay | Admitting: Internal Medicine

## 2023-02-25 DIAGNOSIS — E1165 Type 2 diabetes mellitus with hyperglycemia: Secondary | ICD-10-CM

## 2023-03-03 ENCOUNTER — Ambulatory Visit: Payer: Medicaid Other | Attending: Orthopedic Surgery

## 2023-03-07 ENCOUNTER — Other Ambulatory Visit: Payer: Self-pay

## 2023-03-07 ENCOUNTER — Emergency Department
Admission: EM | Admit: 2023-03-07 | Discharge: 2023-03-07 | Disposition: A | Discharge status: Home / Self Care | Payer: Medicaid Other | Attending: Emergency Medicine | Admitting: Emergency Medicine

## 2023-03-07 ENCOUNTER — Emergency Department: Payer: Medicaid Other

## 2023-03-07 DIAGNOSIS — M25512 Pain in left shoulder: Secondary | ICD-10-CM | POA: Insufficient documentation

## 2023-03-07 DIAGNOSIS — Z87891 Personal history of nicotine dependence: Secondary | ICD-10-CM | POA: Insufficient documentation

## 2023-03-07 DIAGNOSIS — I10 Essential (primary) hypertension: Secondary | ICD-10-CM | POA: Diagnosis not present

## 2023-03-07 DIAGNOSIS — E1142 Type 2 diabetes mellitus with diabetic polyneuropathy: Secondary | ICD-10-CM | POA: Diagnosis not present

## 2023-03-07 MED ORDER — KETOROLAC TROMETHAMINE 15 MG/ML IJ SOLN
15.0000 mg | Freq: Once | INTRAMUSCULAR | Status: AC
  Administered 2023-03-07: 15 mg via INTRAMUSCULAR
  Filled 2023-03-07: qty 1

## 2023-03-07 MED ORDER — LIDOCAINE 5 % EX PTCH
1.0000 | MEDICATED_PATCH | CUTANEOUS | Status: DC
  Administered 2023-03-07: 1 via TRANSDERMAL
  Filled 2023-03-07: qty 1

## 2023-03-07 MED ORDER — ACETAMINOPHEN 325 MG PO TABS
650.0000 mg | ORAL_TABLET | Freq: Once | ORAL | Status: AC
  Administered 2023-03-07: 650 mg via ORAL
  Filled 2023-03-07: qty 2

## 2023-03-07 NOTE — ED Provider Notes (Signed)
[facility] Provider Note    Event Date/Time   First MD Initiated Contact with Patient 03/07/23 [phone]     (approximate)   History   Arm Pain   HPI  Brenda Hicks is a 57 y.o. female with a past medical history of bilateral rotator cuff repair who presents today for evaluation of left shoulder pain.  Patient reports that her symptoms began a couple of days ago while she was folding laundry.  She reports that she leaned over the table and caught herself with her left arm and felt a pop in her left shoulder.  She reports that she has continued to have pain in her shoulder, radiating down to her elbow and also up to her neck.  She reports that she has pain with any movement of her shoulder.  No chest pain or shortness of breath.  No weakness or paresthesias.  She reports that she took Aleve with limited improvement of her symptoms.  Patient Active Problem List   Diagnosis Date Noted   Chronic GERD    Gastric erosion    Abnormal perimenopausal bleeding 03/15/2022   S/P laparoscopic hysterectomy 03/15/2022   Mechanical failure of prosthetic joint 11/03/2021   Joint pain 11/03/2021   Osteoarthritis of left knee 10/08/2021   Pain due to onychomycosis of toenails of both feet 03/05/2021   Plantar fasciitis, bilateral 03/05/2021   Frequent bowel movements 04/25/2020   Abnormal Pap smear 03/31/2020   Arthritis 03/31/2020   Sprain of collateral ligament of right knee 03/10/2020   Leg swelling 02/08/2020   S/P cervical spinal fusion 09/24/2019   Hyperlipidemia associated with type 2 diabetes mellitus 08/08/2019   Low back pain radiating to left lower extremity 08/07/2019   Nontraumatic incomplete tear of right rotator cuff 01/04/2019   Tendinitis of upper biceps tendon of right shoulder 01/04/2019   Rotator cuff tendinitis, right 12/01/2018   Cervical myelopathy 08/09/2018   DJD (degenerative joint disease) of cervical spine 08/04/2018   Dissection of vertebral  artery 08/03/2018   History of ischemic stroke 03/30/2018   Ischemic chest pain 03/29/2018   Chest pain 03/29/2018   Sebaceous cyst 06/01/2016   Obstructive apnea 05/31/2016   Acid reflux 05/31/2016   Essential hypertension 05/31/2016   Abnormal Pap smear of cervix 05/31/2016   Arthritis of knee, degenerative 07/25/2015   History of artificial joint 07/25/2015   Gonalgia 02/17/2015   Type 2 diabetes mellitus 10/23/2014   Mixed conductive and sensorineural hearing loss, 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/2014   Diabetic polyneuropathy associated with type 2 diabetes mellitus 05/16/2014   Endometrial polyp 11/01/2013   Fibroids, intramural 10/09/2013   Pain due to knee joint prosthesis 10/05/2013   Body mass index (BMI) of 50-59.9 in adult 10/03/2013   Abnormal uterine bleeding 10/03/2013   Morbid obesity 10/01/2013   Excessive and frequent menstruation with irregular cycle 10/01/2013   Adaptive colitis 10/01/2013   History of migraine headaches 10/01/2013   H/O malignant neoplasm of skin 10/01/2013   Fatty liver disease, nonalcoholic 10/01/2013   Diverticulitis 10/01/2013   Former smoker 08/29/2013   H/O total knee replacement 08/29/2013   Insomnia 08/29/2013   Dysmenorrhea 08/29/2013   Airway hyperreactivity 08/29/2013   Absolute anemia 08/29/2013          Physical Exam   Triage Vital Signs: ED Triage Vitals  Enc Vitals Group     BP 03/07/23 0933 (!) 163/94     Pulse Rate 03/07/23 0933  78     Resp 03/07/23 0933 16     Temp 03/07/23 0933 98.4 F (36.9 C)     Temp Source 03/07/23 0933 Oral     SpO2 03/07/23 0933 99 %     Weight --      Height --      Head Circumference --      Peak Flow --      Pain Score 03/07/23 0932 10     Pain Loc --      Pain Edu? --      Excl. in GC? --     Most recent vital signs: Vitals:   03/07/23 0933  BP: (!) 163/94  Pulse: 78  Resp: 16  Temp: 98.4 F (36.9 C)  SpO2: 99%    Physical  Exam Vitals and nursing note reviewed.  Constitutional:      General: Awake and alert. No acute distress.    Appearance: Normal appearance. The patient is obese.  HENT:     Head: Normocephalic and atraumatic.     Mouth: Mucous membranes are moist.  Eyes:     General: PERRL. Normal EOMs        Right eye: No discharge.        Left eye: No discharge.     Conjunctiva/sclera: Conjunctivae normal.  Cardiovascular:     Rate and Rhythm: Normal rate and regular rhythm.     Pulses: Normal pulses.  Pulmonary:     Effort: Pulmonary effort is normal. No respiratory distress.     Breath sounds: Normal breath sounds.  Abdominal:     Abdomen is soft. There is no abdominal tenderness. No rebound or guarding. No distention. Musculoskeletal:        General: No swelling. Normal range of motion.     Cervical back: Normal range of motion and neck supple.  Negative Spurling and Lhermitte sign, mildly tender to palpation to the left cervical paraspinal muscles Left shoulder: no obvious deformity, swelling, ecchymosis, or erythema No clavicular or AC joint tenderness Able to actively and passively forward flex and abduct to 90 degrees and then has significant pain with further movement, negative drop arm test Positive discomfort with Obriens, SLAP, empty can, and lift off tests Normal internal and external rotation against resistance Discomfort with Hawkins and Neers Normal ROM at elbow and wrist Normal resisted pronation and supination 2+ radial pulse Normal grip strength Normal intrinsic hand muscle function Skin:    General: Skin is warm and dry.     Capillary Refill: Capillary refill takes less than 2 seconds.     Findings: No rash.  Neurological:     Mental Status: The patient is awake and alert.      ED Results / Procedures / Treatments   Labs (all labs ordered are listed, but only abnormal results are displayed) Labs Reviewed - No data to display   EKG     RADIOLOGY I  independently reviewed and interpreted imaging and agree with radiologists findings.     PROCEDURES:  Critical Care performed:   Procedures   MEDICATIONS ORDERED IN ED: Medications  lidocaine (LIDODERM) 5 % 1 patch (1 patch Transdermal Patch Applied 03/07/23 1019)  ketorolac (TORADOL) 15 MG/ML injection 15 mg (15 mg Intramuscular Given 03/07/23 1019)  acetaminophen (TYLENOL) tablet 650 mg (650 mg Oral Given 03/07/23 1019)     IMPRESSION / MDM / ASSESSMENT AND PLAN / ED COURSE  I reviewed the triage vital signs and the nursing notes.  Differential diagnosis includes, but is not limited to, rotator cuff injury, sprain, strain, less likely fracture or dislocation or cervical etiology.  Patient is awake and alert, hemodynamically stable and neurovascularly intact.  She has normal sensation over her deltoid and throughout her arm, equal to opposite.  She has limited range of motion of her shoulder secondary to pain, though she has negative drop arm test, I do not suspect a complete rotator cuff tear.  She specifically has pain with SLAP test, and will not attempt liftoff test.  She also reports pain in her neck, which is likely muscle involvement from her holding up her shoulder.  Normal strength and sensation of bilateral upper extremities, normal intrinsic muscle function of the hand, not consistent with central cord syndrome.  No radicular type symptoms.  X-rays were obtained and reveal degenerative changes without acute osseous injury.  She was placed in a sling for comfort.  We discussed the importance of performing gentle range of motion exercises to help prevent adhesive capsulitis.  We discussed return precautions and the importance of close outpatient follow-up with orthopedics as she has planned already.  Patient understands and agrees with plan.  She was discharged in stable condition.   Patient's presentation is most consistent with acute complicated illness / injury requiring  diagnostic workup.    FINAL CLINICAL IMPRESSION(S) / ED DIAGNOSES   Final diagnoses:  Acute pain of left shoulder     Rx / DC Orders   ED Discharge Orders     None        Note:  This document was prepared using Dragon voice recognition software and may include unintentional dictation errors.   Keturah Shavers 03/07/23 1044    Georga Hacking, MD 03/07/23 [phone]

## 2023-03-07 NOTE — ED Triage Notes (Signed)
Pt comes with c/o left arm pain. Pt states hx of rotator cuff surgery in past. Pt states she  thinks it might be it again. No relief with OTC meds

## 2023-03-07 NOTE — Discharge Instructions (Signed)
Please follow-up with your orthopedist as we discussed.  You may wear the sling for extra support, though please remember to remove your arm from the sling 4 or 5 times daily to perform the gentle range of motion exercises to help prevent frozen shoulder.  Please return for any new, worsening, or change in symptoms or other concerns.  It was a pleasure caring for you today.

## 2023-03-10 ENCOUNTER — Ambulatory Visit: Payer: Medicaid Other

## 2023-03-16 ENCOUNTER — Encounter: Payer: Self-pay | Admitting: Obstetrics and Gynecology

## 2023-03-16 ENCOUNTER — Other Ambulatory Visit (HOSPITAL_COMMUNITY)
Admission: RE | Admit: 2023-03-16 | Discharge: 2023-03-16 | Disposition: A | Discharge status: Home / Self Care | Payer: Medicaid Other | Source: Ambulatory Visit | Attending: Obstetrics and Gynecology | Admitting: Obstetrics and Gynecology

## 2023-03-16 ENCOUNTER — Ambulatory Visit: Payer: Medicaid Other | Admitting: Obstetrics and Gynecology

## 2023-03-16 ENCOUNTER — Ambulatory Visit (INDEPENDENT_AMBULATORY_CARE_PROVIDER_SITE_OTHER): Payer: Medicaid Other | Admitting: Obstetrics and Gynecology

## 2023-03-16 VITALS — BP 155/90 | HR 68 | Resp 16 | Ht 66.0 in | Wt 321.1 lb

## 2023-03-16 DIAGNOSIS — N941 Unspecified dyspareunia: Secondary | ICD-10-CM

## 2023-03-16 DIAGNOSIS — N761 Subacute and chronic vaginitis: Secondary | ICD-10-CM | POA: Diagnosis present

## 2023-03-16 MED ORDER — FLUCONAZOLE 150 MG PO TABS: 150.0000 mg | ORAL_TABLET | Freq: Once | ORAL | 3 refills | Status: AC

## 2023-03-16 NOTE — Progress Notes (Unsigned)
    GYNECOLOGY PROGRESS NOTE  Subjective:    Patient ID: Brenda Hicks, female    DOB: 11/30/1965, 57 y.o.   MRN: [id number]  HPI  Patient is a 57 y.o. [id number] female who presents for evaluation of pain with intercourse. She has had pain with intercourse for the past 2 months. Pain is sharp and then she has soreness for 1-2 days and then soreness resolves. Sometimes feels crampy. She has a yellowish discharge. Sometimes has itching.   The following portions of the patient's history were reviewed and updated as appropriate: allergies, current medications, past family history, past medical history, past social history, past surgical history, and problem list.  Review of Systems Pertinent items noted in HPI and remainder of comprehensive ROS otherwise negative.   Objective:   Resp. rate 16, height  (1.676 m), weight (!) 321 lb 1.6 oz (145.7 kg), last menstrual period 02/22/2022. Body mass index is 51.83 kg/m. General appearance: alert and no distress Abdomen: soft, non-tender; bowel sounds normal; no masses,  no organomegaly Pelvic: external genitalia normal, rectovaginal septum normal.  Vagina with small to moderate amount of thick white discharge, no odor.  Cervix normal appearing, no lesions and no motion tenderness.  Bimanual exam not performed.     Assessment:   1. Dyspareunia in female   2. Subacute vaginitis      Plan:   - Discussed likely cause of dyspareunia is vaginitis. Vaginal discharge with appearance of yeast. Offered prophylactic treatment vs waiting for vaginal cultures. Patient ok to treat prophylactically. Prefers pills to cream. Prescription for Diflucan sent. Will notify of results of culture via Mychart.    Hildred Laser, MD [facility]

## 2023-03-17 ENCOUNTER — Ambulatory Visit: Payer: Medicaid Other

## 2023-03-17 ENCOUNTER — Other Ambulatory Visit: Payer: Self-pay | Admitting: Student

## 2023-03-17 DIAGNOSIS — M25512 Pain in left shoulder: Secondary | ICD-10-CM

## 2023-03-17 LAB — CERVICOVAGINAL ANCILLARY ONLY
Bacterial Vaginitis (gardnerella): NEGATIVE
Candida Glabrata: NEGATIVE
Candida Vaginitis: NEGATIVE
Chlamydia: NEGATIVE
Comment: NEGATIVE
Comment: NEGATIVE
Comment: NEGATIVE
Comment: NEGATIVE
Comment: NEGATIVE
Comment: NORMAL
Neisseria Gonorrhea: NEGATIVE
Trichomonas: NEGATIVE

## 2023-03-22 NOTE — Progress Notes (Deleted)
Established Patient Office Visit  Subjective:  Patient ID: Brenda Hicks, female    DOB: 10-Dec-1965  Age: 57 y.o. MRN: [id number]  CC:  No chief complaint on file.   HPI Brenda Hicks presents for follow up on diabetes.   Hypertension, OSA: -Medications: Currently on Lisinopril 40 mg  -Patient is compliant with medications and reports no side effects.  -Checking BP at home: no -Denies any SOB, CP, LE edema, medication SEs, or symptoms of hypotension   Diabetes, Type 2 -Last A1c 10.0 3/24 -Medications: Lantus 30 units in the morning and 20 units at night, Humalog decreased to 10 units at breakfast, 10 units at lunch and 20 units at dinner. Ozempic 0.25 mg prescribed.  -Diet: trying to eat more healthy - mainly eats breakfast like toast and fruit and then a larger meal at dinner  -Exercise: None right now due to her back pain and knee pain. Has knee surgery scheduled -Blood sugars: going low, as low as 52 in the middle of the night. Was having some lows in the 70-80 range in the middle of the day -Eye exam: UTD 1/24 -Foot exam: UTD 9/23 -Microalbumin: UTD 12/23 on ACEI -Statin: yes -PNA vaccine: Prevnar 23 in 2014  Health Maintenance: -Blood work UTD -Mammogram 1/24 Birads-1  Past Medical History:  Diagnosis Date   Anemia    vitamin d deficiency   Arthritis    Asthma    WELL CONTROLLED   Cancer of ear    skin cancer left ear   COPD (chronic obstructive pulmonary disease) (HCC)    Diabetes mellitus without complication (HCC)    Fatty liver    Hypertension    Kidney cysts    per patient, never had   Renal disorder    Sleep apnea    DOES NOT USE CPAP. waiting for new machine and a new sleep study   Stroke [facility]) May or June 2019   TIA. no residual symptoms    Past Surgical History:  Procedure Laterality Date   ABDOMINAL HYSTERECTOMY     ANTERIOR CERVICAL DECOMP/DISCECTOMY FUSION N/A 08/09/2018   Procedure: ANTERIOR CERVICAL DECOMPRESSION/DISCECTOMY FUSION 1  LEVEL- C4-5;  Surgeon: Venetia Night, MD;  Location: [facility] ORS;  Service: Neurosurgery;  Laterality: N/A;   BACK SURGERY     NECK   COLONOSCOPY WITH PROPOFOL N/A 02/26/2021   Procedure: COLONOSCOPY WITH PROPOFOL;  Surgeon: Wyline Mood, MD;  Location: [facility] ENDOSCOPY;  Service: Endoscopy;  Laterality: N/A;   DILATATION & CURETTAGE/HYSTEROSCOPY WITH MYOSURE N/A 01/19/2021   Procedure: DILATATION & CURETTAGE/HYSTEROSCOPY;  Surgeon: Hildred Laser, MD;  Location: [facility] ORS;  Service: Gynecology;  Laterality: N/A;   DILATION AND CURETTAGE OF UTERUS     ENDOMETRIAL ABLATION N/A 01/19/2021   Procedure: ENDOMETRIAL ABLATION, MINERVA;  Surgeon: Hildred Laser, MD;  Location: [facility] ORS;  Service: Gynecology;  Laterality: N/A;   ENDOMETRIAL BIOPSY     benign   ESOPHAGOGASTRODUODENOSCOPY (EGD) WITH PROPOFOL N/A 08/24/2022   Procedure: ESOPHAGOGASTRODUODENOSCOPY (EGD) WITH PROPOFOL;  Surgeon: Toney Reil, MD;  Location: [facility] ENDOSCOPY;  Service: Gastroenterology;  Laterality: N/A;   EXPLORATORY LAPAROTOMY  1992   REMOVAL OF RUPTURED ECTOPIC   HAND SURGERY Right 1998   cyst removed   HERNIA REPAIR  1975   UMBILICAL   JOINT REPLACEMENT Right 2014   TKR   KNEE ARTHROSCOPY Right 2012   KNEE SURGERY Right 2014   total knee replacement   ROBOTIC ASSISTED LAPAROSCOPIC HYSTERECTOMY AND SALPINGECTOMY Bilateral 03/15/2022  Procedure: XI ROBOTIC ASSISTED LAPAROSCOPIC HYSTERECTOMY;  Surgeon: Hildred Laser, MD;  Location: [facility] ORS;  Service: Gynecology;  Laterality: Bilateral;   SHOULDER ARTHROSCOPY WITH BICEPSTENOTOMY Left 12/14/2016   Procedure: SHOULDER ARTHROSCOPY WITH BICEPSTENOTOMY;  Surgeon: Christena Flake, MD;  Location: [facility] ORS;  Service: Orthopedics;  Laterality: Left;   SHOULDER ARTHROSCOPY WITH OPEN ROTATOR CUFF REPAIR Left 12/14/2016   Procedure: SHOULDER ARTHROSCOPY WITH OPEN ROTATOR CUFF REPAIR AND ARTHROSCOPIC ROTATOR CUFF REPAIR;  Surgeon: Christena Flake, MD;  Location: [facility] ORS;  Service:  Orthopedics;  Laterality: Left;   SHOULDER ARTHROSCOPY WITH ROTATOR CUFF REPAIR Right 01/04/2019   Procedure: SHOULDER ARTHROSCOPY WITH ROTATOR CUFF REPAIR;  Surgeon: Christena Flake, MD;  Location: [facility] ORS;  Service: Orthopedics;  Laterality: Right;   SHOULDER ARTHROSCOPY WITH SUBACROMIAL DECOMPRESSION Left 12/14/2016   Procedure: SHOULDER ARTHROSCOPY WITH SUBACROMIAL DECOMPRESSION;  Surgeon: Christena Flake, MD;  Location: [facility] ORS;  Service: Orthopedics;  Laterality: Left;   SHOULDER ARTHROSCOPY WITH SUBACROMIAL DECOMPRESSION AND BICEP TENDON REPAIR Right 01/04/2019   Procedure: SHOULDER ARTHROSCOPY WITH DEBRIDEMENT AND SUBACROMIAL DECOMPRESSION-RIGHT;  Surgeon: Christena Flake, MD;  Location: [facility] ORS;  Service: Orthopedics;  Laterality: Right;   TUBAL LIGATION      Family History  Problem Relation Age of Onset   Hypertension Mother    Thyroid disease Mother    Lupus Mother    Congestive Heart Failure Mother    Hypertension Brother    CAD Maternal Grandmother    Breast cancer Neg Hx    Ovarian cancer Neg Hx    Colon cancer Neg Hx     Social History   Socioeconomic History   Marital status: Married    Spouse name: brian   Number of children: 7   Years of education: Not on file   Highest education level: 11th grade  Occupational History   Occupation: disabled    Comment: disabled  Tobacco Use   Smoking status: Former    Packs/day: 2.00    Years: 25.00    Additional pack years: 0.00    Total pack years: 50.00    Types: Cigarettes    Quit date: 06/09/2017    Years since quitting: 5.7   Smokeless tobacco: Never  Vaping Use   Vaping Use: Never used  Substance and Sexual Activity   Alcohol use: No   Drug use: Yes    Frequency: 7.0 times per week    Types: Marijuana    Comment: pt states as needed when she is hurting real bad   Sexual activity: Yes    Partners: Male    Birth control/protection: None  Other Topics Concern   Not on file  Social History Narrative   Have 6  biological childrean and custody of niece since 73 months.   Social Determinants of Health   Financial Resource Strain: Low Risk  (02/16/2023)   Overall Financial Resource Strain (CARDIA)    Difficulty of Paying Living Expenses: Not very hard  Food Insecurity: Food Insecurity Present (02/16/2023)   Hunger Vital Sign    Worried About Running Out of Food in the Last Year: Sometimes true    Ran Out of Food in the Last Year: Sometimes true  Transportation Needs: No Transportation Needs (02/16/2023)   PRAPARE - Administrator, Civil Service (Medical): No    Lack of Transportation (Non-Medical): No  Physical Activity: Unknown (02/16/2023)   Exercise Vital Sign    Days of Exercise per Week: Patient declined  Minutes of Exercise per Session: Not on file  Stress: No Stress Concern Present (02/16/2023)   [employer] of Occupational Health - Occupational Stress Questionnaire    Feeling of Stress : Only a little  Social Connections: Moderately Isolated (02/16/2023)   Social Connection and Isolation Panel [NHANES]    Frequency of Communication with Friends and Family: Once a week    Frequency of Social Gatherings with Friends and Family: Never    Attends Religious Services: More than 4 times per year    Active Member of [employer] or Organizations: No    Attends Banker Meetings: Not on file    Marital Status: Married  Catering manager Violence: Not At Risk (08/07/2019)   Humiliation, Afraid, Rape, and Kick questionnaire    Fear of Current or Ex-Partner: No    Emotionally Abused: No    Physically Abused: No    Sexually Abused: No    Outpatient Medications Prior to Visit  Medication Sig Dispense Refill   albuterol (VENTOLIN HFA) 108 (90 Base) MCG/ACT inhaler Inhale 2 puffs into the lungs every 6 (six) hours as needed for wheezing or shortness of breath. 18 g 3   atorvastatin (LIPITOR) 40 MG tablet Take 1 tablet (40 mg total) by mouth daily. 90 tablet 3   Continuous  Blood Gluc Sensor (FREESTYLE LIBRE 3 SENSOR) MISC 1 each by Does not apply route 4 (four) times daily. Place 1 sensor on the skin every 14 days. Use to check glucose continuously 2 each 3   docusate sodium (COLACE) 100 MG capsule Take 1 capsule (100 mg total) by mouth 2 (two) times daily as needed. 30 capsule 2   gabapentin (NEURONTIN) 300 MG capsule Take 1 capsule (300 mg total) by mouth 3 (three) times daily. 90 capsule 1   Glucagon (GVOKE HYPOPEN 1-PACK) 1 MG/0.2ML SOAJ Inject 1 mg into the skin as needed (hypogylcemia). 0.2 mL 2   glucose blood (ACCU-CHEK GUIDE) test strip Test blood sugar tid, Dx E11.65 100 each 12   insulin glargine (LANTUS) 100 UNIT/ML injection Inject 0.6 mLs (60 Units total) into the skin 2 (two) times daily. 10 mL 9   insulin lispro (HUMALOG) 100 UNIT/ML injection Inject 0.3 mLs (30 Units total) into the skin 3 (three) times daily before meals. 10 mL 11   Insulin Syringe-Needle U-100 (INSULIN SYRINGE 1CC/31GX5/16") 31G X 5/16" 1 ML MISC Use 4 times daily as directed. 100 each 3   ketorolac (TORADOL) 10 MG tablet Take 1 tablet (10 mg total) by mouth every 8 (eight) hours as needed for severe pain. 20 tablet 0   lisinopril (ZESTRIL) 40 MG tablet Take 1 tablet (40 mg total) by mouth at bedtime. 90 tablet 1   omeprazole (PRILOSEC) 40 MG capsule Take 1 capsule (40 mg total) by mouth daily before breakfast. 30 capsule 1   Semaglutide,0.25 or 0.5MG /DOS, (OZEMPIC, 0.25 OR 0.5 MG/DOSE,) 2 MG/3ML SOPN Inject 0.5 mg into the skin once a week. 2 mL 3   sertraline (ZOLOFT) 25 MG tablet Take 1 tablet (25 mg total) by mouth daily. 90 tablet 0   tiZANidine (ZANAFLEX) 4 MG tablet Take 1 tablet (4 mg total) by mouth every 6 (six) hours as needed for muscle spasms. 30 tablet 0   No facility-administered medications prior to visit.    Allergies  Allergen Reactions   Dilaudid [Hydromorphone Hcl] Nausea And Vomiting   Cymbalta [Duloxetine Hcl] Nausea And Vomiting   Morphine And Related  Nausea And Vomiting   Tramadol  Nausea Only    If she takes antinausea medicine with this medicine, then she can tolerate it.    ROS Review of Systems  All other systems reviewed and are negative.     Objective:    Physical Exam Constitutional:      Appearance: Normal appearance. She is obese.  HENT:     Head: Normocephalic and atraumatic.  Eyes:     Conjunctiva/sclera: Conjunctivae normal.  Cardiovascular:     Rate and Rhythm: Normal rate and regular rhythm.  Pulmonary:     Effort: Pulmonary effort is normal.     Breath sounds: Normal breath sounds.  Musculoskeletal:     Right lower leg: Edema present.     Left lower leg: Edema present.     Comments: 1+ BLE pitting edema, slightly worse on the left  Skin:    General: Skin is warm and dry.  Neurological:     General: No focal deficit present.     Mental Status: She is alert. Mental status is at baseline.  Psychiatric:        Mood and Affect: Mood normal.        Behavior: Behavior normal.     LMP 02/22/2022 (Approximate)  Wt Readings from Last 3 Encounters:  03/16/23 (!) 321 lb 1.6 oz (145.7 kg)  02/21/23 (!) 335 lb (152 kg)  02/17/23 (!) 335 lb 11.2 oz (152.3 kg)     Health Maintenance Due  Topic Date Due   COVID-19 Vaccine (6 - 2023-24 season) 07/23/2022   Lung Cancer Screening  01/29/2023   OPHTHALMOLOGY EXAM  03/05/2023    There are no preventive care reminders to display for this patient.  Lab Results  Component Value Date   TSH 1.614 03/15/2022   Lab Results  Component Value Date   WBC 7.2 02/21/2023   HGB 12.7 02/21/2023   HCT 41.4 02/21/2023   MCV 95.4 02/21/2023   PLT 243 02/21/2023   Lab Results  Component Value Date   NA 137 02/21/2023   K 4.0 02/21/2023   CO2 27 02/21/2023   GLUCOSE 120 (H) 02/21/2023   BUN 21 (H) 02/21/2023   CREATININE 1.05 (H) 02/21/2023   BILITOT 0.9 02/21/2023   ALKPHOS 74 02/21/2023   AST 22 02/21/2023   ALT 15 02/21/2023   PROT 7.4 02/21/2023   ALBUMIN  3.8 02/21/2023   CALCIUM 8.8 (L) 02/21/2023   ANIONGAP 9 02/21/2023   EGFR 79 01/18/2022   Lab Results  Component Value Date   CHOL 185 12/14/2021   Lab Results  Component Value Date   HDL 41 (L) 12/14/2021   Lab Results  Component Value Date   LDLCALC 122 (H) 12/14/2021   Lab Results  Component Value Date   TRIG 109 12/14/2021   Lab Results  Component Value Date   CHOLHDL 4.5 12/14/2021   Lab Results  Component Value Date   HGBA1C 10.0 (A) 02/07/2023      Assessment & Plan:   1. Type 2 diabetes mellitus with hyperglycemia, with long-term current use of insulin (HCC): A1c uncontrolled however recently having lower blood sugars throughout the day, this must be due to change in diet. Decrease Lantus to 20 units at night but continue 30 units in the morning. Since her morning meal is smaller, decrease Humalog to 10 units and decrease to 20 units with dinner. Will try low dose Ozempic as well and follow up in 1 month. Refill test strips.   - Semaglutide,0.25 or 0.5MG /DOS, (OZEMPIC,  0.25 OR 0.5 MG/DOSE,) 2 MG/3ML SOPN; Inject 0.5 mg into the skin once a week.  Dispense: 2 mL; Refill: 3 - glucose blood (ACCU-CHEK GUIDE) test strip; Use as instructed  Dispense: 100 each; Refill: 12 - Glucagon (GVOKE HYPOPEN 1-PACK) 1 MG/0.2ML SOAJ; Inject 1 mg into the skin as needed (hypogylcemia).  Dispense: 0.2 mL; Refill: 2  2. Multiple episodes of hypoglycemia: Discussed eating strategies to try to avoid hypoglycemia, will try to increase protein to help keep blood sugar levels stable. Will prescribe glucagon pen to use in emergencies.   - Glucagon (GVOKE HYPOPEN 1-PACK) 1 MG/0.2ML SOAJ; Inject 1 mg into the skin as needed (hypogylcemia).  Dispense: 0.2 mL; Refill: 2  3. Lower extremity edema: [place] negative for DVT, 1+ pitting edema on exam. Keep legs elevated and recommend she go to [employer] for customized compression stockings.    Follow-up: No follow-ups on file.    Margarita Mail, DO

## 2023-03-23 ENCOUNTER — Encounter: Payer: Medicaid Other | Admitting: Physical Therapy

## 2023-03-24 ENCOUNTER — Telehealth: Payer: Self-pay | Admitting: Internal Medicine

## 2023-03-24 ENCOUNTER — Emergency Department: Payer: Medicaid Other

## 2023-03-24 ENCOUNTER — Emergency Department
Admission: EM | Admit: 2023-03-24 | Discharge: 2023-03-24 | Disposition: A | Discharge status: Home / Self Care | Payer: Medicaid Other | Attending: Emergency Medicine | Admitting: Emergency Medicine

## 2023-03-24 ENCOUNTER — Ambulatory Visit: Payer: Medicaid Other | Admitting: Internal Medicine

## 2023-03-24 ENCOUNTER — Other Ambulatory Visit: Payer: Self-pay

## 2023-03-24 ENCOUNTER — Encounter: Payer: Self-pay | Admitting: Emergency Medicine

## 2023-03-24 DIAGNOSIS — W1839XA Other fall on same level, initial encounter: Secondary | ICD-10-CM | POA: Insufficient documentation

## 2023-03-24 DIAGNOSIS — W19XXXA Unspecified fall, initial encounter: Secondary | ICD-10-CM

## 2023-03-24 DIAGNOSIS — S8002XA Contusion of left knee, initial encounter: Secondary | ICD-10-CM | POA: Diagnosis not present

## 2023-03-24 DIAGNOSIS — Y92810 Car as the place of occurrence of the external cause: Secondary | ICD-10-CM | POA: Insufficient documentation

## 2023-03-24 DIAGNOSIS — S8992XA Unspecified injury of left lower leg, initial encounter: Secondary | ICD-10-CM | POA: Diagnosis present

## 2023-03-24 MED ORDER — HYDROCODONE-ACETAMINOPHEN 5-325 MG PO TABS: 1.0000 | ORAL_TABLET | Freq: Four times a day (QID) | ORAL | 0 refills | Status: DC | PRN

## 2023-03-24 MED ORDER — HYDROCODONE-ACETAMINOPHEN 5-325 MG PO TABS
1.0000 | ORAL_TABLET | Freq: Once | ORAL | Status: AC
  Administered 2023-03-24: 1 via ORAL
  Filled 2023-03-24: qty 1

## 2023-03-24 NOTE — Telephone Encounter (Signed)
Pt came by after leaving the ER saying that the ER doctor and Medicaid said to see if her primary care doctor would write her a rx for her pain where she fell. She said that medicaid has a hold on her till nov 2024 and will not release the hold till then because she has had 3 different types of surgery by 3 different doctors that prescribed her pain meds after her surgery. I told her that Dr Caralee Ates would not prescribe pain meds but she is so much pain was wondering if there was something we may could give her. Please advise the patient.

## 2023-03-24 NOTE — Discharge Instructions (Signed)
Follow-up with your primary care provider if any continued problems.  Use the walker and knee immobilizer for support and protection of your knee.  Ice and elevation when possible.  You do not have to wear the knee immobilizer when you are in bed however he should be wearing it for the next several days when you are up walking and until you feel more stable on your leg.  Also continue to schedule with your orthopedist for further surgery in the future.  You will need to talk to your primary care provider about pain medication due to the medication lock on your prescription card.

## 2023-03-24 NOTE — ED Provider Notes (Signed)
[facility] Provider Note    Event Date/Time   First MD Initiated Contact with Patient 03/24/23 [phone]     (approximate)   History   Fall (/)   HPI  Brenda Hicks is a 57 y.o. female presents to the ED with complaint of left knee pain.  Patient states that she fell last night getting into her car.  She states her felt like her knee gave out.  Patient was supposed of had a total knee replacement in March but was unable to do so due to an elevated A1c.  She now is looking at surgery for this summer and states her blood sugars are under better control.  She denies any head injury or loss of consciousness.     Physical Exam   Triage Vital Signs: ED Triage Vitals  Enc Vitals Group     BP 03/24/23 0835 (!) 156/89     Pulse Rate 03/24/23 0835 (!) 56     Resp 03/24/23 0835 18     Temp 03/24/23 0835 97.9 F (36.6 C)     Temp Source 03/24/23 0835 Oral     SpO2 03/24/23 0835 98 %     Weight --      Height --      Head Circumference --      Peak Flow --      Pain Score 03/24/23 0833 10     Pain Loc --      Pain Edu? --      Excl. in GC? --     Most recent vital signs: Vitals:   03/24/23 0835  BP: (!) 156/89  Pulse: (!) 56  Resp: 18  Temp: 97.9 F (36.6 C)  SpO2: 98%     General: Awake, no distress.  CV:  Good peripheral perfusion.   Resp:  Normal effort.  Abd:  No distention.  Other:  On examination of left knee there is no gross deformity and no effusion noted.  There is some generalized tenderness anteriorly.  Skin is intact.  Minimal soft tissue edema.  Range of motion is slow and guarded secondary to discomfort.  Distal pulses are present.  Patient has been able to stand and ambulate slowly.   ED Results / Procedures / Treatments   Labs (all labs ordered are listed, but only abnormal results are displayed) Labs Reviewed - No data to display    RADIOLOGY  Left knee x-ray images were reviewed and interpreted by myself independent  of the radiologist and no fracture or dislocation was noted.  Radiology also reports a small to moderate effusion present.   PROCEDURES:  Critical Care performed:   Procedures   MEDICATIONS ORDERED IN ED: Medications  HYDROcodone-acetaminophen (NORCO/VICODIN) 5-325 MG per tablet 1 tablet (1 tablet Oral Given 03/24/23 1010)     IMPRESSION / MDM / ASSESSMENT AND PLAN / ED COURSE  I reviewed the triage vital signs and the nursing notes.   Differential diagnosis includes, but is not limited to, fracture left knee, dislocation, strain/sprain, contusion, exacerbation of osteoarthritis.  57 year old female presents to the ED after she fell against the car last evening injuring her left knee.  She states that she was scheduled to have a total knee replacement earlier this year but had to be rescheduled due to her A1c being elevated.  X-rays were reassuring and patient was made aware that there was no fracture.  She was placed in a knee immobilizer and given a walker to use  for protection and support.  A prescription for hydrocodone was sent to the pharmacy for her to begin taking every 6 hours as needed for pain.  She is to contact her orthopedist if any continued problems and also for her to discuss her total knee replacement.      Patient's presentation is most consistent with acute complicated illness / injury requiring diagnostic workup.  FINAL CLINICAL IMPRESSION(S) / ED DIAGNOSES   Final diagnoses:  Contusion of left knee, initial encounter  Fall, initial encounter     Rx / DC Orders   ED Discharge Orders          Ordered    HYDROcodone-acetaminophen (NORCO/VICODIN) 5-325 MG tablet  Every 6 hours PRN        03/24/23 1011             Note:  This document was prepared using Dragon voice recognition software and may include unintentional dictation errors.   Tommi Rumps, PA-C 03/24/23 1313    Georga Hacking, MD 03/24/23 [phone]

## 2023-03-24 NOTE — ED Triage Notes (Signed)
Patient to ED for left knee pain. Patient states she fell yesterday while getting into car. Felt like knee gave out. Was suppose to have knee replacement in March but unable due to elevated A1C.

## 2023-03-24 NOTE — ED Notes (Signed)
Patient Alert and oriented to baseline. Stable and ambulatory to baseline. Patient verbalized understanding of the discharge instructions.  Patient belongings were taken by the patient.   

## 2023-03-25 NOTE — Telephone Encounter (Signed)
Tried to cal mailbox full

## 2023-03-26 ENCOUNTER — Ambulatory Visit
Admission: RE | Admit: 2023-03-26 | Discharge: 2023-03-26 | Disposition: A | Discharge status: Home / Self Care | Payer: Medicaid Other | Source: Ambulatory Visit | Attending: Student | Admitting: Student

## 2023-03-26 DIAGNOSIS — M25512 Pain in left shoulder: Secondary | ICD-10-CM

## 2023-04-20 ENCOUNTER — Other Ambulatory Visit: Payer: Self-pay | Admitting: Surgery

## 2023-04-21 ENCOUNTER — Encounter
Admission: RE | Admit: 2023-04-21 | Discharge: 2023-04-21 | Disposition: A | Discharge status: Home / Self Care | Payer: Medicaid Other | Source: Ambulatory Visit | Attending: Surgery | Admitting: Surgery

## 2023-04-21 ENCOUNTER — Other Ambulatory Visit: Payer: Self-pay

## 2023-04-21 VITALS — BP 152/92 | HR 77 | Resp 18 | Ht 66.0 in | Wt 316.6 lb

## 2023-04-21 DIAGNOSIS — Z01812 Encounter for preprocedural laboratory examination: Secondary | ICD-10-CM

## 2023-04-21 DIAGNOSIS — Z01818 Encounter for other preprocedural examination: Secondary | ICD-10-CM | POA: Diagnosis present

## 2023-04-21 DIAGNOSIS — E1142 Type 2 diabetes mellitus with diabetic polyneuropathy: Secondary | ICD-10-CM | POA: Diagnosis present

## 2023-04-21 HISTORY — DX: Depression, unspecified: F32.A

## 2023-04-21 HISTORY — DX: Anxiety disorder, unspecified: F41.9

## 2023-04-21 LAB — COMPREHENSIVE METABOLIC PANEL
ALT: 18 U/L (ref 0–44)
AST: 19 U/L (ref 15–41)
Albumin: 3.8 g/dL (ref 3.5–5.0)
Alkaline Phosphatase: 74 U/L (ref 38–126)
Anion gap: 9 (ref 5–15)
BUN: 14 mg/dL (ref 6–20)
CO2: 28 mmol/L (ref 22–32)
Calcium: 8.8 mg/dL — ABNORMAL LOW (ref 8.9–10.3)
Chloride: 103 mmol/L (ref 98–111)
Creatinine, Ser: 1.1 mg/dL — ABNORMAL HIGH (ref 0.44–1.00)
GFR, Estimated: 59 mL/min — ABNORMAL LOW (ref >60)
Glucose, Bld: 124 mg/dL — ABNORMAL HIGH (ref 70–99)
Potassium: 3.8 mmol/L (ref 3.5–5.1)
Sodium: 140 mmol/L (ref 135–145)
Total Bilirubin: 0.3 mg/dL (ref 0.3–1.2)
Total Protein: 7.6 g/dL (ref 6.5–8.1)

## 2023-04-21 LAB — CBC WITH DIFFERENTIAL/PLATELET
Abs Immature Granulocytes: 0.02 10*3/uL (ref 0.00–0.07)
Basophils Absolute: 0 10*3/uL (ref 0.0–0.1)
Basophils Relative: 0 %
Eosinophils Absolute: 0.1 10*3/uL (ref 0.0–0.5)
Eosinophils Relative: 1 %
HCT: 42.3 % (ref 36.0–46.0)
Hemoglobin: 13.5 g/dL (ref 12.0–15.0)
Immature Granulocytes: 0 %
Lymphocytes Relative: 33 %
Lymphs Abs: 1.9 10*3/uL (ref 0.7–4.0)
MCH: 29.7 pg (ref 26.0–34.0)
MCHC: 31.9 g/dL (ref 30.0–36.0)
MCV: 93.2 fL (ref 80.0–100.0)
Monocytes Absolute: 0.4 10*3/uL (ref 0.1–1.0)
Monocytes Relative: 7 %
Neutro Abs: 3.5 10*3/uL (ref 1.7–7.7)
Neutrophils Relative %: 59 %
Platelets: 238 10*3/uL (ref 150–400)
RBC: 4.54 MIL/uL (ref 3.87–5.11)
RDW: 13.6 % (ref 11.5–15.5)
WBC: 5.9 10*3/uL (ref 4.0–10.5)
nRBC: 0 % (ref 0.0–0.2)

## 2023-04-21 LAB — URINALYSIS, ROUTINE W REFLEX MICROSCOPIC
Bilirubin Urine: NEGATIVE
Glucose, UA: NEGATIVE mg/dL
Hgb urine dipstick: NEGATIVE
Ketones, ur: NEGATIVE mg/dL
Leukocytes,Ua: NEGATIVE
Nitrite: NEGATIVE
Protein, ur: NEGATIVE mg/dL
Specific Gravity, Urine: 1.023 (ref 1.005–1.030)
pH: 5 (ref 5.0–8.0)

## 2023-04-21 LAB — TYPE AND SCREEN
ABO/RH(D): O NEG
Antibody Screen: NEGATIVE

## 2023-04-21 LAB — SURGICAL PCR SCREEN
MRSA, PCR: NEGATIVE
Staphylococcus aureus: NEGATIVE

## 2023-04-21 NOTE — Patient Instructions (Addendum)
Your procedure is scheduled on: 04/26/23 - Tuesday Report to the Registration Desk on the 1st floor of the [facility]. To find out your arrival time, please call [phone] between 1PM - 3PM on: 04/22/23 - Friday If your arrival time is 6:00 am, do not arrive before that time as the [facility] entrance doors do not open until 6:00 am.  REMEMBER: Instructions that are not followed completely may result in serious medical risk, up to and including death; or upon the discretion of your surgeon and anesthesiologist your surgery may need to be rescheduled.  Do not eat food after midnight the night before surgery.  No gum chewing or hard candies.  You may drink water up to 2 hours before you are scheduled to arrive for your surgery. Do not drink anything within 2 hours of your scheduled arrival time.  In addition, your doctor has ordered for you to drink the provided:  Gatorade G2 Drinking this carbohydrate drink up to two hours before surgery helps to reduce insulin resistance and improve patient outcomes. Please complete drinking 2 hours before scheduled arrival time.  One week prior to surgery: Stop Anti-inflammatories (NSAIDS) such as Advil, Aleve, Ibuprofen, Motrin, Naproxen, Naprosyn and Aspirin based products such as Excedrin, Goody's Powder, BC Powder.  Stop ANY OVER THE COUNTER supplements until after surgery.  You may however, continue to take Tylenol if needed for pain up until the day of surgery.  Continue taking all prescribed medications with the exception of the following:  Semaglutide, (OZEMPIC ) hold for 7 days prior to your surgery beginning  04/19/23.    TAKE ONLY THESE MEDICATIONS THE MORNING OF SURGERY WITH A SIP OF WATER: NONE    Use inhalers on the day of surgery and bring to the hospital.  No Alcohol for 24 hours before or after surgery.  No Smoking including e-cigarettes for 24 hours before surgery.  No chewable tobacco products for at least 6 hours  before surgery.  No nicotine patches on the day of surgery.  Do not use any "recreational" drugs for at least a week (preferably 2 weeks) before your surgery.  Please be advised that the combination of cocaine and anesthesia may have negative outcomes, up to and including death. If you test positive for cocaine, your surgery will be cancelled.  On the morning of surgery brush your teeth with toothpaste and water, you may rinse your mouth with mouthwash if you wish. Do not swallow any toothpaste or mouthwash.  Use CHG Soap or wipes as directed on instruction sheet.  Do not wear jewelry, make-up, hairpins, clips or nail polish.  Do not wear lotions, powders, or perfumes.   Do not shave body hair from the neck down 48 hours before surgery.  Contact lenses, hearing aids and dentures may not be worn into surgery.  Do not bring valuables to the hospital. [facility] is not responsible for any missing/lost belongings or valuables.   Notify your doctor if there is any change in your medical condition (cold, fever, infection).  Wear comfortable clothing (specific to your surgery type) to the hospital.  After surgery, you can help prevent lung complications by doing breathing exercises.  Take deep breaths and cough every 1-2 hours. Your doctor may order a device called an Incentive Spirometer to help you take deep breaths. When coughing or sneezing, hold a pillow firmly against your incision with both hands. This is called "splinting." Doing this helps protect your incision. It also decreases belly discomfort.  If you are being admitted to the hospital overnight, leave your suitcase in the car. After surgery it may be brought to your room.  In case of increased patient census, it may be necessary for you, the patient, to continue your postoperative care in the Same Day Surgery department.  If you are being discharged the day of surgery, you will not be allowed to drive home. You will need a  responsible individual to drive you home and stay with you for 24 hours after surgery.   If you are taking public transportation, you will need to have a responsible individual with you.  Please call the Pre-admissions Testing Dept. at [phone] if you have any questions about these instructions.  Surgery Visitation Policy:  Patients having surgery or a procedure may have two visitors.  Children under the age of 61 must have an adult with them who is not the patient.  Inpatient Visitation:    Visiting hours are 7 a.m. to 8 p.m. Up to four visitors are allowed at one time in a patient room. The visitors may rotate out with other people during the day.  One visitor age 74 or older may stay with the patient overnight and must be in the room by 8 p.m.   Pre-operative 5 CHG Bath Instructions   You can play a key role in reducing the risk of infection after surgery. Your skin needs to be as free of germs as possible. You can reduce the number of germs on your skin by washing with CHG (chlorhexidine gluconate) soap before surgery. CHG is an antiseptic soap that kills germs and continues to kill germs even after washing.   DO NOT use if you have an allergy to chlorhexidine/CHG or antibacterial soaps. If your skin becomes reddened or irritated, stop using the CHG and notify one of our RNs at [phone].   Please shower with the CHG soap starting 4 days before surgery using the following schedule:     Please keep in mind the following:  DO NOT shave, including legs and underarms, starting the day of your first shower.   You may shave your face at any point before/day of surgery.  Place clean sheets on your bed the day you start using CHG soap. Use a clean washcloth (not used since being washed) for each shower. DO NOT sleep with pets once you start using the CHG.   CHG Shower Instructions:  If you choose to wash your hair and private area, wash first with your normal shampoo/soap.   After you use shampoo/soap, rinse your hair and body thoroughly to remove shampoo/soap residue.  Turn the water OFF and apply about 3 tablespoons (45 ml) of CHG soap to a CLEAN washcloth.  Apply CHG soap ONLY FROM YOUR NECK DOWN TO YOUR TOES (washing for 3-5 minutes)  DO NOT use CHG soap on face, private areas, open wounds, or sores.  Pay special attention to the area where your surgery is being performed.  If you are having back surgery, having someone wash your back for you may be helpful. Wait 2 minutes after CHG soap is applied, then you may rinse off the CHG soap.  Pat dry with a clean towel  Put on clean clothes/pajamas   If you choose to wear lotion, please use ONLY the CHG-compatible lotions on the back of this paper.     Additional instructions for the day of surgery: DO NOT APPLY any lotions, deodorants, cologne, or perfumes.   Put  on clean/comfortable clothes.  Brush your teeth.  Ask your nurse before applying any prescription medications to the skin.      CHG Compatible Lotions   Aveeno Moisturizing lotion  Cetaphil Moisturizing Cream  Cetaphil Moisturizing Lotion  Clairol Herbal Essence Moisturizing Lotion, Dry Skin  Clairol Herbal Essence Moisturizing Lotion, Extra Dry Skin  Clairol Herbal Essence Moisturizing Lotion, Normal Skin  Curel Age Defying Therapeutic Moisturizing Lotion with Alpha Hydroxy  Curel Extreme Care Body Lotion  Curel Soothing Hands Moisturizing Hand Lotion  Curel Therapeutic Moisturizing Cream, Fragrance-Free  Curel Therapeutic Moisturizing Lotion, Fragrance-Free  Curel Therapeutic Moisturizing Lotion, Original Formula  Eucerin Daily Replenishing Lotion  Eucerin Dry Skin Therapy Plus Alpha Hydroxy Crme  Eucerin Dry Skin Therapy Plus Alpha Hydroxy Lotion  Eucerin Original Crme  Eucerin Original Lotion  Eucerin Plus Crme Eucerin Plus Lotion  Eucerin TriLipid Replenishing Lotion  Keri Anti-Bacterial Hand Lotion  Keri Deep Conditioning  Original Lotion Dry Skin Formula Softly Scented  Keri Deep Conditioning Original Lotion, Fragrance Free Sensitive Skin Formula  Keri Lotion Fast Absorbing Fragrance Free Sensitive Skin Formula  Keri Lotion Fast Absorbing Softly Scented Dry Skin Formula  Keri Original Lotion  Keri Skin Renewal Lotion Keri Silky Smooth Lotion  Keri Silky Smooth Sensitive Skin Lotion  Nivea Body Creamy Conditioning Oil  Nivea Body Extra Enriched Lotion  Nivea Body Original Lotion  Nivea Body Sheer Moisturizing Lotion Nivea Crme  Nivea Skin Firming Lotion  NutraDerm 30 Skin Lotion  NutraDerm Skin Lotion  NutraDerm Therapeutic Skin Cream  NutraDerm Therapeutic Skin Lotion  ProShield Protective Hand Cream  Provon moisturizing lotion  How to Use an Incentive Spirometer  An incentive spirometer is a tool that measures how well you are filling your lungs with each breath. Learning to take long, deep breaths using this tool can help you keep your lungs clear and active. This may help to reverse or lessen your chance of developing breathing (pulmonary) problems, especially infection. You may be asked to use a spirometer: After a surgery. If you have a lung problem or a history of smoking. After a long period of time when you have been unable to move or be active. If the spirometer includes an indicator to show the highest number that you have reached, your health care provider or respiratory therapist will help you set a goal. Keep a log of your progress as told by your health care provider. What are the risks? Breathing too quickly may cause dizziness or cause you to pass out. Take your time so you do not get dizzy or light-headed. If you are in pain, you may need to take pain medicine before doing incentive spirometry. It is harder to take a deep breath if you are having pain. How to use your incentive spirometer  Sit up on the edge of your bed or on a chair. Hold the incentive spirometer so that it is in an  upright position. Before you use the spirometer, breathe out normally. Place the mouthpiece in your mouth. Make sure your lips are closed tightly around it. Breathe in slowly and as deeply as you can through your mouth, causing the piston or the ball to rise toward the top of the chamber. Hold your breath for 3-5 seconds, or for as long as possible. If the spirometer includes a coach indicator, use this to guide you in breathing. Slow down your breathing if the indicator goes above the marked areas. Remove the mouthpiece from your mouth and breathe out normally.  The piston or ball will return to the bottom of the chamber. Rest for a few seconds, then repeat the steps 10 or more times. Take your time and take a few normal breaths between deep breaths so that you do not get dizzy or light-headed. Do this every 1-2 hours when you are awake. If the spirometer includes a goal marker to show the highest number you have reached (best effort), use this as a goal to work toward during each repetition. After each set of 10 deep breaths, cough a few times. This will help to make sure that your lungs are clear. If you have an incision on your chest or abdomen from surgery, place a pillow or a rolled-up towel firmly against the incision when you cough. This can help to reduce pain while taking deep breaths and coughing. General tips When you are able to get out of bed: Walk around often. Continue to take deep breaths and cough in order to clear your lungs. Keep using the incentive spirometer until your health care provider says it is okay to stop using it. If you have been in the hospital, you may be told to keep using the spirometer at home. Contact a health care provider if: You are having difficulty using the spirometer. You have trouble using the spirometer as often as instructed. Your pain medicine is not giving enough relief for you to use the spirometer as told. You have a fever. Get help right away  if: You develop shortness of breath. You develop a cough with bloody mucus from the lungs. You have fluid or blood coming from an incision site after you cough. Summary An incentive spirometer is a tool that can help you learn to take long, deep breaths to keep your lungs clear and active. You may be asked to use a spirometer after a surgery, if you have a lung problem or a history of smoking, or if you have been inactive for a long period of time. Use your incentive spirometer as instructed every 1-2 hours while you are awake. If you have an incision on your chest or abdomen, place a pillow or a rolled-up towel firmly against your incision when you cough. This will help to reduce pain. Get help right away if you have shortness of breath, you cough up bloody mucus, or blood comes from your incision when you cough. This information is not intended to replace advice given to you by your health care provider. Make sure you discuss any questions you have with your health care provider. Document Revised: 01/28/2020 Document Reviewed: 01/28/2020 Elsevier Patient Education  2023 Elsevier Inc.  POLAR CARE INFORMATION  MassAdvertisement.it  How to use [facility] Therapy System?  YouTube   ShippingScam.co.uk  OPERATING INSTRUCTIONS  Start the product With dry hands, connect the transformer to the electrical connection located on the top of the cooler. Next, plug the transformer into an appropriate electrical outlet. The unit will automatically start running at this point.  To stop the pump, disconnect electrical power.  Unplug to stop the product when not in use. Unplugging the Polar Care unit turns it off. Always unplug immediately after use. Never leave it plugged in while unattended. Remove pad.    FIRST ADD WATER TO FILL LINE, THEN ICE---Replace ice when existing ice is almost melted  1 Discuss Treatment with your Licensed Health Care Practitioner and Use  Only as Prescribed 2 Apply Insulation Barrier & Cold Therapy Pad 3 Check for Moisture  4 Inspect Skin Regularly  Tips and Conservation officer, historic buildings Tips 1. Use cubed or chunked ice for optimal performance. 2. It is recommended to drain the Pad between uses. To drain the pad, hold the Pad upright with the hose pointed toward the ground. Depress the black plunger and allow water to drain out. 3. You may disconnect the Pad from the unit without removing the pad from the affected area by depressing the silver tabs on the hose coupling and gently pulling the hoses apart. The Pad and unit will seal itself and will not leak. Note: Some dripping during release is normal. 4. DO NOT RUN PUMP WITHOUT WATER! The pump in this unit is designed to run with water. Running the unit without water will cause permanent damage to the pump. 5. Unplug unit before removing lid.  TROUBLESHOOTING GUIDE Pump not running, Water not flowing to the pad, Pad is not getting cold 1. Make sure the transformer is plugged into the wall outlet. 2. Confirm that the ice and water are filled to the indicated levels. 3. Make sure there are no kinks in the pad. 4. Gently pull on the blue tube to make sure the tube/pad junction is straight. 5. Remove the pad from the treatment site and ll it while the pad is lying at; then reapply. 6. Confirm that the pad couplings are securely attached to the unit. Listen for the double clicks (Figure 1) to confirm the pad couplings are securely attached.  Leaks    Note: Some condensation on the lines, controller, and pads is unavoidable, especially in warmer climates. 1. If using a [employer] Polar Care Cold Therapy unit with a detachable Cold Therapy Pad, and a leak exists (other than condensation on the lines) disconnect the pad couplings. Make sure the silver tabs on the couplings are depressed before reconnecting the pad to the pump hose; then confirm both sides of the coupling are properly clicked in. 2.  If the coupling continues to leak or a leak is detected in the pad itself, stop using it and call [employer] Customer Care at [phone].  Cleaning After use, empty and dry the unit with a soft cloth. Warm water and mild detergent may be used occasionally to clean the pump and tubes.  WARNING: The Polar Care Cube can be cold enough to cause serious injury, including full skin necrosis. Follow these Operating Instructions, and carefully read the Product Insert (see pouch on side of unit) and the Cold Therapy Pad Fitting Instructions (provided with each Cold Therapy Pad) prior to use.

## 2023-04-22 NOTE — Progress Notes (Signed)
  Perioperative Services Pre-Admission/Anesthesia Testing    Date: 04/22/23  Name: Brenda Hicks MRN:   [id number]  Re: GLP-1 clearance and provider recommendations   Planned Surgical Procedure(s):    Case: [id number] Date/Time: 04/26/23 0715   Procedure: TOTAL KNEE ARTHROPLASTY (Left: Knee)   Anesthesia type: Choice   Pre-op diagnosis: Primary osteoarthritis of left knee M17.12   Location: [facility] OR ROOM 03 / [facility]   Surgeons: Christena Flake, MD      Clinical Notes:  Patient is scheduled for the above procedure with the indicated provider/surgeon. In review of her medication reconciliation it was noted that patient is on a prescribed GLP-1 medication. Per guidelines issued by the American Society of Anesthesiologists (ASA), it is recommended that these medications be held for 7 days prior to the patient undergoing any type of elective surgical procedure. The patient is taking the following GLP-1 medication:  [x]  SEMAGLUTIDE   []  EXENATIDE  []  LIRAGLUTIDE   []  LIXISENATIDE  []  DULAGLUTIDE     []  TIRZEPATIDE (GLP-1/GIP)  Reached out to prescribing provider Caralee Ates, DO) to make them aware of the guidelines from anesthesia. Given that this patient takes the prescribed GLP-1 medication for her  diabetes diagnosis, rather than for weight loss, recommendations from the prescribing provider were solicited. Prescribing provider made aware of the following so that informed decision/POC can be developed for this patient that may be taking medications belonging to these drug classes:  Oral GLP-1 medications will be held 1 day prior to surgery.  Injectable GLP-1 medications will be held 7 days prior to surgery.  Metformin is routinely held 48 hours prior to surgery due to renal concerns, potential need for contrasted imaging perioperatively, and the potential for tissue hypoxia leading to drug induced lactic acidosis.  All SGLT2i medications are held 72 hours prior to  surgery as they can be associated with the increased potential for developing euglycemic diabetic ketoacidosis (EDKA).   Impression and Plan:  Brenda Hicks is on a prescribed GLP-1 medication, which induces the known side effect of decreased gastric emptying. Efforts are bring made to mitigate the risk of perioperative hyperglycemic events, as elevated blood glucose levels have been found to contribute to intra/postoperative complications. Additionally, hyperglycemic extremes can potentially necessitate the postponing of a patient's elective case in order to better optimize perioperative glycemic control, again with the aforementioned guidelines in place. With this in mind, recommendations have been sought from the prescribing provider, who has cleared patient to proceed with holding the prescribed GLP-1 as per the guidelines from the ASA.   Provider recommending: no further recommendations received from the prescribing provider.  Copy of signed clearance and recommendations placed on patient's chart for inclusion in their medical record and for review by the surgical/anesthetic team on the day of her procedure.   Quentin Mulling, MSN, APRN, FNP-C, CEN [facility]  Peri-operative Services Nurse Practitioner Phone: [phone] 04/22/23 1:38 PM  NOTE: This note has been prepared using Dragon dictation software. Despite my best ability to proofread, there is always the potential that unintentional transcriptional errors may still occur from this process.

## 2023-04-25 MED ORDER — FAMOTIDINE 20 MG PO TABS
20.0000 mg | ORAL_TABLET | Freq: Once | ORAL | Status: AC
  Administered 2023-04-26: 20 mg via ORAL

## 2023-04-25 MED ORDER — CHLORHEXIDINE GLUCONATE 0.12 % MT SOLN
15.0000 mL | Freq: Once | OROMUCOSAL | Status: AC
  Administered 2023-04-26: 15 mL via OROMUCOSAL

## 2023-04-25 MED ORDER — ORAL CARE MOUTH RINSE: 15.0000 mL | Freq: Once | OROMUCOSAL | Status: AC

## 2023-04-25 MED ORDER — SODIUM CHLORIDE 0.9 % IV SOLN: INTRAVENOUS | Status: DC

## 2023-04-25 MED ORDER — CEFAZOLIN IN SODIUM CHLORIDE 3-0.9 GM/100ML-% IV SOLN
3.0000 g | INTRAVENOUS | Status: AC
  Administered 2023-04-26: 3 g via INTRAVENOUS
  Filled 2023-04-25: qty 100

## 2023-04-26 ENCOUNTER — Observation Stay
Admission: RE | Admit: 2023-04-26 | Discharge: 2023-04-27 | Disposition: A | Discharge status: Home / Self Care | Payer: Medicaid Other | Attending: Surgery | Admitting: Surgery

## 2023-04-26 ENCOUNTER — Other Ambulatory Visit: Payer: Self-pay

## 2023-04-26 ENCOUNTER — Encounter
Admission: RE | Disposition: A | Discharge status: Home / Self Care | Payer: Self-pay | Source: Home / Self Care | Attending: Surgery

## 2023-04-26 ENCOUNTER — Ambulatory Visit: Payer: Medicaid Other | Admitting: Urgent Care

## 2023-04-26 ENCOUNTER — Encounter: Payer: Self-pay | Admitting: Surgery

## 2023-04-26 ENCOUNTER — Ambulatory Visit: Payer: Medicaid Other

## 2023-04-26 ENCOUNTER — Ambulatory Visit: Payer: Medicaid Other | Admitting: Orthopedic Surgery

## 2023-04-26 DIAGNOSIS — Z96651 Presence of right artificial knee joint: Secondary | ICD-10-CM | POA: Diagnosis not present

## 2023-04-26 DIAGNOSIS — E1165 Type 2 diabetes mellitus with hyperglycemia: Secondary | ICD-10-CM

## 2023-04-26 DIAGNOSIS — M1712 Unilateral primary osteoarthritis, left knee: Principal | ICD-10-CM | POA: Insufficient documentation

## 2023-04-26 DIAGNOSIS — E119 Type 2 diabetes mellitus without complications: Secondary | ICD-10-CM | POA: Diagnosis not present

## 2023-04-26 DIAGNOSIS — Z794 Long term (current) use of insulin: Secondary | ICD-10-CM | POA: Insufficient documentation

## 2023-04-26 DIAGNOSIS — Z85828 Personal history of other malignant neoplasm of skin: Secondary | ICD-10-CM | POA: Diagnosis not present

## 2023-04-26 DIAGNOSIS — J45909 Unspecified asthma, uncomplicated: Secondary | ICD-10-CM | POA: Diagnosis not present

## 2023-04-26 DIAGNOSIS — J449 Chronic obstructive pulmonary disease, unspecified: Secondary | ICD-10-CM | POA: Diagnosis not present

## 2023-04-26 DIAGNOSIS — Z79899 Other long term (current) drug therapy: Secondary | ICD-10-CM | POA: Diagnosis not present

## 2023-04-26 DIAGNOSIS — Z87891 Personal history of nicotine dependence: Secondary | ICD-10-CM | POA: Insufficient documentation

## 2023-04-26 DIAGNOSIS — Z01812 Encounter for preprocedural laboratory examination: Secondary | ICD-10-CM

## 2023-04-26 DIAGNOSIS — E1142 Type 2 diabetes mellitus with diabetic polyneuropathy: Secondary | ICD-10-CM

## 2023-04-26 DIAGNOSIS — I10 Essential (primary) hypertension: Secondary | ICD-10-CM | POA: Insufficient documentation

## 2023-04-26 DIAGNOSIS — Z96652 Presence of left artificial knee joint: Secondary | ICD-10-CM

## 2023-04-26 DIAGNOSIS — Z8673 Personal history of transient ischemic attack (TIA), and cerebral infarction without residual deficits: Secondary | ICD-10-CM | POA: Insufficient documentation

## 2023-04-26 HISTORY — PX: TOTAL KNEE ARTHROPLASTY: SHX125

## 2023-04-26 LAB — GLUCOSE, CAPILLARY
Glucose-Capillary: 130 mg/dL — ABNORMAL HIGH (ref 70–99)
Glucose-Capillary: 152 mg/dL — ABNORMAL HIGH (ref 70–99)
Glucose-Capillary: 159 mg/dL — ABNORMAL HIGH (ref 70–99)
Glucose-Capillary: 247 mg/dL — ABNORMAL HIGH (ref 70–99)

## 2023-04-26 SURGERY — ARTHROPLASTY, KNEE, TOTAL
Anesthesia: General | Site: Knee | Laterality: Left

## 2023-04-26 MED ORDER — ONDANSETRON HCL 4 MG PO TABS: 4.0000 mg | ORAL_TABLET | Freq: Four times a day (QID) | ORAL | Status: DC | PRN

## 2023-04-26 MED ORDER — KETOROLAC TROMETHAMINE 30 MG/ML IJ SOLN
30.0000 mg | Freq: Once | INTRAMUSCULAR | Status: AC
  Administered 2023-04-26: 30 mg via INTRAVENOUS

## 2023-04-26 MED ORDER — APIXABAN 2.5 MG PO TABS
2.5000 mg | ORAL_TABLET | Freq: Two times a day (BID) | ORAL | Status: DC
  Administered 2023-04-27: 2.5 mg via ORAL

## 2023-04-26 MED ORDER — GLYCOPYRROLATE 0.2 MG/ML IJ SOLN
INTRAMUSCULAR | Status: DC | PRN
  Administered 2023-04-26: .1 mg via INTRAVENOUS

## 2023-04-26 MED ORDER — LABETALOL HCL 5 MG/ML IV SOLN
INTRAVENOUS | Status: DC | PRN
  Administered 2023-04-26: 10 mg via INTRAVENOUS

## 2023-04-26 MED ORDER — OXYCODONE HCL 5 MG PO TABS
ORAL_TABLET | ORAL | Status: AC
  Filled 2023-04-26: qty 2

## 2023-04-26 MED ORDER — MIDAZOLAM HCL 2 MG/2ML IJ SOLN
INTRAMUSCULAR | Status: AC
  Filled 2023-04-26: qty 2

## 2023-04-26 MED ORDER — MAGNESIUM HYDROXIDE 400 MG/5ML PO SUSP: 30.0000 mL | Freq: Every day | ORAL | Status: DC | PRN

## 2023-04-26 MED ORDER — OXYCODONE HCL 5 MG PO TABS: 5.0000 mg | ORAL_TABLET | ORAL | 0 refills | Status: DC | PRN

## 2023-04-26 MED ORDER — ACETAMINOPHEN 500 MG PO TABS
ORAL_TABLET | ORAL | Status: AC
  Filled 2023-04-26: qty 2

## 2023-04-26 MED ORDER — BISACODYL 10 MG RE SUPP: 10.0000 mg | Freq: Every day | RECTAL | Status: DC | PRN

## 2023-04-26 MED ORDER — ALBUTEROL SULFATE HFA 108 (90 BASE) MCG/ACT IN AERS
INHALATION_SPRAY | RESPIRATORY_TRACT | Status: DC | PRN
  Administered 2023-04-26 (×2): 2 via RESPIRATORY_TRACT

## 2023-04-26 MED ORDER — PROPOFOL 10 MG/ML IV BOLUS
INTRAVENOUS | Status: DC | PRN
  Administered 2023-04-26: 50 mg via INTRAVENOUS
  Administered 2023-04-26: 10 mg via INTRAVENOUS
  Administered 2023-04-26 (×2): 20 mg via INTRAVENOUS
  Administered 2023-04-26: 80 mg via INTRAVENOUS

## 2023-04-26 MED ORDER — BUPIVACAINE HCL (PF) 0.5 % IJ SOLN
INTRAMUSCULAR | Status: AC
  Filled 2023-04-26: qty 30

## 2023-04-26 MED ORDER — METOCLOPRAMIDE HCL 5 MG/ML IJ SOLN: 5.0000 mg | Freq: Three times a day (TID) | INTRAMUSCULAR | Status: DC | PRN

## 2023-04-26 MED ORDER — KETOROLAC TROMETHAMINE 15 MG/ML IJ SOLN
INTRAMUSCULAR | Status: AC
  Filled 2023-04-26: qty 1

## 2023-04-26 MED ORDER — TRIAMCINOLONE ACETONIDE 40 MG/ML IJ SUSP
INTRAMUSCULAR | Status: AC
  Filled 2023-04-26: qty 2

## 2023-04-26 MED ORDER — PROPOFOL 1000 MG/100ML IV EMUL
INTRAVENOUS | Status: AC
  Filled 2023-04-26: qty 200

## 2023-04-26 MED ORDER — OXYCODONE HCL 5 MG PO TABS
5.0000 mg | ORAL_TABLET | ORAL | Status: DC | PRN
  Administered 2023-04-26: 10 mg via ORAL
  Administered 2023-04-26 – 2023-04-27 (×2): 5 mg via ORAL

## 2023-04-26 MED ORDER — FLEET ENEMA 7-19 GM/118ML RE ENEM: 1.0000 | ENEMA | Freq: Once | RECTAL | Status: DC | PRN

## 2023-04-26 MED ORDER — BUPIVACAINE HCL (PF) 0.5 % IJ SOLN
INTRAMUSCULAR | Status: AC
  Filled 2023-04-26: qty 10

## 2023-04-26 MED ORDER — FENTANYL CITRATE (PF) 100 MCG/2ML IJ SOLN
INTRAMUSCULAR | Status: DC | PRN
  Administered 2023-04-26 (×2): 25 ug via INTRAVENOUS

## 2023-04-26 MED ORDER — CEFAZOLIN SODIUM-DEXTROSE 2-4 GM/100ML-% IV SOLN
INTRAVENOUS | Status: AC
  Filled 2023-04-26: qty 100

## 2023-04-26 MED ORDER — INSULIN LISPRO 100 UNIT/ML IJ SOLN
5.0000 [IU] | Freq: Three times a day (TID) | INTRAMUSCULAR | Status: DC
Start: 2023-04-26 — End: 2023-07-27

## 2023-04-26 MED ORDER — BUPIVACAINE HCL (PF) 0.5 % IJ SOLN
INTRAMUSCULAR | Status: DC | PRN
  Administered 2023-04-26: 2.5 mL

## 2023-04-26 MED ORDER — EPINEPHRINE PF 1 MG/ML IJ SOLN
INTRAMUSCULAR | Status: AC
  Filled 2023-04-26: qty 1

## 2023-04-26 MED ORDER — TIZANIDINE HCL 4 MG PO TABS: 4.0000 mg | ORAL_TABLET | Freq: Four times a day (QID) | ORAL | Status: DC | PRN

## 2023-04-26 MED ORDER — SODIUM CHLORIDE 0.9 % IV SOLN: INTRAVENOUS | Status: DC

## 2023-04-26 MED ORDER — DOCUSATE SODIUM 100 MG PO CAPS
ORAL_CAPSULE | ORAL | Status: AC
  Filled 2023-04-26: qty 1

## 2023-04-26 MED ORDER — ONDANSETRON HCL 4 MG/2ML IJ SOLN: 4.0000 mg | Freq: Four times a day (QID) | INTRAMUSCULAR | Status: DC | PRN

## 2023-04-26 MED ORDER — METOCLOPRAMIDE HCL 5 MG/ML IJ SOLN
5.0000 mg | Freq: Three times a day (TID) | INTRAMUSCULAR | Status: DC | PRN
  Administered 2023-04-26: 10 mg via INTRAVENOUS

## 2023-04-26 MED ORDER — SODIUM CHLORIDE 0.9 % BOLUS PEDS
250.0000 mL | Freq: Once | INTRAVENOUS | Status: AC
  Administered 2023-04-26: 250 mL via INTRAVENOUS

## 2023-04-26 MED ORDER — BUPIVACAINE LIPOSOME 1.3 % IJ SUSP
INTRAMUSCULAR | Status: AC
  Filled 2023-04-26: qty 20

## 2023-04-26 MED ORDER — PHENYLEPHRINE HCL-NACL 20-0.9 MG/250ML-% IV SOLN
INTRAVENOUS | Status: AC
  Filled 2023-04-26: qty 250

## 2023-04-26 MED ORDER — ONDANSETRON HCL 4 MG/2ML IJ SOLN
INTRAMUSCULAR | Status: DC | PRN
  Administered 2023-04-26: 4 mg via INTRAVENOUS

## 2023-04-26 MED ORDER — FENTANYL CITRATE (PF) 100 MCG/2ML IJ SOLN
INTRAMUSCULAR | Status: AC
  Filled 2023-04-26: qty 2

## 2023-04-26 MED ORDER — KETOROLAC TROMETHAMINE 15 MG/ML IJ SOLN
15.0000 mg | Freq: Four times a day (QID) | INTRAMUSCULAR | Status: DC
  Administered 2023-04-26 – 2023-04-27 (×3): 15 mg via INTRAVENOUS

## 2023-04-26 MED ORDER — INSULIN ASPART 100 UNIT/ML IJ SOLN
0.0000 [IU] | Freq: Three times a day (TID) | INTRAMUSCULAR | Status: DC
  Administered 2023-04-27 (×2): 4 [IU] via SUBCUTANEOUS

## 2023-04-26 MED ORDER — METOCLOPRAMIDE HCL 5 MG/ML IJ SOLN
INTRAMUSCULAR | Status: AC
  Filled 2023-04-26: qty 2

## 2023-04-26 MED ORDER — TRANEXAMIC ACID 1000 MG/10ML IV SOLN
INTRAVENOUS | Status: DC | PRN
  Administered 2023-04-26: 1000 mg via TOPICAL

## 2023-04-26 MED ORDER — ATORVASTATIN CALCIUM 20 MG PO TABS
ORAL_TABLET | ORAL | Status: AC
  Filled 2023-04-26: qty 1

## 2023-04-26 MED ORDER — EPHEDRINE SULFATE-NACL 50-0.9 MG/10ML-% IV SOSY
PREFILLED_SYRINGE | INTRAVENOUS | Status: DC | PRN
  Administered 2023-04-26: 10 mg via INTRAVENOUS

## 2023-04-26 MED ORDER — LABETALOL HCL 5 MG/ML IV SOLN
INTRAVENOUS | Status: AC
  Filled 2023-04-26: qty 4

## 2023-04-26 MED ORDER — FAMOTIDINE 20 MG PO TABS
ORAL_TABLET | ORAL | Status: AC
  Filled 2023-04-26: qty 1

## 2023-04-26 MED ORDER — INSULIN ASPART 100 UNIT/ML IJ SOLN
INTRAMUSCULAR | Status: AC
  Filled 2023-04-26: qty 1

## 2023-04-26 MED ORDER — ACETAMINOPHEN 10 MG/ML IV SOLN
INTRAVENOUS | Status: DC | PRN
  Administered 2023-04-26: 1000 mg via INTRAVENOUS

## 2023-04-26 MED ORDER — METOCLOPRAMIDE HCL 10 MG PO TABS: 5.0000 mg | ORAL_TABLET | Freq: Three times a day (TID) | ORAL | Status: DC | PRN

## 2023-04-26 MED ORDER — CEFAZOLIN IN SODIUM CHLORIDE 3-0.9 GM/100ML-% IV SOLN
3.0000 g | Freq: Four times a day (QID) | INTRAVENOUS | Status: AC
  Administered 2023-04-26 – 2023-04-27 (×3): 3 g via INTRAVENOUS
  Filled 2023-04-26 (×4): qty 100

## 2023-04-26 MED ORDER — TRANEXAMIC ACID 1000 MG/10ML IV SOLN
INTRAVENOUS | Status: AC
  Filled 2023-04-26: qty 10

## 2023-04-26 MED ORDER — ROCURONIUM BROMIDE 10 MG/ML (PF) SYRINGE
PREFILLED_SYRINGE | INTRAVENOUS | Status: AC
  Filled 2023-04-26: qty 10

## 2023-04-26 MED ORDER — DIPHENHYDRAMINE HCL 12.5 MG/5ML PO ELIX: 12.5000 mg | ORAL_SOLUTION | ORAL | Status: DC | PRN

## 2023-04-26 MED ORDER — ONDANSETRON HCL 4 MG/2ML IJ SOLN
INTRAMUSCULAR | Status: AC
  Filled 2023-04-26: qty 6

## 2023-04-26 MED ORDER — TRIAMCINOLONE ACETONIDE 40 MG/ML IJ SUSP
INTRAMUSCULAR | Status: DC | PRN
  Administered 2023-04-26: 93 mL via PERIARTICULAR

## 2023-04-26 MED ORDER — LIDOCAINE HCL (PF) 2 % IJ SOLN
INTRAMUSCULAR | Status: AC
  Filled 2023-04-26: qty 5

## 2023-04-26 MED ORDER — APIXABAN 2.5 MG PO TABS: 2.5000 mg | ORAL_TABLET | Freq: Two times a day (BID) | ORAL | 0 refills | Status: DC

## 2023-04-26 MED ORDER — ACETAMINOPHEN 10 MG/ML IV SOLN
INTRAVENOUS | Status: AC
  Filled 2023-04-26: qty 100

## 2023-04-26 MED ORDER — ACETAMINOPHEN 500 MG PO TABS
1000.0000 mg | ORAL_TABLET | Freq: Four times a day (QID) | ORAL | Status: AC
  Administered 2023-04-26 – 2023-04-27 (×4): 1000 mg via ORAL

## 2023-04-26 MED ORDER — ACETAMINOPHEN 325 MG PO TABS: 325.0000 mg | ORAL_TABLET | Freq: Four times a day (QID) | ORAL | Status: DC | PRN

## 2023-04-26 MED ORDER — SODIUM CHLORIDE FLUSH 0.9 % IV SOLN
INTRAVENOUS | Status: AC
  Filled 2023-04-26: qty 40

## 2023-04-26 MED ORDER — ATORVASTATIN CALCIUM 20 MG PO TABS
40.0000 mg | ORAL_TABLET | Freq: Every day | ORAL | Status: DC
  Administered 2023-04-26 – 2023-04-27 (×2): 40 mg via ORAL

## 2023-04-26 MED ORDER — 0.9 % SODIUM CHLORIDE (POUR BTL) OPTIME
TOPICAL | Status: DC | PRN
  Administered 2023-04-26: 500 mL

## 2023-04-26 MED ORDER — ALBUTEROL SULFATE (2.5 MG/3ML) 0.083% IN NEBU: 2.5000 mg | INHALATION_SOLUTION | Freq: Four times a day (QID) | RESPIRATORY_TRACT | Status: DC | PRN

## 2023-04-26 MED ORDER — KETOROLAC TROMETHAMINE 30 MG/ML IJ SOLN
INTRAMUSCULAR | Status: AC
  Filled 2023-04-26: qty 1

## 2023-04-26 MED ORDER — OXYCODONE HCL 5 MG PO TABS: 5.0000 mg | ORAL_TABLET | Freq: Once | ORAL | Status: DC | PRN

## 2023-04-26 MED ORDER — CHLORHEXIDINE GLUCONATE 0.12 % MT SOLN
OROMUCOSAL | Status: AC
  Filled 2023-04-26: qty 15

## 2023-04-26 MED ORDER — LIDOCAINE HCL (CARDIAC) PF 100 MG/5ML IV SOSY
PREFILLED_SYRINGE | INTRAVENOUS | Status: DC | PRN
  Administered 2023-04-26: 40 mg via INTRAVENOUS

## 2023-04-26 MED ORDER — FENTANYL CITRATE (PF) 100 MCG/2ML IJ SOLN
25.0000 ug | INTRAMUSCULAR | Status: DC | PRN
  Administered 2023-04-26: 25 ug via INTRAVENOUS

## 2023-04-26 MED ORDER — EPHEDRINE 5 MG/ML INJ
INTRAVENOUS | Status: AC
  Filled 2023-04-26: qty 5

## 2023-04-26 MED ORDER — PHENYLEPHRINE HCL-NACL 20-0.9 MG/250ML-% IV SOLN
INTRAVENOUS | Status: DC | PRN
  Administered 2023-04-26: 20 ug/min via INTRAVENOUS

## 2023-04-26 MED ORDER — ALBUTEROL SULFATE HFA 108 (90 BASE) MCG/ACT IN AERS
INHALATION_SPRAY | RESPIRATORY_TRACT | Status: AC
  Filled 2023-04-26: qty 6.7

## 2023-04-26 MED ORDER — PHENYLEPHRINE HCL (PRESSORS) 10 MG/ML IV SOLN
INTRAVENOUS | Status: DC | PRN
  Administered 2023-04-26: 160 ug via INTRAVENOUS

## 2023-04-26 MED ORDER — DEXMEDETOMIDINE HCL IN NACL 80 MCG/20ML IV SOLN
INTRAVENOUS | Status: AC
  Filled 2023-04-26: qty 20

## 2023-04-26 MED ORDER — OXYCODONE HCL 5 MG PO TABS
ORAL_TABLET | ORAL | Status: AC
  Filled 2023-04-26: qty 1

## 2023-04-26 MED ORDER — DOCUSATE SODIUM 100 MG PO CAPS
100.0000 mg | ORAL_CAPSULE | Freq: Two times a day (BID) | ORAL | Status: DC
  Administered 2023-04-26 – 2023-04-27 (×2): 100 mg via ORAL

## 2023-04-26 MED ORDER — MIDAZOLAM HCL 5 MG/5ML IJ SOLN
INTRAMUSCULAR | Status: DC | PRN
  Administered 2023-04-26: 2 mg via INTRAVENOUS

## 2023-04-26 MED ORDER — OXYCODONE HCL 5 MG/5ML PO SOLN: 5.0000 mg | Freq: Once | ORAL | Status: DC | PRN

## 2023-04-26 MED ORDER — DEXAMETHASONE SODIUM PHOSPHATE 4 MG/ML IJ SOLN
INTRAMUSCULAR | Status: DC | PRN
  Administered 2023-04-26: 5 mg via INTRAVENOUS

## 2023-04-26 MED ORDER — PROPOFOL 500 MG/50ML IV EMUL
INTRAVENOUS | Status: DC | PRN
  Administered 2023-04-26: 80 ug/kg/min via INTRAVENOUS
  Administered 2023-04-26: 50 mg via INTRAVENOUS

## 2023-04-26 MED ORDER — SODIUM CHLORIDE 0.9 % IR SOLN
Status: DC | PRN
  Administered 2023-04-26: 3000 mL

## 2023-04-26 MED ORDER — SUCCINYLCHOLINE CHLORIDE 200 MG/10ML IV SOSY
PREFILLED_SYRINGE | INTRAVENOUS | Status: DC | PRN
  Administered 2023-04-26: 140 mg via INTRAVENOUS

## 2023-04-26 SURGICAL SUPPLY — 64 items
APL PRP STRL LF DISP 70% ISPRP (MISCELLANEOUS) ×1
BIT DRILL QUICK REL 1/8 2PK SL (BIT) IMPLANT
BLADE SAW SAG 25X90X1.19 (BLADE) ×1 IMPLANT
BLADE SURG SZ20 CARB STEEL (BLADE) ×1 IMPLANT
BNDG CMPR STD VLCR NS LF 5.8X6 (GAUZE/BANDAGES/DRESSINGS) ×1
BNDG ELASTIC 6X5.8 VLCR NS LF (GAUZE/BANDAGES/DRESSINGS) ×1 IMPLANT
BRNG TIB 71X10 ANT STAB MDLR (Insert) ×1 IMPLANT
CEMENT BONE R 1X40 (Cement) ×2 IMPLANT
CEMENT VACUUM MIXING SYSTEM (MISCELLANEOUS) ×1 IMPLANT
CHLORAPREP W/TINT 26 (MISCELLANEOUS) ×1 IMPLANT
COMP PAT 3 PEG SERIES A 31/6.2 (Miscellaneous) ×1 IMPLANT
COMPONENT PAT3 PEG SERS 31/6.2 (Miscellaneous) IMPLANT
COOLER POLAR GLACIER W/PUMP (MISCELLANEOUS) ×1 IMPLANT
COVER MAYO STAND REUSABLE (DRAPES) ×1 IMPLANT
CUFF TOURN SGL QUICK 24 (TOURNIQUET CUFF)
CUFF TOURN SGL QUICK 34 (TOURNIQUET CUFF)
CUFF TRNQT CYL 24X4X16.5-23 (TOURNIQUET CUFF) IMPLANT
CUFF TRNQT CYL 34X4.125X (TOURNIQUET CUFF) IMPLANT
DRAPE 3/4 80X56 (DRAPES) ×1 IMPLANT
DRAPE IMP U-DRAPE 54X76 (DRAPES) ×1 IMPLANT
DRAPE U-SHAPE 47X51 STRL (DRAPES) ×1 IMPLANT
DRSG MEPILEX SACRM 8.7X9.8 (GAUZE/BANDAGES/DRESSINGS) IMPLANT
DRSG OPSITE POSTOP 4X10 (GAUZE/BANDAGES/DRESSINGS) ×1 IMPLANT
DRSG OPSITE POSTOP 4X8 (GAUZE/BANDAGES/DRESSINGS) ×1 IMPLANT
ELECT CAUTERY BLADE 6.4 (BLADE) ×1 IMPLANT
ELECT REM PT RETURN 9FT ADLT (ELECTROSURGICAL) ×1
ELECTRODE REM PT RTRN 9FT ADLT (ELECTROSURGICAL) ×1 IMPLANT
FEMORAL CR LEFT 65MM (Joint) IMPLANT
GAUZE XEROFORM 1X8 LF (GAUZE/BANDAGES/DRESSINGS) ×1 IMPLANT
GLOVE BIO SURGEON STRL SZ7.5 (GLOVE) ×4 IMPLANT
GLOVE BIO SURGEON STRL SZ8 (GLOVE) ×4 IMPLANT
GLOVE BIOGEL PI IND STRL 8 (GLOVE) ×1 IMPLANT
GLOVE INDICATOR 8.0 STRL GRN (GLOVE) ×1 IMPLANT
GOWN STRL REUS W/ TWL LRG LVL3 (GOWN DISPOSABLE) ×1 IMPLANT
GOWN STRL REUS W/ TWL XL LVL3 (GOWN DISPOSABLE) ×1 IMPLANT
GOWN STRL REUS W/TWL LRG LVL3 (GOWN DISPOSABLE) ×1
GOWN STRL REUS W/TWL XL LVL3 (GOWN DISPOSABLE) ×1
HANDLE YANKAUER SUCT OPEN TIP (MISCELLANEOUS) ×1 IMPLANT
HOOD PEEL AWAY T7 (MISCELLANEOUS) ×3 IMPLANT
INSERT TIB BEARING 71 10 (Insert) IMPLANT
IV NS IRRIG 3000ML ARTHROMATIC (IV SOLUTION) ×1 IMPLANT
KIT TURNOVER KIT A (KITS) ×1 IMPLANT
MANIFOLD NEPTUNE II (INSTRUMENTS) ×1 IMPLANT
NDL SPNL 20GX3.5 QUINCKE YW (NEEDLE) ×1 IMPLANT
NEEDLE SPNL 20GX3.5 QUINCKE YW (NEEDLE) ×1 IMPLANT
NS IRRIG 1000ML POUR BTL (IV SOLUTION) ×1 IMPLANT
PACK TOTAL KNEE (MISCELLANEOUS) ×1 IMPLANT
PAD WRAPON POLAR KNEE (MISCELLANEOUS) ×1 IMPLANT
PENCIL SMOKE EVACUATOR (MISCELLANEOUS) ×1 IMPLANT
PLATE KNEE TIBIAL 71MM FIXED (Plate) IMPLANT
PULSAVAC PLUS IRRIG FAN TIP (DISPOSABLE) ×1
STAPLER SKIN PROX 35W (STAPLE) ×1 IMPLANT
SUCTION TUBE FRAZIER 10FR DISP (MISCELLANEOUS) ×1 IMPLANT
SUT VIC AB 0 CT1 36 (SUTURE) ×3 IMPLANT
SUT VIC AB 2-0 CT1 27 (SUTURE) ×3
SUT VIC AB 2-0 CT1 TAPERPNT 27 (SUTURE) ×3 IMPLANT
SYR 10ML LL (SYRINGE) ×1 IMPLANT
SYR 20ML LL LF (SYRINGE) ×1 IMPLANT
SYR 30ML LL (SYRINGE) IMPLANT
TIP FAN IRRIG PULSAVAC PLUS (DISPOSABLE) ×1 IMPLANT
TRAP FLUID SMOKE EVACUATOR (MISCELLANEOUS) ×2 IMPLANT
TRAY FOLEY SLVR 16FR LF STAT (SET/KITS/TRAYS/PACK) IMPLANT
WATER STERILE IRR 500ML POUR (IV SOLUTION) ×1 IMPLANT
WRAPON POLAR PAD KNEE (MISCELLANEOUS) ×1

## 2023-04-26 NOTE — Anesthesia Preprocedure Evaluation (Signed)
Anesthesia Evaluation  Patient identified by MRN, date of birth, ID band Patient awake    Reviewed: Allergy & Precautions, NPO status , Patient's Chart, lab work & pertinent test results  Airway Mallampati: III  TM Distance: >3 FB Neck ROM: full    Dental  (+) Chipped   Pulmonary asthma , sleep apnea , COPD,  COPD inhaler, former smoker   Pulmonary exam normal        Cardiovascular hypertension, negative cardio ROS Normal cardiovascular exam     Neuro/Psych  PSYCHIATRIC DISORDERS Anxiety Depression     Neuromuscular disease CVA, No Residual Symptoms    GI/Hepatic Neg liver ROS, PUD,GERD  Controlled,,  Endo/Other  diabetes, Type 2  Morbid obesityOzempic, last dose 5/27  Renal/GU Renal disease     Musculoskeletal   Abdominal   Peds  Hematology negative hematology ROS (+)   Anesthesia Other Findings Past Medical History: No date: Anemia     Comment:  vitamin d deficiency No date: Anxiety No date: Arthritis No date: Asthma     Comment:  WELL CONTROLLED 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     Comment:  skin cancer left ear No date: COPD (chronic obstructive pulmonary disease) (HCC) No date: Depression No date: Diabetes mellitus without complication (HCC) No date: Fatty liver No date: Hypertension No date: Kidney cysts     Comment:  per patient, never had No date: Renal disorder No date: Sleep apnea     Comment:  DOES NOT USE CPAP. waiting for new machine and a new               sleep study May or June 2019: Stroke [facility])     Comment:  TIA. no residual symptoms  Past Surgical History: No date: ABDOMINAL HYSTERECTOMY 08/09/2018: ANTERIOR CERVICAL DECOMP/DISCECTOMY FUSION; N/A     Comment:  Procedure: ANTERIOR CERVICAL DECOMPRESSION/DISCECTOMY               FUSION 1 LEVEL- C4-5;  Surgeon: Venetia Night, MD;                Location: [facility] ORS;  Service: Neurosurgery;  Laterality:               N/A; No date: BACK  SURGERY     Comment:  NECK 02/26/2021: COLONOSCOPY WITH PROPOFOL; N/A     Comment:  Procedure: COLONOSCOPY WITH PROPOFOL;  Surgeon: Wyline Mood, MD;  Location: [facility] ENDOSCOPY;  Service:               Endoscopy;  Laterality: N/A; 01/19/2021: DILATATION & CURETTAGE/HYSTEROSCOPY WITH MYOSURE; N/A     Comment:  Procedure: DILATATION & CURETTAGE/HYSTEROSCOPY;                Surgeon: Hildred Laser, MD;  Location: [facility] ORS;                Service: Gynecology;  Laterality: N/A; No date: DILATION AND CURETTAGE OF UTERUS 01/19/2021: ENDOMETRIAL ABLATION; N/A     Comment:  Procedure: ENDOMETRIAL ABLATION, MINERVA;  Surgeon:               Hildred Laser, MD;  Location: [facility] ORS;  Service:               Gynecology;  Laterality: N/A; No date: ENDOMETRIAL BIOPSY     Comment:  benign 08/24/2022: ESOPHAGOGASTRODUODENOSCOPY (EGD) WITH PROPOFOL; N/A     Comment:  Procedure: ESOPHAGOGASTRODUODENOSCOPY (  EGD) WITH               PROPOFOL;  Surgeon: Toney Reil, MD;  Location:               [facility] ENDOSCOPY;  Service: Gastroenterology;  Laterality:               N/A; 1992: EXPLORATORY LAPAROTOMY     Comment:  REMOVAL OF RUPTURED ECTOPIC 1998: HAND SURGERY; Right     Comment:  cyst removed 1975: HERNIA REPAIR     Comment:  UMBILICAL 2014: JOINT REPLACEMENT; Right     Comment:  TKR 2012: KNEE ARTHROSCOPY; Right 2014: KNEE SURGERY; Right     Comment:  total knee replacement 03/15/2022: ROBOTIC ASSISTED LAPAROSCOPIC HYSTERECTOMY AND  SALPINGECTOMY; Bilateral     Comment:  Procedure: XI ROBOTIC ASSISTED LAPAROSCOPIC               HYSTERECTOMY;  Surgeon: Hildred Laser, MD;  Location:               [facility] ORS;  Service: Gynecology;  Laterality: Bilateral; 12/14/2016: SHOULDER ARTHROSCOPY WITH BICEPSTENOTOMY; Left     Comment:  Procedure: SHOULDER ARTHROSCOPY WITH BICEPSTENOTOMY;                Surgeon: Christena Flake, MD;  Location: [facility] ORS;  Service:              Orthopedics;  Laterality:  Left; 12/14/2016: SHOULDER ARTHROSCOPY WITH OPEN ROTATOR CUFF REPAIR; Left     Comment:  Procedure: SHOULDER ARTHROSCOPY WITH OPEN ROTATOR CUFF               REPAIR AND ARTHROSCOPIC ROTATOR CUFF REPAIR;  Surgeon:               Christena Flake, MD;  Location: [facility] ORS;  Service:               Orthopedics;  Laterality: Left; 01/04/2019: SHOULDER ARTHROSCOPY WITH ROTATOR CUFF REPAIR; Right     Comment:  Procedure: SHOULDER ARTHROSCOPY WITH ROTATOR CUFF               REPAIR;  Surgeon: Christena Flake, MD;  Location: [facility] ORS;              Service: Orthopedics;  Laterality: Right; 12/14/2016: SHOULDER ARTHROSCOPY WITH SUBACROMIAL DECOMPRESSION; Left     Comment:  Procedure: SHOULDER ARTHROSCOPY WITH SUBACROMIAL               DECOMPRESSION;  Surgeon: Christena Flake, MD;  Location:               [facility] ORS;  Service: Orthopedics;  Laterality: Left; 01/04/2019: SHOULDER ARTHROSCOPY WITH SUBACROMIAL DECOMPRESSION AND  BICEP TENDON REPAIR; Right     Comment:  Procedure: SHOULDER ARTHROSCOPY WITH DEBRIDEMENT AND               SUBACROMIAL DECOMPRESSION-RIGHT;  Surgeon: Christena Flake,              MD;  Location: [facility] ORS;  Service: Orthopedics;                Laterality: Right; No date: TUBAL LIGATION  BMI    Body Mass Index: 51.17 kg/m      Reproductive/Obstetrics negative OB ROS                              Anesthesia Physical Anesthesia Plan  ASA: 3  Anesthesia Plan: Spinal   Post-op Pain Management: Toradol IV (intra-op)* and Ofirmev IV (intra-op)*   Induction:   PONV Risk Score and Plan: 2 and Propofol infusion, TIVA and Treatment may vary due to age or medical condition  Airway Management Planned: Natural Airway and Nasal Cannula  Additional Equipment:   Intra-op Plan:   Post-operative Plan:   Informed Consent: I have reviewed the patients History and Physical, chart, labs and discussed the procedure including the risks, benefits and alternatives for the  proposed anesthesia with the patient or authorized representative who has indicated his/her understanding and acceptance.     Dental Advisory Given  Plan Discussed with: Anesthesiologist, CRNA and Surgeon  Anesthesia Plan Comments: (Patient reports no bleeding problems and no anticoagulant use.  Plan for spinal with backup GA  Patient consented for risks of anesthesia including but not limited to:  - adverse reactions to medications - damage to eyes, teeth, lips or other oral mucosa - nerve damage due to positioning  - risk of bleeding, infection and or nerve damage from spinal that could lead to paralysis - risk of headache or failed spinal - damage to teeth, lips or other oral mucosa - sore throat or hoarseness - damage to heart, brain, nerves, lungs, other parts of body or loss of life  Patient voiced understanding.)         Anesthesia Quick Evaluation

## 2023-04-26 NOTE — Anesthesia Procedure Notes (Cosign Needed)
Procedure Name: LMA Insertion Date/Time: 04/26/2023 8:20 AM  Performed by: Louie Boston, MDPre-anesthesia Checklist: Patient identified, Emergency Drugs available, Suction available and Patient being monitored Oxygen Delivery Method: Circle system utilized Preoxygenation: Pre-oxygenation with 100% oxygen Induction Type: IV induction Ventilation: Mask ventilation with difficulty LMA: LMA inserted LMA Size: 5.0 Number of attempts: 2 Placement Confirmation: positive ETCO2 and breath sounds checked- equal and bilateral

## 2023-04-26 NOTE — Progress Notes (Signed)
PT Cancellation Note  Patient Details Name: ANGELL BELVIN MRN: [id number] DOB: January 01, 1966   Cancelled Treatment:    Reason Eval/Treat Not Completed: Medical issues which prohibited therapy: Per nursing pt continues to report limited sensation in her operative LE and pt stated that she does not feel ready for mobilization/discharge at this time.  Will attempt to see pt at a future date/time as medically appropriate.     Ovidio Hanger PT, DPT 04/26/23, 3:37 PM

## 2023-04-26 NOTE — Evaluation (Signed)
Physical Therapy Evaluation Patient Details Name: Brenda Hicks MRN: [id number] DOB: January 06, 1966 Today's Date: 04/26/2023  History of Present Illness  57 yo female admitted for L TKA.  Clinical Impression  Patient received in bed, she reports no pain, able to move B LEs in bed. Reports decreased sensation. Patient is independent with bed mobility. Min A needed for sit to stand from bed. Once standing patient reports it feels like she is standing on a sponge. Therefore we did not attempt ambulation at this time. She would like to see if she can go home today, so we will try to get back to her later if time allows. She can perform AP, LAQ and marching while seated edge of bed.  Patient will continue to benefit from skilled PT to improve strength, ROM and safety with mobility.      Recommendations for follow up therapy are one component of a multi-disciplinary discharge planning process, led by the attending physician.  Recommendations may be updated based on patient status, additional functional criteria and insurance authorization.  Follow Up Recommendations       Assistance Recommended at Discharge Intermittent Supervision/Assistance  Patient can return home with the following  A lot of help with walking and/or transfers;A little help with bathing/dressing/bathroom;Assist for transportation;Help with stairs or ramp for entrance    Equipment Recommendations None recommended by PT  Recommendations for Other Services       Functional Status Assessment Patient has had a recent decline in their functional status and demonstrates the ability to make significant improvements in function in a reasonable and predictable amount of time.     Precautions / Restrictions Precautions Precautions: Fall Restrictions Weight Bearing Restrictions: Yes LLE Weight Bearing: Weight bearing as tolerated      Mobility  Bed Mobility Overal bed mobility: Independent                   Transfers Overall transfer level: Needs assistance Equipment used: Rolling walker (2 wheels) Transfers: Sit to/from Stand Sit to Stand: From elevated surface, Min assist           General transfer comment: increased time and effort to get standing. Decreased sensation in L LE in standing, therefore returned to sitting edge of bed.    Ambulation/Gait               General Gait Details: did not attempt due to continued numbness in L LE  Stairs            Wheelchair Mobility    Modified Rankin (Stroke Patients Only)       Balance Overall balance assessment: Modified Independent                                           Pertinent Vitals/Pain Pain Assessment Pain Assessment: No/denies pain    Home Living Family/patient expects to be discharged to:: Private residence Living Arrangements: Children;Spouse/significant other Available Help at Discharge: Family;Available 24 hours/day Type of Home: Apartment Home Access: Level entry     Alternate Level Stairs-Number of Steps: 14, she can stay on main level if has a BSC. Home Layout: Two level;Bed/bath upstairs Home Equipment: Rolling Walker (2 wheels)      Prior Function Prior Level of Function : Independent/Modified Independent;Driving             Mobility Comments: reports some falls due to knee  giving way. ADLs Comments: independent     Hand Dominance        Extremity/Trunk Assessment   Upper Extremity Assessment Upper Extremity Assessment: Overall WFL for tasks assessed    Lower Extremity Assessment Lower Extremity Assessment: LLE deficits/detail LLE Sensation: decreased proprioception;decreased light touch    Cervical / Trunk Assessment Cervical / Trunk Assessment: Normal  Communication   Communication: No difficulties  Cognition Arousal/Alertness: Awake/alert Behavior During Therapy: WFL for tasks assessed/performed Overall Cognitive Status: Within Functional  Limits for tasks assessed                                          General Comments      Exercises     Assessment/Plan    PT Assessment Patient needs continued PT services  PT Problem List Decreased strength;Decreased activity tolerance;Decreased balance;Decreased mobility;Obesity;Impaired sensation       PT Treatment Interventions DME instruction;Gait training;Therapeutic exercise;Balance training;Stair training;Functional mobility training;Therapeutic activities;Patient/family education;Neuromuscular re-education;Modalities    PT Goals (Current goals can be found in the Care Plan section)  Acute Rehab PT Goals Patient Stated Goal: to return home PT Goal Formulation: With patient Time For Goal Achievement: 04/30/23 Potential to Achieve Goals: Good    Frequency BID     Co-evaluation               AM-PAC PT "6 Clicks" Mobility  Outcome Measure Help needed turning from your back to your side while in a flat bed without using bedrails?: None Help needed moving from lying on your back to sitting on the side of a flat bed without using bedrails?: None Help needed moving to and from a bed to a chair (including a wheelchair)?: A Lot Help needed standing up from a chair using your arms (e.g., wheelchair or bedside chair)?: A Little Help needed to walk in hospital room?: Total Help needed climbing 3-5 steps with a railing? : Total 6 Click Score: 15    End of Session   Activity Tolerance: Other (comment) (limited by decreased sensation post op) Patient left: in bed;with call bell/phone within reach Nurse Communication: Mobility status PT Visit Diagnosis: Difficulty in walking, not elsewhere classified (R26.2);Muscle weakness (generalized) (M62.81);Other abnormalities of gait and mobility (R26.89);Unsteadiness on feet (R26.81)    Time: 1610-9604 PT Time Calculation (min) (ACUTE ONLY): 23 min   Charges:   PT Evaluation $PT Eval Moderate Complexity: 1  Mod PT Treatments $Therapeutic Activity: 8-22 mins        Jakub Debold, PT, GCS 04/26/23,2:35 PM

## 2023-04-26 NOTE — H&P (Signed)
History of Present Illness: Brenda Hicks is a 57 y.o.female who is being referred by Horris Latino, PA-C, for left knee pain. The symptoms began several years ago and developed without any specific cause or injury. Her symptoms worsened about 6 months ago, prompting her to see Horris Latino, PA-C. He has treated her with physical therapy, as well as steroid and viscosupplementation injections, with limited benefit. Therefore, the patient has been referred to be to discuss alternative treatment options. She reports 10/10 pain on today's visit. The pain is located along the lateral and medial aspect of the knee. In addition, she experiences significant pain posteriorly. The pain is described as aching, dull, stabbing, and throbbing. The symptoms are aggravated constantly, with normal daily activities, with sleeping, using stairs, at higher levels of activity, rising from a chair, walking, and standing. She also describes occasional episodes of giving way. She does not ambulate with any assistive devices but finds that she has to walk quite slowly due to her level of pain. She is unable to reciprocate stairs. She has associated swelling and no deformity. She has tried muscle relaxers, acetaminophen, anti-inflammatories, steroid injections, viscosupplementation injections, physical therapy, a home exercise program, ice, and heat with limited benefit. Of note, the patient is now over 10 years status post a right total knee arthroplasty for degenerative joint disease of her right knee.  Current Outpatient Medications: ACCU-CHEK SOFTCLIX LANCETS lancets Reported on 01/12/2016 11  docusate (COLACE) 100 MG capsule Take 100 mg by mouth 2 (two) times daily as needed  DULoxetine (CYMBALTA) 30 MG DR capsule Take by mouth  gabapentin (NEURONTIN) 300 MG capsule Take 1 capsule by mouth 3 (three) times daily  HUMALOG U-100 INSULIN injection (concentration 100 units/mL) Inject 30 Units subcutaneously 3 (three) times daily  with meals 10 mL 5  insulin GLARGINE (LANTUS) injection (concentration 100 units/mL) Inject 100 Units subcutaneously nightly. 30 mL 11  lidocaine (LIDODERM) 5 % patch Place onto the skin  lisinopriL (ZESTRIL) 40 MG tablet Take 40 mg by mouth at bedtime  naproxen sodium (ALEVE) 220 MG tablet Take by mouth 4 tablets 4 times a day  ondansetron (ZOFRAN) 4 MG tablet Take by mouth Take 1 tablet by mouth every 8 (eight) hours as needed for vomiting or nausea.  pen needle, diabetic (BD ULTRA-FINE ORIG PEN NEEDLE) 29 gauge x 1/2" needle USE FOUR TIMES DAILY BEFORE MEALS AND AT NIGHT 400 each 3  PROVENTIL HFA 90 mcg/actuation inhaler Inhale 2 inhalations into the lungs every 6 (six) hours as needed 4  tiZANidine (ZANAFLEX) 4 MG tablet Take 1 tablet (4 mg total) by mouth 3 (three) times daily as needed 90 tablet 0   Allergies:  Morphine Nausea And Vomiting  Tramadol Other (Nausea. If she takes antinausea medicine with this medicine, then she can tolerate it)   Past Medical History:  Abnormal Pap smear 04/2011 (more than 2 abn past paps. atypia or inflammation. cx biopsy 2012. colpo 2011. PAP did not revert to normal)  Arthritis of bilateral knees and hands  Asthma without status asthmaticus  Cancer (CMS-HCC) - left ear  COPD (chronic obstructive pulmonary disease) (CMS-HCC)  DJD (degenerative joint disease) of cervical spine  Endometrial polyp  Fatty liver  GERD (gastroesophageal reflux disease)  Hyperlipidemia  Hypertension  Kidney cysts  Migraine headache  Neuropathy  Obesity  Obstetric anesthesia problems  Renal disorder  Sleep apnea  Stroke (CMS-HCC)  Type 2 diabetes mellitus (CMS-HCC)  Uterine fibroid   Past Surgical History:  OTHER SURGERY  1974 (Hernia Repair)  TUBAL LIGATION 1991  EXPLORATORY LAPAROTOMY 1991 (removal of ruptured ectopic)  DILATION AND CURETTAGE OF UTERUS 06/02/2007  ENDOMETRIAL BIOPSY 04/30/2011  Limited arthroscopic debridmeent, arthroscopic subscapularis tendon  repair, arthroscopic biceps tendon lysis arthroscpoic subacromial decompression and mini-open rotator cuff repair, left shoulder Left 12/14/2016 (Dr. Joice Lofts)  C4-5 ACDF 08/09/2018 (Dr Venetia Night at [facility])  Extensive arthroscopic debridement with biceps tenolysis, arthroscopic subacromial decompression and mini-open rotator cuff repair using a smith and nephew regeneten patch, right shoulder Right 01/04/2019 (Dr. Joice Lofts)  hand surgery  HERNIA REPAIR (umbilical)  JOINT REPLACEMENT Right TKR  KNEE ARTHROSCOPY  OTHER SURGERY  Arthroscopy  SPINE SURGERY   Family History:  High blood pressure (Hypertension) Mother  Lupus Mother  Thyroid disease Mother  Colon cancer Mother  Depression Paternal Grandmother  Colon cancer Maternal Grandfather  Anxiety Maternal Grandmother  Prostate cancer Paternal Grandfather  Obesity Paternal Aunt   Social History:   Socioeconomic History:  Marital status: Married  Tobacco Use  Smoking status: Former  Packs/day: 3.00  Years: 24.00  Additional pack years: 0.00  Total pack years: 72.00  Types: Cigarettes  Quit date: 06/09/2017  Years since quitting: 5.5  Smokeless tobacco: Never  Vaping Use  Vaping Use: Never used  Substance and Sexual Activity  Alcohol use: No  Alcohol/week: 0.0 standard drinks of alcohol  Drug use: No  Sexual activity: Yes  Partners: Male  Birth control/protection: Injection, I.U.D., None  Other Topics Concern  Would you please tell us about the people who live in your home, your pets, or anything else important to your social life? Yes  Social History Narrative  ** Merged History Encounter **   Review of Systems:  A comprehensive 14 point ROS was performed, reviewed, and the pertinent orthopaedic findings are documented in the HPI.  Physical Exam: Vitals:  01/03/23 1106  BP: (!) 132/92  Weight: (!) 144.7 kg (319 lb)  Height: 167.6 cm (5\' 6" )  PainSc: 10-Worst pain ever  PainLoc: Knee   General/Constitutional:  Significantly overweight middle-aged female in no acute distress. Neuro/Psych: Normal mood and affect, oriented to person, place and time. Eyes: Non-icteric. Pupils are equal, round, and reactive to light, and exhibit synchronous movement. Lymphatic: No palpable adenopathy. Respiratory: Lungs clear to auscultation, Normal chest excursion, No wheezes, and Non-labored breathing Cardiovascular: Regular rate and rhythm. No murmurs. and No edema, swelling or tenderness, except as noted in detailed exam. Vascular: No edema, swelling or tenderness, except as noted in detailed exam. Integumentary: No impressive skin lesions present, except as noted in detailed exam. Musculoskeletal: Unremarkable, except as noted in detailed exam.  Left knee exam: GAIT: antalgic gait, but uses no assistive devices. ALIGNMENT: mild varus SKIN: unremarkable SWELLING: mild EFFUSION: small WARMTH: no warmth TENDERNESS: moderate over the medial and lateral joint lines more severe pain posteriorly ROM: 0 to 95 degrees with pain in maximal flexion McMURRAY'S: equivocal PATELLOFEMORAL: normal tracking with no peri-patellar tenderness and negative apprehension sign CREPITUS: Mild patellofemoral crepitance LACHMAN'S: negative PIVOT SHIFT: Not evaluated ANTERIOR DRAWER: negative POSTERIOR DRAWER: negative VARUS/VALGUS: Mildly positive pseudolaxity to varus stressing  Grossly, she is neurovascularly intact to the left lower extremity and foot.  Knee Imaging: Recent AP, lateral, and oblique views of the left knee are available for review and have been reviewed by myself. These films demonstrate moderate degenerative changes, primarily involving the medial compartment with 70% medial joint space narrowing. Overall alignment is neutral. No fractures, lytic lesions, or abnormal calcifications are noted.  Assessment:  1. Primary osteoarthritis of left knee.   Plan: The treatment options were discussed with the patient. In  addition, patient educational materials were provided regarding the diagnosis and treatment options. The patient is quite frustrated by her symptoms and functional limitations, and is ready to consider more aggressive treatment options. Therefore, I have recommended a surgical procedure, specifically a left total knee arthroplasty. The procedure was discussed with the patient, as were the potential risks (including bleeding, infection, nerve and/or blood vessel injury, persistent or recurrent pain, loosening and/or failure of the components, dislocation, need for further surgery, blood clots, strokes, heart attacks and/or arhythmias, pneumonia, etc.) and benefits. The patient states his/her understanding and wishes to proceed. All of the patient's questions and concerns were answered. She can call any time with further concerns. She will follow up post-surgery, routine.    H&P reviewed and patient re-examined. No changes.

## 2023-04-26 NOTE — Anesthesia Procedure Notes (Cosign Needed)
Spinal  Patient location during procedure: OR Start time: 04/26/2023 7:55 AM Reason for block: surgical anesthesia Staffing Resident/CRNA: Lynden Oxford, CRNA Performed by: Louie Boston, MD Authorized by: Louie Boston, MD   Preanesthetic Checklist Completed: patient identified, IV checked, site marked, risks and benefits discussed, surgical consent, monitors and equipment checked, pre-op evaluation and timeout performed Spinal Block Patient position: sitting Prep: DuraPrep Patient monitoring: heart rate, cardiac monitor, continuous pulse ox and blood pressure Approach: midline Location: L3-4 Injection technique: single-shot Needle Needle type: Sprotte and Pencan  Needle gauge: 24 G Needle length: 9 cm Assessment Sensory level: T4 Events: CSF return

## 2023-04-26 NOTE — Discharge Instructions (Addendum)
Orthopedic discharge instructions: May shower with intact OpSite dressing. Apply ice frequently to knee or use Polar Care. Start Eliquis 1 tablet (2.5 mg) twice daily on Wednesday, 04/27/2023, for 2 weeks, then take aspirin 325 mg twice daily for 4 weeks. Take pain medication as prescribed when needed.  May supplement with ES Tylenol if necessary. May weight-bear as tolerated on right leg - use walker for balance and support. Follow-up in 10-14 days or as scheduled.  AMBULATORY SURGERY  DISCHARGE INSTRUCTIONS   The drugs that you were given will stay in your system until tomorrow so for the next 24 hours you should not:  Drive an automobile Make any legal decisions Drink any alcoholic beverage   You may resume regular meals tomorrow.  Today it is better to start with liquids and gradually work up to solid foods.  You may eat anything you prefer, but it is better to start with liquids, then soup and crackers, and gradually work up to solid foods.   Please notify your doctor immediately if you have any unusual bleeding, trouble breathing, redness and pain at the surgery site, drainage, fever, or pain not relieved by medication.    Additional Instructions: PLEASE LEAVE EXPAREL (TEAL) ARMBAND ON FOR 4 DAYS     Please contact your physician with any problems or Same Day Surgery at [phone], Monday through Friday 6 am to 4 pm, or [place] [facility] [place] number at [phone].  POLAR CARE INFORMATION  [email or web address]  How to use [employer] Polar Care [facility] Therapy System?  YouTube   [email or web address]  OPERATING INSTRUCTIONS  Start the product With dry hands, connect the transformer to the electrical connection located on the top of the cooler. Next, plug the transformer into an appropriate electrical outlet. The unit will automatically start running at this point.  To stop the pump, disconnect electrical power.  Unplug to stop the product when  not in use. Unplugging the Polar Care unit turns it off. Always unplug immediately after use. Never leave it plugged in while unattended. Remove pad.    FIRST ADD WATER TO FILL LINE, THEN ICE---Replace ice when existing ice is almost melted  1 Discuss Treatment with your Licensed Health Care Practitioner and Use Only as Prescribed 2 Apply Insulation Barrier & Cold Therapy Pad 3 Check for Moisture 4 Inspect Skin Regularly  Tips and Trouble Shooting Usage Tips 1. Use cubed or chunked ice for optimal performance. 2. It is recommended to drain the Pad between uses. To drain the pad, hold the Pad upright with the hose pointed toward the ground. Depress the black plunger and allow water to drain out. 3. You may disconnect the Pad from the unit without removing the pad from the affected area by depressing the silver tabs on the hose coupling and gently pulling the hoses apart. The Pad and unit will seal itself and will not leak. Note: Some dripping during release is normal. 4. DO NOT RUN PUMP WITHOUT WATER! The pump in this unit is designed to run with water. Running the unit without water will cause permanent damage to the pump. 5. Unplug unit before removing lid.  TROUBLESHOOTING GUIDE Pump not running, Water not flowing to the pad, Pad is not getting cold 1. Make sure the transformer is plugged into the wall outlet. 2. Confirm that the ice and water are filled to the indicated levels. 3. Make sure there are no kinks in the pad. 4. Gently pull on the blue tube to make sure  the tube/pad junction is straight. 5. Remove the pad from the treatment site and ll it while the pad is lying at; then reapply. 6. Confirm that the pad couplings are securely attached to the unit. Listen for the double clicks (Figure 1) to confirm the pad couplings are securely attached.  Leaks    Note: Some condensation on the lines, controller, and pads is unavoidable, especially in warmer climates. 1. If using a B[employer] Polar  Care Cold Therapy unit with a detachable Cold Therapy Pad, and a leak exists (other than condensation on the lines) disconnect the pad couplings. Make sure the silver tabs on the couplings are depressed before reconnecting the pad to the pump hose; then confirm both sides of the coupling are properly clicked in. 2. If the coupling continues to leak or a leak is detected in the pad itself, stop using it and call [employer] Customer Care at [phone].  Cleaning After use, empty and dry the unit with a soft cloth. Warm water and mild detergent may be used occasionally to clean the pump and tubes.  WARNING: The Polar Care Cube can be cold enough to cause serious injury, including full skin necrosis. Follow these Operating Instructions, and carefully read the Product Insert (see pouch on side of unit) and the Cold Therapy Pad Fitting Instructions (provided with each Cold Therapy Pad) prior to use.

## 2023-04-26 NOTE — Transfer of Care (Cosign Needed)
Immediate Anesthesia Transfer of Care Note  Patient: Brenda Hicks  Procedure(s) Performed: TOTAL KNEE ARTHROPLASTY (Left: Knee)  Patient Location: PACU  Anesthesia Type:General and Spinal  Level of Consciousness: awake and drowsy  Airway & Oxygen Therapy: Patient Spontanous Breathing and Patient connected to face mask oxygen  Post-op Assessment: Report given to RN and Post -op Vital signs reviewed and stable  Post vital signs: Reviewed and stable.  Last Vitals:  Vitals Value Taken Time  BP 147/92 04/26/23 1025  Temp    Pulse 81 04/26/23 1027  Resp 21 04/26/23 1027  SpO2 100 % 04/26/23 1027  Vitals shown include unvalidated device data.  Last Pain:  Vitals:   04/26/23 0639  TempSrc: Tympanic  PainSc: 10-Worst pain ever      Patients Stated Pain Goal: 2 (04/26/23 0981)  Complications: No notable events documented.

## 2023-04-26 NOTE — Anesthesia Procedure Notes (Cosign Needed)
Procedure Name: Intubation Date/Time: 04/26/2023 8:31 AM  Performed by: Louie Boston, MDPre-anesthesia Checklist: Patient identified, Emergency Drugs available, Suction available and Patient being monitored Patient Re-evaluated:Patient Re-evaluated prior to induction Oxygen Delivery Method: Circle system utilized Preoxygenation: Pre-oxygenation with 100% oxygen Induction Type: IV induction Ventilation: Mask ventilation without difficulty Laryngoscope Size: Quintella Reichert and 3 Grade View: Grade II Tube type: Oral Tube size: 7.0 mm Number of attempts: 1 Airway Equipment and Method: Stylet and Oral airway Placement Confirmation: ETT inserted through vocal cords under direct vision, positive ETCO2 and breath sounds checked- equal and bilateral Secured at: 21 cm Tube secured with: Tape Dental Injury: Teeth and Oropharynx as per pre-operative assessment

## 2023-04-26 NOTE — Op Note (Signed)
04/26/2023  10:27 AM  Patient:   Brenda Hicks  Pre-Op Diagnosis:   Degenerative joint disease, left knee.  Post-Op Diagnosis:   Same  Procedure:   Left TKA using all-cemented Biomet Vanguard system with a 65 mm PCR femur, a 71 mm tibial tray with a 10 mm anterior stabilized E-poly insert, and a 31 x 6.2 mm all-poly 3-pegged domed patella.  Surgeon:   Maryagnes Amos, MD  Assistant:   Horris Latino, PA-C   Anesthesia:   Spinal --> GET  Findings:   As above  Complications:   None  EBL:   5 cc  Fluids:   1000 cc crystalloid  UOP:   Incontinent  TT:   100 minutes at 300 mmHg  Drains:   None  Closure:   Staples  Implants:   As above  Brief Clinical Note:   The patient is a 57 year old female with a long history of progressively worsening left knee pain. The patient's symptoms have progressed despite medications, activity modification, injections, etc. The patient's history and examination were consistent with advanced degenerative joint disease of the left knee confirmed by plain radiographs. The patient presents at this time for a left total knee arthroplasty.  Procedure:   The patient was brought into the operating room. After adequate spinal anesthesia was obtained, the patient was lain in the supine position before the left lower extremity was prepped with ChloraPrep solution and draped sterilely. Preoperative antibiotics were administered. A timeout was performed to verify the appropriate surgical site before the limb was exsanguinated with an Esmarch and the tourniquet inflated to 300 mmHg.   A standard anterior approach to the knee was made through an approximately 7 inch incision. The incision was carried down through the subcutaneous tissues to expose superficial retinaculum. This was split the length of the incision and the medial flap elevated sufficiently to expose the medial retinaculum. The medial retinaculum was incised, leaving a 3-4 mm cuff of tissue on the  patella. This was extended distally along the medial border of the patellar tendon and proximally through the medial third of the quadriceps tendon. A subtotal fat pad excision was performed before the soft tissues were elevated off the anteromedial and anterolateral aspects of the proximal tibia to the level of the collateral ligaments. The anterior portions of the medial and lateral menisci were removed, as was the anterior cruciate ligament. With the knee flexed to 90, the external tibial guide was positioned and the appropriate proximal tibial cut made. This piece was taken to the back table where it was measured and found to be optimally replicated by a 71 mm component.  Attention was directed to the distal femur. The intramedullary canal was accessed through a 3/8" drill hole. The intramedullary guide was inserted and positioned in order to obtain a neutral flexion gap. The intercondylar block was positioned with care taken to avoid notching the anterior cortex of the femur. The appropriate cut was made. Next, the distal cutting block was placed at 5 of valgus alignment. Using the 9 mm slot, the distal cut was made. The distal femur was measured and found to be optimally replicated by the 65 mm component. The 65 mm 4-in-1 cutting block was positioned and first the posterior, then the posterior chamfer, the anterior chamfer, and finally the anterior cuts were made. At this point, the posterior portions medial and lateral menisci were removed. A trial reduction was performed using the appropriate femoral and tibial components with the 10 mm  insert. This demonstrated excellent stability to varus and valgus stressing both in flexion and extension while permitting full extension. Patella tracking was assessed and found to be excellent. Therefore, the tibial guide position was marked on the proximal tibia. The patella thickness was measured and found to be 18 mm. Therefore, the appropriate cut was made. The  patellar surface was measured and found to be optimally replicated by the 31 mm component. The three peg holes were drilled in place before the trial button was inserted. Patella tracking was assessed and found to be excellent, passing the "no thumb test". The lug holes were drilled into the distal femur before the trial component was removed, leaving only the tibial tray. The keel was then created using the appropriate tower, reamer, and punch.  The bony surfaces were prepared for cementing by irrigating them thoroughly with sterile saline solution via the jet lavage system. A bone plug was fashioned from some of the bone that had been removed previously and used to plug the distal femoral canal. In addition, a "cocktail" of 20 cc of Exparel, 30 cc of 0.5% Sensorcaine, 2 cc of Kenalog 40 (80 mg), and 30 mg of Toradol diluted out to 90 cc with normal saline was injected into the postero-medial and postero-lateral aspects of the knee, the medial and lateral gutter regions, and the peri-incisional tissues to help with postoperative analgesia. Meanwhile, the cement was being mixed on the back table. When it was ready, the tibial tray was cemented in first. The excess cement was removed using Personal assistant. Next, the femoral component was impacted into place. Again, the excess cement was removed using Personal assistant. The 10 mm trial insert was positioned and the knee brought into extension while the cement hardened. Finally, the patella was cemented into place and secured using the patellar clamp. Again, the excess cement was removed using Personal assistant. Once the cement had hardened, the knee was placed through a range of motion with the findings as described above. Therefore, the trial insert was removed and, after verifying that no cement had been retained posteriorly, the permanent 10 mm anterior stabilized E-polyethylene insert was positioned and secured using the appropriate key locking mechanism. Again the  knee was placed through a range of motion with the findings as described above.  The wound was copiously irrigated with sterile saline solution using the jet lavage system before the quadriceps tendon and retinacular layer were reapproximated using #0 Vicryl interrupted sutures. The superficial retinacular layer also was closed using a running #0 Vicryl suture. A total of 10 cc of transexemic acid (TXA) was injected intra-articularly before the subcutaneous tissues were closed in several layers using 2-0 Vicryl interrupted sutures. The skin was closed using staples. A sterile honeycomb dressing was applied to the skin before the leg was wrapped with an Ace wrap to accommodate the Polar Care device. The patient was then awakened and returned to the recovery room in satisfactory condition after tolerating the procedure well.

## 2023-04-27 ENCOUNTER — Encounter: Payer: Self-pay | Admitting: Surgery

## 2023-04-27 DIAGNOSIS — Z96652 Presence of left artificial knee joint: Secondary | ICD-10-CM

## 2023-04-27 DIAGNOSIS — M1712 Unilateral primary osteoarthritis, left knee: Secondary | ICD-10-CM | POA: Diagnosis not present

## 2023-04-27 LAB — GLUCOSE, CAPILLARY
Glucose-Capillary: 192 mg/dL — ABNORMAL HIGH (ref 70–99)
Glucose-Capillary: 162 mg/dL — ABNORMAL HIGH (ref 70–99)

## 2023-04-27 MED ORDER — ACETAMINOPHEN 500 MG PO TABS
ORAL_TABLET | ORAL | Status: AC
  Filled 2023-04-27: qty 2

## 2023-04-27 MED ORDER — APIXABAN 2.5 MG PO TABS: 2.5000 mg | ORAL_TABLET | Freq: Two times a day (BID) | ORAL | 0 refills | Status: DC

## 2023-04-27 MED ORDER — ONDANSETRON HCL 4 MG PO TABS: 4.0000 mg | ORAL_TABLET | Freq: Four times a day (QID) | ORAL | 0 refills | Status: DC | PRN

## 2023-04-27 MED ORDER — INSULIN ASPART 100 UNIT/ML IJ SOLN
INTRAMUSCULAR | Status: AC
  Filled 2023-04-27: qty 1

## 2023-04-27 MED ORDER — ATORVASTATIN CALCIUM 20 MG PO TABS
ORAL_TABLET | ORAL | Status: AC
  Filled 2023-04-27: qty 2

## 2023-04-27 MED ORDER — KETOROLAC TROMETHAMINE 15 MG/ML IJ SOLN
INTRAMUSCULAR | Status: AC
  Filled 2023-04-27: qty 1

## 2023-04-27 MED ORDER — DOCUSATE SODIUM 100 MG PO CAPS
ORAL_CAPSULE | ORAL | Status: AC
  Filled 2023-04-27: qty 1

## 2023-04-27 MED ORDER — OXYCODONE HCL 5 MG PO TABS
ORAL_TABLET | ORAL | Status: AC
  Filled 2023-04-27: qty 1

## 2023-04-27 MED ORDER — LISINOPRIL 20 MG PO TABS
40.0000 mg | ORAL_TABLET | Freq: Every day | ORAL | Status: DC
  Administered 2023-04-27: 40 mg via ORAL

## 2023-04-27 MED ORDER — OXYCODONE HCL 5 MG PO TABS: 5.0000 mg | ORAL_TABLET | ORAL | 0 refills | Status: DC | PRN

## 2023-04-27 MED ORDER — APIXABAN 2.5 MG PO TABS
ORAL_TABLET | ORAL | Status: AC
  Filled 2023-04-27: qty 1

## 2023-04-27 NOTE — Progress Notes (Signed)
  Subjective: 1 Day Post-Op Procedure(s) (LRB): TOTAL KNEE ARTHROPLASTY (Left) Patient reports pain as mild.   Patient is well, and has had no acute complaints or problems Plan is to go Home after hospital stay. Negative for chest pain and shortness of breath Fever: no Gastrointestinal:Negative for nausea and vomiting Does report some tingling in her foot, h/o diabetic neuropathy as well.  Objective: Vital signs in last 24 hours: Temp:  [97.1 F (36.2 C)-98 F (36.7 C)] 97.1 F (36.2 C) (06/05 0729) Pulse Rate:  [56-79] 56 (06/05 0729) Resp:  [14-22] 17 (06/05 0729) BP: (112-182)/(69-97) 141/77 (06/05 0729) SpO2:  [96 %-100 %] 96 % (06/05 0729)  Intake/Output from previous day:  Intake/Output Summary (Last 24 hours) at 04/27/2023 0740 Last data filed at 04/27/2023 0643 Gross per 24 hour  Intake 2372.58 ml  Output 2380 ml  Net -7.42 ml    Intake/Output this shift: No intake/output data recorded.  Labs: No results for input(s): "HGB" in the last 72 hours. No results for input(s): "WBC", "RBC", "HCT", "PLT" in the last 72 hours. No results for input(s): "NA", "K", "CL", "CO2", "BUN", "CREATININE", "GLUCOSE", "CALCIUM" in the last 72 hours. No results for input(s): "LABPT", "INR" in the last 72 hours.   EXAM General - Patient is Alert, Appropriate, and Oriented Extremity - ABD soft Sensation intact distally Intact pulses distally Dorsiflexion/Plantar flexion intact Incision: scant drainage No cellulitis present Compartment soft Dressing/Incision - Mild bloody drainage noted to the left knee honeycomb dressing Motor Function - intact, moving foot and toes well on exam.  Abdomen soft with intact bowel sounds this morning.  Past Medical History:  Diagnosis Date   Anemia    vitamin d deficiency   Anxiety    Arthritis    Asthma    WELL CONTROLLED   Cancer of ear    skin cancer left ear   COPD (chronic obstructive pulmonary disease) (HCC)    Depression    Diabetes  mellitus without complication (HCC)    Fatty liver    Hypertension    Kidney cysts    per patient, never had   Renal disorder    Sleep apnea    DOES NOT USE CPAP. waiting for new machine and a new sleep study   Stroke [facility]) May or June 2019   TIA. no residual symptoms    Assessment/Plan: 1 Day Post-Op Procedure(s) (LRB): TOTAL KNEE ARTHROPLASTY (Left) Principal Problem:   Status post total knee replacement using cement, left  Estimated body mass index is 51.17 kg/m as calculated from the following:   Height as of this encounter: 5\' 6"  (1.676 m).   Weight as of this encounter: 143.8 kg. Advance diet Up with therapy D/C IV fluids when tolerating po intake.  Vitals reviewed this AM, BP 141/77.  Patient on Lisinopril at home, added to hospital regimen. Up with therapy today.  Continue to work on BM. Patient reports some numbness in foot, h/o diabetic neuropathy.  Reports sensation is intact to light touch on exam, full motion. Plan for possible d/c home today pending progress with PT.  DVT Prophylaxis - TED hose and Eliquis Weight-Bearing as tolerated to left leg  J. Horris Latino, PA-C [facility] 04/27/2023, 7:40 AM

## 2023-04-27 NOTE — TOC Progression Note (Addendum)
Transition of Care [facility]) - Progression Note    Patient Details  Name: BIANCCA BEBBER MRN: [id number] Date of Birth: 09/01/1966  Transition of Care [facility]) CM/SW Contact  Marlowe Sax, RN Phone Number: 04/27/2023, 10:52 AM  Clinical Narrative:   Provided the patient with an Eliquis coupon She will get the bariatric 3in1 delivered by [facility], She will do Outpatient PT  [employer] delivered a regular size 3 in1, they are out of the Bariatric and will deliver to her home in a couple of days, [employer] also is out of Bariatric 3 in1 as in [facility]tech       Expected Discharge Plan and Services         Expected Discharge Date: 04/26/23                                     Social Determinants of Health (SDOH) Interventions SDOH Screenings   Food Insecurity: Food Insecurity Present (04/26/2023)  Housing: Low Risk  (04/26/2023)  Transportation Needs: No Transportation Needs (04/26/2023)  Utilities: Not At Risk (04/26/2023)  Alcohol Screen: Low Risk  (12/16/2022)  Depression (PHQ2-9): High Risk (02/17/2023)  Financial Resource Strain: Low Risk  (02/16/2023)  Physical Activity: Unknown (02/16/2023)  Social Connections: Moderately Isolated (02/16/2023)  Stress: No Stress Concern Present (02/16/2023)  Tobacco Use: Medium Risk (04/26/2023)    Readmission Risk Interventions     No data to display

## 2023-04-27 NOTE — Progress Notes (Signed)
The patient was discharged via wheelchair with nursing staff to personal vehicle. Patient received by spouse. Patient discharged with regular commode that will be exchanged via home health for bariatric commode tomorrow. Patient in possession of all personal possessions including polar care. All questions answered. Patient without concern.

## 2023-04-27 NOTE — Plan of Care (Signed)
  Problem: Coping: Goal: Ability to adjust to condition or change in health will improve Outcome: Progressing   Problem: Nutritional: Goal: Maintenance of adequate nutrition will improve Outcome: Progressing   Problem: Skin Integrity: Goal: Risk for impaired skin integrity will decrease Outcome: Progressing   

## 2023-04-27 NOTE — Evaluation (Signed)
Occupational Therapy Evaluation Patient Details Name: Brenda Hicks MRN: [id number] DOB: July 13, 1966 Today's Date: 04/27/2023   History of Present Illness 57 yo female admitted for L TKA.   Clinical Impression   Pt was seen for OT evaluation this date. Prior to hospital admission, pt was. Pt lives independently in an apartment with family. Pt demonstrated good knowledge of precautions, TED hose management, Polar care, and DME. Pt currently requires set-up assist - supervision for transfers. Anticipated MIN A for dressing and bathing, which pt's husband is able and willing to facilitate. Pt expressed no concerns at this time. OT will sign off.         Recommendations for follow up therapy are one component of a multi-disciplinary discharge planning process, led by the attending physician.  Recommendations may be updated based on patient status, additional functional criteria and insurance authorization.   Assistance Recommended at Discharge Set up Supervision/Assistance  Patient can return home with the following A little help with walking and/or transfers;A little help with bathing/dressing/bathroom;Assistance with cooking/housework;Assist for transportation;Help with stairs or ramp for entrance    Functional Status Assessment  Patient has had a recent decline in their functional status and demonstrates the ability to make significant improvements in function in a reasonable and predictable amount of time.  Equipment Recommendations  None recommended by OT    Recommendations for Other Services       Precautions / Restrictions Precautions Precautions: Fall Restrictions Weight Bearing Restrictions: Yes LLE Weight Bearing: Weight bearing as tolerated      Mobility Bed Mobility                    Transfers Overall transfer level: Needs assistance Equipment used: Rolling walker (2 wheels) Transfers: Sit to/from Stand Sit to Stand: Min guard           General  transfer comment: Able to sit from recliner and toilet with supervision and FW.      Balance Overall balance assessment: Modified Independent                                         ADL either performed or assessed with clinical judgement   ADL Overall ADL's : Needs assistance/impaired                         Toilet Transfer: Min guard;Ambulation;Rolling walker (2 wheels)   Toileting- Clothing Manipulation and Hygiene: Independent;Sit to/from stand       Functional mobility during ADLs: Supervision/safety;Rolling walker (2 wheels) General ADL Comments: Supervision and extra time needed to complete ADL tasks.     Vision         Perception     Praxis      Pertinent Vitals/Pain Pain Assessment Pain Assessment: 0-10 Pain Score: 6  Pain Location: L knee Pain Descriptors / Indicators: Discomfort, Numbness, Aching Pain Intervention(s): Premedicated before session, Patient requesting pain meds-RN notified, Relaxation, Ice applied     Hand Dominance     Extremity/Trunk Assessment Upper Extremity Assessment Upper Extremity Assessment: Overall WFL for tasks assessed   Lower Extremity Assessment Lower Extremity Assessment: Overall WFL for tasks assessed       Communication Communication Communication: No difficulties   Cognition Arousal/Alertness: Awake/alert Behavior During Therapy: WFL for tasks assessed/performed Overall Cognitive Status: Within Functional Limits for tasks assessed  General Comments       Exercises     Shoulder Instructions      Home Living Family/patient expects to be discharged to:: Private residence Living Arrangements: Spouse/significant other;Children Available Help at Discharge: Family;Available 24 hours/day Type of Home: Apartment Home Access: Level entry     Home Layout: Bed/bath upstairs;Two level Alternate Level Stairs-Number of Steps:  flight Alternate Level Stairs-Rails: Right Bathroom Shower/Tub: Producer, television/film/video: Standard Bathroom Accessibility: Yes How Accessible: Accessible via walker Home Equipment: Rolling Walker (2 wheels)          Prior Functioning/Environment Prior Level of Function : Independent/Modified Independent;Driving             Mobility Comments: reports some falls due to knee giving way. ADLs Comments: independent        OT Problem List: Decreased range of motion;Decreased activity tolerance      OT Treatment/Interventions:      OT Goals(Current goals can be found in the care plan section) Acute Rehab OT Goals Patient Stated Goal: To go home OT Goal Formulation: With patient Time For Goal Achievement: 05/11/23 Potential to Achieve Goals: Good  OT Frequency:      Co-evaluation              AM-PAC OT "6 Clicks" Daily Activity     Outcome Measure Help from another person eating meals?: None Help from another person taking care of personal grooming?: None Help from another person toileting, which includes using toliet, bedpan, or urinal?: None Help from another person bathing (including washing, rinsing, drying)?: A Little Help from another person to put on and taking off regular upper body clothing?: None Help from another person to put on and taking off regular lower body clothing?: A Little 6 Click Score: 22   End of Session Equipment Utilized During Treatment: Rolling walker (2 wheels)  Activity Tolerance: Patient tolerated treatment well;No increased pain Patient left: in chair;with call bell/phone within reach  OT Visit Diagnosis: Unsteadiness on feet (R26.81);Other abnormalities of gait and mobility (R26.89)                Time: 1610-9604 OT Time Calculation (min): 19 min Charges:  OT General Charges $OT Visit: 1 Visit OT Evaluation $OT Eval Low Complexity: 1 Low  [address] OTS

## 2023-04-27 NOTE — Discharge Summary (Signed)
Physician Discharge Summary  Patient ID: Brenda Hicks MRN: [id number] DOB/AGE: 06/11/1966 57 y.o.  Admit date: 04/26/2023 Discharge date: 04/27/2023  Admission Diagnoses:  Status post total knee replacement using cement, left [Z96.652] Degenerative joint disease of the left knee.  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/03/2021   Joint pain 11/03/2021   Osteoarthritis of left knee 10/08/2021   Pain due to onychomycosis of toenails of both feet 03/05/2021   Plantar fasciitis, bilateral 03/05/2021   Frequent bowel movements 04/25/2020   Abnormal Pap smear 03/31/2020   Arthritis 03/31/2020   Sprain of collateral ligament of right knee 03/10/2020   Leg swelling 02/08/2020   S/P cervical spinal fusion 09/24/2019   Hyperlipidemia associated with type 2 diabetes mellitus (HCC) 08/08/2019   Low back pain radiating to left lower extremity 08/07/2019   Nontraumatic incomplete tear of right rotator cuff 01/04/2019   Tendinitis of upper biceps tendon of right shoulder 01/04/2019   Rotator cuff tendinitis, right 12/01/2018   Cervical myelopathy (HCC) 08/09/2018   DJD (degenerative joint disease) of cervical spine 08/04/2018   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HCC) 08/03/2018   History of ischemic stroke 03/30/2018   Ischemic chest pain (HCC) 03/29/2018   Chest pain 03/29/2018   Sebaceous cyst 06/01/2016   Obstructive apnea 05/31/2016   Acid reflux 05/31/2016   Essential hypertension 05/31/2016   Abnormal Pap smear of cervix 05/31/2016   Arthritis of knee, degenerative 07/25/2015   History of artificial joint 07/25/2015   Gonalgia 02/17/2015   Type 2 diabetes mellitus (HCC) 10/23/2014   Mixed conductive and  sensorineural hearing loss, 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/2014   Diabetic polyneuropathy associated with type 2 diabetes mellitus (HCC) 05/16/2014   Endometrial polyp 11/01/2013   Fibroids, intramural 10/09/2013   Pain due to knee joint prosthesis (HCC) 10/05/2013   Body mass index (BMI) of 50-59.9 in adult (HCC) 10/03/2013   Abnormal uterine bleeding 10/03/2013   Morbid obesity (HCC) 10/01/2013   Excessive and frequent menstruation with irregular cycle 10/01/2013   Adaptive colitis 10/01/2013   History of migraine headaches 10/01/2013   H/O malignant neoplasm of skin 10/01/2013   Fatty liver disease, nonalcoholic 10/01/2013   Diverticulitis 10/01/2013   Former smoker 08/29/2013   H/O total knee replacement 08/29/2013   Insomnia 08/29/2013   Dysmenorrhea 08/29/2013   Airway hyperreactivity 08/29/2013   Absolute anemia 08/29/2013    Past Medical History:  Diagnosis Date   Anemia    vitamin d deficiency   Anxiety    Arthritis    Asthma    WELL CONTROLLED   Cancer of ear    skin cancer left ear   COPD (chronic obstructive pulmonary disease) (HCC)    Depression    Diabetes mellitus without complication (HCC)    Fatty liver    Hypertension    Kidney cysts    per patient, never had   Renal disorder    Sleep apnea    DOES NOT USE CPAP. waiting for new machine and a new sleep study   Stroke [facility]) May or June 2019   TIA. no residual symptoms     Transfusion: None.   Consultants (if any):   Discharged Condition: Improved  Hospital  Course: Brenda Hicks is an 57 y.o. female who was admitted 04/26/2023 with a diagnosis of degenerative joint disease of the left knee and went to the operating room on 04/26/2023 and underwent the above named procedures.    Surgeries: Procedure(s): TOTAL KNEE ARTHROPLASTY on 04/26/2023 Patient tolerated the surgery well. Taken to PACU where she was stabilized and then transferred to the post-op  area.  Started on Eliquis 2.5mg  every 12 hours. Foot pumps applied bilaterally at 80 mm. Heels elevated on bed with rolled towels. No evidence of DVT. Negative Homan. Physical therapy started on day #1 for gait training and transfer. OT started day #1 for ADL and assisted devices.  Patient's IV was removed on POD1.  Implants: Left TKA using all-cemented [employer] Vanguard system with a 65 mm PCR femur, a 71 mm tibial tray with a 10 mm anterior stabilized E-poly insert, and a 31 x 6.2 mm all-poly 3-pegged domed patella.  She was given perioperative antibiotics:  Anti-infectives (From admission, onward)    Start     Dose/Rate Route Frequency Ordered Stop   04/26/23 1400  ceFAZolin (ANCEF) IVPB 3g/100 mL premix        3 g 200 mL/hr over 30 Minutes Intravenous Every 6 hours 04/26/23 1050 04/27/23 0334   04/26/23 0600  ceFAZolin (ANCEF) IVPB 3g/100 mL premix        3 g 200 mL/hr over 30 Minutes Intravenous On call to O.R. 04/25/23 2201 04/26/23 0759     .  She was given sequential compression devices, early ambulation, and Eliquis for DVT prophylaxis.  She benefited maximally from the hospital stay and there were no complications.    Recent vital signs:  Vitals:   04/27/23 0600 04/27/23 0729  BP: 127/69 (!) 141/77  Pulse: 62 (!) 56  Resp: 16 17  Temp: 98 F (36.7 C) (!) 97.1 F (36.2 C)  SpO2: 96% 96%    Recent laboratory studies:  Lab Results  Component Value Date   HGB 13.5 04/21/2023   HGB 12.7 02/21/2023   HGB 13.7 12/15/2022   Lab Results  Component Value Date   WBC 5.9 04/21/2023   PLT 238 04/21/2023   Lab Results  Component Value Date   INR 0.93 08/02/2018   Lab Results  Component Value Date   NA 140 04/21/2023   K 3.8 04/21/2023   CL 103 04/21/2023   CO2 28 04/21/2023   BUN 14 04/21/2023   CREATININE 1.10 (H) 04/21/2023   GLUCOSE 124 (H) 04/21/2023    Discharge Medications:   Allergies as of 04/27/2023       Reactions   Dilaudid [hydromorphone Hcl]  Nausea And Vomiting   Cymbalta [duloxetine Hcl] Nausea And Vomiting   Morphine And Codeine Nausea And Vomiting   Tramadol Nausea Only   If she takes antinausea medicine with this medicine, then she can tolerate it.        Medication List     STOP taking these medications    ketorolac 10 MG tablet Commonly known as: TORADOL   lisinopril 40 MG tablet Commonly known as: ZESTRIL   naproxen sodium 220 MG tablet Commonly known as: ALEVE   omeprazole 40 MG capsule Commonly known as: PRILOSEC   sertraline 25 MG tablet Commonly known as: Zoloft       TAKE these medications    Accu-Chek Guide test strip Generic drug: glucose blood Test blood sugar tid, Dx E11.65   albuterol 108 (90 Base) MCG/ACT inhaler Commonly known as:  VENTOLIN HFA Inhale 2 puffs into the lungs every 6 (six) hours as needed for wheezing or shortness of breath.   apixaban 2.5 MG Tabs tablet Commonly known as: Eliquis Take 1 tablet (2.5 mg total) by mouth 2 (two) times daily.   atorvastatin 40 MG tablet Commonly known as: LIPITOR Take 1 tablet (40 mg total) by mouth daily.   docusate sodium 100 MG capsule Commonly known as: COLACE Take 1 capsule (100 mg total) by mouth 2 (two) times daily as needed.   Gvoke HypoPen 1-Pack 1 MG/0.2ML Soaj Generic drug: Glucagon Inject 1 mg into the skin as needed (hypogylcemia).   insulin lispro 100 UNIT/ML injection Commonly known as: HumaLOG Inject 0.05 mLs (5 Units total) into the skin 3 (three) times daily before meals. 5 units in am, and 10 unit with lunch and dinner   ondansetron 4 MG tablet Commonly known as: ZOFRAN Take 1 tablet (4 mg total) by mouth every 6 (six) hours as needed for nausea.   oxyCODONE 5 MG immediate release tablet Commonly known as: Roxicodone Take 1-2 tablets (5-10 mg total) by mouth every 4 (four) hours as needed for severe pain.   Ozempic (0.25 or 0.5 MG/DOSE) 2 MG/3ML Sopn Generic drug: Semaglutide(0.25 or 0.5MG /DOS) Inject  0.5 mg into the skin once a week.   tiZANidine 4 MG tablet Commonly known as: Zanaflex Take 1 tablet (4 mg total) by mouth every 6 (six) hours as needed for muscle spasms.        Diagnostic Studies: DG Knee Left Port  Result Date: 04/26/2023 CLINICAL DATA:  Status post total knee replacement, postop. EXAM: PORTABLE LEFT KNEE - 1-2 VIEW COMPARISON:  None Available. FINDINGS: Left knee arthroplasty in expected alignment. No periprosthetic lucency or fracture. There has been patellar resurfacing. Recent postsurgical change includes air and edema in the soft tissues and joint space. Anterior skin staples in place. IMPRESSION: Left knee arthroplasty without immediate postoperative complication. Electronically Signed   By: Narda Rutherford M.D.   On: 04/26/2023 10:45    Disposition: Plan for possible d/c home today pending progress with PT.   Follow-up Information     Anson [place], PA-C Follow up in 14 day(s).   Specialty: Physician Assistant Why: Mindi Slicker information: [address] [place] [address] [phone]                Signed: Meriel Pica PA-C 04/27/2023, 7:48 AM

## 2023-04-27 NOTE — Progress Notes (Signed)
Patient is not able to walk the distance required to go the bathroom, or he/she is unable to safely negotiate stairs required to access the bathroom.  A 3in1 BSC will alleviate this problem  

## 2023-04-27 NOTE — Progress Notes (Signed)
Physical Therapy Treatment Patient Details Name: Brenda Hicks MRN: [id number] DOB: July 27, 1966 Today's Date: 04/27/2023   History of Present Illness 57 yo female admitted for L TKA.    PT Comments    Patient received up in recliner, just finished with OT session. Patient is agreeable to PT, states "I want to go home.". Cues needed for hand placement with transfers, patient ambulated 150 feet with RW and supervision. Ambulated up/down 4 steps x 2 reps with supervision. Patient performed seated HEP and handout given. All questions answered and patient is ready for discharge, RN notified.      Recommendations for follow up therapy are one component of a multi-disciplinary discharge planning process, led by the attending physician.  Recommendations may be updated based on patient status, additional functional criteria and insurance authorization.  Follow Up Recommendations       Assistance Recommended at Discharge Intermittent Supervision/Assistance  Patient can return home with the following A little help with walking and/or transfers;A little help with bathing/dressing/bathroom;Assistance with cooking/housework;Assist for transportation   Equipment Recommendations  BSC/3in1    Recommendations for Other Services       Precautions / Restrictions Precautions Precautions: Fall Restrictions Weight Bearing Restrictions: Yes LLE Weight Bearing: Weight bearing as tolerated     Mobility  Bed Mobility               General bed mobility comments: NT patient in recliner on arrival    Transfers Overall transfer level: Modified independent Equipment used: Rolling walker (2 wheels) Transfers: Sit to/from Stand Sit to Stand: Modified independent (Device/Increase time)           General transfer comment: Cues for hand placement, safety    Ambulation/Gait Ambulation/Gait assistance: Supervision Gait Distance (Feet): 150 Feet Assistive device: Rolling walker (2  wheels) Gait Pattern/deviations: Step-to pattern, Decreased step length - right, Decreased step length - left, Decreased stride length Gait velocity: decr     General Gait Details: ambulates with RW, supervision. Fatigued   Stairs Stairs: Yes Stairs assistance: Supervision Stair Management: Two rails, One rail Right, Step to pattern, Sideways, Forwards Number of Stairs: 8 General stair comments: ambulated up/down 1 x with B rails, then again with single rail on right, supervision. Cues for sequencing.   Wheelchair Mobility    Modified Rankin (Stroke Patients Only)       Balance Overall balance assessment: Modified Independent                                          Cognition Arousal/Alertness: Awake/alert Behavior During Therapy: WFL for tasks assessed/performed Overall Cognitive Status: Within Functional Limits for tasks assessed                                          Exercises Total Joint Exercises Ankle Circles/Pumps: AROM, Both, 10 reps Quad Sets: AROM, Left, 5 reps Short Arc Quad: AROM, Left, 5 reps Heel Slides: AAROM, Left, 5 reps Hip ABduction/ADduction: AROM, Left, 5 reps Straight Leg Raises: AAROM, Left, 5 reps Long Arc Quad: AROM, Left, 5 reps Goniometric ROM: 0-88    General Comments        Pertinent Vitals/Pain Pain Assessment Pain Assessment: 0-10 Pain Score: 5  Pain Location: L knee Pain Descriptors / Indicators: Sore, Discomfort Pain Intervention(s): Monitored  during session, Repositioned, Premedicated before session, Ice applied    Home Living Family/patient expects to be discharged to:: Private residence Living Arrangements: Spouse/significant other;Children Available Help at Discharge: Family;Available 24 hours/day Type of Home: Apartment Home Access: Level entry     Alternate Level Stairs-Number of Steps: flight Home Layout: Bed/bath upstairs;Two level Home Equipment: Agricultural consultant (2  wheels)      Prior Function            PT Goals (current goals can now be found in the care plan section) Acute Rehab PT Goals Patient Stated Goal: to return home PT Goal Formulation: With patient Time For Goal Achievement: 04/30/23 Potential to Achieve Goals: Good Progress towards PT goals: Progressing toward goals    Frequency    BID      PT Plan Current plan remains appropriate    Co-evaluation              AM-PAC PT "6 Clicks" Mobility   Outcome Measure  Help needed turning from your back to your side while in a flat bed without using bedrails?: None Help needed moving from lying on your back to sitting on the side of a flat bed without using bedrails?: None Help needed moving to and from a bed to a chair (including a wheelchair)?: A Little Help needed standing up from a chair using your arms (e.g., wheelchair or bedside chair)?: A Little Help needed to walk in hospital room?: A Little Help needed climbing 3-5 steps with a railing? : A Little 6 Click Score: 20    End of Session   Activity Tolerance: Patient tolerated treatment well Patient left: in chair;with call bell/phone within reach Nurse Communication: Mobility status PT Visit Diagnosis: Difficulty in walking, not elsewhere classified (R26.2);Muscle weakness (generalized) (M62.81);Other abnormalities of gait and mobility (R26.89);Unsteadiness on feet (R26.81)     Time: 0937-1000 PT Time Calculation (min) (ACUTE ONLY): 23 min  Charges:  $Gait Training: 8-22 mins $Therapeutic Exercise: 8-22 mins                     Ghassan Coggeshall, PT, GCS 04/27/23,10:14 AM

## 2023-04-28 ENCOUNTER — Telehealth: Payer: Self-pay

## 2023-04-28 NOTE — Anesthesia Postprocedure Evaluation (Signed)
Anesthesia Post Note  Patient: Brenda Hicks  Procedure(s) Performed: TOTAL KNEE ARTHROPLASTY (Left: Knee)  Anesthesia Type: Spinal Level of consciousness: oriented and awake and alert Pain management: pain level controlled Vital Signs Assessment: post-procedure vital signs reviewed and stable Respiratory status: spontaneous breathing and respiratory function stable Cardiovascular status: blood pressure returned to baseline and stable Postop Assessment: no headache, no backache, no apparent nausea or vomiting and patient able to bend at knees Anesthetic complications: no Comments: Patient discharged prior to post-op evaluation. Per PT/OT notes patient able to ambulate with assistance.    No notable events documented.   Last Vitals:  Vitals:   04/27/23 0600 04/27/23 0729  BP: 127/69 (!) 141/77  Pulse: 62 (!) 56  Resp: 16 17  Temp: 36.7 C (!) 36.2 C  SpO2: 96% 96%    Last Pain:  Vitals:   04/27/23 0953  TempSrc:   PainSc: 5                  Louie Boston

## 2023-04-28 NOTE — Transitions of Care (Post Inpatient/ED Visit) (Signed)
04/28/2023  Name: Brenda Hicks MRN: [id number] DOB: September 29, 1966  Today's TOC FU Call Status: Today's TOC FU Call Status:: Successful TOC FU Call Competed TOC FU Call Complete Date: 04/28/23  Transition Care Management Follow-up Telephone Call Date of Discharge: 04/27/23 Discharge Facility: [facility]) Type of Discharge: Inpatient Admission Primary Inpatient Discharge Diagnosis:: total left knee replacement How have you been since you were released from the hospital?: Better Any questions or concerns?: No  Items Reviewed: Did you receive and understand the discharge instructions provided?: Yes Medications obtained,verified, and reconciled?: Yes (Medications Reviewed) Any new allergies since your discharge?: No Dietary orders reviewed?: Yes Do you have support at home?: Yes People in Home: spouse  Medications Reviewed Today: Medications Reviewed Today     Reviewed by Karena Addison, LPN (Licensed Practical Nurse) on 04/28/23 at [phone]  Med List Status: <None>   Medication Order Taking? Sig Documenting Provider Last Dose Status Informant  albuterol (VENTOLIN HFA) 108 (90 Base) MCG/ACT inhaler [id number] Yes Inhale 2 puffs into the lungs every 6 (six) hours as needed for wheezing or shortness of breath. Margarita Mail, DO Taking Active   apixaban (ELIQUIS) 2.5 MG TABS tablet [id number] Yes Take 1 tablet (2.5 mg total) by mouth 2 (two) times daily. Anson Oregon, PA-C Taking Active   atorvastatin (LIPITOR) 40 MG tablet [id number] Yes Take 1 tablet (40 mg total) by mouth daily. Margarita Mail, DO Taking Active Self  docusate sodium (COLACE) 100 MG capsule [id number] Yes Take 1 capsule (100 mg total) by mouth 2 (two) times daily as needed. Hildred Laser, MD Taking Active Self  Glucagon (GVOKE HYPOPEN 1-PACK) 1 MG/0.2ML Ivory Broad [id number] Yes Inject 1 mg into the skin as needed (hypogylcemia). Margarita Mail, DO Taking Active   glucose blood  (ACCU-CHEK GUIDE) test strip [id number] Yes Test blood sugar tid, Dx E11.65 Margarita Mail, DO Taking Active   insulin lispro (HUMALOG) 100 UNIT/ML injection [id number] Yes Inject 0.05 mLs (5 Units total) into the skin 3 (three) times daily before meals. 5 units in am, and 10 unit with lunch and dinner Poggi, Excell Seltzer, MD Taking Active   ondansetron (ZOFRAN) 4 MG tablet [id number] Yes Take 1 tablet (4 mg total) by mouth every 6 (six) hours as needed for nausea. Anson Oregon, PA-C Taking Active   oxyCODONE (ROXICODONE) 5 MG immediate release tablet [id number] Yes Take 1-2 tablets (5-10 mg total) by mouth every 4 (four) hours as needed for severe pain. Anson Oregon, PA-C Taking Active   Semaglutide,0.25 or 0.5MG /DOS, (OZEMPIC, 0.25 OR 0.5 MG/DOSE,) 2 MG/3ML SOPN [id number] Yes Inject 0.5 mg into the skin once a week. Margarita Mail, DO Taking Active   tiZANidine (ZANAFLEX) 4 MG tablet [id number] Yes Take 1 tablet (4 mg total) by mouth every 6 (six) hours as needed for muscle spasms. Margarita Mail, DO Taking Active Self            Home Care and Equipment/Supplies: Were Home Health Services Ordered?: Yes Name of Home Health Agency:: unknown Has Agency set up a time to come to your home?: No Any new equipment or medical supplies ordered?: Yes Were you able to get the equipment/medical supplies?: Yes Do you have any questions related to the use of the equipment/supplies?: No  Functional Questionnaire: Do you need assistance with bathing/showering or dressing?: Yes Do you need assistance with meal preparation?: Yes Do you need assistance with eating?: No Do you have difficulty maintaining continence: No Do you need assistance with  getting out of bed/getting out of a chair/moving?: Yes Do you have difficulty managing or taking your medications?: No  Follow up appointments reviewed: PCP Follow-up appointment confirmed?: NA Specialist Hospital Follow-up appointment confirmed?:  Yes Date of Specialist follow-up appointment?: 05/09/23 Follow-Up Specialty Provider:: ortho Marney Doctor Do you need transportation to your follow-up appointment?: No Do you understand care options if your condition(s) worsen?: Yes-patient verbalized understanding    SIGNATURE Karena Addison, LPN [facility] Nurse Health Advisor Direct Dial [phone]

## 2023-04-29 ENCOUNTER — Other Ambulatory Visit: Payer: Self-pay | Admitting: Surgery

## 2023-04-29 ENCOUNTER — Ambulatory Visit
Admission: RE | Admit: 2023-04-29 | Discharge: 2023-04-29 | Disposition: A | Discharge status: Home / Self Care | Payer: Medicaid Other | Source: Ambulatory Visit | Attending: Surgery | Admitting: Surgery

## 2023-04-29 DIAGNOSIS — Z96652 Presence of left artificial knee joint: Secondary | ICD-10-CM | POA: Diagnosis present

## 2023-05-08 NOTE — Progress Notes (Deleted)
Established Patient Office Visit  Subjective:  Patient ID: Brenda Hicks, female    DOB: 1966/05/18  Age: 57 y.o. MRN: [id number]  CC:  No chief complaint on file.   HPI Brenda Hicks presents for follow up on chronic medical conditions.  Leg Swelling: -Was seen by neurosurgery yesterday and was having increased edema in her left leg with pain -Vas [place] negative for acute DVT 02/16/23  Hypertension, OSA: -Medications: Currently on Lisinopril 40 mg  -Patient is compliant with medications and reports no side effects.  -Checking BP at home: no -Denies any SOB, CP, LE edema, medication SEs, or symptoms of hypotension   Diabetes, Type 2 -Last A1c 7.7% 5/24 -Medications: Lantus 30 units in the am, 20 units in the pm, Humalog 10 units with breakfast and lunch, 20 units at dinner, Ozempic 0.5 mg weekly -Failed Meds: Could not get Trulicity covered -Diet: trying to eat more healthy - mainly eats breakfast like toast and fruit and then a larger meal at dinner  -Exercise: None right now due to her back pain and knee pain. Has knee surgery scheduled -Blood sugars: going low, as low as 52 in the middle of the night. Was having some lows in the 70-80 range in the middle of the day -Eye exam: UTD 1/24 -Foot exam: UTD 9/23 -Microalbumin: UTD 12/23 on ACEI -Statin: yes -PNA vaccine: Prevnar 23 in 2014  Health Maintenance: -Blood work UTD -Mammogram 1/24 Birads-1  Past Medical History:  Diagnosis Date   Anemia    vitamin d deficiency   Anxiety    Arthritis    Asthma    WELL CONTROLLED   Cancer of ear    skin cancer left ear   COPD (chronic obstructive pulmonary disease) (HCC)    Depression    Diabetes mellitus without complication (HCC)    Fatty liver    Hypertension    Kidney cysts    per patient, never had   Renal disorder    Sleep apnea    DOES NOT USE CPAP. waiting for new machine and a new sleep study   [facility]) May or June 2019   TIA. no residual symptoms     Past Surgical History:  Procedure Laterality Date   ABDOMINAL HYSTERECTOMY     ANTERIOR CERVICAL DECOMP/DISCECTOMY FUSION N/A 08/09/2018   Procedure: ANTERIOR CERVICAL DECOMPRESSION/DISCECTOMY FUSION 1 LEVEL- C4-5;  Surgeon: Venetia Night, MD;  Location: [facility] ORS;  Service: Neurosurgery;  Laterality: N/A;   BACK SURGERY     NECK   COLONOSCOPY WITH PROPOFOL N/A 02/26/2021   Procedure: COLONOSCOPY WITH PROPOFOL;  Surgeon: Wyline Mood, MD;  Location: [facility] ENDOSCOPY;  Service: Endoscopy;  Laterality: N/A;   DILATATION & CURETTAGE/HYSTEROSCOPY WITH MYOSURE N/A 01/19/2021   Procedure: DILATATION & CURETTAGE/HYSTEROSCOPY;  Surgeon: Hildred Laser, MD;  Location: [facility] ORS;  Service: Gynecology;  Laterality: N/A;   DILATION AND CURETTAGE OF UTERUS     ENDOMETRIAL ABLATION N/A 01/19/2021   Procedure: ENDOMETRIAL ABLATION, MINERVA;  Surgeon: Hildred Laser, MD;  Location: [facility] ORS;  Service: Gynecology;  Laterality: N/A;   ENDOMETRIAL BIOPSY     benign   ESOPHAGOGASTRODUODENOSCOPY (EGD) WITH PROPOFOL N/A 08/24/2022   Procedure: ESOPHAGOGASTRODUODENOSCOPY (EGD) WITH PROPOFOL;  Surgeon: Toney Reil, MD;  Location: [facility] ENDOSCOPY;  Service: Gastroenterology;  Laterality: N/A;   EXPLORATORY LAPAROTOMY  1992   REMOVAL OF RUPTURED ECTOPIC   HAND SURGERY Right 1998   cyst removed   HERNIA REPAIR  1975   UMBILICAL  JOINT REPLACEMENT Right 2014   TKR   KNEE ARTHROSCOPY Right 2012   KNEE SURGERY Right 2014   total knee replacement   ROBOTIC ASSISTED LAPAROSCOPIC HYSTERECTOMY AND SALPINGECTOMY Bilateral 03/15/2022   Procedure: XI ROBOTIC ASSISTED LAPAROSCOPIC HYSTERECTOMY;  Surgeon: Hildred Laser, MD;  Location: [facility] ORS;  Service: Gynecology;  Laterality: Bilateral;   SHOULDER ARTHROSCOPY WITH BICEPSTENOTOMY Left 12/14/2016   Procedure: SHOULDER ARTHROSCOPY WITH BICEPSTENOTOMY;  Surgeon: Christena Flake, MD;  Location: [facility] ORS;  Service: Orthopedics;  Laterality: Left;   SHOULDER  ARTHROSCOPY WITH OPEN ROTATOR CUFF REPAIR Left 12/14/2016   Procedure: SHOULDER ARTHROSCOPY WITH OPEN ROTATOR CUFF REPAIR AND ARTHROSCOPIC ROTATOR CUFF REPAIR;  Surgeon: Christena Flake, MD;  Location: [facility] ORS;  Service: Orthopedics;  Laterality: Left;   SHOULDER ARTHROSCOPY WITH ROTATOR CUFF REPAIR Right 01/04/2019   Procedure: SHOULDER ARTHROSCOPY WITH ROTATOR CUFF REPAIR;  Surgeon: Christena Flake, MD;  Location: [facility] ORS;  Service: Orthopedics;  Laterality: Right;   SHOULDER ARTHROSCOPY WITH SUBACROMIAL DECOMPRESSION Left 12/14/2016   Procedure: SHOULDER ARTHROSCOPY WITH SUBACROMIAL DECOMPRESSION;  Surgeon: Christena Flake, MD;  Location: [facility] ORS;  Service: Orthopedics;  Laterality: Left;   SHOULDER ARTHROSCOPY WITH SUBACROMIAL DECOMPRESSION AND BICEP TENDON REPAIR Right 01/04/2019   Procedure: SHOULDER ARTHROSCOPY WITH DEBRIDEMENT AND SUBACROMIAL DECOMPRESSION-RIGHT;  Surgeon: Christena Flake, MD;  Location: [facility] ORS;  Service: Orthopedics;  Laterality: Right;   TOTAL KNEE ARTHROPLASTY Left 04/26/2023   Procedure: TOTAL KNEE ARTHROPLASTY;  Surgeon: Christena Flake, MD;  Location: [facility] ORS;  Service: Orthopedics;  Laterality: Left;   TUBAL LIGATION      Family History  Problem Relation Age of Onset   Hypertension Mother    Thyroid disease Mother    Lupus Mother    Congestive Heart Failure Mother    Hypertension Brother    CAD Maternal Grandmother    Breast cancer Neg Hx    Ovarian cancer Neg Hx    Colon cancer Neg Hx     Social History   Socioeconomic History   Marital status: Married    Spouse name: brian   Number of children: 7   Years of education: Not on file   Highest education level: 11th grade  Occupational History   Occupation: disabled    Comment: disabled  Tobacco Use   Smoking status: Former    Packs/day: 2.00    Years: 25.00    Additional pack years: 0.00    Total pack years: 50.00    Types: Cigarettes    Quit date: 06/09/2017    Years since quitting: 5.9   Smokeless  tobacco: Never  Vaping Use   Vaping Use: Never used  Substance and Sexual Activity   Alcohol use: No   Drug use: Yes    Frequency: 7.0 times per week    Types: Marijuana    Comment: pt states as needed when she is hurting real bad   Sexual activity: Yes    Partners: Male    Birth control/protection: None  Other Topics Concern   Not on file  Social History Narrative   Have 6 biological childrean and custody of niece since 25 months.   Social Determinants of Health   Financial Resource Strain: Low Risk  (02/16/2023)   Overall Financial Resource Strain (CARDIA)    Difficulty of Paying Living Expenses: Not very hard  Food Insecurity: Food Insecurity Present (04/26/2023)   Hunger Vital Sign    Worried About Running Out of Food in the Last Year: Never  true    Ran Out of Food in the Last Year: Sometimes true  Transportation Needs: No Transportation Needs (04/26/2023)   PRAPARE - Administrator, Civil Service (Medical): No    Lack of Transportation (Non-Medical): No  Physical Activity: Unknown (02/16/2023)   Exercise Vital Sign    Days of Exercise per Week: Patient declined    Minutes of Exercise per Session: Not on file  Stress: No Stress Concern Present (02/16/2023)   [employer] of Occupational Health - Occupational Stress Questionnaire    Feeling of Stress : Only a little  Social Connections: Moderately Isolated (02/16/2023)   Social Connection and Isolation Panel [NHANES]    Frequency of Communication with Friends and Family: Once a week    Frequency of Social Gatherings with Friends and Family: Never    Attends Religious Services: More than 4 times per year    Active Member of [employer] or Organizations: No    Attends Engineer, structural: Not on file    Marital Status: Married  Catering manager Violence: Not At Risk (04/26/2023)   Humiliation, Afraid, Rape, and Kick questionnaire    Fear of Current or Ex-Partner: No    Emotionally Abused: No    Physically  Abused: No    Sexually Abused: No    Outpatient Medications Prior to Visit  Medication Sig Dispense Refill   albuterol (VENTOLIN HFA) 108 (90 Base) MCG/ACT inhaler Inhale 2 puffs into the lungs every 6 (six) hours as needed for wheezing or shortness of breath. 18 g 3   apixaban (ELIQUIS) 2.5 MG TABS tablet Take 1 tablet (2.5 mg total) by mouth 2 (two) times daily. 30 tablet 0   atorvastatin (LIPITOR) 40 MG tablet Take 1 tablet (40 mg total) by mouth daily. 90 tablet 3   docusate sodium (COLACE) 100 MG capsule Take 1 capsule (100 mg total) by mouth 2 (two) times daily as needed. 30 capsule 2   Glucagon (GVOKE HYPOPEN 1-PACK) 1 MG/0.2ML SOAJ Inject 1 mg into the skin as needed (hypogylcemia). 0.2 mL 2   glucose blood (ACCU-CHEK GUIDE) test strip Test blood sugar tid, Dx E11.65 100 each 12   insulin lispro (HUMALOG) 100 UNIT/ML injection Inject 0.05 mLs (5 Units total) into the skin 3 (three) times daily before meals. 5 units in am, and 10 unit with lunch and dinner     ondansetron (ZOFRAN) 4 MG tablet Take 1 tablet (4 mg total) by mouth every 6 (six) hours as needed for nausea. 30 tablet 0   oxyCODONE (ROXICODONE) 5 MG immediate release tablet Take 1-2 tablets (5-10 mg total) by mouth every 4 (four) hours as needed for severe pain. 40 tablet 0   Semaglutide,0.25 or 0.5MG /DOS, (OZEMPIC, 0.25 OR 0.5 MG/DOSE,) 2 MG/3ML SOPN Inject 0.5 mg into the skin once a week. 2 mL 3   tiZANidine (ZANAFLEX) 4 MG tablet Take 1 tablet (4 mg total) by mouth every 6 (six) hours as needed for muscle spasms. 30 tablet 0   No facility-administered medications prior to visit.    Allergies  Allergen Reactions   Dilaudid [Hydromorphone Hcl] Nausea And Vomiting   Cymbalta [Duloxetine Hcl] Nausea And Vomiting   Morphine And Codeine Nausea And Vomiting   Tramadol Nausea Only    If she takes antinausea medicine with this medicine, then she can tolerate it.    ROS Review of Systems  All other systems reviewed and are  negative.     Objective:    Physical  Exam Constitutional:      Appearance: Normal appearance. She is obese.  HENT:     Head: Normocephalic and atraumatic.  Eyes:     Conjunctiva/sclera: Conjunctivae normal.  Cardiovascular:     Rate and Rhythm: Normal rate and regular rhythm.  Pulmonary:     Effort: Pulmonary effort is normal.     Breath sounds: Normal breath sounds.  Musculoskeletal:     Right lower leg: Edema present.     Left lower leg: Edema present.     Comments: 1+ BLE pitting edema, slightly worse on the left  Skin:    General: Skin is warm and dry.  Neurological:     General: No focal deficit present.     Mental Status: She is alert. Mental status is at baseline.  Psychiatric:        Mood and Affect: Mood normal.        Behavior: Behavior normal.     LMP 02/22/2022 (Approximate)  Wt Readings from Last 3 Encounters:  04/26/23 (!) 317 lb (143.8 kg)  04/21/23 (!) 316 lb 9.3 oz (143.6 kg)  03/16/23 (!) 321 lb 1.6 oz (145.7 kg)     Health Maintenance Due  Topic Date Due   COVID-19 Vaccine (6 - 2023-24 season) 07/23/2022   Lung Cancer Screening  01/29/2023   OPHTHALMOLOGY EXAM  03/05/2023    There are no preventive care reminders to display for this patient.  Lab Results  Component Value Date   TSH 1.614 03/15/2022   Lab Results  Component Value Date   WBC 5.9 04/21/2023   HGB 13.5 04/21/2023   HCT 42.3 04/21/2023   MCV 93.2 04/21/2023   PLT 238 04/21/2023   Lab Results  Component Value Date   NA 140 04/21/2023   K 3.8 04/21/2023   CO2 28 04/21/2023   GLUCOSE 124 (H) 04/21/2023   BUN 14 04/21/2023   CREATININE 1.10 (H) 04/21/2023   BILITOT 0.3 04/21/2023   ALKPHOS 74 04/21/2023   AST 19 04/21/2023   ALT 18 04/21/2023   PROT 7.6 04/21/2023   ALBUMIN 3.8 04/21/2023   CALCIUM 8.8 (L) 04/21/2023   ANIONGAP 9 04/21/2023   EGFR 79 01/18/2022   Lab Results  Component Value Date   CHOL 185 12/14/2021   Lab Results  Component Value Date    HDL 41 (L) 12/14/2021   Lab Results  Component Value Date   LDLCALC 122 (H) 12/14/2021   Lab Results  Component Value Date   TRIG 109 12/14/2021   Lab Results  Component Value Date   CHOLHDL 4.5 12/14/2021   Lab Results  Component Value Date   HGBA1C 10.0 (A) 02/07/2023      Assessment & Plan:   1. Type 2 diabetes mellitus with hyperglycemia, with long-term current use of insulin (HCC): A1c uncontrolled however recently having lower blood sugars throughout the day, this must be due to change in diet. Decrease Lantus to 20 units at night but continue 30 units in the morning. Since her morning meal is smaller, decrease Humalog to 10 units and decrease to 20 units with dinner. Will try low dose Ozempic as well and follow up in 1 month. Refill test strips.   - Semaglutide,0.25 or 0.5MG /DOS, (OZEMPIC, 0.25 OR 0.5 MG/DOSE,) 2 MG/3ML SOPN; Inject 0.5 mg into the skin once a week.  Dispense: 2 mL; Refill: 3 - glucose blood (ACCU-CHEK GUIDE) test strip; Use as instructed  Dispense: 100 each; Refill: 12 - Glucagon (GVOKE HYPOPEN 1-PACK)  1 MG/0.2ML SOAJ; Inject 1 mg into the skin as needed (hypogylcemia).  Dispense: 0.2 mL; Refill: 2  2. Multiple episodes of hypoglycemia: Discussed eating strategies to try to avoid hypoglycemia, will try to increase protein to help keep blood sugar levels stable. Will prescribe glucagon pen to use in emergencies.   - Glucagon (GVOKE HYPOPEN 1-PACK) 1 MG/0.2ML SOAJ; Inject 1 mg into the skin as needed (hypogylcemia).  Dispense: 0.2 mL; Refill: 2  3. Lower extremity edema: [place] negative for DVT, 1+ pitting edema on exam. Keep legs elevated and recommend she go to [employer] for customized compression stockings.    Follow-up: No follow-ups on file.    Margarita Mail, DO

## 2023-05-09 ENCOUNTER — Ambulatory Visit
Admission: RE | Admit: 2023-05-09 | Discharge: 2023-05-09 | Disposition: A | Discharge status: Home / Self Care | Payer: Medicaid Other | Source: Ambulatory Visit | Attending: Student | Admitting: Student

## 2023-05-09 ENCOUNTER — Other Ambulatory Visit: Payer: Self-pay | Admitting: Student

## 2023-05-09 DIAGNOSIS — M7989 Other specified soft tissue disorders: Secondary | ICD-10-CM

## 2023-05-09 DIAGNOSIS — Z96652 Presence of left artificial knee joint: Secondary | ICD-10-CM

## 2023-05-10 ENCOUNTER — Ambulatory Visit: Payer: Medicaid Other | Admitting: Internal Medicine

## 2023-05-16 ENCOUNTER — Encounter: Payer: Self-pay | Admitting: Surgery

## 2023-06-07 ENCOUNTER — Ambulatory Visit: Payer: MEDICAID | Admitting: Obstetrics and Gynecology

## 2023-06-23 NOTE — Progress Notes (Signed)
ANNUAL PREVENTATIVE CARE GYNECOLOGY  ENCOUNTER NOTE  Subjective:       Brenda Hicks is a 57 y.o. [phone] female here for a routine annual gynecologic exam. The patient is sexually active. The patient is not taking hormone replacement therapy. Patient denies post-menopausal vaginal bleeding. The patient wears seatbelts: yes. The patient participates in regular exercise: yes. Has the patient ever been transfused or tattooed?: yes. The patient reports that there is not domestic violence in her life.  Current complaints: 1.  Notes left recent knee replacement 7 weeks ago. Is experiencing significant tenderness in her lower leg. Hurts to even touch or put socks on.  States that this did not happen when she had surgery on her other knee several years ago. She has mentioned this to her orthopedic surgeon.  Is currently on gabapentin but does not feel that this is helpful.  2. Is excited about her weight loss. Notes being started on Ozempic for her diabetes.  Is attempting to exercise several days per week.    Gynecologic History Patient's last menstrual period was 02/22/2022 (approximate). Contraception: status post hysterectomy Last Pap: 0309/2020. Results were: normal Last mammogram: 12/03/2022. Results were: normal Last Colonoscopy: 02/26/2021: 10 years Last Dexa Scan: Never done   Obstetric History OB History  Gravida Para Term Preterm AB Living  7 6 5 1 1 6   SAB IAB Ectopic Multiple Live Births      1   6    # Outcome Date GA Lbr Len/2nd Weight Sex Type Anes PTL Lv  7 Ectopic 1991          6 Term 11/11/89   8 lb 4 oz (3.742 kg) M Vag-Spont  N LIV  5 Term 03/10/88   7 lb 2 oz (3.232 kg) M Vag-Spont  N LIV  4 Term 02/15/87   6 lb (2.722 kg) M Vag-Spont  N LIV  3 Term 01/02/86   5 lb 10 oz (2.551 kg)  Vag-Spont  N LIV     Complications: Dysfunctional Labor  2 Preterm 12/31/84   4 lb 11 oz (2.126 kg) M Vag-Spont  Y LIV  1 Term 03/05/84   6 lb 7 oz (2.92 kg) M Vag-Spont  N LIV     Past Medical History:  Diagnosis Date   Anemia    vitamin d deficiency   Anxiety    Arthritis    Asthma    WELL CONTROLLED   Cancer of ear    skin cancer left ear   COPD (chronic obstructive pulmonary disease) (HCC)    Depression    Diabetes mellitus without complication (HCC)    Fatty liver    Hypertension    Kidney cysts    per patient, never had   Renal disorder    Sleep apnea    DOES NOT USE CPAP. waiting for new machine and a new sleep study   Stroke [facility]) May or June 2019   TIA. no residual symptoms    Family History  Problem Relation Age of Onset   Hypertension Mother    Thyroid disease Mother    Lupus Mother    Congestive Heart Failure Mother    Hypertension Brother    CAD Maternal Grandmother    Breast cancer Neg Hx    Ovarian cancer Neg Hx    Colon cancer Neg Hx     Past Surgical History:  Procedure Laterality Date   ABDOMINAL HYSTERECTOMY     ANTERIOR CERVICAL DECOMP/DISCECTOMY FUSION N/A  08/09/2018   Procedure: ANTERIOR CERVICAL DECOMPRESSION/DISCECTOMY FUSION 1 LEVEL- C4-5;  Surgeon: Venetia Night, MD;  Location: [facility] ORS;  Service: Neurosurgery;  Laterality: N/A;   BACK SURGERY     NECK   COLONOSCOPY WITH PROPOFOL N/A 02/26/2021   Procedure: COLONOSCOPY WITH PROPOFOL;  Surgeon: Wyline Mood, MD;  Location: [facility] ENDOSCOPY;  Service: Endoscopy;  Laterality: N/A;   DILATATION & CURETTAGE/HYSTEROSCOPY WITH MYOSURE N/A 01/19/2021   Procedure: DILATATION & CURETTAGE/HYSTEROSCOPY;  Surgeon: Hildred Laser, MD;  Location: [facility] ORS;  Service: Gynecology;  Laterality: N/A;   DILATION AND CURETTAGE OF UTERUS     ENDOMETRIAL ABLATION N/A 01/19/2021   Procedure: ENDOMETRIAL ABLATION, MINERVA;  Surgeon: Hildred Laser, MD;  Location: [facility] ORS;  Service: Gynecology;  Laterality: N/A;   ENDOMETRIAL BIOPSY     benign   ESOPHAGOGASTRODUODENOSCOPY (EGD) WITH PROPOFOL N/A 08/24/2022   Procedure: ESOPHAGOGASTRODUODENOSCOPY (EGD) WITH PROPOFOL;  Surgeon: Toney Reil, MD;  Location: [facility] ENDOSCOPY;  Service: Gastroenterology;  Laterality: N/A;   EXPLORATORY LAPAROTOMY  1992   REMOVAL OF RUPTURED ECTOPIC   HAND SURGERY Right 1998   cyst removed   HERNIA REPAIR  1975   UMBILICAL   JOINT REPLACEMENT Right 2014   TKR   KNEE ARTHROSCOPY Right 2012   KNEE SURGERY Right 2014   total knee replacement   ROBOTIC ASSISTED LAPAROSCOPIC HYSTERECTOMY AND SALPINGECTOMY Bilateral 03/15/2022   Procedure: XI ROBOTIC ASSISTED LAPAROSCOPIC HYSTERECTOMY;  Surgeon: Hildred Laser, MD;  Location: [facility] ORS;  Service: Gynecology;  Laterality: Bilateral;   SHOULDER ARTHROSCOPY WITH BICEPSTENOTOMY Left 12/14/2016   Procedure: SHOULDER ARTHROSCOPY WITH BICEPSTENOTOMY;  Surgeon: Christena Flake, MD;  Location: [facility] ORS;  Service: Orthopedics;  Laterality: Left;   SHOULDER ARTHROSCOPY WITH OPEN ROTATOR CUFF REPAIR Left 12/14/2016   Procedure: SHOULDER ARTHROSCOPY WITH OPEN ROTATOR CUFF REPAIR AND ARTHROSCOPIC ROTATOR CUFF REPAIR;  Surgeon: Christena Flake, MD;  Location: [facility] ORS;  Service: Orthopedics;  Laterality: Left;   SHOULDER ARTHROSCOPY WITH ROTATOR CUFF REPAIR Right 01/04/2019   Procedure: SHOULDER ARTHROSCOPY WITH ROTATOR CUFF REPAIR;  Surgeon: Christena Flake, MD;  Location: [facility] ORS;  Service: Orthopedics;  Laterality: Right;   SHOULDER ARTHROSCOPY WITH SUBACROMIAL DECOMPRESSION Left 12/14/2016   Procedure: SHOULDER ARTHROSCOPY WITH SUBACROMIAL DECOMPRESSION;  Surgeon: Christena Flake, MD;  Location: [facility] ORS;  Service: Orthopedics;  Laterality: Left;   SHOULDER ARTHROSCOPY WITH SUBACROMIAL DECOMPRESSION AND BICEP TENDON REPAIR Right 01/04/2019   Procedure: SHOULDER ARTHROSCOPY WITH DEBRIDEMENT AND SUBACROMIAL DECOMPRESSION-RIGHT;  Surgeon: Christena Flake, MD;  Location: [facility] ORS;  Service: Orthopedics;  Laterality: Right;   TOTAL KNEE ARTHROPLASTY Left 04/26/2023   Procedure: TOTAL KNEE ARTHROPLASTY;  Surgeon: Christena Flake, MD;  Location: [facility] ORS;  Service: Orthopedics;   Laterality: Left;   TUBAL LIGATION      Social History   Socioeconomic History   Marital status: Married    Spouse name: brian   Number of children: 7   Years of education: Not on file   Highest education level: 11th grade  Occupational History   Occupation: disabled    Comment: disabled  Tobacco Use   Smoking status: Former    Current packs/day: 0.00    Average packs/day: 2.0 packs/day for 25.0 years (50.0 ttl pk-yrs)    Types: Cigarettes    Start date: 06/09/1992    Quit date: 06/09/2017    Years since quitting: 6.0   Smokeless tobacco: Never  Vaping Use   Vaping status: Never Used  Substance and Sexual Activity  Alcohol use: No   Drug use: Yes    Frequency: 7.0 times per week    Types: Marijuana    Comment: pt states as needed when she is hurting real bad   Sexual activity: Yes    Partners: Male    Birth control/protection: None  Other Topics Concern   Not on file  Social History Narrative   Have 6 biological childrean and custody of niece since 59 months.   Social Determinants of Health   Financial Resource Strain: Low Risk  (02/16/2023)   Overall Financial Resource Strain (CARDIA)    Difficulty of Paying Living Expenses: Not very hard  Food Insecurity: Food Insecurity Present (04/26/2023)   Hunger Vital Sign    Worried About Running Out of Food in the Last Year: Never true    Ran Out of Food in the Last Year: Sometimes true  Transportation Needs: No Transportation Needs (04/26/2023)   PRAPARE - Administrator, Civil Service (Medical): No    Lack of Transportation (Non-Medical): No  Physical Activity: Unknown (02/16/2023)   Exercise Vital Sign    Days of Exercise per Week: Patient declined    Minutes of Exercise per Session: Not on file  Stress: No Stress Concern Present (02/16/2023)   [employer] of Occupational Health - Occupational Stress Questionnaire    Feeling of Stress : Only a little  Social Connections: Moderately Isolated (02/16/2023)    Social Connection and Isolation Panel [NHANES]    Frequency of Communication with Friends and Family: Once a week    Frequency of Social Gatherings with Friends and Family: Never    Attends Religious Services: More than 4 times per year    Active Member of [employer] or Organizations: No    Attends Engineer, structural: Not on file    Marital Status: Married  Catering manager Violence: Not At Risk (04/26/2023)   Humiliation, Afraid, Rape, and Kick questionnaire    Fear of Current or Ex-Partner: No    Emotionally Abused: No    Physically Abused: No    Sexually Abused: No    Current Outpatient Medications on File Prior to Visit  Medication Sig Dispense Refill   albuterol (VENTOLIN HFA) 108 (90 Base) MCG/ACT inhaler Inhale 2 puffs into the lungs every 6 (six) hours as needed for wheezing or shortness of breath. 18 g 3   apixaban (ELIQUIS) 2.5 MG TABS tablet Take 1 tablet (2.5 mg total) by mouth 2 (two) times daily. 30 tablet 0   atorvastatin (LIPITOR) 40 MG tablet Take 1 tablet (40 mg total) by mouth daily. 90 tablet 3   docusate sodium (COLACE) 100 MG capsule Take 1 capsule (100 mg total) by mouth 2 (two) times daily as needed. 30 capsule 2   Glucagon (GVOKE HYPOPEN 1-PACK) 1 MG/0.2ML SOAJ Inject 1 mg into the skin as needed (hypogylcemia). 0.2 mL 2   glucose blood (ACCU-CHEK GUIDE) test strip Test blood sugar tid, Dx E11.65 100 each 12   insulin lispro (HUMALOG) 100 UNIT/ML injection Inject 0.05 mLs (5 Units total) into the skin 3 (three) times daily before meals. 5 units in am, and 10 unit with lunch and dinner     ondansetron (ZOFRAN) 4 MG tablet Take 1 tablet (4 mg total) by mouth every 6 (six) hours as needed for nausea. 30 tablet 0   oxyCODONE (ROXICODONE) 5 MG immediate release tablet Take 1-2 tablets (5-10 mg total) by mouth every 4 (four) hours as needed for severe pain. 40  tablet 0   Semaglutide,0.25 or 0.5MG /DOS, (OZEMPIC, 0.25 OR 0.5 MG/DOSE,) 2 MG/3ML SOPN Inject 0.5 mg into  the skin once a week. 2 mL 3   tiZANidine (ZANAFLEX) 4 MG tablet Take 1 tablet (4 mg total) by mouth every 6 (six) hours as needed for muscle spasms. 30 tablet 0   No current facility-administered medications on file prior to visit.    Allergies  Allergen Reactions   Dilaudid [Hydromorphone Hcl] Nausea And Vomiting   Cymbalta [Duloxetine Hcl] Nausea And Vomiting   Morphine And Codeine Nausea And Vomiting   Tramadol Nausea Only    If she takes antinausea medicine with this medicine, then she can tolerate it.      Review of Systems ROS Review of Systems - General ROS: negative for - chills, fatigue, fever, hot flashes, night sweats, weight gain or weight loss Psychological ROS: negative for - anxiety, decreased libido, depression, mood swings, physical abuse or sexual abuse Ophthalmic ROS: negative for - blurry vision, eye pain or loss of vision ENT ROS: negative for - headaches, hearing change, visual changes or vocal changes Allergy and Immunology ROS: negative for - hives, itchy/watery eyes or seasonal allergies Hematological and Lymphatic ROS: negative for - bleeding problems, bruising, swollen lymph nodes or weight loss Endocrine ROS: negative for - galactorrhea, hair pattern changes, hot flashes, malaise/lethargy, mood swings, palpitations, polydipsia/polyuria, skin changes, temperature intolerance or unexpected weight changes Breast ROS: negative for - new or changing breast lumps or nipple discharge Respiratory ROS: negative for - cough or shortness of breath Cardiovascular ROS: negative for - chest pain, irregular heartbeat, palpitations or shortness of breath Gastrointestinal ROS: no abdominal pain, change in bowel habits, or black or bloody stools Genito-Urinary ROS: no dysuria, trouble voiding, or hematuria Musculoskeletal ROS: negative for - joint pain or joint stiffness. Positive for left leg pain.  Neurological ROS: negative for - bowel and bladder control  changes Dermatological ROS: negative for rash and skin lesion changes   Objective:   BP (!) 147/82   Pulse 87   Resp 16   Ht 5\' 6"  (1.676 m)   Wt (!) 310 lb 9.6 oz (140.9 kg)   LMP 02/22/2022 (Approximate)   BMI 50.13 kg/m  CONSTITUTIONAL: Well-developed, well-nourished female in no acute distress.  PSYCHIATRIC: Normal mood and affect. Normal behavior. Normal judgment and thought content. NEUROLGIC: Alert and oriented to person, place, and time. Normal muscle tone coordination. No cranial nerve deficit noted. HENT:  Normocephalic, atraumatic, External right and left ear normal. Oropharynx is clear and moist EYES: Conjunctivae and EOM are normal. Pupils are equal, round, and reactive to light. No scleral icterus.  NECK: Normal range of motion, supple, no masses.  Normal thyroid.  SKIN: Skin is warm and dry. No rash noted. Not diaphoretic. No erythema. No pallor. CARDIOVASCULAR: Normal heart rate noted, regular rhythm, no murmur. RESPIRATORY: Clear to auscultation bilaterally. Effort and breath sounds normal, no problems with respiration noted. BREASTS: Symmetric in size. No masses, skin changes, nipple drainage, or lymphadenopathy. ABDOMEN: Soft, normal bowel sounds, no distention noted.  No tenderness, rebound or guarding.  BLADDER: Normal PELVIC:  Bladder no bladder distension noted  Urethra: normal appearing urethra with no masses, tenderness or lesions  Vulva: normal appearing vulva with no masses, tenderness or lesions  Vagina: normal appearing vagina with normal color and discharge, no lesions  Cervix: surgically absent  Uterus: uterus is normal size, shape, consistency and nontender  Adnexa: normal adnexa in size, nontender and no masses  RV: External Exam NormaI, No Rectal Masses, and Normal Sphincter tone  MUSCULOSKELETAL: Normal range of motion. Moderate tenderness of shin, calf, and foot. No cyanosis, clubbing, or edema.  2+ distal pulses.No palpable cords or erythema.   LYMPHATIC: No Axillary, Supraclavicular, or Inguinal Adenopathy.   Labs: Lab Results  Component Value Date   WBC 5.9 04/21/2023   HGB 13.5 04/21/2023   HCT 42.3 04/21/2023   MCV 93.2 04/21/2023   PLT 238 04/21/2023    Lab Results  Component Value Date   CREATININE 1.10 (H) 04/21/2023   BUN 14 04/21/2023   NA 140 04/21/2023   K 3.8 04/21/2023   CL 103 04/21/2023   CO2 28 04/21/2023    Lab Results  Component Value Date   ALT 18 04/21/2023   AST 19 04/21/2023   ALKPHOS 74 04/21/2023   BILITOT 0.3 04/21/2023    Lab Results  Component Value Date   CHOL 185 12/14/2021   HDL 41 (L) 12/14/2021   LDLCALC 122 (H) 12/14/2021   TRIG 109 12/14/2021   CHOLHDL 4.5 12/14/2021    Lab Results  Component Value Date   TSH 1.614 03/15/2022    Lab Results  Component Value Date   HGBA1C 10.0 (A) 02/07/2023     Assessment:   1. Encounter for well woman exam with routine gynecological exam   2. Essential hypertension   3. Morbid obesity with BMI of 50.0-59.9, adult (HCC)   4. Type 2 diabetes mellitus with hyperglycemia, with long-term current use of insulin (HCC)   5. Hyperlipidemia associated with type 2 diabetes mellitus (HCC)   6. Left leg pain      Plan:  - Pap: Not needed, patient is s/p hysterectomy - Mammogram:  UTD - Colon Screening:   UTD - Labs: Labs up to date by PCP and also recent surgery.  - Routine preventative health maintenance measures emphasized:  Self Breast Exam, Exercise/Diet/Weight control, Tobacco Warnings, Alcohol/Substance use risks, and Stress Management - Type II DM, doing well on Ozempic. Managed by PCP.  - Essential HTN, managed with PCP.  - Left leg pain s/p left knee replacement, advised to f/u with Orthopedist.  - Return to Clinic - 1 Year   Hildred Laser, MD [facility]

## 2023-06-24 ENCOUNTER — Encounter: Payer: Self-pay | Admitting: Obstetrics and Gynecology

## 2023-06-24 ENCOUNTER — Encounter: Payer: Self-pay | Admitting: Internal Medicine

## 2023-06-24 ENCOUNTER — Other Ambulatory Visit: Payer: Self-pay | Admitting: Internal Medicine

## 2023-06-24 ENCOUNTER — Ambulatory Visit (INDEPENDENT_AMBULATORY_CARE_PROVIDER_SITE_OTHER): Payer: MEDICAID | Admitting: Obstetrics and Gynecology

## 2023-06-24 VITALS — BP 147/82 | HR 87 | Resp 16 | Ht 66.0 in | Wt 310.6 lb

## 2023-06-24 DIAGNOSIS — E785 Hyperlipidemia, unspecified: Secondary | ICD-10-CM

## 2023-06-24 DIAGNOSIS — Z0000 Encounter for general adult medical examination without abnormal findings: Secondary | ICD-10-CM

## 2023-06-24 DIAGNOSIS — E1165 Type 2 diabetes mellitus with hyperglycemia: Secondary | ICD-10-CM

## 2023-06-24 DIAGNOSIS — M79605 Pain in left leg: Secondary | ICD-10-CM

## 2023-06-24 DIAGNOSIS — I10 Essential (primary) hypertension: Secondary | ICD-10-CM

## 2023-06-24 DIAGNOSIS — Z01419 Encounter for gynecological examination (general) (routine) without abnormal findings: Secondary | ICD-10-CM

## 2023-06-24 MED ORDER — OZEMPIC (0.25 OR 0.5 MG/DOSE) 2 MG/3ML ~~LOC~~ SOPN: 0.5000 mg | PEN_INJECTOR | SUBCUTANEOUS | 1 refills | Status: DC

## 2023-06-24 NOTE — Telephone Encounter (Signed)
Refused this Ozempic because this is a duplicate response.   Sent to the pharmacy earlier today.

## 2023-06-24 NOTE — Patient Instructions (Signed)
Preventive Care 32-55 Years Old, Female Preventive care refers to lifestyle choices and visits with your health care provider that can promote health and wellness. Preventive care visits are also called wellness exams. What can I expect for my preventive care visit? Counseling Your health care provider may ask you questions about your: Medical history, including: Past medical problems. Family medical history. Pregnancy history. Current health, including: Menstrual cycle. Method of birth control. Emotional well-being. Home life and relationship well-being. Sexual activity and sexual health. Lifestyle, including: Alcohol, nicotine or tobacco, and drug use. Access to firearms. Diet, exercise, and sleep habits. Work and work Astronomer. Sunscreen use. Safety issues such as seatbelt and bike helmet use. Physical exam Your health care provider will check your: Height and weight. These may be used to calculate your BMI (body mass index). BMI is a measurement that tells if you are at a healthy weight. Waist circumference. This measures the distance around your waistline. This measurement also tells if you are at a healthy weight and may help predict your risk of certain diseases, such as type 2 diabetes and high blood pressure. Heart rate and blood pressure. Body temperature. Skin for abnormal spots. What immunizations do I need?  Vaccines are usually given at various ages, according to a schedule. Your health care provider will recommend vaccines for you based on your age, medical history, and lifestyle or other factors, such as travel or where you work. What tests do I need? Screening Your health care provider may recommend screening tests for certain conditions. This may include: Lipid and cholesterol levels. Diabetes screening. This is done by checking your blood sugar (glucose) after you have not eaten for a while (fasting). Pelvic exam and Pap test. Hepatitis B test. Hepatitis C  test. HIV (human immunodeficiency virus) test. STI (sexually transmitted infection) testing, if you are at risk. Lung cancer screening. Colorectal cancer screening. Mammogram. Talk with your health care provider about when you should start having regular mammograms. This may depend on whether you have a family history of breast cancer. BRCA-related cancer screening. This may be done if you have a family history of breast, ovarian, tubal, or peritoneal cancers. Bone density scan. This is done to screen for osteoporosis. Talk with your health care provider about your test results, treatment options, and if necessary, the need for more tests. Follow these instructions at home: Eating and drinking  Eat a diet that includes fresh fruits and vegetables, whole grains, lean protein, and low-fat dairy products. Take vitamin and mineral supplements as recommended by your health care provider. Do not drink alcohol if: Your health care provider tells you not to drink. You are pregnant, may be pregnant, or are planning to become pregnant. If you drink alcohol: Limit how much you have to 0-1 drink a day. Know how much alcohol is in your drink. In the [place] one drink equals one 12 oz bottle of beer (355 mL), one 5 oz glass of wine (148 mL), or one 1 oz glass of hard liquor (44 mL). Lifestyle Brush your teeth every morning and night with fluoride toothpaste. Floss one time each day. Exercise for at least 30 minutes 5 or more days each week. Do not use any products that contain nicotine or tobacco. These products include cigarettes, chewing tobacco, and vaping devices, such as e-cigarettes. If you need help quitting, ask your health care provider. Do not use drugs. If you are sexually active, practice safe sex. Use a condom or other form of protection to  prevent STIs. If you do not wish to become pregnant, use a form of birth control. If you plan to become pregnant, see your health care provider for a  prepregnancy visit. Take aspirin only as told by your health care provider. Make sure that you understand how much to take and what form to take. Work with your health care provider to find out whether it is safe and beneficial for you to take aspirin daily. Find healthy ways to manage stress, such as: Meditation, yoga, or listening to music. Journaling. Talking to a trusted person. Spending time with friends and family. Minimize exposure to UV radiation to reduce your risk of skin cancer. Safety Always wear your seat belt while driving or riding in a vehicle. Do not drive: If you have been drinking alcohol. Do not ride with someone who has been drinking. When you are tired or distracted. While texting. If you have been using any mind-altering substances or drugs. Wear a helmet and other protective equipment during sports activities. If you have firearms in your house, make sure you follow all gun safety procedures. Seek help if you have been physically or sexually abused. What's next? Visit your health care provider once a year for an annual wellness visit. Ask your health care provider how often you should have your eyes and teeth checked. Stay up to date on all vaccines. This information is not intended to replace advice given to you by your health care provider. Make sure you discuss any questions you have with your health care provider. Document Revised: 05/06/2021 Document Reviewed: 05/06/2021 Elsevier Patient Education  2024 Elsevier Inc.   Breast Self-Awareness Breast self-awareness means being familiar with how your breasts look and feel. It involves checking your breasts regularly and telling your health care provider about any changes. Practicing breast self-awareness helps to maintain breast health. Sometimes, changes are not harmful (are benign). Other times, a change in your breasts can be a sign of a serious medical problem. Being familiar with the look and feel of your  breasts can help you catch a breast problem while it is still small and can be treated. You should do breast self-exams even if you have breast implants. What you need: A mirror. A well-lit room. A pillow or other soft object. How to do a breast self-exam A breast self-exam is one way to learn what is normal for your breasts and whether your breasts are changing. To do a breast self-exam: Look for changes  Remove all the clothing above your waist. Stand in front of a mirror in a room with good lighting. Put your hands down at your sides. Compare your breasts in the mirror. Look for differences between them (asymmetry), such as: Differences in shape. Differences in size. Puckers, dips, and bumps in one breast and not the other. Look at each breast for changes in the skin, such as: Redness. Scaly areas. Skin thickening. Dimpling. Open sores (ulcers). Look for changes in your nipples, such as: Discharge. Bleeding. Dimpling. Redness. A nipple that looks pushed in (retracted), or that has changed position. Feel for changes Carefully feel your breasts for lumps and changes. It is best to do this self-exam while lying down. Follow these steps to feel each breast: Place a pillow under the shoulder of one side of your body. Place the arm of that side of your body behind your head. Feel the breast of that side of your body using the hand of the opposite arm. To do this: Start  in the nipple area and use the pads of your three middle fingers to make -inch (2 cm) overlapping circles. Use light, medium, and then firm pressure as you feel your breast, gently covering the entire breast area and armpit. Continue the overlapping circles, moving downward over the breast until you feel your ribs below your breast. Then, make circles with your fingers going upward until you reach your collarbone. Next, make circles by moving outward across your breast and into your armpit area. Squeeze the nipple.  Check for discharge and lumps. Repeat steps 1-7 to check your other breast. Sit or stand in the tub or shower. With soapy water on your skin, feel each breast the same way you did when you were lying down. Write down what you find Writing down what you find can help you remember what to discuss with your health care provider. Write down: What is normal for each breast. Any changes that you find in each breast. These include: The kind of changes you find. Any pain or tenderness. Size and location of any lumps. Where you are in your menstrual cycle, if you are still getting your menstrual period (menstruating). General tips If you are breastfeeding, the best time to examine your breasts is after a feeding or after using a breast pump. If you menstruate, the best time to examine your breasts is 5-7 days after your menstrual period. Breasts are generally lumpier during menstrual periods, and it may be more difficult to notice changes. With time and practice, you will become more familiar with the differences in your breasts and more comfortable with the exam. Contact a health care provider if: You see a change in the shape or size of your breasts or nipples. You see a change in the skin of your breast or nipples, such as a reddened or scaly area. You have unusual discharge from your nipples. You find a new lump or thick area. You have breast pain. You have any concerns about your breast health. Summary Breast self-awareness includes looking for physical changes in your breasts and feeling for any changes within your breasts. Breast self-awareness should be done in front of a mirror in a well-lit room. If you menstruate, the best time to examine your breasts is 5-7 days after your menstrual period. Tell your health care provider about any changes you notice in your breasts. Changes include changes in size, changes on the skin, pain or tenderness, or unusual fluid from your nipples. This  information is not intended to replace advice given to you by your health care provider. Make sure you discuss any questions you have with your health care provider. Document Revised: 04/15/2022 Document Reviewed: 09/10/2021 Elsevier Patient Education  2024 [employer].

## 2023-07-22 NOTE — Progress Notes (Unsigned)
Established Patient Office Visit  Subjective:  Patient ID: Brenda Hicks, female    DOB: [birth year]  Age: 57 y.o. MRN: [id number]  CC:  No chief complaint on file.   HPI Brenda Hicks presents for follow up on diabetes.    Leg Swelling: -Was seen by neurosurgery yesterday and was having increased edema in her left leg with pain -Vas [place] negative for acute DVT 02/16/23  Hypertension, OSA: -Medications: Currently on Lisinopril 40 mg  -Patient is compliant with medications and reports no side effects.  -Checking BP at home: no -Denies any SOB, CP, LE edema, medication SEs, or symptoms of hypotension   Diabetes, Type 2 -Last A1c 7.7% 5/24 -Medications: Lantus 30 units BID, Humalog 30 units TID, Trulicity was prescribed as well, apparently there are supply issues with the pharmacy so this was never filled -Diet: trying to eat more healthy - mainly eats breakfast like toast and fruit and then a larger meal at dinner  -Exercise: None right now due to her back pain and knee pain. Has knee surgery scheduled -Blood sugars: going low, as low as 52 in the middle of the night. Was having some lows in the 70-80 range in the middle of the day -Eye exam: UTD 1/24 -Foot exam: UTD 9/23 -Microalbumin: UTD 12/23 on ACEI -Statin: yes -PNA vaccine: Prevnar 23 in 2014  Health Maintenance: -Blood work UTD -Mammogram 1/24 Birads-1  Past Medical History:  Diagnosis Date   Anemia    vitamin d deficiency   Anxiety    Arthritis    Asthma    WELL CONTROLLED   Cancer of ear    skin cancer left ear   COPD (chronic obstructive pulmonary disease) (HCC)    Depression    Diabetes mellitus without complication (HCC)    Fatty liver    Hypertension    Kidney cysts    per patient, never had   Renal disorder    Sleep apnea    DOES NOT USE CPAP. waiting for new machine and a new sleep study   Stroke [facility]) May or June 2019   TIA. no residual symptoms    Past Surgical History:  Procedure  Laterality Date   ABDOMINAL HYSTERECTOMY     ANTERIOR CERVICAL DECOMP/DISCECTOMY FUSION N/A 08/09/2018   Procedure: ANTERIOR CERVICAL DECOMPRESSION/DISCECTOMY FUSION 1 LEVEL- C4-5;  Surgeon: Venetia Night, MD;  Location: [facility] ORS;  Service: Neurosurgery;  Laterality: N/A;   BACK SURGERY     NECK   COLONOSCOPY WITH PROPOFOL N/A 02/26/2021   Procedure: COLONOSCOPY WITH PROPOFOL;  Surgeon: Wyline Mood, MD;  Location: [facility] ENDOSCOPY;  Service: Endoscopy;  Laterality: N/A;   DILATATION & CURETTAGE/HYSTEROSCOPY WITH MYOSURE N/A 01/19/2021   Procedure: DILATATION & CURETTAGE/HYSTEROSCOPY;  Surgeon: Hildred Laser, MD;  Location: [facility] ORS;  Service: Gynecology;  Laterality: N/A;   DILATION AND CURETTAGE OF UTERUS     ENDOMETRIAL ABLATION N/A 01/19/2021   Procedure: ENDOMETRIAL ABLATION, MINERVA;  Surgeon: Hildred Laser, MD;  Location: [facility] ORS;  Service: Gynecology;  Laterality: N/A;   ENDOMETRIAL BIOPSY     benign   ESOPHAGOGASTRODUODENOSCOPY (EGD) WITH PROPOFOL N/A 08/24/2022   Procedure: ESOPHAGOGASTRODUODENOSCOPY (EGD) WITH PROPOFOL;  Surgeon: Toney Reil, MD;  Location: [facility] ENDOSCOPY;  Service: Gastroenterology;  Laterality: N/A;   EXPLORATORY LAPAROTOMY  1992   REMOVAL OF RUPTURED ECTOPIC   HAND SURGERY Right 1998   cyst removed   HERNIA REPAIR  1975   UMBILICAL   JOINT REPLACEMENT Right 2014   TKR  KNEE ARTHROSCOPY Right 2012   KNEE SURGERY Right 2014   total knee replacement   ROBOTIC ASSISTED LAPAROSCOPIC HYSTERECTOMY AND SALPINGECTOMY Bilateral 03/15/2022   Procedure: XI ROBOTIC ASSISTED LAPAROSCOPIC HYSTERECTOMY;  Surgeon: Hildred Laser, MD;  Location: [facility] ORS;  Service: Gynecology;  Laterality: Bilateral;   SHOULDER ARTHROSCOPY WITH BICEPSTENOTOMY Left 12/14/2016   Procedure: SHOULDER ARTHROSCOPY WITH BICEPSTENOTOMY;  Surgeon: Christena Flake, MD;  Location: [facility] OR[facility];  Service: Orthopedics;  Laterality: Left;   SHOULDER ARTHROSCOPY WITH OPEN ROTATOR CUFF REPAIR Left  12/14/2016   Procedure: SHOULDER ARTHROSCOPY WITH OPEN ROTATOR CUFF REPAIR AND ARTHROSCOPIC ROTATOR CUFF REPAIR;  Surgeon: Christena Flake, MD;  Location: [facility] ORS;  Service: Orthopedics;  Laterality: Left;   SHOULDER ARTHROSCOPY WITH ROTATOR CUFF REPAIR Right 01/04/2019   Procedure: SHOULDER ARTHROSCOPY WITH ROTATOR CUFF REPAIR;  Surgeon: Christena Flake, MD;  Location: [facility] OR[facility];  Service: Orthopedics;  Laterality: Right;   SHOULDER ARTHROSCOPY WITH SUBACROMIAL DECOMPRESSION Left 12/14/2016   Procedure: SHOULDER ARTHROSCOPY WITH SUBACROMIAL DECOMPRESSION;  Surgeon: Christena Flake, MD;  Location: [facility];  Service: Orthopedics;  Laterality: Left;   SHOULDER ARTHROSCOPY WITH SUBACROMIAL DECOMPRESSION AND BICEP TENDON REPAIR Right 01/04/2019   Procedure: SHOULDER ARTHROSCOPY WITH DEBRIDEMENT AND SUBACROMIAL DECOMPRESSION-RIGHT;  Surgeon: Christena Flake, MD;  Location: [facility];  Service: Orthopedics;  Laterality: Right;   TOTAL KNEE ARTHROPLASTY Left 04/26/2023   Procedure: TOTAL KNEE ARTHROPLASTY;  Surgeon: Christena Flake, MD;  Location: [facility] ORS;  Service: Orthopedics;  Laterality: Left;   TUBAL LIGATION      Family History  Problem Relation Age of Onset   Hypertension Mother    Thyroid disease Mother    Lupus Mother    Congestive Heart Failure Mother    Hypertension Brother    CAD Maternal Grandmother    Breast cancer Neg Hx    Ovarian cancer Neg Hx    Colon cancer Neg Hx     Social History   Socioeconomic History   Marital status: Married    Spouse name: Brenda Hicks   Number of children: 7   Years of education: Not on file   Highest education level: 11th grade  Occupational History   Occupation: disabled    Comment: disabled  Tobacco Use   Smoking status: Former    Current packs/day: 0.00    Average packs/day: 2.0 packs/day for 25.0 years (50.0 ttl pk-yrs)    Types: Cigarettes    Start date: 06/09/1992    Quit date: 06/09/2017    Years since quitting: 6.1   Smokeless tobacco: Never   Vaping Use   Vaping status: Never Used  Substance and Sexual Activity   Alcohol use: No   Drug use: Yes    Frequency: 7.0 times per week    Types: Marijuana    Comment: pt states as needed when she is hurting real bad   Sexual activity: Yes    Partners: Male    Birth control/protection: None  Other Topics Concern   Not on file  Social History Narrative   Have 6 biological childrean and custody of niece since 19 months.   Social Determinants of Health   Financial Resource Strain: Low Risk  (02/16/2023)   Overall Financial Resource Strain (CARDIA)    Difficulty of Paying Living Expenses: Not very hard  Food Insecurity: Food Insecurity Present (04/26/2023)   Hunger Vital Sign    Worried About Running Out of Food in the Last Year: Never true    Ran Out of Food  in the Last Year: Sometimes true  Transportation Needs: No Transportation Needs (04/26/2023)   PRAPARE - Administrator, Civil Service (Medical): No    Lack of Transportation (Non-Medical): No  Physical Activity: Unknown (02/16/2023)   Exercise Vital Sign    Days of Exercise per Week: Patient declined    Minutes of Exercise per Session: Not on file  Stress: No Stress Concern Present (02/16/2023)   [employer] of Occupational Health - Occupational Stress Questionnaire    Feeling of Stress : Only a little  Social Connections: Moderately Isolated (02/16/2023)   Social Connection and Isolation Panel [NHANES]    Frequency of Communication with Friends and Family: Once a week    Frequency of Social Gatherings with Friends and Family: Never    Attends Religious Services: More than 4 times per year    Active Member of [employer] or Organizations: No    Attends Engineer, structural: Not on file    Marital Status: Married  Catering manager Violence: Not At Risk (04/26/2023)   Humiliation, Afraid, Rape, and Kick questionnaire    Fear of Current or Ex-Partner: No    Emotionally Abused: No    Physically Abused: No     Sexually Abused: No    Outpatient Medications Prior to Visit  Medication Sig Dispense Refill   albuterol (VENTOLIN HFA) 108 (90 Base) MCG/ACT inhaler Inhale 2 puffs into the lungs every 6 (six) hours as needed for wheezing or shortness of breath. 18 g 3   apixaban (ELIQUIS) 2.5 MG TABS tablet Take 1 tablet (2.5 mg total) by mouth 2 (two) times daily. 30 tablet 0   atorvastatin (LIPITOR) 40 MG tablet Take 1 tablet (40 mg total) by mouth daily. 90 tablet 3   docusate sodium (COLACE) 100 MG capsule Take 1 capsule (100 mg total) by mouth 2 (two) times daily as needed. 30 capsule 2   Glucagon (GVOKE HYPOPEN 1-PACK) 1 MG/0.2ML SOAJ Inject 1 mg into the skin as needed (hypogylcemia). 0.2 mL 2   glucose blood (ACCU-CHEK GUIDE) test strip Test blood sugar tid, Dx E11.65 100 each 12   insulin lispro (HUMALOG) 100 UNIT/ML injection Inject 0.05 mLs (5 Units total) into the skin 3 (three) times daily before meals. 5 units in am, and 10 unit with lunch and dinner     ondansetron (ZOFRAN) 4 MG tablet Take 1 tablet (4 mg total) by mouth every 6 (six) hours as needed for nausea. 30 tablet 0   oxyCODONE (ROXICODONE) 5 MG immediate release tablet Take 1-2 tablets (5-10 mg total) by mouth every 4 (four) hours as needed for severe pain. 40 tablet 0   Semaglutide,0.25 or 0.5MG /DOS, (OZEMPIC, 0.25 OR 0.5 MG/DOSE,) 2 MG/3ML SOPN Inject 0.5 mg into the skin once a week. 2 mL 1   tiZANidine (ZANAFLEX) 4 MG tablet Take 1 tablet (4 mg total) by mouth every 6 (six) hours as needed for muscle spasms. 30 tablet 0   No facility-administered medications prior to visit.    Allergies  Allergen Reactions   Dilaudid [Hydromorphone Hcl] Nausea And Vomiting   Cymbalta [Duloxetine Hcl] Nausea And Vomiting   Morphine And Codeine Nausea And Vomiting   Tramadol Nausea Only    If she takes antinausea medicine with this medicine, then she can tolerate it.    ROS Review of Systems  All other systems reviewed and are  negative.     Objective:    Physical Exam Constitutional:      Appearance:  Normal appearance. She is obese.  HENT:     Head: Normocephalic and atraumatic.  Eyes:     Conjunctiva/sclera: Conjunctivae normal.  Cardiovascular:     Rate and Rhythm: Normal rate and regular rhythm.  Pulmonary:     Effort: Pulmonary effort is normal.     Breath sounds: Normal breath sounds.  Musculoskeletal:     Right lower leg: Edema present.     Left lower leg: Edema present.     Comments: 1+ BLE pitting edema, slightly worse on the left  Skin:    General: Skin is warm and dry.  Neurological:     General: No focal deficit present.     Mental Status: She is alert. Mental status is at baseline.  Psychiatric:        Mood and Affect: Mood normal.        Behavior: Behavior normal.     LMP 02/22/2022 (Approximate)  Wt Readings from Last 3 Encounters:  06/24/23 (!) 310 lb 9.6 oz (140.9 kg)  04/26/23 (!) 317 lb (143.8 kg)  04/21/23 (!) 316 lb 9.3 oz (143.6 kg)     Health Maintenance Due  Topic Date Due   Zoster Vaccines- Shingrix (1 of 2) Never done   COVID-19 Vaccine (6 - 2023-24 season) 07/23/2022   Lung Cancer Screening  01/29/2023   OPHTHALMOLOGY EXAM  03/05/2023   INFLUENZA VACCINE  06/23/2023    There are no preventive care reminders to display for this patient.  Lab Results  Component Value Date   TSH 1.614 03/15/2022   Lab Results  Component Value Date   WBC 5.9 04/21/2023   HGB 13.5 04/21/2023   HCT 42.3 04/21/2023   MCV 93.2 04/21/2023   PLT 238 04/21/2023   Lab Results  Component Value Date   NA 140 04/21/2023   K 3.8 04/21/2023   CO2 28 04/21/2023   GLUCOSE 124 (H) 04/21/2023   BUN 14 04/21/2023   CREATININE 1.10 (H) 04/21/2023   BILITOT 0.3 04/21/2023   ALKPHOS 74 04/21/2023   AST 19 04/21/2023   ALT 18 04/21/2023   PROT 7.6 04/21/2023   ALBUMIN 3.8 04/21/2023   CALCIUM 8.8 (L) 04/21/2023   ANIONGAP 9 04/21/2023   EGFR 79 01/18/2022   Lab Results   Component Value Date   CHOL 185 12/14/2021   Lab Results  Component Value Date   HDL 41 (L) 12/14/2021   Lab Results  Component Value Date   LDLCALC 122 (H) 12/14/2021   Lab Results  Component Value Date   TRIG 109 12/14/2021   Lab Results  Component Value Date   CHOLHDL 4.5 12/14/2021   Lab Results  Component Value Date   HGBA1C 10.0 (A) 02/07/2023      Assessment & Plan:   1. Type 2 diabetes mellitus with hyperglycemia, with long-term current use of insulin (HCC): A1c uncontrolled however recently having lower blood sugars throughout the day, this must be due to change in diet. Decrease Lantus to 20 units at night but continue 30 units in the morning. Since her morning meal is smaller, decrease Humalog to 10 units and decrease to 20 units with dinner. Will try low dose Ozempic as well and follow up in 1 month. Refill test strips.   - Semaglutide,0.25 or 0.5MG /DOS, (OZEMPIC, 0.25 OR 0.5 MG/DOSE,) 2 MG/3ML SOPN; Inject 0.5 mg into the skin once a week.  Dispense: 2 mL; Refill: 3 - glucose blood (ACCU-CHEK GUIDE) test strip; Use as instructed  Dispense: 100  each; Refill: 12 - Glucagon (GVOKE HYPOPEN 1-PACK) 1 MG/0.2ML SOAJ; Inject 1 mg into the skin as needed (hypogylcemia).  Dispense: 0.2 mL; Refill: 2  2. Multiple episodes of hypoglycemia: Discussed eating strategies to try to avoid hypoglycemia, will try to increase protein to help keep blood sugar levels stable. Will prescribe glucagon pen to use in emergencies.   - Glucagon (GVOKE HYPOPEN 1-PACK) 1 MG/0.2ML SOAJ; Inject 1 mg into the skin as needed (hypogylcemia).  Dispense: 0.2 mL; Refill: 2  3. Lower extremity edema: [place] negative for DVT, 1+ pitting edema on exam. Keep legs elevated and recommend she go to [employer] for customized compression stockings.    Follow-up: No follow-ups on file.    Margarita Mail, DO

## 2023-07-26 ENCOUNTER — Encounter: Payer: Self-pay | Admitting: Pharmacist

## 2023-07-27 ENCOUNTER — Encounter: Payer: Self-pay | Admitting: Internal Medicine

## 2023-07-27 ENCOUNTER — Ambulatory Visit (INDEPENDENT_AMBULATORY_CARE_PROVIDER_SITE_OTHER): Payer: MEDICAID | Admitting: Internal Medicine

## 2023-07-27 VITALS — BP 136/88 | HR 100 | Temp 98.1°F | Ht 66.0 in | Wt 312.3 lb

## 2023-07-27 DIAGNOSIS — B9562 Methicillin resistant Staphylococcus aureus infection as the cause of diseases classified elsewhere: Secondary | ICD-10-CM

## 2023-07-27 DIAGNOSIS — M62838 Other muscle spasm: Secondary | ICD-10-CM

## 2023-07-27 DIAGNOSIS — Z794 Long term (current) use of insulin: Secondary | ICD-10-CM | POA: Diagnosis not present

## 2023-07-27 DIAGNOSIS — L02419 Cutaneous abscess of limb, unspecified: Secondary | ICD-10-CM | POA: Diagnosis not present

## 2023-07-27 DIAGNOSIS — L0390 Cellulitis, unspecified: Secondary | ICD-10-CM | POA: Diagnosis not present

## 2023-07-27 DIAGNOSIS — E1165 Type 2 diabetes mellitus with hyperglycemia: Secondary | ICD-10-CM | POA: Diagnosis not present

## 2023-07-27 LAB — POCT GLYCOSYLATED HEMOGLOBIN (HGB A1C): Hemoglobin A1C: 7.5 % — AB (ref 4.0–5.6)

## 2023-07-27 MED ORDER — CYCLOBENZAPRINE HCL 5 MG PO TABS
5.0000 mg | ORAL_TABLET | Freq: Every evening | ORAL | 3 refills | Status: DC | PRN
Start: 2023-07-27 — End: 2024-01-04

## 2023-07-30 LAB — WOUND CULTURE
MICRO NUMBER:: 15421322
SPECIMEN QUALITY:: ADEQUATE

## 2023-07-31 ENCOUNTER — Encounter: Payer: Self-pay | Admitting: Internal Medicine

## 2023-08-01 MED ORDER — SULFAMETHOXAZOLE-TRIMETHOPRIM 800-160 MG PO TABS
2.0000 | ORAL_TABLET | Freq: Two times a day (BID) | ORAL | 0 refills | Status: AC
Start: 2023-08-01 — End: 2023-08-06

## 2023-08-01 NOTE — Addendum Note (Signed)
Addended by: Margarita Mail on: 08/01/2023 08:50 AM   Modules accepted: Orders

## 2023-08-05 MED ORDER — SEMAGLUTIDE (1 MG/DOSE) 4 MG/3ML ~~LOC~~ SOPN
1.0000 mg | PEN_INJECTOR | SUBCUTANEOUS | 2 refills | Status: AC
Start: 2023-08-05 — End: ?

## 2023-08-05 NOTE — Addendum Note (Signed)
Addended by: Margarita Mail on: 08/05/2023 07:52 AM   Modules accepted: Orders

## 2023-08-10 ENCOUNTER — Other Ambulatory Visit: Payer: Self-pay | Admitting: Internal Medicine

## 2023-08-10 DIAGNOSIS — B9562 Methicillin resistant Staphylococcus aureus infection as the cause of diseases classified elsewhere: Secondary | ICD-10-CM

## 2023-08-10 MED ORDER — CHLORHEXIDINE GLUCONATE 4 % EX SOLN
Freq: Every day | CUTANEOUS | 2 refills | Status: DC | PRN
Start: 2023-08-10 — End: 2024-01-04

## 2023-08-22 ENCOUNTER — Other Ambulatory Visit: Payer: Self-pay | Admitting: Internal Medicine

## 2023-08-22 DIAGNOSIS — E1165 Type 2 diabetes mellitus with hyperglycemia: Secondary | ICD-10-CM

## 2023-08-23 NOTE — Telephone Encounter (Signed)
Requested Prescriptions  Refused Prescriptions Disp Refills   OZEMPIC, 0.25 OR 0.5 MG/DOSE, 2 MG/3ML SOPN [Pharmacy Med Name: OZEMPIC 0.25 OR 0.5MG  DOS(2MG /3ML)] 3 mL     Sig: INJECT 0.5MG  INTO THE SKIN ONCE A WEEK     Endocrinology:  Diabetes - GLP-1 Receptor Agonists - semaglutide Failed - 08/22/2023 11:54 AM      Failed - HBA1C in normal range and within 180 days    Hemoglobin A1C  Date Value Ref Range Status  07/27/2023 7.5 (A) 4.0 - 5.6 % Final  12/27/2012 10.1 (H) 4.2 - 6.3 % Final    Comment:    The American Diabetes Association recommends that a primary goal of therapy should be <7% and that physicians should reevaluate the treatment regimen in patients with HbA1c values consistently >8%.    Hgb A1c MFr Bld  Date Value Ref Range Status  03/15/2022 7.8 (H) 4.8 - 5.6 % Final    Comment:    (NOTE) Pre diabetes:          5.7%-6.4%  Diabetes:              >6.4%  Glycemic control for   <7.0% adults with diabetes          Failed - Cr in normal range and within 360 days    Creat  Date Value Ref Range Status  04/25/2020 1.00 0.50 - 1.05 mg/dL Final    Comment:    For patients >74 years of age, the reference limit for Creatinine is approximately 13% higher for people identified as African-American. .    Creatinine, Ser  Date Value Ref Range Status  04/21/2023 1.10 (H) 0.44 - 1.00 mg/dL Final   Creatinine, Urine  Date Value Ref Range Status  10/26/2022 36 20 - 275 mg/dL Final         Passed - Valid encounter within last 6 months    Recent Outpatient Visits           3 weeks ago Type 2 diabetes mellitus with hyperglycemia, with long-term current use of insulin [facility])   [facility] Margarita Mail, DO   6 months ago Type 2 diabetes mellitus with hyperglycemia, with long-term current use of insulin [facility])   [facility] Margarita Mail, DO   6 months ago Type 2 diabetes mellitus with hyperglycemia, with  long-term current use of insulin [facility])   [facility] Margarita Mail, DO   8 months ago Abscess   [facility] Della Goo F, FNP   8 months ago Type 2 diabetes mellitus with hyperglycemia, with long-term current use of insulin [facility])   [facility] Margarita Mail, DO       Future Appointments             In 2 months Margarita Mail, DO [facility], [facility]

## 2023-08-25 ENCOUNTER — Other Ambulatory Visit: Payer: Self-pay | Admitting: Internal Medicine

## 2023-08-25 DIAGNOSIS — E1165 Type 2 diabetes mellitus with hyperglycemia: Secondary | ICD-10-CM

## 2023-08-25 MED ORDER — SEMAGLUTIDE (1 MG/DOSE) 4 MG/3ML ~~LOC~~ SOPN: 1.0000 mg | PEN_INJECTOR | SUBCUTANEOUS | 2 refills | Status: DC

## 2023-08-29 ENCOUNTER — Encounter: Payer: Self-pay | Admitting: Nurse Practitioner

## 2023-08-29 ENCOUNTER — Ambulatory Visit (INDEPENDENT_AMBULATORY_CARE_PROVIDER_SITE_OTHER): Payer: MEDICAID | Admitting: Nurse Practitioner

## 2023-08-29 ENCOUNTER — Other Ambulatory Visit: Payer: Self-pay

## 2023-08-29 VITALS — BP 130/84 | HR 98 | Temp 97.9°F | Resp 18 | Ht 66.0 in | Wt 311.6 lb

## 2023-08-29 DIAGNOSIS — J069 Acute upper respiratory infection, unspecified: Secondary | ICD-10-CM

## 2023-08-29 DIAGNOSIS — J029 Acute pharyngitis, unspecified: Secondary | ICD-10-CM | POA: Diagnosis not present

## 2023-08-29 LAB — POCT RAPID STREP A (OFFICE): Rapid Strep A Screen: NEGATIVE

## 2023-08-29 MED ORDER — AMOXICILLIN-POT CLAVULANATE 875-125 MG PO TABS
1.0000 | ORAL_TABLET | Freq: Two times a day (BID) | ORAL | 0 refills | Status: DC
Start: 2023-08-29 — End: 2024-01-04

## 2023-08-29 MED ORDER — BENZONATATE 100 MG PO CAPS
200.0000 mg | ORAL_CAPSULE | Freq: Two times a day (BID) | ORAL | 0 refills | Status: DC | PRN
Start: 2023-08-29 — End: 2024-02-08

## 2023-08-29 MED ORDER — PROMETHAZINE-DM 6.25-15 MG/5ML PO SYRP
5.0000 mL | ORAL_SOLUTION | Freq: Four times a day (QID) | ORAL | 0 refills | Status: DC | PRN
Start: 2023-08-29 — End: 2024-01-04

## 2023-08-29 NOTE — Progress Notes (Signed)
BP 130/84   Pulse 98   Temp 97.9 F (36.6 C) (Oral)   Resp 18   Ht 5\' 6"  (1.676 m)   Wt (!) 311 lb 9.6 oz (141.3 kg)   LMP 02/22/2022 (Approximate)   SpO2 98%   BMI 50.29 kg/m    Subjective:    Patient ID: Brenda Hicks, female    DOB: [birth year], 57 y.o.   MRN: [id number]  HPI: Brenda Hicks is a 57 y.o. female  Chief Complaint  Patient presents with   Sore Throat    Congested, cough, chills for 5 days   Sore throat/URI Compliant: symptoms started five days ago -Fever: not documented but subjective fever -Cough: yes -Shortness of breath: yes -Wheezing: yes -Chest congestion: yes -Nasal congestion: yes -Runny nose: yes -Post nasal drip: yes -Sore throat: yes -Sinus pressure: yes -Headache: yes -Face pain: yes -Ear pain:  yes -Ear pressure: yes -chills: yes -Relief with OTC cold/cough medications: throat lozenges, nyquil  Recommend taking zyrtec, flonase, mucinex, vitamin d, vitamin c, and zinc. Push fluids and get rest.    Can gargle with salt water, drink hot tea with honey, use throat lozenges.    Relevant past medical, surgical, family and social history reviewed and updated as indicated. Interim medical history since our last visit reviewed. Allergies and medications reviewed and updated.  Review of Systems Ten systems reviewed and is negative except as mentioned in HPI      Objective:    BP 130/84   Pulse 98   Temp 97.9 F (36.6 C) (Oral)   Resp 18   Ht 5\' 6"  (1.676 m)   Wt (!) 311 lb 9.6 oz (141.3 kg)   LMP 02/22/2022 (Approximate)   SpO2 98%   BMI 50.29 kg/m   Wt Readings from Last 3 Encounters:  08/29/23 (!) 311 lb 9.6 oz (141.3 kg)  07/27/23 (!) 312 lb 4.8 oz (141.7 kg)  06/24/23 (!) 310 lb 9.6 oz (140.9 kg)    Physical Exam  Constitutional: Patient appears well-developed and well-nourished. Obese  No distress.  HEENT: head atraumatic, normocephalic, pupils equal and reactive to light, ears TMs clear, neck supple, throat  erythematous with exudate, mild lymphadenopathy.  Cardiovascular: Normal rate, regular rhythm and normal heart sounds.  No murmur heard. No BLE edema. Pulmonary/Chest: Effort normal and breath sounds normal. No respiratory distress. Abdominal: Soft.  There is no tenderness. Psychiatric: Patient has a normal mood and affect. behavior is normal. Judgment and thought content normal.     Assessment & Plan:   Problem List Items Addressed This Visit   None Visit Diagnoses     Sore throat    -  Primary   rapid strep negative, however exam was suspicious for strep.  will treat with augmentin,  gargle with salt water, drink hot tea with honey, use throat lozenges.   Relevant Medications   amoxicillin-clavulanate (AUGMENTIN) 875-125 MG tablet   benzonatate (TESSALON) 100 MG capsule   promethazine-dextromethorphan (PROMETHAZINE-DM) 6.25-15 MG/5ML syrup   Other Relevant Orders   POCT rapid strep A (Completed)   Novel Coronavirus, NAA ([facility])   Viral upper respiratory tract infection       pending covid test although out of treatment window.  send in tesssalon perls and phenergan-DM zyrtec, flonase, mucinex, vitamin d, vitamin c, and zinc.   Relevant Medications   benzonatate (TESSALON) 100 MG capsule   promethazine-dextromethorphan (PROMETHAZINE-DM) 6.25-15 MG/5ML syrup        Follow up plan: Return if  symptoms worsen or fail to improve.

## 2023-08-30 LAB — NOVEL CORONAVIRUS, NAA: SARS-CoV-2, NAA: NOT DETECTED

## 2023-08-30 LAB — SPECIMEN STATUS REPORT

## 2023-09-02 ENCOUNTER — Encounter: Payer: Self-pay | Admitting: Internal Medicine

## 2023-09-19 ENCOUNTER — Telehealth: Payer: Self-pay | Admitting: Orthopedic Surgery

## 2023-09-19 NOTE — Telephone Encounter (Signed)
Patient left a message with the answering service and was triaged and advised to go to ED.

## 2023-09-19 NOTE — Telephone Encounter (Signed)
Dr Binnie Rail note was not available at the time of the original message. It is available now. Pt was told by triage nurse to proceed to ER.  Dr Poggi's note 09/19/23:  "EMG results: A recent EMG of her bilateral lower extremities is available for review and has been reviewed by myself. By report, the study demonstrates "an abnormal electrodiagnostic study consistent with a 1) generalized sensorimotor polyneuropathy. 2) no electrodiagnostic evidence of focal mononeuropathy of the left tibial/peroneal nerve." This report was reviewed by myself and discussed with the patient.   Plan: The treatment options were discussed with the patient. In addition, patient educational materials were provided regarding the diagnosis and treatment options. The patient is quite frustrated by her symptoms and functional limitations. Despite the results of the EMG, I remain concerned that she may have some irritation of the peroneal nerve based on her examination findings. Therefore, I have recommended that she be evaluated by Dr. Ernestine Mcmurray, a neurosurgeon specializing in peripheral nerve issues. Meanwhile, the patient may continue with her normal daily activities and home exercises, but is to avoid offending activities. She may continue her present medication regimen. All of the patient's questions and concerns were answered. She can call any time with further concerns. She will follow up in 2 to 3 months for re-evaluation. X-rays of her left knee will be obtained on return. Electronically signed by Ernesto Rutherford, MD at 09/19/2023 5:21 PM EDT "

## 2023-09-20 NOTE — Telephone Encounter (Signed)
If she is still having difficulty urinating then I recommend she go to ED. She may have UTI.

## 2023-09-20 NOTE — Telephone Encounter (Signed)
Spoke to patient and she states she feel like her bladder is full and when she goes to the bathroom only a few dribbles come out. She states she was unable to get to the ED yesterday. She could not find a ride. Still having the low back pain going the left leg to the toes. She states her toes are numb. She said when she puts her foot on the floor she can't feel the floor. Her knee is swollen all around the whole knee.

## 2023-09-20 NOTE — Telephone Encounter (Signed)
Patient notified and voiced understanding.

## 2023-09-20 NOTE — Telephone Encounter (Signed)
Please call patient to check on her. Looks like she has appt with Dr. Katrinka Blazing on 11/13.   She mentioned urinary issues in her call to answering service yesterday. She did not go to ED as recommended.   Any incontinence? Any numbness when she wipes? Any bowel issues?

## 2023-09-30 ENCOUNTER — Inpatient Hospital Stay
Admission: RE | Admit: 2023-09-30 | Discharge: 2023-09-30 | Disposition: A | Discharge status: Home / Self Care | Payer: Self-pay | Source: Ambulatory Visit | Attending: Neurosurgery | Admitting: Neurosurgery

## 2023-09-30 ENCOUNTER — Other Ambulatory Visit: Payer: Self-pay | Admitting: Family Medicine

## 2023-09-30 DIAGNOSIS — Z049 Encounter for examination and observation for unspecified reason: Secondary | ICD-10-CM
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
## 2023-10-05 ENCOUNTER — Encounter: Payer: Self-pay | Admitting: Neurosurgery

## 2023-10-05 ENCOUNTER — Ambulatory Visit: Payer: MEDICAID | Admitting: Neurosurgery

## 2023-10-05 VITALS — BP 132/86 | Ht 66.0 in | Wt 310.0 lb

## 2023-10-05 DIAGNOSIS — R202 Paresthesia of skin: Secondary | ICD-10-CM

## 2023-10-05 DIAGNOSIS — M79662 Pain in left lower leg: Secondary | ICD-10-CM

## 2023-10-05 DIAGNOSIS — Z96653 Presence of artificial knee joint, bilateral: Secondary | ICD-10-CM | POA: Diagnosis not present

## 2023-10-05 DIAGNOSIS — Z96659 Presence of unspecified artificial knee joint: Secondary | ICD-10-CM

## 2023-10-05 DIAGNOSIS — E1142 Type 2 diabetes mellitus with diabetic polyneuropathy: Secondary | ICD-10-CM

## 2023-10-25 NOTE — Progress Notes (Unsigned)
Established Patient Office Visit  Subjective:  Patient ID: Brenda Hicks, female    DOB: May 31, 1966  Age: 57 y.o. MRN: [id number]  CC:  No chief complaint on file.   HPI Brenda Hicks presents for follow up on diabetes.  Had her left knee replaced in June, still walking with a walker, having some neuropathy.  Hypertension, OSA: -Medications: Currently on Lisinopril 40 mg  -Patient is compliant with medications and reports no side effects.  -Checking BP at home: no -Denies any SOB, CP, LE edema, medication SEs, or symptoms of hypotension   Diabetes, Type 2 -Last A1c 7.5% 9/24 -Medications: Had been on Lantus and Humalog but was having low sugars, so she discontinued her insulin back in May. Now only on Ozempic 1 mg, had an issue with availability but has it now -Diet: appetite decreased, working on eating healthy foods -Exercise: None right now due to knee pain -Blood sugars: no more low's -Eye exam: UTD 1/24 -Foot exam: UTD 9/23 -Microalbumin: UTD 12/23 on ACEI -Statin: yes -PNA vaccine: Prevnar 23 in 2014  Health Maintenance: -Blood work UTD -Mammogram 1/24 Birads-1  Past Medical History:  Diagnosis Date   Anemia    vitamin d deficiency   Anxiety    Arthritis    Asthma    WELL CONTROLLED   Cancer of ear    skin cancer left ear   COPD (chronic obstructive pulmonary disease) (HCC)    Depression    Diabetes mellitus without complication (HCC)    Fatty liver    Hypertension    Kidney cysts    per patient, never had   Renal disorder    Sleep apnea    DOES NOT USE CPAP. waiting for new machine and a new sleep study   Stroke [facility]) May or June 2019   TIA. no residual symptoms    Past Surgical History:  Procedure Laterality Date   ABDOMINAL HYSTERECTOMY     ANTERIOR CERVICAL DECOMP/DISCECTOMY FUSION N/A 08/09/2018   Procedure: ANTERIOR CERVICAL DECOMPRESSION/DISCECTOMY FUSION 1 LEVEL- C4-5;  Surgeon: Venetia Night, MD;  Location: [facility] ORS;  Service:  Neurosurgery;  Laterality: N/A;   BACK SURGERY     NECK   COLONOSCOPY WITH PROPOFOL N/A 02/26/2021   Procedure: COLONOSCOPY WITH PROPOFOL;  Surgeon: Wyline Mood, MD;  Location: [facility];  Service: Endoscopy;  Laterality: N/A;   DILATATION & CURETTAGE/HYSTEROSCOPY WITH MYOSURE N/A 01/19/2021   Procedure: DILATATION & CURETTAGE/HYSTEROSCOPY;  Surgeon: Hildred Laser, MD;  Location: [facility] ORS;  Service: Gynecology;  Laterality: N/A;   DILATION AND CURETTAGE OF UTERUS     ENDOMETRIAL ABLATION N/A 01/19/2021   Procedure: ENDOMETRIAL ABLATION, MINERVA;  Surgeon: Hildred Laser, MD;  Location: [facility] ORS;  Service: Gynecology;  Laterality: N/A;   ENDOMETRIAL BIOPSY     benign   ESOPHAGOGASTRODUODENOSCOPY (EGD) WITH PROPOFOL N/A 08/24/2022   Procedure: ESOPHAGOGASTRODUODENOSCOPY (EGD) WITH PROPOFOL;  Surgeon: Toney Reil, MD;  Location: [facility] ENDOSCOPY;  Service: Gastroenterology;  Laterality: N/A;   EXPLORATORY LAPAROTOMY  1992   REMOVAL OF RUPTURED ECTOPIC   HAND SURGERY Right 1998   cyst removed   HERNIA REPAIR  1975   UMBILICAL   JOINT REPLACEMENT Right 2014   TKR   KNEE ARTHROSCOPY Right 2012   KNEE SURGERY Right 2014   total knee replacement   ROBOTIC ASSISTED LAPAROSCOPIC HYSTERECTOMY AND SALPINGECTOMY Bilateral 03/15/2022   Procedure: XI ROBOTIC ASSISTED LAPAROSCOPIC HYSTERECTOMY;  Surgeon: Hildred Laser, MD;  Location: [facility] ORS;  Service: Gynecology;  Laterality:  Bilateral;   SHOULDER ARTHROSCOPY WITH BICEPSTENOTOMY Left 12/14/2016   Procedure: SHOULDER ARTHROSCOPY WITH BICEPSTENOTOMY;  Surgeon: Christena Flake, MD;  Location: [facility];  Service: Orthopedics;  Laterality: Left;   SHOULDER ARTHROSCOPY WITH OPEN ROTATOR CUFF REPAIR Left 12/14/2016   Procedure: SHOULDER ARTHROSCOPY WITH OPEN ROTATOR CUFF REPAIR AND ARTHROSCOPIC ROTATOR CUFF REPAIR;  Surgeon: Christena Flake, MD;  Location: [facility];  Service: Orthopedics;  Laterality: Left;   SHOULDER ARTHROSCOPY WITH ROTATOR CUFF  REPAIR Right 01/04/2019   Procedure: SHOULDER ARTHROSCOPY WITH ROTATOR CUFF REPAIR;  Surgeon: Christena Flake, MD;  Location: [facility] OR[facility];  Service: Orthopedics;  Laterality: Right;   SHOULDER ARTHROSCOPY WITH SUBACROMIAL DECOMPRESSION Left 12/14/2016   Procedure: SHOULDER ARTHROSCOPY WITH SUBACROMIAL DECOMPRESSION;  Surgeon: Christena Flake, MD;  Location: [facility];  Service: Orthopedics;  Laterality: Left;   SHOULDER ARTHROSCOPY WITH SUBACROMIAL DECOMPRESSION AND BICEP TENDON REPAIR Right 01/04/2019   Procedure: SHOULDER ARTHROSCOPY WITH DEBRIDEMENT AND SUBACROMIAL DECOMPRESSION-RIGHT;  Surgeon: Christena Flake, MD;  Location: [facility];  Service: Orthopedics;  Laterality: Right;   TOTAL KNEE ARTHROPLASTY Left 04/26/2023   Procedure: TOTAL KNEE ARTHROPLASTY;  Surgeon: Christena Flake, MD;  Location: [facility];  Service: Orthopedics;  Laterality: Left;   TUBAL LIGATION      Family History  Problem Relation Age of Onset   Hypertension Mother    Thyroid disease Mother    Lupus Mother    Congestive Heart Failure Mother    Hypertension Brother    CAD Maternal Grandmother    Breast cancer Neg Hx    Ovarian cancer Neg Hx    Colon cancer Neg Hx     Social History   Socioeconomic History   Marital status: Married    Spouse name: brian   Number of children: 7   Years of education: Not on file   Highest education level: 11th grade  Occupational History   Occupation: disabled    Comment: disabled  Tobacco Use   Smoking status: Former    Current packs/day: 0.00    Average packs/day: 2.0 packs/day for 25.0 years (50.0 ttl pk-yrs)    Types: Cigarettes    Start date: 06/09/1992    Quit date: 06/09/2017    Years since quitting: 6.3   Smokeless tobacco: Never  Vaping Use   Vaping status: Never Used  Substance and Sexual Activity   Alcohol use: No   Drug use: Yes    Frequency: 7.0 times per week    Types: Marijuana    Comment: pt states as needed when she is hurting real bad   Sexual activity: Yes     Partners: Male    Birth control/protection: None  Other Topics Concern   Not on file  Social History Narrative   Have 6 biological childrean and custody of niece since 32 months.   Social Determinants of Health   Financial Resource Strain: Low Risk  (02/16/2023)   Overall Financial Resource Strain (CARDIA)    Difficulty of Paying Living Expenses: Not very hard  Food Insecurity: Food Insecurity Present (04/26/2023)   Hunger Vital Sign    Worried About Running Out of Food in the Last Year: Never true    Ran Out of Food in the Last Year: Sometimes true  Transportation Needs: No Transportation Needs (04/26/2023)   PRAPARE - Administrator, Civil Service (Medical): No    Lack of Transportation (Non-Medical): No  Physical Activity: Unknown (02/16/2023)   Exercise Vital Sign  Days of Exercise per Week: Patient declined    Minutes of Exercise per Session: Not on file  Stress: No Stress Concern Present (02/16/2023)   [employer] of Occupational Health - Occupational Stress Questionnaire    Feeling of Stress : Only a little  Social Connections: Moderately Isolated (02/16/2023)   Social Connection and Isolation Panel [NHANES]    Frequency of Communication with Friends and Family: Once a week    Frequency of Social Gatherings with Friends and Family: Never    Attends Religious Services: More than 4 times per year    Active Member of [employer] or Organizations: No    Attends Engineer, structural: Not on file    Marital Status: Married  Catering manager Violence: Not At Risk (04/26/2023)   Humiliation, Afraid, Rape, and Kick questionnaire    Fear of Current or Ex-Partner: No    Emotionally Abused: No    Physically Abused: No    Sexually Abused: No    Outpatient Medications Prior to Visit  Medication Sig Dispense Refill   albuterol (VENTOLIN HFA) 108 (90 Base) MCG/ACT inhaler Inhale 2 puffs into the lungs every 6 (six) hours as needed for wheezing or shortness of  breath. 18 g 3   amoxicillin-clavulanate (AUGMENTIN) 875-125 MG tablet Take 1 tablet by mouth 2 (two) times daily. 20 tablet 0   apixaban (ELIQUIS) 2.5 MG TABS tablet Take 1 tablet (2.5 mg total) by mouth 2 (two) times daily. 30 tablet 0   atorvastatin (LIPITOR) 40 MG tablet Take 1 tablet (40 mg total) by mouth daily. 90 tablet 3   benzonatate (TESSALON) 100 MG capsule Take 2 capsules (200 mg total) by mouth 2 (two) times daily as needed for cough. 20 capsule 0   chlorhexidine (HIBICLENS) 4 % external liquid Apply topically daily as needed. 437 mL 2   cyclobenzaprine (FLEXERIL) 5 MG tablet Take 1 tablet (5 mg total) by mouth at bedtime as needed for muscle spasms. 30 tablet 3   gabapentin (NEURONTIN) 100 MG capsule Take 100 mg by mouth 3 (three) times daily.     Glucagon (GVOKE HYPOPEN 1-PACK) 1 MG/0.2ML SOAJ Inject 1 mg into the skin as needed (hypogylcemia). 0.2 mL 2   glucose blood (ACCU-CHEK GUIDE) test strip Test blood sugar tid, Dx E11.65 100 each 12   promethazine-dextromethorphan (PROMETHAZINE-DM) 6.25-15 MG/5ML syrup Take 5 mLs by mouth 4 (four) times daily as needed for cough. 118 mL 0   Semaglutide, 1 MG/DOSE, 4 MG/3ML SOPN Inject 1 mg as directed once a week. 3 mL 2   tiZANidine (ZANAFLEX) 4 MG tablet Take 4 mg by mouth 3 (three) times daily.     No facility-administered medications prior to visit.    Allergies  Allergen Reactions   Dilaudid [Hydromorphone Hcl] Nausea And Vomiting   Cymbalta [Duloxetine Hcl] Nausea And Vomiting   Morphine And Codeine Nausea And Vomiting   Tramadol Nausea Only    If she takes antinausea medicine with this medicine, then she can tolerate it.    ROS Review of Systems  All other systems reviewed and are negative.     Objective:    Physical Exam Constitutional:      Appearance: Normal appearance. She is obese.     Comments: Walking with walker  HENT:     Head: Normocephalic and atraumatic.  Eyes:     Conjunctiva/sclera: Conjunctivae  normal.  Cardiovascular:     Rate and Rhythm: Normal rate and regular rhythm.  Pulmonary:  Effort: Pulmonary effort is normal.     Breath sounds: Normal breath sounds.  Skin:    General: Skin is warm and dry.     Comments: Quarter sized abscess on arm with drainage  Neurological:     General: No focal deficit present.     Mental Status: She is alert. Mental status is at baseline.  Psychiatric:        Mood and Affect: Mood normal.        Behavior: Behavior normal.     LMP 02/22/2022 (Approximate)  Wt Readings from Last 3 Encounters:  10/05/23 (!) 310 lb (140.6 kg)  08/29/23 (!) 311 lb 9.6 oz (141.3 kg)  07/27/23 (!) 312 lb 4.8 oz (141.7 kg)     Health Maintenance Due  Topic Date Due   Zoster Vaccines- Shingrix (1 of 2) Never done   Lung Cancer Screening  01/29/2023   OPHTHALMOLOGY EXAM  03/05/2023   INFLUENZA VACCINE  06/23/2023   COVID-19 Vaccine (6 - 2023-24 season) 07/24/2023   FOOT EXAM  07/28/2023   Diabetic kidney evaluation - Urine ACR  10/27/2023    There are no preventive care reminders to display for this patient.  Lab Results  Component Value Date   TSH 1.614 03/15/2022   Lab Results  Component Value Date   WBC 5.9 04/21/2023   HGB 13.5 04/21/2023   HCT 42.3 04/21/2023   MCV 93.2 04/21/2023   PLT 238 04/21/2023   Lab Results  Component Value Date   NA 140 04/21/2023   K 3.8 04/21/2023   CO2 28 04/21/2023   GLUCOSE 124 (H) 04/21/2023   BUN 14 04/21/2023   CREATININE 1.10 (H) 04/21/2023   BILITOT 0.3 04/21/2023   ALKPHOS 74 04/21/2023   AST 19 04/21/2023   ALT 18 04/21/2023   PROT 7.6 04/21/2023   ALBUMIN 3.8 04/21/2023   CALCIUM 8.8 (L) 04/21/2023   ANIONGAP 9 04/21/2023   EGFR 79 01/18/2022   Lab Results  Component Value Date   CHOL 185 12/14/2021   Lab Results  Component Value Date   HDL 41 (L) 12/14/2021   Lab Results  Component Value Date   LDLCALC 122 (H) 12/14/2021   Lab Results  Component Value Date   TRIG 109  12/14/2021   Lab Results  Component Value Date   CHOLHDL 4.5 12/14/2021   Lab Results  Component Value Date   HGBA1C 7.5 (A) 07/27/2023      Assessment & Plan:   1. Type 2 diabetes mellitus with hyperglycemia, with long-term current use of insulin (HCC): A1c 7.5% today, insulin discontinued. Is currently on Ozempic 0.5 mg, will plan to increase to 1 mg next week. Follow up in 3 months to recheck A1c.  - POCT HgB A1C  2. Muscle spasms of lower extremity: Restart muscle relaxer at night to help with spasms.  - cyclobenzaprine (FLEXERIL) 5 MG tablet; Take 1 tablet (5 mg total) by mouth at bedtime as needed for muscle spasms.  Dispense: 30 tablet; Refill: 3  3. Arm abscess: Will send for culture, discussed keeping it clean and using warm compresses to help express drainage.   - Wound culture   Follow-up: No follow-ups on file.    Margarita Mail, DO

## 2023-10-26 ENCOUNTER — Ambulatory Visit: Payer: MEDICAID | Admitting: Internal Medicine

## 2023-12-03 ENCOUNTER — Other Ambulatory Visit: Payer: Self-pay | Admitting: Internal Medicine

## 2023-12-03 DIAGNOSIS — E1165 Type 2 diabetes mellitus with hyperglycemia: Secondary | ICD-10-CM

## 2023-12-06 NOTE — Telephone Encounter (Signed)
 Was due for f/u

## 2023-12-06 NOTE — Telephone Encounter (Signed)
 Requested medication (s) are due for refill today:   Yes  Requested medication (s) are on the active medication list:   Yes  Future visit scheduled:   No.    Was a No Show for 10/26/2023.     LOV 07/27/2023   Last ordered: 08/25/2023 3 ml, 2 refills  Labs are in date,criteria met but was a No Show for follow up appt on 10/26/2023.  Provider to review for refills.   Requested Prescriptions  Pending Prescriptions Disp Refills   OZEMPIC , 1 MG/DOSE, 4 MG/3ML SOPN [Pharmacy Med Name: OZEMPIC  1MG  PER DOSE (4MG /3ML) PFP] 3 mL 2    Sig: INJECT 1 MG UNDER THE SKIN ONCE WEEKLY     Endocrinology:  Diabetes - GLP-1 Receptor Agonists - semaglutide  Failed - 12/06/2023 10:01 AM      Failed - HBA1C in normal range and within 180 days    Hemoglobin A1C  Date Value Ref Range Status  07/27/2023 7.5 (A) 4.0 - 5.6 % Final  12/27/2012 10.1 (H) 4.2 - 6.3 % Final    Comment:    The American Diabetes Association recommends that a primary goal of therapy should be <7% and that physicians should reevaluate the treatment regimen in patients with HbA1c values consistently >8%.    Hgb A1c MFr Bld  Date Value Ref Range Status  03/15/2022 7.8 (H) 4.8 - 5.6 % Final    Comment:    (NOTE) Pre diabetes:          5.7%-6.4%  Diabetes:              >6.4%  Glycemic control for   <7.0% adults with diabetes          Failed - Cr in normal range and within 360 days    Creat  Date Value Ref Range Status  04/25/2020 1.00 0.50 - 1.05 mg/dL Final    Comment:    For patients >80 years of age, the reference limit for Creatinine is approximately 13% higher for people identified as African-American. .    Creatinine, Ser  Date Value Ref Range Status  04/21/2023 1.10 (H) 0.44 - 1.00 mg/dL Final   Creatinine, Urine  Date Value Ref Range Status  10/26/2022 36 20 - 275 mg/dL Final         Passed - Valid encounter within last 6 months    Recent Outpatient Visits           3 months ago Sore throat   [facility]  [facility] Gareth Clarity F, FNP   4 months ago Type 2 diabetes mellitus with hyperglycemia, with long-term current use of insulin  [facility])   [facility] Bernardo Fend, DO   9 months ago Type 2 diabetes mellitus with hyperglycemia, with long-term current use of insulin  [facility])   [facility] Bernardo Fend, DO   10 months ago Type 2 diabetes mellitus with hyperglycemia, with long-term current use of insulin  [facility])   [facility] Bernardo Fend, DO   11 months ago Abscess   [facility] Gareth Clarity FALCON, [place]

## 2023-12-16 ENCOUNTER — Other Ambulatory Visit: Payer: Self-pay | Admitting: Nurse Practitioner

## 2023-12-16 ENCOUNTER — Encounter: Payer: Self-pay | Admitting: Internal Medicine

## 2023-12-16 DIAGNOSIS — J029 Acute pharyngitis, unspecified: Secondary | ICD-10-CM

## 2023-12-16 DIAGNOSIS — J069 Acute upper respiratory infection, unspecified: Secondary | ICD-10-CM

## 2023-12-20 ENCOUNTER — Encounter: Payer: Self-pay | Admitting: Obstetrics and Gynecology

## 2023-12-20 DIAGNOSIS — Z1231 Encounter for screening mammogram for malignant neoplasm of breast: Secondary | ICD-10-CM

## 2023-12-20 NOTE — Addendum Note (Signed)
Encounter addended by: Silvana Newness on: 12/20/2023 4:25 PM  Actions taken: Imaging Exam ended

## 2023-12-20 NOTE — Addendum Note (Signed)
Encounter addended by: Silvana Newness on: 12/20/2023 4:24 PM  Actions taken: Imaging Exam ended

## 2023-12-26 ENCOUNTER — Ambulatory Visit: Payer: MEDICAID | Admitting: Internal Medicine

## 2024-01-03 NOTE — Progress Notes (Unsigned)
    GYNECOLOGY PROGRESS NOTE  Subjective:    Patient ID: Brenda Hicks, female    DOB: 17-Oct-1966, 58 y.o.   MRN: [id number]  HPI  Patient is a 58 y.o. [id number] female who presents for evaluation of right breast tenderness and soreness.   {Common ambulatory SmartLinks:19316}  Review of Systems {ros; complete:30496}   Objective:   Last menstrual period 02/22/2022. There is no height or weight on file to calculate BMI. General appearance: {general exam:16600} Abdomen: {abdominal exam:16834} Pelvic: {pelvic exam:16852::"cervix normal in appearance","external genitalia normal","no adnexal masses or tenderness","no cervical motion tenderness","rectovaginal septum normal","uterus normal size, shape, and consistency","vagina normal without discharge"} Extremities: {extremity exam:5109} Neurologic: {neuro exam:17854}   Assessment:   No diagnosis found.   Plan:   There are no diagnoses linked to this encounter.    Hildred Laser, MD [id number] [place] /*-+*/

## 2024-01-04 ENCOUNTER — Encounter: Payer: Self-pay | Admitting: Obstetrics and Gynecology

## 2024-01-04 ENCOUNTER — Ambulatory Visit (INDEPENDENT_AMBULATORY_CARE_PROVIDER_SITE_OTHER): Payer: MEDICAID | Admitting: Obstetrics and Gynecology

## 2024-01-04 VITALS — BP 165/87 | HR 68 | Resp 16 | Ht 66.0 in | Wt 315.0 lb

## 2024-01-04 DIAGNOSIS — I10 Essential (primary) hypertension: Secondary | ICD-10-CM

## 2024-01-04 DIAGNOSIS — Z1231 Encounter for screening mammogram for malignant neoplasm of breast: Secondary | ICD-10-CM

## 2024-01-04 DIAGNOSIS — N644 Mastodynia: Secondary | ICD-10-CM | POA: Diagnosis not present

## 2024-01-04 DIAGNOSIS — Z794 Long term (current) use of insulin: Secondary | ICD-10-CM

## 2024-01-04 DIAGNOSIS — E1142 Type 2 diabetes mellitus with diabetic polyneuropathy: Secondary | ICD-10-CM

## 2024-01-04 DIAGNOSIS — R200 Anesthesia of skin: Secondary | ICD-10-CM

## 2024-01-04 DIAGNOSIS — E1165 Type 2 diabetes mellitus with hyperglycemia: Secondary | ICD-10-CM

## 2024-01-04 MED ORDER — GVOKE HYPOPEN 1-PACK 1 MG/0.2ML ~~LOC~~ SOAJ: 1.0000 mg | SUBCUTANEOUS | Status: AC | PRN

## 2024-01-10 ENCOUNTER — Other Ambulatory Visit: Payer: Self-pay | Admitting: Obstetrics and Gynecology

## 2024-01-10 DIAGNOSIS — N644 Mastodynia: Secondary | ICD-10-CM

## 2024-01-12 ENCOUNTER — Emergency Department
Admission: EM | Admit: 2024-01-12 | Discharge: 2024-01-12 | Disposition: A | Discharge status: Home / Self Care | Payer: MEDICAID | Attending: Emergency Medicine | Admitting: Emergency Medicine

## 2024-01-12 ENCOUNTER — Emergency Department: Payer: MEDICAID

## 2024-01-12 ENCOUNTER — Encounter: Payer: Self-pay | Admitting: Emergency Medicine

## 2024-01-12 ENCOUNTER — Other Ambulatory Visit: Payer: Self-pay

## 2024-01-12 DIAGNOSIS — E119 Type 2 diabetes mellitus without complications: Secondary | ICD-10-CM | POA: Insufficient documentation

## 2024-01-12 DIAGNOSIS — I10 Essential (primary) hypertension: Secondary | ICD-10-CM | POA: Diagnosis not present

## 2024-01-12 DIAGNOSIS — M25562 Pain in left knee: Secondary | ICD-10-CM | POA: Diagnosis present

## 2024-01-12 DIAGNOSIS — J40 Bronchitis, not specified as acute or chronic: Secondary | ICD-10-CM | POA: Insufficient documentation

## 2024-01-12 LAB — TROPONIN I (HIGH SENSITIVITY): Troponin I (High Sensitivity): 4 ng/L (ref <18)

## 2024-01-12 LAB — BASIC METABOLIC PANEL
Anion gap: 10 (ref 5–15)
BUN: 19 mg/dL (ref 6–20)
CO2: 24 mmol/L (ref 22–32)
Calcium: 8.9 mg/dL (ref 8.9–10.3)
Chloride: 105 mmol/L (ref 98–111)
Creatinine, Ser: 0.95 mg/dL (ref 0.44–1.00)
GFR, Estimated: >60 mL/min (ref >60)
Glucose, Bld: 210 mg/dL — ABNORMAL HIGH (ref 70–99)
Potassium: 3.4 mmol/L — ABNORMAL LOW (ref 3.5–5.1)
Sodium: 139 mmol/L (ref 135–145)

## 2024-01-12 LAB — CBC
HCT: 41.9 % (ref 36.0–46.0)
Hemoglobin: 13.3 g/dL (ref 12.0–15.0)
MCH: 30.1 pg (ref 26.0–34.0)
MCHC: 31.7 g/dL (ref 30.0–36.0)
MCV: 94.8 fL (ref 80.0–100.0)
Platelets: 210 10*3/uL (ref 150–400)
RBC: 4.42 MIL/uL (ref 3.87–5.11)
RDW: 13.4 % (ref 11.5–15.5)
WBC: 3.8 10*3/uL — ABNORMAL LOW (ref 4.0–10.5)
nRBC: 0 % (ref 0.0–0.2)

## 2024-01-12 LAB — D-DIMER, QUANTITATIVE: D-Dimer, Quant: 1.38 ug{FEU}/mL — ABNORMAL HIGH (ref 0.00–0.50)

## 2024-01-12 MED ORDER — HYDROCODONE-ACETAMINOPHEN 5-325 MG PO TABS: 1.0000 | ORAL_TABLET | Freq: Three times a day (TID) | ORAL | 0 refills | Status: AC | PRN

## 2024-01-12 MED ORDER — PREDNISONE 20 MG PO TABS: 40.0000 mg | ORAL_TABLET | Freq: Every day | ORAL | 0 refills | Status: AC

## 2024-01-12 MED ORDER — FENTANYL CITRATE PF 50 MCG/ML IJ SOSY
50.0000 ug | PREFILLED_SYRINGE | Freq: Once | INTRAMUSCULAR | Status: AC
  Administered 2024-01-12: 50 ug via INTRAVENOUS
  Filled 2024-01-12: qty 1

## 2024-01-12 MED ORDER — IPRATROPIUM-ALBUTEROL 0.5-2.5 (3) MG/3ML IN SOLN
3.0000 mL | Freq: Once | RESPIRATORY_TRACT | Status: AC
  Administered 2024-01-12: 3 mL via RESPIRATORY_TRACT
  Filled 2024-01-12: qty 3

## 2024-01-12 MED ORDER — TECHNETIUM TO 99M ALBUMIN AGGREGATED
4.0000 | Freq: Once | INTRAVENOUS | Status: AC | PRN
  Administered 2024-01-12: 3.75 via INTRAVENOUS

## 2024-01-12 MED ORDER — METHYLPREDNISOLONE SODIUM SUCC 125 MG IJ SOLR
125.0000 mg | Freq: Once | INTRAMUSCULAR | Status: AC
  Administered 2024-01-12: 125 mg via INTRAVENOUS
  Filled 2024-01-12: qty 2

## 2024-01-12 MED ORDER — KETOROLAC TROMETHAMINE 30 MG/ML IJ SOLN
30.0000 mg | Freq: Once | INTRAMUSCULAR | Status: AC
  Administered 2024-01-12: 30 mg via INTRAMUSCULAR
  Filled 2024-01-12: qty 1

## 2024-01-12 NOTE — Discharge Instructions (Signed)
Your exam, labs, X-ray, and V-Q scan did not show any signs of a blood clot in the lungs. Take the prescription meds as directed. Follow-up with your Primary provider or Ortho specialist as discussed.

## 2024-01-12 NOTE — ED Triage Notes (Addendum)
Pt arrived via POV with reports of L leg pain and swelling, pt states she has had problems since her knee replacement, difficulty bearing weight without pain. Denies any injury, states she has bruising also to L leg.

## 2024-01-12 NOTE — ED Notes (Signed)
See triage note  Presents with pain to left leg  States pain is mainly posterior left knee  States pain has been there since her knee surgery in June  Denies any new injury

## 2024-01-12 NOTE — ED Notes (Signed)
Pt is up to bathroom  States she is having some chest tightness/pressure  And increased SOB with ambulation

## 2024-01-12 NOTE — ED Provider Notes (Signed)
 [facility] Provider Note    Event Date/Time   First MD Initiated Contact with Patient 01/12/24 [phone]     (approximate)   History   Leg Pain and Leg Swelling   HPI  Brenda Hicks is a 58 y.o. female with history of type 2 diabetes, hypertension, polyneuropathy, ischemic stroke, asthma and as listed in EMR presents to the emergency department for treatment and evaluation of pain in the left lower extremity. Symptoms started after total knee replacement in June 2024. Pain is behind the right knee and "shoots" down the leg into her foot. Knee and right calf swell off an on and today calf hurts and seems swollen. Patient denies new injury or new symptoms since last follow up with orthopedics.   Physical Exam   Triage Vital Signs: ED Triage Vitals  Encounter Vitals Group     BP 01/12/24 0643 (!) 157/97     Systolic BP Percentile --      Diastolic BP Percentile --      Pulse Rate 01/12/24 0643 87     Resp 01/12/24 0643 18     Temp 01/12/24 0643 97.8 F (36.6 C)     Temp src --      SpO2 01/12/24 0643 96 %     Weight 01/12/24 0644 297 lb (134.7 kg)     Height 01/12/24 0644 5\' 6"  (1.676 m)     Head Circumference --      Peak Flow --      Pain Score 01/12/24 0647 10     Pain Loc --      Pain Education --      Exclude from Growth Chart --     Most recent vital signs: Vitals:   01/12/24 1458 01/12/24 1713  BP: (!) 148/78 (!) 140/80  Pulse: 78 80  Resp: 18 18  Temp: 98 F (36.7 C) 98.1 F (36.7 C)  SpO2: 98% 98%    General: Awake, no distress.  CV:  Good peripheral perfusion.  Resp:  Normal effort.  Abd:  No distention.  Other:  Left knee: demonstrates active flexion and extension, both somewhat limited by body habitus and report of pain. Patella tracks midline. Tenderness in the popliteal space and lateral aspect on palpation.  Sensation is present to the left lower extremity and foot.  Pulses 2+.  Capillary refills less than 3.  Patient  demonstrates dorsiflexion, plantarflexion, inversion, and eversion of the foot without significant increase in pain. Antalgic gait.   ED Results / Procedures / Treatments   Labs (all labs ordered are listed, but only abnormal results are displayed) Labs Reviewed  BASIC METABOLIC PANEL - Abnormal; Notable for the following components:      Result Value   Potassium 3.4 (*)    Glucose, Bld 210 (*)    All other components within normal limits  CBC - Abnormal; Notable for the following components:   WBC 3.8 (*)    All other components within normal limits  D-DIMER, QUANTITATIVE - Abnormal; Notable for the following components:   D-Dimer, Quant 1.38 (*)    All other components within normal limits  TROPONIN I (HIGH SENSITIVITY)     EKG  NSR rate of 78.   RADIOLOGY  Image and radiology report reviewed and interpreted by me. Radiology report consistent with the same.  VQ scan pending.   PROCEDURES:  Critical Care performed: No  Procedures   MEDICATIONS ORDERED IN ED:  Medications  ipratropium-albuterol (DUONEB)  0.5-2.5 (3) MG/3ML nebulizer solution 3 mL (3 mLs Nebulization Given 01/12/24 1036)  ketorolac (TORADOL) 30 MG/ML injection 30 mg (30 mg Intramuscular Given 01/12/24 1036)  methylPREDNISolone sodium succinate (SOLU-MEDROL) 125 mg/2 mL injection 125 mg (125 mg Intravenous Given 01/12/24 1256)  fentaNYL (SUBLIMAZE) injection 50 mcg (50 mcg Intravenous Given 01/12/24 1334)  technetium albumin aggregated (MAA) injection solution 4 millicurie (3.75 millicuries Intravenous Contrast Given 01/12/24 1505)     IMPRESSION / MDM / ASSESSMENT AND PLAN / ED COURSE   I have reviewed the triage note.  Differential diagnosis includes, but is not limited to, DVT, joint effusion, Baker's cyst  Patient's presentation is most consistent with acute presentation with potential threat to life or bodily function.  Last follow-up visit with orthopedics note from Dr. Joice Lofts reviewed. Plan  was to have her follow-up with Dr. Ernestine Mcmurray in neurosurgery due to potential irritation of the peroneal nerve and have her continue her current medication regimen.   Patient states that she saw Dr. Katrinka Blazing and was referred back to Dr. Joice Lofts before scheduling another appointment. That appointment was supposed to be yesterday, but was cancelled due to weather.   ----------------------------------------- 10:56 AM on 01/12/2024 ----------------------------------------- While discussing results of [place], I noticed patient seemed short of breath and audible wheezing that was not present initially. She does have a history of asthma and uses an inhaler, but hasn't had to recently. DuoNeb given without much improvement and she now tells me she feels "heaviness" on her chest. Troponin and D-dimer ordered in addition to repeat EKG and chest x-ray.  ----------------------------------------- 2:27 PM on 01/12/2024 ----------------------------------------- Wheezing and shortness of breath improved after DuoNeb. Patient was a difficult lab and IV stick, so additional testing delayed. D-dimer elevated. IV team unable to get 20 guage. CT team unable to do CTA without it. VQ scan ordered.   ----------------------------------------- 2:59 PM on 01/12/2024 -----------------------------------------  IV team unable to establish 20-gauge.  V/Q scan available and ordered.  Patient care relinquished to [facility], PA-C who will follow-up on results determine disposition.     FINAL CLINICAL IMPRESSION(S) / ED DIAGNOSES   Final diagnoses:  Acute pain of left knee  Bronchitis     Rx / DC Orders   ED Discharge Orders          Ordered    predniSONE (DELTASONE) 20 MG tablet  Daily with breakfast        01/12/24 1700    HYDROcodone-acetaminophen (NORCO/VICODIN) 5-325 MG tablet  3 times daily PRN        01/12/24 1700             Note:  This document was prepared using Dragon voice recognition software  and may include unintentional dictation errors.   Chinita Pester, FNP 01/13/24 1533    Phineas Semen, MD 01/17/24 [phone]

## 2024-01-16 NOTE — Telephone Encounter (Unsigned)
 Copied from CRM [id number]. Topic: General - Other >> Jan 16, 2024  8:43 AM Franchot Heidelberg wrote: Reason for CRM: Pt states she just missed a call from PCP's nurse. Please advise

## 2024-02-03 ENCOUNTER — Ambulatory Visit
Admission: RE | Admit: 2024-02-03 | Discharge: 2024-02-03 | Disposition: A | Discharge status: Home / Self Care | Payer: MEDICAID | Source: Ambulatory Visit | Attending: Obstetrics and Gynecology | Admitting: Obstetrics and Gynecology

## 2024-02-03 DIAGNOSIS — N644 Mastodynia: Secondary | ICD-10-CM

## 2024-02-04 ENCOUNTER — Other Ambulatory Visit: Payer: Self-pay

## 2024-02-04 ENCOUNTER — Emergency Department
Admission: EM | Admit: 2024-02-04 | Discharge: 2024-02-04 | Disposition: A | Discharge status: Home / Self Care | Payer: MEDICAID | Attending: Emergency Medicine | Admitting: Emergency Medicine

## 2024-02-04 ENCOUNTER — Emergency Department: Payer: MEDICAID

## 2024-02-04 DIAGNOSIS — I10 Essential (primary) hypertension: Secondary | ICD-10-CM | POA: Diagnosis not present

## 2024-02-04 DIAGNOSIS — J209 Acute bronchitis, unspecified: Secondary | ICD-10-CM | POA: Insufficient documentation

## 2024-02-04 DIAGNOSIS — Z8673 Personal history of transient ischemic attack (TIA), and cerebral infarction without residual deficits: Secondary | ICD-10-CM | POA: Diagnosis not present

## 2024-02-04 DIAGNOSIS — Z96659 Presence of unspecified artificial knee joint: Secondary | ICD-10-CM | POA: Insufficient documentation

## 2024-02-04 DIAGNOSIS — E119 Type 2 diabetes mellitus without complications: Secondary | ICD-10-CM | POA: Insufficient documentation

## 2024-02-04 DIAGNOSIS — R0602 Shortness of breath: Secondary | ICD-10-CM | POA: Diagnosis present

## 2024-02-04 DIAGNOSIS — J45909 Unspecified asthma, uncomplicated: Secondary | ICD-10-CM | POA: Insufficient documentation

## 2024-02-04 LAB — CBC
HCT: 44.9 % (ref 36.0–46.0)
Hemoglobin: 14.4 g/dL (ref 12.0–15.0)
MCH: 30.4 pg (ref 26.0–34.0)
MCHC: 32.1 g/dL (ref 30.0–36.0)
MCV: 94.7 fL (ref 80.0–100.0)
Platelets: 264 10*3/uL (ref 150–400)
RBC: 4.74 MIL/uL (ref 3.87–5.11)
RDW: 13.2 % (ref 11.5–15.5)
WBC: 5.9 10*3/uL (ref 4.0–10.5)
nRBC: 0 % (ref 0.0–0.2)

## 2024-02-04 LAB — BASIC METABOLIC PANEL
Anion gap: 7 (ref 5–15)
BUN: 16 mg/dL (ref 6–20)
CO2: 27 mmol/L (ref 22–32)
Calcium: 8.8 mg/dL — ABNORMAL LOW (ref 8.9–10.3)
Chloride: 100 mmol/L (ref 98–111)
Creatinine, Ser: 1.04 mg/dL — ABNORMAL HIGH (ref 0.44–1.00)
GFR, Estimated: >60 mL/min (ref >60)
Glucose, Bld: 224 mg/dL — ABNORMAL HIGH (ref 70–99)
Potassium: 4.2 mmol/L (ref 3.5–5.1)
Sodium: 134 mmol/L — ABNORMAL LOW (ref 135–145)

## 2024-02-04 LAB — TROPONIN I (HIGH SENSITIVITY): Troponin I (High Sensitivity): 3 ng/L (ref <18)

## 2024-02-04 LAB — BRAIN NATRIURETIC PEPTIDE: B Natriuretic Peptide: 10.5 pg/mL (ref 0.0–100.0)

## 2024-02-04 MED ORDER — ALBUTEROL SULFATE (2.5 MG/3ML) 0.083% IN NEBU: 2.5000 mg | INHALATION_SOLUTION | Freq: Four times a day (QID) | RESPIRATORY_TRACT | 1 refills | Status: AC | PRN

## 2024-02-04 MED ORDER — IPRATROPIUM-ALBUTEROL 0.5-2.5 (3) MG/3ML IN SOLN
3.0000 mL | Freq: Once | RESPIRATORY_TRACT | Status: AC
  Administered 2024-02-04: 3 mL via RESPIRATORY_TRACT
  Filled 2024-02-04: qty 3

## 2024-02-04 MED ORDER — AZITHROMYCIN 250 MG PO TABS: ORAL_TABLET | ORAL | 0 refills | Status: AC

## 2024-02-04 MED ORDER — METHYLPREDNISOLONE SODIUM SUCC 125 MG IJ SOLR
125.0000 mg | Freq: Once | INTRAMUSCULAR | Status: AC
Start: 2024-02-04 — End: 2024-02-04
  Administered 2024-02-04: 125 mg via INTRAVENOUS
  Filled 2024-02-04: qty 2

## 2024-02-04 MED ORDER — ALBUTEROL SULFATE HFA 108 (90 BASE) MCG/ACT IN AERS: 2.0000 | INHALATION_SPRAY | RESPIRATORY_TRACT | 0 refills | Status: DC | PRN

## 2024-02-04 MED ORDER — PREDNISONE 50 MG PO TABS: 50.0000 mg | ORAL_TABLET | Freq: Every day | ORAL | 0 refills | Status: AC

## 2024-02-04 NOTE — ED Provider Notes (Signed)
 [facility] Provider Note    Event Date/Time   First MD Initiated Contact with Patient 02/04/24 1108     (approximate)   History   Shortness of Breath   HPI  Brenda Hicks is a 58 y.o. female with history of hypertension, asthma, type 2 diabetes, CVA, and polyneuropathy who presents with cough with yellow sputum, associated with shortness of breath and orthopnea for the last 2 days.  She denies chest pain.  She has no significant leg swelling.  She denies any fever.  She was told previously that she has bronchitis.  I reviewed the past medical records.  The patient was most recently seen in the ED on 2/24 extremity pain.  She was admitted to the orthopedics service in June of last year for a total knee replacement.   Physical Exam   Triage Vital Signs: ED Triage Vitals  Encounter Vitals Group     BP 02/04/24 1107 (!) 153/98     Systolic BP Percentile --      Diastolic BP Percentile --      Pulse Rate 02/04/24 1104 89     Resp 02/04/24 1104 (!) 24     Temp 02/04/24 1104 98.3 F (36.8 C)     Temp Source 02/04/24 1104 Oral     SpO2 02/04/24 1101 99 %     Weight 02/04/24 1107 (!) 312 lb (141.5 kg)     Height 02/04/24 1107 5\' 6"  (1.676 m)     Head Circumference --      Peak Flow --      Pain Score 02/04/24 1101 9     Pain Loc --      Pain Education --      Exclude from Growth Chart --     Most recent vital signs: Vitals:   02/04/24 1146 02/04/24 1200  BP:  (!) 159/82  Pulse:  88  Resp:  20  Temp:    SpO2: 100% 100%    General: Alert, no distress.  CV:  Good peripheral perfusion.  Resp:  Normal effort.  Diffuse wheezing and coarse breath sounds bilaterally. Abd:  No distention.  Other:  No peripheral edema.   ED Results / Procedures / Treatments   Labs (all labs ordered are listed, but only abnormal results are displayed) Labs Reviewed  BASIC METABOLIC PANEL - Abnormal; Notable for the following components:      Result Value    Sodium 134 (*)    Glucose, Bld 224 (*)    Creatinine, Ser 1.04 (*)    Calcium 8.8 (*)    All other components within normal limits  CBC  BRAIN NATRIURETIC PEPTIDE  TROPONIN I (HIGH SENSITIVITY)     EKG  ED ECG REPORT I, Dionne Bucy, the attending physician, personally viewed and interpreted this ECG.  Date: 02/04/2024 EKG Time: 1105 Rate: 88 Rhythm: normal sinus rhythm QRS Axis: normal Intervals: normal ST/T Wave abnormalities: normal Narrative Interpretation: no evidence of acute ischemia    RADIOLOGY  Chest x-ray: I independently viewed and interpreted the images; there is bibasal atelectasis with no focal consolidation or edema   PROCEDURES:  Critical Care performed: No  Procedures   MEDICATIONS ORDERED IN ED: Medications  ipratropium-albuterol (DUONEB) 0.5-2.5 (3) MG/3ML nebulizer solution 3 mL (3 mLs Nebulization Given 02/04/24 1150)  ipratropium-albuterol (DUONEB) 0.5-2.5 (3) MG/3ML nebulizer solution 3 mL (3 mLs Nebulization Given 02/04/24 1150)  methylPREDNISolone sodium succinate (SOLU-MEDROL) 125 mg/2 mL injection 125 mg (125 mg  Intravenous Given 02/04/24 1150)     IMPRESSION / MDM / ASSESSMENT AND PLAN / ED COURSE  I reviewed the triage vital signs and the nursing notes.  58 year old female with PMH as noted above presents with cough shortness of breath and some orthopnea over the last couple of days.  On exam her O2 saturation is 100% on room air.  She has diffuse wheezing bilaterally but good air entry.  There is no edema.  Differential diagnosis includes, but is not limited to, acute bronchitis, asthma exacerbation, COVID or other viral syndrome, bacterial pneumonia.  I have a low suspicion for pulmonary edema or other cardiac etiology.  We will obtain a chest x-ray, lab workup, give bronchodilators, steroid, and reassess.  Patient's presentation is most consistent with acute complicated illness / injury requiring diagnostic workup.  The  patient is on the cardiac monitor to evaluate for evidence of arrhythmia and/or significant heart rate changes.  ----------------------------------------- 2:04 PM on 02/04/2024 -----------------------------------------  Chest x-ray is clear.  BNP is negative.  Troponin is negative.  Based on the duration of the symptoms there is no indication for a repeat.  On reassessment the patient reports an improvement in her shortness of breath.  O2 saturation remains in the high 90s on room air and the patient has no increased work of breathing or respiratory distress.  She is stable for discharge home at this time.  Presentation is consistent with acute bronchitis.  I counseled her on the results of the workup and plan of care.  I have prescribed albuterol, prednisone, and azithromycin.  I gave strict return precautions and she expressed understanding.  FINAL CLINICAL IMPRESSION(S) / ED DIAGNOSES   Final diagnoses:  Acute bronchitis, unspecified organism     Rx / DC Orders   ED Discharge Orders          Ordered    predniSONE (DELTASONE) 50 MG tablet  Daily        02/04/24 1403    albuterol (PROVENTIL) (2.5 MG/3ML) 0.083% nebulizer solution  Every 6 hours PRN        02/04/24 1403    albuterol (VENTOLIN HFA) 108 (90 Base) MCG/ACT inhaler  Every 4 hours PRN        02/04/24 1403    azithromycin (ZITHROMAX Z-PAK) 250 MG tablet        02/04/24 1403             Note:  This document was prepared using Dragon voice recognition software and may include unintentional dictation errors.    Dionne Bucy, MD 02/04/24 [phone]

## 2024-02-04 NOTE — ED Triage Notes (Signed)
 Pt to ED POV for SOB since 2 days ago. Has also been coughing and having mid upper back pain with inspiration. Pt started feeling lightheaded this AM and has HA. Also intermittent R arm numbness since weeks that is associated with neck pain. Also bilateral rib pain. Diagnosed with bronchitis about 2 weeks ago. Pt has audible expiratory wheezing.

## 2024-02-04 NOTE — Discharge Instructions (Signed)
 Take the antibiotic starting today and the prednisone starting tomorrow as prescribed.  Use albuterol either via the nebulizer or inhaler every 4-6 hours for the next several days.  Follow-up with your primary care provider.  Return to the ER for new, worsening, or persistent severe shortness of breath, cough, chest pain, fever, or any other new or worsening symptoms that concern you.

## 2024-02-05 ENCOUNTER — Encounter: Payer: Self-pay | Admitting: Obstetrics and Gynecology

## 2024-02-08 ENCOUNTER — Ambulatory Visit (INDEPENDENT_AMBULATORY_CARE_PROVIDER_SITE_OTHER): Payer: MEDICAID | Admitting: Neurosurgery

## 2024-02-08 VITALS — BP 148/88 | Ht 66.0 in | Wt 306.0 lb

## 2024-02-08 DIAGNOSIS — R200 Anesthesia of skin: Secondary | ICD-10-CM | POA: Insufficient documentation

## 2024-02-08 DIAGNOSIS — R202 Paresthesia of skin: Secondary | ICD-10-CM

## 2024-02-08 NOTE — Progress Notes (Signed)
 Referring Physician:  Margarita Mail, DO [address] [place],  [place] [address]  Primary Physician:  Margarita Mail, DO  History of Present Illness: 02/08/2024 [name] is here today with a chief complaint of lower extremity neuropathy.  She has a history of diabetic polyneuropathy as well as cervicalgia and cervical radiculopathy and lumbar issues as well.  She states that her left lower extremity distal to the knee is causing her the most problems at this time.  She also has had some active issues with her right shoulder.  She is seeking orthopedic care.  She was previously in the emergency department where a DVT scan was used and found a popliteal cyst.  She states that she feels pain from this area running down the foot on the top of the bottom.  It often causes changes in sensation.  Review of Systems:  A 10 point review of systems is negative, except for the pertinent positives and negatives detailed in the HPI.  Past Medical History: Past Medical History:  Diagnosis Date   Anemia    vitamin d deficiency   Anxiety    Arthritis    Asthma    WELL CONTROLLED   Cancer of ear    skin cancer left ear   COPD (chronic obstructive pulmonary disease) (HCC)    Depression    Diabetes mellitus without complication (HCC)    Fatty liver    Hypertension    Kidney cysts    per patient, never had   Renal disorder    Sleep apnea    DOES NOT USE CPAP. waiting for new machine and a new sleep study   Stroke [facility]) May or June 2019   TIA. no residual symptoms    Past Surgical History: Past Surgical History:  Procedure Laterality Date   ABDOMINAL HYSTERECTOMY     ANTERIOR CERVICAL DECOMP/DISCECTOMY FUSION N/A 08/09/2018   Procedure: ANTERIOR CERVICAL DECOMPRESSION/DISCECTOMY FUSION 1 LEVEL- C4-5;  Surgeon: Venetia Night, MD;  Location: [facility] ORS;  Service: Neurosurgery;  Laterality: N/A;   BACK SURGERY     NECK   COLONOSCOPY WITH PROPOFOL N/A  02/26/2021   Procedure: COLONOSCOPY WITH PROPOFOL;  Surgeon: Wyline Mood, MD;  Location: [facility] ENDOSCOPY;  Service: Endoscopy;  Laterality: N/A;   DILATATION & CURETTAGE/HYSTEROSCOPY WITH MYOSURE N/A 01/19/2021   Procedure: DILATATION & CURETTAGE/HYSTEROSCOPY;  Surgeon: Hildred Laser, MD;  Location: [facility] ORS;  Service: Gynecology;  Laterality: N/A;   DILATION AND CURETTAGE OF UTERUS     ENDOMETRIAL ABLATION N/A 01/19/2021   Procedure: ENDOMETRIAL ABLATION, MINERVA;  Surgeon: Hildred Laser, MD;  Location: [facility] ORS;  Service: Gynecology;  Laterality: N/A;   ENDOMETRIAL BIOPSY     benign   ESOPHAGOGASTRODUODENOSCOPY (EGD) WITH PROPOFOL N/A 08/24/2022   Procedure: ESOPHAGOGASTRODUODENOSCOPY (EGD) WITH PROPOFOL;  Surgeon: Toney Reil, MD;  Location: [facility] ENDOSCOPY;  Service: Gastroenterology;  Laterality: N/A;   EXPLORATORY LAPAROTOMY  1992   REMOVAL OF RUPTURED ECTOPIC   HAND SURGERY Right 1998   cyst removed   HERNIA REPAIR  1975   UMBILICAL   JOINT REPLACEMENT Right 2014   TKR   KNEE ARTHROSCOPY Right 2012   KNEE SURGERY Right 2014   total knee replacement   ROBOTIC ASSISTED LAPAROSCOPIC HYSTERECTOMY AND SALPINGECTOMY Bilateral 03/15/2022   Procedure: XI ROBOTIC ASSISTED LAPAROSCOPIC HYSTERECTOMY;  Surgeon: Hildred Laser, MD;  Location: [facility] ORS;  Service: Gynecology;  Laterality: Bilateral;   SHOULDER ARTHROSCOPY WITH BICEPSTENOTOMY Left 12/14/2016   Procedure: SHOULDER ARTHROSCOPY WITH  BICEPSTENOTOMY;  Surgeon: Christena Flake, MD;  Location: [facility] ORS;  Service: Orthopedics;  Laterality: Left;   SHOULDER ARTHROSCOPY WITH OPEN ROTATOR CUFF REPAIR Left 12/14/2016   Procedure: SHOULDER ARTHROSCOPY WITH OPEN ROTATOR CUFF REPAIR AND ARTHROSCOPIC ROTATOR CUFF REPAIR;  Surgeon: Christena Flake, MD;  Location: [facility] ORS;  Service: Orthopedics;  Laterality: Left;   SHOULDER ARTHROSCOPY WITH ROTATOR CUFF REPAIR Right 01/04/2019   Procedure: SHOULDER ARTHROSCOPY WITH ROTATOR CUFF REPAIR;  Surgeon:  Christena Flake, MD;  Location: [facility] ORS;  Service: Orthopedics;  Laterality: Right;   SHOULDER ARTHROSCOPY WITH SUBACROMIAL DECOMPRESSION Left 12/14/2016   Procedure: SHOULDER ARTHROSCOPY WITH SUBACROMIAL DECOMPRESSION;  Surgeon: Christena Flake, MD;  Location: [facility] ORS;  Service: Orthopedics;  Laterality: Left;   SHOULDER ARTHROSCOPY WITH SUBACROMIAL DECOMPRESSION AND BICEP TENDON REPAIR Right 01/04/2019   Procedure: SHOULDER ARTHROSCOPY WITH DEBRIDEMENT AND SUBACROMIAL DECOMPRESSION-RIGHT;  Surgeon: Christena Flake, MD;  Location: [facility] ORS;  Service: Orthopedics;  Laterality: Right;   TOTAL KNEE ARTHROPLASTY Left 04/26/2023   Procedure: TOTAL KNEE ARTHROPLASTY;  Surgeon: Christena Flake, MD;  Location: [facility] ORS;  Service: Orthopedics;  Laterality: Left;   TUBAL LIGATION      Allergies: Allergies as of 02/08/2024 - Review Complete 02/08/2024  Allergen Reaction Noted   Dilaudid [hydromorphone hcl] Nausea And Vomiting 01/19/2021   Cymbalta [duloxetine hcl] Nausea And Vomiting 02/07/2023   Morphine and codeine Nausea And Vomiting 01/04/2019   Tramadol Nausea Only 12/29/2018    Medications:  Current Outpatient Medications:    albuterol (PROVENTIL) (2.5 MG/3ML) 0.083% nebulizer solution, Take 3 mLs (2.5 mg total) by nebulization every 6 (six) hours as needed for wheezing or shortness of breath., Disp: 75 mL, Rfl: 1   albuterol (VENTOLIN HFA) 108 (90 Base) MCG/ACT inhaler, Inhale 2 puffs into the lungs every 4 (four) hours as needed., Disp: 8 g, Rfl: 0   azithromycin (ZITHROMAX Z-PAK) 250 MG tablet, Take 2 tablets (500 mg) on  Day 1,  followed by 1 tablet (250 mg) once daily on Days 2 through 5., Disp: 6 each, Rfl: 0   gabapentin (NEURONTIN) 100 MG capsule, Take 100 mg by mouth 3 (three) times daily., Disp: , Rfl:    Glucagon (GVOKE HYPOPEN 1-PACK) 1 MG/0.2ML SOAJ, Inject 1 mg into the skin as needed (hypogylcemia)., Disp: , Rfl:    glucose blood (ACCU-CHEK GUIDE) test strip, Test blood sugar tid, Dx  E11.65, Disp: 100 each, Rfl: 12   OZEMPIC, 1 MG/DOSE, 4 MG/3ML SOPN, INJECT 1 MG UNDER THE SKIN ONCE WEEKLY, Disp: 3 mL, Rfl: 2   predniSONE (DELTASONE) 50 MG tablet, Take 1 tablet (50 mg total) by mouth daily at 12 noon for 4 days. Start the day after the ER visit, Disp: 4 tablet, Rfl: 0   tiZANidine (ZANAFLEX) 4 MG tablet, Take 4 mg by mouth 3 (three) times daily., Disp: , Rfl:   Social History: Social History   Tobacco Use   Smoking status: Former    Current packs/day: 0.00    Average packs/day: 2.0 packs/day for 25.0 years (50.0 ttl pk-yrs)    Types: Cigarettes    Start date: 06/09/1992    Quit date: 06/09/2017    Years since quitting: 6.6   Smokeless tobacco: Never  Vaping Use   Vaping status: Never Used  Substance Use Topics   Alcohol use: No   Drug use: Yes    Frequency: 7.0 times per week    Types: Marijuana    Comment: pt states as needed  when she is hurting real bad    Family Medical History: Family History  Problem Relation Age of Onset   Hypertension Mother    Thyroid disease Mother    Lupus Mother    Congestive Heart Failure Mother    Hypertension Brother    CAD Maternal Grandmother    Breast cancer Neg Hx    Ovarian cancer Neg Hx    Colon cancer Neg Hx     Physical Examination: Vitals:   02/08/24 1326  BP: (!) 148/88    General: Patient is in no apparent distress. Attention to examination is appropriate.  Neck:   Supple.  Full range of motion.  Respiratory: Patient is breathing without any difficulty.   NEUROLOGICAL:     Awake, alert, oriented to person, place, and time.  Speech is clear and fluent.   Cranial Nerves: Pupils equal round and reactive to light.  Facial tone is symmetric. Shoulder shrug is symmetric. Tongue protrusion is midline.  There is no pronator drift.  Motor Exam:  Bilateral lower extremity exam shows good exertion of her dorsiflexion plantarflexion and toe extension and toe flexion.  She has some pain limited knee  extension.  Pain limited hip flexion.  Reflexes are trace throughout  Decreased lower extremity sensation bilaterally, left worse than right,, involving also the tibial distribution whereas previously this was spared   Medical Decision Making Narrative & Impression  CLINICAL DATA:  58 year old female with persistent left lower extremity pain. Prior knee replacement. Edema.   EXAM: LEFT LOWER EXTREMITY VENOUS DOPPLER ULTRASOUND   TECHNIQUE: Gray-scale sonography with compression, as well as color and duplex ultrasound, were performed to evaluate the deep venous system(s) from the level of the common femoral vein through the popliteal and proximal calf veins.   COMPARISON:  05/09/2023 Doppler ultrasound.   FINDINGS: VENOUS   Normal compressibility of the common femoral, superficial femoral, and popliteal veins, as well as the visualized calf veins. Visualized portions of profunda femoral vein and great saphenous vein unremarkable. No filling defects to suggest DVT on grayscale or color Doppler imaging. Doppler waveforms show normal direction of venous flow, normal respiratory plasticity and response to augmentation.   Limited views of the contralateral common femoral vein are unremarkable.   OTHER   Elongated nonvascular appearing (images 39, 43) fluid collection in the popliteal fossa encompasses 48 x 13 x 26 mm, not demonstrated on the previous exam.   Normal left inguinal lymph node with fatty hilum incidentally noted.   Limitations: none   IMPRESSION: 1.  No evidence of left lower extremity deep venous thrombosis. 2. Left popliteal fossa elongated up to 4.8 cm, joint-related cyst vs postoperative fluid collection.     Electronically Signed   By: Odessa Fleming M.D.   On: 01/12/2024 09:18    I have personally reviewed the images and electrodiagnostics and agree with the above interpretation.  Assessment and Plan: [name] is a pleasant 58 y.o. female with  history of a bilateral knee replacement.  She also has a longstanding history of diabetic neuropathy which she has been treated with gabapentin for over 10 years.   She is here today for follow-up given her left lower extremity pain has been worsening.  She feels numbness and tingling on the dorsum and plantar portion of her foot.  She gets numbness going down from her knee down.  She has not noticed any new weakness.  She is recently in the ER and diagnosed with a popliteal fossa cyst.  She is here today for follow-up.  She is also going to see orthopedics next week for her right shoulder.  The symptoms in her left lower extremity does seem to be in the sciatic distribution.  This is reproduced with palpation of her posterior popliteal cyst.  Likely this is abutting the sciatic nerve or the peroneal and tibial nerve separately, based off of her bifurcation.  She has an upcoming appointment with orthopedics.  I reached out to their team and given an update about the popliteal fossa cyst causing some neuritis or neuropathy.  There is no motor weakness at this point.  I will defer treatment of the Baker's cyst to them.  Should they not find anything wrong with her shoulder we could also get repeat x-rays of her cervical spine.  Thank you for involving me in the care of this patient.    Lovenia Kim MD/MSCR Neurosurgery - Peripheral Nerve Surgery    Spent 20 minutes with the patient discussing her care.  Evaluating her current active issues, and coordinating her care going forward.

## 2024-05-08 ENCOUNTER — Ambulatory Visit: Payer: Self-pay

## 2024-05-08 NOTE — Telephone Encounter (Signed)
 FYI Only or Action Required?: FYI only for provider  Patient was last seen in primary care on 08/29/2023 by Quinton Buckler, FNP. Called Nurse Triage reporting Joint Swelling. Symptoms began several months ago. Interventions attempted: OTC medications: aleve  and Rest, hydration, or home remedies. Symptoms are: gradually worsening.  Triage Disposition: See PCP When Office is Open (Within 3 Days)  Patient/caregiver understands and will follow disposition?: Yes, will follow disposition  Copied from CRM [id number]. Topic: Clinical - Red Word Triage >> May 08, 2024  9:08 AM Georgeann Kindred wrote: Red Word that prompted transfer to Nurse Triage: Patient called in with complaints of swelling for the left leg from behind the knee all the way down to her foot. She is also experiencing some severe pain in addition to the swelling. Patient states that the swelling and pain has been happening since she had a total knee replacement surgery last year and has been getting worse. She has self medicated with Goody Powder and Aleve . For a moment the medication was working, however, since the pain and swelling has gotten worse the OTC medications are no longer working to relieve the pain nor swelling. She is taking 6 Aleve  220 MG at a time. She has stopped taking the ALEVE  220 MG now that her stomach is beginning to hurt. Her stomach began hurting last week; feels like there is a whole in her stomach, like a hunger pain. She indicates that she also has some spurs in her neck. Reason for Disposition  [1] MODERATE pain (e.g., interferes with normal activities, limping) AND [2] present > 3 days  Answer Assessment - Initial Assessment Questions 1. LOCATION and RADIATION: Where is the pain located?      L leg/knee pain and swelling 2. QUALITY: What does the pain feel like?  (e.g., sharp, dull, aching, burning)     Throbbing, burning, knee locks 3. SEVERITY: How bad is the pain? What does it keep you from doing?   (Scale  1-10; or mild, moderate, severe)   -  MILD (1-3): doesn't interfere with normal activities    -  MODERATE (4-7): interferes with normal activities (e.g., work or school) or awakens from sleep, limping    -  SEVERE (8-10): excruciating pain, unable to do any normal activities, unable to walk     10 4. ONSET: When did the pain start? Does it come and go, or is it there all the time?     Ongoing since knee replacement 5. RECURRENT: Have you had this pain before? If Yes, ask: When, and what happened then?     Never went away after knee replacement 6. SETTING: Has there been any recent work, exercise or other activity that involved that part of the body?      denies 7. AGGRAVATING FACTORS: What makes the knee pain worse? (e.g., walking, climbing stairs, running)     Aleve  was helping originally, has stopped aleve  now d/t abd pain 8. ASSOCIATED SYMPTOMS: Is there any swelling or redness of the knee?     Swelling, can feel the fluid moving around 9. OTHER SYMPTOMS: Do you have any other symptoms? (e.g., chest pain, difficulty breathing, fever, calf pain) denies  Protocols used: Knee Pain-A-AH

## 2024-05-10 ENCOUNTER — Ambulatory Visit (INDEPENDENT_AMBULATORY_CARE_PROVIDER_SITE_OTHER): Payer: MEDICAID | Admitting: Internal Medicine

## 2024-05-10 ENCOUNTER — Encounter: Payer: Self-pay | Admitting: Internal Medicine

## 2024-05-10 ENCOUNTER — Other Ambulatory Visit: Payer: Self-pay

## 2024-05-10 VITALS — BP 134/78 | HR 102 | Temp 98.1°F | Resp 18 | Ht 66.0 in | Wt 321.3 lb

## 2024-05-10 DIAGNOSIS — M25462 Effusion, left knee: Secondary | ICD-10-CM

## 2024-05-10 DIAGNOSIS — M25562 Pain in left knee: Secondary | ICD-10-CM

## 2024-05-10 DIAGNOSIS — Z794 Long term (current) use of insulin: Secondary | ICD-10-CM | POA: Diagnosis not present

## 2024-05-10 DIAGNOSIS — E1165 Type 2 diabetes mellitus with hyperglycemia: Secondary | ICD-10-CM | POA: Diagnosis not present

## 2024-05-10 MED ORDER — OZEMPIC (0.25 OR 0.5 MG/DOSE) 2 MG/3ML ~~LOC~~ SOPN: 0.5000 mg | PEN_INJECTOR | SUBCUTANEOUS | 0 refills | Status: DC

## 2024-05-10 MED ORDER — NAPROXEN 500 MG PO TABS: 500.0000 mg | ORAL_TABLET | Freq: Two times a day (BID) | ORAL | 0 refills | Status: AC

## 2024-05-10 NOTE — Progress Notes (Signed)
 Acute Office Visit  Subjective:     Patient ID: Brenda Hicks, female    DOB: 19-Jan-1966, 58 y.o.   MRN: [id number]  Chief Complaint  Patient presents with   Joint Swelling    Left knee pain and swelling seen in ER    HPI Patient is in today for left knee swelling and pain. History of TKA March 2024, has been having issues since.   Discussed the use of AI scribe software for clinical note transcription with the patient, who gave verbal consent to proceed.  History of Present Illness  Brenda Hicks is a 58 year old female who presents with knee pain and swelling following knee replacement surgery.  She has significant knee pain and swelling since knee replacement surgery in March of the previous year. The swelling has worsened, with the affected leg larger than the other. She lacks sensation in her toes and feels a pulling sensation in the back of her knee. An emergency room visit revealed joint effusion, and an ultrasound suggested a possible Baker's cyst. An injection did not alleviate her symptoms.  She experiences significant stress and depression following the loss of her mother, father, and son. She spends most of her time in her room due to fear of falling, having fallen twice recently. This impacts her mobility and social interactions, contributing to her depressive symptoms.  She has been off Ozempic  since March due to pharmacy issues, affecting her weight management. Her blood sugar levels have been relatively stable, with the highest reading being 190 mg/dL upon waking.   Review of Systems  Constitutional:  Negative for chills and fever.  Musculoskeletal:  Positive for falls and joint pain.  Skin: Negative.         Objective:    BP 134/78 (Cuff Size: Large)   Pulse (!) 102   Temp 98.1 F (36.7 C) (Oral)   Resp 18   Ht 5' 6 (1.676 m)   Wt (!) 321 lb 4.8 oz (145.7 kg)   LMP 02/22/2022 (Approximate)   SpO2 99%   BMI 51.86 kg/m  BP Readings from Last 3  Encounters:  05/10/24 134/78  02/08/24 (!) 148/88  02/04/24 (!) 159/82   Wt Readings from Last 3 Encounters:  05/10/24 (!) 321 lb 4.8 oz (145.7 kg)  02/08/24 (!) 306 lb (138.8 kg)  02/04/24 (!) 312 lb (141.5 kg)      Physical Exam Constitutional:      Appearance: Normal appearance.  HENT:     Head: Normocephalic and atraumatic.   Eyes:     Conjunctiva/sclera: Conjunctivae normal.    Cardiovascular:     Rate and Rhythm: Normal rate and regular rhythm.  Pulmonary:     Effort: Pulmonary effort is normal.     Breath sounds: Normal breath sounds.   Musculoskeletal:     Left knee: Swelling and bony tenderness present. Normal range of motion. Tenderness present over the lateral joint line.   Skin:    General: Skin is warm and dry.   Neurological:     General: No focal deficit present.     Mental Status: She is alert. Mental status is at baseline.   Psychiatric:        Mood and Affect: Mood normal.        Behavior: Behavior normal.     No results found for any visits on 05/10/24.      Assessment & Plan:   Assessment & Plan  Knee Pain with Suspected Baker's  Cyst Chronic knee pain post-replacement with swelling and numbness. Ultrasound suggests joint fluid, possible Baker's cyst. Previous steroid injection ineffective. Current orthopedist unsatisfactory. - Refer to [facility] orthopedist for second opinion. - Prescribe naproxen  500 mg twice daily with food for 10 days.   Type 2 Diabetes Mellitus Discontinued Ozempic  due to pharmacy issues. Blood glucose around 190 mg/dL. Discussed restarting Ozempic  for weight and appetite control. - Send new prescription for Ozempic  0.5 mg weekly to [facility] on [address]. - Refer to pharmacist Timoteo Force for medication access assistance. - Schedule follow-up in 3 months for labs and potential Ozempic  dosage adjustment.  General Health Maintenance No recent physical or eye examination. Reports vision problems and needs new eye doctor  due to insurance changes. - Encourage scheduling a physical examination. - Advise finding a new eye doctor for vision assessment.   - AMB referral to orthopedics - naproxen  (NAPROSYN ) 500 MG tablet; Take 1 tablet (500 mg total) by mouth 2 (two) times daily with a meal for 10 days.  Dispense: 20 tablet; Refill: 0 - AMB Referral [facility] - Semaglutide ,0.25 or 0.5MG /DOS, (OZEMPIC , 0.25 OR 0.5 MG/DOSE,) 2 MG/3ML SOPN; Inject 0.5 mg into the skin once a week.  Dispense: 6 mL; Refill: 0    Return in about 3 months (around 08/10/2024).  Rockney Cid, DO

## 2024-05-14 ENCOUNTER — Emergency Department
Admission: EM | Admit: 2024-05-14 | Discharge: 2024-05-14 | Disposition: A | Discharge status: Home / Self Care | Payer: MEDICAID

## 2024-05-14 ENCOUNTER — Other Ambulatory Visit: Payer: Self-pay

## 2024-05-14 ENCOUNTER — Emergency Department: Payer: MEDICAID

## 2024-05-14 ENCOUNTER — Encounter: Payer: Self-pay | Admitting: Intensive Care

## 2024-05-14 DIAGNOSIS — W19XXXA Unspecified fall, initial encounter: Secondary | ICD-10-CM

## 2024-05-14 DIAGNOSIS — S161XXA Strain of muscle, fascia and tendon at neck level, initial encounter: Secondary | ICD-10-CM | POA: Insufficient documentation

## 2024-05-14 DIAGNOSIS — I10 Essential (primary) hypertension: Secondary | ICD-10-CM | POA: Diagnosis not present

## 2024-05-14 DIAGNOSIS — E119 Type 2 diabetes mellitus without complications: Secondary | ICD-10-CM | POA: Insufficient documentation

## 2024-05-14 DIAGNOSIS — S199XXA Unspecified injury of neck, initial encounter: Secondary | ICD-10-CM | POA: Diagnosis present

## 2024-05-14 DIAGNOSIS — J4489 Other specified chronic obstructive pulmonary disease: Secondary | ICD-10-CM | POA: Diagnosis not present

## 2024-05-14 DIAGNOSIS — W07XXXA Fall from chair, initial encounter: Secondary | ICD-10-CM | POA: Diagnosis not present

## 2024-05-14 MED ORDER — LIDOCAINE 5 % EX PTCH
1.0000 | MEDICATED_PATCH | Freq: Once | CUTANEOUS | Status: DC
  Administered 2024-05-14: 1 via TRANSDERMAL
  Filled 2024-05-14: qty 1

## 2024-05-14 MED ORDER — LIDOCAINE 5 % EX PTCH: 1.0000 | MEDICATED_PATCH | CUTANEOUS | 0 refills | Status: AC

## 2024-05-14 MED ORDER — ACETAMINOPHEN 500 MG PO TABS
1000.0000 mg | ORAL_TABLET | Freq: Once | ORAL | Status: AC
  Administered 2024-05-14: 1000 mg via ORAL
  Filled 2024-05-14: qty 2

## 2024-05-14 MED ORDER — ACETAMINOPHEN 500 MG PO TABS: 1000.0000 mg | ORAL_TABLET | Freq: Four times a day (QID) | ORAL | 2 refills | Status: AC | PRN

## 2024-05-14 MED ORDER — ACETAMINOPHEN 500 MG PO TABS: 1000.0000 mg | ORAL_TABLET | Freq: Four times a day (QID) | ORAL | 2 refills | Status: DC | PRN

## 2024-05-14 MED ORDER — LIDOCAINE 5 % EX PTCH: 1.0000 | MEDICATED_PATCH | CUTANEOUS | 0 refills | Status: DC

## 2024-05-14 NOTE — ED Triage Notes (Signed)
 Patient reports her left knee gave out on her this AM and caused her to fall. Reports pain on left side. Hit head during fall but denies LOC.

## 2024-05-14 NOTE — ED Provider Notes (Signed)
 [facility] Provider Note    Event Date/Time   First MD Initiated Contact with Patient 05/14/24 1227     (approximate)   History   Fall  Patient reports her left knee gave out on her this AM and caused her to fall. Reports pain on left side. Hit head during fall but denies LOC.    HPI Brenda Hicks is a 58 y.o. female  pmh copd/asthma, prior cva, htn DM presents for eval after fall - Patient was getting up from her chair earlier today when her left knee gave out on her, fell onto her left side, primarily hit her chest, head, neck.  Took Aleve  with minimal relief.  Has been ambulatory since then.  Has chronic issues with left knee pain and weakness, he is status post knee replacement in that leg. - No LOC, no vomiting - Otherwise has been in her usual state of health with no recent infectious symptoms      Physical Exam   Triage Vital Signs: ED Triage Vitals [05/14/24 1122]  Encounter Vitals Group     BP (!) 136/100     Girls Systolic BP Percentile      Girls Diastolic BP Percentile      Boys Systolic BP Percentile      Boys Diastolic BP Percentile      Pulse Rate 79     Resp 18     Temp 98.4 F (36.9 C)     Temp Source Oral     SpO2 99 %     Weight (!) 321 lb (145.6 kg)     Height 5' 6 (1.676 m)     Head Circumference      Peak Flow      Pain Score 10     Pain Loc      Pain Education      Exclude from Growth Chart     Most recent vital signs: Vitals:   05/14/24 1122  BP: (!) 136/100  Pulse: 79  Resp: 18  Temp: 98.4 F (36.9 C)  SpO2: 99%     General: Awake, no distress.  HEENT: Normocephalic, atraumatic Cspine:  Some tenderness in mid C-spine as well as left paraspinal region, no obvious step-off appreciated CV:  Good peripheral perfusion. RRR, RP 2+ Resp:  Normal effort. CTAB Abd:  No distention. Nontender to deep palpation throughout Other:  Full range of motion of joints and no tenderness to palpation throughout left  upper and lower extremity   ED Results / Procedures / Treatments   Labs (all labs ordered are listed, but only abnormal results are displayed) Labs Reviewed - No data to display   EKG  N/a   RADIOLOGY Radiology interpreted by myself and radiology reports reviewed.  No acute pathology appreciated.    PROCEDURES:  Critical Care performed: No  Procedures   MEDICATIONS ORDERED IN ED: Medications  lidocaine  (LIDODERM ) 5 % 1 patch (1 patch Transdermal Patch Applied 05/14/24 1308)  acetaminophen  (TYLENOL ) tablet 1,000 mg (1,000 mg Oral Given 05/14/24 1307)     IMPRESSION / MDM / ASSESSMENT AND PLAN / ED COURSE  I reviewed the triage vital signs and the nursing notes.                              DDX/MDM/AP: Differential diagnosis includes, but is not limited to, intracranial hemorrhage, skull fracture, C-spine fracture.  Consider left rib fracture.  No evidence of extremity injury.  Patient gives mechanical history of fall, no clinical concern for underlying organic etiology at this time.  Plan: - CT head, CT C-spine - X-ray left ribs  -Tylenol , Lidoderm  patch - Reassess  Patient's presentation is most consistent with acute presentation with potential threat to life or bodily function.   ED course below.  Workup unremarkable, no traumatic findings.  Patient primarily with left paraspinal tenderness on exam, do not suspect underlying ligamentous injury, full range of motion, no significant midline tenderness on reeval.  C-spine cleared clinically.  Stable for discharge home.  Clinical Course as of 05/14/24 1442  Mon May 14, 2024  1351 CT Cspine [MM]  1440 Patient ambulating to bathroom without difficulty  Discussed reassuring workup.  Amenable to discharge home.  Plan to continue Tylenol , Lidoderm  patches.  Plan for PMD follow-up as needed.  ED return precautions in place.  Patient agrees with plan. [MM]    Clinical Course User Index [MM] Clarine Ozell LABOR, MD      FINAL CLINICAL IMPRESSION(S) / ED DIAGNOSES   Final diagnoses:  Fall, initial encounter  Strain of neck muscle, initial encounter     Rx / DC Orders   ED Discharge Orders          Ordered    acetaminophen  (TYLENOL ) 500 MG tablet  Every 6 hours PRN        05/14/24 1441    lidocaine  (LIDODERM ) 5 %  Every 24 hours        05/14/24 1441             Note:  This document was prepared using Dragon voice recognition software and may include unintentional dictation errors.   Clarine Ozell LABOR, MD 05/14/24 [phone]

## 2024-05-14 NOTE — Discharge Instructions (Signed)
 Your evaluation in the emergency department was overall reassuring, and we saw no traumatic injuries from your fall.  You can use Tylenol  and Lidoderm  patches as needed for any ongoing discomfort.  Please do follow-up with your primary care provider for any ongoing symptoms, and return to the emergency department with any new or worsening symptoms.

## 2024-05-15 ENCOUNTER — Telehealth: Payer: Self-pay

## 2024-05-15 ENCOUNTER — Encounter: Payer: Self-pay | Admitting: Internal Medicine

## 2024-05-15 NOTE — Progress Notes (Signed)
 Complex Care Management Note Care Guide Note  05/15/2024 Name: Brenda Hicks MRN: [id number] DOB: [birth year]   Complex Care Management Outreach Attempts: An unsuccessful telephone outreach was attempted today to offer the patient information about available complex care management services.  Follow Up Plan:  Additional outreach attempts will be made to offer the patient complex care management information and services.   Encounter Outcome:  No Answer  [facility]  [facility], [facility] Care Management Assistant Direct Dial: [phone]  Fax: [phone]

## 2024-05-16 ENCOUNTER — Telehealth: Payer: Self-pay

## 2024-05-16 ENCOUNTER — Ambulatory Visit
Admission: RE | Admit: 2024-05-16 | Discharge: 2024-05-16 | Disposition: A | Discharge status: Home / Self Care | Payer: MEDICAID | Attending: Orthopedic Surgery | Admitting: Orthopedic Surgery

## 2024-05-16 ENCOUNTER — Ambulatory Visit
Admission: RE | Admit: 2024-05-16 | Discharge: 2024-05-16 | Disposition: A | Discharge status: Home / Self Care | Payer: MEDICAID | Source: Ambulatory Visit | Attending: Orthopedic Surgery | Admitting: Orthopedic Surgery

## 2024-05-16 DIAGNOSIS — M25562 Pain in left knee: Secondary | ICD-10-CM | POA: Diagnosis present

## 2024-05-16 DIAGNOSIS — G8929 Other chronic pain: Secondary | ICD-10-CM | POA: Insufficient documentation

## 2024-05-16 NOTE — Telephone Encounter (Signed)
 Called and spoke to patient and she stated that she has been going back to the Dr. Sharman done her TKA last march and he is not doing anything about what is going on with her knee  she having pain in the back of the knee and her Primary dr sent her to us .  She was made aware to go to [facility] for X rays

## 2024-05-17 ENCOUNTER — Ambulatory Visit (INDEPENDENT_AMBULATORY_CARE_PROVIDER_SITE_OTHER): Payer: MEDICAID | Admitting: Orthopedic Surgery

## 2024-05-17 VITALS — BP 178/110 | Ht 66.0 in | Wt 321.0 lb

## 2024-05-17 DIAGNOSIS — T8484XA Pain due to internal orthopedic prosthetic devices, implants and grafts, initial encounter: Secondary | ICD-10-CM | POA: Diagnosis not present

## 2024-05-17 DIAGNOSIS — Z96652 Presence of left artificial knee joint: Secondary | ICD-10-CM | POA: Diagnosis not present

## 2024-05-18 NOTE — Progress Notes (Signed)
 New Patient Visit  Assessment: Brenda Hicks is a 58 y.o. female with the following: Posterior left knee pain status post left total knee arthroplasty  Plan: Legaci J Stolar has pain in the posterior aspect of the left knee.  She underwent left total knee arthroplasty by Dr. Edie, just over a year ago.  Acute worsening of pain after recent fall.  Radiographs available clinic today do not demonstrate any obvious signs of loosening.  No acute fractures.  Overall alignment remains unchanged from postoperative radiographs.  She is also having numbness and tingling distal to her knee.  She states that this all started after her total knee arthroplasty.  Low concern for infection based on her appearance today.  At this point, I am not sure what is causing her discomfort.  It is also not clear what has been conveyed to her by both Dr. Edie, as well as Dr. Claudene who evaluated her lower back.  I communicated this with the patient.  I will try and reach out to both surgeons collectively, to see if we can better assess what might be happening.  She is in agreement with this plan.  She will continue to communicate with our office, and is okay to use MyChart.  For now, follow-up as needed.  Follow-up: Return if symptoms worsen or fail to improve.  Subjective:  Chief Complaint  Patient presents with   Knee Pain    Patient fell on Monday at 6 am on her way to the bathroom and landed on her left side and the knee is very painful.  She is hardly walking due to the pain. She is currently taking aleve  and it is not helping      History of Present Illness: Brenda Hicks is a 58 y.o. female who has been referred by Sharyle Fischer, DO for evaluation of left knee pain.  She underwent a left total knee arthroplasty a little over a year ago with Dr. Edie.  She states that she did not have any complications following surgery.  However, since undergoing surgery, her knee has continued to bother her.  She has  pain and tenderness in the posterior aspect of the left knee.  She describes a swelling.  No drainage or issues with her incision.  In addition, she has had numbness extending from the lateral knee distal over the lateral portion of her foot.  She has been evaluated by neurosurgery.  EMG results demonstrates possible neuropathy.  She denies fevers or chills.  She fell recently, and has had acute worsening of pain in the left knee.   Review of Systems: No fevers or chills No numbness or tingling No chest pain No shortness of breath No bowel or bladder dysfunction No GI distress No headaches   Medical History:  Past Medical History:  Diagnosis Date   Anemia    vitamin d deficiency   Anxiety    Arthritis    Asthma    WELL CONTROLLED   Cancer of ear    skin cancer left ear   COPD (chronic obstructive pulmonary disease) (HCC)    Depression    Diabetes mellitus without complication (HCC)    Fatty liver    Hypertension    Kidney cysts    per patient, never had   Renal disorder    Sleep apnea    DOES NOT USE CPAP. waiting for new machine and a new sleep study   Stroke [facility]) May or June 2019   TIA. no  residual symptoms    Past Surgical History:  Procedure Laterality Date   ABDOMINAL HYSTERECTOMY     ANTERIOR CERVICAL DECOMP/DISCECTOMY FUSION N/A 08/09/2018   Procedure: ANTERIOR CERVICAL DECOMPRESSION/DISCECTOMY FUSION 1 LEVEL- C4-5;  Surgeon: Clois Fret, MD;  Location: [facility] ORS;  Service: Neurosurgery;  Laterality: N/A;   BACK SURGERY     NECK   COLONOSCOPY WITH PROPOFOL  N/A 02/26/2021   Procedure: COLONOSCOPY WITH PROPOFOL ;  Surgeon: Therisa Bi, MD;  Location: [facility];  Service: Endoscopy;  Laterality: N/A;   DILATATION & CURETTAGE/HYSTEROSCOPY WITH MYOSURE N/A 01/19/2021   Procedure: DILATATION & CURETTAGE/HYSTEROSCOPY;  Surgeon: Connell Davies, MD;  Location: [facility] ORS;  Service: Gynecology;  Laterality: N/A;   DILATION AND CURETTAGE OF UTERUS      ENDOMETRIAL ABLATION N/A 01/19/2021   Procedure: ENDOMETRIAL ABLATION, MINERVA;  Surgeon: Connell Davies, MD;  Location: [facility] OR[facility];  Service: Gynecology;  Laterality: N/A;   ENDOMETRIAL BIOPSY     benign   ESOPHAGOGASTRODUODENOSCOPY (EGD) WITH PROPOFOL  N/A 08/24/2022   Procedure: ESOPHAGOGASTRODUODENOSCOPY (EGD) WITH PROPOFOL ;  Surgeon: Unk Corinn Skiff, MD;  Location: [facility] ENDOSCOPY;  Service: Gastroenterology;  Laterality: N/A;   EXPLORATORY LAPAROTOMY  1992   REMOVAL OF RUPTURED ECTOPIC   HAND SURGERY Right 1998   cyst removed   HERNIA REPAIR  1975   UMBILICAL   JOINT REPLACEMENT Right 2014   TKR   KNEE ARTHROSCOPY Right 2012   KNEE SURGERY Right 2014   total knee replacement   ROBOTIC ASSISTED LAPAROSCOPIC HYSTERECTOMY AND SALPINGECTOMY Bilateral 03/15/2022   Procedure: XI ROBOTIC ASSISTED LAPAROSCOPIC HYSTERECTOMY;  Surgeon: Connell Davies, MD;  Location: [facility];  Service: Gynecology;  Laterality: Bilateral;   SHOULDER ARTHROSCOPY WITH BICEPSTENOTOMY Left 12/14/2016   Procedure: SHOULDER ARTHROSCOPY WITH BICEPSTENOTOMY;  Surgeon: Norleen JINNY Maltos, MD;  Location: [facility];  Service: Orthopedics;  Laterality: Left;   SHOULDER ARTHROSCOPY WITH OPEN ROTATOR CUFF REPAIR Left 12/14/2016   Procedure: SHOULDER ARTHROSCOPY WITH OPEN ROTATOR CUFF REPAIR AND ARTHROSCOPIC ROTATOR CUFF REPAIR;  Surgeon: Norleen JINNY Maltos, MD;  Location: [facility];  Service: Orthopedics;  Laterality: Left;   SHOULDER ARTHROSCOPY WITH ROTATOR CUFF REPAIR Right 01/04/2019   Procedure: SHOULDER ARTHROSCOPY WITH ROTATOR CUFF REPAIR;  Surgeon: Maltos Norleen JINNY, MD;  Location: [facility];  Service: Orthopedics;  Laterality: Right;   SHOULDER ARTHROSCOPY WITH SUBACROMIAL DECOMPRESSION Left 12/14/2016   Procedure: SHOULDER ARTHROSCOPY WITH SUBACROMIAL DECOMPRESSION;  Surgeon: Norleen JINNY Maltos, MD;  Location: [facility];  Service: Orthopedics;  Laterality: Left;   SHOULDER ARTHROSCOPY WITH SUBACROMIAL DECOMPRESSION AND BICEP TENDON REPAIR  Right 01/04/2019   Procedure: SHOULDER ARTHROSCOPY WITH DEBRIDEMENT AND SUBACROMIAL DECOMPRESSION-RIGHT;  Surgeon: Maltos Norleen JINNY, MD;  Location: [facility];  Service: Orthopedics;  Laterality: Right;   TOTAL KNEE ARTHROPLASTY Left 04/26/2023   Procedure: TOTAL KNEE ARTHROPLASTY;  Surgeon: Maltos Norleen JINNY, MD;  Location: [facility];  Service: Orthopedics;  Laterality: Left;   TUBAL LIGATION      Family History  Problem Relation Age of Onset   Hypertension Mother    Thyroid  disease Mother    Lupus Mother    Congestive Heart Failure Mother    Hypertension Brother    CAD Maternal Grandmother    Breast cancer Neg Hx    Ovarian cancer Neg Hx    Colon cancer Neg Hx    Social History   Tobacco Use   Smoking status: Former    Current packs/day: 0.00    Average packs/day: 2.0 packs/day for 25.0 years (50.0 ttl pk-yrs)  Types: Cigarettes    Start date: 06/09/1992    Quit date: 06/09/2017    Years since quitting: 6.9   Smokeless tobacco: Never  Vaping Use   Vaping status: Never Used  Substance Use Topics   Alcohol use: No   Drug use: Yes    Frequency: 7.0 times per week    Types: Marijuana    Comment: pt states as needed when she is hurting real bad    Allergies  Allergen Reactions   Dilaudid  [Hydromorphone  Hcl] Nausea And Vomiting   Cymbalta  [Duloxetine  Hcl] Nausea And Vomiting   Morphine  And Codeine Nausea And Vomiting   Tramadol  Nausea Only    If she takes antinausea medicine with this medicine, then she can tolerate it.    Current Meds  Medication Sig   acetaminophen  (TYLENOL ) 500 MG tablet Take 2 tablets (1,000 mg total) by mouth every 6 (six) hours as needed.   albuterol  (PROVENTIL ) (2.5 MG/3ML) 0.083% nebulizer solution Take 3 mLs (2.5 mg total) by nebulization every 6 (six) hours as needed for wheezing or shortness of breath.   albuterol  (VENTOLIN  HFA) 108 (90 Base) MCG/ACT inhaler Inhale 2 puffs into the lungs every 4 (four) hours as needed.   Glucagon  (GVOKE HYPOPEN   1-PACK) 1 MG/0.2ML SOAJ Inject 1 mg into the skin as needed (hypogylcemia).   lidocaine  (LIDODERM ) 5 % Place 1 patch onto the skin daily for 10 days. Remove & Discard patch within 12 hours or as directed by MD   naproxen  (NAPROSYN ) 500 MG tablet Take 1 tablet (500 mg total) by mouth 2 (two) times daily with a meal for 10 days.   Semaglutide ,0.25 or 0.5MG /DOS, (OZEMPIC , 0.25 OR 0.5 MG/DOSE,) 2 MG/3ML SOPN Inject 0.5 mg into the skin once a week.   tiZANidine  (ZANAFLEX ) 4 MG tablet Take 4 mg by mouth 3 (three) times daily.    Objective: BP (!) 178/110   Ht 5' 6 (1.676 m)   Wt (!) 321 lb (145.6 kg)   LMP 02/22/2022 (Approximate)   BMI 51.81 kg/m   Physical Exam:  General: Alert and oriented. and No acute distress. Gait: Left sided antalgic gait.  Evaluation of the left knee demonstrates a well-healed surgical incision over the anterior knee.  There is no obvious swelling.  No instability to varus or valgus stress.  The knee feels stable overall.  She has good range of motion.  Negative straight leg raise.  She has tenderness in the posterior knee.  No bruising.  No obvious swelling.  Decreased sensation to the dorsum of the foot.  IMAGING: I personally reviewed images previously obtained in clinic   X-rays of the left knee were available clinic today.  Total knee arthroplasty in stable alignment.  No acute fractures.  No subsidence.  No lucency.   New Medications:  No orders of the defined types were placed in this encounter.     Oneil DELENA Horde, MD  05/18/2024 8:17 AM

## 2024-05-21 NOTE — Progress Notes (Signed)
 Complex Care Management Note  Care Guide Note 05/21/2024 Name: XITLALI KASTENS MRN: [id number] DOB: 10/02/1966  Earle JINNY Bier is a 58 y.o. year old female who sees Bernardo Fend, DO for primary care. I reached out to [employer] by phone today to offer complex care management services.  Ms. Paczkowski was given information about Complex Care Management services today including:   The Complex Care Management services include support from the care team which includes your Nurse Care Manager, Clinical Social Worker, or Pharmacist.  The Complex Care Management team is here to help remove barriers to the health concerns and goals most important to you. Complex Care Management services are voluntary, and the patient may decline or stop services at any time by request to their care team member.   Complex Care Management Consent Status: Patient agreed to services and verbal consent obtained.   Follow up plan:  Telephone appointment with complex care management team member scheduled for:  05/23/24 at 10:00 a.m.   Encounter Outcome:  Patient Scheduled  Dreama [facility]  [facility], [facility] Care Management Assistant Direct Dial: [phone]  Fax: [phone]

## 2024-05-23 ENCOUNTER — Other Ambulatory Visit: Payer: Self-pay

## 2024-05-23 ENCOUNTER — Telehealth: Payer: Self-pay

## 2024-05-23 ENCOUNTER — Other Ambulatory Visit (HOSPITAL_COMMUNITY): Payer: Self-pay

## 2024-05-23 DIAGNOSIS — E1165 Type 2 diabetes mellitus with hyperglycemia: Secondary | ICD-10-CM

## 2024-05-23 NOTE — Progress Notes (Addendum)
 05/23/2024 Name: Brenda Hicks MRN: [id number] DOB: June 11, 1966  Chief Complaint  Patient presents with   Medication Assistance    Brenda Hicks is a 58 y.o. year old female who presented for a telephone visit.   They were referred to the pharmacist by their PCP for assistance in managing medication access.    Subjective:  Care Team: Primary Care Provider: Bernardo Fend, DO ; Next Scheduled Visit: 08/10/2024   Medication Access/Adherence  Current Pharmacy:  [id number] [place] [place], [place] - [address] AT [facility] OF [address] [place] [address] Phone: [phone] Fax: [phone]  [facility] [address] [place] - [address] [place] [address] Phone: [phone] Fax: [phone]   Patient reports affordability concerns with their medications: Yes  $4 copay, but patient is on a fixed income and sometimes reports 'running short on the light bill.' Her husband uses a [employer] Card to assist with some utility expenses. Patient expressed interest in receiving financial assistance.  Patient reports access/transportation concerns to their pharmacy: Yes  Reports that she mentioned to PCP that she would like Ozempic  mailed or delivered to her home due to transportation issues.   Patient reports adherence concerns with their medications:  Yes  Due to cost and transportation.   Patient reported that she recently picked up Ozempic  for $4 at [facility] and was able to pay for it. She noted that, because she receives her disability check at the beginning of the month, she had to wait to pay for this and other due medications. It is also worth noting that [facility] is not a preferred pharmacy for her.  I contacted [facility] with Brenda Hicks on the line. [facility], with delivery taking 1-2 days via the postal service. Medicaid covers this service. The patient will need  to create a profile on [email or web address] and set up a payment method online. Once completed, she can either call the pharmacy for assistance or set up prescription delivery directly through the website. The patient stated she would attempt to complete the setup today for all of her medications.      Objective:  Lab Results  Component Value Date   HGBA1C 7.5 (A) 07/27/2023    Lab Results  Component Value Date   CREATININE 1.04 (H) 02/04/2024   BUN 16 02/04/2024   NA 134 (L) 02/04/2024   K 4.2 02/04/2024   CL 100 02/04/2024   CO2 27 02/04/2024    Lab Results  Component Value Date   CHOL 185 12/14/2021   HDL 41 (L) 12/14/2021   LDLCALC 122 (H) 12/14/2021   TRIG 109 12/14/2021   CHOLHDL 4.5 12/14/2021    Medications Reviewed Today     Reviewed by Brenda Hicks, RPH (Pharmacist) on 05/23/24 at 1012  Med List Status: <None>   Medication Order Taking? Sig Documenting Provider Last Dose Status Informant  acetaminophen  (TYLENOL ) 500 MG tablet [id number] Yes Take 2 tablets (1,000 mg total) by mouth every 6 (six) hours as needed. Brenda Ozell LABOR, Hicks  Active   albuterol  (PROVENTIL ) (2.5 MG/3ML) 0.083% nebulizer solution [id number] Yes Take 3 mLs (2.5 mg total) by nebulization every 6 (six) hours as needed for wheezing or shortness of breath. Brenda Hicks  Active   albuterol  (VENTOLIN  HFA) 108 [phone] Base) MCG/ACT inhaler [id number] Yes Inhale 2 puffs into the lungs every 4 (four) hours as needed.  Brenda Hicks  Active   gabapentin  (NEURONTIN ) 100 MG capsule [id number]  Take 100 mg by mouth 3 (three) times daily.  Patient not taking: Reported on 05/23/2024   Provider, Historical, Hicks  Consider Medication Status and Discontinue (Ineffective)   Glucagon  (GVOKE HYPOPEN  1-PACK) 1 MG/0.2ML SOAJ [id number] Yes Inject 1 mg into the skin as needed (hypogylcemia). Brenda Davies, Hicks  Active   glucose blood (ACCU-CHEK GUIDE) test strip [id number] Yes Test blood sugar tid, Dx E11.65 Bernardo Fend, DO  Active   lidocaine  (LIDODERM ) 5 % [id number]  Place 1 patch onto the skin daily for 10 days. Remove & Discard patch within 12 hours or as directed by Hicks  Patient not taking: Reported on 05/23/2024   Brenda Ozell LABOR, Hicks  Active   lisinopril  (ZESTRIL ) 40 MG tablet [id number] Yes Take 40 mg by mouth at bedtime. Provider, Historical, Hicks  Active   Semaglutide ,0.25 or 0.5MG /DOS, (OZEMPIC , 0.25 OR 0.5 MG/DOSE,) 2 MG/3ML SOPN [id number] Yes Inject 0.5 mg into the skin once a week. Bernardo Fend, DO  Active   tiZANidine  (ZANAFLEX ) 4 MG tablet [id number] Yes Take 4 mg by mouth 3 (three) times daily. Provider, Historical, Hicks  Active           - Patient reports that she is still taking lisinopril  but it appears it was discontinued last year after an ED/hospitalization last year    Assessment/Plan:   Patient would like to continue filling medications [facility] using their mail-order services. Will follow up to confirm whether she has completed the delivery setup.  Patient reports financial insecurities. Will collaborate with social worker/case worker to address identified social determinants of health needs, including transportation and food insecurity.  Patient is currently taking lisinopril , which is not listed on her active medication list and does not appear in the fill history portal. Notably, patient's BP is uncontrolled and serum creatinine is slightly elevated on last lab result. Per chat review, lisinopril  was discontinued on discharge last year but later in the year when seen by PCP on 07/27/2023, it was noted patient should be on lisinopril  40 mg. No fill history of lisinopril  this year. Will coordinate with the PCP to clarify and address this discrepancy.  Follow Up Plan: 2 weeks with PharmD (telephone)   Brenda Hicks, PharmD Clinical Pharmacist Cell: [phone]

## 2024-05-23 NOTE — Telephone Encounter (Signed)
 Pharmacy Patient Advocate Encounter  Insurance verification completed.   The patient is insured through [facility] MEDICAID   Ran test claim for Ozempic . Currently a quantity of 3ml is a 28 day supply and the co-pay is $4.00 .  This test claim was processed through [facility]- copay amounts may vary at other pharmacies due to pharmacy/plan contracts, or as the patient moves through the different stages of their insurance plan.

## 2024-05-28 ENCOUNTER — Ambulatory Visit: Payer: MEDICAID | Admitting: Neurosurgery

## 2024-05-28 ENCOUNTER — Telehealth: Payer: Self-pay

## 2024-05-28 DIAGNOSIS — R202 Paresthesia of skin: Secondary | ICD-10-CM

## 2024-05-28 DIAGNOSIS — R200 Anesthesia of skin: Secondary | ICD-10-CM | POA: Diagnosis not present

## 2024-05-28 DIAGNOSIS — E1142 Type 2 diabetes mellitus with diabetic polyneuropathy: Secondary | ICD-10-CM | POA: Diagnosis not present

## 2024-05-28 DIAGNOSIS — Z96659 Presence of unspecified artificial knee joint: Secondary | ICD-10-CM

## 2024-05-28 NOTE — Progress Notes (Signed)
   Telephone encounter was:  Successful.  Complex Care Management Note Care Guide Note  05/28/2024 Name: [name] MRN: [id number] DOB: 11/04/1966  [place] Brenda Hicks is a 58 y.o. year old female who is a primary care patient of Bernardo Fend, DO . The community resource team was consulted for assistance with financial strain  SDOH screenings and interventions completed:  Yes  Social Drivers of Health From This Encounter   Food Insecurity: Food Insecurity Present (05/28/2024)   Hunger Vital Sign    Worried About Running Out of Food in the Last Year: Often true    Ran Out of Food in the Last Year: Often true  Financial Resource Strain: High Risk (05/28/2024)   Overall Financial Resource Strain (CARDIA)    Difficulty of Paying Living Expenses: Very hard  Transportation Needs: Unmet Transportation Needs (05/28/2024)   PRAPARE - Administrator, Civil Service (Medical): Yes    Lack of Transportation (Non-Medical): Yes    SDOH Interventions Today    Flowsheet Row Most Recent Value  SDOH Interventions   Food Insecurity Interventions Community Resources Provided  Transportation Interventions Community Resources Provided  Financial Strain Interventions Community Resources Provided     Care guide performed the following interventions: Patient provided with information about care guide support team and interviewed to confirm resource needs.Pt stated her car broke down and cant get to her appointments and pt has needs for financial assistance with utilities. Pt requested I mail resources to her   Follow Up Plan:  No further follow up planned at this time. The patient has been provided with needed resources.  Encounter Outcome:  Patient Visit Completed    [facility]  [facility] Guide, Phone: [phone] Fax: [phone] Website: [email or web address]

## 2024-05-28 NOTE — Patient Instructions (Signed)

## 2024-05-28 NOTE — Progress Notes (Signed)
 I had a follow-up phone visit today with Brenda Hicks.  She was at home I was in the office.  She gave consent to go forward with a phone visit.  She continues to have severe pain in her left lower extremity.  She has been seen by 2 orthopedic providers, at this point they do not feel that there is a orthopedic intervention that will help with her symptomatology.  We did discuss that in cases of patients with diabetic neuropathy as well as extremity pain that is reminiscent of nerve based issues as sometimes these patients will improve with spinal cord stimulators.  I like to send her for evaluation for spinal cord stimulator trial for our pain team.  I will make that referral.  I discussed with her that patients need to have psychology evaluations prior to this.  I will place a referral to that.  I will also order an MRI of her thoracic spine in preparation for the placement.  That will be necessary in case I need to place a paddle for permanent placement if she does become a candidate for a stimulator.   Spent a total of 10 minutes on the visit today

## 2024-06-06 ENCOUNTER — Other Ambulatory Visit: Payer: MEDICAID

## 2024-06-06 NOTE — Progress Notes (Signed)
   06/06/2024  Patient ID: Brenda Hicks Cancer, female   DOB: January 15, 1966, 58 y.o.   MRN: [id number]   called the patient and was informed that [facility] not [facility] to set up a mail-order account due to her Medicaid plan, despite the local [facility] pharmacy previously telling her she was eligible.  Prior to contacting [facility], I asked the patient if she would be open to using [facility] in the event that [facility] was unable to fulfill mail-order services. She was somewhat open to the idea but expressed a preference to remain with [facility].  I then called [facility] with the patient on the line. The representative informed us  that [facility] is not the pharmacy benefits manager for [facility] and advised us  to contact the insurance provider for further information.  It appears the patient may have accidentally disconnected the call when the [facility] representative ended the call. I attempted to call the patient back but was unable to reach her, and no voicemail option was available. A total of three outreach attempts were made following the disconnection.  I will follow up by sending a MyChart message to ask the patient about her preferred next steps. Additionally, Ozempic  prescription due in a few days and will follow up with blood pressure readings and will out reach about lisinopril .  Upcoming appointment: 08/10/2024  Dorcas Solian, PharmD Clinical Pharmacist Cell: [phone]

## 2024-06-08 ENCOUNTER — Ambulatory Visit
Admission: RE | Admit: 2024-06-08 | Discharge: 2024-06-08 | Disposition: A | Discharge status: Home / Self Care | Payer: MEDICAID | Source: Ambulatory Visit | Attending: Neurosurgery | Admitting: Neurosurgery

## 2024-06-08 DIAGNOSIS — T8484XS Pain due to internal orthopedic prosthetic devices, implants and grafts, sequela: Secondary | ICD-10-CM | POA: Insufficient documentation

## 2024-06-08 DIAGNOSIS — Z96659 Presence of unspecified artificial knee joint: Secondary | ICD-10-CM | POA: Diagnosis present

## 2024-06-08 DIAGNOSIS — R200 Anesthesia of skin: Secondary | ICD-10-CM | POA: Diagnosis present

## 2024-06-08 DIAGNOSIS — R202 Paresthesia of skin: Secondary | ICD-10-CM | POA: Diagnosis present

## 2024-06-12 ENCOUNTER — Ambulatory Visit: Payer: Self-pay | Admitting: Neurosurgery

## 2024-06-18 ENCOUNTER — Encounter: Payer: Self-pay | Admitting: Internal Medicine

## 2024-06-23 ENCOUNTER — Other Ambulatory Visit: Payer: Self-pay

## 2024-06-23 ENCOUNTER — Emergency Department
Admission: EM | Admit: 2024-06-23 | Discharge: 2024-06-23 | Disposition: A | Discharge status: Home / Self Care | Payer: MEDICAID | Attending: Emergency Medicine | Admitting: Emergency Medicine

## 2024-06-23 DIAGNOSIS — E119 Type 2 diabetes mellitus without complications: Secondary | ICD-10-CM | POA: Insufficient documentation

## 2024-06-23 DIAGNOSIS — H9201 Otalgia, right ear: Secondary | ICD-10-CM | POA: Insufficient documentation

## 2024-06-23 MED ORDER — AMOXICILLIN 500 MG PO CAPS
500.0000 mg | ORAL_CAPSULE | Freq: Once | ORAL | Status: AC
  Administered 2024-06-23: 500 mg via ORAL
  Filled 2024-06-23: qty 1

## 2024-06-23 MED ORDER — AMOXICILLIN 500 MG PO CAPS: 1000.0000 mg | ORAL_CAPSULE | Freq: Two times a day (BID) | ORAL | 0 refills | Status: AC

## 2024-06-23 MED ORDER — KETOROLAC TROMETHAMINE 30 MG/ML IJ SOLN
30.0000 mg | Freq: Once | INTRAMUSCULAR | Status: AC
  Administered 2024-06-23: 30 mg via INTRAMUSCULAR
  Filled 2024-06-23: qty 1

## 2024-06-23 NOTE — ED Triage Notes (Signed)
 Pt to ED via POV c/o right ear pain. Started 2 days ago. Endorses headache. Has tried OTC pain meds with no relief. Denies fevers, SOB, CP, dizziness

## 2024-06-23 NOTE — ED Notes (Signed)
 Pt reports having ear since Wednesday. With no alleviation and getting worse.

## 2024-06-23 NOTE — ED Provider Notes (Signed)
 [facility] Provider Note    Event Date/Time   First MD Initiated Contact with Patient 06/23/24 0201     (approximate)   History   Otalgia   HPI  Brenda Hicks is a 58 y.o. female with history of diabetes who presents with 2 days of right ear pain.  She reports it is keeping her up at night.     Physical Exam   Triage Vital Signs: ED Triage Vitals  Encounter Vitals Group     BP 06/23/24 0102 (!) 144/96     Girls Systolic BP Percentile --      Girls Diastolic BP Percentile --      Boys Systolic BP Percentile --      Boys Diastolic BP Percentile --      Pulse Rate 06/23/24 0102 80     Resp 06/23/24 0102 18     Temp 06/23/24 0102 98.3 F (36.8 C)     Temp Source 06/23/24 0102 Oral     SpO2 06/23/24 0102 98 %     Weight 06/23/24 0102 (!) 139.3 kg (307 lb)     Height 06/23/24 0102 1.676 m (5' 6)     Head Circumference --      Peak Flow --      Pain Score 06/23/24 0109 10     Pain Loc --      Pain Education --      Exclude from Growth Chart --     Most recent vital signs: Vitals:   06/23/24 0102 06/23/24 0226  BP: (!) 144/96 (!) 144/90  Pulse: 80 80  Resp: 18 16  Temp: 98.3 F (36.8 C) 98.3 F (36.8 C)  SpO2: 98% 98%     General: Awake, no distress.  CV:  Good peripheral perfusion.  Resp:  Normal effort.  Abd:  No distention.  Other:  Right ear: Patient has discomfort with insertion of the otoscope but no erythema or discharge typical of otitis externa, TM is intact likely effusion noted   ED Results / Procedures / Treatments   Labs (all labs ordered are listed, but only abnormal results are displayed) Labs Reviewed - No data to display   EKG     RADIOLOGY     PROCEDURES:  Critical Care performed:   Procedures   MEDICATIONS ORDERED IN ED: Medications  amoxicillin  (AMOXIL ) capsule 500 mg (500 mg Oral Given 06/23/24 0222)  ketorolac  (TORADOL ) 30 MG/ML injection 30 mg (30 mg Intramuscular Given 06/23/24 0221)      IMPRESSION / MDM / ASSESSMENT AND PLAN / ED COURSE  I reviewed the triage vital signs and the nursing notes. Patient's presentation is most consistent with acute, uncomplicated illness.  Patient with right ear pain as detailed above, effusion noted behind the TM, she is diabetic so although AOM is not common in adults given her discomfort will cover with antibiotics, have her follow-up closely with PCP this week        FINAL CLINICAL IMPRESSION(S) / ED DIAGNOSES   Final diagnoses:  Right ear pain     Rx / DC Orders   ED Discharge Orders          Ordered    amoxicillin  (AMOXIL ) 500 MG capsule  2 times daily        06/23/24 0213             Note:  This document was prepared using Dragon voice recognition software and may include unintentional dictation errors.  Arlander Charleston, MD 06/23/24 [phone]

## 2024-06-24 ENCOUNTER — Encounter: Payer: Self-pay | Admitting: Internal Medicine

## 2024-06-27 ENCOUNTER — Other Ambulatory Visit: Payer: Self-pay | Admitting: Internal Medicine

## 2024-06-27 DIAGNOSIS — M25462 Effusion, left knee: Secondary | ICD-10-CM

## 2024-06-29 ENCOUNTER — Other Ambulatory Visit: Payer: Self-pay | Admitting: Neurosurgery

## 2024-06-29 DIAGNOSIS — E1142 Type 2 diabetes mellitus with diabetic polyneuropathy: Secondary | ICD-10-CM

## 2024-07-08 ENCOUNTER — Encounter: Payer: Self-pay | Admitting: Internal Medicine

## 2024-07-12 ENCOUNTER — Telehealth: Payer: Self-pay | Admitting: Pharmacy Technician

## 2024-07-12 ENCOUNTER — Other Ambulatory Visit: Payer: Self-pay | Admitting: Internal Medicine

## 2024-07-12 ENCOUNTER — Other Ambulatory Visit (HOSPITAL_COMMUNITY): Payer: Self-pay

## 2024-07-12 DIAGNOSIS — Z794 Long term (current) use of insulin: Secondary | ICD-10-CM

## 2024-07-12 MED ORDER — SEMAGLUTIDE (1 MG/DOSE) 4 MG/3ML ~~LOC~~ SOPN: 1.0000 mg | PEN_INJECTOR | SUBCUTANEOUS | 2 refills | Status: DC

## 2024-07-12 NOTE — Telephone Encounter (Signed)
 Pharmacy Patient Advocate Encounter   Received notification from Onbase that prior authorization for Ozempic  (1 MG/DOSE) 4MG /3ML pen-injectors  is required/requested.   Insurance verification completed.   The patient is insured through [employer] .   Per test claim: PA required; PA submitted to above mentioned insurance via Latent Key/confirmation #/EOC [id number] Status is pending

## 2024-07-13 ENCOUNTER — Other Ambulatory Visit (HOSPITAL_COMMUNITY): Payer: Self-pay

## 2024-07-24 ENCOUNTER — Other Ambulatory Visit (HOSPITAL_COMMUNITY): Payer: Self-pay

## 2024-07-25 ENCOUNTER — Other Ambulatory Visit (HOSPITAL_COMMUNITY): Payer: Self-pay

## 2024-07-25 NOTE — Telephone Encounter (Signed)
 Pharmacy Patient Advocate Encounter  Received notification from [facility] that Prior Authorization for Ozempic  (1 MG/DOSE) 4MG /3ML pen-injectors has been APPROVED from 07/24/24 to 07/24/25. Unable to obtain price due to refill too soon rejection, last fill date 07/24/24 next available fill date09/24/25   PA #/Case ID/Reference #: [id number]

## 2024-08-10 ENCOUNTER — Other Ambulatory Visit: Payer: Self-pay

## 2024-08-10 ENCOUNTER — Encounter: Payer: Self-pay | Admitting: Internal Medicine

## 2024-08-10 ENCOUNTER — Ambulatory Visit (INDEPENDENT_AMBULATORY_CARE_PROVIDER_SITE_OTHER): Payer: MEDICAID | Admitting: Internal Medicine

## 2024-08-10 ENCOUNTER — Telehealth: Payer: Self-pay | Admitting: Pharmacy Technician

## 2024-08-10 ENCOUNTER — Other Ambulatory Visit (HOSPITAL_COMMUNITY): Payer: Self-pay

## 2024-08-10 VITALS — BP 142/84 | HR 98 | Temp 97.8°F | Resp 16 | Ht 66.0 in | Wt 321.4 lb

## 2024-08-10 DIAGNOSIS — E1165 Type 2 diabetes mellitus with hyperglycemia: Secondary | ICD-10-CM

## 2024-08-10 DIAGNOSIS — G4700 Insomnia, unspecified: Secondary | ICD-10-CM | POA: Diagnosis not present

## 2024-08-10 DIAGNOSIS — I10 Essential (primary) hypertension: Secondary | ICD-10-CM | POA: Diagnosis not present

## 2024-08-10 DIAGNOSIS — Z794 Long term (current) use of insulin: Secondary | ICD-10-CM

## 2024-08-10 LAB — POCT GLYCOSYLATED HEMOGLOBIN (HGB A1C): Hemoglobin A1C: 8.9 % — AB (ref 4.0–5.6)

## 2024-08-10 MED ORDER — DOXEPIN HCL 3 MG PO TABS: 3.0000 mg | ORAL_TABLET | Freq: Every evening | ORAL | 2 refills | Status: DC | PRN

## 2024-08-10 MED ORDER — LISINOPRIL 40 MG PO TABS
40.0000 mg | ORAL_TABLET | Freq: Every day | ORAL | 1 refills | Status: AC
Start: 2024-08-10 — End: ?

## 2024-08-10 NOTE — Progress Notes (Signed)
 Established Patient Office Visit  Subjective:  Patient ID: Brenda Hicks, female    DOB: October 26, 1966  Age: 58 y.o. MRN: [id number]  CC:  Chief Complaint  Patient presents with   Medical Management of Chronic Issues    3 month recheck    HPI Brenda Hicks presents for follow up on diabetes.    Discussed the use of AI scribe software for clinical note transcription with the patient, who gave verbal consent to proceed.  History of Present Illness Brenda Hicks is a 58 year old female with diabetes and hypertension who presents with medication management issues and elevated blood pressure.  She has ongoing issues with her Ozempic  prescription due to insurance denial, leading to a month without medication. She resumed the 1 mg dose last Saturday. She is frustrated with the recurring issues in obtaining her medication.  Her blood pressure readings have been elevated, with recent measurements as high as 169/109 mmHg and 172 mmHg systolic. She attributes some fluctuations to stress related to her husband's health and caregiving responsibilities. She is currently on lisinopril  40 mg.  She experiences significant sleep disturbances, unable to sleep for four days. Her eyes and head hurt due to lack of sleep. Over-the-counter sleep aids like Nyquil and melatonin have been ineffective. Ambien  was previously effective but discontinued due to dependency concerns. Trazodone  100 mg was initially effective but lost efficacy.  She reports numbness in her left leg, particularly on the lateral side, extending from the knee to the foot, present for almost a year. The sensation is neuropathic, with 'funny' toes and pain in the back of her leg. She feels her concerns are not being adequately addressed.  She has difficulty obtaining new glasses due to insurance issues, affecting her ability to see clearly, particularly when watching television.   Hypertension, OSA: -Medications: Currently on Lisinopril   40 mg  -Patient is compliant with medications and reports no side effects.  -Checking BP at home: higher than usual, more stress and not slepeing -Denies any SOB, CP, LE edema, medication SEs, or symptoms of hypotension   Diabetes, Type 2 -Last A1c 7.5% 9/24 -Medications: Now only on Ozempic  1 mg, had an issue with availability but has it now -Diet: appetite decreased, working on eating healthy foods -Exercise: None right now due to knee pain -Blood sugars: no more low's -Eye exam: Due -Foot exam: Due -Microalbumin: Due -Statin: yes -PNA vaccine: Prevnar 23 in 2014  Health Maintenance: -Blood work UTD -Mammogram 3/25 Birads-1  Past Medical History:  Diagnosis Date   Anemia    vitamin d deficiency   Anxiety    Arthritis    Asthma    WELL CONTROLLED   Cancer of ear    skin cancer left ear   COPD (chronic obstructive pulmonary disease) (HCC)    Depression    Diabetes mellitus without complication (HCC)    Fatty liver    Hypertension    Kidney cysts    per patient, never had   Renal disorder    Sleep apnea    DOES NOT USE CPAP. waiting for new machine and a new sleep study   Stroke [facility]) May or June 2019   TIA. no residual symptoms    Past Surgical History:  Procedure Laterality Date   ABDOMINAL HYSTERECTOMY     ANTERIOR CERVICAL DECOMP/DISCECTOMY FUSION N/A 08/09/2018   Procedure: ANTERIOR CERVICAL DECOMPRESSION/DISCECTOMY FUSION 1 LEVEL- C4-5;  Surgeon: Clois Fret, MD;  Location: [facility] ORS;  Service: Neurosurgery;  Laterality: N/A;   BACK SURGERY     NECK   COLONOSCOPY WITH PROPOFOL  N/A 02/26/2021   Procedure: COLONOSCOPY WITH PROPOFOL ;  Surgeon: Therisa Bi, MD;  Location: [facility] ENDOSCOPY;  Service: Endoscopy;  Laterality: N/A;   DILATATION & CURETTAGE/HYSTEROSCOPY WITH MYOSURE N/A 01/19/2021   Procedure: DILATATION & CURETTAGE/HYSTEROSCOPY;  Surgeon: Connell Davies, MD;  Location: [facility] ORS;  Service: Gynecology;  Laterality: N/A;   DILATION AND CURETTAGE  OF UTERUS     ENDOMETRIAL ABLATION N/A 01/19/2021   Procedure: ENDOMETRIAL ABLATION, MINERVA;  Surgeon: Connell Davies, MD;  Location: [facility] ORS;  Service: Gynecology;  Laterality: N/A;   ENDOMETRIAL BIOPSY     benign   ESOPHAGOGASTRODUODENOSCOPY (EGD) WITH PROPOFOL  N/A 08/24/2022   Procedure: ESOPHAGOGASTRODUODENOSCOPY (EGD) WITH PROPOFOL ;  Surgeon: Unk Corinn Skiff, MD;  Location: [facility] ENDOSCOPY;  Service: Gastroenterology;  Laterality: N/A;   EXPLORATORY LAPAROTOMY  1992   REMOVAL OF RUPTURED ECTOPIC   HAND SURGERY Right 1998   cyst removed   HERNIA REPAIR  1975   UMBILICAL   JOINT REPLACEMENT Right 2014   TKR   KNEE ARTHROSCOPY Right 2012   KNEE SURGERY Right 2014   total knee replacement   ROBOTIC ASSISTED LAPAROSCOPIC HYSTERECTOMY AND SALPINGECTOMY Bilateral 03/15/2022   Procedure: XI ROBOTIC ASSISTED LAPAROSCOPIC HYSTERECTOMY;  Surgeon: Connell Davies, MD;  Location: [facility] ORS;  Service: Gynecology;  Laterality: Bilateral;   SHOULDER ARTHROSCOPY WITH BICEPSTENOTOMY Left 12/14/2016   Procedure: SHOULDER ARTHROSCOPY WITH BICEPSTENOTOMY;  Surgeon: Norleen JINNY Maltos, MD;  Location: [facility];  Service: Orthopedics;  Laterality: Left;   SHOULDER ARTHROSCOPY WITH OPEN ROTATOR CUFF REPAIR Left 12/14/2016   Procedure: SHOULDER ARTHROSCOPY WITH OPEN ROTATOR CUFF REPAIR AND ARTHROSCOPIC ROTATOR CUFF REPAIR;  Surgeon: Norleen JINNY Maltos, MD;  Location: [facility];  Service: Orthopedics;  Laterality: Left;   SHOULDER ARTHROSCOPY WITH ROTATOR CUFF REPAIR Right 01/04/2019   Procedure: SHOULDER ARTHROSCOPY WITH ROTATOR CUFF REPAIR;  Surgeon: Maltos Norleen JINNY, MD;  Location: [facility];  Service: Orthopedics;  Laterality: Right;   SHOULDER ARTHROSCOPY WITH SUBACROMIAL DECOMPRESSION Left 12/14/2016   Procedure: SHOULDER ARTHROSCOPY WITH SUBACROMIAL DECOMPRESSION;  Surgeon: Norleen JINNY Maltos, MD;  Location: [facility];  Service: Orthopedics;  Laterality: Left;   SHOULDER ARTHROSCOPY WITH SUBACROMIAL DECOMPRESSION AND BICEP  TENDON REPAIR Right 01/04/2019   Procedure: SHOULDER ARTHROSCOPY WITH DEBRIDEMENT AND SUBACROMIAL DECOMPRESSION-RIGHT;  Surgeon: Maltos Norleen JINNY, MD;  Location: [facility];  Service: Orthopedics;  Laterality: Right;   TOTAL KNEE ARTHROPLASTY Left 04/26/2023   Procedure: TOTAL KNEE ARTHROPLASTY;  Surgeon: Maltos Norleen JINNY, MD;  Location: [facility];  Service: Orthopedics;  Laterality: Left;   TUBAL LIGATION      Family History  Problem Relation Age of Onset   Hypertension Mother    Thyroid  disease Mother    Lupus Mother    Congestive Heart Failure Mother    Hypertension Brother    CAD Maternal Grandmother    Breast cancer Neg Hx    Ovarian cancer Neg Hx    Colon cancer Neg Hx     Social History   Socioeconomic History   Marital status: Married    Spouse name: brian   Number of children: 7   Years of education: Not on file   Highest education level: 11th grade  Occupational History   Occupation: disabled    Comment: disabled  Tobacco Use   Smoking status: Former    Current packs/day: 0.00    Average packs/day: 2.0 packs/day for 25.0 years (50.0 ttl pk-yrs)  Types: Cigarettes    Start date: 06/09/1992    Quit date: 06/09/2017    Years since quitting: 7.1   Smokeless tobacco: Never  Vaping Use   Vaping status: Never Used  Substance and Sexual Activity   Alcohol use: No   Drug use: Yes    Frequency: 7.0 times per week    Types: Marijuana    Comment: pt states as needed when she is hurting real bad   Sexual activity: Yes    Partners: Male    Birth control/protection: None  Other Topics Concern   Not on file  Social History Narrative   Have 6 biological childrean and custody of niece since 25 months.   Social Drivers of Corporate investment banker Strain: Low Risk  (07/03/2024)   Received from [facility]   Overall Financial Resource Strain (CARDIA)    Difficulty of Paying Living Expenses: Not very hard  Recent Concern: Financial Resource Strain - High  Risk (05/28/2024)   Overall Financial Resource Strain (CARDIA)    Difficulty of Paying Living Expenses: Very hard  Food Insecurity: Food Insecurity Present (07/03/2024)   Received from [facility]   Hunger Vital Sign    Within the past 12 months, you worried that your food would run out before you got the money to buy more.: Sometimes true    Within the past 12 months, the food you bought just didn't last and you didn't have money to get more.: Sometimes true  Transportation Needs: Unmet Transportation Needs (07/03/2024)   Received from [facility] - Transportation    In the past 12 months, has lack of transportation kept you from medical appointments or from getting medications?: Yes    Lack of Transportation (Non-Medical): Yes  Physical Activity: Unknown (02/16/2023)   Exercise Vital Sign    Days of Exercise per Week: Patient declined    Minutes of Exercise per Session: Not on file  Stress: No Stress Concern Present (02/16/2023)   [employer] of Occupational Health - Occupational Stress Questionnaire    Feeling of Stress : Only a little  Social Connections: Moderately Isolated (02/16/2023)   Social Connection and Isolation Panel    Frequency of Communication with Friends and Family: Once a week    Frequency of Social Gatherings with Friends and Family: Never    Attends Religious Services: More than 4 times per year    Active Member of [employer] or Organizations: No    Attends Engineer, structural: Not on file    Marital Status: Married  Catering manager Violence: Not At Risk (04/26/2023)   Humiliation, Afraid, Rape, and Kick questionnaire    Fear of Current or Ex-Partner: No    Emotionally Abused: No    Physically Abused: No    Sexually Abused: No    Outpatient Medications Prior to Visit  Medication Sig Dispense Refill   acetaminophen  (TYLENOL ) 500 MG tablet Take 2 tablets (1,000 mg total) by mouth every 6 (six) hours as needed.  100 tablet 2   albuterol  (PROVENTIL ) (2.5 MG/3ML) 0.083% nebulizer solution Take 3 mLs (2.5 mg total) by nebulization every 6 (six) hours as needed for wheezing or shortness of breath. 75 mL 1   albuterol  (VENTOLIN  HFA) 108 (90 Base) MCG/ACT inhaler Inhale 2 puffs into the lungs every 4 (four) hours as needed. 8 g 0   Glucagon  (GVOKE HYPOPEN  1-PACK) 1 MG/0.2ML SOAJ Inject 1 mg into the skin as needed (  hypogylcemia).     lisinopril  (ZESTRIL ) 40 MG tablet Take 40 mg by mouth at bedtime.     Semaglutide , 1 MG/DOSE, 4 MG/3ML SOPN Inject 1 mg as directed once a week. 3 mL 2   tiZANidine  (ZANAFLEX ) 4 MG tablet Take 4 mg by mouth 3 (three) times daily.     gabapentin  (NEURONTIN ) 100 MG capsule Take 100 mg by mouth 3 (three) times daily. (Patient not taking: Reported on 05/23/2024)     glucose blood (ACCU-CHEK GUIDE) test strip Test blood sugar tid, Dx E11.65 100 each 12   No facility-administered medications prior to visit.    Allergies  Allergen Reactions   Dilaudid  [Hydromorphone  Hcl] Nausea And Vomiting   Cymbalta  [Duloxetine  Hcl] Nausea And Vomiting   Morphine  And Codeine Nausea And Vomiting   Tramadol  Nausea Only    If she takes antinausea medicine with this medicine, then she can tolerate it.    ROS Review of Systems  Psychiatric/Behavioral:  Positive for sleep disturbance.   All other systems reviewed and are negative.     Objective:    Physical Exam Constitutional:      Appearance: Normal appearance. She is obese.     Comments: Walking with walker  HENT:     Head: Normocephalic and atraumatic.  Eyes:     Conjunctiva/sclera: Conjunctivae normal.  Cardiovascular:     Rate and Rhythm: Normal rate and regular rhythm.     Pulses:          Dorsalis pedis pulses are 2+ on the right side and 2+ on the left side.  Pulmonary:     Effort: Pulmonary effort is normal.     Breath sounds: Normal breath sounds.  Musculoskeletal:     Right foot: Normal range of motion. No deformity,  bunion, Charcot foot, foot drop or prominent metatarsal heads.     Left foot: Normal range of motion. No deformity, bunion, Charcot foot, foot drop or prominent metatarsal heads.  Feet:     Right foot:     Protective Sensation: 6 sites tested.  6 sites sensed.     Skin integrity: Skin integrity normal.     Toenail Condition: Right toenails are normal.     Left foot:     Protective Sensation: 6 sites tested.  4 sites sensed.     Skin integrity: Skin integrity normal.     Toenail Condition: Left toenails are normal.  Skin:    General: Skin is warm and dry.  Neurological:     General: No focal deficit present.     Mental Status: She is alert. Mental status is at baseline.  Psychiatric:        Mood and Affect: Mood normal.        Behavior: Behavior normal.     BP (!) 142/84 (Cuff Size: Large)   Pulse 98   Temp 97.8 F (36.6 C) (Oral)   Resp 16   Ht 5' 6 (1.676 m)   Wt (!) 321 lb 6.4 oz (145.8 kg)   LMP 02/22/2022 (Approximate)   SpO2 98%   BMI 51.88 kg/m  Wt Readings from Last 3 Encounters:  08/10/24 (!) 321 lb 6.4 oz (145.8 kg)  06/23/24 (!) 307 lb (139.3 kg)  05/17/24 (!) 321 lb (145.6 kg)     Health Maintenance Due  Topic Date Due   Hepatitis B Vaccines 19-59 Average Risk (1 of 3 - 19+ 3-dose series) Never done   Pneumococcal Vaccine: 50+ Years (2 of 2 -  PCV) 09/12/2014   Zoster Vaccines- Shingrix (1 of 2) Never done   Lung Cancer Screening  01/29/2023   OPHTHALMOLOGY EXAM  03/05/2023   FOOT EXAM  07/28/2023   Diabetic kidney evaluation - Urine ACR  10/27/2023   Influenza Vaccine  06/22/2024   COVID-19 Vaccine (6 - 2025-26 season) 07/23/2024       Topic Date Due   Hepatitis B Vaccines 19-59 Average Risk (1 of 3 - 19+ 3-dose series) Never done    Lab Results  Component Value Date   TSH 1.614 03/15/2022   Lab Results  Component Value Date   WBC 5.9 02/04/2024   HGB 14.4 02/04/2024   HCT 44.9 02/04/2024   MCV 94.7 02/04/2024   PLT 264 02/04/2024    Lab Results  Component Value Date   NA 134 (L) 02/04/2024   K 4.2 02/04/2024   CO2 27 02/04/2024   GLUCOSE 224 (H) 02/04/2024   BUN 16 02/04/2024   CREATININE 1.04 (H) 02/04/2024   BILITOT 0.3 04/21/2023   ALKPHOS 74 04/21/2023   AST 19 04/21/2023   ALT 18 04/21/2023   PROT 7.6 04/21/2023   ALBUMIN  3.8 04/21/2023   CALCIUM  8.8 (L) 02/04/2024   ANIONGAP 7 02/04/2024   EGFR 79 01/18/2022   Lab Results  Component Value Date   CHOL 185 12/14/2021   Lab Results  Component Value Date   HDL 41 (L) 12/14/2021   Lab Results  Component Value Date   LDLCALC 122 (H) 12/14/2021   Lab Results  Component Value Date   TRIG 109 12/14/2021   Lab Results  Component Value Date   CHOLHDL 4.5 12/14/2021   Lab Results  Component Value Date   HGBA1C 8.9 (A) 08/10/2024      Assessment & Plan:   Assessment & Plan Type 2 diabetes mellitus with hyperglycemia Hyperglycemia due to insurance-related Ozempic  interruptions. Currently on 1 mg Ozempic , well-tolerated. A1c monitoring planned for control assessment. - Continue Ozempic  1 mg as tolerated. - Refer to in-office pharmacist, for medication procurement assistance. - Perform urine test for kidney function. - Conduct foot exam for neuropathy screening. - Recheck A1c in three months.  Hypertension Blood pressure elevated at 142/84 mmHg, with fluctuations. Stress from caregiving may contribute. On lisinopril  40 mg. - Refill lisinopril  40 mg. - Monitor blood pressure regularly. - Address stress and sleep issues impacting blood pressure.  Insomnia Severe insomnia with no sleep for four days. Ambien  effective but discontinued. Trazodone  100 mg ineffective. Considering doxepin . - Prescribe doxepin  for sleep, to be taken at bedtime as needed. - Provide three-month supply of doxepin .   - POCT HgB A1C - AMB Referral VBCI Care Management - HM Diabetes Foot Exam - Urine Microalbumin w/creat. ratio - lisinopril  (ZESTRIL ) 40 MG  tablet; Take 1 tablet (40 mg total) by mouth at bedtime.  Dispense: 90 tablet; Refill: 1 - Doxepin  HCl 3 MG TABS; Take 1 tablet (3 mg total) by mouth at bedtime as needed.  Dispense: 30 tablet; Refill: 2    Follow-up: Return in about 3 months (around 11/09/2024) for follow up on a1c.    Sharyle Fischer, DO

## 2024-08-10 NOTE — Telephone Encounter (Signed)
 Preferred listed

## 2024-08-10 NOTE — Telephone Encounter (Signed)
 Pharmacy Patient Advocate Encounter   Received notification from CoverMyMeds that prior authorization for Doxepin  HCl 3MG  tablets is required/requested.   Insurance verification completed.   The patient is insured through [employer] .   Per test claim:  Please see below what is preferred by the insurance.  If suggested medication is appropriate, Please send in a new RX and discontinue this one. If not, please advise as to why it's not appropriate so that we may request a Prior Authorization. Please note, some preferred medications may still require a PA.  If the suggested medications have not been trialed and there are no contraindications to their use, the PA will not be submitted, as it will not be approved.

## 2024-08-11 LAB — MICROALBUMIN / CREATININE URINE RATIO
Creatinine, Urine: 185 mg/dL (ref 20–275)
Microalb Creat Ratio: 4 mg/g{creat} (ref <30)
Microalb, Ur: 0.8 mg/dL

## 2024-08-13 ENCOUNTER — Other Ambulatory Visit (HOSPITAL_COMMUNITY): Payer: Self-pay

## 2024-08-13 ENCOUNTER — Ambulatory Visit: Payer: Self-pay | Admitting: Internal Medicine

## 2024-08-13 NOTE — Telephone Encounter (Signed)
 Patient notified. Is there anything else she can try

## 2024-08-14 ENCOUNTER — Other Ambulatory Visit: Payer: Self-pay | Admitting: Internal Medicine

## 2024-08-14 ENCOUNTER — Telehealth: Payer: Self-pay

## 2024-08-14 ENCOUNTER — Telehealth: Payer: Self-pay | Admitting: Emergency Medicine

## 2024-08-14 DIAGNOSIS — G4700 Insomnia, unspecified: Secondary | ICD-10-CM

## 2024-08-14 MED ORDER — TRAZODONE HCL 50 MG PO TABS: 25.0000 mg | ORAL_TABLET | Freq: Every evening | ORAL | 3 refills | Status: AC | PRN

## 2024-08-14 NOTE — Progress Notes (Signed)
 Complex Care Management Note Care Guide Note  08/14/2024 Name: Brenda Hicks MRN: [id number] DOB: 1966/10/06   Complex Care Management Outreach Attempts: An unsuccessful telephone outreach was attempted today to offer the patient information about available complex care management services.  Follow Up Plan:  Additional outreach attempts will be made to offer the patient complex care management information and services.   Encounter Outcome:  No Answer  [facility]  [facility], [facility] VBCI Assistant Direct Dial: [phone]  Fax: [phone]

## 2024-08-14 NOTE — Telephone Encounter (Signed)
 Tried Melantonin and it do not work. Please send in Trazodone  to try. If this do not work she will do a sleep study

## 2024-08-14 NOTE — Telephone Encounter (Signed)
 Called patient mail box full.

## 2024-08-14 NOTE — Telephone Encounter (Signed)
 Copied from CRM #[id number]. Topic: General - Other >> Aug 14, 2024 12:53 PM Carlatta H wrote: Reason for CRM: Patient received a call from Jerona Fees, RMA//No notes are in the chart//Please callback

## 2024-08-14 NOTE — Telephone Encounter (Signed)
 Patient notified

## 2024-08-17 NOTE — Progress Notes (Signed)
 Complex Care Management Note Care Guide Note  08/17/2024 Name: Brenda Hicks MRN: [id number] DOB: 01-26-1966   Complex Care Management Outreach Attempts: A second unsuccessful outreach was attempted today to offer the patient with information about available complex care management services.  Follow Up Plan:  Additional outreach attempts will be made to offer the patient complex care management information and services.   Encounter Outcome:  No Answer  [facility]  [facility], [facility] VBCI Assistant Direct Dial: [phone]  Fax: [phone]

## 2024-08-20 NOTE — Progress Notes (Signed)
 Complex Care Management Note Care Guide Note  08/20/2024 Name: Brenda Hicks MRN: [id number] DOB: June 25, 1966   Complex Care Management Outreach Attempts: A third unsuccessful outreach was attempted today to offer the patient with information about available complex care management services.  Follow Up Plan:  No further outreach attempts will be made at this time. We have been unable to contact the patient to offer or enroll patient in complex care management services.  Encounter Outcome:  No Answer  [facility]  [facility], [facility] VBCI Assistant Direct Dial: [phone]  Fax: [phone]

## 2024-08-22 ENCOUNTER — Other Ambulatory Visit (HOSPITAL_COMMUNITY): Payer: Self-pay

## 2024-09-13 ENCOUNTER — Encounter: Payer: Self-pay | Admitting: Internal Medicine

## 2024-09-13 DIAGNOSIS — H538 Other visual disturbances: Secondary | ICD-10-CM

## 2024-09-13 DIAGNOSIS — E1165 Type 2 diabetes mellitus with hyperglycemia: Secondary | ICD-10-CM

## 2024-09-14 ENCOUNTER — Other Ambulatory Visit: Payer: Self-pay

## 2024-09-14 ENCOUNTER — Emergency Department
Admission: EM | Admit: 2024-09-14 | Discharge: 2024-09-15 | Disposition: A | Discharge status: Home / Self Care | Payer: MEDICAID | Attending: Emergency Medicine | Admitting: Emergency Medicine

## 2024-09-14 DIAGNOSIS — E119 Type 2 diabetes mellitus without complications: Secondary | ICD-10-CM | POA: Insufficient documentation

## 2024-09-14 DIAGNOSIS — R1032 Left lower quadrant pain: Secondary | ICD-10-CM | POA: Diagnosis present

## 2024-09-14 DIAGNOSIS — E876 Hypokalemia: Secondary | ICD-10-CM | POA: Diagnosis not present

## 2024-09-14 DIAGNOSIS — J449 Chronic obstructive pulmonary disease, unspecified: Secondary | ICD-10-CM | POA: Insufficient documentation

## 2024-09-14 DIAGNOSIS — Z85828 Personal history of other malignant neoplasm of skin: Secondary | ICD-10-CM | POA: Diagnosis not present

## 2024-09-14 DIAGNOSIS — I10 Essential (primary) hypertension: Secondary | ICD-10-CM | POA: Insufficient documentation

## 2024-09-14 DIAGNOSIS — K5732 Diverticulitis of large intestine without perforation or abscess without bleeding: Secondary | ICD-10-CM | POA: Insufficient documentation

## 2024-09-14 LAB — URINALYSIS, ROUTINE W REFLEX MICROSCOPIC
Bilirubin Urine: NEGATIVE
Glucose, UA: NEGATIVE mg/dL
Hgb urine dipstick: NEGATIVE
Ketones, ur: NEGATIVE mg/dL
Leukocytes,Ua: NEGATIVE
Nitrite: NEGATIVE
Protein, ur: NEGATIVE mg/dL
Specific Gravity, Urine: 1.023 (ref 1.005–1.030)
pH: 5 (ref 5.0–8.0)

## 2024-09-14 LAB — LIPASE, BLOOD: Lipase: 34 U/L (ref 11–51)

## 2024-09-14 LAB — CBC
HCT: 44 % (ref 36.0–46.0)
Hemoglobin: 13.7 g/dL (ref 12.0–15.0)
MCH: 29.3 pg (ref 26.0–34.0)
MCHC: 31.1 g/dL (ref 30.0–36.0)
MCV: 94 fL (ref 80.0–100.0)
Platelets: 247 K/uL (ref 150–400)
RBC: 4.68 MIL/uL (ref 3.87–5.11)
RDW: 13.2 % (ref 11.5–15.5)
WBC: 8.4 K/uL (ref 4.0–10.5)
nRBC: 0 % (ref 0.0–0.2)

## 2024-09-14 MED ORDER — KETOROLAC TROMETHAMINE 15 MG/ML IJ SOLN
15.0000 mg | Freq: Once | INTRAMUSCULAR | Status: AC
  Administered 2024-09-14: 15 mg via INTRAVENOUS
  Filled 2024-09-14: qty 1

## 2024-09-14 MED ORDER — SODIUM CHLORIDE 0.9 % IV BOLUS
1000.0000 mL | Freq: Once | INTRAVENOUS | Status: AC
  Administered 2024-09-14: 1000 mL via INTRAVENOUS

## 2024-09-14 MED ORDER — ONDANSETRON HCL 4 MG/2ML IJ SOLN
4.0000 mg | Freq: Once | INTRAMUSCULAR | Status: AC
  Administered 2024-09-14: 4 mg via INTRAVENOUS
  Filled 2024-09-14: qty 2

## 2024-09-14 NOTE — ED Triage Notes (Signed)
 BIBEMS, coming from home. C/o LLQ ABD cramping and weakness. Pain radiates from ABD to L leg. Pt took aleeve without relief. C/o HA, blurred vision intermittent for x1 year. SBP 190s, takes BP meds as prescribed. GCS 15

## 2024-09-14 NOTE — ED Provider Notes (Signed)
 [facility] Provider Note    Event Date/Time   First MD Initiated Contact with Patient 09/14/24 2311     (approximate)   History   Chief Complaint: Abdominal Pain   HPI  Brenda Hicks is a 58 y.o. female with a history of hypertension diabetes prior stroke COPD morbid obesity who comes ED complaining of left lower quadrant abdominal pain, gradually worsening for the past 4 days.  Has nausea but no vomiting, no constipation or diarrhea.  No fever.        Past Medical History:  Diagnosis Date   Anemia    vitamin d deficiency   Anxiety    Arthritis    Asthma    WELL CONTROLLED   Cancer of ear    skin cancer left ear   COPD (chronic obstructive pulmonary disease) (HCC)    Depression    Diabetes mellitus without complication (HCC)    Fatty liver    Hypertension    Kidney cysts    per patient, never had   Renal disorder    Sleep apnea    DOES NOT USE CPAP. waiting for new machine and a new sleep study   Stroke [facility]) May or June 2019   TIA. no residual symptoms    Current Outpatient Rx   Order #: [id number] Class: Normal   Order #: [id number] Class: Normal   Order #: [id number] Class: Normal   Order #: [id number] Class: Normal   Order #: [id number] Class: Normal   Order #: [id number] Class: Normal   Order #: [id number] Class: Normal   Order #: [id number] Class: Normal   Order #: [id number] Class: Normal   Order #: [id number] Class: No Print   Order #: [id number] Class: Normal   Order #: [id number] Class: Normal   Order #: [id number] Class: Normal   Order #: [id number] Class: Historical Med   Order #: [id number] Class: Normal    Past Surgical History:  Procedure Laterality Date   ABDOMINAL HYSTERECTOMY     ANTERIOR CERVICAL DECOMP/DISCECTOMY FUSION N/A 08/09/2018   Procedure: ANTERIOR CERVICAL DECOMPRESSION/DISCECTOMY FUSION 1 LEVEL- C4-5;  Surgeon: Clois Fret, MD;  Location: [facility] ORS;  Service: Neurosurgery;  Laterality: N/A;   BACK SURGERY      NECK   COLONOSCOPY WITH PROPOFOL  N/A 02/26/2021   Procedure: COLONOSCOPY WITH PROPOFOL ;  Surgeon: Therisa Bi, MD;  Location: [facility] ENDOSCOPY;  Service: Endoscopy;  Laterality: N/A;   DILATATION & CURETTAGE/HYSTEROSCOPY WITH MYOSURE N/A 01/19/2021   Procedure: DILATATION & CURETTAGE/HYSTEROSCOPY;  Surgeon: Connell Davies, MD;  Location: [facility] ORS;  Service: Gynecology;  Laterality: N/A;   DILATION AND CURETTAGE OF UTERUS     ENDOMETRIAL ABLATION N/A 01/19/2021   Procedure: ENDOMETRIAL ABLATION, MINERVA;  Surgeon: Connell Davies, MD;  Location: [facility] ORS;  Service: Gynecology;  Laterality: N/A;   ENDOMETRIAL BIOPSY     benign   ESOPHAGOGASTRODUODENOSCOPY (EGD) WITH PROPOFOL  N/A 08/24/2022   Procedure: ESOPHAGOGASTRODUODENOSCOPY (EGD) WITH PROPOFOL ;  Surgeon: Unk Corinn Skiff, MD;  Location: [facility] ENDOSCOPY;  Service: Gastroenterology;  Laterality: N/A;   EXPLORATORY LAPAROTOMY  1992   REMOVAL OF RUPTURED ECTOPIC   HAND SURGERY Right 1998   cyst removed   HERNIA REPAIR  1975   UMBILICAL   JOINT REPLACEMENT Right 2014   TKR   KNEE ARTHROSCOPY Right 2012   KNEE SURGERY Right 2014   total knee replacement   ROBOTIC ASSISTED LAPAROSCOPIC HYSTERECTOMY AND SALPINGECTOMY Bilateral 03/15/2022   Procedure: XI ROBOTIC ASSISTED LAPAROSCOPIC HYSTERECTOMY;  Surgeon: Connell Davies, MD;  Location: [facility] ORS;  Service: Gynecology;  Laterality: Bilateral;  SHOULDER ARTHROSCOPY WITH BICEPSTENOTOMY Left 12/14/2016   Procedure: SHOULDER ARTHROSCOPY WITH BICEPSTENOTOMY;  Surgeon: Norleen JINNY Maltos, MD;  Location: [facility] OR[facility];  Service: Orthopedics;  Laterality: Left;   SHOULDER ARTHROSCOPY WITH OPEN ROTATOR CUFF REPAIR Left 12/14/2016   Procedure: SHOULDER ARTHROSCOPY WITH OPEN ROTATOR CUFF REPAIR AND ARTHROSCOPIC ROTATOR CUFF REPAIR;  Surgeon: Norleen JINNY Maltos, MD;  Location: [facility] OR[facility];  Service: Orthopedics;  Laterality: Left;   SHOULDER ARTHROSCOPY WITH ROTATOR CUFF REPAIR Right 01/04/2019   Procedure: SHOULDER ARTHROSCOPY  WITH ROTATOR CUFF REPAIR;  Surgeon: Maltos Norleen JINNY, MD;  Location: [facility] OR[facility];  Service: Orthopedics;  Laterality: Right;   SHOULDER ARTHROSCOPY WITH SUBACROMIAL DECOMPRESSION Left 12/14/2016   Procedure: SHOULDER ARTHROSCOPY WITH SUBACROMIAL DECOMPRESSION;  Surgeon: Norleen JINNY Maltos, MD;  Location: [facility] OR[facility];  Service: Orthopedics;  Laterality: Left;   SHOULDER ARTHROSCOPY WITH SUBACROMIAL DECOMPRESSION AND BICEP TENDON REPAIR Right 01/04/2019   Procedure: SHOULDER ARTHROSCOPY WITH DEBRIDEMENT AND SUBACROMIAL DECOMPRESSION-RIGHT;  Surgeon: Maltos Norleen JINNY, MD;  Location: [facility] OR[facility];  Service: Orthopedics;  Laterality: Right;   TOTAL KNEE ARTHROPLASTY Left 04/26/2023   Procedure: TOTAL KNEE ARTHROPLASTY;  Surgeon: Maltos Norleen JINNY, MD;  Location: [facility] OR[facility];  Service: Orthopedics;  Laterality: Left;   TUBAL LIGATION      Physical Exam   Triage Vital Signs: ED Triage Vitals [09/14/24 1904]  Encounter Vitals Group     BP (!) 178/98     Girls Systolic BP Percentile      Girls Diastolic BP Percentile      Boys Systolic BP Percentile      Boys Diastolic BP Percentile      Pulse Rate 98     Resp 18     Temp 97.9 F (36.6 C)     Temp Source Oral     SpO2 100 %     Weight 300 lb (136.1 kg)     Height 5' 6 (1.676 m)     Head Circumference      Peak Flow      Pain Score 10     Pain Loc      Pain Education      Exclude from Growth Chart     Most recent vital signs: Vitals:   09/15/24 0200 09/15/24 0230  BP: 122/73 136/79  Pulse: 92 (!) 102  Resp:    Temp:    SpO2: 95% 100%    General: Awake, no distress.  CV:  Good peripheral perfusion.  Tachycardia heart rate 100 Resp:  Normal effort.  Clear lungs Abd:  No distention.  Soft with diffuse lower abdominal tenderness, worse in left lower quadrant Other:  Moist oral mucosa   ED Results / Procedures / Treatments   Labs (all labs ordered are listed, but only abnormal results are displayed) Labs Reviewed  URINALYSIS, ROUTINE W REFLEX  MICROSCOPIC - Abnormal; Notable for the following components:      Result Value   Color, Urine YELLOW (*)    APPearance CLEAR (*)    Bacteria, UA RARE (*)    All other components within normal limits  COMPREHENSIVE METABOLIC PANEL WITH GFR - Abnormal; Notable for the following components:   Potassium 3.2 (*)    Glucose, Bld 134 (*)    Calcium  8.2 (*)    Albumin  3.3 (*)    AST 12 (*)    All other components within normal limits  CBC  LIPASE, BLOOD     EKG    RADIOLOGY CT interpreted by me, no obstruction  or free air.  Radiology report reviewed   PROCEDURES:  Procedures   MEDICATIONS ORDERED IN ED: Medications  oxyCODONE -acetaminophen  (PERCOCET/ROXICET) 5-325 MG per tablet 1 tablet (has no administration in time range)  ondansetron  (ZOFRAN ) injection 4 mg (4 mg Intravenous Given 09/14/24 2338)  sodium chloride  0.9 % bolus 1,000 mL (0 mLs Intravenous Stopped 09/15/24 0200)  ketorolac  (TORADOL ) 15 MG/ML injection 15 mg (15 mg Intravenous Given 09/14/24 2338)  iohexol  (OMNIPAQUE ) 350 MG/ML injection 125 mL (125 mLs Intravenous Contrast Given 09/15/24 0211)     IMPRESSION / MDM / ASSESSMENT AND PLAN / ED COURSE  I reviewed the triage vital signs and the nursing notes.  DDx: Cystitis, appendicitis, diverticulitis, ovarian mass, viral illness, constipation  Patient's presentation is most consistent with acute presentation with potential threat to life or bodily function.  Patient presents with lower abdominal pain and tenderness.  Multiple comorbidities.  Urinalysis normal.  Will obtain labs and CT.   ----------------------------------------- 2:42 AM on 09/15/2024 ----------------------------------------- CT shows uncomplicated sigmoid diverticulitis.  Feeling better.  Labs reassuring.  Stable for discharge with supportive care, will start Cipro  Flagyl  given her comorbidities.      FINAL CLINICAL IMPRESSION(S) / ED DIAGNOSES   Final diagnoses:  Sigmoid  diverticulitis  Morbid obesity (HCC)     Rx / DC Orders   ED Discharge Orders          Ordered    oxyCODONE -acetaminophen  (PERCOCET) 5-325 MG tablet  Every 6 hours PRN        09/15/24 0241    ondansetron  (ZOFRAN -ODT) 4 MG disintegrating tablet  Every 8 hours PRN        09/15/24 0241    dicyclomine (BENTYL) 20 MG tablet  Every 6 hours PRN        09/15/24 0241    naproxen  (NAPROSYN ) 500 MG tablet  2 times daily with meals        09/15/24 0241    ciprofloxacin  (CIPRO ) 500 MG tablet  2 times daily        09/15/24 0243    metroNIDAZOLE  (FLAGYL ) 500 MG tablet  2 times daily        09/15/24 0243             Note:  This document was prepared using Dragon voice recognition software and may include unintentional dictation errors.   Viviann Pastor, MD 09/15/24 [phone]

## 2024-09-14 NOTE — ED Notes (Signed)
Lab called need light green re collect

## 2024-09-15 ENCOUNTER — Emergency Department: Payer: MEDICAID

## 2024-09-15 LAB — COMPREHENSIVE METABOLIC PANEL WITH GFR
ALT: 15 U/L (ref 0–44)
AST: 12 U/L — ABNORMAL LOW (ref 15–41)
Albumin: 3.3 g/dL — ABNORMAL LOW (ref 3.5–5.0)
Alkaline Phosphatase: 75 U/L (ref 38–126)
Anion gap: 11 (ref 5–15)
BUN: 10 mg/dL (ref 6–20)
CO2: 26 mmol/L (ref 22–32)
Calcium: 8.2 mg/dL — ABNORMAL LOW (ref 8.9–10.3)
Chloride: 102 mmol/L (ref 98–111)
Creatinine, Ser: 0.77 mg/dL (ref 0.44–1.00)
GFR, Estimated: >60 mL/min (ref >60)
Glucose, Bld: 134 mg/dL — ABNORMAL HIGH (ref 70–99)
Potassium: 3.2 mmol/L — ABNORMAL LOW (ref 3.5–5.1)
Sodium: 139 mmol/L (ref 135–145)
Total Bilirubin: 0.9 mg/dL (ref 0.0–1.2)
Total Protein: 7.2 g/dL (ref 6.5–8.1)

## 2024-09-15 MED ORDER — OXYCODONE-ACETAMINOPHEN 5-325 MG PO TABS: 1.0000 | ORAL_TABLET | Freq: Four times a day (QID) | ORAL | 0 refills | Status: AC | PRN

## 2024-09-15 MED ORDER — OXYCODONE-ACETAMINOPHEN 5-325 MG PO TABS
1.0000 | ORAL_TABLET | Freq: Once | ORAL | Status: AC
  Administered 2024-09-15: 1 via ORAL
  Filled 2024-09-15: qty 1

## 2024-09-15 MED ORDER — DICYCLOMINE HCL 20 MG PO TABS: 20.0000 mg | ORAL_TABLET | Freq: Four times a day (QID) | ORAL | 0 refills | Status: DC | PRN

## 2024-09-15 MED ORDER — NAPROXEN 500 MG PO TABS: 500.0000 mg | ORAL_TABLET | Freq: Two times a day (BID) | ORAL | 0 refills | Status: DC

## 2024-09-15 MED ORDER — METRONIDAZOLE 500 MG PO TABS: 500.0000 mg | ORAL_TABLET | Freq: Two times a day (BID) | ORAL | 0 refills | Status: DC

## 2024-09-15 MED ORDER — CIPROFLOXACIN HCL 500 MG PO TABS: 500.0000 mg | ORAL_TABLET | Freq: Two times a day (BID) | ORAL | 0 refills | Status: DC

## 2024-09-15 MED ORDER — IOHEXOL 350 MG/ML SOLN
125.0000 mL | Freq: Once | INTRAVENOUS | Status: AC | PRN
Start: 2024-09-15 — End: 2024-09-15
  Administered 2024-09-15: 125 mL via INTRAVENOUS

## 2024-09-15 MED ORDER — ONDANSETRON 4 MG PO TBDP: 4.0000 mg | ORAL_TABLET | Freq: Three times a day (TID) | ORAL | 0 refills | Status: DC | PRN

## 2024-09-15 NOTE — Discharge Instructions (Addendum)
 Take naproxen  every 12 hours for background pain control.  Take oxycodone  every 6 hours for additional pain relief.  Take Zofran  if needed for nausea. Take Bentyl (dicyclomine) for cramping pain.

## 2024-09-19 ENCOUNTER — Ambulatory Visit (INDEPENDENT_AMBULATORY_CARE_PROVIDER_SITE_OTHER): Payer: MEDICAID | Admitting: Internal Medicine

## 2024-09-19 ENCOUNTER — Encounter: Payer: Self-pay | Admitting: Internal Medicine

## 2024-09-19 ENCOUNTER — Other Ambulatory Visit: Payer: Self-pay

## 2024-09-19 VITALS — BP 130/80 | HR 98 | Temp 97.8°F | Resp 16 | Ht 66.0 in | Wt 322.0 lb

## 2024-09-19 DIAGNOSIS — K5792 Diverticulitis of intestine, part unspecified, without perforation or abscess without bleeding: Secondary | ICD-10-CM

## 2024-09-19 DIAGNOSIS — E876 Hypokalemia: Secondary | ICD-10-CM | POA: Diagnosis not present

## 2024-09-19 MED ORDER — SULFAMETHOXAZOLE-TRIMETHOPRIM 800-160 MG PO TABS: 1.0000 | ORAL_TABLET | Freq: Two times a day (BID) | ORAL | 0 refills | Status: AC

## 2024-09-19 NOTE — Progress Notes (Signed)
 Established Patient Office Visit  Subjective   Patient ID: Brenda Hicks, female    DOB: 03-04-1966  Age: 58 y.o. MRN: [id number]  Chief Complaint  Patient presents with   Hospitalization Follow-up    HPI  Patient is here for ER follow up.  Discharge Date: 09/14/24 Diagnosis: diverticulitis  Procedures/tests: CT A/P showing uncomplicated acute sigmoid diverticulitis. No leucocytosis, potassium 3.2, Urine negative.  New medications: Cipro  and Flagyl   Discontinued medications: None Status: fluctuating  Discussed the use of AI scribe software for clinical note transcription with the patient, who gave verbal consent to proceed.  History of Present Illness Brenda Hicks is a 58 year old female who presents with persistent abdominal pain despite antibiotic treatment for diverticulitis.  She experiences severe abdominal pain, similar to labor pain, localized in the lower abdomen and radiating to her back. The pain worsens with a full bladder and when lying on one side. Despite starting Flagyl  and Cipro  a few days ago, her symptoms persist. She has diarrhea with four loose bowel movements and no mucus. She denies fever but has cold chills and alternating sensations of feeling cold and hot. In the ER, she had low potassium levels, likely due to diarrhea. She has no urinary issues apart from pain with a full bladder. She has no known allergies to antibiotics.   Patient Active Problem List   Diagnosis Date Noted   Numbness and tingling of left lower extremity 02/08/2024   S/P TKR (total knee replacement) using cement, left 04/27/2023   Status post total knee replacement using cement, left 04/26/2023   Chronic GERD    Gastric erosion    Abnormal perimenopausal bleeding 03/15/2022   S/P laparoscopic hysterectomy 03/15/2022   Mechanical failure of prosthetic joint 11/03/2021   Joint pain 11/03/2021   Osteoarthritis of left knee 10/08/2021   Pain due to onychomycosis of toenails of  both feet 03/05/2021   Plantar fasciitis, bilateral 03/05/2021   Frequent bowel movements 04/25/2020   Abnormal Pap smear 03/31/2020   Arthritis 03/31/2020   Sprain of collateral ligament of right knee 03/10/2020   Leg swelling 02/08/2020   S/P cervical spinal fusion 09/24/2019   Hyperlipidemia associated with type 2 diabetes mellitus (HCC) 08/08/2019   Low back pain radiating to left lower extremity 08/07/2019   Nontraumatic incomplete tear of right rotator cuff 01/04/2019   Tendinitis of upper biceps tendon of right shoulder 01/04/2019   Rotator cuff tendinitis, right 12/01/2018   Cervical myelopathy (HCC) 08/09/2018   DJD (degenerative joint disease) of cervical spine 08/04/2018   Dissection of vertebral artery 08/03/2018   History of ischemic stroke 03/30/2018   Ischemic chest pain 03/29/2018   Chest pain 03/29/2018   Sebaceous cyst 06/01/2016   Obstructive apnea 05/31/2016   Acid reflux 05/31/2016   Essential hypertension 05/31/2016   Abnormal Pap smear of cervix 05/31/2016   Arthritis of knee, degenerative 07/25/2015   History of artificial joint 07/25/2015   Gonalgia 02/17/2015   Type 2 diabetes mellitus (HCC) 10/23/2014   Mixed conductive and sensorineural hearing loss, 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/2014   Diabetic polyneuropathy associated with type 2 diabetes mellitus (HCC) 05/16/2014   Endometrial polyp 11/01/2013   Fibroids, intramural 10/09/2013   Pain due to knee joint prosthesis 10/05/2013   Body mass index (BMI) of 50-59.9 in adult (HCC) 10/03/2013   Abnormal uterine bleeding 10/03/2013   Morbid obesity (HCC) 10/01/2013   Excessive and frequent menstruation with irregular cycle 10/01/2013  Adaptive colitis 10/01/2013   History of migraine headaches 10/01/2013   H/O malignant neoplasm of skin 10/01/2013   Fatty liver disease, nonalcoholic 10/01/2013   Diverticulitis 10/01/2013   Former smoker 08/29/2013   H/O total knee  replacement 08/29/2013   Insomnia 08/29/2013   Dysmenorrhea 08/29/2013   Airway hyperreactivity 08/29/2013   Absolute anemia 08/29/2013   Past Medical History:  Diagnosis Date   Anemia    vitamin d deficiency   Anxiety    Arthritis    Asthma    WELL CONTROLLED   Cancer of ear    skin cancer left ear   COPD (chronic obstructive pulmonary disease) (HCC)    Depression    Diabetes mellitus without complication (HCC)    Fatty liver    Hypertension    Kidney cysts    per patient, never had   Renal disorder    Sleep apnea    DOES NOT USE CPAP. waiting for new machine and a new sleep study   Stroke [facility]) May or June 2019   TIA. no residual symptoms   Past Surgical History:  Procedure Laterality Date   ABDOMINAL HYSTERECTOMY     ANTERIOR CERVICAL DECOMP/DISCECTOMY FUSION N/A 08/09/2018   Procedure: ANTERIOR CERVICAL DECOMPRESSION/DISCECTOMY FUSION 1 LEVEL- C4-5;  Surgeon: Clois Fret, MD;  Location: [facility] ORS;  Service: Neurosurgery;  Laterality: N/A;   BACK SURGERY     NECK   COLONOSCOPY WITH PROPOFOL  N/A 02/26/2021   Procedure: COLONOSCOPY WITH PROPOFOL ;  Surgeon: Therisa Bi, MD;  Location: [facility] ENDOSCOPY;  Service: Endoscopy;  Laterality: N/A;   DILATATION & CURETTAGE/HYSTEROSCOPY WITH MYOSURE N/A 01/19/2021   Procedure: DILATATION & CURETTAGE/HYSTEROSCOPY;  Surgeon: Connell Davies, MD;  Location: [facility] ORS;  Service: Gynecology;  Laterality: N/A;   DILATION AND CURETTAGE OF UTERUS     ENDOMETRIAL ABLATION N/A 01/19/2021   Procedure: ENDOMETRIAL ABLATION, MINERVA;  Surgeon: Connell Davies, MD;  Location: [facility] ORS;  Service: Gynecology;  Laterality: N/A;   ENDOMETRIAL BIOPSY     benign   ESOPHAGOGASTRODUODENOSCOPY (EGD) WITH PROPOFOL  N/A 08/24/2022   Procedure: ESOPHAGOGASTRODUODENOSCOPY (EGD) WITH PROPOFOL ;  Surgeon: Unk Corinn Skiff, MD;  Location: [facility] ENDOSCOPY;  Service: Gastroenterology;  Laterality: N/A;   EXPLORATORY LAPAROTOMY  1992   REMOVAL OF RUPTURED  ECTOPIC   HAND SURGERY Right 1998   cyst removed   HERNIA REPAIR  1975   UMBILICAL   JOINT REPLACEMENT Right 2014   TKR   KNEE ARTHROSCOPY Right 2012   KNEE SURGERY Right 2014   total knee replacement   ROBOTIC ASSISTED LAPAROSCOPIC HYSTERECTOMY AND SALPINGECTOMY Bilateral 03/15/2022   Procedure: XI ROBOTIC ASSISTED LAPAROSCOPIC HYSTERECTOMY;  Surgeon: Connell Davies, MD;  Location: [facility] ORS;  Service: Gynecology;  Laterality: Bilateral;   SHOULDER ARTHROSCOPY WITH BICEPSTENOTOMY Left 12/14/2016   Procedure: SHOULDER ARTHROSCOPY WITH BICEPSTENOTOMY;  Surgeon: Norleen JINNY Maltos, MD;  Location: [facility] ORS;  Service: Orthopedics;  Laterality: Left;   SHOULDER ARTHROSCOPY WITH OPEN ROTATOR CUFF REPAIR Left 12/14/2016   Procedure: SHOULDER ARTHROSCOPY WITH OPEN ROTATOR CUFF REPAIR AND ARTHROSCOPIC ROTATOR CUFF REPAIR;  Surgeon: Norleen JINNY Maltos, MD;  Location: [facility] ORS;  Service: Orthopedics;  Laterality: Left;   SHOULDER ARTHROSCOPY WITH ROTATOR CUFF REPAIR Right 01/04/2019   Procedure: SHOULDER ARTHROSCOPY WITH ROTATOR CUFF REPAIR;  Surgeon: Maltos Norleen JINNY, MD;  Location: [facility] ORS;  Service: Orthopedics;  Laterality: Right;   SHOULDER ARTHROSCOPY WITH SUBACROMIAL DECOMPRESSION Left 12/14/2016   Procedure: SHOULDER ARTHROSCOPY WITH SUBACROMIAL DECOMPRESSION;  Surgeon: Norleen JINNY Maltos, MD;  Location:  [facility] ORS;  Service: Orthopedics;  Laterality: Left;   SHOULDER ARTHROSCOPY WITH SUBACROMIAL DECOMPRESSION AND BICEP TENDON REPAIR Right 01/04/2019   Procedure: SHOULDER ARTHROSCOPY WITH DEBRIDEMENT AND SUBACROMIAL DECOMPRESSION-RIGHT;  Surgeon: Edie Norleen PARAS, MD;  Location: [facility] ORS;  Service: Orthopedics;  Laterality: Right;   TOTAL KNEE ARTHROPLASTY Left 04/26/2023   Procedure: TOTAL KNEE ARTHROPLASTY;  Surgeon: Edie Norleen PARAS, MD;  Location: [facility] ORS;  Service: Orthopedics;  Laterality: Left;   TUBAL LIGATION     Social History   Tobacco Use   Smoking status: Former    Current packs/day: 0.00    Average  packs/day: 2.0 packs/day for 25.0 years (50.0 ttl pk-yrs)    Types: Cigarettes    Start date: 06/09/1992    Quit date: 06/09/2017    Years since quitting: 7.2   Smokeless tobacco: Never  Vaping Use   Vaping status: Never Used  Substance Use Topics   Alcohol use: No   Drug use: Yes    Frequency: 7.0 times per week    Types: Marijuana    Comment: pt states as needed when she is hurting real bad   Social History   Socioeconomic History   Marital status: Married    Spouse name: brian   Number of children: 7   Years of education: Not on file   Highest education level: 11th grade  Occupational History   Occupation: disabled    Comment: disabled  Tobacco Use   Smoking status: Former    Current packs/day: 0.00    Average packs/day: 2.0 packs/day for 25.0 years (50.0 ttl pk-yrs)    Types: Cigarettes    Start date: 06/09/1992    Quit date: 06/09/2017    Years since quitting: 7.2   Smokeless tobacco: Never  Vaping Use   Vaping status: Never Used  Substance and Sexual Activity   Alcohol use: No   Drug use: Yes    Frequency: 7.0 times per week    Types: Marijuana    Comment: pt states as needed when she is hurting real bad   Sexual activity: Yes    Partners: Male    Birth control/protection: None  Other Topics Concern   Not on file  Social History Narrative   Have 6 biological childrean and custody of niece since 52 months.   Social Drivers of Corporate Investment Banker Strain: Low Risk  (07/03/2024)   Received from [facility]   Overall Financial Resource Strain (CARDIA)    Difficulty of Paying Living Expenses: Not very hard  Recent Concern: Financial Resource Strain - High Risk (05/28/2024)   Overall Financial Resource Strain (CARDIA)    Difficulty of Paying Living Expenses: Very hard  Food Insecurity: Food Insecurity Present (07/03/2024)   Received from [facility]   Hunger Vital Sign    Within the past 12 months, you worried that your  food would run out before you got the money to buy more.: Sometimes true    Within the past 12 months, the food you bought just didn't last and you didn't have money to get more.: Sometimes true  Transportation Needs: Unmet Transportation Needs (07/03/2024)   Received [facility]    In the past 12 months, has lack of transportation kept you from medical appointments or from getting medications?: Yes    Lack of Transportation (Non-Medical): Yes  Physical Activity: Unknown (02/16/2023)   Exercise Vital Sign    Days of  Exercise per Week: Patient declined    Minutes of Exercise per Session: Not on file  Stress: No Stress Concern Present (02/16/2023)   [employer] of Occupational Health - Occupational Stress Questionnaire    Feeling of Stress : Only a little  Social Connections: Moderately Isolated (02/16/2023)   Social Connection and Isolation Panel    Frequency of Communication with Friends and Family: Once a week    Frequency of Social Gatherings with Friends and Family: Never    Attends Religious Services: More than 4 times per year    Active Member of [employer] or Organizations: No    Attends Engineer, Structural: Not on file    Marital Status: Married  Catering Manager Violence: Not At Risk (04/26/2023)   Humiliation, Afraid, Rape, and Kick questionnaire    Fear of Current or Ex-Partner: No    Emotionally Abused: No    Physically Abused: No    Sexually Abused: No   Family Status  Relation Name Status   Mother  Alive   Father  Deceased   Sister  Alive   Brother Tanda Pouch Alive   MGM  Deceased   Neg Hx  (Not Specified)  No partnership data on file   Family History  Problem Relation Age of Onset   Hypertension Mother    Thyroid  disease Mother    Lupus Mother    Congestive Heart Failure Mother    Hypertension Brother    CAD Maternal Grandmother    Breast cancer Neg Hx    Ovarian cancer Neg Hx    Colon cancer  Neg Hx    Allergies  Allergen Reactions   Dilaudid  [Hydromorphone  Hcl] Nausea And Vomiting   Cymbalta  [Duloxetine  Hcl] Nausea And Vomiting   Morphine  And Codeine Nausea And Vomiting   Tramadol  Nausea Only    If she takes antinausea medicine with this medicine, then she can tolerate it.      Review of Systems  Constitutional:  Negative for chills and fever.  Gastrointestinal:  Positive for abdominal pain. Negative for constipation, diarrhea, nausea and vomiting.      Objective:     BP 130/80 (Cuff Size: Large)   Pulse 98   Temp 97.8 F (36.6 C) (Oral)   Resp 16   Ht 5' 6 (1.676 m)   Wt (!) 322 lb (146.1 kg)   LMP 02/22/2022 (Approximate)   SpO2 100%   BMI 51.97 kg/m  BP Readings from Last 3 Encounters:  09/19/24 130/80  09/15/24 136/79  08/10/24 (!) 142/84   Wt Readings from Last 3 Encounters:  09/19/24 (!) 322 lb (146.1 kg)  09/14/24 300 lb (136.1 kg)  08/10/24 (!) 321 lb 6.4 oz (145.8 kg)      Physical Exam Constitutional:      Appearance: Normal appearance.  HENT:     Head: Normocephalic and atraumatic.  Eyes:     Conjunctiva/sclera: Conjunctivae normal.  Cardiovascular:     Rate and Rhythm: Normal rate and regular rhythm.  Pulmonary:     Effort: Pulmonary effort is normal.     Breath sounds: Normal breath sounds.  Abdominal:     General: Bowel sounds are normal. There is no distension.     Palpations: Abdomen is soft.     Tenderness: There is abdominal tenderness. There is no guarding or rebound.     Comments: LLQ pain to palpation   Skin:    General: Skin is warm and dry.  Neurological:     General:  No focal deficit present.     Mental Status: She is alert. Mental status is at baseline.  Psychiatric:        Mood and Affect: Mood normal.        Behavior: Behavior normal.      No results found for any visits on 09/19/24.  Last CBC Lab Results  Component Value Date   WBC 8.4 09/14/2024   HGB 13.7 09/14/2024   HCT 44.0 09/14/2024   MCV  94.0 09/14/2024   MCH 29.3 09/14/2024   RDW 13.2 09/14/2024   PLT 247 09/14/2024   Last metabolic panel Lab Results  Component Value Date   GLUCOSE 134 (H) 09/15/2024   NA 139 09/15/2024   K 3.2 (L) 09/15/2024   CL 102 09/15/2024   CO2 26 09/15/2024   BUN 10 09/15/2024   CREATININE 0.77 09/15/2024   GFRNONAA >60 09/15/2024   CALCIUM  8.2 (L) 09/15/2024   PROT 7.2 09/15/2024   ALBUMIN  3.3 (L) 09/15/2024   BILITOT 0.9 09/15/2024   ALKPHOS 75 09/15/2024   AST 12 (L) 09/15/2024   ALT 15 09/15/2024   ANIONGAP 11 09/15/2024   Last lipids Lab Results  Component Value Date   CHOL 185 12/14/2021   HDL 41 (L) 12/14/2021   LDLCALC 122 (H) 12/14/2021   TRIG 109 12/14/2021   CHOLHDL 4.5 12/14/2021   Last hemoglobin A1c Lab Results  Component Value Date   HGBA1C 8.9 (A) 08/10/2024   Last thyroid  functions Lab Results  Component Value Date   TSH 1.614 03/15/2022   T4TOTAL 7.9 07/18/2019   FREET4 0.86 08/15/2018   Last vitamin D No results found for: 25OHVITD2, 25OHVITD3, VD25OH Last vitamin B12 and Folate No results found for: VITAMINB12, FOLATE    The ASCVD Risk score (Arnett DK, et al., 2019) failed to calculate for the following reasons:   Risk score cannot be calculated because patient has a medical history suggesting prior/existing ASCVD    Assessment & Plan:   Assessment & Plan Acute uncomplicated diverticulitis of colon Acute uncomplicated diverticulitis confirmed by CT. Initial treatment with Cipro  and Flagyl . Concern for Cipro  resistance. Flagyl  effective. Significant abdominal pain reported but diarrhea resolved. - Discontinue Cipro . - Continue Flagyl  (metronidazole ) for 7 days. - Start Bactrim  twice a day for 7 days. - Advise bland diet during acute phase. - Increase dietary fiber intake once symptoms improve. - Use heating pads or lidocaine  patches for pain relief. - Monitor for worsening pain or complications and return to ER if symptoms  worsen.  Hypokalemia Low potassium likely due to diarrhea. Recheck at follow up.   - sulfamethoxazole -trimethoprim  (BACTRIM  DS) 800-160 MG tablet; Take 1 tablet by mouth 2 (two) times daily for 7 days.  Dispense: 14 tablet; Refill: 0   Return in about 2 months (around 11/19/2024).    Sharyle Fischer, DO

## 2024-09-19 NOTE — Patient Instructions (Addendum)
 It was great seeing you today!  Plan discussed at today's visit: -Stop Cipro  and start Bactrim  instead twice a day for 7 days, continue Flagyl  until complete -Eat bland foods for now and increase fiber going forward once symptoms resolve to prevent re-occurrence   Follow up in: 2 months  Take care and let us  know if you have any questions or concerns prior to your next visit.  Dr. Bernardo  Diverticulitis  Diverticulitis happens when poop (stool) and bacteria get trapped in small pouches in the colon called diverticula. These pouches may form if you have a condition called diverticulosis. When the poop and bacteria get trapped, it can cause an infection and inflammation. Diverticulitis may cause severe stomach pain and diarrhea. It can also lead to tissue damage in your colon. This can cause bleeding or blockage. In some cases, the diverticula may burst (rupture). This can cause infected poop to go into other parts of your abdomen. What are the causes? This condition is caused by poop getting trapped in the diverticula. This allows bacteria to grow. It can lead to inflammation and infection. What increases the risk? You are more likely to get this condition if you have diverticulosis. You are also more at risk if: You are overweight or obese. You do not get enough exercise. You drink alcohol. You smoke. You eat a lot of red meat, such as beef, pork, or lamb. You do not get enough fiber. Foods high in fiber include fruits, vegetables, beans, nuts, and whole grains. You are over 61 years of age. What are the signs or symptoms? Symptoms of this condition may include: Pain and tenderness in the abdomen. This pain is often felt on the left side but may occur in other spots. Fever and chills. Nausea and vomiting. Cramping. Bloating. Changes in how often you poop. Blood in your poop. How is this diagnosed? This condition is diagnosed based on your medical history and a physical exam. You  may also have tests done to make sure there is nothing else causing your condition. These tests may include: Blood tests. Tests done on your pee (urine). A CT scan of the abdomen. You may need to have a colonoscopy. This is an exam to look at your whole large intestine. During the exam, a tube is put into the opening of your butt (anus) and then moved into your rectum, colon, and other parts of the large intestine. This exam is done to look at the diverticula. It can also see if there is something else that may be causing your symptoms. How is this treated? Most cases are mild and can be treated at home. You may be told to: Take over-the-counter pain medicine. Only eat and drink clear liquids. Take antibiotics. Rest. More severe cases may need to be treated at a hospital. Treatment may include: Not eating or drinking. Taking pain medicines. Getting antibiotics through an IV. Getting fluids and nutrition through an IV. Surgery. Follow these instructions at home: Medicines Take over-the-counter and prescription medicines only as told by your health care provider. These include fiber supplements, probiotics, and medicines to soften your poop (stool softeners). If you were prescribed antibiotics, take them as told by your provider. Do not stop using the antibiotic even if you start to feel better. Ask your provider if the medicine prescribed to you requires you to avoid driving or using machinery. Eating and drinking  Follow the diet told by your provider. You may need to only eat and drink liquids.  After your symptoms get better, you may be able to return to a more normal diet. You may be told to eat at least 25 grams (25 g) of fiber each day. Fiber makes it easier to poop. Healthy sources of fiber include: Berries. One cup has 4-8 g of fiber. Beans or lentils. One-half cup has 5-8 g of fiber. Green vegetables. One cup has 4 g of fiber. Avoid eating red meat. General instructions Do not  use any products that contain nicotine or tobacco. These products include cigarettes, chewing tobacco, and vaping devices, such as e-cigarettes. If you need help quitting, ask your provider. Exercise for at least 30 minutes, 3 times a week. Exercise hard enough to raise your heart rate and break a sweat. Contact a health care provider if: Your pain gets worse. Your pooping does not go back to normal. Your symptoms do not get better with treatment. Your symptoms get worse all of a sudden. You have a fever. You vomit more than one time. Your poop is bloody, black, or tarry. This information is not intended to replace advice given to you by your health care provider. Make sure you discuss any questions you have with your health care provider. Document Revised: 08/05/2022 Document Reviewed: 08/05/2022 Elsevier Patient Education  2024 [employer].

## 2024-09-24 ENCOUNTER — Encounter: Payer: Self-pay | Admitting: Radiology

## 2024-09-28 ENCOUNTER — Emergency Department: Payer: MEDICAID

## 2024-09-28 ENCOUNTER — Emergency Department
Admission: EM | Admit: 2024-09-28 | Discharge: 2024-09-28 | Disposition: A | Discharge status: Home / Self Care | Payer: MEDICAID | Attending: Emergency Medicine | Admitting: Emergency Medicine

## 2024-09-28 ENCOUNTER — Other Ambulatory Visit: Payer: Self-pay

## 2024-09-28 DIAGNOSIS — I10 Essential (primary) hypertension: Secondary | ICD-10-CM | POA: Insufficient documentation

## 2024-09-28 DIAGNOSIS — R1084 Generalized abdominal pain: Secondary | ICD-10-CM | POA: Diagnosis present

## 2024-09-28 DIAGNOSIS — R112 Nausea with vomiting, unspecified: Secondary | ICD-10-CM | POA: Diagnosis not present

## 2024-09-28 DIAGNOSIS — Z8719 Personal history of other diseases of the digestive system: Secondary | ICD-10-CM | POA: Insufficient documentation

## 2024-09-28 DIAGNOSIS — Z8673 Personal history of transient ischemic attack (TIA), and cerebral infarction without residual deficits: Secondary | ICD-10-CM | POA: Insufficient documentation

## 2024-09-28 DIAGNOSIS — E119 Type 2 diabetes mellitus without complications: Secondary | ICD-10-CM | POA: Insufficient documentation

## 2024-09-28 LAB — URINALYSIS, ROUTINE W REFLEX MICROSCOPIC
Bilirubin Urine: NEGATIVE
Glucose, UA: NEGATIVE mg/dL
Hgb urine dipstick: NEGATIVE
Ketones, ur: NEGATIVE mg/dL
Leukocytes,Ua: NEGATIVE
Nitrite: NEGATIVE
Protein, ur: NEGATIVE mg/dL
Specific Gravity, Urine: 1.019 (ref 1.005–1.030)
pH: 5 (ref 5.0–8.0)

## 2024-09-28 LAB — CBC
HCT: 45.8 % (ref 36.0–46.0)
Hemoglobin: 14.1 g/dL (ref 12.0–15.0)
MCH: 29 pg (ref 26.0–34.0)
MCHC: 30.8 g/dL (ref 30.0–36.0)
MCV: 94.2 fL (ref 80.0–100.0)
Platelets: 276 K/uL (ref 150–400)
RBC: 4.86 MIL/uL (ref 3.87–5.11)
RDW: 13.2 % (ref 11.5–15.5)
WBC: 5.5 K/uL (ref 4.0–10.5)
nRBC: 0 % (ref 0.0–0.2)

## 2024-09-28 LAB — COMPREHENSIVE METABOLIC PANEL WITH GFR
ALT: 26 U/L (ref 0–44)
AST: 26 U/L (ref 15–41)
Albumin: 3.6 g/dL (ref 3.5–5.0)
Alkaline Phosphatase: 71 U/L (ref 38–126)
Anion gap: 12 (ref 5–15)
BUN: 13 mg/dL (ref 6–20)
CO2: 25 mmol/L (ref 22–32)
Calcium: 8.6 mg/dL — ABNORMAL LOW (ref 8.9–10.3)
Chloride: 102 mmol/L (ref 98–111)
Creatinine, Ser: 0.94 mg/dL (ref 0.44–1.00)
GFR, Estimated: >60 mL/min (ref >60)
Glucose, Bld: 163 mg/dL — ABNORMAL HIGH (ref 70–99)
Potassium: 4 mmol/L (ref 3.5–5.1)
Sodium: 139 mmol/L (ref 135–145)
Total Bilirubin: 0.6 mg/dL (ref 0.0–1.2)
Total Protein: 7.8 g/dL (ref 6.5–8.1)

## 2024-09-28 LAB — LIPASE, BLOOD: Lipase: 41 U/L (ref 11–51)

## 2024-09-28 MED ORDER — DICYCLOMINE HCL 20 MG PO TABS: 20.0000 mg | ORAL_TABLET | Freq: Four times a day (QID) | ORAL | 0 refills | Status: DC | PRN

## 2024-09-28 MED ORDER — DICYCLOMINE HCL 10 MG PO CAPS
10.0000 mg | ORAL_CAPSULE | Freq: Once | ORAL | Status: AC
  Administered 2024-09-28: 10 mg via ORAL
  Filled 2024-09-28: qty 1

## 2024-09-28 MED ORDER — ONDANSETRON 4 MG PO TBDP
4.0000 mg | ORAL_TABLET | Freq: Once | ORAL | Status: AC
  Administered 2024-09-28: 4 mg via ORAL
  Filled 2024-09-28: qty 1

## 2024-09-28 MED ORDER — ONDANSETRON 4 MG PO TBDP: 4.0000 mg | ORAL_TABLET | Freq: Three times a day (TID) | ORAL | 0 refills | Status: DC | PRN

## 2024-09-28 NOTE — ED Provider Notes (Signed)
 [facility] Provider Note    Event Date/Time   First MD Initiated Contact with Patient 09/28/24 1047     (approximate)   History   Abdominal Pain   HPI  Brenda Hicks is a 58 y.o. female   presents to the ED with complaint of abdominal pain, nausea and vomiting that started last night.  Patient states in the last 24 hours she has only vomited 1 time.  She feels that this is the same feeling she had when she was diagnosed with diverticulitis 2 weeks ago.  Patient denies any fever or chills.  Patient describes a cramping sensation that she believes may be causing the nausea.  In addition to her diverticulitis she also has a history of hypertension, type 2 diabetes, migraine headaches, anemia, arthritis, chronic GERD, history of stroke.      Physical Exam   Triage Vital Signs: ED Triage Vitals  Encounter Vitals Group     BP 09/28/24 0946 (!) 159/114     Girls Systolic BP Percentile --      Girls Diastolic BP Percentile --      Boys Systolic BP Percentile --      Boys Diastolic BP Percentile --      Pulse Rate 09/28/24 0946 78     Resp 09/28/24 0946 (!) 21     Temp 09/28/24 0946 98.2 F (36.8 C)     Temp src --      SpO2 09/28/24 0946 97 %     Weight 09/28/24 0944 (!) 321 lb 14 oz (146 kg)     Height 09/28/24 0944 5' 6 (1.676 m)     Head Circumference --      Peak Flow --      Pain Score 09/28/24 0944 10     Pain Loc --      Pain Education --      Exclude from Growth Chart --     Most recent vital signs: Vitals:   09/28/24 1245 09/28/24 1300  BP:    Pulse: 67 67  Resp:    Temp:    SpO2: 100% 100%     General: Awake, no distress.  Alert, talkative, cooperative. CV:  Good peripheral perfusion.  Heart rate and rate rhythm. Resp:  Normal effort.  Lungs are clear bilaterally. Abd:  No distention.  Soft, minimal tenderness on palpation diffusely.  Bowel sounds normoactive x 4 quadrants. Other:     ED Results / Procedures / Treatments    Labs (all labs ordered are listed, but only abnormal results are displayed) Labs Reviewed  COMPREHENSIVE METABOLIC PANEL WITH GFR - Abnormal; Notable for the following components:      Result Value   Glucose, Bld 163 (*)    Calcium  8.6 (*)    All other components within normal limits  URINALYSIS, ROUTINE W REFLEX MICROSCOPIC - Abnormal; Notable for the following components:   Color, Urine YELLOW (*)    APPearance HAZY (*)    All other components within normal limits  LIPASE, BLOOD  CBC      RADIOLOGY 1 view abdomen x-ray images were reviewed and interpreted by self independent of the radiologist and no free air or obstruction was noted.  Official radiology report is no bowel obstruction.    PROCEDURES:  Critical Care performed:   Procedures   MEDICATIONS ORDERED IN ED: Medications  ondansetron  (ZOFRAN -ODT) disintegrating tablet 4 mg (4 mg Oral Given 09/28/24 1153)  dicyclomine (BENTYL) capsule 10 mg (  10 mg Oral Given 09/28/24 1154)     IMPRESSION / MDM / ASSESSMENT AND PLAN / ED COURSE  I reviewed the triage vital signs and the nursing notes.   Differential diagnosis includes, but is not limited to, diverticulitis, abdominal cramping, nausea, or lysed abdominal pain, urinary tract infection, pancreatitis.  58 year old female presents to the ED with complaint of abdominal discomfort and cramping that started yesterday.  There was no continued vomiting or diarrhea while in the ED and patient's symptoms subsided with Zofran  and Bentyl.  Patient reports that she is out of both these medications at home.  CBC was within normal limits and CMP unremarkable with the exception of glucose being 163.  Urinalysis negative.  With patient feeling much better and lab work reassuring a prescription for the Bentyl and Zofran  was sent to the pharmacy.  Patient has to follow-up with her PCP or Dr. Onita who is the gastroenterologist on-call.      Patient's presentation is most  consistent with acute complicated illness / injury requiring diagnostic workup.  FINAL CLINICAL IMPRESSION(S) / ED DIAGNOSES   Final diagnoses:  Generalized abdominal pain  History of diverticulitis     Rx / DC Orders   ED Discharge Orders          Ordered    dicyclomine (BENTYL) 20 MG tablet  Every 6 hours PRN        09/28/24 1339    ondansetron  (ZOFRAN -ODT) 4 MG disintegrating tablet  Every 8 hours PRN        09/28/24 1339             Note:  This document was prepared using Dragon voice recognition software and may include unintentional dictation errors.   Saunders Shona CROME, PA-C 09/28/24 1519    Dicky Anes, MD 09/28/24 2003

## 2024-09-28 NOTE — ED Triage Notes (Signed)
 Pt to ED abd pain, n/d started last night. Here for diverticulitis 2 weeks ago, states feels similar.

## 2024-09-28 NOTE — ED Notes (Signed)
 See triage note  Presents with left lower quad pain  States pain started yesterday  Feels like diverticulitis  Positive n/v Afebrile on arrival

## 2024-09-28 NOTE — Discharge Instructions (Addendum)
 Call make an appointment with your primary care provider or with Dr. Onita if you continue having problems as he is a gastroenterologist and for further evaluation.  His contact information and address are listed on your discharge papers.  A prescription for Bentyl as needed for cramping and Zofran  if needed for nausea was sent to your pharmacy.  Return to the emergency department if any severe worsening of your symptoms or urgent concerns.

## 2024-09-28 NOTE — ED Notes (Signed)
 Patient declined discharge vital signs.

## 2024-09-28 NOTE — ED Notes (Signed)
 Patient ambulated to the hallway bathroom with a steady gait. Patient declined to put her shoes on and walked in her socks.

## 2024-10-20 ENCOUNTER — Emergency Department
Admission: EM | Admit: 2024-10-20 | Discharge: 2024-10-20 | Disposition: A | Discharge status: Home / Self Care | Payer: MEDICAID | Attending: Emergency Medicine | Admitting: Emergency Medicine

## 2024-10-20 ENCOUNTER — Other Ambulatory Visit: Payer: Self-pay

## 2024-10-20 ENCOUNTER — Emergency Department: Payer: MEDICAID

## 2024-10-20 DIAGNOSIS — Z8673 Personal history of transient ischemic attack (TIA), and cerebral infarction without residual deficits: Secondary | ICD-10-CM | POA: Insufficient documentation

## 2024-10-20 DIAGNOSIS — I10 Essential (primary) hypertension: Secondary | ICD-10-CM | POA: Insufficient documentation

## 2024-10-20 DIAGNOSIS — Z96652 Presence of left artificial knee joint: Secondary | ICD-10-CM | POA: Insufficient documentation

## 2024-10-20 DIAGNOSIS — M5432 Sciatica, left side: Secondary | ICD-10-CM | POA: Diagnosis not present

## 2024-10-20 DIAGNOSIS — M79605 Pain in left leg: Secondary | ICD-10-CM

## 2024-10-20 DIAGNOSIS — E119 Type 2 diabetes mellitus without complications: Secondary | ICD-10-CM | POA: Diagnosis not present

## 2024-10-20 LAB — CBC WITH DIFFERENTIAL/PLATELET
Abs Immature Granulocytes: 0.02 K/uL (ref 0.00–0.07)
Basophils Absolute: 0 K/uL (ref 0.0–0.1)
Basophils Relative: 0 %
Eosinophils Absolute: 0.1 K/uL (ref 0.0–0.5)
Eosinophils Relative: 1 %
HCT: 42.5 % (ref 36.0–46.0)
Hemoglobin: 13.4 g/dL (ref 12.0–15.0)
Immature Granulocytes: 0 %
Lymphocytes Relative: 34 %
Lymphs Abs: 2.1 K/uL (ref 0.7–4.0)
MCH: 29.3 pg (ref 26.0–34.0)
MCHC: 31.5 g/dL (ref 30.0–36.0)
MCV: 92.8 fL (ref 80.0–100.0)
Monocytes Absolute: 0.5 K/uL (ref 0.1–1.0)
Monocytes Relative: 8 %
Neutro Abs: 3.3 K/uL (ref 1.7–7.7)
Neutrophils Relative %: 57 %
Platelets: 217 K/uL (ref 150–400)
RBC: 4.58 MIL/uL (ref 3.87–5.11)
RDW: 13.2 % (ref 11.5–15.5)
WBC: 6 K/uL (ref 4.0–10.5)
nRBC: 0 % (ref 0.0–0.2)

## 2024-10-20 LAB — BASIC METABOLIC PANEL WITH GFR
Anion gap: 10 (ref 5–15)
BUN: 11 mg/dL (ref 6–20)
CO2: 27 mmol/L (ref 22–32)
Calcium: 8.8 mg/dL — ABNORMAL LOW (ref 8.9–10.3)
Chloride: 102 mmol/L (ref 98–111)
Creatinine, Ser: 0.99 mg/dL (ref 0.44–1.00)
GFR, Estimated: >60 mL/min (ref >60)
Glucose, Bld: 151 mg/dL — ABNORMAL HIGH (ref 70–99)
Potassium: 4.2 mmol/L (ref 3.5–5.1)
Sodium: 138 mmol/L (ref 135–145)

## 2024-10-20 MED ORDER — PREDNISONE 10 MG PO TABS: ORAL_TABLET | ORAL | 0 refills | Status: DC

## 2024-10-20 MED ORDER — OXYCODONE HCL 5 MG PO TABS: 5.0000 mg | ORAL_TABLET | Freq: Four times a day (QID) | ORAL | 0 refills | Status: DC | PRN

## 2024-10-20 MED ORDER — OXYCODONE HCL 5 MG PO TABS
5.0000 mg | ORAL_TABLET | Freq: Once | ORAL | Status: AC
  Administered 2024-10-20: 5 mg via ORAL
  Filled 2024-10-20: qty 1

## 2024-10-20 NOTE — ED Provider Notes (Signed)
 [facility] Provider Note    Event Date/Time   First MD Initiated Contact with Patient 10/20/24 1243     (approximate)   History   Leg Pain, Back Pain, and Leg Swelling   HPI  Brenda Hicks is a 58 y.o. female   presents to the ED with complaint of left lower leg pain.  Patient gives a history of pain in her left leg for approximately 2 years after having knee replacement.  She states she is also has some low back pain that radiates into her left leg and sometimes feels like is going numb.  She has been seen by orthopedics who focus more on her knee replacement.  She states that she has not been placed on any medication.  2 days ago the pain became worse.  She states that she feels that there is some swelling behind her knee which makes it very painful to bend.  She continues to ambulate but states that this has become more painful.  No prior history of DVT.  No recent injury.  Patient has history of chronic GERD, degenerative joint disease, history of CVA, diabetes type 2, hypertension, migraines, and morbid obesity.      Physical Exam   Triage Vital Signs: ED Triage Vitals  Encounter Vitals Group     BP 10/20/24 1230 (!) 155/104     Girls Systolic BP Percentile --      Girls Diastolic BP Percentile --      Boys Systolic BP Percentile --      Boys Diastolic BP Percentile --      Pulse Rate 10/20/24 1230 88     Resp 10/20/24 1230 20     Temp 10/20/24 1230 98.2 F (36.8 C)     Temp Source 10/20/24 1230 Oral     SpO2 10/20/24 1230 99 %     Weight 10/20/24 1228 300 lb (136.1 kg)     Height 10/20/24 1228 5' 6 (1.676 m)     Head Circumference --      Peak Flow --      Pain Score 10/20/24 1225 10     Pain Loc --      Pain Education --      Exclude from Growth Chart --     Most recent vital signs: Vitals:   10/20/24 1230  BP: (!) 155/104  Pulse: 88  Resp: 20  Temp: 98.2 F (36.8 C)  SpO2: 99%     General: Awake, no distress.  Alert,  talkative, able to answer questions appropriately. CV:  Good peripheral perfusion.  Heart regular rate and rhythm. Resp:  Normal effort.  Lungs clear bilaterally. Abd:  No distention.  Other:  On examination of the left knee there is no gross deformity.  There is moderate tenderness on palpation of the left lower leg posteriorly especially in the knee area.  Skin is intact.  No erythema or warmth is noted.  No appreciable effusion to the left knee.  Patient is able to stand and ambulate.   ED Results / Procedures / Treatments   Labs (all labs ordered are listed, but only abnormal results are displayed) Labs Reviewed  BASIC METABOLIC PANEL WITH GFR - Abnormal; Notable for the following components:      Result Value   Glucose, Bld 151 (*)    Calcium  8.8 (*)    All other components within normal limits  CBC WITH DIFFERENTIAL/PLATELET     RADIOLOGY  Venous ultrasound  of the left lower extremity per radiology is negative for DVT.   PROCEDURES:  Critical Care performed:   Procedures   MEDICATIONS ORDERED IN ED: Medications  oxyCODONE  (Oxy IR/ROXICODONE ) immediate release tablet 5 mg (5 mg Oral Given 10/20/24 1259)     IMPRESSION / MDM / ASSESSMENT AND PLAN / ED COURSE  I reviewed the triage vital signs and the nursing notes.   Differential diagnosis includes, but is not limited to, low back pain with left lower extremity radiculopathy, DVT, Baker's cyst, muscle skeletal pain.  58 year old female presents to the ED with complaint of low back pain with radiation down her left leg that has been ongoing for approximately 2 years.  She states in the last few days it has become much worse and that is more in the back of her leg that is bothering her.  She is continue to ambulate but having pain doing so.  Ultrasound was reassuring and patient was made aware that she does not have a DVT.  We discussed several medications and therapy options.  Patient is aware that if we place her on  some steroids to help with this that it could cause her glucose levels to increase.  She will be very mindful of what she is eating and is aware that the steroids could do this that she has been on in the past.  She has to follow-up with her PCP if any continued problems.  She is return to the emergency department if any severe worsening of her symptoms.      Patient's presentation is most consistent with acute presentation with potential threat to life or bodily function.  FINAL CLINICAL IMPRESSION(S) / ED DIAGNOSES   Final diagnoses:  Sciatica of left side  Left leg pain     Rx / DC Orders   ED Discharge Orders          Ordered    oxyCODONE  (OXY IR/ROXICODONE ) 5 MG immediate release tablet  Every 6 hours PRN        10/20/24 1424    predniSONE  (DELTASONE ) 10 MG tablet        10/20/24 1424             Note:  This document was prepared using Dragon voice recognition software and may include unintentional dictation errors.   Saunders Shona CROME, PA-C 10/20/24 1430    Arlander Charleston, MD 10/20/24 [phone]

## 2024-10-20 NOTE — Discharge Instructions (Addendum)
 Follow-up primary care provider if any continued problems or concerns.  You may also use some warm moist compresses or a heating pad to your lower back to see if this helps.  A prescription for oxycodone  was sent to the pharmacy for you to take only as needed for pain.  Be aware that this medication could cause drowsiness and increase your risk for falling.  Prednisone  is 3 tablets once a day for the next 5 days.  As we discussed be very mindful of your food intake and your diabetes as the steroids can cause increase in your blood sugar.  No blood clot was seen on your ultrasound.

## 2024-10-20 NOTE — ED Triage Notes (Signed)
 Pt to ED for L lower back pain that radiates down L leg to thigh. Also tingling pins and needles, like something's pinching to that leg. States on Thursday, same leg almost gave out on her. States has been having problems to L leg including foot numbness ever since knee replacement 2 years ago but pain got worse 2 days ago.  Since yesterday has had pain to behind L knee and some medial calf swelling. States painful to bend the knee. States that when she stands up, it feels like same leg has fluid on it. States skin is tender. No obvious swelling or redness noted.

## 2024-10-21 ENCOUNTER — Other Ambulatory Visit: Payer: Self-pay | Admitting: Internal Medicine

## 2024-10-21 ENCOUNTER — Encounter: Payer: Self-pay | Admitting: Internal Medicine

## 2024-10-21 DIAGNOSIS — E1165 Type 2 diabetes mellitus with hyperglycemia: Secondary | ICD-10-CM

## 2024-10-22 ENCOUNTER — Other Ambulatory Visit: Payer: Self-pay | Admitting: Internal Medicine

## 2024-10-22 ENCOUNTER — Telehealth: Payer: Self-pay | Admitting: Emergency Medicine

## 2024-10-22 DIAGNOSIS — M5442 Lumbago with sciatica, left side: Secondary | ICD-10-CM

## 2024-10-22 MED ORDER — OXYCODONE HCL 5 MG PO TABS: 5.0000 mg | ORAL_TABLET | Freq: Four times a day (QID) | ORAL | 0 refills | Status: AC | PRN

## 2024-10-22 NOTE — Telephone Encounter (Signed)
 Pharm doesn't have med

## 2024-10-24 NOTE — Telephone Encounter (Signed)
 Requested Prescriptions  Pending Prescriptions Disp Refills   OZEMPIC , 1 MG/DOSE, 4 MG/3ML SOPN [Pharmacy Med Name: OZEMPIC  4 MG/3 ML (1 MG/DOSE)]  2    Sig: INJECT 1 MG ONCE A WEEK AS DIRECTED     Endocrinology:  Diabetes - GLP-1 Receptor Agonists - semaglutide  Failed - 10/24/2024  2:49 PM      Failed - HBA1C in normal range and within 180 days    Hemoglobin A1C  Date Value Ref Range Status  08/10/2024 8.9 (A) 4.0 - 5.6 % Final  12/27/2012 10.1 (H) 4.2 - 6.3 % Final    Comment:    The American Diabetes Association recommends that a primary goal of therapy should be <7% and that physicians should reevaluate the treatment regimen in patients with HbA1c values consistently >8%.    Hgb A1c MFr Bld  Date Value Ref Range Status  03/15/2022 7.8 (H) 4.8 - 5.6 % Final    Comment:    (NOTE) Pre diabetes:          5.7%-6.4%  Diabetes:              >6.4%  Glycemic control for   <7.0% adults with diabetes          Passed - Cr in normal range and within 360 days    Creat  Date Value Ref Range Status  04/25/2020 1.00 0.50 - 1.05 mg/dL Final    Comment:    For patients >19 years of age, the reference limit for Creatinine is approximately 13% higher for people identified as African-American. .    Creatinine, Ser  Date Value Ref Range Status  10/20/2024 0.99 0.44 - 1.00 mg/dL Final   Creatinine, Urine  Date Value Ref Range Status  08/10/2024 185 20 - 275 mg/dL Final         Passed - Valid encounter within last 6 months    Recent Outpatient Visits           1 month ago Diverticulitis   [facility]ardo Fend, DO   2 months ago Type 2 diabetes mellitus with hyperglycemia, with long-term current use of insulin  [facility])   [facility] Bernardo Fend, DO   5 months ago Pain and swelling of left knee   [facility] Bernardo Fend, [place]

## 2024-10-25 ENCOUNTER — Other Ambulatory Visit: Payer: Self-pay | Admitting: Internal Medicine

## 2024-10-26 ENCOUNTER — Other Ambulatory Visit: Payer: Self-pay | Admitting: Internal Medicine

## 2024-11-07 LAB — HM DIABETES EYE EXAM

## 2024-11-14 ENCOUNTER — Telehealth: Payer: Self-pay

## 2024-11-14 NOTE — Telephone Encounter (Signed)
 Copied from CRM #[id number]. Topic: General - Other >> Nov 14, 2024 10:13 AM Charlet HERO wrote: Reason for CRM: Patient is calling to get note from doctor about disabilty for her apartment complex so that she can keep dryer and handicap spot. Please call patient when finished

## 2024-11-19 ENCOUNTER — Encounter: Payer: Self-pay | Admitting: Internal Medicine

## 2024-11-19 ENCOUNTER — Telehealth: Payer: Self-pay | Admitting: Internal Medicine

## 2024-11-19 NOTE — Telephone Encounter (Signed)
 Need letter stating she can keep her dryer in apartment for medical reason. (Knees, back) cannot go up and down stairs and hard to walk.

## 2024-11-19 NOTE — Telephone Encounter (Unsigned)
 Copied from CRM #[id number]. Topic: General - Other >> Nov 19, 2024 11:28 AM Avram MATSU wrote: Reason for CRM: patient needs her forms for her dryer to be faxed by tomorrow.   [phone]

## 2024-11-20 NOTE — Telephone Encounter (Signed)
"  faxed  "

## 2024-11-21 ENCOUNTER — Other Ambulatory Visit: Payer: Self-pay

## 2024-11-21 ENCOUNTER — Ambulatory Visit (INDEPENDENT_AMBULATORY_CARE_PROVIDER_SITE_OTHER): Payer: MEDICAID | Admitting: Internal Medicine

## 2024-11-21 ENCOUNTER — Encounter: Payer: Self-pay | Admitting: Internal Medicine

## 2024-11-21 VITALS — BP 128/84 | HR 94 | Temp 97.8°F | Resp 16 | Ht 66.0 in | Wt 324.1 lb

## 2024-11-21 DIAGNOSIS — R1032 Left lower quadrant pain: Secondary | ICD-10-CM | POA: Diagnosis not present

## 2024-11-21 DIAGNOSIS — Z7985 Long-term (current) use of injectable non-insulin antidiabetic drugs: Secondary | ICD-10-CM

## 2024-11-21 DIAGNOSIS — J4520 Mild intermittent asthma, uncomplicated: Secondary | ICD-10-CM | POA: Diagnosis not present

## 2024-11-21 DIAGNOSIS — I10 Essential (primary) hypertension: Secondary | ICD-10-CM

## 2024-11-21 DIAGNOSIS — E1142 Type 2 diabetes mellitus with diabetic polyneuropathy: Secondary | ICD-10-CM | POA: Diagnosis not present

## 2024-11-21 DIAGNOSIS — Z23 Encounter for immunization: Secondary | ICD-10-CM | POA: Diagnosis not present

## 2024-11-21 LAB — POCT GLYCOSYLATED HEMOGLOBIN (HGB A1C): Hemoglobin A1C: 8.4 % — AB (ref 4.0–5.6)

## 2024-11-21 MED ORDER — LANCET DEVICE MISC: 1.0000 | 0 refills | Status: AC

## 2024-11-21 MED ORDER — ALBUTEROL SULFATE HFA 108 (90 BASE) MCG/ACT IN AERS: 2.0000 | INHALATION_SPRAY | RESPIRATORY_TRACT | 0 refills | Status: AC | PRN

## 2024-11-21 MED ORDER — BLOOD GLUCOSE TEST VI STRP: 1.0000 | ORAL_STRIP | 0 refills | Status: AC

## 2024-11-21 MED ORDER — SEMAGLUTIDE (2 MG/DOSE) 8 MG/3ML ~~LOC~~ SOPN: 2.0000 mg | PEN_INJECTOR | SUBCUTANEOUS | 1 refills | Status: AC

## 2024-11-21 MED ORDER — BLOOD GLUCOSE MONITORING SUPPL DEVI: 1.0000 | 0 refills | Status: AC

## 2024-11-21 MED ORDER — LANCETS MISC: 1.0000 | 0 refills | Status: AC

## 2024-11-21 NOTE — Progress Notes (Signed)
 "  Established Patient Office Visit  Subjective:  Patient ID: Brenda Hicks, female    DOB: 05-24-1966  Age: 58 y.o. MRN: [id number]  CC:  Chief Complaint  Patient presents with   Medical Management of Chronic Issues    HPI Brenda Hicks presents for follow up on diabetes.    Discussed the use of AI scribe software for clinical note transcription with the patient, who gave verbal consent to proceed.  History of Present Illness  Brenda JONIAH Hicks is a 58 year old female with diabetes who presents for a diabetes follow-up.  She is on Ozempic  1 mg weekly on Sundays but feels it is less effective. Her HbA1c improved from 8.9% in September to 8.4% currently. She has one dose remaining. Her glucose monitor is malfunctioning despite a new battery, and she is unable to obtain reliable home readings.  She has diverticulitis with intermittent bloating abdominal discomfort. A recent flare was treated with leftover Augmentin  with partial relief. A prior CT showed mild diverticulitis.  She had knee replacement surgery 2 years ago and has persistent pain behind the knee and decreased sensation down the opposite side of the leg to the toes since surgery.  She has insomnia. She occasionally takes trazodone , which does not help and causes stomach discomfort. She wakes during the night and has difficulty returning to sleep.  She is out of her albuterol  inhaler, which she uses for wheezing, especially in cold weather.  She has not received a recent flu vaccine due to prior adverse reactions. She received a pneumonia vaccine in 2014.   Hypertension, OSA: -Medications: Currently on Lisinopril  40 mg  -Patient is compliant with medications and reports no side effects.  -Checking BP at home: higher than usual, more stress and not slepeing -Denies any SOB, CP, LE edema, medication SEs, or symptoms of hypotension   Diabetes, Type 2 -Last A1c 8.9% 9/25 -Medications: Ozempic  1 mg - did have a course of  steroids last month when she was diagnosed with diverticulitis  -Diet: appetite decreased, working on eating healthy foods -Exercise: None right now due to knee pain -Blood sugars: no more low's -Eye exam: UTD -Foot exam: UTD -Microalbumin: UTD -Statin: yes -PNA vaccine: Prevnar 23 in 2014, Prevnar 20 today  Health Maintenance: -Blood work UTD -Mammogram 3/25 Birads-1  Past Medical History:  Diagnosis Date   Anemia    vitamin d deficiency   Anxiety    Arthritis    Asthma    WELL CONTROLLED   Hicks of ear    skin Hicks left ear   COPD (chronic obstructive pulmonary disease) (HCC)    Depression    Diabetes mellitus without complication (HCC)    Fatty liver    Hypertension    Kidney cysts    per patient, never had   Renal disorder    Sleep apnea    DOES NOT USE CPAP. waiting for new machine and a new sleep study   Stroke [facility]) May or June 2019   TIA. no residual symptoms    Past Surgical History:  Procedure Laterality Date   ABDOMINAL HYSTERECTOMY     ANTERIOR CERVICAL DECOMP/DISCECTOMY FUSION N/A 08/09/2018   Procedure: ANTERIOR CERVICAL DECOMPRESSION/DISCECTOMY FUSION 1 LEVEL- C4-5;  Surgeon: Clois Fret, MD;  Location: [facility] OR[facility];  Service: Neurosurgery;  Laterality: N/A;   BACK SURGERY     NECK   COLONOSCOPY WITH PROPOFOL  N/A 02/26/2021   Procedure: COLONOSCOPY WITH PROPOFOL ;  Surgeon: Therisa Bi, MD;  Location[facility]  ENDOSCOPY;  Service: Endoscopy;  Laterality: N/A;   DILATATION & CURETTAGE/HYSTEROSCOPY WITH MYOSURE N/A 01/19/2021   Procedure: DILATATION & CURETTAGE/HYSTEROSCOPY;  Surgeon: Connell Davies, MD;  Location: [facility] OR[facility];  Service: Gynecology;  Laterality: N/A;   DILATION AND CURETTAGE OF UTERUS     ENDOMETRIAL ABLATION N/A 01/19/2021   Procedure: ENDOMETRIAL ABLATION, MINERVA;  Surgeon: Connell Davies, MD;  Location: [facility];  Service: Gynecology;  Laterality: N/A;   ENDOMETRIAL BIOPSY     benign   ESOPHAGOGASTRODUODENOSCOPY (EGD) WITH PROPOFOL   N/A 08/24/2022   Procedure: ESOPHAGOGASTRODUODENOSCOPY (EGD) WITH PROPOFOL ;  Surgeon: Unk Corinn Skiff, MD;  Location: [facility] ENDOSCOPY;  Service: Gastroenterology;  Laterality: N/A;   EXPLORATORY LAPAROTOMY  1992   REMOVAL OF RUPTURED ECTOPIC   HAND SURGERY Right 1998   cyst removed   HERNIA REPAIR  1975   UMBILICAL   JOINT REPLACEMENT Right 2014   TKR   KNEE ARTHROSCOPY Right 2012   KNEE SURGERY Right 2014   total knee replacement   ROBOTIC ASSISTED LAPAROSCOPIC HYSTERECTOMY AND SALPINGECTOMY Bilateral 03/15/2022   Procedure: XI ROBOTIC ASSISTED LAPAROSCOPIC HYSTERECTOMY;  Surgeon: Connell Davies, MD;  Location: [facility];  Service: Gynecology;  Laterality: Bilateral;   SHOULDER ARTHROSCOPY WITH BICEPSTENOTOMY Left 12/14/2016   Procedure: SHOULDER ARTHROSCOPY WITH BICEPSTENOTOMY;  Surgeon: Norleen JINNY Maltos, MD;  Location: [facility];  Service: Orthopedics;  Laterality: Left;   SHOULDER ARTHROSCOPY WITH OPEN ROTATOR CUFF REPAIR Left 12/14/2016   Procedure: SHOULDER ARTHROSCOPY WITH OPEN ROTATOR CUFF REPAIR AND ARTHROSCOPIC ROTATOR CUFF REPAIR;  Surgeon: Norleen JINNY Maltos, MD;  Location: [facility];  Service: Orthopedics;  Laterality: Left;   SHOULDER ARTHROSCOPY WITH ROTATOR CUFF REPAIR Right 01/04/2019   Procedure: SHOULDER ARTHROSCOPY WITH ROTATOR CUFF REPAIR;  Surgeon: Maltos Norleen JINNY, MD;  Location: [facility];  Service: Orthopedics;  Laterality: Right;   SHOULDER ARTHROSCOPY WITH SUBACROMIAL DECOMPRESSION Left 12/14/2016   Procedure: SHOULDER ARTHROSCOPY WITH SUBACROMIAL DECOMPRESSION;  Surgeon: Norleen JINNY Maltos, MD;  Location: [facility];  Service: Orthopedics;  Laterality: Left;   SHOULDER ARTHROSCOPY WITH SUBACROMIAL DECOMPRESSION AND BICEP TENDON REPAIR Right 01/04/2019   Procedure: SHOULDER ARTHROSCOPY WITH DEBRIDEMENT AND SUBACROMIAL DECOMPRESSION-RIGHT;  Surgeon: Maltos Norleen JINNY, MD;  Location: [facility];  Service: Orthopedics;  Laterality: Right;   TOTAL KNEE ARTHROPLASTY Left 04/26/2023   Procedure: TOTAL  KNEE ARTHROPLASTY;  Surgeon: Maltos Norleen JINNY, MD;  Location: [facility];  Service: Orthopedics;  Laterality: Left;   TUBAL LIGATION      Family History  Problem Relation Age of Onset   Hypertension Mother    Thyroid  disease Mother    Lupus Mother    Congestive Heart Failure Mother    Hypertension Brother    CAD Maternal Grandmother    Breast Hicks Neg Hx    Ovarian Hicks Neg Hx    Colon Hicks Neg Hx     Social History   Socioeconomic History   Marital status: Married    Spouse name: brian   Number of children: 7   Years of education: Not on file   Highest education level: 11th grade  Occupational History   Occupation: disabled    Comment: disabled  Tobacco Use   Smoking status: Former    Current packs/day: 0.00    Average packs/day: 2.0 packs/day for 25.0 years (50.0 ttl pk-yrs)    Types: Cigarettes    Start date: 06/09/1992    Quit date: 06/09/2017    Years since quitting: 7.4   Smokeless tobacco: Never  Vaping Use  Vaping status: Never Used  Substance and Sexual Activity   Alcohol use: No   Drug use: Yes    Frequency: 7.0 times per week    Types: Marijuana    Comment: pt states as needed when she is hurting real bad   Sexual activity: Yes    Partners: Male    Birth control/protection: None  Other Topics Concern   Not on file  Social History Narrative   Have 6 biological childrean and custody of niece since 75 months.   Social Drivers of Health   Tobacco Use: Medium Risk (11/21/2024)   Patient History    Smoking Tobacco Use: Former    Smokeless Tobacco Use: Never    Passive Exposure: Not on file  Financial Resource Strain: Medium Risk (11/17/2024)   Overall Financial Resource Strain (CARDIA)    Difficulty of Paying Living Expenses: Somewhat hard  Food Insecurity: Food Insecurity Present (11/17/2024)   Epic    Worried About Programme Researcher, Broadcasting/film/video in the Last Year: Sometimes true    Ran Out of Food in the Last Year: Sometimes true  Transportation Needs:  Unmet Transportation Needs (11/17/2024)   Epic    Lack of Transportation (Medical): Yes    Lack of Transportation (Non-Medical): Yes  Physical Activity: Unknown (02/16/2023)   Exercise Vital Sign    Days of Exercise per Week: Patient declined    Minutes of Exercise per Session: Not on file  Stress: Stress Concern Present (11/17/2024)   [employer] of Occupational Health - Occupational Stress Questionnaire    Feeling of Stress: Rather much  Social Connections: Unknown (11/17/2024)   Social Connection and Isolation Panel    Frequency of Communication with Friends and Family: More than three times a week    Frequency of Social Gatherings with Friends and Family: Never    Attends Religious Services: More than 4 times per year    Active Member of [employer] or Organizations: Patient declined    Attends Banker Meetings: Not on file    Marital Status: Married  Intimate Partner Violence: Not At Risk (04/26/2023)   Humiliation, Afraid, Rape, and Kick questionnaire    Fear of Current or Ex-Partner: No    Emotionally Abused: No    Physically Abused: No    Sexually Abused: No  Depression (PHQ2-9): Low Risk (08/10/2024)   Depression (PHQ2-9)    PHQ-2 Score: 0  Alcohol Screen: Low Risk (08/10/2024)   Alcohol Screen    Last Alcohol Screening Score (AUDIT): 0  Housing: High Risk (11/17/2024)   Epic    Unable to Pay for Housing in the Last Year: Yes    Number of Times Moved in the Last Year: 0    Homeless in the Last Year: No  Utilities: Not At Risk (07/03/2024)   Received from [facility]   Epic    In the past 12 months has the electric, gas, oil, or water company threatened to shut off services in your home?: No  Health Literacy: Not on file    Outpatient Medications Prior to Visit  Medication Sig Dispense Refill   acetaminophen  (TYLENOL ) 500 MG tablet Take 2 tablets (1,000 mg total) by mouth every 6 (six) hours as needed. 100 tablet 2   albuterol  (PROVENTIL )  (2.5 MG/3ML) 0.083% nebulizer solution Take 3 mLs (2.5 mg total) by nebulization every 6 (six) hours as needed for wheezing or shortness of breath. 75 mL 1   albuterol  (VENTOLIN  HFA) 108 (90 Base) MCG/ACT inhaler  Inhale 2 puffs into the lungs every 4 (four) hours as needed. 8 g 0   dicyclomine  (BENTYL ) 20 MG tablet Take 1 tablet (20 mg total) by mouth every 6 (six) hours as needed for up to 5 days for spasms. 15 tablet 0   Glucagon  (GVOKE HYPOPEN  1-PACK) 1 MG/0.2ML SOAJ Inject 1 mg into the skin as needed (hypogylcemia).     lisinopril  (ZESTRIL ) 40 MG tablet Take 1 tablet (40 mg total) by mouth at bedtime. 90 tablet 1   oxyCODONE  (OXY IR/ROXICODONE ) 5 MG immediate release tablet Take 1 tablet (5 mg total) by mouth every 6 (six) hours as needed. 15 tablet 0   predniSONE  (DELTASONE ) 10 MG tablet Take 3 tablets once a day for 5 days 15 tablet 0   Semaglutide , 1 MG/DOSE, (OZEMPIC , 1 MG/DOSE,) 4 MG/3ML SOPN INJECT 1 MG ONCE A WEEK AS DIRECTED 3 mL 0   tiZANidine  (ZANAFLEX ) 4 MG tablet Take 4 mg by mouth 3 (three) times daily.     traZODone  (DESYREL ) 50 MG tablet Take 0.5-1 tablets (25-50 mg total) by mouth at bedtime as needed for sleep. 30 tablet 3   glucose blood (ACCU-CHEK GUIDE) test strip Test blood sugar tid, Dx E11.65 100 each 12   No facility-administered medications prior to visit.    Allergies  Allergen Reactions   Dilaudid  [Hydromorphone  Hcl] Nausea And Vomiting   Cymbalta  [Duloxetine  Hcl] Nausea And Vomiting   Morphine  And Codeine Nausea And Vomiting   Tramadol  Nausea Only    If she takes antinausea medicine with this medicine, then she can tolerate it.    ROS Review of Systems  Musculoskeletal:  Positive for back pain.  All other systems reviewed and are negative.     Objective:    Physical Exam Constitutional:      Appearance: Normal appearance. She is obese.  HENT:     Head: Normocephalic and atraumatic.  Eyes:     Conjunctiva/sclera: Conjunctivae normal.   Cardiovascular:     Rate and Rhythm: Normal rate and regular rhythm.  Pulmonary:     Effort: Pulmonary effort is normal.     Breath sounds: Wheezing present.  Skin:    General: Skin is warm and dry.  Neurological:     General: No focal deficit present.     Mental Status: She is alert. Mental status is at baseline.  Psychiatric:        Mood and Affect: Mood normal.        Behavior: Behavior normal.     BP 128/84 (Cuff Size: Large)   Pulse 94   Temp 97.8 F (36.6 C) (Oral)   Resp 16   Ht 5' 6 (1.676 m)   Wt (!) 324 lb 1.6 oz (147 kg)   LMP 02/22/2022   SpO2 96%   BMI 52.31 kg/m  Wt Readings from Last 3 Encounters:  11/21/24 (!) 324 lb 1.6 oz (147 kg)  10/20/24 300 lb (136.1 kg)  09/28/24 (!) 321 lb 14 oz (146 kg)     Health Maintenance Due  Topic Date Due   Hepatitis B Vaccines 19-59 Average Risk (1 of 3 - 19+ 3-dose series) Never done   Pneumococcal Vaccine: 50+ Years (2 of 2 - PCV) 09/12/2014   Zoster Vaccines- Shingrix (1 of 2) Never done   Lung Hicks Screening  01/29/2023   OPHTHALMOLOGY EXAM  03/05/2023   Influenza Vaccine  06/22/2024   COVID-19 Vaccine (6 - 2025-26 season) 07/23/2024  Topic Date Due   Hepatitis B Vaccines 19-59 Average Risk (1 of 3 - 19+ 3-dose series) Never done    Lab Results  Component Value Date   TSH 1.614 03/15/2022   Lab Results  Component Value Date   WBC 6.0 10/20/2024   HGB 13.4 10/20/2024   HCT 42.5 10/20/2024   MCV 92.8 10/20/2024   PLT 217 10/20/2024   Lab Results  Component Value Date   NA 138 10/20/2024   K 4.2 10/20/2024   CO2 27 10/20/2024   GLUCOSE 151 (H) 10/20/2024   BUN 11 10/20/2024   CREATININE 0.99 10/20/2024   BILITOT 0.6 09/28/2024   ALKPHOS 71 09/28/2024   AST 26 09/28/2024   ALT 26 09/28/2024   PROT 7.8 09/28/2024   ALBUMIN  3.6 09/28/2024   CALCIUM  8.8 (L) 10/20/2024   ANIONGAP 10 10/20/2024   EGFR 79 01/18/2022   Lab Results  Component Value Date   CHOL 185 12/14/2021   Lab  Results  Component Value Date   HDL 41 (L) 12/14/2021   Lab Results  Component Value Date   LDLCALC 122 (H) 12/14/2021   Lab Results  Component Value Date   TRIG 109 12/14/2021   Lab Results  Component Value Date   CHOLHDL 4.5 12/14/2021   Lab Results  Component Value Date   HGBA1C 8.4 (A) 11/21/2024      Assessment & Plan:   Assessment & Plan  Type 2 diabetes mellitus w/Neuropathy  A1c improved to 8.4 from 8.9 but remains above target. Current Ozempic  dose insufficient. - Increased Ozempic  to 2 mg weekly. - Sent prescription for 6 months of Ozempic  2 mg to pharmacy. - Prescribed new blood glucose monitoring kit.  Essential hypertension Well-controlled with BP 128/84 mmHg. - Continue Lisinopril  40 mg.   Recent Episode of Diverticulitis of large intestine Mild diverticulitis with intermittent flares, no complications. - Advised bland diet during flares. - Recommended increasing dietary fiber to prevent future flares. - Instructed to monitor for symptoms of constant pain or fever and report if she occurs.  Mild intermittent asthma Out of albuterol  inhaler. - Prescribed albuterol  inhaler.  General Health Maintenance Discussed pneumonia vaccine due to increased risk with diabetes. Previous vaccine in 2014. - Administered pneumonia vaccine booster.  - POCT HgB A1C - Semaglutide , 2 MG/DOSE, 8 MG/3ML SOPN; Inject 2 mg as directed once a week.  Dispense: 6 mL; Refill: 1 - Blood Glucose Monitoring Suppl DEVI; 1 each by Does not apply route as directed. Dispense based on patient and insurance preference. Use up to four times daily as directed. (FOR ICD-10 E10.9, E11.9).  Dispense: 1 each; Refill: 0 - Glucose Blood (BLOOD GLUCOSE TEST STRIPS) STRP; 1 each by Does not apply route as directed. Dispense based on patient and insurance preference. Use up to four times daily as directed. (FOR ICD-10 E10.9, E11.9).  Dispense: 100 strip; Refill: 0 - Lancet Device MISC; 1 each by  Does not apply route as directed. Dispense based on patient and insurance preference. Use up to four times daily as directed. (FOR ICD-10 E10.9, E11.9).  Dispense: 1 each; Refill: 0 - Lancets MISC; 1 each by Does not apply route as directed. Dispense based on patient and insurance preference. Use up to four times daily as directed. (FOR ICD-10 E10.9, E11.9).  Dispense: 100 each; Refill: 0 - albuterol  (VENTOLIN  HFA) 108 (90 Base) MCG/ACT inhaler; Inhale 2 puffs into the lungs every 4 (four) hours as needed.  Dispense: 8 g; Refill: 0 -  Pneumococcal conjugate vaccine 20-valent (Prevnar 20)   Follow-up: Return in about 3 months (around 02/19/2025) for follow up on a1c.    Sharyle Fischer, DO "

## 2025-02-19 ENCOUNTER — Ambulatory Visit: Payer: MEDICAID | Admitting: Internal Medicine
